# Patient Record
Sex: Male | Born: 1955
Health system: Southern US, Community
[De-identification: ages and names within clinical notes are randomized; demographics above are authoritative.]

## PROBLEM LIST (undated history)

## (undated) DIAGNOSIS — K5790 Diverticulosis of intestine, part unspecified, without perforation or abscess without bleeding: Secondary | ICD-10-CM

## (undated) DIAGNOSIS — M51369 Other intervertebral disc degeneration, lumbar region without mention of lumbar back pain or lower extremity pain: Secondary | ICD-10-CM

## (undated) DIAGNOSIS — I219 Acute myocardial infarction, unspecified: Secondary | ICD-10-CM

## (undated) DIAGNOSIS — R868 Other abnormal findings in specimens from male genital organs: Secondary | ICD-10-CM

## (undated) DIAGNOSIS — M5136 Other intervertebral disc degeneration, lumbar region: Secondary | ICD-10-CM

## (undated) DIAGNOSIS — R7303 Prediabetes: Secondary | ICD-10-CM

## (undated) DIAGNOSIS — K219 Gastro-esophageal reflux disease without esophagitis: Secondary | ICD-10-CM

## (undated) DIAGNOSIS — M1990 Unspecified osteoarthritis, unspecified site: Secondary | ICD-10-CM

## (undated) DIAGNOSIS — E785 Hyperlipidemia, unspecified: Secondary | ICD-10-CM

## (undated) DIAGNOSIS — I499 Cardiac arrhythmia, unspecified: Secondary | ICD-10-CM

## (undated) DIAGNOSIS — G4730 Sleep apnea, unspecified: Secondary | ICD-10-CM

## (undated) DIAGNOSIS — I471 Supraventricular tachycardia, unspecified: Secondary | ICD-10-CM

## (undated) HISTORY — DX: Other intervertebral disc degeneration, lumbar region without mention of lumbar back pain or lower extremity pain: M51.369

## (undated) HISTORY — PX: CARDIAC ELECTROPHYSIOLOGY STUDY AND ABLATION: SHX1294

## (undated) HISTORY — DX: Hyperlipidemia, unspecified: E78.5

## (undated) HISTORY — DX: Other abnormal findings in specimens from male genital organs: R86.8

## (undated) HISTORY — DX: Gastro-esophageal reflux disease without esophagitis: K21.9

## (undated) HISTORY — DX: Diverticulosis of intestine, part unspecified, without perforation or abscess without bleeding: K57.90

## (undated) HISTORY — PX: WISDOM TOOTH EXTRACTION: SHX21

## (undated) HISTORY — PX: BACK SURGERY: SHX140

## (undated) HISTORY — DX: Other intervertebral disc degeneration, lumbar region: M51.36

---

## 1998-02-15 ENCOUNTER — Emergency Department (HOSPITAL_COMMUNITY): Admission: EM | Admit: 1998-02-15 | Discharge: 1998-02-15 | Payer: Self-pay | Admitting: Emergency Medicine

## 1998-02-24 ENCOUNTER — Encounter: Admission: RE | Admit: 1998-02-24 | Discharge: 1998-05-25 | Payer: Self-pay | Admitting: Orthopedic Surgery

## 1998-03-17 ENCOUNTER — Encounter: Admission: RE | Admit: 1998-03-17 | Discharge: 1998-06-15 | Payer: Self-pay | Admitting: Orthopaedic Surgery

## 2011-11-02 ENCOUNTER — Emergency Department (INDEPENDENT_AMBULATORY_CARE_PROVIDER_SITE_OTHER): Payer: BC Managed Care – PPO

## 2011-11-02 ENCOUNTER — Other Ambulatory Visit: Payer: Self-pay

## 2011-11-02 ENCOUNTER — Encounter (HOSPITAL_BASED_OUTPATIENT_CLINIC_OR_DEPARTMENT_OTHER): Payer: Self-pay | Admitting: Emergency Medicine

## 2011-11-02 ENCOUNTER — Emergency Department (HOSPITAL_BASED_OUTPATIENT_CLINIC_OR_DEPARTMENT_OTHER)
Admission: EM | Admit: 2011-11-02 | Discharge: 2011-11-03 | Disposition: A | Discharge status: Home / Self Care | Payer: BC Managed Care – PPO | Attending: Emergency Medicine | Admitting: Emergency Medicine

## 2011-11-02 DIAGNOSIS — Z7982 Long term (current) use of aspirin: Secondary | ICD-10-CM | POA: Insufficient documentation

## 2011-11-02 DIAGNOSIS — R002 Palpitations: Secondary | ICD-10-CM | POA: Insufficient documentation

## 2011-11-02 DIAGNOSIS — R079 Chest pain, unspecified: Secondary | ICD-10-CM | POA: Insufficient documentation

## 2011-11-02 DIAGNOSIS — I498 Other specified cardiac arrhythmias: Secondary | ICD-10-CM | POA: Insufficient documentation

## 2011-11-02 DIAGNOSIS — R0602 Shortness of breath: Secondary | ICD-10-CM | POA: Insufficient documentation

## 2011-11-02 DIAGNOSIS — Z79899 Other long term (current) drug therapy: Secondary | ICD-10-CM | POA: Insufficient documentation

## 2011-11-02 DIAGNOSIS — I471 Supraventricular tachycardia: Secondary | ICD-10-CM

## 2011-11-02 DIAGNOSIS — R42 Dizziness and giddiness: Secondary | ICD-10-CM | POA: Insufficient documentation

## 2011-11-02 LAB — COMPREHENSIVE METABOLIC PANEL
ALT: 22 U/L (ref 0–53)
AST: 22 U/L (ref 0–37)
Alkaline Phosphatase: 115 U/L (ref 39–117)
CO2: 27 mEq/L (ref 19–32)
GFR calc Af Amer: >90 mL/min (ref >90)
Glucose, Bld: 121 mg/dL — ABNORMAL HIGH (ref 70–99)
Potassium: 3.8 mEq/L (ref 3.5–5.1)
Sodium: 139 mEq/L (ref 135–145)
Total Protein: 7.7 g/dL (ref 6.0–8.3)

## 2011-11-02 LAB — CBC
Platelets: 175 10*3/uL (ref 150–400)
RBC: 5.79 MIL/uL (ref 4.22–5.81)
WBC: 8.3 10*3/uL (ref 4.0–10.5)

## 2011-11-02 LAB — DIFFERENTIAL
Basophils Absolute: 0 10*3/uL (ref 0.0–0.1)
Lymphocytes Relative: 44 % (ref 12–46)
Lymphs Abs: 3.7 10*3/uL (ref 0.7–4.0)
Neutrophils Relative %: 42 % — ABNORMAL LOW (ref 43–77)

## 2011-11-02 LAB — TROPONIN I: Troponin I: <0.3 ng/mL (ref <0.30)

## 2011-11-02 MED ORDER — METOPROLOL TARTRATE 12.5 MG HALF TABLET: 12.5000 mg | ORAL_TABLET | ORAL | Status: DC | PRN

## 2011-11-02 NOTE — ED Notes (Signed)
Pt states that he was at home exercising when he began to have pain in his chest and felt like heart was racing, pt took [employer], tums, and 8 baby asa prior to coming to ED. Pt states that he was nauseated and diaphoretic when episode was going on but complaints have now subsided. Pt is now SR on monitor, denies any CP or SOB.

## 2011-11-02 NOTE — ED Notes (Signed)
MD at bedside. 

## 2011-11-02 NOTE — ED Notes (Signed)
After sticking pt for IV, pt stated he felt better, HR on monitor 92 now.

## 2011-11-02 NOTE — ED Provider Notes (Addendum)
History     CSN: [id number]  Arrival date & time 11/02/11  2210   First MD Initiated Contact with Patient 11/02/11 2226      Chief Complaint  Patient presents with  . Chest Pain  . Shortness of Breath    (Consider location/radiation/quality/duration/timing/severity/associated sxs/prior treatment) Patient is a 56 y.o. male presenting with palpitations. The history is provided by the patient.  Palpitations  This is a new problem. The current episode started 1 to 2 hours ago. The problem occurs constantly. The problem has not changed since onset.The problem is associated with exercise, caffeine and stress. On average, each episode lasts 1 hour. Associated symptoms include dizziness and shortness of breath. Pertinent negatives include no diaphoresis, no fever, no chest pain, no nausea and no vomiting. He has tried deep relaxation and breathing exercises for the symptoms. The treatment provided no relief. Risk factors include no known risk factors. Past medical history comments: Patient states he's had these episodes before in the past but they always resolve when he rested.    Past Medical History  Diagnosis Date  . Hyperlipemia     History reviewed. No pertinent past surgical history.  No family history on file.  History  Substance Use Topics  . Smoking status: Never Smoker   . Smokeless tobacco: Not on file  . Alcohol Use: No      Review of Systems  Constitutional: Negative for fever and diaphoresis.  Respiratory: Positive for shortness of breath.   Cardiovascular: Positive for palpitations. Negative for chest pain.  Gastrointestinal: Negative for nausea and vomiting.  Neurological: Positive for dizziness.  All other systems reviewed and are negative.    Allergies  Review of patient's allergies indicates no known allergies.  Home Medications   Current Outpatient Rx  Name Route Sig Dispense Refill  . ASPIRIN 81 MG PO TABS Oral Take 648 mg by mouth once.    .  IBUPROFEN 200 MG PO TABS Oral Take 400-600 mg by mouth every 6 (six) hours as needed. For pain    . ROSUVASTATIN CALCIUM 40 MG PO TABS Oral Take 40 mg by mouth daily.      BP 109/88  Pulse 198  Temp(Src) 97.7 F (36.5 C) (Oral)  Resp 18  Wt 248 lb (112.492 kg)  SpO2 100%  Physical Exam  Nursing note and vitals reviewed. Constitutional: He is oriented to person, place, and time. He appears well-developed and well-nourished. No distress.  HENT:  Head: Normocephalic and atraumatic.  Mouth/Throat: Oropharynx is clear and moist.  Eyes: Conjunctivae and EOM are normal. Pupils are equal, round, and reactive to light.  Neck: Normal range of motion. Neck supple.  Cardiovascular: Normal rate, regular rhythm, normal heart sounds and intact distal pulses.   No murmur heard.      When I went to examine the patient his heart rate had returned to normal at a rate of 85  Pulmonary/Chest: Effort normal and breath sounds normal. No respiratory distress. He has no wheezes. He has no rales.  Abdominal: Soft. He exhibits no distension. There is no tenderness. There is no rebound and no guarding.  Musculoskeletal: Normal range of motion. He exhibits no edema and no tenderness.  Neurological: He is alert and oriented to person, place, and time.  Skin: Skin is warm and dry. No rash noted. No erythema.  Psychiatric: He has a normal mood and affect. His behavior is normal.    ED Course  Procedures (including critical care time)  Labs Reviewed -  No data to display No results found.   Date: 11/02/2011  Rate: 85  Rhythm: normal sinus rhythm  QRS Axis: normal  Intervals: normal  ST/T Wave abnormalities: normal  Conduction Disutrbances:none  Narrative Interpretation:   Old EKG Reviewed: none available    1. SVT (supraventricular tachycardia)       MDM   Patient with palpitations today with a heart rate in the 190s and on the monitor appeared to be in SVT however when the nurse was putting  the IV and he vagaled and his symptoms resolved.  By the time EKG was done ER he converted to normal sinus rhythm. Patient he has had 2-3 episodes of this in his life but they usually would resolve by him taking deep breaths and resting. Tonight the symptoms did not resolve and not to let him to come here. Most likely these prior episodes were also SVT. Patient states he feels normal now he denies any chest pain or shortness of breath. EKG, chest x-ray, cardiac enzymes, CBC and BMP are all within normal limits. Will give patient metoprolol to take if symptoms recur and follow up with cardiology.        Gwyneth Sprout, MD 11/02/11 1610  Gwyneth Sprout, MD 11/02/11 2337

## 2011-11-02 NOTE — ED Notes (Signed)
Pt was exercising at home with sudden onset of tachycardia and shob.

## 2011-11-02 NOTE — ED Notes (Signed)
Patient transported to X-ray 

## 2012-11-10 ENCOUNTER — Telehealth: Payer: Self-pay | Admitting: *Deleted

## 2012-11-10 NOTE — Telephone Encounter (Signed)
Richard Walker's wife called for her husband and said you referred her husband to a specialist in [place] and they were wanting to know if you can refer him to someone either in [address] or [place]  Thanks [place]

## 2013-04-09 ENCOUNTER — Encounter: Payer: Self-pay | Admitting: Family Medicine

## 2013-04-09 ENCOUNTER — Ambulatory Visit (INDEPENDENT_AMBULATORY_CARE_PROVIDER_SITE_OTHER): Payer: BC Managed Care – PPO | Admitting: Family Medicine

## 2013-04-09 ENCOUNTER — Other Ambulatory Visit: Payer: Self-pay | Admitting: Family Medicine

## 2013-04-09 VITALS — BP 109/76 | HR 90 | Resp 16 | Ht 68.0 in | Wt 239.0 lb

## 2013-04-09 DIAGNOSIS — I69998 Other sequelae following unspecified cerebrovascular disease: Secondary | ICD-10-CM | POA: Insufficient documentation

## 2013-04-09 DIAGNOSIS — E559 Vitamin D deficiency, unspecified: Secondary | ICD-10-CM | POA: Insufficient documentation

## 2013-04-09 DIAGNOSIS — G4730 Sleep apnea, unspecified: Secondary | ICD-10-CM | POA: Insufficient documentation

## 2013-04-09 DIAGNOSIS — E785 Hyperlipidemia, unspecified: Secondary | ICD-10-CM

## 2013-04-09 DIAGNOSIS — K219 Gastro-esophageal reflux disease without esophagitis: Secondary | ICD-10-CM | POA: Insufficient documentation

## 2013-04-09 DIAGNOSIS — E7800 Pure hypercholesterolemia, unspecified: Secondary | ICD-10-CM | POA: Insufficient documentation

## 2013-04-09 DIAGNOSIS — R197 Diarrhea, unspecified: Secondary | ICD-10-CM

## 2013-04-09 DIAGNOSIS — R1013 Epigastric pain: Secondary | ICD-10-CM

## 2013-04-09 DIAGNOSIS — B181 Chronic viral hepatitis B without delta-agent: Secondary | ICD-10-CM | POA: Insufficient documentation

## 2013-04-09 DIAGNOSIS — R110 Nausea: Secondary | ICD-10-CM

## 2013-04-09 DIAGNOSIS — R7982 Elevated C-reactive protein (CRP): Secondary | ICD-10-CM | POA: Insufficient documentation

## 2013-04-09 DIAGNOSIS — K644 Residual hemorrhoidal skin tags: Secondary | ICD-10-CM | POA: Insufficient documentation

## 2013-04-09 LAB — LIPID PANEL
Cholesterol: 213 mg/dL — ABNORMAL HIGH (ref 0–200)
HDL: 39 mg/dL — ABNORMAL LOW (ref >39)
LDL Cholesterol: 143 mg/dL — ABNORMAL HIGH (ref 0–99)
Total CHOL/HDL Ratio: 5.5 Ratio
Triglycerides: 154 mg/dL — ABNORMAL HIGH (ref <150)
VLDL: 31 mg/dL (ref 0–40)

## 2013-04-09 LAB — CBC WITH DIFFERENTIAL/PLATELET
Basophils Absolute: 0.1 10*3/uL (ref 0.0–0.1)
Basophils Relative: 1 % (ref 0–1)
Eosinophils Absolute: 0.2 10*3/uL (ref 0.0–0.7)
Eosinophils Relative: 2 % (ref 0–5)
HCT: 47.3 % (ref 39.0–52.0)
Hemoglobin: 15.9 g/dL (ref 13.0–17.0)
Lymphocytes Relative: 23 % (ref 12–46)
Lymphs Abs: 1.8 10*3/uL (ref 0.7–4.0)
MCH: 28.9 pg (ref 26.0–34.0)
MCHC: 33.6 g/dL (ref 30.0–36.0)
MCV: 85.8 fL (ref 78.0–100.0)
Monocytes Absolute: 0.6 10*3/uL (ref 0.1–1.0)
Monocytes Relative: 8 % (ref 3–12)
Neutro Abs: 5.1 10*3/uL (ref 1.7–7.7)
Neutrophils Relative %: 66 % (ref 43–77)
Platelets: 188 10*3/uL (ref 150–400)
RBC: 5.51 MIL/uL (ref 4.22–5.81)
RDW: 13.4 % (ref 11.5–15.5)
WBC: 7.7 10*3/uL (ref 4.0–10.5)

## 2013-04-09 LAB — COMPLETE METABOLIC PANEL WITH GFR
ALT: 21 U/L (ref 0–53)
AST: 16 U/L (ref 0–37)
Albumin: 4.4 g/dL (ref 3.5–5.2)
Alkaline Phosphatase: 81 U/L (ref 39–117)
BUN: 10 mg/dL (ref 6–23)
CO2: 27 mEq/L (ref 19–32)
Calcium: 9.4 mg/dL (ref 8.4–10.5)
Chloride: 105 mEq/L (ref 96–112)
Creat: 1.02 mg/dL (ref 0.50–1.35)
GFR, Est African American: >89 mL/min
GFR, Est Non African American: 82 mL/min
Glucose, Bld: 85 mg/dL (ref 70–99)
Potassium: 4.2 mEq/L (ref 3.5–5.3)
Sodium: 138 mEq/L (ref 135–145)
Total Bilirubin: 0.7 mg/dL (ref 0.3–1.2)
Total Protein: 6.9 g/dL (ref 6.0–8.3)

## 2013-04-09 LAB — LIPASE: Lipase: 20 U/L (ref 0–75)

## 2013-04-09 MED ORDER — PROMETHAZINE HCL 12.5 MG PO TABS: ORAL_TABLET | ORAL | Status: DC

## 2013-04-09 MED ORDER — DIPHENOXYLATE-ATROPINE 2.5-0.025 MG PO TABS: ORAL_TABLET | ORAL | Status: DC

## 2013-04-09 MED ORDER — DICYCLOMINE HCL 20 MG PO TABS: ORAL_TABLET | ORAL | Status: DC

## 2013-04-09 NOTE — Patient Instructions (Addendum)
1)  Diarrhea - You most likely have a virus that will pass and you can take medications to help with this.  I would first try some Immodium (OTC) and follow the BRAT Diet.   You can take some of the Bentyl (RX) up to 4 times a day as needed to slow the gut.  If the diarrhea does not improve you can take the prescription (Lomotil).  If you don't seem better in a few day then you can collect the stool samples and return.   2)  Nausea - Take the Phenergan as directed.    Diarrhea Diarrhea is frequent loose and watery bowel movements. It can cause you to feel weak and dehydrated. Dehydration can cause you to become tired and thirsty, have a dry mouth, and have decreased urination that often is dark yellow. Diarrhea is a sign of another problem, most often an infection that will not last long. In most cases, diarrhea typically lasts 2 3 days. However, it can last longer if it is a sign of something more serious. It is important to treat your diarrhea as directed by your caregive to lessen or prevent future episodes of diarrhea. CAUSES  Some common causes include:  Gastrointestinal infections caused by viruses, bacteria, or parasites.  Food poisoning or food allergies.  Certain medicines, such as antibiotics, chemotherapy, and laxatives.  Artificial sweeteners and fructose.  Digestive disorders. HOME CARE INSTRUCTIONS  Ensure adequate fluid intake (hydration): have 1 cup (8 oz) of fluid for each diarrhea episode. Avoid fluids that contain simple sugars or sports drinks, fruit juices, whole milk products, and sodas. Your urine should be clear or pale yellow if you are drinking enough fluids. Hydrate with an oral rehydration solution that you can purchase at pharmacies, retail stores, and online. You can prepare an oral rehydration solution at home by mixing the following ingredients together:    tsp table salt.   tsp baking soda.   tsp salt substitute containing potassium chloride.  1   tablespoons sugar.  1 L (34 oz) of water.  Certain foods and beverages may increase the speed at which food moves through the gastrointestinal (GI) tract. These foods and beverages should be avoided and include:  Caffeinated and alcoholic beverages.  High-fiber foods, such as raw fruits and vegetables, nuts, seeds, and whole grain breads and cereals.  Foods and beverages sweetened with sugar alcohols, such as xylitol, sorbitol, and mannitol.  Some foods may be well tolerated and may help thicken stool including:  Starchy foods, such as rice, toast, pasta, low-sugar cereal, oatmeal, grits, baked potatoes, crackers, and bagels.  Bananas.  Applesauce.  Add probiotic-rich foods to help increase healthy bacteria in the GI tract, such as yogurt and fermented milk products.  Wash your hands well after each diarrhea episode.  Only take over-the-counter or prescription medicines as directed by your caregiver.  Take a warm bath to relieve any burning or pain from frequent diarrhea episodes. SEEK IMMEDIATE MEDICAL CARE IF:   You are unable to keep fluids down.  You have persistent vomiting.  You have blood in your stool, or your stools are black and tarry.  You do not urinate in 6 8 hours, or there is only a small amount of very dark urine.  You have abdominal pain that increases or localizes.  You have weakness, dizziness, confusion, or lightheadedness.  You have a severe headache.  Your diarrhea gets worse or does not get better.  You have a fever or persistent symptoms  for more than 2 3 days.  You have a fever and your symptoms suddenly get worse. MAKE SURE YOU:   Understand these instructions.  Will watch your condition.  Will get help right away if you are not doing well or get worse. Document Released: 07/30/2002 Document Revised: 07/26/2012 Document Reviewed: 04/16/2012 [facility] Patient Information 2014 [place], [place].   Diet for Diarrhea, Adult Frequent,  runny stools (diarrhea) may be caused or worsened by food or drink. Diarrhea may be relieved by changing your diet. Since diarrhea can last up to 7 days, it is easy for you to lose too much fluid from the body and become dehydrated. Fluids that are lost need to be replaced. Along with a modified diet, make sure you drink enough fluids to keep your urine clear or pale yellow. DIET INSTRUCTIONS  Ensure adequate fluid intake (hydration): have 1 cup (8 oz) of fluid for each diarrhea episode. Avoid fluids that contain simple sugars or sports drinks, fruit juices, whole milk products, and sodas. Your urine should be clear or pale yellow if you are drinking enough fluids. Hydrate with an oral rehydration solution that you can purchase at pharmacies, retail stores, and online. You can prepare an oral rehydration solution at home by mixing the following ingredients together:    tsp table salt.   tsp baking soda.   tsp salt substitute containing potassium chloride.  1  tablespoons sugar.  1 L (34 oz) of water.  Certain foods and beverages may increase the speed at which food moves through the gastrointestinal (GI) tract. These foods and beverages should be avoided and include:  Caffeinated and alcoholic beverages.  High-fiber foods, such as raw fruits and vegetables, nuts, seeds, and whole grain breads and cereals.  Foods and beverages sweetened with sugar alcohols, such as xylitol, sorbitol, and mannitol.  Some foods may be well tolerated and may help thicken stool including:  Starchy foods, such as rice, toast, pasta, low-sugar cereal, oatmeal, grits, baked potatoes, crackers, and bagels.   Bananas.   Applesauce.  Add probiotic-rich foods to help increase healthy bacteria in the GI tract, such as yogurt and fermented milk products. RECOMMENDED FOODS AND BEVERAGES Starches Choose foods with less than 2 g of fiber per serving.  Recommended:  White, [place], and pita breads, plain rolls,  buns, bagels. Plain muffins, matzo. Soda, saltine, or graham crackers. Pretzels, melba toast, zwieback. Cooked cereals made with water: cornmeal, farina, cream cereals. Dry cereals: refined corn, wheat, rice. Potatoes prepared any way without skins, refined macaroni, spaghetti, noodles, refined rice.  Avoid:  Bread, rolls, or crackers made with whole wheat, multi-grains, rye, bran seeds, nuts, or coconut. Corn tortillas or taco shells. Cereals containing whole grains, multi-grains, bran, coconut, nuts, raisins. Cooked or dry oatmeal. Coarse wheat cereals, granola. Cereals advertised as "high-fiber." Potato skins. Whole grain pasta, wild or brown rice. Popcorn. Sweet potatoes, yams. Sweet rolls, doughnuts, waffles, pancakes, sweet breads. Vegetables  Recommended: Strained tomato and vegetable juices. Most well-cooked and canned vegetables without seeds. Fresh: Tender lettuce, cucumber without the skin, cabbage, spinach, bean sprouts.  Avoid: Fresh, cooked, or canned: Artichokes, baked beans, beet greens, broccoli, Brussels sprouts, corn, kale, legumes, peas, sweet potatoes. Cooked: Green or red cabbage, spinach. Avoid large servings of any vegetables because vegetables shrink when cooked, and they contain more fiber per serving than fresh vegetables. Fruit  Recommended: Cooked or canned: Apricots, applesauce, cantaloupe, cherries, fruit cocktail, grapefruit, grapes, kiwi, mandarin oranges, peaches, pears, plums, watermelon. Fresh: Apples without skin, ripe  banana, grapes, cantaloupe, cherries, grapefruit, peaches, oranges, plums. Keep servings limited to  cup or 1 piece.  Avoid: Fresh: Apples with skin, apricots, mangoes, pears, raspberries, strawberries. Prune juice, stewed or dried prunes. Dried fruits, raisins, dates. Large servings of all fresh fruits. Protein  Recommended: Ground or well-cooked tender beef, ham, veal, lamb, pork, or poultry. Eggs. Fish, oysters, shrimp, lobster, other seafoods.  Liver, organ meats.  Avoid: Tough, fibrous meats with gristle. Peanut butter, smooth or chunky. Cheese, nuts, seeds, legumes, dried peas, beans, lentils. Dairy  Recommended: Yogurt, lactose-free milk, kefir, drinkable yogurt, buttermilk, soy milk, or plain hard cheese.  Avoid: Milk, chocolate milk, beverages made with milk, such as milkshakes. Soups  Recommended: Bouillon, broth, or soups made from allowed foods. Any strained soup.  Avoid: Soups made from vegetables that are not allowed, cream or milk-based soups. Desserts and Sweets  Recommended: Sugar-free gelatin, sugar-free frozen ice pops made without sugar alcohol.  Avoid: Plain cakes and cookies, pie made with fruit, pudding, custard, cream pie. Gelatin, fruit, ice, sherbet, frozen ice pops. Ice cream, ice milk without nuts. Plain hard candy, honey, jelly, molasses, syrup, sugar, chocolate syrup, gumdrops, marshmallows. Fats and Oils  Recommended: Limit fats to less than 8 tsp per day.  Avoid: Seeds, nuts, olives, avocados. Margarine, butter, cream, mayonnaise, salad oils, plain salad dressings. Plain gravy, crisp bacon without rind. Beverages  Recommended: Water, decaffeinated teas, oral rehydration solutions, sugar-free beverages not sweetened with sugar alcohols.  Avoid: Fruit juices, caffeinated beverages (coffee, tea, soda), alcohol, sports drinks, or lemon-lime soda. Condiments  Recommended: Ketchup, mustard, horseradish, vinegar, cocoa powder. Spices in moderation: allspice, basil, bay leaves, celery powder or leaves, cinnamon, cumin powder, curry powder, ginger, mace, marjoram, onion or garlic powder, oregano, paprika, parsley flakes, ground pepper, rosemary, sage, savory, tarragon, thyme, turmeric.  Avoid: Coconut, honey. Document Released: 10/30/2003 Document Revised: 05/03/2012 Document Reviewed: 12/24/2011 [facility] Patient Information 2014 [place], [place].   B.R.A.T. Diet Your doctor has recommended the  B.R.A.T. diet for you or your child until the condition improves. This is often used to help control diarrhea and vomiting symptoms. If you or your child can tolerate clear liquids, you may have:  Bananas.   Rice.   Applesauce.   Toast (and other simple starches such as crackers, potatoes, noodles).  Be sure to avoid dairy products, meats, and fatty foods until symptoms are better. Fruit juices such as apple, grape, and prune juice can make diarrhea worse. Avoid these. Continue this diet for 2 days or as instructed by your caregiver. Document Released: 08/09/2005 Document Revised: 07/29/2011 Document Reviewed: 01/26/2007 [facility] Patient Information 2012 [place], [place].

## 2013-04-09 NOTE — Progress Notes (Signed)
  Subjective:    Patient ID: Richard Walker, male    DOB: 09-May-1956, 57 y.o.   MRN: [id number]  Richard Walker is here today complaining of diarrhea.   Diarrhea  This is a new problem. The current episode started in the past 7 days. The problem occurs 5 to 10 times per day. The problem has been unchanged. The stool consistency is described as watery. The patient states that diarrhea awakens him from sleep. Associated symptoms include abdominal pain and increased flatus. Associated symptoms comments: belching. Nothing aggravates the symptoms. He has tried anti-motility drug and analgesics (home remedies (corn starch) ) for the symptoms. The treatment provided no relief.     Review of Systems  Constitutional: Negative.   HENT: Negative.   Eyes: Negative.   Respiratory: Negative.   Cardiovascular: Negative.   Gastrointestinal: Positive for abdominal pain, diarrhea and flatus.  Endocrine: Negative.   Genitourinary: Negative.   Musculoskeletal: Negative.   Skin: Negative.   Allergic/Immunologic: Negative.   Neurological: Negative.   Hematological: Negative.   Psychiatric/Behavioral: Negative.      Past Medical History  Diagnosis Date  . Hyperlipidemia   . GERD (gastroesophageal reflux disease)   . DDD (degenerative disc disease), lumbar     L3/L4     Family History  Problem Relation Age of Onset  . Hypertension Father      History   Social History Narrative   Marital Status: Married Engineer, drilling)   Children:  4 (3 Sons/1 Daughter)   Pets:  None    Living Situation: Lives with spouse and 4 children   Origin:  He was born in [place].   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     Alcohol Use:  None   Drug Use:  None   Diet:  Regular   Exercise:  None   Hobbies:  Cards       Objective:   Physical Exam  Vitals reviewed. Constitutional: He is  oriented to person, place, and time. He appears well-developed and well-nourished.  Cardiovascular: Normal rate and regular rhythm.   Pulmonary/Chest: Effort normal and breath sounds normal.  Abdominal: Soft. Bowel sounds are normal. He exhibits no distension and no mass. There is no tenderness.  Neurological: He is alert and oriented to person, place, and time.  Skin: Skin is warm and dry.  Psychiatric: He has a normal mood and affect.          Assessment & Plan:

## 2013-04-10 ENCOUNTER — Ambulatory Visit (INDEPENDENT_AMBULATORY_CARE_PROVIDER_SITE_OTHER): Payer: BC Managed Care – PPO | Admitting: Family Medicine

## 2013-04-10 ENCOUNTER — Encounter: Payer: Self-pay | Admitting: Family Medicine

## 2013-04-10 VITALS — BP 128/83 | HR 68 | Resp 16 | Wt 239.0 lb

## 2013-04-10 DIAGNOSIS — R197 Diarrhea, unspecified: Secondary | ICD-10-CM

## 2013-04-10 DIAGNOSIS — E785 Hyperlipidemia, unspecified: Secondary | ICD-10-CM

## 2013-04-10 LAB — TSH: TSH: 2.611 u[IU]/mL (ref 0.350–4.500)

## 2013-04-10 MED ORDER — ROSUVASTATIN CALCIUM 40 MG PO TABS: ORAL_TABLET | ORAL | Status: DC

## 2013-04-10 NOTE — Patient Instructions (Addendum)
1)  Cholesterol - Start with Co Q 10 100-200 mg per day for one month before you start back on the Crestor.  Then start with 5 mg of Crestor then increase to 10 mg and 20 mg then 40 mg if tolerated.  If when you are on the Crestor and you have muscle pain then we want to check a lab called CPK.     Fat and Cholesterol Control Diet Cholesterol levels in your body are determined significantly by your diet. Cholesterol levels may also be related to heart disease. The following material helps to explain this relationship and discusses what you can do to help keep your heart healthy. Not all cholesterol is bad. Low-density lipoprotein (LDL) cholesterol is the "bad" cholesterol. It may cause fatty deposits to build up inside your arteries. High-density lipoprotein (HDL) cholesterol is "good." It helps to remove the "bad" LDL cholesterol from your blood. Cholesterol is a very important risk factor for heart disease. Other risk factors are high blood pressure, smoking, stress, heredity, and weight. The heart muscle gets its supply of blood through the coronary arteries. If your LDL cholesterol is high and your HDL cholesterol is low, you are at risk for having fatty deposits build up in your coronary arteries. This leaves less room through which blood can flow. Without sufficient blood and oxygen, the heart muscle cannot function properly and you may feel chest pains (angina pectoris). When a coronary artery closes up entirely, a part of the heart muscle may die causing a heart attack (myocardial infarction). CHECKING CHOLESTEROL When your caregiver sends your blood to a lab to be examined for cholesterol, a complete lipid (fat) profile may be done. With this test, the total amount of cholesterol and levels of LDL and HDL are determined. Triglycerides are a type of fat that circulates in the blood. They can also be used to determine heart disease risk. The list below describes what the numbers should be: Test: Total  Cholesterol.  Less than 200 mg/dl. Test: LDL "bad cholesterol."  Less than 100 mg/dl.  Less than 70 mg/dl if you are at very high risk of a heart attack or sudden cardiac death. Test: HDL "good cholesterol."  Greater than 50 mg/dl for women.  Greater than 40 mg/dl for men. Test: Triglycerides.  Less than 150 mg/dl. CONTROLLING CHOLESTEROL WITH DIET Although exercise and lifestyle factors are important, your diet is key. That is because certain foods are known to raise cholesterol and others to lower it. The goal is to balance foods for their effect on cholesterol and more importantly, to replace saturated and trans fat with other types of fat, such as monounsaturated fat, polyunsaturated fat, and omega-3 fatty acids. On average, a person should consume no more than 15 to 17 g of saturated fat daily. Saturated and trans fats are considered "bad" fats, and they will raise LDL cholesterol. Saturated fats are primarily found in animal products such as meats, butter, and cream. However, that does not mean you need to give up all your favorite foods. Today, there are good tasting, low-fat, low-cholesterol substitutes for most of the things you like to eat. Choose low-fat or nonfat alternatives. Choose round or loin cuts of red meat. These types of cuts are lowest in fat and cholesterol. Chicken (without the skin), fish, veal, and ground [place] breast are great choices. Eliminate fatty meats, such as hot dogs and salami. Even shellfish have little or no saturated fat. Have a 3 oz (85 g) portion when  you eat lean meat, poultry, or fish. Trans fats are also called "partially hydrogenated oils." They are oils that have been scientifically manipulated so that they are solid at room temperature resulting in a longer shelf life and improved taste and texture of foods in which they are added. Trans fats are found in stick margarine, some tub margarines, cookies, crackers, and baked goods.  When baking and  cooking, oils are a great substitute for butter. The monounsaturated oils are especially beneficial since it is believed they lower LDL and raise HDL. The oils you should avoid entirely are saturated tropical oils, such as coconut and palm.  Remember to eat a lot from food groups that are naturally free of saturated and trans fat, including fish, fruit, vegetables, beans, grains (barley, rice, couscous, bulgur wheat), and pasta (without cream sauces).  IDENTIFYING FOODS THAT LOWER CHOLESTEROL  Soluble fiber may lower your cholesterol. This type of fiber is found in fruits such as apples, vegetables such as broccoli, potatoes, and carrots, legumes such as beans, peas, and lentils, and grains such as barley. Foods fortified with plant sterols (phytosterol) may also lower cholesterol. You should eat at least 2 g per day of these foods for a cholesterol lowering effect.  Read package labels to identify low-saturated fats, trans fat free, and low-fat foods at the supermarket. Select cheeses that have only 2 to 3 g saturated fat per ounce. Use a heart-healthy tub margarine that is free of trans fats or partially hydrogenated oil. When buying baked goods (cookies, crackers), avoid partially hydrogenated oils. Breads and muffins should be made from whole grains (whole-wheat or whole oat flour, instead of "flour" or "enriched flour"). Buy non-creamy canned soups with reduced salt and no added fats.  FOOD PREPARATION TECHNIQUES  Never deep-fry. If you must fry, either stir-fry, which uses very little fat, or use non-stick cooking sprays. When possible, broil, bake, or roast meats, and steam vegetables. Instead of putting butter or margarine on vegetables, use lemon and herbs, applesauce, and cinnamon (for squash and sweet potatoes), nonfat yogurt, salsa, and low-fat dressings for salads.  LOW-SATURATED FAT / LOW-FAT FOOD SUBSTITUTES Meats / Saturated Fat (g)  Avoid: Steak, marbled (3 oz/85 g) / 11 g  Choose:  Steak, lean (3 oz/85 g) / 4 g  Avoid: Hamburger (3 oz/85 g) / 7 g  Choose: Hamburger, lean (3 oz/85 g) / 5 g  Avoid: Ham (3 oz/85 g) / 6 g  Choose: Ham, lean cut (3 oz/85 g) / 2.4 g  Avoid: Chicken, with skin, dark meat (3 oz/85 g) / 4 g  Choose: Chicken, skin removed, dark meat (3 oz/85 g) / 2 g  Avoid: Chicken, with skin, light meat (3 oz/85 g) / 2.5 g  Choose: Chicken, skin removed, light meat (3 oz/85 g) / 1 g Dairy / Saturated Fat (g)  Avoid: Whole milk (1 cup) / 5 g  Choose: Low-fat milk, 2% (1 cup) / 3 g  Choose: Low-fat milk, 1% (1 cup) / 1.5 g  Choose: Skim milk (1 cup) / 0.3 g  Avoid: Hard cheese (1 oz/28 g) / 6 g  Choose: Skim milk cheese (1 oz/28 g) / 2 to 3 g  Avoid: Cottage cheese, 4% fat (1 cup) / 6.5 g  Choose: Low-fat cottage cheese, 1% fat (1 cup) / 1.5 g  Avoid: Ice cream (1 cup) / 9 g  Choose: Sherbet (1 cup) / 2.5 g  Choose: Nonfat frozen yogurt (1 cup) / 0.3 g  Choose: Frozen fruit bar / trace  Avoid: Whipped cream (1 tbs) / 3.5 g  Choose: Nondairy whipped topping (1 tbs) / 1 g Condiments / Saturated Fat (g)  Avoid: Mayonnaise (1 tbs) / 2 g  Choose: Low-fat mayonnaise (1 tbs) / 1 g  Avoid: Butter (1 tbs) / 7 g  Choose: Extra light margarine (1 tbs) / 1 g  Avoid: Coconut oil (1 tbs) / 11.8 g  Choose: Olive oil (1 tbs) / 1.8 g  Choose: Corn oil (1 tbs) / 1.7 g  Choose: Safflower oil (1 tbs) / 1.2 g  Choose: Sunflower oil (1 tbs) / 1.4 g  Choose: Soybean oil (1 tbs) / 2.4 g  Choose: Canola oil (1 tbs) / 1 g Document Released: 08/09/2005 Document Revised: 11/01/2011 Document Reviewed: 01/28/2011 ExitCare Patient Information 2014 [place], [place].

## 2013-04-10 NOTE — Progress Notes (Signed)
  Subjective:    Patient ID: Richard Walker, male    DOB: 03-01-1956, 57 y.o.   MRN: [id number]  HPI  Richard Walker is here today to go over his recent lab results and to discuss the conditions below:   1)  Hyperlipidemia: He has not been taking his Crestor.    2)  Diarrhea:  His diarrhea has improved.    Review of Systems  Constitutional: Positive for fatigue.       He has been feeling very tired  HENT: Negative.   Eyes: Negative.   Respiratory: Negative.   Cardiovascular: Negative.   Gastrointestinal: Negative.   Endocrine: Negative.   Genitourinary: Negative.   Musculoskeletal: Negative.   Skin: Negative.   Allergic/Immunologic: Negative.   Neurological: Negative.   Hematological: Negative.   Psychiatric/Behavioral: Negative.      Past Medical History  Diagnosis Date  . Hyperlipidemia   . GERD (gastroesophageal reflux disease)   . DDD (degenerative disc disease), lumbar     L3/L4     Family History  Problem Relation Age of Onset  . Hypertension Father      History   Social History Narrative   Marital Status: Married Engineer, drilling)   Children:  4 (3 Sons/1 Daughter)   Pets:  None    Living Situation: Lives with spouse and 4 children   Origin:  He was born in [place].   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     Alcohol Use:  None   Drug Use:  None   Diet:  Regular   Exercise:  None   Hobbies:  Cards       Objective:   Physical Exam  Vitals reviewed. Constitutional: He is oriented to person, place, and time. He appears well-developed and well-nourished.  Cardiovascular: Normal rate and regular rhythm.   Pulmonary/Chest: Effort normal and breath sounds normal.  Musculoskeletal: Normal range of motion.  Neurological: He is alert and oriented to person, place, and time.  Skin: Skin is warm and dry.  Psychiatric: He has a normal  mood and affect.      Assessment & Plan:

## 2013-05-25 ENCOUNTER — Encounter: Payer: Self-pay | Admitting: Family Medicine

## 2013-06-03 DIAGNOSIS — R197 Diarrhea, unspecified: Secondary | ICD-10-CM | POA: Insufficient documentation

## 2013-06-03 DIAGNOSIS — E785 Hyperlipidemia, unspecified: Secondary | ICD-10-CM | POA: Insufficient documentation

## 2013-06-03 DIAGNOSIS — R1013 Epigastric pain: Secondary | ICD-10-CM | POA: Insufficient documentation

## 2013-06-03 DIAGNOSIS — R110 Nausea: Secondary | ICD-10-CM | POA: Insufficient documentation

## 2013-06-03 NOTE — Assessment & Plan Note (Signed)
Checking a lipid panel.  

## 2013-06-03 NOTE — Assessment & Plan Note (Signed)
Improved

## 2013-06-03 NOTE — Assessment & Plan Note (Signed)
He was given a prescription for Bentyl.

## 2013-06-03 NOTE — Assessment & Plan Note (Signed)
He was given a prescription for Lomotil.

## 2013-06-03 NOTE — Assessment & Plan Note (Signed)
Refilled his Crestor.   

## 2013-06-03 NOTE — Assessment & Plan Note (Signed)
He was given a prescription for Phenergan.

## 2013-06-06 ENCOUNTER — Emergency Department (HOSPITAL_BASED_OUTPATIENT_CLINIC_OR_DEPARTMENT_OTHER)
Admission: EM | Admit: 2013-06-06 | Discharge: 2013-06-06 | Disposition: A | Discharge status: Home / Self Care | Payer: BC Managed Care – PPO | Attending: Emergency Medicine | Admitting: Emergency Medicine

## 2013-06-06 ENCOUNTER — Encounter (HOSPITAL_BASED_OUTPATIENT_CLINIC_OR_DEPARTMENT_OTHER): Payer: Self-pay | Admitting: Emergency Medicine

## 2013-06-06 DIAGNOSIS — R42 Dizziness and giddiness: Secondary | ICD-10-CM | POA: Insufficient documentation

## 2013-06-06 DIAGNOSIS — R112 Nausea with vomiting, unspecified: Secondary | ICD-10-CM | POA: Insufficient documentation

## 2013-06-06 DIAGNOSIS — R109 Unspecified abdominal pain: Secondary | ICD-10-CM

## 2013-06-06 DIAGNOSIS — Z87891 Personal history of nicotine dependence: Secondary | ICD-10-CM | POA: Insufficient documentation

## 2013-06-06 DIAGNOSIS — E785 Hyperlipidemia, unspecified: Secondary | ICD-10-CM | POA: Insufficient documentation

## 2013-06-06 DIAGNOSIS — K219 Gastro-esophageal reflux disease without esophagitis: Secondary | ICD-10-CM | POA: Insufficient documentation

## 2013-06-06 DIAGNOSIS — Z8739 Personal history of other diseases of the musculoskeletal system and connective tissue: Secondary | ICD-10-CM | POA: Insufficient documentation

## 2013-06-06 DIAGNOSIS — Z79899 Other long term (current) drug therapy: Secondary | ICD-10-CM | POA: Insufficient documentation

## 2013-06-06 DIAGNOSIS — R1013 Epigastric pain: Secondary | ICD-10-CM | POA: Insufficient documentation

## 2013-06-06 LAB — CBC WITH DIFFERENTIAL/PLATELET
Basophils Absolute: 0 10*3/uL (ref 0.0–0.1)
Eosinophils Relative: 1 % (ref 0–5)
HCT: 47.8 % (ref 39.0–52.0)
Lymphocytes Relative: 17 % (ref 12–46)
Lymphs Abs: 1.2 10*3/uL (ref 0.7–4.0)
MCV: 85.2 fL (ref 78.0–100.0)
Monocytes Absolute: 0.3 10*3/uL (ref 0.1–1.0)
Neutro Abs: 5.8 10*3/uL (ref 1.7–7.7)
RBC: 5.61 MIL/uL (ref 4.22–5.81)
RDW: 13.4 % (ref 11.5–15.5)
WBC: 7.4 10*3/uL (ref 4.0–10.5)

## 2013-06-06 LAB — URINALYSIS, ROUTINE W REFLEX MICROSCOPIC
Bilirubin Urine: NEGATIVE
Glucose, UA: NEGATIVE mg/dL
Hgb urine dipstick: NEGATIVE
Ketones, ur: 15 mg/dL — AB
Protein, ur: NEGATIVE mg/dL
Urobilinogen, UA: 0.2 mg/dL (ref 0.0–1.0)

## 2013-06-06 LAB — COMPREHENSIVE METABOLIC PANEL
ALT: 26 U/L (ref 0–53)
AST: 20 U/L (ref 0–37)
CO2: 29 mEq/L (ref 19–32)
Calcium: 10.3 mg/dL (ref 8.4–10.5)
Chloride: 99 mEq/L (ref 96–112)
GFR calc Af Amer: >90 mL/min (ref >90)
GFR calc non Af Amer: >90 mL/min (ref >90)
Glucose, Bld: 127 mg/dL — ABNORMAL HIGH (ref 70–99)
Sodium: 136 mEq/L (ref 135–145)
Total Bilirubin: 0.6 mg/dL (ref 0.3–1.2)

## 2013-06-06 LAB — TROPONIN I: Troponin I: <0.3 ng/mL (ref <0.30)

## 2013-06-06 MED ORDER — TRAMADOL HCL 50 MG PO TABS: 50.0000 mg | ORAL_TABLET | Freq: Four times a day (QID) | ORAL | Status: DC | PRN

## 2013-06-06 MED ORDER — MORPHINE SULFATE 4 MG/ML IJ SOLN
4.0000 mg | Freq: Once | INTRAMUSCULAR | Status: AC
  Administered 2013-06-06: 4 mg via INTRAVENOUS
  Filled 2013-06-06: qty 1

## 2013-06-06 MED ORDER — ONDANSETRON HCL 4 MG/2ML IJ SOLN
4.0000 mg | Freq: Once | INTRAMUSCULAR | Status: AC
  Administered 2013-06-06: 4 mg via INTRAVENOUS
  Filled 2013-06-06: qty 2

## 2013-06-06 MED ORDER — MECLIZINE HCL 50 MG PO TABS: 50.0000 mg | ORAL_TABLET | Freq: Three times a day (TID) | ORAL | Status: DC | PRN

## 2013-06-06 MED ORDER — ONDANSETRON 4 MG PO TBDP: 4.0000 mg | ORAL_TABLET | Freq: Three times a day (TID) | ORAL | Status: DC | PRN

## 2013-06-06 MED ORDER — SODIUM CHLORIDE 0.9 % IV BOLUS (SEPSIS)
1000.0000 mL | Freq: Once | INTRAVENOUS | Status: AC
  Administered 2013-06-06: 1000 mL via INTRAVENOUS

## 2013-06-06 NOTE — ED Notes (Signed)
Pt reports abdominal pain and nausea that started this morning.  Denies fever, denies vomiting or diarrhea.   Pt ambulatory.  No acute distress noted.  Denies urinary symptoms.

## 2013-06-06 NOTE — ED Provider Notes (Signed)
CSN: [id number]     Arrival date & time 06/06/13  1300 History   First MD Initiated Contact with Patient 06/06/13 1312     Chief Complaint  Patient presents with  . Abdominal Pain   (Consider location/radiation/quality/duration/timing/severity/associated sxs/prior Treatment) HPI Comments: Patient is a 57 year old male with a past medical history of hyperlipidemia and GERD who presents with abdominal pain that started last night. The pain is located in his epigastrium and does not radiate. The pain is described as aching and moderate. The pain started gradually and progressively worsened since the onset. No alleviating/aggravating factors. The patient has tried nothing for symptoms without relief. Associated symptoms include nausea and vomiting. Patient denies fever, headache, diarrhea, chest pain, SOB, dysuria, constipation.   Patient is a 57 y.o. male presenting with abdominal pain.  Abdominal Pain Associated symptoms: nausea and vomiting     Past Medical History  Diagnosis Date  . Hyperlipidemia   . GERD (gastroesophageal reflux disease)   . DDD (degenerative disc disease), lumbar     L3/L4   History reviewed. No pertinent past surgical history. Family History  Problem Relation Age of Onset  . Hypertension Father    History  Substance Use Topics  . Smoking status: Former Games developer  . Smokeless tobacco: Current User     Comment: Uses Hookah Pipe occasionally  . Alcohol Use: No    Review of Systems  Gastrointestinal: Positive for nausea, vomiting and abdominal pain.  Neurological: Positive for light-headedness.  All other systems reviewed and are negative.    Allergies  Review of patient's allergies indicates no known allergies.  Home Medications   Current Outpatient Rx  Name  Route  Sig  Dispense  Refill  . esomeprazole (NEXIUM) 40 MG capsule   Oral   Take 40 mg by mouth daily before breakfast.         . ibuprofen (ADVIL,MOTRIN) 200 MG tablet   Oral   Take  400-600 mg by mouth every 6 (six) hours as needed. For pain         . rosuvastatin (CRESTOR) 40 MG tablet      Take 1/2 - 1 tab daily   30 tablet   5    BP 134/99  Pulse 68  Temp(Src) 97.5 F (36.4 C) (Oral)  Resp 16  Ht 5\' 7"  (1.702 m)  Wt 240 lb (108.863 kg)  BMI 37.58 kg/m2  SpO2 98% Physical Exam  Nursing note and vitals reviewed. Constitutional: He appears well-developed and well-nourished. No distress.  HENT:  Head: Normocephalic and atraumatic.  Eyes: Conjunctivae and EOM are normal. Pupils are equal, round, and reactive to light.  Neck: Normal range of motion.  Cardiovascular: Normal rate and regular rhythm.  Exam reveals no gallop and no friction rub.   No murmur heard. Pulmonary/Chest: Effort normal and breath sounds normal. He has no wheezes. He has no rales. He exhibits no tenderness.  Abdominal: Soft. He exhibits no distension. There is tenderness. There is no rebound and no guarding.  Mild epigastric tenderness to palpation. No peritoneal signs or other focal tenderness to palpation.   Musculoskeletal: Normal range of motion.  Neurological: He is alert.  Speech is goal-oriented. Moves limbs without ataxia.   Skin: Skin is warm and dry.  Psychiatric: He has a normal mood and affect. His behavior is normal.    ED Course  Procedures (including critical care time)   Date: 06/06/2013  Rate: 63  Rhythm: normal sinus rhythm  QRS Axis:  normal  Intervals: normal  ST/T Wave abnormalities: normal  Conduction Disutrbances:none  Narrative Interpretation: NSR   Old EKG Reviewed: unchanged    Labs Review Labs Reviewed  CBC WITH DIFFERENTIAL - Abnormal; Notable for the following:    Neutrophils Relative % 79 (*)    All other components within normal limits  COMPREHENSIVE METABOLIC PANEL - Abnormal; Notable for the following:    Glucose, Bld 127 (*)    All other components within normal limits  URINALYSIS, ROUTINE W REFLEX MICROSCOPIC - Abnormal; Notable for  the following:    APPearance CLOUDY (*)    Ketones, ur 15 (*)    All other components within normal limits  URINE CULTURE  LIPASE, BLOOD  TROPONIN I   Imaging Review No results found.  EKG Interpretation   None       MDM   1. Lightheadedness   2. Abdominal pain     1:43 PM Labs and urinalysis pending. Vitals stable and patient afebrile. Patient will have fluids, morphine and zofran for symptoms.  5:23 PM Labs and urinalysis unremarkable for acute changes. No neuro deficits noted. Patient has no focal abdominal tenderness at the time. He states he is feeling better but still having some lightheadedness. Patient will be discharged with Meclizine and Tramadol for pain and zofran for nausea. I doubt acute infectious process at this time. Vitals stable and patient afebrile. Patient instructed to return to the ED with worsening or concerning symptoms.     Emilia Beck, PA-C 06/06/13 1735

## 2013-06-06 NOTE — ED Notes (Signed)
Pt reminded about urine specimen. 

## 2013-06-06 NOTE — ED Notes (Signed)
Patient aware of needed urine sample, unable to void at this time.

## 2013-06-07 LAB — URINE CULTURE: Special Requests: NORMAL

## 2013-06-08 NOTE — ED Provider Notes (Signed)
  Medical screening examination/treatment/procedure(s) were performed by non-physician practitioner and as supervising physician I was immediately available for consultation/collaboration.    Gerhard Munch, MD 06/08/13 1620

## 2014-03-04 ENCOUNTER — Other Ambulatory Visit: Payer: Self-pay | Admitting: *Deleted

## 2014-03-04 DIAGNOSIS — E785 Hyperlipidemia, unspecified: Secondary | ICD-10-CM

## 2014-03-04 DIAGNOSIS — Z0000 Encounter for general adult medical examination without abnormal findings: Secondary | ICD-10-CM

## 2014-03-05 ENCOUNTER — Other Ambulatory Visit: Payer: BC Managed Care – PPO

## 2014-03-05 LAB — CBC WITH DIFFERENTIAL/PLATELET
Basophils Absolute: 0 10*3/uL (ref 0.0–0.1)
Basophils Relative: 0 % (ref 0–1)
Eosinophils Absolute: 0.1 10*3/uL (ref 0.0–0.7)
Eosinophils Relative: 2 % (ref 0–5)
HCT: 44.7 % (ref 39.0–52.0)
Hemoglobin: 15.6 g/dL (ref 13.0–17.0)
Lymphocytes Relative: 27 % (ref 12–46)
Lymphs Abs: 1.6 10*3/uL (ref 0.7–4.0)
MCH: 29.2 pg (ref 26.0–34.0)
MCHC: 34.9 g/dL (ref 30.0–36.0)
MCV: 83.7 fL (ref 78.0–100.0)
Monocytes Absolute: 0.5 10*3/uL (ref 0.1–1.0)
Monocytes Relative: 8 % (ref 3–12)
Neutro Abs: 3.8 10*3/uL (ref 1.7–7.7)
Neutrophils Relative %: 63 % (ref 43–77)
Platelets: 187 10*3/uL (ref 150–400)
RBC: 5.34 MIL/uL (ref 4.22–5.81)
RDW: 13.7 % (ref 11.5–15.5)
WBC: 6 10*3/uL (ref 4.0–10.5)

## 2014-03-05 LAB — TSH: TSH: 1.417 u[IU]/mL (ref 0.350–4.500)

## 2014-03-06 LAB — COMPLETE METABOLIC PANEL WITH GFR
ALT: 18 U/L (ref 0–53)
AST: 15 U/L (ref 0–37)
Albumin: 4.3 g/dL (ref 3.5–5.2)
Alkaline Phosphatase: 71 U/L (ref 39–117)
BUN: 16 mg/dL (ref 6–23)
CO2: 26 mEq/L (ref 19–32)
Calcium: 9.1 mg/dL (ref 8.4–10.5)
Chloride: 104 mEq/L (ref 96–112)
Creat: 0.94 mg/dL (ref 0.50–1.35)
GFR, Est African American: >89 mL/min
GFR, Est Non African American: >89 mL/min
Glucose, Bld: 91 mg/dL (ref 70–99)
Potassium: 4.3 mEq/L (ref 3.5–5.3)
Sodium: 139 mEq/L (ref 135–145)
Total Bilirubin: 0.7 mg/dL (ref 0.2–1.2)
Total Protein: 6.6 g/dL (ref 6.0–8.3)

## 2014-03-06 LAB — LIPID PANEL
Cholesterol: 228 mg/dL — ABNORMAL HIGH (ref 0–200)
HDL: 37 mg/dL — ABNORMAL LOW (ref >39)
LDL Cholesterol: 162 mg/dL — ABNORMAL HIGH (ref 0–99)
Total CHOL/HDL Ratio: 6.2 Ratio
Triglycerides: 147 mg/dL (ref <150)
VLDL: 29 mg/dL (ref 0–40)

## 2014-03-06 LAB — VITAMIN D 25 HYDROXY (VIT D DEFICIENCY, FRACTURES): Vit D, 25-Hydroxy: 26 ng/mL — ABNORMAL LOW (ref 30–89)

## 2014-03-29 ENCOUNTER — Ambulatory Visit (INDEPENDENT_AMBULATORY_CARE_PROVIDER_SITE_OTHER): Payer: BC Managed Care – PPO | Admitting: Family Medicine

## 2014-03-29 ENCOUNTER — Encounter: Payer: Self-pay | Admitting: Family Medicine

## 2014-03-29 VITALS — BP 143/85 | HR 71 | Resp 16 | Ht 68.0 in | Wt 238.0 lb

## 2014-03-29 DIAGNOSIS — E785 Hyperlipidemia, unspecified: Secondary | ICD-10-CM

## 2014-03-29 DIAGNOSIS — R140 Abdominal distension (gaseous): Secondary | ICD-10-CM

## 2014-03-29 DIAGNOSIS — K219 Gastro-esophageal reflux disease without esophagitis: Secondary | ICD-10-CM

## 2014-03-29 DIAGNOSIS — R142 Eructation: Secondary | ICD-10-CM

## 2014-03-29 DIAGNOSIS — R143 Flatulence: Secondary | ICD-10-CM

## 2014-03-29 DIAGNOSIS — R141 Gas pain: Secondary | ICD-10-CM

## 2014-03-29 DIAGNOSIS — E559 Vitamin D deficiency, unspecified: Secondary | ICD-10-CM

## 2014-03-29 MED ORDER — ESOMEPRAZOLE MAGNESIUM 40 MG PO CPDR: 40.0000 mg | DELAYED_RELEASE_CAPSULE | Freq: Every day | ORAL | Status: DC

## 2014-03-29 MED ORDER — VITAMIN D (ERGOCALCIFEROL) 1.25 MG (50000 UNIT) PO CAPS: ORAL_CAPSULE | ORAL | Status: AC

## 2014-03-29 NOTE — Progress Notes (Signed)
   Subjective:    Patient ID: Richard Walker, male    DOB: Apr 24, 1956, 58 y.o.   MRN: [id number]  HPI  Richard Walker is here today to go over his recent lab results. He is no longer taking the Crestor because he feels that it caused him to have muscle pain. He is needing to get a refill on the Nexium.    Review of Systems  Constitutional: Negative for activity change, appetite change and fatigue.  Cardiovascular: Negative for chest pain, palpitations and leg swelling.  Psychiatric/Behavioral: Negative for behavioral problems. The patient is not nervous/anxious.   All other systems reviewed and are negative.    Past Medical History  Diagnosis Date  . Hyperlipidemia   . GERD (gastroesophageal reflux disease)   . DDD (degenerative disc disease), lumbar     L3/L4  . Diverticulosis   . 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     Alcohol Use:  None   Drug Use:  None   Diet:  Regular   Exercise:  None   Hobbies:  Cards     Family History  Problem Relation Age of Onset  . Hypertension Father      Current Outpatient Prescriptions on File Prior to Visit  Medication Sig Dispense Refill  . ibuprofen (ADVIL,MOTRIN) 200 MG tablet Take 400-600 mg by mouth every 6 (six) hours as needed. For pain       No current facility-administered medications on file prior to visit.     No Known Allergies   Immunization History  Administered Date(s) Administered  . Td 09/15/2001  . Tdap 06/16/2007  . Zoster 04/19/2012         Objective:   Physical Exam  Vitals reviewed. Constitutional: He is oriented to person, place, and  time. He appears well-developed and well-nourished.  Cardiovascular: Normal rate and regular rhythm.   Pulmonary/Chest: Effort normal and breath sounds normal.  Musculoskeletal: Normal range of motion.  Neurological: He is alert and oriented to person, place, and time.  Skin: Skin is warm and dry.  Psychiatric: He has a normal mood and affect.      Assessment & Plan:    Doreatha MartinSam was seen today for medication management.  Diagnoses and associated orders for this visit:  Other and unspecified hyperlipidemia Comments: His cholesterol is elevated.  He is going to try some Livalo samples once we get to our new office.    Gastroesophageal reflux disease without esophagitis - esomeprazole (NEXIUM) 40 MG capsule; Take 1 capsule (40 mg total) by mouth daily before breakfast.  Unspecified vitamin D deficiency - Vitamin D, Ergocalciferol, (DRISDOL) 50000 UNITS CAPS capsule; Take 1 capsule po on M/W/F  Abdominal bloating Comments: He is going to try taking a probiotic like Align.     TIME SPENT "FACE TO FACE" WITH PATIENT -  30 MINS

## 2014-03-29 NOTE — Patient Instructions (Signed)
1)  GERD - Nexium +/- GI Cocktail   2)  Gas/Bloating - Take a probiotic like Align daily for a couple of months; Gas-X   3)   Cholesterol - We'll try you on Livalo at my new office.   4)   Diverticulosis - Eat high fiber diet.    5)  Vitamin D  - Get back on it twice a week   Diverticulosis Diverticulosis is the condition that develops when small pouches (diverticula) form in the wall of your colon. Your colon, or large intestine, is where water is absorbed and stool is formed. The pouches form when the inside layer of your colon pushes through weak spots in the outer layers of your colon. CAUSES  No one knows exactly what causes diverticulosis. RISK FACTORS  Being older than 50. Your risk for this condition increases with age. Diverticulosis is rare in people younger than 40 years. By age 58, almost everyone has it.  Eating a low-fiber diet.  Being frequently constipated.  Being overweight.  Not getting enough exercise.  Smoking.  Taking over-the-counter pain medicines, like aspirin and ibuprofen. SYMPTOMS  Most people with diverticulosis do not have symptoms. DIAGNOSIS  Because diverticulosis often has no symptoms, health care providers often discover the condition during an exam for other colon problems. In many cases, a health care provider will diagnose diverticulosis while using a flexible scope to examine the colon (colonoscopy). TREATMENT  If you have never developed an infection related to diverticulosis, you may not need treatment. If you have had an infection before, treatment may include:  Eating more fruits, vegetables, and grains.  Taking a fiber supplement.  Taking a live bacteria supplement (probiotic).  Taking medicine to relax your colon. HOME CARE INSTRUCTIONS   Drink at least 6-8 glasses of water each day to prevent constipation.  Try not to strain when you have a bowel movement.  Keep all follow-up appointments. If you have had an infection  before:  Increase the fiber in your diet as directed by your health care provider or dietitian.  Take a dietary fiber supplement if your health care provider approves.  Only take medicines as directed by your health care provider. SEEK MEDICAL CARE IF:   You have abdominal pain.  You have bloating.  You have cramps.  You have not gone to the bathroom in 3 days. SEEK IMMEDIATE MEDICAL CARE IF:   Your pain gets worse.  Yourbloating becomes very bad.  You have a fever or chills, and your symptoms suddenly get worse.  You begin vomiting.  You have bowel movements that are bloody or black. MAKE SURE YOU:  Understand these instructions.  Will watch your condition.  Will get help right away if you are not doing well or get worse. Document Released: 05/06/2004 Document Revised: 08/14/2013 Document Reviewed: 07/04/2013 [facility] Patient Information 2015 [place], [place]. This information is not intended to replace advice given to you by your health care provider. Make sure you discuss any questions you have with your health care provider.

## 2015-03-31 ENCOUNTER — Emergency Department (HOSPITAL_BASED_OUTPATIENT_CLINIC_OR_DEPARTMENT_OTHER)
Admission: EM | Admit: 2015-03-31 | Discharge: 2015-03-31 | Disposition: A | Discharge status: Home / Self Care | Payer: BLUE CROSS/BLUE SHIELD | Attending: Emergency Medicine | Admitting: Emergency Medicine

## 2015-03-31 ENCOUNTER — Telehealth: Payer: Self-pay | Admitting: Interventional Cardiology

## 2015-03-31 ENCOUNTER — Encounter (HOSPITAL_BASED_OUTPATIENT_CLINIC_OR_DEPARTMENT_OTHER): Payer: Self-pay | Admitting: *Deleted

## 2015-03-31 DIAGNOSIS — R7989 Other specified abnormal findings of blood chemistry: Secondary | ICD-10-CM | POA: Diagnosis not present

## 2015-03-31 DIAGNOSIS — Z8639 Personal history of other endocrine, nutritional and metabolic disease: Secondary | ICD-10-CM | POA: Insufficient documentation

## 2015-03-31 DIAGNOSIS — Z79899 Other long term (current) drug therapy: Secondary | ICD-10-CM | POA: Diagnosis not present

## 2015-03-31 DIAGNOSIS — R61 Generalized hyperhidrosis: Secondary | ICD-10-CM | POA: Diagnosis not present

## 2015-03-31 DIAGNOSIS — I471 Supraventricular tachycardia: Secondary | ICD-10-CM | POA: Diagnosis not present

## 2015-03-31 DIAGNOSIS — Z87891 Personal history of nicotine dependence: Secondary | ICD-10-CM | POA: Insufficient documentation

## 2015-03-31 DIAGNOSIS — Z8739 Personal history of other diseases of the musculoskeletal system and connective tissue: Secondary | ICD-10-CM | POA: Insufficient documentation

## 2015-03-31 DIAGNOSIS — R778 Other specified abnormalities of plasma proteins: Secondary | ICD-10-CM

## 2015-03-31 DIAGNOSIS — K219 Gastro-esophageal reflux disease without esophagitis: Secondary | ICD-10-CM | POA: Diagnosis not present

## 2015-03-31 DIAGNOSIS — R079 Chest pain, unspecified: Secondary | ICD-10-CM | POA: Diagnosis present

## 2015-03-31 HISTORY — DX: Supraventricular tachycardia, unspecified: I47.10

## 2015-03-31 HISTORY — DX: Supraventricular tachycardia: I47.1

## 2015-03-31 LAB — CBC
HCT: 51.7 % (ref 39.0–52.0)
Hemoglobin: 17.5 g/dL — ABNORMAL HIGH (ref 13.0–17.0)
MCH: 29 pg (ref 26.0–34.0)
MCHC: 33.8 g/dL (ref 30.0–36.0)
MCV: 85.7 fL (ref 78.0–100.0)
PLATELETS: 217 10*3/uL (ref 150–400)
RBC: 6.03 MIL/uL — ABNORMAL HIGH (ref 4.22–5.81)
RDW: 13.8 % (ref 11.5–15.5)
WBC: 8.1 10*3/uL (ref 4.0–10.5)

## 2015-03-31 LAB — COMPREHENSIVE METABOLIC PANEL
ALT: 25 U/L (ref 17–63)
AST: 21 U/L (ref 15–41)
Albumin: 4.4 g/dL (ref 3.5–5.0)
Alkaline Phosphatase: 90 U/L (ref 38–126)
Anion gap: 12 (ref 5–15)
BILIRUBIN TOTAL: 0.7 mg/dL (ref 0.3–1.2)
BUN: 21 mg/dL — ABNORMAL HIGH (ref 6–20)
CO2: 25 mmol/L (ref 22–32)
Calcium: 9.5 mg/dL (ref 8.9–10.3)
Chloride: 101 mmol/L (ref 101–111)
Creatinine, Ser: 1.34 mg/dL — ABNORMAL HIGH (ref 0.61–1.24)
GFR calc non Af Amer: 57 mL/min — ABNORMAL LOW (ref >60)
Glucose, Bld: 124 mg/dL — ABNORMAL HIGH (ref 65–99)
Potassium: 3.6 mmol/L (ref 3.5–5.1)
Sodium: 138 mmol/L (ref 135–145)
Total Protein: 7.7 g/dL (ref 6.5–8.1)

## 2015-03-31 LAB — TROPONIN I
Troponin I: <0.03 ng/mL (ref <0.031)
Troponin I: 0.19 ng/mL — ABNORMAL HIGH (ref <0.031)

## 2015-03-31 MED ORDER — ADENOSINE 6 MG/2ML IV SOLN
INTRAVENOUS | Status: AC
  Filled 2015-03-31: qty 2

## 2015-03-31 MED ORDER — METOPROLOL TARTRATE 25 MG PO TABS: 25.0000 mg | ORAL_TABLET | Freq: Two times a day (BID) | ORAL | Status: DC

## 2015-03-31 NOTE — ED Notes (Signed)
Pt in SVT at rate of 195. PA in room on insertion of IV pt HR dropped to 90's.

## 2015-03-31 NOTE — Telephone Encounter (Signed)
Echo order placed. Will forward to Scott, EP scheduler and Ebony, echo scheduler, to call pt to schedule appts.

## 2015-03-31 NOTE — Discharge Instructions (Signed)
1. Medications: Metoprolol, usual home medications 2. Treatment: rest, drink plenty of fluids,  3. Follow Up: Please followup with Dr. Eldridge Dace ASAP days for discussion of your diagnoses and further evaluation after today's visit; if you do not have a primary care doctor use the resource guide provided to find one; Please return to the ER for return of symptoms, development of chest pain or shortness of breath   Supraventricular Tachycardia Supraventricular tachycardia (SVT) is an abnormal heart rhythm (arrhythmia) that causes the heart to beat very fast (tachycardia). This kind of fast heartbeat originates in the upper chambers of the heart (atria). SVT can cause the heart to beat greater than 100 beats per minute. SVT can have a rapid burst of heartbeats. This can start and stop suddenly without warning and is called nonsustained. SVT can also be sustained, in which the heart beats at a continuous fast rate.  CAUSES  There can be different causes of SVT. Some of these include:  Heart valve problems such as mitral valve prolapse.  An enlarged heart (hypertrophic cardiomyopathy).  Congenital heart problems.  Heart inflammation (pericarditis).  Hyperthyroidism.  Low potassium or magnesium levels.  Caffeine.  Drug use such as cocaine, methamphetamines, or stimulants.  Some over-the-counter medicines such as:  Decongestants.  Diet medicines.  Herbal medicines. SYMPTOMS  Symptoms of SVT can vary. Symptoms depend on whether the SVT is sustained or nonsustained. You may experience:  No symptoms (asymptomatic).  An awareness of your heart beating rapidly (palpitations).  Shortness of breath.  Chest pain or pressure. If your blood pressure drops because of the SVT, you may experience:  Fainting or near fainting.  Weakness.  Dizziness. DIAGNOSIS  Different tests can be performed to diagnose SVT, such as:  An electrocardiogram (EKG). This is a painless test that records the  electrical activity of your heart.  Holter monitor. This is a 24 hour recording of your heart rhythm. You will be given a diary. Write down all symptoms that you have and what you were doing at the time you experienced symptoms.  Arrhythmia monitor. This is a small device that your wear for several weeks. It records the heart rhythm when you have symptoms.  Echocardiogram. This is an imaging test to help detect abnormal heart structure such as congenital abnormalities, heart valve problems, or heart enlargement.  Stress test. This test can help determine if the SVT is related to exercise.  Electrophysiology study (EPS). This is a procedure that evaluates your heart's electrical system and can help your caregiver find the cause of your SVT. TREATMENT  Treatment of SVT depends on the symptoms, how often it recurs, and whether there are any underlying heart problems.   If symptoms are rare and no other cardiac disease is present, no treatment may be needed.  Blood work may be done to check potassium, magnesium, and thyroid hormone levels to see if they are abnormal. If these levels are abnormal, treatment to correct the problems will occur. Medicines Your caregiver may use oral medicines to treat SVT. These medicines are given for long-term control of SVT. Medicines may be used alone or in combination with other treatments. These medicines work to slow nerve impulses in the heart muscle. These medicines can also be used to treat high blood pressure. Some of these medicines may include:  Calcium channel blockers.  Beta blockers.  Digoxin. Nonsurgical procedures Nonsurgical techniques may be used if oral medicines do not work. Some examples include:  Cardioversion. This technique uses either drugs  or an electrical shock to restore a normal heart rhythm.  Cardioversion drugs may be given through an intravenous (IV) line to help "reset" the heart rhythm.  In electrical cardioversion, the  caregiver shocks your heart to stop its beat for a split second. This helps to reset the heart to a normal rhythm.  Ablation. This procedure is done under mild sedation. High frequency radio wave energy is used to destroy the area of heart tissue responsible for the SVT. HOME CARE INSTRUCTIONS   Do not smoke.  Only take medicines prescribed by your caregiver. Check with your caregiver before using over-the-counter medicines.  Check with your caregiver about how much alcohol and caffeine (coffee, tea, colas, or chocolate) you may have.  It is very important to keep all follow-up referrals and appointments in order to properly manage this problem. SEEK IMMEDIATE MEDICAL CARE IF:  You have dizziness.  You faint or nearly faint.  You have shortness of breath.  You have chest pain or pressure.  You have sudden nausea or vomiting.  You have profuse sweating.  You are concerned about how long your symptoms last.  You are concerned about the frequency of your SVT episodes. If you have the above symptoms, call your local emergency services (911 in [place] immediately. Do not drive yourself to the hospital. MAKE SURE YOU:   Understand these instructions.  Will watch your condition.  Will get help right away if you are not doing well or get worse. Document Released: 08/09/2005 Document Revised: 11/01/2011 Document Reviewed: 11/21/2008 [facility] Patient Information 2015 [place], [place]. This information is not intended to replace advice given to you by your health care provider. Make sure you discuss any questions you have with your health care provider.

## 2015-03-31 NOTE — ED Provider Notes (Signed)
CSN: [id number]     Arrival date & time 03/31/15  1013 History   First MD Initiated Contact with Patient 03/31/15 1015     No chief complaint on file.    (Consider location/radiation/quality/duration/timing/severity/associated sxs/prior Treatment) The history is provided by the patient and medical records. No language interpreter was used.     Richard Walker is a 59 y.o. male  with a hx of GERD, hyperlipidemia, DDD, diverticulosis, SVTpresents to the Emergency Department complaining of gradual, persistent, progressively worsening chest pressure with associated palpitations, diaphoresis, shortness of breath, lightheadedness onset between 8:30 and 9 AM this morning. Patient reports he felt well when he awoke, drink 1 cup of coffee as usual for himself and drove to work.  Patient reports last episode of SVT was 2 years ago. He did not follow-up with cardiology.  Patient denies syncope today, loss of bowel or bladder control, vision changes, focal weakness. No aggravating or alleviating factors prior to arrival here in the emergency department.   Past Medical History  Diagnosis Date  . Hyperlipidemia   . GERD (gastroesophageal reflux disease)   . DDD (degenerative disc disease), lumbar     L3/L4  . Diverticulosis   . Low sperm motility   . SVT (supraventricular tachycardia)    History reviewed. No pertinent past surgical history. Family History  Problem Relation Age of Onset  . Hypertension Father    History  Substance Use Topics  . Smoking status: Former Games developer  . Smokeless tobacco: Current User     Comment: Uses Hookah Pipe occasionally  . Alcohol Use: No    Review of Systems  Constitutional: Positive for diaphoresis. Negative for fever, appetite change, fatigue and unexpected weight change.  HENT: Negative for mouth sores.   Eyes: Negative for visual disturbance.  Respiratory: Positive for shortness of breath. Negative for cough, chest tightness and wheezing.   Cardiovascular:  Positive for chest pain.  Gastrointestinal: Negative for nausea, vomiting, abdominal pain, diarrhea and constipation.  Endocrine: Negative for polydipsia, polyphagia and polyuria.  Genitourinary: Negative for dysuria, urgency, frequency and hematuria.  Musculoskeletal: Negative for back pain and neck stiffness.  Skin: Negative for rash.  Allergic/Immunologic: Negative for immunocompromised state.  Neurological: Positive for dizziness and light-headedness. Negative for syncope and headaches.  Hematological: Does not bruise/bleed easily.  Psychiatric/Behavioral: Negative for sleep disturbance. The patient is not nervous/anxious.       Allergies  Review of patient's allergies indicates no known allergies.  Home Medications   Prior to Admission medications   Medication Sig Start Date End Date Taking? Authorizing Provider  cholecalciferol (VITAMIN D) 1000 UNITS tablet Take 1,000 Units by mouth daily.   Yes Historical Provider, MD  esomeprazole (NEXIUM) 40 MG capsule Take 1 capsule (40 mg total) by mouth daily before breakfast. 03/29/14 03/31/15 Yes Gillian Scarce, MD  metoprolol (LOPRESSOR) 25 MG tablet Take 1 tablet (25 mg total) by mouth 2 (two) times daily. 03/31/15   Deetya Drouillard, PA-C   BP 124/68 mmHg  Pulse 77  Temp(Src) 96.4 F (35.8 C) (Oral)  Resp 18  Ht  (1.753 m)  Wt 230 lb (104.327 kg)  BMI 33.95 kg/m2  SpO2 100% Physical Exam  Constitutional: He appears well-developed and well-nourished. He appears distressed.  Awake, alert, nontoxic appearance  HENT:  Head: Normocephalic and atraumatic.  Mouth/Throat: Oropharynx is clear and moist. No oropharyngeal exudate.  Eyes: Conjunctivae are normal. No scleral icterus.  Neck: Normal range of motion. Neck supple.  Cardiovascular: Regular rhythm,  normal heart sounds and intact distal pulses.  Tachycardia present.   Pulses:      Radial pulses are 1+ on the right side, and 1+ on the left side.  Tachycardia at 195 [place]  radial pulses  Pulmonary/Chest: Effort normal and breath sounds normal. No respiratory distress. He has no wheezes.  Equal chest expansion  Abdominal: Soft. Bowel sounds are normal. He exhibits no mass. There is no tenderness. There is no rebound and no guarding.  Musculoskeletal: Normal range of motion. He exhibits no edema.  Neurological: He is alert.  Speech is clear and goal oriented Moves extremities without ataxia  Skin: Skin is warm. He is diaphoretic. There is pallor.  Psychiatric: He has a normal mood and affect.  Nursing note and vitals reviewed.   ED Course  Procedures (including critical care time) Labs Review Labs Reviewed  CBC - Abnormal; Notable for the following:    RBC 6.03 (*)    Hemoglobin 17.5 (*)    All other components within normal limits  COMPREHENSIVE METABOLIC PANEL - Abnormal; Notable for the following:    Glucose, Bld 124 (*)    BUN 21 (*)    Creatinine, Ser 1.34 (*)    GFR calc non Af Amer 57 (*)    All other components within normal limits  TROPONIN I - Abnormal; Notable for the following:    Troponin I 0.19 (*)    All other components within normal limits  TROPONIN I    Imaging Review No results found.   ED ECG REPORT   Date: 03/31/2015 @10 :19:20   Rate: 198  Rhythm: supraventricular tachycardia (SVT)  QRS Axis: normal  Intervals: n/a  ST/T Wave abnormalities: nonspecific ST/T changes  Conduction Disutrbances: nonspecific intraventricular block  Narrative Interpretation: SVT with nonspecific ST/T changes  Old EKG Reviewed: changes noted from 06/06/13 when he had a normal ECG  I have personally reviewed the EKG tracing and agree with the computerized printout as noted above.    EKG Interpretation  Date/Time:  Monday March 31 2015 10:22:52 EDT Ventricular Rate:  92 PR Interval:  178 QRS Duration: 92 QT Interval:  342 QTC Calculation: 422 R Axis:   20 Text Interpretation:  Normal sinus rhythm Nonspecific ST abnormality   Abnormal ECG svt has resolved Confirmed by Mirian Mo [phone]) on  03/31/2015 10:35:55 AM       EKG Interpretation  Date/Time:  Monday March 31 2015 11:53:38 EDT Ventricular Rate:  85 PR Interval:  168 QRS Duration: 86 QT Interval:  376 QTC Calculation: 447 R Axis:   27 Text Interpretation:  Normal sinus rhythm Normal ECG st changes now resolved Confirmed by Mirian Mo [id number]) on 03/31/2015 11:55:51 AM        CRITICAL CARE Performed by: Dierdre Forth Total critical care time: Critical care time was exclusive of separately billable procedures and treating other patients. Critical care was necessary to treat or prevent imminent or life-threatening deterioration. Critical care was time spent personally by me on the following activities: development of treatment plan with patient and/or surrogate as well as nursing, discussions with consultants, evaluation of patient's response to treatment, examination of patient, obtaining history from patient or surrogate, ordering and performing treatments and interventions, ordering and review of laboratory studies, ordering and review of radiographic studies, pulse oximetry and re-evaluation of patient's condition.   MDM   Final diagnoses:  SVT (supraventricular tachycardia)  Elevated troponin   Richard Walker presents to the emergency department and SVT with  a rate of 195. On initial exam patient diaphoretic, pale mild hypotension at 90/70.  IV inserted and patient with vagal response and heart rate to 86 and regular.  Her next 10 minutes patient's diaphoresis resolved. Blood pressure improved with 2+ radial and dorsalis pedis pulses.  Patient reports resolution of chest pain and shortness of breath.  Will check labs, monitor and repeat ECG.    12:03 PM Repeat EKG shows resolved ST changes; concern for possible ischemic, rate related event. Patient's initial troponin is negative however with dynamic EKG changes after  resolution of SVT concern exist for stress test. Will obtain delta troponin and consult cardiology.  2:44 PM Pt with new positive troponin.  Pt reports he feels well.  No persistent chest pain.  Discussed with Dr. Eldridge Dace who feels that pt's positive troponin is likely from the increased HR.  Pt without return of symptoms or persistent symptoms here in the ED.  Pt ambulates without CP, SOB or tachycardia.  Pt with essentially positive stress test. Will have close follow-up with cardiology and will begin beta blockers.    BP 124/68 mmHg  Pulse 77  Temp(Src) 96.4 F (35.8 C) (Oral)  Resp 18  Ht 5\' 9"  (1.753 m)  Wt 230 lb (104.327 kg)  BMI 33.95 kg/m2  SpO2 100%  The patient was discussed with and seen by Dr. Littie Deeds who agrees with the treatment plan.  Dr. Littie Deeds also discussed with patient with Dr. Eldridge Dace.     Dierdre Forth, PA-C 03/31/15 [address] PA-C 03/31/15 1650  Mirian Mo, MD 04/02/15 2256

## 2015-03-31 NOTE — ED Notes (Signed)
C/o racing heart rate that started about 0830 this am.  C/o upper bil c/p and sob with burning. Hx of SVT.

## 2015-03-31 NOTE — Telephone Encounter (Signed)
    Patient had an episode of SVT, HR 198.  It terminated with vagal maneuver.  Patient wants to go home.  Would start metoprolol 25 mg BID. Instruction given to Dr. Janann August PA at [facility].  He needs echo and EP consultation.   He had prior episode of SVT in the past.  He is very symptomatic when in the rhythm.  Mild troponin elevation noted but feels fine in NSR and never had anginal sx prior to the SVT.   If sx change will admit.  Otherwise, he will be discharged from the ER with cardiology f/u.  Patient can be reached at [phone].  Corky Crafts, MD

## 2015-03-31 NOTE — Telephone Encounter (Signed)
    ER called me back to tell me his phone number is [phone]  Corky Crafts, MD

## 2015-04-02 ENCOUNTER — Other Ambulatory Visit: Payer: Self-pay

## 2015-04-02 ENCOUNTER — Ambulatory Visit (HOSPITAL_COMMUNITY): Payer: BLUE CROSS/BLUE SHIELD | Attending: Cardiology

## 2015-04-02 DIAGNOSIS — I358 Other nonrheumatic aortic valve disorders: Secondary | ICD-10-CM | POA: Insufficient documentation

## 2015-04-02 DIAGNOSIS — E785 Hyperlipidemia, unspecified: Secondary | ICD-10-CM | POA: Diagnosis not present

## 2015-04-02 DIAGNOSIS — E669 Obesity, unspecified: Secondary | ICD-10-CM | POA: Insufficient documentation

## 2015-04-02 DIAGNOSIS — F17200 Nicotine dependence, unspecified, uncomplicated: Secondary | ICD-10-CM | POA: Insufficient documentation

## 2015-04-02 DIAGNOSIS — I471 Supraventricular tachycardia: Secondary | ICD-10-CM

## 2015-04-02 DIAGNOSIS — Z6834 Body mass index (BMI) 34.0-34.9, adult: Secondary | ICD-10-CM | POA: Insufficient documentation

## 2015-04-02 DIAGNOSIS — I517 Cardiomegaly: Secondary | ICD-10-CM | POA: Diagnosis not present

## 2015-04-03 ENCOUNTER — Telehealth: Payer: Self-pay | Admitting: *Deleted

## 2015-04-03 NOTE — Telephone Encounter (Signed)
-----   Message from Corky Crafts, MD sent at 04/03/2015  4:21 PM EDT ----- No regional wall motion abnormalities.  Proceed with EP f/u regarding SVT.

## 2015-04-23 ENCOUNTER — Ambulatory Visit (INDEPENDENT_AMBULATORY_CARE_PROVIDER_SITE_OTHER): Payer: BLUE CROSS/BLUE SHIELD | Admitting: Internal Medicine

## 2015-04-23 ENCOUNTER — Encounter: Payer: Self-pay | Admitting: Internal Medicine

## 2015-04-23 VITALS — BP 108/70 | HR 69 | Ht 70.0 in | Wt 236.6 lb

## 2015-04-23 DIAGNOSIS — I471 Supraventricular tachycardia, unspecified: Secondary | ICD-10-CM | POA: Insufficient documentation

## 2015-04-23 NOTE — Patient Instructions (Signed)
Medication Instructions: - no changes  Labwork: - none  Procedures/Testing: - none  Follow-Up: - [name] will see you back on an as needed basis.  - Please feel free to give [place] a call back if you decide to schedule an ablation- Dennis Bast, RN [phone]  Any Additional Special Instructions Will Be Listed Below (If Applicable). - none

## 2015-04-23 NOTE — Assessment & Plan Note (Signed)
His symptoms have been fairly well controlled. We discussed the treatment options including continuing his beta blocker vs proceeding with catheter ablation. The risk/benefits/goals/expectations of ablation were discussed in detail. He will undergo watchful waiting, take his beta blocker and let us know if he would like to proceed with ablation. We discussed additional vagal maneuvers that might be utilized as well today.

## 2015-04-23 NOTE — Progress Notes (Signed)
HPI [name] is referred today for evaluation of SVT. He is a pleasant 59 yo man who has otherwise been healthy. He has had 2 episodes of SVT in the last 2 years, most recently several weeks ago which was a narrow QRS tachycardia at 200/min which was terminated with IV adenosine. He has been on beta blocker therapy since and had no additional SVT. The patient has associated sob and chest pressure with his symptoms but no syncope. Initial vagal maneuvers were ineffective in terminating his SVT.  No Known Allergies   Current Outpatient Prescriptions  Medication Sig Dispense Refill  . cholecalciferol (VITAMIN D) 1000 UNITS tablet Take 1,000 Units by mouth daily.    [name] ibuprofen (ADVIL,MOTRIN) 200 MG tablet Take 400-600 mg by mouth as directed.    . metoprolol (LOPRESSOR) 25 MG tablet Take 1 tablet (25 mg total) by mouth 2 (two) times daily. 60 tablet 0  . Vitamin D, Ergocalciferol, (DRISDOL) 50000 UNITS CAPS capsule Take 50,000 Units by mouth 2 (two) times a week.    . esomeprazole (NEXIUM) 40 MG capsule Take 1 capsule (40 mg total) by mouth daily before breakfast. 30 capsule 11   No current facility-administered medications for this visit.     Past Medical History  Diagnosis Date  . Hyperlipidemia   . GERD (gastroesophageal reflux disease)   . DDD (degenerative disc disease), lumbar     L3/L4  . Diverticulosis   . Low sperm motility   . SVT (supraventricular tachycardia)     ROS:   All systems reviewed and negative except as noted in the HPI.   No past surgical history on file.   Family History  Problem Relation Age of Onset  . Hypertension Father      Social History   Social History  . Marital Status: Married    Spouse Name: N/A  . Number of Children: N/A  . Years of Education: N/A   Occupational History  . Not on file.   Social History Main Topics  . Smoking status: Former Games developer  . Smokeless tobacco: Current User     Comment: Uses Hookah Pipe  occasionally  . Alcohol Use: No  . Drug Use: No  . Sexual Activity: Yes   Other Topics Concern  . Not on file   Social History Narrative   Marital Status: Married Engineer, drilling)   Children:  4 (3 Sons/1 Daughter)   Pets:  None    Living Situation: Lives with spouse and 4 children   Origin:  He was born in [place].   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     Alcohol Use:  None   Drug Use:  None   Diet:  Regular   Exercise:  None   Hobbies:  Cards     BP 108/70 mmHg  Pulse 69  Ht 5\' 10"  (1.778 m)  Wt 236 lb 9.6 oz (107.321 kg)  BMI 33.95 kg/m2  Physical Exam:  Well appearing 59 yo man, NAD HEENT: Unremarkable Neck:  7 cm JVD, no thyromegally Lymphatics:  No adenopathy Back:  No CVA tenderness Lungs:  Clear with no wheezes HEART:  Regular rate rhythm, no murmurs, no rubs, no clicks Abd:  soft, positive bowel sounds, no organomegally, no rebound, no guarding Ext:  2 plus pulses, no edema, no cyanosis, no clubbing Skin:  No rashes no nodules Neuro:  CN  II through XII intact, motor grossly intact  EKG - nsr with normal axis and intervals.   Assess/Plan:

## 2015-05-05 ENCOUNTER — Telehealth: Payer: Self-pay | Admitting: Internal Medicine

## 2015-05-05 NOTE — Telephone Encounter (Signed)
New message     Pt returning call to office

## 2015-05-05 NOTE — Telephone Encounter (Signed)
Patient states there was no message left but he could see where our office called him. No indication of where anyone called him. He had already rec'd his lab results from last visit. States if anyone needs him, they can call him back.

## 2015-08-24 HISTORY — PX: OTHER SURGICAL HISTORY: SHX169

## 2015-09-18 ENCOUNTER — Emergency Department (HOSPITAL_BASED_OUTPATIENT_CLINIC_OR_DEPARTMENT_OTHER)
Admission: EM | Admit: 2015-09-18 | Discharge: 2015-09-18 | Disposition: A | Discharge status: Home / Self Care | Payer: BLUE CROSS/BLUE SHIELD | Attending: Emergency Medicine | Admitting: Emergency Medicine

## 2015-09-18 ENCOUNTER — Emergency Department (HOSPITAL_BASED_OUTPATIENT_CLINIC_OR_DEPARTMENT_OTHER): Payer: BLUE CROSS/BLUE SHIELD

## 2015-09-18 ENCOUNTER — Encounter (HOSPITAL_BASED_OUTPATIENT_CLINIC_OR_DEPARTMENT_OTHER): Payer: Self-pay | Admitting: *Deleted

## 2015-09-18 DIAGNOSIS — R0981 Nasal congestion: Secondary | ICD-10-CM

## 2015-09-18 DIAGNOSIS — M542 Cervicalgia: Secondary | ICD-10-CM | POA: Insufficient documentation

## 2015-09-18 DIAGNOSIS — Z8639 Personal history of other endocrine, nutritional and metabolic disease: Secondary | ICD-10-CM | POA: Insufficient documentation

## 2015-09-18 DIAGNOSIS — R61 Generalized hyperhidrosis: Secondary | ICD-10-CM | POA: Diagnosis not present

## 2015-09-18 DIAGNOSIS — R002 Palpitations: Secondary | ICD-10-CM

## 2015-09-18 DIAGNOSIS — R079 Chest pain, unspecified: Secondary | ICD-10-CM | POA: Diagnosis not present

## 2015-09-18 DIAGNOSIS — R0602 Shortness of breath: Secondary | ICD-10-CM | POA: Diagnosis not present

## 2015-09-18 DIAGNOSIS — Z9981 Dependence on supplemental oxygen: Secondary | ICD-10-CM | POA: Insufficient documentation

## 2015-09-18 DIAGNOSIS — K219 Gastro-esophageal reflux disease without esophagitis: Secondary | ICD-10-CM | POA: Diagnosis not present

## 2015-09-18 DIAGNOSIS — Z79899 Other long term (current) drug therapy: Secondary | ICD-10-CM | POA: Diagnosis not present

## 2015-09-18 DIAGNOSIS — I471 Supraventricular tachycardia: Secondary | ICD-10-CM | POA: Insufficient documentation

## 2015-09-18 DIAGNOSIS — Z87891 Personal history of nicotine dependence: Secondary | ICD-10-CM | POA: Insufficient documentation

## 2015-09-18 LAB — BASIC METABOLIC PANEL
Anion gap: 12 (ref 5–15)
BUN: 22 mg/dL — AB (ref 6–20)
CHLORIDE: 105 mmol/L (ref 101–111)
CO2: 21 mmol/L — ABNORMAL LOW (ref 22–32)
Calcium: 9 mg/dL (ref 8.9–10.3)
Creatinine, Ser: 1.34 mg/dL — ABNORMAL HIGH (ref 0.61–1.24)
GFR calc Af Amer: >60 mL/min (ref >60)
GFR calc non Af Amer: 56 mL/min — ABNORMAL LOW (ref >60)
GLUCOSE: 117 mg/dL — AB (ref 65–99)
Potassium: 3.7 mmol/L (ref 3.5–5.1)
SODIUM: 138 mmol/L (ref 135–145)

## 2015-09-18 LAB — CBC WITH DIFFERENTIAL/PLATELET
BASOS ABS: 0 10*3/uL (ref 0.0–0.1)
Basophils Relative: 0 %
Eosinophils Absolute: 0.1 10*3/uL (ref 0.0–0.7)
Eosinophils Relative: 1 %
HEMATOCRIT: 48.3 % (ref 39.0–52.0)
Hemoglobin: 16.1 g/dL (ref 13.0–17.0)
LYMPHS PCT: 24 %
Lymphs Abs: 2.2 10*3/uL (ref 0.7–4.0)
MCH: 28.2 pg (ref 26.0–34.0)
MCHC: 33.3 g/dL (ref 30.0–36.0)
MCV: 84.6 fL (ref 78.0–100.0)
MONO ABS: 0.7 10*3/uL (ref 0.1–1.0)
Monocytes Relative: 7 %
Neutro Abs: 6.1 10*3/uL (ref 1.7–7.7)
Neutrophils Relative %: 68 %
Platelets: 177 10*3/uL (ref 150–400)
RBC: 5.71 MIL/uL (ref 4.22–5.81)
RDW: 13.3 % (ref 11.5–15.5)
WBC: 9 10*3/uL (ref 4.0–10.5)

## 2015-09-18 LAB — TROPONIN I: Troponin I: 0.12 ng/mL — ABNORMAL HIGH (ref <0.031)

## 2015-09-18 MED ORDER — FLUTICASONE PROPIONATE 50 MCG/ACT NA SUSP: 2.0000 | Freq: Every day | NASAL | Status: DC

## 2015-09-18 NOTE — ED Notes (Signed)
PA at bedside.

## 2015-09-18 NOTE — ED Provider Notes (Signed)
CSN: [id number]     Arrival date & time 09/18/15  1625 History   First MD Initiated Contact with Patient 09/18/15 1627     Chief Complaint  Patient presents with  . Chest Pain    HPI   Richard Walker is an 60 y.o. male with history of SVT who presents to the ED for evaluation of chest pain. Last episode of SVT was in 03/2015; started on metoprolol which he has not been taking regularly because he states he has felt fine. He states he was in his usual state of health until today when he states he has just felt "off" all day like he was coming down with something. Endorses sinus congestion. Denies cough. Reports he had a stressful day at work but nothing out of the ordinary. States that he was trying to relax at home after work when he suddenly felt like his heart was racing. Reports associated chest pain, neck pain, diaphoresis, and SOB. He states he took aspirin and tylenol with no relief. States that then took two metoprolol which did not relieve his symptoms so he started coming to the ED. He states in the car his symptoms resolved, and he started heading home again. However, within 10 minutes he reports the chest pain, racing heart, palpitations, and diaphoresis started again. This once again resolved upon arrival to the ED. In the ED now he is pain free. He states he still feels a bit fatigued but otherwise back to baseline. His EKG shows NSR and HR on arrival 83. No diaphoresis, tachypnea, or increased WOB. Pt states he does have supplemental oxygen that he wears at home at night on a prn basis.   Past Medical History  Diagnosis Date  . Hyperlipidemia   . GERD (gastroesophageal reflux disease)   . DDD (degenerative disc disease), lumbar     L3/L4  . Diverticulosis   . Low sperm motility   . SVT (supraventricular tachycardia)    No past surgical history on file. Family History  Problem Relation Age of Onset  . Hypertension Father    Social History  Substance Use Topics  . Smoking status:  Former Games developer  . Smokeless tobacco: Current User     Comment: Uses Hookah Pipe occasionally  . Alcohol Use: No    Review of Systems  All other systems reviewed and are negative.     Allergies  Review of patient's allergies indicates no known allergies.  Home Medications   Prior to Admission medications   Medication Sig Start Date End Date Taking? Authorizing Provider  cholecalciferol (VITAMIN D) 1000 UNITS tablet Take 1,000 Units by mouth daily.    Historical Provider, MD  esomeprazole (NEXIUM) 40 MG capsule Take 1 capsule (40 mg total) by mouth daily before breakfast. 03/29/14 03/31/15  Gillian Scarce, MD  ibuprofen (ADVIL,MOTRIN) 200 MG tablet Take 400-600 mg by mouth as directed.    Historical Provider, MD  metoprolol (LOPRESSOR) 25 MG tablet Take 1 tablet (25 mg total) by mouth 2 (two) times daily. 03/31/15   Hannah Muthersbaugh, PA-C  Vitamin D, Ergocalciferol, (DRISDOL) 50000 UNITS CAPS capsule Take 50,000 Units by mouth 2 (two) times a week. 02/27/15 02/28/16  Historical Provider, MD   BP 118/93 mmHg  Pulse 84  Temp(Src) 97.8 F (36.6 C) (Oral)  Resp 24  SpO2 97% Physical Exam  Constitutional: He is oriented to person, place, and time.  HENT:  Right Ear: External ear normal.  Left Ear: External ear normal.  Nose: Mucosal  edema present. Right sinus exhibits no maxillary sinus tenderness and no frontal sinus tenderness. Left sinus exhibits no maxillary sinus tenderness and no frontal sinus tenderness.  Mouth/Throat: Oropharynx is clear and moist. No oropharyngeal exudate.  Eyes: Conjunctivae and EOM are normal. Pupils are equal, round, and reactive to light.  Neck: Normal range of motion. Neck supple.  Cardiovascular: Normal rate, regular rhythm, normal heart sounds and intact distal pulses.   Pulmonary/Chest: Effort normal and breath sounds normal. No respiratory distress. He has no wheezes. He exhibits no tenderness.  Abdominal: Soft. Bowel sounds are normal. He exhibits no  distension. There is no tenderness. There is no rebound and no guarding.  Musculoskeletal: He exhibits no edema.  Lymphadenopathy:    He has no cervical adenopathy.  Neurological: He is alert and oriented to person, place, and time. No cranial nerve deficit.  Skin: Skin is warm and dry.  Psychiatric: He has a normal mood and affect.  Nursing note and vitals reviewed.   ED Course  Procedures (including critical care time) Labs Review Labs Reviewed  BASIC METABOLIC PANEL - Abnormal; Notable for the following:    CO2 21 (*)    Glucose, Bld 117 (*)    BUN 22 (*)    Creatinine, Ser 1.34 (*)    GFR calc non Af Amer 56 (*)    All other components within normal limits  TROPONIN I - Abnormal; Notable for the following:    Troponin I 0.12 (*)    All other components within normal limits  TROPONIN I  CBC WITH DIFFERENTIAL/PLATELET    Imaging Review Dg Chest 2 View  09/18/2015  CLINICAL DATA:  Acute onset sinus congestion today. Tachycardia today. Near syncope. Initial encounter. EXAM: CHEST  2 VIEW COMPARISON:  PA and lateral chest 11/02/2011. FINDINGS: The lungs are clear. Heart size is normal. There is no pneumothorax or pleural effusion. No focal bony abnormality IMPRESSION: No acute disease. Electronically Signed   By: Drusilla Kanner M.D.   On: 09/18/2015 18:06   I have personally reviewed and evaluated these images and lab results as part of my medical decision-making.   EKG Interpretation   Date/Time:  Thursday September 18 2015 16:27:10 EST Ventricular Rate:  87 PR Interval:  148 QRS Duration: 96 QT Interval:  362 QTC Calculation: 435 R Axis:   47 Text Interpretation:  Sinus rhythm No significant change since last  tracing Confirmed by [facility] MD, ERIN ([id number]) on 09/18/2015 5:01:06 PM      MDM   Final diagnoses:  Chest pain, unspecified chest pain type  Palpitations  Nasal congestion    Pt is an 60 y.o. male with history of SVT, not taking his metoprolol  regularly, who presents with two episodes of palpitations, heart racing, chest pain, diaphoresis. Symptoms resolved on arrival. We did not capture any SVT on monitor during pt's ER stay. His initial EKG, trop, CXR, bmp, andcbc were unremarkable. I ordered delta trop due to pt's complaint of chest pain with diaphoresis and palpitations since we did not see any SVT here. His second troponin elevated to 0.12 which I suspect likely due to earlier tachycardia. I spoke to Dr. Tenny Craw of cardiology who agrees. At this point she feels and I agree that pt is stable for outpatient f/u in clinic. Instructed pt to take metoprolol BID as prescribed. Pt to f/u early next week in cards clinic. Rx given for flonase given pt's complaints of nasal congestion. ER return precautions given. Pt verbalized agreement and  understanding.     Richard AREY, PA-C 09/18/15 2119  Alvira Monday, MD 09/19/15 1308

## 2015-09-18 NOTE — Discharge Instructions (Signed)
You were seen in the emergency room today for evaluation of heart racing, chest pain, shortness of breath. We did not see any fast rhythms on your monitor today. I spoke to the on-call cardiologist and they agree that you are safe to go home. Please take your metoprolol twice a day as prescribed. Please call Dr. Lubertha Basque office to schedule a follow up appointment early next week. Return to the ER for new or worsening symptoms.

## 2015-09-18 NOTE — ED Notes (Signed)
Patient states today after working, he felt that he was getting a head cold with sinus congestion and slight fever.  States he went home and took one  aspirin and two 325 mg tylenol and laid down.  States when he went to lay down, he felt his heart racing.  He took two 25 mg Metoprolol and then got into the car to come to the ed.  This occurred approximately 45 minutes pta.  States as he arrived to the ed, he felt the rhythm change and felt better, so he decided to go home.  Once arriving at home, the rhythm returned and he came back.  Patient was pale, clammy, and diaphoric and complaining of left chest pain.  History of SVT.

## 2015-09-23 ENCOUNTER — Ambulatory Visit: Payer: BLUE CROSS/BLUE SHIELD | Admitting: Internal Medicine

## 2015-09-23 ENCOUNTER — Ambulatory Visit (INDEPENDENT_AMBULATORY_CARE_PROVIDER_SITE_OTHER): Payer: BLUE CROSS/BLUE SHIELD | Admitting: Internal Medicine

## 2015-09-23 ENCOUNTER — Encounter: Payer: Self-pay | Admitting: Internal Medicine

## 2015-09-23 VITALS — BP 110/82 | HR 75 | Ht 70.0 in | Wt 254.8 lb

## 2015-09-23 DIAGNOSIS — I471 Supraventricular tachycardia: Secondary | ICD-10-CM | POA: Diagnosis not present

## 2015-09-23 MED ORDER — METOPROLOL TARTRATE 25 MG PO TABS: 25.0000 mg | ORAL_TABLET | Freq: Two times a day (BID) | ORAL | Status: DC

## 2015-09-23 NOTE — Progress Notes (Signed)
HPI Richard Walker is referred today for evaluation of SVT. He is a pleasant 60 yo man who has otherwise been healthy. He has had 4 episodes of SVT in the last 2 years, most recently several weeks ago which was a narrow QRS tachycardia at 200/min which was terminated with IV adenosine. He has been on beta blocker therapy since and had no additional SVT. The patient has associated sob and chest pressure with his symptoms but no syncope. Initial vagal maneuvers were ineffective in terminating his SVT. He was in the ED last week with symptoms. He has not taken daily metoprolol. No Known Allergies   Current Outpatient Prescriptions  Medication Sig Dispense Refill  . cholecalciferol (VITAMIN D) 1000 UNITS tablet Take 1,000 Units by mouth as directed.     [name] ibuprofen (ADVIL,MOTRIN) 200 MG tablet Take 400-600 mg by mouth as directed.    [name] esomeprazole (NEXIUM) 40 MG capsule Take 1 capsule (40 mg total) by mouth daily before breakfast. 30 capsule 11  . metoprolol tartrate (LOPRESSOR) 25 MG tablet Take 1 tablet (25 mg total) by mouth 2 (two) times daily. 180 tablet 3  . Vitamin D, Ergocalciferol, (DRISDOL) 50000 UNITS CAPS capsule Take 50,000 Units by mouth 2 (two) times a week. Reported on 09/23/2015     No current facility-administered medications for this visit.     Past Medical History  Diagnosis Date  . Hyperlipidemia   . GERD (gastroesophageal reflux disease)   . DDD (degenerative disc disease), lumbar     L3/L4  . Diverticulosis   . Low sperm motility   . SVT (supraventricular tachycardia) (HCC)     ROS:   All systems reviewed and negative except as noted in the HPI.   Past Surgical History  Procedure Laterality Date  . No past surgeries       Family History  Problem Relation Age of Onset  . Hypertension Father      Social History   Social History  . Marital Status: Married    Spouse Name: N/A  . Number of Children: N/A  . Years of Education: N/A   Occupational  History  . Not on file.   Social History Main Topics  . Smoking status: Former Games developer  . Smokeless tobacco: Current User     Comment: Uses Hookah Pipe occasionally  . Alcohol Use: No  . Drug Use: No  . Sexual Activity: Yes   Other Topics Concern  . Not on file   Social History Narrative   Marital Status: Married Engineer, drilling)   Children:  4 (3 Sons/1 Daughter)   Pets:  None    Living Situation: Lives with spouse and 4 children   Origin:  He was born in [place].   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     Alcohol Use:  None   Drug Use:  None   Diet:  Regular   Exercise:  None   Hobbies:  Cards     BP 110/82 mmHg  Pulse 75  Ht  (1.778 m)  Wt 254 lb 12.8 oz (115.577 kg)  BMI 36.56 kg/m2  Physical Exam:  Well appearing 60 yo man, NAD HEENT: Unremarkable Neck:  7 cm JVD, no thyromegally Lymphatics:  No adenopathy Back:  No CVA tenderness Lungs:  Clear with no wheezes HEART:  Regular rate rhythm, no murmurs, no rubs, no clicks Abd:  soft, positive bowel sounds, no organomegally, no rebound, no guarding Ext:  2 plus pulses, no edema, no cyanosis, no clubbing Skin:  No rashes no nodules Neuro:  CN II through XII intact, motor grossly intact  EKG - nsr with normal axis and intervals.   Assess/Plan: 1. SVT - he continues to have symptoms. I have offered him ablation but he would like to try twice daily metoprolol.  2. Dyslipidemia - he will continue a low fat diet.

## 2015-09-23 NOTE — Patient Instructions (Signed)
Medication Instructions:  Your physician has recommended you make the following change in your medication:  1) Start Metoprolol 25 mg twice daily    Labwork: None ordered     Testing/Procedures: Your physician has recommended that you have an ablation. Catheter ablation is a medical procedure used to treat some cardiac arrhythmias (irregular heartbeats). During catheter ablation, a long, thin, flexible tube is put into a blood vessel in your groin (upper thigh), or neck. This tube is called an ablation catheter. It is then guided to your heart through the blood vessel. Radio frequency waves destroy small areas of heart tissue where abnormal heartbeats may cause an arrhythmia to start. Please see the instruction sheet given to you today.  Call if you decide to proceed with ablation    Follow-Up:  Your physician wants you to follow-up in: 12 months with Dr Court Joy will receive a reminder letter in the mail two months in advance. If you don't receive a letter, please call our office to schedule the follow-up appointment.    Any Other Special Instructions Will Be Listed Below (If Applicable).     If you need a refill on your cardiac medications before your next appointment, please call your pharmacy.

## 2015-10-16 ENCOUNTER — Ambulatory Visit: Payer: BLUE CROSS/BLUE SHIELD | Admitting: Internal Medicine

## 2015-11-13 ENCOUNTER — Institutional Professional Consult (permissible substitution): Payer: BLUE CROSS/BLUE SHIELD | Admitting: Neurology

## 2015-11-25 ENCOUNTER — Encounter: Payer: Self-pay | Admitting: Neurology

## 2015-11-25 ENCOUNTER — Ambulatory Visit (INDEPENDENT_AMBULATORY_CARE_PROVIDER_SITE_OTHER): Payer: BLUE CROSS/BLUE SHIELD | Admitting: Neurology

## 2015-11-25 VITALS — BP 130/88 | HR 80 | Resp 20 | Ht 70.0 in | Wt 246.0 lb

## 2015-11-25 DIAGNOSIS — G4733 Obstructive sleep apnea (adult) (pediatric): Secondary | ICD-10-CM | POA: Diagnosis not present

## 2015-11-25 DIAGNOSIS — H65491 Other chronic nonsuppurative otitis media, right ear: Secondary | ICD-10-CM

## 2015-11-25 DIAGNOSIS — J0130 Acute sphenoidal sinusitis, unspecified: Secondary | ICD-10-CM | POA: Diagnosis not present

## 2015-11-25 DIAGNOSIS — J0120 Acute ethmoidal sinusitis, unspecified: Secondary | ICD-10-CM | POA: Diagnosis not present

## 2015-11-25 DIAGNOSIS — R5383 Other fatigue: Secondary | ICD-10-CM

## 2015-11-25 DIAGNOSIS — E669 Obesity, unspecified: Secondary | ICD-10-CM

## 2015-11-25 NOTE — Progress Notes (Signed)
SLEEP MEDICINE CLINIC   Provider:  Melvyn Novas, [place] D  Referring Provider: Dr Hermelinda Medicus , MD   Primary Care Physician:  Richard Jubilee, MD  Chief Complaint  Patient presents with  . New Patient (Initial Visit)    on cpap, uses [facility], needs new cpap, rm 11, alone    HPI:  Richard Walker is a 60 y.o. male , seen here as a referral  from Richard Walker   Chief complaint according to patient : I had a sleep study with Richard Walker in [facility] 14 years ago , and later Richard Walker in [place] in 2002.  Not longer working for me. I have a 60 year old mask.  " I cannot get supplies and my wife reports snoring again. 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 Richard Walker requested the repeat of a sleep study and to treat the patient again for obstructive sleep apnea as indicated. He also wanted to make sure that the future use of CPAP and the concept of surgery on his nose will make the CPAP compliance easier with a better nasal patency. This visit took place on 08/06/2015 with a carbon copy to Richard Walker art, primary care physician.   Sleep habits are as follows: The patient works often early shifts in a [employer] he usually works mornings sometimes afternoons but rarely nights. He works as a Production designer, theatre/television/film. His bedtime varies depending on his work hours. He works one night per week. His average sleep time at night is between 9 and 10 PM, but he doesn't necessarily fall asleep promptly and often voice about his day or is not ready to wind down. He goes to the bedroom to sleep not to watch TV, the bedroom is cool, quiet and dark. After going to sleep initially he wakes up frequently, most of the time he wakes up because his mouth is parched and dry and he developed headaches. This  is better while using CPAP, but he has no supplies.  Since his nasal septum is deviated from a fracture, he seems to habitually resume mouth breathing. He goes to the bathroom at night at least twice. He rises in the morning for an early shift as early as 4 AM, for an afternoon show a shift he may sleep until 7. Frequently he wakes up feeling tired not restored or refreshed and with headaches. The headaches are described as throbbing of morbid the forehead and behind the eyes giving him a pressure sensation. He frequently takes Advil or Tylenol in the morning.    Sleep medical history and family sleep history:  Parents - father was a snorer. materanl grandfather was thunderous.  The patient does not have any childhood sleep disorder.   Social history:  The patient lives with his wife, he has not smoked cigarettes in 25 years, he does not drink alcohol, after he developed palpitations and tachycardic attacks he stopped using caffeine 1 or 2 cups of coffee now for the day it used to be much more. He does not use sodas or tea.  Review of Systems: Out of a complete 14 system review, the patient complains of only the following symptoms, and all other reviewed systems are negative. Shift work, sleep deprived, snoring, dry mouth, nasal obstruction.  Epworth score  6 ,  Fatigue severity score 35  , depression score N/a    Social History   Social History  . Marital Status: Married    Spouse Name: N/A  . Number of Children: N/A  . Years of Education: N/A   Occupational History  . Not on file.   Social History Main Topics  . Smoking status: Former Games developer  . Smokeless tobacco: Current User     Comment: Uses Hookah Pipe occasionally  . Alcohol Use: No  . Drug Use: No  . Sexual Activity: Yes   Other Topics Concern  . Not on file   Social History Narrative   Marital Status: Married Engineer, drilling)   Children:  4 (3 Sons/1 Daughter)   Pets:  None    Living Situation: Lives with spouse and 4  children   Origin:  He was born in [place].   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     Alcohol Use:  None   Drug Use:  None   Diet:  Regular   Exercise:  None   Hobbies:  Cards    Family History  Problem Relation Age of Onset  . Hypertension Father     Past Medical History  Diagnosis Date  . Hyperlipidemia   . GERD (gastroesophageal reflux disease)   . DDD (degenerative disc disease), lumbar     L3/L4  . Diverticulosis   . Low sperm motility   . SVT (supraventricular tachycardia) [facility])     Past Surgical History  Procedure Laterality Date  . No past surgeries    . Back surgery      Current Outpatient Prescriptions  Medication Sig Dispense Refill  . aspirin 81 MG tablet Take 81 mg by mouth daily.    . cholecalciferol (VITAMIN D) 1000 UNITS tablet Take 1,000 Units by mouth as directed.     [name] ibuprofen (ADVIL,MOTRIN) 200 MG tablet Take 400-600 mg by mouth as directed.    . loratadine (CLARITIN) 10 MG tablet Take 10 mg by mouth daily.    . metoprolol tartrate (LOPRESSOR) 25 MG tablet Take 1 tablet (25 mg total) by mouth 2 (two) times daily. 180 tablet 3  . Vitamin D, Ergocalciferol, (DRISDOL) 50000 UNITS CAPS capsule Take 50,000 Units by mouth 2 (two) times a week. Reported on 09/23/2015    . esomeprazole (NEXIUM) 40 MG capsule Take 1 capsule (40 mg total) by mouth daily before breakfast. 30 capsule 11   No current facility-administered medications for this visit.    Allergies as of 11/25/2015  . (No Known Allergies)    Vitals: BP 130/88 mmHg  Pulse 80  Resp 20  Ht  (1.778 m)  Wt 246 lb (111.585 kg)  BMI 35.30 kg/m2 Last Weight:  Wt Readings from Last 1 Encounters:  11/25/15 246 lb (111.585 kg)   WUJ:WJXB mass index is 35.3 kg/(m^2).     Last Height:   Ht Readings from Last 1 Encounters:  11/25/15  (1.778 m)     Physical exam:  General: The patient is awake, alert and appears not in acute distress. The patient is well groomed. Head: Normocephalic, atraumatic. Neck is supple. Mallampati 4  neck circumference 17.5 . Nasal airflow restricted , TMJ is not  evident . Retrognathia is seen.  Cardiovascular:  Regular rate and rhythm , without  murmurs or carotid bruit, and without distended neck veins. Respiratory: Lungs are clear to auscultation.  Skin:  Without evidence of edema, or rash Trunk: BMI is elevated - The patient's posture is erect.    Neurologic exam : The patient is awake and alert, oriented to place and time.   Memory subjective described as intact.  Attention span & concentration ability appears normal.  Speech is fluent,  without dysarthria, dysphonia or aphasia.  Mood and affect are appropriate.  Cranial nerves: Pupils are equal and briskly reactive to light. Funduscopic exam without  evidence of pallor or edema. Extraocular movements  in vertical and horizontal planes intact and without nystagmus. Visual fields by finger perimetry are intact. Hearing to finger rub intact.   Facial sensation intact to fine touch.  Facial motor strength is symmetric and tongue and uvula move midline. Shoulder shrug was symmetrical.   Motor exam:  Tone, muscle bulk and symmetric strength in all extremities. Sensory:  Fine touch, pinprick and vibration and  Proprioception tested in the upper extremities was normal. Coordination: Rapid alternating movements in the fingers/hands was normal. Finger-to-nose maneuver  normal without evidence of ataxia, dysmetria or tremor. Gait and station: Patient walks without assistive device and is able unassisted to climb up to the exam table. Strength within normal limits. Stance is stable and normal.   Deep tendon reflexes: in the  upper and lower extremities are symmetric and intact. Babinski maneuver response is downgoing.  The patient was advised of the nature  of the diagnosed sleep disorder , the treatment options and risks for general a health and wellness arising from not treating the condition.  I spent more than 40 minutes of face to face time with the patient. Greater than 50% of time was spent in counseling and coordination of care. We have discussed the diagnosis and differential and I answered the patient's questions.     Assessment:  After physical and neurologic examination, review of laboratory studies,  Personal review of imaging studies, reports of other /same  Imaging studies ,  Results of polysomnography/ neurophysiology testing and pre-existing records as far as provided in visit., my assessment is   1) Mr. Richard Richard is likely still suffering from obstructive sleep apnea as he has described them as his wife has also witnessed. I would strongly recommend that his turbinate reduction and nasal septoplasty should be performed as it will help him to tolerate nasal CPAP better. He has been using CPAP now for about 14 years and is urgently in need of a new machine. For this he has to requalify for the apnea has to be retested and confirmed, but I have no doubt that we will perform a split-night polysomnography with titration in the same night for him.   I like to schedule his study as soon as possible.   Due to his comorbidities of sinusitis, otitis, pharyngitis his headaches may already be explained well, but I still would prefer to look out for hypoxia at night and if not corrected by CPAP alone I would prefer to have oxygen titrated as well. I will follow-up with Mr. Richard Richard as soon as the sleep study is completed. He will obtain a dream where machine by [employer] and we will fit him with the interface that is best tolerated at this time. He may need a heated Hose ( coiled)  to allow him a high humidity level without condensation    Plan:  Treatment plan and additional workup :  SPLIT night, CO2 and nasal passage obstruction.  ENT procedure is  not yet scheduled.    Porfirio Mylararmen  Ahmar Pickrell MD  11/25/2015   CC: Gillian Scarce, Md [address] [place], [place] [address]

## 2015-12-19 ENCOUNTER — Inpatient Hospital Stay (HOSPITAL_BASED_OUTPATIENT_CLINIC_OR_DEPARTMENT_OTHER)
Admission: EM | Admit: 2015-12-19 | Discharge: 2015-12-22 | DRG: 287 | Disposition: A | Discharge status: Home / Self Care | Payer: BLUE CROSS/BLUE SHIELD | Attending: Internal Medicine | Admitting: Internal Medicine

## 2015-12-19 ENCOUNTER — Emergency Department (HOSPITAL_BASED_OUTPATIENT_CLINIC_OR_DEPARTMENT_OTHER): Payer: BLUE CROSS/BLUE SHIELD

## 2015-12-19 ENCOUNTER — Encounter (HOSPITAL_BASED_OUTPATIENT_CLINIC_OR_DEPARTMENT_OTHER): Payer: Self-pay | Admitting: Emergency Medicine

## 2015-12-19 DIAGNOSIS — R7989 Other specified abnormal findings of blood chemistry: Secondary | ICD-10-CM | POA: Diagnosis not present

## 2015-12-19 DIAGNOSIS — I248 Other forms of acute ischemic heart disease: Secondary | ICD-10-CM | POA: Diagnosis present

## 2015-12-19 DIAGNOSIS — I471 Supraventricular tachycardia, unspecified: Secondary | ICD-10-CM | POA: Diagnosis present

## 2015-12-19 DIAGNOSIS — R079 Chest pain, unspecified: Secondary | ICD-10-CM | POA: Diagnosis present

## 2015-12-19 DIAGNOSIS — I2510 Atherosclerotic heart disease of native coronary artery without angina pectoris: Secondary | ICD-10-CM | POA: Diagnosis present

## 2015-12-19 DIAGNOSIS — Z9889 Other specified postprocedural states: Secondary | ICD-10-CM | POA: Diagnosis present

## 2015-12-19 DIAGNOSIS — E785 Hyperlipidemia, unspecified: Secondary | ICD-10-CM | POA: Diagnosis present

## 2015-12-19 DIAGNOSIS — Z7982 Long term (current) use of aspirin: Secondary | ICD-10-CM | POA: Diagnosis not present

## 2015-12-19 DIAGNOSIS — G4730 Sleep apnea, unspecified: Secondary | ICD-10-CM | POA: Diagnosis present

## 2015-12-19 DIAGNOSIS — K219 Gastro-esophageal reflux disease without esophagitis: Secondary | ICD-10-CM | POA: Diagnosis present

## 2015-12-19 DIAGNOSIS — R778 Other specified abnormalities of plasma proteins: Secondary | ICD-10-CM | POA: Diagnosis present

## 2015-12-19 DIAGNOSIS — Z79899 Other long term (current) drug therapy: Secondary | ICD-10-CM | POA: Diagnosis not present

## 2015-12-19 HISTORY — DX: Sleep apnea, unspecified: G47.30

## 2015-12-19 LAB — BASIC METABOLIC PANEL
ANION GAP: 5 (ref 5–15)
BUN: 18 mg/dL (ref 6–20)
CHLORIDE: 106 mmol/L (ref 101–111)
CO2: 25 mmol/L (ref 22–32)
Calcium: 9.2 mg/dL (ref 8.9–10.3)
Creatinine, Ser: 1.39 mg/dL — ABNORMAL HIGH (ref 0.61–1.24)
GFR calc Af Amer: >60 mL/min (ref >60)
GFR calc non Af Amer: 54 mL/min — ABNORMAL LOW (ref >60)
GLUCOSE: 145 mg/dL — AB (ref 65–99)
POTASSIUM: 4.3 mmol/L (ref 3.5–5.1)
Sodium: 136 mmol/L (ref 135–145)

## 2015-12-19 LAB — CBC
HEMATOCRIT: 49.3 % (ref 39.0–52.0)
HEMOGLOBIN: 16.7 g/dL (ref 13.0–17.0)
MCH: 29.2 pg (ref 26.0–34.0)
MCHC: 33.9 g/dL (ref 30.0–36.0)
MCV: 86.2 fL (ref 78.0–100.0)
Platelets: 220 10*3/uL (ref 150–400)
RBC: 5.72 MIL/uL (ref 4.22–5.81)
RDW: 13.7 % (ref 11.5–15.5)
WBC: 6.9 10*3/uL (ref 4.0–10.5)

## 2015-12-19 LAB — TROPONIN I
TROPONIN I: 1.53 ng/mL — AB (ref <0.031)
Troponin I: 0.03 ng/mL (ref <0.031)
Troponin I: 0.92 ng/mL (ref <0.031)

## 2015-12-19 MED ORDER — METOPROLOL TARTRATE 25 MG PO TABS
25.0000 mg | ORAL_TABLET | Freq: Two times a day (BID) | ORAL | Status: DC
  Administered 2015-12-19 – 2015-12-22 (×6): 25 mg via ORAL
  Filled 2015-12-19 (×6): qty 1

## 2015-12-19 MED ORDER — ONDANSETRON HCL 4 MG/2ML IJ SOLN: 4.0000 mg | Freq: Four times a day (QID) | INTRAMUSCULAR | Status: DC | PRN

## 2015-12-19 MED ORDER — ACETAMINOPHEN 325 MG PO TABS
650.0000 mg | ORAL_TABLET | ORAL | Status: DC | PRN
  Administered 2015-12-19 – 2015-12-21 (×4): 650 mg via ORAL
  Filled 2015-12-19 (×5): qty 2

## 2015-12-19 MED ORDER — ADENOSINE 6 MG/2ML IV SOLN
INTRAVENOUS | Status: AC
  Filled 2015-12-19: qty 4

## 2015-12-19 MED ORDER — PANTOPRAZOLE SODIUM 40 MG PO TBEC
40.0000 mg | DELAYED_RELEASE_TABLET | Freq: Every day | ORAL | Status: DC
  Administered 2015-12-20 – 2015-12-22 (×3): 40 mg via ORAL
  Filled 2015-12-19 (×3): qty 1

## 2015-12-19 MED ORDER — VITAMIN D 1000 UNITS PO TABS
1000.0000 [IU] | ORAL_TABLET | ORAL | Status: DC
  Administered 2015-12-19 – 2015-12-20 (×5): 1000 [IU] via ORAL
  Filled 2015-12-19 (×3): qty 1

## 2015-12-19 MED ORDER — ADENOSINE 6 MG/2ML IV SOLN
INTRAVENOUS | Status: AC | PRN
  Administered 2015-12-19: 6 mg via INTRAVENOUS

## 2015-12-19 MED ORDER — HEPARIN (PORCINE) IN NACL 100-0.45 UNIT/ML-% IJ SOLN
1200.0000 [IU]/h | INTRAMUSCULAR | Status: DC
  Administered 2015-12-19: 1200 [IU]/h via INTRAVENOUS
  Filled 2015-12-19: qty 250

## 2015-12-19 MED ORDER — ASPIRIN EC 81 MG PO TBEC
81.0000 mg | DELAYED_RELEASE_TABLET | Freq: Every day | ORAL | Status: DC
  Administered 2015-12-20 – 2015-12-22 (×3): 81 mg via ORAL
  Filled 2015-12-19 (×3): qty 1

## 2015-12-19 MED ORDER — LORATADINE 10 MG PO TABS
10.0000 mg | ORAL_TABLET | Freq: Every day | ORAL | Status: DC
  Administered 2015-12-19 – 2015-12-22 (×4): 10 mg via ORAL
  Filled 2015-12-19 (×4): qty 1

## 2015-12-19 MED ORDER — SODIUM CHLORIDE 0.9 % IV SOLN
INTRAVENOUS | Status: AC | PRN
  Administered 2015-12-19: 1000 mL via INTRAVENOUS

## 2015-12-19 MED ORDER — ASPIRIN 81 MG PO CHEW
324.0000 mg | CHEWABLE_TABLET | Freq: Once | ORAL | Status: AC
  Administered 2015-12-19: 324 mg via ORAL
  Filled 2015-12-19: qty 4

## 2015-12-19 MED ORDER — HEPARIN BOLUS VIA INFUSION
4000.0000 [IU] | Freq: Once | INTRAVENOUS | Status: AC
  Administered 2015-12-19: 4000 [IU] via INTRAVENOUS

## 2015-12-19 NOTE — ED Provider Notes (Signed)
CSN: [id number]     Arrival date & time 12/19/15  1009 History   First MD Initiated Contact with Patient 12/19/15 1014     Chief Complaint  Patient presents with  . Chest Pain     (Consider location/radiation/quality/duration/timing/severity/associated sxs/prior Treatment) HPI Patient developed racing heart and chest pressure at 8:30. He reports he felt short of breath and nauseated. The patient has had elevated heart rate before. He tried 2 doses of his metoprolol. He is not getting any relief and continues to have chest pressure, weakness, nausea and shortness of breath. Patient had been feeling well before symptoms onset. Past Medical History  Diagnosis Date  . Hyperlipidemia   . GERD (gastroesophageal reflux disease)   . DDD (degenerative disc disease), lumbar     L3/L4  . Diverticulosis   . Low sperm motility   . SVT (supraventricular tachycardia) (HCC)   . Sleep apnea    Past Surgical History  Procedure Laterality Date  . No past surgeries    . Back surgery     Family History  Problem Relation Age of Onset  . Hypertension Father    Social History  Substance Use Topics  . Smoking status: Former Games developermoker  . Smokeless tobacco: Current User     Comment: Uses Hookah Pipe occasionally  . Alcohol Use: No    Review of Systems  10 Systems reviewed and are negative for acute change except as noted in the HPI.   Allergies  Review of patient's allergies indicates no known allergies.  Home Medications   Prior to Admission medications   Medication Sig Start Date End Date Taking? Authorizing Provider  aspirin 81 MG tablet Take 81 mg by mouth daily.    Historical Provider, MD  cholecalciferol (VITAMIN D) 1000 UNITS tablet Take 1,000 Units by mouth as directed.     Historical Provider, MD  esomeprazole (NEXIUM) 40 MG capsule Take 1 capsule (40 mg total) by mouth daily before breakfast. 03/29/14 09/18/15  Gillian Scarceobyn K Zanard, MD  ibuprofen (ADVIL,MOTRIN) 200 MG tablet Take 400-600 mg  by mouth as directed.    Historical Provider, MD  loratadine (CLARITIN) 10 MG tablet Take 10 mg by mouth daily.    Historical Provider, MD  metoprolol tartrate (LOPRESSOR) 25 MG tablet Take 1 tablet (25 mg total) by mouth 2 (two) times daily. 09/23/15   Marinus MawGregg W Taylor, MD  Vitamin D, Ergocalciferol, (DRISDOL) 50000 UNITS CAPS capsule Take 50,000 Units by mouth 2 (two) times a week. Reported on 09/23/2015 02/27/15 02/28/16  Historical Provider, MD   BP 128/94 mmHg  Pulse 64  Temp(Src) 97.9 F (36.6 C) (Oral)  Resp 24  Ht 5\' 10"  (1.778 m)  Wt 246 lb 0.5 oz (111.6 kg)  BMI 35.30 kg/m2  SpO2 100% Physical Exam  Constitutional: He is oriented to person, place, and time.  Patient is diaphoretic in appearance. He is moderately dyspneic. Color is pale. Mental status is clear.  HENT:  Mouth/Throat: Oropharynx is clear and moist.  Eyes: EOM are normal.  Cardiovascular: Intact distal pulses.   Tachycardia.  Pulmonary/Chest:  Mild tachypnea. Lung fields grossly clear. No gross wheeze rhonchi rail.  Abdominal: Soft. He exhibits no distension. There is no tenderness.  Musculoskeletal: Normal range of motion. He exhibits no edema or tenderness.  Neurological: He is alert and oriented to person, place, and time. He exhibits normal muscle tone. Coordination normal.  Skin: There is pallor.  Diaphoretic  Psychiatric:  Anxious    ED Course  Procedures (  including critical care time) CRITICAL CARE Performed by: Arby Barrette   Total critical care time: 45 minutes  Critical care time was exclusive of separately billable procedures and treating other patients.  Critical care was necessary to treat or prevent imminent or life-threatening deterioration.  Critical care was time spent personally by me on the following activities: development of treatment plan with patient and/or surrogate as well as nursing, discussions with consultants, evaluation of patient's response to treatment, examination of  patient, obtaining history from patient or surrogate, ordering and performing treatments and interventions, ordering and review of laboratory studies, ordering and review of radiographic studies, pulse oximetry and re-evaluation of patient's condition.  Upon arrival, patient was placed on monitor and IV established. 6 mg of adenosine administered IV push. Patient had conversion to normal sinus rhythm. He tolerated this well. A liter of fluids administered. Blood pressures and heart rate remained stable.  Labs Review Labs Reviewed  BASIC METABOLIC PANEL - Abnormal; Notable for the following:    Glucose, Bld 145 (*)    Creatinine, Ser 1.39 (*)    GFR calc non Af Amer 54 (*)    All other components within normal limits  TROPONIN I - Abnormal; Notable for the following:    Troponin I 0.92 (*)    All other components within normal limits  CBC  TROPONIN I    Imaging Review Dg Chest Port 1 View  12/19/2015  CLINICAL DATA:  Acute onset centralized chest pain radiating to right shoulder. Shortness of breath, diaphoresis EXAM: PORTABLE CHEST 1 VIEW COMPARISON:  09/18/2015 FINDINGS: Heart is borderline in size. Mild vascular congestion and interstitial prominence. No confluent opacities or effusions. No acute bony abnormality. IMPRESSION: Borderline heart size with mild vascular congestion and increased interstitial prominence. Cannot exclude interstitial edema. Electronically Signed   By: Charlett Nose M.D.   On: 12/19/2015 10:58   I have personally reviewed and evaluated these images and lab results as part of my medical decision-making.   EKG Interpretation   Date/Time:  Friday December 19 2015 10:17:18 EDT Ventricular Rate:  90 PR Interval:  176 QRS Duration: 92 QT Interval:  328 QTC Calculation: 401 R Axis:   45 Text Interpretation:  Sinus rhythm Abnormal R-wave progression, early  transition Borderline ST depression, anterolateral leads Baseline wander  in lead(s) I II III aVL aVF V1 V3  agree. no STEMI. no sig change from old  Confirmed by Donnald Garre, MD, Lebron Conners [phone]) on 12/19/2015 12:29:23 PM     Consult: Reviewed Dr. Herbie Baltimore. Accepts the patient for transfer to telemetry. Agrees with plan to initiate heparin for ACS based on elevated troponin. MDM   Final diagnoses:  SVT (supraventricular tachycardia) (HCC)  Troponin I above reference range   Patient presents with SVT. He also had chest pain with shortness of breath and diaphoresis. Patient responded immediately to adenosine 6 mg. He converted to sinus rhythm with stable blood pressure. Patient's dyspnea and chest pain resolved after conversion. He did however have a elevated 3 hour troponin at 0.9. This was reviewed with Dr. Herbie Baltimore who agrees this is a significant elevation for SVT. At this time patient will be started on heparin for ACS\NSTEMI. Patient has not had any further chest pain. Vital signs have remained stable. Patient will be transferred to [facility] for ongoing monitoring and diagnostic evaluation by cardiology.    Arby Barrette, MD 12/19/15 [phone]

## 2015-12-19 NOTE — Progress Notes (Signed)
CRITICAL VALUE ALERT  Critical value received: trop 1.53  Date of notification:  12/19/2015  Time of notification:  1930  Critical value read back:yes  Nurse who received alert:Dodi Leu Manson PasseyBrown LPN  MD notified (1st page):  Yes Dr. Gracy RacerMc Dowell  Time of first page:  1947  MD notified (2nd page):  Time of second page:  Responding MD:  DR. Diona BrownerMcDowell  Time MD responded:  [phone]

## 2015-12-19 NOTE — Progress Notes (Signed)
ANTICOAGULATION CONSULT NOTE - Initial Consult  Pharmacy Consult for heparin Indication: chest pain/ACS  No Known Allergies  Patient Measurements: Height: 5\' 10"  (177.8 cm) Weight: 246 lb 0.5 oz (111.6 kg) IBW/kg (Calculated) : 73 Heparin Dosing Weight: 97kg  Vital Signs: Temp: 97.9 F (36.6 C) (04/28 1021) Temp Source: Oral (04/28 1021) BP: 105/70 mmHg (04/28 1430) Pulse Rate: 52 (04/28 1430)  Labs:  Recent Labs  12/19/15 1015 12/19/15 1330  HGB 16.7  --   HCT 49.3  --   PLT 220  --   CREATININE 1.39*  --   TROPONINI 0.03 0.92*    Estimated Creatinine Clearance: 71.5 mL/min (by C-G formula based on Cr of 1.39).   Medical History: Past Medical History  Diagnosis Date  . Hyperlipidemia   . GERD (gastroesophageal reflux disease)   . DDD (degenerative disc disease), lumbar     L3/L4  . Diverticulosis   . Low sperm motility   . SVT (supraventricular tachycardia) (HCC)   . Sleep apnea     Medications:  Infusions:  . heparin      Assessment: 59 yom presented to the ED with CP. Found to have an elevated troponin, to start IV heparin. Baseline CBC is WNL and he is not on anticoagulation PTA.   Goal of Therapy:  Heparin level 0.3-0.7 units/ml Monitor platelets by anticoagulation protocol: Yes   Plan:  - Heparin bolus 4000 units IV x 1 - Heparin gtt 1200 units/hr - Check a 6 hour heparin level - Daily heparin level and CBC  Jenette Rayson, Drake Leachachel Lynn 12/19/2015,3:02 PM

## 2015-12-19 NOTE — ED Notes (Signed)
Patient up to restroom.

## 2015-12-19 NOTE — H&P (Signed)
Patient ID: Richard Walker MRN: [id number] DOB/AGE: 60/16/1957 60 y.o. Admit date: 12/19/2015  Primary Care Physician: Donnald Garre Primary Cardiologist: Ladona Ridgel  HPI: 60 yo male with history of HLD, GERD, SVT and sleep apnea who is transferred to [facility] from [facility] where he presented today with c/o chest pain, dyspnea. He woke up at 8am with his heart racing and tried multiple vagal maneuvers but could not break the tachycardia. He was found to have SVT and was converted to sinus with IV adenosine. I personally reviewed this EKG. SVT at rate of 171 bpm. Troponin elevated so he is being admitted for monitoring. He is known to have SVT. He has had 4 episodes last 2 years. He has been followed by [name] in our EP clinic and has been on metoprolol. He admits to missing doses of metoprolol several times weekly.   He tells me that he feels well now. He has no chest pain or dyspnea. He does note over the last few months that he becomes fatigued very easily. He has no chest pain with exertion but excessive sweating and fatigue with occasional dizziness.   Review of systems complete and found to be negative unless listed above   Past Medical History  Diagnosis Date  . Hyperlipidemia   . GERD (gastroesophageal reflux disease)   . DDD (degenerative disc disease), lumbar     L3/L4  . Diverticulosis   . Low sperm motility   . SVT (supraventricular tachycardia) (HCC)   . Sleep apnea     uses cpap     Family History  Problem Relation Age of Onset  . Hypertension Father     Social History   Social History  . Marital Status: Married    Spouse Name: N/A  . Number of Children: N/A  . Years of Education: N/A   Occupational History  . Not on file.   Social History Main Topics  . Smoking status: Former Games developer  . Smokeless tobacco: Never Used     Comment: Uses Hookah Pipe occasionally  . Alcohol Use: No  . Drug Use: No  . Sexual Activity: Yes   Other Topics Concern  . Not  on file   Social History Narrative   Marital Status: Married Engineer, drilling)   Children:  4 (3 Sons/1 Daughter)   Pets:  None    Living Situation: Lives with spouse and 4 children   Origin:  He was born in [place].   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     Alcohol Use:  None   Drug Use:  None   Diet:  Regular   Exercise:  None   Hobbies:  Cards    Past Surgical History  Procedure Laterality Date  . Back surgery    . Wisdom tooth extraction      No Known Allergies  Prior to Admission Meds:  Prior to Admission medications   Medication Sig Start Date End Date Taking? Authorizing Provider  aspirin 81 MG tablet Take 81 mg by mouth daily.    Historical Provider, MD  cholecalciferol (VITAMIN D) 1000 UNITS tablet Take 1,000 Units by mouth as directed.     Historical Provider, MD  esomeprazole (NEXIUM) 40 MG capsule Take 1 capsule (40 mg total) by mouth daily before breakfast. 03/29/14 09/18/15  Gillian Scarce, MD  ibuprofen (ADVIL,MOTRIN) 200 MG tablet Take 400-600 mg by mouth  as directed.    Historical Provider, MD  loratadine (CLARITIN) 10 MG tablet Take 10 mg by mouth daily.    Historical Provider, MD  metoprolol tartrate (LOPRESSOR) 25 MG tablet Take 1 tablet (25 mg total) by mouth 2 (two) times daily. 09/23/15   Marinus Maw, MD  Vitamin D, Ergocalciferol, (DRISDOL) 50000 UNITS CAPS capsule Take 50,000 Units by mouth 2 (two) times a week. Reported on 09/23/2015 02/27/15 02/28/16  Historical Provider, MD    Physical Exam: Blood pressure 144/78, pulse 61, temperature 98.1 F (36.7 C), temperature source Oral, resp. rate 16, height 5\' 9"  (1.753 m), weight 242 lb 14.4 oz (110.179 kg), SpO2 98 %.    General: Well developed, well nourished, NAD  HEENT: OP clear, mucus membranes moist  SKIN: warm, dry. No rashes.  Neuro: No focal deficits  Musculoskeletal:  Muscle strength 5/5 all ext  Psychiatric: Mood and affect normal  Neck: No JVD, no carotid bruits, no thyromegaly, no lymphadenopathy.  Lungs:Clear bilaterally, no wheezes, rhonci, crackles  Cardiovascular: Regular rate and rhythm. No murmurs, gallops or rubs.  Abdomen:Soft. Bowel sounds present. Non-tender.  Extremities: No lower extremity edema. Pulses are 2 + in the bilateral DP/PT.   Labs:   Lab Results  Component Value Date   WBC 6.9 12/19/2015   HGB 16.7 12/19/2015   HCT 49.3 12/19/2015   MCV 86.2 12/19/2015   PLT 220 12/19/2015     Recent Labs Lab 12/19/15 1015  NA 136  K 4.3  CL 106  CO2 25  BUN 18  CREATININE 1.39*  CALCIUM 9.2  GLUCOSE 145*   Lab Results  Component Value Date             TROPONINI 0.92* 12/19/2015    No results found for: LDLDIRECT    Radiology:  Chest x-ray:  Borderline heart size with mild vascular congestion and increased interstitial prominence. Cannot exclude interstitial edema.  EKG: First EKG with SVT at 171 bpm Post IV adenosine: sinus  ASSESSMENT AND PLAN:   1. SVT: He is known to have PSVT. He has missed doses of his metoprolol this week. He is in sinus now. I will restart his metoprolol. He had a normal echo August 2016. I do not think we need to repeat his echo at this time. Given multiple episodes of SVT, he should be considered for SVT ablation. He can have this discussion with DR. Taylor at f/u.   2. Elevated troponin: This is likely demand ischemia in setting of SVT. Will cycle troponin. If there is a flat trend, would d/c in am. Given recent exertional fatigue and dyspnea, would consider outpatient nuclear stress test. He is not known to have CAD.   Earney Hamburg, MD 12/19/2015, 6:17 PM

## 2015-12-19 NOTE — ED Notes (Signed)
Central chest pain started at 0830 this am.  Some sob, diaphoresis and nausea.  Pt took two metoprolol which is his usual medications.

## 2015-12-20 LAB — BASIC METABOLIC PANEL
Anion gap: 7 (ref 5–15)
BUN: 8 mg/dL (ref 6–20)
CALCIUM: 9.1 mg/dL (ref 8.9–10.3)
CO2: 28 mmol/L (ref 22–32)
CREATININE: 1.16 mg/dL (ref 0.61–1.24)
Chloride: 104 mmol/L (ref 101–111)
GFR calc Af Amer: >60 mL/min (ref >60)
GLUCOSE: 106 mg/dL — AB (ref 65–99)
Potassium: 4.4 mmol/L (ref 3.5–5.1)
Sodium: 139 mmol/L (ref 135–145)

## 2015-12-20 LAB — CBC
HEMATOCRIT: 47.5 % (ref 39.0–52.0)
Hemoglobin: 15.5 g/dL (ref 13.0–17.0)
MCH: 28.5 pg (ref 26.0–34.0)
MCHC: 32.6 g/dL (ref 30.0–36.0)
MCV: 87.5 fL (ref 78.0–100.0)
PLATELETS: 156 10*3/uL (ref 150–400)
RBC: 5.43 MIL/uL (ref 4.22–5.81)
RDW: 13.4 % (ref 11.5–15.5)
WBC: 5.5 10*3/uL (ref 4.0–10.5)

## 2015-12-20 LAB — TROPONIN I: TROPONIN I: 0.91 ng/mL — AB (ref <0.031)

## 2015-12-20 LAB — HEPARIN LEVEL (UNFRACTIONATED): HEPARIN UNFRACTIONATED: 0.57 [IU]/mL (ref 0.30–0.70)

## 2015-12-20 MED ORDER — VITAMIN D (ERGOCALCIFEROL) 1.25 MG (50000 UNIT) PO CAPS
50000.0000 [IU] | ORAL_CAPSULE | ORAL | Status: DC
  Filled 2015-12-20: qty 1

## 2015-12-20 MED ORDER — HEPARIN (PORCINE) IN NACL 100-0.45 UNIT/ML-% IJ SOLN
1200.0000 [IU]/h | INTRAMUSCULAR | Status: DC
  Administered 2015-12-20: 1200 [IU]/h via INTRAVENOUS
  Filled 2015-12-20 (×4): qty 250

## 2015-12-20 MED ORDER — HEPARIN BOLUS VIA INFUSION
4000.0000 [IU] | Freq: Once | INTRAVENOUS | Status: AC
  Administered 2015-12-20: 4000 [IU] via INTRAVENOUS
  Filled 2015-12-20: qty 4000

## 2015-12-20 NOTE — Progress Notes (Signed)
Subjective:  SVT broke yesterday but felt poorly following that with pressure and headache.  Notes that recently increased exertional fatigue and dyspnea.  Concerned with possiblity of blockage.  Objective:  Vital Signs in the last 24 hours: BP 120/76 mmHg  Pulse 59  Temp(Src) 97.5 F (36.4 C) (Oral)  Resp 18  Ht 5\' 9"  (1.753 m)  Wt 109.8 kg (242 lb 1 oz)  BMI 35.73 kg/m2  SpO2 97%  Physical Exam: Pleasant middle [place] male in NAD Lungs:  Clear Cardiac:  Regular rhythm, normal S1 and S2, no S3 Abdomen:  Soft, nontender, no masses Extremities:  No edema present  Intake/Output from previous day: 04/28 0701 - 04/29 0700 In: 240 [P.O.:240] Out: -   Weight Filed Weights   12/19/15 1430 12/19/15 1632 12/20/15 0559  Weight: 111.6 kg (246 lb 0.5 oz) 110.179 kg (242 lb 14.4 oz) 109.8 kg (242 lb 1 oz)    Lab Results: Basic Metabolic Panel:  Recent Labs  08/65/7804/28/17 1015 12/20/15 0713  NA 136 139  K 4.3 4.4  CL 106 104  CO2 25 28  GLUCOSE 145* 106*  BUN 18 8  CREATININE 1.39* 1.16   CBC:  Recent Labs  12/19/15 1015 12/20/15 0713  WBC 6.9 5.5  HGB 16.7 15.5  HCT 49.3 47.5  MCV 86.2 87.5  PLT 220 156   Cardiac Panel (last 3 results)  Recent Labs  12/19/15 1330 12/19/15 1832 12/20/15 0301  TROPONINI 0.92* 1.53* 0.91*    Telemetry: Sinus bradycardia - no recurrent SVT   Assessment/Plan:  1. Non STEMI with rise in troponin mildly and subsequent fall. Could be demand ischemia Due to SVT but the presentation following termination was different than before and he has had symptoms suggestive of fatigue and possible angina prior to this.  Recommendations:  I did suggest that we keep him in the hospital and plan to do catheterization on Monday.  It's important to exclude CAD.  He might be a candidate for a SVT ablation at some point.     Darden PalmerW. Spencer Tilley, Jr.  MD [facility] HospitalFACC Cardiology  12/20/2015, 10:00 AM

## 2015-12-20 NOTE — Progress Notes (Signed)
Placed patient on CPAP for the night at his home setting of 8cm H20.  Patient is tolerating well at this time.

## 2015-12-20 NOTE — Progress Notes (Signed)
ANTICOAGULATION CONSULT NOTE - Initial Consult  Pharmacy Consult for heparin Indication: chest pain/ACS  Allergies  Allergen Reactions  . Pork-Derived Products Swelling    Patient Measurements: Height: 5\' 9"  (175.3 cm) Weight: 242 lb 1 oz (109.8 kg) IBW/kg (Calculated) : 70.7 Heparin Dosing Weight: 97kg  Vital Signs: Temp: 97.8 F (36.6 C) (04/29 1233) Temp Source: Oral (04/29 1233) BP: 141/89 mmHg (04/29 1233) Pulse Rate: 55 (04/29 1233)  Labs:  Recent Labs  12/19/15 1015 12/19/15 1330 12/19/15 1832 12/20/15 0301 12/20/15 0713 12/20/15 1814  HGB 16.7  --   --   --  15.5  --   HCT 49.3  --   --   --  47.5  --   PLT 220  --   --   --  156  --   HEPARINUNFRC  --   --   --   --   --  0.57  CREATININE 1.39*  --   --   --  1.16  --   TROPONINI 0.03 0.92* 1.53* 0.91*  --   --     Estimated Creatinine Clearance: 83.7 mL/min (by C-G formula based on Cr of 1.16).   Medical History: Past Medical History  Diagnosis Date  . Hyperlipidemia   . GERD (gastroesophageal reflux disease)   . DDD (degenerative disc disease), lumbar     L3/L4  . Diverticulosis   . Low sperm motility   . SVT (supraventricular tachycardia) (HCC)   . Sleep apnea     uses cpap     Medications:  Infusions:  . heparin 1,200 Units/hr (12/20/15 1202)    Assessment: 59 yom presented to the ED with CP. Found to have an elevated troponin, to start IV heparin. Initiated 4/28 and subsequently discontinued by cards later that evening. Restarted by cards this morning. H/H 15.5/47.5, plts 220>156, troponin 0.92>1.53>0.91. No anticoagulation PTA.   Initial HL is therapeutic at 0.57 on heparin 1200 units/hr. No issues with infusion or bleeding noted.  Goal of Therapy:  Heparin level 0.3-0.7 units/ml Monitor platelets by anticoagulation protocol: Yes   Plan:  Continue heparin gtt 1200 units/hr Daily heparin level and CBC Monitor s/sx bleeding  Arlean Hoppingorey M. Newman PiesBall, PharmD, BCPS Clinical  Pharmacist Pager [phone] 7:28 PM, 12/20/2015

## 2015-12-20 NOTE — Progress Notes (Signed)
ANTICOAGULATION CONSULT NOTE - Initial Consult  Pharmacy Consult for heparin Indication: chest pain/ACS  Allergies  Allergen Reactions  . Pork-Derived Products Swelling    Patient Measurements: Height: 5\' 9"  (175.3 cm) Weight: 242 lb 1 oz (109.8 kg) IBW/kg (Calculated) : 70.7 Heparin Dosing Weight: 97kg  Vital Signs: Temp: 97.5 F (36.4 C) (04/29 0559) Temp Source: Oral (04/29 0559) BP: 120/76 mmHg (04/29 0929) Pulse Rate: 59 (04/29 0929)  Labs:  Recent Labs  12/19/15 1015 12/19/15 1330 12/19/15 1832 12/20/15 0301 12/20/15 0713  HGB 16.7  --   --   --  15.5  HCT 49.3  --   --   --  47.5  PLT 220  --   --   --  156  CREATININE 1.39*  --   --   --  1.16  TROPONINI 0.03 0.92* 1.53* 0.91*  --     Estimated Creatinine Clearance: 83.7 mL/min (by C-G formula based on Cr of 1.16).   Medical History: Past Medical History  Diagnosis Date  . Hyperlipidemia   . GERD (gastroesophageal reflux disease)   . DDD (degenerative disc disease), lumbar     L3/L4  . Diverticulosis   . Low sperm motility   . SVT (supraventricular tachycardia) (HCC)   . Sleep apnea     uses cpap     Medications:  Infusions:  . heparin      Assessment: 59 yom presented to the ED with CP. Found to have an elevated troponin, to start IV heparin. Initiated 4/28 and subsequently discontinued by cards later that evening. Restarted by cards this morning. H/H 15.5/47.5, plts 220>156, troponin 0.92>1.53>0.91. No anticoagulation PTA.   Goal of Therapy:  Heparin level 0.3-0.7 units/ml Monitor platelets by anticoagulation protocol: Yes   Plan:  - Heparin bolus 4000 units IV x 1 - Heparin gtt 1200 units/hr - Check a 6 hour heparin level - Daily heparin level and CBC - Monitor s/sx bleeding  Sherle Poeob Mahmoud Blazejewski, PharmD Clinical Pharmacy Resident 10:31 AM, 12/20/2015

## 2015-12-21 LAB — CBC
HEMATOCRIT: 48.1 % (ref 39.0–52.0)
Hemoglobin: 15.8 g/dL (ref 13.0–17.0)
MCH: 28.9 pg (ref 26.0–34.0)
MCHC: 32.8 g/dL (ref 30.0–36.0)
MCV: 87.9 fL (ref 78.0–100.0)
Platelets: 190 10*3/uL (ref 150–400)
RBC: 5.47 MIL/uL (ref 4.22–5.81)
RDW: 13.2 % (ref 11.5–15.5)
WBC: 7.1 10*3/uL (ref 4.0–10.5)

## 2015-12-21 LAB — HEPARIN LEVEL (UNFRACTIONATED): Heparin Unfractionated: 0.46 IU/mL (ref 0.30–0.70)

## 2015-12-21 MED ORDER — SODIUM CHLORIDE 0.9% FLUSH: 3.0000 mL | Freq: Two times a day (BID) | INTRAVENOUS | Status: DC

## 2015-12-21 MED ORDER — SODIUM CHLORIDE 0.9% FLUSH: 3.0000 mL | INTRAVENOUS | Status: DC | PRN

## 2015-12-21 MED ORDER — SODIUM CHLORIDE 0.9 % WEIGHT BASED INFUSION
1.0000 mL/kg/h | INTRAVENOUS | Status: DC
  Administered 2015-12-22: 1 mL/kg/h via INTRAVENOUS

## 2015-12-21 MED ORDER — SODIUM CHLORIDE 0.9 % WEIGHT BASED INFUSION
3.0000 mL/kg/h | INTRAVENOUS | Status: DC
  Administered 2015-12-22: 3 mL/kg/h via INTRAVENOUS

## 2015-12-21 MED ORDER — SODIUM CHLORIDE 0.9 % IV SOLN: 250.0000 mL | INTRAVENOUS | Status: DC | PRN

## 2015-12-21 MED ORDER — ASPIRIN 81 MG PO CHEW
81.0000 mg | CHEWABLE_TABLET | ORAL | Status: AC
  Administered 2015-12-22: 81 mg via ORAL
  Filled 2015-12-21: qty 1

## 2015-12-21 NOTE — Progress Notes (Signed)
ANTICOAGULATION CONSULT NOTE - Initial Consult  Pharmacy Consult for heparin Indication: chest pain/ACS  Allergies  Allergen Reactions  . Pork-Derived Products Swelling    Patient Measurements: Height: 5\' 9"  (175.3 cm) Weight: 242 lb 1 oz (109.8 kg) IBW/kg (Calculated) : 70.7 Heparin Dosing Weight: 97kg  Vital Signs: Temp: 97.9 F (36.6 C) (04/30 0700) Temp Source: Oral (04/30 0700) BP: 113/74 mmHg (04/30 0700) Pulse Rate: 58 (04/30 0700)  Labs:  Recent Labs  12/19/15 1015 12/19/15 1330 12/19/15 1832 12/20/15 0301 12/20/15 0713 12/20/15 1814 12/21/15 0322  HGB 16.7  --   --   --  15.5  --  15.8  HCT 49.3  --   --   --  47.5  --  48.1  PLT 220  --   --   --  156  --  190  HEPARINUNFRC  --   --   --   --   --  0.57 0.46  CREATININE 1.39*  --   --   --  1.16  --   --   TROPONINI 0.03 0.92* 1.53* 0.91*  --   --   --     Estimated Creatinine Clearance: 83.7 mL/min (by C-G formula based on Cr of 1.16).   Medical History: Past Medical History  Diagnosis Date  . Hyperlipidemia   . GERD (gastroesophageal reflux disease)   . DDD (degenerative disc disease), lumbar     L3/L4  . Diverticulosis   . Low sperm motility   . SVT (supraventricular tachycardia) (HCC)   . Sleep apnea     uses cpap     Medications:  Infusions:  . heparin 1,200 Units/hr (12/20/15 1202)    Assessment: 59 yom presented to the ED with CP. Found to have an elevated troponin, to start IV heparin. Initiated 4/28 and subsequently discontinued by cards later that evening. Restarted by cards 4/29. HL therapeutic at 0.46. H/H WNL and stable. Plt 190. No anticoagulation PTA. No bleeding reported. Cath planned for 5/1.  Goal of Therapy:  Heparin level 0.3-0.7 units/ml Monitor platelets by anticoagulation protocol: Yes   Plan:  Continue heparin gtt 1200 units/hr Daily heparin level and CBC Monitor s/sx bleeding  Sherle Poeob Vincent, PharmD Clinical Pharmacy Resident 8:23 AM, 12/21/2015

## 2015-12-21 NOTE — Progress Notes (Signed)
Subjective:  No recurrence of supraventricular tachycardia overnight.  Denies angina at the present time.  Renal function has improved since admission.  Objective:  Vital Signs in the last 24 hours: BP 113/74 mmHg  Pulse 58  Temp(Src) 97.9 F (36.6 C) (Oral)  Resp 18  Ht 5\' 9"  (1.753 m)  Wt 109.8 kg (242 lb 1 oz)  BMI 35.73 kg/m2  SpO2 96%  Physical Exam: Pleasant middle east male in NAD Lungs:  Clear Cardiac:  Regular rhythm, normal S1 and S2, no S3 Abdomen:  Soft, nontender, no masses Extremities:  No edema present  Intake/Output from previous day: 04/29 0701 - 04/30 0700 In: 984.6 [P.O.:720; I.V.:264.6] Out: 1925 [Urine:1925]  Weight Filed Weights   12/19/15 1430 12/19/15 1632 12/20/15 0559  Weight: 111.6 kg (246 lb 0.5 oz) 110.179 kg (242 lb 14.4 oz) 109.8 kg (242 lb 1 oz)    Lab Results: Basic Metabolic Panel:  Recent Labs  04/54/0904/28/17 1015 12/20/15 0713  NA 136 139  K 4.3 4.4  CL 106 104  CO2 25 28  GLUCOSE 145* 106*  BUN 18 8  CREATININE 1.39* 1.16   CBC:  Recent Labs  12/20/15 0713 12/21/15 0322  WBC 5.5 7.1  HGB 15.5 15.8  HCT 47.5 48.1  MCV 87.5 87.9  PLT 156 190   Cardiac Panel (last 3 results)  Recent Labs  12/19/15 1330 12/19/15 1832 12/20/15 0301  TROPONINI 0.92* 1.53* 0.91*    Telemetry: Sinus bradycardia - no recurrent SVT   Assessment/Plan:  1. Non STEMI with rise in troponin mildly and subsequent fall. Could be demand ischemia Due to SVT but the presentation following termination was different than before and he has had symptoms suggestive of fatigue and possible angina prior to this.  Recommendations:  Discussed SVT again with patient.  He was somewhat concerned over complications over ablation and may be resistant to this and I did suggest to him a pill in the pocket approach might be helpful but to discuss this with Dr. Ladona Ridgelaylor.  Cardiac catheterization was discussed with the patient fully including risks of myocardial  infarction, death, stroke, bleeding, arrhythmia, dye allergy, renal insufficiency or bleeding.  The patient understands and is willing to proceed.  Possibility of intervention discussed with patient and he is agreeable.      Darden PalmerW. Spencer Tilley, Jr.  MD [facility]ACC Cardiology  12/21/2015, 8:55 AM

## 2015-12-22 ENCOUNTER — Encounter (HOSPITAL_COMMUNITY)
Admission: EM | Disposition: A | Discharge status: Home / Self Care | Payer: Self-pay | Source: Home / Self Care | Attending: Internal Medicine

## 2015-12-22 ENCOUNTER — Encounter (HOSPITAL_COMMUNITY): Payer: Self-pay | Admitting: Cardiology

## 2015-12-22 HISTORY — PX: CARDIAC CATHETERIZATION: SHX172

## 2015-12-22 LAB — CBC
HCT: 45.7 % (ref 39.0–52.0)
Hemoglobin: 14.8 g/dL (ref 13.0–17.0)
MCH: 28.2 pg (ref 26.0–34.0)
MCHC: 32.4 g/dL (ref 30.0–36.0)
MCV: 87 fL (ref 78.0–100.0)
PLATELETS: 192 10*3/uL (ref 150–400)
RBC: 5.25 MIL/uL (ref 4.22–5.81)
RDW: 13.1 % (ref 11.5–15.5)
WBC: 6.8 10*3/uL (ref 4.0–10.5)

## 2015-12-22 LAB — PROTIME-INR
INR: 1.13 (ref 0.00–1.49)
PROTHROMBIN TIME: 14.7 s (ref 11.6–15.2)

## 2015-12-22 LAB — HEPARIN LEVEL (UNFRACTIONATED): HEPARIN UNFRACTIONATED: 0.42 [IU]/mL (ref 0.30–0.70)

## 2015-12-22 SURGERY — LEFT HEART CATH AND CORONARY ANGIOGRAPHY
Anesthesia: LOCAL

## 2015-12-22 MED ORDER — METOPROLOL SUCCINATE ER 50 MG PO TB24: 50.0000 mg | ORAL_TABLET | Freq: Every day | ORAL | Status: DC

## 2015-12-22 MED ORDER — HEPARIN (PORCINE) IN NACL 2-0.9 UNIT/ML-% IJ SOLN
INTRAMUSCULAR | Status: DC | PRN
  Administered 2015-12-22: 1500 mL

## 2015-12-22 MED ORDER — FENTANYL CITRATE (PF) 100 MCG/2ML IJ SOLN
INTRAMUSCULAR | Status: AC
  Filled 2015-12-22: qty 2

## 2015-12-22 MED ORDER — HEPARIN SODIUM (PORCINE) 1000 UNIT/ML IJ SOLN
INTRAMUSCULAR | Status: AC
  Filled 2015-12-22: qty 1

## 2015-12-22 MED ORDER — MIDAZOLAM HCL 2 MG/2ML IJ SOLN
INTRAMUSCULAR | Status: DC | PRN
  Administered 2015-12-22: 1 mg via INTRAVENOUS

## 2015-12-22 MED ORDER — SODIUM CHLORIDE 0.9 % WEIGHT BASED INFUSION
3.0000 mL/kg/h | INTRAVENOUS | Status: AC
  Administered 2015-12-22: 3 mL/kg/h via INTRAVENOUS

## 2015-12-22 MED ORDER — LIDOCAINE HCL (PF) 1 % IJ SOLN
INTRAMUSCULAR | Status: DC | PRN
  Administered 2015-12-22: 1 mL

## 2015-12-22 MED ORDER — SODIUM CHLORIDE 0.9 % IV SOLN: 250.0000 mL | INTRAVENOUS | Status: DC | PRN

## 2015-12-22 MED ORDER — SODIUM CHLORIDE 0.9% FLUSH: 3.0000 mL | INTRAVENOUS | Status: DC | PRN

## 2015-12-22 MED ORDER — VERAPAMIL HCL 2.5 MG/ML IV SOLN
INTRAVENOUS | Status: AC
  Filled 2015-12-22: qty 2

## 2015-12-22 MED ORDER — IOPAMIDOL (ISOVUE-370) INJECTION 76%
INTRAVENOUS | Status: AC
  Filled 2015-12-22: qty 100

## 2015-12-22 MED ORDER — VERAPAMIL HCL 2.5 MG/ML IV SOLN
INTRAVENOUS | Status: DC | PRN
  Administered 2015-12-22: 12:00:00 via INTRA_ARTERIAL

## 2015-12-22 MED ORDER — FENTANYL CITRATE (PF) 100 MCG/2ML IJ SOLN
INTRAMUSCULAR | Status: DC | PRN
  Administered 2015-12-22: 25 ug via INTRAVENOUS

## 2015-12-22 MED ORDER — HEPARIN (PORCINE) IN NACL 2-0.9 UNIT/ML-% IJ SOLN
INTRAMUSCULAR | Status: AC
  Filled 2015-12-22: qty 1000

## 2015-12-22 MED ORDER — IOPAMIDOL (ISOVUE-370) INJECTION 76%
INTRAVENOUS | Status: DC | PRN
  Administered 2015-12-22: 75 mL via INTRA_ARTERIAL

## 2015-12-22 MED ORDER — MIDAZOLAM HCL 2 MG/2ML IJ SOLN
INTRAMUSCULAR | Status: AC
  Filled 2015-12-22: qty 2

## 2015-12-22 MED ORDER — HEPARIN SODIUM (PORCINE) 1000 UNIT/ML IJ SOLN
INTRAMUSCULAR | Status: DC | PRN
  Administered 2015-12-22: 5000 [IU] via INTRAVENOUS

## 2015-12-22 MED ORDER — LIDOCAINE HCL (PF) 1 % IJ SOLN
INTRAMUSCULAR | Status: AC
  Filled 2015-12-22: qty 30

## 2015-12-22 MED ORDER — SODIUM CHLORIDE 0.9% FLUSH
3.0000 mL | Freq: Two times a day (BID) | INTRAVENOUS | Status: DC
Start: 2015-12-22 — End: 2015-12-22

## 2015-12-22 SURGICAL SUPPLY — 11 items

## 2015-12-22 NOTE — Interval H&P Note (Signed)
History and Physical Interval Note:  12/22/2015 11:33 AM  Richard Walker  has presented today for surgery, with the diagnosis of unstable angina  The various methods of treatment have been discussed with the patient and family. After consideration of risks, benefits and other options for treatment, the patient has consented to  Procedure(s): Left Heart Cath and Coronary Angiography (N/A) as a surgical intervention .  The patient's history has been reviewed, patient examined, no change in status, stable for surgery.  I have reviewed the patient's chart and labs.  Questions were answered to the patient's satisfaction.   Cath Lab Visit (complete for each Cath Lab visit)  Clinical Evaluation Leading to the Procedure:   ACS: Yes.    Non-ACS:    Anginal Classification: CCS III  Anti-ischemic medical therapy: Minimal Therapy (1 class of medications)  Non-Invasive Test Results: No non-invasive testing performed  Prior CABG: No previous CABG        Theron Aristaeter [facility]-WentzvilleJordanMD,FACC 12/22/2015 11:33 AM

## 2015-12-22 NOTE — Discharge Instructions (Signed)

## 2015-12-22 NOTE — Progress Notes (Signed)
ANTICOAGULATION CONSULT NOTE   Pharmacy Consult for Heparin Indication: chest pain/ACS  Allergies  Allergen Reactions  . Pork-Derived Products Swelling    Patient Measurements: Height: 5\' 9"  (175.3 cm) Weight: 240 lb (108.863 kg) (Scale C) IBW/kg (Calculated) : 70.7 Heparin Dosing Weight: 97kg  Vital Signs: Temp: 97.6 F (36.4 C) (05/01 0422) Temp Source: Oral (05/01 0422) BP: 98/72 mmHg (05/01 0422) Pulse Rate: 54 (05/01 0422)  Labs:  Recent Labs  12/19/15 1015 12/19/15 1330 12/19/15 1832 12/20/15 0301 12/20/15 0713 12/20/15 1814 12/21/15 0322 12/22/15 0317  HGB 16.7  --   --   --  15.5  --  15.8 14.8  HCT 49.3  --   --   --  47.5  --  48.1 45.7  PLT 220  --   --   --  156  --  190 192  LABPROT  --   --   --   --   --   --   --  14.7  INR  --   --   --   --   --   --   --  1.13  HEPARINUNFRC  --   --   --   --   --  0.57 0.46 0.42  CREATININE 1.39*  --   --   --  1.16  --   --   --   TROPONINI 0.03 0.92* 1.53* 0.91*  --   --   --   --     Estimated Creatinine Clearance: 83.4 mL/min (by C-G formula based on Cr of 1.16).   Assessment: [age] yom presented to the ED with CP.  Heparin level therapeutic, CBC stable Cath today  Goal of Therapy:  Heparin level 0.3-0.7 units/ml Monitor platelets by anticoagulation protocol: Yes   Plan:  Continue heparin gtt 1200 units/hr Follow up after cath  Thank you Okey RegalLisa Melanye Hiraldo, PharmD [phone] 9:57 AM, 12/22/2015

## 2015-12-22 NOTE — Progress Notes (Signed)
Patient Name: Richard Walker Date of Encounter: 12/22/2015  Principal Problem:   SVT (supraventricular tachycardia) (HCC) Active Problems:   Troponin I above reference range   Primary Cardiologist: Dr Ladona Ridgelaylor Patient Profile: 60 yo male with history of HLD, GERD, SVT and sleep apnea was admitted 04/28 with SVT, hx recently missing BB doses. Troponin elevated>>NSTEMI>>for cath 05/01.  SUBJECTIVE: No chest pain, no palpitations. Has trouble remembering to take BB bid. Generally gets 1 dose in daily.  OBJECTIVE Filed Vitals:   12/21/15 2015 12/22/15 0047 12/22/15 0148 12/22/15 0422  BP: 124/74 122/64  98/72  Pulse: 62 65 68 54  Temp: 98.3 F (36.8 C) 98 F (36.7 C)  97.6 F (36.4 C)  TempSrc: Oral Oral  Oral  Resp: 18 16 18 16   Height:      Weight:    240 lb (108.863 kg)  SpO2: 98% 100% 98% 94%    Intake/Output Summary (Last 24 hours) at 12/22/15 1045 Last data filed at 12/22/15 0926  Gross per 24 hour  Intake  836.2 ml  Output    900 ml  Net  -63.8 ml   Filed Weights   12/20/15 0559 12/21/15 1233 12/22/15 0422  Weight: 242 lb 1 oz (109.8 kg) 240 lb 1.3 oz (108.9 kg) 240 lb (108.863 kg)    PHYSICAL EXAM General: Well developed, well nourished, male in no acute distress. Head: Normocephalic, atraumatic.  Neck: Supple without bruits, JVD not elevated. Lungs:  Resp regular and unlabored, CTA. Heart: RRR, S1, S2, no S3, S4, or murmur; no rub. Abdomen: Soft, non-tender, non-distended, BS + x 4.  Extremities: No clubbing, cyanosis, edema.  Neuro: Alert and oriented X 3. Moves all extremities spontaneously. Psych: Normal affect.  LABS: CBC:  Recent Labs  12/21/15 0322 12/22/15 0317  WBC 7.1 6.8  HGB 15.8 14.8  HCT 48.1 45.7  MCV 87.9 87.0  PLT 190 192   INR:  Recent Labs  12/22/15 0317  INR 1.13   Basic Metabolic Panel:  Recent Labs  96/11/5402/29/17 0713  NA 139  K 4.4  CL 104  CO2 28  GLUCOSE 106*  BUN 8  CREATININE 1.16  CALCIUM 9.1   Cardiac  Enzymes:  Recent Labs  12/19/15 1330 12/19/15 1832 12/20/15 0301  TROPONINI 0.92* 1.53* 0.91*    TELE:   SR, S brady, sustained in the 40s overnight.      Current Medications:  . aspirin EC  81 mg Oral Daily  . loratadine  10 mg Oral Daily  . metoprolol tartrate  25 mg Oral BID  . pantoprazole  40 mg Oral Daily  . sodium chloride flush  3 mL Intravenous Q12H  . Vitamin D (Ergocalciferol)  50,000 Units Oral Once per day on Mon Thu   . sodium chloride 1 mL/kg/hr (12/22/15 0724)  . heparin 1,200 Units/hr (12/20/15 1202)    ASSESSMENT AND PLAN: Principal Problem:   SVT (supraventricular tachycardia) (HCC) - on home dose of BB - HR low overnight, no discernable sx from this - can change to Toprol XL for qd dosing, this may improve compliance - f/u w/ GT  Active Problems:   Troponin I above reference range - with increasing fatigue with exertion and crescendo/decrescendo pattern, cath recommended - scheduled for noon today. - if + CAD, ck lipids and LFTs, add statin  Signed, Theodore DemarkBarrett, Rhonda , PA-C 10:45 AM 12/22/2015 As above, patient seen and examined. No chest pain or dyspnea. Cardiac catheterization revealed no obstructive  coronary disease and normal LV function. No further SVT.Change metoprolol to Toprol 50 mg daily. Follow-up with [name] for further discussion and consideration of ablation. > 30 min PA and physician time D2 Olga Millers

## 2015-12-22 NOTE — H&P (View-Only) (Signed)
Subjective:  No recurrence of supraventricular tachycardia overnight.  Denies angina at the present time.  Renal function has improved since admission.  Objective:  Vital Signs in the last 24 hours: BP 113/74 mmHg  Pulse 58  Temp(Src) 97.9 F (36.6 C) (Oral)  Resp 18  Ht 5\' 9"  (1.753 m)  Wt 109.8 kg (242 lb 1 oz)  BMI 35.73 kg/m2  SpO2 96%  Physical Exam: Pleasant [place] male in NAD Lungs:  Clear Cardiac:  Regular rhythm, normal S1 and S2, no S3 Abdomen:  Soft, nontender, no masses Extremities:  No edema present  Intake/Output from previous day: 04/29 0701 - 04/30 0700 In: 984.6 [P.O.:720; I.V.:264.6] Out: 1925 [Urine:1925]  Weight Filed Weights   12/19/15 1430 12/19/15 1632 12/20/15 0559  Weight: 111.6 kg (246 lb 0.5 oz) 110.179 kg (242 lb 14.4 oz) 109.8 kg (242 lb 1 oz)    Lab Results: Basic Metabolic Panel:  Recent Labs  78/29/5604/28/17 1015 12/20/15 0713  NA 136 139  K 4.3 4.4  CL 106 104  CO2 25 28  GLUCOSE 145* 106*  BUN 18 8  CREATININE 1.39* 1.16   CBC:  Recent Labs  12/20/15 0713 12/21/15 0322  WBC 5.5 7.1  HGB 15.5 15.8  HCT 47.5 48.1  MCV 87.5 87.9  PLT 156 190   Cardiac Panel (last 3 results)  Recent Labs  12/19/15 1330 12/19/15 1832 12/20/15 0301  TROPONINI 0.92* 1.53* 0.91*    Telemetry: Sinus bradycardia - no recurrent SVT   Assessment/Plan:  1. Non STEMI with rise in troponin mildly and subsequent fall. Could be demand ischemia Due to SVT but the presentation following termination was different than before and he has had symptoms suggestive of fatigue and possible angina prior to this.  Recommendations:  Discussed SVT again with patient.  He was somewhat concerned over complications over ablation and may be resistant to this and I did suggest to him a pill in the pocket approach might be helpful but to discuss this with Dr. Ladona Ridgelaylor.  Cardiac catheterization was discussed with the patient fully including risks of myocardial  infarction, death, stroke, bleeding, arrhythmia, dye allergy, renal insufficiency or bleeding.  The patient understands and is willing to proceed.  Possibility of intervention discussed with patient and he is agreeable.      Darden PalmerW. Spencer Tilley, Jr.  MD [facility] Cardiology  12/21/2015, 8:55 AM

## 2015-12-22 NOTE — Discharge Summary (Signed)
Discharge Summary    Patient ID: Richard Walker,  MRN: [id number], DOB/AGE: Aug 02, 1956 60 y.o.  Admit date: 12/19/2015 Discharge date: 12/22/2015  Primary Care Provider: Birdena Walker Primary Cardiologist: Dr Richard Walker  Discharge Diagnoses    Principal Problem:   SVT (supraventricular tachycardia) (HCC) Active Problems:   Troponin I above reference range   Allergies Allergies  Allergen Reactions  . Pork-Derived Products Swelling    Diagnostic Studies/Procedures    CATH: 12/22/2015  Prox LAD to Mid LAD lesion, 20% stenosed.  Ost Cx to Prox Cx lesion, 10% stenosed.  The left ventricular systolic function is normal. 1. Mild nonobstructive CAD 2. Normal LV function Plan: medical management. _____________   History of Present Illness     60 yo male with history of HLD, GERD, SVT and sleep apnea was admitted 04/28 with SVT, hx recently missing BB doses.  Hospital Course     Consultants: None   His troponin was elevated, results below. There was concern for a NSTEMI, because of the crescendo/decrescendo pattern. He had increased DOE, concerning for an anginal equivalent. He was taken to the cath lab on 12/22/2015.  Cardiac cath results are above. There was no significant CAD and his EF was normal. No further ischemic workup is indicated, control of CRFs recommended.  He had Adenonsine in the ER for there SVT. He had no additional episodes of SVT after admission, remaining in SR. His metoprolol was changed to XL formulation for qd dosing and improved compliance.   On 05/01, he was seen by Dr Richard Walker and all data were reviewed. No further inpatient workup was indicated and he is considered stable for discharge, to follow up as an outpatient. Ablation can be considered at that time. _____________  Discharge Vitals Blood pressure 113/72, pulse 60, temperature 97.6 F (36.4 C), temperature source Oral, resp. rate 18, height  (1.753 m), weight 240 lb (108.863 kg),  SpO2 98 %.  Filed Weights   12/20/15 0559 12/21/15 1233 12/22/15 0422  Weight: 242 lb 1 oz (109.8 kg) 240 lb 1.3 oz (108.9 kg) 240 lb (108.863 kg)    Labs & Radiologic Studies    CBC  Recent Labs  12/21/15 0322 12/22/15 0317  WBC 7.1 6.8  HGB 15.8 14.8  HCT 48.1 45.7  MCV 87.9 87.0  PLT 190 192   Basic Metabolic Panel  Recent Labs  12/20/15 0713  NA 139  K 4.4  CL 104  CO2 28  GLUCOSE 106*  BUN 8  CREATININE 1.16  CALCIUM 9.1   Cardiac Enzymes  Recent Labs  12/19/15 1330 12/19/15 1832 12/20/15 0301  TROPONINI 0.92* 1.53* 0.91*   _____________  Dg Chest Port 1 View  12/19/2015  CLINICAL DATA:  Acute onset centralized chest pain radiating to right shoulder. Shortness of breath, diaphoresis EXAM: PORTABLE CHEST 1 VIEW COMPARISON:  09/18/2015 FINDINGS: Heart is borderline in size. Mild vascular congestion and interstitial prominence. No confluent opacities or effusions. No acute bony abnormality. IMPRESSION: Borderline heart size with mild vascular congestion and increased interstitial prominence. Cannot exclude interstitial edema. Electronically Signed   By: Richard Walker M.D.   On: 12/19/2015 10:58   Disposition   Pt is being discharged home today in good condition.  Follow-up Plans & Appointments    Follow-up Information    Follow up with Richard Bunting, MD On 01/13/2016.   Specialty:  Cardiology   Why:  See Dr Richard Walker at 9:00 am, please arrive 15 minutes early for paperwork  Contact information:   [address] [place] [address] [phone]      Discharge Instructions    Diet - low sodium heart healthy    Complete by:  As directed      Increase activity slowly    Complete by:  As directed            Discharge Medications   Current Discharge Medication List    START taking these medications   Details  metoprolol succinate (TOPROL-XL) 50 MG 24 hr tablet Take 1 tablet (50 mg total) by mouth daily. Take with or immediately  following a meal. Qty: 30 tablet, Refills: 11      CONTINUE these medications which have NOT CHANGED   Details  acetaminophen (TYLENOL) 500 MG tablet Take 1,000-1,500 mg by mouth 2 (two) times daily as needed (pain).    aspirin EC 81 MG tablet Take 81 mg by mouth 2 (two) times daily.    esomeprazole (NEXIUM) 40 MG capsule Take 40 mg by mouth 2 (two) times daily as needed (acid reflux).  Refills: 11    ibuprofen (ADVIL,MOTRIN) 200 MG tablet Take 400-600 mg by mouth every 6 (six) hours as needed (pain).     loratadine (CLARITIN) 10 MG tablet Take 10 mg by mouth daily as needed for allergies.     Multiple Vitamin (MULTIVITAMIN WITH MINERALS) TABS tablet Take 1 tablet by mouth daily. Centrum    PRESCRIPTION MEDICATION Inhale into the lungs at bedtime. CPAP    Vitamin D, Ergocalciferol, (DRISDOL) 50000 UNITS CAPS capsule Take 50,000 Units by mouth 2 (two) times a week. Mondays and Thursdays      STOP taking these medications     metoprolol tartrate (LOPRESSOR) 25 MG tablet      aspirin 81 MG tablet          Outstanding Labs/Studies   none  Duration of Discharge Encounter   Greater than 30 minutes including physician time.  Richard QuitterSigned, Barrett, Rhonda NP 12/22/2015, 1:17 PM    See previous progress notes Richard Walker

## 2015-12-22 NOTE — Progress Notes (Signed)
1730 discharge instruction given to pt and spouse .Verbalizedd understanding . Post  Radial  TR band no sign of bleeding Gauze intact Wheeled to l;obby by NT

## 2015-12-24 ENCOUNTER — Other Ambulatory Visit: Payer: Self-pay

## 2015-12-24 ENCOUNTER — Telehealth: Payer: Self-pay | Admitting: Family Medicine

## 2015-12-24 ENCOUNTER — Encounter: Payer: Self-pay | Admitting: Family Medicine

## 2015-12-24 ENCOUNTER — Ambulatory Visit (INDEPENDENT_AMBULATORY_CARE_PROVIDER_SITE_OTHER): Payer: BLUE CROSS/BLUE SHIELD | Admitting: Family Medicine

## 2015-12-24 VITALS — BP 122/90 | HR 64 | Temp 98.1°F | Ht 68.75 in | Wt 244.0 lb

## 2015-12-24 DIAGNOSIS — R7401 Elevation of levels of liver transaminase levels: Secondary | ICD-10-CM

## 2015-12-24 DIAGNOSIS — E785 Hyperlipidemia, unspecified: Secondary | ICD-10-CM | POA: Diagnosis not present

## 2015-12-24 DIAGNOSIS — Z131 Encounter for screening for diabetes mellitus: Secondary | ICD-10-CM | POA: Diagnosis not present

## 2015-12-24 DIAGNOSIS — B181 Chronic viral hepatitis B without delta-agent: Secondary | ICD-10-CM

## 2015-12-24 DIAGNOSIS — J0110 Acute frontal sinusitis, unspecified: Secondary | ICD-10-CM

## 2015-12-24 DIAGNOSIS — R740 Nonspecific elevation of levels of transaminase and lactic acid dehydrogenase [LDH]: Secondary | ICD-10-CM

## 2015-12-24 DIAGNOSIS — E559 Vitamin D deficiency, unspecified: Secondary | ICD-10-CM | POA: Diagnosis not present

## 2015-12-24 DIAGNOSIS — I471 Supraventricular tachycardia: Secondary | ICD-10-CM | POA: Diagnosis not present

## 2015-12-24 LAB — COMPREHENSIVE METABOLIC PANEL
ALT: 96 U/L — ABNORMAL HIGH (ref 0–53)
AST: 78 U/L — AB (ref 0–37)
Albumin: 4.3 g/dL (ref 3.5–5.2)
Alkaline Phosphatase: 81 U/L (ref 39–117)
BUN: 15 mg/dL (ref 6–23)
CALCIUM: 9.7 mg/dL (ref 8.4–10.5)
CHLORIDE: 100 meq/L (ref 96–112)
CO2: 29 meq/L (ref 19–32)
Creatinine, Ser: 1.16 mg/dL (ref 0.40–1.50)
GFR: 68.37 mL/min (ref >60.00)
Glucose, Bld: 105 mg/dL — ABNORMAL HIGH (ref 70–99)
POTASSIUM: 4.3 meq/L (ref 3.5–5.1)
Sodium: 136 mEq/L (ref 135–145)
Total Bilirubin: 0.6 mg/dL (ref 0.2–1.2)
Total Protein: 7.5 g/dL (ref 6.0–8.3)

## 2015-12-24 LAB — LDL CHOLESTEROL, DIRECT: Direct LDL: 145 mg/dL

## 2015-12-24 LAB — LIPID PANEL
CHOL/HDL RATIO: 6
CHOLESTEROL: 222 mg/dL — AB (ref 0–200)
HDL: 36.8 mg/dL — AB (ref >39.00)
NonHDL: 185.15
TRIGLYCERIDES: 207 mg/dL — AB (ref 0.0–149.0)
VLDL: 41.4 mg/dL — AB (ref 0.0–40.0)

## 2015-12-24 LAB — VITAMIN D 25 HYDROXY (VIT D DEFICIENCY, FRACTURES): VITD: 29.12 ng/mL — ABNORMAL LOW (ref 30.00–100.00)

## 2015-12-24 LAB — HEMOGLOBIN A1C: HEMOGLOBIN A1C: 5.7 % (ref 4.6–6.5)

## 2015-12-24 MED ORDER — VITAMIN D (ERGOCALCIFEROL) 1.25 MG (50000 UNIT) PO CAPS: 50000.0000 [IU] | ORAL_CAPSULE | ORAL | Status: DC

## 2015-12-24 MED ORDER — ESOMEPRAZOLE MAGNESIUM 40 MG PO CPDR: 40.0000 mg | DELAYED_RELEASE_CAPSULE | Freq: Two times a day (BID) | ORAL | Status: DC | PRN

## 2015-12-24 MED ORDER — AMOXICILLIN 500 MG PO CAPS: 1000.0000 mg | ORAL_CAPSULE | Freq: Two times a day (BID) | ORAL | Status: DC

## 2015-12-24 NOTE — Telephone Encounter (Signed)
Medication filled per patient request. 

## 2015-12-24 NOTE — Progress Notes (Signed)
Pre visit review using our clinic tool,if applicable. No additional management support is needed unless otherwise documented below in the visit note.  

## 2015-12-24 NOTE — Patient Instructions (Addendum)
We are going to treat you for a possible sinus infection with amoxicillin 1,000 mg twice a day for 10 days I will be in touch with the rest of your labs asap  Let me know if your sinus symptoms/ ear pressure/headache are not better soon!

## 2015-12-24 NOTE — Progress Notes (Signed)
[facility] [address], [address] [place], [place] [address] [phone]  Date:  12/24/2015   Name:  Richard Walker   DOB:  1956-04-26   MRN:  [id number]  PCP:  Abbe Amsterdam, MD    Chief Complaint: Establish Care   History of Present Illness:  Richard Walker is a 60 y.o. very pleasant male patient who presents with the following:  He was in the hospital recently wiht SVT-inpt from 4/28 to 5/1.  He is feeling "much better" now He had an apparent non-STEMI. Cardiac cath:  Prox LAD to Mid LAD lesion, 20% stenosed.  Ost Cx to Prox Cx lesion, 10% stenosed.  The left ventricular systolic function is normal.  1. Mild nonobstructive CAD 2. Normal LV function  Plan: medical management.  He went to the ER because of his recurrent SVT.   He has had this a couple of other times in the past and it resolved with medication in the ER but this time his troponin was also elevated so he was admitted  He notes that he has "pressure all over my head, my right ear is swollen."   He notes sinus pressure and congestion- present for the last week or so He has been told that he carried Hep B in the past.  Not treated but would have periodic labs for monitoring.  Would like to do this today He is not fasting  He has sleep apnea- they plan a new sleep study for him next week.   He has noted a cough, mucus in his chest.  He notes the cough yesterday, and it seems to be worse today.  Also a runny nose and mucus from his nose and throat.  He had a ST this am, felt dry. He thinks that his CPAP mask might make his dry throat worse He has not noted any fever at home.  However he is using tylenol a few times a day. None today He has had headaches for a week or so now which we think may be due to sinus pressure   He also has a history of hyperlipidemia- this was last checked several months ago  History of low vitamin D, he takes 50K twice a  week Patient Active Problem List   Diagnosis Date Noted  . Troponin I above reference range 12/19/2015  . SVT (supraventricular tachycardia) (HCC) 04/23/2015  . Abdominal pain, epigastric 06/03/2013  . Nausea alone 06/03/2013  . Diarrhea 06/03/2013  . Unspecified vitamin D deficiency 04/09/2013  . Esophageal reflux 04/09/2013  . Hepatitis B carrier 04/09/2013  . Other and unspecified hyperlipidemia 04/09/2013  . Unspecified sleep apnea 04/09/2013    Past Medical History  Diagnosis Date  . Hyperlipidemia   . GERD (gastroesophageal reflux disease)   . DDD (degenerative disc disease), lumbar     L3/L4  . Diverticulosis   . Low sperm motility   . SVT (supraventricular tachycardia) (HCC)   . Sleep apnea     uses cpap     Past Surgical History  Procedure Laterality Date  . Back surgery    . Wisdom tooth extraction    . Cardiac catheterization N/A 12/22/2015    Procedure: Left Heart Cath and Coronary Angiography;  Surgeon: Peter M Swaziland, MD;  Location: [facility] INVASIVE CV LAB;  Service: Cardiovascular;  Laterality: N/A;    Social History  Substance Use Topics  . Smoking status: Former Games developer  . Smokeless tobacco:  Never Used     Comment: Uses [employer] occasionally  . Alcohol Use: No    Family History  Problem Relation Age of Onset  . Hypertension Father     Allergies  Allergen Reactions  . Pork-Derived Products Swelling    Medication list has been reviewed and updated.  Current Outpatient Prescriptions on File Prior to Visit  Medication Sig Dispense Refill  . acetaminophen (TYLENOL) 500 MG tablet Take 1,000-1,500 mg by mouth 2 (two) times daily as needed (pain).    [name] aspirin EC 81 MG tablet Take 81 mg by mouth 2 (two) times daily.    [name] esomeprazole (NEXIUM) 40 MG capsule Take 40 mg by mouth 2 (two) times daily as needed (acid reflux).   11  . ibuprofen (ADVIL,MOTRIN) 200 MG tablet Take 400-600 mg by mouth every 6 (six) hours as needed (pain).     [name] loratadine  (CLARITIN) 10 MG tablet Take 10 mg by mouth daily as needed for allergies.     . metoprolol succinate (TOPROL-XL) 50 MG 24 hr tablet Take 1 tablet (50 mg total) by mouth daily. Take with or immediately following a meal. 30 tablet 11  . Multiple Vitamin (MULTIVITAMIN WITH MINERALS) TABS tablet Take 1 tablet by mouth daily. Centrum    . PRESCRIPTION MEDICATION Inhale into the lungs at bedtime. CPAP    . Vitamin D, Ergocalciferol, (DRISDOL) 50000 UNITS CAPS capsule Take 50,000 Units by mouth 2 (two) times a week. Mondays and Thursdays     No current facility-administered medications on file prior to visit.    Review of Systems:  As per HPI- otherwise negative.   Physical Examination: Filed Vitals:   12/24/15 1024  BP: 122/90  Pulse: 64  Temp: 98.1 F (36.7 C)   Filed Vitals:   12/24/15 1024  Height: 5' 8.75" (1.746 m)  Weight: 244 lb (110.678 kg)   Body mass index is 36.31 kg/(m^2). Ideal Body Weight: Weight in (lb) to have BMI = 25: 167.7  GEN: WDWN, NAD, Non-toxic, A & O x 3, obese, looks well HEENT: Atraumatic, Normocephalic. Neck supple. No masses, No LAD.  Bilateral TM wnl, oropharynx normal.  PEERL,EOMI.  Nasal cavity is congested  Ears and Nose: No external deformity. CV: RRR, No M/G/R. No JVD. No thrill. No extra heart sounds. PULM: CTA B, no wheezes, crackles, rhonchi. No retractions. No resp. distress. No accessory muscle use. EXTR: No c/c/e NEURO Normal gait.  PSYCH: Normally interactive. Conversant. Not depressed or anxious appearing.  Calm demeanor.    Assessment and Plan: Acute frontal sinusitis, recurrence not specified - Plan: amoxicillin (AMOXIL) 500 MG capsule  Hyperlipidemia - Plan: Comprehensive metabolic panel, Lipid panel  Hepatitis B carrier - Plan: Comprehensive metabolic panel, Hepatitis B DNA, Ultraquantitative, PCR, Hepatitis B E Antigen  SVT (supraventricular tachycardia) (HCC)  Screening for diabetes mellitus - Plan: Hemoglobin A1c  Vitamin  D deficiency - Plan: Vitamin D (25 hydroxy)  He has noted headaches, right ear congestion and sinus pressure for a week or so.  Will treat with amoxicillin for 10 days Labs to check his HepB status and also lipids, A1c, vitamin D His SVT is currently controlled, he is on a BB now  We are going to treat you for a possible sinus infection with amoxicillin 1,000 mg twice a day for 10 days I will be in touch with the rest of your labs asap  Let me know if your sinus symptoms/ ear pressure/headache are not better soo Signed Shanda Bumps  Larene Ascencio, MD

## 2015-12-24 NOTE — Telephone Encounter (Signed)
Pt says that he need a refill on his vitamin D and also Nexium.    [facility] [address]    CB: [phone]

## 2015-12-26 LAB — HEPATITIS B DNA, ULTRAQUANTITATIVE, PCR: Hepatitis B DNA: <20 IU/mL (ref <20)

## 2015-12-26 LAB — HEPATITIS B E ANTIGEN: HEPATITIS BE ANTIGEN: NONREACTIVE

## 2015-12-28 NOTE — Addendum Note (Signed)
Addended by: Abbe AmsterdamOPLAND, Renee Erb C on: 12/28/2015 08:35 PM   Modules accepted: Orders

## 2015-12-29 ENCOUNTER — Ambulatory Visit (INDEPENDENT_AMBULATORY_CARE_PROVIDER_SITE_OTHER): Payer: BLUE CROSS/BLUE SHIELD | Admitting: Neurology

## 2015-12-29 DIAGNOSIS — R5383 Other fatigue: Secondary | ICD-10-CM

## 2015-12-29 DIAGNOSIS — E669 Obesity, unspecified: Secondary | ICD-10-CM

## 2015-12-29 DIAGNOSIS — G4733 Obstructive sleep apnea (adult) (pediatric): Secondary | ICD-10-CM

## 2015-12-29 DIAGNOSIS — J0130 Acute sphenoidal sinusitis, unspecified: Secondary | ICD-10-CM

## 2015-12-29 DIAGNOSIS — H65491 Other chronic nonsuppurative otitis media, right ear: Secondary | ICD-10-CM

## 2015-12-29 DIAGNOSIS — J0120 Acute ethmoidal sinusitis, unspecified: Secondary | ICD-10-CM

## 2015-12-30 ENCOUNTER — Telehealth: Payer: Self-pay

## 2015-12-30 ENCOUNTER — Telehealth: Payer: Self-pay | Admitting: *Deleted

## 2015-12-30 DIAGNOSIS — G4733 Obstructive sleep apnea (adult) (pediatric): Secondary | ICD-10-CM

## 2015-12-30 NOTE — Telephone Encounter (Signed)
Spoke to Richard Walker and advised him that his sleep study results showed complex, mostly obstructive sleep apnea. Dr. Vickey Hugerohmeier recommends starting a new auto cpap. Richard Walker is agreeable. Richard Walker is asking for a FFM and nasal pillows. He has been using [facility] but is not happy with their service. Will send to [facility]. I advised Richard Walker that there were frequent PLMS of sleep noted before CPAP initiation, with association sleep disruption. I advised Richard Walker to sleep on his side. I advised Richard Walker to lose weight, diet, and exercise if not contraindicated by his other physicians. Richard Walker verbalized understanding. Richard Walker wants a copy of his sleep study results. I advised him that he must fill out a MR release before he can get these records. Richard Walker verbalized understanding.

## 2015-12-30 NOTE — Telephone Encounter (Deleted)
Pt returning your call

## 2015-12-30 NOTE — Telephone Encounter (Signed)
I called pt to discuss sleep study results. No answer, left a message asking her to call me back. 

## 2016-01-05 ENCOUNTER — Telehealth: Payer: Self-pay

## 2016-01-05 NOTE — Telephone Encounter (Signed)
Pt came by the office today and informed me that he hasn't heard from [facility] yet. I advised him that it usually takes a week, and [facility] will need to run his insurance first. I sent the referral on 12/30/15. I will reach out to [facility] and check on the status and also as [facility] to reach out the pt as welll. Pt verbalized understanding.

## 2016-01-07 NOTE — Telephone Encounter (Signed)
Dr. Patsy Lageropland could you review patients labs and send back to me and I will call patient with results and any changes you may want made.

## 2016-01-08 ENCOUNTER — Telehealth: Payer: Self-pay | Admitting: Family Medicine

## 2016-01-08 NOTE — Telephone Encounter (Signed)
Can be reached: [phone]   Reason for call: Pt states he talked to nurse yesterday and was told that lab results have not been reviewed by Dr. Patsy Lageropland but they would check and call him back yesterday. Pt calling again to get results from 12/24/15.

## 2016-01-08 NOTE — Telephone Encounter (Signed)
Dr. Patsy Lageropland called patient this am.

## 2016-01-08 NOTE — Telephone Encounter (Signed)
Received his message- called him back. I am so sorry, somehow his labs left my inbox without my sending him notes although I remember writing him a mychart message about his labs. 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 His LFTS are a bit elevated, cholesterol needs improvement and his A1c shows pre-diabetes. Weight loss is key. He does not wish to take cholesterol medication or metformin at this time, but will work on weight loss and exercise.  He will see me in 3-4 months for a recheck.  Will send him a letter with his labs asap  Results for orders placed or performed in visit on 12/24/15  Comprehensive metabolic panel  Result Value Ref Range   Sodium 136 135 - 145 mEq/L   Potassium 4.3 3.5 - 5.1 mEq/L   Chloride 100 96 - 112 mEq/L   CO2 29 19 - 32 mEq/L   Glucose, Bld 105 (H) 70 - 99 mg/dL   BUN 15 6 - 23 mg/dL   Creatinine, Ser 5.401.16 0.40 - 1.50 mg/dL   Total Bilirubin 0.6 0.2 - 1.2 mg/dL   Alkaline Phosphatase 81 39 - 117 U/L   AST 78 (H) 0 - 37 U/L   ALT 96 (H) 0 - 53 U/L   Total Protein 7.5 6.0 - 8.3 g/dL   Albumin 4.3 3.5 - 5.2 g/dL   Calcium 9.7 8.4 - 98.110.5 mg/dL   GFR 19.1468.37 >78.29>60.00 mL/min  Hemoglobin A1c  Result Value Ref Range   Hgb A1c MFr Bld 5.7 4.6 - 6.5 %  Hepatitis B DNA, Ultraquantitative, PCR  Result Value Ref Range   Hepatitis B DNA <20 <20 IU/mL   Hepatitis B DNA (Calc) <1.30 <1.30 Log IU/mL  Lipid panel  Result Value Ref Range   Cholesterol 222 (H) 0 - 200 mg/dL   Triglycerides 562.1207.0 (H) 0.0 - 149.0 mg/dL   HDL 30.8636.80 (L) >57.84>39.00 mg/dL   VLDL 69.641.4 (H) 0.0 - 29.540.0 mg/dL   Total CHOL/HDL Ratio 6    NonHDL 185.15   Hepatitis B E Antigen  Result Value Ref Range   Hepatitis Be Antigen NON-REACTIVE NON-REACTIVE  Vitamin D (25 hydroxy)  Result Value Ref Range   VITD 29.12 (L) 30.00 - 100.00 ng/mL  LDL cholesterol, direct  Result Value Ref Range   Direct LDL 145.0 mg/dL

## 2016-01-13 ENCOUNTER — Ambulatory Visit (INDEPENDENT_AMBULATORY_CARE_PROVIDER_SITE_OTHER): Payer: BLUE CROSS/BLUE SHIELD | Admitting: Internal Medicine

## 2016-01-13 ENCOUNTER — Encounter: Payer: Self-pay | Admitting: Internal Medicine

## 2016-01-13 VITALS — BP 142/86 | HR 70 | Ht 68.75 in | Wt 247.6 lb

## 2016-01-13 DIAGNOSIS — I471 Supraventricular tachycardia: Secondary | ICD-10-CM

## 2016-01-13 NOTE — Progress Notes (Signed)
HPI [name] returns today for ongoing evaluation of SVT. He is a pleasant 60 yo man who has otherwise been healthy. He was in the hospital earlier this month with SVT and treated with IV adenosine. He is interested in pursuing catheter ablation. He had tried to terminate his episodes with valsalve maneuvers and was ineffective. The had terminated with IV adenosine. Allergies  Allergen Reactions  . Pork-Derived Products Swelling  . Statins     Muscle aches     Current Outpatient Prescriptions  Medication Sig Dispense Refill  . acetaminophen (TYLENOL) 500 MG tablet Take 1,000-1,500 mg by mouth 2 (two) times daily as needed (pain).    [name]. aspirin EC 81 MG tablet Take 81 mg by mouth 2 (two) times daily.    [name]. esomeprazole (NEXIUM) 40 MG capsule Take 1 capsule (40 mg total) by mouth 2 (two) times daily as needed (acid reflux). 60 capsule 11  . ibuprofen (ADVIL,MOTRIN) 200 MG tablet Take 400-600 mg by mouth every 6 (six) hours as needed (pain).     [name]. loratadine (CLARITIN) 10 MG tablet Take 10 mg by mouth daily as needed for allergies.     . metoprolol succinate (TOPROL-XL) 50 MG 24 hr tablet Take 1 tablet (50 mg total) by mouth daily. Take with or immediately following a meal. 30 tablet 11  . Multiple Vitamin (MULTIVITAMIN WITH MINERALS) TABS tablet Take 1 tablet by mouth daily. Centrum    . PRESCRIPTION MEDICATION Inhale into the lungs at bedtime. CPAP    . Vitamin D, Ergocalciferol, (DRISDOL) 50000 units CAPS capsule Take 1 capsule (50,000 Units total) by mouth 2 (two) times a week. Mondays and Thursdays 30 capsule 6   No current facility-administered medications for this visit.     Past Medical History  Diagnosis Date  . Hyperlipidemia   . GERD (gastroesophageal reflux disease)   . DDD (degenerative disc disease), lumbar     L3/L4  . Diverticulosis   . Low sperm motility   . SVT (supraventricular tachycardia) (HCC)   . Sleep apnea     uses cpap     ROS:   All systems  reviewed and negative except as noted in the HPI.   Past Surgical History  Procedure Laterality Date  . Back surgery    . Wisdom tooth extraction    . Cardiac catheterization N/A 12/22/2015    Procedure: Left Heart Cath and Coronary Angiography;  Surgeon: Peter M SwazilandJordan, MD;  [facility];  Service: Cardiovascular;  Laterality: N/A;     Family History  Problem Relation Age of Onset  . Hypertension Father      Social History   Social History  . Marital Status: Married    Spouse Name: N/A  . Number of Children: N/A  . Years of Education: N/A   Occupational History  . Not on file.   Social History Main Topics  . Smoking status: Former Games developermoker  . Smokeless tobacco: Never Used     Comment: Uses Hookah Pipe occasionally  . Alcohol Use: No  . Drug Use: No  . Sexual Activity: Yes   Other Topics Concern  . 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     Alcohol Use:  None   Drug Use:  None   Diet:  Regular   Exercise:  None   Hobbies:  Cards     BP 142/86 mmHg  Pulse 70  Ht 5' 8.75" (1.746 m)  Wt 247 lb 9.6 oz (112.311 kg)  BMI 36.84 kg/m2  SpO2 98%  Physical Exam:  Well appearing 60 yo man, NAD HEENT: Unremarkable Neck:  7 cm JVD, no thyromegally Lymphatics:  No adenopathy Back:  No CVA tenderness Lungs:  Clear with no wheezes HEART:  Regular rate rhythm, no murmurs, no rubs, no clicks Abd:  soft, positive bowel sounds, no organomegally, no rebound, no guarding Ext:  2 plus pulses, no edema, no cyanosis, no clubbing Skin:  No rashes no nodules Neuro:  CN II through XII intact, motor grossly  intact  EKG - nsr with normal axis and intervals.   Assess/Plan: 1. SVT - he continues to have symptoms. I have offered him ablation and he is interested in proceeding. 2. Dyslipidemia - he will continue a low fat diet.   Leonia Reeves.D.

## 2016-01-13 NOTE — Patient Instructions (Addendum)
Medication Instructions:  Your physician recommends that you continue on your current medications as directed. Please refer to the Current Medication list given to you today.   Labwork: Your physician recommends that you return for lab work on 02/12/16 at 4:00pm   Testing/Procedures: Your physician has recommended that you have an ablation. Catheter ablation is a medical procedure used to treat some cardiac arrhythmias (irregular heartbeats). During catheter ablation, a long, thin, flexible tube is put into a blood vessel in your groin (upper thigh), or neck. This tube is called an ablation catheter. It is then guided to your heart through the blood vessel. Radio frequency waves destroy small areas of heart tissue where abnormal heartbeats may cause an arrhythmia to start. Please see the instruction sheet given to you today.  [facility] on 02/19/16 at 5:30am Do not eat or drink after midnight the night prior to your procedure Do not take any medications the morning of your procedure Hold your Metoprolol 48 hours prior to your ablation---last dose 02/16/16 You will spend one night in the hospital   Follow-Up: Your physician recommends that you schedule a follow-up appointment in: 4 weeks for 02/19/16 with Dr Ladona Ridgelaylor   Any Other Special Instructions Will Be Listed Below (If Applicable).     If you need a refill on your cardiac medications before your next appointment, please call your pharmacy.

## 2016-01-15 ENCOUNTER — Telehealth: Payer: Self-pay | Admitting: Internal Medicine

## 2016-01-15 NOTE — Telephone Encounter (Signed)
Pt's wife calling re taking Metoprolol in the am, without eating from sunrise until sunset- 16hrs- due to Ramadan -wife calling to make sure this is not a problem to his health- ok to leave message

## 2016-01-15 NOTE — Telephone Encounter (Signed)
Pt has been fasting for Ramadan and wife wanted to know when is best time for him to take his medication, as he has been taking it in the evening. Yesterday pt started to have palpitations and "didn't feel like himself" but it passed. Pt educated that best to take medication same time each day, and pt needs to do what he feels most comfortable with. Pt wife asked, when should they call if his HR is high.  Educated that if HR consistently greater than 120 then they can call our office and can be address at that time. In meantime, want to keep him comfortable until his scheduled procedure on 02/19/16. Verbalized understanding, no questions at this time.

## 2016-01-20 ENCOUNTER — Telehealth: Payer: Self-pay | Admitting: Internal Medicine

## 2016-01-20 NOTE — Telephone Encounter (Signed)
Patient c/o Palpitations:  High priority if patient c/o lightheadedness and shortness of breath.  1. How long have you been having palpitations? 01-19-2016  2. Are you currently experiencing lightheadedness and shortness of breath? NO  3. Have you checked your BP and heart rate? (document readings) NO  4. Are you experiencing any other symptoms? WHEN PT EAT after 5 min his heart feels uncomfortable, or if he lays down  Wife wants pt to wear a monitor

## 2016-01-20 NOTE — Telephone Encounter (Signed)
Spoke with patient's wife and let her know he needs to move his procedure up if still having issues.  He wanted to wait until the end of June so he could get back to work some before going out again.  She is going to talk to him when he gets home and call me back

## 2016-01-24 DIAGNOSIS — Z9889 Other specified postprocedural states: Secondary | ICD-10-CM | POA: Insufficient documentation

## 2016-01-30 ENCOUNTER — Telehealth: Payer: Self-pay | Admitting: Internal Medicine

## 2016-01-30 ENCOUNTER — Emergency Department (HOSPITAL_BASED_OUTPATIENT_CLINIC_OR_DEPARTMENT_OTHER): Payer: BLUE CROSS/BLUE SHIELD

## 2016-01-30 ENCOUNTER — Encounter (HOSPITAL_BASED_OUTPATIENT_CLINIC_OR_DEPARTMENT_OTHER): Payer: Self-pay | Admitting: Emergency Medicine

## 2016-01-30 ENCOUNTER — Emergency Department (HOSPITAL_BASED_OUTPATIENT_CLINIC_OR_DEPARTMENT_OTHER)
Admission: EM | Admit: 2016-01-30 | Discharge: 2016-01-30 | Disposition: A | Discharge status: Home / Self Care | Payer: BLUE CROSS/BLUE SHIELD | Attending: Emergency Medicine | Admitting: Emergency Medicine

## 2016-01-30 ENCOUNTER — Other Ambulatory Visit: Payer: Self-pay

## 2016-01-30 DIAGNOSIS — Z7982 Long term (current) use of aspirin: Secondary | ICD-10-CM | POA: Insufficient documentation

## 2016-01-30 DIAGNOSIS — I471 Supraventricular tachycardia: Secondary | ICD-10-CM | POA: Diagnosis not present

## 2016-01-30 DIAGNOSIS — Z87891 Personal history of nicotine dependence: Secondary | ICD-10-CM | POA: Diagnosis not present

## 2016-01-30 DIAGNOSIS — E785 Hyperlipidemia, unspecified: Secondary | ICD-10-CM | POA: Insufficient documentation

## 2016-01-30 DIAGNOSIS — R079 Chest pain, unspecified: Secondary | ICD-10-CM | POA: Diagnosis present

## 2016-01-30 LAB — CBC WITH DIFFERENTIAL/PLATELET
BASOS ABS: 0 10*3/uL (ref 0.0–0.1)
BASOS PCT: 1 %
EOS PCT: 1 %
Eosinophils Absolute: 0.1 10*3/uL (ref 0.0–0.7)
HCT: 49.2 % (ref 39.0–52.0)
Hemoglobin: 16.8 g/dL (ref 13.0–17.0)
Lymphocytes Relative: 41 %
Lymphs Abs: 2.9 10*3/uL (ref 0.7–4.0)
MCH: 28.9 pg (ref 26.0–34.0)
MCHC: 34.1 g/dL (ref 30.0–36.0)
MCV: 84.7 fL (ref 78.0–100.0)
MONO ABS: 0.5 10*3/uL (ref 0.1–1.0)
Monocytes Relative: 7 %
Neutro Abs: 3.6 10*3/uL (ref 1.7–7.7)
Neutrophils Relative %: 50 %
PLATELETS: 215 10*3/uL (ref 150–400)
RBC: 5.81 MIL/uL (ref 4.22–5.81)
RDW: 13.9 % (ref 11.5–15.5)
WBC: 7.2 10*3/uL (ref 4.0–10.5)

## 2016-01-30 LAB — COMPREHENSIVE METABOLIC PANEL
ALBUMIN: 4.4 g/dL (ref 3.5–5.0)
ALT: 22 U/L (ref 17–63)
ANION GAP: 12 (ref 5–15)
AST: 18 U/L (ref 15–41)
Alkaline Phosphatase: 86 U/L (ref 38–126)
BUN: 18 mg/dL (ref 6–20)
CHLORIDE: 99 mmol/L — AB (ref 101–111)
CO2: 25 mmol/L (ref 22–32)
Calcium: 9.6 mg/dL (ref 8.9–10.3)
Creatinine, Ser: 1.25 mg/dL — ABNORMAL HIGH (ref 0.61–1.24)
GFR calc Af Amer: >60 mL/min (ref >60)
Glucose, Bld: 144 mg/dL — ABNORMAL HIGH (ref 65–99)
POTASSIUM: 3.6 mmol/L (ref 3.5–5.1)
Sodium: 136 mmol/L (ref 135–145)
Total Bilirubin: 0.7 mg/dL (ref 0.3–1.2)
Total Protein: 7.7 g/dL (ref 6.5–8.1)

## 2016-01-30 LAB — TROPONIN I

## 2016-01-30 MED ORDER — ADENOSINE 6 MG/2ML IV SOLN
6.0000 mg | Freq: Once | INTRAVENOUS | Status: AC
  Administered 2016-01-30: 6 mg via INTRAVENOUS

## 2016-01-30 MED ORDER — ADENOSINE 6 MG/2ML IV SOLN
INTRAVENOUS | Status: AC
  Administered 2016-01-30: 12 mg
  Filled 2016-01-30: qty 6

## 2016-01-30 MED ORDER — METOPROLOL TARTRATE 5 MG/5ML IV SOLN
INTRAVENOUS | Status: AC
  Administered 2016-01-30: 5 mg
  Filled 2016-01-30: qty 20

## 2016-01-30 MED ORDER — METOPROLOL SUCCINATE ER 25 MG PO TB24: 75.0000 mg | ORAL_TABLET | Freq: Every day | ORAL | Status: DC

## 2016-01-30 MED FILL — METOPROLOL SUCC ER 25 MG TA: 25 | 30 days supply | Qty: 90 | Fill #0

## 2016-01-30 NOTE — Telephone Encounter (Signed)
Called pt back he was sleeping; called pt wife cell phone [phone] and left message.   Pt scheduduled for ablation on 6/29 with G. Ladona Ridgelaylor. Seen in ED by Randolpf and pt HR decreased to 73

## 2016-01-30 NOTE — Telephone Encounter (Signed)
F/u Message   Pt wife returning RN.

## 2016-01-30 NOTE — ED Notes (Signed)
No conversion after administration 6mg  of adenosine.  Per MD-12mg  of adenosine ordered.

## 2016-01-30 NOTE — Telephone Encounter (Signed)
Spoke with pt wife as she stated pt at work and having some chest discomfort, sweating, and having SOB that has been ongoing since 7am. Pt unable to check BP but his HR is up and down from 105 to 70 and she is worried, as he having a hard time talking to her. Told wife if he is having all of these difficulties he needs to go to the ER to be evaluated. She asked that I speak with pt and call him at work. Verified pt phone number, spoke with pt he is leaving work at this moment driving home. Pt told he does not need to drive and to call [phone] to be evaluated in ER. Pt stated his HR has been as high at 208 on his HR watch. Pt stated he was going to drive home and then go get evaluated.

## 2016-01-30 NOTE — ED Provider Notes (Signed)
CSN: [id number]     Arrival date & time 01/30/16  [id number] History   First MD Initiated Contact with Patient 01/30/16 [phone]     Chief Complaint  Patient presents with  . Chest Pain     (Consider location/radiation/quality/duration/timing/severity/associated sxs/prior Treatment) HPI  A LEVEL 5 CAVEAT PERTAINS DUE TO URGENT NEED FOR INTERVENTION.  Pt with a hx of SVT began to feel symptoms this morning at 6am.  He feels rapid heart rate and chest pain.  No fainting.  He does feel short of breath.  States this is similar to prior episodes of SVT.  He is scheduled for an ablation 6/29.  He takes metoprolol and states he has not missed doses.    Past Medical History  Diagnosis Date  . Hyperlipidemia   . GERD (gastroesophageal reflux disease)   . DDD (degenerative disc disease), lumbar     L3/L4  . Diverticulosis   . Low sperm motility   . SVT (supraventricular tachycardia) (HCC)   . Sleep apnea     uses cpap    Past Surgical History  Procedure Laterality Date  . Back surgery    . Wisdom tooth extraction    . Cardiac catheterization N/A 12/22/2015    Procedure: Left Heart Cath and Coronary Angiography;  Surgeon: Peter M SwazilandJordan, MD;  [facility];  Service: Cardiovascular;  Laterality: N/A;   Family History  Problem Relation Age of Onset  . Hypertension Father    Social History  Substance Use Topics  . Smoking status: Former Games developermoker  . Smokeless tobacco: Never Used     Comment: Uses Hookah Pipe occasionally  . Alcohol Use: No    Review of Systems  UNABLE TO OBTAIN ROS DUE TO LEVEL 5 CAVEAT    Allergies  Pork-derived products and Statins  Home Medications   Prior to Admission medications   Medication Sig Start Date End Date Taking? Authorizing Provider  acetaminophen (TYLENOL) 500 MG tablet Take 1,000-1,500 mg by mouth 2 (two) times daily as needed (pain).    Historical Provider, MD  aspirin EC 81 MG tablet Take 81 mg by mouth 2 (two) times daily.    Historical  Provider, MD  esomeprazole (NEXIUM) 40 MG capsule Take 1 capsule (40 mg total) by mouth 2 (two) times daily as needed (acid reflux). 12/24/15   Gwenlyn FoundJessica C Copland, MD  ibuprofen (ADVIL,MOTRIN) 200 MG tablet Take 400-600 mg by mouth every 6 (six) hours as needed (pain).     Historical Provider, MD  loratadine (CLARITIN) 10 MG tablet Take 10 mg by mouth daily as needed for allergies.     Historical Provider, MD  metoprolol succinate (TOPROL-XL) 25 MG 24 hr tablet Take 3 tablets (75 mg total) by mouth daily. 01/30/16   Jerelyn ScottMartha Linker, MD  Multiple Vitamin (MULTIVITAMIN WITH MINERALS) TABS tablet Take 1 tablet by mouth daily. Centrum    Historical Provider, MD  PRESCRIPTION MEDICATION Inhale into the lungs at bedtime. CPAP    Historical Provider, MD  Vitamin D, Ergocalciferol, (DRISDOL) 50000 units CAPS capsule Take 1 capsule (50,000 Units total) by mouth 2 (two) times a week. Mondays and Thursdays 12/24/15 12/24/16  Gwenlyn FoundJessica C Copland, MD   BP 121/78 mmHg  Pulse 78  Resp 16  SpO2 100%  Vitals reviewed Physical Exam  Physical Examination: General appearance - alert, uncomfortable appearing, and in some distress Mental status - alert, oriented to person, place, and time Eyes - no conjunctival injection no scleral icterus Mouth -  mucous membranes moist, pharynx normal without lesions Chest - clear to auscultation, no wheezes, rales or rhonchi, symmetric air entry Heart - tachycardic rate, regular rhythm, normal S1, S2, no murmurs, rubs, clicks or gallops Abdomen - soft, nontender, nondistended, no masses or organomegaly Neurological - alert, oriented, normal speech Extremities - peripheral pulses normal, no pedal edema, no clubbing or cyanosis Skin - normal coloration and turgor, no rashes  ED Course  Procedures (including critical care time)  CRITICAL CARE Performed by: Ethelda Chick Total critical care time: 45  minutes Critical care time was exclusive of separately billable procedures and  treating other patients. Critical care was necessary to treat or prevent imminent or life-threatening deterioration. Critical care was time spent personally by me on the following activities: development of treatment plan with patient and/or surrogate as well as nursing, discussions with consultants, evaluation of patient's response to treatment, examination of patient, obtaining history from patient or surrogate, ordering and performing treatments and interventions, ordering and review of laboratory studies, ordering and review of radiographic studies, pulse oximetry and re-evaluation of patient's condition. Labs Review Labs Reviewed  COMPREHENSIVE METABOLIC PANEL - Abnormal; Notable for the following:    Chloride 99 (*)    Glucose, Bld 144 (*)    Creatinine, Ser 1.25 (*)    All other components within normal limits  CBC WITH DIFFERENTIAL/PLATELET  TROPONIN I    Imaging Review Dg Chest Portable 1 View  01/30/2016  CLINICAL DATA:  Chest pain this morning. EXAM: PORTABLE CHEST 1 VIEW COMPARISON:  12/19/2015 FINDINGS: External pacer paddles are noted. The heart is within normal limits in size given the AP projection and portable technique. The mediastinal and hilar contours are within normal limits and stable. Mild chronic interstitial changes but no acute pulmonary findings. The bony thorax is intact. IMPRESSION: No acute cardiopulmonary findings. Electronically Signed   By: Rudie Meyer M.D.   On: 01/30/2016 09:31   I have personally reviewed and evaluated these images and lab results as part of my medical decision-making.   EKG Interpretation   Date/Time:  Friday January 30 2016 08:47:05 EDT Ventricular Rate:  178 PR Interval:  111 QRS Duration: 98 QT Interval:  284 QTC Calculation: 489 R Axis:   18 Text Interpretation:  Age not entered, assumed to be  60 years old for  purpose of ECG interpretation Supraventricular tachycardia ST depression,  probably rate related Since previous  tracing SVT is now present Confirmed  by [facility]  MD, Tzippy Testerman [phone]) on 01/30/2016 9:43:12 AM     REpeat EKG at 9:05AM  Date: 01/30/2016  Rate: 66  Rhythm: normal sinus rhythm with PAC  QRS Axis: normal  Intervals: normal  ST/T Wave abnormalities: normal  Conduction Disutrbances: none  Narrative Interpretation: unremarkable    MDM   Final diagnoses:  SVT (supraventricular tachycardia) (HCC)    Pt presenting with palpitations- seen immediately upon arrival, HR 180s in SVT.  Pt placed on monitor IV access obtained.  Adenosine given x 2 without much response in HR.  Pt given metoprolol IV x 1 as well as vagal maneuvers tried- this resulted in converting to NSR.  Pt was hypotensive during SVT, second IV access obtained and patient given fluid bolus.  After in NSR- has maintained normal BP.    10:21 AM d/w Dr. Duke Salvia, cardiology, she states there is no indication for admission.  Advises increased metoprolol to  daily.  She will pass on his information to [name] in case he can be  scheduled for his ablation prior to June 29th.    Jerelyn Scott, MD 01/30/16 1321

## 2016-01-30 NOTE — ED Notes (Signed)
Patient alert and oriented at present.  Patient noted to be diaphoretic

## 2016-01-30 NOTE — Telephone Encounter (Signed)
Spoke with pt wife she worried that pt needs to be see sooner as during ED visit stated pt needs to be seen next week.  Reviewed with EP, MD, pt already scheduled for 6/29 ablation and procedure scheduled during last visit, so pt does not need and OV next week.  Pt is very interested in moving up his ablation date. Told that message will be sent to MD's nurse to evaluate if pt can have procedure earlier sooner, than currently scheduled.  Pt wife also worried about pt taking the increased dosage of 75 mg by mouth ONCE daily of Lopressor from 50 mg by mouth daily. Pt wife stated that he is already very tired and anxious from being on the 50 mg dosage. Educated that pt to take recommended new dosage and if pt has problems to call our office for additional recommendations.  Verbalized understanding and stated she is awaiting contact from our office on Monday with update on moving up procedure date.

## 2016-01-30 NOTE — ED Notes (Signed)
Patient reports chest pain which began today at 0600.  States this is similar to chest pain he has had in the past.  C/o shortness of breath.  Rates pain 10/10.  Patient on cardiac monitor.  MD at bedside.

## 2016-01-30 NOTE — Telephone Encounter (Signed)
New Message  Pt wife called back. States the pt visited the hospital. States that she would still like to come into the office because the pt symptoms are persistent. No further details, would like to speak with nurse to disclose more.

## 2016-01-30 NOTE — Telephone Encounter (Signed)
New Message  Pt wife called states that the pt's heart beat is up and down. Pt is sweating a lot. His heart beat is from 85 to more than 100 and then back down again.   Pt c/o Shortness Of Breath: STAT if SOB developed within the last 24 hours or pt is noticeably SOB on the phone  1. Are you currently SOB (can you hear that pt is SOB on the phone)? Yes a little  2. How long have you been experiencing SOB? Since 7 am this morning  3. Are you SOB when sitting or when up moving around? It doesn't matter.  4.  Are you currently experiencing any other symptoms? Heart beat is up and down and sweating   No further details.

## 2016-01-30 NOTE — Discharge Instructions (Signed)
Return to the ED with any concerns including chest pain, palpitations, fainting, difficulty breathing, decreased level of alertness/lethargy, or any other alarming symptoms  You should increase the metoprolol dose to 75mg  daily

## 2016-01-30 NOTE — Telephone Encounter (Signed)
Delayed message - Left message to call back

## 2016-01-30 NOTE — ED Notes (Signed)
Pt instructed to blow into empty syringe for vagal maneuver, pt heart rate dropped to 73.

## 2016-02-02 NOTE — Telephone Encounter (Signed)
Spoke with patient and offered him 02/11/16.  He wants to leave the ablation scheduled as is for now.  He will call me back if he decides to move up to that date.

## 2016-02-02 NOTE — Telephone Encounter (Signed)
Follow up   Pt wife is calling back to speak to rn about previous calls

## 2016-02-12 ENCOUNTER — Other Ambulatory Visit (INDEPENDENT_AMBULATORY_CARE_PROVIDER_SITE_OTHER): Payer: BLUE CROSS/BLUE SHIELD | Admitting: *Deleted

## 2016-02-12 DIAGNOSIS — I471 Supraventricular tachycardia: Secondary | ICD-10-CM | POA: Diagnosis not present

## 2016-02-12 LAB — CBC WITH DIFFERENTIAL/PLATELET
Basophils Absolute: 0 cells/uL (ref 0–200)
Basophils Relative: 0 %
EOS PCT: 2 %
Eosinophils Absolute: 120 cells/uL (ref 15–500)
HCT: 45.5 % (ref 38.5–50.0)
HEMOGLOBIN: 15.4 g/dL (ref 13.2–17.1)
LYMPHS ABS: 1740 {cells}/uL (ref 850–3900)
Lymphocytes Relative: 29 %
MCH: 28.7 pg (ref 27.0–33.0)
MCHC: 33.8 g/dL (ref 32.0–36.0)
MCV: 84.9 fL (ref 80.0–100.0)
MPV: 11 fL (ref 7.5–12.5)
Monocytes Absolute: 420 cells/uL (ref 200–950)
Monocytes Relative: 7 %
NEUTROS ABS: 3720 {cells}/uL (ref 1500–7800)
NEUTROS PCT: 62 %
Platelets: 181 10*3/uL (ref 140–400)
RBC: 5.36 MIL/uL (ref 4.20–5.80)
RDW: 14 % (ref 11.0–15.0)
WBC: 6 10*3/uL (ref 3.8–10.8)

## 2016-02-12 LAB — BASIC METABOLIC PANEL
BUN: 22 mg/dL (ref 7–25)
CALCIUM: 9.3 mg/dL (ref 8.6–10.3)
CO2: 24 mmol/L (ref 20–31)
Chloride: 104 mmol/L (ref 98–110)
Creat: 1.18 mg/dL (ref 0.70–1.33)
GLUCOSE: 93 mg/dL (ref 65–99)
Potassium: 4.3 mmol/L (ref 3.5–5.3)
SODIUM: 139 mmol/L (ref 135–146)

## 2016-02-12 NOTE — Addendum Note (Signed)
Addended by: Tonita PhoenixBOWDEN, ROBIN K on: 02/12/2016 03:08 PM   Modules accepted: Orders

## 2016-02-19 ENCOUNTER — Encounter (HOSPITAL_COMMUNITY): Payer: Self-pay | Admitting: Internal Medicine

## 2016-02-19 ENCOUNTER — Ambulatory Visit (HOSPITAL_COMMUNITY)
Admission: RE | Admit: 2016-02-19 | Discharge: 2016-02-20 | Disposition: A | Discharge status: Home / Self Care | Payer: BLUE CROSS/BLUE SHIELD | Source: Ambulatory Visit | Attending: Internal Medicine | Admitting: Internal Medicine

## 2016-02-19 ENCOUNTER — Encounter (HOSPITAL_COMMUNITY)
Admission: RE | Disposition: A | Discharge status: Home / Self Care | Payer: Self-pay | Source: Ambulatory Visit | Attending: Internal Medicine

## 2016-02-19 DIAGNOSIS — K219 Gastro-esophageal reflux disease without esophagitis: Secondary | ICD-10-CM | POA: Insufficient documentation

## 2016-02-19 DIAGNOSIS — Z7982 Long term (current) use of aspirin: Secondary | ICD-10-CM | POA: Insufficient documentation

## 2016-02-19 DIAGNOSIS — G4730 Sleep apnea, unspecified: Secondary | ICD-10-CM | POA: Insufficient documentation

## 2016-02-19 DIAGNOSIS — Z87891 Personal history of nicotine dependence: Secondary | ICD-10-CM | POA: Diagnosis not present

## 2016-02-19 DIAGNOSIS — I471 Supraventricular tachycardia, unspecified: Secondary | ICD-10-CM | POA: Diagnosis present

## 2016-02-19 DIAGNOSIS — E785 Hyperlipidemia, unspecified: Secondary | ICD-10-CM | POA: Insufficient documentation

## 2016-02-19 DIAGNOSIS — Z9889 Other specified postprocedural states: Secondary | ICD-10-CM | POA: Diagnosis present

## 2016-02-19 DIAGNOSIS — Z79899 Other long term (current) drug therapy: Secondary | ICD-10-CM | POA: Diagnosis not present

## 2016-02-19 HISTORY — PX: ELECTROPHYSIOLOGIC STUDY: SHX172A

## 2016-02-19 SURGERY — A-FLUTTER/A-TACH/SVT ABLATION

## 2016-02-19 MED ORDER — SODIUM CHLORIDE 0.9 % IV SOLN
INTRAVENOUS | Status: DC
  Administered 2016-02-19: 07:00:00 via INTRAVENOUS

## 2016-02-19 MED ORDER — IBUPROFEN 200 MG PO TABS: 400.0000 mg | ORAL_TABLET | Freq: Four times a day (QID) | ORAL | Status: DC | PRN

## 2016-02-19 MED ORDER — MIDAZOLAM HCL 5 MG/5ML IJ SOLN
INTRAMUSCULAR | Status: AC
  Filled 2016-02-19: qty 5

## 2016-02-19 MED ORDER — BUPIVACAINE HCL (PF) 0.25 % IJ SOLN
INTRAMUSCULAR | Status: AC
  Filled 2016-02-19: qty 30

## 2016-02-19 MED ORDER — SODIUM CHLORIDE 0.9% FLUSH
3.0000 mL | Freq: Two times a day (BID) | INTRAVENOUS | Status: DC
  Administered 2016-02-19: 3 mL via INTRAVENOUS

## 2016-02-19 MED ORDER — ACETAMINOPHEN 325 MG PO TABS
650.0000 mg | ORAL_TABLET | ORAL | Status: DC | PRN
  Administered 2016-02-19: 650 mg via ORAL
  Filled 2016-02-19: qty 2

## 2016-02-19 MED ORDER — HEPARIN (PORCINE) IN NACL 2-0.9 UNIT/ML-% IJ SOLN
INTRAMUSCULAR | Status: AC
  Filled 2016-02-19: qty 500

## 2016-02-19 MED ORDER — ADULT MULTIVITAMIN W/MINERALS CH
1.0000 | ORAL_TABLET | Freq: Every day | ORAL | Status: DC
  Administered 2016-02-19 – 2016-02-20 (×2): 1 via ORAL
  Filled 2016-02-19 (×2): qty 1

## 2016-02-19 MED ORDER — FENTANYL CITRATE (PF) 100 MCG/2ML IJ SOLN
INTRAMUSCULAR | Status: DC | PRN
  Administered 2016-02-19: 12.5 ug via INTRAVENOUS
  Administered 2016-02-19: 25 ug via INTRAVENOUS
  Administered 2016-02-19 (×2): 12.5 ug via INTRAVENOUS

## 2016-02-19 MED ORDER — ASPIRIN EC 81 MG PO TBEC
81.0000 mg | DELAYED_RELEASE_TABLET | Freq: Two times a day (BID) | ORAL | Status: DC
  Administered 2016-02-19 – 2016-02-20 (×3): 81 mg via ORAL
  Filled 2016-02-19 (×3): qty 1

## 2016-02-19 MED ORDER — ISOPROTERENOL HCL 0.2 MG/ML IJ SOLN
INTRAMUSCULAR | Status: AC
  Filled 2016-02-19: qty 5

## 2016-02-19 MED ORDER — SODIUM CHLORIDE 0.9 % IV SOLN: 250.0000 mL | INTRAVENOUS | Status: DC | PRN

## 2016-02-19 MED ORDER — DIPHENHYDRAMINE HCL 25 MG PO CAPS
25.0000 mg | ORAL_CAPSULE | Freq: Once | ORAL | Status: DC
  Filled 2016-02-19: qty 1

## 2016-02-19 MED ORDER — METOPROLOL SUCCINATE ER 25 MG PO TB24
25.0000 mg | ORAL_TABLET | Freq: Every day | ORAL | Status: DC
  Administered 2016-02-20: 25 mg via ORAL
  Filled 2016-02-19 (×2): qty 1

## 2016-02-19 MED ORDER — MIDAZOLAM HCL 5 MG/5ML IJ SOLN
INTRAMUSCULAR | Status: DC | PRN
  Administered 2016-02-19 (×10): 1 mg via INTRAVENOUS

## 2016-02-19 MED ORDER — SODIUM CHLORIDE 0.9% FLUSH: 3.0000 mL | INTRAVENOUS | Status: DC | PRN

## 2016-02-19 MED ORDER — ONDANSETRON HCL 4 MG/2ML IJ SOLN: 4.0000 mg | Freq: Four times a day (QID) | INTRAMUSCULAR | Status: DC | PRN

## 2016-02-19 MED ORDER — BUPIVACAINE HCL (PF) 0.25 % IJ SOLN
INTRAMUSCULAR | Status: DC | PRN
Start: 2016-02-19 — End: 2016-02-19
  Administered 2016-02-19: 40 mL

## 2016-02-19 MED ORDER — FENTANYL CITRATE (PF) 100 MCG/2ML IJ SOLN
INTRAMUSCULAR | Status: AC
  Filled 2016-02-19: qty 2

## 2016-02-19 MED ORDER — LORATADINE 10 MG PO TABS: 10.0000 mg | ORAL_TABLET | Freq: Every day | ORAL | Status: DC | PRN

## 2016-02-19 MED ORDER — SODIUM CHLORIDE 0.9 % IV SOLN
1.0000 mg | INTRAVENOUS | Status: DC | PRN
  Administered 2016-02-19: 4 ug/min via INTRAVENOUS

## 2016-02-19 SURGICAL SUPPLY — 11 items
BAG SNAP BAND KOVER 36X36 (MISCELLANEOUS) ×3 IMPLANT
CATH CELSIUS THERM D CV 7F (ABLATOR) ×3 IMPLANT
CATH HEX JOS 2-5-2 65CM 6F REP (CATHETERS) ×3 IMPLANT
CATH JOSEPH QUAD ALLRED 6F REP (CATHETERS) ×6 IMPLANT
PACK EP LATEX FREE (CUSTOM PROCEDURE TRAY) ×2
PACK EP LF (CUSTOM PROCEDURE TRAY) ×1 IMPLANT
PAD DEFIB LIFELINK (PAD) ×3 IMPLANT
SHEATH PINNACLE 6F 10CM (SHEATH) ×6 IMPLANT
SHEATH PINNACLE 7F 10CM (SHEATH) ×3 IMPLANT
SHEATH PINNACLE 8F 10CM (SHEATH) ×3 IMPLANT
SHIELD RADPAD SCOOP 12X17 (MISCELLANEOUS) ×3 IMPLANT

## 2016-02-19 NOTE — Progress Notes (Signed)
Pt ambulated to restroom with no difficulty, gait steady, groin site stable level 0.  Raymon MuttonGwen Jude Linck RN

## 2016-02-19 NOTE — H&P (Signed)
HPI [name] returns today for ongoing evaluation of SVT. He is a pleasant 60 yo man who has otherwise been healthy. He was in the hospital earlier this month with SVT and treated with IV adenosine. He is interested in pursuing catheter ablation. He had tried to terminate his episodes with valsalve maneuvers and was ineffective. The had terminated with IV adenosine. Allergies  Allergen Reactions  . Pork-Derived Products Swelling  . Statins     Muscle aches     Current Outpatient Prescriptions  Medication Sig Dispense Refill  . acetaminophen (TYLENOL) 500 MG tablet Take 1,000-1,500 mg by mouth 2 (two) times daily as needed (pain).    [name]. aspirin EC 81 MG tablet Take 81 mg by mouth 2 (two) times daily.    [name]. esomeprazole (NEXIUM) 40 MG capsule Take 1 capsule (40 mg total) by mouth 2 (two) times daily as needed (acid reflux). 60 capsule 11  . ibuprofen (ADVIL,MOTRIN) 200 MG tablet Take 400-600 mg by mouth every 6 (six) hours as needed (pain).     [name]. loratadine (CLARITIN) 10 MG tablet Take 10 mg by mouth daily as needed for allergies.     . metoprolol succinate (TOPROL-XL) 50 MG 24 hr tablet Take 1 tablet (50 mg total) by mouth daily. Take with or immediately following a meal. 30 tablet 11  . Multiple Vitamin (MULTIVITAMIN WITH MINERALS) TABS tablet Take 1 tablet by mouth daily. Centrum    . PRESCRIPTION MEDICATION Inhale into the lungs at bedtime. CPAP    . Vitamin D, Ergocalciferol, (DRISDOL) 50000 units CAPS capsule Take 1 capsule (50,000 Units total) by mouth 2 (two) times a week. Mondays and Thursdays 30 capsule 6   No current facility-administered medications for this visit.     Past Medical History  Diagnosis Date  . Hyperlipidemia   . GERD (gastroesophageal reflux disease)   . DDD (degenerative disc disease), lumbar     L3/L4  . Diverticulosis   . Low sperm motility   . SVT  (supraventricular tachycardia) (HCC)   . Sleep apnea     uses cpap     ROS:  All systems reviewed and negative except as noted in the HPI.   Past Surgical History  Procedure Laterality Date  . Back surgery    . Wisdom tooth extraction    . Cardiac catheterization N/A 12/22/2015    Procedure: Left Heart Cath and Coronary Angiography; Surgeon: Richard M SwazilandJordan, MD; [facility]; Service: Cardiovascular; Laterality: N/A;     Family History  Problem Relation Age of Onset  . Hypertension Father      Social History   Social History  . Marital Status: Married    Spouse Name: N/A  . Number of Children: N/A  . Years of Education: N/A   Occupational History  . Not on file.   Social History Main Topics  . Smoking status: Former Games developermoker  . Smokeless tobacco: Never Used     Comment: Uses Hookah Pipe occasionally  . Alcohol Use: No  . Drug Use: No  . Sexual Activity: Yes   Other Topics Concern  . Not on file   Social History Narrative   Marital Status: Married Engineer, drilling(Richard Walker)   Children: 4 (3 Sons/1 Daughter)   Pets: None    Living Situation: Lives with spouse and 4 children   Origin: He was born in [place].   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    Alcohol Use: None   Drug Use: None   Diet: Regular   Exercise: None   Hobbies: Cards     BP 142/86 mmHg  Pulse 70  Ht 5' 8.75" (1.746 m)  Wt 247 lb 9.6 oz (112.311 kg)  BMI 36.84 kg/m2  SpO2 98%  Physical Exam:  Well appearing 60 yo man, NAD HEENT: Unremarkable Neck: 7 cm JVD, no thyromegally Lymphatics: No adenopathy Back: No CVA tenderness Lungs: Clear with no wheezes HEART: Regular rate rhythm, no murmurs, no rubs, no  clicks Abd: soft, positive bowel sounds, no organomegally, no rebound, no guarding Ext: 2 plus pulses, no edema, no cyanosis, no clubbing Skin: No rashes no nodules Neuro: CN II through XII intact, motor grossly intact  EKG - nsr with normal axis and intervals.   Assess/Plan: 1. SVT - he continues to have symptoms. I have offered him ablation and he is interested in proceeding. 2. Dyslipidemia - he will continue a low fat diet.   Richard MunchGregg Walker,M.D.         EP Attending  Patient seen and examined. Agree with above. No change since prior clinic visit. Will plan to proceed with EP Study and catheter ablation of SVT. The risks/benefits/goals/expectations of the procedure have been discussed with the patient and he wishes to proceed.  Richard ReevesGregg Walker,M.D.

## 2016-02-19 NOTE — Progress Notes (Signed)
Site area: rt IJ venous sheath Site Prior to Removal:  Level 0 Pressure Applied For:  10 minutes Manual:   yes Patient Status During Pull:  stable Post Pull Site:  Level  0 Post Pull Instructions Given:  yes Post Pull Pulses Present: na Dressing Applied:  Small tegaderm Bedrest begins @  Comments:   

## 2016-02-19 NOTE — Discharge Summary (Signed)
ELECTROPHYSIOLOGY PROCEDURE DISCHARGE SUMMARY    Patient ID: Richard AmesSam F Clardy,  MRN: [id number], DOB/AGE: 31-Mar-1956 60 y.o.  Admit date: 02/19/2016 Discharge date: 02/20/16  Primary Care Physician: Abbe AmsterdamOPLAND,JESSICA, MD Electrophysiologist: Dr. 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 HLD  Allergies  Allergen Reactions  . Pork-Derived Products Swelling  . Statins     Muscle aches     Procedures This Admission: 1.  Electrophysiology study and radiofrequency catheter ablation on 02/19/16 by Dr Ladona Ridgelaylor.  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  There were no inducible arrhythmias following ablation and no early apparent complications.   Brief HPI: Richard Walker is a 60 y.o. male with a past medical history as outlined above. He has had increasing tachypalpitations with documented SVT.   Risks, benefits, and alternatives to ablation were reviewed with the patient who wished to proceed.   Hospital Course:  The patient was admitted and underwent EPS/RFCA with details as outlined above.He was monitored on telemetry overnight which demonstrated SB/SR 50-60, very brief, approx 1 minute/less episodes of PAFib with CVR noted x3.  Groin and neck incisions were without complication.  They were examined and  Considered stable for discharge to home.  Follow up will be arranged in 4 weeks.  Wound care and restrictions were reviewed with the patient prior to discharge. Will have him wean off his metoprolol, to continue 25ng daily x1 week then off, the patient is aware.  Physical Exam: Filed Vitals:   02/19/16 1035 02/19/16 1040 02/19/16 1045 02/19/16 1106  BP: 108/76 111/74 114/69   Pulse: 62 60 70   Temp:    97.5 F (36.4 C)  TempSrc:    Oral  Resp: 17 16 19    Height:      Weight:      SpO2: 98% 98% 96%     GEN- The patient is well appearing, alert  and oriented x 3 today.   HEENT: normocephalic, atraumatic; sclera clear, conjunctiva pink; hearing intact; oropharynx clear; neck supple, no JVP Lymph- no cervical lymphadenopathy Lungs- Clear to ausculation bilaterally, normal work of breathing.  No wheezes, rales, rhonchi Heart- Regular rate and rhythm, no murmurs, rubs or gallops, PMI not laterally displaced GI- soft, non-tender, non-distended, bowel sounds present, no hepatosplenomegaly Extremities- no clubbing, cyanosis, or edema; DP/PT/radial pulses 2+ bilaterally, groin without hematoma/bruit/bleeding MS- no significant deformity or atrophy Skin- warm and dry, no rash or lesion Psych- euthymic mood, full affect Neuro- strength and sensation are intact  R neck sit stable without bleeding or hematoma   Discharge Vitals: stable  Labs:   Lab Results  Component Value Date   WBC 6.0 02/12/2016   HGB 15.4 02/12/2016   HCT 45.5 02/12/2016   MCV 84.9 02/12/2016   PLT 181 02/12/2016   No results for input(s): NA, K, CL, CO2, BUN, CREATININE, CALCIUM, PROT, BILITOT, ALKPHOS, ALT, AST, GLUCOSE in the last 168 hours.  Invalid input(s): LABALBU  Discharge Medications:    Medication List    ASK your doctor about these medications        acetaminophen 500 MG tablet  Commonly known as:  TYLENOL  Take 1,000-1,500 mg by mouth 2 (two) times daily as needed (pain).     aspirin EC 81 MG tablet  Take 81 mg by mouth 2 (two) times daily.     esomeprazole 40 MG capsule  Commonly known as:  NEXIUM  Take 1 capsule (40 mg total) by mouth 2 (two) times daily as needed (acid reflux).     ibuprofen 200 MG tablet  Commonly known as:  ADVIL,MOTRIN  Take 400-600 mg by mouth every 6 (six) hours as needed (pain).     loratadine 10 MG tablet  Commonly known as:  CLARITIN  Take 10 mg by mouth daily as needed for allergies.     metoprolol succinate 25 MG 24 hr tablet  Commonly known as:  TOPROL-XL  Take 3 tablets (75 mg total) by mouth  daily.     multivitamin with minerals Tabs tablet  Take 1 tablet by mouth daily. Centrum     PRESCRIPTION MEDICATION  Inhale into the lungs at bedtime. CPAP     Vitamin D (Ergocalciferol) 50000 units Caps capsule  Commonly known as:  DRISDOL  Take 1 capsule (50,000 Units total) by mouth 2 (two) times a week. Mondays and Thursdays        Disposition:  Home      Follow-up Information    Follow up with Lewayne BuntingGregg Taylor, MD On 03/19/2016.   Specialty:  Cardiology   Why:  3:00PM   Contact information:   [address] [place] [address] [phone]       Duration of Discharge Encounter: Greater than 30 minutes including physician time.  Norma FredricksonSigned, Renee Ursuy, PA-C  02/19/2016 3:30 PM  EP Attending  Patient seen and examined. Agree with above. Stable for DC. Usual followup. He will continue his low dose beta blocker so as not to have a rebound.  Leonia ReevesGregg Taylor,M.D.

## 2016-02-19 NOTE — Progress Notes (Signed)
Pt arrived to floor, placed on telemetry, alert oriented, groin site level 0, right neck site level 0, denies pain or discomfort, bedrest to end at 1645, will continue to monitor closely.  Raymon MuttonGwen Yazeed Pryer RN

## 2016-02-19 NOTE — Progress Notes (Signed)
Site area: rt groin Site Prior to Removal:  Level 0 Pressure Applied For:  10 minutes   Manual:   yes Patient Status During Pull:  stable Post Pull Site:  Level  0 Post Pull Instructions Given:  yes Post Pull Pulses Present: yes Dressing Applied:  Small tegaderm Bedrest begins @  1045 Comments:   IV saline locked

## 2016-02-20 DIAGNOSIS — E785 Hyperlipidemia, unspecified: Secondary | ICD-10-CM | POA: Diagnosis not present

## 2016-02-20 DIAGNOSIS — I471 Supraventricular tachycardia: Secondary | ICD-10-CM | POA: Diagnosis not present

## 2016-02-20 DIAGNOSIS — K219 Gastro-esophageal reflux disease without esophagitis: Secondary | ICD-10-CM | POA: Diagnosis not present

## 2016-02-20 DIAGNOSIS — G4730 Sleep apnea, unspecified: Secondary | ICD-10-CM | POA: Diagnosis not present

## 2016-02-20 MED ORDER — METOPROLOL SUCCINATE ER 25 MG PO TB24: 25.0000 mg | ORAL_TABLET | Freq: Every day | ORAL | Status: DC

## 2016-02-20 NOTE — Discharge Instructions (Signed)
No driving for 1 week. No lifting over 5 lbs for 1 week. No vigorous or sexual activity for 1 week. You may return to work on 03/08/16. Keep procedure site clean & dry. If you notice increased pain, swelling, bleeding or pus, call/return!  You may shower, but no soaking baths/hot tubs/pools for 1 week.

## 2016-03-02 ENCOUNTER — Encounter: Payer: Self-pay | Admitting: Internal Medicine

## 2016-03-06 ENCOUNTER — Telehealth: Payer: Self-pay | Admitting: Cardiology

## 2016-03-06 NOTE — Telephone Encounter (Signed)
Patient called stating that he was fatigued and took a shower and suddenly had palpitations with heart rate 140bpm.  He did vagal maneuvers several times without any improvement and now heart rate 60-80bpm after taking metoprolol 75mg .  He says that now his heart rate feels irregular going from 60-85bpm.  Patient concerned that he is not in normal rhythm.  I have instructed him to go to ER for EKG - he is not anticoagulated (CHADS2VASC score is 0).  Patient agrees to go.

## 2016-03-08 ENCOUNTER — Telehealth: Payer: Self-pay | Admitting: Internal Medicine

## 2016-03-08 NOTE — Telephone Encounter (Signed)
New message    Pt wife verbalized that pt needs a written document for him to return to work and resume regular activities  Or if he has any restrictions they need to be listed   Pt wife states that she needs it today and she can come and pick it up

## 2016-03-08 NOTE — Telephone Encounter (Signed)
I spoke with pt's wife, Carron BrazenFeryal.  Feryal states pt had been given a note that he could return to work today 03/08/16. Suzi RootsFeryal states pt states his employer asked for more information about pt's hospitalization and return to work. Suzi RootsFeryal states she is not sure exactly what information is needed and asked me to call pt at [phone] to get more information.  I attempted to contact pt at [phone] and did not get an answer.

## 2016-03-08 NOTE — Telephone Encounter (Signed)
Pt's wife called back. Pt's wife states pt's employer is requesting pt complete FMLA for recent hospitalization. Pt's wife advised pt will have to complete his portion of FMLA and sign authorization, then bring for to our Medical Records. Pt's wife verbalized understanding.

## 2016-03-11 ENCOUNTER — Ambulatory Visit: Payer: Self-pay | Admitting: Neurology

## 2016-03-11 ENCOUNTER — Encounter: Payer: Self-pay | Admitting: *Deleted

## 2016-03-19 ENCOUNTER — Ambulatory Visit (INDEPENDENT_AMBULATORY_CARE_PROVIDER_SITE_OTHER): Payer: BLUE CROSS/BLUE SHIELD | Admitting: Internal Medicine

## 2016-03-19 ENCOUNTER — Encounter: Payer: Self-pay | Admitting: Internal Medicine

## 2016-03-19 VITALS — BP 136/84 | HR 64 | Ht 70.0 in | Wt 241.0 lb

## 2016-03-19 DIAGNOSIS — R0602 Shortness of breath: Secondary | ICD-10-CM

## 2016-03-19 DIAGNOSIS — I471 Supraventricular tachycardia, unspecified: Secondary | ICD-10-CM

## 2016-03-19 MED ORDER — METOPROLOL TARTRATE 25 MG PO TABS: ORAL_TABLET | ORAL | 1 refills | Status: DC

## 2016-03-19 NOTE — Progress Notes (Signed)
HPI [name] returns today for followup, s/p SVT ablation. He is a pleasant 60 yo man who has otherwise been healthy with a long h/o SVT, who underwent catheter ablation several weeks ago. 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 He describes an episode while he was in the shower 2 weeks ago where he felt like his heart was racing. The HR's were variable from 150-110/min. The episode lasted about 15 minutes. Allergies  Allergen Reactions  . Pork-Derived Products Swelling  . Statins     Muscle aches     Current Outpatient Prescriptions  Medication Sig Dispense Refill  . acetaminophen (TYLENOL) 500 MG tablet Take 1,000-1,500 mg by mouth 2 (two) times daily as needed (pain).    [name] aspirin EC 81 MG tablet Take 81 mg by mouth 2 (two) times daily.    [name] esomeprazole (NEXIUM) 40 MG capsule Take 1 capsule (40 mg total) by mouth 2 (two) times daily as needed (acid reflux). 60 capsule 11  . ibuprofen (ADVIL,MOTRIN) 200 MG tablet Take 400-600 mg by mouth every 6 (six) hours as needed (pain).     [name] loratadine (CLARITIN) 10 MG tablet Take 10 mg by mouth daily as needed for allergies.     . Multiple Vitamin (MULTIVITAMIN WITH MINERALS) TABS tablet Take 1 tablet by mouth daily. Centrum    . PRESCRIPTION MEDICATION Inhale into the lungs at bedtime. CPAP    . Vitamin D, Ergocalciferol, (DRISDOL) 50000 units CAPS capsule Take 1 capsule (50,000 Units total) by mouth 2 (two) times a week. Mondays and Thursdays 30 capsule 6  . metoprolol tartrate (LOPRESSOR) 25 MG tablet Take 1-4 tablets as directed for racing heart 50 tablet 1   No current facility-administered medications for this visit.      Past Medical History:  Diagnosis Date  . DDD (degenerative disc disease), lumbar    L3/L4  . Diverticulosis   . GERD (gastroesophageal reflux disease)   . Hyperlipidemia   . Low sperm motility   . Sleep apnea    uses cpap   . SVT (supraventricular tachycardia)  (HCC)     ROS:   All systems reviewed and negative except as noted in the HPI.   Past Surgical History:  Procedure Laterality Date  . BACK SURGERY    . CARDIAC CATHETERIZATION N/A 12/22/2015   Procedure: Left Heart Cath and Coronary Angiography;  Surgeon: Peter M Swaziland, MD;  Location: [facility] INVASIVE CV LAB;  Service: Cardiovascular;  Laterality: N/A;  . ELECTROPHYSIOLOGIC STUDY N/A 02/19/2016   Procedure: SVT Ablation;  Surgeon: Marinus Maw, MD;  Location: [facility] CV LAB;  Service: Cardiovascular;  Laterality: N/A;  . WISDOM TOOTH EXTRACTION       Family History  Problem Relation Age of Onset  . Hypertension Father      Social History   Social History  . Marital status: Married    Spouse name: N/A  . Number of children: N/A  . Years of education: N/A   Occupational History  . Not on file.   Social History Main Topics  . Smoking status: Former Games developer  . Smokeless tobacco: Never Used     Comment: Uses Hookah Pipe occasionally  . Alcohol use No  . Drug use: No  . Sexual activity: Yes   Other Topics Concern  . Not on file   Social History Narrative   Marital Status: Married Engineer, drilling)   Children:  4 (3  Sons/1 Daughter)   Pets:  None    Living Situation: Lives with spouse and 4 children   Origin:  He was born in [place].   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     Alcohol Use:  None   Drug Use:  None   Diet:  Regular   Exercise:  None   Hobbies:  Cards     BP 136/84   Pulse 64   Ht  (1.778 m)   Wt 241 lb (109.3 kg)   BMI 34.58 kg/m   Physical Exam:  Well appearing 60 yo man, NAD HEENT: Unremarkable Neck:  7 cm JVD, no thyromegally Lymphatics:  No adenopathy Back:  No CVA tenderness Lungs:  Clear with no wheezes HEART:  Regular rate rhythm, no murmurs, no rubs, no clicks Abd:  soft, positive bowel  sounds, no organomegally, no rebound, no guarding Ext:  2 plus pulses, no edema, no cyanosis, no clubbing Skin:  No rashes no nodules Neuro:  CN II through XII intact, motor grossly intact  EKG - nsr with normal axis and intervals.   Assess/Plan: 1. SVT - he is s/p ablation. His episode of palpitations is likely not SVT but could be atrial fib. I have instructed him to call if he has another. I would recommend he return to our office if the HR remains elevated. 2. Dyslipidemia - he will continue a low fat diet.  3. Dyspnea - he continues to have sob and fatigue with exertion. I will ask him to repeat his 2D echo.  Leonia Reeves.D.

## 2016-03-19 NOTE — Patient Instructions (Addendum)
Medication Instructions:  Your physician has recommended you make the following change in your medication:  1) start Lopressor 25 mg ---take    Labwork: None ordered   Testing/Procedures:  Your physician has requested that you have an echocardiogram. Echocardiography is a painless test that uses sound waves to create images of your heart. It provides your doctor with information about the size and shape of your heart and how well your heart's chambers and valves are working. This procedure takes approximately one hour. There are no restrictions for this procedure.   Follow-Up: Your physician wants you to follow-up in: 6 months wit Dr Court Joy will receive a reminder letter in the mail two months in advance. If you don't receive a letter, please call our office to schedule the follow-up appointment.   Any Other Special Instructions Will Be Listed Below (If Applicable).     If you need a refill on your cardiac medications before your next appointment, please call your pharmacy.

## 2016-03-25 ENCOUNTER — Other Ambulatory Visit (HOSPITAL_COMMUNITY): Payer: BLUE CROSS/BLUE SHIELD

## 2016-04-01 ENCOUNTER — Ambulatory Visit (INDEPENDENT_AMBULATORY_CARE_PROVIDER_SITE_OTHER): Payer: BLUE CROSS/BLUE SHIELD | Admitting: Nurse Practitioner

## 2016-04-01 ENCOUNTER — Other Ambulatory Visit: Payer: Self-pay

## 2016-04-01 ENCOUNTER — Encounter (INDEPENDENT_AMBULATORY_CARE_PROVIDER_SITE_OTHER): Payer: BLUE CROSS/BLUE SHIELD

## 2016-04-01 ENCOUNTER — Ambulatory Visit (HOSPITAL_COMMUNITY): Payer: BLUE CROSS/BLUE SHIELD | Attending: Cardiovascular Disease

## 2016-04-01 VITALS — BP 112/68 | HR 55 | Ht 70.0 in | Wt 238.2 lb

## 2016-04-01 DIAGNOSIS — I471 Supraventricular tachycardia: Secondary | ICD-10-CM | POA: Diagnosis not present

## 2016-04-01 DIAGNOSIS — I517 Cardiomegaly: Secondary | ICD-10-CM | POA: Insufficient documentation

## 2016-04-01 DIAGNOSIS — R009 Unspecified abnormalities of heart beat: Secondary | ICD-10-CM

## 2016-04-01 DIAGNOSIS — R0602 Shortness of breath: Secondary | ICD-10-CM | POA: Diagnosis not present

## 2016-04-01 DIAGNOSIS — R0600 Dyspnea, unspecified: Secondary | ICD-10-CM | POA: Diagnosis present

## 2016-04-01 NOTE — Patient Instructions (Signed)
Medication Instructions:  Your physician recommends that you continue on your current medications as directed. Please refer to the Current Medication list given to you today.   Labwork: None Ordered   Testing/Procedures: Your physician has recommended that you wear an event monitor. Event monitors are medical devices that record the heart's electrical activity. Doctors most often us these monitors to diagnose arrhythmias. Arrhythmias are problems with the speed or rhythm of the heartbeat. The monitor is a small, portable device. You can wear one while you do your normal daily activities. This is usually used to diagnose what is causing palpitations/syncope (passing out).    Follow-Up: Your physician recommends that you schedule a follow-up appointment in: 6 weeks with Dr. Ladona Ridgelaylor   If you need a refill on your cardiac medications before your next appointment, please call your pharmacy.   Thank you for choosing [facility]! Eligha BridegroomMichelle Barbar Brede, RN [phone]

## 2016-04-06 ENCOUNTER — Telehealth: Payer: Self-pay | Admitting: *Deleted

## 2016-04-06 NOTE — Telephone Encounter (Signed)
SPOKE TO PT ABOUT RESULTS AND VERBALIZED UNDERSTANDING OF NORMAL LV FUNCTION FROM ECHO RESULTS

## 2016-04-06 NOTE — Telephone Encounter (Signed)
-----   Message from Marinus MawGregg W Taylor, MD sent at 04/04/2016  4:39 PM EDT ----- Normal LV function.

## 2016-04-07 ENCOUNTER — Telehealth: Payer: Self-pay | Admitting: *Deleted

## 2016-04-07 MED ORDER — METOPROLOL TARTRATE 25 MG PO TABS: ORAL_TABLET | ORAL | 1 refills | Status: DC

## 2016-04-07 NOTE — Telephone Encounter (Signed)
[employer] faxed a critical report on this pt to the office this morning for Dr Ladona Ridgelaylor to review. Per the pts event monitor report from yesterday at 4:00 PM (CT), the pt was in Sinus Rhythm w/PAC at a rate of 64 bpm.  Pt then at 4:01 PM (CT) yesterday, went into A-FIB RVR at a rate of 120 bpm.  Contacted the pt about this recorded event, and he reports he was laying down on the bed at that time, as he always does after getting off of work.  Pt states he only felt a "quick flutter sensation with mild sob for a minute or so, then this went away and I felt fine." Pt states that these episodes seem to happen when he is laying down.  Pt reports at the recorded time of event, he was in no acute distress and had no chest pain.  Pt states that "if I felt like I was in immediate threat, I would call Dr Lubertha Basqueaylor's office, or refer to the ER." Pt states that so far today, he feels completely asymptomatic with no cardiac complaints.  Informed the pt that Dr Ladona Ridgelaylor is in the office today, so I will go and show him this recorded event, and someone will follow-up with him thereafter, with any further recommendations given by Dr Ladona Ridgelaylor.  Pt verbalized understanding, agrees with this plan, and gracious for all the assistance provided.

## 2016-04-07 NOTE — Telephone Encounter (Signed)
Notified the pt that Dr Ladona Ridgelaylor reviewed his recorded event monitor report, and recommends that we continue to monitor the pt through his event monitor , and advised the pt to continue on his current med regimen.  Pt did want to add that the metoprolol tartrate Dr Ladona Ridgelaylor switched him too on 7/28 OV, he never went to pick up, for he told his pharmacy he was already on this.  Informed the pt that the Metoprolol Dr Ladona Ridgelaylor switched him to at that OV was from succinate to tartrate, and he absolutely needs to pick this up and take this as Dr Ladona Ridgelaylor advised at that visit.  Pt states he will need the prescription to be called back into [facility], for he told them to get rid of that script, for he thought he was already on that medication.  Provided the pt education on the different between metoprolol succinate and tartrate.   Told him to STOP the succinate and pick up the tartrate and take exactly as prescribed.  Told pt he cannot take these both at the same time.  Pt verbalized understanding and agrees with this plan.  Pt gracious for all the assistance provided.  Will route this message to Dr Ladona Ridgelaylor and RN for their information.

## 2016-04-07 NOTE — Addendum Note (Signed)
Addended by: Loa SocksMARTIN, IVY M on: 04/07/2016 11:56 AM   Modules accepted: Orders

## 2016-04-15 ENCOUNTER — Encounter: Payer: Self-pay | Admitting: Neurology

## 2016-04-15 ENCOUNTER — Ambulatory Visit (INDEPENDENT_AMBULATORY_CARE_PROVIDER_SITE_OTHER): Payer: BLUE CROSS/BLUE SHIELD | Admitting: Neurology

## 2016-04-15 VITALS — BP 118/72 | HR 70 | Resp 20 | Ht 69.0 in | Wt 241.0 lb

## 2016-04-15 DIAGNOSIS — J342 Deviated nasal septum: Secondary | ICD-10-CM

## 2016-04-15 DIAGNOSIS — J302 Other seasonal allergic rhinitis: Secondary | ICD-10-CM | POA: Diagnosis not present

## 2016-04-15 DIAGNOSIS — Z9989 Dependence on other enabling machines and devices: Principal | ICD-10-CM

## 2016-04-15 DIAGNOSIS — G4733 Obstructive sleep apnea (adult) (pediatric): Secondary | ICD-10-CM | POA: Diagnosis not present

## 2016-04-15 MED ORDER — VITAMIN D (ERGOCALCIFEROL) 1.25 MG (50000 UNIT) PO CAPS: 50000.0000 [IU] | ORAL_CAPSULE | ORAL | 6 refills | Status: DC

## 2016-04-15 NOTE — Progress Notes (Signed)
SLEEP MEDICINE CLINIC   Provider:  Melvyn Novas, M D  Referring Provider: Dr Richard Walker Walker , MD   Primary Care Physician:  Richard Amsterdam, MD  Chief Complaint  Patient presents with  . Follow-up    cpap going well    HPI:  Richard Walker Richard Walker is a 60 y.o. male , seen here as a referral  from [facility]   Chief complaint according to patient : I had a sleep study with Dr Richard Walker Walker in [facility] 14 years ago , and later Dr. Hazle Walker in [place] in 2002.  Not longer working for me. I have a 60 year old mask.  " I cannot get supplies and my wife reports snoring again. " Dr. Haroldine Walker saw the patient through the year 2016 and diagnosed him with septal nasal deviation, turbinate hypertrophy, history of nasal trauma, chronic maxillary and ethmoid sinusitis, laryngitis, and obstructive sleep apnea with snoring on CPAP. Dr. Haroldine Walker requested the repeat of a sleep study and to treat the patient again for obstructive sleep apnea as indicated. He also wanted to make sure that the future use of CPAP and the concept of surgery on his nose will make the CPAP compliance easier with a better nasal patency. This visit took place on 08/06/2015 with a carbon copy to Dr. Roxan Walker art, primary care physician.  Sleep habits are as follows:The patient works often early shifts in a [employer] he usually works mornings sometimes afternoons but rarely nights. He works as a Production designer, theatre/television/film. His bedtime varies depending on his work hours. He works one night per week. His average sleep time at night is between 9 and 10 PM, but he doesn't necessarily fall asleep promptly and often voice about his day or is not ready to wind down. He goes to the bedroom to sleep not to watch TV, the bedroom is cool, quiet and dark. After going to sleep initially he wakes up frequently, most of the time he wakes up because his mouth is parched and dry and he developed headaches. This is better while using CPAP, but he has no supplies.    Since his nasal septum is deviated from a fracture, he seems to habitually resume mouth breathing. He goes to the bathroom at night at least twice. He rises in the morning for an early shift as early as 4 AM, for an afternoon show a shift he may sleep until 7. Frequently he wakes up feeling tired not restored or refreshed and with headaches. The headaches are described as throbbing of morbid the forehead and behind the eyes giving him a pressure sensation. He frequently takes Advil or Tylenol in the morning.   Sleep medical history and family sleep history:  Parents - father was a snorer. materanl grandfather was thunderous. The patient does not have any childhood sleep disorder. Social history:  The patient lives with his wife, he has not smoked cigarettes in 25 years, he does not drink alcohol, after he developed palpitations and tachycardic attacks he stopped using caffeine 1 or 2 cups of coffee now for the day it used to be much more. He does not use sodas or tea.  Interval history from 04/15/2016 [name] is seen today after his recent split-night polysomnography on 12/29/2015 which confirmed severe sleep apnea with an AHI of 69.8 he slept only in nonsupine position and there was no REM sleep documented during the diagnostic part of the study. He also had several periodic limb movement related arousals. Once he  was titrated to CPAP at a pressure of 9 cm there was a reduction of the AHI to 2.2 there was no significant hypoxemia and no CO2 retention noted. The sleep architecture normalized under CPAP titration. The patient did not tolerate a nasal pillow very well, trialed a dream ware mask and is now using a full face mask. He is using auto Pap between 7 and 12 cm water pressure his sleepiness score has decreased to 5 points and his fatigue severity is still at 55 points,  The patient's compliance has been 97% with 8 hours and 16 minutes maximum use average user time of 5 hours 11 minutes. The mean  average daily pressure is 8.9 cm water and the average AHI is 2.7. The machine does not need to be reset this is a very good result. I like for the patient to receive a  CPAP carrying case the that it has a better protection while  traveling.  Review of Systems: Out of a complete 14 system review, the patient complains of only the following symptoms, and all other reviewed systems are negative. Shift work, sleep deprived, snoring, dry mouth, nasal obstruction.  Epworth score  6 , Fatigue severity score 35  , depression score N/a    Social History   Social History  . Marital status: Married    Spouse name: N/A  . Number of children: N/A  . Years of education: N/A   Occupational History  . Not on file.   Social History Main Topics  . Smoking status: Former Games developer  . Smokeless tobacco: Never Used     Comment: Uses Hookah Pipe occasionally  . Alcohol use No  . Drug use: No  . Sexual activity: Yes   Other Topics Concern  . Not on file   Social History Narrative   Marital Status: Married Engineer, drilling)   Children:  4 (3 Sons/1 Daughter)   Pets:  None    Living Situation: Lives with spouse and 4 children   Origin:  He was born in [place].   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     Alcohol Use:  None   Drug Use:  None   Diet:  Regular   Exercise:  None   Hobbies:  Cards    Family History  Problem Relation Age of Onset  . Hypertension Father     Past Medical History:  Diagnosis Date  . DDD (degenerative disc disease), lumbar    L3/L4  . Diverticulosis   . GERD (gastroesophageal reflux disease)   . Hyperlipidemia   . Low sperm motility   . Sleep apnea    uses cpap   . SVT (supraventricular tachycardia) (HCC)     Past Surgical History:  Procedure Laterality Date  . BACK SURGERY    . CARDIAC CATHETERIZATION N/A 12/22/2015   Procedure:  Left Heart Cath and Coronary Angiography;  Surgeon: Peter M Swaziland, MD;  Location: [facility] INVASIVE CV LAB;  Service: Cardiovascular;  Laterality: N/A;  . ELECTROPHYSIOLOGIC STUDY N/A 02/19/2016   Procedure: SVT Ablation;  Surgeon: Marinus Maw, MD;  Location: [facility] INVASIVE CV LAB;  Service: Cardiovascular;  Laterality: N/A;  . WISDOM TOOTH EXTRACTION      Current Outpatient Prescriptions  Medication Sig Dispense Refill  . acetaminophen (TYLENOL) 500 MG tablet Take 1,000-1,500 mg by mouth 2 (two) times daily as needed (pain).    [name] aspirin EC  81 MG tablet Take 81 mg by mouth 2 (two) times daily.    [name]. esomeprazole (NEXIUM) 40 MG capsule Take 1 capsule (40 mg total) by mouth 2 (two) times daily as needed (acid reflux). 60 capsule 11  . ibuprofen (ADVIL,MOTRIN) 200 MG tablet Take 400-600 mg by mouth every 6 (six) hours as needed (pain).     [name]. loratadine (CLARITIN) 10 MG tablet Take 10 mg by mouth daily as needed for allergies.     . metoprolol tartrate (LOPRESSOR) 25 MG tablet Take 1-4 tablets as directed for racing heart 50 tablet 1  . Multiple Vitamin (MULTIVITAMIN WITH MINERALS) TABS tablet Take 1 tablet by mouth daily. Centrum    . PRESCRIPTION MEDICATION Inhale into the lungs at bedtime. CPAP    . Vitamin D, Ergocalciferol, (DRISDOL) 50000 units CAPS capsule Take 1 capsule (50,000 Units total) by mouth 2 (two) times a week. Mondays and Thursdays (Patient not taking: Reported on 04/15/2016) 30 capsule 6   No current facility-administered medications for this visit.     Allergies as of 04/15/2016 - Review Complete 04/15/2016  Allergen Reaction Noted  . Pork-derived products Swelling 12/19/2015  . Statins  01/08/2016    Vitals: BP 118/72   Pulse 70   Resp 20   Ht 5\' 9"  (1.753 m)   Wt 241 lb (109.3 kg)   BMI 35.59 kg/m  Last Weight:  Wt Readings from Last 1 Encounters:  04/15/16 241 lb (109.3 kg)   WUJ:WJXBBMI:Body mass index is 35.59 kg/m.     Last Height:   Ht Readings from Last 1 Encounters:   04/15/16 5\' 9"  (1.753 m)    Physical exam:  General: The patient is awake, alert and appears not in acute distress. The patient is well groomed. Head: Normocephalic, atraumatic. Neck is supple. Mallampati 4  neck circumference 17.5 . Nasal airflow restricted , TMJ is not  evident . Retrognathia is seen.  Cardiovascular:  Regular rate and rhythm , without  murmurs or carotid bruit, and without distended neck veins. Respiratory: Lungs are clear to auscultation. Skin:  Without evidence of edema, or rash Trunk: BMI is elevated - The patient's posture is erect.    Neurologic exam : The patient is awake and alert, oriented to place and time.   Memory subjective described as intact.  Attention span & concentration ability appears normal.  Speech is fluent,  without dysarthria, dysphonia or aphasia.  Mood and affect are appropriate.  Cranial nerves: Pupils are equal and briskly reactive to light. Funduscopic exam without  evidence of pallor or edema. Extraocular movements  in vertical and horizontal planes intact and without nystagmus. Visual fields by finger perimetry are intact. Hearing to finger rub intact.   Facial sensation intact to fine touch.  Facial motor strength is symmetric and tongue and uvula move midline. Shoulder shrug was symmetrical.   Motor exam:  Tone, muscle bulk and symmetric strength in all extremities. Sensory:  Fine touch, pinprick and vibration and  Proprioception tested in the upper extremities was normal. Coordination: Rapid alternating movements in the fingers/hands was normal. Finger-to-nose maneuver  normal without evidence of ataxia, dysmetria or tremor. Gait and station: Patient walks without assistive device and is able unassisted to climb up to the exam table. Strength within normal limits. Stance is stable and normal.   Deep tendon reflexes: in the  upper and lower extremities are symmetric and intact. Babinski maneuver response is downgoing.  The patient  was advised of the nature of the  diagnosed sleep disorder , the treatment options and risks for general a health and wellness arising from not treating the condition.  I spent more than 40 minutes of face to face time with the patient. Greater than 50% of time was spent in counseling and coordination of care. We have discussed the diagnosis and differential and I answered the patient's questions.     Assessment:  After physical and neurologic examination, review of laboratory studies,  Personal review of imaging studies, reports of other /same  Imaging studies ,  Results of polysomnography/ neurophysiology testing and pre-existing records as far as provided in visit., my assessment is   1) Richard Walker Richard Walker is likely still suffering from obstructive sleep apnea as he has described them as his wife has also witnessed. I would strongly recommend that his turbinate reduction and nasal septoplasty should be performed as it will help him to tolerate nasal CPAP better. He has been using CPAP now for about 14 years.    He will continue to use  a dream wear machine by [employer] and we will fit him with the interface that is best tolerated at this time.  He may need a heated tube  to allow him a high humidity level without condensation  He will still need to undergo nasal septoplasty , this will affect his CPAP use positively. He was hospitalized for atrial fib, underwent ablation. Dr. Sharrell KuGreg Taylor, cardiology.     Plan:  Treatment plan and additional workup :  SPLIT night, CO2 and nasal passage obstruction.  ENT procedure is not yet scheduled.     Porfirio Mylararmen Nykolas Bacallao MD  04/15/2016   CC: Pearline CablesJessica C Copland, Md [address] [place], [place] [id number]

## 2016-04-22 ENCOUNTER — Ambulatory Visit: Payer: BLUE CROSS/BLUE SHIELD | Admitting: Neurology

## 2016-04-28 ENCOUNTER — Telehealth: Payer: Self-pay

## 2016-04-28 NOTE — Telephone Encounter (Signed)
Received critical event monitor report from [employer], which showed some Atrial Flutter last night while patient was trying to sleep. Called patient, patient was asymptomatic at the time of critical event. Informed patient that Dr. Ladona Ridgelaylor would be informed and our office will call him with recommendations, if needed. Patient verbalized understanding.  Patient had cardiac event monitor ordered at nurse visit on 04/01/16,  after coming into the office reporting an irregular heart rate. At that time patient was bradycardia, EKG on chart.   [facility], Dr. Lubertha Basqueaylor's nurse.

## 2016-05-17 NOTE — Telephone Encounter (Signed)
Dr Ladona Ridgelaylor reviewed, no changes at present and continue to wear monitor.  Call with any problems

## 2016-05-28 ENCOUNTER — Ambulatory Visit (INDEPENDENT_AMBULATORY_CARE_PROVIDER_SITE_OTHER): Payer: BLUE CROSS/BLUE SHIELD | Admitting: Internal Medicine

## 2016-05-28 ENCOUNTER — Encounter: Payer: Self-pay | Admitting: Internal Medicine

## 2016-05-28 VITALS — BP 122/82 | HR 73 | Ht 70.0 in | Wt 240.8 lb

## 2016-05-28 DIAGNOSIS — I471 Supraventricular tachycardia: Secondary | ICD-10-CM

## 2016-05-28 NOTE — Patient Instructions (Addendum)
Medication Instructions:  Your physician recommends that you continue on your current medications as directed. Please refer to the Current Medication list given to you today.   Labwork: None Ordered   Testing/Procedures: None Ordered   Follow-Up: Your physician wants you to follow-up in: 6 months with Dr. Taylor. You will receive a reminder letter in the mail two months in advance. If you don't receive a letter, please call our office to schedule the follow-up appointment.    Any Other Special Instructions Will Be Listed Below (If Applicable).     If you need a refill on your cardiac medications before your next appointment, please call your pharmacy.   

## 2016-05-28 NOTE — Progress Notes (Signed)
HPI Richard Walker returns today for followup, s/p SVT ablation. He is a pleasant 60 yo man who has otherwise been healthy with a long h/o SVT, who underwent catheter ablation several months ago. 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 I saw him about 2 months ago and he was experiencing palpitations. We have had him wear a heart monitor and he was found to have atrial fib lasting several minutes at a time. Since then he has started taking fish oil and ASA. His CHADSVASC score is zero. Allergies  Allergen Reactions  . Pork-Derived Products Swelling  . Statins     Muscle aches     Current Outpatient Prescriptions  Medication Sig Dispense Refill  . acetaminophen (TYLENOL) 500 MG tablet Take 1,000-1,500 mg by mouth 2 (two) times daily as needed (pain).    [name] aspirin 325 MG tablet Take 325 mg by mouth daily.    [name] esomeprazole (NEXIUM) 40 MG capsule Take 1 capsule (40 mg total) by mouth 2 (two) times daily as needed (acid reflux). 60 capsule 11  . ibuprofen (ADVIL,MOTRIN) 200 MG tablet Take 400-600 mg by mouth every 6 (six) hours as needed (pain).     [name] loratadine (CLARITIN) 10 MG tablet Take 10 mg by mouth daily as needed for allergies.     . metoprolol tartrate (LOPRESSOR) 25 MG tablet Take 1-4 tablets as directed for racing heart 50 tablet 1  . Multiple Vitamin (MULTIVITAMIN WITH MINERALS) TABS tablet Take 1 tablet by mouth daily. Centrum    . Omega-3 Fatty Acids (FISH OIL PO) Take 1 capsule by mouth daily.    [name] PRESCRIPTION MEDICATION Inhale into the lungs at bedtime. CPAP    . Vitamin D, Ergocalciferol, (DRISDOL) 50000 units CAPS capsule Take 1 capsule (50,000 Units total) by mouth 2 (two) times a week. Mondays and Thursdays 30 capsule 6   No current facility-administered medications for this visit.      Past Medical History:  Diagnosis Date  . DDD (degenerative disc disease), lumbar    L3/L4  . Diverticulosis   . GERD (gastroesophageal  reflux disease)   . Hyperlipidemia   . Low sperm motility   . Sleep apnea    uses cpap   . SVT (supraventricular tachycardia) (HCC)     ROS:   All systems reviewed and negative except as noted in the HPI.   Past Surgical History:  Procedure Laterality Date  . BACK SURGERY    . CARDIAC CATHETERIZATION N/A 12/22/2015   Procedure: Left Heart Cath and Coronary Angiography;  Surgeon: Peter M Swaziland, MD;  Location: [facility] INVASIVE CV LAB;  Service: Cardiovascular;  Laterality: N/A;  . ELECTROPHYSIOLOGIC STUDY N/A 02/19/2016   Procedure: SVT Ablation;  Surgeon: Marinus Maw, MD;  Location: [facility] INVASIVE CV LAB;  Service: Cardiovascular;  Laterality: N/A;  . WISDOM TOOTH EXTRACTION       Family History  Problem Relation Age of Onset  . Hypertension Father      Social History   Social History  . Marital status: Married    Spouse name: N/A  . Number of children: N/A  . Years of education: N/A   Occupational History  . Not on file.   Social History Main Topics  . Smoking status: Former Games developer  . Smokeless tobacco: Never Used     Comment: Uses Hookah Pipe occasionally  . Alcohol use No  . Drug use: No  . Sexual activity:  Yes   Other Topics Concern  . Not on file   Social History Narrative   Marital Status: Married Engineer, drilling(Richard Walker)   Children:  4 (3 Sons/1 Daughter)   Pets:  None    Living Situation: Lives with spouse and 4 children   Origin:  He was born in [place].   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     Alcohol Use:  None   Drug Use:  None   Diet:  Regular   Exercise:  None   Hobbies:  Cards     BP 122/82   Pulse 73   Ht 5\' 10"  (1.778 m)   Wt 240 lb 12.8 oz (109.2 kg)   BMI 34.55 kg/m   Physical Exam:  Well appearing 60 yo man, NAD HEENT: Unremarkable Neck:  7 cm JVD, no thyromegally Lymphatics:  No adenopathy Back:  No  CVA tenderness Lungs:  Clear with no wheezes HEART:  Regular rate rhythm, no murmurs, no rubs, no clicks Abd:  soft, positive bowel sounds, no organomegally, no rebound, no guarding Ext:  2 plus pulses, no edema, no cyanosis, no clubbing Skin:  No rashes no nodules Neuro:  CN II through XII intact, motor grossly intact  EKG - nsr with normal axis and intervals.   Assess/Plan: 1. SVT - he is s/p ablation. His episode of palpitations are well controlled. No recurrent SVT 2. Dyslipidemia - he will continue a low fat diet.  3. Dyspnea - he continues to have sob and fatigue with exertion. I will ask him to repeat his 2D echo 4. Atrial fib - he is having paroxysms of atrial fib. I have discussed the issues with the patient. He has no indication for systemic anti-coagulation. He will continue full strength ASA.   Leonia ReevesGregg Maverik Walker,M.D.

## 2016-07-23 HISTORY — PX: TURBINATE REDUCTION: SHX6157

## 2016-07-28 ENCOUNTER — Ambulatory Visit: Payer: BLUE CROSS/BLUE SHIELD | Admitting: Family Medicine

## 2016-07-29 ENCOUNTER — Ambulatory Visit (INDEPENDENT_AMBULATORY_CARE_PROVIDER_SITE_OTHER): Payer: BLUE CROSS/BLUE SHIELD | Admitting: Medical

## 2016-07-29 ENCOUNTER — Encounter: Payer: Self-pay | Admitting: Medical

## 2016-07-29 VITALS — BP 106/78 | HR 81 | Temp 98.0°F | Ht 70.0 in | Wt 249.0 lb

## 2016-07-29 DIAGNOSIS — R739 Hyperglycemia, unspecified: Secondary | ICD-10-CM

## 2016-07-29 DIAGNOSIS — R42 Dizziness and giddiness: Secondary | ICD-10-CM

## 2016-07-29 DIAGNOSIS — R350 Frequency of micturition: Secondary | ICD-10-CM | POA: Diagnosis not present

## 2016-07-29 DIAGNOSIS — R5383 Other fatigue: Secondary | ICD-10-CM

## 2016-07-29 DIAGNOSIS — R319 Hematuria, unspecified: Secondary | ICD-10-CM

## 2016-07-29 LAB — POC URINALSYSI DIPSTICK (AUTOMATED)
Bilirubin, UA: NEGATIVE
Glucose, UA: NEGATIVE
Ketones, UA: NEGATIVE
LEUKOCYTES UA: NEGATIVE
Nitrite, UA: NEGATIVE
PROTEIN UA: NEGATIVE
UROBILINOGEN UA: 0.2
pH, UA: 5.5

## 2016-07-29 MED ORDER — FLUTICASONE PROPIONATE 50 MCG/ACT NA SUSP: 2.0000 | Freq: Every day | NASAL | 1 refills | Status: DC

## 2016-07-29 NOTE — Progress Notes (Signed)
Pre visit review using our clinic review tool, if applicable. No additional management support is needed unless otherwise documented below in the visit note. 

## 2016-07-29 NOTE — Progress Notes (Signed)
Subjective:    Patient ID: Richard Walker, male    DOB: 04/20/1956, 60 y.o.   MRN: [id number]  HPI  Pt in for evaluation.  Pt states yesterday morning when he woke up he felt fatigued and dizzy. He states at work his energy picked up and he felt better progessively. Pt states yesterday morning on standing felt light headed. Pt not reporting any obvious sinus symptoms. Pt has hx of deviated septum. He will have surgery on Jul 30, 2016.  Regarding when he felt dizzy and fatigued his pulse was 72. Also no chest pain or sob. Was dizzy for at most 30 minutes yesterday.  Pt states today he feel a lot better.  Pt has history of cardiac ablation about a month. Hx of svt. He is having less frequent episodes of tachycardia but not as high as before. He states last night pulse was 130 He felt the rate increase. He held his breath/followed advise of his cardiologist and pulse came down to 60. He states immediatley when he felt dizzy and tired he checked his pulse and confirmed not high.   Pt has some questions on shingles vaccine, flu vaccine and he wants to get tdap when he is due.   Review of Systems  Constitutional: Positive for fatigue. Negative for chills and fever.  HENT: Negative for congestion.   Respiratory: Negative for cough, chest tightness, shortness of breath and wheezing.   Cardiovascular: Negative for chest pain and palpitations.  Gastrointestinal: Negative for abdominal pain.  Endocrine: Negative for polydipsia, polyphagia and polyuria.  Genitourinary: Positive for frequency. Negative for difficulty urinating, discharge, flank pain, genital sores, penile pain, penile swelling, scrotal swelling and urgency.  Musculoskeletal: Negative for back pain.  Skin: Negative for rash.  Neurological: Positive for dizziness and weakness. Negative for seizures, speech difficulty, numbness and headaches.       See hpi. Dizziness just last 30 minutes. No associated signs or symptoms.    Hematological: Negative for adenopathy. Does not bruise/bleed easily.  Psychiatric/Behavioral: Negative for behavioral problems and confusion.    Past Medical History:  Diagnosis Date  . DDD (degenerative disc disease), lumbar    L3/L4  . Diverticulosis   . GERD (gastroesophageal reflux disease)   . Hyperlipidemia   . Low sperm motility   . Sleep apnea    uses cpap   . SVT (supraventricular tachycardia) (HCC)      Social History   Social History  . Marital status: Married    Spouse name: N/A  . Number of children: N/A  . Years of education: N/A   Occupational History  . Not on file.   Social History Main Topics  . Smoking status: Former Games developer  . Smokeless tobacco: Never Used     Comment: Uses Hookah Pipe occasionally  . Alcohol use No  . Drug use: No  . Sexual activity: Yes   Other Topics Concern  . Not on file   Social History Narrative   Marital Status: Married Engineer, drilling)   Children:  4 (3 Sons/1 Daughter)   Pets:  None    Living Situation: Lives with spouse and 4 children   Origin:  He was born in [place].   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     Alcohol Use:  None   Drug Use:  None   Diet:  Regular   Exercise:  None  Hobbies:  Cards    Past Surgical History:  Procedure Laterality Date  . BACK SURGERY    . CARDIAC CATHETERIZATION N/A 12/22/2015   Procedure: Left Heart Cath and Coronary Angiography;  Surgeon: Peter M Swaziland, MD;  Location: [facility] INVASIVE CV LAB;  Service: Cardiovascular;  Laterality: N/A;  . ELECTROPHYSIOLOGIC STUDY N/A 02/19/2016   Procedure: SVT Ablation;  Surgeon: Marinus Maw, MD;  Location: [facility] INVASIVE CV LAB;  Service: Cardiovascular;  Laterality: N/A;  . WISDOM TOOTH EXTRACTION      Family History  Problem Relation Age of Onset  . Hypertension Father     Allergies  Allergen Reactions   . Pork-Derived Products Swelling  . Statins     Muscle aches    Current Outpatient Prescriptions on File Prior to Visit  Medication Sig Dispense Refill  . acetaminophen (TYLENOL) 500 MG tablet Take 1,000-1,500 mg by mouth 2 (two) times daily as needed (pain).    [name] aspirin 325 MG tablet Take 325 mg by mouth daily.    [name] esomeprazole (NEXIUM) 40 MG capsule Take 1 capsule (40 mg total) by mouth 2 (two) times daily as needed (acid reflux). 60 capsule 11  . ibuprofen (ADVIL,MOTRIN) 200 MG tablet Take 400-600 mg by mouth every 6 (six) hours as needed (pain).     [name] loratadine (CLARITIN) 10 MG tablet Take 10 mg by mouth daily as needed for allergies.     . metoprolol tartrate (LOPRESSOR) 25 MG tablet Take 1-4 tablets as directed for racing heart 50 tablet 1  . Multiple Vitamin (MULTIVITAMIN WITH MINERALS) TABS tablet Take 1 tablet by mouth daily. Centrum    . Omega-3 Fatty Acids (FISH OIL PO) Take 1 capsule by mouth daily.    [name] PRESCRIPTION MEDICATION Inhale into the lungs at bedtime. CPAP    . Vitamin D, Ergocalciferol, (DRISDOL) 50000 units CAPS capsule Take 1 capsule (50,000 Units total) by mouth 2 (two) times a week. Mondays and Thursdays 30 capsule 6   No current facility-administered medications on file prior to visit.     BP 106/78 (BP Location: Left Arm, Patient Position: Sitting, Cuff Size: Normal)   Pulse 81   Temp 98 F (36.7 C) (Oral)   Ht 5\' 10"  (1.778 m)   Wt 249 lb (112.9 kg)   SpO2 98%   BMI 35.73 kg/m       Objective:   Physical Exam   General Mental Status- Alert. General Appearance- Not in acute distress.(diziness since yesterday am)  Skin General: Color- Normal Color. Moisture- Normal Moisture.  Neck Carotid Arteries- Normal color. Moisture- Normal Moisture. No carotid bruits. No JVD.  Chest and Lung Exam Auscultation: Breath Sounds:-Normal.  Cardiovascular Auscultation:Rythm- Regular, rate and rhythm(ausculated for sustained duration and no  change) Murmurs & Other Heart Sounds:Auscultation of the heart reveals- No Murmurs.  Abdomen Inspection:-Inspeection Normal. Palpation/Percussion:Note:No mass. Palpation and Percussion of the abdomen reveal- Non Tender, Non Distended + BS, no rebound or guarding.   Neurologic Cranial Nerve exam:- CN III-XII intact(No nystagmus), symmetric smile. Drift Test:- No drift. Romberg Exam:- Negative.  Heal to Toe Gait exam:-Normal. Finger to Nose:- Normal/Intact Strength:- 5/5 equal and symmetric strength both upper and lower extremities.      Assessment & Plan:  For your recent fatigue and dizziness will get labs and Urine dip. Evaluate your blood volume, thyroid, liver, kidney, psa, electrolytes and average blood sugar.  Stay well hydrated. If you get recurrent episodes of dizzy and fatigue check you pulse as you have  history of svt. If heart rate over 100 follow guidelines cardiologist gave.  If you get dizzy with neurologic signs or symptoms then ED evaluation.  For your mild left ear discomfort will rx flonase. You may have ear pressure.  Follow up in 7 days or as needed  Not pt has not been dizzy since yesterday am and his energy is now picked up/close to normal.  Joneric Streight, Ramon Dredge, PA-C

## 2016-07-29 NOTE — Patient Instructions (Addendum)
For your recent fatigue and dizziness will get labs and Urine dip. Evaluate your blood volume, thyroid, liver, kidney, psa, electrolytes and average blood sugar.  Stay well hydrated. If you get recurrent episodes of dizzy and fatigue check you pulse as you have history of svt. If heart rate over 100 follow guidelines cardiologist gave.  If you get dizzy with neurologic signs or symptoms then ED evaluation.  For your mild left ear discomfort will rx flonase. You may have ear pressure.  Follow up in 7 days or as needed

## 2016-07-30 ENCOUNTER — Telehealth: Payer: Self-pay | Admitting: Medical

## 2016-07-30 DIAGNOSIS — R739 Hyperglycemia, unspecified: Secondary | ICD-10-CM

## 2016-07-30 LAB — TSH: TSH: 0.89 u[IU]/mL (ref 0.35–4.50)

## 2016-07-30 LAB — CBC WITH DIFFERENTIAL/PLATELET
BASOS ABS: 0 10*3/uL (ref 0.0–0.1)
Basophils Relative: 0.3 % (ref 0.0–3.0)
EOS ABS: 0.1 10*3/uL (ref 0.0–0.7)
Eosinophils Relative: 2.3 % (ref 0.0–5.0)
HEMATOCRIT: 46 % (ref 39.0–52.0)
HEMOGLOBIN: 15.4 g/dL (ref 13.0–17.0)
LYMPHS PCT: 28.1 % (ref 12.0–46.0)
Lymphs Abs: 1.7 10*3/uL (ref 0.7–4.0)
MCHC: 33.6 g/dL (ref 30.0–36.0)
MCV: 84.3 fl (ref 78.0–100.0)
MONOS PCT: 6.9 % (ref 3.0–12.0)
Monocytes Absolute: 0.4 10*3/uL (ref 0.1–1.0)
Neutro Abs: 3.7 10*3/uL (ref 1.4–7.7)
Neutrophils Relative %: 62.4 % (ref 43.0–77.0)
Platelets: 198 10*3/uL (ref 150.0–400.0)
RBC: 5.45 Mil/uL (ref 4.22–5.81)
RDW: 13.8 % (ref 11.5–15.5)
WBC: 6 10*3/uL (ref 4.0–10.5)

## 2016-07-30 LAB — URINE CULTURE: ORGANISM ID, BACTERIA: NO GROWTH

## 2016-07-30 LAB — COMPREHENSIVE METABOLIC PANEL
ALBUMIN: 4.2 g/dL (ref 3.5–5.2)
ALK PHOS: 91 U/L (ref 39–117)
ALT: 15 U/L (ref 0–53)
AST: 16 U/L (ref 0–37)
BILIRUBIN TOTAL: 0.5 mg/dL (ref 0.2–1.2)
BUN: 14 mg/dL (ref 6–23)
CALCIUM: 9.3 mg/dL (ref 8.4–10.5)
CO2: 30 mEq/L (ref 19–32)
CREATININE: 1.02 mg/dL (ref 0.40–1.50)
Chloride: 104 mEq/L (ref 96–112)
GFR: 79.15 mL/min (ref >60.00)
Glucose, Bld: 110 mg/dL — ABNORMAL HIGH (ref 70–99)
Potassium: 4.7 mEq/L (ref 3.5–5.1)
Sodium: 141 mEq/L (ref 135–145)
TOTAL PROTEIN: 6.8 g/dL (ref 6.0–8.3)

## 2016-07-30 LAB — HEMOGLOBIN A1C: Hgb A1c MFr Bld: 5.7 % (ref 4.6–6.5)

## 2016-07-30 LAB — PSA: PSA: 0.97 ng/mL (ref 0.10–4.00)

## 2016-07-30 NOTE — Telephone Encounter (Signed)
Pt blood sugar elevated yesterday. I put in a1c. Can you run that.

## 2016-07-30 NOTE — Telephone Encounter (Signed)
A1c done.

## 2016-08-09 ENCOUNTER — Other Ambulatory Visit: Payer: Self-pay | Admitting: Otolaryngology

## 2016-08-14 ENCOUNTER — Emergency Department (HOSPITAL_BASED_OUTPATIENT_CLINIC_OR_DEPARTMENT_OTHER)
Admission: EM | Admit: 2016-08-14 | Discharge: 2016-08-15 | Disposition: A | Discharge status: Home / Self Care | Payer: BLUE CROSS/BLUE SHIELD | Attending: Emergency Medicine | Admitting: Emergency Medicine

## 2016-08-14 ENCOUNTER — Encounter (HOSPITAL_BASED_OUTPATIENT_CLINIC_OR_DEPARTMENT_OTHER): Payer: Self-pay | Admitting: Emergency Medicine

## 2016-08-14 DIAGNOSIS — Z79899 Other long term (current) drug therapy: Secondary | ICD-10-CM | POA: Diagnosis not present

## 2016-08-14 DIAGNOSIS — R040 Epistaxis: Secondary | ICD-10-CM | POA: Diagnosis not present

## 2016-08-14 DIAGNOSIS — Z87891 Personal history of nicotine dependence: Secondary | ICD-10-CM | POA: Insufficient documentation

## 2016-08-14 MED ORDER — OXYMETAZOLINE HCL 0.05 % NA SOLN
1.0000 | Freq: Once | NASAL | Status: AC
  Administered 2016-08-15: 1 via NASAL
  Filled 2016-08-14: qty 15

## 2016-08-14 NOTE — ED Triage Notes (Signed)
Patient states that he had surgery last week to his right nare. The patient sneezed today and it started to bleed about 30 minutes ago

## 2016-08-14 NOTE — ED Provider Notes (Signed)
[facility] Provider Note   CSN: [id number] Arrival date & time: 08/14/16  2309  By signing my name below, I, Richard Walker, attest that this documentation has been prepared under the direction and in the presence of Shon Batonourtney F Horton, MD . Electronically Signed: Modena JanskyAlbert Walker, Scribe. 08/14/2016. 11:40 PM.  History   Chief Complaint Chief Complaint  Patient presents with  . Epistaxis   The history is provided by the patient and the spouse. No language interpreter was used.   HPI Comments: Richard Walker is a 60 y.o. male who presents to the Emergency Department complaining of intermittent nasal bleeding that started about an hour ago. He states he had a septal turbinate reduction and septal reconstruction on his right nare earlier this week.  ENT Dr. Haroldine Lawsrossley. He reports that today he sneezed and he started bleeding. He has used napkins to stop the bleeding. He admits to recent aspirin use. He denies any other complaints.     PCP: Abbe AmsterdamOPLAND,JESSICA, MD  Past Medical History:  Diagnosis Date  . DDD (degenerative disc disease), lumbar    L3/L4  . Diverticulosis   . GERD (gastroesophageal reflux disease)   . Hyperlipidemia   . Low sperm motility   . Sleep apnea    uses cpap   . SVT (supraventricular tachycardia) [facility](HCC)     Patient Active Problem List   Diagnosis Date Noted  . Troponin I above reference range 12/19/2015  . SVT (supraventricular tachycardia) (HCC) 04/23/2015  . Abdominal pain, epigastric 06/03/2013  . Nausea alone 06/03/2013  . Diarrhea 06/03/2013  . Unspecified vitamin D deficiency 04/09/2013  . Esophageal reflux 04/09/2013  . Hepatitis B carrier (HCC) 04/09/2013  . Other and unspecified hyperlipidemia 04/09/2013  . Unspecified sleep apnea 04/09/2013    Past Surgical History:  Procedure Laterality Date  . BACK SURGERY    . CARDIAC CATHETERIZATION N/A 12/22/2015   Procedure: Left Heart Cath and Coronary Angiography;  Surgeon: Peter M SwazilandJordan,  MD;  [facility] CV LAB;  Service: Cardiovascular;  Laterality: N/A;  . ELECTROPHYSIOLOGIC STUDY N/A 02/19/2016   Procedure: SVT Ablation;  Surgeon: Marinus MawGregg W Taylor, MD;  [facility];  Service: Cardiovascular;  Laterality: N/A;  . WISDOM TOOTH EXTRACTION         Home Medications    Prior to Admission medications   Medication Sig Start Date End Date Taking? Authorizing Provider  acetaminophen (TYLENOL) 500 MG tablet Take 1,000-1,500 mg by mouth 2 (two) times daily as needed (pain).    Historical Provider, MD  aspirin 325 MG tablet Take 325 mg by mouth daily.    Historical Provider, MD  esomeprazole (NEXIUM) 40 MG capsule Take 1 capsule (40 mg total) by mouth 2 (two) times daily as needed (acid reflux). 12/24/15   Gwenlyn FoundJessica C Copland, MD  fluticasone (FLONASE) 50 MCG/ACT nasal spray Place 2 sprays into both nostrils daily. 07/29/16   Ramon DredgeEdward Saguier, PA-C  ibuprofen (ADVIL,MOTRIN) 200 MG tablet Take 400-600 mg by mouth every 6 (six) hours as needed (pain).     Historical Provider, MD  loratadine (CLARITIN) 10 MG tablet Take 10 mg by mouth daily as needed for allergies.     Historical Provider, MD  metoprolol tartrate (LOPRESSOR) 25 MG tablet Take 1-4 tablets as directed for racing heart 04/07/16   Marinus MawGregg W Taylor, MD  Multiple Vitamin (MULTIVITAMIN WITH MINERALS) TABS tablet Take 1 tablet by mouth daily. Centrum    Historical Provider, MD  Omega-3 Fatty Acids (FISH  OIL PO) Take 1 capsule by mouth daily.    Historical Provider, MD  PRESCRIPTION MEDICATION Inhale into the lungs at bedtime. CPAP    Historical Provider, MD  Vitamin D, Ergocalciferol, (DRISDOL) 50000 units CAPS capsule Take 1 capsule (50,000 Units total) by mouth 2 (two) times a week. Mondays and Thursdays 04/15/16 04/16/17  Melvyn Novasarmen Dohmeier, MD    Family History Family History  Problem Relation Age of Onset  . Hypertension Father     Social History Social History  Substance Use Topics  . Smoking status:  Former Games developermoker  . Smokeless tobacco: Never Used     Comment: Uses Hookah Pipe occasionally  . Alcohol use No     Allergies   Pork-derived products and Statins   Review of Systems Review of Systems  HENT: Positive for nosebleeds (Right nare). Negative for facial swelling.   Neurological: Negative for dizziness.  All other systems reviewed and are negative.    Physical Exam Updated Vital Signs BP 154/89 (BP Location: Right Arm)   Pulse 64   Temp 97.1 F (36.2 C) (Oral)   Resp 18   Ht 5\' 10"  (1.778 m)   Wt 245 lb (111.1 kg)   SpO2 100%   BMI 35.15 kg/m   Physical Exam  Constitutional: He is oriented to person, place, and time. He appears well-developed and well-nourished. No distress.  HENT:  Head: Normocephalic and atraumatic.  Dry blood noted over the mustache, no active bleeding noted from the bilateral nares, no clots noted, dried blood noted in the left nasal cavity, oropharynx clear  Cardiovascular: Normal rate and regular rhythm.   Pulmonary/Chest: Effort normal. No respiratory distress.  Musculoskeletal: He exhibits no edema.  Neurological: He is alert and oriented to person, place, and time.  Skin: Skin is warm and dry.  Psychiatric: He has a normal mood and affect.  Nursing note and vitals reviewed.    ED Treatments / Results  DIAGNOSTIC STUDIES: Oxygen Saturation is 100% on RA, normal by my interpretation.    COORDINATION OF CARE: 11:45 PM- Pt advised of plan for treatment and pt agrees.  Labs (all labs ordered are listed, but only abnormal results are displayed) Labs Reviewed - No data to display  EKG  EKG Interpretation None       Radiology No results found.  Procedures Procedures (including critical care time)  Medications Ordered in ED Medications  oxymetazoline (AFRIN) 0.05 % nasal spray 1 spray (1 spray Each Nare Given 08/15/16 0012)     Initial Impression / Assessment and Plan / ED Course  I have reviewed the triage vital  signs and the nursing notes.  Pertinent labs & imaging results that were available during my care of the patient were reviewed by me and considered in my medical decision making (see chart for details).  Clinical Course    Patient presents with nosebleed. Recent surgery. Reports he is due to have stitches out next week. No active bleeding on exam. No notable clots. Exam is limited. He was encouraged by his ENT to be evaluated. He also states that his ENT told him he can get "some nasal spray." He may be referring to Afrin. I hesitate to do any further evaluation or treatment given recent surgery. He was provided Afrin but instructed to confirm with ENT that this is okay to use. He was also instructed if he has recurrent bleeding he needs to go to [facility] for definitive evaluation and ENT evaluation given recent surgery. Continue to  avoid blowing his nose.  Hold aspirin.  After history, exam, and medical workup I feel the patient has been appropriately medically screened and is safe for discharge home. Pertinent diagnoses were discussed with the patient. Patient was given return precautions.   Final Clinical Impressions(s) / ED Diagnoses   Final diagnoses:  Epistaxis    New Prescriptions Discharge Medication List as of 08/15/2016 12:02 AM     I personally performed the services described in this documentation, which was scribed in my presence. The recorded information has been reviewed and is accurate.     Shon Baton, MD 08/15/16 [phone]

## 2016-08-15 NOTE — Discharge Instructions (Signed)
You were seen today for a nosebleed.  I do not see any blood clots on a limited exam.  You will be given afrin.  Please verify with your ENT doctor that it is ok to use.  If you rebleed, call your ENT and go to [facility] for further evaluation.

## 2016-10-12 ENCOUNTER — Telehealth: Payer: Self-pay | Admitting: Family Medicine

## 2016-10-12 NOTE — Telephone Encounter (Signed)
Patient Name: Lanelle BalSAM Dyal DOB: 04-11-1956 Initial Comment Caller says that her husband has tightness in his chest and throat, and difficulty breathing Nurse Assessment Nurse: Yetta BarreJones, RN, Miranda Date/Time (Eastern Time): 10/12/2016 1:07:30 PM Confirm and document reason for call. If symptomatic, describe symptoms. ---Caller states her husband has been having episodes of tightness in chest and throat off and on for 1 month. Symptoms last for a few min. He is not currently having any symptoms. Does the patient have any new or worsening symptoms? ---Yes Will a triage be completed? ---Yes Related visit to physician within the last 2 weeks? ---No Does the PT have any chronic conditions? (i.e. diabetes, asthma, etc.) ---Yes List chronic conditions. ---hx of SVT Is this a behavioral health or substance abuse call? ---No Guidelines Guideline Title Affirmed Question Affirmed Notes Chest Pain [1] Chest pain lasts > 5 minutes AND [2] occurred > 3 days ago (72 hours) AND [3] NO chest pain or cardiac symptoms now Final Disposition User See Physician within 24 Hours Yetta BarreJones, RN, Miranda Comments NO appt available with PCP. Appt scheduled for tomorrow 2/21, at 1130 with Kristine GarbeEdward Saguire, PA Referrals REFERRED TO PCP OFFICE Disagree/Comply: Comply

## 2016-10-13 ENCOUNTER — Ambulatory Visit (HOSPITAL_BASED_OUTPATIENT_CLINIC_OR_DEPARTMENT_OTHER)
Admission: RE | Admit: 2016-10-13 | Discharge: 2016-10-13 | Disposition: A | Discharge status: Home / Self Care | Payer: BLUE CROSS/BLUE SHIELD | Source: Ambulatory Visit | Attending: Medical | Admitting: Medical

## 2016-10-13 ENCOUNTER — Telehealth: Payer: Self-pay | Admitting: Medical

## 2016-10-13 ENCOUNTER — Ambulatory Visit (INDEPENDENT_AMBULATORY_CARE_PROVIDER_SITE_OTHER): Payer: BLUE CROSS/BLUE SHIELD | Admitting: Medical

## 2016-10-13 ENCOUNTER — Encounter: Payer: Self-pay | Admitting: Medical

## 2016-10-13 VITALS — BP 127/71 | HR 77 | Temp 97.9°F | Resp 16 | Ht 70.0 in | Wt 250.0 lb

## 2016-10-13 DIAGNOSIS — R000 Tachycardia, unspecified: Secondary | ICD-10-CM

## 2016-10-13 DIAGNOSIS — R0981 Nasal congestion: Secondary | ICD-10-CM | POA: Diagnosis not present

## 2016-10-13 DIAGNOSIS — R002 Palpitations: Secondary | ICD-10-CM

## 2016-10-13 DIAGNOSIS — R0789 Other chest pain: Secondary | ICD-10-CM

## 2016-10-13 DIAGNOSIS — R5383 Other fatigue: Secondary | ICD-10-CM

## 2016-10-13 DIAGNOSIS — R0602 Shortness of breath: Secondary | ICD-10-CM | POA: Diagnosis not present

## 2016-10-13 LAB — COMPREHENSIVE METABOLIC PANEL
ALK PHOS: 91 U/L (ref 39–117)
ALT: 17 U/L (ref 0–53)
AST: 17 U/L (ref 0–37)
Albumin: 4.1 g/dL (ref 3.5–5.2)
BILIRUBIN TOTAL: 0.5 mg/dL (ref 0.2–1.2)
BUN: 18 mg/dL (ref 6–23)
CO2: 30 mEq/L (ref 19–32)
Calcium: 9.3 mg/dL (ref 8.4–10.5)
Chloride: 104 mEq/L (ref 96–112)
Creatinine, Ser: 0.95 mg/dL (ref 0.40–1.50)
GFR: 85.86 mL/min (ref >60.00)
GLUCOSE: 87 mg/dL (ref 70–99)
Potassium: 4.2 mEq/L (ref 3.5–5.1)
SODIUM: 139 meq/L (ref 135–145)
TOTAL PROTEIN: 6.9 g/dL (ref 6.0–8.3)

## 2016-10-13 LAB — CBC WITH DIFFERENTIAL/PLATELET
BASOS ABS: 0 10*3/uL (ref 0.0–0.1)
Basophils Relative: 0.8 % (ref 0.0–3.0)
Eosinophils Absolute: 0.1 10*3/uL (ref 0.0–0.7)
Eosinophils Relative: 2 % (ref 0.0–5.0)
HCT: 46 % (ref 39.0–52.0)
Hemoglobin: 15.3 g/dL (ref 13.0–17.0)
LYMPHS ABS: 1.9 10*3/uL (ref 0.7–4.0)
Lymphocytes Relative: 33.1 % (ref 12.0–46.0)
MCHC: 33.3 g/dL (ref 30.0–36.0)
MCV: 85.1 fl (ref 78.0–100.0)
Monocytes Absolute: 0.6 10*3/uL (ref 0.1–1.0)
Monocytes Relative: 9.8 % (ref 3.0–12.0)
NEUTROS ABS: 3.1 10*3/uL (ref 1.4–7.7)
NEUTROS PCT: 54.3 % (ref 43.0–77.0)
PLATELETS: 186 10*3/uL (ref 150.0–400.0)
RBC: 5.41 Mil/uL (ref 4.22–5.81)
RDW: 13.7 % (ref 11.5–15.5)
WBC: 5.6 10*3/uL (ref 4.0–10.5)

## 2016-10-13 LAB — TROPONIN I: TNIDX: 0 ug/l (ref 0.00–0.06)

## 2016-10-13 NOTE — Telephone Encounter (Signed)
You did tell pt tomorrow and he is scheduled for tomorrow. He told [name] DecemberSharon appointment was next Wednesday. Will you investigate and verify that he changed appointment and that he is not confused on the date.

## 2016-10-13 NOTE — Patient Instructions (Addendum)
For your recent symptoms of chest pain, tightness with elevated hr we need to get stat labs today. However, if you get recurrent symptoms as before would need to be evaluated in the ED down stairs.  I am going to try to get you in with your cardiologist asap. Hopefully latest Friday this week.  Continue to use metoprolol as you cardiologist advised.  Continue flonase for nasal congestion.  We will call you later today with stat labs. Please get cxr today.  Follow up with cardiologist hopefully later this week. Follow up as regularly scheduled with pcp

## 2016-10-13 NOTE — Progress Notes (Signed)
Pre visit review using our clinic review tool, if applicable. No additional management support is needed unless otherwise documented below in the visit note/SLS  

## 2016-10-13 NOTE — Telephone Encounter (Signed)
Patient is scheduled with Cardiology for tomorrow, he is requesting xray and lab results

## 2016-10-13 NOTE — Progress Notes (Signed)
Subjective:    Patient ID: Richard Walker, male    DOB: 05-13-1956, 61 y.o.   MRN: [id number]  HPI   Pt states he feels ok right now(no current symptoms)   Pt states last night he has some chest pain, tightness, and he felt like heart was racing.   4 pm and then 6 pm. He felt like brief episodes of faint chest pain, tightness and racing. Pt states he did not take lopressor yesterday for 5 minute event at 4 pm. But at 6 pm he took 1 of his metoprolol. (He took metroplol when his pulse was 97).  Then later after 10 minute his symptoms all resolved.   No leg pain or poplitea pain.  Since b blocker he feels little tired.(pt states he prefers to use vagal maneuvers explained by specialist)  Pt not feeling like any heart burn but states some symptoms after he eats. Pt only drinking one cup of coffee a day.  Pt had ablation end of May. Pt is supposed to see specialist They called him recently and he is due for follow up.  Pt has episodes of above of above about every 2-3 days.   Review of Systems  Constitutional: Positive for fatigue. Negative for chills and unexpected weight change.       After b blocker use.  HENT: Positive for congestion.   Eyes: Negative for photophobia and pain.  Respiratory: Negative for cough, chest tightness, shortness of breath and wheezing.        No symptoms now but see hpi.  Cardiovascular: Negative for chest pain and palpitations.       No symptoms now but see hpi.  Gastrointestinal: Negative for abdominal pain.  Musculoskeletal: Negative for back pain and neck stiffness.  Skin: Negative for rash.  Neurological: Negative for dizziness, weakness, numbness and headaches.  Hematological: Negative for adenopathy. Does not bruise/bleed easily.  Psychiatric/Behavioral: Negative for behavioral problems and hallucinations. The patient is not nervous/anxious.     Past Medical History:  Diagnosis Date  . DDD (degenerative disc disease), lumbar    L3/L4  .  Diverticulosis   . GERD (gastroesophageal reflux disease)   . Hyperlipidemia   . Low sperm motility   . Sleep apnea    uses cpap   . SVT (supraventricular tachycardia) (HCC)      Social History   Social History  . Marital status: Married    Spouse name: N/A  . Number of children: N/A  . Years of education: N/A   Occupational History  . Not on file.   Social History Main Topics  . Smoking status: Former Games developer  . Smokeless tobacco: Never Used     Comment: Uses Hookah Pipe occasionally  . Alcohol use No  . Drug use: No  . Sexual activity: Yes   Other Topics Concern  . Not on file   Social History Narrative   Marital Status: Married Engineer, drilling)   Children:  4 (3 Sons/1 Daughter)   Pets:  None    Living Situation: Lives with spouse and 4 children   Origin:  He was born in [place].   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     Alcohol Use:  None   Drug Use:  None   Diet:  Regular   Exercise:  None   Hobbies:  Cards    Past Surgical History:  Procedure Laterality  Date  . BACK SURGERY    . CARDIAC CATHETERIZATION N/A 12/22/2015   Procedure: Left Heart Cath and Coronary Angiography;  Surgeon: Peter M [place], MD;  Location: [facility] INVASIVE CV LAB;  Service: Cardiovascular;  Laterality: N/A;  . ELECTROPHYSIOLOGIC STUDY N/A 02/19/2016   Procedure: SVT Ablation;  Surgeon: Marinus Maw, MD;  Location: [facility] INVASIVE CV LAB;  Service: Cardiovascular;  Laterality: N/A;  . WISDOM TOOTH EXTRACTION      Family History  Problem Relation Age of Onset  . Hypertension Father     Allergies  Allergen Reactions  . Pork-Derived Products Swelling  . Statins     Muscle aches    Current Outpatient Prescriptions on File Prior to Visit  Medication Sig Dispense Refill  . acetaminophen (TYLENOL) 500 MG tablet Take 1,000-1,500 mg by mouth 2 (two) times daily  as needed (pain).    [name] aspirin 325 MG tablet Take 325 mg by mouth daily.    [name] esomeprazole (NEXIUM) 40 MG capsule Take 1 capsule (40 mg total) by mouth 2 (two) times daily as needed (acid reflux). 60 capsule 11  . fluticasone (FLONASE) 50 MCG/ACT nasal spray Place 2 sprays into both nostrils daily. (Patient taking differently: Place 2 sprays into both nostrils daily as needed. ) 16 g 1  . ibuprofen (ADVIL,MOTRIN) 200 MG tablet Take 400-600 mg by mouth every 6 (six) hours as needed (pain).     . metoprolol tartrate (LOPRESSOR) 25 MG tablet Take 1-4 tablets as directed for racing heart 50 tablet 1  . Multiple Vitamin (MULTIVITAMIN WITH MINERALS) TABS tablet Take 1 tablet by mouth daily. Centrum    . Omega-3 Fatty Acids (FISH OIL PO) Take 1 capsule by mouth daily.    [name] PRESCRIPTION MEDICATION Inhale into the lungs at bedtime. CPAP    . Vitamin D, Ergocalciferol, (DRISDOL) 50000 units CAPS capsule Take 1 capsule (50,000 Units total) by mouth 2 (two) times a week. Mondays and Thursdays 30 capsule 6   No current facility-administered medications on file prior to visit.     BP 127/71 (BP Location: Left Arm, Patient Position: Sitting, Cuff Size: Large)   Pulse 77 Comment: on short walk pulse 77.  Temp 97.9 F (36.6 C) (Oral)   Resp 16   Ht 5\' 10"  (1.778 m)   Wt 250 lb (113.4 kg)   SpO2 99%   BMI 35.87 kg/m       Objective:   Physical Exam   General Mental Status- Alert. General Appearance- Not in acute distress.   Skin General: Color- Normal Color. Moisture- Normal Moisture.  Neck Carotid Arteries- Normal color. Moisture- Normal Moisture. No carotid bruits. No JVD.  Chest and Lung Exam Auscultation: Breath Sounds:-Normal.  Cardiovascular Auscultation:Rythm- Regular( low rate. Then on short walk in office pulse 77) ekg done after resting long time. Murmurs & Other Heart Sounds:Auscultation of the heart reveals- No Murmurs.  Abdomen Inspection:-Inspeection  Normal. Palpation/Percussion:Note:No mass. Palpation and Percussion of the abdomen reveal- Non Tender, Non Distended + BS, no rebound or guarding.    Neurologic Cranial Nerve exam:- CN III-XII intact(No nystagmus), symmetric smile. Strength:- 5/5 equal and symmetric strength both upper and lower extremities.  Lower ext- no pedal edema. Neg homans signs    Assessment & Plan:  336- [id number]  ekg sinus bradycardia. v6 lead did not show. We have machines that unfortunately don't work well(nurse was having to tape down lead and came loose) on Walk in office pulse 77.   For your recent symptoms  of chest pain, tightness with elevated hr we need to get stat labs today. However, if you get recurrent symptoms as before would need to be evaluated in the ED down stairs.  I am going to try to get you in with your cardiologist asap. Hopefully latest Friday this week.  Continue to use metoprolol as you cardiologist advised.  Continue flonase for nasal congestion.  We will call you later today with stat labs. Please get cxr today.  Follow up with cardiologist hopefully later this week. Follow up as regularly scheduled with pcp

## 2016-10-14 ENCOUNTER — Ambulatory Visit: Payer: BLUE CROSS/BLUE SHIELD | Admitting: Physician Assistant

## 2016-10-14 NOTE — Telephone Encounter (Signed)
Yes patient rescheduled appointment until 10/20/16

## 2016-10-20 ENCOUNTER — Ambulatory Visit (INDEPENDENT_AMBULATORY_CARE_PROVIDER_SITE_OTHER): Payer: BLUE CROSS/BLUE SHIELD | Admitting: Internal Medicine

## 2016-10-20 ENCOUNTER — Encounter: Payer: Self-pay | Admitting: Internal Medicine

## 2016-10-20 VITALS — BP 130/86 | HR 78 | Ht 68.0 in | Wt 249.4 lb

## 2016-10-20 DIAGNOSIS — R000 Tachycardia, unspecified: Secondary | ICD-10-CM | POA: Diagnosis not present

## 2016-10-20 DIAGNOSIS — R079 Chest pain, unspecified: Secondary | ICD-10-CM

## 2016-10-20 MED ORDER — METOPROLOL TARTRATE 25 MG PO TABS: 25.0000 mg | ORAL_TABLET | Freq: Two times a day (BID) | ORAL | 3 refills | Status: DC

## 2016-10-20 MED ORDER — FLECAINIDE ACETATE 50 MG PO TABS: ORAL_TABLET | ORAL | 3 refills | Status: DC

## 2016-10-20 NOTE — Patient Instructions (Addendum)
Medication Instructions:  Your physician has recommended you make the following change in your medication:  1) START Flecainide 50 mg as needed for palpitations despite taking Metoprolol (Do not exceed 300 mg daily)  2) Metoprolol 25 mg twice daily  Labwork: None Ordered   Testing/Procedures: None Ordered   Follow-Up: Your physician recommends that you schedule a follow-up appointment in: 3 months with Dr. Ladona Ridgelaylor.    Any Other Special Instructions Will Be Listed Below (If Applicable).     If you need a refill on your cardiac medications before your next appointment, please call your pharmacy.

## 2016-10-20 NOTE — Progress Notes (Signed)
HPI [name] returns today for followup of atrial fib, discovered after successful SVT RFA. He is a pleasant 61 yo man who has otherwise been healthy with a long h/o SVT, who underwent catheter ablation several months ago. 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 I saw him about 2 months ago and he was experiencing palpitations. We have had him wear a heart monitor and he was found to have atrial fib lasting several minutes at a time. Since then he has started taking fish oil and ASA and beta blocker therapy. His CHADSVASC score is zero. He is anxious.  Allergies  Allergen Reactions  . Pork-Derived Products Swelling  . Statins     Muscle aches     Current Outpatient Prescriptions  Medication Sig Dispense Refill  . acetaminophen (TYLENOL) 500 MG tablet Take 1,000-1,500 mg by mouth 2 (two) times daily as needed (pain).    [name]. aspirin 325 MG tablet Take 325 mg by mouth daily.    [name]. esomeprazole (NEXIUM) 40 MG capsule Take 1 capsule (40 mg total) by mouth 2 (two) times daily as needed (acid reflux). 60 capsule 11  . fluticasone (FLONASE) 50 MCG/ACT nasal spray Place 2 sprays into both nostrils daily. (Patient taking differently: Place 2 sprays into both nostrils daily as needed. ) 16 g 1  . ibuprofen (ADVIL,MOTRIN) 200 MG tablet Take 400-600 mg by mouth every 6 (six) hours as needed (pain).     . metoprolol tartrate (LOPRESSOR) 25 MG tablet Take 1-4 tablets as directed for racing heart 50 tablet 1  . Multiple Vitamin (MULTIVITAMIN WITH MINERALS) TABS tablet Take 1 tablet by mouth daily. Centrum    . Omega-3 Fatty Acids (FISH OIL PO) Take 1 capsule by mouth daily.    [name]. PRESCRIPTION MEDICATION Inhale into the lungs at bedtime. CPAP    . Vitamin D, Ergocalciferol, (DRISDOL) 50000 units CAPS capsule Take 1 capsule (50,000 Units total) by mouth 2 (two) times a week. Mondays and Thursdays 30 capsule 6   No current facility-administered medications for  this visit.      Past Medical History:  Diagnosis Date  . DDD (degenerative disc disease), lumbar    L3/L4  . Diverticulosis   . GERD (gastroesophageal reflux disease)   . Hyperlipidemia   . Low sperm motility   . Sleep apnea    uses cpap   . SVT (supraventricular tachycardia) (HCC)     ROS:   All systems reviewed and negative except as noted in the HPI.   Past Surgical History:  Procedure Laterality Date  . BACK SURGERY    . CARDIAC CATHETERIZATION N/A 12/22/2015   Procedure: Left Heart Cath and Coronary Angiography;  Surgeon: Richard M [place]Jordan, MD;  [facility];  Service: Cardiovascular;  Laterality: N/A;  . ELECTROPHYSIOLOGIC STUDY N/A 02/19/2016   Procedure: SVT Ablation;  Surgeon: Richard MawGregg W Kaisyn Millea, MD;  [facility];  Service: Cardiovascular;  Laterality: N/A;  . WISDOM TOOTH EXTRACTION       Family History  Problem Relation Age of Onset  . Hypertension Father      Social History   Social History  . Marital status: Married    Spouse name: N/A  . Number of children: N/A  . Years of education: N/A   Occupational History  . Not on file.   Social History Main Topics  . Smoking status: Former Games developermoker  . Smokeless tobacco: Never  Used     Comment: Uses Hookah Pipe occasionally  . Alcohol use No  . Drug use: No  . Sexual activity: Yes   Other Topics Concern  . Not on file   Social History Narrative   Marital Status: Married Engineer, drilling)   Children:  4 (3 Sons/1 Daughter)   Pets:  None    Living Situation: Lives with spouse and 4 children   Origin:  He was born in [place].   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     Alcohol Use:  None   Drug Use:  None   Diet:  Regular   Exercise:  None   Hobbies:  Cards     BP 130/86   Pulse 78   Ht 5\' 8"  (1.727 m)   Wt 249 lb 6.4 oz (113.1 kg)   SpO2  97%   BMI 37.92 kg/m   Physical Exam:  Well appearing 61 yo man, NAD HEENT: Unremarkable Neck:  7 cm JVD, no thyromegally Lymphatics:  No adenopathy Back:  No CVA tenderness Lungs:  Clear with no wheezes HEART:  Regular rate rhythm, no murmurs, no rubs, no clicks Abd:  soft, positive bowel sounds, no organomegally, no rebound, no guarding Ext:  2 plus pulses, no edema, no cyanosis, no clubbing Skin:  No rashes no nodules Neuro:  CN II through XII intact, motor grossly intact  EKG - nsr with normal axis and intervals.   Assess/Plan: 1. SVT - he is s/p ablation. His episode of palpitations are well controlled. No recurrent SVT 2. Dyslipidemia - he will continue a low fat diet.  3. Dyspnea - he is better. He is encouraged to maintain a low sodium diet. 4. Atrial fib - he is having paroxysms of atrial fib. I have discussed the issues with the patient. He has no indication for systemic anti-coagulation. He will continue full strength ASA. He will take twice daily metoprolol.  Leonia Reeves.D.

## 2016-12-07 ENCOUNTER — Encounter: Payer: Self-pay | Admitting: Internal Medicine

## 2016-12-08 ENCOUNTER — Ambulatory Visit: Payer: BLUE CROSS/BLUE SHIELD | Admitting: Family Medicine

## 2016-12-16 ENCOUNTER — Encounter: Payer: Self-pay | Admitting: Family Medicine

## 2016-12-16 ENCOUNTER — Ambulatory Visit (INDEPENDENT_AMBULATORY_CARE_PROVIDER_SITE_OTHER): Payer: BLUE CROSS/BLUE SHIELD | Admitting: Family Medicine

## 2016-12-16 VITALS — BP 130/82 | HR 63 | Temp 98.1°F | Wt 256.6 lb

## 2016-12-16 DIAGNOSIS — R1011 Right upper quadrant pain: Secondary | ICD-10-CM | POA: Diagnosis not present

## 2016-12-16 DIAGNOSIS — B181 Chronic viral hepatitis B without delta-agent: Secondary | ICD-10-CM

## 2016-12-16 LAB — COMPREHENSIVE METABOLIC PANEL
ALT: 20 U/L (ref 0–53)
AST: 19 U/L (ref 0–37)
Albumin: 4.1 g/dL (ref 3.5–5.2)
Alkaline Phosphatase: 76 U/L (ref 39–117)
BUN: 15 mg/dL (ref 6–23)
CALCIUM: 9.5 mg/dL (ref 8.4–10.5)
CHLORIDE: 103 meq/L (ref 96–112)
CO2: 28 meq/L (ref 19–32)
Creatinine, Ser: 1.03 mg/dL (ref 0.40–1.50)
GFR: 78.16 mL/min (ref >60.00)
Glucose, Bld: 100 mg/dL — ABNORMAL HIGH (ref 70–99)
Potassium: 4 mEq/L (ref 3.5–5.1)
Sodium: 137 mEq/L (ref 135–145)
TOTAL PROTEIN: 6.9 g/dL (ref 6.0–8.3)
Total Bilirubin: 0.5 mg/dL (ref 0.2–1.2)

## 2016-12-16 LAB — LIPASE: Lipase: 33 U/L (ref 11.0–59.0)

## 2016-12-16 NOTE — Patient Instructions (Signed)
I think you may have gallstones.  We will check your liver and pancreas function today, and I will set you up for an ultrasound of your gallbladder tomorrow if at all possible. If you do have gallstones, you will likely need to have your gallbladder removed In the meantime, please seek care if you have worsening pain, fever, or vomiting.  The gallbladder works hardest to digest higher fat foods so try to eat foods that are low in fat for the time being

## 2016-12-16 NOTE — Progress Notes (Signed)
[facility] [address] [place], [place] [address] [phone]  Date:  12/16/2016   Name:  Richard Walker   DOB:  23-Nov-1955   MRN:  [id number]  PCP:  Abbe Amsterdam, MD    Chief Complaint: Abdominal Pain (c/o abdominal pain that radiates to back x 3 weeks. Sometimes worse after eating, depends on the type of food. )   History of Present Illness:  Richard Walker is a 61 y.o. very pleasant male patient who presents with the following:  Seen by myself in May of 2017- in the interim he has seen Ramon Dredge a couple of times for various concerns Saw [name] in February- A/P as follows  1. SVT - he is s/p ablation. His episode of palpitations are well controlled. No recurrent SVT 2. Dyslipidemia - he will continue a low fat diet.  3. Dyspnea - he is better. He is encouraged to maintain a low sodium diet. 4. Atrial fib - he is having paroxysms of atrial fib. I have discussed the issues with the patient. He has no indication for systemic anti-coagulation. He will continue full strength ASA. He will take twice daily metoprolol.  Here today with concern of abd pain-  He has noted a RUQ pain often times after eating.  He has noted this for about 3 weeks.   He feels like he will want to put pressure on the area.  Also sitting down to rest helps No vomiting, no fever or chills The pain sill occur after most meals, he is not sure how long it will last   He does have hep B- he is a carrier.    He has seen GI and been told to just observe for now No bowel changes He has gained a little weight which is disappointing to him His A1c has been at 5.7 for one year  He is originally from [place] He is not aware of any family history of gallbladder issues  Lab Results  Component Value Date   HGBA1C 5.7 07/29/2016     BP Readings from Last 3 Encounters:  12/16/16 130/82  10/20/16 130/86  10/13/16 127/71     Patient Active Problem  List   Diagnosis Date Noted  . SVT (supraventricular tachycardia) (HCC) 04/23/2015  . Abdominal pain, epigastric 06/03/2013  . Nausea alone 06/03/2013  . Diarrhea 06/03/2013  . Unspecified vitamin D deficiency 04/09/2013  . Esophageal reflux 04/09/2013  . Hepatitis B carrier (HCC) 04/09/2013  . Other and unspecified hyperlipidemia 04/09/2013  . Unspecified sleep apnea 04/09/2013    Past Medical History:  Diagnosis Date  . DDD (degenerative disc disease), lumbar    L3/L4  . Diverticulosis   . GERD (gastroesophageal reflux disease)   . Hyperlipidemia   . Low sperm motility   . Sleep apnea    uses cpap   . SVT (supraventricular tachycardia) (HCC)     Past Surgical History:  Procedure Laterality Date  . BACK SURGERY    . CARDIAC CATHETERIZATION N/A 12/22/2015   Procedure: Left Heart Cath and Coronary Angiography;  Surgeon: Peter M [place], MD;  Location: [facility] INVASIVE CV LAB;  Service: Cardiovascular;  Laterality: N/A;  . ELECTROPHYSIOLOGIC STUDY N/A 02/19/2016   Procedure: SVT Ablation;  Surgeon: Marinus Maw, MD;  Location: [facility] INVASIVE CV LAB;  Service: Cardiovascular;  Laterality: N/A;  . WISDOM TOOTH EXTRACTION      Social History  Substance Use Topics  .  Smoking status: Former Games developer  . Smokeless tobacco: Never Used     Comment: Uses Hookah Pipe occasionally  . Alcohol use No    Family History  Problem Relation Age of Onset  . Hypertension Father     Allergies  Allergen Reactions  . Pork-Derived Products Swelling  . Statins     Muscle aches    Medication list has been reviewed and updated.  Current Outpatient Prescriptions on File Prior to Visit  Medication Sig Dispense Refill  . acetaminophen (TYLENOL) 500 MG tablet Take 1,000-1,500 mg by mouth 2 (two) times daily as needed (pain).    [name] esomeprazole (NEXIUM) 40 MG capsule Take 1 capsule (40 mg total) by mouth 2 (two) times daily as needed (acid reflux). 60 capsule 11  . flecainide (TAMBOCOR) 50 MG tablet  Take as needed for palpitations despite taking Metoprolol (do not exceed 300 mg in 24 hours) 45 tablet 3  . metoprolol tartrate (LOPRESSOR) 25 MG tablet Take 1 tablet (25 mg total) by mouth 2 (two) times daily. 60 tablet 3  . Multiple Vitamin (MULTIVITAMIN WITH MINERALS) TABS tablet Take 1 tablet by mouth daily. Centrum    . Omega-3 Fatty Acids (FISH OIL PO) Take 1 capsule by mouth daily.    [name] PRESCRIPTION MEDICATION Inhale into the lungs at bedtime. CPAP     No current facility-administered medications on file prior to visit.     Review of Systems:  As per HPI- otherwise negative.   Physical Examination: Vitals:   12/16/16 1108  BP: 130/82  Pulse: 63  Temp: 98.1 F (36.7 C)   Vitals:   12/16/16 1108  Weight: 256 lb 9.6 oz (116.4 kg)   Body mass index is 39.02 kg/m. Ideal Body Weight:    GEN: WDWN, NAD, Non-toxic, A & O x 3, obese, otherwise looks well HEENT: Atraumatic, Normocephalic. Neck supple. No masses, No LAD.  Bilateral TM wnl, oropharynx normal.  PEERL,EOMI.   Ears and Nose: No external deformity. CV: RRR, No M/G/R. No JVD. No thrill. No extra heart sounds. PULM: CTA B, no wheezes, crackles, rhonchi. No retractions. No resp. distress. No accessory muscle use. ABD: S, NT, ND, +BS. No rebound. No HSM. EXTR: No c/c/e NEURO Normal gait.  PSYCH: Normally interactive. Conversant. Not depressed or anxious appearing.  Calm demeanor.  Belly is benign at this time , negative murphy's   Assessment and Plan: RUQ pain - Plan: Comprehensive metabolic panel, Lipase, US Abdomen Limited RUQ  Hepatitis B carrier (HCC)  Here today with post-prandial RUQ pain suspicious for gallstones Will refer for an [place], get labs today Will plan further follow- up pending labs and [place]  Signed Abbe Amsterdam, MD

## 2016-12-17 ENCOUNTER — Ambulatory Visit (HOSPITAL_BASED_OUTPATIENT_CLINIC_OR_DEPARTMENT_OTHER)
Admission: RE | Admit: 2016-12-17 | Discharge: 2016-12-17 | Disposition: A | Discharge status: Home / Self Care | Payer: BLUE CROSS/BLUE SHIELD | Source: Ambulatory Visit | Attending: Family Medicine | Admitting: Family Medicine

## 2016-12-17 DIAGNOSIS — K760 Fatty (change of) liver, not elsewhere classified: Secondary | ICD-10-CM | POA: Insufficient documentation

## 2016-12-17 DIAGNOSIS — R1011 Right upper quadrant pain: Secondary | ICD-10-CM

## 2016-12-18 ENCOUNTER — Encounter: Payer: Self-pay | Admitting: Family Medicine

## 2016-12-18 ENCOUNTER — Other Ambulatory Visit: Payer: Self-pay | Admitting: Family Medicine

## 2016-12-18 DIAGNOSIS — R1011 Right upper quadrant pain: Secondary | ICD-10-CM

## 2016-12-20 ENCOUNTER — Encounter: Payer: Self-pay | Admitting: Gastroenterology

## 2016-12-21 ENCOUNTER — Ambulatory Visit: Payer: BLUE CROSS/BLUE SHIELD | Admitting: Internal Medicine

## 2016-12-23 ENCOUNTER — Ambulatory Visit: Payer: BLUE CROSS/BLUE SHIELD | Admitting: Internal Medicine

## 2017-01-05 ENCOUNTER — Telehealth: Payer: Self-pay | Admitting: *Deleted

## 2017-01-05 NOTE — Telephone Encounter (Signed)
Patient had labs drawn 12/16/16 cmp and lipase on his last visit. There were no other orders put in or no hepatic function test put in during that visit. Pt scheduled this Friday to have test done.

## 2017-01-05 NOTE — Telephone Encounter (Signed)
-----   Message from Pearline CablesJessica C Copland, MD sent at 01/05/2017  8:24 AM EDT ----- His labs did not come back ----- Message ----- From: SYSTEM Sent: 01/01/2017  12:05 AM To: Pearline CablesJessica C Copland, MD

## 2017-01-06 ENCOUNTER — Other Ambulatory Visit (INDEPENDENT_AMBULATORY_CARE_PROVIDER_SITE_OTHER): Payer: BLUE CROSS/BLUE SHIELD

## 2017-01-06 DIAGNOSIS — R740 Nonspecific elevation of levels of transaminase and lactic acid dehydrogenase [LDH]: Secondary | ICD-10-CM

## 2017-01-06 DIAGNOSIS — R7401 Elevation of levels of liver transaminase levels: Secondary | ICD-10-CM

## 2017-01-07 ENCOUNTER — Other Ambulatory Visit: Payer: BLUE CROSS/BLUE SHIELD

## 2017-01-07 LAB — HEPATIC FUNCTION PANEL
ALK PHOS: 88 U/L (ref 39–117)
ALT: 20 U/L (ref 0–53)
AST: 20 U/L (ref 0–37)
Albumin: 4.4 g/dL (ref 3.5–5.2)
BILIRUBIN DIRECT: 0.1 mg/dL (ref 0.0–0.3)
BILIRUBIN TOTAL: 0.6 mg/dL (ref 0.2–1.2)
Total Protein: 7.5 g/dL (ref 6.0–8.3)

## 2017-01-11 ENCOUNTER — Telehealth: Payer: Self-pay | Admitting: Family Medicine

## 2017-01-11 NOTE — Telephone Encounter (Signed)
Wife called she has questions about bout patients results can call her back at [phone] informed provider will be in tomorrow morning

## 2017-01-13 NOTE — Telephone Encounter (Signed)
Called her back- it looks like his liver function was drawn again un-necessarily.  There was a period of a few days where several of my lab orders were not being "crossed over" to the resulting agency and several patients had to come back for a repeat draw.  Will ask [place] to help have this charge forgiven as it was our mistake. Reassured her that Srinivas's liver function was just fine

## 2017-01-24 ENCOUNTER — Encounter: Payer: Self-pay | Admitting: Gastroenterology

## 2017-01-25 ENCOUNTER — Ambulatory Visit: Payer: BLUE CROSS/BLUE SHIELD | Admitting: Gastroenterology

## 2017-02-28 ENCOUNTER — Other Ambulatory Visit: Payer: Self-pay | Admitting: Emergency Medicine

## 2017-03-02 ENCOUNTER — Other Ambulatory Visit: Payer: Self-pay | Admitting: Emergency Medicine

## 2017-03-02 MED ORDER — ESOMEPRAZOLE MAGNESIUM 40 MG PO CPDR: 40.0000 mg | DELAYED_RELEASE_CAPSULE | Freq: Two times a day (BID) | ORAL | 11 refills | Status: DC | PRN

## 2017-03-11 ENCOUNTER — Ambulatory Visit: Payer: BLUE CROSS/BLUE SHIELD | Admitting: Gastroenterology

## 2017-05-02 ENCOUNTER — Other Ambulatory Visit: Payer: Self-pay | Admitting: Internal Medicine

## 2017-05-17 ENCOUNTER — Ambulatory Visit: Payer: BLUE CROSS/BLUE SHIELD | Admitting: Physician Assistant

## 2017-05-20 ENCOUNTER — Ambulatory Visit (INDEPENDENT_AMBULATORY_CARE_PROVIDER_SITE_OTHER): Payer: BLUE CROSS/BLUE SHIELD | Admitting: Physician Assistant

## 2017-05-20 ENCOUNTER — Encounter: Payer: Self-pay | Admitting: Physician Assistant

## 2017-05-20 VITALS — BP 134/68 | HR 86 | Ht 70.0 in | Wt 242.0 lb

## 2017-05-20 DIAGNOSIS — K219 Gastro-esophageal reflux disease without esophagitis: Secondary | ICD-10-CM

## 2017-05-20 DIAGNOSIS — R1013 Epigastric pain: Secondary | ICD-10-CM

## 2017-05-20 MED ORDER — PANTOPRAZOLE SODIUM 40 MG PO TBEC: 40.0000 mg | DELAYED_RELEASE_TABLET | Freq: Two times a day (BID) | ORAL | 2 refills | Status: DC

## 2017-05-20 NOTE — Patient Instructions (Addendum)
You have been scheduled for an endoscopy. Please follow written instructions given to you at your visit today. If you use inhalers (even only as needed), please bring them with you on the day of your procedure. Your physician has requested that you go to www.startemmi.com and enter the access code given to you at your visit today. This web site gives a general overview about your procedure. However, you should still follow specific instructions given to you by our office regarding your preparation for the procedure.  We have sent the following medications to your pharmacy for you to pick up at your convenience: Pantoprazole 40 mg twice daily (30-60 minutes before breakfast and dinner)  Continue pepcid twice daily  Follow up with Dr Myrtie Neither as per recommendations after endoscopy.  If you are age 39 or older, your body mass index should be between 23-30. Your Body mass index is 34.72 kg/m. If this is out of the aforementioned range listed, please consider follow up with your Primary Care Provider.  If you are age 35 or younger, your body mass index should be between 19-25. Your Body mass index is 34.72 kg/m. If this is out of the aformentioned range listed, please consider follow up with your Primary Care Provider.

## 2017-05-20 NOTE — Progress Notes (Signed)
Chief Complaint: GERD, abdominal pain  HPI:  [name] is a 61 year old male with a past medical history as listed below, who was referred to me by Copland, Gwenlyn Found, MD for a complaint of reflux.      Today, the patient explains that he has had symptoms of shortness of breath and pressure over his heart but has had a full evaluation by the cardiologist and they believe it may be acid reflux. Patient tells me that many times he eats he feels as though something comes up into the back of his mouth and oftentimes he will taste acid. This makes him feel somewhat short of breath and also sometimes give him a pressure over his heart, this is typically worse if he eats late and lays down. Patient notes that he has had reflux for over a year and was on Nexium twice a day but ran out of this prescription about a month ago. Since then he has been using Pepcid which still helps but is "not quite as good". Patient tells me he also decreased his aspirin 81 mg to just once daily dosing as he thought this may be hurting his stomach. Associated symptoms included right upper quadrant abdominal pain which occurs off and on, worse with food.   Patient had a previous colonoscopy about 5 years ago. He is unsure where but was told to repeat in 10 years.   Patient denies fever, chills, blood in the stool, melena, weight loss, anorexia, nausea, vomiting or symptoms that awaken him at night.  Past Medical History:  Diagnosis Date  . DDD (degenerative disc disease), lumbar    L3/L4  . Diverticulosis   . GERD (gastroesophageal reflux disease)   . Hyperlipidemia   . Low sperm motility   . Sleep apnea    uses cpap   . SVT (supraventricular tachycardia) (HCC)     Past Surgical History:  Procedure Laterality Date  . BACK SURGERY    . CARDIAC CATHETERIZATION N/A 12/22/2015   Procedure: Left Heart Cath and Coronary Angiography;  Surgeon: Peter M Swaziland, MD;  Location: [facility] INVASIVE CV LAB;  Service: Cardiovascular;   Laterality: N/A;  . ELECTROPHYSIOLOGIC STUDY N/A 02/19/2016   Procedure: SVT Ablation;  Surgeon: Marinus Maw, MD;  Location: [facility] INVASIVE CV LAB;  Service: Cardiovascular;  Laterality: N/A;  . WISDOM TOOTH EXTRACTION      Current Outpatient Prescriptions  Medication Sig Dispense Refill  . aspirin 81 MG tablet Take 81 mg by mouth daily.    . famotidine-calcium carbonate-magnesium hydroxide (PEPCID COMPLETE) 10-800-165 MG chewable tablet Chew 1 tablet by mouth 2 (two) times daily.    . flecainide (TAMBOCOR) 50 MG tablet Take as needed for palpitations despite taking Metoprolol (do not exceed 300 mg in 24 hours) 45 tablet 3  . metoprolol tartrate (LOPRESSOR) 25 MG tablet TAKE 1 TABLET(25 MG) BY MOUTH TWICE DAILY 60 tablet 11  . PRESCRIPTION MEDICATION Inhale into the lungs at bedtime. CPAP    . pantoprazole (PROTONIX) 40 MG tablet Take 1 tablet (40 mg total) by mouth 2 (two) times daily before a meal. 60 tablet 2   No current facility-administered medications for this visit.     Allergies as of 05/20/2017 - Review Complete 05/20/2017  Allergen Reaction Noted  . Pork-derived products Swelling 12/19/2015  . Statins  01/08/2016    Family History  Problem Relation Age of Onset  . Hypertension Father     Social History   Social History  . Marital  status: Married    Spouse name: N/A  . Number of children: N/A  . Years of education: N/A   Occupational History  . Not on file.   Social History Main Topics  . Smoking status: Former Games developer  . Smokeless tobacco: Never Used     Comment: Uses Hookah Pipe occasionally  . Alcohol use No  . Drug use: No  . Sexual activity: Yes   Other Topics Concern  . Not on file   Social History Narrative   Marital Status: Married Engineer, drilling)   Children:  4 (3 Sons/1 Daughter)   Pets:  None    Living Situation: Lives with spouse and 4 children   Origin:  He was born in [place].   Occupation: Statistician)   Education: Glass blower/designer)   Tobacco Use/Exposure:  Smokes "special" tobacco from his home country ([place]).     Alcohol Use:  None   Drug Use:  None   Diet:  Regular   Exercise:  None   Hobbies:  Cards    Review of Systems:    Constitutional: No weight loss, fever or chills Skin: No rash  Cardiovascular: No chest pain Respiratory: No cough Gastrointestinal: See HPI and otherwise negative Genitourinary: No dysuria  Neurological: No headache Musculoskeletal: No new muscle or joint pain Hematologic: No bleeding Psychiatric: No history of depression or anxiety   Physical Exam:  Vital signs: BP 134/68   Pulse 86   Ht  (1.778 m)   Wt 242 lb (109.8 kg)   BMI 34.72 kg/m   Constitutional:   Pleasant male appears to be in NAD, Well developed, Well nourished, alert and cooperative Head:  Normocephalic and atraumatic. Eyes:   PEERL, EOMI. No icterus. Conjunctiva pink. Ears:  Normal auditory acuity. Neck:  Supple Throat: Oral cavity and pharynx without inflammation, swelling or lesion.  Respiratory: Respirations even and unlabored. Lungs clear to auscultation bilaterally.   No wheezes, crackles, or rhonchi.  Cardiovascular: Normal S1, S2. No MRG. Regular rate and rhythm. No peripheral edema, cyanosis or pallor.  Gastrointestinal:  Soft, nondistended,mild epigastric ttp, No rebound or guarding. Normal bowel sounds. No appreciable masses or hepatomegaly. Rectal:  Not performed.  Msk:  Symmetrical without gross deformities. Without edema, no deformity or joint abnormality.  Neurologic:  Alert and  oriented x4;  grossly normal neurologically.  Skin:   Dry and intact without significant lesions or rashes. Psychiatric: Demonstrates good judgement and reason without abnormal affect or behaviors.  MOST RECENT LABS AND IMAGING: CBC    Component Value Date/Time   WBC 5.6 10/13/2016 1254   RBC 5.41 10/13/2016 1254   HGB 15.3 10/13/2016 1254   HCT 46.0 10/13/2016 1254   PLT 186.0  10/13/2016 1254   MCV 85.1 10/13/2016 1254   MCH 28.7 02/12/2016 1508   MCHC 33.3 10/13/2016 1254   RDW 13.7 10/13/2016 1254   LYMPHSABS 1.9 10/13/2016 1254   MONOABS 0.6 10/13/2016 1254   EOSABS 0.1 10/13/2016 1254   BASOSABS 0.0 10/13/2016 1254    CMP     Component Value Date/Time   NA 137 12/16/2016 1136   K 4.0 12/16/2016 1136   CL 103 12/16/2016 1136   CO2 28 12/16/2016 1136   GLUCOSE 100 (H) 12/16/2016 1136   BUN 15 12/16/2016 1136   CREATININE 1.03 12/16/2016 1136   CREATININE 1.18 02/12/2016 1508   CALCIUM 9.5 12/16/2016 1136   PROT 7.5 01/06/2017 1600   ALBUMIN 4.4 01/06/2017 1600   AST  20 01/06/2017 1600   ALT 20 01/06/2017 1600   ALKPHOS 88 01/06/2017 1600   BILITOT 0.6 01/06/2017 1600   GFRNONAA >60 01/30/2016 0910   GFRNONAA >89 03/04/2014 1456   GFRAA >60 01/30/2016 0910   GFRAA >89 03/04/2014 1456   EXAM: US ABDOMEN LIMITED - RIGHT UPPER QUADRANT 12/17/16  COMPARISON:  07/18/2007  FINDINGS: Gallbladder:  No gallstones or wall thickening visualized. No sonographic Murphy sign noted by sonographer.  Common bile duct:  Diameter: 4 mm  Liver:  Diffuse hepatic increased echogenicity compatible with hepatic steatosis or fatty infiltration. No focal hepatic abnormality. No upper abdominal free fluid. Included views of the right kidney demonstrate no hydronephrosis.  IMPRESSION: Negative for gallstones or biliary dilatation.  Hepatic steatosis  No acute finding by ultrasound.   Electronically Signed   By: Judie Petit.  Shick M.D.   On: 12/17/2016 10:59  Assessment: 1. GERD: For a year, somewhat controlled on Nexium, over the past 2 months patient has had increasing symptoms including chest pressure, evaluation by cardiology suggested reflux, Nexium prescription ran out a month ago, currently on Pepcid with symptoms after eating anything of reflux and a chest pressure/shortness of breath feeling, also describes right upper quadrant  discomfort with eating, previous ultrasound negative; consider most likely reflux plus/minus duodenitis versus other  Plan: 1. Scheduled patient for an EGD in the [facility] with Dr. Myrtie Neither. Did discuss risks, benefits, limitations and alternatives and patient agrees to proceed. 2. Prescribed Pantoprazole 40 mg twice a day, 30-60 minutes before breakfast and dinner. 3. Patient may continue his Pepcid twice a day. 4. Reviewed antireflux diet and lifestyle modifications. 5. Patient to return to clinic per recommendations from Danis after time of procedure.  Hyacinth Meeker, PA-C [facility] 05/20/2017, 11:50 AM  Cc: Pearline Cables, MD

## 2017-05-20 NOTE — Progress Notes (Signed)
Thank you for sending this case to me. I have reviewed the entire note, and the outlined plan seems appropriate.   Ashliegh Parekh Danis, MD  

## 2017-05-24 ENCOUNTER — Encounter: Payer: Self-pay | Admitting: Gastroenterology

## 2017-05-27 ENCOUNTER — Ambulatory Visit: Payer: BLUE CROSS/BLUE SHIELD | Admitting: Physician Assistant

## 2017-06-03 ENCOUNTER — Encounter: Payer: Self-pay | Admitting: Gastroenterology

## 2017-06-03 ENCOUNTER — Ambulatory Visit (AMBULATORY_SURGERY_CENTER): Payer: BLUE CROSS/BLUE SHIELD | Admitting: Gastroenterology

## 2017-06-03 VITALS — BP 114/70 | HR 46 | Temp 98.6°F | Resp 12 | Ht 70.0 in | Wt 242.0 lb

## 2017-06-03 DIAGNOSIS — K219 Gastro-esophageal reflux disease without esophagitis: Secondary | ICD-10-CM | POA: Diagnosis not present

## 2017-06-03 DIAGNOSIS — R0789 Other chest pain: Secondary | ICD-10-CM | POA: Diagnosis not present

## 2017-06-03 MED ORDER — SODIUM CHLORIDE 0.9 % IV SOLN: 500.0000 mL | INTRAVENOUS | Status: DC

## 2017-06-03 NOTE — Progress Notes (Signed)
To recovery, report to RN, VSS. 

## 2017-06-03 NOTE — Op Note (Signed)
[facility] Patient Name: Richard Walker Procedure Date: 06/03/2017 10:42 AM MRN: [id number] Endoscopist: Sherilyn Cooter L. Myrtie Neither , MD Age: 61 Referring MD:  Date of Birth: 06/11/1956 Gender: Male Account #: [id number] Procedure:                Upper GI endoscopy Indications:              Suspected esophageal reflux, Chest pain (non                            cardiac) Medicines:                Monitored Anesthesia Care Procedure:                Pre-Anesthesia Assessment:                           - Prior to the procedure, a History and Physical                            was performed, and patient medications and                            allergies were reviewed. The patient's tolerance of                            previous anesthesia was also reviewed. The risks                            and benefits of the procedure and the sedation                            options and risks were discussed with the patient.                            All questions were answered, and informed consent                            was obtained. Anticoagulants: The patient has taken                            aspirin. It was decided not to withhold this                            medication prior to the procedure. ASA Grade                            Assessment: II - A patient with mild systemic                            disease. After reviewing the risks and benefits,                            the patient was deemed in satisfactory condition to  undergo the procedure.                           After obtaining informed consent, the endoscope was                            passed under direct vision. Throughout the                            procedure, the patient's blood pressure, pulse, and                            oxygen saturations were monitored continuously. The                            Model GIF-[id number]) scope was introduced   through the mouth, and advanced to the second part                            of duodenum. The upper GI endoscopy was                            accomplished without difficulty. The patient                            tolerated the procedure well. Scope In: Scope Out: Findings:                 The larynx was normal.                           The esophagus was normal.                           The stomach was normal.                           The cardia and gastric fundus were normal on                            retroflexion.                           The examined duodenum was normal. Complications:            No immediate complications. Estimated Blood Loss:     Estimated blood loss: none. Impression:               - Normal larynx.                           - Normal esophagus.                           - Normal stomach.                           - Normal examined duodenum.                           -  No specimens collected. Recommendation:           - Patient has a contact number available for                            emergencies. The signs and symptoms of potential                            delayed complications were discussed with the                            patient. Return to normal activities tomorrow.                            Written discharge instructions were provided to the                            patient.                           - Resume previous diet.                           - Discontinue H2 blocker (pepcid) today, but                            continue pantoprazole at current dose.                           - Return to my office at appointment to be                            scheduled.                           - Follow an antireflux regimen indefinitely. This                            is as important as medicine to help control GERD                            symptoms. Saintclair Schroader L. Myrtie Neither, MD 06/03/2017 11:04:11 AM This report has been signed  electronically.

## 2017-06-03 NOTE — Patient Instructions (Signed)
Follow handout given on Antireflux regimen. Stop taking the Pepcid daily but continue the pantoprazole at prior dose.   YOU HAD AN ENDOSCOPIC PROCEDURE TODAY AT THE [facility]:   Refer to the procedure report that was given to you for any specific questions about what was found during the examination.  If the procedure report does not answer your questions, please call your gastroenterologist to clarify.  If you requested that your care partner not be given the details of your procedure findings, then the procedure report has been included in a sealed envelope for you to review at your convenience later.  YOU SHOULD EXPECT: Some feelings of bloating in the abdomen. Passage of more gas than usual.  Walking can help get rid of the air that was put into your GI tract during the procedure and reduce the bloating. If you had a lower endoscopy (such as a colonoscopy or flexible sigmoidoscopy) you may notice spotting of blood in your stool or on the toilet paper. If you underwent a bowel prep for your procedure, you may not have a normal bowel movement for a few days.  Please Note:  You might notice some irritation and congestion in your nose or some drainage.  This is from the oxygen used during your procedure.  There is no need for concern and it should clear up in a day or so.  SYMPTOMS TO REPORT IMMEDIATELY:   Following upper endoscopy (EGD)  Vomiting of blood or coffee ground material  New chest pain or pain under the shoulder blades  Painful or persistently difficult swallowing  New shortness of breath  Fever of 100F or higher  Black, tarry-looking stools  For urgent or emergent issues, a gastroenterologist can be reached at any hour by calling [phone].   DIET:  We do recommend a small meal at first, but then you may proceed to your regular diet.  Drink plenty of fluids but you should avoid alcoholic beverages for 24 hours.  ACTIVITY:  You should plan to take it easy for  the rest of today and you should NOT DRIVE or use heavy machinery until tomorrow (because of the sedation medicines used during the test).    FOLLOW UP: Our staff will call the number listed on your records the next business day following your procedure to check on you and address any questions or concerns that you may have regarding the information given to you following your procedure. If we do not reach you, we will leave a message.  However, if you are feeling well and you are not experiencing any problems, there is no need to return our call.  We will assume that you have returned to your regular daily activities without incident.  If any biopsies were taken you will be contacted by phone or by letter within the next 1-3 weeks.  Please call us at [phone] if you have not heard about the biopsies in 3 weeks.    SIGNATURES/CONFIDENTIALITY: You and/or your care partner have signed paperwork which will be entered into your electronic medical record.  These signatures attest to the fact that that the information above on your After Visit Summary has been reviewed and is understood.  Full responsibility of the confidentiality of this discharge information lies with you and/or your care-partner.  Thank you for letting us take care of your healthcare needs today.

## 2017-06-06 ENCOUNTER — Telehealth: Payer: Self-pay

## 2017-06-06 NOTE — Telephone Encounter (Signed)
Attempted to reach pt. With follow up call following endoscopic procedure 06/03/2017.  LM on pt. Voice mail.  Will try to reach pt. Again later today. 

## 2017-06-06 NOTE — Telephone Encounter (Signed)
  Follow up Call-  Call back number 06/03/2017  Post procedure Call Back phone  # [phone]  Permission to leave phone message Yes  Some recent data might be hidden     Patient questions:  Do you have a fever, pain , or abdominal swelling? No. Pain Score  0 *  Have you tolerated food without any problems? Yes.    Have you been able to return to your normal activities? Yes.    Do you have any questions about your discharge instructions: Diet   No. Medications  No. Follow up visit  No.  Do you have questions or concerns about your Care? No.  Actions: * If pain score is 4 or above: No action needed, pain <4.

## 2017-07-13 ENCOUNTER — Encounter: Payer: Self-pay | Admitting: Medical

## 2017-07-13 ENCOUNTER — Ambulatory Visit (INDEPENDENT_AMBULATORY_CARE_PROVIDER_SITE_OTHER): Payer: Medicaid Other | Admitting: Medical

## 2017-07-13 VITALS — BP 126/81 | HR 66 | Temp 98.0°F | Resp 16 | Wt 253.0 lb

## 2017-07-13 DIAGNOSIS — N529 Male erectile dysfunction, unspecified: Secondary | ICD-10-CM

## 2017-07-13 DIAGNOSIS — J029 Acute pharyngitis, unspecified: Secondary | ICD-10-CM

## 2017-07-13 DIAGNOSIS — R002 Palpitations: Secondary | ICD-10-CM | POA: Diagnosis not present

## 2017-07-13 DIAGNOSIS — R6882 Decreased libido: Secondary | ICD-10-CM | POA: Diagnosis not present

## 2017-07-13 DIAGNOSIS — J3489 Other specified disorders of nose and nasal sinuses: Secondary | ICD-10-CM | POA: Diagnosis not present

## 2017-07-13 DIAGNOSIS — Z125 Encounter for screening for malignant neoplasm of prostate: Secondary | ICD-10-CM

## 2017-07-13 DIAGNOSIS — M545 Low back pain, unspecified: Secondary | ICD-10-CM

## 2017-07-13 LAB — TROPONIN I: TNIDX: 0.01 ug/l (ref 0.00–0.06)

## 2017-07-13 MED ORDER — FLUTICASONE PROPIONATE 50 MCG/ACT NA SUSP: 2.0000 | Freq: Every day | NASAL | 1 refills | Status: DC

## 2017-07-13 MED ORDER — CLINDAMYCIN HCL 150 MG PO CAPS: 150.0000 mg | ORAL_CAPSULE | Freq: Three times a day (TID) | ORAL | 0 refills | Status: DC

## 2017-07-13 MED FILL — CLINDAMYCIN HCL 150 MG CAPS: 150 | 7 days supply | Qty: 21 | Fill #0

## 2017-07-13 MED FILL — FLUTICASONE PROP 50 MCG SPR: 50 | 30 days supply | Qty: 16 | Fill #0

## 2017-07-13 NOTE — Patient Instructions (Addendum)
For recent sore throat/strep rx clindamycin.   For back pain would recommend weight loss. Back stretches. If pain mid line then get lumbar xray. If pain at rest or toward flank then need urine study to check for blood. Curenlty pain seems muscular since on movement.  For ED and decreased libido  get future testosterone.  We will see if cardiologist okay use of Viagra.  For hx of svt. Intermittent tachycardia. Please get back on your metoprolol.   Will get cardiac protein study today stat.  If over long holiday weekend cardiac symptoms then ED evaluation.  Follow up in 7-10 days or as needed   Back Exercises If you have pain in your back, do these exercises 2-3 times each day or as told by your doctor. When the pain goes away, do the exercises once each day, but repeat the steps more times for each exercise (do more repetitions). If you do not have pain in your back, do these exercises once each day or as told by your doctor. Exercises Single Knee to Chest  Do these steps 3-5 times in a row for each leg: 1. Lie on your back on a firm bed or the floor with your legs stretched out. 2. Bring one knee to your chest. 3. Hold your knee to your chest by grabbing your knee or thigh. 4. Pull on your knee until you feel a gentle stretch in your lower back. 5. Keep doing the stretch for 10-30 seconds. 6. Slowly let go of your leg and straighten it.  Pelvic Tilt  Do these steps 5-10 times in a row: 1. Lie on your back on a firm bed or the floor with your legs stretched out. 2. Bend your knees so they point up to the ceiling. Your feet should be flat on the floor. 3. Tighten your lower belly (abdomen) muscles to press your lower back against the floor. This will make your tailbone point up to the ceiling instead of pointing down to your feet or the floor. 4. Stay in this position for 5-10 seconds while you gently tighten your muscles and breathe evenly.  Cat-Cow  Do these steps until your  lower back bends more easily: 1. Get on your hands and knees on a firm surface. Keep your hands under your shoulders, and keep your knees under your hips. You may put padding under your knees. 2. Let your head hang down, and make your tailbone point down to the floor so your lower back is round like the back of a cat. 3. Stay in this position for 5 seconds. 4. Slowly lift your head and make your tailbone point up to the ceiling so your back hangs low (sags) like the back of a cow. 5. Stay in this position for 5 seconds.  Press-Ups  Do these steps 5-10 times in a row: 1. Lie on your belly (face-down) on the floor. 2. Place your hands near your head, about shoulder-width apart. 3. While you keep your back relaxed and keep your hips on the floor, slowly straighten your arms to raise the top half of your body and lift your shoulders. Do not use your back muscles. To make yourself more comfortable, you may change where you place your hands. 4. Stay in this position for 5 seconds. 5. Slowly return to lying flat on the floor.  Bridges  Do these steps 10 times in a row: 1. Lie on your back on a firm surface. 2. Bend your knees so they  point up to the ceiling. Your feet should be flat on the floor. 3. Tighten your butt muscles and lift your butt off of the floor until your waist is almost as high as your knees. If you do not feel the muscles working in your butt and the back of your thighs, slide your feet 1-2 inches farther away from your butt. 4. Stay in this position for 3-5 seconds. 5. Slowly lower your butt to the floor, and let your butt muscles relax.  If this exercise is too easy, try doing it with your arms crossed over your chest. Belly Crunches  Do these steps 5-10 times in a row: 1. Lie on your back on a firm bed or the floor with your legs stretched out. 2. Bend your knees so they point up to the ceiling. Your feet should be flat on the floor. 3. Cross your arms over your  chest. 4. Tip your chin a little bit toward your chest but do not bend your neck. 5. Tighten your belly muscles and slowly raise your chest just enough to lift your shoulder blades a tiny bit off of the floor. 6. Slowly lower your chest and your head to the floor.  Back Lifts Do these steps 5-10 times in a row: 1. Lie on your belly (face-down) with your arms at your sides, and rest your forehead on the floor. 2. Tighten the muscles in your legs and your butt. 3. Slowly lift your chest off of the floor while you keep your hips on the floor. Keep the back of your head in line with the curve in your back. Look at the floor while you do this. 4. Stay in this position for 3-5 seconds. 5. Slowly lower your chest and your face to the floor.  Contact a doctor if:  Your back pain gets a lot worse when you do an exercise.  Your back pain does not lessen 2 hours after you exercise. If you have any of these problems, stop doing the exercises. Do not do them again unless your doctor says it is okay. Get help right away if:  You have sudden, very bad back pain. If this happens, stop doing the exercises. Do not do them again unless your doctor says it is okay. This information is not intended to replace advice given to you by your health care provider. Make sure you discuss any questions you have with your health care provider. Document Released: 09/11/2010 Document Revised: 01/15/2016 Document Reviewed: 10/03/2014 Elsevier Interactive Patient Education  [employer]2018 [employer]

## 2017-07-13 NOTE — Progress Notes (Signed)
Subjective:    Patient ID: Richard Walker, male    DOB: 1955-12-20, 61 y.o.   MRN: [id number]  HPI  Pt in for evaluation.  Pt states he has had sore throat. 2 children had st recently.  He went to [facility] and was given amoxicillin despite full coarse. He still has sorethroat. He feels no fever or chills. No myalgias.   Pt states recent  unemployment since October. Bilateral lower back pain.  He thinks maybe from weight gain. Pain only on changing positions. At rest no pain. No abdomen pain.   Pt also states some ED, just recently this has been the case. Pt has been on metoprolol for a while. Pt has history of svt. He had ablation procedure in the past. Pt will see cardiologist on July 27, 2017. Pt in the past was using metoprolol twice a day. Pt is on flecainide. He stopped metoprolol recently since he thought it might be causing his erectile dysfunction.  He notes that occasionally he will feel his heartbeat fast intermittently and that it will seem to be normal quickly.  Pt states cardiologist thinks ablation procedure was successful.  But on further discussion it sounds like this is yet to be determined.  Patient does not report any chest pain, jaw pain, arm pain or shortness of breath.  During the interview patient thought he felt a fast heart rate.  I did auscultate at that time and he did sound fast.  Then gradually listening his heart return to normal rate.  During the interview due to EKG and the EKG appeared to show normal sinus rhythm.       Review of Systems  Constitutional: Negative for chills, fatigue and fever.  HENT: Positive for sore throat. Negative for congestion, drooling and ear pain.   Respiratory: Negative for cough, chest tightness, shortness of breath and wheezing.   Cardiovascular: Negative for chest pain and palpitations.       After eats he wonders if heart beast fast or pressure. No symptoms presently.  Pt had egd done.  See HPI sensation of  palpitation/tachycardia.  Brief about 10-15 seconds  Gastrointestinal: Negative for abdominal distention, abdominal pain, constipation, diarrhea, nausea and vomiting.  Musculoskeletal: Positive for back pain. Negative for arthralgias, joint swelling, myalgias and neck stiffness.       Bilateral para lumbar area pain.  Neurological: Negative for dizziness, weakness, numbness and headaches.  Hematological: Positive for adenopathy. Does not bruise/bleed easily.  Psychiatric/Behavioral: Negative for behavioral problems, confusion and hallucinations.       Objective:   Physical Exam  General  Mental Status - Alert. General Appearance - Well groomed. Not in acute distress.  Skin Rashes- No Rashes.  HEENT Head- Normal. Ear Auditory Canal - Left- Normal. Right - Normal.Tympanic Membrane- Left- Normal. Right- Normal. Eye Sclera/Conjunctiva- Left- Normal. Right- Normal. Nose & Sinuses Nasal Mucosa- Left-  Boggy and Congested. Right-  Boggy and  Congested.Bilateral  No maxillary but  frontal sinus pressure. Mouth & Throat Lips: Upper Lip- Normal: no dryness, cracking, pallor, cyanosis, or vesicular eruption. Lower Lip-Normal: no dryness, cracking, pallor, cyanosis or vesicular eruption. Buccal Mucosa- Bilateral- No Aphthous ulcers. Oropharynx- No Discharge or Erythema. Tonsils: Characteristics- Bilateral- bright Erythema or Congestion. Size/Enlargement- Bilateral- No enlargement. Discharge- bilateral-None.  Neck Neck- Supple. No Masses. Submandibular nodes mildly enlarged.   Chest and Lung Exam Auscultation: Breath Sounds:-Clear even and unlabored.  Cardiovascular Auscultation:Rythm- Regular, rate and rhythm. Murmurs & Other Heart Sounds:Ausculatation of the heart reveal- No  Murmurs.  Lymphatic Head & Neck General Head & Neck Lymphatics: Bilateral: Description- No Localized lymphadenopathy.       Assessment & Plan:  For recent sore throat/strep rx clindamycin.  Strep test  was positive.  Appears he had amoxicillin failure so I prescribed clindamycin.  For back pain would recommend weight loss. Back stretches. If pain mid line then get lumbar xray. If pain at rest or toward flank then need urine study to check for blood. Curenlty pain seems muscular since on movement.  For ED and decreased libido  get future testosterone.  We will see if cardiologist okay with the use of Viagra  For hx of svt. Intermittent tachycardia. Please get back on your metoprolol.   Will get cardiac protein study today stat.  If over long holiday weekend cardiac symptoms then ED evaluation.  Follow up in 7-10 days or as needed  Richard Walker, Richard Walker, [name]

## 2017-07-18 ENCOUNTER — Other Ambulatory Visit (INDEPENDENT_AMBULATORY_CARE_PROVIDER_SITE_OTHER): Payer: Medicaid Other

## 2017-07-18 ENCOUNTER — Telehealth: Payer: Self-pay | Admitting: Family Medicine

## 2017-07-18 DIAGNOSIS — R6882 Decreased libido: Secondary | ICD-10-CM

## 2017-07-18 DIAGNOSIS — N529 Male erectile dysfunction, unspecified: Secondary | ICD-10-CM

## 2017-07-18 DIAGNOSIS — Z125 Encounter for screening for malignant neoplasm of prostate: Secondary | ICD-10-CM | POA: Diagnosis not present

## 2017-07-18 DIAGNOSIS — R739 Hyperglycemia, unspecified: Secondary | ICD-10-CM | POA: Diagnosis not present

## 2017-07-18 LAB — HEMOGLOBIN A1C: Hgb A1c MFr Bld: 5.8 % (ref 4.6–6.5)

## 2017-07-18 LAB — PSA: PSA: 1.11 ng/mL (ref 0.10–4.00)

## 2017-07-18 NOTE — Telephone Encounter (Signed)
-----   [facility] sent at 07/18/2017  7:43 AM EST ----- Contact: [phone] Pt wants to know if ok with providers to be [facility], if ok with providers. Please advise

## 2017-07-18 NOTE — Telephone Encounter (Signed)
Ok with switch  

## 2017-07-18 NOTE — Telephone Encounter (Signed)
Ok with me 

## 2017-07-19 LAB — TESTOSTERONE TOTAL,FREE,BIO, MALES
ALBUMIN MSPROF: 4 g/dL (ref 3.6–5.1)
SEX HORMONE BINDING: 17 nmol/L — AB (ref 22–77)
TESTOSTERONE FREE: 66.2 pg/mL (ref 46.0–224.0)
TESTOSTERONE: 305 ng/dL (ref 250–827)
Testosterone, Bioavailable: 121.8 ng/dL (ref >=110.0)

## 2017-07-27 ENCOUNTER — Encounter: Payer: Self-pay | Admitting: Internal Medicine

## 2017-07-27 ENCOUNTER — Ambulatory Visit: Payer: Medicaid Other | Admitting: Internal Medicine

## 2017-07-27 VITALS — BP 116/80 | HR 63 | Ht 70.0 in | Wt 249.4 lb

## 2017-07-27 DIAGNOSIS — I471 Supraventricular tachycardia, unspecified: Secondary | ICD-10-CM

## 2017-07-27 MED ORDER — FLECAINIDE ACETATE 50 MG PO TABS: ORAL_TABLET | ORAL | 3 refills | Status: DC

## 2017-07-27 MED ORDER — SILDENAFIL CITRATE 20 MG PO TABS: 20.0000 mg | ORAL_TABLET | ORAL | 1 refills | Status: DC | PRN

## 2017-07-27 MED ORDER — METOPROLOL TARTRATE 25 MG PO TABS: 25.0000 mg | ORAL_TABLET | Freq: Two times a day (BID) | ORAL | 3 refills | Status: DC

## 2017-07-27 NOTE — Progress Notes (Signed)
HPI [name] returns today for ongoing evaluation and management of atrial fibrillation and hypertension.  He is a very pleasant 61 year old man with a history of documented SVT, who underwent EP study and catheter ablation over a year ago.  At that time he had inducible AV node reentrant tachycardia and underwent successful slow pathway ablation.  Post procedure, he developed recurrent palpitations and was found to have paroxysmal atrial fibrillation.  In the interim, he has brief palpitations, occurring several times a week.  These typically last less than an hour.  In addition, he has had problems with erectile dysfunction.  He wants to know whether or not he can take Viagra. Allergies  Allergen Reactions  . Pork-Derived Products Swelling  . Statins     Muscle aches     Current Outpatient Medications  Medication Sig Dispense Refill  . aspirin 81 MG tablet Take 81 mg by mouth daily.    . flecainide (TAMBOCOR) 50 MG tablet Take as needed for palpitations despite taking Metoprolol (do not exceed 300 mg in 24 hours) 45 tablet 3  . metoprolol tartrate (LOPRESSOR) 25 MG tablet TAKE 1 TABLET(25 MG) BY MOUTH TWICE DAILY 60 tablet 11  . pantoprazole (PROTONIX) 40 MG tablet Take 1 tablet (40 mg total) by mouth 2 (two) times daily before a meal. 60 tablet 2  . PRESCRIPTION MEDICATION Inhale into the lungs at bedtime. CPAP     No current facility-administered medications for this visit.      Past Medical History:  Diagnosis Date  . DDD (degenerative disc disease), lumbar    L3/L4  . Diverticulosis   . GERD (gastroesophageal reflux disease)   . Hyperlipidemia   . Low sperm motility   . Sleep apnea    uses cpap   . SVT (supraventricular tachycardia) (HCC)     ROS:   All systems reviewed and negative except as noted in the HPI.   Past Surgical History:  Procedure Laterality Date  . BACK SURGERY    . CARDIAC CATHETERIZATION N/A 12/22/2015   Procedure: Left Heart Cath and  Coronary Angiography;  Surgeon: Richard M SwazilandJordan, MD;  [facility];  Service: Cardiovascular;  Laterality: N/A;  . ELECTROPHYSIOLOGIC STUDY N/A 02/19/2016   Procedure: SVT Ablation;  Surgeon: Richard MawGregg W Monasia Lair, MD;  [facility];  Service: Cardiovascular;  Laterality: N/A;  . WISDOM TOOTH EXTRACTION       Family History  Problem Relation Age of Onset  . Hypertension Father      Social History   Socioeconomic History  . Marital status: Married    Spouse name: Not on file  . Number of children: Not on file  . Years of education: Not on file  . Highest education level: Not on file  Social Needs  . Financial resource strain: Not on file  . Food insecurity - worry: Not on file  . Food insecurity - inability: Not on file  . Transportation needs - medical: Not on file  . Transportation needs - non-medical: Not on file  Occupational History  . Not on file  Tobacco Use  . Smoking status: Former Games developermoker  . Smokeless tobacco: Never Used  . Tobacco comment: Uses Hookah Pipe occasionally  Substance and Sexual Activity  . Alcohol use: No  . Drug use: No  . Sexual activity: Yes  Other Topics Concern  . 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     Alcohol Use:  None   Drug Use:  None   Diet:  Regular   Exercise:  None   Hobbies:  Cards     BP 116/80   Pulse 63   Ht 5\' 10"  (1.778 m)   Wt 249 lb 6.4 oz (113.1 kg)   SpO2 96%   BMI 35.79 kg/m   Physical Exam:  Well appearing 61 year old man, NAD HEENT: Unremarkable Neck: 6 cm JVD, no thyromegally Lymphatics:  No adenopathy Back:  No CVA  tenderness Lungs:  Clear, with no wheezes, rales, or rhonchi HEART:  Regular rate rhythm, no murmurs, no rubs, no clicks Abd:  soft, positive bowel sounds, no organomegally, no rebound, no guarding Ext:  2 plus pulses, no edema, no cyanosis, no clubbing Skin:  No rashes no nodules Neuro:  CN II through XII intact, motor grossly intact  EKG -normal sinus rhythm normal axis and intervals  Assess/Plan: 1.  SVT -he is status post catheter ablation and is doing well.  He will continue his current medical therapy. 2.  Paroxysmal atrial fibrillation -he is having brief episodes several times a week.  I have encouraged the patient to take his flecainide twice daily.  Previously he had been taking it on an as-needed basis. 3.  Erectile dysfunction -a prescription for sildenafil has been prescribed. 4.  Hypertension -his blood pressure is well controlled.  He may well not even have hypertension. 5.  Obesity -he is encouraged to exercise and lose weight.  He is encouraged to eat less.  Richard KuGreg Lalla Laham, MD

## 2017-07-27 NOTE — Patient Instructions (Addendum)
Medication Instructions:  Your physician has recommended you make the following change in your medication:  1.  You may take sildanefil 20 mg (1 to 2 tablets) by mouth as needed.  Do not take more than 2 tablets in one day.  Labwork: None ordered.  Testing/Procedures: None ordered.  Follow-Up: Your physician wants you to follow-up in: 6 months with Dr. Ladona Ridgelaylor.  You will receive a reminder letter in the mail two months in advance. If you don't receive a letter, please call our office to schedule the follow-up appointment.   Any Other Special Instructions Will Be Listed Below (If Applicable).   If you need a refill on your cardiac medications before your next appointment, please call your pharmacy.

## 2017-07-28 ENCOUNTER — Encounter: Payer: Self-pay | Admitting: Gastroenterology

## 2017-07-28 ENCOUNTER — Ambulatory Visit: Payer: Medicaid Other | Admitting: Gastroenterology

## 2017-07-28 VITALS — BP 114/78 | HR 54 | Ht 69.0 in | Wt 253.0 lb

## 2017-07-28 DIAGNOSIS — R140 Abdominal distension (gaseous): Secondary | ICD-10-CM

## 2017-07-28 DIAGNOSIS — K219 Gastro-esophageal reflux disease without esophagitis: Secondary | ICD-10-CM | POA: Diagnosis not present

## 2017-07-28 DIAGNOSIS — R0789 Other chest pain: Secondary | ICD-10-CM

## 2017-07-28 NOTE — Patient Instructions (Signed)
If you are age 61 or older, your body mass index should be between 23-30. Your Body mass index is 37.36 kg/m. If this is out of the aforementioned range listed, please consider follow up with your Primary Care Provider.  If you are age 61 or younger, your body mass index should be between 19-25. Your Body mass index is 37.36 kg/m. If this is out of the aformentioned range listed, please consider follow up with your Primary Care Provider.   Thank you for choosing [facility]  Dr Amada JupiterHenry Danis III

## 2017-07-28 NOTE — Progress Notes (Signed)
[facility] Progress Note  Chief Complaint: Upper abdominal bloating and GERD  Subjective  History:  Richard Walker sees me for his chest pressure epigastric pain and bloating.  He has occasional reflux symptoms, but has not noticed much improvement on PPI.  He really does not have much in the way of heartburn, he has occasional regurgitation.  His main symptom is a feeling of pressure and distention in the upper abdomen within 15-30 minutes after eating.  There is no vomiting or weight loss.  Recent upper endoscopy was normal.  He has a close follow-up with his cardiologist because there are associated palpitations with these symptoms, especially if he turns to one side of the other while sleeping. He admits there is been increased stress recently, and he lost his job about a month ago.  ROS: Cardiovascular:  no chest pain Respiratory: no dyspnea He has had about 25 pound weight gain in the last year.  The patient's Past Medical, Family and Social History were reviewed and are on file in the EMR.  Objective:  Med list reviewed  Current Outpatient Medications:  .  aspirin 81 MG tablet, Take 81 mg by mouth daily., Disp: , Rfl:  .  flecainide (TAMBOCOR) 50 MG tablet, Take as needed for palpitations despite taking Metoprolol (do not exceed 300 mg in 24 hours), Disp: 45 tablet, Rfl: 3 .  metoprolol tartrate (LOPRESSOR) 25 MG tablet, Take 1 tablet (25 mg total) by mouth 2 (two) times daily., Disp: 180 tablet, Rfl: 3 .  pantoprazole (PROTONIX) 40 MG tablet, Take 1 tablet (40 mg total) by mouth 2 (two) times daily before a meal., Disp: 60 tablet, Rfl: 2 .  PRESCRIPTION MEDICATION, Inhale into the lungs at bedtime. CPAP, Disp: , Rfl:  .  sildenafil (REVATIO) 20 MG tablet, Take 1-2 tablets (20-40 mg total) by mouth as needed., Disp: 60 tablet, Rfl: 1   Vital signs in last 24 hrs: Vitals:   07/28/17 1114  BP: 114/78  Pulse: (!) 54    Physical Exam    HEENT: sclera anicteric, oral  mucosa moist without lesions  Neck: supple, no thyromegaly, JVD or lymphadenopathy  Cardiac: RRR without murmurs, S1S2 heard, no peripheral edema  Pulm: clear to auscultation bilaterally, normal RR and effort noted  Abdomen: soft, no tenderness, with active bowel sounds. No guarding or palpable hepatosplenomegaly.  Skin; warm and dry, no jaundice or rash    @ASSESSMENTPLANBEGIN @ Assessment: Encounter Diagnoses  Name Primary?  . Gastroesophageal reflux disease, esophagitis presence not specified Yes  . Non-cardiac chest pain   . 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  I reassured him that workup to date has been unrevealing.  He has had no weight loss, loss of appetite vomiting or other red flag symptoms.  I do not think he has gastroparesis.  It sounds like there may be some functional overlay to this with some anxiety exacerbating it.  Through our discussion today I explained to him what I think is going on and was able to providing reassurance.  As the PPI does not seem to be helping much, I have asked him to wean off it over the next couple of weeks and see if there is any change.  I do not think further GI testing is likely to be revealing at this point, he will see me as needed.  He was reassured, and will follow closely with primary care and cardiology.  Total time  20 minutes face-to-face, all spent in counseling and coordination of care.   Charlie PitterHenry L Danis III

## 2017-08-29 ENCOUNTER — Ambulatory Visit (INDEPENDENT_AMBULATORY_CARE_PROVIDER_SITE_OTHER): Payer: Medicaid Other | Admitting: Medical

## 2017-08-29 ENCOUNTER — Encounter: Payer: Self-pay | Admitting: Medical

## 2017-08-29 ENCOUNTER — Ambulatory Visit (HOSPITAL_BASED_OUTPATIENT_CLINIC_OR_DEPARTMENT_OTHER)
Admission: RE | Admit: 2017-08-29 | Discharge: 2017-08-29 | Disposition: A | Discharge status: Home / Self Care | Payer: Medicaid Other | Source: Ambulatory Visit | Attending: Medical | Admitting: Medical

## 2017-08-29 VITALS — BP 140/86 | HR 58 | Temp 98.2°F | Resp 16 | Ht 69.0 in | Wt 257.8 lb

## 2017-08-29 DIAGNOSIS — R0981 Nasal congestion: Secondary | ICD-10-CM | POA: Diagnosis not present

## 2017-08-29 DIAGNOSIS — J029 Acute pharyngitis, unspecified: Secondary | ICD-10-CM

## 2017-08-29 DIAGNOSIS — R062 Wheezing: Secondary | ICD-10-CM | POA: Diagnosis not present

## 2017-08-29 DIAGNOSIS — R05 Cough: Secondary | ICD-10-CM | POA: Diagnosis present

## 2017-08-29 DIAGNOSIS — J984 Other disorders of lung: Secondary | ICD-10-CM | POA: Insufficient documentation

## 2017-08-29 DIAGNOSIS — J40 Bronchitis, not specified as acute or chronic: Secondary | ICD-10-CM | POA: Diagnosis not present

## 2017-08-29 DIAGNOSIS — R918 Other nonspecific abnormal finding of lung field: Secondary | ICD-10-CM | POA: Diagnosis not present

## 2017-08-29 DIAGNOSIS — R059 Cough, unspecified: Secondary | ICD-10-CM

## 2017-08-29 LAB — POCT RAPID STREP A (OFFICE): Rapid Strep A Screen: NEGATIVE

## 2017-08-29 MED ORDER — FLUTICASONE PROPIONATE 50 MCG/ACT NA SUSP
2.0000 | Freq: Every day | NASAL | 1 refills | Status: DC
Start: 2017-08-29 — End: 2017-09-07

## 2017-08-29 MED ORDER — DOXYCYCLINE HYCLATE 100 MG PO TABS: 100.0000 mg | ORAL_TABLET | Freq: Two times a day (BID) | ORAL | 0 refills | Status: DC

## 2017-08-29 MED ORDER — BENZONATATE 100 MG PO CAPS: 100.0000 mg | ORAL_CAPSULE | Freq: Three times a day (TID) | ORAL | 0 refills | Status: DC | PRN

## 2017-08-29 MED ORDER — PREDNISONE 10 MG PO TABS: ORAL_TABLET | ORAL | 0 refills | Status: DC

## 2017-08-29 MED FILL — FLUTICASONE PROP 50 MCG SPR: 50 | 30 days supply | Qty: 16 | Fill #0

## 2017-08-29 MED FILL — BENZONATATE 100 MG CAPSULE: 100 | 7 days supply | Qty: 21 | Fill #0

## 2017-08-29 MED FILL — DOXYCYCLINE HYCLATE 100 MG: 100 | 10 days supply | Qty: 20 | Fill #0

## 2017-08-29 MED FILL — predniSONE 10 MG TABS: 10 | 4 days supply | Qty: 10 | Fill #0

## 2017-08-29 NOTE — Progress Notes (Signed)
Subjective:    Patient ID: Richard Walker, male    DOB: 25-Jul-1956, 62 y.o.   MRN: [id number]  HPI  Pt in follow up.  He gives me update on his cardiologist and his GI visit. I reviewed those notes as well.  Also states he never had to use viagra. Also around that time of erectile dysfunction he was under a lot of stress related to unemployment and his dad passing away.   Pt does have 2-3 days of nasal congestion, productive cough and little wheezing. Also when he coughs he brings up colored mucus.   He feels little tired at time. No body aches, no fever, no chills or sweats.   Pt states on climbing steps both knees and some rt si area pain. This is going on for months and he mentions on end of above discussion.   Former pack a day smoker for 15 years.    Review of Systems  Constitutional: Negative for chills, fatigue and fever.  HENT: Positive for congestion and sore throat. Negative for drooling, postnasal drip, sinus pressure, sinus pain, trouble swallowing and voice change.        Slight st.  Respiratory: Positive for cough and wheezing. Negative for shortness of breath.        Minimal wheeze on and off.  Pt states can feel and hear. More at night.  Cardiovascular: Negative for chest pain and palpitations.  Gastrointestinal: Negative for abdominal distention, abdominal pain, blood in stool, nausea and vomiting.  Musculoskeletal: Negative for back pain.  Skin: Negative for rash.  Neurological: Negative for dizziness, syncope, weakness, numbness and headaches.  Hematological: Negative for adenopathy. Does not bruise/bleed easily.  Psychiatric/Behavioral: Negative for behavioral problems and confusion.    Past Medical History:  Diagnosis Date  . DDD (degenerative disc disease), lumbar    L3/L4  . Diverticulosis   . GERD (gastroesophageal reflux disease)   . Hyperlipidemia   . Low sperm motility   . Sleep apnea    uses cpap   . SVT (supraventricular tachycardia)  (HCC)      Social History   Socioeconomic History  . Marital status: Married    Spouse name: Not on file  . Number of children: Not on file  . Years of education: Not on file  . Highest education level: Not on file  Social Needs  . Financial resource strain: Not on file  . Food insecurity - worry: Not on file  . Food insecurity - inability: Not on file  . Transportation needs - medical: Not on file  . Transportation needs - non-medical: Not on file  Occupational History  . Not on file  Tobacco Use  . Smoking status: Former Games developer  . Smokeless tobacco: Never Used  . Tobacco comment: Uses Hookah Pipe occasionally  Substance and Sexual Activity  . Alcohol use: No  . Drug use: No  . Sexual activity: Yes  Other Topics Concern  . Not on file  Social History Narrative   Marital Status: Married Engineer, drilling)   Children:  4 (3 Sons/1 Daughter)   Pets:  None    Living Situation: Lives with spouse and 4 children   Origin:  He was born in [place].   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     Alcohol Use:  None   Drug Use:  None   Diet:  Regular   Exercise:  None   Hobbies:  Cards    Past Surgical History:  Procedure Laterality Date  . BACK SURGERY    . CARDIAC CATHETERIZATION N/A 12/22/2015   Procedure: Left Heart Cath and Coronary Angiography;  Surgeon: Peter M Swaziland, MD;  [facility] CV LAB;  Service: Cardiovascular;  Laterality: N/A;  . ELECTROPHYSIOLOGIC STUDY N/A 02/19/2016   Procedure: SVT Ablation;  Surgeon: Marinus Maw, MD;  Location: [facility] INVASIVE CV LAB;  Service: Cardiovascular;  Laterality: N/A;  . WISDOM TOOTH EXTRACTION      Family History  Problem Relation Age of Onset  . Hypertension Father   . Colon cancer Neg Hx   . Stomach cancer Neg Hx     Allergies  Allergen Reactions  . Pork-Derived Products Swelling  .  Statins     Muscle aches    Current Outpatient Medications on File Prior to Visit  Medication Sig Dispense Refill  . aspirin 81 MG tablet Take 81 mg by mouth daily.    . flecainide (TAMBOCOR) 50 MG tablet Take as needed for palpitations despite taking Metoprolol (do not exceed 300 mg in 24 hours) 45 tablet 3  . metoprolol tartrate (LOPRESSOR) 25 MG tablet Take 1 tablet (25 mg total) by mouth 2 (two) times daily. 180 tablet 3  . pantoprazole (PROTONIX) 40 MG tablet Take 1 tablet (40 mg total) by mouth 2 (two) times daily before a meal. 60 tablet 2  . PRESCRIPTION MEDICATION Inhale into the lungs at bedtime. CPAP    . sildenafil (REVATIO) 20 MG tablet Take 1-2 tablets (20-40 mg total) by mouth as needed. 60 tablet 1   No current facility-administered medications on file prior to visit.     BP 140/86   Pulse (!) 58   Temp 98.2 F (36.8 C) (Oral)   Resp 16   Ht 5\' 9"  (1.753 m)   Wt 257 lb 12.8 oz (116.9 kg)   SpO2 99%   BMI 38.07 kg/m       Objective:   Physical Exam   General  Mental Status - Alert. General Appearance - Well groomed. Not in acute distress.  Skin Rashes- No Rashes.  HEENT Head- Normal. Ear Auditory Canal - Left- Normal. Right - Normal.Tympanic Membrane- Left- Normal. Right- Normal. Eye Sclera/Conjunctiva- Left- Normal. Right- Normal. Nose & Sinuses Nasal Mucosa- Left-  Boggy and Congested. Right-  Boggy and  Congested.Bilateral no maxillary and  No frontal sinus pressure. Mouth & Throat Lips: Upper Lip- Normal: no dryness, cracking, pallor, cyanosis, or vesicular eruption. Lower Lip-Normal: no dryness, cracking, pallor, cyanosis or vesicular eruption. Buccal Mucosa- Bilateral- No Aphthous ulcers. Oropharynx- No Discharge or Erythema. Tonsils: Characteristics- Bilateral- No Erythema or Congestion. Size/Enlargement- Bilateral- No enlargement. Discharge- bilateral-None.  Neck Neck- Supple. No Masses.   Chest and Lung Exam Auscultation: Breath  Sounds:- even and unlabored. Clear.  Cardiovascular Auscultation:Rythm- Regular, rate and rhythm. Murmurs & Other Heart Sounds:Ausculatation of the heart reveal- No Murmurs.  Lymphatic Head & Neck General Head & Neck Lymphatics: Bilateral: Description- No Localized lymphadenopathy.      Assessment & Plan:  You appear to have bronchitis. Rest hydrate and tylenol for fever. I am prescribing cough medicine benzonatate, and doxycycline antibiotic. For your nasal congestion flonase.  If any repetitive wheezing rx prednisone given. Rx advisement given on low sugar diet.(explained why not choosing albuterol due to effect on HR and hx of SVT)  CXR today.  Follow up in 7-10 days or as needed  Girlie Veltri, Ramon Dredge, [name]

## 2017-08-29 NOTE — Patient Instructions (Addendum)
You appear to have bronchitis. Rest hydrate and tylenol for fever. I am prescribing cough medicine benzonatate, and doxycycline antibiotic. For your nasal congestion flonase.  If any repetitive wheezing rx prednisone given. Rx advisement given on low sugar diet.(explained why not choosing albuterol due to effect on HR and hx of SVT)  CXR today  Follow up in 7-10 days or as needed

## 2017-08-30 ENCOUNTER — Other Ambulatory Visit: Payer: Self-pay | Admitting: Physician Assistant

## 2017-08-31 ENCOUNTER — Telehealth: Payer: Self-pay | Admitting: Medical

## 2017-08-31 NOTE — Telephone Encounter (Signed)
Pt calling asking for clarification of chest x-ray results. Pt states he viewed the results in Mchart but no one called him to explain what the results meant. Notified pt of  the notes written by Ramon DredgeEdward, PA on 08/29/17. Pt voicing concern about scarring on chest xray results and if he needed to do anything. Explained to the pt that Edward,PA did review the x-ray results and that the scarring seen on the xray is not a new development and does not require intervention at this time. Pt verbalized understanding.

## 2017-09-06 ENCOUNTER — Telehealth: Payer: Self-pay | Admitting: Neurology

## 2017-09-06 NOTE — Telephone Encounter (Signed)
Patient calling to make a follow up appointment for CPAP with Dr. Vickey Hugerohmeier. I offered him 11-29-17 but he says he needs to be seen soon. I told him I can make appointment and put him on a waiting list but he wanted soon appointment.

## 2017-09-07 ENCOUNTER — Encounter: Payer: Self-pay | Admitting: Neurology

## 2017-09-07 ENCOUNTER — Ambulatory Visit: Payer: Medicaid Other | Admitting: Neurology

## 2017-09-07 VITALS — BP 134/79 | HR 59 | Ht 70.0 in | Wt 262.0 lb

## 2017-09-07 DIAGNOSIS — Z9989 Dependence on other enabling machines and devices: Secondary | ICD-10-CM

## 2017-09-07 DIAGNOSIS — I471 Supraventricular tachycardia: Secondary | ICD-10-CM

## 2017-09-07 DIAGNOSIS — J411 Mucopurulent chronic bronchitis: Secondary | ICD-10-CM | POA: Diagnosis not present

## 2017-09-07 DIAGNOSIS — G4733 Obstructive sleep apnea (adult) (pediatric): Secondary | ICD-10-CM | POA: Diagnosis not present

## 2017-09-07 NOTE — Telephone Encounter (Signed)
Called the patient and made him aware there was a cancellation for 3 pm if he would like that apt. Pt verbalized understanding of arriving by 2:30 for check in.

## 2017-09-07 NOTE — Progress Notes (Signed)
SLEEP MEDICINE CLINIC   Provider:  Melvyn Novas, M D  Referring Provider: Dr Hermelinda Medicus , MD   Primary Care Physician:  Marisue Brooklyn  Chief Complaint  Patient presents with  . Follow-up    pt alone, rm 10. cpap is working well, he had the surgery on the nasal airway. after 3-4 months post op is better. pt has questions about the length of time he has been on cpap and what effect it has on the lungs.     HPI:  Richard Walker is a 62 y.o. male , seen originally  here as a referral  from [facility]    2016 sleep consult CD : Chief complaint according to patient : I had a sleep study with Dr Johnell Comings in [facility] 14 years ago , and later Dr. Hazle Coca in [place] in 2002. Not longer working for me. I have a 62 year old mask.  " I cannot get supplies and my wife reports snoring again. "  [name] saw the patient through the year 2016 and diagnosed him with septal nasal deviation, turbinate hypertrophy, history of nasal trauma, chronic maxillary and ethmoid sinusitis, laryngitis, and obstructive sleep apnea with snoring on CPAP. [name] requested the repeat of a sleep study and to treat the patient again for obstructive sleep apnea as indicated. He also wanted to make sure that the future use of CPAP and the concept of surgery on his nose will make the CPAP compliance easier with a better nasal patency. This visit took place on 08/06/2015 with a carbon copy to Dr. Roxan Hockey art, primary care physician.  Sleep habits are as follows:The patient works often early shifts in a [employer] he usually works mornings sometimes afternoons but rarely nights. He works as a Production designer, theatre/television/film. His bedtime varies depending on his work hours. He works one night per week. His average sleep time at night is between 9 and 10 PM, but he doesn't necessarily fall asleep promptly and often voice about his day or is not ready to wind down. He goes to the bedroom to sleep not to watch TV, the  bedroom is cool, quiet and dark. After going to sleep initially he wakes up frequently, most of the time he wakes up because his mouth is parched and dry and he developed headaches. This is better while using CPAP, but he has no supplies.  Since his nasal septum is deviated from a fracture, he seems to habitually resume mouth breathing. He goes to the bathroom at night at least twice. He rises in the morning for an early shift as early as 4 AM, for an afternoon show a shift he may sleep until 7. Frequently he wakes up feeling tired not restored or refreshed and with headaches. The headaches are described as throbbing of morbid the forehead and behind the eyes giving him a pressure sensation. He frequently takes Advil or Tylenol in the morning.   Sleep medical history and family sleep history:  Parents - father was a snorer. materanl grandfather was thunderous. The patient does not have any childhood sleep disorder. Social history:  The patient lives with his wife, he has not smoked cigarettes in 25 years, he does not drink alcohol, after he developed palpitations and tachycardic attacks he stopped using caffeine 1 or 2 cups of coffee now for the day it used to be much more. He does not use sodas or tea.  Interval history from 04/15/2016 Mr. Schara is  seen today after his recent split-night polysomnography on 12/29/2015 which confirmed severe sleep apnea with an AHI of 69.8 he slept only in nonsupine position and there was no REM sleep documented during the diagnostic part of the study. He also had several periodic limb movement related arousals. Once he was titrated to CPAP at a pressure of 9 cm there was a reduction of the AHI to 2.2 there was no significant hypoxemia and no CO2 retention noted. The sleep architecture normalized under CPAP titration. The patient did not tolerate a nasal pillow very well, trialed a dream ware mask and is now using a full face mask. He is using auto Pap between 7 and 12 cm  water pressure his sleepiness score has decreased to 5 points and his fatigue severity is still at 55 points, The patient's compliance has been 97% with 8 hours and 16 minutes maximum use average user time of 5 hours 11 minutes. The mean average daily pressure is 8.9 cm water and the average AHI is 2.7. The machine does not need to be reset this is a very good result. I like for the patient to receive a  CPAP carrying case the that it has a better protection while traveling.  Double history from 07 September 2017.  Mr. Fragoso reports that he lost his father in December 22, multiple health complications and finally lost his job, his wife also had to undergo several surgeries.  2018 was not an easy year for him.  He had seen [name] for a nasal septoplasty surgery it does seem to have helped with nasal patency now but he had some hiccups on the way including epistaxis, and it took a long time to see the mucous lining finally swelled down.  He is now able to use CPAP with good compliance again, his compliance is 90% plus average use of time is 7 hours and 12 minutes, he has used  Auto CPAP with a mean pressure of 8.4 cmH2O , the average AHI residual is 1.9/hr. , no changes in settings have to be done.  Minimum pressure is 7, maximum pressure is 12 cmH2O with 3 cm EPR ramp starts at 4 cmH2O.  He has seen his cardiologist, [name], who has treated him for SVTs by Ablation.  His tachycardia subsided for about 4 months but then after the ablation did come back.  [name] suspect that there is another causative problem.  May be acid reflux, may be apnea -however his apnea should not be a risk factor at this successful resolution. Underwent  upper Gi/ gastroscopy with no abnormalities. He now presents for SOB, coughing and palpitation- he likes to use his CPAP as it helps to prevent tachycardia and palpitations. Chest X ray was taken on 1-7 and he stated he has not been given results. There is an Epic note,  small lung volume old scarring. described productive cough -?  Mucopurulent bronchitis ?[name] On doxicyline and mucinex.     Review of Systems: Out of a complete 14 system review, the patient complains of only the following symptoms, and all other reviewed systems are negative. Shift work, sleep deprived, snoring, dry mouth, nasal airway improved. Coughing , productive, myalgia.    Epworth score 9  , Fatigue severity score 47  , depression score N/a    Social History   Socioeconomic History  . Marital status: Married    Spouse name: Not on file  . Number of children: Not on file  .  Years of education: Not on file  . Highest education level: Not on file  Social Needs  . Financial resource strain: Not on file  . Food insecurity - worry: Not on file  . Food insecurity - inability: Not on file  . Transportation needs - medical: Not on file  . Transportation needs - non-medical: Not on file  Occupational History  . Not on file  Tobacco Use  . Smoking status: Former Games developer  . Smokeless tobacco: Never Used  . Tobacco comment: Uses Hookah Pipe occasionally  Substance and Sexual Activity  . Alcohol use: No  . Drug use: No  . Sexual activity: Yes  Other Topics Concern  . Not on file  Social History Narrative   Marital Status: Married Engineer, drilling)   Children:  4 (3 Sons/1 Daughter)   Pets:  None    Living Situation: Lives with spouse and 4 children   Origin:  He was born in [place].   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     Alcohol Use:  None   Drug Use:  None   Diet:  Regular   Exercise:  None   Hobbies:  Cards    Family History  Problem Relation Age of Onset  . Hypertension Father   . Colon cancer Neg Hx   . Stomach cancer Neg Hx     Past Medical History:  Diagnosis Date  . DDD (degenerative disc disease), lumbar    L3/L4  .  Diverticulosis   . GERD (gastroesophageal reflux disease)   . Hyperlipidemia   . Low sperm motility   . Sleep apnea    uses cpap   . SVT (supraventricular tachycardia) (HCC)     Past Surgical History:  Procedure Laterality Date  . BACK SURGERY    . CARDIAC CATHETERIZATION N/A 12/22/2015   Procedure: Left Heart Cath and Coronary Angiography;  Surgeon: Peter M Swaziland, MD;  Location: [facility] INVASIVE CV LAB;  Service: Cardiovascular;  Laterality: N/A;  . ELECTROPHYSIOLOGIC STUDY N/A 02/19/2016   Procedure: SVT Ablation;  Surgeon: Marinus Maw, MD;  Location: [facility] INVASIVE CV LAB;  Service: Cardiovascular;  Laterality: N/A;  . WISDOM TOOTH EXTRACTION      Current Outpatient Medications  Medication Sig Dispense Refill  . aspirin 81 MG tablet Take 81 mg by mouth daily.    . benzonatate (TESSALON) 100 MG capsule Take 1 capsule (100 mg total) by mouth 3 (three) times daily as needed for cough. 21 capsule 0  . doxycycline (VIBRA-TABS) 100 MG tablet Take 1 tablet (100 mg total) by mouth 2 (two) times daily. 20 tablet 0  . flecainide (TAMBOCOR) 50 MG tablet Take as needed for palpitations despite taking Metoprolol (do not exceed 300 mg in 24 hours) 45 tablet 3  . metoprolol tartrate (LOPRESSOR) 25 MG tablet Take 1 tablet (25 mg total) by mouth 2 (two) times daily. 180 tablet 3  . pantoprazole (PROTONIX) 40 MG tablet Take 1 tablet (40 mg total) by mouth 2 (two) times daily before a meal. 60 tablet 2  . PRESCRIPTION MEDICATION Inhale into the lungs at bedtime. CPAP    . sildenafil (REVATIO) 20 MG tablet Take 1-2 tablets (20-40 mg total) by mouth as needed. 60 tablet 1   No current facility-administered medications for this visit.     Allergies as of 09/07/2017 - Review Complete 09/07/2017  Allergen Reaction Noted  . Pork-derived  products Swelling 12/19/2015  . Statins  01/08/2016    Vitals: BP 134/79   Pulse (!) 59   Ht 5\' 10"  (1.778 m)   Wt 262 lb (118.8 kg)   BMI 37.59 kg/m  Last Weight:    Wt Readings from Last 1 Encounters:  09/07/17 262 lb (118.8 kg)   WUJ:WJXB mass index is 37.59 kg/m.     Last Height:   Ht Readings from Last 1 Encounters:  09/07/17 5\' 10"  (1.778 m)    Physical exam:  General: The patient is awake, alert and appears not in acute distress. The patient is well groomed. Head: Normocephalic, atraumatic. Neck is supple. Mallampati 4  neck circumference 17. 25 . Nasal airflow restrored TMJ is not  evident . Retrognathia is seen.  Cardiovascular:  Regular rate and rhythm , without  murmurs or carotid bruit, and without distended neck veins. Respiratory: bronchial breathing.  Skin:  Without evidence of edema, or rash Trunk: BMI is elevated - The patient's posture is erect.    Neurologic exam : The patient is awake and alert, oriented to place and time.   Memory subjective described as intact.  Attention span & concentration ability appears normal.  Speech is fluent,  without dysarthria, dysphonia or aphasia.  Mood and affect are appropriate.  Cranial nerves: Pupils are equal and briskly reactive to light. Funduscopic exam without  evidence of pallor or edema. Extraocular movements  in vertical and horizontal planes intact and without nystagmus. Visual fields by finger perimetry are intact. Hearing to finger rub intact.   Facial sensation intact to fine touch.  Facial motor strength is symmetric and tongue and uvula move midline. Shoulder shrug was symmetrical.   Motor exam:  Tone, muscle bulk and symmetric strength in all extremities. Sensory:  Fine touch, pinprick and vibration and  Proprioception tested in the upper extremities was normal. Coordination: Rapid alternating movements in the fingers/hands was normal. Finger-to-nose maneuver  normal without evidence of ataxia, dysmetria or tremor. Gait and station: Patient walks without assistive device and is able unassisted to climb up to the exam table. Strength within normal limits. Stance is stable and  normal.   Deep tendon reflexes: in the  upper and lower extremities are symmetric and intact. Babinski maneuver response is downgoing.  The patient was advised of the nature of the diagnosed sleep disorder , the treatment options and risks for general a health and wellness arising from not treating the condition.  I spent more than 40 minutes of face to face time with the patient. Greater than 50% of time was spent in counseling and coordination of care. We have discussed the diagnosis and differential and I answered the patient's questions.     Assessment:  After physical and neurologic examination, review of laboratory studies,  Personal review of imaging studies, reports of other /same  Imaging studies ,  Results of polysomnography/ neurophysiology testing and pre-existing records as far as provided in visit., my assessment is    Mr. Walker Kehr has been using CPAP now for about 14 years.  He is highly compliant and has excellent resolution of OSA.   He will continue to use  a dream wear machine by [employer] and we will fit him with the interface that is best tolerated at this time.  He was hospitalized for atrial fib, underwent ablation. [name], cardiology.  Returned to irregular heart rate .  I advised Mr. hamberger take the Mucinex best in the morning with a lot of  fluids throughout the day, this should help to liquefy the mucus and to expel it coughing.  Suppressing the cough would actually make it worse.  I also urged him to finish the doxycycline treatment.  I like for him to have a pulmonary function test as he complains of fatigue, shortness of breath, palpitations and all this in the setting of a upper respiratory infection.  He should be able to have that evaluation either at primary care or with respiratory therapist at [facility].      Porfirio Mylar Keyvin Rison MD  09/07/2017   CC: Esperanza Richters, Pa-c [address] [place], [place] [address]           SLEEP MEDICINE  CLINIC   Provider:  Melvyn Novas, M D  Referring Provider: Dr Hermelinda Medicus , MD   Primary Care Physician:  Esperanza Richters, [place]  Chief Complaint  Patient presents with  . Follow-up    pt alone, rm 10. cpap is working well, he had the surgery on the nasal airway. after 3-4 months post op is better. pt has questions about the length of time he has been on cpap and what effect it has on the lungs.     HPI:  Richard Walker is a 62 y.o. male , seen here as a referral  from [facility]   Chief complaint according to patient : I had a sleep study with Dr Johnell Comings in [facility] 14 years ago , and later Dr. Hazle Coca in [place] in 2002.  Not longer working for me. I have a 62 year old mask.  " I cannot get supplies and my wife reports snoring again. " [name] saw the patient through the year 2016 and diagnosed him with septal nasal deviation, turbinate hypertrophy, history of nasal trauma, chronic maxillary and ethmoid sinusitis, laryngitis, and obstructive sleep apnea with snoring on CPAP. [name] requested the repeat of a sleep study and to treat the patient again for obstructive sleep apnea as indicated. He also wanted to make sure that the future use of CPAP and the concept of surgery on his nose will make the CPAP compliance easier with a better nasal patency. This visit took place on 08/06/2015 with a carbon copy to Dr. Roxan Hockey art, primary care physician.  Sleep habits are as follows:The patient works often early shifts in a [employer] he usually works mornings sometimes afternoons but rarely nights. He works as a Production designer, theatre/television/film. His bedtime varies depending on his work hours. He works one night per week. His average sleep time at night is between 9 and 10 PM, but he doesn't necessarily fall asleep promptly and often voice about his day or is not ready to wind down. He goes to the bedroom to sleep not to watch TV, the bedroom is cool, quiet and dark. After going to sleep  initially he wakes up frequently, most of the time he wakes up because his mouth is parched and dry and he developed headaches. This is better while using CPAP, but he has no supplies.  Since his nasal septum is deviated from a fracture, he seems to habitually resume mouth breathing. He goes to the bathroom at night at least twice. He rises in the morning for an early shift as early as 4 AM, for an afternoon show a shift he may sleep until 7. Frequently he wakes up feeling tired not restored or refreshed and with headaches. The headaches are described as throbbing of  morbid the forehead and behind the eyes giving him a pressure sensation. He frequently takes Advil or Tylenol in the morning.   Sleep medical history and family sleep history:  Parents - father was a snorer. materanl grandfather was thunderous. The patient does not have any childhood sleep disorder. Social history:  The patient lives with his wife, he has not smoked cigarettes in 25 years, he does not drink alcohol, after he developed palpitations and tachycardic attacks he stopped using caffeine 1 or 2 cups of coffee now for the day it used to be much more. He does not use sodas or tea.  Interval history from 04/15/2016 Mr. Mceuen is seen today after his recent split-night polysomnography on 12/29/2015 which confirmed severe sleep apnea with an AHI of 69.8 he slept only in nonsupine position and there was no REM sleep documented during the diagnostic part of the study. He also had several periodic limb movement related arousals. Once he was titrated to CPAP at a pressure of 9 cm there was a reduction of the AHI to 2.2 there was no significant hypoxemia and no CO2 retention noted. The sleep architecture normalized under CPAP titration. The patient did not tolerate a nasal pillow very well, trialed a dream ware mask and is now using a full face mask. He is using auto Pap between 7 and 12 cm water pressure his sleepiness score has decreased to 5  points and his fatigue severity is still at 55 points,  The patient's compliance has been 97% with 8 hours and 16 minutes maximum use average user time of 5 hours 11 minutes. The mean average daily pressure is 8.9 cm water and the average AHI is 2.7. The machine does not need to be reset this is a very good result. I like for the patient to receive a  CPAP carrying case the that it has a better protection while  traveling.  Review of Systems: Out of a complete 14 system review, the patient complains of only the following symptoms, and all other reviewed systems are negative. Shift work, sleep deprived, snoring, dry mouth, nasal obstruction.  Epworth score  6 , Fatigue severity score 35  , depression score N/a    Social History   Socioeconomic History  . Marital status: Married    Spouse name: Not on file  . Number of children: Not on file  . Years of education: Not on file  . Highest education level: Not on file  Social Needs  . Financial resource strain: Not on file  . Food insecurity - worry: Not on file  . Food insecurity - inability: Not on file  . Transportation needs - medical: Not on file  . Transportation needs - non-medical: Not on file  Occupational History  . Not on file  Tobacco Use  . Smoking status: Former Games developer  . Smokeless tobacco: Never Used  . Tobacco comment: Uses Hookah Pipe occasionally  Substance and Sexual Activity  . Alcohol use: No  . Drug use: No  . Sexual activity: Yes  Other Topics Concern  . Not on file  Social History Narrative   Marital Status: Married Engineer, drilling)   Children:  4 (3 Sons/1 Daughter)   Pets:  None    Living Situation: Lives with spouse and 4 children   Origin:  He was born in [place].   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  Alcohol Use:  None   Drug Use:  None   Diet:  Regular   Exercise:   None   Hobbies:  Cards    Family History  Problem Relation Age of Onset  . Hypertension Father   . Colon cancer Neg Hx   . Stomach cancer Neg Hx     Past Medical History:  Diagnosis Date  . DDD (degenerative disc disease), lumbar    L3/L4  . Diverticulosis   . GERD (gastroesophageal reflux disease)   . Hyperlipidemia   . Low sperm motility   . Sleep apnea    uses cpap   . SVT (supraventricular tachycardia) (HCC)     Past Surgical History:  Procedure Laterality Date  . BACK SURGERY    . CARDIAC CATHETERIZATION N/A 12/22/2015   Procedure: Left Heart Cath and Coronary Angiography;  Surgeon: Peter M SwazilandJordan, MD;  [facility] LAB;  Service: Cardiovascular;  Laterality: N/A;  . ELECTROPHYSIOLOGIC STUDY N/A 02/19/2016   Procedure: SVT Ablation;  Surgeon: Marinus MawGregg W Taylor, MD;  [facility];  Service: Cardiovascular;  Laterality: N/A;  . WISDOM TOOTH EXTRACTION      Current Outpatient Medications  Medication Sig Dispense Refill  . aspirin 81 MG tablet Take 81 mg by mouth daily.    . benzonatate (TESSALON) 100 MG capsule Take 1 capsule (100 mg total) by mouth 3 (three) times daily as needed for cough. 21 capsule 0  . doxycycline (VIBRA-TABS) 100 MG tablet Take 1 tablet (100 mg total) by mouth 2 (two) times daily. 20 tablet 0  . flecainide (TAMBOCOR) 50 MG tablet Take as needed for palpitations despite taking Metoprolol (do not exceed 300 mg in 24 hours) 45 tablet 3  . metoprolol tartrate (LOPRESSOR) 25 MG tablet Take 1 tablet (25 mg total) by mouth 2 (two) times daily. 180 tablet 3  . pantoprazole (PROTONIX) 40 MG tablet Take 1 tablet (40 mg total) by mouth 2 (two) times daily before a meal. 60 tablet 2  . PRESCRIPTION MEDICATION Inhale into the lungs at bedtime. CPAP    . sildenafil (REVATIO) 20 MG tablet Take 1-2 tablets (20-40 mg total) by mouth as needed. 60 tablet 1   No current facility-administered medications for this visit.     Allergies as of  09/07/2017 - Review Complete 09/07/2017  Allergen Reaction Noted  . Pork-derived products Swelling 12/19/2015  . Statins  01/08/2016    Vitals: BP 134/79   Pulse (!) 59   Ht 5\' 10"  (1.778 m)   Wt 262 lb (118.8 kg)   BMI 37.59 kg/m  Last Weight:  Wt Readings from Last 1 Encounters:  09/07/17 262 lb (118.8 kg)   ZOX:WRUEBMI:Body mass index is 37.59 kg/m.     Last Height:   Ht Readings from Last 1 Encounters:  09/07/17 5\' 10"  (1.778 m)    Physical exam:  General: The patient is awake, alert and appears not in acute distress. The patient is well groomed. Head: Normocephalic, atraumatic. Neck is supple. Mallampati 4  neck circumference 17.5 . Nasal airflow restricted , TMJ is not  evident . Retrognathia is seen.  Cardiovascular:  Regular rate and rhythm , without  murmurs or carotid bruit, and without distended neck veins. Respiratory: Lungs are clear to auscultation. Skin:  Without evidence of edema, or rash Trunk: BMI is elevated - The patient's posture is erect.    Neurologic exam : The patient is awake and alert, oriented to place and time.  Memory subjective described as intact.  Attention span & concentration ability appears normal.  Speech is fluent,  without dysarthria, dysphonia or aphasia.  Mood and affect are appropriate.  Cranial nerves: Pupils are equal and briskly reactive to light. Funduscopic exam without  evidence of pallor or edema. Extraocular movements  in vertical and horizontal planes intact and without nystagmus. Visual fields by finger perimetry are intact. Hearing to finger rub intact.   Facial sensation intact to fine touch.  Facial motor strength is symmetric and tongue and uvula move midline. Shoulder shrug was symmetrical.   Motor exam:  Tone, muscle bulk and symmetric strength in all extremities. Sensory:  Fine touch, pinprick and vibration and  Proprioception tested in the upper extremities was normal. Coordination: Rapid alternating movements in the  fingers/hands was normal. Finger-to-nose maneuver  normal without evidence of ataxia, dysmetria or tremor. Gait and station: Patient walks without assistive device and is able unassisted to climb up to the exam table. Strength within normal limits. Stance is stable and normal.   Deep tendon reflexes: in the  upper and lower extremities are symmetric and intact. Babinski maneuver response is downgoing.  The patient was advised of the nature of the diagnosed sleep disorder , the treatment options and risks for general a health and wellness arising from not treating the condition.  I spent more than 40 minutes of face to face time with the patient. Greater than 50% of time was spent in counseling and coordination of care. We have discussed the diagnosis and differential and I answered the patient's questions.     Assessment:  After physical and neurologic examination, review of laboratory studies,  Personal review of imaging studies, reports of other /same  Imaging studies ,  Results of polysomnography/ neurophysiology testing and pre-existing records as far as provided in visit., my assessment is   1) Mr. Walker Kehr is likely still suffering from obstructive sleep apnea as he has described them as his wife has also witnessed. I would strongly recommend that his turbinate reduction and nasal septoplasty should be performed as it will help him to tolerate nasal CPAP better. He has been using CPAP now for about 14 years.    He will continue to use  a dream wear machine by [employer] and we will fit him with the interface that is best tolerated at this time.  He may need a heated tube  to allow him a high humidity level without condensation  He will still need to undergo nasal septoplasty , this will affect his CPAP use positively. He was hospitalized for atrial fib, underwent ablation. [name], cardiology.     Plan:  Treatment plan and additional workup :  SPLIT night, CO2 and nasal passage  obstruction.  ENT procedure is not yet scheduled.     Porfirio Mylar Tamika Shropshire MD  09/07/2017   CC: Esperanza Richters, Pa-c [address] [place], [place] [address]

## 2017-09-26 ENCOUNTER — Ambulatory Visit (INDEPENDENT_AMBULATORY_CARE_PROVIDER_SITE_OTHER): Payer: Medicaid Other | Admitting: Medical

## 2017-09-26 ENCOUNTER — Telehealth: Payer: Self-pay | Admitting: Medical

## 2017-09-26 ENCOUNTER — Ambulatory Visit (HOSPITAL_BASED_OUTPATIENT_CLINIC_OR_DEPARTMENT_OTHER)
Admission: RE | Admit: 2017-09-26 | Discharge: 2017-09-26 | Disposition: A | Discharge status: Home / Self Care | Payer: Medicaid Other | Source: Ambulatory Visit | Attending: Medical | Admitting: Medical

## 2017-09-26 ENCOUNTER — Encounter: Payer: Self-pay | Admitting: Medical

## 2017-09-26 VITALS — BP 126/76 | HR 74 | Temp 98.2°F | Resp 16 | Ht 70.0 in | Wt 263.6 lb

## 2017-09-26 DIAGNOSIS — R05 Cough: Secondary | ICD-10-CM

## 2017-09-26 DIAGNOSIS — J984 Other disorders of lung: Secondary | ICD-10-CM | POA: Insufficient documentation

## 2017-09-26 DIAGNOSIS — R059 Cough, unspecified: Secondary | ICD-10-CM

## 2017-09-26 DIAGNOSIS — R062 Wheezing: Secondary | ICD-10-CM

## 2017-09-26 DIAGNOSIS — J111 Influenza due to unidentified influenza virus with other respiratory manifestations: Secondary | ICD-10-CM

## 2017-09-26 DIAGNOSIS — J40 Bronchitis, not specified as acute or chronic: Secondary | ICD-10-CM

## 2017-09-26 DIAGNOSIS — Z87891 Personal history of nicotine dependence: Secondary | ICD-10-CM

## 2017-09-26 LAB — POCT INFLUENZA A/B
Influenza A, POC: POSITIVE — AB
Influenza B, POC: NEGATIVE

## 2017-09-26 MED ORDER — HYDROCODONE-HOMATROPINE 5-1.5 MG/5ML PO SYRP: 5.0000 mL | ORAL_SOLUTION | Freq: Three times a day (TID) | ORAL | 0 refills | Status: DC | PRN

## 2017-09-26 MED ORDER — AZITHROMYCIN 250 MG PO TABS: ORAL_TABLET | ORAL | 0 refills | Status: DC

## 2017-09-26 MED ORDER — ALBUTEROL SULFATE HFA 108 (90 BASE) MCG/ACT IN AERS: 2.0000 | INHALATION_SPRAY | Freq: Four times a day (QID) | RESPIRATORY_TRACT | 2 refills | Status: DC | PRN

## 2017-09-26 MED ORDER — OSELTAMIVIR PHOSPHATE 75 MG PO CAPS: 75.0000 mg | ORAL_CAPSULE | Freq: Two times a day (BID) | ORAL | 0 refills | Status: DC

## 2017-09-26 MED ORDER — PREDNISONE 10 MG PO TABS: ORAL_TABLET | ORAL | 0 refills | Status: DC

## 2017-09-26 NOTE — Progress Notes (Signed)
Subjective:    Patient ID: Richard Walker, male    DOB: 10-03-1955, 62 y.o.   MRN: [id number]  HPI  Pt in + flu.Symptoms came on yesterday.   Pt states he has severe fatigue, cough, does have some diffuse achiness, with some wheezing.  Pt last xray had mentioned some possible scarring(on last cxr). Pt is a formers smoker.  Pt not coughing up much mucus. When he does cough up some he states he feels better.  Pt did get opinion from sleep doctor on lingering bronchitis symptoms and she recommended finish the doxycycline.      Review of Systems  Constitutional: Positive for fatigue.  HENT: Positive for congestion. Negative for sinus pressure, sinus pain and sneezing.   Respiratory: Positive for cough and wheezing. Negative for choking and chest tightness.   Cardiovascular: Negative for chest pain and palpitations.  Gastrointestinal: Negative for abdominal pain, blood in stool, diarrhea and vomiting.  Genitourinary: Negative for dysuria and flank pain.  Musculoskeletal: Positive for myalgias. Negative for back pain and neck pain.  Skin: Negative for rash.  Neurological: Negative for dizziness, seizures, weakness and headaches.  Hematological: Negative for adenopathy. Does not bruise/bleed easily.  Psychiatric/Behavioral: Negative for behavioral problems, decreased concentration and self-injury. The patient is not nervous/anxious.     Past Medical History:  Diagnosis Date  . DDD (degenerative disc disease), lumbar    L3/L4  . Diverticulosis   . GERD (gastroesophageal reflux disease)   . Hyperlipidemia   . Low sperm motility   . Sleep apnea    uses cpap   . SVT (supraventricular tachycardia) (HCC)      Social History   Socioeconomic History  . Marital status: Married    Spouse name: Not on file  . Number of children: Not on file  . Years of education: Not on file  . Highest education level: Not on file  Social Needs  . Financial resource strain: Not on file  . Food  insecurity - worry: Not on file  . Food insecurity - inability: Not on file  . Transportation needs - medical: Not on file  . Transportation needs - non-medical: Not on file  Occupational History  . Not on file  Tobacco Use  . Smoking status: Former Games developer  . Smokeless tobacco: Never Used  . Tobacco comment: Uses Hookah Pipe occasionally  Substance and Sexual Activity  . Alcohol use: No  . Drug use: No  . Sexual activity: Yes  Other Topics Concern  . Not on file  Social History Narrative   Marital Status: Married Engineer, drilling)   Children:  4 (3 Sons/1 Daughter)   Pets:  None    Living Situation: Lives with spouse and 4 children   Origin:  He was born in [place].   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     Alcohol Use:  None   Drug Use:  None   Diet:  Regular   Exercise:  None   Hobbies:  Cards    Past Surgical History:  Procedure Laterality Date  . BACK SURGERY    . CARDIAC CATHETERIZATION N/A 12/22/2015   Procedure: Left Heart Cath and Coronary Angiography;  Surgeon: Peter M Swaziland, MD;  Location: [facility] INVASIVE CV LAB;  Service: Cardiovascular;  Laterality: N/A;  . ELECTROPHYSIOLOGIC STUDY N/A 02/19/2016   Procedure: SVT Ablation;  Surgeon: Marinus Maw, MD;  Location: [facility]  INVASIVE CV LAB;  Service: Cardiovascular;  Laterality: N/A;  . WISDOM TOOTH EXTRACTION      Family History  Problem Relation Age of Onset  . Hypertension Father   . Colon cancer Neg Hx   . Stomach cancer Neg Hx     Allergies  Allergen Reactions  . Pork-Derived Products Swelling  . Statins     Muscle aches    Current Outpatient Medications on File Prior to Visit  Medication Sig Dispense Refill  . aspirin 81 MG tablet Take 81 mg by mouth daily.    . benzonatate (TESSALON) 100 MG capsule Take 1 capsule (100 mg total) by mouth 3 (three) times daily as needed for  cough. 21 capsule 0  . doxycycline (VIBRA-TABS) 100 MG tablet Take 1 tablet (100 mg total) by mouth 2 (two) times daily. 20 tablet 0  . flecainide (TAMBOCOR) 50 MG tablet Take as needed for palpitations despite taking Metoprolol (do not exceed 300 mg in 24 hours) 45 tablet 3  . metoprolol tartrate (LOPRESSOR) 25 MG tablet Take 1 tablet (25 mg total) by mouth 2 (two) times daily. 180 tablet 3  . pantoprazole (PROTONIX) 40 MG tablet Take 1 tablet (40 mg total) by mouth 2 (two) times daily before a meal. 60 tablet 2  . PRESCRIPTION MEDICATION Inhale into the lungs at bedtime. CPAP    . sildenafil (REVATIO) 20 MG tablet Take 1-2 tablets (20-40 mg total) by mouth as needed. 60 tablet 1   No current facility-administered medications on file prior to visit.     BP 126/76   Pulse 74   Temp 98.2 F (36.8 C) (Oral)   Resp 16   Ht 5\' 10"  (1.778 m)   Wt 263 lb 9.6 oz (119.6 kg)   SpO2 99%   BMI 37.82 kg/m       Objective:   Physical Exam  General  Mental Status - Alert. General Appearance - Well groomed. Not in acute distress.  Skin Rashes- No Rashes.  HEENT Head- Normal. Ear Auditory Canal - Left- Normal. Right - Normal.Tympanic Membrane- Left- Normal. Right- Normal. Eye Sclera/Conjunctiva- Left- Normal. Right- Normal. Nose & Sinuses Nasal Mucosa- Left-  Boggy and Congested. Right-  Boggy and  Congested.Bilateral no  maxillary and  No frontal sinus pressure. Mouth & Throat Lips: Upper Lip- Normal: no dryness, cracking, pallor, cyanosis, or vesicular eruption. Lower Lip-Normal: no dryness, cracking, pallor, cyanosis or vesicular eruption. Buccal Mucosa- Bilateral- No Aphthous ulcers. Oropharynx- No Discharge or Erythema. Tonsils: Characteristics- Bilateral- No Erythema or Congestion. Size/Enlargement- Bilateral- No enlargement. Discharge- bilateral-None.  Neck Neck- Supple. No Masses.   Chest and Lung Exam Auscultation: Breath Sounds:-Clear even and  unlabored.  Cardiovascular Auscultation:Rythm- Regular, rate and rhythm. Murmurs & Other Heart Sounds:Ausculatation of the heart reveal- No Murmurs.  Lymphatic Head & Neck General Head & Neck Lymphatics: Bilateral: Description- No Localized lymphadenopathy.       Assessment & Plan:  You have the flu. I am treating you with tamiflu. Rest, hydrate and take tylenol for fever  if necessary for body ache or fever. You should gradually improve but if not please let us know.  For cough rx hycodan. For nasal congestion  flonase.  For wheezing rx albuterol inhaler. If you have constant wheezing type symptoms then start 5 day taper prednisone course.  Since you report never got over prior bronchitis and now have the above symptoms  will repeat cxr.  If chest xray shows pneumonia or if you worsen azithromycin.  Mackie Pai, PA-C

## 2017-09-26 NOTE — Telephone Encounter (Signed)
Referral to pulmonologist placed. 

## 2017-09-26 NOTE — Patient Instructions (Addendum)
You have the flu. I am treating you with tamiflu. Rest, hydrate and take tylenol for fever  if necessary for body ache or fever. You should gradually improve but if not please let us know.  For cough rx hycodan. For nasal congestion  flonase.  For wheezing rx albuterol inhaler. If you have constant wheezing type symptoms then start 5 day taper prednisone course.  Since you report never got over prior bronchitis and now have the above symptoms will repeat cxr.  If chest xray shows pneumonia or if you worsen as discussed then start azithromycin.  Follow up in 7-10 days or as needed.

## 2017-10-02 ENCOUNTER — Encounter: Payer: Self-pay | Admitting: Medical

## 2017-10-03 ENCOUNTER — Telehealth: Payer: Self-pay | Admitting: Medical

## 2017-10-03 NOTE — Telephone Encounter (Signed)
Please see the referral to the pulmonologist.  Patient is calling and asking for update on referral.  Thanks

## 2017-10-13 ENCOUNTER — Ambulatory Visit: Payer: Medicaid Other | Admitting: Podiatry

## 2017-10-13 ENCOUNTER — Encounter: Payer: Self-pay | Admitting: Podiatry

## 2017-10-13 ENCOUNTER — Encounter: Payer: Self-pay | Admitting: Pulmonary Disease

## 2017-10-13 ENCOUNTER — Ambulatory Visit (INDEPENDENT_AMBULATORY_CARE_PROVIDER_SITE_OTHER): Payer: Medicaid Other | Admitting: Pulmonary Disease

## 2017-10-13 VITALS — BP 146/95 | HR 72 | Ht 70.0 in | Wt 266.0 lb

## 2017-10-13 DIAGNOSIS — M722 Plantar fascial fibromatosis: Secondary | ICD-10-CM | POA: Diagnosis not present

## 2017-10-13 DIAGNOSIS — M205X9 Other deformities of toe(s) (acquired), unspecified foot: Secondary | ICD-10-CM

## 2017-10-13 DIAGNOSIS — M21969 Unspecified acquired deformity of unspecified lower leg: Secondary | ICD-10-CM

## 2017-10-13 DIAGNOSIS — M216X2 Other acquired deformities of left foot: Secondary | ICD-10-CM

## 2017-10-13 DIAGNOSIS — R0602 Shortness of breath: Secondary | ICD-10-CM

## 2017-10-13 DIAGNOSIS — Z87891 Personal history of nicotine dependence: Secondary | ICD-10-CM

## 2017-10-13 DIAGNOSIS — M216X1 Other acquired deformities of right foot: Secondary | ICD-10-CM | POA: Diagnosis not present

## 2017-10-13 DIAGNOSIS — R053 Chronic cough: Secondary | ICD-10-CM

## 2017-10-13 DIAGNOSIS — R05 Cough: Secondary | ICD-10-CM | POA: Diagnosis not present

## 2017-10-13 NOTE — Progress Notes (Signed)
Richard Walker, Richard Walker, Richard Walker  Chief Complaint  Patient presents with  . pulm consult    Pt referred by Dr. Esperanza RichtersEdward Saguier MD. Pt has productive cough-not sure of color; pt had xray downstairs Richard found a scar on lungs. Pt SOB with exertion, some wheezing, Richard having hard time breathing when walking in last few weeks.Pt uses cpap machine, has had machine over 6157yrs.    Vital signs: BP (!) 146/95 (BP Location: Left Arm, Cuff Size: Normal)   Pulse 72   Ht 5\' 10"  (1.778 m)   Wt 266 lb (120.7 kg)   SpO2 97%   BMI 38.17 kg/m   History of Present Illness: Richard Walker is a 62 y.o. male former smoker with dyspnea Richard cough.  He used to smoked 1/2 to 1 pack per day Richard quit in 1992 after he got married.  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  He was under lot of stress.  He noticed his breathing has been getting worse.  He now has trouble walking up stairs.  He has a history of sleep apnea Richard has been using CPAP.  He will sometimes use his CPAP to help with his breathing during the day.    He also started getting cough, wheeze, Richard chest congestion.  He was getting sinus congestion also.  He was tried on an antibiotic Richard prednisone.  He feels like a liquid cough Walker helped the most.  He was prescribed albuterol, but didn't use this.  He has history of atrial fibrillation Richard was concerned about what albuterol could do to his heart rhythm.  He had chest xray that showed scarring.   Physical Exam:  General - pleasant Eyes - pupils reactive ENT - no sinus tenderness, no oral exudate, no LAN, clear nasal drainage, MP 3 Cardiac - regular, no murmur Chest - no wheeze, rales Abd - soft, non tender Ext - no edema Skin - no rashes Neuro - normal strength Psych - normal mood  CXR 09/26/17 >> RML scarring  Discussion: He has progressive dyspnea Richard cough associated with wheezing Richard intermittent sputum production.  He was recently found  to have Influenza A, but his symptoms predate this.  He has history of smoking Richard was found to have scarring in his lungs.  Assessment/Plan:  Progressive dyspnea with cough Richard wheeze. - will arrange for high resolution CT chest Richard Walker function test to further assess - he would like to defer any medications until he has additional testing; he is concerned about how any medications might impact his heart rhythm with history of atrial fibrillation - he has annual visit with PCP first week of March Richard says he usually gets lab work done then, Richard would like to wait until then; for my purposes he needs CMET Richard CBC with differential to start with   Patient Instructions  Will schedule high resolution CT chest Richard Walker function test  Follow up in 2 to 3 weeks with Dr. Craige CottaSood or Nurse Practitioner    Coralyn HellingVineet Mariza Bourget, MD [facility]/Richard Walker 10/13/2017, 11:46 AM Pager:  [phone]  Flow Sheet  Walker tests:  Sleep tests: PSG 12/29/15 >> AHI 69.8, SpO2 low 89%  Cardiac tests: Echo 04/01/16 >> mild LVH, EF 55 to 60%  Review of Systems: Constitutional: Negative for fever Richard unexpected weight change.  HENT: Positive for congestion, postnasal drip Richard sneezing. Negative for dental problem, ear pain, nosebleeds, rhinorrhea, sinus pressure, sore  throat Richard trouble swallowing.   Eyes: Negative for redness Richard itching.  Respiratory: Positive for cough, chest tightness, shortness of breath Richard wheezing.   Cardiovascular: Negative for palpitations Richard leg swelling.  Gastrointestinal: Negative for nausea Richard vomiting.  Genitourinary: Negative for dysuria.  Musculoskeletal: Positive for joint swelling.  Skin: Negative for rash.  Allergic/Immunologic: Negative.  Negative for environmental allergies, food allergies Richard immunocompromised state.  Neurological: Positive for headaches.  Hematological: Does not bruise/bleed easily.  Psychiatric/Behavioral: Negative for dysphoric  mood. The patient is not nervous/anxious.    Past Medical History: He  has a past medical history of DDD (degenerative disc disease), lumbar, Diverticulosis, GERD (gastroesophageal reflux disease), Hyperlipidemia, Low sperm motility, Sleep apnea, Richard SVT (supraventricular tachycardia) (HCC).  Past Surgical History: He  has a past surgical history that includes Back surgery; Wisdom tooth extraction; Cardiac catheterization (N/A, 12/22/2015); Richard Cardiac catheterization (N/A, 02/19/2016).  Family History: His family history includes Hypertension in his father.  Social History: He  reports that he quit smoking about 27 years ago. His smoking use included cigarettes. He has a 10.00 pack-year smoking history. he has never used smokeless tobacco. He reports that he does not drink alcohol or use drugs.  Medications: Allergies as of 10/13/2017      Reactions   Pork-derived Products Swelling   Statins    Muscle aches      Medication List        Accurate as of 10/13/17 11:46 AM. Always use your most recent med list.          albuterol 108 (90 Base) MCG/ACT inhaler Commonly known as:  PROVENTIL HFA;VENTOLIN HFA Inhale 2 puffs into the lungs every 6 (six) hours as needed for wheezing or shortness of breath.   aspirin 81 MG tablet Take 81 mg by mouth daily.   flecainide 50 MG tablet Commonly known as:  TAMBOCOR Take as needed for palpitations despite taking Metoprolol (do not exceed 300 mg in 24 hours)   metoprolol tartrate 25 MG tablet Commonly known as:  LOPRESSOR Take 1 tablet (25 mg total) by mouth 2 (two) times daily.   pantoprazole 40 MG tablet Commonly known as:  PROTONIX Take 1 tablet (40 mg total) by mouth 2 (two) times daily before a meal.   predniSONE 10 MG tablet Commonly known as:  DELTASONE 5 TAB PO DAY 1 4 TAB PO DAY 2 3 TAB PO DAY 3 2 TAB PO DAY 4 1 TAB PO DAY 5   PRESCRIPTION MEDICATION Inhale into the lungs at bedtime. CPAP   sildenafil 20 MG  tablet Commonly known as:  REVATIO Take 1-2 tablets (20-40 mg total) by mouth as needed.

## 2017-10-13 NOTE — Patient Instructions (Signed)
Will schedule high resolution CT chest and pulmonary function test  Follow up in 2 to 3 weeks with Dr. Craige CottaSood or Nurse Practitioner

## 2017-10-13 NOTE — Patient Instructions (Signed)
Seen for painful feet. Noted of abnormal bone position, elevated first metatarsal and fail to support medial column and arthritic change at the first Metatarsalphalangeal joint( Big toe joint). May benefit from Advil as needed, custom orthotics, and reduce weight bearing activity.

## 2017-10-13 NOTE — Progress Notes (Signed)
SUBJECTIVE: 62 y.o. year old male presents complaining of bilateral foot pain.Pain is in arches, ball, and at the big toe joint x about 15 years. Stated that his feet are flat. Not on feet much since lost job last year. At that time he was on feet 10-12 hours on feet.  Review of Systems  Constitutional: Negative.   HENT: Negative.   Eyes: Negative.   Respiratory:       Being monitored for scar in lung.  Cardiovascular:       Ablation December 2017.  Gastrointestinal: Negative.   Genitourinary: Negative.   Musculoskeletal: Positive for joint pain.       Knees and ankle joint pain. Surgery on L3,4 in past 15 years ago. Flares up at times.   Skin: Negative.     OBJECTIVE: DERMATOLOGIC EXAMINATION: No abnormal findings.  VASCULAR EXAMINATION OF LOWER LIMBS: All pedal pulses are palpable with normal pulsation.  Capillary Filling times within 3 seconds in all digits.  No edema or erythema noted. Temperature gradient from tibial crest to dorsum of foot is within normal bilateral.  NEUROLOGIC EXAMINATION OF THE LOWER LIMBS: All epicritic and tactile sensations grossly intact.  MUSCULOSKELETAL EXAMINATION: Positive for excess sagittal plane elevation of the first ray R>L. Hypermobile first ray bilateral. Plantar arch pain with weight bearing bilateral. STJ hyperpronation with weight bearing bilateral. Limited first MPJ motion bilateral with loading of forefoot.  RADIOGRAPHIC STUDIES:  AP View:  First MPJ joint space narrowing, Mild hallux valgus, enlarged medial eminence first metatarsal, Fibular sesamoid position 4 bilateral. Severe increased lateral deviation angle CCJ bilateral. Lateral view:  Severe sagittal plane elevation first metatarsl bilateral, dorsal spur first metatarsl head and proximal phalanx, bilateral, plantar and posterior heel spur bilateral. Anterior CYMA line advancement on right.   ASSESSMENT: Plantar fasciitis bilateral. Hypermobile first ray  bilateral. Hallux limitus bilateral. STJ hyperpronation bilateral.  PLAN: Reviewed clinical findings and available treatment options, conservative such as NSAIA, Advil, injection, Orthotcs, reduce weight bearing activity and surgical. Patient will return when he is ready to order orthotics.

## 2017-10-13 NOTE — Progress Notes (Signed)
   Subjective:    Patient ID: Richard Walker, male    DOB: 1956/05/26, 62 y.o.   MRN: [id number]  HPI    Review of Systems  Constitutional: Negative for fever and unexpected weight change.  HENT: Positive for congestion, postnasal drip and sneezing. Negative for dental problem, ear pain, nosebleeds, rhinorrhea, sinus pressure, sore throat and trouble swallowing.   Eyes: Negative for redness and itching.  Respiratory: Positive for cough, chest tightness, shortness of breath and wheezing.   Cardiovascular: Negative for palpitations and leg swelling.  Gastrointestinal: Negative for nausea and vomiting.  Genitourinary: Negative for dysuria.  Musculoskeletal: Positive for joint swelling.  Skin: Negative for rash.  Allergic/Immunologic: Negative.  Negative for environmental allergies, food allergies and immunocompromised state.  Neurological: Positive for headaches.  Hematological: Does not bruise/bleed easily.  Psychiatric/Behavioral: Negative for dysphoric mood. The patient is not nervous/anxious.        Objective:   Physical Exam        Assessment & Plan:

## 2017-10-21 ENCOUNTER — Ambulatory Visit (HOSPITAL_BASED_OUTPATIENT_CLINIC_OR_DEPARTMENT_OTHER)
Admission: RE | Admit: 2017-10-21 | Discharge: 2017-10-21 | Disposition: A | Discharge status: Home / Self Care | Payer: Medicaid Other | Source: Ambulatory Visit | Attending: Pulmonary Disease | Admitting: Pulmonary Disease

## 2017-10-21 DIAGNOSIS — R0602 Shortness of breath: Secondary | ICD-10-CM | POA: Insufficient documentation

## 2017-10-21 DIAGNOSIS — I2510 Atherosclerotic heart disease of native coronary artery without angina pectoris: Secondary | ICD-10-CM | POA: Diagnosis not present

## 2017-10-21 DIAGNOSIS — J984 Other disorders of lung: Secondary | ICD-10-CM | POA: Diagnosis not present

## 2017-10-21 DIAGNOSIS — R053 Chronic cough: Secondary | ICD-10-CM

## 2017-10-21 DIAGNOSIS — R05 Cough: Secondary | ICD-10-CM | POA: Diagnosis not present

## 2017-10-21 DIAGNOSIS — I700 Atherosclerosis of aorta: Secondary | ICD-10-CM | POA: Diagnosis not present

## 2017-10-21 DIAGNOSIS — Z87891 Personal history of nicotine dependence: Secondary | ICD-10-CM | POA: Diagnosis not present

## 2017-10-21 DIAGNOSIS — K760 Fatty (change of) liver, not elsewhere classified: Secondary | ICD-10-CM | POA: Insufficient documentation

## 2017-10-24 ENCOUNTER — Ambulatory Visit (INDEPENDENT_AMBULATORY_CARE_PROVIDER_SITE_OTHER): Payer: Medicaid Other | Admitting: Medical

## 2017-10-24 ENCOUNTER — Other Ambulatory Visit: Payer: Medicaid Other

## 2017-10-24 ENCOUNTER — Telehealth: Payer: Self-pay | Admitting: Medical

## 2017-10-24 ENCOUNTER — Encounter: Payer: Self-pay | Admitting: Medical

## 2017-10-24 VITALS — BP 112/77 | HR 67 | Temp 97.7°F | Resp 16 | Ht 70.0 in | Wt 267.2 lb

## 2017-10-24 DIAGNOSIS — Z23 Encounter for immunization: Secondary | ICD-10-CM | POA: Diagnosis not present

## 2017-10-24 DIAGNOSIS — R739 Hyperglycemia, unspecified: Secondary | ICD-10-CM

## 2017-10-24 DIAGNOSIS — Z113 Encounter for screening for infections with a predominantly sexual mode of transmission: Secondary | ICD-10-CM | POA: Diagnosis not present

## 2017-10-24 DIAGNOSIS — B181 Chronic viral hepatitis B without delta-agent: Secondary | ICD-10-CM | POA: Diagnosis not present

## 2017-10-24 DIAGNOSIS — R319 Hematuria, unspecified: Secondary | ICD-10-CM

## 2017-10-24 DIAGNOSIS — Z125 Encounter for screening for malignant neoplasm of prostate: Secondary | ICD-10-CM

## 2017-10-24 DIAGNOSIS — R5383 Other fatigue: Secondary | ICD-10-CM

## 2017-10-24 DIAGNOSIS — Z0000 Encounter for general adult medical examination without abnormal findings: Secondary | ICD-10-CM

## 2017-10-24 LAB — TSH: TSH: 2.31 u[IU]/mL (ref 0.35–4.50)

## 2017-10-24 LAB — CBC WITH DIFFERENTIAL/PLATELET
BASOS ABS: 0 10*3/uL (ref 0.0–0.1)
Basophils Relative: 0.5 % (ref 0.0–3.0)
EOS PCT: 2.4 % (ref 0.0–5.0)
Eosinophils Absolute: 0.1 10*3/uL (ref 0.0–0.7)
HCT: 46.8 % (ref 39.0–52.0)
HEMOGLOBIN: 15.6 g/dL (ref 13.0–17.0)
LYMPHS ABS: 1.5 10*3/uL (ref 0.7–4.0)
LYMPHS PCT: 31.3 % (ref 12.0–46.0)
MCHC: 33.4 g/dL (ref 30.0–36.0)
MCV: 87 fl (ref 78.0–100.0)
MONOS PCT: 8.9 % (ref 3.0–12.0)
Monocytes Absolute: 0.4 10*3/uL (ref 0.1–1.0)
Neutro Abs: 2.7 10*3/uL (ref 1.4–7.7)
Neutrophils Relative %: 56.9 % (ref 43.0–77.0)
Platelets: 172 10*3/uL (ref 150.0–400.0)
RBC: 5.38 Mil/uL (ref 4.22–5.81)
RDW: 13.8 % (ref 11.5–15.5)
WBC: 4.8 10*3/uL (ref 4.0–10.5)

## 2017-10-24 LAB — URINALYSIS, ROUTINE W REFLEX MICROSCOPIC
BILIRUBIN URINE: NEGATIVE
KETONES UR: NEGATIVE
LEUKOCYTES UA: NEGATIVE
NITRITE: NEGATIVE
PH: 5 (ref 5.0–8.0)
Total Protein, Urine: NEGATIVE
URINE GLUCOSE: NEGATIVE
UROBILINOGEN UA: 0.2 (ref 0.0–1.0)

## 2017-10-24 LAB — COMPREHENSIVE METABOLIC PANEL
ALBUMIN: 3.8 g/dL (ref 3.5–5.2)
ALK PHOS: 73 U/L (ref 39–117)
ALT: 22 U/L (ref 0–53)
AST: 17 U/L (ref 0–37)
BILIRUBIN TOTAL: 0.5 mg/dL (ref 0.2–1.2)
BUN: 17 mg/dL (ref 6–23)
CALCIUM: 9.5 mg/dL (ref 8.4–10.5)
CO2: 31 mEq/L (ref 19–32)
Chloride: 103 mEq/L (ref 96–112)
Creatinine, Ser: 1.02 mg/dL (ref 0.40–1.50)
GFR: 78.82 mL/min (ref >60.00)
Glucose, Bld: 99 mg/dL (ref 70–99)
POTASSIUM: 4.5 meq/L (ref 3.5–5.1)
Sodium: 140 mEq/L (ref 135–145)
TOTAL PROTEIN: 6.7 g/dL (ref 6.0–8.3)

## 2017-10-24 LAB — HEMOGLOBIN A1C: Hgb A1c MFr Bld: 5.6 % (ref 4.6–6.5)

## 2017-10-24 LAB — VITAMIN B12: VITAMIN B 12: 299 pg/mL (ref 211–911)

## 2017-10-24 LAB — LIPID PANEL
Cholesterol: 239 mg/dL — ABNORMAL HIGH (ref 0–200)
HDL: 41.9 mg/dL (ref >39.00)
NONHDL: 196.8
TRIGLYCERIDES: 227 mg/dL — AB (ref 0.0–149.0)
Total CHOL/HDL Ratio: 6
VLDL: 45.4 mg/dL — ABNORMAL HIGH (ref 0.0–40.0)

## 2017-10-24 LAB — PSA: PSA: 0.89 ng/mL (ref 0.10–4.00)

## 2017-10-24 LAB — LDL CHOLESTEROL, DIRECT: Direct LDL: 145 mg/dL

## 2017-10-24 NOTE — Patient Instructions (Addendum)
For you wellness exam today I have ordered cbc, cmp, tsh, lipid panel, ua and hiv. Include psa as well for screening.  Will get hep b study to follow carrier status.  Vaccine given today. tdap and psv-23.  Will get a1-c to follow sugar level.  Recommend exercise and healthy diet.  We will let you know lab results as they come in.  Follow up date appointment will be determined after lab review.    Preventive Care 40-64 Years, Male Preventive care refers to lifestyle choices and visits with your health care provider that can promote health and wellness. What does preventive care include?  A yearly physical exam. This is also called an annual well check.  Dental exams once or twice a year.  Routine eye exams. Ask your health care provider how often you should have your eyes checked.  Personal lifestyle choices, including: ? Daily care of your teeth and gums. ? Regular physical activity. ? Eating a healthy diet. ? Avoiding tobacco and drug use. ? Limiting alcohol use. ? Practicing safe sex. ? Taking low-dose aspirin every day starting at age 54. What happens during an annual well check? The services and screenings done by your health care provider during your annual well check will depend on your age, overall health, lifestyle risk factors, and family history of disease. Counseling Your health care provider may ask you questions about your:  Alcohol use.  Tobacco use.  Drug use.  Emotional well-being.  Home and relationship well-being.  Sexual activity.  Eating habits.  Work and work Statistician.  Screening You may have the following tests or measurements:  Height, weight, and BMI.  Blood pressure.  Lipid and cholesterol levels. These may be checked every 5 years, or more frequently if you are over 14 years old.  Skin check.  Lung cancer screening. You may have this screening every year starting at age 54 if you have a 30-pack-year history of smoking and  currently smoke or have quit within the past 15 years.  Fecal occult blood test (FOBT) of the stool. You may have this test every year starting at age 21.  Flexible sigmoidoscopy or colonoscopy. You may have a sigmoidoscopy every 5 years or a colonoscopy every 10 years starting at age 65.  Prostate cancer screening. Recommendations will vary depending on your family history and other risks.  Hepatitis C blood test.  Hepatitis B blood test.  Sexually transmitted disease (STD) testing.  Diabetes screening. This is done by checking your blood sugar (glucose) after you have not eaten for a while (fasting). You may have this done every 1-3 years.  Discuss your test results, treatment options, and if necessary, the need for more tests with your health care provider. Vaccines Your health care provider may recommend certain vaccines, such as:  Influenza vaccine. This is recommended every year.  Tetanus, diphtheria, and acellular pertussis (Tdap, Td) vaccine. You may need a Td booster every 10 years.  Varicella vaccine. You may need this if you have not been vaccinated.  Zoster vaccine. You may need this after age 39.  Measles, mumps, and rubella (MMR) vaccine. You may need at least one dose of MMR if you were born in 1957 or later. You may also need a second dose.  Pneumococcal 13-valent conjugate (PCV13) vaccine. You may need this if you have certain conditions and have not been vaccinated.  Pneumococcal polysaccharide (PPSV23) vaccine. You may need one or two doses if you smoke cigarettes or if you have  certain conditions.  Meningococcal vaccine. You may need this if you have certain conditions.  Hepatitis A vaccine. You may need this if you have certain conditions or if you travel or work in places where you may be exposed to hepatitis A.  Hepatitis B vaccine. You may need this if you have certain conditions or if you travel or work in places where you may be exposed to hepatitis  B.  Haemophilus influenzae type b (Hib) vaccine. You may need this if you have certain risk factors.  Talk to your health care provider about which screenings and vaccines you need and how often you need them. This information is not intended to replace advice given to you by your health care provider. Make sure you discuss any questions you have with your health care provider. Document Released: 09/05/2015 Document Revised: 04/28/2016 Document Reviewed: 06/10/2015 Elsevier Interactive Patient Education  [employer].

## 2017-10-24 NOTE — Progress Notes (Signed)
Subjective:    Patient ID: Richard Walker, male    DOB: March 11, 1956, 62 y.o.   MRN: [id number]  HPI  Pt in for CPE.  Pt is fasting.   Pt he skipped flu vaccine this year. He got the flu per his report.  We did still offer him the flu.  Pt will get tdap this year.  We don't have shingles vaccine.  Pt has hx of Atrial-flutter. He wants to get psv-23.  Also has lung illness which is currently being worked up fully by pulmonologist.  He is being followed by pulmonologist for dyspnea on exertion.  Some further testing this week.   Up to date on colonscopy until 2024.    Review of Systems  Constitutional: Positive for fatigue. Negative for chills and fever.  HENT: Negative for congestion, drooling, ear discharge and ear pain.   Eyes: Negative for photophobia, pain and redness.  Respiratory: Negative for cough, chest tightness, shortness of breath and wheezing.   Cardiovascular: Negative for chest pain and palpitations.  Gastrointestinal: Negative for abdominal distention, abdominal pain, blood in stool, constipation, diarrhea, nausea and vomiting.  Genitourinary: Negative for decreased urine volume, difficulty urinating, flank pain, frequency, hematuria and urgency.  Musculoskeletal: Negative for back pain, gait problem, neck pain and neck stiffness.  Skin: Negative for rash.  Neurological: Negative for dizziness, seizures, speech difficulty, weakness, light-headedness and headaches.  Hematological: Negative for adenopathy.  Psychiatric/Behavioral: Negative for behavioral problems and confusion. The patient is not nervous/anxious.     Past Medical History:  Diagnosis Date  . DDD (degenerative disc disease), lumbar    L3/L4  . Diverticulosis   . GERD (gastroesophageal reflux disease)   . Hyperlipidemia   . Low sperm motility   . Sleep apnea    uses cpap   . SVT (supraventricular tachycardia) (HCC)      Social History   Socioeconomic History  . Marital status: Married      Spouse name: Not on file  . Number of children: Not on file  . Years of education: Not on file  . Highest education level: Not on file  Social Needs  . Financial resource strain: Not on file  . Food insecurity - worry: Not on file  . Food insecurity - inability: Not on file  . Transportation needs - medical: Not on file  . Transportation needs - non-medical: Not on file  Occupational History  . Not on file  Tobacco Use  . Smoking status: Former Smoker    Packs/day: 1.00    Years: 10.00    Pack years: 10.00    Types: Cigarettes    Last attempt to quit: 1992    Years since quitting: 27.1  . Smokeless tobacco: Never Used  . Tobacco comment: Uses Hookah Pipe occasionally  Substance and Sexual Activity  . Alcohol use: No  . Drug use: No  . Sexual activity: Yes  Other Topics Concern  . 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     Alcohol Use:  None   Drug Use:  None   Diet:  Regular   Exercise:  None  Hobbies:  Cards    Past Surgical History:  Procedure Laterality Date  . BACK SURGERY    . CARDIAC CATHETERIZATION N/A 12/22/2015   Procedure: Left Heart Cath and Coronary Angiography;  Surgeon: Peter M Swaziland, MD;  Location: [facility] INVASIVE CV LAB;  Service: Cardiovascular;  Laterality: N/A;  . ELECTROPHYSIOLOGIC STUDY N/A 02/19/2016   Procedure: SVT Ablation;  Surgeon: Marinus Maw, MD;  Location: [facility] INVASIVE CV LAB;  Service: Cardiovascular;  Laterality: N/A;  . WISDOM TOOTH EXTRACTION      Family History  Problem Relation Age of Onset  . Hypertension Father   . Colon cancer Neg Hx   . Stomach cancer Neg Hx     Allergies   Allergen Reactions  . Pork-Derived Products Swelling  . Statins     Muscle aches    Current Outpatient Medications on File Prior to Visit  Medication Sig Dispense Refill  . albuterol (PROVENTIL HFA;VENTOLIN HFA) 108 (90 Base) MCG/ACT inhaler Inhale 2 puffs into the lungs every 6 (six) hours as needed for wheezing or shortness of breath. 1 Inhaler 2  . aspirin 81 MG tablet Take 81 mg by mouth daily.    . flecainide (TAMBOCOR) 50 MG tablet Take as needed for palpitations despite taking Metoprolol (do not exceed 300 mg in 24 hours) 45 tablet 3  . metoprolol tartrate (LOPRESSOR) 25 MG tablet Take 1 tablet (25 mg total) by mouth 2 (two) times daily. 180 tablet 3  . pantoprazole (PROTONIX) 40 MG tablet Take 1 tablet (40 mg total) by mouth 2 (two) times daily before a meal. 60 tablet 2  . PRESCRIPTION MEDICATION Inhale into the lungs at bedtime. CPAP    . sildenafil (REVATIO) 20 MG tablet Take 1-2 tablets (20-40 mg total) by mouth as needed. 60 tablet 1   No current facility-administered medications on file prior to visit.     BP 112/77   Pulse 67   Temp 97.7 F (36.5 C) (Oral)   Resp 16   Ht 5\' 10"  (1.778 m)   Wt 267 lb 3.2 oz (121.2 kg)   SpO2 100%   BMI 38.34 kg/m        Objective:   Physical Exam   General Mental Status- Alert. General Appearance- Not in acute distress.   Skin General: Color- Normal Color. Moisture- Normal Moisture.  No worrisome lesions on inspection.  Although he does have some skin tags underneath axillary areas.  Neck Carotid Arteries- Normal color. Moisture- Normal Moisture. No carotid bruits. No JVD.  Chest and Lung Exam Auscultation: Breath Sounds:-Normal.  Cardiovascular Auscultation:Rythm- Regular. Murmurs & Other Heart Sounds:Auscultation of the heart reveals- No Murmurs.  Abdomen Inspection:-Inspeection Normal. Palpation/Percussion:Note:No mass. Palpation and Percussion of the abdomen reveal- Non Tender, Non Distended + BS, no rebound  or guarding.  HEENT-negative.  No sinus pressure.  No boggy turbinates.  Throat exam looks normal.  No submandibular node lymphadenopathy.  Neurologic Cranial Nerve exam:- CN III-XII intact(No nystagmus), symmetric smile. Strength:- 5/5 equal and symmetric strength both upper and lower extremities.  Rectal exam- declined by pt. Genital exam- declined by pt.     Assessment & Plan:  For you wellness exam today I have ordered cbc, cmp, tsh, lipid panel, ua and hiv. Include psa as well for screening.  Will get hep b study to follow carrier status.  Vaccine given today. tdap and psv-23.  Will get a1-c to follow sugar level.  Recommend exercise and healthy diet.  We will let you know lab  results as they come in.  Follow up date appointment will be determined after lab review.

## 2017-10-24 NOTE — Telephone Encounter (Signed)
Referral to urologist placed. 

## 2017-10-25 ENCOUNTER — Telehealth: Payer: Self-pay | Admitting: Pulmonary Disease

## 2017-10-25 NOTE — Telephone Encounter (Signed)
HRCT chest 10/21/17 >> atherosclerosis, scarring RML and lingula, hepatic steatosis   Please let him know his CT chest shows scarring in his lungs from prior infection.  No change to current management.  Will discuss in more detail at his next visit.

## 2017-10-25 NOTE — Telephone Encounter (Signed)
Gwen has completed.

## 2017-10-26 ENCOUNTER — Telehealth: Payer: Self-pay | Admitting: Medical

## 2017-10-26 MED ORDER — EZETIMIBE 10 MG PO TABS: 10.0000 mg | ORAL_TABLET | Freq: Every day | ORAL | 3 refills | Status: DC

## 2017-10-26 NOTE — Telephone Encounter (Signed)
Prescription of Zetia sent to patient's pharmacy.

## 2017-10-26 NOTE — Telephone Encounter (Signed)
Unable to reach patient, left message on phone to give us a call back. Called patients spouse number and she states that she will have him give us a call back.

## 2017-10-27 NOTE — Telephone Encounter (Signed)
Pt returned call for results CB [phone]

## 2017-10-27 NOTE — Telephone Encounter (Signed)
Advised pt of results. Pt understood and nothing further is needed.   

## 2017-10-28 ENCOUNTER — Ambulatory Visit: Payer: Medicaid Other | Admitting: Medical

## 2017-10-28 LAB — VITAMIN B1: VITAMIN B1 (THIAMINE): 15 nmol/L (ref 8–30)

## 2017-10-28 LAB — HEPATITIS B DNA, ULTRAQUANTITATIVE, PCR: Hepatitis B DNA: <10 IU/mL

## 2017-10-28 LAB — HIV ANTIBODY (ROUTINE TESTING W REFLEX): HIV 1&2 Ab, 4th Generation: NONREACTIVE

## 2017-10-31 ENCOUNTER — Ambulatory Visit (INDEPENDENT_AMBULATORY_CARE_PROVIDER_SITE_OTHER): Payer: Medicaid Other | Admitting: Pulmonary Disease

## 2017-10-31 ENCOUNTER — Ambulatory Visit: Payer: Medicaid Other | Admitting: Adult Health

## 2017-10-31 ENCOUNTER — Encounter: Payer: Self-pay | Admitting: Adult Health

## 2017-10-31 DIAGNOSIS — R0600 Dyspnea, unspecified: Secondary | ICD-10-CM | POA: Insufficient documentation

## 2017-10-31 DIAGNOSIS — R0602 Shortness of breath: Secondary | ICD-10-CM | POA: Diagnosis not present

## 2017-10-31 DIAGNOSIS — R05 Cough: Secondary | ICD-10-CM

## 2017-10-31 DIAGNOSIS — Z87891 Personal history of nicotine dependence: Secondary | ICD-10-CM

## 2017-10-31 DIAGNOSIS — R059 Cough, unspecified: Secondary | ICD-10-CM | POA: Insufficient documentation

## 2017-10-31 DIAGNOSIS — R053 Chronic cough: Secondary | ICD-10-CM

## 2017-10-31 DIAGNOSIS — R0609 Other forms of dyspnea: Secondary | ICD-10-CM

## 2017-10-31 LAB — PULMONARY FUNCTION TEST
DL/VA % PRED: 122 %
DL/VA: 5.68 ml/min/mmHg/L
DLCO UNC % PRED: 77 %
DLCO UNC: 25.17 ml/min/mmHg
FEF 25-75 Pre: 2.4 L/sec
FEF2575-%Pred-Pre: 82 %
FEV1-%Pred-Pre: 64 %
FEV1-Pre: 2.29 L
FEV1FVC-%PRED-PRE: 110 %
FEV6-%PRED-PRE: 61 %
FEV6-PRE: 2.77 L
FEV6FVC-%PRED-PRE: 105 %
FVC-%Pred-Pre: 58 %
FVC-PRE: 2.77 L
PRE FEV6/FVC RATIO: 100 %
Pre FEV1/FVC ratio: 83 %
RV % pred: 96 %
RV: 2.19 L
TLC % PRED: 79 %
TLC: 5.59 L

## 2017-10-31 NOTE — Progress Notes (Signed)
 @Patient  ID: Parthenia AmesSam F Derego, male    DOB: Jul 11, 1956, 62 y.o.   MRN: [id number]  Chief Complaint  Patient presents with  . Follow-up    Dsypnea     Referring provider: Marisue BrooklynSaguier, Edward, PA-C  HPI: 62 year old male former smoker seen for pulmonary consult October 14, 2017 for dyspnea Past medical history significant for severe sleep apnea on CPAP  TEST  Sleep tests: PSG 12/29/15 >> AHI 69.8, SpO2 low 89%  Cardiac tests: Echo 04/01/16 >> mild LVH, EF 55 to 60%  10/31/2017 Follow up: Dyspnea  Patient returns for a 2-week follow-up.  Patient was seen last visit for pulmonary consult for ongoing dyspnea.  Has associated intermittent cough.  Of note patient had recently had influenza A Chest x-ray showed right middle lobe scarring He was set up for a high resolution CT chest that showed no evid mild postinfectious scarring in the right middle lobe and lingula ence of interstitial lung disease. Pulmonary function testing today shows moderate restriction with FEV1 at 64%, ratio 83, FVC 58%, mid flows 82%, DLCO 77%.. He is feeling much better. Cough has resolved. Dyspnea is getting some better. Still gets winded , especially stairs, prolonged walking .  He denies any chest pain. Had cardiac cath 2017 that showed mild nonobstructive coronary disease, normal EF. Wt is up 20lbs.     Allergies  Allergen Reactions  . Pork-Derived Products Swelling  . Statins     Muscle aches    Immunization History  Administered Date(s) Administered  . Pneumococcal Polysaccharide-23 10/24/2017  . Td 09/15/2001  . Tdap 06/16/2007, 10/24/2017  . Zoster 04/19/2012    Past Medical History:  Diagnosis Date  . DDD (degenerative disc disease), lumbar    L3/L4  . Diverticulosis   . GERD (gastroesophageal reflux disease)   . Hyperlipidemia   . Low sperm motility   . Sleep apnea    uses cpap   . SVT (supraventricular tachycardia) (HCC)     Tobacco History: Social History   Tobacco Use  Smoking  Status Former Smoker  . Packs/day: 1.00  . Years: 10.00  . Pack years: 10.00  . Types: Cigarettes  . Last attempt to quit: 1992  . Years since quitting: 27.2  Smokeless Tobacco Never Used  Tobacco Comment   Uses Hookah Pipe occasionally   Counseling given: Not Answered Comment: Uses Hookah Pipe occasionally   Outpatient Encounter Medications as of 10/31/2017  Medication Sig  . albuterol (PROVENTIL HFA;VENTOLIN HFA) 108 (90 Base) MCG/ACT inhaler Inhale 2 puffs into the lungs every 6 (six) hours as needed for wheezing or shortness of breath.  [name]. aspirin 81 MG tablet Take 81 mg by mouth daily.  [name]. ezetimibe (ZETIA) 10 MG tablet Take 1 tablet (10 mg total) by mouth daily.  . flecainide (TAMBOCOR) 50 MG tablet Take as needed for palpitations despite taking Metoprolol (do not exceed 300 mg in 24 hours)  . metoprolol tartrate (LOPRESSOR) 25 MG tablet Take 1 tablet (25 mg total) by mouth 2 (two) times daily.  . pantoprazole (PROTONIX) 40 MG tablet Take 1 tablet (40 mg total) by mouth 2 (two) times daily before a meal.  . PRESCRIPTION MEDICATION Inhale into the lungs at bedtime. CPAP  . sildenafil (REVATIO) 20 MG tablet Take 1-2 tablets (20-40 mg total) by mouth as needed. (Patient not taking: Reported on 10/31/2017)   No facility-administered encounter medications on file as of 10/31/2017.      Review of Systems  Constitutional:   No  weight loss, night sweats,  Fevers, chills, + fatigue, or  lassitude.  HEENT:   No headaches,  Difficulty swallowing,  Tooth/dental problems, or  Sore throat,                No sneezing, itching, ear ache, nasal congestion, post nasal drip,   CV:  No chest pain,  Orthopnea, PND, swelling in lower extremities, anasarca, dizziness, palpitations, syncope.   GI  No heartburn, indigestion, abdominal pain, nausea, vomiting, diarrhea, change in bowel habits, loss of appetite, bloody stools.   Resp:  .  No excess mucus, no productive cough,  No non-productive  cough,  No coughing up of blood.  No change in color of mucus.  No wheezing.  No chest wall deformity  Skin: no rash or lesions.  GU: no dysuria, change in color of urine, no urgency or frequency.  No flank pain, no hematuria   MS:  No joint pain or swelling.  No decreased range of motion.  No back pain.    Physical Exam  BP 126/84 (BP Location: Left Arm, Cuff Size: Normal)   Pulse 62   Ht 5\' 10"  (1.778 m)   Wt 263 lb (119.3 kg)   SpO2 96%   BMI 37.74 kg/m   GEN: A/Ox3; pleasant , NAD, obese    HEENT:  [place]/AT,  EACs-clear, TMs-wnl, NOSE-clear, THROAT-clear, no lesions, no postnasal drip or exudate noted.   NECK:  Supple w/ fair ROM; no JVD; normal carotid impulses w/o bruits; no thyromegaly or nodules palpated; no lymphadenopathy.    RESP  Clear  P & A; w/o, wheezes/ rales/ or rhonchi. no accessory muscle use, no dullness to percussion  CARD:  RRR, no m/r/g, no peripheral edema, pulses intact, no cyanosis or clubbing.  GI:   Soft & nt; nml bowel sounds; no organomegaly or masses detected.   Musco: Warm bil, no deformities or joint swelling noted.   Neuro: alert, no focal deficits noted.    Skin: Warm, no lesions or rashes    Lab Results:  CBC    Component Value Date/Time   WBC 4.8 10/24/2017 1023   RBC 5.38 10/24/2017 1023   HGB 15.6 10/24/2017 1023   HCT 46.8 10/24/2017 1023   PLT 172.0 10/24/2017 1023   MCV 87.0 10/24/2017 1023   MCH 28.7 02/12/2016 1508   MCHC 33.4 10/24/2017 1023   RDW 13.8 10/24/2017 1023   LYMPHSABS 1.5 10/24/2017 1023   MONOABS 0.4 10/24/2017 1023   EOSABS 0.1 10/24/2017 1023   BASOSABS 0.0 10/24/2017 1023    BMET    Component Value Date/Time   NA 140 10/24/2017 1023   K 4.5 10/24/2017 1023   CL 103 10/24/2017 1023   CO2 31 10/24/2017 1023   GLUCOSE 99 10/24/2017 1023   BUN 17 10/24/2017 1023   CREATININE 1.02 10/24/2017 1023   CREATININE 1.18 02/12/2016 1508   CALCIUM 9.5 10/24/2017 1023   GFRNONAA >60 01/30/2016 0910    GFRNONAA >89 03/04/2014 1456   GFRAA >60 01/30/2016 0910   GFRAA >89 03/04/2014 1456    BNP No results found for: BNP  ProBNP No results found for: PROBNP  Imaging: Ct Chest High Resolution  Result Date: 10/21/2017 CLINICAL DATA:  Chronic dyspnea and cough.  Reported former smoker. EXAM: CT CHEST WITHOUT CONTRAST TECHNIQUE: Multidetector CT imaging of the chest was performed following the standard protocol without intravenous contrast. High resolution imaging of the lungs, as well as inspiratory and expiratory imaging, was performed. COMPARISON:  09/26/2017 chest radiograph. FINDINGS: Cardiovascular: Normal heart size. No significant pericardial fluid/thickening. Three-vessel coronary atherosclerosis. Atherosclerotic nonaneurysmal thoracic aorta. Normal caliber pulmonary arteries. Mediastinum/Nodes: No discrete thyroid nodules. Unremarkable esophagus. No pathologically enlarged axillary, mediastinal or gross hilar lymph nodes, noting limited sensitivity for the detection of hilar adenopathy on this noncontrast study. Lungs/Pleura: No pneumothorax. No pleural effusion. No acute consolidative airspace disease, lung masses or significant pulmonary nodules. No significant air trapping on the expiration sequence. Small parenchymal bands in right middle lobe and lingula are compatible with mild postinfectious/postinflammatory scarring. No significant regions of subpleural reticulation, ground-glass attenuation, traction bronchiectasis, architectural distortion or frank honeycombing. Upper abdomen: Diffuse hepatic steatosis. Partially visualized simple appearing 3.2 cm posterior upper left renal cyst. Musculoskeletal: No aggressive appearing focal osseous lesions. Mild thoracic spondylosis. IMPRESSION: 1. No evidence of interstitial lung disease. 2. Mild postinfectious/postinflammatory scarring in right middle lobe and lingula. 3. Three-vessel coronary atherosclerosis. 4. Diffuse hepatic steatosis. Aortic  Atherosclerosis (ICD10-I70.0). Electronically Signed   By: Delbert Phenix M.D.   On: 10/21/2017 14:48     Assessment & Plan:   Dyspnea DOE  ? Etiology - workup has been unrevealing with minimal scarring on HRCT chest (NO ILD )  Labs neg.  PFT show moderate restriction possibly secondary to obesity.  Patient's O2 saturation is normal on room air.  And DLCO is minimally decreased at 77%. Patient's weight is went up 20 pounds over the last 6 months. Encouraged on weight loss.  If symptoms worsen or do not improve can look at Methacoline challenge test although cough has resolved. No wheezing . And/Or CPST .   Plan  Patient Instructions  Use Albuterol inhaler 2 puffs every 4hr as needed.  Activity as tolerated.  Work on weight loss.  Discussed with your primary care physician that your CT showed a fatty liver and plaque buildup in your coronary arteries. Follow up with Dr. Craige Cotta  In 4-6 months and As needed         Cough Post viral cough - now resolved after recent influenza .       Rubye Oaks, NP 10/31/2017

## 2017-10-31 NOTE — Patient Instructions (Addendum)
Use Albuterol inhaler 2 puffs every 4hr as needed.  Activity as tolerated.  Work on weight loss.  Discussed with your primary care physician that your CT showed a fatty liver and plaque buildup in your coronary arteries. Follow up with Dr. Craige CottaSood  In 4-6 months and As needed

## 2017-10-31 NOTE — Assessment & Plan Note (Signed)
DOE  ? Etiology - workup has been unrevealing with minimal scarring on HRCT chest (NO ILD )  Labs neg.  PFT show moderate restriction possibly secondary to obesity.  Patient's O2 saturation is normal on room air.  And DLCO is minimally decreased at 77%. Patient's weight is went up 20 pounds over the last 6 months. Encouraged on weight loss.  If symptoms worsen or do not improve can look at Methacoline challenge test although cough has resolved. No wheezing . And/Or CPST .   Plan  Patient Instructions  Use Albuterol inhaler 2 puffs every 4hr as needed.  Activity as tolerated.  Work on weight loss.  Discussed with your primary care physician that your CT showed a fatty liver and plaque buildup in your coronary arteries. Follow up with Dr. Craige CottaSood  In 4-6 months and As needed

## 2017-10-31 NOTE — Progress Notes (Signed)
PFT completed today.  

## 2017-10-31 NOTE — Assessment & Plan Note (Signed)
Post viral cough - now resolved after recent influenza .

## 2017-11-03 ENCOUNTER — Ambulatory Visit: Payer: Medicaid Other | Admitting: Allergy and Immunology

## 2017-11-09 ENCOUNTER — Encounter: Payer: Self-pay | Admitting: Allergy and Immunology

## 2017-11-09 ENCOUNTER — Ambulatory Visit (INDEPENDENT_AMBULATORY_CARE_PROVIDER_SITE_OTHER): Payer: Medicaid Other | Admitting: Allergy and Immunology

## 2017-11-09 VITALS — BP 120/72 | HR 70 | Temp 97.8°F | Resp 20 | Ht 68.0 in | Wt 259.3 lb

## 2017-11-09 DIAGNOSIS — Z91018 Allergy to other foods: Secondary | ICD-10-CM | POA: Diagnosis not present

## 2017-11-09 DIAGNOSIS — R062 Wheezing: Secondary | ICD-10-CM | POA: Diagnosis not present

## 2017-11-09 DIAGNOSIS — R0609 Other forms of dyspnea: Secondary | ICD-10-CM

## 2017-11-09 DIAGNOSIS — J3089 Other allergic rhinitis: Secondary | ICD-10-CM | POA: Diagnosis not present

## 2017-11-09 MED ORDER — MONTELUKAST SODIUM 10 MG PO TABS: ORAL_TABLET | ORAL | 5 refills | Status: DC

## 2017-11-09 MED ORDER — AZELASTINE HCL 0.1 % NA SOLN: NASAL | 5 refills | Status: DC

## 2017-11-09 NOTE — Progress Notes (Signed)
New Patient Note  RE: STANISLAUS Walker MRN: [id number] DOB: February 11, 1956 Date of Office Visit: 11/09/2017  Referring provider: Marisue Brooklyn Primary care provider: Esperanza Richters, PA-C  Chief Complaint: Breathing Problem   History of present illness: Richard Walker is a 62 y.o. male seen today in consultation requested by Esperanza Richters, PA-C.  He complains of dyspnea with exertion.  He states that he lost his job in October 2018 and has gained approximately 35 pounds due to decreased activity level.  However, he is concerned that climbing 1 flight of stairs leaves him dyspneic.  He claims to experience relief approximately for 5 minutes after taking albuterol. He experienced wheezing approximately 2 weeks ago while he had the flu.  Chest x-ray was performed and apparently there was some scarring found on the left side.  He has a history of SVTs which were corrected with ablation.  Nadav experiences nasal congestion and thick postnasal drainage, particularly in the morning.  He underwent septoplasty and bilateral turbinate reduction in December 2017.  He currently takes fluticasone nasal spray as needed in an attempt to control his nasal symptoms.  No significant seasonal symptom variation has been noted nor have specific environmental triggers been identified. Francis complains of a burning sensation and pressure in his chest when he consumes certain foods, including raw onions and garlic.  He reports that consuming milk and/or beans causes bloating and gas.   Assessment and plan: Perennial and seasonal allergic rhinitis  Aeroallergen avoidance measures have been discussed and provided in written form.  A prescription has been provided for montelukast 10 mg daily at bedtime.  A prescription has been provided for azelastine nasal spray, 1-2 sprays per nostril 2 times daily as needed. Proper nasal spray technique has been discussed and demonstrated.   Nasal saline spray (i.e., Simply Saline)  or nasal saline lavage (i.e., NeilMed) is recommended as needed and prior to medicated nasal sprays.  For thick post nasal drainage, add guaifenesin [phone] mg (Mucinex)  twice daily as needed with adequate hydration as discussed.  Dyspnea/wheezing Spirometry today revealed 250 mL (9%) postbronchodilator improvement while asymptomatic.  While this is not diagnostic for asthma, it does suggest the possibility for bronchial hyperresponsiveness, again as the patient was asymptomatic while performing the study.  For now, continue albuterol HFA, 1 to 2 inhalations every 6 hours if needed and 15 minutes prior to exercise.  Montelukast has been prescribed for allergic rhinitis, this medication should also decrease lower respiratory inflammation, if present.  Continue weight loss efforts.  If symptoms persist or progress, further evaluation by cardiology may be warranted, particularly if symptoms do not readily improve with albuterol.  History of food allergy Skin tests to select food allergens were negative today. The negative predictive value of food allergen skin testing is excellent (approximately 95%). While this does not appear to be an IgE mediated issue, skin testing does not rule out food intolerances or cell-mediated enteropathies which may lend to GI symptoms. These etiologies are suggested when elimination of the responsible food leads to symptom resolution and re-introduction of the food is followed by the return of symptoms.   The patient has been encouraged to keep a careful symptom/food journal and eliminate any food suspected of correlating with symptoms. Should symptoms concerning for anaphylaxis arise, 911 is to be called immediately.  If GI symptoms persist or progress, gastroenterologist evaluation may be warranted.   Meds ordered this encounter  Medications  . montelukast (SINGULAIR) 10 MG tablet  Sig: Take 1 tablet once at night for coughing or wheezing.    Dispense:  30  tablet    Refill:  5  . azelastine (ASTELIN) 0.1 % nasal spray    Sig: 1-2 sprays per nostril 2 times daily as needed.    Dispense:  30 mL    Refill:  5    Diagnostics: Spirometry: FVC was 3.49 L and FEV1 was 2.79 L (90% predicted) with 250 mL (9%) postbronchodilator improvement.  This study was performed while the patient was asymptomatic.  Please see scanned spirometry results for details. Epicutaneous testing: Positive to grass pollen, weed pollen, and ragweed pollen. Intradermal testing: Positive to grass pollen, tree pollen, cockroach antigen, and dust mite antigen. Food allergen skin testing: Negative despite a positive histamine control.    Physical examination: Blood pressure 120/72, pulse 70, temperature 97.8 F (36.6 C), temperature source Oral, resp. rate 20, height 5\' 8"  (1.727 m), weight 259 lb 4.2 oz (117.6 kg), SpO2 97 %.  General: Alert, interactive, in no acute distress. HEENT: TMs pearly gray, turbinates minimally edematous with thick discharge, post-pharynx moderately erythematous. Neck: Supple without lymphadenopathy. Lungs: Clear to auscultation without wheezing, rhonchi or rales. CV: Normal S1, S2 without murmurs. Abdomen: Nondistended, nontender. Skin: Warm and dry, without lesions or rashes. Extremities:  No clubbing, cyanosis or edema. Neuro:   Grossly intact.  Review of systems:  Review of systems negative except as noted in HPI / PMHx or noted below: Review of Systems  Constitutional: Negative.   HENT: Negative.   Eyes: Negative.   Respiratory: Negative.   Cardiovascular: Negative.   Gastrointestinal: Negative.   Genitourinary: Negative.   Musculoskeletal: Negative.   Skin: Negative.   Neurological: Negative.   Endo/Heme/Allergies: Negative.   Psychiatric/Behavioral: Negative.     Past medical history:  Past Medical History:  Diagnosis Date  . DDD (degenerative disc disease), lumbar    L3/L4  . Diverticulosis   . GERD (gastroesophageal  reflux disease)   . Hyperlipidemia   . Low sperm motility   . Sleep apnea    uses cpap   . SVT (supraventricular tachycardia) (HCC)     Past surgical history:  Past Surgical History:  Procedure Laterality Date  . BACK SURGERY    . CARDIAC CATHETERIZATION N/A 12/22/2015   Procedure: Left Heart Cath and Coronary Angiography;  Surgeon: Peter M SwazilandJordan, MD;  [facility];  Service: Cardiovascular;  Laterality: N/A;  . CARDIAC ELECTROPHYSIOLOGY STUDY AND ABLATION    . ELECTROPHYSIOLOGIC STUDY N/A 02/19/2016   Procedure: SVT Ablation;  Surgeon: Marinus MawGregg W Taylor, MD;  [facility];  Service: Cardiovascular;  Laterality: N/A;  . septal reconstruction  2017  . TURBINATE REDUCTION Bilateral 07/2016  . WISDOM TOOTH EXTRACTION      Family history: Family History  Problem Relation Age of Onset  . Hypertension Father   . Food Allergy Son   . Colon cancer Neg Hx   . Stomach cancer Neg Hx   . Allergic rhinitis Neg Hx   . Angioedema Neg Hx   . Asthma Neg Hx   . Eczema Neg Hx   . Immunodeficiency Neg Hx   . Urticaria Neg Hx     Social history: Social History   Socioeconomic History  . Marital status: Married    Spouse name: Not on file  . Number of children: Not on file  . Years of education: Not on file  . Highest education level: Not on file  Social Needs  . Financial resource strain: Not on file  . Food insecurity - worry: Not on file  . Food insecurity - inability: Not on file  . Transportation needs - medical: Not on file  . Transportation needs - non-medical: Not on file  Occupational History  . Not on file  Tobacco Use  . Smoking status: Former Smoker    Packs/day: 1.00    Years: 10.00    Pack years: 10.00    Types: Cigarettes    Last attempt to quit: 1992    Years since quitting: 27.2  . Smokeless tobacco: Never Used  . Tobacco comment: Uses Hookah Pipe occasionally  Substance and Sexual Activity  . Alcohol use: No  . Drug use: No  .  Sexual activity: Yes  Other Topics Concern  . Not on file  Social History Narrative   Marital Status: Married Engineer, drilling)   Children:  4 (3 Sons/1 Daughter)   Pets:  None    Living Situation: Lives with spouse and 4 children   Origin:  He was born in [place].   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     Alcohol Use:  None   Drug Use:  None   Diet:  Regular   Exercise:  None   Hobbies:  Cards   Environmental History: The patient lives in a 62 year old house with carpeting in the bedroom, gas heat, and central air.  He is a non-smoker without pets.  There is no known mold/water damage in the home.  Allergies as of 11/09/2017      Reactions   Pork-derived Products Swelling   Statins    Muscle aches      Medication List        Accurate as of 11/09/17  4:35 PM. Always use your most recent med list.          aspirin 81 MG tablet Take 81 mg by mouth daily.   azelastine 0.1 % nasal spray Commonly known as:  ASTELIN 1-2 sprays per nostril 2 times daily as needed.   ezetimibe 10 MG tablet Commonly known as:  ZETIA Take 1 tablet (10 mg total) by mouth daily.   flecainide 50 MG tablet Commonly known as:  TAMBOCOR Take as needed for palpitations despite taking Metoprolol (do not exceed 300 mg in 24 hours)   metoprolol tartrate 25 MG tablet Commonly known as:  LOPRESSOR Take 1 tablet (25 mg total) by mouth 2 (two) times daily.   montelukast 10 MG tablet Commonly known as:  SINGULAIR Take 1 tablet once at night for coughing or wheezing.   pantoprazole 40 MG tablet Commonly known as:  PROTONIX Take 1 tablet (40 mg total) by mouth 2 (two) times daily before a meal.   PRESCRIPTION MEDICATION Inhale into the lungs at bedtime. CPAP   PROAIR HFA IN Inhale into the lungs.   sildenafil 20 MG tablet Commonly known as:  REVATIO Take 1-2 tablets  (20-40 mg total) by mouth as needed.       Known medication allergies: Allergies  Allergen Reactions  . Pork-Derived Products Swelling  . Statins     Muscle aches    I appreciate the opportunity to take part in Woody's care. Please do not hesitate to contact me with questions.  Sincerely,   R. Jorene Guest, MD

## 2017-11-09 NOTE — Patient Instructions (Addendum)
Perennial and seasonal allergic rhinitis  Aeroallergen avoidance measures have been discussed and provided in written form.  A prescription has been provided for montelukast 10 mg daily at bedtime.  A prescription has been provided for azelastine nasal spray, 1-2 sprays per nostril 2 times daily as needed. Proper nasal spray technique has been discussed and demonstrated.   Nasal saline spray (i.e., Simply Saline) or nasal saline lavage (i.e., NeilMed) is recommended as needed and prior to medicated nasal sprays.  For thick post nasal drainage, add guaifenesin [phone] mg (Mucinex)  twice daily as needed with adequate hydration as discussed.  Dyspnea/wheezing Spirometry today revealed 250 mL (9%) postbronchodilator improvement while asymptomatic.  While this is not diagnostic for asthma, it does suggest the possibility for bronchial hyperresponsiveness, again as the patient was asymptomatic while performing the study.  For now, continue albuterol HFA, 1 to 2 inhalations every 6 hours if needed and 15 minutes prior to exercise.  Montelukast has been prescribed for allergic rhinitis, this medication should also decrease lower respiratory inflammation, if present.  Continue weight loss efforts.  If symptoms persist or progress, further evaluation by cardiology may be warranted, particularly if symptoms do not readily improve with albuterol.  History of food allergy Skin tests to select food allergens were negative today. The negative predictive value of food allergen skin testing is excellent (approximately 95%). While this does not appear to be an IgE mediated issue, skin testing does not rule out food intolerances or cell-mediated enteropathies which may lend to GI symptoms. These etiologies are suggested when elimination of the responsible food leads to symptom resolution and re-introduction of the food is followed by the return of symptoms.   The patient has been encouraged to keep a  careful symptom/food journal and eliminate any food suspected of correlating with symptoms. Should symptoms concerning for anaphylaxis arise, 911 is to be called immediately.  If GI symptoms persist or progress, gastroenterologist evaluation may be warranted.   Return in about 3 months (around 02/09/2018), or if symptoms worsen or fail to improve.  Control of House Dust Mite Allergen  House dust mites play a major role in allergic asthma and rhinitis.  They occur in environments with high humidity wherever human skin, the food for dust mites is found. High levels have been detected in dust obtained from mattresses, pillows, carpets, upholstered furniture, bed covers, clothes and soft toys.  The principal allergen of the house dust mite is found in its feces.  A gram of dust may contain 1,000 mites and 250,000 fecal particles.  Mite antigen is easily measured in the air during house cleaning activities.    1. Encase mattresses, including the box spring, and pillow, in an air tight cover.  Seal the zipper end of the encased mattresses with wide adhesive tape. 2. Wash the bedding in water of 130 degrees Farenheit weekly.  Avoid cotton comforters/quilts and flannel bedding: the most ideal bed covering is the dacron comforter. 3. Remove all upholstered furniture from the bedroom. 4. Remove carpets, carpet padding, rugs, and non-washable window drapes from the bedroom.  Wash drapes weekly or use plastic window coverings. 5. Remove all non-washable stuffed toys from the bedroom.  Wash stuffed toys weekly. 6. Have the room cleaned frequently with a vacuum cleaner and a damp dust-mop.  The patient should not be in a room which is being cleaned and should wait 1 hour after cleaning before going into the room. 7. Close and seal all heating outlets in the bedroom.  Otherwise, the room will become filled with dust-laden air.  An electric heater can be used to heat the room. 8. Reduce indoor humidity to less  than 50%.  Do not use a humidifier.  Reducing Pollen Exposure  The American Academy of Allergy, Asthma and Immunology suggests the following steps to reduce your exposure to pollen during allergy seasons.    1. Do not hang sheets or clothing out to dry; pollen may collect on these items. 2. Do not mow lawns or spend time around freshly cut grass; mowing stirs up pollen. 3. Keep windows closed at night.  Keep car windows closed while driving. 4. Minimize morning activities outdoors, a time when pollen counts are usually at their highest. 5. Stay indoors as much as possible when pollen counts or humidity is high and on windy days when pollen tends to remain in the air longer. 6. Use air conditioning when possible.  Many air conditioners have filters that trap the pollen spores. 7. Use a HEPA room air filter to remove pollen form the indoor air you breathe.   Control of Cockroach Allergen  Cockroach allergen has been identified as an important cause of acute attacks of asthma, especially in urban settings.  There are fifty-five species of cockroach that exist in the [place], however only three, the [place], [place] species produce allergen that can affect patients with Asthma.  Allergens can be obtained from fecal particles, egg casings and secretions from cockroaches.    1. Remove food sources. 2. Reduce access to water. 3. Seal access and entry points. 4. Spray runways with 0.5-1% Diazinon or Chlorpyrifos 5. Blow boric acid power under stoves and refrigerator. 6. Place bait stations (hydramethylnon) at feeding sites.

## 2017-11-09 NOTE — Assessment & Plan Note (Addendum)
Skin tests to select food allergens were negative today. The negative predictive value of food allergen skin testing is excellent (approximately 95%). While this does not appear to be an IgE mediated issue, skin testing does not rule out food intolerances or cell-mediated enteropathies which may lend to GI symptoms. These etiologies are suggested when elimination of the responsible food leads to symptom resolution and re-introduction of the food is followed by the return of symptoms.   The patient has been encouraged to keep a careful symptom/food journal and eliminate any food suspected of correlating with symptoms. Should symptoms concerning for anaphylaxis arise, 911 is to be called immediately.  If GI symptoms persist or progress, gastroenterologist evaluation may be warranted. 

## 2017-11-09 NOTE — Assessment & Plan Note (Addendum)
   Aeroallergen avoidance measures have been discussed and provided in written form.  A prescription has been provided for montelukast 10 mg daily at bedtime.  A prescription has been provided for azelastine nasal spray, 1-2 sprays per nostril 2 times daily as needed. Proper nasal spray technique has been discussed and demonstrated.   Nasal saline spray (i.e., Simply Saline) or nasal saline lavage (i.e., NeilMed) is recommended as needed and prior to medicated nasal sprays.  For thick post nasal drainage, add guaifenesin 600-1200 mg (Mucinex)  twice daily as needed with adequate hydration as discussed. 

## 2017-11-09 NOTE — Assessment & Plan Note (Addendum)
Spirometry today revealed 250 mL (9%) postbronchodilator improvement while asymptomatic.  While this is not diagnostic for asthma, it does suggest the possibility for bronchial hyperresponsiveness, again as the patient was asymptomatic while performing the study.  For now, continue albuterol HFA, 1 to 2 inhalations every 6 hours if needed and 15 minutes prior to exercise.  Montelukast has been prescribed for allergic rhinitis, this medication should also decrease lower respiratory inflammation, if present.  Continue weight loss efforts.  If symptoms persist or progress, further evaluation by cardiology may be warranted, particularly if symptoms do not readily improve with albuterol.

## 2017-11-21 ENCOUNTER — Other Ambulatory Visit: Payer: Self-pay | Admitting: Physician Assistant

## 2017-11-21 ENCOUNTER — Other Ambulatory Visit: Payer: Self-pay | Admitting: Gastroenterology

## 2017-12-03 IMAGING — DX DG CHEST 2V
2 series · 2 of 2 positions shown · non-contrast
Comparison: 01/30/2016.

CLINICAL DATA: Shortness of breath.  Chest pain.  Former smoker.

EXAM:
CHEST  2 VIEW

[chest pa]
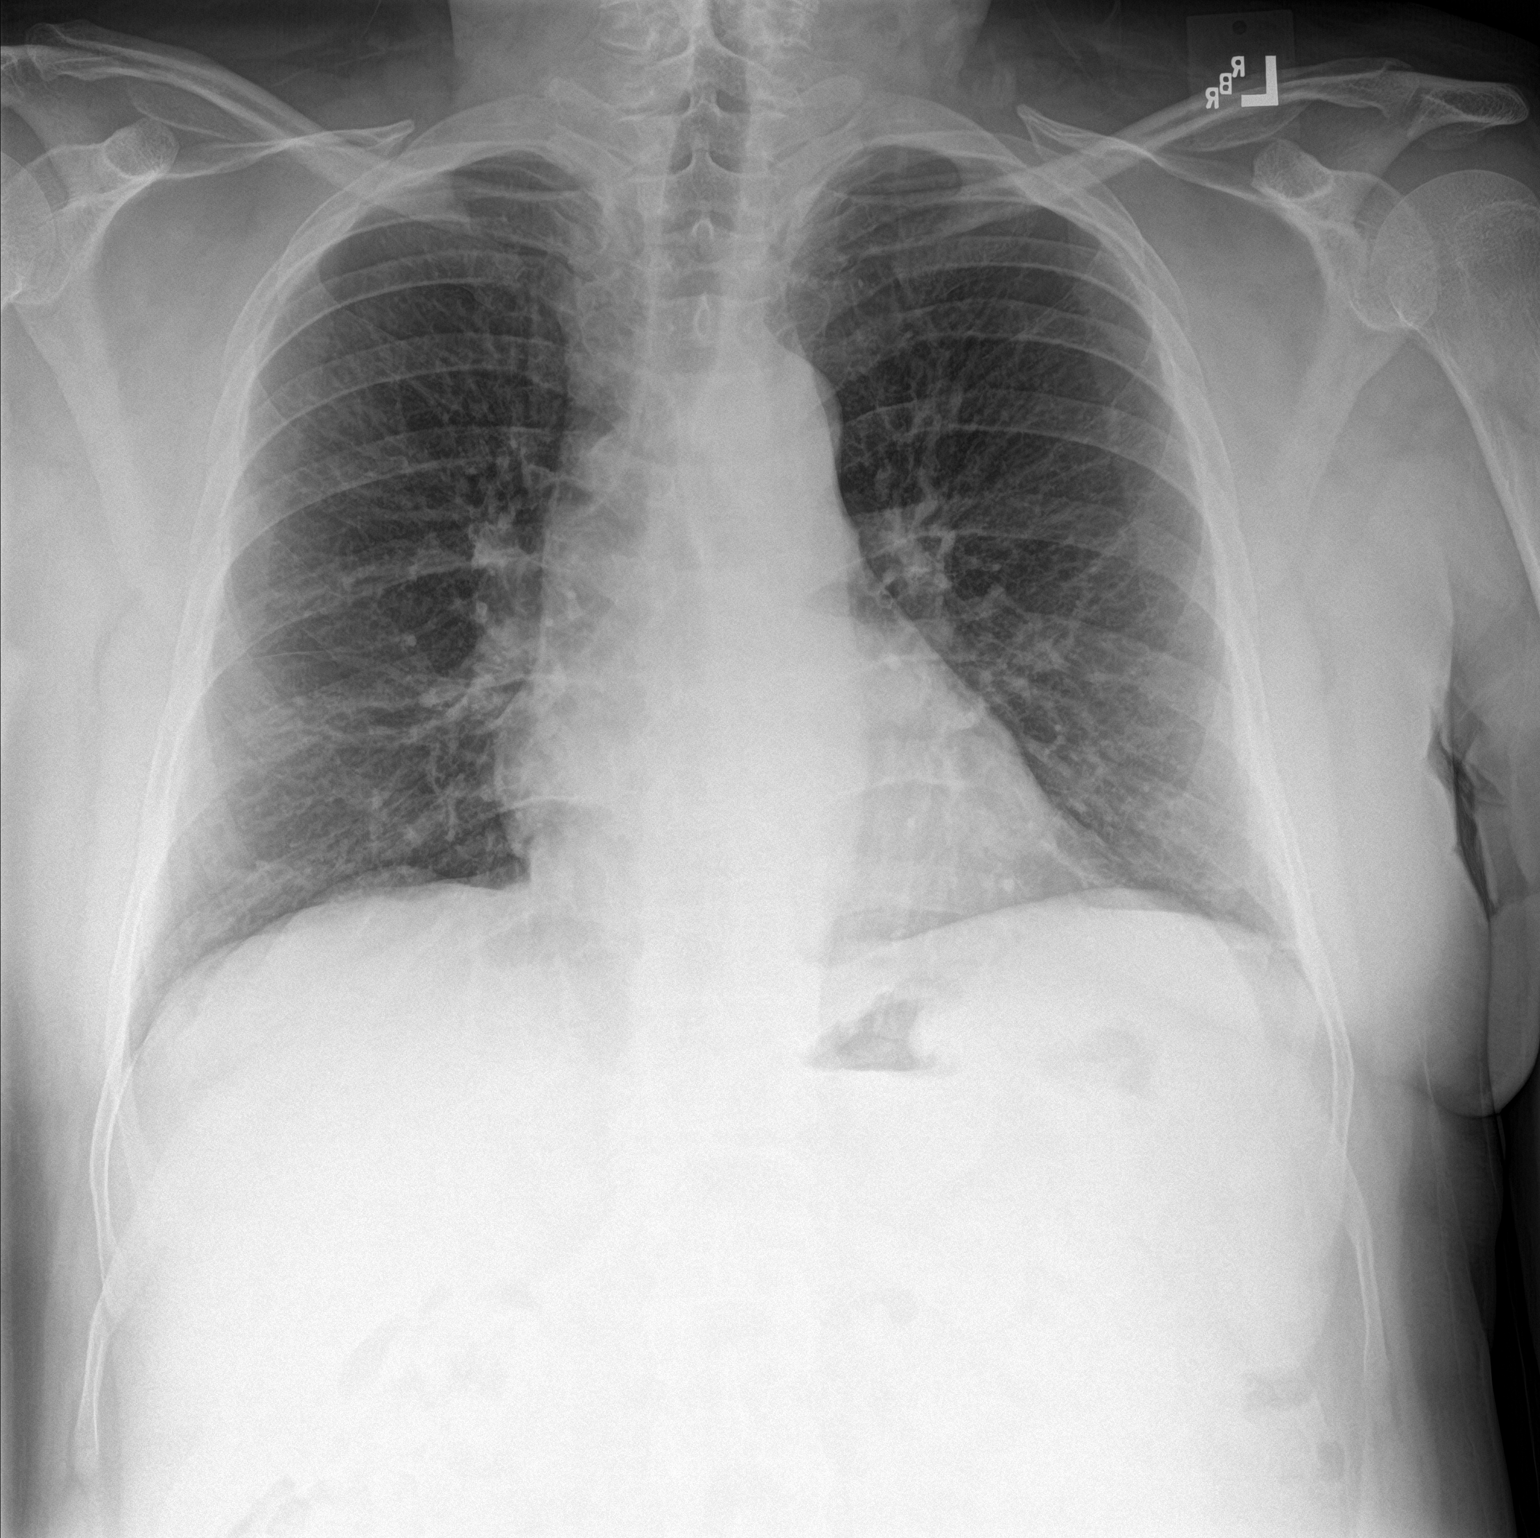

[chest lat]
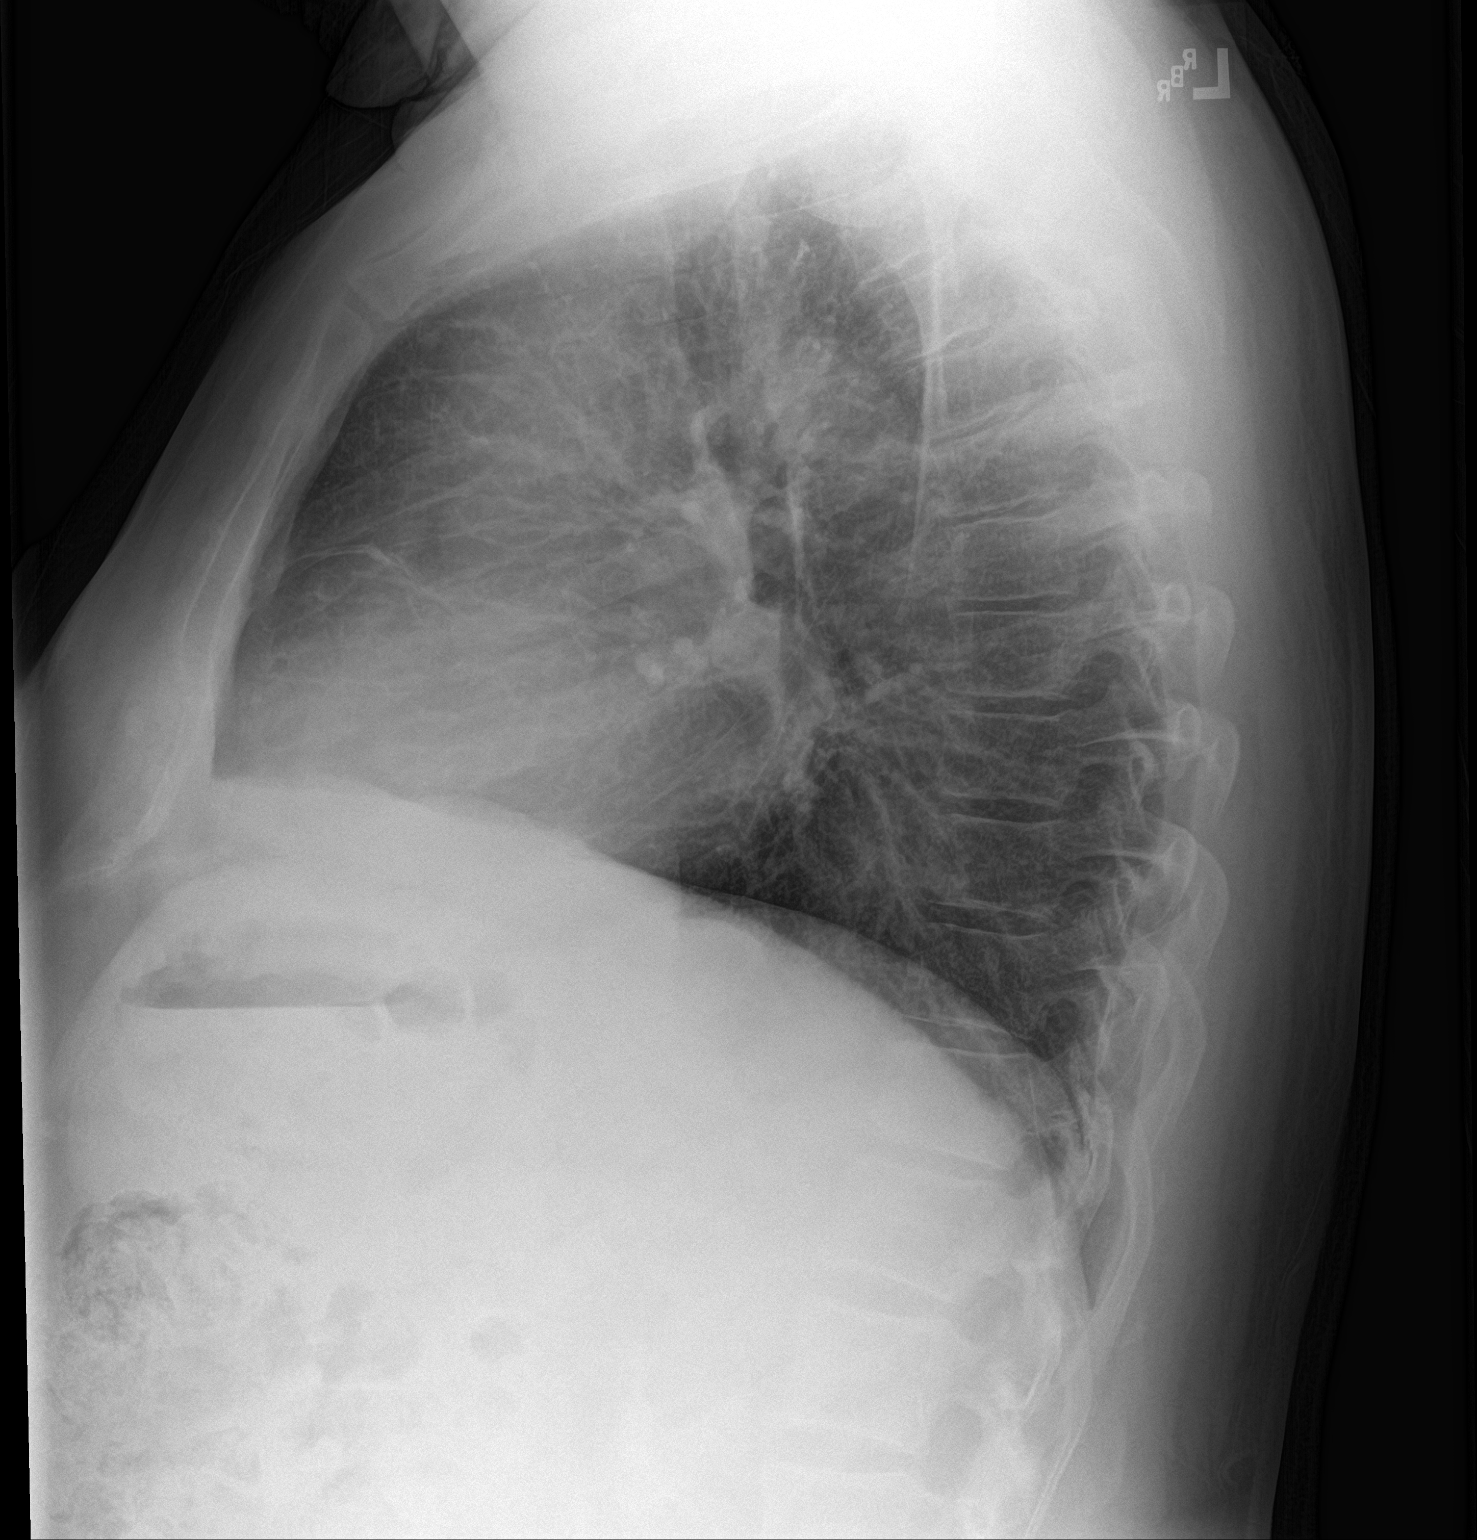

[2 of 2 positions shown; findings below may reference images not displayed]

FINDINGS: Normal cardiomediastinal silhouette. Clear lung fields. No bony
abnormality. Similar appearance to priors.
IMPRESSION: No active cardiopulmonary disease.

## 2017-12-28 ENCOUNTER — Ambulatory Visit (INDEPENDENT_AMBULATORY_CARE_PROVIDER_SITE_OTHER): Payer: Medicaid Other | Admitting: Medical

## 2017-12-28 ENCOUNTER — Ambulatory Visit (HOSPITAL_BASED_OUTPATIENT_CLINIC_OR_DEPARTMENT_OTHER)
Admission: RE | Admit: 2017-12-28 | Discharge: 2017-12-28 | Disposition: A | Discharge status: Home / Self Care | Payer: Medicaid Other | Source: Ambulatory Visit | Attending: Medical | Admitting: Medical

## 2017-12-28 ENCOUNTER — Encounter: Payer: Self-pay | Admitting: Medical

## 2017-12-28 ENCOUNTER — Ambulatory Visit: Payer: Self-pay | Admitting: *Deleted

## 2017-12-28 ENCOUNTER — Other Ambulatory Visit: Payer: Self-pay | Admitting: Medical

## 2017-12-28 VITALS — BP 147/93 | HR 64 | Resp 16 | Ht 68.0 in | Wt 262.0 lb

## 2017-12-28 DIAGNOSIS — R0600 Dyspnea, unspecified: Secondary | ICD-10-CM | POA: Diagnosis not present

## 2017-12-28 DIAGNOSIS — T7840XA Allergy, unspecified, initial encounter: Secondary | ICD-10-CM

## 2017-12-28 DIAGNOSIS — R600 Localized edema: Secondary | ICD-10-CM | POA: Insufficient documentation

## 2017-12-28 DIAGNOSIS — J029 Acute pharyngitis, unspecified: Secondary | ICD-10-CM

## 2017-12-28 DIAGNOSIS — R1310 Dysphagia, unspecified: Secondary | ICD-10-CM | POA: Diagnosis not present

## 2017-12-28 MED ORDER — AZITHROMYCIN 250 MG PO TABS: ORAL_TABLET | ORAL | 0 refills | Status: DC

## 2017-12-28 MED ORDER — LEVOCETIRIZINE DIHYDROCHLORIDE 5 MG PO TABS: 5.0000 mg | ORAL_TABLET | Freq: Every evening | ORAL | 0 refills | Status: DC

## 2017-12-28 MED ORDER — METHYLPREDNISOLONE ACETATE 40 MG/ML IJ SUSP
40.0000 mg | Freq: Once | INTRAMUSCULAR | Status: AC
  Administered 2017-12-28: 40 mg via INTRAMUSCULAR

## 2017-12-28 NOTE — Patient Instructions (Addendum)
Your rapid strep test was negative but your throat does appear moderately red.  We did send out throat culture pending.  I do think it would be best to go ahead and give you a azithromycin antibiotic to start pending throat culture result.  With your reported shortness of breath earlier today and you describe a worse swelling of the throat region when you woke, I do think you would benefit from Depo-Medrol 40 mg injection.  This can decrease swelling and treat allergic reaction.  On review of your previous allergist visit you do have a fair number of allergies.  Worst is to dust mites.  Possible contact to other allergen recently.  We will also prescribe Xyzal antihistamine.  Also I want you to get soft tissue neck x-ray done today.  I want to evaluate your epiglottis region.  If signs or symptoms were to change dramatically after hours and recommend ED evaluation.  Follow-up in 7 days or as needed.  But also recommend follow-up middle of next month early in the morning for follow-up on your lipid panel.  Recommend that she come in fasting.

## 2017-12-28 NOTE — Telephone Encounter (Signed)
Pt states he noted that around  4:30am this morning he felt as if his throat was swollen and noted that he had redness to his throat. Pt states he took Advil this morning for a headache which he usually takes for a headache. Pt denies any sore throat but states he may not be able to tell due to taking Advil earlier this morning.Pt states he feels fatigued and originally had trouble with swallowing and states that the swelling was affecting his breathing. Pt states that this is holy month and has been fasting from sun up to sun set and has not had any new medications or foods. Pt able to speak to triage nurse without difficulty and denies any SOB, dizziness or other symptoms at this time. Pt denies having a fever. Appt scheduled today with Edward,PA and pt also advised that if symptoms worsened and he began to experience difficulty breathing to go to the ED. Pt verbalized understanding.   Answer Assessment - Initial Assessment Questions 1. ONSET: "When did the throat start hurting?" (Hours or days ago)      No complaints of sore throat at this time, but noticed the redness to throat around 4:30am 2. SEVERITY: "How bad is the sore throat?" (Scale 1-10; mild, moderate or severe)   - MILD (1-3):  doesn't interfere with eating or normal activities   - MODERATE (4-7): interferes with eating some solids and normal activities   - SEVERE (8-10):  excruciating pain, interferes with most normal activities   - SEVERE DYSPHAGIA: can't swallow liquids, drooling     Pt states he really can't tell if his throat is sore at this time due to taking advil this morning 3. STREP EXPOSURE: "Has there been any exposure to strep within the past week?" If so, ask: "What type of contact occurred?"      No denies being around any one that is sick 4.  VIRAL SYMPTOMS: "Are there any symptoms of a cold, such as a runny nose, cough, hoarse voice or red eyes?"      Pt denies have other symptoms at this time 5. FEVER: "Do you have a  fever?" If so, ask: "What is your temperature, how was it measured, and when did it start?"     Pt denies having a fever 6. PUS ON THE TONSILS: "Is there pus on the tonsils in the back of your throat?"      No complaints of pus on the tonsils at this time, just redness 7. OTHER SYMPTOMS: "Do you have any other symptoms?" (e.g., difficulty breathing, headache, rash)     headache  Protocols used: SORE THROAT-A-AH

## 2017-12-28 NOTE — Progress Notes (Signed)
Subjective:    Patient ID: Richard Walker, male    DOB: 1955/10/09, 62 y.o.   MRN: [id number]  HPI  Pt in with some recent sore throat x 1 day. Started today. Hurts to swallow. Even his own saliva. Pt had no fever, no chills or sweats. He took advil this morning.  He states throat had red appearance this morning.  Earlier in the day he felt some difficulty swallowing. Since this morning he feels a lot better. He ates some fruit from cactus. He speculates allergy but then states he ate a lot in his life. Describes almost throat closed up but not feeling like this now. His 02 sat is very good today. No wheezing reported.  He had to cough up some mucus this morning.  Also he has allergies. Worse is to dust mites.But no obvious allergic signs or symptoms presently.      Review of Systems  Constitutional: Negative for chills, fatigue and fever.  HENT: Positive for sore throat and trouble swallowing. Negative for congestion, drooling, ear pain, hearing loss, sinus pressure and sinus pain.        See hpi. Brief sensation difficult swallowing this morning only.  Respiratory: Negative for cough, chest tightness, shortness of breath and wheezing.        Mild sob sensation early today but none now.  Cardiovascular: Negative for chest pain and palpitations.  Gastrointestinal: Negative for abdominal distention and abdominal pain.  Musculoskeletal: Negative for back pain, myalgias, neck pain and neck stiffness.  Skin: Negative for rash.  Hematological: Negative for adenopathy.  Psychiatric/Behavioral: Negative for behavioral problems and confusion. The patient is not nervous/anxious.    Past Medical History:  Diagnosis Date  . DDD (degenerative disc disease), lumbar    L3/L4  . Diverticulosis   . GERD (gastroesophageal reflux disease)   . Hyperlipidemia   . Low sperm motility   . Sleep apnea    uses cpap   . SVT (supraventricular tachycardia) (HCC)      Social History   Socioeconomic  History  . Marital status: Married    Spouse name: Not on file  . Number of children: Not on file  . Years of education: Not on file  . Highest education level: Not on file  Occupational History  . Not on file  Social Needs  . Financial resource strain: Not on file  . Food insecurity:    Worry: Not on file    Inability: Not on file  . Transportation needs:    Medical: Not on file    Non-medical: Not on file  Tobacco Use  . Smoking status: Former Smoker    Packs/day: 1.00    Years: 10.00    Pack years: 10.00    Types: Cigarettes    Last attempt to quit: 1992    Years since quitting: 27.3  . Smokeless tobacco: Never Used  . Tobacco comment: Uses Hookah Pipe occasionally  Substance and Sexual Activity  . Alcohol use: No  . Drug use: No  . Sexual activity: Yes  Lifestyle  . Physical activity:    Days per week: Not on file    Minutes per session: Not on file  . Stress: Not on file  Relationships  . Social connections:    Talks on phone: Not on file    Gets together: Not on file    Attends religious service: Not on file    Active member of club or organization: Not on file  Attends meetings of clubs or organizations: Not on file    Relationship status: Not on file  . Intimate partner violence:    Fear of current or ex partner: Not on file    Emotionally abused: Not on file    Physically abused: Not on file    Forced sexual activity: Not on file  Other Topics Concern  . Not on file  Social History Narrative   Marital Status: Married Engineer, drilling)   Children:  4 (3 Sons/1 Daughter)   Pets:  None    Living Situation: Lives with spouse and 4 children   Origin:  He was born in [place].   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     Alcohol Use:  None   Drug Use:  None   Diet:  Regular   Exercise:  None   Hobbies:  Cards    Past  Surgical History:  Procedure Laterality Date  . BACK SURGERY    . CARDIAC CATHETERIZATION N/A 12/22/2015   Procedure: Left Heart Cath and Coronary Angiography;  Surgeon: Peter M Swaziland, MD;  Location: [facility] INVASIVE CV LAB;  Service: Cardiovascular;  Laterality: N/A;  . CARDIAC ELECTROPHYSIOLOGY STUDY AND ABLATION    . ELECTROPHYSIOLOGIC STUDY N/A 02/19/2016   Procedure: SVT Ablation;  Surgeon: Marinus Maw, MD;  Location: [facility] INVASIVE CV LAB;  Service: Cardiovascular;  Laterality: N/A;  . septal reconstruction  2017  . TURBINATE REDUCTION Bilateral 07/2016  . WISDOM TOOTH EXTRACTION      Family History  Problem Relation Age of Onset  . Hypertension Father   . Food Allergy Son   . Colon cancer Neg Hx   . Stomach cancer Neg Hx   . Allergic rhinitis Neg Hx   . Angioedema Neg Hx   . Asthma Neg Hx   . Eczema Neg Hx   . Immunodeficiency Neg Hx   . Urticaria Neg Hx     Allergies  Allergen Reactions  . Tree Extract   . Pork-Derived Products Swelling  . Statins     Muscle aches    Current Outpatient Medications on File Prior to Visit  Medication Sig Dispense Refill  . Albuterol Sulfate (PROAIR HFA IN) Inhale into the lungs.    [name] aspirin 81 MG tablet Take 81 mg by mouth daily.    [name] azelastine (ASTELIN) 0.1 % nasal spray 1-2 sprays per nostril 2 times daily as needed. 30 mL 5  . ezetimibe (ZETIA) 10 MG tablet Take 1 tablet (10 mg total) by mouth daily. 30 tablet 3  . flecainide (TAMBOCOR) 50 MG tablet Take as needed for palpitations despite taking Metoprolol (do not exceed 300 mg in 24 hours) 45 tablet 3  . metoprolol tartrate (LOPRESSOR) 25 MG tablet Take 1 tablet (25 mg total) by mouth 2 (two) times daily. 180 tablet 3  . montelukast (SINGULAIR) 10 MG tablet Take 1 tablet once at night for coughing or wheezing. 30 tablet 5  . pantoprazole (PROTONIX) 40 MG tablet TAKE 1 TABLET(40 MG) BY MOUTH TWICE DAILY BEFORE A MEAL 60 tablet 0  . PRESCRIPTION MEDICATION Inhale into the lungs at  bedtime. CPAP    . sildenafil (REVATIO) 20 MG tablet Take 1-2 tablets (20-40 mg total) by mouth as needed. 60 tablet 1   No current facility-administered medications on file prior to visit.     BP (!) 147/93 (BP Location: Right Arm, Patient  Position: Sitting, Cuff Size: Large)   Pulse 64   Resp 16   Ht  (1.727 m)   Wt 262 lb (118.8 kg)   SpO2 99%   BMI 39.84 kg/m       Objective:   Physical Exam   General  Mental Status - Alert. General Appearance - Well groomed. Not in acute distress.  Skin Rashes- No Rashes.  HEENT Head- Normal. Ear Auditory Canal - Left- Normal. Right - Normal.Tympanic Membrane- Left- Normal. Right- Normal. Eye Sclera/Conjunctiva- Left- Normal. Right- Normal. Nose & Sinuses Nasal Mucosa- Left-  Boggy and Congested. Right-  Boggy and  Congested.Bilateral maxillary and frontal sinus pressure. Mouth & Throat Lips: Upper Lip- Normal: no dryness, cracking, pallor, cyanosis, or vesicular eruption. Lower Lip-Normal: no dryness, cracking, pallor, cyanosis or vesicular eruption. Buccal Mucosa- Bilateral- No Aphthous ulcers. Oropharynx- No Discharge or Erythema. Tonsils: Characteristics- Bilateral- Mild  Erythema Size/Enlargement- Bilateral- 1+ enlargement. Discharge- bilateral-None.  Neck Neck- Supple. No Masses. Mild enlarged submandibular nodes.   Chest and Lung Exam Auscultation: Breath Sounds:-Clear even and unlabored.  Cardiovascular Auscultation:Rythm- Regular, rate and rhythm. Murmurs & Other Heart Sounds:Ausculatation of the heart reveal- No Murmurs.  Lymphatic Head & Neck General Head & Neck Lymphatics: Bilateral: Description- see neck exam.     Assessment & Plan:  Your rapid strep test was negative but your throat does appear moderately red.  We didsend out throat culture pending.  I do think it would be best to go ahead and give you a azithromycin antibiotic to start pending throat culture result.  With your reported shortness of  breath earlier today and you describe a worse swelling of the throat region when you woke, I do think you would benefit from Depo-Medrol 40 mg injection.  This can decrease swelling and treat allergic reaction.  On review of your previous allergist visit you do have a fair number of allergies.  Worst is to dust mites.  Possible contact to other allergen recently.  We will also prescribe Xyzal antihistamine.  Also I want you to get soft tissue neck x-ray done today.  I want to evaluate your epiglottis region.  If signs or symptoms were to change dramatically after hours and recommend ED evaluation.  Follow-up in 7 days or as needed.  But also recommend follow-up middle of next month early in the morning for follow-up on your lipid panel.  Recommend that she come in fasting.

## 2017-12-30 LAB — CULTURE, GROUP A STREP
MICRO NUMBER:: 90561753
SPECIMEN QUALITY: ADEQUATE

## 2018-01-05 ENCOUNTER — Ambulatory Visit: Payer: Medicaid Other | Admitting: Medical

## 2018-01-12 ENCOUNTER — Other Ambulatory Visit: Payer: Self-pay | Admitting: Internal Medicine

## 2018-02-14 ENCOUNTER — Other Ambulatory Visit: Payer: Self-pay | Admitting: Gastroenterology

## 2018-02-20 ENCOUNTER — Telehealth: Payer: Self-pay | Admitting: Medical

## 2018-02-20 ENCOUNTER — Ambulatory Visit (INDEPENDENT_AMBULATORY_CARE_PROVIDER_SITE_OTHER): Payer: Medicaid Other | Admitting: Family

## 2018-02-20 ENCOUNTER — Encounter: Payer: Self-pay | Admitting: Family

## 2018-02-20 VITALS — BP 146/82 | HR 66 | Temp 98.3°F | Ht 68.0 in | Wt 255.0 lb

## 2018-02-20 DIAGNOSIS — K0889 Other specified disorders of teeth and supporting structures: Secondary | ICD-10-CM

## 2018-02-20 MED ORDER — HYDROCODONE-ACETAMINOPHEN 5-325 MG PO TABS: 1.0000 | ORAL_TABLET | Freq: Four times a day (QID) | ORAL | 0 refills | Status: DC | PRN

## 2018-02-20 MED ORDER — AMOXICILLIN 500 MG PO CAPS: 500.0000 mg | ORAL_CAPSULE | Freq: Three times a day (TID) | ORAL | 0 refills | Status: DC

## 2018-02-20 NOTE — Progress Notes (Signed)
Richard Walker is a 62 y.o. male with the following history as recorded in EpicCare:  Patient Active Problem List   Diagnosis Date Noted  . Perennial and seasonal allergic rhinitis 11/09/2017  . History of food allergy 11/09/2017  . Wheezing 11/09/2017  . Dyspnea/wheezing 10/31/2017  . Cough 10/31/2017  . Bronchitis, mucopurulent recurrent (HCC) 09/07/2017  . SVT (supraventricular tachycardia) (HCC) 04/23/2015  . Abdominal pain, epigastric 06/03/2013  . Unspecified vitamin D deficiency 04/09/2013  . Esophageal reflux 04/09/2013  . Hepatitis B carrier (HCC) 04/09/2013  . Other and unspecified hyperlipidemia 04/09/2013  . Unspecified sleep apnea 04/09/2013    Current Outpatient Medications  Medication Sig Dispense Refill  . Albuterol Sulfate (PROAIR HFA IN) Inhale into the lungs.    [name]. aspirin 81 MG tablet Take 81 mg by mouth daily.    [name]. azelastine (ASTELIN) 0.1 % nasal spray 1-2 sprays per nostril 2 times daily as needed. 30 mL 5  . ezetimibe (ZETIA) 10 MG tablet Take 1 tablet (10 mg total) by mouth daily. 30 tablet 3  . flecainide (TAMBOCOR) 50 MG tablet TAKE 1 TABLET BY MOUTH AS NEEDED FOR PALPITATIONS DESPITE TAKING METOPROLOL. NOT TO EXCEED 6 TABLETS IN 24 HOURS 45 tablet 3  . levocetirizine (XYZAL) 5 MG tablet TAKE 1 TABLET(5 MG) BY MOUTH EVERY EVENING 90 tablet 0  . metoprolol tartrate (LOPRESSOR) 25 MG tablet Take 1 tablet (25 mg total) by mouth 2 (two) times daily. 180 tablet 3  . montelukast (SINGULAIR) 10 MG tablet Take 1 tablet once at night for coughing or wheezing. 30 tablet 5  . pantoprazole (PROTONIX) 40 MG tablet TAKE 1 TABLET(40 MG) BY MOUTH TWICE DAILY BEFORE A MEAL 60 tablet 0  . PRESCRIPTION MEDICATION Inhale into the lungs at bedtime. CPAP    . PROAIR HFA 108 (90 Base) MCG/ACT inhaler INL 2 PFS ITL Q 6 H PRF WHZ OR SOB  0  . sildenafil (REVATIO) 20 MG tablet Take 1-2 tablets (20-40 mg total) by mouth as needed. 60 tablet 1  . amoxicillin (AMOXIL) 500 MG capsule Take  1 capsule (500 mg total) by mouth 3 (three) times daily. 30 capsule 0   No current facility-administered medications for this visit.     Allergies: Tree extract; Pork-derived products; and Statins  Past Medical History:  Diagnosis Date  . DDD (degenerative disc disease), lumbar    L3/L4  . Diverticulosis   . GERD (gastroesophageal reflux disease)   . Hyperlipidemia   . Low sperm motility   . Sleep apnea    uses cpap   . SVT (supraventricular tachycardia) (HCC)     Past Surgical History:  Procedure Laterality Date  . BACK SURGERY    . CARDIAC CATHETERIZATION N/A 12/22/2015   Procedure: Left Heart Cath and Coronary Angiography;  Surgeon: Peter M SwazilandJordan, MD;  [facility]AS[facility] CV LAB;  Service: Cardiovascular;  Laterality: N/A;  . CARDIAC ELECTROPHYSIOLOGY STUDY AND ABLATION    . ELECTROPHYSIOLOGIC STUDY N/A 02/19/2016   Procedure: SVT Ablation;  Surgeon: Marinus MawGregg W Taylor, MD;  [facility];  Service: Cardiovascular;  Laterality: N/A;  . septal reconstruction  2017  . TURBINATE REDUCTION Bilateral 07/2016  . WISDOM TOOTH EXTRACTION      Family History  Problem Relation Age of Onset  . Hypertension Father   . Food Allergy Son   . Colon cancer Neg Hx   . Stomach cancer Neg Hx   . Allergic rhinitis Neg Hx   . Angioedema  Neg Hx   . Asthma Neg Hx   . Eczema Neg Hx   . Immunodeficiency Neg Hx   . Urticaria Neg Hx     Social History   Tobacco Use  . Smoking status: Former Smoker    Packs/day: 1.00    Years: 10.00    Pack years: 10.00    Types: Cigarettes    Last attempt to quit: 1992    Years since quitting: 27.5  . Smokeless tobacco: Never Used  . Tobacco comment: Uses Hookah Pipe occasionally  Substance Use Topics  . Alcohol use: No    Subjective:  Patient presents with concerns for 3 day history of left sided mouth/ dental pain; has known problems with the particular tooth; feels that the tooth is extremely sensitive/ nerve is possibly exposed; does  not currently have a dentist/ in the process of looking for dentist that accepts Medicaid; asks if it would be possible to get pain medication today;    Objective:  Vitals:   02/20/18 1159  BP: (!) 146/82  Pulse: 66  Temp: 98.3 F (36.8 C)  TempSrc: Oral  SpO2: 96%  Weight: 255 lb (115.7 kg)  Height: 5\' 8"  (1.727 m)    General: Well developed, well nourished, in no acute distress  Skin : Warm and dry.  Head: Normocephalic and atraumatic  Oropharynx: Pink, supple. No suspicious lesions; tooth c/w dental cavity/ small opening noted on upper left side of mouth Neck: Supple without thyromegaly, adenopathy  Lungs: Respirations unlabored; Neurologic: Alert and oriented; speech intact; face symmetrical; moves all extremities well; CNII-XII intact without focal deficit   Assessment:  1. Pain, dental     Plan:  Rx for Amoxicillin 500 mg tid x 10 days; contact information given for local dental resources; follow-up with his PCP as needed otherwise.   No follow-ups on file.  No orders of the defined types were placed in this encounter.   Requested Prescriptions   Signed Prescriptions Disp Refills  . amoxicillin (AMOXIL) 500 MG capsule 30 capsule 0    Sig: Take 1 capsule (500 mg total) by mouth 3 (three) times daily.

## 2018-02-20 NOTE — Telephone Encounter (Signed)
I reviewed copy of note  from the provider seeing patient today.  Patient appears to have tooth pain/possible infection.  He was given amoxicillin but he has severe pain despite using Tylenol and Advil.  I did send him in 6 tablets of Norco to help him with the pain as the antibiotics take effect.  I explained to him I could only give him limited prescription and if he does not improve then would need to see him later this week.  Encouraged him to go ahead and call dentist and schedule appointment with them.  He is awaiting callback from one office that he already called earlier today.  Offered him appointment later this week if he needs to be seen with me.

## 2018-02-22 ENCOUNTER — Ambulatory Visit: Payer: Medicaid Other | Admitting: Allergy and Immunology

## 2018-03-28 ENCOUNTER — Ambulatory Visit: Payer: Self-pay

## 2018-03-28 ENCOUNTER — Ambulatory Visit (INDEPENDENT_AMBULATORY_CARE_PROVIDER_SITE_OTHER): Payer: Medicaid Other | Admitting: Family

## 2018-03-28 ENCOUNTER — Other Ambulatory Visit (INDEPENDENT_AMBULATORY_CARE_PROVIDER_SITE_OTHER): Payer: Medicaid Other

## 2018-03-28 ENCOUNTER — Ambulatory Visit (INDEPENDENT_AMBULATORY_CARE_PROVIDER_SITE_OTHER)
Admission: RE | Admit: 2018-03-28 | Discharge: 2018-03-28 | Disposition: A | Discharge status: Home / Self Care | Payer: Medicaid Other | Source: Ambulatory Visit | Attending: Family | Admitting: Family

## 2018-03-28 ENCOUNTER — Encounter: Payer: Self-pay | Admitting: Family

## 2018-03-28 VITALS — BP 144/82 | HR 71 | Temp 98.2°F | Ht 68.0 in | Wt 262.0 lb

## 2018-03-28 DIAGNOSIS — R0602 Shortness of breath: Secondary | ICD-10-CM

## 2018-03-28 DIAGNOSIS — R0609 Other forms of dyspnea: Secondary | ICD-10-CM | POA: Diagnosis not present

## 2018-03-28 DIAGNOSIS — R062 Wheezing: Secondary | ICD-10-CM | POA: Diagnosis not present

## 2018-03-28 DIAGNOSIS — J3089 Other allergic rhinitis: Secondary | ICD-10-CM | POA: Diagnosis not present

## 2018-03-28 LAB — CBC WITH DIFFERENTIAL/PLATELET
BASOS ABS: 0.1 10*3/uL (ref 0.0–0.1)
BASOS PCT: 0.8 % (ref 0.0–3.0)
EOS ABS: 0.1 10*3/uL (ref 0.0–0.7)
Eosinophils Relative: 1.6 % (ref 0.0–5.0)
HCT: 46.9 % (ref 39.0–52.0)
HEMOGLOBIN: 16 g/dL (ref 13.0–17.0)
Lymphocytes Relative: 31.6 % (ref 12.0–46.0)
Lymphs Abs: 2.3 10*3/uL (ref 0.7–4.0)
MCHC: 34.2 g/dL (ref 30.0–36.0)
MCV: 84.9 fl (ref 78.0–100.0)
MONO ABS: 0.5 10*3/uL (ref 0.1–1.0)
MONOS PCT: 7.4 % (ref 3.0–12.0)
NEUTROS ABS: 4.2 10*3/uL (ref 1.4–7.7)
Neutrophils Relative %: 58.6 % (ref 43.0–77.0)
Platelets: 182 10*3/uL (ref 150.0–400.0)
RBC: 5.53 Mil/uL (ref 4.22–5.81)
RDW: 13.7 % (ref 11.5–15.5)
WBC: 7.2 10*3/uL (ref 4.0–10.5)

## 2018-03-28 LAB — COMPREHENSIVE METABOLIC PANEL
ALT: 22 U/L (ref 0–53)
AST: 15 U/L (ref 0–37)
Albumin: 4.3 g/dL (ref 3.5–5.2)
Alkaline Phosphatase: 77 U/L (ref 39–117)
BILIRUBIN TOTAL: 0.7 mg/dL (ref 0.2–1.2)
BUN: 11 mg/dL (ref 6–23)
CALCIUM: 9.7 mg/dL (ref 8.4–10.5)
CHLORIDE: 102 meq/L (ref 96–112)
CO2: 27 meq/L (ref 19–32)
Creatinine, Ser: 1 mg/dL (ref 0.40–1.50)
GFR: 80.53 mL/min (ref >60.00)
Glucose, Bld: 106 mg/dL — ABNORMAL HIGH (ref 70–99)
POTASSIUM: 4 meq/L (ref 3.5–5.1)
Sodium: 136 mEq/L (ref 135–145)
Total Protein: 7.5 g/dL (ref 6.0–8.3)

## 2018-03-28 LAB — BRAIN NATRIURETIC PEPTIDE: Pro B Natriuretic peptide (BNP): 24 pg/mL (ref 0.0–100.0)

## 2018-03-28 MED ORDER — MONTELUKAST SODIUM 10 MG PO TABS: ORAL_TABLET | ORAL | 1 refills | Status: DC

## 2018-03-28 MED ORDER — ALBUTEROL SULFATE HFA 108 (90 BASE) MCG/ACT IN AERS: 1.0000 | INHALATION_SPRAY | Freq: Four times a day (QID) | RESPIRATORY_TRACT | 0 refills | Status: DC | PRN

## 2018-03-28 NOTE — Telephone Encounter (Signed)
Pt. called to report increased shortness of breath over past 2 days.  Stated his breathing becomes harder with minimal activity, or I in walking a short distance.  Denied chest tightness or wheezing.  Denied fever/ chills, or swelling of LE's.  Reported has an infrequent dry cough.    Reported he ran out of his inhaler.  Advised he needs an appt. Today; no available openings at PCP office.  [facility]  Agrees with plan.  Care advice given per protocol.    Reason for Disposition . [1] MILD difficulty breathing (e.g., minimal/no SOB at rest, SOB with walking, pulse <100) AND [2] NEW-onset or WORSE than normal    Stating has shortness of breath with minimal activity  Answer Assessment - Initial Assessment Questions 1. RESPIRATORY STATUS: "Describe your breathing?" (e.g., wheezing, shortness of breath, unable to speak, severe coughing)      Short of breath with minimal activity, short distance walking; has to breath harder, as if he was running  2. ONSET: "When did this breathing problem begin?"      2 days ago 3. PATTERN "Does the difficult breathing come and go, or has it been constant since it started?"      Intermittent and worsens with minimal activity 4. SEVERITY: "How bad is your breathing?" (e.g., mild, moderate, severe)    - MILD: No SOB at rest, mild SOB with walking, speaks normally in sentences, can lay down, no retractions, pulse < 100.    - MODERATE: SOB at rest, SOB with minimal exertion and prefers to sit, cannot lie down flat, speaks in phrases, mild retractions, audible wheezing, pulse 100-120.    - SEVERE: Very SOB at rest, speaks in single words, struggling to breathe, sitting hunched forward, retractions, pulse > 120      Mild to moderate  5. RECURRENT SYMPTOM: "Have you had difficulty breathing before?" If so, ask: "When was the last time?" and "What happened that time?"      Similar to previous shortness of breath  6. CARDIAC HISTORY: "Do  you have any history of heart disease?" (e.g., heart attack, angina, bypass surgery, angioplasty)      Hx of SVT and Ablation   7. LUNG HISTORY: "Do you have any history of lung disease?"  (e.g., pulmonary embolus, asthma, emphysema)     Denied asthma or emphysema or PE;  Hx of Sleep apnea; uses CPAP 8. CAUSE: "What do you think is causing the breathing problem?"      Unknown  9. OTHER SYMPTOMS: "Do you have any other symptoms? (e.g., dizziness, runny nose, cough, chest pain, fever)     Denied fever/ chills, swelling of feet/ legs; has an infreq. dry cough  10. TRAVEL: "Have you traveled out of the country in the last month?" (e.g., travel history, exposures)       denied  Protocols used: BREATHING DIFFICULTY-A-AH

## 2018-03-28 NOTE — Progress Notes (Signed)
Richard Walker is a 62 y.o. male with the following history as recorded in EpicCare:  Patient Active Problem List   Diagnosis Date Noted  . Perennial and seasonal allergic rhinitis 11/09/2017  . History of food allergy 11/09/2017  . Wheezing 11/09/2017  . Dyspnea/wheezing 10/31/2017  . Cough 10/31/2017  . Bronchitis, mucopurulent recurrent ([place]) 09/07/2017  . SVT (supraventricular tachycardia) ([place]) 04/23/2015  . Abdominal pain, epigastric 06/03/2013  . Unspecified vitamin D deficiency 04/09/2013  . Esophageal reflux 04/09/2013  . Hepatitis B carrier ([place]) 04/09/2013  . Other and unspecified hyperlipidemia 04/09/2013  . Unspecified sleep apnea 04/09/2013    Current Outpatient Medications  Medication Sig Dispense Refill  . albuterol (PROAIR HFA) 108 (90 Base) MCG/ACT inhaler Inhale 1-2 puffs into the lungs every 6 (six) hours as needed for wheezing or shortness of breath. 1 Inhaler 0  . aspirin 81 MG tablet Take 81 mg by mouth daily.    [name] azelastine (ASTELIN) 0.1 % nasal spray 1-2 sprays per nostril 2 times daily as needed. 30 mL 5  . ezetimibe (ZETIA) 10 MG tablet Take 1 tablet (10 mg total) by mouth daily. 30 tablet 3  . flecainide (TAMBOCOR) 50 MG tablet TAKE 1 TABLET BY MOUTH AS NEEDED FOR PALPITATIONS DESPITE TAKING METOPROLOL. NOT TO EXCEED 6 TABLETS IN 24 HOURS 45 tablet 3  . HYDROcodone-acetaminophen (NORCO) 5-325 MG tablet Take 1 tablet by mouth every 6 (six) hours as needed for moderate pain. 6 tablet 0  . levocetirizine (XYZAL) 5 MG tablet TAKE 1 TABLET(5 MG) BY MOUTH EVERY EVENING 90 tablet 0  . metoprolol tartrate (LOPRESSOR) 25 MG tablet Take 1 tablet (25 mg total) by mouth 2 (two) times daily. 180 tablet 3  . montelukast (SINGULAIR) 10 MG tablet Take 1 tablet once at night for coughing or wheezing. 30 tablet 1  . pantoprazole (PROTONIX) 40 MG tablet TAKE 1 TABLET(40 MG) BY MOUTH TWICE DAILY BEFORE A MEAL 60 tablet 0  . PRESCRIPTION MEDICATION Inhale into the lungs at  bedtime. CPAP    . sildenafil (REVATIO) 20 MG tablet Take 1-2 tablets (20-40 mg total) by mouth as needed. 60 tablet 1   No current facility-administered medications for this visit.     Allergies: Tree extract; Pork-derived products; and Statins  Past Medical History:  Diagnosis Date  . DDD (degenerative disc disease), lumbar    L3/L4  . Diverticulosis   . GERD (gastroesophageal reflux disease)   . Hyperlipidemia   . Low sperm motility   . Sleep apnea    uses cpap   . SVT (supraventricular tachycardia) (HCC)     Past Surgical History:  Procedure Laterality Date  . BACK SURGERY    . CARDIAC CATHETERIZATION N/A 12/22/2015   Procedure: Left Heart Cath and Coronary Angiography;  Surgeon: Peter M Martinique, MD;  Location: [facility];  Service: Cardiovascular;  Laterality: N/A;  . CARDIAC ELECTROPHYSIOLOGY STUDY AND ABLATION    . ELECTROPHYSIOLOGIC STUDY N/A 02/19/2016   Procedure: SVT Ablation;  Surgeon: Evans Lance, MD;  Location: [facility];  Service: Cardiovascular;  Laterality: N/A;  . septal reconstruction  2017  . TURBINATE REDUCTION Bilateral 07/2016  . WISDOM TOOTH EXTRACTION      Family History  Problem Relation Age of Onset  . Hypertension Father   . Food Allergy Son   . Colon cancer Neg Hx   . Stomach cancer Neg Hx   . Allergic rhinitis Neg Hx   . Angioedema Neg Hx   .  Asthma Neg Hx   . Eczema Neg Hx   . Immunodeficiency Neg Hx   . Urticaria Neg Hx     Social History   Tobacco Use  . Smoking status: Former Smoker    Packs/day: 1.00    Years: 10.00    Pack years: 10.00    Types: Cigarettes    Last attempt to quit: 1992    Years since quitting: 27.6  . Smokeless tobacco: Never Used  . Tobacco comment: Uses Hookah Pipe occasionally  Substance Use Topics  . Alcohol use: No    Subjective: Patient presents with concerns for worsening sense of shortness of breath x 2-3 days; not a new problem for this patient; under care of pulmonology for  symptoms- last saw them in March 2019 and due for follow-up in the next month; No chest pain on exertion; has been out of albuterol for at least 1-2 weeks; +increased wheezing in the past few days; does have history of reflux- takes Protonix as needed; Does have CPAP- currently in the process of having humidity level adjusted. History of allergies- has seen allergist in the past but did not understand he was supposed to stay on Singulair regularly.      Objective:  Vitals:   03/28/18 1451  BP: (!) 144/82  Pulse: 71  Temp: 98.2 F (36.8 C)  TempSrc: Oral  SpO2: 96%  Weight: 262 lb (118.8 kg)  Height: _0  (1.727 m)    General: Well developed, well nourished, in no acute distress  Skin : Warm and dry.  Head: Normocephalic and atraumatic  Eyes: Sclera and conjunctiva clear; pupils round and reactive to light; extraocular movements intact  Ears: External normal; canals clear; tympanic membranes normal  Oropharynx: Pink, supple. No suspicious lesions  Neck: Supple without thyromegaly, adenopathy  Lungs: Respirations unlabored; occasional wheeze noted   CVS exam: normal rate and regular rhythm.  Neurologic: Alert and oriented; speech intact; face symmetrical; moves all extremities well; CNII-XII intact without focal deficit  Assessment:  1. Shortness of breath   2. Other allergic rhinitis   3. Other form of dyspnea   4. Wheezing     Plan:  Update EKG- NSR; update labs and CXR; patient is not in acute distress in office- suspect related to uncontrolled allergies/ asthma; re-start Singulair and refill on albuterol; he is given sample of Symbicort 160- use 2 puffs bid x 2 weeks; follow-up with his pulmonologist and PCP for further needs.    No follow-ups on file.  Orders Placed This Encounter  Procedures  . DG Chest 2 View    Standing Status:   Future    Number of Occurrences:   1    Standing Expiration Date:   05/29/2019    Order Specific Question:   Reason for Exam (SYMPTOM  OR  DIAGNOSIS REQUIRED)    Answer:   shortness of breath    Order Specific Question:   Preferred imaging location?    Answer:   [facility]    Order Specific Question:   Radiology Contrast Protocol - do NOT remove file path    Answer:   [email or web address]  . CBC w/Diff    Standing Status:   Future    Number of Occurrences:   1    Standing Expiration Date:   03/28/2019  . Comp Met (CMET)    Standing Status:   Future    Number of Occurrences:   1    Standing Expiration Date:   03/28/2019  .  D-Dimer, Quantitative    Standing Status:   Future    Number of Occurrences:   1    Standing Expiration Date:   03/28/2019  . B Nat Peptide    Standing Status:   Future    Number of Occurrences:   1    Standing Expiration Date:   03/28/2019  . EKG 12-Lead    Requested Prescriptions   Signed Prescriptions Disp Refills  . albuterol (PROAIR HFA) 108 (90 Base) MCG/ACT inhaler 1 Inhaler 0    Sig: Inhale 1-2 puffs into the lungs every 6 (six) hours as needed for wheezing or shortness of breath.  . montelukast (SINGULAIR) 10 MG tablet 30 tablet 1    Sig: Take 1 tablet once at night for coughing or wheezing.

## 2018-03-29 ENCOUNTER — Telehealth: Payer: Self-pay | Admitting: Medical

## 2018-03-29 DIAGNOSIS — M79673 Pain in unspecified foot: Secondary | ICD-10-CM

## 2018-03-29 LAB — D-DIMER, QUANTITATIVE: D-Dimer, Quant: 0.51 mcg/mL FEU — ABNORMAL HIGH (ref <0.50)

## 2018-03-29 NOTE — Telephone Encounter (Signed)
Patient requesting a referral to [facility] as he has an appt tomorrow 8/8 at 10:15am. Per insurance needs referral from pcp. Please see podiatry notes as the dx is regarding those issues.

## 2018-03-30 ENCOUNTER — Telehealth: Payer: Self-pay | Admitting: Family

## 2018-03-30 DIAGNOSIS — M79671 Pain in right foot: Secondary | ICD-10-CM | POA: Insufficient documentation

## 2018-03-30 NOTE — Telephone Encounter (Signed)
[name], referral placed. Can you please make sure Richard Walker sees and sends over before his apt this am?

## 2018-03-30 NOTE — Addendum Note (Signed)
Addended by: Sandford Craze'SULLIVAN, Jocelyn Nold on: 03/30/2018 07:57 AM   Modules accepted: Orders

## 2018-03-31 NOTE — Telephone Encounter (Signed)
Referral placed at pt request.

## 2018-04-27 ENCOUNTER — Ambulatory Visit (INDEPENDENT_AMBULATORY_CARE_PROVIDER_SITE_OTHER): Payer: Medicaid Other | Admitting: Pulmonary Disease

## 2018-04-27 ENCOUNTER — Encounter: Payer: Self-pay | Admitting: Pulmonary Disease

## 2018-04-27 VITALS — BP 116/74 | HR 61 | Ht 68.0 in | Wt 260.0 lb

## 2018-04-27 DIAGNOSIS — G4733 Obstructive sleep apnea (adult) (pediatric): Secondary | ICD-10-CM

## 2018-04-27 DIAGNOSIS — R0609 Other forms of dyspnea: Secondary | ICD-10-CM

## 2018-04-27 DIAGNOSIS — R059 Cough, unspecified: Secondary | ICD-10-CM

## 2018-04-27 DIAGNOSIS — R05 Cough: Secondary | ICD-10-CM

## 2018-04-27 MED ORDER — BUDESONIDE-FORMOTEROL FUMARATE 160-4.5 MCG/ACT IN AERO: 2.0000 | INHALATION_SPRAY | Freq: Two times a day (BID) | RESPIRATORY_TRACT | 6 refills | Status: DC

## 2018-04-27 MED ORDER — BUDESONIDE-FORMOTEROL FUMARATE 160-4.5 MCG/ACT IN AERO: 2.0000 | INHALATION_SPRAY | Freq: Two times a day (BID) | RESPIRATORY_TRACT | 0 refills | Status: DC

## 2018-04-27 NOTE — Patient Instructions (Signed)
Symbicort two puffs in the morning and two puffs at night Singulair 10 mg pill nightly Albuterol 2 puffs every 6 hours as needed for cough, wheeze, chest congestion, or shortness of breath Will arrange for overnight oxygen test with you using CPAP Will arrange for referral back to cardiology to further assess cause of your shortness of breath  Follow up in 8 weeks

## 2018-04-27 NOTE — Progress Notes (Signed)
[facility]  Chief Complaint  Patient presents with  . Follow-up    7 month follow up for SOB. Per patient, his SOB has not improved since last visit. SOB increases during exertion.     Constitutional: BP 116/74 (BP Location: Left Arm, Patient Position: Sitting, Cuff Size: Normal)   Pulse 61   Ht 5\' 8"  (1.727 m)   Wt 260 lb (117.9 kg)   SpO2 96%   BMI 39.53 kg/m   History of Present Illness: Richard Walker is a 62 y.o. male former smoker with dyspnea and cough.  Since his last visit with me he had PFT and CT chest.  PFT showed mild restriction and diffusion defect.  CT chest from 10/21/17 (reviewed by me) showed atherosclerosis and scarring.  No evidence for ILD.  He was seen by Dr. Nunzio Cobbs with allergy/immunology.  Had allergy testing done.  Found to have reaction to grass pollen, tree pollen, cockroach antigen, and dust mite antigen.  He was tried on singulair and symbicort.  These helped some.  He still gets short of breath with activity and feels like an old man.  He has noticed that his heart rate goes up and down.  He bought a pulse oximeter, and SpO2 stays above 89%.  He has CPAP from Dr. Vickey Huger.  He as been getting some mask leak.  He wasn't able to tolerate increase in temperature for humidifier.  Gets dry mouth at night.  He was seen by podiatry for plantar fasciitis and bunions.  He doesn't want to have surgery.  These limit his exercise ability.  Comprehensive Respiratory Exam:  Appearance - well kempt  ENMT - nasal mucosa moist, turbinates clear, midline nasal septum, no dental lesions, no gingival bleeding, no oral exudates, no tonsillar hypertrophy, MP 3 Neck - no masses, trachea midline, no thyromegaly, no elevation in JVP Respiratory - normal appearance of chest wall, normal respiratory effort w/o accessory muscle use, no dullness on percussion, no wheezing or rales CV - s1s2 regular rate and rhythm, no murmurs, no  peripheral edema, radial pulses symmetric GI - soft, non tender, no masses Lymph - no adenopathy noted in neck and axillary areas MSK - normal muscle strength and tone, normal gait Ext - no cyanosis, clubbing, or joint inflammation noted Skin - no rashes, lesions, or ulcers Neuro - oriented to person, place, and time Psych - normal mood and affect  Labs: CBC Latest Ref Rng & Units 03/28/2018 10/24/2017 10/13/2016  WBC 4.0 - 10.5 K/uL 7.2 4.8 5.6  Hemoglobin 13.0 - 17.0 g/dL 40.9 81.1 91.4  Hematocrit 39.0 - 52.0 % 46.9 46.8 46.0  Platelets 150.0 - 400.0 K/uL 182.0 172.0 186.0    CMP Latest Ref Rng & Units 03/28/2018 10/24/2017 01/06/2017  Glucose 70 - 99 mg/dL 782(N) 99 -  BUN 6 - 23 mg/dL 11 17 -  Creatinine 5.62 - 1.50 mg/dL 1.30 8.65 -  Sodium 784 - 145 mEq/L 136 140 -  Potassium 3.5 - 5.1 mEq/L 4.0 4.5 -  Chloride 96 - 112 mEq/L 102 103 -  CO2 19 - 32 mEq/L 27 31 -  Calcium 8.4 - 10.5 mg/dL 9.7 9.5 -  Total Protein 6.0 - 8.3 g/dL 7.5 6.7 7.5  Total Bilirubin 0.2 - 1.2 mg/dL 0.7 0.5 0.6  Alkaline Phos 39 - 117 U/L 77 73 88  AST 0 - 37 U/L 15 17 20   ALT 0 - 53 U/L 22 22 20    ProBNP  Component Value Date/Time   PROBNP 24.0 03/28/2018 1539    Discussion: He continues to have trouble with his breathing.  Some of this is likely related to allergies and asthma after he had influenza in February of 2019.  However, he has incomplete response to therapies.  He could also have progression of cardiac disease as a contributor to his symptoms.  He likely also a component of deconditioning.  Finally, he has history of sleep apnea on CPAP and could still be having oxygen desaturation while asleep.  Assessment/Plan:  Allergic asthma. - symbicort, singulair, prn albuterol - discussed different roles for his inhalers  Mild restriction and diffusion defect on recent PFT. - likely related to obesity - no evidence for emphysema or ILD  Dyspnea on exertion with history of SVT and paroxysmal a  fib and atherosclerosis on CT chest imaging. - will arrange for him to have f/u assessment with cardiology - ?if tambocor is contributing to his dyspnea  Obstructive sleep apnea. - CPAP managed by Dr. Vickey Huger with [facility] - will arrange for ONO with CPAP   Patient Instructions  Symbicort two puffs in the morning and two puffs at night Singulair 10 mg pill nightly Albuterol 2 puffs every 6 hours as needed for cough, wheeze, chest congestion, or shortness of breath Will arrange for overnight oxygen test with you using CPAP Will arrange for referral back to cardiology to further assess cause of your shortness of breath  Follow up in 8 weeks    Coralyn Helling, MD [facility] 04/27/2018, 12:49 PM Pager:  [phone]  Flow Sheet  Pulmonary tests: HRCT chest 10/21/17 >> atherosclerosis, scarring RML and lingula, hepatic steatosis PFT 10/31/17 >> FEV1 2.29 64%), FEV1% 83, TLC 5.59 (79%), DLCO 77%  Sleep tests: PSG 12/29/15 >> AHI 69.8, SpO2 low 89%  Cardiac tests: Echo 04/01/16 >> mild LVH, EF 55 to 60%   Past Medical History: He  has a past medical history of DDD (degenerative disc disease), lumbar, Diverticulosis, GERD (gastroesophageal reflux disease), Hyperlipidemia, Low sperm motility, Sleep apnea, and SVT (supraventricular tachycardia) (HCC).  Past Surgical History: He  has a past surgical history that includes Back surgery; Wisdom tooth extraction; Cardiac catheterization (N/A, 12/22/2015); Cardiac catheterization (N/A, 02/19/2016); Cardiac electrophysiology study and ablation; septal reconstruction (2017); and Turbinate reduction (Bilateral, 07/2016).  Family History: His family history includes Food Allergy in his son; Hypertension in his father.  Social History: He  reports that he quit smoking about 27 years ago. His smoking use included cigarettes. He has a 10.00 pack-year smoking history. He has never used smokeless tobacco. He reports that he  does not drink alcohol or use drugs.  Medications: Allergies as of 04/27/2018      Reactions   Tree Extract    Pork-derived Products Swelling   Statins    Muscle aches      Medication List        Accurate as of 04/27/18 12:49 PM. Always use your most recent med list.          albuterol 108 (90 Base) MCG/ACT inhaler Commonly known as:  PROVENTIL HFA;VENTOLIN HFA Inhale 1-2 puffs into the lungs every 6 (six) hours as needed for wheezing or shortness of breath.   aspirin 81 MG tablet Take 81 mg by mouth daily.   azelastine 0.1 % nasal spray Commonly known as:  ASTELIN 1-2 sprays per nostril 2 times daily as needed.   budesonide-formoterol 160-4.5 MCG/ACT inhaler Commonly known as:  SYMBICORT Inhale 2  puffs into the lungs 2 (two) times daily.   budesonide-formoterol 160-4.5 MCG/ACT inhaler Commonly known as:  SYMBICORT Inhale 2 puffs into the lungs 2 (two) times daily.   ezetimibe 10 MG tablet Commonly known as:  ZETIA Take 1 tablet (10 mg total) by mouth daily.   flecainide 50 MG tablet Commonly known as:  TAMBOCOR TAKE 1 TABLET BY MOUTH AS NEEDED FOR PALPITATIONS DESPITE TAKING METOPROLOL. NOT TO EXCEED 6 TABLETS IN 24 HOURS   HYDROcodone-acetaminophen 5-325 MG tablet Commonly known as:  NORCO/VICODIN Take 1 tablet by mouth every 6 (six) hours as needed for moderate pain.   levocetirizine 5 MG tablet Commonly known as:  XYZAL TAKE 1 TABLET(5 MG) BY MOUTH EVERY EVENING   metoprolol tartrate 25 MG tablet Commonly known as:  LOPRESSOR Take 1 tablet (25 mg total) by mouth 2 (two) times daily.   montelukast 10 MG tablet Commonly known as:  SINGULAIR Take 1 tablet once at night for coughing or wheezing.   pantoprazole 40 MG tablet Commonly known as:  PROTONIX TAKE 1 TABLET(40 MG) BY MOUTH TWICE DAILY BEFORE A MEAL   PRESCRIPTION MEDICATION Inhale into the lungs at bedtime. CPAP   sildenafil 20 MG tablet Commonly known as:  REVATIO Take 1-2 tablets (20-40 mg  total) by mouth as needed.

## 2018-05-12 ENCOUNTER — Ambulatory Visit: Payer: Medicaid Other | Admitting: Internal Medicine

## 2018-05-12 ENCOUNTER — Encounter: Payer: Self-pay | Admitting: Internal Medicine

## 2018-05-12 VITALS — BP 114/76 | HR 59 | Ht 68.0 in | Wt 260.0 lb

## 2018-05-12 DIAGNOSIS — R0600 Dyspnea, unspecified: Secondary | ICD-10-CM | POA: Diagnosis not present

## 2018-05-12 DIAGNOSIS — R0602 Shortness of breath: Secondary | ICD-10-CM | POA: Diagnosis not present

## 2018-05-12 DIAGNOSIS — I471 Supraventricular tachycardia, unspecified: Secondary | ICD-10-CM

## 2018-05-12 NOTE — Patient Instructions (Addendum)
Medication Instructions:  Your physician recommends that you continue on your current medications as directed. Please refer to the Current Medication list given to you today.  Labwork: None ordered.  Testing/Procedures: Your physician has recommended that you have a cardiopulmonary stress test (CPX). CPX testing is a non-invasive measurement of heart and lung function. It replaces a traditional treadmill stress test. This type of test provides a tremendous amount of information that relates not only to your present condition but also for future outcomes. This test combines measurements of you ventilation, respiratory gas exchange in the lungs, electrocardiogram (EKG), blood pressure and physical response before, during, and following an exercise protocol.  Please schedule for a CPX  Follow-Up: Your physician wants you to follow-up in:  Based on results of CPX.  Any Other Special Instructions Will Be Listed Below (If Applicable).  If you need a refill on your cardiac medications before your next appointment, please call your pharmacy.

## 2018-05-12 NOTE — Progress Notes (Signed)
HPI Mr. Lehnen returns today for followup of sob and dyslipidemia. He is a pleasant 62 yo man with a h/o SVT, s/p ablation, h/o lung disease, FEV1 of 64%, who returns today for followup of dyspnea. He has had SVT in the past and underwent catheter ablation. He developed PAF and has been on flecainide for his atrial fib. His main complaint today is that he develops dyspnea with exertion. He thinks that it is getting worse.  Allergies  Allergen Reactions  . Tree Extract   . Pork-Derived Products Swelling  . Statins     Muscle aches     Current Outpatient Medications  Medication Sig Dispense Refill  . albuterol (PROAIR HFA) 108 (90 Base) MCG/ACT inhaler Inhale 1-2 puffs into the lungs every 6 (six) hours as needed for wheezing or shortness of breath. 1 Inhaler 0  . aspirin 81 MG tablet Take 81 mg by mouth daily.    [name] azelastine (ASTELIN) 0.1 % nasal spray 1-2 sprays per nostril 2 times daily as needed. 30 mL 5  . budesonide-formoterol (SYMBICORT) 160-4.5 MCG/ACT inhaler Inhale 2 puffs into the lungs 2 (two) times daily. 1 Inhaler 6  . budesonide-formoterol (SYMBICORT) 160-4.5 MCG/ACT inhaler Inhale 2 puffs into the lungs 2 (two) times daily. 1 Inhaler 0  . ezetimibe (ZETIA) 10 MG tablet Take 1 tablet (10 mg total) by mouth daily. 30 tablet 3  . flecainide (TAMBOCOR) 50 MG tablet TAKE 1 TABLET BY MOUTH AS NEEDED FOR PALPITATIONS DESPITE TAKING METOPROLOL. NOT TO EXCEED 6 TABLETS IN 24 HOURS 45 tablet 3  . fluticasone (FLONASE) 50 MCG/ACT nasal spray Place 1 spray into both nostrils daily.    [name] HYDROcodone-acetaminophen (NORCO) 5-325 MG tablet Take 1 tablet by mouth every 6 (six) hours as needed for moderate pain. 6 tablet 0  . levocetirizine (XYZAL) 5 MG tablet TAKE 1 TABLET(5 MG) BY MOUTH EVERY EVENING 90 tablet 0  . metoprolol tartrate (LOPRESSOR) 25 MG tablet Take 1 tablet (25 mg total) by mouth 2 (two) times daily. 180 tablet 3  . montelukast (SINGULAIR) 10 MG tablet Take 1 tablet  once at night for coughing or wheezing. 30 tablet 1  . pantoprazole (PROTONIX) 40 MG tablet TAKE 1 TABLET(40 MG) BY MOUTH TWICE DAILY BEFORE A MEAL 60 tablet 0  . predniSONE (DELTASONE) 10 MG tablet Take 1 tablet by mouth daily.    [name] PRESCRIPTION MEDICATION Inhale into the lungs at bedtime. CPAP    . sildenafil (REVATIO) 20 MG tablet Take 1-2 tablets (20-40 mg total) by mouth as needed. 60 tablet 1   No current facility-administered medications for this visit.      Past Medical History:  Diagnosis Date  . DDD (degenerative disc disease), lumbar    L3/L4  . Diverticulosis   . GERD (gastroesophageal reflux disease)   . Hyperlipidemia   . Low sperm motility   . Sleep apnea    uses cpap   . SVT (supraventricular tachycardia) (HCC)     ROS:   All systems reviewed and negative except as noted in the HPI.   Past Surgical History:  Procedure Laterality Date  . BACK SURGERY    . CARDIAC CATHETERIZATION N/A 12/22/2015   Procedure: Left Heart Cath and Coronary Angiography;  Surgeon: Peter M Swaziland, MD;  Location: [facility];  Service: Cardiovascular;  Laterality: N/A;  . CARDIAC ELECTROPHYSIOLOGY STUDY AND ABLATION    . ELECTROPHYSIOLOGIC STUDY N/A 02/19/2016   Procedure: SVT Ablation;  Surgeon:  Marinus Maw, MD;  Location: [facility];  Service: Cardiovascular;  Laterality: N/A;  . septal reconstruction  2017  . TURBINATE REDUCTION Bilateral 07/2016  . WISDOM TOOTH EXTRACTION       Family History  Problem Relation Age of Onset  . Hypertension Father   . Food Allergy Son   . Colon cancer Neg Hx   . Stomach cancer Neg Hx   . Allergic rhinitis Neg Hx   . Angioedema Neg Hx   . Asthma Neg Hx   . Eczema Neg Hx   . Immunodeficiency Neg Hx   . Urticaria Neg Hx      Social History   Socioeconomic History  . Marital status: Married    Spouse name: Not on file  . Number of children: Not on file  . Years of education: Not on file  . Highest education level: Not  on file  Occupational History  . Not on file  Social Needs  . Financial resource strain: Not on file  . Food insecurity:    Worry: Not on file    Inability: Not on file  . Transportation needs:    Medical: Not on file    Non-medical: Not on file  Tobacco Use  . Smoking status: Former Smoker    Packs/day: 1.00    Years: 10.00    Pack years: 10.00    Types: Cigarettes    Last attempt to quit: 1992    Years since quitting: 27.7  . Smokeless tobacco: Never Used  . Tobacco comment: Uses Hookah Pipe occasionally  Substance and Sexual Activity  . Alcohol use: No  . Drug use: No  . Sexual activity: Yes  Lifestyle  . Physical activity:    Days per week: Not on file    Minutes per session: Not on file  . Stress: Not on file  Relationships  . Social connections:    Talks on phone: Not on file    Gets together: Not on file    Attends religious service: Not on file    Active member of club or organization: Not on file    Attends meetings of clubs or organizations: Not on file    Relationship status: Not on file  . Intimate partner violence:    Fear of current or ex partner: Not on file    Emotionally abused: Not on file    Physically abused: Not on file    Forced sexual activity: Not on file  Other Topics Concern  . Not on file  Social History Narrative   Marital Status: Married Engineer, drilling)   Children:  4 (3 Sons/1 Daughter)   Pets:  None    Living Situation: Lives with spouse and 4 children   Origin:  He was born in [place].   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     Alcohol Use:  None   Drug Use:  None   Diet:  Regular   Exercise:  None   Hobbies:  Cards     BP 114/76   Pulse (!) 59   Ht 5\' 8"  (1.727 m)   Wt 260 lb (117.9 kg)   SpO2 96%   BMI 39.53 kg/m   Physical Exam:  Overweight but well appearing 62 yo man, NAD HEENT:  Unremarkable Neck:  6 cm JVD, no thyromegally Lymphatics:  No adenopathy Back:  No CVA tenderness Lungs:  Clear with no wheezes HEART:  Regular rate rhythm, no murmurs, no rubs, no clicks Abd:  soft, positive bowel sounds, no organomegally, no rebound, no guarding Ext:  2 plus pulses, no edema, no cyanosis, no clubbing Skin:  No rashes no nodules Neuro:  CN II through XII intact, motor grossly intact  EKG - sinus bradycardia   Assess/Plan: 1. Dyspnea with exertion - I have recommended he undergo cardiopulmonary stress test. We will schedule as soon as he is able. 2. PAF - he is on low dose flecainide. He has CHADSVASC score of 1.  3. HTN - his blood pressure is well controlled today.  4. Dyslipidemia - he will continue his statin therapy.  Leonia ReevesGregg Taylor,M.D.

## 2018-05-16 ENCOUNTER — Emergency Department (HOSPITAL_BASED_OUTPATIENT_CLINIC_OR_DEPARTMENT_OTHER): Payer: Medicaid Other

## 2018-05-16 ENCOUNTER — Other Ambulatory Visit: Payer: Self-pay

## 2018-05-16 ENCOUNTER — Encounter (HOSPITAL_BASED_OUTPATIENT_CLINIC_OR_DEPARTMENT_OTHER): Payer: Self-pay

## 2018-05-16 ENCOUNTER — Ambulatory Visit: Payer: Self-pay

## 2018-05-16 ENCOUNTER — Emergency Department (HOSPITAL_BASED_OUTPATIENT_CLINIC_OR_DEPARTMENT_OTHER)
Admission: EM | Admit: 2018-05-16 | Discharge: 2018-05-16 | Disposition: A | Discharge status: Home / Self Care | Payer: Medicaid Other | Attending: Emergency Medicine | Admitting: Emergency Medicine

## 2018-05-16 DIAGNOSIS — Z87891 Personal history of nicotine dependence: Secondary | ICD-10-CM | POA: Diagnosis not present

## 2018-05-16 DIAGNOSIS — S6702XA Crushing injury of left thumb, initial encounter: Secondary | ICD-10-CM | POA: Insufficient documentation

## 2018-05-16 DIAGNOSIS — S6710XA Crushing injury of unspecified finger(s), initial encounter: Secondary | ICD-10-CM

## 2018-05-16 DIAGNOSIS — Z7982 Long term (current) use of aspirin: Secondary | ICD-10-CM | POA: Insufficient documentation

## 2018-05-16 DIAGNOSIS — Y929 Unspecified place or not applicable: Secondary | ICD-10-CM | POA: Diagnosis not present

## 2018-05-16 DIAGNOSIS — Z79899 Other long term (current) drug therapy: Secondary | ICD-10-CM | POA: Insufficient documentation

## 2018-05-16 DIAGNOSIS — Y939 Activity, unspecified: Secondary | ICD-10-CM | POA: Diagnosis not present

## 2018-05-16 DIAGNOSIS — Y999 Unspecified external cause status: Secondary | ICD-10-CM | POA: Diagnosis not present

## 2018-05-16 DIAGNOSIS — W270XXA Contact with workbench tool, initial encounter: Secondary | ICD-10-CM | POA: Insufficient documentation

## 2018-05-16 MED ORDER — HYDROCODONE-ACETAMINOPHEN 5-325 MG PO TABS: 1.0000 | ORAL_TABLET | Freq: Four times a day (QID) | ORAL | 0 refills | Status: DC | PRN

## 2018-05-16 MED ORDER — IBUPROFEN 800 MG PO TABS
800.0000 mg | ORAL_TABLET | Freq: Once | ORAL | Status: AC
  Administered 2018-05-16: 800 mg via ORAL
  Filled 2018-05-16: qty 1

## 2018-05-16 NOTE — ED Triage Notes (Signed)
Pt smashed left thumb with hammer about twenty minutes ago

## 2018-05-16 NOTE — Discharge Instructions (Signed)
1.  Keep your hand elevated and a dressing on.  Apply well wrapped ice for 20 minutes at a time every hour or 2.  Do not allow the ice to sit on your skin or too long and injuring her finger. 2.  Take Vicodin if needed for pain control. 3.  Do not cut or puncture the swollen area on your thumb.  Allow this to start to heal on its own.  If the skin becomes very thin over the blood collection and breaks that is okay.  Continue to clean the wound carefully twice a day and keep it covered.

## 2018-05-16 NOTE — Telephone Encounter (Signed)
ret'd call to pt.  Reported he injured left thumb by hitting it with a hammer.  Stated it was bleeding initially, and able to apply pressure to stop the bleeding.  Reported the thumb is very swollen, bruised, and has increased bleeding under the skin. Reported has increased in size, since incident happened 30 " prior to call.    Unable to understand, by pt's description, if there is any laceration, due to language barrier/ difficult to understand.  Stated the skin is "very stretched."  C/o mild to moderate pain.  Denied any other injury.  Advised to go to the ER for eval./ treatment at this time.  Advised to have another adult to drive him.  Stated he has no one to drive him, but is next door to the ER at [facility].   Reason for Disposition . [1] MODERATE-SEVERE pain AND [2] blood present under a nail    Reported hit left thumb with hammer about 30 min. ago.  Stated applied pressure and able to stop the bleeding.  Noted increased swelling, bruising and bleeding beneath the skin of the tip, and inner aspect of thumb.  Reported pain is mild to moderate.  Answer Assessment - Initial Assessment Questions 1. MECHANISM: "How did the injury happen?"      Left thumb hit with hammer  2. ONSET: "When did the injury happen?" (Minutes or hours ago)      About 30 minutes ago  3. LOCATION: "What part of the finger is injured?" "Is the nail damaged?"      Left thumb; inner aspect  4. APPEARANCE of the INJURY: "What does the injury look like?"      Very swollen and bruising; bleeding under skin and has increased in size    5. SEVERITY: "Can you use the hand normally?"  "Can you bend your fingers into a ball and then fully open them?"     Able to move thumb, but tip is painful 6. SIZE: For cuts, bruises, or swelling, ask: "How large is it?" (e.g., inches or centimeters;  entire finger)      Very swollen/ bruised/ blood collected under the skin 7. PAIN: "Is there pain?" If so, ask: "How bad is the  pain?"    (e.g., Scale 1-10; or mild, moderate, severe)     Mild-moderate 8. TETANUS: For any breaks in the skin, ask: "When was the last tetanus booster?"     Yes about 2 mos. Ago  9. OTHER SYMPTOMS: "Do you have any other symptoms?"     denied  Protocols used: FINGER INJURY-A-AH

## 2018-05-16 NOTE — ED Provider Notes (Signed)
[facility] EMERGENCY DEPARTMENT Provider Note   CSN: [id number] Arrival date & time: 05/16/18  1626     History   Chief Complaint Chief Complaint  Patient presents with  . Finger Injury    HPI Richard Walker is a 62 y.o. male.  HPI Patient was working with a Psychologist, clinicalhammer and struck his left thumb.  This occurred about 20 minutes prior to arrival.  Has got a large swollen area on the lateral aspect of the thumb.  He reports that he thought it might need to be cut and drained.  His tetanus is up-to-date.  Last given 2 months ago.  He denies any allergy or intolerance to ibuprofen or Vicodin. Past Medical History:  Diagnosis Date  . DDD (degenerative disc disease), lumbar    L3/L4  . Diverticulosis   . GERD (gastroesophageal reflux disease)   . Hyperlipidemia   . Low sperm motility   . Sleep apnea    uses cpap   . SVT (supraventricular tachycardia) [facility](HCC)     Patient Active Problem List   Diagnosis Date Noted  . Perennial and seasonal allergic rhinitis 11/09/2017  . History of food allergy 11/09/2017  . Wheezing 11/09/2017  . Dyspnea/wheezing 10/31/2017  . Cough 10/31/2017  . Bronchitis, mucopurulent recurrent (HCC) 09/07/2017  . SVT (supraventricular tachycardia) (HCC) 04/23/2015  . Abdominal pain, epigastric 06/03/2013  . Unspecified vitamin D deficiency 04/09/2013  . Esophageal reflux 04/09/2013  . Hepatitis B carrier (HCC) 04/09/2013  . Other and unspecified hyperlipidemia 04/09/2013  . Unspecified sleep apnea 04/09/2013    Past Surgical History:  Procedure Laterality Date  . BACK SURGERY    . CARDIAC CATHETERIZATION N/A 12/22/2015   Procedure: Left Heart Cath and Coronary Angiography;  Surgeon: Peter M SwazilandJordan, MD;  [facility];  Service: Cardiovascular;  Laterality: N/A;  . CARDIAC ELECTROPHYSIOLOGY STUDY AND ABLATION    . ELECTROPHYSIOLOGIC STUDY N/A 02/19/2016   Procedure: SVT Ablation;  Surgeon: Marinus MawGregg W Taylor, MD;  [facility]  [facility];  Service: Cardiovascular;  Laterality: N/A;  . septal reconstruction  2017  . TURBINATE REDUCTION Bilateral 07/2016  . WISDOM TOOTH EXTRACTION          Home Medications    Prior to Admission medications   Medication Sig Start Date End Date Taking? Authorizing Provider  albuterol (PROAIR HFA) 108 (90 Base) MCG/ACT inhaler Inhale 1-2 puffs into the lungs every 6 (six) hours as needed for wheezing or shortness of breath. 03/28/18   Olive BassMurray, Laura Woodruff, FNP  aspirin 81 MG tablet Take 81 mg by mouth daily.    [provider]  azelastine (ASTELIN) 0.1 % nasal spray 1-2 sprays per nostril 2 times daily as needed. 11/09/17   Bobbitt, Heywood Ilesalph Carter, MD  budesonide-formoterol (SYMBICORT) 160-4.5 MCG/ACT inhaler Inhale 2 puffs into the lungs 2 (two) times daily. 04/27/18   Coralyn HellingSood, Vineet, MD  budesonide-formoterol (SYMBICORT) 160-4.5 MCG/ACT inhaler Inhale 2 puffs into the lungs 2 (two) times daily. 04/27/18   Coralyn HellingSood, Vineet, MD  ezetimibe (ZETIA) 10 MG tablet Take 1 tablet (10 mg total) by mouth daily. 10/26/17   Saguier, Ramon DredgeEdward, PA-C  flecainide (TAMBOCOR) 50 MG tablet TAKE 1 TABLET BY MOUTH AS NEEDED FOR PALPITATIONS DESPITE TAKING METOPROLOL. NOT TO EXCEED 6 TABLETS IN 24 HOURS 01/12/18   Marinus Mawaylor, Gregg W, MD  fluticasone [facility](FLONASE) 50 MCG/ACT nasal spray Place 1 spray into both nostrils daily.    [provider]  HYDROcodone-acetaminophen (NORCO) 5-325 MG tablet Take  1 tablet by mouth every 6 (six) hours as needed for moderate pain. 02/20/18   Saguier, Ramon Dredge, PA-C  HYDROcodone-acetaminophen (NORCO/VICODIN) 5-325 MG tablet Take 1-2 tablets by mouth every 6 (six) hours as needed for moderate pain or severe pain. 05/16/18   Arby Barrette, MD  levocetirizine (XYZAL) 5 MG tablet TAKE 1 TABLET(5 MG) BY MOUTH EVERY EVENING 12/30/17   Saguier, Ramon Dredge, PA-C  metoprolol tartrate (LOPRESSOR) 25 MG tablet Take 1 tablet (25 mg total) by mouth 2 (two) times daily. 07/27/17   Marinus Maw, MD    montelukast (SINGULAIR) 10 MG tablet Take 1 tablet once at night for coughing or wheezing. 03/28/18   Olive Bass, FNP  pantoprazole (PROTONIX) 40 MG tablet TAKE 1 TABLET(40 MG) BY MOUTH TWICE DAILY BEFORE A MEAL 11/21/17   Danis, Andreas Blower, MD  predniSONE (DELTASONE) 10 MG tablet Take 1 tablet by mouth daily.    [provider]  PRESCRIPTION MEDICATION Inhale into the lungs at bedtime. CPAP    [provider]  sildenafil (REVATIO) 20 MG tablet Take 1-2 tablets (20-40 mg total) by mouth as needed. 07/27/17   Marinus Maw, MD    Family History Family History  Problem Relation Age of Onset  . Hypertension Father   . Food Allergy Son   . Colon cancer Neg Hx   . Stomach cancer Neg Hx   . Allergic rhinitis Neg Hx   . Angioedema Neg Hx   . Asthma Neg Hx   . Eczema Neg Hx   . Immunodeficiency Neg Hx   . Urticaria Neg Hx     Social History Social History   Tobacco Use  . Smoking status: Former Smoker    Packs/day: 1.00    Years: 10.00    Pack years: 10.00    Types: Cigarettes    Last attempt to quit: 1992    Years since quitting: 27.7  . Smokeless tobacco: Never Used  . Tobacco comment: Uses Hookah Pipe occasionally  Substance Use Topics  . Alcohol use: No  . Drug use: No     Allergies   Tree extract; Pork-derived products; and Statins   Review of Systems Review of Systems Constitutional: No fever no chills no malaise  Physical Exam Updated Vital Signs BP 124/81 (BP Location: Right Arm)   Pulse 70   Temp 98.4 F (36.9 C) (Oral)   Resp 16   Ht 5\' 8"  (1.727 m)   Wt 117.9 kg   SpO2 99%   BMI 39.53 kg/m   Physical Exam  Constitutional:  Patient alert and nontoxic no distress.  HENT:  Head: Normocephalic and atraumatic.  Eyes: EOM are normal.  Pulmonary/Chest: Effort normal.  Neurological: He is alert.  Skin: Skin is warm and dry.  Psychiatric: He has a normal mood and affect.         ED Treatments / Results  Labs (all  labs ordered are listed, but only abnormal results are displayed) Labs Reviewed - No data to display  EKG None  Radiology Dg Finger Thumb Left  Result Date: 05/16/2018 CLINICAL DATA:  Crush injury with hammer EXAM: LEFT THUMB 2+V COMPARISON:  None. FINDINGS: There is no evidence of fracture or dislocation. There is no evidence of arthropathy or other focal bone abnormality. Soft tissues are unremarkable IMPRESSION: Negative. Electronically Signed   By: Charlett Nose M.D.   On: 05/16/2018 17:15    Procedures Procedures (including critical care time)  Medications Ordered in ED Medications  ibuprofen (ADVIL,MOTRIN) tablet 800 mg (has no administration in time range)     Initial Impression / Assessment and Plan / ED Course  I have reviewed the triage vital signs and the nursing notes.  Pertinent labs & imaging results that were available during my care of the patient were reviewed by me and considered in my medical decision making (see chart for details).    X-ray negative.  Findings consistent with soft tissue crush injury.  Patient counseled on not incising or puncturing the area of swelling at this time.  He is counseled on cleaning the wound elevating, judicious icing and pain control.  Final Clinical Impressions(s) / ED Diagnoses   Final diagnoses:  Crushing injury of finger, initial encounter    ED Discharge Orders         Ordered    HYDROcodone-acetaminophen (NORCO/VICODIN) 5-325 MG tablet  Every 6 hours PRN     05/16/18 1816           Arby Barrette, MD 05/16/18 1824

## 2018-05-24 ENCOUNTER — Ambulatory Visit (HOSPITAL_COMMUNITY): Payer: Medicaid Other | Attending: Internal Medicine

## 2018-05-24 ENCOUNTER — Other Ambulatory Visit (HOSPITAL_COMMUNITY): Payer: Self-pay | Admitting: *Deleted

## 2018-05-24 DIAGNOSIS — R0600 Dyspnea, unspecified: Secondary | ICD-10-CM | POA: Diagnosis present

## 2018-05-30 ENCOUNTER — Encounter: Payer: Self-pay | Admitting: Adult Health

## 2018-05-30 ENCOUNTER — Encounter: Payer: Self-pay | Admitting: Pulmonary Disease

## 2018-05-31 ENCOUNTER — Telehealth: Payer: Self-pay

## 2018-05-31 NOTE — Telephone Encounter (Signed)
Notes recorded by Sigurd Sos, RN on 05/31/2018 at 10:10 AM EDT The patient has been notified of the result and verbalized understanding. All questions (if any) were answered. Sigurd Sos, RN 05/31/2018 10:10 AM

## 2018-05-31 NOTE — Telephone Encounter (Signed)
-----   Message from Marinus Maw, MD sent at 05/27/2018  4:52 PM EDT ----- The patient's stress test demonstrates his weight and restrictive lung disease are making him short of breath. He needs to start walking, eat a low salt diet, and lose weight. GT

## 2018-06-01 ENCOUNTER — Telehealth: Payer: Self-pay | Admitting: Internal Medicine

## 2018-06-01 ENCOUNTER — Telehealth: Payer: Self-pay | Admitting: Neurology

## 2018-06-01 NOTE — Telephone Encounter (Signed)
Spoke with the patient, he woke up and took his blood pressure and it was normal and he had a heart rate of 48. He was very weak and could barely stand up. He slowly started to walk around in hopes to increase his heart rate. It is up to high 50s now and he is feeling good, just a slight headache. The patient stated he woke up in the middle of the night with a dry mouth. Advised the patient to properly hydrate. Spoke with Dr. Eldridge Dace (DOD), he advised the patient to monitor his blood pressure and heart rate for the next few days and let our office know if this occurs again and the patient should properly hydrate. In addition, the patient should hold his metoprolol this evening and restart like normal tomorrow.   Spoke with the patient and he accepted and had no further questions.

## 2018-06-01 NOTE — Telephone Encounter (Signed)
New Message   STAT if HR is under 50 or over 120 (normal HR is 60-100 beats per minute)  1) What is your heart rate? 48  2) Do you have a log of your heart rate readings (document readings)? 40-50 and 51 being the highest  3) Do you have any other symptoms? Low energy, can barely stand

## 2018-06-01 NOTE — Telephone Encounter (Signed)
Noted.    Await further needs. 

## 2018-06-01 NOTE — Telephone Encounter (Signed)
Pt requesting a call stating he is having trouble with [employer] would like Dr. Vickey Huger to send the CPAP supplies elsewhere if possible, please advise

## 2018-06-02 NOTE — Telephone Encounter (Signed)
Pt called stating the main issue he has with [employer] is they are wanting to charge the pt for his CPAP since they ran his insurance under [employer]. Pt states he no longer has BCBS but now has Medicare, if able to run the machine under new insurance pt is ok with staying under [employer].

## 2018-06-02 NOTE — Telephone Encounter (Signed)
Called the patient, no answer. LVM informing the patient to call back.  If pt calls back I will talk with him, if I am unavailable at the time of his call.  Please discuss what problems he is having with [employer]. We can certainly transfer him to another company if he is insisting however if there is anyway we can trouble shoot on his behalf I would rather attempt that first. We have a connection with [employer] and I have the capability of calling the manager and speaking on behalf of the patient where I don't have that capability with other companies. If he would like to leave what his complaints are I can send them to the manager and have him follow up.   If pt insistent on transferring to another company we can offer transfer to [facility] or [employer], just make aware that we don't have that connection with their leadership team if in event there are issues.

## 2018-06-07 ENCOUNTER — Other Ambulatory Visit: Payer: Self-pay | Admitting: Family

## 2018-06-07 ENCOUNTER — Other Ambulatory Visit: Payer: Self-pay | Admitting: Internal Medicine

## 2018-06-07 ENCOUNTER — Telehealth: Payer: Self-pay | Admitting: Pulmonary Disease

## 2018-06-07 NOTE — Telephone Encounter (Signed)
Should this be sent in for qd prn? Please advise. Thanks, MI

## 2018-06-07 NOTE — Telephone Encounter (Signed)
Attempted to call patient today regarding results. I did not receive an answer at time of call. I have left a voicemail message for pt to return call. X1  

## 2018-06-07 NOTE — Telephone Encounter (Signed)
ONO with CPAP 05/31/18 >> test time 8 hrs 54 min.  Baseline SpO2 95.9%, low SpO2 93%.   Please let him know that his overnight oxygen test showed good control of oxygen at night while using CPAP.

## 2018-06-08 MED ORDER — FLECAINIDE ACETATE 50 MG PO TABS: ORAL_TABLET | ORAL | 3 refills | Status: DC

## 2018-06-08 NOTE — Telephone Encounter (Signed)
Called and spoke with patient regarding results.  Informed the patient of results and recommendations today. Pt verbalized understanding and denied any questions or concerns at this time.  Nothing further needed.  

## 2018-06-08 NOTE — Telephone Encounter (Signed)
Called the pt and advised him that he should contact [employer] and give them the new insurance information. Advised the patient that per medicare guidelines he is required to follow up with [place] and assess compliance sooner then January. The earliest apt I have was nov 7th at 11 am. Pt states his main issue is he is in need of supplies and having mask leaks. I advised I will make [employer] aware and told him to be on the look out from them. Pt verbalized understanding.

## 2018-06-19 ENCOUNTER — Ambulatory Visit: Payer: Medicaid Other | Admitting: Podiatry

## 2018-06-19 ENCOUNTER — Other Ambulatory Visit: Payer: Self-pay

## 2018-06-19 DIAGNOSIS — M7752 Other enthesopathy of left foot: Secondary | ICD-10-CM | POA: Diagnosis not present

## 2018-06-19 DIAGNOSIS — M19079 Primary osteoarthritis, unspecified ankle and foot: Secondary | ICD-10-CM

## 2018-06-19 DIAGNOSIS — M7751 Other enthesopathy of right foot: Secondary | ICD-10-CM | POA: Diagnosis not present

## 2018-06-19 MED ORDER — METHYLPREDNISOLONE 4 MG PO TBPK: ORAL_TABLET | ORAL | 0 refills | Status: DC

## 2018-06-19 NOTE — Patient Instructions (Addendum)
Insoles/Arch Supports  [employer] on Radiation protection practitioner  OR  [employer]ist, product/process development on [employer]

## 2018-06-20 NOTE — Progress Notes (Signed)
   HPI: 62 year old male presenting today as a new patient with a chief complaint of right foot pain that began that began a few weeks ago. He states he wants a second opinion and is trying to avoid surgery if possible. He has received X-Rays when he was evaluated by ortho. H states the pain radiates up into his right ankle. Walking increases the pain and he has been taking OTC Motrin for treatment. He is interested in custom orthotics. Patient is here for further evaluation and treatment.   Past Medical History:  Diagnosis Date  . DDD (degenerative disc disease), lumbar    L3/L4  . Diverticulosis   . GERD (gastroesophageal reflux disease)   . Hyperlipidemia   . Low sperm motility   . Sleep apnea    uses cpap   . SVT (supraventricular tachycardia) (HCC)      Physical Exam: General: The patient is alert and oriented x3 in no acute distress.  Dermatology: Skin is warm, dry and supple bilateral lower extremities. Negative for open lesions or macerations.  Vascular: Palpable pedal pulses bilaterally. No edema or erythema noted. Capillary refill within normal limits.  Neurological: Epicritic and protective threshold grossly intact bilaterally.   Musculoskeletal Exam: Pain with palpation to the 1st MPJ bilaterally. Range of motion within normal limits to all pedal and ankle joints bilateral. Muscle strength 5/5 in all groups bilateral.   Assessment: 1. 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 Patient evaluated. X-Rays reviewed.  2. Injection of 0.5 mLs Celestone Soluspan injected into the 1st MPJs bilaterally.  3. Prescription for Medrol Dose Pak provided to patient. Resume taking OTC Motrin afterwards.  4. Recommended OTC insoles.  5. Return to clinic in 4 weeks.      Felecia Shelling, DPM [facility]  Dr. Felecia Shelling, DPM    [address] [place], [place] [address]                Office [phone]  Fax  [phone]

## 2018-06-27 ENCOUNTER — Ambulatory Visit: Payer: Self-pay | Admitting: *Deleted

## 2018-06-27 NOTE — Telephone Encounter (Signed)
Pt called with experiencing fatigue and some dizziness, which started yesterday.  He denies chest pain, numbness or tingling in his extremities.  He denies fever, vomiting but has had  some nausea.  Today he went to work but had to come back home and has been lying around since 10 am. He is requesting to come in and have labs drawn.  He did start taking a magnesium supplement yesterday but that was after his current symptoms. Recommended he goes to the nearest ED if his symptoms increase. Pt voiced understanding. Appointment scheduled per protocol.  Reason for Disposition . [1] MODERATE weakness (i.e., interferes with work, school, normal activities) AND [2] persists > 3 days  Answer Assessment - Initial Assessment Questions 1. DESCRIPTION: "Describe how you are feeling."     fatigue 2. SEVERITY: "How bad is it?"  "Can you stand and walk?"   - MILD - Feels weak or tired, but does not interfere with work, school or normal activities   - MODERATE - Able to stand and walk; weakness interferes with work, school, or normal activities   - SEVERE - Unable to stand or walk     moderate 3. ONSET:  "When did the weakness begin?"     yesterday 4. CAUSE: "What do you think is causing the weakness?"     Not sure, wants to have labs drawn 5. MEDICINES: "Have you recently started a new medicine or had a change in the amount of a medicine?"    Started taking magnesium yesterday 6. OTHER SYMPTOMS: "Do you have any other symptoms?" (e.g., chest pain, fever, cough, SOB, vomiting, diarrhea, bleeding, other areas of pain)     Not SOB but not normal, nausea,  Protocols used: WEAKNESS (GENERALIZED) AND FATIGUE-A-AH

## 2018-06-28 ENCOUNTER — Encounter: Payer: Self-pay | Admitting: Medical

## 2018-06-28 ENCOUNTER — Ambulatory Visit (INDEPENDENT_AMBULATORY_CARE_PROVIDER_SITE_OTHER): Payer: Medicaid Other | Admitting: Medical

## 2018-06-28 VITALS — BP 122/80 | HR 70 | Temp 97.9°F | Resp 16 | Ht 68.0 in | Wt 265.0 lb

## 2018-06-28 DIAGNOSIS — R350 Frequency of micturition: Secondary | ICD-10-CM | POA: Diagnosis not present

## 2018-06-28 DIAGNOSIS — R319 Hematuria, unspecified: Secondary | ICD-10-CM

## 2018-06-28 DIAGNOSIS — R252 Cramp and spasm: Secondary | ICD-10-CM | POA: Diagnosis not present

## 2018-06-28 DIAGNOSIS — E669 Obesity, unspecified: Secondary | ICD-10-CM

## 2018-06-28 DIAGNOSIS — R5383 Other fatigue: Secondary | ICD-10-CM | POA: Diagnosis not present

## 2018-06-28 DIAGNOSIS — R8290 Unspecified abnormal findings in urine: Secondary | ICD-10-CM

## 2018-06-28 LAB — POC URINALSYSI DIPSTICK (AUTOMATED)
BILIRUBIN UA: NEGATIVE
Glucose, UA: NEGATIVE
KETONES UA: NEGATIVE
Leukocytes, UA: NEGATIVE
Nitrite, UA: NEGATIVE
Protein, UA: NEGATIVE
SPEC GRAV UA: 1.015 (ref 1.010–1.025)
Urobilinogen, UA: 0.2 E.U./dL
pH, UA: 7 (ref 5.0–8.0)

## 2018-06-28 NOTE — Progress Notes (Signed)
Subjective:    Patient ID: Richard Walker, male    DOB: [birth year], 62 y.o.   MRN: [id number]  HPI Pt in for some recent severe fatigue for 4 days.  Then states he had some fatigue even before last 4 days.(lasting up to weeks).   He states last 2 days improved compared to 4 days ago. Occasional rare transient dizziness.(no ha or gross motor/sensory deficits reported).  Pt updates me that he has been using his cpap. He states has been compliant on Cpap.  Also had cardo pulmonsty test which he summarizes was negative.   Conclusion: The interpretation of this test is limited due to submaximal effort during the exercise. Based on available data, exercise testing with gas exchange demonstrates low-normal functional capacity when compared to matched sedentary norms. There is no clear cardiac limitation. At peak exercise patient is ventilatory limited due to his body habitus and related restrictive lung physiology. There was mild chronotropic incompetence most likely in relation to his submaximal effort for exercise.  Pt has high cholesterol. He states he has side effect with statin. Not taking zetia. He wants to consider Repatha.    Review of Systems  Constitutional: Positive for fatigue. Negative for chills and fever.  Respiratory: Negative for cough, chest tightness, shortness of breath and wheezing.   Cardiovascular: Negative for chest pain and palpitations.  Gastrointestinal: Negative for abdominal pain, diarrhea, nausea and vomiting.  Genitourinary: Positive for frequency. Negative for difficulty urinating, dysuria, penile pain, testicular pain and urgency.  Musculoskeletal: Negative for back pain, myalgias, neck pain and neck stiffness.  Skin: Negative for pallor and rash.  Neurological: Negative for syncope, speech difficulty, weakness, light-headedness and numbness.       On and off dizziness. But did not report any to me today. Also no ha reported today.  Hematological: Negative  for adenopathy. Does not bruise/bleed easily.  Psychiatric/Behavioral: Negative for behavioral problems, confusion and hallucinations. The patient is not nervous/anxious.     Past Medical History:  Diagnosis Date  . DDD (degenerative disc disease), lumbar    L3/L4  . Diverticulosis   . GERD (gastroesophageal reflux disease)   . Hyperlipidemia   . Low sperm motility   . Sleep apnea    uses cpap   . SVT (supraventricular tachycardia) (HCC)      Social History   Socioeconomic History  . Marital status: Married    Spouse name: Not on file  . Number of children: Not on file  . Years of education: Not on file  . Highest education level: Not on file  Occupational History  . Not on file  Social Needs  . Financial resource strain: Not on file  . Food insecurity:    Worry: Not on file    Inability: Not on file  . Transportation needs:    Medical: Not on file    Non-medical: Not on file  Tobacco Use  . Smoking status: Former Smoker    Packs/day: 1.00    Years: 10.00    Pack years: 10.00    Types: Cigarettes    Last attempt to quit: 1992    Years since quitting: 27.8  . Smokeless tobacco: Never Used  . Tobacco comment: Uses Hookah Pipe occasionally  Substance and Sexual Activity  . Alcohol use: No  . Drug use: No  . Sexual activity: Yes  Lifestyle  . Physical activity:    Days per week: Not on file    Minutes per session: Not on file  .  Stress: Not on file  Relationships  . Social connections:    Talks on phone: Not on file    Gets together: Not on file    Attends religious service: Not on file    Active member of club or organization: Not on file    Attends meetings of clubs or organizations: Not on file    Relationship status: Not on file  . Intimate partner violence:    Fear of current or ex partner: Not on file    Emotionally abused: Not on file    Physically abused: Not on file    Forced sexual activity: Not on file  Other Topics Concern  . Not on file    Social History Narrative   Marital Status: Married Engineer, drilling)   Children:  4 (3 Sons/1 Daughter)   Pets:  None    Living Situation: Lives with spouse and 4 children   Origin:  He was born in [place].   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     Alcohol Use:  None   Drug Use:  None   Diet:  Regular   Exercise:  None   Hobbies:  Cards    Past Surgical History:  Procedure Laterality Date  . BACK SURGERY    . CARDIAC CATHETERIZATION N/A 12/22/2015   Procedure: Left Heart Cath and Coronary Angiography;  Surgeon: Peter M Swaziland, MD;  Location: [facility] INVASIVE CV LAB;  Service: Cardiovascular;  Laterality: N/A;  . CARDIAC ELECTROPHYSIOLOGY STUDY AND ABLATION    . ELECTROPHYSIOLOGIC STUDY N/A 02/19/2016   Procedure: SVT Ablation;  Surgeon: Marinus Maw, MD;  Location: [facility] INVASIVE CV LAB;  Service: Cardiovascular;  Laterality: N/A;  . septal reconstruction  2017  . TURBINATE REDUCTION Bilateral 07/2016  . WISDOM TOOTH EXTRACTION      Family History  Problem Relation Age of Onset  . Hypertension Father   . Food Allergy Son   . Colon cancer Neg Hx   . Stomach cancer Neg Hx   . Allergic rhinitis Neg Hx   . Angioedema Neg Hx   . Asthma Neg Hx   . Eczema Neg Hx   . Immunodeficiency Neg Hx   . Urticaria Neg Hx     Allergies  Allergen Reactions  . Tree Extract   . Pork-Derived Products Swelling  . Statins     Muscle aches    Current Outpatient Medications on File Prior to Visit  Medication Sig Dispense Refill  . albuterol (PROAIR HFA) 108 (90 Base) MCG/ACT inhaler Inhale 1-2 puffs into the lungs every 6 (six) hours as needed for wheezing or shortness of breath. 1 Inhaler 0  . aspirin 81 MG tablet Take 81 mg by mouth daily.    . budesonide-formoterol (SYMBICORT) 160-4.5 MCG/ACT inhaler Inhale 2 puffs into the lungs 2 (two) times daily. 1 Inhaler 0  .  ezetimibe (ZETIA) 10 MG tablet Take 1 tablet (10 mg total) by mouth daily. 30 tablet 3  . famotidine-calcium carbonate-magnesium hydroxide (PEPCID COMPLETE) 10-800-165 MG chewable tablet Chew by mouth.    . flecainide (TAMBOCOR) 50 MG tablet TAKE 1 TABLET BY MOUTH AS NEEDED FOR PALPITATIONS DESPITE TAKING METOPROLOL. NOT TO EXCEED 6 TABLETS IN 24 HOURS 45 tablet 3  . fluticasone (FLONASE) 50 MCG/ACT nasal spray Place 1 spray into both nostrils daily.    [name] levocetirizine (XYZAL) 5 MG tablet TAKE 1 TABLET(5 MG) BY MOUTH  EVERY EVENING 90 tablet 0  . methylPREDNISolone (MEDROL DOSEPAK) 4 MG TBPK tablet 6 day dose pack - take as directed 21 tablet 0  . metoprolol tartrate (LOPRESSOR) 25 MG tablet TAKE 1 TABLET(25 MG) BY MOUTH TWICE DAILY 180 tablet 2  . montelukast (SINGULAIR) 10 MG tablet Take 1 tablet once at night for coughing or wheezing. 30 tablet 1  . pantoprazole (PROTONIX) 40 MG tablet TAKE 1 TABLET(40 MG) BY MOUTH TWICE DAILY BEFORE A MEAL 60 tablet 0  . PRESCRIPTION MEDICATION Inhale into the lungs at bedtime. CPAP    . azelastine (ASTELIN) 0.1 % nasal spray 1-2 sprays per nostril 2 times daily as needed. (Patient not taking: Reported on 06/28/2018) 30 mL 5  . budesonide-formoterol (SYMBICORT) 160-4.5 MCG/ACT inhaler Inhale 2 puffs into the lungs 2 (two) times daily. (Patient not taking: Reported on 06/28/2018) 1 Inhaler 6  . HYDROcodone-acetaminophen (NORCO) 5-325 MG tablet Take 1 tablet by mouth every 6 (six) hours as needed for moderate pain. (Patient not taking: Reported on 06/28/2018) 6 tablet 0  . HYDROcodone-acetaminophen (NORCO/VICODIN) 5-325 MG tablet Take 1-2 tablets by mouth every 6 (six) hours as needed for moderate pain or severe pain. (Patient not taking: Reported on 06/28/2018) 20 tablet 0  . predniSONE (DELTASONE) 10 MG tablet Take 1 tablet by mouth daily.    . sildenafil (REVATIO) 20 MG tablet Take 1-2 tablets (20-40 mg total) by mouth as needed. (Patient not taking: Reported on  06/28/2018) 60 tablet 1   No current facility-administered medications on file prior to visit.     BP 122/80 (BP Location: Left Arm, Patient Position: Sitting, Cuff Size: Small)   Pulse 70   Temp 97.9 F (36.6 C) (Oral)   Resp 16   Ht 5\' 8"  (1.727 m)   Wt 265 lb (120.2 kg)   SpO2 97%   BMI 40.29 kg/m       Objective:   Physical Exam  General Mental Status- Alert. General Appearance- Not in acute distress.   .  Chest and Lung Exam Auscultation: Breath Sounds:-Normal.  Cardiovascular Auscultation:Rythm- Regular. Murmurs & Other Heart Sounds:Auscultation of the heart reveals- No Murmurs.  Abdomen Inspection:-Inspeection Normal. Palpation/Percussion:Note:No mass. Palpation and Percussion of the abdomen reveal- Non Tender, Non Distended + BS, no rebound or guarding.   Neurologic Cranial Nerve exam:- CN III-XII intact(No nystagmus), symmetric smile. Strength:- 5/5 equal and symmetric strength both upper and lower extremities.     Assessment & Plan:  For your recent fatigue, I did place the below labs.  These include CBC, CMP, TSH, T4, B1, B12, iron, vitamin D and magnesium.  For recent mild frequent urination, will get urine culture and PSA.  You had work-up for cardiopulmonary stress test which appeared negative.  It appears that he could not complete the test fully and this might be related to deconditioning and obesity.  If he were to lose weight this might increase your energy.  There is a medication called Saxenda.  You could look into this medication and let me know if you want me to try to write this medication.  However it might not be covered under your insurance.  There are other options to help with weight loss as well.  Before prescribing this medication would like to review labs and assess thyroid function.  Follow-up in 2 weeks or as needed.  Regarding pt concern about using Repatha, I will plan to ask him to get opinion of cardiologist when he follows up  with specialist.  Mackie Pai, PA-C

## 2018-06-28 NOTE — Patient Instructions (Addendum)
For your recent fatigue, I did place the below labs.  These include CBC, CMP, TSH, T4, B1, B12, iron, vitamin D and magnesium.  For recent mild frequent urination, will get urine culture and PSA.  You had work-up for cardiopulmonary stress test which appeared negative.  It appears that Richard Walker could not complete the test fully and this might be related to deconditioning and obesity.  If Richard Walker were to lose weight this might increase your energy.  There is a medication called Saxenda.  You could look into this medication and let me know if you want me to try to write this medication.  However it might not be covered under your insurance.  There are other options to help with weight loss as well.  Before prescribing this medication would like to review labs and assess thyroid function.  Follow-up in 2 weeks or as needed.

## 2018-06-29 ENCOUNTER — Telehealth: Payer: Self-pay | Admitting: Medical

## 2018-06-29 ENCOUNTER — Telehealth: Payer: Self-pay | Admitting: Adult Health

## 2018-06-29 ENCOUNTER — Ambulatory Visit: Payer: Medicaid Other | Admitting: Adult Health

## 2018-06-29 ENCOUNTER — Encounter: Payer: Self-pay | Admitting: Adult Health

## 2018-06-29 VITALS — BP 109/74 | HR 67 | Ht 68.0 in | Wt 258.4 lb

## 2018-06-29 DIAGNOSIS — Z9989 Dependence on other enabling machines and devices: Secondary | ICD-10-CM | POA: Diagnosis not present

## 2018-06-29 DIAGNOSIS — G4733 Obstructive sleep apnea (adult) (pediatric): Secondary | ICD-10-CM

## 2018-06-29 LAB — CBC WITH DIFFERENTIAL/PLATELET
BASOS PCT: 1.2 % (ref 0.0–3.0)
Basophils Absolute: 0.1 10*3/uL (ref 0.0–0.1)
Eosinophils Absolute: 0.1 10*3/uL (ref 0.0–0.7)
Eosinophils Relative: 1.3 % (ref 0.0–5.0)
HCT: 47.2 % (ref 39.0–52.0)
Hemoglobin: 16 g/dL (ref 13.0–17.0)
Lymphocytes Relative: 22.9 % (ref 12.0–46.0)
Lymphs Abs: 1.7 10*3/uL (ref 0.7–4.0)
MCHC: 33.8 g/dL (ref 30.0–36.0)
MCV: 86.3 fl (ref 78.0–100.0)
MONO ABS: 0.7 10*3/uL (ref 0.1–1.0)
Monocytes Relative: 8.9 % (ref 3.0–12.0)
NEUTROS ABS: 4.8 10*3/uL (ref 1.4–7.7)
Neutrophils Relative %: 65.7 % (ref 43.0–77.0)
PLATELETS: 187 10*3/uL (ref 150.0–400.0)
RBC: 5.47 Mil/uL (ref 4.22–5.81)
RDW: 14.1 % (ref 11.5–15.5)
WBC: 7.3 10*3/uL (ref 4.0–10.5)

## 2018-06-29 LAB — COMPREHENSIVE METABOLIC PANEL
ALT: 19 U/L (ref 0–53)
AST: 14 U/L (ref 0–37)
Albumin: 4.2 g/dL (ref 3.5–5.2)
Alkaline Phosphatase: 84 U/L (ref 39–117)
BUN: 20 mg/dL (ref 6–23)
CALCIUM: 9.3 mg/dL (ref 8.4–10.5)
CHLORIDE: 101 meq/L (ref 96–112)
CO2: 29 meq/L (ref 19–32)
CREATININE: 1.03 mg/dL (ref 0.40–1.50)
GFR: 77.77 mL/min (ref >60.00)
Glucose, Bld: 110 mg/dL — ABNORMAL HIGH (ref 70–99)
Potassium: 4.3 mEq/L (ref 3.5–5.1)
SODIUM: 138 meq/L (ref 135–145)
Total Bilirubin: 0.5 mg/dL (ref 0.2–1.2)
Total Protein: 6.7 g/dL (ref 6.0–8.3)

## 2018-06-29 LAB — URINE CULTURE
MICRO NUMBER:: 91336757
Result:: NO GROWTH
SPECIMEN QUALITY: ADEQUATE

## 2018-06-29 LAB — IRON: IRON: 69 ug/dL (ref 42–165)

## 2018-06-29 LAB — PSA: PSA: 1.1 ng/mL (ref 0.10–4.00)

## 2018-06-29 LAB — MAGNESIUM: Magnesium: 2.6 mg/dL — ABNORMAL HIGH (ref 1.5–2.5)

## 2018-06-29 LAB — TSH: TSH: 1.23 u[IU]/mL (ref 0.35–4.50)

## 2018-06-29 LAB — T4, FREE: Free T4: 0.94 ng/dL (ref 0.60–1.60)

## 2018-06-29 LAB — VITAMIN B12: Vitamin B-12: 304 pg/mL (ref 211–911)

## 2018-06-29 LAB — VITAMIN D 25 HYDROXY (VIT D DEFICIENCY, FRACTURES): VITD: 21.4 ng/mL — AB (ref 30.00–100.00)

## 2018-06-29 MED ORDER — VITAMIN D (ERGOCALCIFEROL) 1.25 MG (50000 UNIT) PO CAPS: 50000.0000 [IU] | ORAL_CAPSULE | ORAL | 0 refills | Status: DC

## 2018-06-29 NOTE — Progress Notes (Signed)
PATIENT: Richard Walker DOB: 05-23-1956  REASON FOR VISIT: follow up HISTORY FROM: patient  HISTORY OF PRESENT ILLNESS: Today 06/29/18 [name] is a 62 year old male with a history of obstructive sleep apnea on CPAP.  He returns today for follow-up.  His download indicates that he uses machine 29 out of 30 days for compliance of 96%.  He uses machine other than 4 hours each night.  On average he uses his machine 8 hours and 9 minutes.  His residual AHI is 2.4.  He is currently on AutoSet with pressure 7 to 12 cm of water.  He does feel his mask leaking.  He has not changed out his supplies in over 6 months.  He returns today for evaluation.  His Epworth sleepiness score is 18, fatigue severity score is 63.    HISTORY Mr. [name] reports that he lost his father in December 22, multiple health complications and finally lost his job, his wife also had to undergo several surgeries.  2018 was not an easy year for him.  He had seen [name] for a nasal septoplasty surgery it does seem to have helped with nasal patency now but he had some hiccups on the way including epistaxis, and it took a long time to see the mucous lining finally swelled down.  He is now able to use CPAP with good compliance again, his compliance is 90% plus average use of time is 7 hours and 12 minutes, he has used  Auto CPAP with a mean pressure of 8.4 cmH2O , the average AHI residual is 1.9/hr. , no changes in settings have to be done.  Minimum pressure is 7, maximum pressure is 12 cmH2O with 3 cm EPR ramp starts at 4 cmH2O.  He has seen his cardiologist, [name], who has treated him for SVTs by Ablation.  His tachycardia subsided for about 4 months but then after the ablation did come back.  [name] suspect that there is another causative problem.  May be acid reflux, may be apnea -however his apnea should not be a risk factor at this successful resolution. Underwent  upper Gi/ gastroscopy with no abnormalities. He  now presents for SOB, coughing and palpitation- he likes to use his CPAP as it helps to prevent tachycardia and palpitations. Chest X ray was taken on 1-7 and he stated he has not been given results. There is an Epic note, small lung volume old scarring. described productive cough -?  Mucopurulent bronchitis ?[name] On doxicyline and mucinex.     REVIEW OF SYSTEMS: Out of a complete 14 system review of symptoms, the patient complains only of the following symptoms, and all other reviewed systems are negative.  See HPI  ALLERGIES: Allergies  Allergen Reactions  . Tree Extract   . Pork-Derived Products Swelling  . Statins     Muscle aches    HOME MEDICATIONS: Outpatient Medications Prior to Visit  Medication Sig Dispense Refill  . albuterol (PROAIR HFA) 108 (90 Base) MCG/ACT inhaler Inhale 1-2 puffs into the lungs every 6 (six) hours as needed for wheezing or shortness of breath. 1 Inhaler 0  . aspirin 81 MG tablet Take 81 mg by mouth daily.    [name] azelastine (ASTELIN) 0.1 % nasal spray 1-2 sprays per nostril 2 times daily as needed. 30 mL 5  . budesonide-formoterol (SYMBICORT) 160-4.5 MCG/ACT inhaler Inhale 2 puffs into the lungs 2 (two) times daily. 1 Inhaler 0  . ezetimibe (ZETIA) 10 MG tablet  Take 1 tablet (10 mg total) by mouth daily. 30 tablet 3  . flecainide (TAMBOCOR) 50 MG tablet TAKE 1 TABLET BY MOUTH AS NEEDED FOR PALPITATIONS DESPITE TAKING METOPROLOL. NOT TO EXCEED 6 TABLETS IN 24 HOURS 45 tablet 3  . fluticasone (FLONASE) 50 MCG/ACT nasal spray Place 1 spray into both nostrils daily.    [name] levocetirizine (XYZAL) 5 MG tablet TAKE 1 TABLET(5 MG) BY MOUTH EVERY EVENING 90 tablet 0  . metoprolol tartrate (LOPRESSOR) 25 MG tablet TAKE 1 TABLET(25 MG) BY MOUTH TWICE DAILY 180 tablet 2  . pantoprazole (PROTONIX) 40 MG tablet TAKE 1 TABLET(40 MG) BY MOUTH TWICE DAILY BEFORE A MEAL 60 tablet 0  . predniSONE (DELTASONE) 10 MG tablet Take 1 tablet by mouth daily.    [name] PRESCRIPTION MEDICATION  Inhale into the lungs at bedtime. CPAP    . famotidine-calcium carbonate-magnesium hydroxide (PEPCID COMPLETE) 10-800-165 MG chewable tablet Chew by mouth.    [name] HYDROcodone-acetaminophen (NORCO) 5-325 MG tablet Take 1 tablet by mouth every 6 (six) hours as needed for moderate pain. (Patient not taking: Reported on 06/28/2018) 6 tablet 0  . HYDROcodone-acetaminophen (NORCO/VICODIN) 5-325 MG tablet Take 1-2 tablets by mouth every 6 (six) hours as needed for moderate pain or severe pain. (Patient not taking: Reported on 06/28/2018) 20 tablet 0  . montelukast (SINGULAIR) 10 MG tablet Take 1 tablet once at night for coughing or wheezing. (Patient not taking: Reported on 06/29/2018) 30 tablet 1  . sildenafil (REVATIO) 20 MG tablet Take 1-2 tablets (20-40 mg total) by mouth as needed. (Patient not taking: Reported on 06/28/2018) 60 tablet 1  . budesonide-formoterol (SYMBICORT) 160-4.5 MCG/ACT inhaler Inhale 2 puffs into the lungs 2 (two) times daily. (Patient not taking: Reported on 06/28/2018) 1 Inhaler 6  . methylPREDNISolone (MEDROL DOSEPAK) 4 MG TBPK tablet 6 day dose pack - take as directed 21 tablet 0   No facility-administered medications prior to visit.     PAST MEDICAL HISTORY: Past Medical History:  Diagnosis Date  . DDD (degenerative disc disease), lumbar    L3/L4  . Diverticulosis   . GERD (gastroesophageal reflux disease)   . Hyperlipidemia   . Low sperm motility   . Sleep apnea    uses cpap   . SVT (supraventricular tachycardia) (HCC)     PAST SURGICAL HISTORY: Past Surgical History:  Procedure Laterality Date  . BACK SURGERY    . CARDIAC CATHETERIZATION N/A 12/22/2015   Procedure: Left Heart Cath and Coronary Angiography;  Surgeon: Peter M Swaziland, MD;  Location: [facility] INVASIVE CV LAB;  Service: Cardiovascular;  Laterality: N/A;  . CARDIAC ELECTROPHYSIOLOGY STUDY AND ABLATION    . ELECTROPHYSIOLOGIC STUDY N/A 02/19/2016   Procedure: SVT Ablation;  Surgeon: Marinus Maw, MD;  Location:  [facility] INVASIVE CV LAB;  Service: Cardiovascular;  Laterality: N/A;  . septal reconstruction  2017  . TURBINATE REDUCTION Bilateral 07/2016  . WISDOM TOOTH EXTRACTION      FAMILY HISTORY: Family History  Problem Relation Age of Onset  . Hypertension Father   . Food Allergy Son   . Colon cancer Neg Hx   . Stomach cancer Neg Hx   . Allergic rhinitis Neg Hx   . Angioedema Neg Hx   . Asthma Neg Hx   . Eczema Neg Hx   . Immunodeficiency Neg Hx   . Urticaria Neg Hx     SOCIAL HISTORY: Social History   Socioeconomic History  . Marital status: Married    Spouse name: Not  on file  . Number of children: Not on file  . Years of education: Not on file  . Highest education level: Not on file  Occupational History  . Not on file  Social Needs  . Financial resource strain: Not on file  . Food insecurity:    Worry: Not on file    Inability: Not on file  . Transportation needs:    Medical: Not on file    Non-medical: Not on file  Tobacco Use  . Smoking status: Former Smoker    Packs/day: 1.00    Years: 10.00    Pack years: 10.00    Types: Cigarettes    Last attempt to quit: 1992    Years since quitting: 27.8  . Smokeless tobacco: Never Used  . Tobacco comment: Uses Hookah Pipe occasionally  Substance and Sexual Activity  . Alcohol use: No  . Drug use: No  . Sexual activity: Yes  Lifestyle  . Physical activity:    Days per week: Not on file    Minutes per session: Not on file  . Stress: Not on file  Relationships  . Social connections:    Talks on phone: Not on file    Gets together: Not on file    Attends religious service: Not on file    Active member of club or organization: Not on file    Attends meetings of clubs or organizations: Not on file    Relationship status: Not on file  . Intimate partner violence:    Fear of current or ex partner: Not on file    Emotionally abused: Not on file    Physically abused: Not on file    Forced sexual activity: Not on file    Other Topics Concern  . Not on file  Social History Narrative   Marital Status: Married Engineer, drilling)   Children:  4 (3 Sons/1 Daughter)   Pets:  None    Living Situation: Lives with spouse and 4 children   Origin:  He was born in [place].   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     Alcohol Use:  None   Drug Use:  None   Diet:  Regular   Exercise:  None   Hobbies:  Cards      PHYSICAL EXAM  Vitals:   06/29/18 1055  BP: 109/74  Pulse: 67  Weight: 258 lb 6.4 oz (117.2 kg)  Height: 5\' 8"  (1.727 m)   Body mass index is 39.29 kg/m.  Generalized: Well developed, in no acute distress   Neurological examination  Mentation: Alert oriented to time, place, history taking. Follows all commands speech and language fluent Cranial nerve II-XII:  Extraocular movements were full, visual field were full on confrontational test. Facial sensation and strength were normal. Uvula tongue midline. Head turning and shoulder shrug  were normal and symmetric. Motor: The motor testing reveals 5 over 5 strength of all 4 extremities. Good symmetric motor tone is noted throughout.  Neck circumference 18.5 inches, Mallampati 4+ Sensory: Sensory testing is intact to soft touch on all 4 extremities. No evidence of extinction is noted.  Coordination: Cerebellar testing reveals good finger-nose-finger and heel-to-shin bilaterally.  Gait and station: Gait is normal.     DIAGNOSTIC DATA (LABS, IMAGING, TESTING) - I reviewed patient records, labs, notes, testing and imaging myself where available.  Lab Results  Component Value Date  WBC 7.2 03/28/2018   HGB 16.0 03/28/2018   HCT 46.9 03/28/2018   MCV 84.9 03/28/2018   PLT 182.0 03/28/2018      Component Value Date/Time   NA 136 03/28/2018 1539   K 4.0 03/28/2018 1539   CL 102 03/28/2018 1539   CO2 27 03/28/2018  1539   GLUCOSE 106 (H) 03/28/2018 1539   BUN 11 03/28/2018 1539   CREATININE 1.00 03/28/2018 1539   CREATININE 1.18 02/12/2016 1508   CALCIUM 9.7 03/28/2018 1539   PROT 7.5 03/28/2018 1539   ALBUMIN 4.3 03/28/2018 1539   AST 15 03/28/2018 1539   ALT 22 03/28/2018 1539   ALKPHOS 77 03/28/2018 1539   BILITOT 0.7 03/28/2018 1539   GFRNONAA >60 01/30/2016 0910   GFRNONAA >89 03/04/2014 1456   GFRAA >60 01/30/2016 0910   GFRAA >89 03/04/2014 1456   Lab Results  Component Value Date   CHOL 239 (H) 10/24/2017   HDL 41.90 10/24/2017   LDLCALC 162 (H) 03/04/2014   LDLDIRECT 145.0 10/24/2017   TRIG 227.0 (H) 10/24/2017   CHOLHDL 6 10/24/2017   Lab Results  Component Value Date   HGBA1C 5.6 10/24/2017   Lab Results  Component Value Date   VITAMINB12 299 10/24/2017   Lab Results  Component Value Date   TSH 2.31 10/24/2017      ASSESSMENT AND PLAN 62 y.o. year old male  has a past medical history of DDD (degenerative disc disease), lumbar, Diverticulosis, GERD (gastroesophageal reflux disease), Hyperlipidemia, Low sperm motility, Sleep apnea, and SVT (supraventricular tachycardia) (HCC). here with :  1.  Obstructive sleep apnea on CPAP  The patient CPAP download shows excellent compliance and good treatment of his apnea.  He is encouraged to continue using the CPAP nightly and greater than 4 hours each night.  An order for new supplies has been sent to his DME company.  He is advised that if his symptoms worsen or he develops new symptoms he should let us know.  He will follow-up in 1 year or sooner if needed.   I spent 15 minutes with the patient. 50% of this time was spent reviewing his CPAP download   Butch Penny, MSN, NP-C 06/29/2018, 11:12 AM [facility] [address] [place], [place] [address] [phone]

## 2018-06-29 NOTE — Patient Instructions (Addendum)
Your Plan:  Continue using CPAP nightly and >4 hours each night If your symptoms worsen or you develop new symptoms please let us know.   Please call [employer] at [phone], and press option 1 when prompted. Their customer service representatives will be glad to assist you. If they are unable to answer, please leave a message and they will call you back. Make sure to leave your name and return phone number   Thank you for coming to see us at [facility]. I hope we have been able to provide you high quality care today.  You may receive a patient satisfaction survey over the next few weeks. We would appreciate your feedback and comments so that we may continue to improve ourselves and the health of our patients.  

## 2018-06-29 NOTE — Progress Notes (Signed)
Community message sent via Epic to [employer], re: new supplies.

## 2018-06-29 NOTE — Telephone Encounter (Signed)
Rx ergocalciferol sent to pt pharmcay.

## 2018-06-30 ENCOUNTER — Other Ambulatory Visit (INDEPENDENT_AMBULATORY_CARE_PROVIDER_SITE_OTHER): Payer: Medicaid Other

## 2018-06-30 ENCOUNTER — Telehealth: Payer: Self-pay | Admitting: Medical

## 2018-06-30 DIAGNOSIS — R739 Hyperglycemia, unspecified: Secondary | ICD-10-CM

## 2018-06-30 LAB — HEMOGLOBIN A1C: HEMOGLOBIN A1C: 5.8 % (ref 4.6–6.5)

## 2018-06-30 MED ORDER — METFORMIN HCL 500 MG PO TABS: 500.0000 mg | ORAL_TABLET | Freq: Two times a day (BID) | ORAL | 3 refills | Status: DC

## 2018-06-30 NOTE — Telephone Encounter (Signed)
error 

## 2018-06-30 NOTE — Telephone Encounter (Signed)
RX meformin sent to pt pharmacy.

## 2018-07-01 LAB — VITAMIN B1: Vitamin B1 (Thiamine): 27 nmol/L (ref 8–30)

## 2018-07-15 ENCOUNTER — Ambulatory Visit (HOSPITAL_COMMUNITY): Payer: Medicaid Other | Admitting: Anesthesiology

## 2018-07-15 ENCOUNTER — Encounter (HOSPITAL_BASED_OUTPATIENT_CLINIC_OR_DEPARTMENT_OTHER): Payer: Self-pay | Admitting: *Deleted

## 2018-07-15 ENCOUNTER — Ambulatory Visit (HOSPITAL_COMMUNITY)
Admission: EM | Admit: 2018-07-15 | Discharge: 2018-07-15 | Disposition: A | Discharge status: Home / Self Care | Payer: Medicaid Other | Source: Ambulatory Visit | Attending: Orthopedic Surgery | Admitting: Orthopedic Surgery

## 2018-07-15 ENCOUNTER — Ambulatory Visit: Admit: 2018-07-15 | Payer: Medicaid Other | Admitting: Orthopedic Surgery

## 2018-07-15 ENCOUNTER — Ambulatory Visit (HOSPITAL_COMMUNITY): Payer: Medicaid Other

## 2018-07-15 ENCOUNTER — Emergency Department (HOSPITAL_BASED_OUTPATIENT_CLINIC_OR_DEPARTMENT_OTHER): Payer: Medicaid Other

## 2018-07-15 ENCOUNTER — Emergency Department (HOSPITAL_BASED_OUTPATIENT_CLINIC_OR_DEPARTMENT_OTHER)
Admission: EM | Admit: 2018-07-15 | Discharge: 2018-07-15 | Disposition: A | Discharge status: Other Acute Inpatient Hospital | Payer: Medicaid Other | Source: Home / Self Care | Attending: Emergency Medicine | Admitting: Emergency Medicine

## 2018-07-15 ENCOUNTER — Emergency Department (HOSPITAL_COMMUNITY)
Admission: EM | Admit: 2018-07-15 | Discharge: 2018-07-15 | Discharge status: Absent without leave | Payer: Medicaid Other

## 2018-07-15 ENCOUNTER — Encounter (HOSPITAL_COMMUNITY): Payer: Self-pay | Admitting: *Deleted

## 2018-07-15 ENCOUNTER — Other Ambulatory Visit: Payer: Self-pay

## 2018-07-15 ENCOUNTER — Encounter (HOSPITAL_BASED_OUTPATIENT_CLINIC_OR_DEPARTMENT_OTHER)
Admission: EM | Disposition: A | Discharge status: Other Acute Inpatient Hospital | Payer: Self-pay | Source: Home / Self Care | Attending: Emergency Medicine

## 2018-07-15 ENCOUNTER — Encounter (HOSPITAL_COMMUNITY)
Admission: EM | Disposition: A | Discharge status: Home / Self Care | Payer: Self-pay | Source: Ambulatory Visit | Attending: Orthopedic Surgery

## 2018-07-15 DIAGNOSIS — S62623B Displaced fracture of medial phalanx of left middle finger, initial encounter for open fracture: Secondary | ICD-10-CM | POA: Diagnosis not present

## 2018-07-15 DIAGNOSIS — Z87891 Personal history of nicotine dependence: Secondary | ICD-10-CM | POA: Insufficient documentation

## 2018-07-15 DIAGNOSIS — Y998 Other external cause status: Secondary | ICD-10-CM | POA: Insufficient documentation

## 2018-07-15 DIAGNOSIS — Z7951 Long term (current) use of inhaled steroids: Secondary | ICD-10-CM | POA: Diagnosis not present

## 2018-07-15 DIAGNOSIS — E785 Hyperlipidemia, unspecified: Secondary | ICD-10-CM | POA: Diagnosis not present

## 2018-07-15 DIAGNOSIS — W270XXA Contact with workbench tool, initial encounter: Secondary | ICD-10-CM

## 2018-07-15 DIAGNOSIS — E669 Obesity, unspecified: Secondary | ICD-10-CM | POA: Diagnosis not present

## 2018-07-15 DIAGNOSIS — S66323A Laceration of extensor muscle, fascia and tendon of left middle finger at wrist and hand level, initial encounter: Secondary | ICD-10-CM | POA: Diagnosis not present

## 2018-07-15 DIAGNOSIS — K219 Gastro-esophageal reflux disease without esophagitis: Secondary | ICD-10-CM | POA: Diagnosis not present

## 2018-07-15 DIAGNOSIS — Z7982 Long term (current) use of aspirin: Secondary | ICD-10-CM | POA: Insufficient documentation

## 2018-07-15 DIAGNOSIS — S61215A Laceration without foreign body of left ring finger without damage to nail, initial encounter: Secondary | ICD-10-CM | POA: Insufficient documentation

## 2018-07-15 DIAGNOSIS — S62633B Displaced fracture of distal phalanx of left middle finger, initial encounter for open fracture: Secondary | ICD-10-CM | POA: Insufficient documentation

## 2018-07-15 DIAGNOSIS — Z6837 Body mass index (BMI) 37.0-37.9, adult: Secondary | ICD-10-CM | POA: Insufficient documentation

## 2018-07-15 DIAGNOSIS — Z79899 Other long term (current) drug therapy: Secondary | ICD-10-CM | POA: Insufficient documentation

## 2018-07-15 DIAGNOSIS — Y9389 Activity, other specified: Secondary | ICD-10-CM | POA: Insufficient documentation

## 2018-07-15 DIAGNOSIS — E119 Type 2 diabetes mellitus without complications: Secondary | ICD-10-CM | POA: Insufficient documentation

## 2018-07-15 DIAGNOSIS — S62621B Displaced fracture of medial phalanx of left index finger, initial encounter for open fracture: Secondary | ICD-10-CM | POA: Diagnosis not present

## 2018-07-15 DIAGNOSIS — S62631B Displaced fracture of distal phalanx of left index finger, initial encounter for open fracture: Secondary | ICD-10-CM

## 2018-07-15 DIAGNOSIS — S6990XA Unspecified injury of unspecified wrist, hand and finger(s), initial encounter: Secondary | ICD-10-CM

## 2018-07-15 DIAGNOSIS — Z9989 Dependence on other enabling machines and devices: Secondary | ICD-10-CM | POA: Diagnosis not present

## 2018-07-15 DIAGNOSIS — Z7984 Long term (current) use of oral hypoglycemic drugs: Secondary | ICD-10-CM

## 2018-07-15 DIAGNOSIS — G4730 Sleep apnea, unspecified: Secondary | ICD-10-CM | POA: Insufficient documentation

## 2018-07-15 DIAGNOSIS — Y929 Unspecified place or not applicable: Secondary | ICD-10-CM | POA: Insufficient documentation

## 2018-07-15 DIAGNOSIS — I10 Essential (primary) hypertension: Secondary | ICD-10-CM | POA: Insufficient documentation

## 2018-07-15 HISTORY — PX: AMPUTATION: SHX166

## 2018-07-15 HISTORY — DX: Prediabetes: R73.03

## 2018-07-15 LAB — BASIC METABOLIC PANEL
Anion gap: 11 (ref 5–15)
BUN: 15 mg/dL (ref 8–23)
CALCIUM: 9.2 mg/dL (ref 8.9–10.3)
CHLORIDE: 103 mmol/L (ref 98–111)
CO2: 20 mmol/L — ABNORMAL LOW (ref 22–32)
CREATININE: 1.04 mg/dL (ref 0.61–1.24)
GFR calc non Af Amer: >60 mL/min (ref >60)
Glucose, Bld: 122 mg/dL — ABNORMAL HIGH (ref 70–99)
Potassium: 3.5 mmol/L (ref 3.5–5.1)
Sodium: 134 mmol/L — ABNORMAL LOW (ref 135–145)

## 2018-07-15 LAB — CBC
HCT: 50.8 % (ref 39.0–52.0)
HEMOGLOBIN: 16.3 g/dL (ref 13.0–17.0)
MCH: 28 pg (ref 26.0–34.0)
MCHC: 32.1 g/dL (ref 30.0–36.0)
MCV: 87.1 fL (ref 80.0–100.0)
Platelets: 216 10*3/uL (ref 150–400)
RBC: 5.83 MIL/uL — ABNORMAL HIGH (ref 4.22–5.81)
RDW: 13.2 % (ref 11.5–15.5)
WBC: 7.7 10*3/uL (ref 4.0–10.5)
nRBC: 0 % (ref 0.0–0.2)

## 2018-07-15 LAB — GLUCOSE, CAPILLARY: GLUCOSE-CAPILLARY: 102 mg/dL — AB (ref 70–99)

## 2018-07-15 SURGERY — AMPUTATION DIGIT
Anesthesia: General | Laterality: Left

## 2018-07-15 SURGERY — AMPUTATION DIGIT
Anesthesia: General | Site: Finger | Laterality: Left

## 2018-07-15 MED ORDER — CEFAZOLIN SODIUM-DEXTROSE 1-4 GM/50ML-% IV SOLN
INTRAVENOUS | Status: AC
  Administered 2018-07-15: 1000 mg
  Filled 2018-07-15: qty 50

## 2018-07-15 MED ORDER — HYDROMORPHONE HCL 1 MG/ML IJ SOLN
1.0000 mg | Freq: Once | INTRAMUSCULAR | Status: AC
  Administered 2018-07-15: 1 mg via INTRAVENOUS
  Filled 2018-07-15: qty 1

## 2018-07-15 MED ORDER — LACTATED RINGERS IV SOLN
INTRAVENOUS | Status: DC
  Administered 2018-07-15: 16:00:00 via INTRAVENOUS

## 2018-07-15 MED ORDER — CEFAZOLIN SODIUM-DEXTROSE 2-4 GM/100ML-% IV SOLN
2.0000 g | Freq: Once | INTRAVENOUS | Status: DC
  Filled 2018-07-15: qty 100

## 2018-07-15 MED ORDER — IBUPROFEN 200 MG PO TABS: 600.0000 mg | ORAL_TABLET | Freq: Four times a day (QID) | ORAL | Status: DC

## 2018-07-15 MED ORDER — ONDANSETRON HCL 4 MG/2ML IJ SOLN
INTRAMUSCULAR | Status: AC
  Filled 2018-07-15: qty 2

## 2018-07-15 MED ORDER — MIDAZOLAM HCL 2 MG/2ML IJ SOLN
INTRAMUSCULAR | Status: AC
  Filled 2018-07-15: qty 2

## 2018-07-15 MED ORDER — FENTANYL CITRATE (PF) 250 MCG/5ML IJ SOLN
INTRAMUSCULAR | Status: AC
  Filled 2018-07-15: qty 5

## 2018-07-15 MED ORDER — BUPIVACAINE-EPINEPHRINE (PF) 0.5% -1:200000 IJ SOLN
INTRAMUSCULAR | Status: AC
  Filled 2018-07-15: qty 60

## 2018-07-15 MED ORDER — OXYCODONE HCL 5 MG PO TABS: 0.0000 mg | ORAL_TABLET | Freq: Four times a day (QID) | ORAL | 0 refills | Status: DC | PRN

## 2018-07-15 MED ORDER — LIDOCAINE HCL 1 % IJ SOLN
INTRAMUSCULAR | Status: DC | PRN
  Administered 2018-07-15: 15 mL

## 2018-07-15 MED ORDER — CEPHALEXIN 500 MG PO CAPS: 500.0000 mg | ORAL_CAPSULE | Freq: Four times a day (QID) | ORAL | 0 refills | Status: AC

## 2018-07-15 MED ORDER — BUPIVACAINE HCL (PF) 0.25 % IJ SOLN
INTRAMUSCULAR | Status: AC
  Filled 2018-07-15: qty 30

## 2018-07-15 MED ORDER — FENTANYL CITRATE (PF) 100 MCG/2ML IJ SOLN
INTRAMUSCULAR | Status: DC | PRN
  Administered 2018-07-15 (×2): 50 ug via INTRAVENOUS

## 2018-07-15 MED ORDER — PROPOFOL 10 MG/ML IV BOLUS
INTRAVENOUS | Status: DC | PRN
  Administered 2018-07-15: 40 mg via INTRAVENOUS
  Administered 2018-07-15: 30 mg via INTRAVENOUS

## 2018-07-15 MED ORDER — OXYCODONE HCL 5 MG PO TABS
5.0000 mg | ORAL_TABLET | ORAL | Status: AC | PRN
  Administered 2018-07-15: 5 mg via ORAL

## 2018-07-15 MED ORDER — SODIUM CHLORIDE 0.9 % IR SOLN
Status: DC | PRN
  Administered 2018-07-15: 3000 mL

## 2018-07-15 MED ORDER — MIDAZOLAM HCL 5 MG/5ML IJ SOLN
INTRAMUSCULAR | Status: DC | PRN
  Administered 2018-07-15: 2 mg via INTRAVENOUS

## 2018-07-15 MED ORDER — 0.9 % SODIUM CHLORIDE (POUR BTL) OPTIME
TOPICAL | Status: DC | PRN
  Administered 2018-07-15: 1000 mL

## 2018-07-15 MED ORDER — CEFAZOLIN SODIUM-DEXTROSE 2-4 GM/100ML-% IV SOLN
2.0000 g | Freq: Once | INTRAVENOUS | Status: AC
  Administered 2018-07-15: 2 g via INTRAVENOUS
  Filled 2018-07-15: qty 100

## 2018-07-15 MED ORDER — TETANUS-DIPHTH-ACELL PERTUSSIS 5-2.5-18.5 LF-MCG/0.5 IM SUSP
0.5000 mL | Freq: Once | INTRAMUSCULAR | Status: DC
  Filled 2018-07-15: qty 0.5

## 2018-07-15 MED ORDER — OXYCODONE HCL 5 MG PO TABS
ORAL_TABLET | ORAL | Status: AC
  Filled 2018-07-15: qty 1

## 2018-07-15 MED ORDER — PROPOFOL 10 MG/ML IV BOLUS
INTRAVENOUS | Status: AC
  Filled 2018-07-15: qty 20

## 2018-07-15 MED ORDER — LIDOCAINE HCL (PF) 1 % IJ SOLN
INTRAMUSCULAR | Status: AC
  Filled 2018-07-15: qty 30

## 2018-07-15 MED ORDER — BUPIVACAINE HCL (PF) 0.5 % IJ SOLN
INTRAMUSCULAR | Status: DC | PRN
  Administered 2018-07-15: 15 mL

## 2018-07-15 MED ORDER — SODIUM CHLORIDE 0.9 % IV SOLN
INTRAVENOUS | Status: DC | PRN
  Administered 2018-07-15: 1000 mL via INTRAVENOUS

## 2018-07-15 MED ORDER — ACETAMINOPHEN 325 MG PO TABS: 650.0000 mg | ORAL_TABLET | Freq: Four times a day (QID) | ORAL | Status: DC

## 2018-07-15 MED ORDER — BUPIVACAINE HCL (PF) 0.5 % IJ SOLN
INTRAMUSCULAR | Status: AC
  Filled 2018-07-15: qty 30

## 2018-07-15 MED ORDER — PHENYLEPHRINE 40 MCG/ML (10ML) SYRINGE FOR IV PUSH (FOR BLOOD PRESSURE SUPPORT)
PREFILLED_SYRINGE | INTRAVENOUS | Status: AC
  Filled 2018-07-15: qty 10

## 2018-07-15 MED ORDER — PROPOFOL 500 MG/50ML IV EMUL
INTRAVENOUS | Status: DC | PRN
  Administered 2018-07-15: 50 ug/kg/min via INTRAVENOUS

## 2018-07-15 MED ORDER — SODIUM CHLORIDE 0.9 % IV BOLUS
1000.0000 mL | Freq: Once | INTRAVENOUS | Status: AC
  Administered 2018-07-15: 1000 mL via INTRAVENOUS

## 2018-07-15 SURGICAL SUPPLY — 45 items
ANCHOR JUGGERKNOT 1.0 3-0 NLD (Anchor) ×3 IMPLANT
BANDAGE COBAN STERILE 2 (GAUZE/BANDAGES/DRESSINGS) IMPLANT
BLADE AVERAGE 25MMX9MM (BLADE)
BLADE AVERAGE 25X9 (BLADE) IMPLANT
BNDG COHESIVE 4X5 TAN NS LF (GAUZE/BANDAGES/DRESSINGS) IMPLANT
BNDG COHESIVE 4X5 TAN STRL (GAUZE/BANDAGES/DRESSINGS) ×3 IMPLANT
BNDG CONFORM 2 STRL LF (GAUZE/BANDAGES/DRESSINGS) IMPLANT
BNDG CONFORM 3 STRL LF (GAUZE/BANDAGES/DRESSINGS) IMPLANT
BNDG ELASTIC 2X5.8 VLCR STR LF (GAUZE/BANDAGES/DRESSINGS) IMPLANT
BNDG ESMARK 4X9 LF (GAUZE/BANDAGES/DRESSINGS) ×3 IMPLANT
BNDG GAUZE ELAST 4 BULKY (GAUZE/BANDAGES/DRESSINGS) ×3 IMPLANT
CHLORAPREP W/TINT 26ML (MISCELLANEOUS) IMPLANT
CORDS BIPOLAR (ELECTRODE) ×3 IMPLANT
COVER WAND RF STERILE (DRAPES) ×3 IMPLANT
DRAPE SURG 17X23 STRL (DRAPES) ×3 IMPLANT
DRSG ADAPTIC 3X8 NADH LF (GAUZE/BANDAGES/DRESSINGS) ×3 IMPLANT
DRSG EMULSION OIL 3X3 NADH (GAUZE/BANDAGES/DRESSINGS) IMPLANT
ELECT REM PT RETURN 9FT ADLT (ELECTROSURGICAL) ×3
ELECTRODE REM PT RTRN 9FT ADLT (ELECTROSURGICAL) ×1 IMPLANT
GAUZE SPONGE 4X4 12PLY STRL (GAUZE/BANDAGES/DRESSINGS) ×3 IMPLANT
GAUZE SPONGE 4X4 12PLY STRL LF (GAUZE/BANDAGES/DRESSINGS) ×3 IMPLANT
GAUZE XEROFORM 1X8 LF (GAUZE/BANDAGES/DRESSINGS) IMPLANT
GLOVE BIO SURGEON STRL SZ7.5 (GLOVE) ×3 IMPLANT
GLOVE BIOGEL PI IND STRL 8 (GLOVE) ×1 IMPLANT
GLOVE BIOGEL PI INDICATOR 8 (GLOVE) ×2
GOWN STRL REUS W/ TWL XL LVL3 (GOWN DISPOSABLE) ×1 IMPLANT
GOWN STRL REUS W/TWL XL LVL3 (GOWN DISPOSABLE) ×2
K-WIRE DBL TROCAR .045X4 ×12 IMPLANT
KIT BASIN OR (CUSTOM PROCEDURE TRAY) ×3 IMPLANT
KWIRE DBL TROCAR .045X4 ×4 IMPLANT
NEEDLE HYPO 25X1 1.5 SAFETY (NEEDLE) IMPLANT
PACK ORTHO EXTREMITY (CUSTOM PROCEDURE TRAY) ×3 IMPLANT
PAD CAST 4YDX4 CTTN HI CHSV (CAST SUPPLIES) ×1 IMPLANT
PADDING CAST ABS 4INX4YD NS (CAST SUPPLIES)
PADDING CAST ABS COTTON 4X4 ST (CAST SUPPLIES) IMPLANT
PADDING CAST COTTON 4X4 STRL (CAST SUPPLIES) ×3
PENCIL BUTTON HOLSTER BLD 10FT (ELECTRODE) IMPLANT
SPLINT PLASTER EXTRA FAST 3X15 (CAST SUPPLIES) ×2
SPLINT PLASTER GYPS XFAST 3X15 (CAST SUPPLIES) ×1 IMPLANT
SUT ETHILON 4 0 PS 2 18 (SUTURE) IMPLANT
SUT MNCRL AB 4-0 PS2 18 (SUTURE) IMPLANT
SUT VICRYL RAPIDE 4/0 PS 2 (SUTURE) ×6 IMPLANT
SYR 10ML LL (SYRINGE) IMPLANT
TOWEL OR 17X24 6PK STRL BLUE (TOWEL DISPOSABLE) ×3 IMPLANT
UNDERPAD 30X30 (UNDERPADS AND DIAPERS) ×3 IMPLANT

## 2018-07-15 NOTE — ED Notes (Signed)
Pt. Instructed to report directly to [facility], not to stop and to remain NPO. Pt instructed not to tamper with IV. IV secured for transport.

## 2018-07-15 NOTE — Consult Note (Deleted)
  The note originally documented on this encounter has been moved the the encounter in which it belongs.  

## 2018-07-15 NOTE — Anesthesia Procedure Notes (Signed)
Procedure Name: MAC Date/Time: 07/15/2018 4:55 PM Performed by: Lance Coon, CRNA Pre-anesthesia Checklist: Patient identified, Emergency Drugs available, Suction available, Patient being monitored and Timeout performed Patient Re-evaluated:Patient Re-evaluated prior to induction Oxygen Delivery Method: Simple face mask
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
## 2018-07-15 NOTE — Discharge Instructions (Signed)
[facility]  Dr Janee Mornhompson is your Hand Surgeon  [facility]  Dr Janee Mornhompson will meet you in either SHORT STAY or the PRE OPERATIVE HOLDING ROOM  GO DIRECTLY TO [place] NOW.    DO NOT EAT OR DRINK

## 2018-07-15 NOTE — Discharge Instructions (Addendum)
Discharge Instructions   You have a dressing with a plaster splint incorporated in it. Move your fingers as much as possible, making a full fist and fully opening the fist. Leave the dressing in place until you return to our office.  You may shower, but keep the bandage clean & dry.  You may drive a car when you are off of prescription pain medications and can safely control your vehicle with both hands. Our office will call you to arrange follow-up   Please call [phone] during normal business hours or [phone] after hours for any problems. Including the following:  - excessive redness of the incisions - drainage for more than 4 days - fever of more than 101.5 F  *Please note that pain medications will not be refilled after hours or on weekends.

## 2018-07-15 NOTE — Transfer of Care (Signed)
Immediate Anesthesia Transfer of Care Note  Patient: Richard Walker  Procedure(s) Performed: . REPAIR OF LEFT INDEX AND LONG FINGER BY FUSION /  REPAIR OF RING FINGER (Left Finger)  Patient Location: PACU  Anesthesia Type:MAC  Level of Consciousness: drowsy and patient cooperative  Airway & Oxygen Therapy: Patient Spontanous Breathing and Patient connected to face mask oxygen  Post-op Assessment: Report given to RN and Post -op Vital signs reviewed and stable  Post vital signs: Reviewed and stable  Last Vitals:  Vitals Value Taken Time  BP    Temp    Pulse    Resp    SpO2      Last Pain:  Vitals:   07/15/18 1612  TempSrc: Oral  PainSc: 8       Patients Stated Pain Goal: 4 (03/55/97 4163)  Complications: No apparent anesthesia complications

## 2018-07-15 NOTE — Anesthesia Preprocedure Evaluation (Addendum)
Anesthesia Evaluation  Patient identified by MRN, date of birth, ID band Patient awake    Reviewed: Allergy & Precautions, NPO status , Patient's Chart, lab work & pertinent test results, reviewed documented beta blocker date and time   Airway Mallampati: II  TM Distance: >3 FB Neck ROM: Full    Dental  (+) Teeth Intact, Dental Advisory Given   Pulmonary sleep apnea and Continuous Positive Airway Pressure Ventilation , former smoker,    Pulmonary exam normal breath sounds clear to auscultation       Cardiovascular hypertension, Pt. on home beta blockers Normal cardiovascular exam+ dysrhythmias Supra Ventricular Tachycardia  Rhythm:Regular Rate:Normal     Neuro/Psych negative neurological ROS     GI/Hepatic Neg liver ROS, GERD  Medicated,  Endo/Other  diabetes, Type 2, Oral Hypoglycemic AgentsObesity   Renal/GU negative Renal ROS     Musculoskeletal  (+) Arthritis ,   Abdominal   Peds  Hematology negative hematology ROS (+)   Anesthesia Other Findings Day of surgery medications reviewed with the patient.  Reproductive/Obstetrics                            Anesthesia Physical Anesthesia Plan  ASA: III  Anesthesia Plan: MAC   Post-op Pain Management:    Induction: Intravenous  PONV Risk Score and Plan: 1 and Propofol infusion and Treatment may vary due to age or medical condition  Airway Management Planned: Natural Airway and Nasal Cannula  Additional Equipment:   Intra-op Plan:   Post-operative Plan:   Informed Consent: I have reviewed the patients History and Physical, chart, labs and discussed the procedure including the risks, benefits and alternatives for the proposed anesthesia with the patient or authorized representative who has indicated his/her understanding and acceptance.   Dental advisory given  Plan Discussed with: CRNA  Anesthesia Plan Comments:        Anesthesia Quick Evaluation
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
## 2018-07-15 NOTE — Anesthesia Postprocedure Evaluation (Signed)
Anesthesia Post Note  Patient: Richard Walker  Procedure(s) Performed: . REPAIR OF LEFT INDEX AND LONG FINGER BY FUSION /  REPAIR OF RING FINGER (Left Finger)     Patient location during evaluation: PACU Anesthesia Type: General Level of consciousness: awake and alert, awake and oriented Pain management: pain level controlled Vital Signs Assessment: post-procedure vital signs reviewed and stable Respiratory status: spontaneous breathing, nonlabored ventilation and respiratory function stable Cardiovascular status: stable and blood pressure returned to baseline Postop Assessment: no apparent nausea or vomiting Anesthetic complications: no    Last Vitals:  Vitals:   07/15/18 1909 07/15/18 1913  BP: 117/72 114/73  Pulse: (!) 56 (!) 56  Resp: 13 14  Temp:    SpO2: 98% 97%    Last Pain:  Vitals:   07/15/18 1830  TempSrc:   PainSc: Asleep                 Cecile HearingStephen Edward Turk

## 2018-07-15 NOTE — ED Provider Notes (Signed)
[facility] EMERGENCY DEPARTMENT Provider Note   CSN: [id number] Arrival date & time: 07/15/18  1313     History   Chief Complaint Chief Complaint  Patient presents with  . Hand Injury    HPI Richard Walker is a 62 y.o. male.  HPI Patient is a 62 year old male who presents to the emergency department with complaints of acute hand injury to his left index middle and ring finger on his left hand.  He is a right-handed individual.  He cut his fingers on a table saw.  He presents immediately after the accident to the emergency department.  His tetanus was updated 1 year ago.  He reports 10 out of 10 pain in his left fingers.  He presents with an obvious deformity and significant lacerations of the left middle finger.  Superficial lacerations along the volar aspect of the left index and left ring finger overlying the distal phalanx   Past Medical History:  Diagnosis Date  . DDD (degenerative disc disease), lumbar    L3/L4  . Diverticulosis   . GERD (gastroesophageal reflux disease)   . Hyperlipidemia   . Low sperm motility   . Sleep apnea    uses cpap   . SVT (supraventricular tachycardia) [facility])     Patient Active Problem List   Diagnosis Date Noted  . Pain in right foot 03/30/2018  . Perennial and seasonal allergic rhinitis 11/09/2017  . History of food allergy 11/09/2017  . Wheezing 11/09/2017  . Dyspnea/wheezing 10/31/2017  . Cough 10/31/2017  . Bronchitis, mucopurulent recurrent (HCC) 09/07/2017  . SVT (supraventricular tachycardia) (HCC) 04/23/2015  . Abdominal pain, epigastric 06/03/2013  . Unspecified vitamin D deficiency 04/09/2013  . Esophageal reflux 04/09/2013  . Hepatitis B carrier (HCC) 04/09/2013  . Other and unspecified hyperlipidemia 04/09/2013  . Unspecified sleep apnea 04/09/2013    Past Surgical History:  Procedure Laterality Date  . BACK SURGERY    . CARDIAC CATHETERIZATION N/A 12/22/2015   Procedure: Left Heart Cath and Coronary  Angiography;  Surgeon: Peter M Swaziland, MD;  Location: [facility] INVASIVE CV LAB;  Service: Cardiovascular;  Laterality: N/A;  . CARDIAC ELECTROPHYSIOLOGY STUDY AND ABLATION    . ELECTROPHYSIOLOGIC STUDY N/A 02/19/2016   Procedure: SVT Ablation;  Surgeon: Marinus Maw, MD;  Location: [facility] INVASIVE CV LAB;  Service: Cardiovascular;  Laterality: N/A;  . septal reconstruction  2017  . TURBINATE REDUCTION Bilateral 07/2016  . WISDOM TOOTH EXTRACTION          Home Medications    Prior to Admission medications   Medication Sig Start Date End Date Taking? Authorizing Provider  albuterol (PROAIR HFA) 108 (90 Base) MCG/ACT inhaler Inhale 1-2 puffs into the lungs every 6 (six) hours as needed for wheezing or shortness of breath. 03/28/18   Olive Bass, FNP  aspirin 81 MG tablet Take 81 mg by mouth daily.    [provider]  azelastine (ASTELIN) 0.1 % nasal spray 1-2 sprays per nostril 2 times daily as needed. 11/09/17   Bobbitt, Heywood Iles, MD  budesonide-formoterol (SYMBICORT) 160-4.5 MCG/ACT inhaler Inhale 2 puffs into the lungs 2 (two) times daily. 04/27/18   Coralyn Helling, MD  ezetimibe (ZETIA) 10 MG tablet Take 1 tablet (10 mg total) by mouth daily. 10/26/17   Saguier, Ramon Dredge, PA-C  famotidine-calcium carbonate-magnesium hydroxide (PEPCID COMPLETE) 10-800-165 MG chewable tablet Chew by mouth.    [provider]  flecainide (TAMBOCOR) 50 MG tablet TAKE 1 TABLET BY MOUTH AS NEEDED FOR  PALPITATIONS DESPITE TAKING METOPROLOL. NOT TO EXCEED 6 TABLETS IN 24 HOURS 06/08/18   Marinus Maw, MD  fluticasone [facility]) 50 MCG/ACT nasal spray Place 1 spray into both nostrils daily.    [provider]  HYDROcodone-acetaminophen (NORCO) 5-325 MG tablet Take 1 tablet by mouth every 6 (six) hours as needed for moderate pain. Patient not taking: Reported on 06/28/2018 02/20/18   Saguier, Ramon Dredge, PA-C  HYDROcodone-acetaminophen (NORCO/VICODIN) 5-325 MG tablet Take 1-2 tablets by mouth every  6 (six) hours as needed for moderate pain or severe pain. Patient not taking: Reported on 06/28/2018 05/16/18   Arby Barrette, MD  levocetirizine (XYZAL) 5 MG tablet TAKE 1 TABLET(5 MG) BY MOUTH EVERY EVENING 12/30/17   Saguier, Ramon Dredge, PA-C  metFORMIN (GLUCOPHAGE) 500 MG tablet Take 1 tablet (500 mg total) by mouth 2 (two) times daily with a meal. 06/30/18   Saguier, Ramon Dredge, PA-C  metoprolol tartrate (LOPRESSOR) 25 MG tablet TAKE 1 TABLET(25 MG) BY MOUTH TWICE DAILY 06/07/18   Marinus Maw, MD  montelukast (SINGULAIR) 10 MG tablet Take 1 tablet once at night for coughing or wheezing. Patient not taking: Reported on 06/29/2018 03/28/18   Olive Bass, FNP  pantoprazole (PROTONIX) 40 MG tablet TAKE 1 TABLET(40 MG) BY MOUTH TWICE DAILY BEFORE A MEAL 11/21/17   Sherrilyn Rist, MD  predniSONE (DELTASONE) 10 MG tablet Take 1 tablet by mouth daily.    [provider]  PRESCRIPTION MEDICATION Inhale into the lungs at bedtime. CPAP    [provider]  sildenafil (REVATIO) 20 MG tablet Take 1-2 tablets (20-40 mg total) by mouth as needed. Patient not taking: Reported on 06/28/2018 07/27/17   Marinus Maw, MD  Vitamin D, Ergocalciferol, (DRISDOL) 1.25 MG (50000 UT) CAPS capsule Take 1 capsule (50,000 Units total) by mouth every 7 (seven) days. 06/29/18   Saguier, Ramon Dredge, PA-C    Family History Family History  Problem Relation Age of Onset  . Hypertension Father   . Food Allergy Son   . Colon cancer Neg Hx   . Stomach cancer Neg Hx   . Allergic rhinitis Neg Hx   . Angioedema Neg Hx   . Asthma Neg Hx   . Eczema Neg Hx   . Immunodeficiency Neg Hx   . Urticaria Neg Hx     Social History Social History   Tobacco Use  . Smoking status: Former Smoker    Packs/day: 1.00    Years: 10.00    Pack years: 10.00    Types: Cigarettes    Last attempt to quit: 1992    Years since quitting: 27.9  . Smokeless tobacco: Never Used  . Tobacco comment: Uses Hookah Pipe  occasionally  Substance Use Topics  . Alcohol use: No  . Drug use: No     Allergies   Tree extract; Pork-derived products; and Statins   Review of Systems Review of Systems  All other systems reviewed and are negative.    Physical Exam Updated Vital Signs BP (!) 171/102 (BP Location: Right Arm)   Pulse 80   Temp 98 F (36.7 C) (Oral)   Resp (!) 24   Ht 5\' 9"  (1.753 m)   Wt 115.7 kg   SpO2 100%   BMI 37.66 kg/m   Physical Exam  Constitutional: He is oriented to person, place, and time. He appears well-developed and well-nourished.  HENT:  Head: Normocephalic.  Eyes: EOM are normal.  Neck: Normal range of motion.  Pulmonary/Chest: Effort normal.  Abdominal: He exhibits no distension.  Musculoskeletal:  Normal left radial pulse.  Patient with laceration overlying the distal phalanx of the left index finger without active bleeding at this time.  He is able to flex and extend at the DIP joint of the left index finger.  Instability of the distal phalanx of the left index finger significant soft tissue defect overlying the middle phalanx of the left middle finger with inability to extend at the left middle finger PIP or DIP joint.  Normal flexion of the left middle finger.  Obvious dislocation of the left distal phalanx with soft tissue injury overlying the volar aspect.  Soft tissue laceration overlying the volar aspect of the left ring finger without active bleeding.  Normal flexion-extension of the left ring finger.  Neurological: He is alert and oriented to person, place, and time.  Psychiatric: He has a normal mood and affect.  Nursing note and vitals reviewed.      ED Treatments / Results  Labs (all labs ordered are listed, but only abnormal results are displayed) Labs Reviewed  CBC - Abnormal; Notable for the following components:      Result Value   RBC 5.83 (*)    All other components within normal limits  BASIC METABOLIC PANEL - Abnormal; Notable for the  following components:   Sodium 134 (*)    CO2 20 (*)    Glucose, Bld 122 (*)    All other components within normal limits    EKG None  Radiology Dg Chest 1 View  Result Date: 07/15/2018 CLINICAL DATA:  Table saw injury to LEFT hand EXAM: CHEST  1 VIEW COMPARISON:  Portable exam 1323 hours A. compared to 03/28/2018 FINDINGS: Borderline enlargement of cardiac silhouette. Mediastinal contours and pulmonary vascularity normal. Lungs clear. No acute infiltrate, pleural effusion, or pneumothorax. Osseous structures unremarkable. IMPRESSION: No acute abnormalities. Electronically Signed   By: Ulyses SouthwardMark  Boles M.D.   On: 07/15/2018 14:08   Dg Hand 2 View Left  Result Date: 07/15/2018 CLINICAL DATA:  Index and long finger distal interphalangeal fusion after table saw injury. EXAM: LEFT HAND - 2 VIEW COMPARISON:  07/15/2018 at 1:22 p.m. FINDINGS: Both the index and middle fingers half 2 axial retrograde K-wires extending through the distal phalanx down into the middle phalanx. Phalangeal alignment near anatomic. IMPRESSION: 1. K-wire fusion of the distal to the middle phalanges of the index and middle fingers, with near anatomic alignment. Electronically Signed   By: Gaylyn RongWalter  Liebkemann M.D.   On: 07/15/2018 20:20   Dg Hand 2 View Left  Result Date: 07/15/2018 CLINICAL DATA:  Laceration LEFT hand with a table saw EXAM: LEFT HAND - 2 VIEW COMPARISON:  None FINDINGS: Osseous mineralization normal. Comminuted displaced fracture through the base of the distal phalanx LEFT index finger with absent bone material. Associated fracture and bone debris at base of distal phalanx middle finger. Soft tissue deformities at the distal phalanges of the LEFT index and middle fingers. Soft tissue irregularity at distal phalanx ring finger without fracture or bone destruction. No definite involvement of the middle phalanges of the LEFT index and middle fingers. No additional focal osseous abnormalities identified. IMPRESSION:  Displaced fractures and deformities at the distal phalanges of the LEFT index and middle fingers as above. Soft tissue deformity distal phalanx LEFT ring finger without osseous abnormality. Electronically Signed   By: Ulyses SouthwardMark  Boles M.D.   On: 07/15/2018 14:07   Dg C-arm 1-60 Min  Result Date: 07/15/2018 CLINICAL DATA:  Repair  of left index and long fingers by fusion/repair of ring finger done in OR 8 EXAM: DG C-ARM 61-120 MIN COMPARISON:  None. FINDINGS: Fluoroscopy provided for 1 minutes 6 seconds. IMPRESSION: Fluoroscopy for 1 minutes 6. Electronically Signed   By: Bary Richard M.D.   On: 07/15/2018 20:21    Procedures .Critical Care Performed by: Azalia Bilis, MD Authorized by: Azalia Bilis, MD    CRITICAL CARE Performed by: Azalia Bilis Total critical care time: 32 minutes Critical care time was exclusive of separately billable procedures and treating other patients. Critical care was necessary to treat or prevent imminent or life-threatening deterioration. Critical care was time spent personally by me on the following activities: development of treatment plan with patient and/or surrogate as well as nursing, discussions with consultants, evaluation of patient's response to treatment, examination of patient, obtaining history from patient or surrogate, ordering and performing treatments and interventions, ordering and review of laboratory studies, ordering and review of radiographic studies, pulse oximetry and re-evaluation of patient's condition.   Medications Ordered in ED Medications  Tdap (BOOSTRIX) injection 0.5 mL (has no administration in time range)  sodium chloride 0.9 % bolus 1,000 mL (1,000 mLs Intravenous New Bag/Given 07/15/18 1337)  ceFAZolin (ANCEF) IVPB 2g/100 mL premix (2 g Intravenous Not Given 07/15/18 1354)  ceFAZolin (ANCEF) 1-4 GM/50ML-% IVPB (has no administration in time range)  0.9 %  sodium chloride infusion (1,000 mLs Intravenous New Bag/Given 07/15/18  1342)  HYDROmorphone (DILAUDID) injection 1 mg (1 mg Intravenous Given 07/15/18 1326)  ceFAZolin (ANCEF) 1-4 GM/50ML-% IVPB (1,000 mg  New Bag/Given 07/15/18 1345)     Initial Impression / Assessment and Plan / ED Course  I have reviewed the triage vital signs and the nursing notes.  Pertinent labs & imaging results that were available during my care of the patient were reviewed by me and considered in my medical decision making (see chart for details).     Open fractures of the left index and middle finger.  Ancef given.  Tetanus up-to-date.  Patient with extensor tendon injury of the left middle finger.  Both DIP joints are open joints.  Orthopedic hand surgery consultation requested  On Call Hand Surgeon: Dr Janee Morn.   To be taken to OR for definitive management. Transfer to [facility] by Dr Janee Morn after phone consultation   Final Clinical Impressions(s) / ED Diagnoses   Final diagnoses:  Open displaced fracture of distal phalanx of left index finger, initial encounter  Open displaced fracture of distal phalanx of left middle finger, initial encounter    ED Discharge Orders    None       Azalia Bilis, MD 07/16/18 [phone]

## 2018-07-15 NOTE — Op Note (Signed)
07/15/2018  4:26 PM  PATIENT:  Richard Walker  62 y.o. male  PRE-OPERATIVE DIAGNOSIS:  Saw injury to L IF/LF/RF  POST-OPERATIVE DIAGNOSIS:  Same, with complex multi-tissue injury to each  PROCEDURE:   L IF: Excisional debridement of skin/SQ associated with open fx     DIPJ primary arthrodesis     Intermediate repair of traumatic wound, 4.5cm      L LF: Excisional debridement of skin/SQ/tendon associated with open fx     DIPJ primary arthrodesis     Extensor tendon repair of central slip at PIPJ     Complex closure of complex traumatic wound, 10cm     L RF:  Excisional debridement of skin/SQ     Simple closure of traumatic wound, 4.5cm  SURGEON: Rayvon Char. Grandville Silos, MD  PHYSICIAN ASSISTANT: none  ANESTHESIA:  local and MAC  SPECIMENS:  None  DRAINS:   None  EBL:  less than 50 mL  PREOPERATIVE INDICATIONS:  Richard Walker is a  62 y.o. male with a saw injury to his L IF/LF/RF  The risks benefits and alternatives were discussed with the patient preoperatively including but not limited to the risks of infection, bleeding, nerve injury, cardiopulmonary complications, the need for revision surgery, among others, and the patient verbalized understanding and consented to proceed.  OPERATIVE IMPLANTS: 0.045 inch kwires x 4 & mini-juggerknot suture anchor  OPERATIVE PROCEDURE:  After receiving prophylactic antibiotics, the patient was escorted to the operative theatre and placed in a supine position.  A surgical "time-out" was performed during which the planned procedure, proposed operative site, and the correct patient identity were compared to the operative consent and agreement confirmed by the circulating nurse according to current facility policy.  Digital block was performed by me with each digit, using a total of 30 mL's of a mixture that was half percent plain l Marcaine mixed 50: 50 with 1% plain lidocaine.  Following application of a tourniquet to the operative extremity, the  exposed skin was prepped with a Hibiclens scrub brush before being formally prepped with Betadine and draped in the usual sterile fashion.  The limb was exsanguinated with gravity and the tourniquet inflated to approximately 125mHg higher than systolic BP.  All of the wounds were copiously irrigated first, and the damage was surveyed.  I excisionally debrided many jagged skin edges on all 3 digits, to include skin and subcutaneous tissues for the ring finger and index finger, including also jagged ill-appearing tendon on the long finger.  After debriding all of the wounds, they were again irrigated and attention directed to effusions.  The index finger had significant bone loss at the base of the distal phalanx as well as some of the head of the middle phalanx.  Any remaining cartilage left on the head of the needle phalanx was then removed with a curette and a rondure and the 2 approximated to one another and secured with 2 different 0.045 inch K wires it did not enter the PIP joint.  The alignment and stability was reasonable.  The pins were clipped under the skin at the tip of the digit.  Attention was shifted to the long finger, where it was determined that portions of the joint were also injured and devoid of cartilage.  There was a tendon injury of the extensor tendon both in zones 1 and 3.  At the PIP joint, the dorsal base of the middle phalanx was missing, and the extensor tendon disrupted from its attachment on  the base of the middle phalanx.  There was also a small chondral injury to the head of the proximal phalanx at the level of the PIP joint.  Due to the combined tendon injury and the intrinsic damage to the DIP joint, an effort was made to achieve primary fusion there as well.  The 2 articular surfaces were prepared with a curette and rondure, then approximated to one another and secured with 2 different 0.045 inch K wires, again not entering the PIP joint.  These were also clipped below the  skin at the tip of the digit and final fluoroscopic images were obtained.  A mini juggernaut was placed using fluoroscopic guidance also at the base of the middle phalanx of the long finger.  The extensor tendon was repaired at that level, reestablishing its approximation to the dorsal base of the middle phalanx, essentially repairing the open boutonniere injury.  This was done with the suture anchor and some extra 3 oh max braid suture from the anchor.  Once extensor tendon was repaired and the DIP joint fused, all of the soft tissue closure commenced.  The simplest of the lacerations was on the ring finger, and this required simply some interrupted sutures of 4-0 Vicryl Rapide..  On the index finger, the wound was more complicated, jagged, irregular, and entered into the edge of the nail matrix.  This required a little more sophisticated repair of the traumatic wound, again with 4-0 Vicryl Rapide interrupted sutures.  Attention was then shifted to the most complex wound, that of the long finger.  All in all it was at least 10 cm in length, and there was a very long narrow proximally based dorsal flap created over the surface of the middle phalanx.  Gaining coverage over the deep structures was a little tricky, but ultimately done, again with 4-0 Vicryl Rapide sutures.  A short arm splint dressing was applied, the tourniquet released, and he was taken to the recovery room in stable condition.  DISPOSITION: He will remain in his postop splint dressing until returning to the office.  The office will call him Monday to arrange for follow-up.  At that time there should be new x-rays of the left hand, being sure to get good laterals of the index and long fingers, and then likely traversing to therapy to have custom protective splints fabricated and begin rehab.  We will need to be mindful of the extensor tendon repair in zone 3 of the long finger as we also wish not for the PIP joint to get excessively  stiff.

## 2018-07-17 ENCOUNTER — Encounter (HOSPITAL_COMMUNITY): Payer: Self-pay | Admitting: Orthopedic Surgery

## 2018-07-25 ENCOUNTER — Ambulatory Visit (INDEPENDENT_AMBULATORY_CARE_PROVIDER_SITE_OTHER): Payer: Medicaid Other | Admitting: Medical

## 2018-07-25 ENCOUNTER — Encounter: Payer: Self-pay | Admitting: Medical

## 2018-07-25 VITALS — BP 127/83 | HR 67 | Temp 98.2°F | Resp 16 | Ht 68.0 in | Wt 253.0 lb

## 2018-07-25 DIAGNOSIS — E785 Hyperlipidemia, unspecified: Secondary | ICD-10-CM | POA: Diagnosis not present

## 2018-07-25 DIAGNOSIS — E559 Vitamin D deficiency, unspecified: Secondary | ICD-10-CM | POA: Diagnosis not present

## 2018-07-25 DIAGNOSIS — R5383 Other fatigue: Secondary | ICD-10-CM

## 2018-07-25 DIAGNOSIS — R739 Hyperglycemia, unspecified: Secondary | ICD-10-CM

## 2018-07-25 MED ORDER — EZETIMIBE 10 MG PO TABS: 10.0000 mg | ORAL_TABLET | Freq: Every day | ORAL | 11 refills | Status: DC

## 2018-07-25 MED ORDER — VITAMIN D (ERGOCALCIFEROL) 1.25 MG (50000 UNIT) PO CAPS: 50000.0000 [IU] | ORAL_CAPSULE | ORAL | 0 refills | Status: DC

## 2018-07-25 NOTE — Patient Instructions (Addendum)
Your fatigue work-up the other day did not show any striking abnormality but it did show a low vitamin D and lower and B12 level.  This combined might be responsible for some of your fatigue.  I want you to take vitamin D weekly for 8 weeks then repeat vitamin D level.  Also please start to take oral B12 vitamin daily.  In about 4 weeks we will repeat those test as that will be your 8-week mark.  For high cholesterol, I did refill your Zetia.  Please take that daily.  This will reduce your cardiovascular risk score.  Unfortunately you are not able to tolerate statins.  We will get fasting lipid panel in 4 weeks as well.  You have had some recent lower and potassium levels and slight high magnesium levels.  Try to increase potassium in your diet and stop any over-the-counter magnesium supplements.  We will recheck these levels in 4 weeks as well.   Esperanza RichtersEdward Sari Cogan, PA-C Will determine your follow-up date after reviewing future labs.

## 2018-07-25 NOTE — Progress Notes (Signed)
Subjective:    Patient ID: Richard Walker, male    DOB: 19-Jul-1956, 62 y.o.   MRN: [id number]  HPI  Pt in for follow up.  Pt had hand injury. Pt had surgery on hand after saw injury(recently over Thanksgiving). He is following up with hand surgeon this Thursday.  Pt had hx of fatigue in past. Saw him on last visit for this. Work up showed  Mild low sodium, good kidney function, no anemia, mild high magnesium, and lower end potassium range.   Vitamin D was low.  B-12 was in lower range of normal.   Pt is on metformin and he is prediabetic. Pt also wants to loose weight.      Review of Systems  Constitutional: Positive for fatigue. Negative for chills and fever.  Respiratory: Negative for cough, chest tightness, shortness of breath and wheezing.   Cardiovascular: Negative for chest pain and palpitations.  Gastrointestinal: Negative for abdominal pain.  Musculoskeletal: Negative for gait problem.       See hpi.  Neurological: Negative for dizziness, syncope, weakness, light-headedness, numbness and headaches.  Hematological: Negative for adenopathy. Does not bruise/bleed easily.  Psychiatric/Behavioral: Negative for agitation and confusion.   Past Medical History:  Diagnosis Date  . DDD (degenerative disc disease), lumbar    L3/L4  . Diverticulosis   . GERD (gastroesophageal reflux disease)   . Hyperlipidemia   . Low sperm motility   . Pre-diabetes   . Sleep apnea    uses cpap   . SVT (supraventricular tachycardia) (HCC)      Social History   Socioeconomic History  . Marital status: Married    Spouse name: Not on file  . Number of children: Not on file  . Years of education: Not on file  . Highest education level: Not on file  Occupational History  . Not on file  Social Needs  . Financial resource strain: Not on file  . Food insecurity:    Worry: Not on file    Inability: Not on file  . Transportation needs:    Medical: Not on file    Non-medical: Not on  file  Tobacco Use  . Smoking status: Former Smoker    Packs/day: 1.00    Years: 10.00    Pack years: 10.00    Types: Cigarettes    Last attempt to quit: 1992    Years since quitting: 27.9  . Smokeless tobacco: Never Used  . Tobacco comment: Uses Hookah Pipe occasionally  Substance and Sexual Activity  . Alcohol use: No  . Drug use: No  . Sexual activity: Yes  Lifestyle  . Physical activity:    Days per week: Not on file    Minutes per session: Not on file  . Stress: Not on file  Relationships  . Social connections:    Talks on phone: Not on file    Gets together: Not on file    Attends religious service: Not on file    Active member of club or organization: Not on file    Attends meetings of clubs or organizations: Not on file    Relationship status: Not on file  . Intimate partner violence:    Fear of current or ex partner: Not on file    Emotionally abused: Not on file    Physically abused: Not on file    Forced sexual activity: Not on file  Other Topics Concern  . 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     Alcohol Use:  None   Drug Use:  None   Diet:  Regular   Exercise:  None   Hobbies:  Cards    Past Surgical History:  Procedure Laterality Date  . 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 REPAIR OF LEFT INDEX AND LONG FINGER BY FUSION /  REPAIR OF RING FINGER;  Surgeon: Mack Hookhompson, David, MD;  [facility];  Service: Orthopedics;  Laterality: Left;  . BACK SURGERY    . CARDIAC CATHETERIZATION N/A 12/22/2015   Procedure: Left Heart Cath and Coronary Angiography;  Surgeon: Peter M SwazilandJordan, MD;  [facility];  Service: Cardiovascular;  Laterality: N/A;  . CARDIAC ELECTROPHYSIOLOGY STUDY AND ABLATION    . ELECTROPHYSIOLOGIC STUDY N/A 02/19/2016   Procedure: SVT Ablation;  Surgeon: Marinus MawGregg W Taylor, MD;  [facility];  Service: Cardiovascular;  Laterality: N/A;  . septal reconstruction  2017  . TURBINATE REDUCTION Bilateral 07/2016  . WISDOM TOOTH EXTRACTION      Family History  Problem Relation Age of Onset  . Hypertension Father   . Food Allergy Son   . Colon cancer Neg Hx   . Stomach cancer Neg Hx   . Allergic rhinitis Neg Hx   . Angioedema Neg Hx   . Asthma Neg Hx   . Eczema Neg Hx   . Immunodeficiency Neg Hx   . Urticaria Neg Hx     Allergies  Allergen Reactions  . Tree Extract   . Pork-Derived Products Swelling  . Statins     Muscle aches    Current Outpatient Medications on File Prior to Visit  Medication Sig Dispense Refill  . acetaminophen (TYLENOL) 325 MG tablet Take 2 tablets (650 mg total) by mouth every 6 (six) hours.    [name]. albuterol (PROAIR HFA) 108 (90 Base) MCG/ACT inhaler Inhale 1-2 puffs into the lungs every 6 (six) hours as needed for wheezing or shortness of breath. 1 Inhaler 0  . aspirin 81 MG tablet Take 81 mg by mouth daily.    [name]. azelastine (ASTELIN) 0.1 % nasal spray 1-2 sprays per nostril 2 times daily as needed. 30 mL 5  . budesonide-formoterol (SYMBICORT) 160-4.5 MCG/ACT inhaler Inhale 2 puffs into the lungs 2 (two) times daily. 1 Inhaler 0  . famotidine-calcium carbonate-magnesium hydroxide (PEPCID COMPLETE) 10-800-165 MG chewable tablet Chew by mouth.    . flecainide (TAMBOCOR) 50 MG tablet TAKE 1 TABLET BY MOUTH AS NEEDED FOR PALPITATIONS DESPITE TAKING METOPROLOL. NOT TO EXCEED 6 TABLETS IN 24 HOURS 45 tablet 3  . fluticasone (FLONASE) 50 MCG/ACT nasal spray Place 1 spray into both nostrils daily.    [name]. HYDROcodone-acetaminophen (NORCO) 5-325 MG tablet Take 1 tablet by mouth every 6 (six) hours as needed for moderate pain. 6 tablet 0   . HYDROcodone-acetaminophen (NORCO/VICODIN) 5-325 MG tablet Take 1-2 tablets by mouth every 6 (six) hours as needed for moderate pain or severe pain. 20 tablet 0  . ibuprofen (ADVIL) 200 MG tablet Take 3 tablets (600 mg total) by mouth every 6 (six) hours.    [name]. levocetirizine (XYZAL) 5 MG tablet  TAKE 1 TABLET(5 MG) BY MOUTH EVERY EVENING 90 tablet 0  . metFORMIN (GLUCOPHAGE) 500 MG tablet Take 1 tablet (500 mg total) by mouth 2 (two) times daily with a meal. 60 tablet 3  . metoprolol tartrate (LOPRESSOR) 25 MG tablet TAKE 1 TABLET(25 MG) BY MOUTH TWICE DAILY (Patient taking differently: No sig reported) 180 tablet 2  . montelukast (SINGULAIR) 10 MG tablet Take 1 tablet once at night for coughing or wheezing. 30 tablet 1  . oxyCODONE (ROXICODONE) 5 MG immediate release tablet Take 0-1 tablets (0-5 mg total) by mouth every 6 (six) hours as needed (severe postop pain). 28 tablet 0  . pantoprazole (PROTONIX) 40 MG tablet TAKE 1 TABLET(40 MG) BY MOUTH TWICE DAILY BEFORE A MEAL 60 tablet 0  . predniSONE (DELTASONE) 10 MG tablet Take 1 tablet by mouth daily.    [name] PRESCRIPTION MEDICATION Inhale into the lungs at bedtime. CPAP    . sildenafil (REVATIO) 20 MG tablet Take 1-2 tablets (20-40 mg total) by mouth as needed. 60 tablet 1   No current facility-administered medications on file prior to visit.     BP 127/83   Pulse 67   Temp 98.2 F (36.8 C) (Oral)   Resp 16   Ht 5\' 8"  (1.727 m)   Wt 253 lb (114.8 kg)   SpO2 99%   BMI 38.47 kg/m       Objective:   Physical Exam  General Mental Status- Alert. General Appearance- Not in acute distress.   Skin General: Color- Normal Color. Moisture- Normal Moisture.  Neck Carotid Arteries- Normal color. Moisture- Normal Moisture. No carotid bruits. No JVD.  Chest and Lung Exam Auscultation: Breath Sounds:-Normal.  Cardiovascular Auscultation:Rythm- Regular. Murmurs & Other Heart Sounds:Auscultation of the heart reveals- No  Murmurs.  Abdomen Inspection:-Inspeection Normal. Palpation/Percussion:Note:No mass. Palpation and Percussion of the abdomen reveal- Non Tender, Non Distended + BS, no rebound or guarding.    Neurologic Cranial Nerve exam:- CN III-XII intact(No nystagmus), symmetric smile. Strength:- 5/5 equal and symmetric strength both upper and lower extremities.  Left hand- dressed and wrapped. Pt has left upper ext sling.      Assessment & Plan:  Your fatigue work-up the other day did not show any striking abnormality but it did show a low vitamin D and lower and B12 level.  This combined might be responsible for some of your fatigue.  I want you to take vitamin D weekly for 8 weeks then repeat vitamin D level.  Also please start to take oral B12 vitamin daily.  In about 4 weeks we will repeat those test as that will be your 8-week mark.  For high cholesterol, I did refill your Zetia.  Please take that daily.  This will reduce your cardiovascular risk score.  Unfortunately you are not able to tolerate statins.  We will get fasting lipid panel in 4 weeks as well.  You have had some recent lower and potassium levels and slight high magnesium levels.  Try to increase potassium in your diet and stop any over-the-counter magnesium supplements.  We will recheck these levels in 4 weeks as well.  Will determine your follow-up date after reviewing future labs.

## 2018-07-26 ENCOUNTER — Telehealth: Payer: Self-pay

## 2018-07-26 ENCOUNTER — Encounter: Payer: Self-pay | Admitting: *Deleted

## 2018-07-26 ENCOUNTER — Ambulatory Visit: Payer: Medicaid Other | Attending: Medical | Admitting: *Deleted

## 2018-07-26 ENCOUNTER — Other Ambulatory Visit: Payer: Self-pay

## 2018-07-26 DIAGNOSIS — M25542 Pain in joints of left hand: Secondary | ICD-10-CM | POA: Diagnosis present

## 2018-07-26 DIAGNOSIS — M25642 Stiffness of left hand, not elsewhere classified: Secondary | ICD-10-CM | POA: Insufficient documentation

## 2018-07-26 DIAGNOSIS — M79642 Pain in left hand: Secondary | ICD-10-CM

## 2018-07-26 DIAGNOSIS — R278 Other lack of coordination: Secondary | ICD-10-CM | POA: Insufficient documentation

## 2018-07-26 NOTE — Patient Instructions (Addendum)
WEARING SCHEDULE:  Wear splint at ALL times except for hygiene care (May remove splint for exercises and then immediately place back on ONLY if directed by the therapist)  PURPOSE:  To prevent movement and for protection until injury can heal  CARE OF SPLINT:  Keep splint away from heat sources including: stove, radiator or furnace, or a car in sunlight. The splint can melt and will no longer fit you properly  Keep away from pets and children  Clean the splint with rubbing alcohol as needed.  * During this time, make sure you also clean your hand/arm as instructed by your therapist and/or perform dressing changes as needed. Then dry hand/arm completely before replacing splint. (When cleaning hand/arm, keep it immobilized in same position until splint is replaced)  PRECAUTIONS/POTENTIAL PROBLEMS: *If you notice or experience increased pain, swelling, numbness, or a lingering reddened area from the splint: Contact your therapist by calling [phone]. You must wear the splint for protection, but we will get you scheduled for adjustments as quickly as possible.  (If only straps or hooks need to be replaced and NO adjustments to the splint need to be made, just call the office ahead and let them know you are coming in)  If you have any medical concerns or signs of infection, please call your doctor immediately  PIP Flexion (Active)    Keeping large knuckles straight, bend the middle joint of _your index and middle_ fingers, or of all fingers, as far as possible. Hold __5__ seconds. Repeat __10__ times. Do _4-6__ sessions per day.  MP Flexion (Active)    With back of hand on table, bend large knuckles as far as they will go, keeping small joints straight. Repeat __10__ times. Do __4-6__ sessions per day.  .   [facility] on 07/[facility]/18 [address] [place], [place] [address] [phone]

## 2018-07-26 NOTE — Therapy (Signed)
[facility] [address] [place], [place], [address] Phone: [phone]   Fax:  [phone]  Occupational Therapy Evaluation  Patient Details  Name: Richard Walker MRN: [id number] Date of Birth: 1956-01-21 Referring Provider (OT): Dr Janee Morn   Encounter Date: 07/26/2018  OT End of Session - 07/26/18 1253    Visit Number  1    Number of Visits  --   Pt has Medicaid, requesting Eval +3 additional visits over next 2 weeks   Date for OT Re-Evaluation  10/18/18    Authorization Type  Pt has Medicaid, requesting Eval + 3 additional visits over next 2 weeks. Will need to request additional visits based on healing, need for wound care and HEP etc...    OT Start Time  0930    OT Stop Time  1114    OT Time Calculation (min)  104 min    Activity Tolerance  Patient tolerated treatment well;Patient limited by pain    Behavior During Therapy  [facility] for tasks assessed/performed;Anxious       Past Medical History:  Diagnosis Date  . DDD (degenerative disc disease), lumbar    L3/L4  . Diverticulosis   . GERD (gastroesophageal reflux disease)   . Hyperlipidemia   . Low sperm motility   . Pre-diabetes   . Sleep apnea    uses cpap   . SVT (supraventricular tachycardia) (HCC)     Past Surgical History:  Procedure Laterality Date  . 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 REPAIR OF LEFT INDEX AND LONG FINGER BY FUSION /  REPAIR OF RING FINGER;  Surgeon: Mack Hook, MD;  Location: [facility] OR;  Service: Orthopedics;  Laterality: Left;  . BACK SURGERY    . CARDIAC CATHETERIZATION N/A 12/22/2015   Procedure: Left Heart Cath and Coronary Angiography;  Surgeon: Peter M Swaziland, MD;  Location: [facility] INVASIVE CV LAB;  Service: Cardiovascular;  Laterality: N/A;  . CARDIAC ELECTROPHYSIOLOGY STUDY AND ABLATION    . ELECTROPHYSIOLOGIC STUDY N/A 02/19/2016   Procedure: SVT Ablation;  Surgeon: Marinus Maw, MD;  Location: [facility] INVASIVE CV LAB;   Service: Cardiovascular;  Laterality: N/A;  . septal reconstruction  2017  . TURBINATE REDUCTION Bilateral 07/2016  . WISDOM TOOTH EXTRACTION      There were no vitals filed for this visit.  Subjective Assessment - 07/26/18 0937    Subjective   Pt is a 62 y/o male, whom presents with orders for protective splinting and HEP following a saw injury to his left hand on 07/15/18 and surgery was later the same day.     Pertinent History  Pt repotrs pain that is keeping him awake at noght since he ran out of pain medicine 07/25/18    Patient Stated Goals  Decreased pain, get use of hand back    Currently in Pain?  Yes    Pain Score  7     Pain Location  Hand    Pain Orientation  Left    Pain Descriptors / Indicators  Aching;Sore    Pain Type  Acute pain;Surgical pain    Pain Onset  1 to 4 weeks ago    Pain Frequency  Constant    Multiple Pain Sites  No        [facility] OT Assessment - 07/26/18 0001      Assessment   Medical Diagnosis  Saw injury L IF, LF, RF    Referring Provider (OT)  Dr Janee Morn  Onset Date/Surgical Date  07/15/18    Hand Dominance  Right    Next MD Visit  07/27/18    Prior Therapy  N/A      Precautions   Precautions  Other (comment)   Protective splinting L hand     Balance Screen   Has the patient fallen in the past 6 months  No    Has the patient had a decrease in activity level because of a fear of falling?   No    Is the patient reluctant to leave their home because of a fear of falling?   No      Home  Environment   Family/patient expects to be discharged to:  Private residence    Living Arrangements  --   Family   Available Help at Discharge  Family    Lives With  Family      Prior Function   Level of Independence  Independent    Vocation  Other (comment)   Manager in resturant currently unemployed     ADL   ADL comments  Pt is requiring assistance to open items as well as activtiies that require bilateral hand use.      Written Expression    Dominant Hand  Right      Cognition   Overall Cognitive Status  Within Functional Limits for tasks assessed      Observation/Other Assessments   Observations  Pt presents to clinic in post op splint/bulky dressing L non-dominant hand of finger extension and neutral wrist.    Skin Integrity  Pt is with noted open wounds on left IF, LF and RF. The greatest area was dorsal left LF. Pt had a large amount of dried blood as well as current bleeding on post-op dressing mostly from dorsal LF upon arrival to clinic today.      Sensation   Light Touch  Appears Intact      Coordination   Coordination  Impaired secondary to current injury/repairs, edema - No A/ROM was attempted at DIP joints however.      Edema   Edema  Pt was with moderate edema noted Left index, the length of his LF and RF at DIP joint   Left hand both volar and dorsal edema noted & RF ulnar side               OT Treatments/Exercises (OP) - 07/26/18 0001      Splinting   Splinting  Pt's bulky dressing was removed in clinic taking care to maintain finger extension throughout session. His post-op dressing was noted to have a significant amount of post-op/dried blood as well as a moderate amount of active serosanguinous drainage & some active bleeding especially along his dorsal long finger. Pt's wounds were noted to be open in multiple areas of his index, long and ring fingers, but again mostly along his dorsal LF. Care was taken to re-dress wounds using xeroform, sterile 2x2's, 1"gauze and stockinette. Pt was educated to keep hand dry at all times and replace dressings PRN. He was issued extra xeroform, 2x2's and gauze. He was ithen fitted with custom fabricated volar based  DIP extension splints to his index and long fingers as per Dr Carollee Massed orders. He was then educated in gentle active ROM to left hand for MCP and PIPJ's within the confines of his dressings and with the volar DIP splints in place. He was educated to not  remove these and to not perform A/ROM. He plans to  f/u with Dr Janee Morn tomorrow, 07/27/18. He was given handouts for splinting use, care and precautions as well as HEP. Pt reports that he has been having increased pain and throbbing in his left hand "since the surgery" and he was educated to call Dr Carollee Massed office for recommendations of what he may take to assist in relieving his pain. He verbalized understanding of all of this in the clinic today.        WEARING SCHEDULE:  Wear splint at ALL times except for hygiene care (May remove splint for exercises and then immediately place back on ONLY if directed by the therapist)  PURPOSE:  To prevent movement and for protection until injury can heal  CARE OF SPLINT:  Keep splint away from heat sources including: stove, radiator or furnace, or a car in sunlight. The splint can melt and will no longer fit you properly  Keep away from pets and children  Clean the splint with rubbing alcohol as needed.  * During this time, make sure you also clean your hand/arm as instructed by your therapist and/or perform dressing changes as needed. Then dry hand/arm completely before replacing splint. (When cleaning hand/arm, keep it immobilized in same position until splint is replaced)  PRECAUTIONS/POTENTIAL PROBLEMS: *If you notice or experience increased pain, swelling, numbness, or a lingering reddened area from the splint: Contact your therapist by calling [phone]. You must wear the splint for protection, but we will get you scheduled for adjustments as quickly as possible.  (If only straps or hooks need to be replaced and NO adjustments to the splint need to be made, just call the office ahead and let them know you are coming in)  If you have any medical concerns or signs of infection, please call your doctor immediately  PIP Flexion (Active)    Keeping large knuckles straight, bend the middle joint of _your index and middle_ fingers, or of all  fingers, as far as possible. Hold __5__ seconds. Repeat __10__ times. Do _4-6__ sessions per day.  MP Flexion (Active)    With back of hand on table, bend large knuckles as far as they will go, keeping small joints straight. Repeat __10__ times. Do __4-6__ sessions per day.  .   Return to [facility] on 07/27/18 [address] [place], [place] [address] [phone]     OT Education - 07/26/18 1251    Education Details  Spinting use, care and precautions, home program, possible s/s of infection, keep L hand dry at all times and change dressings as discussed in clinic today.    Person(s) Educated  Patient    Methods  Explanation;Demonstration;Verbal cues;Handout    Comprehension  Verbalized understanding       OT Short Term Goals - 07/26/18 1320      OT SHORT TERM GOAL #1   Title  Pt will be Mod I splinting use, care and precautions    Baseline  vc's required and min assist to apply on fingers    Time  4    Period  Weeks    Status  New    Target Date  08/23/18      OT SHORT TERM GOAL #2   Title  Pt will be Mod I initial HEP Left hand/fingers    Baseline  Min VC's and tc's required    Time  4    Period  Weeks    Status  New    Target Date  08/23/18      OT SHORT  TERM GOAL #3   Title  Pt will indepdendently verbalize dressing changes &  possible signs and symptoms of infection    Baseline  Min VC's required    Time  4    Period  Weeks    Status  New    Target Date  08/23/18      OT SHORT TERM GOAL #4   Title  Pt will be Mod I edema control techniques LUE    Baseline  required Min vc's to implement    Time  4    Period  Weeks    Status  New    Target Date  08/23/18        OT Long Term Goals - 07/26/18 1323      OT LONG TERM GOAL #1   Title  Pt will be Mod I updated HEP L hand/fingers    Baseline  dependent    Time  8    Period  Weeks    Status  New    Target Date  09/20/18      OT LONG TERM GOAL #2   Title  Pt will demonstrate functional A/ROM L hand  as seen by ability to make flat fist    Baseline  dependent    Time  8    Period  Weeks    Status  New    Target Date  09/20/18      OT LONG TERM GOAL #3   Title  Pt will be Mod I scar management L hand/fingers    Baseline  dependent    Time  8    Period  Weeks    Status  New    Target Date  09/20/18      OT LONG TERM GOAL #4   Title  Pt will report use of left hand for light household duties as non-dominant hand    Baseline  unable     Time  8    Period  Weeks    Status  New    Target Date  09/20/18      OT LONG TERM GOAL #5   Title  Pt will report pain left hand as 3/10 or less during ADL's and light functional activities in household setting.    Baseline  07/26/18 pain = 6/10    Time  8    Period  Weeks    Status  New    Target Date  09/20/18            Plan - 07/26/18 1257    Clinical Impression Statement  Pt is a pleasant 62 y/o right hand dominanat male whom sustained a traumatic saw injury to his left non-dominant hand on 07/15/18. This injury included his left index, long and ring fingers (L Index finger: excisional debridement of skin/SQ associated w/ open fracture, DIPJ primary arthrodesis & intermediate repair of traumatic wound, 4.5 cm; L Long finger excisional debridement of skin/SQ/tendon associated with open fracture, DIPJ primary arthrodesis, extensor tendon repair of central slip at PIPJ and complex closure of complex traumatic wound 10 cm. As well as L Ring finger: excisional debriendement of skin/SQ, simple closure of traumatic wound 4.5 cm.). (ICD10 codes: S62.601B; S62.603B, S61; M20.022).  Pt's bulky dressing was removed in clinic taking care to maintain finger extension throughout session. His post-op dressing was noted to have a significant amount of post-op/dried blood as well as a moderate amount of active serosanguinous drainage & some active bleeding especially along his dorsal  long finger. Pt's wounds were noted to be open in multiple areas of his  index, long and ring fingers, but again mostly along his dorsal LF. Care was taken to re-dress wounds using xeroform, sterile 2x2's, 1"gauze and stockinette. Pt was educated to keep hand dry at all times and replace dressings PRN. He was issued extra xeroform, 2x2's and gauze. He was ithen fitted with custom fabricated volar based  DIP extension splints to his index and long fingers as per Dr Carollee Massed orders. He was then educated in gentle active ROM to left hand for MCP and PIPJ's within the confines of his dressings and with the volar DIP splints in place. He was educated to not remove these and to not perform A/ROM. He plans to f/u with Dr Janee Morn tomorrow, 07/27/18. He was given handouts for splinting use, care and precautions as well as HEP. This patient should benefit from continued out-pt therapy to assist with wound care, upgrading of HEP as per protocol, wound/tendon healing and Dr Janee Morn recommendations, splint check and adjustments in preparation for increased functional use of L non-dominant hand following this saw injury.    Occupational performance deficits (Please refer to evaluation for details):  ADL's;Rest and Sleep;Work;Leisure;IADL's    Rehab Potential  Good    Current Impairments/barriers affecting progress:  Pain and open wounds L index, long and ring fingers    OT Frequency  Other (comment)   Eval + request for 3 additional visits over next 2 weeks.   OT Duration  Other (comment)   See above. Will need to request additional visits from [facility]   OT Treatment/Interventions  Therapeutic exercise;Splinting;Patient/family education;Fluidtherapy;Scar mobilization;Therapeutic activities;Manual Therapy;Passive range of motion;Ultrasound;Paraffin;Self-care/ADL training    Plan  Dressing change PRN, Spint(s) check and adjustments, review and upgrade HEP as appropriate L hand (Note: No A/ROM IF/LF DIP's).    Clinical Decision Making  Several treatment options, min-mod task modification  necessary    Recommended Other Services  F/u with Dr Janee Morn 07/27/18 as planned    Consulted and Agree with Plan of Care  Patient       Patient will benefit from skilled therapeutic intervention in order to improve the following deficits and impairments:  Decreased skin integrity, Increased edema, Impaired flexibility, Pain, Decreased coordination, Decreased mobility, Decreased scar mobility, Decreased activity tolerance, Decreased range of motion, Decreased strength, Decreased knowledge of precautions, Impaired UE functional use  Visit Diagnosis: Pain in joint of left hand - Plan: Ot plan of care cert/re-cert  Pain in left hand - Plan: Ot plan of care cert/re-cert  Other lack of coordination - Plan: Ot plan of care cert/re-cert  Stiffness of left hand, not elsewhere classified - Plan: Ot plan of care cert/re-cert    Problem List Patient Active Problem List   Diagnosis Date Noted  . Pain in right foot 03/30/2018  . Perennial and seasonal allergic rhinitis 11/09/2017  . History of food allergy 11/09/2017  . Wheezing 11/09/2017  . Dyspnea/wheezing 10/31/2017  . Cough 10/31/2017  . Bronchitis, mucopurulent recurrent (HCC) 09/07/2017  . SVT (supraventricular tachycardia) (HCC) 04/23/2015  . Abdominal pain, epigastric 06/03/2013  . Unspecified vitamin D deficiency 04/09/2013  . Esophageal reflux 04/09/2013  . Hepatitis B carrier (HCC) 04/09/2013  . Other and unspecified hyperlipidemia 04/09/2013  . Unspecified sleep apnea 04/09/2013    Richard Walker, OTR/L 07/26/2018, 1:34 PM  [facility] [address] [place], [place], [address] Phone: [phone]   Fax:  [phone]  Name:  Richard Walker MRN: [id number] Date of Birth: 01-Jan-1956

## 2018-07-26 NOTE — Telephone Encounter (Signed)
Spoke with patient regarding his cpap equipment. He is with [employer] and has a balance from [employer] on his cpap machine. [employer] will not bill medicare until his balance is paid or payment arrangements made. He needs supplies since his are over a year old. His machine is working well. Can we send studies and [facility]. I informed hi that [facility] will not help with machine if something happens. He will need to reach out to [employer] and get his bill straight.

## 2018-07-26 NOTE — Telephone Encounter (Signed)
[facility]. Will wait to hear their response.

## 2018-07-27 ENCOUNTER — Other Ambulatory Visit: Payer: Self-pay | Admitting: Neurology

## 2018-07-27 DIAGNOSIS — Z9989 Dependence on other enabling machines and devices: Principal | ICD-10-CM

## 2018-07-27 DIAGNOSIS — G4733 Obstructive sleep apnea (adult) (pediatric): Secondary | ICD-10-CM

## 2018-07-27 NOTE — Telephone Encounter (Signed)
[facility]

## 2018-07-28 ENCOUNTER — Telehealth: Payer: Self-pay | Admitting: Adult Health

## 2018-07-28 NOTE — Telephone Encounter (Signed)
Called patient and [facility]. Gave him [facility] number and advised he call if he hasn't heard form them in several days. He stated he would wait until Tues. He verbalized understanding, appreciation.

## 2018-07-28 NOTE — Telephone Encounter (Signed)
Pt requesting a call stating he has ran out of supplies. Stating he cannot get in touch with [employer]

## 2018-07-31 ENCOUNTER — Ambulatory Visit (INDEPENDENT_AMBULATORY_CARE_PROVIDER_SITE_OTHER): Payer: Medicaid Other | Admitting: Podiatry

## 2018-07-31 DIAGNOSIS — M7752 Other enthesopathy of left foot: Secondary | ICD-10-CM | POA: Diagnosis not present

## 2018-07-31 DIAGNOSIS — M7751 Other enthesopathy of right foot: Secondary | ICD-10-CM | POA: Diagnosis not present

## 2018-08-01 NOTE — Progress Notes (Signed)
   HPI: 62 year old male presenting today for follow up evaluation of pain to the 1st MPJ bilaterally. He reports continued pain that has not improved since his last visit. Resting the feet help to alleviate the symptoms. Walking increases the pain. He states the injections only improved the pain for about three days. Patient is here for further evaluation and treatment.   Past Medical History:  Diagnosis Date  . DDD (degenerative disc disease), lumbar    L3/L4  . Diverticulosis   . GERD (gastroesophageal reflux disease)   . Hyperlipidemia   . Low sperm motility   . Pre-diabetes   . Sleep apnea    uses cpap   . SVT (supraventricular tachycardia) (HCC)      Physical Exam: General: The patient is alert and oriented x3 in no acute distress.  Dermatology: Skin is warm, dry and supple bilateral lower extremities. Negative for open lesions or macerations.  Vascular: Palpable pedal pulses bilaterally. No edema or erythema noted. Capillary refill within normal limits.  Neurological: Epicritic and protective threshold grossly intact bilaterally.   Musculoskeletal Exam: Pain with palpation to the 1st MPJ bilaterally. Range of motion within normal limits to all pedal and ankle joints bilateral. Muscle strength 5/5 in all groups bilateral.   Assessment: 1. 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 Patient evaluated.  2. Injections only helped for three days.  3. Patient recently had a traumatic injury to the left fingers with a table saw that was surgically repaired.  4. Patient did not take Medrol Dose Pak.  5. Recommended OTC insoles and good shoe gear.  6. Return to clinic in 8 weeks.      Felecia ShellingBrent M. Saraih Lorton, DPM [facility]  Dr. Felecia ShellingBrent M. Vicky Mccanless, DPM    [address]                                        [place], [place] [address]                Office [phone]  Fax [phone]

## 2018-08-04 ENCOUNTER — Telehealth: Payer: Self-pay

## 2018-08-04 MED ORDER — OSELTAMIVIR PHOSPHATE 75 MG PO CAPS: 75.0000 mg | ORAL_CAPSULE | Freq: Two times a day (BID) | ORAL | 0 refills | Status: DC

## 2018-08-04 NOTE — Telephone Encounter (Signed)
[facility] can give info

## 2018-08-04 NOTE — Telephone Encounter (Signed)
Copied from CRM [id number]. Topic: General - Other >> Aug 04, 2018 12:16 PM Elliot GaultBell, Tiffany M wrote: Relation to pt: self  Call back number: [phone] [id number] [place] [place] [address]  Reason for call: \Patient son Reeves DamYousef, Jahsem was dx with the flu today by Dr. Drue NovelPaz. Patient requesting Tamiflu, please advise

## 2018-08-04 NOTE — Telephone Encounter (Signed)
Rx tamiflu sent to pt pharmacy. Start earliest as possible for any flu like symptoms.

## 2018-08-04 NOTE — Telephone Encounter (Signed)
Tried to reach pt no answer. Okay for [facility] to give information.  

## 2018-08-08 ENCOUNTER — Ambulatory Visit: Payer: Medicaid Other | Admitting: Occupational Therapy

## 2018-08-08 DIAGNOSIS — M79642 Pain in left hand: Secondary | ICD-10-CM

## 2018-08-08 DIAGNOSIS — M25542 Pain in joints of left hand: Secondary | ICD-10-CM | POA: Diagnosis not present

## 2018-08-08 DIAGNOSIS — R278 Other lack of coordination: Secondary | ICD-10-CM

## 2018-08-08 DIAGNOSIS — M25642 Stiffness of left hand, not elsewhere classified: Secondary | ICD-10-CM

## 2018-08-08 NOTE — Therapy (Signed)
Title  Pt will report pain left hand as 3/10 or less during ADL's and light functional activities in household setting.    Baseline  07/26/18 pain = 6/10    Time  8    Period  Weeks    Status  New    Target Date  09/20/18            Plan - 08/08/18 1312    Clinical Impression Statement  Pt arrived for therapy unprotected. Pt reports that MD removed dressings and previous splints. Therapist called MD office and spoke with Richard Walker assistant. Verbal orders received  that pt needs to have continued fingertip splints for index and middle finger tip joints and that ROM needs to be encouraged to PIP's and MP's.    Occupational performance deficits (Please refer to evaluation for details):  ADL's;Rest and Sleep;Work;Leisure;IADL's    Rehab Potential  Good    Current Impairments/barriers affecting progress:  Pain and open wounds L index, long and ring fingers    OT Frequency  --   3 visits   OT Duration  2 weeks    OT Treatment/Interventions  Therapeutic exercise;Splinting;Patient/family education;Fluidtherapy;Scar mobilization;Therapeutic activities;Manual Therapy;Passive range of motion;Ultrasound;Paraffin;Self-care/ADL training    Plan  Dressing change PRN, Spint(s) check and adjustments, review and upgrade HEP as appropriate L hand (Note: No A/ROM IF/LF DIP's).    Consulted and Agree with Plan of Care  Patient       Patient will benefit from skilled therapeutic intervention in order to improve the following deficits and impairments:  Decreased skin integrity, Increased edema, Impaired flexibility, Pain, Decreased coordination, Decreased mobility, Decreased scar mobility, Decreased activity tolerance, Decreased range of motion, Decreased strength, Decreased knowledge of precautions, Impaired UE functional use  Visit Diagnosis: Pain in joint of left hand  Pain in left hand  Other lack of coordination  Stiffness of left hand, not elsewhere classified    Problem List Patient Active Problem List   Diagnosis Date Noted  . Pain in right foot 03/30/2018  . Perennial and seasonal allergic rhinitis 11/09/2017  . History of food allergy 11/09/2017  . Wheezing 11/09/2017  . Dyspnea/wheezing 10/31/2017  . Cough 10/31/2017  . Bronchitis, mucopurulent recurrent (HCC) 09/07/2017  . SVT (supraventricular tachycardia) (HCC) 04/23/2015  . Abdominal pain, epigastric 06/03/2013  . Unspecified vitamin D deficiency 04/09/2013  . Esophageal reflux 04/09/2013  .  Hepatitis B carrier (HCC) 04/09/2013  . Other and unspecified hyperlipidemia 04/09/2013  . Unspecified sleep apnea 04/09/2013    Richard Walker 08/08/2018, 5:03 PM  [facility] [address] [place], [place], [address] Phone: [phone]   Fax:  [phone]  Name: Richard Walker MRN: [id number] Date of Birth: 07/15/1956  [facility] [address] [place], [place], [address] Phone: [phone]   Fax:  [phone]  Occupational Therapy Treatment  Patient Details  Name: Richard Walker MRN: [id number] Date of Birth: Jan 19, 1956 Referring Provider (OT): Richard Walker   Encounter Date: 08/08/2018  OT End of Session - 08/08/18 1310    Visit Number  2    Number of Visits  4    Date for OT Re-Evaluation  10/18/18    Authorization Type  3 visits approved through 12/30 /19    Authorization - Visit Number  1    Authorization - Number of Visits  3    OT Start Time  1147   3 units only   OT Stop Time  1240    OT Time Calculation (min)  53 min    Activity Tolerance  Patient tolerated treatment well;Patient limited by pain    Behavior During Therapy  [facility] for tasks assessed/performed;Anxious       Past Medical History:  Diagnosis Date  . DDD (degenerative disc disease), lumbar    L3/L4  . Diverticulosis   . GERD (gastroesophageal reflux disease)   . Hyperlipidemia   . Low sperm motility   . Pre-diabetes   . Sleep apnea    uses cpap   . SVT (supraventricular tachycardia) (HCC)     Past Surgical History:  Procedure Laterality Date  . 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 REPAIR OF LEFT INDEX AND LONG FINGER BY FUSION /  REPAIR OF RING FINGER;  Surgeon: Richard Hook, MD;  Location: [facility] OR;  Service: Orthopedics;  Laterality: Left;  . BACK SURGERY    . CARDIAC CATHETERIZATION N/A 12/22/2015   Procedure: Left Heart Cath and Coronary Angiography;  Surgeon: Richard M Swaziland, MD;  Location: [facility] INVASIVE CV LAB;  Service: Cardiovascular;  Laterality: N/A;  . CARDIAC ELECTROPHYSIOLOGY STUDY AND ABLATION    . ELECTROPHYSIOLOGIC STUDY N/A 02/19/2016   Procedure: SVT Ablation;  Surgeon: Richard Maw, MD;  Location: [facility] INVASIVE CV LAB;  Service: Cardiovascular;  Laterality: N/A;  . septal reconstruction  2017  . TURBINATE REDUCTION Bilateral 07/2016   . WISDOM TOOTH EXTRACTION      There were no vitals filed for this visit.  Subjective Assessment - 08/08/18 1309    Subjective   pt arrived unprotected reporting that MD had removed dressing and splints    Patient Stated Goals  Decreased pain, get use of hand back    Currently in Pain?  Yes    Pain Score  5     Pain Location  Hand    Pain Orientation  Left    Pain Descriptors / Indicators  Aching;Sore    Pain Type  Acute pain;Surgical pain    Pain Onset  1 to 4 weeks ago    Pain Frequency  Constant    Aggravating Factors   movement/ touch    Pain Relieving Factors  not moving hand             Treatment: Pt arrived unprotected stating that when he saw Richard Walker for follow up, that Richard. Janee Walker removed the dressings and splints and told him he did not need anything on his hand. Therapist called MD office for clarification, per Richard Walker assistant Richard Walker, pt should be wearing fingertip splints to immobilize DIP joints of index and middle fingers. (Pt has been unprotected since his visit with Richard. Janee Walker) Pt's hand was cleaned with soap and water and  [facility] [address] [place], [place], [address] Phone: [phone]   Fax:  [phone]  Occupational Therapy Treatment  Patient Details  Name: Richard Walker MRN: [id number] Date of Birth: Jan 19, 1956 Referring Provider (OT): Richard Walker   Encounter Date: 08/08/2018  OT End of Session - 08/08/18 1310    Visit Number  2    Number of Visits  4    Date for OT Re-Evaluation  10/18/18    Authorization Type  3 visits approved through 12/30 /19    Authorization - Visit Number  1    Authorization - Number of Visits  3    OT Start Time  1147   3 units only   OT Stop Time  1240    OT Time Calculation (min)  53 min    Activity Tolerance  Patient tolerated treatment well;Patient limited by pain    Behavior During Therapy  [facility] for tasks assessed/performed;Anxious       Past Medical History:  Diagnosis Date  . DDD (degenerative disc disease), lumbar    L3/L4  . Diverticulosis   . GERD (gastroesophageal reflux disease)   . Hyperlipidemia   . Low sperm motility   . Pre-diabetes   . Sleep apnea    uses cpap   . SVT (supraventricular tachycardia) (HCC)     Past Surgical History:  Procedure Laterality Date  . 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 REPAIR OF LEFT INDEX AND LONG FINGER BY FUSION /  REPAIR OF RING FINGER;  Surgeon: Richard Hook, MD;  Location: [facility] OR;  Service: Orthopedics;  Laterality: Left;  . BACK SURGERY    . CARDIAC CATHETERIZATION N/A 12/22/2015   Procedure: Left Heart Cath and Coronary Angiography;  Surgeon: Richard M Swaziland, MD;  Location: [facility] INVASIVE CV LAB;  Service: Cardiovascular;  Laterality: N/A;  . CARDIAC ELECTROPHYSIOLOGY STUDY AND ABLATION    . ELECTROPHYSIOLOGIC STUDY N/A 02/19/2016   Procedure: SVT Ablation;  Surgeon: Richard Maw, MD;  Location: [facility] INVASIVE CV LAB;  Service: Cardiovascular;  Laterality: N/A;  . septal reconstruction  2017  . TURBINATE REDUCTION Bilateral 07/2016   . WISDOM TOOTH EXTRACTION      There were no vitals filed for this visit.  Subjective Assessment - 08/08/18 1309    Subjective   pt arrived unprotected reporting that MD had removed dressing and splints    Patient Stated Goals  Decreased pain, get use of hand back    Currently in Pain?  Yes    Pain Score  5     Pain Location  Hand    Pain Orientation  Left    Pain Descriptors / Indicators  Aching;Sore    Pain Type  Acute pain;Surgical pain    Pain Onset  1 to 4 weeks ago    Pain Frequency  Constant    Aggravating Factors   movement/ touch    Pain Relieving Factors  not moving hand             Treatment: Pt arrived unprotected stating that when he saw Richard Walker for follow up, that Richard. Janee Walker removed the dressings and splints and told him he did not need anything on his hand. Therapist called MD office for clarification, per Richard Walker assistant Richard Walker, pt should be wearing fingertip splints to immobilize DIP joints of index and middle fingers. (Pt has been unprotected since his visit with Richard. Janee Walker) Pt's hand was cleaned with soap and water and

## 2018-08-08 NOTE — Patient Instructions (Addendum)
Your Splint This splint should initially be fitted by a healthcare practitioner.  The healthcare practitioner is responsible for providing wearing instructions and precautions to the patient, other healthcare practitioners and care provider involved in the patient's care.  This splint was custom made for you. Please read the following instructions to learn about wearing and caring for your splint.  Precautions Should your splint cause any of the following problems, remove the splint immediately and contact your therapist/physician.  Swelling  Severe Pain  Pressure Areas  Stiffness  Numbness  Do not wear your splint while operating machinery unless it has been fabricated for that purpose.  When To Wear Your Splint Where your splint according to your therapist/physician instructions. Remove splints for cleaning hand. All the time except for washing your hand, do not bend the tip joints of index and middle fingers while washing. Wash hand  with soap and water, dry thoroughly afterwards. You can clean your middle finger with saline, or use saline to help to remove a dressing that has adhered to finger. Make sure dry hand thoroughly before applying dressing and splints. Apply a small amount of xerofrom to the open area of middle finger, then top with gauze stockinette. Continue to bend digits at middle finger joint and big knuckle. Do not bend tip joints of index and middle finger. Care and Cleaning of Your Splint 1. Keep your splint away from open flames. 2. Your splint will lose its shape in temperatures over 135 degrees Farenheit, ( in car windows, near radiators, ovens or in hot water).  Never make any adjustments to your splint, if the splint needs adjusting remove it and make an appointment to see your therapist.

## 2018-08-10 ENCOUNTER — Ambulatory Visit: Payer: Medicaid Other | Admitting: Occupational Therapy

## 2018-08-10 DIAGNOSIS — R278 Other lack of coordination: Secondary | ICD-10-CM

## 2018-08-10 DIAGNOSIS — M25542 Pain in joints of left hand: Secondary | ICD-10-CM

## 2018-08-10 DIAGNOSIS — M79642 Pain in left hand: Secondary | ICD-10-CM

## 2018-08-10 DIAGNOSIS — M25642 Stiffness of left hand, not elsewhere classified: Secondary | ICD-10-CM

## 2018-08-10 NOTE — Therapy (Signed)
[facility] [address] [place], [place], [address] Phone: [phone]   Fax:  [phone]  Occupational Therapy Treatment  Patient Details  Name: Richard Walker MRN: [id number] Date of Birth: 1956/02/13 Referring Provider (OT): Dr Grandville Silos   Encounter Date: 08/10/2018  OT End of Session - 08/10/18 1153    Visit Number  3    Number of Visits  4    Date for OT Re-Evaluation  10/18/18    Authorization Type  3 visits approved through 12/30 /19    Authorization - Visit Number  2    Authorization - Number of Visits  3    OT Start Time  9407    OT Stop Time  1145    OT Time Calculation (min)  40 min    Activity Tolerance  Patient tolerated treatment well    Behavior During Therapy  [facility] for tasks assessed/performed;Anxious       Past Medical History:  Diagnosis Date  . DDD (degenerative disc disease), lumbar    L3/L4  . Diverticulosis   . GERD (gastroesophageal reflux disease)   . Hyperlipidemia   . Low sperm motility   . Pre-diabetes   . Sleep apnea    uses cpap   . SVT (supraventricular tachycardia) (HCC)     Past Surgical History:  Procedure Laterality Date  . 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 REPAIR OF LEFT INDEX AND LONG FINGER BY FUSION /  REPAIR OF RING FINGER;  Surgeon: Milly Jakob, MD;  Location: [facility];  Service: Orthopedics;  Laterality: Left;  . BACK SURGERY    . CARDIAC CATHETERIZATION N/A 12/22/2015   Procedure: Left Heart Cath and Coronary Angiography;  Surgeon: Peter M Martinique, MD;  Location: [place] CV LAB;  Service: Cardiovascular;  Laterality: N/A;  . CARDIAC ELECTROPHYSIOLOGY STUDY AND ABLATION    . ELECTROPHYSIOLOGIC STUDY N/A 02/19/2016   Procedure: SVT Ablation;  Surgeon: Evans Lance, MD;  Location: [place] CV LAB;  Service: Cardiovascular;  Laterality: N/A;  . septal reconstruction  2017  . TURBINATE REDUCTION Bilateral 07/2016  . WISDOM TOOTH EXTRACTION       There were no vitals filed for this visit.  Subjective Assessment - 08/10/18 1130    Pertinent History My middle finger is still very sensitive   Patient Stated Goals  Decreased pain, get use of hand back    Currently in Pain?  Yes    Pain Score  5    up to 8/10 w/ movement   Pain Location  Hand   mostly long finger   Pain Orientation  Left    Pain Descriptors / Indicators  Burning;Sore;Shooting    Pain Type  Acute pain;Surgical pain    Pain Onset  1 to 4 weeks ago    Aggravating Factors   movement    Pain Relieving Factors  rest      Performed dressing changes and reviewed importance of letting skin get air at least 15 minutes 2x/day. Also discussed d/c xeroform as soon as small open wound closes on dorsal side of Lt long finger.   Continued A/ROM and P/ROM to MP joints and PIP joints as tolerated w/ pre-fab mallet finger splints on index and long fingers Lt hand. Pt unable to tolerate passive flexion at long finger PIP joint.                      OT Education - 08/10/18 1135  Education Details  Isolated PIP flexion ex (with mallet finger splints on)    Person(s) Educated  Patient    Methods  Explanation;Demonstration;Handout    Comprehension  Verbalized understanding;Returned demonstration       OT Short Term Goals - 08/10/18 1153      OT SHORT TERM GOAL #1   Title  Pt will be Mod I splinting use, care and precautions    Baseline  vc's required and min assist to apply on fingers    Time  4    Period  Weeks    Status  Achieved      OT SHORT TERM GOAL #2   Title  Pt will be Mod I initial HEP Left hand/fingers    Baseline  Min VC's and tc's required    Time  4    Period  Weeks    Status  Achieved      OT SHORT TERM GOAL #3   Title  Pt will indepdendently verbalize dressing changes &  possible signs and symptoms of infection    Baseline  Min VC's required    Time  4    Period  Weeks    Status  Achieved      OT SHORT TERM GOAL #4   Title   Pt will be Mod I edema control techniques LUE    Baseline  required Min vc's to implement    Time  4    Period  Weeks    Status  Achieved        OT Long Term Goals - 07/26/18 1323      OT LONG TERM GOAL #1   Title  Pt will be Mod I updated HEP L hand/fingers    Baseline  dependent    Time  8    Period  Weeks    Status  New    Target Date  09/20/18      OT LONG TERM GOAL #2   Title  Pt will demonstrate functional A/ROM L hand as seen by ability to make flat fist    Baseline  dependent    Time  8    Period  Weeks    Status  New    Target Date  09/20/18      OT LONG TERM GOAL #3   Title  Pt will be Mod I scar management L hand/fingers    Baseline  dependent    Time  8    Period  Weeks    Status  New    Target Date  09/20/18      OT LONG TERM GOAL #4   Title  Pt will report use of left hand for light household duties as non-dominant hand    Baseline  unable     Time  8    Period  Weeks    Status  New    Target Date  09/20/18      OT LONG TERM GOAL #5   Title  Pt will report pain left hand as 3/10 or less during ADL's and light functional activities in household setting.    Baseline  07/26/18 pain = 6/10    Time  8    Period  Weeks    Status  New    Target Date  09/20/18            Plan - 08/10/18 1154    Clinical Impression Statement  Pt has met all STG's. Pt still very stiff with  PIP motion left long finger and moderately stiff Lt index finger.     Occupational performance deficits (Please refer to evaluation for details):  ADL's;Rest and Sleep;Work;Leisure;IADL's    Rehab Potential  Good    Current Impairments/barriers affecting progress:  Pain and open wounds L index, long and ring fingers    OT Frequency  --   3 visits    OT Duration  2 weeks   for 2019, followed by 1x/wk for 3 weeks beginning Jan 2020   OT Treatment/Interventions  Therapeutic exercise;Splinting;Patient/family education;Fluidtherapy;Scar mobilization;Therapeutic activities;Manual  Therapy;Passive range of motion;Ultrasound;Paraffin;Self-care/ADL training    Plan  dressing changes prn, assess PIP motion Lt index and long finger, continue ROM as able    Consulted and Agree with Plan of Care  Patient       Patient will benefit from skilled therapeutic intervention in order to improve the following deficits and impairments:  Decreased skin integrity, Increased edema, Impaired flexibility, Pain, Decreased coordination, Decreased mobility, Decreased scar mobility, Decreased activity tolerance, Decreased range of motion, Decreased strength, Decreased knowledge of precautions, Impaired UE functional use  Visit Diagnosis: Pain in left hand  Stiffness of left hand, not elsewhere classified  Pain in joint of left hand  Other lack of coordination    Problem List Patient Active Problem List   Diagnosis Date Noted  . Pain in right foot 03/30/2018  . Perennial and seasonal allergic rhinitis 11/09/2017  . History of food allergy 11/09/2017  . Wheezing 11/09/2017  . Dyspnea/wheezing 10/31/2017  . Cough 10/31/2017  . Bronchitis, mucopurulent recurrent ([place]) 09/07/2017  . SVT (supraventricular tachycardia) ([place]) 04/23/2015  . Abdominal pain, epigastric 06/03/2013  . Unspecified vitamin D deficiency 04/09/2013  . Esophageal reflux 04/09/2013  . Hepatitis B carrier ([place]) 04/09/2013  . Other and unspecified hyperlipidemia 04/09/2013  . Unspecified sleep apnea 04/09/2013    Carey Bullocks, OTR/L 08/10/2018, 12:00 PM  [facility] [address] [place], [place], [address] Phone: [phone]   Fax:  [phone]  Name: Richard Walker MRN: [id number] Date of Birth: 01-02-1956

## 2018-08-10 NOTE — Patient Instructions (Signed)
AROM: PIP Flexion / Extension     WEAR FINGER SPLINTS WHEN DOING ABOVE EXERCISE Pinch bottom knuckle of ___index, long_____ finger of right hand to prevent bending. Actively bend middle knuckle until stretch is felt. Hold _5___ seconds. Relax. Straighten finger as far as possible. Repeat _15___ times per set. Do _6___ sessions per day.

## 2018-08-17 ENCOUNTER — Encounter: Payer: Self-pay | Admitting: Family

## 2018-08-17 ENCOUNTER — Ambulatory Visit (INDEPENDENT_AMBULATORY_CARE_PROVIDER_SITE_OTHER): Payer: Medicaid Other | Admitting: Family

## 2018-08-17 VITALS — BP 124/80 | HR 84 | Temp 98.2°F | Ht 68.0 in | Wt 253.0 lb

## 2018-08-17 DIAGNOSIS — J019 Acute sinusitis, unspecified: Secondary | ICD-10-CM

## 2018-08-17 DIAGNOSIS — R062 Wheezing: Secondary | ICD-10-CM

## 2018-08-17 DIAGNOSIS — K219 Gastro-esophageal reflux disease without esophagitis: Secondary | ICD-10-CM | POA: Diagnosis not present

## 2018-08-17 DIAGNOSIS — H1032 Unspecified acute conjunctivitis, left eye: Secondary | ICD-10-CM | POA: Diagnosis not present

## 2018-08-17 MED ORDER — FLUTICASONE PROPIONATE 50 MCG/ACT NA SUSP: 1.0000 | Freq: Every day | NASAL | 0 refills | Status: DC

## 2018-08-17 MED ORDER — PANTOPRAZOLE SODIUM 40 MG PO TBEC: 40.0000 mg | DELAYED_RELEASE_TABLET | Freq: Two times a day (BID) | ORAL | 0 refills | Status: DC

## 2018-08-17 MED ORDER — AMOXICILLIN-POT CLAVULANATE 875-125 MG PO TABS: 1.0000 | ORAL_TABLET | Freq: Two times a day (BID) | ORAL | 0 refills | Status: DC

## 2018-08-17 MED ORDER — BUDESONIDE-FORMOTEROL FUMARATE 160-4.5 MCG/ACT IN AERO: 2.0000 | INHALATION_SPRAY | Freq: Two times a day (BID) | RESPIRATORY_TRACT | 0 refills | Status: DC

## 2018-08-17 NOTE — Progress Notes (Signed)
Richard Walker is a 62 y.o. male with the following history as recorded in Epic[employer]:  Patient Active Problem List   Diagnosis Date Noted  . Pain in right foot 03/30/2018  . Perennial and seasonal allergic rhinitis 11/09/2017  . History of food allergy 11/09/2017  . Wheezing 11/09/2017  . Dyspnea/wheezing 10/31/2017  . Cough 10/31/2017  . Bronchitis, mucopurulent recurrent (HCC) 09/07/2017  . SVT (supraventricular tachycardia) (HCC) 04/23/2015  . Abdominal pain, epigastric 06/03/2013  . Unspecified vitamin D deficiency 04/09/2013  . Esophageal reflux 04/09/2013  . Hepatitis B carrier (HCC) 04/09/2013  . Other and unspecified hyperlipidemia 04/09/2013  . Unspecified sleep apnea 04/09/2013    Current Outpatient Medications  Medication Sig Dispense Refill  . acetaminophen (TYLENOL) 325 MG tablet Take 2 tablets (650 mg total) by mouth every 6 (six) hours.    [name] albuterol (PROAIR HFA) 108 (90 Base) MCG/ACT inhaler Inhale 1-2 puffs into the lungs every 6 (six) hours as needed for wheezing or shortness of breath. 1 Inhaler 0  . amoxicillin-clavulanate (AUGMENTIN) 875-125 MG tablet Take 1 tablet by mouth 2 (two) times daily. 20 tablet 0  . aspirin 81 MG tablet Take 81 mg by mouth daily.    [name] azelastine (ASTELIN) 0.1 % nasal spray 1-2 sprays per nostril 2 times daily as needed. 30 mL 5  . budesonide-formoterol (SYMBICORT) 160-4.5 MCG/ACT inhaler Inhale 2 puffs into the lungs 2 (two) times daily. 1 Inhaler 0  . ezetimibe (ZETIA) 10 MG tablet Take 1 tablet (10 mg total) by mouth daily. 30 tablet 11  . famotidine-calcium carbonate-magnesium hydroxide (PEPCID COMPLETE) 10-800-165 MG chewable tablet Chew by mouth.    . flecainide (TAMBOCOR) 50 MG tablet TAKE 1 TABLET BY MOUTH AS NEEDED FOR PALPITATIONS DESPITE TAKING METOPROLOL. NOT TO EXCEED 6 TABLETS IN 24 HOURS 45 tablet 3  . fluticasone (FLONASE) 50 MCG/ACT nasal spray Place 1 spray into both nostrils daily. 16 g 0  . HYDROcodone-acetaminophen  (NORCO) 5-325 MG tablet Take 1 tablet by mouth every 6 (six) hours as needed for moderate pain. 6 tablet 0  . HYDROcodone-acetaminophen (NORCO/VICODIN) 5-325 MG tablet Take 1-2 tablets by mouth every 6 (six) hours as needed for moderate pain or severe pain. 20 tablet 0  . ibuprofen (ADVIL) 200 MG tablet Take 3 tablets (600 mg total) by mouth every 6 (six) hours.    [name] levocetirizine (XYZAL) 5 MG tablet TAKE 1 TABLET(5 MG) BY MOUTH EVERY EVENING 90 tablet 0  . meloxicam (MOBIC) 15 MG tablet TK 1 T PO QD  1  . metFORMIN (GLUCOPHAGE) 500 MG tablet Take 1 tablet (500 mg total) by mouth 2 (two) times daily with a meal. 60 tablet 3  . metoprolol tartrate (LOPRESSOR) 25 MG tablet TAKE 1 TABLET(25 MG) BY MOUTH TWICE DAILY (Patient taking differently: No sig reported) 180 tablet 2  . montelukast (SINGULAIR) 10 MG tablet Take 1 tablet once at night for coughing or wheezing. 30 tablet 1  . pantoprazole (PROTONIX) 40 MG tablet Take 1 tablet (40 mg total) by mouth 2 (two) times daily. 60 tablet 0  . PRESCRIPTION MEDICATION Inhale into the lungs at bedtime. CPAP    . sildenafil (REVATIO) 20 MG tablet Take 1-2 tablets (20-40 mg total) by mouth as needed. 60 tablet 1  . Vitamin D, Ergocalciferol, (DRISDOL) 1.25 MG (50000 UT) CAPS capsule Take 1 capsule (50,000 Units total) by mouth every 7 (seven) days. 4 capsule 0   No current facility-administered medications for this visit.  Allergies: Tree extract; Pork-derived products; and Statins  Past Medical History:  Diagnosis Date  . DDD (degenerative disc disease), lumbar    L3/L4  . Diverticulosis   . GERD (gastroesophageal reflux disease)   . Hyperlipidemia   . Low sperm motility   . Pre-diabetes   . Sleep apnea    uses cpap   . SVT (supraventricular tachycardia) (HCC)     Past Surgical History:  Procedure Laterality Date  . 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 REPAIR OF LEFT INDEX AND LONG FINGER BY FUSION /  REPAIR OF RING FINGER;  Surgeon:  Mack Hookhompson, David, MD;  [facility];  Service: Orthopedics;  Laterality: Left;  . BACK SURGERY    . CARDIAC CATHETERIZATION N/A 12/22/2015   Procedure: Left Heart Cath and Coronary Angiography;  Surgeon: Peter M SwazilandJordan, MD;  [facility];  Service: Cardiovascular;  Laterality: N/A;  . CARDIAC ELECTROPHYSIOLOGY STUDY AND ABLATION    . ELECTROPHYSIOLOGIC STUDY N/A 02/19/2016   Procedure: SVT Ablation;  Surgeon: Marinus MawGregg W Taylor, MD;  [facility];  Service: Cardiovascular;  Laterality: N/A;  . septal reconstruction  2017  . TURBINATE REDUCTION Bilateral 07/2016  . WISDOM TOOTH EXTRACTION      Family History  Problem Relation Age of Onset  . Hypertension Father   . Food Allergy Son   . Colon cancer Neg Hx   . Stomach cancer Neg Hx   . Allergic rhinitis Neg Hx   . Angioedema Neg Hx   . Asthma Neg Hx   . Eczema Neg Hx   . Immunodeficiency Neg Hx   . Urticaria Neg Hx     Social History   Tobacco Use  . Smoking status: Former Smoker    Packs/day: 1.00    Years: 10.00    Pack years: 10.00    Types: Cigarettes    Last attempt to quit: 1992    Years since quitting: 28.0  . Smokeless tobacco: Never Used  . Tobacco comment: Uses [employer] Pipe occasionally  Substance Use Topics  . Alcohol use: No    Subjective:  Patient presents with concerns for cough/ head congestion; notes that left eye has been draining; notes left eye has actually been matted shut the past 2 days; + productive cough; patient is unsure of his medications- not sure if he is taking the Symbicort listed on his medication list regularly; does hear himself wheezing especially this morning- feels that wheezing has improved as the day has progressed; no fever, no shortness of breath, no chest pain; Also requesting refill on the Protonix he uses for his reflux; has been trying OTC medications but not controlling his symptoms; has seen GI previously.      Objective:  Vitals:   08/17/18 1426   BP: 124/80  Pulse: 84  Temp: 98.2 F (36.8 C)  TempSrc: Oral  SpO2: 96%  Weight: 253 lb 0.6 oz (114.8 kg)  Height: 5\' 8"  (1.727 m)    General: Well developed, well nourished, in no acute distress  Skin : Warm and dry.  Head: Normocephalic and atraumatic  Eyes: Left Sclera and conjunctiva erythematous; pupils round and reactive to light; extraocular movements intact  Ears: External normal; canals clear; tympanic membranes normal  Oropharynx: Pink, supple. No suspicious lesions  Neck: Supple without thyromegaly, adenopathy  Lungs: Respirations unlabored; clear to auscultation bilaterally without wheeze, rales, rhonchi  CVS exam: normal rate and regular rhythm.  Neurologic: Alert and oriented; speech intact; face  symmetrical; moves all extremities well; CNII-XII intact without focal deficit   Assessment:  1. Acute sinusitis, recurrence not specified, unspecified location   2. Acute bacterial conjunctivitis of left eye   3. Wheezing   4. Gastroesophageal reflux disease without esophagitis     Plan:  1. Rx for Augmentin 875 mg bid x 10 days; stressed to patient to use his Flonase regularly; continue his Astelin as well; 3. Refill given on Symbicort- stressed to use 2 puffs bid as prescribed; physical exam was reassuring today however; further refills need to come from his PCP. 4. Short-term refill on Protonix; further refills need to come from his PCP or GI.   No follow-ups on file.  No orders of the defined types were placed in this encounter.   Requested Prescriptions   Signed Prescriptions Disp Refills  . pantoprazole (PROTONIX) 40 MG tablet 60 tablet 0    Sig: Take 1 tablet (40 mg total) by mouth 2 (two) times daily.  . budesonide-formoterol (SYMBICORT) 160-4.5 MCG/ACT inhaler 1 Inhaler 0    Sig: Inhale 2 puffs into the lungs 2 (two) times daily.  [name]. amoxicillin-clavulanate (AUGMENTIN) 875-125 MG tablet 20 tablet 0    Sig: Take 1 tablet by mouth 2 (two) times daily.  .  fluticasone (FLONASE) 50 MCG/ACT nasal spray 16 g 0    Sig: Place 1 spray into both nostrils daily.

## 2018-08-18 ENCOUNTER — Ambulatory Visit: Payer: Medicaid Other | Admitting: Occupational Therapy

## 2018-08-18 DIAGNOSIS — M25542 Pain in joints of left hand: Secondary | ICD-10-CM | POA: Diagnosis not present

## 2018-08-18 DIAGNOSIS — M79642 Pain in left hand: Secondary | ICD-10-CM

## 2018-08-18 DIAGNOSIS — M25642 Stiffness of left hand, not elsewhere classified: Secondary | ICD-10-CM

## 2018-08-18 NOTE — Therapy (Signed)
[facility] [address] [place], [place], [address] Phone: [phone]   Fax:  [phone]  Occupational Therapy Treatment  Patient Details  Name: Richard AmesSam F Alarie MRN: [id number] Date of Birth: [birth year] Referring Provider (OT): Dr Janee Mornhompson   Encounter Date: 08/18/2018  OT End of Session - 08/18/18 1157    Visit Number  4    Number of Visits  4    Date for OT Re-Evaluation  10/18/18    Authorization Type  3 visits approved through 12/30 /19,  3 visits approved 08/24/18 - 09/13/18    Authorization - Visit Number  3    Authorization - Number of Visits  3    OT Start Time  1100    OT Stop Time  1140    OT Time Calculation (min)  40 min    Activity Tolerance  Patient limited by pain    Behavior During Therapy  Anxious       Past Medical History:  Diagnosis Date  . DDD (degenerative disc disease), lumbar    L3/L4  . Diverticulosis   . GERD (gastroesophageal reflux disease)   . Hyperlipidemia   . Low sperm motility   . Pre-diabetes   . Sleep apnea    uses cpap   . SVT (supraventricular tachycardia) (HCC)     Past Surgical History:  Procedure Laterality Date  . 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 REPAIR OF LEFT INDEX AND LONG FINGER BY FUSION /  REPAIR OF RING FINGER;  Surgeon: Mack Hookhompson, David, MD;  [facility];  Service: Orthopedics;  Laterality: Left;  . BACK SURGERY    . CARDIAC CATHETERIZATION N/A 12/22/2015   Procedure: Left Heart Cath and Coronary Angiography;  Surgeon: Peter M SwazilandJordan, MD;  [facility];  Service: Cardiovascular;  Laterality: N/A;  . CARDIAC ELECTROPHYSIOLOGY STUDY AND ABLATION    . ELECTROPHYSIOLOGIC STUDY N/A 02/19/2016   Procedure: SVT Ablation;  Surgeon: Marinus MawGregg W Taylor, MD;  [facility];  Service: Cardiovascular;  Laterality: N/A;  . septal reconstruction  2017  . TURBINATE REDUCTION Bilateral 07/2016  . WISDOM TOOTH EXTRACTION      There  were no vitals filed for this visit.  Subjective Assessment - 08/18/18 1108    Subjective   It is so sensitive (Lt long finger)     Pertinent History  surgery 07/15/18    Patient Stated Goals  Decreased pain, get use of hand back    Currently in Pain?  Yes    Pain Score  8     Pain Location  Finger (Comment which one)   middle   Pain Orientation  Left    Pain Descriptors / Indicators  Burning;Shooting    Pain Type  Acute pain;Surgical pain    Pain Onset  More than a month ago    Pain Frequency  Constant    Aggravating Factors   movement    Pain Relieving Factors  rest         [facility] OT Assessment - 08/18/18 0001      Left Hand AROM   L Index PIP 0-100  50 Degrees   75* after stretching   L Long PIP 0-100  18 Degrees   30* after stretching   L Ring PIP 0-100  85 Degrees               OT Treatments/Exercises (OP) - 08/18/18 0001      ADLs  8    Period  Weeks    Status  New    Target Date  09/20/18      OT LONG TERM GOAL #4   Title  Pt will report use of left hand for light household duties as non-dominant hand    Baseline  unable     Time  8    Period  Weeks    Status  New    Target Date  09/20/18      OT LONG TERM GOAL #5   Title  Pt will report pain left hand as 3/10 or less during ADL's and light functional activities in household setting.    Baseline  07/26/18 pain  = 6/10    Time  8    Period  Weeks    Status  New    Target Date  09/20/18            Plan - 08/18/18 1158    Clinical Impression Statement  Pt still very stiff with PIP motion left long finger and moderately stiff Lt index finger. See ROM measurements    Occupational performance deficits (Please refer to evaluation for details):  ADL's;Rest and Sleep;Work;Leisure;IADL's    Rehab Potential  Good    Current Impairments/barriers affecting progress:  Pain and open wounds L index, long and ring fingers    OT Frequency  1x / week    OT Duration  --   3 weeks (beginning Jan 2020)   OT Treatment/Interventions  Therapeutic exercise;Splinting;Patient/family education;Fluidtherapy;Scar mobilization;Therapeutic activities;Manual Therapy;Passive range of motion;Ultrasound;Paraffin;Self-care/ADL training    Plan  continue ROM Lt index, long and ring fingers as able at PIP joints/MP joints. Pt sees Dr. Janee Mornhompson 08/30/18    Consulted and Agree with Plan of Care  Patient       Patient will benefit from skilled therapeutic intervention in order to improve the following deficits and impairments:  Decreased skin integrity, Increased edema, Impaired flexibility, Pain, Decreased coordination, Decreased mobility, Decreased scar mobility, Decreased activity tolerance, Decreased range of motion, Decreased strength, Decreased knowledge of precautions, Impaired UE functional use  Visit Diagnosis: Pain in left hand  Stiffness of left hand, not elsewhere classified  Pain in joint of left hand    Problem List Patient Active Problem List   Diagnosis Date Noted  . Pain in right foot 03/30/2018  . Perennial and seasonal allergic rhinitis 11/09/2017  . History of food allergy 11/09/2017  . Wheezing 11/09/2017  . Dyspnea/wheezing 10/31/2017  . Cough 10/31/2017  . Bronchitis, mucopurulent recurrent (HCC) 09/07/2017  . SVT (supraventricular tachycardia) (HCC) 04/23/2015  . Abdominal pain, epigastric  06/03/2013  . Unspecified vitamin D deficiency 04/09/2013  . Esophageal reflux 04/09/2013  . Hepatitis B carrier (HCC) 04/09/2013  . Other and unspecified hyperlipidemia 04/09/2013  . Unspecified sleep apnea 04/09/2013    Kelli ChurnBallie, Rahn Lacuesta Johnson, OTR/L 08/18/2018, 12:06 PM  [facility] [address] [place], [place], [address] Phone: [phone]   Fax:  [phone]  Name: Richard AmesSam F Walker MRN: [id number] Date of Birth: 02-14-1956  8    Period  Weeks    Status  New    Target Date  09/20/18      OT LONG TERM GOAL #4   Title  Pt will report use of left hand for light household duties as non-dominant hand    Baseline  unable     Time  8    Period  Weeks    Status  New    Target Date  09/20/18      OT LONG TERM GOAL #5   Title  Pt will report pain left hand as 3/10 or less during ADL's and light functional activities in household setting.    Baseline  07/26/18 pain  = 6/10    Time  8    Period  Weeks    Status  New    Target Date  09/20/18            Plan - 08/18/18 1158    Clinical Impression Statement  Pt still very stiff with PIP motion left long finger and moderately stiff Lt index finger. See ROM measurements    Occupational performance deficits (Please refer to evaluation for details):  ADL's;Rest and Sleep;Work;Leisure;IADL's    Rehab Potential  Good    Current Impairments/barriers affecting progress:  Pain and open wounds L index, long and ring fingers    OT Frequency  1x / week    OT Duration  --   3 weeks (beginning Jan 2020)   OT Treatment/Interventions  Therapeutic exercise;Splinting;Patient/family education;Fluidtherapy;Scar mobilization;Therapeutic activities;Manual Therapy;Passive range of motion;Ultrasound;Paraffin;Self-care/ADL training    Plan  continue ROM Lt index, long and ring fingers as able at PIP joints/MP joints. Pt sees Dr. Janee Mornhompson 08/30/18    Consulted and Agree with Plan of Care  Patient       Patient will benefit from skilled therapeutic intervention in order to improve the following deficits and impairments:  Decreased skin integrity, Increased edema, Impaired flexibility, Pain, Decreased coordination, Decreased mobility, Decreased scar mobility, Decreased activity tolerance, Decreased range of motion, Decreased strength, Decreased knowledge of precautions, Impaired UE functional use  Visit Diagnosis: Pain in left hand  Stiffness of left hand, not elsewhere classified  Pain in joint of left hand    Problem List Patient Active Problem List   Diagnosis Date Noted  . Pain in right foot 03/30/2018  . Perennial and seasonal allergic rhinitis 11/09/2017  . History of food allergy 11/09/2017  . Wheezing 11/09/2017  . Dyspnea/wheezing 10/31/2017  . Cough 10/31/2017  . Bronchitis, mucopurulent recurrent (HCC) 09/07/2017  . SVT (supraventricular tachycardia) (HCC) 04/23/2015  . Abdominal pain, epigastric  06/03/2013  . Unspecified vitamin D deficiency 04/09/2013  . Esophageal reflux 04/09/2013  . Hepatitis B carrier (HCC) 04/09/2013  . Other and unspecified hyperlipidemia 04/09/2013  . Unspecified sleep apnea 04/09/2013    Kelli ChurnBallie, Rahn Lacuesta Johnson, OTR/L 08/18/2018, 12:06 PM  [facility] [address] [place], [place], [address] Phone: [phone]   Fax:  [phone]  Name: Richard AmesSam F Walker MRN: [id number] Date of Birth: 02-14-1956

## 2018-08-22 ENCOUNTER — Other Ambulatory Visit (INDEPENDENT_AMBULATORY_CARE_PROVIDER_SITE_OTHER): Payer: Medicaid Other

## 2018-08-22 ENCOUNTER — Encounter: Payer: Medicaid Other | Admitting: Occupational Therapy

## 2018-08-22 DIAGNOSIS — E559 Vitamin D deficiency, unspecified: Secondary | ICD-10-CM

## 2018-08-22 DIAGNOSIS — E785 Hyperlipidemia, unspecified: Secondary | ICD-10-CM

## 2018-08-22 LAB — COMPREHENSIVE METABOLIC PANEL
ALT: 19 U/L (ref 0–53)
AST: 13 U/L (ref 0–37)
Albumin: 4.2 g/dL (ref 3.5–5.2)
Alkaline Phosphatase: 80 U/L (ref 39–117)
BUN: 18 mg/dL (ref 6–23)
CHLORIDE: 102 meq/L (ref 96–112)
CO2: 28 mEq/L (ref 19–32)
Calcium: 9.5 mg/dL (ref 8.4–10.5)
Creatinine, Ser: 1.09 mg/dL (ref 0.40–1.50)
GFR: 72.81 mL/min (ref >60.00)
Glucose, Bld: 94 mg/dL (ref 70–99)
POTASSIUM: 4.1 meq/L (ref 3.5–5.1)
Sodium: 139 mEq/L (ref 135–145)
Total Bilirubin: 0.5 mg/dL (ref 0.2–1.2)
Total Protein: 6.7 g/dL (ref 6.0–8.3)

## 2018-08-22 LAB — LIPID PANEL
CHOL/HDL RATIO: 6
Cholesterol: 211 mg/dL — ABNORMAL HIGH (ref 0–200)
HDL: 36.5 mg/dL — ABNORMAL LOW (ref >39.00)
NonHDL: 174.26
Triglycerides: 266 mg/dL — ABNORMAL HIGH (ref 0.0–149.0)
VLDL: 53.2 mg/dL — ABNORMAL HIGH (ref 0.0–40.0)

## 2018-08-22 LAB — LDL CHOLESTEROL, DIRECT: Direct LDL: 142 mg/dL

## 2018-08-22 LAB — MAGNESIUM: Magnesium: 2 mg/dL (ref 1.5–2.5)

## 2018-08-27 LAB — VITAMIN D 1,25 DIHYDROXY
Vitamin D 1, 25 (OH)2 Total: 56 pg/mL (ref 18–72)
Vitamin D2 1, 25 (OH)2: 20 pg/mL
Vitamin D3 1, 25 (OH)2: 36 pg/mL

## 2018-08-29 ENCOUNTER — Ambulatory Visit: Payer: Medicaid Other | Attending: Medical | Admitting: Occupational Therapy

## 2018-08-29 DIAGNOSIS — R278 Other lack of coordination: Secondary | ICD-10-CM | POA: Diagnosis present

## 2018-08-29 DIAGNOSIS — M25542 Pain in joints of left hand: Secondary | ICD-10-CM | POA: Diagnosis present

## 2018-08-29 DIAGNOSIS — M79642 Pain in left hand: Secondary | ICD-10-CM

## 2018-08-29 DIAGNOSIS — M25642 Stiffness of left hand, not elsewhere classified: Secondary | ICD-10-CM | POA: Insufficient documentation

## 2018-08-29 NOTE — Patient Instructions (Signed)
Dr Janee Morn,   Lanelle Bal is receiving OT at our site. His progress remains limited by significant pain particularly in PIP joint for middle finger.  A/ROM LUE: index PIP: 80, middle PIP: 55, Ring PIP 80  Please advise whether he will need to continue to wear the finger tip splints and how long?  Please advise when he will be cleared to progress to hand strengthening?  Please feel free to contact me with any questions. Sincerely,   Keene Breath, OTR/L

## 2018-08-29 NOTE — Therapy (Signed)
[facility] [address] [place], [place], [address] Phone: [phone]   Fax:  [phone]  Occupational Therapy Treatment  Patient Details  Name: Richard Walker MRN: [id number] Date of Birth: 05/01/1956 Referring Provider (OT): Dr Janee Morn   Encounter Date: 08/29/2018  OT End of Session - 08/29/18 1744    Visit Number  5    Number of Visits  7    Date for OT Re-Evaluation  10/18/18    Authorization Type  3 visits approved through 12/30 /19,  3 visits approved 08/24/18 - 09/13/18    Authorization - Visit Number  4    Authorization - Number of Visits  6    OT Start Time  1535    OT Stop Time  1615    OT Time Calculation (min)  40 min    Activity Tolerance  Patient limited by pain       Past Medical History:  Diagnosis Date  . DDD (degenerative disc disease), lumbar    L3/L4  . Diverticulosis   . GERD (gastroesophageal reflux disease)   . Hyperlipidemia   . Low sperm motility   . Pre-diabetes   . Sleep apnea    uses cpap   . SVT (supraventricular tachycardia) (HCC)     Past Surgical History:  Procedure Laterality Date  . 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 REPAIR OF LEFT INDEX AND LONG FINGER BY FUSION /  REPAIR OF RING FINGER;  Surgeon: Mack Hook, MD;  Location: [facility] OR;  Service: Orthopedics;  Laterality: Left;  . BACK SURGERY    . CARDIAC CATHETERIZATION N/A 12/22/2015   Procedure: Left Heart Cath and Coronary Angiography;  Surgeon: Peter M Swaziland, MD;  Location: [facility] INVASIVE CV LAB;  Service: Cardiovascular;  Laterality: N/A;  . CARDIAC ELECTROPHYSIOLOGY STUDY AND ABLATION    . ELECTROPHYSIOLOGIC STUDY N/A 02/19/2016   Procedure: SVT Ablation;  Surgeon: Marinus Maw, MD;  Location: [facility] INVASIVE CV LAB;  Service: Cardiovascular;  Laterality: N/A;  . septal reconstruction  2017  . TURBINATE REDUCTION Bilateral 07/2016  . WISDOM TOOTH EXTRACTION      There were no vitals filed for this  visit.  Subjective Assessment - 08/29/18 1741    Subjective   Pt reports continued pain    Pertinent History  surgery 07/15/18    Patient Stated Goals  Decreased pain, get use of hand back    Currently in Pain?  Yes    Pain Score  5     Pain Location  Finger (Comment which one)    Pain Orientation  Left    Pain Descriptors / Indicators  Burning;Shooting    Pain Type  Acute pain;Surgical pain    Pain Onset  More than a month ago    Pain Frequency  Constant    Aggravating Factors   movement    Pain Relieving Factors  rest    Multiple Pain Sites  No                Treatment: [place] , 0.8w/cm 2, 20% x 8 mins to re-application of finger tip splints along with coban.volar index, ring and middle finger PIP joints to palm for stiffness and scar mobs. New finger stockinette applied to digits followed by re-application of finger tip splints along with coban. A/ROM, AA/ROM, place and hold and P/ROM as tolerated to index, long, and ring fingers for PIP flexion while blocking MP joints in extension (and mallet finger splints on  index and long fingers).MP flex/ext x 10 - within normal ranges. Note sent to MD with pt. Regarding progress, see pt instructions. ROM taken to PIP joints. Pt wounds appear to be haeling no s/ s of infection.                    OT Short Term Goals - 08/10/18 1153      OT SHORT TERM GOAL #1   Title  Pt will be Mod I splinting use, care and precautions    Baseline  vc's required and min assist to apply on fingers    Time  4    Period  Weeks    Status  Achieved      OT SHORT TERM GOAL #2   Title  Pt will be Mod I initial HEP Left hand/fingers    Baseline  Min VC's and tc's required    Time  4    Period  Weeks    Status  Achieved      OT SHORT TERM GOAL #3   Title  Pt will indepdendently verbalize dressing changes &  possible signs and symptoms of infection    Baseline  Min VC's required    Time  4    Period  Weeks    Status  Achieved       OT SHORT TERM GOAL #4   Title  Pt will be Mod I edema control techniques LUE    Baseline  required Min vc's to implement    Time  4    Period  Weeks    Status  Achieved        OT Long Term Goals - 07/26/18 1323      OT LONG TERM GOAL #1   Title  Pt will be Mod I updated HEP L hand/fingers    Baseline  dependent    Time  8    Period  Weeks    Status  New    Target Date  09/20/18      OT LONG TERM GOAL #2   Title  Pt will demonstrate functional A/ROM L hand as seen by ability to make flat fist    Baseline  dependent    Time  8    Period  Weeks    Status  New    Target Date  09/20/18      OT LONG TERM GOAL #3   Title  Pt will be Mod I scar management L hand/fingers    Baseline  dependent    Time  8    Period  Weeks    Status  New    Target Date  09/20/18      OT LONG TERM GOAL #4   Title  Pt will report use of left hand for light household duties as non-dominant hand    Baseline  unable     Time  8    Period  Weeks    Status  New    Target Date  09/20/18      OT LONG TERM GOAL #5   Title  Pt will report pain left hand as 3/10 or less during ADL's and light functional activities in household setting.    Baseline  07/26/18 pain = 6/10    Time  8    Period  Weeks    Status  New    Target Date  09/20/18            Plan - 08/29/18  1950    Clinical Impression Statement  Pt still very stiff with PIP motion left long finger. Note sent with pt to take to MD.    Occupational performance deficits (Please refer to evaluation for details):  ADL's;Rest and Sleep;Work;Leisure;IADL's    Rehab Potential  Good    Current Impairments/barriers affecting progress:  Pain and open wounds L index, long and ring fingers    OT Frequency  1x / week    OT Duration  --   3 weeks   OT Treatment/Interventions  Therapeutic exercise;Splinting;Patient/family education;Fluidtherapy;Scar mobilization;Therapeutic activities;Manual Therapy;Passive range of  motion;Ultrasound;Paraffin;Self-care/ADL training    Plan  continue ROM Lt index, long and ring fingers as able at PIP joints/MP joints. Progress per MD orders    Consulted and Agree with Plan of Care  Patient       Patient will benefit from skilled therapeutic intervention in order to improve the following deficits and impairments:  Decreased skin integrity, Increased edema, Impaired flexibility, Pain, Decreased coordination, Decreased mobility, Decreased scar mobility, Decreased activity tolerance, Decreased range of motion, Decreased strength, Decreased knowledge of precautions, Impaired UE functional use  Visit Diagnosis: Pain in left hand  Stiffness of left hand, not elsewhere classified  Pain in joint of left hand  Other lack of coordination    Problem List Patient Active Problem List   Diagnosis Date Noted  . Pain in right foot 03/30/2018  . Perennial and seasonal allergic rhinitis 11/09/2017  . History of food allergy 11/09/2017  . Wheezing 11/09/2017  . Dyspnea/wheezing 10/31/2017  . Cough 10/31/2017  . Bronchitis, mucopurulent recurrent (HCC) 09/07/2017  . SVT (supraventricular tachycardia) (HCC) 04/23/2015  . Abdominal pain, epigastric 06/03/2013  . Unspecified vitamin D deficiency 04/09/2013  . Esophageal reflux 04/09/2013  . Hepatitis B carrier (HCC) 04/09/2013  . Other and unspecified hyperlipidemia 04/09/2013  . Unspecified sleep apnea 04/09/2013    RINE,KATHRYN 08/29/2018, 7:59 PM  [facility] [address] [place], [place], [address] Phone: [phone]   Fax:  [phone]  Name: Richard Walker MRN: [id number] Date of Birth: 11/17/1955

## 2018-09-05 ENCOUNTER — Ambulatory Visit: Payer: Medicaid Other | Admitting: Occupational Therapy

## 2018-09-05 DIAGNOSIS — M25642 Stiffness of left hand, not elsewhere classified: Secondary | ICD-10-CM

## 2018-09-05 DIAGNOSIS — R278 Other lack of coordination: Secondary | ICD-10-CM

## 2018-09-05 DIAGNOSIS — M79642 Pain in left hand: Secondary | ICD-10-CM | POA: Diagnosis not present

## 2018-09-05 DIAGNOSIS — M25542 Pain in joints of left hand: Secondary | ICD-10-CM

## 2018-09-05 NOTE — Therapy (Signed)
[facility] [address] [place], [place], [address] Phone: [phone]   Fax:  [phone]  Occupational Therapy Treatment  Patient Details  Name: Richard AmesSam F Walker MRN: [id number] Date of Birth: Jan 05, 1956 Referring Provider (OT): Dr Janee Mornhompson   Encounter Date: 09/05/2018  OT End of Session - 09/05/18 1254    Visit Number  6    Number of Visits  7    Date for OT Re-Evaluation  10/18/18    Authorization Type  3 visits approved through 12/30 /19,  3 visits approved 08/24/18 - 09/13/18    Authorization - Visit Number  5    Authorization - Number of Visits  6    OT Start Time  1147    OT Stop Time  1230    OT Time Calculation (min)  43 min    Activity Tolerance  Patient limited by pain    Behavior During Therapy  Anxious       Past Medical History:  Diagnosis Date  . DDD (degenerative disc disease), lumbar    L3/L4  . Diverticulosis   . GERD (gastroesophageal reflux disease)   . Hyperlipidemia   . Low sperm motility   . Pre-diabetes   . Sleep apnea    uses cpap   . SVT (supraventricular tachycardia) (HCC)     Past Surgical History:  Procedure Laterality Date  . 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 REPAIR OF LEFT INDEX AND LONG FINGER BY FUSION /  REPAIR OF RING FINGER;  Surgeon: Mack Hookhompson, David, MD;  [facility];  Service: Orthopedics;  Laterality: Left;  . BACK SURGERY    . CARDIAC CATHETERIZATION N/A 12/22/2015   Procedure: Left Heart Cath and Coronary Angiography;  Surgeon: Peter M SwazilandJordan, MD;  [facility];  Service: Cardiovascular;  Laterality: N/A;  . CARDIAC ELECTROPHYSIOLOGY STUDY AND ABLATION    . ELECTROPHYSIOLOGIC STUDY N/A 02/19/2016   Procedure: SVT Ablation;  Surgeon: Marinus MawGregg W Taylor, MD;  [facility];  Service: Cardiovascular;  Laterality: N/A;  . septal reconstruction  2017  . TURBINATE REDUCTION Bilateral 07/2016  . WISDOM TOOTH EXTRACTION      There were  no vitals filed for this visit.  Subjective Assessment - 09/05/18 1308    Patient Stated Goals  Decreased pain, get use of hand back    Currently in Pain?  Yes    Pain Score  5     Pain Location  Finger (Comment which one)    Pain Orientation  Left    Pain Descriptors / Indicators  Burning;Shooting    Pain Type  Acute pain    Pain Onset  More than a month ago    Aggravating Factors   movement    Pain Relieving Factors  rest     Multiple Pain Sites  No            Treatment: Pt arrived wearing finger splints. Splints were removed. Pt's wounds are closed with only 1 small scab remaining  Pt was instructed to begin desensitization, and gentle massage to fingers avoiding scab. Pt verbalized understanding. US 3 mhz, 0.8 w/cm 2, 20% x 8 mins to volar index, middle and ring fingers and palm, no adverse reactions. A/ROM and P/ROM PIP flexion blocking, AA/ROM, and P/ROM composite flexion, pt was encouraged to exercise more aggressively at home Pt's fingertips were re-dressed and splints were re applied.  OT Short Term Goals - 08/10/18 1153      OT SHORT TERM GOAL #1   Title  Pt will be Mod I splinting use, care and precautions    Baseline  vc's required and min assist to apply on fingers    Time  4    Period  Weeks    Status  Achieved      OT SHORT TERM GOAL #2   Title  Pt will be Mod I initial HEP Left hand/fingers    Baseline  Min VC's and tc's required    Time  4    Period  Weeks    Status  Achieved      OT SHORT TERM GOAL #3   Title  Pt will indepdendently verbalize dressing changes &  possible signs and symptoms of infection    Baseline  Min VC's required    Time  4    Period  Weeks    Status  Achieved      OT SHORT TERM GOAL #4   Title  Pt will be Mod I edema control techniques LUE    Baseline  required Min vc's to implement    Time  4    Period  Weeks    Status  Achieved        OT Long Term Goals - 07/26/18 1323      OT LONG  TERM GOAL #1   Title  Pt will be Mod I updated HEP L hand/fingers    Baseline  dependent    Time  8    Period  Weeks    Status  New    Target Date  09/20/18      OT LONG TERM GOAL #2   Title  Pt will demonstrate functional A/ROM L hand as seen by ability to make flat fist    Baseline  dependent    Time  8    Period  Weeks    Status  New    Target Date  09/20/18      OT LONG TERM GOAL #3   Title  Pt will be Mod I scar management L hand/fingers    Baseline  dependent    Time  8    Period  Weeks    Status  New    Target Date  09/20/18      OT LONG TERM GOAL #4   Title  Pt will report use of left hand for light household duties as non-dominant hand    Baseline  unable     Time  8    Period  Weeks    Status  New    Target Date  09/20/18      OT LONG TERM GOAL #5   Title  Pt will report pain left hand as 3/10 or less during ADL's and light functional activities in household setting.    Baseline  07/26/18 pain = 6/10    Time  8    Period  Weeks    Status  New    Target Date  09/20/18            Plan - 09/05/18 1256    Clinical Impression Statement  Pt is progressing slowly towards goals. MD responded to therapist note. MD wants pt to have full ROM before strengthening and he should continue to wear fingertip splints for protection with activity.    Occupational performance deficits (Please refer to evaluation for details):  ADL's;Rest and Sleep;Work;Leisure;IADL's  Rehab Potential  Good    Current Impairments/barriers affecting progress:  Pain and open wounds L index, long and ring fingers    OT Frequency  1x / week    OT Duration  --   3 weeks   OT Treatment/Interventions  Therapeutic exercise;Splinting;Patient/family education;Fluidtherapy;Scar mobilization;Therapeutic activities;Manual Therapy;Passive range of motion;Ultrasound;Paraffin;Self-care/ADL training    Plan   check goals and request more Medicaid visits, pt will need to shcedule more appts,continue ROM  Lt index, long and ring fingers as able at PIP joints/MP joints. Progress per MD orders    Consulted and Agree with Plan of Care  Patient       Patient will benefit from skilled therapeutic intervention in order to improve the following deficits and impairments:  Decreased skin integrity, Increased edema, Impaired flexibility, Pain, Decreased coordination, Decreased mobility, Decreased scar mobility, Decreased activity tolerance, Decreased range of motion, Decreased strength, Decreased knowledge of precautions, Impaired UE functional use  Visit Diagnosis: Pain in left hand  Stiffness of left hand, not elsewhere classified  Pain in joint of left hand  Other lack of coordination    Problem List Patient Active Problem List   Diagnosis Date Noted  . Pain in right foot 03/30/2018  . Perennial and seasonal allergic rhinitis 11/09/2017  . History of food allergy 11/09/2017  . Wheezing 11/09/2017  . Dyspnea/wheezing 10/31/2017  . Cough 10/31/2017  . Bronchitis, mucopurulent recurrent (HCC) 09/07/2017  . SVT (supraventricular tachycardia) (HCC) 04/23/2015  . Abdominal pain, epigastric 06/03/2013  . Unspecified vitamin D deficiency 04/09/2013  . Esophageal reflux 04/09/2013  . Hepatitis B carrier (HCC) 04/09/2013  . Other and unspecified hyperlipidemia 04/09/2013  . Unspecified sleep apnea 04/09/2013    , 09/05/2018, 1:10 PM Keene Breath, OTR/L [phone] Phone: [phone] 1:24 PM 09/05/18 [facility] [address] [place], [place], [address] Phone: [phone]   Fax:  [phone]  Name: TRUETT MCFARLAN MRN: [id number] Date of Birth: 02-Oct-1955

## 2018-09-12 ENCOUNTER — Ambulatory Visit: Payer: Medicaid Other | Admitting: Occupational Therapy

## 2018-09-12 DIAGNOSIS — M25542 Pain in joints of left hand: Secondary | ICD-10-CM

## 2018-09-12 DIAGNOSIS — R278 Other lack of coordination: Secondary | ICD-10-CM

## 2018-09-12 DIAGNOSIS — M25642 Stiffness of left hand, not elsewhere classified: Secondary | ICD-10-CM

## 2018-09-12 DIAGNOSIS — M79642 Pain in left hand: Secondary | ICD-10-CM

## 2018-09-12 NOTE — Therapy (Signed)
[facility] [address] [place], [place], [address] Phone: [phone]   Fax:  [phone]  Occupational Therapy Treatment  Patient Details  Name: Richard Walker MRN: [id number] Date of Birth: 1956/01/09 Referring Provider (OT): Dr Grandville Silos   Encounter Date: 09/12/2018  OT End of Session - 09/12/18 1305    Visit Number  7    Number of Visits  7    Date for OT Re-Evaluation  10/18/18    Authorization Type  3 visits approved through 12/30 /19,  3 visits approved 08/24/18 - 09/13/18    Authorization - Visit Number  6    Authorization - Number of Visits  6    OT Start Time  0947    OT Stop Time  1230    OT Time Calculation (min)  45 min    Activity Tolerance  Patient limited by pain;Patient tolerated treatment well    Behavior During Therapy  [facility] for tasks assessed/performed       Past Medical History:  Diagnosis Date  . DDD (degenerative disc disease), lumbar    L3/L4  . Diverticulosis   . GERD (gastroesophageal reflux disease)   . Hyperlipidemia   . Low sperm motility   . Pre-diabetes   . Sleep apnea    uses cpap   . SVT (supraventricular tachycardia) (HCC)     Past Surgical History:  Procedure Laterality Date  . 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 REPAIR OF LEFT INDEX AND LONG FINGER BY FUSION /  REPAIR OF RING FINGER;  Surgeon: Milly Jakob, MD;  Location: [place];  Service: Orthopedics;  Laterality: Left;  . BACK SURGERY    . CARDIAC CATHETERIZATION N/A 12/22/2015   Procedure: Left Heart Cath and Coronary Angiography;  Surgeon: Peter M Martinique, MD;  Location: [place] CV LAB;  Service: Cardiovascular;  Laterality: N/A;  . CARDIAC ELECTROPHYSIOLOGY STUDY AND ABLATION    . ELECTROPHYSIOLOGIC STUDY N/A 02/19/2016   Procedure: SVT Ablation;  Surgeon: Evans Lance, MD;  Location: [place] CV LAB;  Service: Cardiovascular;  Laterality: N/A;  . septal reconstruction  2017  . TURBINATE REDUCTION  Bilateral 07/2016  . WISDOM TOOTH EXTRACTION      There were no vitals filed for this visit.  Subjective Assessment - 09/12/18 1151    Pertinent History  surgery 07/15/18    Patient Stated Goals  Decreased pain, get use of hand back    Currently in Pain?  Yes    Pain Score  --   4/10 at rest, up to 10/10 w/ PROM   Pain Location  --   index and long finger   Pain Orientation  Left    Pain Descriptors / Indicators  Burning;Shooting    Pain Type  Acute pain    Pain Onset  More than a month ago    Pain Frequency  Constant    Aggravating Factors   P/ROM    Pain Relieving Factors  Rest         [facility] OT Assessment - 09/12/18 0001      Left Hand AROM   L Index PIP 0-100  85 Degrees    L Long PIP 0-100  70 Degrees       Assessed progress towards goals. Pt has improved in PIP flexion Lt index and long finger. Pt educated in desensitization and scar massage (see pt instructions for details). Reassessed fit of mallet finger splints - pt able to reduce down in  size from 6 to 5.5. both fingers. Issued new pre-fab mallet finger splints and discussed wrapping technique w/ coban that will allow more PIP motion.  A/ROM, place and hold, and P/ROM at PIP joints isolated, and compositely w/ MP flexion. Pt has approx 75% towards flat fist at this time                OT Education - 09/12/18 1153    Education Details  desensitization techniques and scar massage    Person(s) Educated  Patient    Methods  Explanation;Demonstration;Handout    Comprehension  Verbalized understanding;Returned demonstration       OT Short Term Goals - 08/10/18 1153      OT SHORT TERM GOAL #1   Title  Pt will be Mod I splinting use, care and precautions    Baseline  vc's required and min assist to apply on fingers    Time  4    Period  Weeks    Status  Achieved      OT SHORT TERM GOAL #2   Title  Pt will be Mod I initial HEP Left hand/fingers    Baseline  Min VC's and tc's required    Time  4     Period  Weeks    Status  Achieved      OT SHORT TERM GOAL #3   Title  Pt will indepdendently verbalize dressing changes &  possible signs and symptoms of infection    Baseline  Min VC's required    Time  4    Period  Weeks    Status  Achieved      OT SHORT TERM GOAL #4   Title  Pt will be Mod I edema control techniques LUE    Baseline  required Min vc's to implement    Time  4    Period  Weeks    Status  Achieved        OT Long Term Goals - 09/12/18 1306      OT LONG TERM GOAL #1   Title  Pt will be Mod I updated HEP L hand/fingers    Baseline  dependent d/t current precautions    Time  8    Period  Weeks    Status  New      OT LONG TERM GOAL #2   Title  Pt will demonstrate functional A/ROM L hand as seen by ability to make flat fist    Baseline  dependent    Time  8    Period  Weeks    Status  On-going   approx 75% towards flat fist     OT LONG TERM GOAL #3   Title  Pt will be Mod I scar management L hand/fingers    Baseline  dependent    Time  8    Period  Weeks    Status  Achieved      OT LONG TERM GOAL #4   Title  Pt will report use of left hand for light household duties as non-dominant hand    Baseline  limited d/t current precautions    Time  8    Period  Weeks    Status  On-going      OT LONG TERM GOAL #5   Title  Pt will report pain left hand as 3/10 or less during ADL's and light functional activities in household setting.    Baseline  07/26/18 pain = 6/10    Time  8    Period  Weeks    Status  Not Met   Pt usually b/t 4-10/10     Long Term Additional Goals   Additional Long Term Goals  Yes      OT LONG TERM GOAL #6   Title  Pt to demo 5* or > improvement in PIP flexion Lt index and long finger     Baseline  09/12/18: index = 85*, long = 70*    Time  6    Period  Weeks    Status  New      OT LONG TERM GOAL #7   Title  Pt to demo Lt grip strength to 40 lbs or greater to assist w/ opening tight jars/containers    Baseline  dependent d/t  current precautions    Time  6    Period  Weeks    Status  New            Plan - 09/12/18 1312    Clinical Impression Statement  Pt has met all STG's and LTG #3. Pt limited in progressing towards remaining goals until MD lifts strengthening precautions. Added 2 new LTG's to POC today    Occupational performance deficits (Please refer to evaluation for details):  ADL's;Rest and Sleep;Work;Leisure;IADL's    Rehab Potential  Good    OT Frequency  2x / week    OT Duration  6 weeks   beginning week of 09/18/18   OT Treatment/Interventions  Therapeutic exercise;Splinting;Patient/family education;Fluidtherapy;Scar mobilization;Therapeutic activities;Manual Therapy;Passive range of motion;Ultrasound;Paraffin;Self-care/ADL training    Plan  continue with ROM Lt hand    Consulted and Agree with Plan of Care  Patient       Patient will benefit from skilled therapeutic intervention in order to improve the following deficits and impairments:  Decreased skin integrity, Increased edema, Impaired flexibility, Pain, Decreased coordination, Decreased mobility, Decreased scar mobility, Decreased activity tolerance, Decreased range of motion, Decreased strength, Decreased knowledge of precautions, Impaired UE functional use  Visit Diagnosis: Pain in left hand  Stiffness of left hand, not elsewhere classified  Pain in joint of left hand    Problem List Patient Active Problem List   Diagnosis Date Noted  . Pain in right foot 03/30/2018  . Perennial and seasonal allergic rhinitis 11/09/2017  . History of food allergy 11/09/2017  . Wheezing 11/09/2017  . Dyspnea/wheezing 10/31/2017  . Cough 10/31/2017  . Bronchitis, mucopurulent recurrent ([place]) 09/07/2017  . SVT (supraventricular tachycardia) ([place]) 04/23/2015  . Abdominal pain, epigastric 06/03/2013  . Unspecified vitamin D deficiency 04/09/2013  . Esophageal reflux 04/09/2013  . Hepatitis B carrier ([place]) 04/09/2013  . Other and unspecified  hyperlipidemia 04/09/2013  . Unspecified sleep apnea 04/09/2013    Carey Bullocks, OTR/L 09/12/2018, 2:14 PM  [facility] [address] [place], [place], [address] Phone: [phone]   Fax:  [phone]  Name: KEYMARI SATO MRN: [id number] Date of Birth: [birth year]

## 2018-09-12 NOTE — Patient Instructions (Signed)
Desensitization Techniques  Perform these exercises ever 2 hours for 5 minute sessions.  Progress to the next exercises when the exercises you are doing become easy.  1)  Using light pressure, rub the various textures along with the hypersensitive area:  A.  Flannel  E.  Polyester  B.  Velvet  F.  Corduroy  C.  Wool  G.  Cotton material  D.  Terry cloth  2)  With the same textures use a firmer pressure.  Scar Massage Purpose: To soften/smooth scar tissue.   To desensitize sensitive areas after surgery.   To mechanically break up inner scar tissue, adhesions, therefore allowing freer        movement of injured tendons and muscle.  Technique: Use a cream to massage with, as it insures a smooth gliding motion and avoids irritation caused by rubbing skin to skin.  Cream is preferred over a lotion.   May begin as soon as any suture areas are healed.   Apply a firm, steady pressure with your finger-tip, pulling the skin in a circular motion over the scarred area.  Do not rub.  Message 5 minutes, at least 2 times a day, unless you are getting tender afterwards

## 2018-09-21 ENCOUNTER — Ambulatory Visit: Payer: Medicaid Other | Admitting: Occupational Therapy

## 2018-09-22 ENCOUNTER — Ambulatory Visit: Payer: Medicaid Other | Admitting: Occupational Therapy

## 2018-09-22 ENCOUNTER — Encounter: Payer: Medicaid Other | Admitting: Occupational Therapy

## 2018-09-22 DIAGNOSIS — M25542 Pain in joints of left hand: Secondary | ICD-10-CM

## 2018-09-22 DIAGNOSIS — M79642 Pain in left hand: Secondary | ICD-10-CM

## 2018-09-22 DIAGNOSIS — M25642 Stiffness of left hand, not elsewhere classified: Secondary | ICD-10-CM

## 2018-09-22 DIAGNOSIS — R278 Other lack of coordination: Secondary | ICD-10-CM

## 2018-09-22 NOTE — Therapy (Signed)
[facility] [address] [place], [place], [address] Phone: [phone]   Fax:  [phone]  Occupational Therapy Treatment  Patient Details  Name: Richard Walker MRN: [id number] Date of Birth: 08-04-1956 Referring Provider (OT): Dr Janee Morn   Encounter Date: 09/22/2018  OT End of Session - 09/22/18 1530    Visit Number  8    Number of Visits  19    Date for OT Re-Evaluation  11/06/18    Authorization Time Period  additional 12 visits approved 09/22/18-11/02/2018    Authorization - Visit Number  7    Authorization - Number of Visits  18    OT Start Time  0804    OT Stop Time  0835    OT Time Calculation (min)  31 min    Activity Tolerance  Patient limited by pain;Patient tolerated treatment well    Behavior During Therapy  [facility] for tasks assessed/performed       Past Medical History:  Diagnosis Date  . DDD (degenerative disc disease), lumbar    L3/L4  . Diverticulosis   . GERD (gastroesophageal reflux disease)   . Hyperlipidemia   . Low sperm motility   . Pre-diabetes   . Sleep apnea    uses cpap   . SVT (supraventricular tachycardia) (HCC)     Past Surgical History:  Procedure Laterality Date  . 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 REPAIR OF LEFT INDEX AND LONG FINGER BY FUSION /  REPAIR OF RING FINGER;  Surgeon: Mack Hook, MD;  Location: [facility] OR;  Service: Orthopedics;  Laterality: Left;  . BACK SURGERY    . CARDIAC CATHETERIZATION N/A 12/22/2015   Procedure: Left Heart Cath and Coronary Angiography;  Surgeon: Peter M Swaziland, MD;  Location: [facility] INVASIVE CV LAB;  Service: Cardiovascular;  Laterality: N/A;  . CARDIAC ELECTROPHYSIOLOGY STUDY AND ABLATION    . ELECTROPHYSIOLOGIC STUDY N/A 02/19/2016   Procedure: SVT Ablation;  Surgeon: Marinus Maw, MD;  Location: [facility] INVASIVE CV LAB;  Service: Cardiovascular;  Laterality: N/A;  . septal reconstruction  2017  . TURBINATE REDUCTION Bilateral 07/2016  .  WISDOM TOOTH EXTRACTION      There were no vitals filed for this visit.  Subjective Assessment - 09/22/18 0845    Subjective   Pt reports mild pain    Pertinent History  surgery 07/15/18    Patient Stated Goals  Decreased pain, get use of hand back    Currently in Pain?  Yes    Pain Score  2     Pain Location  Hand    Pain Orientation  Left    Pain Descriptors / Indicators  Burning    Pain Type  Acute pain    Pain Onset  More than a month ago    Pain Frequency  Constant    Aggravating Factors   P/ROM    Pain Relieving Factors  rest                Treatment:US 3 MHZ, 0.8 w/cm 2, 20% x 8 mins to palm and volar index, middle fingers, no adverse reactions followed by A/ROM, place and hold, and P/ROM at PIP joints isolated, and compositely w/ MP flexion. Pt's splints were rewrapped with coban and fresh stockinette provided.             OT Short Term Goals - 08/10/18 1153      OT SHORT TERM GOAL #1   Title  Pt will  be Mod I splinting use, care and precautions    Baseline  vc's required and min assist to apply on fingers    Time  4    Period  Weeks    Status  Achieved      OT SHORT TERM GOAL #2   Title  Pt will be Mod I initial HEP Left hand/fingers    Baseline  Min VC's and tc's required    Time  4    Period  Weeks    Status  Achieved      OT SHORT TERM GOAL #3   Title  Pt will indepdendently verbalize dressing changes &  possible signs and symptoms of infection    Baseline  Min VC's required    Time  4    Period  Weeks    Status  Achieved      OT SHORT TERM GOAL #4   Title  Pt will be Mod I edema control techniques LUE    Baseline  required Min vc's to implement    Time  4    Period  Weeks    Status  Achieved        OT Long Term Goals - 09/12/18 1306      OT LONG TERM GOAL #1   Title  Pt will be Mod I updated HEP L hand/fingers    Baseline  dependent d/t current precautions    Time  8    Period  Weeks    Status  New      OT LONG TERM  GOAL #2   Title  Pt will demonstrate functional A/ROM L hand as seen by ability to make flat fist    Baseline  dependent    Time  8    Period  Weeks    Status  On-going   approx 75% towards flat fist     OT LONG TERM GOAL #3   Title  Pt will be Mod I scar management L hand/fingers    Baseline  dependent    Time  8    Period  Weeks    Status  Achieved      OT LONG TERM GOAL #4   Title  Pt will report use of left hand for light household duties as non-dominant hand    Baseline  limited d/t current precautions    Time  8    Period  Weeks    Status  On-going      OT LONG TERM GOAL #5   Title  Pt will report pain left hand as 3/10 or less during ADL's and light functional activities in household setting.    Baseline  07/26/18 pain = 6/10    Time  8    Period  Weeks    Status  Not Met   Pt usually b/t 4-10/10     Long Term Additional Goals   Additional Long Term Goals  Yes      OT LONG TERM GOAL #6   Title  Pt to demo 5* or > improvement in PIP flexion Lt index and long finger     Baseline  09/12/18: index = 85*, long = 70*    Time  6    Period  Weeks    Status  New      OT LONG TERM GOAL #7   Title  Pt to demo Lt grip strength to 40 lbs or greater to assist w/ opening tight jars/containers    Baseline  dependent d/t current precautions    Time  6    Period  Weeks    Status  New            Plan - 09/22/18 1324    Clinical Impression Statement  Pt is progressing towards goals.       Patient will benefit from skilled therapeutic intervention in order to improve the following deficits and impairments:     Visit Diagnosis: Pain in left hand  Stiffness of left hand, not elsewhere classified  Pain in joint of left hand  Other lack of coordination    Problem List Patient Active Problem List   Diagnosis Date Noted  . Pain in right foot 03/30/2018  . Perennial and seasonal allergic rhinitis 11/09/2017  . History of food allergy 11/09/2017  . Wheezing  11/09/2017  . Dyspnea/wheezing 10/31/2017  . Cough 10/31/2017  . Bronchitis, mucopurulent recurrent (HCC) 09/07/2017  . SVT (supraventricular tachycardia) (HCC) 04/23/2015  . Abdominal pain, epigastric 06/03/2013  . Unspecified vitamin D deficiency 04/09/2013  . Esophageal reflux 04/09/2013  . Hepatitis B carrier (HCC) 04/09/2013  . Other and unspecified hyperlipidemia 04/09/2013  . Unspecified sleep apnea 04/09/2013    Katherina Wimer 09/22/2018, 3:32 PM  [facility] [address] [place], [place], [address] Phone: [phone]   Fax:  [phone]  Name: Richard Walker MRN: [id number] Date of Birth: April 08, 1956

## 2018-09-25 ENCOUNTER — Encounter: Payer: Medicaid Other | Admitting: Podiatry

## 2018-09-26 ENCOUNTER — Ambulatory Visit: Payer: Medicaid Other | Attending: Medical | Admitting: Occupational Therapy

## 2018-09-26 DIAGNOSIS — M79642 Pain in left hand: Secondary | ICD-10-CM | POA: Diagnosis not present

## 2018-09-26 DIAGNOSIS — M25642 Stiffness of left hand, not elsewhere classified: Secondary | ICD-10-CM

## 2018-09-26 DIAGNOSIS — M25542 Pain in joints of left hand: Secondary | ICD-10-CM | POA: Insufficient documentation

## 2018-09-26 DIAGNOSIS — R278 Other lack of coordination: Secondary | ICD-10-CM | POA: Insufficient documentation

## 2018-09-26 NOTE — Therapy (Signed)
[facility] [address] [place], [place], [address] Phone: [phone]   Fax:  [phone]  Occupational Therapy Treatment  Patient Details  Name: Richard Walker MRN: [id number] Date of Birth: 10-Nov-1955 Referring Provider (OT): Dr Grandville Silos   Encounter Date: 09/26/2018  OT End of Session - 09/26/18 1302    Visit Number  9    Number of Visits  19    Date for OT Re-Evaluation  11/06/18    Authorization Type  3 visits approved through 12/30 /19,  3 visits approved 08/24/18 - 09/13/18    Authorization Time Period  additional 12 visits approved 09/22/18-11/02/2018    Authorization - Visit Number  8    Authorization - Number of Visits  18    OT Start Time  6283    OT Stop Time  1230    OT Time Calculation (min)  45 min    Activity Tolerance  Patient limited by pain;Patient tolerated treatment well    Behavior During Therapy  [facility] for tasks assessed/performed       Past Medical History:  Diagnosis Date  . DDD (degenerative disc disease), lumbar    L3/L4  . Diverticulosis   . GERD (gastroesophageal reflux disease)   . Hyperlipidemia   . Low sperm motility   . Pre-diabetes   . Sleep apnea    uses cpap   . SVT (supraventricular tachycardia) (HCC)     Past Surgical History:  Procedure Laterality Date  . 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 REPAIR OF LEFT INDEX AND LONG FINGER BY FUSION /  REPAIR OF RING FINGER;  Surgeon: Milly Jakob, MD;  Location: [facility];  Service: Orthopedics;  Laterality: Left;  . BACK SURGERY    . CARDIAC CATHETERIZATION N/A 12/22/2015   Procedure: Left Heart Cath and Coronary Angiography;  Surgeon: Peter M Martinique, MD;  Location: [place] CV LAB;  Service: Cardiovascular;  Laterality: N/A;  . CARDIAC ELECTROPHYSIOLOGY STUDY AND ABLATION    . ELECTROPHYSIOLOGIC STUDY N/A 02/19/2016   Procedure: SVT Ablation;  Surgeon: Evans Lance, MD;  Location: [place] CV LAB;  Service: Cardiovascular;   Laterality: N/A;  . septal reconstruction  2017  . TURBINATE REDUCTION Bilateral 07/2016  . WISDOM TOOTH EXTRACTION      There were no vitals filed for this visit.  Subjective Assessment - 09/26/18 1154    Pertinent History  surgery 07/15/18    Patient Stated Goals  Decreased pain, get use of hand back    Currently in Pain?  Yes    Pain Score  2    up to 8/10 w/ exercise   Pain Orientation  Left    Pain Descriptors / Indicators  Burning    Pain Type  Acute pain    Pain Onset  More than a month ago    Pain Frequency  Intermittent    Aggravating Factors   P/ROM    Pain Relieving Factors  rest       Therapist adjusted mallet finger splints slightly.  Worked remainder of session on A/ROM, P/ROM, place and hold ex's for isolated PIP flexion and extension, and composite flat fist. Pt instructed from flat fist to come back up to hook position, then straighten fingers. Pt noted to have extensor lag Lt long finger PIP joint and pt shown proper way to stretch in extension w/ MP joint in slight flexion  OT Short Term Goals - 08/10/18 1153      OT SHORT TERM GOAL #1   Title  Pt will be Mod I splinting use, care and precautions    Baseline  vc's required and min assist to apply on fingers    Time  4    Period  Weeks    Status  Achieved      OT SHORT TERM GOAL #2   Title  Pt will be Mod I initial HEP Left hand/fingers    Baseline  Min VC's and tc's required    Time  4    Period  Weeks    Status  Achieved      OT SHORT TERM GOAL #3   Title  Pt will indepdendently verbalize dressing changes &  possible signs and symptoms of infection    Baseline  Min VC's required    Time  4    Period  Weeks    Status  Achieved      OT SHORT TERM GOAL #4   Title  Pt will be Mod I edema control techniques LUE    Baseline  required Min vc's to implement    Time  4    Period  Weeks    Status  Achieved        OT Long Term Goals - 09/12/18 1306      OT  LONG TERM GOAL #1   Title  Pt will be Mod I updated HEP L hand/fingers    Baseline  dependent d/t current precautions    Time  8    Period  Weeks    Status  New      OT LONG TERM GOAL #2   Title  Pt will demonstrate functional A/ROM L hand as seen by ability to make flat fist    Baseline  dependent    Time  8    Period  Weeks    Status  On-going   approx 75% towards flat fist     OT LONG TERM GOAL #3   Title  Pt will be Mod I scar management L hand/fingers    Baseline  dependent    Time  8    Period  Weeks    Status  Achieved      OT LONG TERM GOAL #4   Title  Pt will report use of left hand for light household duties as non-dominant hand    Baseline  limited d/t current precautions    Time  8    Period  Weeks    Status  On-going      OT LONG TERM GOAL #5   Title  Pt will report pain left hand as 3/10 or less during ADL's and light functional activities in household setting.    Baseline  07/26/18 pain = 6/10    Time  8    Period  Weeks    Status  Not Met   Pt usually b/t 4-10/10     Long Term Additional Goals   Additional Long Term Goals  Yes      OT LONG TERM GOAL #6   Title  Pt to demo 5* or > improvement in PIP flexion Lt index and long finger     Baseline  09/12/18: index = 85*, long = 70*    Time  6    Period  Weeks    Status  New      OT LONG TERM GOAL #7  Title  Pt to demo Lt grip strength to 40 lbs or greater to assist w/ opening tight jars/containers    Baseline  dependent d/t current precautions    Time  6    Period  Weeks    Status  New            Plan - 09/26/18 1302    Clinical Impression Statement  Pt progressing with ROM Lt index and long fingers. Pt still has high pain w/ P/ROM but he is more tolerant to pain now. MD note sent via pt today re: progression of therapy (d/c splints? and begin strengthening?)    Occupational performance deficits (Please refer to evaluation for details):  ADL's;Rest and Sleep;Work;Leisure;IADL's    Rehab  Potential  Good    Current Impairments/barriers affecting progress:  Pain and open wounds L index, long and ring fingers    OT Frequency  2x / week    OT Duration  6 weeks    OT Treatment/Interventions  Therapeutic exercise;Splinting;Patient/family education;Fluidtherapy;Scar mobilization;Therapeutic activities;Manual Therapy;Passive range of motion;Ultrasound;Paraffin;Self-care/ADL training    Plan  continue with ROM Lt hand, progress as able per MD recommendations    Consulted and Agree with Plan of Care  Patient       Patient will benefit from skilled therapeutic intervention in order to improve the following deficits and impairments:  Decreased skin integrity, Increased edema, Impaired flexibility, Pain, Decreased coordination, Decreased mobility, Decreased scar mobility, Decreased activity tolerance, Decreased range of motion, Decreased strength, Decreased knowledge of precautions, Impaired UE functional use  Visit Diagnosis: Pain in left hand  Stiffness of left hand, not elsewhere classified  Pain in joint of left hand    Problem List Patient Active Problem List   Diagnosis Date Noted  . Pain in right foot 03/30/2018  . Perennial and seasonal allergic rhinitis 11/09/2017  . History of food allergy 11/09/2017  . Wheezing 11/09/2017  . Dyspnea/wheezing 10/31/2017  . Cough 10/31/2017  . Bronchitis, mucopurulent recurrent ([place]) 09/07/2017  . SVT (supraventricular tachycardia) ([place]) 04/23/2015  . Abdominal pain, epigastric 06/03/2013  . Unspecified vitamin D deficiency 04/09/2013  . Esophageal reflux 04/09/2013  . Hepatitis B carrier ([place]) 04/09/2013  . Other and unspecified hyperlipidemia 04/09/2013  . Unspecified sleep apnea 04/09/2013    Carey Bullocks, OTR/L 09/26/2018, 1:05 PM  [facility] [address] [place], [place], [address] Phone: [phone]   Fax:  [phone]  Name: Richard Walker MRN:  [id number] Date of Birth: Dec 19, 1955

## 2018-09-29 ENCOUNTER — Ambulatory Visit: Payer: Medicaid Other | Admitting: Occupational Therapy

## 2018-09-29 DIAGNOSIS — M25642 Stiffness of left hand, not elsewhere classified: Secondary | ICD-10-CM

## 2018-09-29 DIAGNOSIS — R278 Other lack of coordination: Secondary | ICD-10-CM

## 2018-09-29 DIAGNOSIS — M79642 Pain in left hand: Secondary | ICD-10-CM

## 2018-09-29 DIAGNOSIS — M25542 Pain in joints of left hand: Secondary | ICD-10-CM

## 2018-09-29 NOTE — Patient Instructions (Signed)
1. Grip Strengthening (Resistive Putty)   Squeeze putty using thumb and all fingers. You must wear finger splints for this exercise. Repeat _20___ times. Do __2__ sessions per day.   \   Copyright  VHI. All rights reserved.

## 2018-09-29 NOTE — Therapy (Signed)
Baseline  09/12/18: index = 85*, long = 70*    Time  6    Period  Weeks    Status  New      OT LONG TERM GOAL #7   Title  Pt to demo Lt grip strength to 40 lbs or greater to assist w/ opening tight jars/containers    Baseline  dependent d/t current precautions    Time  6    Period  Weeks    Status  New            Plan - 09/29/18 1129    Clinical Impression Statement  Pt arrived with new orders fro MD, pt is cleared for use of putty. He must continue to weat finger splints and A/ROM is not  anticipated to DIP joints. Pt is progressing towards goals.    Occupational performance deficits (Please refer to evaluation for details):  ADL's;Rest and Sleep;Work;Leisure;IADL's    Rehab Potential  Good    Current Impairments/barriers affecting progress:  Pain and open wounds L index, long and ring fingers    OT Frequency  2x / week    OT Duration  6 weeks    OT Treatment/Interventions  Therapeutic exercise;Splinting;Patient/family education;Fluidtherapy;Scar mobilization;Therapeutic activities;Manual Therapy;Passive range of motion;Ultrasound;Paraffin;Self-care/ADL training    Plan  continue with ROM Lt hand, progress per MD recommendations    Consulted and Agree with Plan of Care  Patient       Patient will benefit from skilled therapeutic intervention in order to improve the following deficits and impairments:  Decreased skin integrity, Increased edema, Impaired flexibility, Pain, Decreased coordination, Decreased mobility, Decreased scar mobility, Decreased activity tolerance, Decreased range of motion, Decreased strength, Decreased knowledge of precautions, Impaired UE functional use  Visit Diagnosis: Pain in left hand  Stiffness of left hand, not elsewhere classified  Pain in joint of left hand  Other lack of coordination    Problem List Patient Active Problem List   Diagnosis Date Noted  . Pain in right foot 03/30/2018  . Perennial and seasonal allergic rhinitis 11/09/2017  . History of food allergy 11/09/2017  . Wheezing 11/09/2017  . Dyspnea/wheezing 10/31/2017  . Cough 10/31/2017  . Bronchitis, mucopurulent recurrent ([place]) 09/07/2017  . SVT (supraventricular tachycardia) ([place]) 04/23/2015  . Abdominal pain, epigastric 06/03/2013  . Unspecified vitamin D deficiency 04/09/2013  . Esophageal reflux 04/09/2013  . Hepatitis B carrier ([place]) 04/09/2013  . Other and unspecified hyperlipidemia 04/09/2013  . Unspecified sleep apnea 04/09/2013    Ernesta Trabert 09/29/2018,  11:31 AM Theone Murdoch, OTR/L [phone] Phone: [phone] 11:36 AM 09/29/18 [facility] [address] [place], [place], [address] Phone: [phone]   Fax:  [phone]  Name: Richard Walker MRN: [id number] Date of Birth: 1956/07/02  Baseline  09/12/18: index = 85*, long = 70*    Time  6    Period  Weeks    Status  New      OT LONG TERM GOAL #7   Title  Pt to demo Lt grip strength to 40 lbs or greater to assist w/ opening tight jars/containers    Baseline  dependent d/t current precautions    Time  6    Period  Weeks    Status  New            Plan - 09/29/18 1129    Clinical Impression Statement  Pt arrived with new orders fro MD, pt is cleared for use of putty. He must continue to weat finger splints and A/ROM is not  anticipated to DIP joints. Pt is progressing towards goals.    Occupational performance deficits (Please refer to evaluation for details):  ADL's;Rest and Sleep;Work;Leisure;IADL's    Rehab Potential  Good    Current Impairments/barriers affecting progress:  Pain and open wounds L index, long and ring fingers    OT Frequency  2x / week    OT Duration  6 weeks    OT Treatment/Interventions  Therapeutic exercise;Splinting;Patient/family education;Fluidtherapy;Scar mobilization;Therapeutic activities;Manual Therapy;Passive range of motion;Ultrasound;Paraffin;Self-care/ADL training    Plan  continue with ROM Lt hand, progress per MD recommendations    Consulted and Agree with Plan of Care  Patient       Patient will benefit from skilled therapeutic intervention in order to improve the following deficits and impairments:  Decreased skin integrity, Increased edema, Impaired flexibility, Pain, Decreased coordination, Decreased mobility, Decreased scar mobility, Decreased activity tolerance, Decreased range of motion, Decreased strength, Decreased knowledge of precautions, Impaired UE functional use  Visit Diagnosis: Pain in left hand  Stiffness of left hand, not elsewhere classified  Pain in joint of left hand  Other lack of coordination    Problem List Patient Active Problem List   Diagnosis Date Noted  . Pain in right foot 03/30/2018  . Perennial and seasonal allergic rhinitis 11/09/2017  . History of food allergy 11/09/2017  . Wheezing 11/09/2017  . Dyspnea/wheezing 10/31/2017  . Cough 10/31/2017  . Bronchitis, mucopurulent recurrent ([place]) 09/07/2017  . SVT (supraventricular tachycardia) ([place]) 04/23/2015  . Abdominal pain, epigastric 06/03/2013  . Unspecified vitamin D deficiency 04/09/2013  . Esophageal reflux 04/09/2013  . Hepatitis B carrier ([place]) 04/09/2013  . Other and unspecified hyperlipidemia 04/09/2013  . Unspecified sleep apnea 04/09/2013    Ernesta Trabert 09/29/2018,  11:31 AM Theone Murdoch, OTR/L [phone] Phone: [phone] 11:36 AM 09/29/18 [facility] [address] [place], [place], [address] Phone: [phone]   Fax:  [phone]  Name: Richard Walker MRN: [id number] Date of Birth: 1956/07/02  [facility] [address] [place], [place], [address] Phone: [phone]   Fax:  [phone]  Occupational Therapy Treatment  Patient Details  Name: Richard Walker MRN: [id number] Date of Birth: [birth year] Referring Provider (OT): Dr Grandville Silos   Encounter Date: 09/29/2018  OT End of Session - 09/29/18 1126    Visit Number  10    Number of Visits  19    Date for OT Re-Evaluation  11/06/18    Authorization Type  3 visits approved through 12/30 /19,  3 visits approved 08/24/18 - 09/13/18    Authorization Time Period  additional 12 visits approved 09/22/18-11/02/2018    Authorization - Visit Number  9    Authorization - Number of Visits  18    OT Start Time  1018    OT Stop Time  1104    OT Time Calculation (min)  46 min    Activity Tolerance  Patient limited by pain;Patient tolerated treatment well       Past Medical History:  Diagnosis Date  . DDD (degenerative disc disease), lumbar    L3/L4  . Diverticulosis   . GERD (gastroesophageal reflux disease)   . Hyperlipidemia   . Low sperm motility   . Pre-diabetes   . Sleep apnea    uses cpap   . SVT (supraventricular tachycardia) (HCC)     Past Surgical History:  Procedure Laterality Date  . 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 REPAIR OF LEFT INDEX AND LONG FINGER BY FUSION /  REPAIR OF RING FINGER;  Surgeon: Milly Jakob, MD;  Location: [facility];  Service: Orthopedics;  Laterality: Left;  . BACK SURGERY    . CARDIAC CATHETERIZATION N/A 12/22/2015   Procedure: Left Heart Cath and Coronary Angiography;  Surgeon: Peter M Martinique, MD;  Location: [facility] CV LAB;  Service: Cardiovascular;  Laterality: N/A;  . CARDIAC ELECTROPHYSIOLOGY STUDY AND ABLATION    . ELECTROPHYSIOLOGIC STUDY N/A 02/19/2016   Procedure: SVT Ablation;  Surgeon: Evans Lance, MD;  Location: [facility] CV LAB;  Service: Cardiovascular;  Laterality: N/A;  . septal reconstruction  2017  .  TURBINATE REDUCTION Bilateral 07/2016  . WISDOM TOOTH EXTRACTION      There were no vitals filed for this visit.  Subjective Assessment - 09/29/18 1124    Pertinent History  surgery 07/15/18    Limitations  Pt arrived with orders from Dr. Grandville Silos that he may begin  putty ex, he must continue to wear his splints and active movement is not expected at DIP joints digits 2,3    Patient Stated Goals  Decreased pain, get use of hand back    Currently in Pain?  Yes    Pain Score  2     Pain Location  Hand    Pain Orientation  Left    Pain Descriptors / Indicators  Aching    Pain Type  Acute pain    Pain Onset  More than a month ago    Pain Frequency  Intermittent    Aggravating Factors   P/ROM    Pain Relieving Factors  rest               Treatment: [place] to digits 2,3 and and volar palm 3Mhz, 0.8 w/cm2, 20% for stiffness, no adverse reactions.  A/ROM, P/ROM, and isolated PIP flexion and extension to LUE. Splints were donned and rewrapped. Pt was cleared by MD to perform putty exercises. Putty was placed in a

## 2018-10-01 NOTE — Progress Notes (Signed)
This encounter was created in error - please disregard.

## 2018-10-02 ENCOUNTER — Encounter: Payer: Self-pay | Admitting: Podiatry

## 2018-10-02 ENCOUNTER — Ambulatory Visit: Payer: Medicaid Other | Admitting: Podiatry

## 2018-10-02 DIAGNOSIS — G629 Polyneuropathy, unspecified: Secondary | ICD-10-CM

## 2018-10-02 DIAGNOSIS — M7752 Other enthesopathy of left foot: Secondary | ICD-10-CM

## 2018-10-02 DIAGNOSIS — M7751 Other enthesopathy of right foot: Secondary | ICD-10-CM | POA: Diagnosis not present

## 2018-10-02 MED ORDER — GABAPENTIN 100 MG PO CAPS: 100.0000 mg | ORAL_CAPSULE | Freq: Three times a day (TID) | ORAL | 1 refills | Status: DC

## 2018-10-03 ENCOUNTER — Ambulatory Visit: Payer: Medicaid Other | Admitting: Occupational Therapy

## 2018-10-03 DIAGNOSIS — M79642 Pain in left hand: Secondary | ICD-10-CM

## 2018-10-03 DIAGNOSIS — M25642 Stiffness of left hand, not elsewhere classified: Secondary | ICD-10-CM

## 2018-10-03 NOTE — Therapy (Signed)
[facility] [address] [place], [place], [address] Phone: [phone]   Fax:  [phone]  Occupational Therapy Treatment  Patient Details  Name: Richard Walker MRN: [id number] Date of Birth: January 22, 1956 Referring Provider (OT): Dr Grandville Silos   Encounter Date: 10/03/2018  OT End of Session - 10/03/18 1224    Visit Number  11    Number of Visits  19    Date for OT Re-Evaluation  11/06/18    Authorization Type  3 visits approved through 12/30 /19,  3 visits approved 08/24/18 - 09/13/18    Authorization Time Period  additional 12 visits approved 09/22/18-11/02/2018    Authorization - Visit Number  10    Authorization - Number of Visits  18    OT Start Time  1100    OT Stop Time  1145    OT Time Calculation (min)  45 min    Activity Tolerance  Patient limited by pain;Patient tolerated treatment well    Behavior During Therapy  [facility] for tasks assessed/performed       Past Medical History:  Diagnosis Date  . DDD (degenerative disc disease), lumbar    L3/L4  . Diverticulosis   . GERD (gastroesophageal reflux disease)   . Hyperlipidemia   . Low sperm motility   . Pre-diabetes   . Sleep apnea    uses cpap   . SVT (supraventricular tachycardia) (HCC)     Past Surgical History:  Procedure Laterality Date  . 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 REPAIR OF LEFT INDEX AND LONG FINGER BY FUSION /  REPAIR OF RING FINGER;  Surgeon: Milly Jakob, MD;  Location: [facility];  Service: Orthopedics;  Laterality: Left;  . BACK SURGERY    . CARDIAC CATHETERIZATION N/A 12/22/2015   Procedure: Left Heart Cath and Coronary Angiography;  Surgeon: Peter M Martinique, MD;  Location: [facility];  Service: Cardiovascular;  Laterality: N/A;  . CARDIAC ELECTROPHYSIOLOGY STUDY AND ABLATION    . ELECTROPHYSIOLOGIC STUDY N/A 02/19/2016   Procedure: SVT Ablation;  Surgeon: Evans Lance, MD;  Location: [facility];  Service:  Cardiovascular;  Laterality: N/A;  . septal reconstruction  2017  . TURBINATE REDUCTION Bilateral 07/2016  . WISDOM TOOTH EXTRACTION      There were no vitals filed for this visit.  Subjective Assessment - 10/03/18 1223    Pertinent History  surgery 07/15/18    Limitations  Pt arrived with orders from Dr. Grandville Silos that he may begin  putty ex, he must continue to wear his splints and active movement is not expected at DIP joints digits 2,3    Patient Stated Goals  Decreased pain, get use of hand back    Currently in Pain?  Yes    Pain Score  --   fluctuates   Pain Location  --   index and long fingers   Pain Orientation  Left    Pain Descriptors / Indicators  Aching;Sore    Pain Type  Acute pain    Pain Onset  More than a month ago    Pain Frequency  Intermittent    Aggravating Factors   P/ROM    Pain Relieving Factors  rest       Performed scar massage over dorsal long finger d/t hypersensitivity and tightness of skin.  Issued size 5 mallet finger splints to index and long fingers secondary to decreased swelling/better fit. Pt still to keep size 5.5 for both fingers  d/t fluctuating swelling.  Continued A/ROM, P/ROM, and place and hold ex's for isolated PIP flexion, and in conjunction w/ MP flexion. Pt did not bring putty in today                      OT Short Term Goals - 08/10/18 1153      OT SHORT TERM GOAL #1   Title  Pt will be Mod I splinting use, care and precautions    Baseline  vc's required and min assist to apply on fingers    Time  4    Period  Weeks    Status  Achieved      OT SHORT TERM GOAL #2   Title  Pt will be Mod I initial HEP Left hand/fingers    Baseline  Min VC's and tc's required    Time  4    Period  Weeks    Status  Achieved      OT SHORT TERM GOAL #3   Title  Pt will indepdendently verbalize dressing changes &  possible signs and symptoms of infection    Baseline  Min VC's required    Time  4    Period  Weeks    Status   Achieved      OT SHORT TERM GOAL #4   Title  Pt will be Mod I edema control techniques LUE    Baseline  required Min vc's to implement    Time  4    Period  Weeks    Status  Achieved        OT Long Term Goals - 09/12/18 1306      OT LONG TERM GOAL #1   Title  Pt will be Mod I updated HEP L hand/fingers    Baseline  dependent d/t current precautions    Time  8    Period  Weeks    Status  New      OT LONG TERM GOAL #2   Title  Pt will demonstrate functional A/ROM L hand as seen by ability to make flat fist    Baseline  dependent    Time  8    Period  Weeks    Status  On-going   approx 75% towards flat fist     OT LONG TERM GOAL #3   Title  Pt will be Mod I scar management L hand/fingers    Baseline  dependent    Time  8    Period  Weeks    Status  Achieved      OT LONG TERM GOAL #4   Title  Pt will report use of left hand for light household duties as non-dominant hand    Baseline  limited d/t current precautions    Time  8    Period  Weeks    Status  On-going      OT LONG TERM GOAL #5   Title  Pt will report pain left hand as 3/10 or less during ADL's and light functional activities in household setting.    Baseline  07/26/18 pain = 6/10    Time  8    Period  Weeks    Status  Not Met   Pt usually b/t 4-10/10     Long Term Additional Goals   Additional Long Term Goals  Yes      OT LONG TERM GOAL #6   Title  Pt to demo 5* or > improvement in  PIP flexion Lt index and long finger     Baseline  09/12/18: index = 85*, long = 70*    Time  6    Period  Weeks    Status  New      OT LONG TERM GOAL #7   Title  Pt to demo Lt grip strength to 40 lbs or greater to assist w/ opening tight jars/containers    Baseline  dependent d/t current precautions    Time  6    Period  Weeks    Status  New            Plan - 10/03/18 1224    Clinical Impression Statement  Pt slowly progressing w/ PIP motion. Pt still very sensitive at long finger    Occupational  performance deficits (Please refer to evaluation for details):  ADL's;Rest and Sleep;Work;Leisure;IADL's    Rehab Potential  Good    OT Frequency  2x / week    OT Duration  6 weeks    OT Treatment/Interventions  Therapeutic exercise;Splinting;Patient/family education;Fluidtherapy;Scar mobilization;Therapeutic activities;Manual Therapy;Passive range of motion;Ultrasound;Paraffin;Self-care/ADL training    Plan  try fluidotherapy w/ long finger nail covered prn, continue A/ROM, P/ROM, and place and hold w/ splints on    Consulted and Agree with Plan of Care  Patient       Patient will benefit from skilled therapeutic intervention in order to improve the following deficits and impairments:  Decreased skin integrity, Increased edema, Impaired flexibility, Pain, Decreased coordination, Decreased mobility, Decreased scar mobility, Decreased activity tolerance, Decreased range of motion, Decreased strength, Decreased knowledge of precautions, Impaired UE functional use  Visit Diagnosis: Stiffness of left hand, not elsewhere classified  Pain in left hand    Problem List Patient Active Problem List   Diagnosis Date Noted  . Pain in right foot 03/30/2018  . Perennial and seasonal allergic rhinitis 11/09/2017  . History of food allergy 11/09/2017  . Wheezing 11/09/2017  . Dyspnea/wheezing 10/31/2017  . Cough 10/31/2017  . Bronchitis, mucopurulent recurrent ([facility]) 09/07/2017  . SVT (supraventricular tachycardia) ([facility]) 04/23/2015  . Abdominal pain, epigastric 06/03/2013  . Unspecified vitamin D deficiency 04/09/2013  . Esophageal reflux 04/09/2013  . Hepatitis B carrier ([facility]) 04/09/2013  . Other and unspecified hyperlipidemia 04/09/2013  . Unspecified sleep apnea 04/09/2013    Carey Bullocks, OTR/L 10/03/2018, 12:26 PM  [facility] [address] [place], [place], [address] Phone: [phone]   Fax:  [phone]  Name:  ZAVIER CANELA MRN: [id number] Date of Birth: 1956/02/10

## 2018-10-05 ENCOUNTER — Ambulatory Visit: Payer: Medicaid Other | Admitting: Occupational Therapy

## 2018-10-05 DIAGNOSIS — M25642 Stiffness of left hand, not elsewhere classified: Secondary | ICD-10-CM

## 2018-10-05 DIAGNOSIS — M79642 Pain in left hand: Secondary | ICD-10-CM

## 2018-10-05 NOTE — Therapy (Signed)
[facility] [address] [place], [place], [address] Phone: [phone]   Fax:  [phone]  Occupational Therapy Treatment  Patient Details  Name: Richard Walker MRN: [id number] Date of Birth: [birth year] Referring Provider (OT): Dr Grandville Silos   Encounter Date: 10/05/2018  OT End of Session - 10/05/18 1519    Visit Number  12    Number of Visits  19    Date for OT Re-Evaluation  11/06/18    Authorization Type  3 visits approved through 12/30 /19,  3 visits approved 08/24/18 - 09/13/18    Authorization Time Period  additional 12 visits approved 09/22/18-11/02/2018    Authorization - Visit Number  11    Authorization - Number of Visits  18    OT Start Time  4268    OT Stop Time  3419    OT Time Calculation (min)  45 min    Activity Tolerance  Patient limited by pain;Patient tolerated treatment well    Behavior During Therapy  [facility] for tasks assessed/performed       Past Medical History:  Diagnosis Date  . DDD (degenerative disc disease), lumbar    L3/L4  . Diverticulosis   . GERD (gastroesophageal reflux disease)   . Hyperlipidemia   . Low sperm motility   . Pre-diabetes   . Sleep apnea    uses cpap   . SVT (supraventricular tachycardia) (HCC)     Past Surgical History:  Procedure Laterality Date  . 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 REPAIR OF LEFT INDEX AND LONG FINGER BY FUSION /  REPAIR OF RING FINGER;  Surgeon: Milly Jakob, MD;  Location: [facility];  Service: Orthopedics;  Laterality: Left;  . BACK SURGERY    . CARDIAC CATHETERIZATION N/A 12/22/2015   Procedure: Left Heart Cath and Coronary Angiography;  Surgeon: Peter M Martinique, MD;  Location: [facility];  Service: Cardiovascular;  Laterality: N/A;  . CARDIAC ELECTROPHYSIOLOGY STUDY AND ABLATION    . ELECTROPHYSIOLOGIC STUDY N/A 02/19/2016   Procedure: SVT Ablation;  Surgeon: Evans Lance, MD;  Location: [facility];  Service:  Cardiovascular;  Laterality: N/A;  . septal reconstruction  2017  . TURBINATE REDUCTION Bilateral 07/2016  . WISDOM TOOTH EXTRACTION      There were no vitals filed for this visit.  Subjective Assessment - 10/05/18 1445    Pertinent History  surgery 07/15/18    Limitations  Pt arrived with orders from Dr. Grandville Silos that he may begin  putty ex, he must continue to wear his splints and active movement is not expected at DIP joints digits 2,3    Patient Stated Goals  Decreased pain, get use of hand back    Currently in Pain?  Yes    Pain Score  --   fluctuates   Pain Location  --   index and long fingers   Pain Orientation  Left    Pain Descriptors / Indicators  Aching;Sore    Pain Type  Acute pain    Pain Onset  More than a month ago    Pain Frequency  Intermittent    Aggravating Factors   P/ROM    Pain Relieving Factors  Rest                   OT Treatments/Exercises (OP) - 10/05/18 0001      Exercises   Exercises  Hand      Hand Exercises   Other  Hand Exercises  A/ROM, AA/ROM, place and hold and P/ROM as tolerated to index, long, and ring fingers for PIP flexion while blocking MP joints in extension (and mallet finger splints on index and long fingers); then composite flexion/ext. with empasis on extending MP's first, then PIP's       Modalities   Modalities  Fluidotherapy;Teacher, English as a foreign language Location  volar forearm    Electrical Stimulation Action  finger flexion    Electrical Stimulation Parameters  50 pps, 250 pw, 10 sec. on/off cycle, int = 15,  x 10 min. total    Electrical Stimulation Goals  --   ROM     LUE Fluidotherapy   Number Minutes Fluidotherapy  10 Minutes    LUE Fluidotherapy Location  Hand    Comments  at beginning of session to decr. stiffness (covered Lt long finger nail)               OT Short Term Goals - 08/10/18 1153      OT SHORT TERM GOAL #1   Title  Pt will be Mod  I splinting use, care and precautions    Baseline  vc's required and min assist to apply on fingers    Time  4    Period  Weeks    Status  Achieved      OT SHORT TERM GOAL #2   Title  Pt will be Mod I initial HEP Left hand/fingers    Baseline  Min VC's and tc's required    Time  4    Period  Weeks    Status  Achieved      OT SHORT TERM GOAL #3   Title  Pt will indepdendently verbalize dressing changes &  possible signs and symptoms of infection    Baseline  Min VC's required    Time  4    Period  Weeks    Status  Achieved      OT SHORT TERM GOAL #4   Title  Pt will be Mod I edema control techniques LUE    Baseline  required Min vc's to implement    Time  4    Period  Weeks    Status  Achieved        OT Long Term Goals - 09/12/18 1306      OT LONG TERM GOAL #1   Title  Pt will be Mod I updated HEP L hand/fingers    Baseline  dependent d/t current precautions    Time  8    Period  Weeks    Status  New      OT LONG TERM GOAL #2   Title  Pt will demonstrate functional A/ROM L hand as seen by ability to make flat fist    Baseline  dependent    Time  8    Period  Weeks    Status  On-going   approx 75% towards flat fist     OT LONG TERM GOAL #3   Title  Pt will be Mod I scar management L hand/fingers    Baseline  dependent    Time  8    Period  Weeks    Status  Achieved      OT LONG TERM GOAL #4   Title  Pt will report use of left hand for light household duties as non-dominant hand    Baseline  limited d/t current precautions  Time  8    Period  Weeks    Status  On-going      OT LONG TERM GOAL #5   Title  Pt will report pain left hand as 3/10 or less during ADL's and light functional activities in household setting.    Baseline  07/26/18 pain = 6/10    Time  8    Period  Weeks    Status  Not Met   Pt usually b/t 4-10/10     Long Term Additional Goals   Additional Long Term Goals  Yes      OT LONG TERM GOAL #6   Title  Pt to demo 5* or > improvement  in PIP flexion Lt index and long finger     Baseline  09/12/18: index = 85*, long = 70*    Time  6    Period  Weeks    Status  New      OT LONG TERM GOAL #7   Title  Pt to demo Lt grip strength to 40 lbs or greater to assist w/ opening tight jars/containers    Baseline  dependent d/t current precautions    Time  6    Period  Weeks    Status  New            Plan - 10/05/18 1520    Clinical Impression Statement  Pt progressing w/ w/ PIP motion. Pt responding well to estim    Occupational performance deficits (Please refer to evaluation for details):  ADL's;Rest and Sleep;Work;Leisure;IADL's    Rehab Potential  Good    OT Frequency  2x / week    OT Duration  6 weeks    OT Treatment/Interventions  Therapeutic exercise;Splinting;Patient/family education;Fluidtherapy;Scar mobilization;Therapeutic activities;Manual Therapy;Passive range of motion;Ultrasound;Paraffin;Self-care/ADL training    Plan  fluidotherapy, continue A/ROM, P/ROM and place and hold Lt hand, continue estim    Consulted and Agree with Plan of Care  Patient       Patient will benefit from skilled therapeutic intervention in order to improve the following deficits and impairments:  Decreased skin integrity, Increased edema, Impaired flexibility, Pain, Decreased coordination, Decreased mobility, Decreased scar mobility, Decreased activity tolerance, Decreased range of motion, Decreased strength, Decreased knowledge of precautions, Impaired UE functional use  Visit Diagnosis: Stiffness of left hand, not elsewhere classified  Pain in left hand    Problem List Patient Active Problem List   Diagnosis Date Noted  . Pain in right foot 03/30/2018  . Perennial and seasonal allergic rhinitis 11/09/2017  . History of food allergy 11/09/2017  . Wheezing 11/09/2017  . Dyspnea/wheezing 10/31/2017  . Cough 10/31/2017  . Bronchitis, mucopurulent recurrent ([facility]) 09/07/2017  . SVT (supraventricular tachycardia) ([facility])  04/23/2015  . Abdominal pain, epigastric 06/03/2013  . Unspecified vitamin D deficiency 04/09/2013  . Esophageal reflux 04/09/2013  . Hepatitis B carrier ([facility]) 04/09/2013  . Other and unspecified hyperlipidemia 04/09/2013  . Unspecified sleep apnea 04/09/2013    Carey Bullocks, OTR/L 10/05/2018, 3:28 PM  [facility] [address] [place], [place], [address] Phone: [phone]   Fax:  [phone]  Name: WYLAND RASTETTER MRN: [id number] Date of Birth: 1955-09-28

## 2018-10-06 ENCOUNTER — Telehealth: Payer: Self-pay

## 2018-10-06 NOTE — Telephone Encounter (Signed)
-----   Message from Mathews Robinsons sent at 10/06/2018 10:10 AM EST ----- Regarding: medication questions Hi Boneta Lucks, Pt walked in this morn requesting an appt with [name] re: medications. I have scheduled his late to follow up (due Feb.) on April 8th. He is asking for a call to discuss his medications. Thanks  renee

## 2018-10-06 NOTE — Progress Notes (Signed)
   HPI: 63 year old male presenting today for follow up evaluation of bilateral foot pain. He states the pain has not changed. He notes a burning sensation throughout the dorsal and plantar aspects of the feet. There are no modifying factors noted. Patient is here for further evaluation and treatment.   Past Medical History:  Diagnosis Date  . DDD (degenerative disc disease), lumbar    L3/L4  . Diverticulosis   . GERD (gastroesophageal reflux disease)   . Hyperlipidemia   . Low sperm motility   . Pre-diabetes   . Sleep apnea    uses cpap   . SVT (supraventricular tachycardia) (HCC)      Physical Exam: General: The patient is alert and oriented x3 in no acute distress.  Dermatology: Skin is warm, dry and supple bilateral lower extremities. Negative for open lesions or macerations.  Vascular: Palpable pedal pulses bilaterally. No edema or erythema noted. Capillary refill within normal limits.  Neurological: Epicritic and protective threshold grossly intact bilaterally. Burning sensation with ambulation noted to the feet bilaterally.   Musculoskeletal Exam: Pain with palpation to the 1st MPJ bilaterally. Range of motion within normal limits to all pedal and ankle joints bilateral. Muscle strength 5/5 in all groups bilateral.   Assessment: 1. 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 Peripheral neuropathy BLE   Plan of Care:  1. Patient evaluated.  2. Prescription for Gabapentin 100 mg TID provided to patient.  3. Recommended good shoe gear.  4. Return to clinic in 4 weeks. We will take new X-Rays next visit.       Felecia Shelling, DPM [facility]  Dr. Felecia Shelling, DPM    [address] [place], [place] [address]                Office [phone]  Fax [phone]

## 2018-10-10 ENCOUNTER — Ambulatory Visit: Payer: Medicaid Other | Admitting: Occupational Therapy

## 2018-10-10 DIAGNOSIS — M25642 Stiffness of left hand, not elsewhere classified: Secondary | ICD-10-CM

## 2018-10-10 DIAGNOSIS — M25542 Pain in joints of left hand: Secondary | ICD-10-CM

## 2018-10-10 DIAGNOSIS — M79642 Pain in left hand: Secondary | ICD-10-CM | POA: Diagnosis not present

## 2018-10-10 NOTE — Therapy (Signed)
[facility] [address] [place], [place], [address] Phone: [phone]   Fax:  [phone]  Occupational Therapy Treatment  Patient Details  Name: Richard Walker MRN: [id number] Date of Birth: 1955/09/30 Referring Provider (OT): Dr Grandville Silos   Encounter Date: 10/10/2018  OT End of Session - 10/10/18 0902    Visit Number  13    Number of Visits  19    Date for OT Re-Evaluation  11/06/18    Authorization Type  MCD - 3 visits approved through 12/30 /19,  3 visits approved 08/24/18 - 09/13/18    Authorization Time Period  additional 12 visits approved 09/22/18-11/02/2018    Authorization - Visit Number  12    Authorization - Number of Visits  18    OT Start Time  0850    OT Stop Time  0935    OT Time Calculation (min)  45 min    Activity Tolerance  Patient limited by pain;Patient tolerated treatment well    Behavior During Therapy  [facility] for tasks assessed/performed       Past Medical History:  Diagnosis Date  . DDD (degenerative disc disease), lumbar    L3/L4  . Diverticulosis   . GERD (gastroesophageal reflux disease)   . Hyperlipidemia   . Low sperm motility   . Pre-diabetes   . Sleep apnea    uses cpap   . SVT (supraventricular tachycardia) (HCC)     Past Surgical History:  Procedure Laterality Date  . 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 REPAIR OF LEFT INDEX AND LONG FINGER BY FUSION /  REPAIR OF RING FINGER;  Surgeon: Milly Jakob, MD;  Location: [facility];  Service: Orthopedics;  Laterality: Left;  . BACK SURGERY    . CARDIAC CATHETERIZATION N/A 12/22/2015   Procedure: Left Heart Cath and Coronary Angiography;  Surgeon: Peter M Martinique, MD;  Location: [facility];  Service: Cardiovascular;  Laterality: N/A;  . CARDIAC ELECTROPHYSIOLOGY STUDY AND ABLATION    . ELECTROPHYSIOLOGIC STUDY N/A 02/19/2016   Procedure: SVT Ablation;  Surgeon: Evans Lance, MD;  Location: [facility];  Service:  Cardiovascular;  Laterality: N/A;  . septal reconstruction  2017  . TURBINATE REDUCTION Bilateral 07/2016  . WISDOM TOOTH EXTRACTION      There were no vitals filed for this visit.  Subjective Assessment - 10/10/18 0857    Pertinent History  surgery 07/15/18    Limitations  Pt arrived with orders from Dr. Grandville Silos that he may begin  putty ex, he must continue to wear his splints and active movement is not expected at DIP joints digits 2,3    Patient Stated Goals  Decreased pain, get use of hand back    Currently in Pain?  Yes    Pain Score  --   fluctuates   Pain Location  --   index and long fingers   Pain Orientation  Left    Pain Descriptors / Indicators  Aching;Sore    Pain Type  Acute pain    Pain Onset  More than a month ago    Pain Frequency  Intermittent    Aggravating Factors   P/ROM    Pain Relieving Factors  Rest, heat                   OT Treatments/Exercises (OP) - 10/10/18 0001      Hand Exercises   Other Hand Exercises  A/ROM, place and hold and  P/ROM as tolerated to index, long, and ring fingers for PIP flexion while blocking MP joints in extension (and mallet finger splints on index and long fingers); then composite flexion/ext. with empasis on extending MP's first, then PIP's       Electrical Stimulation   Electrical Stimulation Location  volar forearm    Electrical Stimulation Action  Finger flexion    Electrical Stimulation Parameters  50 pps, 250 pw, 10 sec. on/off cycle, int = 15, x 10 min. total    Electrical Stimulation Goals  --   ROM     LUE Fluidotherapy   Number Minutes Fluidotherapy  12 Minutes    LUE Fluidotherapy Location  Hand    Comments  at beginning of session to decr stiffness (covered Lt long fingernail)               OT Short Term Goals - 08/10/18 1153      OT SHORT TERM GOAL #1   Title  Pt will be Mod I splinting use, care and precautions    Baseline  vc's required and min assist to apply on fingers    Time   4    Period  Weeks    Status  Achieved      OT SHORT TERM GOAL #2   Title  Pt will be Mod I initial HEP Left hand/fingers    Baseline  Min VC's and tc's required    Time  4    Period  Weeks    Status  Achieved      OT SHORT TERM GOAL #3   Title  Pt will indepdendently verbalize dressing changes &  possible signs and symptoms of infection    Baseline  Min VC's required    Time  4    Period  Weeks    Status  Achieved      OT SHORT TERM GOAL #4   Title  Pt will be Mod I edema control techniques LUE    Baseline  required Min vc's to implement    Time  4    Period  Weeks    Status  Achieved        OT Long Term Goals - 09/12/18 1306      OT LONG TERM GOAL #1   Title  Pt will be Mod I updated HEP L hand/fingers    Baseline  dependent d/t current precautions    Time  8    Period  Weeks    Status  New      OT LONG TERM GOAL #2   Title  Pt will demonstrate functional A/ROM L hand as seen by ability to make flat fist    Baseline  dependent    Time  8    Period  Weeks    Status  On-going   approx 75% towards flat fist     OT LONG TERM GOAL #3   Title  Pt will be Mod I scar management L hand/fingers    Baseline  dependent    Time  8    Period  Weeks    Status  Achieved      OT LONG TERM GOAL #4   Title  Pt will report use of left hand for light household duties as non-dominant hand    Baseline  limited d/t current precautions    Time  8    Period  Weeks    Status  On-going      OT LONG  TERM GOAL #5   Title  Pt will report pain left hand as 3/10 or less during ADL's and light functional activities in household setting.    Baseline  07/26/18 pain = 6/10    Time  8    Period  Weeks    Status  Not Met   Pt usually b/t 4-10/10     Long Term Additional Goals   Additional Long Term Goals  Yes      OT LONG TERM GOAL #6   Title  Pt to demo 5* or > improvement in PIP flexion Lt index and long finger     Baseline  09/12/18: index = 85*, long = 70*    Time  6    Period   Weeks    Status  New      OT LONG TERM GOAL #7   Title  Pt to demo Lt grip strength to 40 lbs or greater to assist w/ opening tight jars/containers    Baseline  dependent d/t current precautions    Time  6    Period  Weeks    Status  New            Plan - 10/10/18 5638    Clinical Impression Statement  Pt progressing w/ w/ PIP motion. Pt responding well to estim    Occupational performance deficits (Please refer to evaluation for details):  ADL's;Rest and Sleep;Work;Leisure;IADL's    Rehab Potential  Good    Current Impairments/barriers affecting progress:  Pain and open wounds L index, long and ring fingers    OT Frequency  2x / week    OT Duration  6 weeks    OT Treatment/Interventions  Therapeutic exercise;Splinting;Patient/family education;Fluidtherapy;Scar mobilization;Therapeutic activities;Manual Therapy;Passive range of motion;Ultrasound;Paraffin;Self-care/ADL training    Plan  fluidotherapy, continue A/ROM, P/ROM and place and hold Lt hand, continue estim    Consulted and Agree with Plan of Care  Patient       Patient will benefit from skilled therapeutic intervention in order to improve the following deficits and impairments:  Decreased skin integrity, Increased edema, Impaired flexibility, Pain, Decreased coordination, Decreased mobility, Decreased scar mobility, Decreased activity tolerance, Decreased range of motion, Decreased strength, Decreased knowledge of precautions, Impaired UE functional use  Visit Diagnosis: Stiffness of left hand, not elsewhere classified  Pain in joint of left hand    Problem List Patient Active Problem List   Diagnosis Date Noted  . Pain in right foot 03/30/2018  . Perennial and seasonal allergic rhinitis 11/09/2017  . History of food allergy 11/09/2017  . Wheezing 11/09/2017  . Dyspnea/wheezing 10/31/2017  . Cough 10/31/2017  . Bronchitis, mucopurulent recurrent ([place]) 09/07/2017  . SVT (supraventricular tachycardia) ([place])  04/23/2015  . Abdominal pain, epigastric 06/03/2013  . Unspecified vitamin D deficiency 04/09/2013  . Esophageal reflux 04/09/2013  . Hepatitis B carrier ([place]) 04/09/2013  . Other and unspecified hyperlipidemia 04/09/2013  . Unspecified sleep apnea 04/09/2013    Carey Bullocks, OTR/L 10/10/2018, 9:30 AM  [facility] [address] [place], [place], [address] Phone: [phone]   Fax:  [phone]  Name: Richard Walker MRN: [id number] Date of Birth: [birth year]

## 2018-10-12 ENCOUNTER — Ambulatory Visit: Payer: Medicaid Other | Admitting: Occupational Therapy

## 2018-10-12 DIAGNOSIS — M25642 Stiffness of left hand, not elsewhere classified: Secondary | ICD-10-CM

## 2018-10-12 DIAGNOSIS — M79642 Pain in left hand: Secondary | ICD-10-CM | POA: Diagnosis not present

## 2018-10-12 DIAGNOSIS — M25542 Pain in joints of left hand: Secondary | ICD-10-CM

## 2018-10-12 NOTE — Therapy (Signed)
[facility] [address] [place], [place], [address] Phone: [phone]   Fax:  [phone]  Occupational Therapy Treatment  Patient Details  Name: Richard Walker MRN: [id number] Date of Birth: 28-Aug-1955 Referring Provider (OT): Dr Grandville Silos   Encounter Date: 10/12/2018  OT End of Session - 10/12/18 1148    Visit Number  14    Number of Visits  19    Date for OT Re-Evaluation  11/06/18    Authorization Type  MCD - 3 visits approved through 12/30 /19,  3 visits approved 08/24/18 - 09/13/18    Authorization Time Period  additional 12 visits approved 09/22/18-11/02/2018    Authorization - Visit Number  13    Authorization - Number of Visits  18    OT Start Time  0867    OT Stop Time  1150    OT Time Calculation (min)  45 min    Activity Tolerance  Patient limited by pain;Patient tolerated treatment well    Behavior During Therapy  [facility] for tasks assessed/performed       Past Medical History:  Diagnosis Date  . DDD (degenerative disc disease), lumbar    L3/L4  . Diverticulosis   . GERD (gastroesophageal reflux disease)   . Hyperlipidemia   . Low sperm motility   . Pre-diabetes   . Sleep apnea    uses cpap   . SVT (supraventricular tachycardia) (HCC)     Past Surgical History:  Procedure Laterality Date  . 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 REPAIR OF LEFT INDEX AND LONG FINGER BY FUSION /  REPAIR OF RING FINGER;  Surgeon: Milly Jakob, MD;  Location: [facility];  Service: Orthopedics;  Laterality: Left;  . BACK SURGERY    . CARDIAC CATHETERIZATION N/A 12/22/2015   Procedure: Left Heart Cath and Coronary Angiography;  Surgeon: Peter M Martinique, MD;  Location: [facility];  Service: Cardiovascular;  Laterality: N/A;  . CARDIAC ELECTROPHYSIOLOGY STUDY AND ABLATION    . ELECTROPHYSIOLOGIC STUDY N/A 02/19/2016   Procedure: SVT Ablation;  Surgeon: Evans Lance, MD;  Location: [facility];  Service:  Cardiovascular;  Laterality: N/A;  . septal reconstruction  2017  . TURBINATE REDUCTION Bilateral 07/2016  . WISDOM TOOTH EXTRACTION      There were no vitals filed for this visit.  Subjective Assessment - 10/12/18 1117    Pertinent History  surgery 07/15/18    Limitations  Pt arrived with orders from Dr. Grandville Silos that he may begin  putty ex, he must continue to wear his splints and active movement is not expected at DIP joints digits 2,3    Patient Stated Goals  Decreased pain, get use of hand back    Currently in Pain?  Yes    Pain Score  2     Pain Location  --   index and long fingers   Pain Orientation  Left    Pain Descriptors / Indicators  Aching    Pain Type  Acute pain    Pain Onset  More than a month ago    Pain Frequency  Intermittent    Aggravating Factors   P/ROM    Pain Relieving Factors  Rest, heat                   OT Treatments/Exercises (OP) - 10/12/18 0001      Hand Exercises   Other Hand Exercises  A/ROM, place and hold and P/ROM  as tolerated to index, long, and ring fingers for PIP flexion while blocking MP joints in extension (and mallet finger splints on index and long fingers); then composite flexion/ext. with empasis on extending MP's first, then PIP's       Electrical Stimulation   Electrical Stimulation Location  volar forearm    Electrical Stimulation Action  finger flexion    Electrical Stimulation Parameters  50 pps, 250 pw, 10 sec. on/off cycle, x 10 min. total      LUE Fluidotherapy   Number Minutes Fluidotherapy  10 Minutes    LUE Fluidotherapy Location  Hand    Comments  at beginning of session to decr. stiffness                OT Short Term Goals - 08/10/18 1153      OT SHORT TERM GOAL #1   Title  Pt will be Mod I splinting use, care and precautions    Baseline  vc's required and min assist to apply on fingers    Time  4    Period  Weeks    Status  Achieved      OT SHORT TERM GOAL #2   Title  Pt will be Mod I  initial HEP Left hand/fingers    Baseline  Min VC's and tc's required    Time  4    Period  Weeks    Status  Achieved      OT SHORT TERM GOAL #3   Title  Pt will indepdendently verbalize dressing changes &  possible signs and symptoms of infection    Baseline  Min VC's required    Time  4    Period  Weeks    Status  Achieved      OT SHORT TERM GOAL #4   Title  Pt will be Mod I edema control techniques LUE    Baseline  required Min vc's to implement    Time  4    Period  Weeks    Status  Achieved        OT Long Term Goals - 09/12/18 1306      OT LONG TERM GOAL #1   Title  Pt will be Mod I updated HEP L hand/fingers    Baseline  dependent d/t current precautions    Time  8    Period  Weeks    Status  New      OT LONG TERM GOAL #2   Title  Pt will demonstrate functional A/ROM L hand as seen by ability to make flat fist    Baseline  dependent    Time  8    Period  Weeks    Status  On-going   approx 75% towards flat fist     OT LONG TERM GOAL #3   Title  Pt will be Mod I scar management L hand/fingers    Baseline  dependent    Time  8    Period  Weeks    Status  Achieved      OT LONG TERM GOAL #4   Title  Pt will report use of left hand for light household duties as non-dominant hand    Baseline  limited d/t current precautions    Time  8    Period  Weeks    Status  On-going      OT LONG TERM GOAL #5   Title  Pt will report pain left hand as 3/10 or less  during ADL's and light functional activities in household setting.    Baseline  07/26/18 pain = 6/10    Time  8    Period  Weeks    Status  Not Met   Pt usually b/t 4-10/10     Long Term Additional Goals   Additional Long Term Goals  Yes      OT LONG TERM GOAL #6   Title  Pt to demo 5* or > improvement in PIP flexion Lt index and long finger     Baseline  09/12/18: index = 85*, long = 70*    Time  6    Period  Weeks    Status  New      OT LONG TERM GOAL #7   Title  Pt to demo Lt grip strength to 40  lbs or greater to assist w/ opening tight jars/containers    Baseline  dependent d/t current precautions    Time  6    Period  Weeks    Status  New            Plan - 10/12/18 1329    Clinical Impression Statement  Pt progressing w/ w/ PIP motion and can achieve greater A/ROM    Occupational performance deficits (Please refer to evaluation for details):  ADL's;Rest and Sleep;Work;Leisure;IADL's    Rehab Potential  Good    Current Impairments/barriers affecting progress:  Pain and open wounds L index, long and ring fingers    OT Frequency  2x / week    OT Duration  6 weeks    OT Treatment/Interventions  Therapeutic exercise;Splinting;Patient/family education;Fluidtherapy;Scar mobilization;Therapeutic activities;Manual Therapy;Passive range of motion;Ultrasound;Paraffin;Self-care/ADL training    Plan  fluidotherapy, continue A/ROM, P/ROM and place and hold Lt hand, continue estim    Consulted and Agree with Plan of Care  Patient       Patient will benefit from skilled therapeutic intervention in order to improve the following deficits and impairments:  Decreased skin integrity, Increased edema, Impaired flexibility, Pain, Decreased coordination, Decreased mobility, Decreased scar mobility, Decreased activity tolerance, Decreased range of motion, Decreased strength, Decreased knowledge of precautions, Impaired UE functional use  Visit Diagnosis: Stiffness of left hand, not elsewhere classified  Pain in joint of left hand    Problem List Patient Active Problem List   Diagnosis Date Noted  . Pain in right foot 03/30/2018  . Perennial and seasonal allergic rhinitis 11/09/2017  . History of food allergy 11/09/2017  . Wheezing 11/09/2017  . Dyspnea/wheezing 10/31/2017  . Cough 10/31/2017  . Bronchitis, mucopurulent recurrent ([place]) 09/07/2017  . SVT (supraventricular tachycardia) ([place]) 04/23/2015  . Abdominal pain, epigastric 06/03/2013  . Unspecified vitamin D deficiency  04/09/2013  . Esophageal reflux 04/09/2013  . Hepatitis B carrier ([place]) 04/09/2013  . Other and unspecified hyperlipidemia 04/09/2013  . Unspecified sleep apnea 04/09/2013    Carey Bullocks, OTR/L 10/12/2018, 1:31 PM  [facility] [address] [place], [place], [address] Phone: [phone]   Fax:  [phone]  Name: Richard Walker MRN: [id number] Date of Birth: 27-May-1956

## 2018-10-16 ENCOUNTER — Encounter: Payer: Self-pay | Admitting: *Deleted

## 2018-10-16 ENCOUNTER — Ambulatory Visit: Payer: Medicaid Other | Admitting: *Deleted

## 2018-10-16 ENCOUNTER — Other Ambulatory Visit: Payer: Self-pay

## 2018-10-16 DIAGNOSIS — M25642 Stiffness of left hand, not elsewhere classified: Secondary | ICD-10-CM

## 2018-10-16 DIAGNOSIS — M79642 Pain in left hand: Secondary | ICD-10-CM | POA: Diagnosis not present

## 2018-10-16 DIAGNOSIS — M25542 Pain in joints of left hand: Secondary | ICD-10-CM

## 2018-10-16 DIAGNOSIS — R278 Other lack of coordination: Secondary | ICD-10-CM

## 2018-10-16 NOTE — Therapy (Signed)
[facility] [address] [place], [place], [address] Phone: [phone]   Fax:  [phone]  Occupational Therapy Treatment  Patient Details  Name: Richard Walker MRN: [id number] Date of Birth: 07-01-1956 Referring Provider (OT): Dr Grandville Silos   Encounter Date: 10/16/2018  OT End of Session - 10/16/18 0826    Visit Number  15    Number of Visits  19    Date for OT Re-Evaluation  11/06/18    Authorization Type  MCD - 3 visits approved through 12/30 /19,  3 visits approved 08/24/18 - 09/13/18    Authorization Time Period  additional 12 visits approved 09/22/18-11/02/2018    Authorization - Visit Number  14    Authorization - Number of Visits  18    OT Start Time  5638    OT Stop Time  0841    OT Time Calculation (min)  44 min    Activity Tolerance  Patient tolerated treatment well    Behavior During Therapy  [facility] for tasks assessed/performed       Past Medical History:  Diagnosis Date  . DDD (degenerative disc disease), lumbar    L3/L4  . Diverticulosis   . GERD (gastroesophageal reflux disease)   . Hyperlipidemia   . Low sperm motility   . Pre-diabetes   . Sleep apnea    uses cpap   . SVT (supraventricular tachycardia) (HCC)     Past Surgical History:  Procedure Laterality Date  . 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 REPAIR OF LEFT INDEX AND LONG FINGER BY FUSION /  REPAIR OF RING FINGER;  Surgeon: Milly Jakob, MD;  Location: [facility];  Service: Orthopedics;  Laterality: Left;  . BACK SURGERY    . CARDIAC CATHETERIZATION N/A 12/22/2015   Procedure: Left Heart Cath and Coronary Angiography;  Surgeon: Peter M Martinique, MD;  Location: [facility];  Service: Cardiovascular;  Laterality: N/A;  . CARDIAC ELECTROPHYSIOLOGY STUDY AND ABLATION    . ELECTROPHYSIOLOGIC STUDY N/A 02/19/2016   Procedure: SVT Ablation;  Surgeon: Evans Lance, MD;  Location: [facility];  Service: Cardiovascular;  Laterality:  N/A;  . septal reconstruction  2017  . TURBINATE REDUCTION Bilateral 07/2016  . WISDOM TOOTH EXTRACTION      There were no vitals filed for this visit.  Subjective Assessment - 10/16/18 0807    Subjective   Pt reports pain and paresthesias in index and long finger at times at home (not currently while in clinic) that can only be relieved "by rubbing it".     Pertinent History  surgery 07/15/18    Limitations  10/12/2018 Pt arrived with orders from Dr. Grandville Silos that he may begin  putty ex, he must continue to wear his splints and active movement is not expected at DIP joints digits 2,3    Patient Stated Goals  Decreased pain, get use of hand back    Currently in Pain?  Yes    Pain Score  2     Pain Location  --   index and long fingers   Pain Orientation  Left    Pain Descriptors / Indicators  Aching    Pain Type  Acute pain    Pain Onset  More than a month ago    Pain Frequency  Intermittent    Aggravating Factors   P/ROM    Pain Relieving Factors  Rest, heat, Fluidotherapy        OT Treatments/Exercises (OP) -  10/16/18 0001      Hand Exercises   Other Hand Exercises  A/ROM, place and hold and P/ROM as tolerated to index, long, and ring fingers for PIP flexion while blocking MP joints in extension (and mallet finger splints on index and long fingers); then composite flexion/ext. with empasis on extending MP's first, then PIP's     Other Hand Exercises  Gentle exercise with stress ball/putty left hand with splints on DIP joints during ex's.       Acupuncturist Location  --   Defer E-stim today pt states "I don't want to do that today"     LUE Fluidotherapy   Number Minutes Fluidotherapy  10 Minutes    LUE Fluidotherapy Location  Hand    Comments  At beginning of session to decrease stiffness.      Manual Therapy   Manual Therapy  Edema management;Other (comment)   Scar management/desensitization & pt ed   Manual therapy comments  --   8  min        OT Education - 10/16/18 0825    Education Details  desensitization techniques and scar massage PRN     Person(s) Educated  Patient    Methods  Explanation;Demonstration;Handout    Comprehension  Verbalized understanding;Returned demonstration       OT Short Term Goals - 08/10/18 1153      OT SHORT TERM GOAL #1   Title  Pt will be Mod I splinting use, care and precautions    Baseline  vc's required and min assist to apply on fingers    Time  4    Period  Weeks    Status  Achieved      OT SHORT TERM GOAL #2   Title  Pt will be Mod I initial HEP Left hand/fingers    Baseline  Min VC's and tc's required    Time  4    Period  Weeks    Status  Achieved      OT SHORT TERM GOAL #3   Title  Pt will indepdendently verbalize dressing changes &  possible signs and symptoms of infection    Baseline  Min VC's required    Time  4    Period  Weeks    Status  Achieved      OT SHORT TERM GOAL #4   Title  Pt will be Mod I edema control techniques LUE    Baseline  required Min vc's to implement    Time  4    Period  Weeks    Status  Achieved        OT Long Term Goals - 09/12/18 1306      OT LONG TERM GOAL #1   Title  Pt will be Mod I updated HEP L hand/fingers    Baseline  dependent d/t current precautions    Time  8    Period  Weeks    Status  New      OT LONG TERM GOAL #2   Title  Pt will demonstrate functional A/ROM L hand as seen by ability to make flat fist    Baseline  dependent    Time  8    Period  Weeks    Status  On-going   approx 75% towards flat fist     OT LONG TERM GOAL #3   Title  Pt will be Mod I scar management L hand/fingers    Baseline  dependent  Time  8    Period  Weeks    Status  Achieved      OT LONG TERM GOAL #4   Title  Pt will report use of left hand for light household duties as non-dominant hand    Baseline  limited d/t current precautions    Time  8    Period  Weeks    Status  On-going      OT LONG TERM GOAL #5    Title  Pt will report pain left hand as 3/10 or less during ADL's and light functional activities in household setting.    Baseline  07/26/18 pain = 6/10    Time  8    Period  Weeks    Status  Not Met   Pt usually b/t 4-10/10     Long Term Additional Goals   Additional Long Term Goals  Yes      OT LONG TERM GOAL #6   Title  Pt to demo 5* or > improvement in PIP flexion Lt index and long finger     Baseline  09/12/18: index = 85*, long = 70*    Time  6    Period  Weeks    Status  New      OT LONG TERM GOAL #7   Title  Pt to demo Lt grip strength to 40 lbs or greater to assist w/ opening tight jars/containers    Baseline  dependent d/t current precautions    Time  6    Period  Weeks    Status  New         Plan - 10/16/18 5329    Clinical Impression Statement  Pt hould benefit from desensitization and edem amanagement. Consider noc exten splint for L LF secondary to slight extensor lag at PIP observed.    Occupational performance deficits (Please refer to evaluation for details):  ADL's;Rest and Sleep;Work;Leisure;IADL's    Rehab Potential  Good    Current Impairments/barriers affecting progress:  Pain and open wounds L index, long and ring fingers    OT Frequency  2x / week    OT Duration  6 weeks    OT Treatment/Interventions  Therapeutic exercise;Splinting;Patient/family education;Fluidtherapy;Scar mobilization;Therapeutic activities;Manual Therapy;Passive range of motion;Ultrasound;Paraffin;Self-care/ADL training    Plan  fluidotherapy, continue A/ROM, P/ROM and place and hold Lt hand, consider noc exten splint LLF, continue estim    Clinical Decision Making  Several treatment options, min-mod task modification necessary    Consulted and Agree with Plan of Care  Patient       Patient will benefit from skilled therapeutic intervention in order to improve the following deficits and impairments:  Decreased skin integrity, Increased edema, Impaired flexibility, Pain, Decreased  coordination, Decreased mobility, Decreased scar mobility, Decreased activity tolerance, Decreased range of motion, Decreased strength, Decreased knowledge of precautions, Impaired UE functional use  Visit Diagnosis: Stiffness of left hand, not elsewhere classified  Pain in joint of left hand  Pain in left hand  Other lack of coordination    Problem List Patient Active Problem List   Diagnosis Date Noted  . Pain in right foot 03/30/2018  . Perennial and seasonal allergic rhinitis 11/09/2017  . History of food allergy 11/09/2017  . Wheezing 11/09/2017  . Dyspnea/wheezing 10/31/2017  . Cough 10/31/2017  . Bronchitis, mucopurulent recurrent ([facility]) 09/07/2017  . SVT (supraventricular tachycardia) ([place]) 04/23/2015  . Abdominal pain, epigastric 06/03/2013  . Unspecified vitamin D deficiency 04/09/2013  . Esophageal reflux 04/09/2013  .  Hepatitis B carrier ([place]) 04/09/2013  . Other and unspecified hyperlipidemia 04/09/2013  . Unspecified sleep apnea 04/09/2013    Almyra Deforest, OTR/L 10/16/2018, 8:45 AM  [facility] [address] [place], [place], [address] Phone: [phone]   Fax:  [phone]  Name: Richard Walker MRN: [id number] Date of Birth: 07-Jul-1956

## 2018-10-17 ENCOUNTER — Ambulatory Visit: Payer: Medicaid Other | Admitting: Occupational Therapy

## 2018-10-17 ENCOUNTER — Ambulatory Visit: Payer: Medicaid Other | Admitting: Internal Medicine

## 2018-10-17 ENCOUNTER — Encounter: Payer: Self-pay | Admitting: Internal Medicine

## 2018-10-17 VITALS — BP 128/80 | HR 66 | Ht 68.0 in | Wt 261.2 lb

## 2018-10-17 DIAGNOSIS — I480 Paroxysmal atrial fibrillation: Secondary | ICD-10-CM | POA: Diagnosis not present

## 2018-10-17 DIAGNOSIS — I471 Supraventricular tachycardia: Secondary | ICD-10-CM

## 2018-10-17 MED ORDER — FLECAINIDE ACETATE 150 MG PO TABS: 150.0000 mg | ORAL_TABLET | Freq: Two times a day (BID) | ORAL | 11 refills | Status: DC

## 2018-10-17 NOTE — Patient Instructions (Addendum)
Medication Instructions:  Your physician recommends that you continue on your current medications as directed. Please refer to the Current Medication list given to you today.  Labwork: None ordered.  Testing/Procedures: Your physician has requested that you have an echocardiogram. Echocardiography is a painless test that uses sound waves to create images of your heart. It provides your doctor with information about the size and shape of your heart and how well your heart's chambers and valves are working. This procedure takes approximately one hour. There are no restrictions for this procedure.  Please schedule for an ECHO  Follow-Up: Your physician wants you to follow-up in: 6 months with [name].   You will receive a reminder letter in the mail two months in advance. If you don't receive a letter, please call our office to schedule the follow-up appointment.   Any Other Special Instructions Will Be Listed Below (If Applicable).  If you need a refill on your cardiac medications before your next appointment, please call your pharmacy.

## 2018-10-17 NOTE — Progress Notes (Signed)
HPI [name] returns today for followup of his PAF. He is a pleasant, obese middle aged man with a h/o SVT who underwent ablation but then developed PAF. He has been on increasing doses of flecainide along with his beta blocker. He has an app on his I phone that monitors his HR. The patient has had documented recurrent episodes of atrial fib. He feels palpitations but denies chest pain. Since I saw him last, he nearly cut 3 fingers off of his hand with a power saw. He continues to gain weight up almost 10 lbs in 2 months. Allergies  Allergen Reactions  . Tree Extract   . Pork-Derived Products Swelling  . Statins     Muscle aches     Current Outpatient Medications  Medication Sig Dispense Refill  . acetaminophen (TYLENOL) 325 MG tablet Take 2 tablets (650 mg total) by mouth every 6 (six) hours.    [name] albuterol (PROAIR HFA) 108 (90 Base) MCG/ACT inhaler Inhale 1-2 puffs into the lungs every 6 (six) hours as needed for wheezing or shortness of breath. 1 Inhaler 0  . amoxicillin-clavulanate (AUGMENTIN) 875-125 MG tablet Take 1 tablet by mouth 2 (two) times daily. 20 tablet 0  . aspirin 81 MG tablet Take 81 mg by mouth daily.    [name] azelastine (ASTELIN) 0.1 % nasal spray 1-2 sprays per nostril 2 times daily as needed. 30 mL 5  . budesonide-formoterol (SYMBICORT) 160-4.5 MCG/ACT inhaler Inhale 2 puffs into the lungs 2 (two) times daily. 1 Inhaler 0  . ezetimibe (ZETIA) 10 MG tablet Take 1 tablet (10 mg total) by mouth daily. 30 tablet 11  . famotidine-calcium carbonate-magnesium hydroxide (PEPCID COMPLETE) 10-800-165 MG chewable tablet Chew by mouth.    . flecainide (TAMBOCOR) 50 MG tablet TAKE 1 TABLET BY MOUTH AS NEEDED FOR PALPITATIONS DESPITE TAKING METOPROLOL. NOT TO EXCEED 6 TABLETS IN 24 HOURS 45 tablet 3  . fluticasone (FLONASE) 50 MCG/ACT nasal spray Place 1 spray into both nostrils daily. 16 g 0  . gabapentin (NEURONTIN) 100 MG capsule Take 1 capsule (100 mg total) by mouth 3  (three) times daily. 90 capsule 1  . HYDROcodone-acetaminophen (NORCO) 5-325 MG tablet Take 1 tablet by mouth every 6 (six) hours as needed for moderate pain. 6 tablet 0  . HYDROcodone-acetaminophen (NORCO/VICODIN) 5-325 MG tablet Take 1-2 tablets by mouth every 6 (six) hours as needed for moderate pain or severe pain. 20 tablet 0  . ibuprofen (ADVIL) 200 MG tablet Take 3 tablets (600 mg total) by mouth every 6 (six) hours.    [name] levocetirizine (XYZAL) 5 MG tablet TAKE 1 TABLET(5 MG) BY MOUTH EVERY EVENING 90 tablet 0  . meloxicam (MOBIC) 15 MG tablet TK 1 T PO QD  1  . metFORMIN (GLUCOPHAGE) 500 MG tablet Take 1 tablet (500 mg total) by mouth 2 (two) times daily with a meal. 60 tablet 3  . metoprolol tartrate (LOPRESSOR) 25 MG tablet TAKE 1 TABLET(25 MG) BY MOUTH TWICE DAILY (Patient taking differently: No sig reported) 180 tablet 2  . montelukast (SINGULAIR) 10 MG tablet Take 1 tablet once at night for coughing or wheezing. 30 tablet 1  . pantoprazole (PROTONIX) 40 MG tablet Take 1 tablet (40 mg total) by mouth 2 (two) times daily. 60 tablet 0  . PRESCRIPTION MEDICATION Inhale into the lungs at bedtime. CPAP    . sildenafil (REVATIO) 20 MG tablet Take 1-2 tablets (20-40 mg total) by mouth as needed. 60 tablet 1  .  Vitamin D, Ergocalciferol, (DRISDOL) 1.25 MG (50000 UT) CAPS capsule Take 1 capsule (50,000 Units total) by mouth every 7 (seven) days. 4 capsule 0   No current facility-administered medications for this visit.      Past Medical History:  Diagnosis Date  . DDD (degenerative disc disease), lumbar    L3/L4  . Diverticulosis   . GERD (gastroesophageal reflux disease)   . Hyperlipidemia   . Low sperm motility   . Pre-diabetes   . Sleep apnea    uses cpap   . SVT (supraventricular tachycardia) (HCC)     ROS:   All systems reviewed and negative except as noted in the HPI.   Past Surgical History:  Procedure Laterality Date  . 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  REPAIR OF LEFT INDEX AND LONG FINGER BY FUSION /  REPAIR OF RING FINGER;  Surgeon: Mack Hook, MD;  Location: [facility];  Service: Orthopedics;  Laterality: Left;  . BACK SURGERY    . CARDIAC CATHETERIZATION N/A 12/22/2015   Procedure: Left Heart Cath and Coronary Angiography;  Surgeon: Peter M Swaziland, MD;  Location: [facility] INV[facility]IVE CV LAB;  Service: Cardiovascular;  Laterality: N/A;  . CARDIAC ELECTROPHYSIOLOGY STUDY AND ABLATION    . ELECTROPHYSIOLOGIC STUDY N/A 02/19/2016   Procedure: SVT Ablation;  Surgeon: Marinus Maw, MD;  Location: [facility] INVASIVE CV LAB;  Service: Cardiovascular;  Laterality: N/A;  . septal reconstruction  2017  . TURBINATE REDUCTION Bilateral 07/2016  . WISDOM TOOTH EXTRACTION       Family History  Problem Relation Age of Onset  . Hypertension Father   . Food Allergy Son   . Colon cancer Neg Hx   . Stomach cancer Neg Hx   . Allergic rhinitis Neg Hx   . Angioedema Neg Hx   . Asthma Neg Hx   . Eczema Neg Hx   . Immunodeficiency Neg Hx   . Urticaria Neg Hx      Social History   Socioeconomic History  . Marital status: Married    Spouse name: Not on file  . Number of children: Not on file  . Years of education: Not on file  . Highest education level: Not on file  Occupational History  . Not on file  Social Needs  . Financial resource strain: Not on file  . Food insecurity:    Worry: Not on file    Inability: Not on file  . Transportation needs:    Medical: Not on file    Non-medical: Not on file  Tobacco Use  . Smoking status: Former Smoker    Packs/day: 1.00    Years: 10.00    Pack years: 10.00    Types: Cigarettes    Last attempt to quit: 1992    Years since quitting: 28.1  . Smokeless tobacco: Never Used  . Tobacco comment: Uses Hookah Pipe occasionally  Substance and Sexual Activity  . Alcohol use: No  . Drug use: No  . Sexual activity: Yes  Lifestyle  . Physical activity:    Days per week: Not on file    Minutes per session: Not on  file  . Stress: Not on file  Relationships  . Social connections:    Talks on phone: Not on file    Gets together: Not on file    Attends religious service: Not on file    Active member of club or organization: Not on file    Attends meetings of clubs or organizations: Not  on file    Relationship status: Not on file  . Intimate partner violence:    Fear of current or ex partner: Not on file    Emotionally abused: Not on file    Physically abused: Not on file    Forced sexual activity: Not on file  Other Topics Concern  . Not on file  Social History Narrative   Marital Status: Married Engineer, drilling)   Children:  4 (3 Sons/1 Daughter)   Pets:  None    Living Situation: Lives with spouse and 4 children   Origin:  He was born in [place].   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     Alcohol Use:  None   Drug Use:  None   Diet:  Regular   Exercise:  None   Hobbies:  Cards     BP 128/80   Pulse 66   Ht 5\' 8"  (1.727 m)   Wt 261 lb 3.2 oz (118.5 kg)   SpO2 96%   BMI 39.72 kg/m   Physical Exam:  Well appearing NAD HEENT: Unremarkable Neck:  No JVD, no thyromegally Lymphatics:  No adenopathy Back:  No CVA tenderness Lungs:  Clear HEART:  Regular rate rhythm, no murmurs, no rubs, no clicks Abd:  soft, positive bowel sounds, no organomegally, no rebound, no guarding Ext:  2 plus pulses, no edema, no cyanosis, no clubbing Skin:  No rashes no nodules Neuro:  CN II through XII intact, motor grossly intact  EKG - nsr   Assess/Plan: 1. PAF - his symptoms are still not well controlled. We discussed uptitration of his flecainide as well as initiation of dofetilide. For now he would like to try increasing the flecainide. He has been encouraged to lose weight. 2. Obesity - I have discussed the importance of weight loss on the treatment of atrial  fib. 3. HTN - his bp is high. I have asked that he take his beta blocker twice daily 4. Dyslipidemia - Continue zetia and lose weight  Leonia Reeves.D.

## 2018-10-18 ENCOUNTER — Ambulatory Visit: Payer: Medicaid Other | Admitting: Occupational Therapy

## 2018-10-18 DIAGNOSIS — M25542 Pain in joints of left hand: Secondary | ICD-10-CM

## 2018-10-18 DIAGNOSIS — M79642 Pain in left hand: Secondary | ICD-10-CM

## 2018-10-18 DIAGNOSIS — M25642 Stiffness of left hand, not elsewhere classified: Secondary | ICD-10-CM

## 2018-10-18 NOTE — Therapy (Signed)
[facility] [address] [place], [place], [address] Phone: [phone]   Fax:  [phone]  Occupational Therapy Treatment  Patient Details  Name: Richard Walker MRN: [id number] Date of Birth: 05/18/1956 Referring Provider (OT): Dr Grandville Silos   Encounter Date: 10/18/2018  OT End of Session - 10/18/18 0932    Visit Number  16    Number of Visits  19    Date for OT Re-Evaluation  11/06/18    Authorization Type  MCD - 3 visits approved through 12/30 /19,  3 visits approved 08/24/18 - 09/13/18    Authorization Time Period  additional 12 visits approved 09/22/18-11/02/2018    Authorization - Visit Number  15    Authorization - Number of Visits  18    OT Start Time  0850    OT Stop Time  0930    OT Time Calculation (min)  40 min    Activity Tolerance  Patient tolerated treatment well    Behavior During Therapy  [facility] for tasks assessed/performed       Past Medical History:  Diagnosis Date  . DDD (degenerative disc disease), lumbar    L3/L4  . Diverticulosis   . GERD (gastroesophageal reflux disease)   . Hyperlipidemia   . Low sperm motility   . Pre-diabetes   . Sleep apnea    uses cpap   . SVT (supraventricular tachycardia) (HCC)     Past Surgical History:  Procedure Laterality Date  . 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 REPAIR OF LEFT INDEX AND LONG FINGER BY FUSION /  REPAIR OF RING FINGER;  Surgeon: Milly Jakob, MD;  Location: [facility];  Service: Orthopedics;  Laterality: Left;  . BACK SURGERY    . CARDIAC CATHETERIZATION N/A 12/22/2015   Procedure: Left Heart Cath and Coronary Angiography;  Surgeon: Peter M Martinique, MD;  Location: [place] [facility];  Service: Cardiovascular;  Laterality: N/A;  . CARDIAC ELECTROPHYSIOLOGY STUDY AND ABLATION    . ELECTROPHYSIOLOGIC STUDY N/A 02/19/2016   Procedure: SVT Ablation;  Surgeon: Evans Lance, MD;  Location: [place] [facility];  Service: Cardiovascular;  Laterality:  N/A;  . septal reconstruction  2017  . TURBINATE REDUCTION Bilateral 07/2016  . WISDOM TOOTH EXTRACTION      There were no vitals filed for this visit.  Subjective Assessment - 10/18/18 0903    Subjective   I don't want to do the estim until I talk to my cardiologist. I've been having some heart flutters. Also, more pain at my fingertips (Lt index and long finger) if my splints are off; rubbing it helps    Pertinent History  surgery 07/15/18    Limitations  10/12/2018 Pt arrived with orders from Dr. Grandville Silos that he may begin  putty ex, he must continue to wear his splints and active movement is not expected at DIP joints digits 2,3    Currently in Pain?  Yes    Pain Score  5    up to 9/10 upon arrival   Pain Location  --   index and long fingers   Pain Orientation  Left    Pain Descriptors / Indicators  Burning    Pain Type  Acute pain    Pain Onset  More than a month ago    Pain Frequency  Intermittent    Aggravating Factors   splints off    Pain Relieving Factors  heat (fluido), massage  [facility] OT Assessment - 10/18/18 0001      Hand Function   Left Hand Grip (lbs)  10 lbs               OT Treatments/Exercises (OP) - 10/18/18 0001      ADLs   ADL Comments  NO estim until patient speaks w/ cardiologist. Pt is concerned w/ recent symptoms (pt does NOT have pacemaker or defibrillator). Pt also w/ increased pain in index and long finger DIP joints this week when splints are off - massage and heat help decr pain. Provided new stockinette and re-wrapped mallet finger splints today      Hand Exercises   Other Hand Exercises  A/ROM, place and hold and P/ROM as tolerated to index, long, and ring fingers for PIP flexion while blocking MP joints in extension (and mallet finger splints on index and long fingers); then composite flexion/ext. with empasis on extending MP's first, then PIP's     Other Hand Exercises  Gripper set at level 1 resistance to pick up blocks for  sustained grip strength Lt hand w/ min difficulty and drops (mallet finger splints on)      LUE Fluidotherapy   Number Minutes Fluidotherapy  12 Minutes    LUE Fluidotherapy Location  Hand    Comments  to decr pain/stiffness      Called placed to Dr. Grandville Silos re: nodule felt over volar distal phalanx long finger, future POC and MCD visit limit, and if pm volar splint to assist w/ long finger PIP extensor lag would help and still protect DIP joint sufficiently. Awaiting call back         OT Short Term Goals - 08/10/18 1153      OT SHORT TERM GOAL #1   Title  Pt will be Mod I splinting use, care and precautions    Baseline  vc's required and min assist to apply on fingers    Time  4    Period  Weeks    Status  Achieved      OT SHORT TERM GOAL #2   Title  Pt will be Mod I initial HEP Left hand/fingers    Baseline  Min VC's and tc's required    Time  4    Period  Weeks    Status  Achieved      OT SHORT TERM GOAL #3   Title  Pt will indepdendently verbalize dressing changes &  possible signs and symptoms of infection    Baseline  Min VC's required    Time  4    Period  Weeks    Status  Achieved      OT SHORT TERM GOAL #4   Title  Pt will be Mod I edema control techniques LUE    Baseline  required Min vc's to implement    Time  4    Period  Weeks    Status  Achieved        OT Long Term Goals - 09/12/18 1306      OT LONG TERM GOAL #1   Title  Pt will be Mod I updated HEP L hand/fingers    Baseline  dependent d/t current precautions    Time  8    Period  Weeks    Status  New      OT LONG TERM GOAL #2   Title  Pt will demonstrate functional A/ROM L hand as seen by ability to make flat fist  Baseline  dependent    Time  8    Period  Weeks    Status  On-going   approx 75% towards flat fist     OT LONG TERM GOAL #3   Title  Pt will be Mod I scar management L hand/fingers    Baseline  dependent    Time  8    Period  Weeks    Status  Achieved      OT LONG  TERM GOAL #4   Title  Pt will report use of left hand for light household duties as non-dominant hand    Baseline  limited d/t current precautions    Time  8    Period  Weeks    Status  On-going      OT LONG TERM GOAL #5   Title  Pt will report pain left hand as 3/10 or less during ADL's and light functional activities in household setting.    Baseline  07/26/18 pain = 6/10    Time  8    Period  Weeks    Status  Not Met   Pt usually b/t 4-10/10     Long Term Additional Goals   Additional Long Term Goals  Yes      OT LONG TERM GOAL #6   Title  Pt to demo 5* or > improvement in PIP flexion Lt index and long finger     Baseline  09/12/18: index = 85*, long = 70*    Time  6    Period  Weeks    Status  New      OT LONG TERM GOAL #7   Title  Pt to demo Lt grip strength to 40 lbs or greater to assist w/ opening tight jars/containers    Baseline  dependent d/t current precautions    Time  6    Period  Weeks    Status  New            Plan - 10/18/18 1043    Clinical Impression Statement  Pt w/ increased pain this week and sensitivity at distal phalanges index and long fingers. Encouraged pt to continue scar massage as this helps.     Occupational performance deficits (Please refer to evaluation for details):  ADL's;Rest and Sleep;Work;Leisure;IADL's    Rehab Potential  Good    Current Impairments/barriers affecting progress:  Pain and open wounds L index, long and ring fingers    OT Frequency  2x / week    OT Duration  6 weeks    OT Treatment/Interventions  Therapeutic exercise;Splinting;Patient/family education;Fluidtherapy;Scar mobilization;Therapeutic activities;Manual Therapy;Passive range of motion;Ultrasound;Paraffin;Self-care/ADL training    Plan  fluidotherapy, continue A/ROM, P/ROM and place and hold Lt hand, consider noc exten splint LLF, Grip strength Lt hand    Consulted and Agree with Plan of Care  Patient       Patient will benefit from skilled therapeutic  intervention in order to improve the following deficits and impairments:  Decreased skin integrity, Increased edema, Impaired flexibility, Pain, Decreased coordination, Decreased mobility, Decreased scar mobility, Decreased activity tolerance, Decreased range of motion, Decreased strength, Decreased knowledge of precautions, Impaired UE functional use  Visit Diagnosis: Pain in joint of left hand  Pain in left hand  Stiffness of left hand, not elsewhere classified    Problem List Patient Active Problem List   Diagnosis Date Noted  . Pain in right foot 03/30/2018  . Perennial and seasonal allergic rhinitis 11/09/2017  . History of food  allergy 11/09/2017  . Wheezing 11/09/2017  . Dyspnea/wheezing 10/31/2017  . Cough 10/31/2017  . Bronchitis, mucopurulent recurrent ([place]) 09/07/2017  . SVT (supraventricular tachycardia) ([place]) 04/23/2015  . Abdominal pain, epigastric 06/03/2013  . Unspecified vitamin D deficiency 04/09/2013  . Esophageal reflux 04/09/2013  . Hepatitis B carrier ([place]) 04/09/2013  . Other and unspecified hyperlipidemia 04/09/2013  . Unspecified sleep apnea 04/09/2013    Carey Bullocks, OTR/L 10/18/2018, 10:45 AM  [facility] [address] [place], [place], [address] Phone: [phone]   Fax:  [phone]  Name: SHANDY VI MRN: [id number] Date of Birth: 1955-12-12

## 2018-10-23 ENCOUNTER — Telehealth: Payer: Self-pay | Admitting: Medical

## 2018-10-23 ENCOUNTER — Encounter: Payer: Self-pay | Admitting: Medical

## 2018-10-23 DIAGNOSIS — R739 Hyperglycemia, unspecified: Secondary | ICD-10-CM

## 2018-10-23 DIAGNOSIS — E785 Hyperlipidemia, unspecified: Secondary | ICD-10-CM

## 2018-10-23 NOTE — Telephone Encounter (Signed)
Future labs placed. Will send pt message by my chart and have him schedule appointment. He sent me message asking for labs to be ordered.

## 2018-10-24 ENCOUNTER — Ambulatory Visit: Payer: Medicaid Other | Attending: Medical | Admitting: Occupational Therapy

## 2018-10-24 DIAGNOSIS — M79642 Pain in left hand: Secondary | ICD-10-CM | POA: Insufficient documentation

## 2018-10-24 DIAGNOSIS — M25642 Stiffness of left hand, not elsewhere classified: Secondary | ICD-10-CM | POA: Diagnosis not present

## 2018-10-24 DIAGNOSIS — R278 Other lack of coordination: Secondary | ICD-10-CM | POA: Diagnosis present

## 2018-10-24 DIAGNOSIS — M25542 Pain in joints of left hand: Secondary | ICD-10-CM | POA: Diagnosis present

## 2018-10-24 NOTE — Therapy (Signed)
[facility] [address] [place], [place], [address] Phone: [phone]   Fax:  [phone]  Occupational Therapy Treatment  Patient Details  Name: Richard Walker MRN: [id number] Date of Birth: [birth year] Referring Provider (OT): Dr Grandville Silos   Encounter Date: 10/24/2018  OT End of Session - 10/24/18 1255    Visit Number  17    Number of Visits  19    Date for OT Re-Evaluation  11/06/18    Authorization Type  MCD - 3 visits approved through 12/30 /19,  3 visits approved 08/24/18 - 09/13/18    Authorization Time Period  additional 12 visits approved 09/22/18-11/02/2018    Authorization - Visit Number  16    Authorization - Number of Visits  18    OT Start Time  1150    OT Stop Time  1230    OT Time Calculation (min)  40 min    Activity Tolerance  Patient tolerated treatment well    Behavior During Therapy  [facility] for tasks assessed/performed       Past Medical History:  Diagnosis Date  . DDD (degenerative disc disease), lumbar    L3/L4  . Diverticulosis   . GERD (gastroesophageal reflux disease)   . Hyperlipidemia   . Low sperm motility   . Pre-diabetes   . Sleep apnea    uses cpap   . SVT (supraventricular tachycardia) (HCC)     Past Surgical History:  Procedure Laterality Date  . 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 REPAIR OF LEFT INDEX AND LONG FINGER BY FUSION /  REPAIR OF RING FINGER;  Surgeon: Milly Jakob, MD;  Location: [facility];  Service: Orthopedics;  Laterality: Left;  . BACK SURGERY    . CARDIAC CATHETERIZATION N/A 12/22/2015   Procedure: Left Heart Cath and Coronary Angiography;  Surgeon: Peter M Martinique, MD;  Location: [place] [facility];  Service: Cardiovascular;  Laterality: N/A;  . CARDIAC ELECTROPHYSIOLOGY STUDY AND ABLATION    . ELECTROPHYSIOLOGIC STUDY N/A 02/19/2016   Procedure: SVT Ablation;  Surgeon: Evans Lance, MD;  Location: [facility];  Service: Cardiovascular;  Laterality:  N/A;  . septal reconstruction  2017  . TURBINATE REDUCTION Bilateral 07/2016  . WISDOM TOOTH EXTRACTION      There were no vitals filed for this visit.  Subjective Assessment - 10/24/18 1159    Subjective   Did you speak w/ the doctor?     Pertinent History  surgery 07/15/18    Limitations  10/12/2018 Pt arrived with orders from Dr. Grandville Silos that he may begin  putty ex, he must continue to wear his splints and active movement is not expected at DIP joints digits 2,3    Patient Stated Goals  Decreased pain, get use of hand back    Currently in Pain?  No/denies       Therapist had spoken with orthopedic MD about pm splint for PIP extension long finger and if this would be sufficient to support DIP joint if splint only on volar side. MD said this was ok. MD also informed of possible nodule distal phalanx of long finger and of MCD visit limit and POC from here. Discussed w/ patient today scheduling remaining approved appointments, then placing on hold until after next MD appointment and returning 1x/wk for 4 weeks (which will save some MCD visits for later in year if needed) - pt agreeable to this.   Fabricated and fitted hand based pm splint for  Lt long finger PIP extension. Pt instructed in wear and care and verbalized understanding. Pt instructed that he should wear at night, but still needs mallet finger splint for Lt index finger at night. Then to wear both mallet finger splints (for index and long finger) during the day. Pt understood. Issued splint                      OT Short Term Goals - 08/10/18 1153      OT SHORT TERM GOAL #1   Title  Pt will be Mod I splinting use, care and precautions    Baseline  vc's required and min assist to apply on fingers    Time  4    Period  Weeks    Status  Achieved      OT SHORT TERM GOAL #2   Title  Pt will be Mod I initial HEP Left hand/fingers    Baseline  Min VC's and tc's required    Time  4    Period  Weeks    Status   Achieved      OT SHORT TERM GOAL #3   Title  Pt will indepdendently verbalize dressing changes &  possible signs and symptoms of infection    Baseline  Min VC's required    Time  4    Period  Weeks    Status  Achieved      OT SHORT TERM GOAL #4   Title  Pt will be Mod I edema control techniques LUE    Baseline  required Min vc's to implement    Time  4    Period  Weeks    Status  Achieved        OT Long Term Goals - 09/12/18 1306      OT LONG TERM GOAL #1   Title  Pt will be Mod I updated HEP L hand/fingers    Baseline  dependent d/t current precautions    Time  8    Period  Weeks    Status  New      OT LONG TERM GOAL #2   Title  Pt will demonstrate functional A/ROM L hand as seen by ability to make flat fist    Baseline  dependent    Time  8    Period  Weeks    Status  On-going   approx 75% towards flat fist     OT LONG TERM GOAL #3   Title  Pt will be Mod I scar management L hand/fingers    Baseline  dependent    Time  8    Period  Weeks    Status  Achieved      OT LONG TERM GOAL #4   Title  Pt will report use of left hand for light household duties as non-dominant hand    Baseline  limited d/t current precautions    Time  8    Period  Weeks    Status  On-going      OT LONG TERM GOAL #5   Title  Pt will report pain left hand as 3/10 or less during ADL's and light functional activities in household setting.    Baseline  07/26/18 pain = 6/10    Time  8    Period  Weeks    Status  Not Met   Pt usually b/t 4-10/10     Long Term Additional Goals   Additional Long Term Goals  Yes      OT LONG TERM GOAL #6   Title  Pt to demo 5* or > improvement in PIP flexion Lt index and long finger     Baseline  09/12/18: index = 85*, long = 70*    Time  6    Period  Weeks    Status  New      OT LONG TERM GOAL #7   Title  Pt to demo Lt grip strength to 40 lbs or greater to assist w/ opening tight jars/containers    Baseline  dependent d/t current precautions    Time   6    Period  Weeks    Status  New            Plan - 10/24/18 1256    Clinical Impression Statement  Pt tolerating splint and verbalizes understanding w/ wear and care    Occupational performance deficits (Please refer to evaluation for details):  ADL's;Rest and Sleep;Work;Leisure;IADL's    Rehab Potential  Good    OT Frequency  2x / week    OT Duration  6 weeks    OT Treatment/Interventions  Therapeutic exercise;Splinting;Patient/family education;Fluidtherapy;Scar mobilization;Therapeutic activities;Manual Therapy;Passive range of motion;Ultrasound;Paraffin;Self-care/ADL training    Plan  continue fluido, A/ROM, P/ROM, place and hold PIP joints w/ mallet finger splints on, assess pm splint, grip strength Lt hand (anticipate placing on hold after next week then returning week of 11/13/18 for 1x/wk for 4 weeks - resubmission to MCD required)        Patient will benefit from skilled therapeutic intervention in order to improve the following deficits and impairments:     Visit Diagnosis: Stiffness of left hand, not elsewhere classified    Problem List Patient Active Problem List   Diagnosis Date Noted  . Pain in right foot 03/30/2018  . Perennial and seasonal allergic rhinitis 11/09/2017  . History of food allergy 11/09/2017  . Wheezing 11/09/2017  . Dyspnea/wheezing 10/31/2017  . Cough 10/31/2017  . Bronchitis, mucopurulent recurrent ([place]) 09/07/2017  . SVT (supraventricular tachycardia) ([place]) 04/23/2015  . Abdominal pain, epigastric 06/03/2013  . Unspecified vitamin D deficiency 04/09/2013  . Esophageal reflux 04/09/2013  . Hepatitis B carrier ([place]) 04/09/2013  . Other and unspecified hyperlipidemia 04/09/2013  . Unspecified sleep apnea 04/09/2013    Carey Bullocks, OTR/L 10/24/2018, 12:58 PM  [facility] [address] [place], [place], [address] Phone: [phone]   Fax:  [phone]  Name: Richard Walker MRN: [id number] Date of Birth: [birth year]

## 2018-10-26 ENCOUNTER — Ambulatory Visit: Payer: Medicaid Other | Admitting: Occupational Therapy

## 2018-10-30 ENCOUNTER — Encounter: Payer: Self-pay | Admitting: Podiatry

## 2018-10-30 ENCOUNTER — Ambulatory Visit (INDEPENDENT_AMBULATORY_CARE_PROVIDER_SITE_OTHER): Payer: Medicaid Other

## 2018-10-30 ENCOUNTER — Ambulatory Visit: Payer: Medicaid Other | Admitting: *Deleted

## 2018-10-30 ENCOUNTER — Other Ambulatory Visit: Payer: Self-pay

## 2018-10-30 ENCOUNTER — Encounter: Payer: Self-pay | Admitting: *Deleted

## 2018-10-30 ENCOUNTER — Ambulatory Visit: Payer: Medicaid Other | Admitting: Podiatry

## 2018-10-30 DIAGNOSIS — M7752 Other enthesopathy of left foot: Secondary | ICD-10-CM | POA: Diagnosis not present

## 2018-10-30 DIAGNOSIS — M7751 Other enthesopathy of right foot: Secondary | ICD-10-CM | POA: Diagnosis not present

## 2018-10-30 DIAGNOSIS — M79642 Pain in left hand: Secondary | ICD-10-CM

## 2018-10-30 DIAGNOSIS — M25642 Stiffness of left hand, not elsewhere classified: Secondary | ICD-10-CM | POA: Diagnosis not present

## 2018-10-30 DIAGNOSIS — M25542 Pain in joints of left hand: Secondary | ICD-10-CM

## 2018-10-30 DIAGNOSIS — R278 Other lack of coordination: Secondary | ICD-10-CM

## 2018-10-30 MED ORDER — MELOXICAM 15 MG PO TABS: 15.0000 mg | ORAL_TABLET | Freq: Every day | ORAL | 1 refills | Status: DC

## 2018-10-30 NOTE — Therapy (Signed)
[facility] [address] [place], [place], [address] Phone: [phone]   Fax:  [phone]  Occupational Therapy Treatment  Patient Details  Name: Richard Walker MRN: [id number] Date of Birth: 1956/02/19 Referring Provider (OT): Dr Grandville Silos   Encounter Date: 10/30/2018  OT End of Session - 10/30/18 0923    Visit Number  18    Number of Visits  19    Date for OT Re-Evaluation  11/06/18    Authorization Type  MCD - 3 visits approved through 12/30 /19,  3 visits approved 08/24/18 - 09/13/18    Authorization Time Period  additional 12 visits approved 09/22/18-11/02/2018    Authorization - Visit Number  17    Authorization - Number of Visits  18    OT Start Time  0845    OT Stop Time  0926    OT Time Calculation (min)  41 min    Activity Tolerance  Patient tolerated treatment well    Behavior During Therapy  [facility] for tasks assessed/performed       Past Medical History:  Diagnosis Date  . DDD (degenerative disc disease), lumbar    L3/L4  . Diverticulosis   . GERD (gastroesophageal reflux disease)   . Hyperlipidemia   . Low sperm motility   . Pre-diabetes   . Sleep apnea    uses cpap   . SVT (supraventricular tachycardia) (HCC)     Past Surgical History:  Procedure Laterality Date  . 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 REPAIR OF LEFT INDEX AND LONG FINGER BY FUSION /  REPAIR OF RING FINGER;  Surgeon: Milly Jakob, MD;  Location: [place];  Service: Orthopedics;  Laterality: Left;  . BACK SURGERY    . CARDIAC CATHETERIZATION N/A 12/22/2015   Procedure: Left Heart Cath and Coronary Angiography;  Surgeon: Peter M Martinique, MD;  Location: [place] [facility];  Service: Cardiovascular;  Laterality: N/A;  . CARDIAC ELECTROPHYSIOLOGY STUDY AND ABLATION    . ELECTROPHYSIOLOGIC STUDY N/A 02/19/2016   Procedure: SVT Ablation;  Surgeon: Evans Lance, MD;  Location: [place] [facility];  Service: Cardiovascular;  Laterality:  N/A;  . septal reconstruction  2017  . TURBINATE REDUCTION Bilateral 07/2016  . WISDOM TOOTH EXTRACTION      There were no vitals filed for this visit.  Subjective Assessment - 10/30/18 0851    Subjective   Pt reports that he has f/u wit hDr Grandville Silos on 11/08/2018. Denies any changes, but with some reports of IF sensitivity at distal DIP/Pin area is sensitive "If I hit it, I did the other day".    Pertinent History  surgery 07/15/18    Limitations  10/12/2018 Pt arrived with orders from Dr. Grandville Silos that he may begin  putty ex, he must continue to wear his splints and active movement is not expected at DIP joints digits 2,3    Patient Stated Goals  Decreased pain, get use of hand back    Currently in Pain?  No/denies    Pain Score  0-No pain         OT Treatments/Exercises (OP) - 10/30/18 0001      ADLs   ADL Comments  Pt reports getting some assistance for certain AL's especially activities that require grip/lifting - spouse and children assist PRN. Pt wears mallett splints on left IF/MF at all times except in shower and reports that he is independent with bathing/showering at this time.      Exercises  Exercises  Hand      Hand Exercises   Other Hand Exercises  A/ROM, place and hold and P/ROM as tolerated to index, long, and ring fingers for PIP flexion while blocking MP joints in extension (and mallet finger splints on index and long fingers); then composite flexion/ext. with empasis on extending MP's first, then PIP's     Other Hand Exercises  Gripper set at level 1 resistance to pick up blocks for sustained grip strength Lt hand w/ min difficulty and drops (mallet finger splints on). Mallett finger splints on for stress ball gentle grip and place and hold exercises left hand..       Modalities   Modalities  Fluidotherapy      LUE Fluidotherapy   Number Minutes Fluidotherapy  12 Minutes    LUE Fluidotherapy Location  Hand    Comments  To decrease pain/stiffness, increase  flexibility and ROM of MCP/PIP joints L hand.       Splinting   Splinting  Splint use verbally reviewed (volar DID extension splints for Noc use, checked. No changes.      Manual Therapy   Manual Therapy  Edema management;Joint mobilization   Verbal review of edema control and edema management  L       OT Education - 10/30/18 0920    Education Details  Verbal review desensitization techniques and scar massage PRN, use of splints    Person(s) Educated  Patient    Methods  Explanation;Demonstration;Handout    Comprehension  Verbalized understanding;Returned demonstration       OT Short Term Goals - 08/10/18 1153      OT SHORT TERM GOAL #1   Title  Pt will be Mod I splinting use, care and precautions    Baseline  vc's required and min assist to apply on fingers    Time  4    Period  Weeks    Status  Achieved      OT SHORT TERM GOAL #2   Title  Pt will be Mod I initial HEP Left hand/fingers    Baseline  Min VC's and tc's required    Time  4    Period  Weeks    Status  Achieved      OT SHORT TERM GOAL #3   Title  Pt will indepdendently verbalize dressing changes &  possible signs and symptoms of infection    Baseline  Min VC's required    Time  4    Period  Weeks    Status  Achieved      OT SHORT TERM GOAL #4   Title  Pt will be Mod I edema control techniques LUE    Baseline  required Min vc's to implement    Time  4    Period  Weeks    Status  Achieved        OT Long Term Goals - 09/12/18 1306      OT LONG TERM GOAL #1   Title  Pt will be Mod I updated HEP L hand/fingers    Baseline  dependent d/t current precautions    Time  8    Period  Weeks    Status  New      OT LONG TERM GOAL #2   Title  Pt will demonstrate functional A/ROM L hand as seen by ability to make flat fist    Baseline  dependent    Time  8    Period  Weeks  Status  On-going   approx 75% towards flat fist     OT LONG TERM GOAL #3   Title  Pt will be Mod I scar management L  hand/fingers    Baseline  dependent    Time  8    Period  Weeks    Status  Achieved      OT LONG TERM GOAL #4   Title  Pt will report use of left hand for light household duties as non-dominant hand    Baseline  limited d/t current precautions    Time  8    Period  Weeks    Status  On-going      OT LONG TERM GOAL #5   Title  Pt will report pain left hand as 3/10 or less during ADL's and light functional activities in household setting.    Baseline  07/26/18 pain = 6/10    Time  8    Period  Weeks    Status  Not Met   Pt usually b/t 4-10/10     Long Term Additional Goals   Additional Long Term Goals  Yes      OT LONG TERM GOAL #6   Title  Pt to demo 5* or > improvement in PIP flexion Lt index and long finger     Baseline  09/12/18: index = 85*, long = 70*    Time  6    Period  Weeks    Status  New      OT LONG TERM GOAL #7   Title  Pt to demo Lt grip strength to 40 lbs or greater to assist w/ opening tight jars/containers    Baseline  dependent d/t current precautions    Time  6    Period  Weeks    Status  New            Plan - 10/30/18 9622    Clinical Impression Statement  Pt tolerating splint and demonstrates excellent understanding of home program and plan of care. He should benefit from splint check and adjustment next visit.    Occupational performance deficits (Please refer to evaluation for details):  ADL's;Rest and Sleep;Work;Leisure;IADL's    Rehab Potential  Good    Clinical Decision Making  Several treatment options, min-mod task modification necessary    OT Frequency  2x / week    OT Duration  6 weeks    OT Treatment/Interventions  Therapeutic exercise;Splinting;Patient/family education;Fluidtherapy;Scar mobilization;Therapeutic activities;Manual Therapy;Passive range of motion;Ultrasound;Paraffin;Self-care/ADL training    Plan  continue fluido, A/ROM, P/ROM, place and hold PIP joints w/ mallet finger splints on, Check pm splint, grip strength Lt hand  (anticipate placing on hold after next week then returning week of 11/13/18 for 1x/wk for 4 weeks - resubmission to MCD required)     Recommended Other Services  F/U with Dr Grandville Silos 11/08/2018 as planned    Consulted and Agree with Plan of Care  Patient       Patient will benefit from skilled therapeutic intervention in order to improve the following deficits and impairments:     Visit Diagnosis: Stiffness of left hand, not elsewhere classified  Pain in joint of left hand  Pain in left hand  Other lack of coordination    Problem List Patient Active Problem List   Diagnosis Date Noted  . Pain in right foot 03/30/2018  . Perennial and seasonal allergic rhinitis 11/09/2017  . History of food allergy 11/09/2017  . Wheezing 11/09/2017  . Dyspnea/wheezing  10/31/2017  . Cough 10/31/2017  . Bronchitis, mucopurulent recurrent ([place]) 09/07/2017  . SVT (supraventricular tachycardia) ([place]) 04/23/2015  . Abdominal pain, epigastric 06/03/2013  . Unspecified vitamin D deficiency 04/09/2013  . Esophageal reflux 04/09/2013  . Hepatitis B carrier ([place]) 04/09/2013  . Other and unspecified hyperlipidemia 04/09/2013  . Unspecified sleep apnea 04/09/2013    Almyra Deforest, OTR/L 10/30/2018, 9:32 AM  [facility] [address] [place], [place], [address] Phone: [phone]   Fax:  [phone]  Name: Richard Walker MRN: [id number] Date of Birth: Oct 26, 1955

## 2018-10-31 ENCOUNTER — Ambulatory Visit (HOSPITAL_COMMUNITY): Payer: Medicaid Other | Attending: Cardiovascular Disease

## 2018-10-31 DIAGNOSIS — I480 Paroxysmal atrial fibrillation: Secondary | ICD-10-CM | POA: Insufficient documentation

## 2018-11-01 ENCOUNTER — Encounter: Payer: Self-pay | Admitting: *Deleted

## 2018-11-01 ENCOUNTER — Ambulatory Visit: Payer: Medicaid Other | Admitting: *Deleted

## 2018-11-01 DIAGNOSIS — M25642 Stiffness of left hand, not elsewhere classified: Secondary | ICD-10-CM

## 2018-11-01 DIAGNOSIS — R278 Other lack of coordination: Secondary | ICD-10-CM

## 2018-11-01 DIAGNOSIS — M25542 Pain in joints of left hand: Secondary | ICD-10-CM

## 2018-11-01 DIAGNOSIS — M79642 Pain in left hand: Secondary | ICD-10-CM

## 2018-11-01 NOTE — Therapy (Signed)
[facility] [address] [place], [place], [address] Phone: [phone]   Fax:  [phone]  Occupational Therapy Treatment  Patient Details  Name: Richard Walker MRN: [id number] Date of Birth: [birth year] Referring Provider (OT): Dr Grandville Silos   Encounter Date: 11/01/2018  OT End of Session - 11/01/18 1405    Visit Number  19    Number of Visits  19    Date for OT Re-Evaluation  11/06/18    Authorization Type  MCD - 3 visits approved through 12/30 /19,  3 visits approved 08/24/18 - 09/13/18    Authorization Time Period  additional 12 visits approved 09/22/18-11/02/2018. Pt on hold (as of 11/01/2018) until after 11/08/2018 Dr Appt, then request 4 more medicaid visits over 4 weeks (Pt not to be seen until week of 11/13/2018 after MD appointment))    Authorization - Visit Number  18   Requesting 4 additional visits after f/u w/ MD (4 visits over 4 weeks)   Authorization - Number of Visits  18    OT Start Time  0845    OT Stop Time  0927    OT Time Calculation (min)  42 min    Activity Tolerance  Patient tolerated treatment well    Behavior During Therapy  [facility] for tasks assessed/performed       Past Medical History:  Diagnosis Date  . DDD (degenerative disc disease), lumbar    L3/L4  . Diverticulosis   . GERD (gastroesophageal reflux disease)   . Hyperlipidemia   . Low sperm motility   . Pre-diabetes   . Sleep apnea    uses cpap   . SVT (supraventricular tachycardia) (HCC)     Past Surgical History:  Procedure Laterality Date  . 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 REPAIR OF LEFT INDEX AND LONG FINGER BY FUSION /  REPAIR OF RING FINGER;  Surgeon: Milly Jakob, MD;  Location: [place];  Service: Orthopedics;  Laterality: Left;  . BACK SURGERY    . CARDIAC CATHETERIZATION N/A 12/22/2015   Procedure: Left Heart Cath and Coronary Angiography;  Surgeon: Peter M Martinique, MD;  Location: [place] [facility];  Service:  Cardiovascular;  Laterality: N/A;  . CARDIAC ELECTROPHYSIOLOGY STUDY AND ABLATION    . ELECTROPHYSIOLOGIC STUDY N/A 02/19/2016   Procedure: SVT Ablation;  Surgeon: Evans Lance, MD;  Location: [place] [facility];  Service: Cardiovascular;  Laterality: N/A;  . septal reconstruction  2017  . TURBINATE REDUCTION Bilateral 07/2016  . WISDOM TOOTH EXTRACTION      There were no vitals filed for this visit.  Subjective Assessment - 11/01/18 0855    Subjective   Pt presents with Noc Finger extension splint on and requires some minor adjustments to MF piece. Pt reports pain as 3/10 in left hand. Pt with F/U w/ MD 11/08/2018.    Pertinent History  surgery 07/15/18    Limitations  10/12/2018 Pt arrived with orders from Dr. Grandville Silos that he may begin  putty ex, he must continue to wear his splints and active movement is not expected at DIP joints digits 2,3    Patient Stated Goals  Decreased pain, get use of hand back    Currently in Pain?  Yes    Pain Score  3     Pain Location  Finger (Comment which one)   Index & Long finger   Pain Orientation  Left    Pain Descriptors / Indicators  Discomfort;Burning  Pain Type  Acute pain    Pain Onset  More than a month ago    Pain Frequency  Intermittent    Aggravating Factors   Splints off    Pain Relieving Factors  Heat (Fluido), Massage    Multiple Pain Sites  No         [facility] OT Assessment - 11/01/18 0001      Left Hand PROM   L Index PIP 0-100  87 Degrees   After place and hold ex's   L Long PIP 0-100  74 Degrees   After place and hold ex's     Hand Function   Left Hand Grip (lbs)  12#   W/ Mallett splints on,no DIP flex. Grip w/ RF/Sm only              OT Treatments/Exercises (OP) - 11/01/18 0001      Exercises   Exercises  Hand      Hand Exercises   Other Hand Exercises  A/ROM, place and hold and P/ROM as tolerated to index, long, and ring fingers for PIP flexion while blocking MP joints in extension (and mallet finger  splints on index and long fingers); then composite flexion/ext. with empasis on extending MP's first, then PIP's     Other Hand Exercises  Mallett splints on for all exercises IF/LF. Stressed holding exercies for greater stretch, as pt appears to rush at times.      Modalities   Modalities  Fluidotherapy      LUE Fluidotherapy   Number Minutes Fluidotherapy  10 Minutes    LUE Fluidotherapy Location  Hand    Comments  To decrease pain/stiffness, increase flexibility and ROM of MCP/PIP joints L hand.       Splinting   Splinting  Splint check and adjustments to Noc MF exten splint (rolled back thenar emminence and portion of volar MF just distal to MCP). Added padding to PIP/DIP area with focus on PIP extension and no significant pressue on DIP wihle in splint. Pt verbalized and demonstrated undertainding of this as observed in clinic today.             OT Education - 11/01/18 1404    Education Details  Cont HEP, hold ex's longer for greater stretch and increased flexibility. Scar massage nad use of splints. Pt to go on hold until after he f/u w/ MD week of 11/08/2018. Will return for visits week of 11/13/2018 if madicaid approves.    Person(s) Educated  Patient    Methods  Explanation;Demonstration;Handout    Comprehension  Verbalized understanding;Returned demonstration       OT Short Term Goals - 11/01/18 1416      OT SHORT TERM GOAL #1   Title  Pt will be Mod I splinting use, care and precautions        OT Long Term Goals - 11/01/18 1416      OT LONG TERM GOAL #1   Title  Pt will be Mod I updated HEP L hand/fingers    Baseline  09/12/2018 dependent d/t current precautions; 11/01/18 vc's and ocassional tc's    Time  8    Period  Weeks    Status  On-going      OT LONG TERM GOAL #2   Title  Pt will demonstrate functional A/ROM L hand as seen by ability to make flat fist    Baseline  09/12/18 dependent; 11/01/2018 Improving, able to make flat fist after place and hold during  therapy session.    Time  8    Period  Weeks    Status  On-going   Approximately 90% flat fist after ex's     OT LONG TERM GOAL #3   Title  Pt will be Mod I scar management L hand/fingers    Baseline  09/12/18 dependent; 11/01/18 achieved/met    Time  8    Period  Weeks    Status  Achieved      OT LONG TERM GOAL #4   Title  Pt will report use of left hand for light household duties as non-dominant hand    Baseline  09/12/18 limited d/t current precautions. 11/01/18 Pt is limited d/t current precautions    Time  8    Period  Weeks    Status  On-going      OT LONG TERM GOAL #5   Title  Pt will report pain left hand as 3/10 or less during ADL's and light functional activities in household setting.    Baseline  07/26/18 pain = 6/10; 11/01/2018 3-4/10    Time  8    Period  Weeks    Status  On-going      OT LONG TERM GOAL #6   Title  Pt to demo 5* or > improvement in PIP flexion Lt index and long finger     Baseline  09/12/18: index = 85*, long = 70*     11/01/18 Index = 87* long = 74*    Time  6    Period  Weeks    Status  On-going      OT LONG TERM GOAL #7   Title  Pt to demo Lt grip strength to 40 lbs or greater to assist w/ opening tight jars/containers    Baseline  dependent d/t current precautions    Time  6    Period  Weeks    Status  On-going            Plan - 11/01/18 1409    Clinical Impression Statement  Pt demonstrating small steady gains in A/P ROM L digits with mallett splints on and understanding of his HEP. He should benefit from splint adjustments as performed in clinic today and will be placed on hold at this time until he has f/u Dr Grandville Silos on or around 11/08/2018. At that time, it is anticipated that his HEP can be upgraded therefore 4 additional visits from [facility] will be requested over 4 weeks (not to begin before week of 11/13/2018).    OT Occupational Profile and History  Problem Focused Assessment - Including review of records relating to presenting  problem    Occupational performance deficits (Please refer to evaluation for details):  ADL's;Rest and Sleep;Work;Leisure;IADL's    Rehab Potential  Good    Clinical Decision Making  Several treatment options, min-mod task modification necessary    OT Frequency  1x / week    OT Duration  4 weeks    OT Treatment/Interventions  Therapeutic exercise;Splinting;Patient/family education;Fluidtherapy;Scar mobilization;Therapeutic activities;Manual Therapy;Passive range of motion;Ultrasound;Paraffin;Self-care/ADL training    Plan  Pt on hold until f/u with Dr Grandville Silos week of 11/08/2018. Will upgrade HEP as able (Not before 11/13/2018). Currently requesting 4 additional visits beginning 11/13/2018 1x/week for 4 weeks.    Recommended Other Services  F/U with Dr Grandville Silos 11/08/2018 as planned    Consulted and Agree with Plan of Care  Patient       Patient will benefit from skilled therapeutic intervention in  order to improve the following deficits and impairments:     Visit Diagnosis: Stiffness of left hand, not elsewhere classified  Pain in joint of left hand  Pain in left hand  Other lack of coordination    Problem List Patient Active Problem List   Diagnosis Date Noted  . Pain in right foot 03/30/2018  . Perennial and seasonal allergic rhinitis 11/09/2017  . History of food allergy 11/09/2017  . Wheezing 11/09/2017  . Dyspnea/wheezing 10/31/2017  . Cough 10/31/2017  . Bronchitis, mucopurulent recurrent ([place]) 09/07/2017  . SVT (supraventricular tachycardia) ([place]) 04/23/2015  . Abdominal pain, epigastric 06/03/2013  . Unspecified vitamin D deficiency 04/09/2013  . Esophageal reflux 04/09/2013  . Hepatitis B carrier ([place]) 04/09/2013  . Other and unspecified hyperlipidemia 04/09/2013  . Unspecified sleep apnea 04/09/2013    Percell Miller Ardath Sax, OTR/L 11/01/2018, 2:30 PM  [facility] [address] [place], [place],  [address] Phone: [phone]   Fax:  [phone]  Name: Richard Walker MRN: [id number] Date of Birth: February 11, 1956

## 2018-11-02 NOTE — Progress Notes (Signed)
   HPI: 63 year old male presenting today for follow up evaluation of bilateral foot pain. He states the Gabapentin seems to be helping and the pain is improving. He denies any worsening factors. Patient is here for further evaluation and treatment.   Past Medical History:  Diagnosis Date  . DDD (degenerative disc disease), lumbar    L3/L4  . Diverticulosis   . GERD (gastroesophageal reflux disease)   . Hyperlipidemia   . Low sperm motility   . Pre-diabetes   . Sleep apnea    uses cpap   . SVT (supraventricular tachycardia) (HCC)      Physical Exam: General: The patient is alert and oriented x3 in no acute distress.  Dermatology: Skin is warm, dry and supple bilateral lower extremities. Negative for open lesions or macerations.  Vascular: Palpable pedal pulses bilaterally. No edema or erythema noted. Capillary refill within normal limits.  Neurological: Epicritic and protective threshold grossly intact bilaterally. Burning sensation with ambulation noted to the feet bilaterally.   Musculoskeletal Exam: Pain with palpation to the 1st MPJ bilaterally. Range of motion within normal limits to all pedal and ankle joints bilateral. Muscle strength 5/5 in all groups bilateral.   Radiographic Exam: Degenerative changes with joint space narrowing noted to the 1st MPJ of bilateral feet.  Assessment: 1. 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 Peripheral neuropathy BLE   Plan of Care:  1. Patient evaluated. X-Rays reviewed.  2. Prescription for Meloxicam provided to patient.  3. Continue taking Gabapentin as prescribed.  4. Recommended OTC insoles from [employer]. Custom orthotics are too expensive.  5. Return to clinic as needed.       Felecia Shelling, DPM [facility]  Dr. Felecia Shelling, DPM    [address] [place], [place] [address]                Office [phone]  Fax [phone]

## 2018-11-13 ENCOUNTER — Telehealth: Payer: Self-pay

## 2018-11-13 NOTE — Telephone Encounter (Signed)
Richard Walker was contacted today regarding the temporary closing of [facility] due to Covid-19.  Therapist discussed:  Clinic closing until at least 11/27/18, and next scheduled appointment on 11/29/18. Also, encouraged pt to continue exercises, and we will make every attempt to extend MCD end date as needed.  Patient is interested in further information for an e-visit, virtual check in, or telehealth visit, if those services become available.    [facility] will follow up with patients when we are able to resume care.  Jene Every, OTR/L [facility] [address] [place], [place]  [address] Phone:  [phone] Fax:  [phone] \

## 2018-11-15 ENCOUNTER — Ambulatory Visit: Payer: Medicaid Other | Admitting: Occupational Therapy

## 2018-11-22 ENCOUNTER — Encounter: Payer: Medicaid Other | Admitting: Occupational Therapy

## 2018-11-27 ENCOUNTER — Ambulatory Visit: Payer: Medicaid Other | Admitting: Podiatry

## 2018-11-29 ENCOUNTER — Ambulatory Visit: Payer: Medicaid Other | Admitting: Internal Medicine

## 2018-11-29 ENCOUNTER — Encounter: Payer: Medicaid Other | Admitting: Occupational Therapy

## 2018-12-06 ENCOUNTER — Ambulatory Visit: Payer: Medicaid Other | Admitting: Occupational Therapy

## 2018-12-13 ENCOUNTER — Telehealth: Payer: Self-pay | Admitting: *Deleted

## 2018-12-13 NOTE — Telephone Encounter (Signed)
Received request for Medical Records from [place] [employer]; forwarded to Medical Records via email/scan/SLS  

## 2018-12-27 ENCOUNTER — Ambulatory Visit: Payer: Medicaid Other | Admitting: Podiatry

## 2019-01-10 DIAGNOSIS — Z0271 Encounter for disability determination: Secondary | ICD-10-CM

## 2019-01-17 ENCOUNTER — Other Ambulatory Visit: Payer: Self-pay

## 2019-01-17 MED ORDER — MELOXICAM 15 MG PO TABS: 15.0000 mg | ORAL_TABLET | Freq: Every day | ORAL | 1 refills | Status: DC

## 2019-01-17 NOTE — Progress Notes (Signed)
Refill on Rx meloxicam 15mg  30tab

## 2019-01-24 ENCOUNTER — Encounter: Payer: Self-pay | Admitting: Podiatry

## 2019-01-24 ENCOUNTER — Ambulatory Visit (INDEPENDENT_AMBULATORY_CARE_PROVIDER_SITE_OTHER): Payer: Medicaid Other | Admitting: Podiatry

## 2019-01-24 ENCOUNTER — Other Ambulatory Visit: Payer: Self-pay

## 2019-01-24 VITALS — Temp 97.0°F

## 2019-01-24 DIAGNOSIS — M216X2 Other acquired deformities of left foot: Secondary | ICD-10-CM

## 2019-01-24 DIAGNOSIS — M7751 Other enthesopathy of right foot: Secondary | ICD-10-CM | POA: Diagnosis not present

## 2019-01-24 DIAGNOSIS — M216X1 Other acquired deformities of right foot: Secondary | ICD-10-CM

## 2019-01-24 DIAGNOSIS — M19079 Primary osteoarthritis, unspecified ankle and foot: Secondary | ICD-10-CM

## 2019-01-24 DIAGNOSIS — G629 Polyneuropathy, unspecified: Secondary | ICD-10-CM

## 2019-01-24 DIAGNOSIS — M722 Plantar fascial fibromatosis: Secondary | ICD-10-CM

## 2019-01-24 DIAGNOSIS — M7752 Other enthesopathy of left foot: Secondary | ICD-10-CM | POA: Diagnosis not present

## 2019-01-27 NOTE — Progress Notes (Signed)
   HPI: 63 year old male presenting today for follow up evaluation of bilateral foot pain. He reports continued pain with ambulation. He has been taking Gabapentin and Meloxicam which he states helps alleviate his symptoms. Patient is here for further evaluation and treatment.   Past Medical History:  Diagnosis Date  . DDD (degenerative disc disease), lumbar    L3/L4  . Diverticulosis   . GERD (gastroesophageal reflux disease)   . Hyperlipidemia   . Low sperm motility   . Pre-diabetes   . Sleep apnea    uses cpap   . SVT (supraventricular tachycardia) (HCC)      Physical Exam: General: The patient is alert and oriented x3 in no acute distress.  Dermatology: Skin is warm, dry and supple bilateral lower extremities. Negative for open lesions or macerations.  Vascular: Palpable pedal pulses bilaterally. No edema or erythema noted. Capillary refill within normal limits.  Neurological: Epicritic and protective threshold grossly intact bilaterally. Burning sensation with ambulation noted to the feet bilaterally.   Musculoskeletal Exam: Pain with palpation to the 1st MPJ bilaterally. Tenderness to palpation to the plantar aspect of the bilateral heels along the plantar fascia. Range of motion within normal limits to all pedal and ankle joints bilateral. Muscle strength 5/5 in all groups bilateral.   Assessment: 1. 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 Peripheral neuropathy BLE 3. Plantar fasciitis bilateral 4. Gastroc equinus bilateral    Plan of Care:  1. Patient evaluated. 2. Surgical intervention vs conservative treatment discussed. Patient will discuss surgery with his family.  3. Continue taking Meloxicam daily.  4. Continue using OTC insoles.  5. Return to clinic as needed.       Edrick Kins, DPM [facility]  Dr. Edrick Kins, DPM    [address] [place], [place] [address]                Office [phone]   Fax [phone]

## 2019-01-29 ENCOUNTER — Telehealth: Payer: Self-pay | Admitting: Podiatry

## 2019-01-29 ENCOUNTER — Other Ambulatory Visit: Payer: Self-pay | Admitting: Podiatry

## 2019-01-29 MED ORDER — CYCLOBENZAPRINE HCL 5 MG PO TABS: 5.0000 mg | ORAL_TABLET | Freq: Three times a day (TID) | ORAL | 1 refills | Status: DC | PRN

## 2019-01-29 NOTE — Telephone Encounter (Signed)
I asked pt if he knew the name of the medication and he stated Dr. Amalia Hailey knew the legs were cramping.

## 2019-01-29 NOTE — Progress Notes (Signed)
Prn muscle cramping

## 2019-01-29 NOTE — Telephone Encounter (Signed)
Pt was seen in office on 01/24/19 and was supposed to have a prescription for a muscle relaxer sent in to his pharmacy. Pt calling to get an update because the pharmacy has not received the prescription.

## 2019-01-29 NOTE — Telephone Encounter (Signed)
I informed pt Dr. Amalia Hailey had sent the flexeril to the [facility].

## 2019-02-08 ENCOUNTER — Telehealth: Payer: Self-pay | Admitting: Medical

## 2019-02-08 NOTE — Telephone Encounter (Signed)
Disability form asking for records in May. Not sure why just got. Will give to you so process to get records sent. Also form I filled out stating unwilling/unable to do exam to determine disability. Please fax that over. Thanks.

## 2019-04-23 ENCOUNTER — Other Ambulatory Visit: Payer: Self-pay

## 2019-04-23 DIAGNOSIS — Z20822 Contact with and (suspected) exposure to covid-19: Secondary | ICD-10-CM

## 2019-04-25 LAB — NOVEL CORONAVIRUS, NAA: SARS-CoV-2, NAA: DETECTED — AB

## 2019-04-27 ENCOUNTER — Telehealth: Payer: Self-pay | Admitting: Internal Medicine

## 2019-04-27 ENCOUNTER — Telehealth: Payer: Self-pay | Admitting: Radiology

## 2019-04-27 DIAGNOSIS — I480 Paroxysmal atrial fibrillation: Secondary | ICD-10-CM

## 2019-04-27 DIAGNOSIS — R001 Bradycardia, unspecified: Secondary | ICD-10-CM

## 2019-04-27 NOTE — Telephone Encounter (Signed)
Spoke with pt and states HR was 36 and felt a little dizzy Informed pt it may be equipment not picking up on all the beats and heart rate is not that low Per pt my equipment is accurate had checked before and was only off by 1 . so did not take Metoprolol Per pt was told to hold before if had low HR.Encouraged pt to check HR and if is 50 or above then needs to take Metoprolol Also pt never mentioned anything about elevated B/P Will forward to Dr Lovena Le for review and recommendations./cy

## 2019-04-27 NOTE — Telephone Encounter (Signed)
Spoke with DOD.  Was advised Pt will need to wear a HM for PAF and bradycardia.  Advised to have Pt continue the metoprolol d/t elevated heart rates.

## 2019-04-27 NOTE — Telephone Encounter (Signed)
Call placed to pt.  Explained need for Pt to continue his current medications.  Advised we would mail him a monitor to wear as soon as possible.  Advised Pt to watch salt intake.  Pt indicates understanding.

## 2019-04-27 NOTE — Telephone Encounter (Signed)
Pt c/o BP issue: STAT if pt c/o blurred vision, one-sided weakness or slurred speech  1. What are your last 5 BP readings? 165/100  2. Are you having any other symptoms (ex. Dizziness, headache, blurred vision, passed out)? Pt has tested positive for COVID, and has been battling COVID symptoms for the past 3 days or so  3. What is your BP issue? High BP, irregular pulse  STAT if HR is under 50 or over 120 (normal HR is 60-100 beats per minute)  1) What is your heart rate? 62  2) Do you have a log of your heart rate readings (document readings)?   3) Do you have any other symptoms? Irregular HR, COVID symptoms  Pt reports that his HR got as low as 36. He does some activity, and it goes up to 70. It has been as high as 77 today, but it went as high as 140 a few days ago

## 2019-04-27 NOTE — Telephone Encounter (Signed)
Enrolled patient for a 14 day Zio telemetry monitor to be mailed. Patient knows to expect the monitor to arrive in 3-4 days.

## 2019-05-07 ENCOUNTER — Other Ambulatory Visit: Payer: Self-pay

## 2019-05-07 DIAGNOSIS — Z20822 Contact with and (suspected) exposure to covid-19: Secondary | ICD-10-CM

## 2019-05-08 ENCOUNTER — Telehealth: Payer: Self-pay

## 2019-05-08 LAB — NOVEL CORONAVIRUS, NAA: SARS-CoV-2, NAA: NOT DETECTED

## 2019-05-08 NOTE — Telephone Encounter (Signed)
I received a call from [facility] [employer]) that the patient has not applied his monitor as of yet.  I tried reaching out to the patient, but had to leave a message.

## 2019-05-09 ENCOUNTER — Ambulatory Visit (INDEPENDENT_AMBULATORY_CARE_PROVIDER_SITE_OTHER): Payer: Medicaid Other

## 2019-05-09 ENCOUNTER — Telehealth: Payer: Self-pay | Admitting: *Deleted

## 2019-05-09 DIAGNOSIS — R001 Bradycardia, unspecified: Secondary | ICD-10-CM | POA: Diagnosis not present

## 2019-05-09 DIAGNOSIS — I480 Paroxysmal atrial fibrillation: Secondary | ICD-10-CM

## 2019-05-09 NOTE — Telephone Encounter (Signed)
Patient has already applied monitor and turned on gateway.  Ir[employer] website shows monitor has been activated.  Instructed patient to call Ir[employer] customer service to verify monitor is transmitting.

## 2019-05-10 ENCOUNTER — Other Ambulatory Visit: Payer: Self-pay

## 2019-05-10 ENCOUNTER — Telehealth: Payer: Self-pay | Admitting: Internal Medicine

## 2019-05-10 NOTE — Telephone Encounter (Addendum)
Rosemae from [employer] calling to report reading on patient's monitor this morning at 12:49 AM EST showing Atrial Flutter at 65-86 bpm. She will upload report to website. I will have monitor technician, Darrick Penna, upload reading for review.  Called and spoke to patient. He states that he felt this episode early this morning. He states that he had mild chest discomfort, SOB, and felt a fluttering in his chest. He states that he took an extra 150 mg of Flecainide and Sx resolved. Patient takes flecainide 150 mg BID and metoprolol tartrate 25 mg BID. Patient is only taking ASA and is not anticoagulated. Will review with MD once monitor available.  Reviewed with Dr. Curt Bears, Dr. Tanna Furry partner. Dr. Curt Bears defers decision to start anticoagulation to Dr. Lovena Le and patient will keep appointment with Dr. Lovena Le on 9/29.

## 2019-05-10 NOTE — Telephone Encounter (Signed)
Rosemae was calling in regards to an abnormal EKG rhythm for the patient. Anyone can discuss the patient's results when calling, just use Reference # [id number]

## 2019-05-10 NOTE — Telephone Encounter (Signed)
Agree with above 

## 2019-05-14 ENCOUNTER — Other Ambulatory Visit: Payer: Self-pay

## 2019-05-14 DIAGNOSIS — Z20822 Contact with and (suspected) exposure to covid-19: Secondary | ICD-10-CM

## 2019-05-16 LAB — NOVEL CORONAVIRUS, NAA: SARS-CoV-2, NAA: NOT DETECTED

## 2019-05-20 ENCOUNTER — Telehealth: Payer: Self-pay | Admitting: Medical

## 2019-05-20 NOTE — Telephone Encounter (Signed)
I signed off on paperwork stating I unable to perform disability consult exam. Will give paperwork to you. Please fax it back.

## 2019-05-21 NOTE — Telephone Encounter (Signed)
Forms faxed today

## 2019-05-22 ENCOUNTER — Encounter: Payer: Self-pay | Admitting: Internal Medicine

## 2019-05-22 ENCOUNTER — Other Ambulatory Visit: Payer: Self-pay

## 2019-05-22 ENCOUNTER — Telehealth: Payer: Self-pay | Admitting: Podiatry

## 2019-05-22 ENCOUNTER — Ambulatory Visit (INDEPENDENT_AMBULATORY_CARE_PROVIDER_SITE_OTHER): Payer: Medicaid Other | Admitting: Internal Medicine

## 2019-05-22 DIAGNOSIS — I4892 Unspecified atrial flutter: Secondary | ICD-10-CM | POA: Diagnosis not present

## 2019-05-22 DIAGNOSIS — I480 Paroxysmal atrial fibrillation: Secondary | ICD-10-CM | POA: Diagnosis not present

## 2019-05-22 MED ORDER — RIVAROXABAN 20 MG PO TABS: 20.0000 mg | ORAL_TABLET | Freq: Every day | ORAL | 3 refills | Status: DC

## 2019-05-22 MED ORDER — RIVAROXABAN 20 MG PO TABS: 20.0000 mg | ORAL_TABLET | Freq: Every day | ORAL | 0 refills | Status: DC

## 2019-05-22 NOTE — Patient Instructions (Addendum)
Medication Instructions:  Your physician has recommended you make the following change in your medication:   1.  Start taking Xarelto 20 mg--Take one tablet by mouth daily with your largest meal  2.  Stop taking ASPIRIN  Labwork: None ordered.  Testing/Procedures: Call me if you decide you want to schedule an ablation  Your physician has recommended that you have an ablation. Catheter ablation is a medical procedure used to treat some cardiac arrhythmias (irregular heartbeats). During catheter ablation, a long, thin, flexible tube is put into a blood vessel in your groin (upper thigh), or neck. This tube is called an ablation catheter. It is then guided to your heart through the blood vessel. Radio frequency waves destroy small areas of heart tissue where abnormal heartbeats may cause an arrhythmia to start. Please see the instruction sheet given to you today.   Follow-Up: Your physician wants you to follow-up in: one year with Dr. Lovena Le.   You will receive a reminder letter in the mail two months in advance. If you don't receive a letter, please call our office to schedule the follow-up appointment.  Any Other Special Instructions Will Be Listed Below (If Applicable).  If you need a refill on your cardiac medications before your next appointment, please call your pharmacy.   Rivaroxaban oral tablets What is this medicine? RIVAROXABAN (ri va ROX a ban) is an anticoagulant (blood thinner). It is used to treat blood clots in the lungs or in the veins. It is also used to prevent blood clots in the lungs or in the veins. It is also used to lower the chance of stroke in people with a medical condition called atrial fibrillation. This medicine may be used for other purposes; ask your health care provider or pharmacist if you have questions. COMMON BRAND NAME(S): Xarelto, Xarelto Starter Pack What should I tell my health care provider before I take this medicine? They need to know if you have  any of these conditions:  antiphospholipid antibody syndrome  artificial heart valve  bleeding disorders  bleeding in the brain  blood in your stools (black or tarry stools) or if you have blood in your vomit  history of blood clots  history of stomach bleeding  kidney disease  liver disease  low blood counts, like low white cell, platelet, or red cell counts  recent or planned spinal or epidural procedure  take medicines that treat or prevent blood clots  an unusual or allergic reaction to rivaroxaban, other medicines, foods, dyes, or preservatives  pregnant or trying to get pregnant  breast-feeding How should I use this medicine? Take this medicine by mouth with a glass of water. Follow the directions on the prescription label. Take your medicine at regular intervals. Do not take it more often than directed. Do not stop taking except on your doctor's advice. Stopping this medicine may increase your risk of a blood clot. Be sure to refill your prescription before you run out of medicine. If you are taking this medicine after hip or knee replacement surgery, take it with or without food. If you are taking this medicine for atrial fibrillation, take it with your evening meal. If you are taking this medicine to treat blood clots, take it with food at the same time each day. If you are unable to swallow your tablet, you may crush the tablet and mix it in applesauce. Then, immediately eat the applesauce. You should eat more food right after you eat the applesauce containing the crushed  tablet. Talk to your pediatrician regarding the use of this medicine in children. Special care may be needed. Overdosage: If you think you have taken too much of this medicine contact a poison control center or emergency room at once. NOTE: This medicine is only for you. Do not share this medicine with others. What if I miss a dose? If you take your medicine once a day and miss a dose, take the missed  dose as soon as you remember. If it is almost time for your next dose, take only that dose. Do not take double or extra doses. If you take your medicine twice a day and miss a dose, take the missed dose immediately. In this instance, 2 tablets may be taken at the same time. The next day you should take 1 tablet twice a day as directed. What may interact with this medicine? Do not take this medicine with any of the following medications:  defibrotide This medicine may also interact with the following medications:  aspirin and aspirin-like medicines  certain antibiotics like erythromycin, azithromycin, and clarithromycin  certain medicines for fungal infections like ketoconazole and itraconazole  certain medicines for irregular heart beat like amiodarone, quinidine, dronedarone  certain medicines for seizures like carbamazepine, phenytoin  certain medicines that treat or prevent blood clots like warfarin, enoxaparin, and dalteparin  conivaptan  felodipine  indinavir  lopinavir; ritonavir  NSAIDS, medicines for pain and inflammation, like ibuprofen or naproxen  ranolazine  rifampin  ritonavir  SNRIs, medicines for depression, like desvenlafaxine, duloxetine, levomilnacipran, venlafaxine  SSRIs, medicines for depression, like citalopram, escitalopram, fluoxetine, fluvoxamine, paroxetine, sertraline  St. John's wort  verapamil This list may not describe all possible interactions. Give your health care provider a list of all the medicines, herbs, non-prescription drugs, or dietary supplements you use. Also tell them if you smoke, drink alcohol, or use illegal drugs. Some items may interact with your medicine. What should I watch for while using this medicine? Visit your healthcare professional for regular checks on your progress. You may need blood work done while you are taking this medicine. Your condition will be monitored carefully while you are receiving this medicine. It  is important not to miss any appointments. Avoid sports and activities that might cause injury while you are using this medicine. Severe falls or injuries can cause unseen bleeding. Be careful when using sharp tools or knives. Consider using an Neurosurgeon. Take special care brushing or flossing your teeth. Report any injuries, bruising, or red spots on the skin to your healthcare professional. If you are going to need surgery or other procedure, tell your healthcare professional that you are taking this medicine. Wear a medical ID bracelet or chain. Carry a card that describes your disease and details of your medicine and dosage times. What side effects may I notice from receiving this medicine? Side effects that you should report to your doctor or health care professional as soon as possible:  allergic reactions like skin rash, itching or hives, swelling of the face, lips, or tongue  back pain  redness, blistering, peeling or loosening of the skin, including inside the mouth  signs and symptoms of bleeding such as bloody or black, tarry stools; red or dark-brown urine; spitting up blood or brown material that looks like coffee grounds; red spots on the skin; unusual bruising or bleeding from the eye, gums, or nose  signs and symptoms of a blood clot such as chest pain; shortness of breath; pain, swelling, or warmth  in the leg  signs and symptoms of a stroke such as changes in vision; confusion; trouble speaking or understanding; severe headaches; sudden numbness or weakness of the face, arm or leg; trouble walking; dizziness; loss of coordination Side effects that usually do not require medical attention (report to your doctor or health care professional if they continue or are bothersome):  dizziness  muscle pain This list may not describe all possible side effects. Call your doctor for medical advice about side effects. You may report side effects to FDA at 1-800-FDA-1088. Where should  I keep my medicine? Keep out of the reach of children. Store at room temperature between 15 and 30 degrees C (59 and 86 degrees F). Throw away any unused medicine after the expiration date. NOTE: This sheet is a summary. It may not cover all possible information. If you have questions about this medicine, talk to your doctor, pharmacist, or health care provider.  2020 Elsevier/Gold Standard (2018-11-06 09:45:59)

## 2019-05-22 NOTE — Progress Notes (Signed)
HPI [name] returns today for followup of his atrial arrhythmias. 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 He then developed PAF. He has been treated with flecainide with good control. He has developed atrial flutter which has been incessant. He wore a cardiac monitor and he was in atrial flutter during the entire monitoring period. He feels palpitations and dyspnea. No chest pain. No edema.  Allergies  Allergen Reactions  . Tree Extract   . Pork-Derived Products Swelling  . Statins     Muscle aches     Current Outpatient Medications  Medication Sig Dispense Refill  . acetaminophen (TYLENOL) 325 MG tablet Take 2 tablets (650 mg total) by mouth every 6 (six) hours.    [name]. albuterol (PROAIR HFA) 108 (90 Base) MCG/ACT inhaler Inhale 1-2 puffs into the lungs every 6 (six) hours as needed for wheezing or shortness of breath. 1 Inhaler 0  . aspirin 81 MG tablet Take 81 mg by mouth daily.    [name]. azelastine (ASTELIN) 0.1 % nasal spray 1-2 sprays per nostril 2 times daily as needed. 30 mL 5  . budesonide-formoterol (SYMBICORT) 160-4.5 MCG/ACT inhaler Inhale 2 puffs into the lungs 2 (two) times daily. 1 Inhaler 0  . cyclobenzaprine (FLEXERIL) 5 MG tablet Take 1 tablet (5 mg total) by mouth 3 (three) times daily as needed for muscle spasms. 30 tablet 1  . ezetimibe (ZETIA) 10 MG tablet Take 1 tablet (10 mg total) by mouth daily. 30 tablet 11  . famotidine-calcium carbonate-magnesium hydroxide (PEPCID COMPLETE) 10-800-165 MG chewable tablet Chew by mouth.    . fenofibrate (TRICOR) 145 MG tablet Take 1 tablet by mouth daily.    . flecainide (TAMBOCOR) 150 MG tablet Take 1 tablet (150 mg total) by mouth 2 (two) times daily. 60 tablet 11  . fluticasone (FLONASE) 50 MCG/ACT nasal spray Place 1 spray into both nostrils daily. 16 g 0  . gabapentin (NEURONTIN) 100 MG capsule Take 1 capsule (100 mg total) by mouth 3 (three) times daily. 90 capsule 1   . ibuprofen (ADVIL) 200 MG tablet Take 3 tablets (600 mg total) by mouth every 6 (six) hours.    [name]. levocetirizine (XYZAL) 5 MG tablet TAKE 1 TABLET(5 MG) BY MOUTH EVERY EVENING 90 tablet 0  . meloxicam (MOBIC) 15 MG tablet Take 1 tablet (15 mg total) by mouth daily. 30 tablet 1  . metFORMIN (GLUCOPHAGE) 500 MG tablet Take 1 tablet (500 mg total) by mouth 2 (two) times daily with a meal. 60 tablet 3  . metoprolol tartrate (LOPRESSOR) 25 MG tablet TAKE 1 TABLET(25 MG) BY MOUTH TWICE DAILY (Patient taking differently: No sig reported) 180 tablet 2  . montelukast (SINGULAIR) 10 MG tablet Take 1 tablet once at night for coughing or wheezing. 30 tablet 1  . pantoprazole (PROTONIX) 40 MG tablet Take 1 tablet (40 mg total) by mouth 2 (two) times daily. 60 tablet 0  . PRESCRIPTION MEDICATION Inhale into the lungs at bedtime. CPAP    . sildenafil (REVATIO) 20 MG tablet Take 1-2 tablets (20-40 mg total) by mouth as needed. 60 tablet 1  . Vitamin D, Ergocalciferol, (DRISDOL) 1.25 MG (50000 UT) CAPS capsule Take 1 capsule (50,000 Units total) by mouth every 7 (seven) days. 4 capsule 0   No current facility-administered medications for this visit.      Past Medical History:  Diagnosis Date  . DDD (degenerative disc disease),  lumbar    L3/L4  . Diverticulosis   . GERD (gastroesophageal reflux disease)   . Hyperlipidemia   . Low sperm motility   . Pre-diabetes   . Sleep apnea    uses cpap   . SVT (supraventricular tachycardia) (HCC)     ROS:   All systems reviewed and negative except as noted in the HPI.   Past Surgical History:  Procedure Laterality Date  . 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 REPAIR OF LEFT INDEX AND LONG FINGER BY FUSION /  REPAIR OF RING FINGER;  Surgeon: Milly Jakob, MD;  Location: [facility];  Service: Orthopedics;  Laterality: Left;  . BACK SURGERY    . CARDIAC CATHETERIZATION N/A 12/22/2015   Procedure: Left Heart Cath and Coronary Angiography;  Surgeon: Peter M  Martinique, MD;  Location: [facility];  Service: Cardiovascular;  Laterality: N/A;  . CARDIAC ELECTROPHYSIOLOGY STUDY AND ABLATION    . ELECTROPHYSIOLOGIC STUDY N/A 02/19/2016   Procedure: SVT Ablation;  Surgeon: Evans Lance, MD;  Location: [facility];  Service: Cardiovascular;  Laterality: N/A;  . septal reconstruction  2017  . TURBINATE REDUCTION Bilateral 07/2016  . WISDOM TOOTH EXTRACTION       Family History  Problem Relation Age of Onset  . Hypertension Father   . Food Allergy Son   . Colon cancer Neg Hx   . Stomach cancer Neg Hx   . Allergic rhinitis Neg Hx   . Angioedema Neg Hx   . Asthma Neg Hx   . Eczema Neg Hx   . Immunodeficiency Neg Hx   . Urticaria Neg Hx      Social History   Socioeconomic History  . Marital status: Married    Spouse name: Not on file  . Number of children: Not on file  . Years of education: Not on file  . Highest education level: Not on file  Occupational History  . Not on file  Social Needs  . Financial resource strain: Not on file  . Food insecurity    Worry: Not on file    Inability: Not on file  . Transportation needs    Medical: Not on file    Non-medical: Not on file  Tobacco Use  . Smoking status: Former Smoker    Packs/day: 1.00    Years: 10.00    Pack years: 10.00    Types: Cigarettes    Quit date: 1992    Years since quitting: 28.7  . Smokeless tobacco: Never Used  . Tobacco comment: Uses Hookah Pipe occasionally  Substance and Sexual Activity  . Alcohol use: No  . Drug use: No  . Sexual activity: Yes  Lifestyle  . Physical activity    Days per week: Not on file    Minutes per session: Not on file  . Stress: Not on file  Relationships  . Social Herbalist on phone: Not on file    Gets together: Not on file    Attends religious service: Not on file    Active member of club or organization: Not on file    Attends meetings of clubs or organizations: Not on file    Relationship status:  Not on file  . Intimate partner violence    Fear of current or ex partner: Not on file    Emotionally abused: Not on file    Physically abused: Not on file    Forced sexual activity: Not on file  Other Topics Concern  . Not on file  Social History Narrative   Marital Status: Married Engineer, drilling)   Children:  4 (3 Sons/1 Daughter)   Pets:  None    Living Situation: Lives with spouse and 4 children   Origin:  He was born in [place].   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     Alcohol Use:  None   Drug Use:  None   Diet:  Regular   Exercise:  None   Hobbies:  Cards     BP 120/82   Pulse 80   Ht 5\' 8"  (1.727 m)   Wt 247 lb (112 kg)   SpO2 98%   BMI 37.56 kg/m   Physical Exam:  Well appearing NAD HEENT: Unremarkable Neck:  6 cm JVD, no thyromegally Lymphatics:  No adenopathy Back:  No CVA tenderness Lungs:  Clear with no wheezes HEART:  Regular rate rhythm, no murmurs, no rubs, no clicks Abd:  soft, positive bowel sounds, no organomegally, no rebound, no guarding Ext:  2 plus pulses, no edema, no cyanosis, no clubbing Skin:  No rashes no nodules Neuro:  CN II through XII intact, motor grossly intact  EKG - atrial flutter with a controlled VR.  Assess/Plan: 1. Atrial flutter - I discussed the various treatment options. Ablation of flutter and continued flecainide vs atrial fib and flutter ablation plus/minus more flecainde, vs uptitration of rate control, vs switching to either amio or dofetilide were outlined in detail. He will restart eliquis and call if he has made a decision on what course to take.  [place].D.

## 2019-05-22 NOTE — Telephone Encounter (Signed)
Calling you back about release form on [name]. I will fax you another request with an electronic signature. Any questions, call me at [phone].

## 2019-05-29 ENCOUNTER — Other Ambulatory Visit: Payer: Self-pay | Admitting: Family

## 2019-05-29 DIAGNOSIS — J3089 Other allergic rhinitis: Secondary | ICD-10-CM

## 2019-05-29 DIAGNOSIS — R062 Wheezing: Secondary | ICD-10-CM

## 2019-05-29 DIAGNOSIS — R0609 Other forms of dyspnea: Secondary | ICD-10-CM

## 2019-06-19 DIAGNOSIS — M79676 Pain in unspecified toe(s): Secondary | ICD-10-CM

## 2019-07-02 ENCOUNTER — Telehealth: Payer: Self-pay | Admitting: Neurology

## 2019-07-02 NOTE — Telephone Encounter (Signed)
Called the patient to make him aware that I saw he was on waitlist to be seen. He is seen yearly for CPAP visit. Ward Givens had opening that was available this week and wanted to offer for the patient to be seen sooner. LVM with this information advising the pt to call back.

## 2019-07-04 ENCOUNTER — Ambulatory Visit: Payer: Medicaid Other | Admitting: Adult Health

## 2019-07-30 ENCOUNTER — Encounter: Payer: Self-pay | Admitting: Neurology

## 2019-08-01 ENCOUNTER — Ambulatory Visit: Payer: Medicaid Other | Admitting: Neurology

## 2019-08-01 ENCOUNTER — Other Ambulatory Visit: Payer: Self-pay

## 2019-08-01 ENCOUNTER — Encounter

## 2019-08-01 ENCOUNTER — Encounter: Payer: Self-pay | Admitting: Neurology

## 2019-08-01 VITALS — BP 149/93 | HR 66 | Temp 97.4°F | Ht 69.25 in | Wt 225.0 lb

## 2019-08-01 DIAGNOSIS — G933 Postviral fatigue syndrome: Secondary | ICD-10-CM

## 2019-08-01 DIAGNOSIS — I471 Supraventricular tachycardia: Secondary | ICD-10-CM

## 2019-08-01 DIAGNOSIS — G4733 Obstructive sleep apnea (adult) (pediatric): Secondary | ICD-10-CM

## 2019-08-01 DIAGNOSIS — U071 COVID-19: Secondary | ICD-10-CM

## 2019-08-01 DIAGNOSIS — I480 Paroxysmal atrial fibrillation: Secondary | ICD-10-CM | POA: Diagnosis not present

## 2019-08-01 DIAGNOSIS — J411 Mucopurulent chronic bronchitis: Secondary | ICD-10-CM | POA: Diagnosis not present

## 2019-08-01 DIAGNOSIS — G9331 Postviral fatigue syndrome: Secondary | ICD-10-CM

## 2019-08-01 DIAGNOSIS — Z9989 Dependence on other enabling machines and devices: Secondary | ICD-10-CM

## 2019-08-01 NOTE — Patient Instructions (Signed)
I like you to follow up with heart care, your fatigue may be related to atrial fibrillation.   Fatigue If you have fatigue, you feel tired all the time and have a lack of energy or a lack of motivation. Fatigue may make it difficult to start or complete tasks because of exhaustion. In general, occasional or mild fatigue is often a normal response to activity or life. However, long-lasting (chronic) or extreme fatigue may be a symptom of a medical condition. Follow these instructions at home: General instructions  Watch your fatigue for any changes.  Go to bed and get up at the same time every day.  Avoid fatigue by pacing yourself during the day and getting enough sleep at night.  Maintain a healthy weight. Medicines  Take over-the-counter and prescription medicines only as told by your health care provider.  Take a multivitamin, if told by your health care provider.  Do not use herbal or dietary supplements unless they are approved by your health care provider. Activity   Exercise regularly, as told by your health care provider.  Use or practice techniques to help you relax, such as yoga, tai chi, meditation, or massage therapy. Eating and drinking   Avoid heavy meals in the evening.  Eat a well-balanced diet, which includes lean proteins, whole grains, plenty of fruits and vegetables, and low-fat dairy products.  Avoid consuming too much caffeine.  Avoid the use of alcohol.  Drink enough fluid to keep your urine pale yellow. Lifestyle  Change situations that cause you stress. Try to keep your work and personal schedule in balance.  Do not use any products that contain nicotine or tobacco, such as cigarettes and e-cigarettes. If you need help quitting, ask your health care provider.  Do not use drugs. Contact a health care provider if:  Your fatigue does not get better.  You have a fever.  You suddenly lose or gain weight.  You have headaches.  You  have trouble falling asleep or sleeping through the night.  You feel angry, guilty, anxious, or sad.  You are unable to have a bowel movement (constipation).  Your skin is dry.  You have swelling in your legs or another part of your body. Get help right away if:  You feel confused.  Your vision is blurry.  You feel faint or you pass out.  You have a severe headache.  You have severe pain in your abdomen, your back, or the area between your waist and hips (pelvis).  You have chest pain, shortness of breath, or an irregular or fast heartbeat.  You are unable to urinate, or you urinate less than normal.  You have abnormal bleeding, such as bleeding from the rectum, vagina, nose, lungs, or nipples.  You vomit blood.  You have thoughts about hurting yourself or others. If you ever feel like you may hurt yourself or others, or have thoughts about taking your own life, get help right away. You can go to your nearest emergency department or call:  Your local emergency services (911 in the [place]  A suicide crisis helpline, such as the [place] at (626)576-9082. This is open 24 hours a day. Summary  If you have fatigue, you feel tired all the time and have a lack of energy or a lack of motivation.  Fatigue may make it difficult to start or complete tasks because of exhaustion.  Long-lasting (chronic) or extreme fatigue may be a symptom of a  medical condition.  Exercise regularly, as told by your health care provider.  Change situations that cause you stress. Try to keep your work and personal schedule in balance. This information is not intended to replace advice given to you by your health care provider. Make sure you discuss any questions you have with your health care provider. Document Released: 06/06/2007 Document Revised: 11/30/2018 Document Reviewed: 05/04/2017 Elsevier Patient Education  2020 [employer].

## 2019-08-01 NOTE — Progress Notes (Addendum)
PATIENT: Richard Walker DOB: 06/02/1956  REASON FOR VISIT: follow up HISTORY FROM: patient  HISTORY OF PRESENT ILLNESS: Today 08/01/19  Richard Walker is a 63 year old male with a history of obstructive sleep apnea on CPAP.  Richard Walker states that Richard Walker has had a lot change in medical history, is experiencing exhaustion and fatigue all the time. Richard Walker finds that Richard Walker has to take a nap every day. Richard Walker had Covid in August but was minor. Richard Walker has concerns of dry mouth/waking up frequently during the night. biotene has helped some.  Richard Walker reports that before Richard Walker contracted Covid Richard Walker already felt an increasing amount of fatigue and exhaustion but it has worsened with the infection.  At this point the patient's Epworth Sleepiness Scale was endorsed at a high level= 18 out of 24 possible points and a narcoleptic level. The fatigue also affects him at 63 out of 63 possible points.  Richard Walker reports frontal headaches, fragmented sleep, Richard Walker is out of job ( at [employer] after 23 years) and can't even do activities around the house. Had SVT and Ablation- 2017- now  Atrial fib relapses, was not on anticoagulation and started.  Started metoprolol. Heart monitor found persistent a fib. Richard Walker has a sore , dry throat and is always achy. Started fleccanide. Dr Alberteen Alexandros did CMET,TSH and Vit D after she found him Covid positive.  Those numbers looked (good reportedly).  I reviewed the CPAP data: CPAP data shows 100% compliance with an average use at time of 9 hours and 19 minutes, 100% of these days the CPAP was used over 4 hours.  The CPAP 90 percentile pressure is 8.9 cmH2O and Richard Walker residual AHI is only 1.5/h which shows of good resolution the minimum setting is 5 7 cmH2O the maximum setting is 12 cmH2O and the expiratory pressure relief is 3 cmH2O.  This seems to control the apnea very well.       07-28-2018 Richard Walker returns today for follow-up.  Richard Walker download indicates that Richard Walker uses machine 29 out of 30 days for compliance of 96%.  Richard Walker uses machine  other than 4 hours each night.  On average Richard Walker uses Richard Walker machine 8 hours and 9 minutes.  Richard Walker residual AHI is 2.4.  Richard Walker is currently on AutoSet with pressure 7 to 12 cm of water.  Richard Walker does feel Richard Walker mask leaking.  Richard Walker has not changed out Richard Walker supplies in over 6 months.  Richard Walker returns today for evaluation.  Richard Walker Epworth sleepiness score is 18, fatigue severity score is 63.    HISTORY Mr. Netti reports that Richard Walker lost Richard Walker father in December 22, multiple health complications and finally lost Richard Walker job, Richard Walker wife also had to undergo several surgeries.  2018 was not an easy year for him.  Richard Walker had seen [name] for a nasal septoplasty surgery it does seem to have helped with nasal patency now but Richard Walker had some hiccups on the way including epistaxis, and it took a long time to see the mucous lining finally swelled down.  Richard Walker is now able to use CPAP with good compliance again, Richard Walker compliance is 90% plus average use of time is 7 hours and 12 minutes, Richard Walker has used  Auto CPAP with a mean pressure of 8.4 cmH2O , the average AHI residual is 1.9/hr. , no changes in settings have to be done.  Minimum pressure is 7, maximum pressure is 12 cmH2O with 3 cm EPR ramp starts at 4 cmH2O.  Richard Walker has seen Richard Walker  cardiologist, [name], who has treated him for SVTs by Ablation.  Richard Walker tachycardia subsided for about 4 months but then after the ablation did come back.  [name] suspect that there is another causative problem.  May be acid reflux, may be apnea -however Richard Walker apnea should not be a risk factor at this successful resolution. Underwent  upper Gi/ gastroscopy with no abnormalities. Richard Walker now presents for SOB, coughing and palpitation- Richard Walker likes to use Richard Walker CPAP as it helps to prevent tachycardia and palpitations. Chest X ray was taken on 1-7 and Richard Walker stated Richard Walker has not been given results. There is an Epic note, small lung volume old scarring. described productive cough -?  Mucopurulent bronchitis ?[name] On doxicyline and mucinex.     REVIEW OF SYSTEMS:  Out of a complete 14 system review of symptoms, the patient complains only of the following symptoms, and all other reviewed systems are negative.  See HPI  ALLERGIES: Allergies  Allergen Reactions  . Tree Extract   . Pork-Derived Products Swelling  . Statins     Muscle aches    HOME MEDICATIONS: Outpatient Medications Prior to Visit  Medication Sig Dispense Refill  . acetaminophen (TYLENOL) 325 MG tablet Take 2 tablets (650 mg total) by mouth every 6 (six) hours.    [name] albuterol (PROAIR HFA) 108 (90 Base) MCG/ACT inhaler Inhale 1-2 puffs into the lungs every 6 (six) hours as needed for wheezing or shortness of breath. 1 Inhaler 0  . azelastine (ASTELIN) 0.1 % nasal spray 1-2 sprays per nostril 2 times daily as needed. 30 mL 5  . budesonide-formoterol (SYMBICORT) 160-4.5 MCG/ACT inhaler Inhale 2 puffs into the lungs 2 (two) times daily. 1 Inhaler 0  . cyclobenzaprine (FLEXERIL) 5 MG tablet Take 1 tablet (5 mg total) by mouth 3 (three) times daily as needed for muscle spasms. 30 tablet 1  . ezetimibe (ZETIA) 10 MG tablet Take 1 tablet (10 mg total) by mouth daily. 30 tablet 11  . famotidine-calcium carbonate-magnesium hydroxide (PEPCID COMPLETE) 10-800-165 MG chewable tablet Chew by mouth.    . fenofibrate (TRICOR) 145 MG tablet Take 1 tablet by mouth daily.    . flecainide (TAMBOCOR) 150 MG tablet Take 1 tablet (150 mg total) by mouth 2 (two) times daily. 60 tablet 11  . fluticasone (FLONASE) 50 MCG/ACT nasal spray Place 1 spray into both nostrils daily. 16 g 0  . gabapentin (NEURONTIN) 100 MG capsule Take 1 capsule (100 mg total) by mouth 3 (three) times daily. 90 capsule 1  . ibuprofen (ADVIL) 200 MG tablet Take 3 tablets (600 mg total) by mouth every 6 (six) hours.    [name] levocetirizine (XYZAL) 5 MG tablet TAKE 1 TABLET(5 MG) BY MOUTH EVERY EVENING 90 tablet 0  . meloxicam (MOBIC) 15 MG tablet Take 1 tablet (15 mg total) by mouth daily. 30 tablet 1  . metFORMIN (GLUCOPHAGE) 500 MG  tablet Take 1 tablet (500 mg total) by mouth 2 (two) times daily with a meal. 60 tablet 3  . metoprolol tartrate (LOPRESSOR) 25 MG tablet TAKE 1 TABLET(25 MG) BY MOUTH TWICE DAILY (Patient taking differently: No sig reported) 180 tablet 2  . montelukast (SINGULAIR) 10 MG tablet Take 1 tablet once at night for coughing or wheezing. 30 tablet 1  . pantoprazole (PROTONIX) 40 MG tablet Take 1 tablet (40 mg total) by mouth 2 (two) times daily. 60 tablet 0  . PRESCRIPTION MEDICATION Inhale into the lungs at bedtime. CPAP    . rivaroxaban (  XARELTO) 20 MG TABS tablet Take 1 tablet (20 mg total) by mouth daily with supper. 90 tablet 3  . sildenafil (REVATIO) 20 MG tablet Take 1-2 tablets (20-40 mg total) by mouth as needed. 60 tablet 1  . Vitamin D, Ergocalciferol, (DRISDOL) 1.25 MG (50000 UT) CAPS capsule Take 1 capsule (50,000 Units total) by mouth every 7 (seven) days. 4 capsule 0   No facility-administered medications prior to visit.     PAST MEDICAL HISTORY: Past Medical History:  Diagnosis Date  . DDD (degenerative disc disease), lumbar    L3/L4  . Diverticulosis   . GERD (gastroesophageal reflux disease)   . Hyperlipidemia   . Low sperm motility   . Pre-diabetes   . Sleep apnea    uses cpap   . SVT (supraventricular tachycardia) (HCC)     PAST SURGICAL HISTORY: Past Surgical History:  Procedure Laterality Date  . 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 REPAIR OF LEFT INDEX AND LONG FINGER BY FUSION /  REPAIR OF RING FINGER;  Surgeon: Mack Hook, MD;  Location: [facility] OR;  Service: Orthopedics;  Laterality: Left;  . BACK SURGERY    . CARDIAC CATHETERIZATION N/A 12/22/2015   Procedure: Left Heart Cath and Coronary Angiography;  Surgeon: Peter M Swaziland, MD;  Location: [facility] INVASIVE CV LAB;  Service: Cardiovascular;  Laterality: N/A;  . CARDIAC ELECTROPHYSIOLOGY STUDY AND ABLATION    . ELECTROPHYSIOLOGIC STUDY N/A 02/19/2016   Procedure: SVT Ablation;  Surgeon: Marinus Maw, MD;   Location: [facility] INVASIVE CV LAB;  Service: Cardiovascular;  Laterality: N/A;  . septal reconstruction  2017  . TURBINATE REDUCTION Bilateral 07/2016  . WISDOM TOOTH EXTRACTION      FAMILY HISTORY: Family History  Problem Relation Age of Onset  . Hypertension Father   . Food Allergy Son   . Colon cancer Neg Hx   . Stomach cancer Neg Hx   . Allergic rhinitis Neg Hx   . Angioedema Neg Hx   . Asthma Neg Hx   . Eczema Neg Hx   . Immunodeficiency Neg Hx   . Urticaria Neg Hx     SOCIAL HISTORY: Social History   Socioeconomic History  . Marital status: Married    Spouse name: Not on file  . Number of children: Not on file  . Years of education: Not on file  . Highest education level: Not on file  Occupational History  . Not on file  Social Needs  . Financial resource strain: Not on file  . Food insecurity    Worry: Not on file    Inability: Not on file  . Transportation needs    Medical: Not on file    Non-medical: Not on file  Tobacco Use  . Smoking status: Former Smoker    Packs/day: 1.00    Years: 10.00    Pack years: 10.00    Types: Cigarettes    Quit date: 1992    Years since quitting: 28.9  . Smokeless tobacco: Never Used  . Tobacco comment: Uses Hookah Pipe occasionally  Substance and Sexual Activity  . Alcohol use: No  . Drug use: No  . Sexual activity: Yes  Lifestyle  . Physical activity    Days per week: Not on file    Minutes per session: Not on file  . Stress: Not on file  Relationships  . Social Musician on phone: Not on file    Gets together: Not on file  Attends religious service: Not on file    Active member of club or organization: Not on file    Attends meetings of clubs or organizations: Not on file    Relationship status: Not on file  . Intimate partner violence    Fear of current or ex partner: Not on file    Emotionally abused: Not on file    Physically abused: Not on file    Forced sexual activity: Not on file  Other  Topics Concern  . Not on file  Social History Narrative   Marital Status: Married Engineer, drilling)   Children:  4 (3 Sons/1 Daughter)   Pets:  None    Living Situation: Lives with spouse and 4 children   Origin:  Richard Walker was born in [place].   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     Alcohol Use:  None   Drug Use:  None   Diet:  Regular   Exercise:  None   Hobbies:  Cards      PHYSICAL EXAM  Vitals:   08/01/19 1331  BP: (!) 149/93  Pulse: 66  Temp: (!) 97.4 F (36.3 C)  Weight: 225 lb (102.1 kg)  Height: 5' 9.25" (1.759 m)   Body mass index is 32.99 kg/m.  Generalized: Well developed, in no acute distress   Regular rate , NSR- no murmur.   Neurological examination  Mentation: Alert oriented to time, place, history taking. Follows all commands speech and language fluent Cranial nerve :  Lost smell and taste for abut 7 days. Extraocular movements were full, visual field were full on confrontational test. Facial sensation and strength were normal. Uvula and tongue in midline. Neck ROM and shoulder shrug  were normal and symmetric. Motor: The motor testing reveals 5 /5 strength of all 4 extremities.  Symmetric motor tone is noted throughout. Neck circumference 18.5 inches, Mallampati 4+ . Sensory: Sensory testing is intact to soft touch on all 4 extremities. No evidence of extinction is noted. Coordination:  good finger-nose- bilaterally.  Gait and station: Gait is intact.     DIAGNOSTIC DATA (LABS, IMAGING, TESTING) - I reviewed patient records, labs, notes, testing and imaging myself where available.  Lab Results  Component Value Date   WBC 7.7 07/15/2018   HGB 16.3 07/15/2018   HCT 50.8 07/15/2018   MCV 87.1 07/15/2018   PLT 216 07/15/2018      Component Value Date/Time   NA 139 08/22/2018 0748   K 4.1 08/22/2018 0748   CL 102  08/22/2018 0748   CO2 28 08/22/2018 0748   GLUCOSE 94 08/22/2018 0748   BUN 18 08/22/2018 0748   CREATININE 1.09 08/22/2018 0748   CREATININE 1.18 02/12/2016 1508   CALCIUM 9.5 08/22/2018 0748   PROT 6.7 08/22/2018 0748   ALBUMIN 4.2 08/22/2018 0748   AST 13 08/22/2018 0748   ALT 19 08/22/2018 0748   ALKPHOS 80 08/22/2018 0748   BILITOT 0.5 08/22/2018 0748   GFRNONAA >60 07/15/2018 1324   GFRNONAA >89 03/04/2014 1456   GFRAA >60 07/15/2018 1324   GFRAA >89 03/04/2014 1456   Lab Results  Component Value Date   CHOL 211 (H) 08/22/2018   HDL 36.50 (L) 08/22/2018   LDLCALC 162 (H) 03/04/2014   LDLDIRECT 142.0 08/22/2018   TRIG 266.0 (H) 08/22/2018   CHOLHDL 6 08/22/2018   Lab Results  Component Value Date   HGBA1C  5.8 06/30/2018   Lab Results  Component Value Date   VITAMINB12 304 06/28/2018   Lab Results  Component Value Date   TSH 1.23 06/28/2018      ASSESSMENT AND PLAN 63 y.o. year old male  has a past medical history of DDD (degenerative disc disease), lumbar, Diverticulosis, GERD (gastroesophageal reflux disease), Hyperlipidemia, Low sperm motility, Pre-diabetes, Sleep apnea, and SVT (supraventricular tachycardia) (HCC). here with :  1.  Obstructive sleep apnea on CPAP, well controlled.  2.  Extreme fatigue and hypersomnia since beginning of the year, atrial fibrillation - can be causing fatigue. 3.  Post -Covid anosmia and ageusia- myalgia and fatigue. Post viral symptoms. ?    The patient CPAP download shows excellent compliance and good treatment of Richard Walker apnea.  Richard Walker is encouraged to continue using the CPAP nightly and greater than 4 hours each night.  An order for new supplies has been sent to Richard Walker DME company. Richard Walker is advised to consider cardiac ablation/   Richard Walker will follow-up in 6 month  or sooner if needed. I will send a compliance reports to [facility]- adapt- Richard Walker needs supplies.    Richard Walker needs a heated hose, new mask liners, head gear , CPAP  filter, new watertank.    Richard Walker continues to use Richard Walker wn pulse-oximeter.   I spent 30 minutes with the patient. 50% of this time was spent reviewing Richard Walker CPAP download   Melvyn Novas, MD  08/01/2019, 1:50 PM [facility] [address] [place], [place] [address] [phone]

## 2019-08-02 LAB — SAR COV2 SEROLOGY (COVID19)AB(IGG),IA: DiaSorin SARS-CoV-2 Ab, IgG: POSITIVE — AB

## 2019-08-02 NOTE — Progress Notes (Signed)
Indeed positive ab for COVID, immunity is present.

## 2019-08-06 ENCOUNTER — Encounter: Payer: Self-pay | Admitting: Neurology

## 2019-08-06 ENCOUNTER — Other Ambulatory Visit: Payer: Self-pay | Admitting: Internal Medicine

## 2019-12-25 ENCOUNTER — Encounter: Payer: Self-pay | Admitting: Neurology

## 2019-12-25 ENCOUNTER — Other Ambulatory Visit: Payer: Self-pay | Admitting: Internal Medicine

## 2019-12-31 ENCOUNTER — Ambulatory Visit: Payer: Medicaid Other | Attending: Internal Medicine

## 2020-01-08 DIAGNOSIS — Z0289 Encounter for other administrative examinations: Secondary | ICD-10-CM

## 2020-01-14 ENCOUNTER — Other Ambulatory Visit: Payer: Self-pay

## 2020-01-14 ENCOUNTER — Encounter: Payer: Self-pay | Admitting: Podiatry

## 2020-01-14 ENCOUNTER — Ambulatory Visit: Payer: Medicaid Other | Admitting: Podiatry

## 2020-01-14 DIAGNOSIS — M7751 Other enthesopathy of right foot: Secondary | ICD-10-CM | POA: Diagnosis not present

## 2020-01-14 DIAGNOSIS — M7752 Other enthesopathy of left foot: Secondary | ICD-10-CM | POA: Diagnosis not present

## 2020-01-14 DIAGNOSIS — M722 Plantar fascial fibromatosis: Secondary | ICD-10-CM

## 2020-01-14 DIAGNOSIS — M19079 Primary osteoarthritis, unspecified ankle and foot: Secondary | ICD-10-CM

## 2020-01-14 MED ORDER — METHYLPREDNISOLONE 4 MG PO TBPK: ORAL_TABLET | ORAL | 0 refills | Status: DC

## 2020-01-14 MED ORDER — DICLOFENAC SODIUM 75 MG PO TBEC: 75.0000 mg | DELAYED_RELEASE_TABLET | Freq: Two times a day (BID) | ORAL | 2 refills | Status: DC

## 2020-01-16 NOTE — Progress Notes (Signed)
   HPI: 64 year old male presenting today for follow up evaluation of bilateral foot pain. He reports worsening sharp and shooting ankle pain. He has been using compression therapy and taking OTC pain medication with temporary relief. Being on the feet for long periods of time increases the pain. Patient is here for further evaluation and treatment.   Past Medical History:  Diagnosis Date  . DDD (degenerative disc disease), lumbar    L3/L4  . Diverticulosis   . GERD (gastroesophageal reflux disease)   . Hyperlipidemia   . Low sperm motility   . Pre-diabetes   . Sleep apnea    uses cpap   . SVT (supraventricular tachycardia) (HCC)      Physical Exam: General: The patient is alert and oriented x3 in no acute distress.  Dermatology: Skin is warm, dry and supple bilateral lower extremities. Negative for open lesions or macerations.  Vascular: Palpable pedal pulses bilaterally. No edema or erythema noted. Capillary refill within normal limits.  Neurological: Epicritic and protective threshold grossly intact bilaterally. Burning sensation with ambulation noted to the feet bilaterally.   Musculoskeletal Exam: Pain with palpation to the 1st MPJ bilaterally. Tenderness to palpation to the plantar aspect of the bilateral heels along the plantar fascia. Limited ROM with ankle joint dorsiflexion bilaterally. Positive Silfverskiold test. Range of motion within normal limits to all pedal and ankle joints bilateral. Muscle strength 5/5 in all groups bilateral.   Assessment: 1. 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 Peripheral neuropathy BLE 3. Plantar fasciitis bilateral 4. Gastroc equinus bilateral    Plan of Care:  1. Patient evaluated. 2. Prescription for Medrol Dose Pak provided to patient. 3. Refill prescription for Diclofenac provided to patient.  4. Recommended good shoe gear.  5. Return to clinic as needed.        Felecia Shelling, DPM [facility]  Dr. Felecia Shelling,  DPM    [address] [place], [place] [address]                Office [phone]  Fax [phone]

## 2020-01-30 ENCOUNTER — Telehealth: Payer: Self-pay

## 2020-01-30 ENCOUNTER — Ambulatory Visit: Payer: Medicaid Other | Admitting: Neurology

## 2020-01-30 NOTE — Telephone Encounter (Signed)
I called pt. No answer, left a message asking pt to call me back.  Pt was advised via vm we need to cancel his 01/30/2020 130 appt due to provider not feeling well. Pt advised to call back and reschedule.

## 2020-02-19 ENCOUNTER — Ambulatory Visit: Payer: Medicaid Other | Admitting: Neurology

## 2020-02-19 ENCOUNTER — Encounter: Payer: Self-pay | Admitting: Neurology

## 2020-02-19 ENCOUNTER — Other Ambulatory Visit: Payer: Self-pay

## 2020-02-19 VITALS — BP 133/85 | HR 67 | Ht 69.5 in | Wt 258.0 lb

## 2020-02-19 DIAGNOSIS — R0600 Dyspnea, unspecified: Secondary | ICD-10-CM

## 2020-02-19 DIAGNOSIS — I471 Supraventricular tachycardia: Secondary | ICD-10-CM | POA: Diagnosis not present

## 2020-02-19 DIAGNOSIS — R0609 Other forms of dyspnea: Secondary | ICD-10-CM

## 2020-02-19 DIAGNOSIS — R1013 Epigastric pain: Secondary | ICD-10-CM | POA: Diagnosis not present

## 2020-02-19 DIAGNOSIS — G4733 Obstructive sleep apnea (adult) (pediatric): Secondary | ICD-10-CM | POA: Diagnosis not present

## 2020-02-19 DIAGNOSIS — Z9989 Dependence on other enabling machines and devices: Secondary | ICD-10-CM

## 2020-02-19 NOTE — Progress Notes (Signed)
PATIENT: Richard Walker DOB: Mar 21, 1956  REASON FOR VISIT: follow up HISTORY FROM: patient  HISTORY OF PRESENT ILLNESS: Today 02/19/20   Richard Walker is a 64 year old male patient from [place], and had Covid 19 last year, his mother in [place] passed away from it last month. Richard Walker has become a very highly compliant CPAP user now that he has recovered from Covid he is a 97% compliant user by hours with an average of 8 hours 2 minutes and 100% of the last 30 days.  His mean pressure is 7.7 cm his 90% pressure is 9.3 cmH2O and his residual AHI is 0.9/h this is an excellent resolution of underlying apnea.  He says he cannot really sleep well without his CPAP he is dependent on it.  Fatigue severity scale is still high 61/ 32 points , and  the Epworth Sleepiness Scale is still elevated at 16/ 24. .     Richard Walker is a 64 year old male with a history of obstructive sleep apnea on CPAP.  He states that he has had a lot change in medical history, is experiencing exhaustion and fatigue all the time. He finds that he has to take a nap every day. He had Covid in August but was minor. He has concerns of dry mouth/waking up frequently during the night. biotene has helped some.  Richard Walker reports that before he contracted Covid he already felt an increasing amount of fatigue and exhaustion but it has worsened with the infection.  At this point the patient's Epworth Sleepiness Scale was endorsed at a high level= 18 out of 24 possible points and a narcoleptic level. The fatigue also affects him at 63 out of 63 possible points.  He reports frontal headaches, fragmented sleep, he is out of job ( at [employer] after 23 years) and can't even do activities around the house. Had SVT and Ablation- 2017- now  Atrial fib relapses, was not on anticoagulation and started.  Started metoprolol. Heart monitor found persistent a fib. He has a sore , dry throat and is always achy. Started fleccanide. [name] did CMET,TSH  and Vit D after she found him Covid positive.  Those numbers looked (good reportedly).  I reviewed the CPAP data: CPAP data shows 100% compliance with an average use at time of 9 hours and 19 minutes, 100% of these days the CPAP was used over 4 hours.  The CPAP 90 percentile pressure is 8.9 cmH2O and his residual AHI is only 1.5/h which shows of good resolution the minimum setting is 5 7 cmH2O the maximum setting is 12 cmH2O and the expiratory pressure relief is 3 cmH2O.  This seems to control the apnea very well.       07-28-2018 He returns today for follow-up.  His download indicates that he uses machine 29 out of 30 days for compliance of 96%.  He uses machine other than 4 hours each night.  On average he uses his machine 8 hours and 9 minutes.  His residual AHI is 2.4.  He is currently on AutoSet with pressure 7 to 12 cm of water.  He does feel his mask leaking.  He has not changed out his supplies in over 6 months.  He returns today for evaluation.  His Epworth sleepiness score is 18, fatigue severity score is 63.    HISTORY Richard Walker reports that he lost his father in December 22, multiple health complications and finally lost his job, his wife also  had to undergo several surgeries.  2018 was not an easy year for him.  He had seen [name] for a nasal septoplasty surgery it does seem to have helped with nasal patency now but he had some hiccups on the way including epistaxis, and it took a long time to see the mucous lining finally swelled down.  He is now able to use CPAP with good compliance again, his compliance is 90% plus average use of time is 7 hours and 12 minutes, he has used  Auto CPAP with a mean pressure of 8.4 cmH2O , the average AHI residual is 1.9/hr. , no changes in settings have to be done.  Minimum pressure is 7, maximum pressure is 12 cmH2O with 3 cm EPR ramp starts at 4 cmH2O.  He has seen his cardiologist, [name], who has treated him for SVTs by Ablation.  His  tachycardia subsided for about 4 months but then after the ablation did come back.  [name] suspect that there is another causative problem.  May be acid reflux, may be apnea -however his apnea should not be a risk factor at this successful resolution. Underwent  upper Gi/ gastroscopy with no abnormalities. He now presents for SOB, coughing and palpitation- he likes to use his CPAP as it helps to prevent tachycardia and palpitations. Chest X ray was taken on 1-7 and he stated he has not been given results. There is an Epic note, small lung volume old scarring. described productive cough -?  Mucopurulent bronchitis ?Richard Walker On doxicyline and mucinex.     REVIEW OF SYSTEMS: Out of a complete 14 system review of symptoms, the patient complains only of the following symptoms, and all other reviewed systems are negative.  See HPI  ALLERGIES: Allergies  Allergen Reactions  . Tree Extract   . Pork-Derived Products Swelling  . Statins     Muscle aches    HOME MEDICATIONS: Outpatient Medications Prior to Visit  Medication Sig Dispense Refill  . acetaminophen (TYLENOL) 325 MG tablet Take 2 tablets (650 mg total) by mouth every 6 (six) hours.    Richard Walker albuterol (PROAIR HFA) 108 (90 Base) MCG/ACT inhaler Inhale 1-2 puffs into the lungs every 6 (six) hours as needed for wheezing or shortness of breath. 1 Inhaler 0  . azelastine (ASTELIN) 0.1 % nasal spray 1-2 sprays per nostril 2 times daily as needed. 30 mL 5  . budesonide-formoterol (SYMBICORT) 160-4.5 MCG/ACT inhaler Inhale 2 puffs into the lungs 2 (two) times daily. 1 Inhaler 0  . cyclobenzaprine (FLEXERIL) 5 MG tablet Take 1 tablet (5 mg total) by mouth 3 (three) times daily as needed for muscle spasms. 30 tablet 1  . diclofenac (VOLTAREN) 75 MG EC tablet Take 1 tablet (75 mg total) by mouth 2 (two) times daily. 90 tablet 2  . ezetimibe (ZETIA) 10 MG tablet Take 1 tablet (10 mg total) by mouth daily. 30 tablet 11  . famotidine-calcium  carbonate-magnesium hydroxide (PEPCID COMPLETE) 10-800-165 MG chewable tablet Chew by mouth.    . fenofibrate (TRICOR) 145 MG tablet Take 1 tablet by mouth daily.    . flecainide (TAMBOCOR) 150 MG tablet TAKE 1 TABLET(150 MG) BY MOUTH TWICE DAILY 60 tablet 5  . fluticasone (FLONASE) 50 MCG/ACT nasal spray Place 1 spray into both nostrils daily. 16 g 0  . gabapentin (NEURONTIN) 100 MG capsule Take 1 capsule (100 mg total) by mouth 3 (three) times daily. 90 capsule 1  . ibuprofen (ADVIL) 200 MG tablet Take  3 tablets (600 mg total) by mouth every 6 (six) hours.    Richard Walker levocetirizine (XYZAL) 5 MG tablet TAKE 1 TABLET(5 MG) BY MOUTH EVERY EVENING 90 tablet 0  . meloxicam (MOBIC) 15 MG tablet Take 1 tablet (15 mg total) by mouth daily. 30 tablet 1  . metFORMIN (GLUCOPHAGE) 500 MG tablet Take 1 tablet (500 mg total) by mouth 2 (two) times daily with a meal. 60 tablet 3  . methylPREDNISolone (MEDROL DOSEPAK) 4 MG TBPK tablet 6 day dose pack - take as directed 21 tablet 0  . metoprolol tartrate (LOPRESSOR) 25 MG tablet TAKE 1 TABLET(25 MG) BY MOUTH TWICE DAILY 180 tablet 1  . montelukast (SINGULAIR) 10 MG tablet Take 1 tablet once at night for coughing or wheezing. 30 tablet 1  . pantoprazole (PROTONIX) 40 MG tablet Take 1 tablet (40 mg total) by mouth 2 (two) times daily. 60 tablet 0  . PRESCRIPTION MEDICATION Inhale into the lungs at bedtime. CPAP    . rivaroxaban (XARELTO) 20 MG TABS tablet Take 1 tablet (20 mg total) by mouth daily with supper. 90 tablet 3  . sildenafil (REVATIO) 20 MG tablet Take 1-2 tablets (20-40 mg total) by mouth as needed. 60 tablet 1  . Vitamin D, Ergocalciferol, (DRISDOL) 1.25 MG (50000 UT) CAPS capsule Take 1 capsule (50,000 Units total) by mouth every 7 (seven) days. 4 capsule 0   No facility-administered medications prior to visit.    PAST MEDICAL HISTORY: Past Medical History:  Diagnosis Date  . DDD (degenerative disc disease), lumbar    L3/L4  . Diverticulosis   .  GERD (gastroesophageal reflux disease)   . Hyperlipidemia   . Low sperm motility   . Pre-diabetes   . Sleep apnea    uses cpap   . SVT (supraventricular tachycardia) (HCC)     PAST SURGICAL HISTORY: Past Surgical History:  Procedure Laterality Date  . 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 REPAIR OF LEFT INDEX AND LONG FINGER BY FUSION /  REPAIR OF RING FINGER;  Surgeon: Mack Hook, MD;  Location: [facility] OR;  Service: Orthopedics;  Laterality: Left;  . BACK SURGERY    . CARDIAC CATHETERIZATION N/A 12/22/2015   Procedure: Left Heart Cath and Coronary Angiography;  Surgeon: Peter M [place], MD;  Location: [facility] INVASIVE CV LAB;  Service: Cardiovascular;  Laterality: N/A;  . CARDIAC ELECTROPHYSIOLOGY STUDY AND ABLATION    . ELECTROPHYSIOLOGIC STUDY N/A 02/19/2016   Procedure: SVT Ablation;  Surgeon: Marinus Maw, MD;  Location: [facility] INVASIVE CV LAB;  Service: Cardiovascular;  Laterality: N/A;  . septal reconstruction  2017  . TURBINATE REDUCTION Bilateral 07/2016  . WISDOM TOOTH EXTRACTION      FAMILY HISTORY: Family History  Problem Relation Age of Onset  . Hypertension Father   . Food Allergy Son   . Colon cancer Neg Hx   . Stomach cancer Neg Hx   . Allergic rhinitis Neg Hx   . Angioedema Neg Hx   . Asthma Neg Hx   . Eczema Neg Hx   . Immunodeficiency Neg Hx   . Urticaria Neg Hx     SOCIAL HISTORY: Social History   Socioeconomic History  . Marital status: Married    Spouse name: Not on file  . Number of children: Not on file  . Years of education: Not on file  . Highest education level: Not on file  Occupational History  . Not on file  Tobacco Use  . Smoking status: Former Smoker  Packs/day: 1.00    Years: 10.00    Pack years: 10.00    Types: Cigarettes    Quit date: 36    Years since quitting: 29.5  . Smokeless tobacco: Never Used  . Tobacco comment: Uses Hookah Pipe occasionally  Vaping Use  . Vaping Use: Never used  Substance and Sexual Activity  .  Alcohol use: No  . Drug use: No  . Sexual activity: Yes  Other Topics Concern  . Not on file  Social History Narrative   Marital Status: Married Engineer, drilling)   Children:  4 (3 Sons/1 Daughter)   Pets:  None    Living Situation: Lives with spouse and 4 children   Origin:  He was born in [place].   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     Alcohol Use:  None   Drug Use:  None   Diet:  Regular   Exercise:  None   Hobbies:  Cards   Social Determinants of Health   Financial Resource Strain:   . Difficulty of Paying Living Expenses:   Food Insecurity:   . Worried About Programme researcher, broadcasting/film/video in the Last Year:   . Barista in the Last Year:   Transportation Needs:   . Freight forwarder (Medical):   Richard Walker Lack of Transportation (Non-Medical):   Physical Activity:   . Days of Exercise per Week:   . Minutes of Exercise per Session:   Stress:   . Feeling of Stress :   Social Connections:   . Frequency of Communication with Friends and Family:   . Frequency of Social Gatherings with Friends and Family:   . Attends Religious Services:   . Active Member of Clubs or Organizations:   . Attends Banker Meetings:   Richard Walker Marital Status:   Intimate Partner Violence:   . Fear of Current or Ex-Partner:   . Emotionally Abused:   Richard Walker Physically Abused:   . Sexually Abused:       PHYSICAL EXAM  Vitals:   02/19/20 0817  BP: 133/85  Pulse: 67  Weight: 258 lb (117 kg)  Height: 5' 9.5" (1.765 m)   Body mass index is 37.55 kg/m.  Generalized: Well developed, in no acute distress   Regular rate , NSR- no murmur.   Neurological examination  Mentation: Alert oriented to time, place, history taking. Follows all commands speech and language fluent Cranial nerve :  Lost smell and taste for abut 7 days, now has not reached baseline level.   Extraocular movements were full, visual field were full on confrontational test. Facial sensation and strength were normal. Uvula and tongue in midline.  Neck ROM and shoulder shrug  were normal and symmetric. Motor: The motor testing reveals 5 /5 strength of all 4 extremities.  Symmetric motor tone is noted throughout. Neck circumference 18.5 inches, Mallampati 4+ .  Sensory: Sensory testing is intact to soft touch on all 4 extremities.  No evidence of extinction is noted. Coordination:  good finger-nose- bilaterally.  Gait and station: Gait is intact. He has left knee pain.      DIAGNOSTIC DATA (LABS, IMAGING, TESTING) - I reviewed patient records, labs, notes, testing and imaging myself where available.  Lab Results  Component Value Date   WBC 7.7 07/15/2018   HGB 16.3 07/15/2018   HCT 50.8 07/15/2018   MCV 87.1 07/15/2018  PLT 216 07/15/2018      Component Value Date/Time   NA 139 08/22/2018 0748   K 4.1 08/22/2018 0748   CL 102 08/22/2018 0748   CO2 28 08/22/2018 0748   GLUCOSE 94 08/22/2018 0748   BUN 18 08/22/2018 0748   CREATININE 1.09 08/22/2018 0748   CREATININE 1.18 02/12/2016 1508   CALCIUM 9.5 08/22/2018 0748   PROT 6.7 08/22/2018 0748   ALBUMIN 4.2 08/22/2018 0748   AST 13 08/22/2018 0748   ALT 19 08/22/2018 0748   ALKPHOS 80 08/22/2018 0748   BILITOT 0.5 08/22/2018 0748   GFRNONAA >60 07/15/2018 1324   GFRNONAA >89 03/04/2014 1456   GFRAA >60 07/15/2018 1324   GFRAA >89 03/04/2014 1456   Lab Results  Component Value Date   CHOL 211 (H) 08/22/2018   HDL 36.50 (L) 08/22/2018   LDLCALC 162 (H) 03/04/2014   LDLDIRECT 142.0 08/22/2018   TRIG 266.0 (H) 08/22/2018   CHOLHDL 6 08/22/2018   Lab Results  Component Value Date   HGBA1C 5.8 06/30/2018   Lab Results  Component Value Date   VITAMINB12 304 06/28/2018   Lab Results  Component Value Date   TSH 1.23 06/28/2018      ASSESSMENT AND PLAN 64 y.o. year old male  has a past medical history of  DDD (degenerative disc disease), lumbar, Diverticulosis, GERD (gastroesophageal reflux disease), Hyperlipidemia, Low sperm motility, Pre-diabetes, Sleep apnea, and SVT (supraventricular tachycardia) (HCC). here with :  I encouraged the patient to get vaccinated. COVID 19 with mRNA vaccine.   1.  Obstructive sleep apnea on CPAP, well controlled. AHI of 0.9/his a very good resolution. Hgh compliance.  2.  Extreme fatigue and hypersomnia since beginning of the year, atrial fibrillation - can be causing fatigue. He is still EDS and fatigued. His pulse was regular today- he is status post ablation.  3.  Post -Covid anosmia and ageusia- myalgia and fatigue. Post viral symptoms have persisted - have partially improved.     The patient CPAP download shows excellent compliance and good treatment of his apnea.  He is encouraged to continue using the CPAP nightly and greater than 4 hours each night.  An order for new supplies has been sent to his DME company. He is advised to consider cardiac ablation/   He will follow-up in 12  month  or sooner if needed. I will send a compliance reports to [facility]- adapt- he needs supplies.    He needs no supplies now- has a recently replaced  heated hose, new mask liners, head gear , CPAP  filter, new watertank.   He continues to use his own pulse-oximeter some nights.   I spent 20 minutes with the patient. 50% of this time was spent reviewing his CPAP download   Melvyn Novas, MD  02/19/2020, 8:40 AM [facility] [address] [place], [place] [address] [phone]

## 2020-02-19 NOTE — Patient Instructions (Signed)

## 2020-02-27 ENCOUNTER — Encounter: Payer: Self-pay | Admitting: Neurology

## 2020-03-04 ENCOUNTER — Telehealth: Payer: Self-pay | Admitting: Neurology

## 2020-03-04 ENCOUNTER — Encounter: Payer: Self-pay | Admitting: Neurology

## 2020-03-04 NOTE — Telephone Encounter (Signed)
I called pt. I discussed his ONO on auto pap results with him. Pt verbalized understanding of results. Pt had no questions at this time but was encouraged to call back if questions arise.

## 2020-03-04 NOTE — Telephone Encounter (Signed)
Received a overnight oximetry report that was completed for patient on CPAP. I will have Dr Frances Furbish, another sleep specialist review the information and advise what to tell the patient. Will call result to patient once read.

## 2020-03-04 NOTE — Telephone Encounter (Signed)
I reviewed the patient's overnight pulse oximetry test results, patient was on CPAP at the time.  Total duration of test was 8 hours and 39 minutes, average oxygen saturation 93%, nadir was 88%, time below are at 88% saturation was 1 minute and 34 seconds.    Please call patient and advise him that while he is on CPAP therapy his oxygen saturations are adequate with minor desaturations noted, no lower than 88% which is acceptable.  As it stands, he does not have to consider adding supplemental oxygen.  He is advised to follow-up routinely as scheduled with Dr. Vickey Huger.

## 2020-05-07 ENCOUNTER — Other Ambulatory Visit: Payer: Self-pay

## 2020-05-07 ENCOUNTER — Ambulatory Visit: Payer: Medicare Other | Admitting: Internal Medicine

## 2020-05-07 ENCOUNTER — Encounter: Payer: Self-pay | Admitting: Internal Medicine

## 2020-05-07 VITALS — BP 126/88 | HR 73 | Ht 69.5 in | Wt 255.0 lb

## 2020-05-07 DIAGNOSIS — I4892 Unspecified atrial flutter: Secondary | ICD-10-CM

## 2020-05-07 DIAGNOSIS — I480 Paroxysmal atrial fibrillation: Secondary | ICD-10-CM

## 2020-05-07 MED ORDER — METOPROLOL TARTRATE 25 MG PO TABS
ORAL_TABLET | ORAL | 3 refills | Status: DC
Start: 2020-05-07 — End: 2020-11-03

## 2020-05-07 MED ORDER — FLECAINIDE ACETATE 150 MG PO TABS
150.0000 mg | ORAL_TABLET | Freq: Two times a day (BID) | ORAL | 3 refills | Status: DC
Start: 2020-05-07 — End: 2020-11-03

## 2020-05-07 NOTE — Patient Instructions (Addendum)
Medication Instructions:  Your physician recommends that you continue on your current medications as directed. Please refer to the Current Medication list given to you today.  1.  Take metoprolol as directed per Dr. Laqueta Jean: None ordered.  Testing/Procedures: None ordered.  Follow-Up: Your physician wants you to follow-up in: 6 months with [name].    November 04, 2019 at 2:15 pm at the [facility] office  Any Other Special Instructions Will Be Listed Below (If Applicable).  If you need a refill on your cardiac medications before your next appointment, please call your pharmacy.

## 2020-05-07 NOTE — Progress Notes (Signed)
HPI [name] returns today for evaluation of his atrial arrhythmias. He is a pleasant 64 yo man with a h/o SVT who underwent catheter ablation who then developed atrial fib. He has been treated with flecainide. He developed atrial flutter. We discussed ablation but he declined. He has been on medical therapy. He notes some palpitations. He ran out of his flecainide. He is still taking a beta blocker. He had Covid a year ago. He has not been vaccinated. He denies chest pain or sob.  Allergies  Allergen Reactions  . Tree Extract   . Pork-Derived Products Swelling  . Statins     Muscle aches     Current Outpatient Medications  Medication Sig Dispense Refill  . acetaminophen (TYLENOL) 325 MG tablet Take 2 tablets (650 mg total) by mouth every 6 (six) hours.    [name] albuterol (PROAIR HFA) 108 (90 Base) MCG/ACT inhaler Inhale 1-2 puffs into the lungs every 6 (six) hours as needed for wheezing or shortness of breath. 1 Inhaler 0  . azelastine (ASTELIN) 0.1 % nasal spray 1-2 sprays per nostril 2 times daily as needed. 30 mL 5  . budesonide-formoterol (SYMBICORT) 160-4.5 MCG/ACT inhaler Inhale 2 puffs into the lungs 2 (two) times daily. 1 Inhaler 0  . cyclobenzaprine (FLEXERIL) 5 MG tablet Take 1 tablet (5 mg total) by mouth 3 (three) times daily as needed for muscle spasms. 30 tablet 1  . diclofenac (VOLTAREN) 75 MG EC tablet Take 1 tablet (75 mg total) by mouth 2 (two) times daily. 90 tablet 2  . ezetimibe (ZETIA) 10 MG tablet Take 1 tablet (10 mg total) by mouth daily. 30 tablet 11  . famotidine-calcium carbonate-magnesium hydroxide (PEPCID COMPLETE) 10-800-165 MG chewable tablet Chew by mouth.    . fenofibrate (TRICOR) 145 MG tablet Take 1 tablet by mouth daily.    . flecainide (TAMBOCOR) 150 MG tablet Take 1 tablet (150 mg total) by mouth 2 (two) times daily. 180 tablet 3  . fluticasone (FLONASE) 50 MCG/ACT nasal spray Place 1 spray into both nostrils daily. 16 g 0  . gabapentin  (NEURONTIN) 100 MG capsule Take 1 capsule (100 mg total) by mouth 3 (three) times daily. 90 capsule 1  . ibuprofen (ADVIL) 200 MG tablet Take 3 tablets (600 mg total) by mouth every 6 (six) hours.    [name] levocetirizine (XYZAL) 5 MG tablet TAKE 1 TABLET(5 MG) BY MOUTH EVERY EVENING 90 tablet 0  . Magnesium 250 MG TABS Take 1 tablet by mouth once a week.    . meloxicam (MOBIC) 15 MG tablet Take 1 tablet (15 mg total) by mouth daily. 30 tablet 1  . metFORMIN (GLUCOPHAGE) 500 MG tablet Take 1 tablet (500 mg total) by mouth 2 (two) times daily with a meal. 60 tablet 3  . methylPREDNISolone (MEDROL DOSEPAK) 4 MG TBPK tablet 6 day dose pack - take as directed 21 tablet 0  . metoprolol tartrate (LOPRESSOR) 25 MG tablet Take as directed 270 tablet 3  . montelukast (SINGULAIR) 10 MG tablet Take 1 tablet once at night for coughing or wheezing. 30 tablet 1  . pantoprazole (PROTONIX) 40 MG tablet Take 1 tablet (40 mg total) by mouth 2 (two) times daily. 60 tablet 0  . PRESCRIPTION MEDICATION Inhale into the lungs at bedtime. CPAP    . rivaroxaban (XARELTO) 20 MG TABS tablet Take 1 tablet (20 mg total) by mouth daily with supper. 90 tablet 3  . sildenafil (REVATIO) 20 MG tablet Take  1-2 tablets (20-40 mg total) by mouth as needed. 60 tablet 1  . Vitamin D, Ergocalciferol, (DRISDOL) 1.25 MG (50000 UT) CAPS capsule Take 1 capsule (50,000 Units total) by mouth every 7 (seven) days. 4 capsule 0   No current facility-administered medications for this visit.     Past Medical History:  Diagnosis Date  . DDD (degenerative disc disease), lumbar    L3/L4  . Diverticulosis   . GERD (gastroesophageal reflux disease)   . Hyperlipidemia   . Low sperm motility   . Pre-diabetes   . Sleep apnea    uses cpap   . SVT (supraventricular tachycardia) (HCC)     ROS:   All systems reviewed and negative except as noted in the HPI.   Past Surgical History:  Procedure Laterality Date  . 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 REPAIR OF LEFT INDEX AND LONG FINGER BY FUSION /  REPAIR OF RING FINGER;  Surgeon: Mack Hook, MD;  Location: [facility] OR;  Service: Orthopedics;  Laterality: Left;  . BACK SURGERY    . CARDIAC CATHETERIZATION N/A 12/22/2015   Procedure: Left Heart Cath and Coronary Angiography;  Surgeon: Peter M Swaziland, MD;  Location: [facility] INVASIVE CV LAB;  Service: Cardiovascular;  Laterality: N/A;  . CARDIAC ELECTROPHYSIOLOGY STUDY AND ABLATION    . ELECTROPHYSIOLOGIC STUDY N/A 02/19/2016   Procedure: SVT Ablation;  Surgeon: Marinus Maw, MD;  Location: [facility] INVASIVE CV LAB;  Service: Cardiovascular;  Laterality: N/A;  . septal reconstruction  2017  . TURBINATE REDUCTION Bilateral 07/2016  . WISDOM TOOTH EXTRACTION       Family History  Problem Relation Age of Onset  . Hypertension Father   . Food Allergy Son   . Colon cancer Neg Hx   . Stomach cancer Neg Hx   . Allergic rhinitis Neg Hx   . Angioedema Neg Hx   . Asthma Neg Hx   . Eczema Neg Hx   . Immunodeficiency Neg Hx   . Urticaria Neg Hx      Social History   Socioeconomic History  . Marital status: Married    Spouse name: Not on file  . Number of children: Not on file  . Years of education: Not on file  . Highest education level: Not on file  Occupational History  . Not on file  Tobacco Use  . Smoking status: Former Smoker    Packs/day: 1.00    Years: 10.00    Pack years: 10.00    Types: Cigarettes    Quit date: 1992    Years since quitting: 29.7  . Smokeless tobacco: Never Used  . Tobacco comment: Uses Hookah Pipe occasionally  Vaping Use  . Vaping Use: Never used  Substance and Sexual Activity  . Alcohol use: No  . Drug use: No  . Sexual activity: Yes  Other Topics Concern  . Not on file  Social History Narrative   Marital Status: Married Engineer, drilling)   Children:  4 (3 Sons/1 Daughter)   Pets:  None    Living Situation: Lives with spouse and 4 children   Origin:  He was born in [place].   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     Alcohol Use:  None   Drug Use:  None   Diet:  Regular   Exercise:  None   Hobbies:  Cards   Social Determinants of  Health   Financial Resource Strain:   . Difficulty of Paying Living Expenses: Not on file  Food Insecurity:   . Worried About Programme researcher, broadcasting/film/video in the Last Year: Not on file  . Ran Out of Food in the Last Year: Not on file  Transportation Needs:   . Lack of Transportation (Medical): Not on file  . Lack of Transportation (Non-Medical): Not on file  Physical Activity:   . Days of Exercise per Week: Not on file  . Minutes of Exercise per Session: Not on file  Stress:   . Feeling of Stress : Not on file  Social Connections:   . Frequency of Communication with Friends and Family: Not on file  . Frequency of Social Gatherings with Friends and Family: Not on file  . Attends Religious Services: Not on file  . Active Member of Clubs or Organizations: Not on file  . Attends Banker Meetings: Not on file  . Marital Status: Not on file  Intimate Partner Violence:   . Fear of Current or Ex-Partner: Not on file  . Emotionally Abused: Not on file  . Physically Abused: Not on file  . Sexually Abused: Not on file     BP 126/88   Pulse 73   Ht 5' 9.5" (1.765 m)   Wt 255 lb (115.7 kg)   SpO2 97%   BMI 37.12 kg/m   Physical Exam:  Well appearing NAD HEENT: Unremarkable Neck:  No JVD, no thyromegally Lymphatics:  No adenopathy Back:  No CVA tenderness Lungs:  Clear with no wheezes HEART:  Regular rate rhythm, no murmurs, no rubs, no clicks Abd:  soft, positive bowel sounds, no organomegally, no rebound, no guarding Ext:  2 plus pulses, no edema, no cyanosis, no clubbing Skin:  No rashes no nodules Neuro:  CN II through XII intact, motor grossly intact  EKG - nsr  Assess/Plan: 1. Atrial  flutter/fib - I have discussed the treatment options. I have recommended he continue his flecainide and beta blocker. If he has break through arrhythmia, then I have instructed him to take additional metoprolol but not additional flecainide.  2. HTN - his bp today is well controlled. We will follow.  Sharlot Gowda Meriah Shands,MD

## 2020-05-28 ENCOUNTER — Other Ambulatory Visit: Payer: Self-pay

## 2020-05-28 ENCOUNTER — Encounter: Payer: Self-pay | Admitting: Pulmonary Disease

## 2020-05-28 ENCOUNTER — Ambulatory Visit (INDEPENDENT_AMBULATORY_CARE_PROVIDER_SITE_OTHER): Payer: Medicare Other

## 2020-05-28 ENCOUNTER — Ambulatory Visit (INDEPENDENT_AMBULATORY_CARE_PROVIDER_SITE_OTHER): Payer: Medicare Other | Admitting: Pulmonary Disease

## 2020-05-28 VITALS — BP 128/80 | HR 64 | Temp 97.0°F | Ht 68.0 in | Wt 260.2 lb

## 2020-05-28 DIAGNOSIS — R0609 Other forms of dyspnea: Secondary | ICD-10-CM

## 2020-05-28 DIAGNOSIS — J3089 Other allergic rhinitis: Secondary | ICD-10-CM | POA: Diagnosis not present

## 2020-05-28 DIAGNOSIS — R062 Wheezing: Secondary | ICD-10-CM

## 2020-05-28 DIAGNOSIS — R0600 Dyspnea, unspecified: Secondary | ICD-10-CM

## 2020-05-28 DIAGNOSIS — J4550 Severe persistent asthma, uncomplicated: Secondary | ICD-10-CM

## 2020-05-28 LAB — COMPREHENSIVE METABOLIC PANEL
ALT: 20 U/L (ref 0–53)
AST: 18 U/L (ref 0–37)
Albumin: 4.4 g/dL (ref 3.5–5.2)
Alkaline Phosphatase: 52 U/L (ref 39–117)
BUN: 21 mg/dL (ref 6–23)
CO2: 30 mEq/L (ref 19–32)
Calcium: 9.8 mg/dL (ref 8.4–10.5)
Chloride: 101 mEq/L (ref 96–112)
Creatinine, Ser: 1.23 mg/dL (ref 0.40–1.50)
GFR: 61.68 mL/min (ref >60.00)
Glucose, Bld: 89 mg/dL (ref 70–99)
Potassium: 4 mEq/L (ref 3.5–5.1)
Sodium: 138 mEq/L (ref 135–145)
Total Bilirubin: 0.5 mg/dL (ref 0.2–1.2)
Total Protein: 7.5 g/dL (ref 6.0–8.3)

## 2020-05-28 LAB — CBC WITH DIFFERENTIAL/PLATELET
Basophils Absolute: 0 10*3/uL (ref 0.0–0.1)
Basophils Relative: 0.7 % (ref 0.0–3.0)
Eosinophils Absolute: 0.1 10*3/uL (ref 0.0–0.7)
Eosinophils Relative: 1.4 % (ref 0.0–5.0)
HCT: 48.3 % (ref 39.0–52.0)
Hemoglobin: 16 g/dL (ref 13.0–17.0)
Lymphocytes Relative: 29.4 % (ref 12.0–46.0)
Lymphs Abs: 2.1 10*3/uL (ref 0.7–4.0)
MCHC: 33 g/dL (ref 30.0–36.0)
MCV: 86.7 fl (ref 78.0–100.0)
Monocytes Absolute: 0.5 10*3/uL (ref 0.1–1.0)
Monocytes Relative: 7.1 % (ref 3.0–12.0)
Neutro Abs: 4.4 10*3/uL (ref 1.4–7.7)
Neutrophils Relative %: 61.4 % (ref 43.0–77.0)
Platelets: 209 10*3/uL (ref 150.0–400.0)
RBC: 5.57 Mil/uL (ref 4.22–5.81)
RDW: 14.5 % (ref 11.5–15.5)
WBC: 7.2 10*3/uL (ref 4.0–10.5)

## 2020-05-28 MED ORDER — MONTELUKAST SODIUM 10 MG PO TABS: ORAL_TABLET | ORAL | 5 refills | Status: DC

## 2020-05-28 MED ORDER — FLUTICASONE PROPIONATE 50 MCG/ACT NA SUSP
1.0000 | Freq: Every day | NASAL | 5 refills | Status: DC
Start: 2020-05-28 — End: 2020-09-15

## 2020-05-28 MED ORDER — AZELASTINE HCL 0.1 % NA SOLN: NASAL | 5 refills | Status: DC

## 2020-05-28 MED ORDER — BUDESONIDE-FORMOTEROL FUMARATE 160-4.5 MCG/ACT IN AERO
2.0000 | INHALATION_SPRAY | Freq: Two times a day (BID) | RESPIRATORY_TRACT | 5 refills | Status: DC
Start: 2020-05-28 — End: 2020-09-15

## 2020-05-28 MED ORDER — ALBUTEROL SULFATE HFA 108 (90 BASE) MCG/ACT IN AERS: 1.0000 | INHALATION_SPRAY | Freq: Four times a day (QID) | RESPIRATORY_TRACT | 5 refills | Status: DC | PRN

## 2020-05-28 NOTE — Progress Notes (Signed)
[facility]  Chief Complaint  Patient presents with  . Consult    SOB increased, CPAP nightly     Constitutional:  BP 128/80 (BP Location: Right Arm, Cuff Size: Normal)   Pulse 64   Temp (!) 97 F (36.1 C) (Temporal)   Ht 5\' 8"  (1.727 m)   Wt 260 lb 3.2 oz (118 kg)   SpO2 94%   BMI 39.56 kg/m   Past Medical History:  SVT, Atrial fibrillation/flutter, OSA  Past Surgical History:  His  has a past surgical history that includes Back surgery; Wisdom tooth extraction; Cardiac catheterization (N/A, 12/22/2015); Cardiac catheterization (N/A, 02/19/2016); Cardiac electrophysiology study and ablation; septal reconstruction (2017); Turbinate reduction (Bilateral, 07/2016); and Amputation (Left, 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  Brief Summary:  Richard Walker is a 64 y.o. male former smoker with dyspnea and cough.       Subjective:   He had influenza in February 2019.  He developed dyspnea, and cough.  I last saw him in September 2019.  He tested positive for COVID 19 in August 2020.  He had COVID 19 antibody testing in December 2020 that was positive.  He feels more fatigued.  He gets short of breath while walking.  His activity level is also limited by knee and foot pain.  He uses inhalers and these help.  Has intermittent cough.  Not having wheeze or sputum.  Gets itchy eyes, but not having as much sinus congestion or drainage.  No skin rash or leg swelling.    Uses CPAP nightly.  Had ONO recently through [facility] that looked okay.  His recent download showed average CPAP pressure of 8 cm H2O and average AHI 0.9 with 8 hrs 2 min use per night.    He can feel when his heart rhythm flips into SVT or A fib.  He hasn't notice this recently.  Physical Exam:   Appearance - well kempt   ENMT - no sinus tenderness, no oral exudate, no LAN, Mallampati 3 airway, no stridor  Respiratory - equal breath sounds bilaterally, no wheezing or rales  CV - s1s2 regular rate and  rhythm, no murmurs  Ext - no clubbing, no edema  Skin - no rashes  Psych - normal mood and affect   Pulmonary testing:   PFT 10/31/17 >> FEV1 2.29 64%), FEV1% 83, TLC 5.59 (79%), DLCO 77%  Chest Imaging:   HRCT chest 10/21/17 >> atherosclerosis, scarring RML and lingula, hepatic steatosis  Sleep Tests:   PSG 12/29/15 >> AHI 69.8, SpO2 low 89%  ONO with CPAP 05/31/18 >> test time 8 hrs 54 min.  Baseline SpO2 95.9%, low SpO2 93%.  Cardiac Tests:   [facility] 12/22/15 >> mild, non obstructive CAD, normal LV fx  CPST 05/24/18 >> submaximal effort, mild chronotropic incompetence  Echo 10/31/18 >> EF 55 to 60%  Social History:  He  reports that he quit smoking about 29 years ago. His smoking use included cigarettes. He has a 10.00 pack-year smoking history. He has never used smokeless tobacco. He reports that he does not drink alcohol and does not use drugs.  Family History:  His family history includes Food Allergy in his son; Hypertension in his father.    Discussion:  He has persistent dyspnea on exertion.  Previously thought to be related to combination of allergic asthma, obesity with deconditioning, and a fib/flutter.  His symptoms seems to have progressed since he had COVID 19 infection in August 2020.  Assessment/Plan:  Dyspnea on exertion. - will arrange for CBC with diff, CMET, IgE, CXR, PFT, and 6 minute walk test - based on above test results will determine if he needs additional test (HRCT chest and/or CPST), or if there is enough information to adjust therapies  Moderate persistent asthma. - CBC with diff, IgE, PFT, CXR - continue symbicort, singulair, prn albuterol - if symptoms persist, then might need to consider biologic agent  Perennial and seasonal allergic rhinitis. - previously seen by Dr. Nunzio Cobbs with Asthma/Allergy (last in March 2019) - reaction to grass pollen, tree pollen, cockroach antigen, and dust mite antigen - continue astelin, flonase, singulair,  xyzal  Obstructive sleep apnea. - managed by Dr. Vickey Huger with [facility]  Atrial fibrillation/flutter. - followed by Dr. Lewayne Bunting with [facility]  Time Spent Involved in Patient Care on Day of Examination:  49 minutes  Follow up:  Patient Instructions  Chest xray and lab work today Will schedule pulmonary function test and 6 minute walk test  Follow up in 3 weeks   Medication List:   Allergies as of 05/28/2020      Reactions   Tree Extract    Pork-derived Products Swelling   Statins    Muscle aches      Medication List       Accurate as of May 28, 2020 11:49 AM. If you have any questions, ask your nurse or doctor.        acetaminophen 325 MG tablet Commonly known as: Tylenol Take 2 tablets (650 mg total) by mouth every 6 (six) hours.   albuterol 108 (90 Base) MCG/ACT inhaler Commonly known as: ProAir HFA Inhale 1-2 puffs into the lungs every 6 (six) hours as needed for wheezing or shortness of breath.   azelastine 0.1 % nasal spray Commonly known as: ASTELIN 1-2 sprays per nostril 2 times daily as needed.   budesonide-formoterol 160-4.5 MCG/ACT inhaler Commonly known as: Symbicort Inhale 2 puffs into the lungs 2 (two) times daily.   cyclobenzaprine 5 MG tablet Commonly known as: FLEXERIL Take 1 tablet (5 mg total) by mouth 3 (three) times daily as needed for muscle spasms.   diclofenac 75 MG EC tablet Commonly known as: VOLTAREN Take 1 tablet (75 mg total) by mouth 2 (two) times daily.   ezetimibe 10 MG tablet Commonly known as: ZETIA Take 1 tablet (10 mg total) by mouth daily.   famotidine-calcium carbonate-magnesium hydroxide 10-800-165 MG chewable tablet Commonly known as: PEPCID COMPLETE Chew by mouth.   fenofibrate 145 MG tablet Commonly known as: TRICOR Take 1 tablet by mouth daily.   flecainide 150 MG tablet Commonly known as: TAMBOCOR Take 1 tablet (150 mg total) by mouth 2 (two) times daily.   fluticasone 50  MCG/ACT nasal spray Commonly known as: FLONASE Place 1 spray into both nostrils daily.   gabapentin 100 MG capsule Commonly known as: NEURONTIN Take 1 capsule (100 mg total) by mouth 3 (three) times daily.   ibuprofen 200 MG tablet Commonly known as: Advil Take 3 tablets (600 mg total) by mouth every 6 (six) hours.   levocetirizine 5 MG tablet Commonly known as: XYZAL TAKE 1 TABLET(5 MG) BY MOUTH EVERY EVENING   Magnesium 250 MG Tabs Take 1 tablet by mouth once a week.   meloxicam 15 MG tablet Commonly known as: MOBIC Take 1 tablet (15 mg total) by mouth daily.   metFORMIN 500 MG tablet Commonly known as: GLUCOPHAGE Take 1 tablet (500 mg total) by mouth 2 (two)  times daily with a meal.   methylPREDNISolone 4 MG Tbpk tablet Commonly known as: MEDROL DOSEPAK 6 day dose pack - take as directed   metoprolol tartrate 25 MG tablet Commonly known as: LOPRESSOR Take as directed   montelukast 10 MG tablet Commonly known as: Singulair Take 1 tablet once at night for coughing or wheezing.   pantoprazole 40 MG tablet Commonly known as: PROTONIX Take 1 tablet (40 mg total) by mouth 2 (two) times daily.   PRESCRIPTION MEDICATION Inhale into the lungs at bedtime. CPAP   rivaroxaban 20 MG Tabs tablet Commonly known as: Xarelto Take 1 tablet (20 mg total) by mouth daily with supper.   sildenafil 20 MG tablet Commonly known as: REVATIO Take 1-2 tablets (20-40 mg total) by mouth as needed.   Vitamin D (Ergocalciferol) 1.25 MG (50000 UNIT) Caps capsule Commonly known as: DRISDOL Take 1 capsule (50,000 Units total) by mouth every 7 (seven) days.       Signature:  Coralyn Helling, MD [facility] Pager - [phone] 05/28/2020, 11:49 AM

## 2020-05-28 NOTE — Patient Instructions (Signed)
Chest xray and lab work today Will schedule pulmonary function test and 6 minute walk test  Follow up in 3 weeks

## 2020-05-29 ENCOUNTER — Telehealth: Payer: Self-pay | Admitting: Pulmonary Disease

## 2020-05-29 LAB — IGE: IgE (Immunoglobulin E), Serum: 151 kU/L — ABNORMAL HIGH (ref <=114)

## 2020-05-29 NOTE — Telephone Encounter (Signed)
CMP Latest Ref Rng & Units 05/28/2020 08/22/2018 07/15/2018  Glucose 70 - 99 mg/dL 89 94 323(F)  BUN 6 - 23 mg/dL 21 18 15   Creatinine 0.40 - 1.50 mg/dL 5.73 2.20  Sodium 135 - 145 mEq/L 138 139 134(L)  Potassium 3.5 - 5.1 mEq/L 4.0 4.1 3.5  Chloride 96 - 112 mEq/L 101 102 103  CO2 19 - 32 mEq/L 30 28 20(L)  Calcium 8.4 - 10.5 mg/dL 9.8 9.5 9.2  Total Protein 6.0 - 8.3 g/dL 7.5 6.7 -  Total Bilirubin 0.2 - 1.2 mg/dL 0.5 0.5 -  Alkaline Phos 39 - 117 U/L 52 80 -  AST 0 - 37 U/L 18 13 -  ALT 0 - 53 U/L 20 19 -     CBC    Component Value Date/Time   WBC 7.2 05/28/2020 1140   RBC 5.57 05/28/2020 1140   HGB 16.0 05/28/2020 1140   HCT 48.3 05/28/2020 1140   PLT 209.0 05/28/2020 1140   MCV 86.7 05/28/2020 1140   MCH 28.0 07/15/2018 1324   MCHC 33.0 05/28/2020 1140   RDW 14.5 05/28/2020 1140   LYMPHSABS 2.1 05/28/2020 1140   MONOABS 0.5 05/28/2020 1140   EOSABS 0.1 05/28/2020 1140   BASOSABS 0.0 05/28/2020 1140    DG Chest 2 View  Result Date: 05/28/2020 CLINICAL DATA:  Disc knee on exertion EXAM: CHEST - 2 VIEW COMPARISON:  07/15/2018 FINDINGS: The heart size and mediastinal contours are within normal limits. Chronically coarsened bilateral interstitial markings suggesting bronchitic type lung changes. Linear left basilar scarring, unchanged. No focal airspace consolidation, pleural effusion, or pneumothorax. The visualized skeletal structures are unremarkable. IMPRESSION: Chronic bronchitic type lung changes. No acute cardiopulmonary findings. Electronically Signed   By: 07/17/2018 D.O.   On: 05/28/2020 16:42     Please let him know his chest xray shows changes consistent with asthma.  Lab tests normal.

## 2020-05-30 NOTE — Telephone Encounter (Signed)
Called and spoke with patient about xray and lab results per Dr Craige Cotta. All questions answered and patient expressed understanding. Confirmed scheduled PFT on 06/24/2020 with patient. Nothing further needed at this time.

## 2020-06-20 ENCOUNTER — Other Ambulatory Visit: Payer: Self-pay | Admitting: Internal Medicine

## 2020-06-20 ENCOUNTER — Other Ambulatory Visit (HOSPITAL_COMMUNITY): Payer: Medicaid Other

## 2020-06-20 NOTE — Telephone Encounter (Signed)
Age 64, weight 118kg, SCr 1.23 on 05/28/20, CrCl > 100 Aflutter indication, last visit Sept 2021

## 2020-06-21 ENCOUNTER — Other Ambulatory Visit (HOSPITAL_COMMUNITY)
Admission: RE | Admit: 2020-06-21 | Discharge: 2020-06-21 | Disposition: A | Discharge status: Home / Self Care | Payer: Medicare Other | Source: Ambulatory Visit | Attending: Pulmonary Disease | Admitting: Pulmonary Disease

## 2020-06-21 DIAGNOSIS — Z20822 Contact with and (suspected) exposure to covid-19: Secondary | ICD-10-CM | POA: Diagnosis not present

## 2020-06-21 DIAGNOSIS — Z01812 Encounter for preprocedural laboratory examination: Secondary | ICD-10-CM | POA: Insufficient documentation

## 2020-06-22 LAB — SARS CORONAVIRUS 2 (TAT 6-24 HRS): SARS Coronavirus 2: NEGATIVE

## 2020-06-24 ENCOUNTER — Ambulatory Visit (INDEPENDENT_AMBULATORY_CARE_PROVIDER_SITE_OTHER): Payer: Medicare Other | Admitting: Pulmonary Disease

## 2020-06-24 ENCOUNTER — Other Ambulatory Visit: Payer: Self-pay

## 2020-06-24 DIAGNOSIS — J4550 Severe persistent asthma, uncomplicated: Secondary | ICD-10-CM

## 2020-06-24 LAB — PULMONARY FUNCTION TEST
DL/VA % pred: 129 %
DL/VA: 5.4 ml/min/mmHg/L
DLCO cor % pred: 99 %
DLCO cor: 26.89 ml/min/mmHg
DLCO unc % pred: 99 %
DLCO unc: 26.89 ml/min/mmHg
FEF 25-75 Post: 4.73 L/sec
FEF 25-75 Pre: 2.45 L/sec
FEF2575-%Change-Post: 93 %
FEF2575-%Pred-Post: 170 %
FEF2575-%Pred-Pre: 88 %
FEV1-%Change-Post: 28 %
FEV1-%Pred-Post: 82 %
FEV1-%Pred-Pre: 64 %
FEV1-Post: 2.86 L
FEV1-Pre: 2.23 L
FEV1FVC-%Change-Post: 1 %
FEV1FVC-%Pred-Pre: 109 %
FEV6-%Change-Post: 26 %
FEV6-%Pred-Post: 78 %
FEV6-%Pred-Pre: 61 %
FEV6-Post: 3.44 L
FEV6-Pre: 2.72 L
FEV6FVC-%Pred-Post: 105 %
FEV6FVC-%Pred-Pre: 105 %
FVC-%Change-Post: 26 %
FVC-%Pred-Post: 74 %
FVC-%Pred-Pre: 58 %
FVC-Post: 3.44 L
FVC-Pre: 2.72 L
Post FEV1/FVC ratio: 83 %
Post FEV6/FVC ratio: 100 %
Pre FEV1/FVC ratio: 82 %
Pre FEV6/FVC Ratio: 100 %

## 2020-06-24 NOTE — Progress Notes (Signed)
PFT done today. 

## 2020-07-10 ENCOUNTER — Ambulatory Visit (INDEPENDENT_AMBULATORY_CARE_PROVIDER_SITE_OTHER): Payer: Medicare Other

## 2020-07-10 ENCOUNTER — Other Ambulatory Visit: Payer: Self-pay

## 2020-07-10 DIAGNOSIS — R0600 Dyspnea, unspecified: Secondary | ICD-10-CM

## 2020-07-10 DIAGNOSIS — R0609 Other forms of dyspnea: Secondary | ICD-10-CM

## 2020-07-10 NOTE — Progress Notes (Signed)
Six Minute Walk - 07/10/20 1055      Six Minute Walk   Medications taken before test (dose and time) none    Supplemental oxygen during test? No    Lap distance in meters  34 meters    Laps Completed 1    Partial lap (in meters) 11 meters    Baseline BP (sitting) 130/80    Baseline Heartrate 67    Baseline Dyspnea (Borg Scale) 2    Baseline Fatigue (Borg Scale) 1    Baseline SPO2 98 %      End of Test Values    BP (sitting) 128/80    Heartrate 66    Dyspnea (Borg Scale) 2    Fatigue (Borg Scale) 1    SPO2 98 %      2 Minutes Post Walk Values   BP (sitting) 128/80    Heartrate 66    SPO2 98 %    Stopped or paused before six minutes? Yes    Reason: stopped with 4 min and 32 seconds remaining due to knee pain, pt walked with a limp. He had minimal SOB. His knee pain is the reason for stopping.      Interpretation   Distance completed 45 meters

## 2020-07-11 ENCOUNTER — Encounter: Payer: Self-pay | Admitting: Pulmonary Disease

## 2020-07-11 ENCOUNTER — Ambulatory Visit (INDEPENDENT_AMBULATORY_CARE_PROVIDER_SITE_OTHER): Payer: Medicare Other | Admitting: Pulmonary Disease

## 2020-07-11 VITALS — BP 136/80 | HR 66 | Temp 97.3°F | Ht 68.0 in | Wt 268.8 lb

## 2020-07-11 DIAGNOSIS — J4550 Severe persistent asthma, uncomplicated: Secondary | ICD-10-CM

## 2020-07-11 DIAGNOSIS — R079 Chest pain, unspecified: Secondary | ICD-10-CM

## 2020-07-11 DIAGNOSIS — R0609 Other forms of dyspnea: Secondary | ICD-10-CM

## 2020-07-11 DIAGNOSIS — J3089 Other allergic rhinitis: Secondary | ICD-10-CM

## 2020-07-11 DIAGNOSIS — R0600 Dyspnea, unspecified: Secondary | ICD-10-CM | POA: Diagnosis not present

## 2020-07-11 DIAGNOSIS — Z6841 Body Mass Index (BMI) 40.0 and over, adult: Secondary | ICD-10-CM

## 2020-07-11 NOTE — Patient Instructions (Signed)
Will arrange for CT chest  Follow up in 2 months 

## 2020-07-11 NOTE — Progress Notes (Signed)
[facility]  Chief Complaint  Patient presents with  . Follow-up    review PFT, 6 min walk results, fatigue, using cpap, denies problems    Constitutional:  BP 136/80 (BP Location: Left Arm, Cuff Size: Normal)   Pulse 66   Temp (!) 97.3 F (36.3 C) (Skin)   Ht 5\' 8"  (1.727 m)   Wt 268 lb 12.8 oz (121.9 kg)   SpO2 99%   BMI 40.87 kg/m   Past Medical History:  SVT, Atrial fibrillation/flutter, OSA  Past Surgical History:  His  has a past surgical history that includes Back surgery; Wisdom tooth extraction; Cardiac catheterization (N/A, 12/22/2015); Cardiac catheterization (N/A, 02/19/2016); Cardiac electrophysiology study and ablation; septal reconstruction (2017); Turbinate reduction (Bilateral, 07/2016); and Amputation (Left, 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  Brief Summary:  Richard Walker is a 64 y.o. male former smoker with dyspnea and cough.       Subjective:   CXR from 05/28/20 showed bronchitic changes.  IgE from 05/28/20 was 151, Eosinophil 0.1.  PFT showed moderate restriction and positive bronchodilator response.  07/28/20 from 07/10/20 - he maintained SpO2 > 92% on room air but had to stop walk early due to knee pain.  Still has episodes of wheezing and some cough.  Not having congestion.  Has been feeling more fatigued.  Did home COVID test that was negative.  Physical Exam:   Appearance - well kempt   ENMT - no sinus tenderness, no oral exudate, no LAN, Mallampati 3 airway, no stridor  Respiratory - equal breath sounds bilaterally, no wheezing or rales  CV - s1s2 regular rate and rhythm, no murmurs  Ext - no clubbing, no edema  Skin - no rashes  Psych - normal mood and affect    Pulmonary testing:   PFT 10/31/17 >> FEV1 2.29 64%), FEV1% 83, TLC 5.59 (79%), DLCO 77%  IgE 05/28/20 >> 151  PFT 06/24/20 >> FEV1 2.86 (82%), FEV1% 83, TLC 3.98 (56%), DLCO 99%, +BD  Chest Imaging:   HRCT chest 10/21/17 >> atherosclerosis, scarring RML  and lingula, hepatic steatosis  Sleep Tests:   PSG 12/29/15 >> AHI 69.8, SpO2 low 89%  ONO with CPAP 05/31/18 >> test time 8 hrs 54 min.  Baseline SpO2 95.9%, low SpO2 93%.  Cardiac Tests:   [facility] 12/22/15 >> mild, non obstructive CAD, normal LV fx  CPST 05/24/18 >> submaximal effort, mild chronotropic incompetence  Echo 10/31/18 >> EF 55 to 60%  Social History:  He  reports that he quit smoking about 29 years ago. His smoking use included cigarettes. He has a 10.00 pack-year smoking history. He has never used smokeless tobacco. He reports that he does not drink alcohol and does not use drugs.  Family History:  His family history includes Food Allergy in his son; Hypertension in his father.    Discussion:  He has multifactorial dyspnea: allergic asthma, restrictive lung defect, obesity with deconditioning, a fib/flutter with diastolic CHF.  Assessment/Plan:   Restrictive lung disease. - will arrange for high resolution CT chest to further assess   Moderate persistent asthma with allergic profile. - eosinophil level okay - elevated IgE - discussed option of xolair >> he would like to wait until his CT chest is done before deciding about xolair - continue symbicort, singulair, prn albuterol  Perennial and seasonal allergic rhinitis. - previously seen by Dr. 12/31/18 with Asthma/Allergy (last in March 2019) - reaction to grass pollen, tree pollen, cockroach antigen, and dust mite antigen -  continue astelin, flonase, singulair, xyzal  Obstructive sleep apnea. - managed by Dr. Vickey Huger with [facility]  Atrial fibrillation/flutter. - followed by Dr. Lewayne Bunting with [facility]  Time Spent Involved in Patient Care on Day of Examination:  33 minutes  Follow up:  Patient Instructions  Will arrange for CT chest  Follow up in 2 months   Medication List:   Allergies as of 07/11/2020      Reactions   Tree Extract    Pork-derived Products Swelling   Statins     Muscle aches      Medication List       Accurate as of July 11, 2020  9:50 AM. If you have any questions, ask your nurse or doctor.        acetaminophen 325 MG tablet Commonly known as: Tylenol Take 2 tablets (650 mg total) by mouth every 6 (six) hours.   albuterol 108 (90 Base) MCG/ACT inhaler Commonly known as: ProAir HFA Inhale 1-2 puffs into the lungs every 6 (six) hours as needed for wheezing or shortness of breath.   azelastine 0.1 % nasal spray Commonly known as: ASTELIN 1-2 sprays per nostril 2 times daily as needed.   budesonide-formoterol 160-4.5 MCG/ACT inhaler Commonly known as: Symbicort Inhale 2 puffs into the lungs 2 (two) times daily.   cyclobenzaprine 5 MG tablet Commonly known as: FLEXERIL Take 1 tablet (5 mg total) by mouth 3 (three) times daily as needed for muscle spasms.   diclofenac 75 MG EC tablet Commonly known as: VOLTAREN Take 1 tablet (75 mg total) by mouth 2 (two) times daily.   ezetimibe 10 MG tablet Commonly known as: ZETIA Take 1 tablet (10 mg total) by mouth daily.   famotidine-calcium carbonate-magnesium hydroxide 10-800-165 MG chewable tablet Commonly known as: PEPCID COMPLETE Chew by mouth.   fenofibrate 145 MG tablet Commonly known as: TRICOR Take 1 tablet by mouth daily.   flecainide 150 MG tablet Commonly known as: TAMBOCOR Take 1 tablet (150 mg total) by mouth 2 (two) times daily.   fluticasone 50 MCG/ACT nasal spray Commonly known as: FLONASE Place 1 spray into both nostrils daily.   gabapentin 100 MG capsule Commonly known as: NEURONTIN Take 1 capsule (100 mg total) by mouth 3 (three) times daily.   ibuprofen 200 MG tablet Commonly known as: Advil Take 3 tablets (600 mg total) by mouth every 6 (six) hours.   levocetirizine 5 MG tablet Commonly known as: XYZAL TAKE 1 TABLET(5 MG) BY MOUTH EVERY EVENING   Magnesium 250 MG Tabs Take 1 tablet by mouth once a week.   meloxicam 15 MG tablet Commonly known  as: MOBIC Take 1 tablet (15 mg total) by mouth daily.   metFORMIN 500 MG tablet Commonly known as: GLUCOPHAGE Take 1 tablet (500 mg total) by mouth 2 (two) times daily with a meal.   methylPREDNISolone 4 MG Tbpk tablet Commonly known as: MEDROL DOSEPAK 6 day dose pack - take as directed   metoprolol tartrate 25 MG tablet Commonly known as: LOPRESSOR Take as directed   montelukast 10 MG tablet Commonly known as: Singulair Take 1 tablet once at night for coughing or wheezing.   pantoprazole 40 MG tablet Commonly known as: PROTONIX Take 1 tablet (40 mg total) by mouth 2 (two) times daily.   PRESCRIPTION MEDICATION Inhale into the lungs at bedtime. CPAP   sildenafil 20 MG tablet Commonly known as: REVATIO Take 1-2 tablets (20-40 mg total) by mouth as needed.   Vitamin  D (Ergocalciferol) 1.25 MG (50000 UNIT) Caps capsule Commonly known as: DRISDOL Take 1 capsule (50,000 Units total) by mouth every 7 (seven) days.   Xarelto 20 MG Tabs tablet Generic drug: rivaroxaban TAKE 1 TABLET(20 MG) BY MOUTH DAILY WITH SUPPER       Signature:  Coralyn Helling, MD [facility]/Critical Care Pager - [phone] 07/11/2020, 9:50 AM

## 2020-07-16 ENCOUNTER — Other Ambulatory Visit: Payer: Self-pay

## 2020-07-16 ENCOUNTER — Ambulatory Visit (HOSPITAL_BASED_OUTPATIENT_CLINIC_OR_DEPARTMENT_OTHER)
Admission: RE | Admit: 2020-07-16 | Discharge: 2020-07-16 | Disposition: A | Discharge status: Home / Self Care | Payer: Medicare Other | Source: Ambulatory Visit | Attending: Pulmonary Disease | Admitting: Pulmonary Disease

## 2020-07-16 DIAGNOSIS — R079 Chest pain, unspecified: Secondary | ICD-10-CM | POA: Diagnosis not present

## 2020-07-21 ENCOUNTER — Ambulatory Visit: Payer: Medicaid Other

## 2020-07-21 ENCOUNTER — Ambulatory Visit: Payer: Medicaid Other | Admitting: Pulmonary Disease

## 2020-07-22 ENCOUNTER — Telehealth: Payer: Self-pay | Admitting: Pulmonary Disease

## 2020-07-22 DIAGNOSIS — R0600 Dyspnea, unspecified: Secondary | ICD-10-CM

## 2020-07-22 DIAGNOSIS — R0609 Other forms of dyspnea: Secondary | ICD-10-CM

## 2020-07-22 NOTE — Telephone Encounter (Signed)
HRCT chest 07/16/20 >> aortic atherosclerosis, calcified atherosclerotic plaque of Lt main/LAD/LCX/RCA, small thin walled cyst in lateral segment of RML, scarring inferior segment of lingula, fatty liver, left renal cyst   Discussed with pt.  He had LHC 12/22/15 that showed non obstructive CAD.  He has progressive fatigue, dyspnea on exertion.  I am concerned these symptoms could be angina equivalent.  Will message cardiology to review and determine if he needs to have further assessment for coronary artery disease.

## 2020-07-30 NOTE — Telephone Encounter (Signed)
Called and went over Dr Evlyn Courier message and  recommendations with patient. Patient is agreeable to having a cardiopulmonary exercise test. Order placed per Dr Craige Cotta. Nothing further needed at this time.

## 2020-07-30 NOTE — Telephone Encounter (Signed)
Please let Richard Walker know I have communicated with [name] with cardiology and he is recommending a cardiopulmonary exercise test to further assess his shortness of breath.  If he is agreeable, then please order cardiopulmonary exercise test.

## 2020-07-30 NOTE — Addendum Note (Signed)
Addended by: Melonie Florida on: 07/30/2020 04:18 PM   Modules accepted: Orders

## 2020-07-31 ENCOUNTER — Telehealth: Payer: Self-pay | Admitting: Pulmonary Disease

## 2020-07-31 NOTE — Telephone Encounter (Signed)
Called and spoke with pt and spoke with him about concerns in regards to doing the cpx. Pt verbalized understanding. Nothing further needed.

## 2020-08-13 ENCOUNTER — Ambulatory Visit: Payer: Medicare Other

## 2020-09-02 ENCOUNTER — Other Ambulatory Visit (HOSPITAL_COMMUNITY): Payer: Self-pay | Admitting: *Deleted

## 2020-09-02 ENCOUNTER — Other Ambulatory Visit: Payer: Self-pay

## 2020-09-02 ENCOUNTER — Ambulatory Visit (HOSPITAL_COMMUNITY): Payer: Medicare Other | Attending: Pulmonary Disease

## 2020-09-02 DIAGNOSIS — R0609 Other forms of dyspnea: Secondary | ICD-10-CM | POA: Diagnosis not present

## 2020-09-02 DIAGNOSIS — R0600 Dyspnea, unspecified: Secondary | ICD-10-CM | POA: Diagnosis present

## 2020-09-15 ENCOUNTER — Ambulatory Visit (INDEPENDENT_AMBULATORY_CARE_PROVIDER_SITE_OTHER): Payer: Medicare Other | Admitting: Pulmonary Disease

## 2020-09-15 ENCOUNTER — Other Ambulatory Visit: Payer: Self-pay

## 2020-09-15 ENCOUNTER — Encounter: Payer: Self-pay | Admitting: Pulmonary Disease

## 2020-09-15 VITALS — BP 126/82 | HR 69 | Temp 97.5°F | Ht 69.0 in | Wt 265.8 lb

## 2020-09-15 DIAGNOSIS — J4550 Severe persistent asthma, uncomplicated: Secondary | ICD-10-CM

## 2020-09-15 DIAGNOSIS — R0600 Dyspnea, unspecified: Secondary | ICD-10-CM

## 2020-09-15 DIAGNOSIS — J3089 Other allergic rhinitis: Secondary | ICD-10-CM | POA: Diagnosis not present

## 2020-09-15 DIAGNOSIS — H1013 Acute atopic conjunctivitis, bilateral: Secondary | ICD-10-CM

## 2020-09-15 DIAGNOSIS — Z6841 Body Mass Index (BMI) 40.0 and over, adult: Secondary | ICD-10-CM

## 2020-09-15 DIAGNOSIS — R0609 Other forms of dyspnea: Secondary | ICD-10-CM

## 2020-09-15 MED ORDER — BUDESONIDE-FORMOTEROL FUMARATE 160-4.5 MCG/ACT IN AERO: 2.0000 | INHALATION_SPRAY | Freq: Two times a day (BID) | RESPIRATORY_TRACT | 5 refills | Status: DC

## 2020-09-15 MED ORDER — AZELASTINE HCL 0.1 % NA SOLN: NASAL | 5 refills | Status: DC

## 2020-09-15 MED ORDER — FLUTICASONE PROPIONATE 50 MCG/ACT NA SUSP: 1.0000 | Freq: Every day | NASAL | 5 refills | Status: DC

## 2020-09-15 MED ORDER — ALBUTEROL SULFATE HFA 108 (90 BASE) MCG/ACT IN AERS
1.0000 | INHALATION_SPRAY | Freq: Four times a day (QID) | RESPIRATORY_TRACT | 5 refills | Status: DC | PRN
Start: 2020-09-15 — End: 2021-04-06

## 2020-09-15 MED ORDER — OLOPATADINE HCL 0.2 % OP SOLN: 1.0000 [drp] | Freq: Two times a day (BID) | OPHTHALMIC | 12 refills | Status: DC | PRN

## 2020-09-15 NOTE — Patient Instructions (Signed)
Olopatadine 1 drop in each eye twice per day as needed for allergies  Follow up in 6 months

## 2020-09-15 NOTE — Progress Notes (Signed)
[facility]  Chief Complaint  Patient presents with  . Follow-up    Shortness of breath with activity    Constitutional:  BP 126/82 (BP Location: Left Arm, Cuff Size: Normal)   Pulse 69   Temp (!) 97.5 F (36.4 C) (Other (Comment)) Comment (Src): wrist  Ht 5\' 9"  (1.753 m)   Wt 265 lb 12.8 oz (120.6 kg)   SpO2 98% Comment: Room air  BMI 39.25 kg/m   Past Medical History:  SVT, Atrial fibrillation/flutter, OSA  Past Surgical History:  His  has a past surgical history that includes Back surgery; Wisdom tooth extraction; Cardiac catheterization (N/A, 12/22/2015); Cardiac catheterization (N/A, 02/19/2016); Cardiac electrophysiology study and ablation; septal reconstruction (2017); Turbinate reduction (Bilateral, 07/2016); and Amputation (Left, 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  Brief Summary:  Richard Walker is a 65 y.o. male former smoker with dyspnea and cough.       Subjective:   He had CPST.  More consistent with deconditioning and submaximal effort.  Has eye irritation and tearing intermittently.  No having cough, wheeze, or sputum.  No issues with inhalers.  Intermittently gets palpitations.    Has been trying to work on his weight.  Physical Exam:   Appearance - well kempt   ENMT - no sinus tenderness, no oral exudate, no LAN, Mallampati 3 airway, no stridor  Respiratory - equal breath sounds bilaterally, no wheezing or rales  CV - s1s2 regular rate and rhythm, no murmurs  Ext - no clubbing, no edema  Skin - no rashes  Psych - normal mood and affect    Pulmonary testing:   PFT 10/31/17 >> FEV1 2.29 64%), FEV1% 83, TLC 5.59 (79%), DLCO 77%  IgE 05/28/20 >> 151  PFT 06/24/20 >> FEV1 2.86 (82%), FEV1% 83, TLC 3.98 (56%), DLCO 99%, +BD  Chest Imaging:   HRCT chest 10/21/17 >> atherosclerosis, scarring RML and lingula, hepatic steatosis  HRCT chest 07/16/20 >> aortic atherosclerosis, calcified atherosclerotic plaque of Lt  main/LAD/LCX/RCA, small thin walled cyst in lateral segment of RML, scarring inferior segment of lingula, fatty liver, left renal cyst   Sleep Tests:   PSG 12/29/15 >> AHI 69.8, SpO2 low 89%  ONO with CPAP 05/31/18 >> test time 8 hrs 54 min.  Baseline SpO2 95.9%, low SpO2 93%.  Cardiac Tests:   [facility] 12/22/15 >> mild, non obstructive CAD, normal LV fx  CPST 05/24/18 >> submaximal effort, mild chronotropic incompetence  Echo 10/31/18 >> EF 55 to 60%  CPST 09/02/20 >> submaximal effort, deconditioning, mild restriction from body habitus, no clear cardiopulmonary limitation  Social History:  He  reports that he quit smoking about 30 years ago. His smoking use included cigarettes. He has a 10.00 pack-year smoking history. He has never used smokeless tobacco. He reports that he does not drink alcohol and does not use drugs.  Family History:  His family history includes Food Allergy in his son; Hypertension in his father.    Discussion:  He has multifactorial dyspnea: allergic asthma, obesity with deconditioning, a fib/flutter with diastolic CHF.  Based on recent cardiopulmonary exercise test it seems like deconditioning is main issue at this time.  Assessment/Plan:   Restrictive lung disease. - related to obesity and body habitus  Moderate persistent asthma with allergic profile. - eosinophil level okay - elevated IgE - continue symbicort, singulair and prn albuterol  Perennial and seasonal allergic rhinitis. - previously seen by Dr. 10/31/20 with Asthma/Allergy (last in March 2019) - reaction to grass  pollen, tree pollen, cockroach antigen, and dust mite antigen - continue astelin, flonase, singulair, and xyzal  Allergic conjunctivitis. - prn olopatadine 1 drop bid prn  Obstructive sleep apnea. - managed by Dr. Vickey Huger with [facility]  Atrial fibrillation/flutter. - followed by Dr. Lewayne Bunting with [facility]  Time Spent Involved in Patient Care on Day of  Examination:  34 minutes  Follow up:  Patient Instructions  Olopatadine 1 drop in each eye twice per day as needed for allergies  Follow up in 6 months   Medication List:   Allergies as of 09/15/2020      Reactions   Tree Extract    Pork-derived Products Swelling   Statins    Muscle aches      Medication List       Accurate as of September 15, 2020  2:37 PM. If you have any questions, ask your nurse or doctor.        STOP taking these medications   methylPREDNISolone 4 MG Tbpk tablet Commonly known as: MEDROL DOSEPAK Stopped by: Coralyn Helling, MD     TAKE these medications   acetaminophen 325 MG tablet Commonly known as: Tylenol Take 2 tablets (650 mg total) by mouth every 6 (six) hours.   albuterol 108 (90 Base) MCG/ACT inhaler Commonly known as: ProAir HFA Inhale 1-2 puffs into the lungs every 6 (six) hours as needed for wheezing or shortness of breath.   azelastine 0.1 % nasal spray Commonly known as: ASTELIN 1-2 sprays per nostril 2 times daily as needed.   budesonide-formoterol 160-4.5 MCG/ACT inhaler Commonly known as: Symbicort Inhale 2 puffs into the lungs 2 (two) times daily.   cyclobenzaprine 5 MG tablet Commonly known as: FLEXERIL Take 1 tablet (5 mg total) by mouth 3 (three) times daily as needed for muscle spasms.   diclofenac 75 MG EC tablet Commonly known as: VOLTAREN Take 1 tablet (75 mg total) by mouth 2 (two) times daily.   ezetimibe 10 MG tablet Commonly known as: ZETIA Take 1 tablet (10 mg total) by mouth daily.   famotidine-calcium carbonate-magnesium hydroxide 10-800-165 MG chewable tablet Commonly known as: PEPCID COMPLETE Chew by mouth.   fenofibrate 145 MG tablet Commonly known as: TRICOR Take 1 tablet by mouth daily.   flecainide 150 MG tablet Commonly known as: TAMBOCOR Take 1 tablet (150 mg total) by mouth 2 (two) times daily.   fluticasone 50 MCG/ACT nasal spray Commonly known as: FLONASE Place 1 spray into both  nostrils daily.   gabapentin 100 MG capsule Commonly known as: NEURONTIN Take 1 capsule (100 mg total) by mouth 3 (three) times daily.   ibuprofen 200 MG tablet Commonly known as: Advil Take 3 tablets (600 mg total) by mouth every 6 (six) hours.   levocetirizine 5 MG tablet Commonly known as: XYZAL TAKE 1 TABLET(5 MG) BY MOUTH EVERY EVENING   Magnesium 250 MG Tabs Take 1 tablet by mouth once a week.   meloxicam 15 MG tablet Commonly known as: MOBIC Take 1 tablet (15 mg total) by mouth daily.   metFORMIN 500 MG tablet Commonly known as: GLUCOPHAGE Take 1 tablet (500 mg total) by mouth 2 (two) times daily with a meal.   metoprolol tartrate 25 MG tablet Commonly known as: LOPRESSOR Take as directed   montelukast 10 MG tablet Commonly known as: Singulair Take 1 tablet once at night for coughing or wheezing.   Olopatadine HCl 0.2 % Soln Apply 1 drop to eye 2 (two) times daily as  needed (allergies). Started by: Coralyn Helling, MD   pantoprazole 40 MG tablet Commonly known as: PROTONIX Take 1 tablet (40 mg total) by mouth 2 (two) times daily.   PRESCRIPTION MEDICATION Inhale into the lungs at bedtime. CPAP   sildenafil 20 MG tablet Commonly known as: REVATIO Take 1-2 tablets (20-40 mg total) by mouth as needed.   Vitamin D (Ergocalciferol) 1.25 MG (50000 UNIT) Caps capsule Commonly known as: DRISDOL Take 1 capsule (50,000 Units total) by mouth every 7 (seven) days.   Xarelto 20 MG Tabs tablet Generic drug: rivaroxaban TAKE 1 TABLET(20 MG) BY MOUTH DAILY WITH SUPPER       Signature:  Coralyn Helling, MD [facility] Pulmonary/Critical Care Pager - [phone] 09/15/2020, 2:37 PM

## 2020-09-25 ENCOUNTER — Other Ambulatory Visit: Payer: Self-pay | Admitting: Pulmonary Disease

## 2020-09-25 MED ORDER — OLOPATADINE HCL 0.2 % OP SOLN: 1.0000 [drp] | Freq: Every day | OPHTHALMIC | 3 refills | Status: DC | PRN

## 2020-10-07 ENCOUNTER — Emergency Department (HOSPITAL_BASED_OUTPATIENT_CLINIC_OR_DEPARTMENT_OTHER): Payer: Medicare Other

## 2020-10-07 ENCOUNTER — Emergency Department (HOSPITAL_BASED_OUTPATIENT_CLINIC_OR_DEPARTMENT_OTHER)
Admission: EM | Admit: 2020-10-07 | Discharge: 2020-10-07 | Disposition: A | Discharge status: Home / Self Care | Payer: Medicare Other | Attending: Emergency Medicine | Admitting: Emergency Medicine

## 2020-10-07 ENCOUNTER — Other Ambulatory Visit: Payer: Self-pay

## 2020-10-07 ENCOUNTER — Encounter (HOSPITAL_BASED_OUTPATIENT_CLINIC_OR_DEPARTMENT_OTHER): Payer: Self-pay

## 2020-10-07 DIAGNOSIS — Z89022 Acquired absence of left finger(s): Secondary | ICD-10-CM | POA: Diagnosis not present

## 2020-10-07 DIAGNOSIS — R519 Headache, unspecified: Secondary | ICD-10-CM | POA: Diagnosis not present

## 2020-10-07 DIAGNOSIS — Z87891 Personal history of nicotine dependence: Secondary | ICD-10-CM | POA: Diagnosis not present

## 2020-10-07 DIAGNOSIS — M25561 Pain in right knee: Secondary | ICD-10-CM | POA: Insufficient documentation

## 2020-10-07 DIAGNOSIS — Y92410 Unspecified street and highway as the place of occurrence of the external cause: Secondary | ICD-10-CM | POA: Diagnosis not present

## 2020-10-07 DIAGNOSIS — M545 Low back pain, unspecified: Secondary | ICD-10-CM

## 2020-10-07 DIAGNOSIS — Z7901 Long term (current) use of anticoagulants: Secondary | ICD-10-CM | POA: Diagnosis not present

## 2020-10-07 DIAGNOSIS — M542 Cervicalgia: Secondary | ICD-10-CM | POA: Insufficient documentation

## 2020-10-07 DIAGNOSIS — M25562 Pain in left knee: Secondary | ICD-10-CM | POA: Insufficient documentation

## 2020-10-07 MED ORDER — CYCLOBENZAPRINE HCL 10 MG PO TABS: 10.0000 mg | ORAL_TABLET | Freq: Two times a day (BID) | ORAL | 0 refills | Status: DC | PRN
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
## 2020-10-23 ENCOUNTER — Other Ambulatory Visit: Payer: Self-pay

## 2020-10-23 ENCOUNTER — Ambulatory Visit (INDEPENDENT_AMBULATORY_CARE_PROVIDER_SITE_OTHER): Payer: Self-pay | Admitting: Family Medicine

## 2020-10-23 ENCOUNTER — Ambulatory Visit: Payer: Self-pay

## 2020-10-23 ENCOUNTER — Encounter: Payer: Self-pay | Admitting: Family Medicine

## 2020-10-23 VITALS — BP 152/86 | Ht 69.0 in | Wt 265.0 lb

## 2020-10-23 DIAGNOSIS — M1712 Unilateral primary osteoarthritis, left knee: Secondary | ICD-10-CM | POA: Insufficient documentation

## 2020-10-23 DIAGNOSIS — T148XXA Other injury of unspecified body region, initial encounter: Secondary | ICD-10-CM | POA: Insufficient documentation

## 2020-10-23 DIAGNOSIS — M23204 Derangement of unspecified medial meniscus due to old tear or injury, left knee: Secondary | ICD-10-CM

## 2020-10-23 DIAGNOSIS — S060X0A Concussion without loss of consciousness, initial encounter: Secondary | ICD-10-CM | POA: Insufficient documentation

## 2020-10-23 DIAGNOSIS — M25562 Pain in left knee: Secondary | ICD-10-CM

## 2020-10-23 DIAGNOSIS — M170 Bilateral primary osteoarthritis of knee: Secondary | ICD-10-CM | POA: Insufficient documentation

## 2020-10-23 DIAGNOSIS — M25561 Pain in right knee: Secondary | ICD-10-CM

## 2020-10-23 NOTE — Progress Notes (Signed)
Medication Samples have been provided to the patient.  Drug name: Pennsaid     Strength: 2%       Qty: 2 boxes  PCH:[id number]  Exp.Date:12/2020  Dosing instructions: Use pea sized amount on affected area twice daily.   The patient has been instructed regarding the correct time, dose, and frequency of taking this medication, including desired effects and most common side effects.   Lanier Prude 3:43 PM 10/23/2020

## 2020-10-23 NOTE — Patient Instructions (Signed)
Nice to meet you Please try ice on the knees  Please try the rub on medicine  Please try the exercises  Please try physical therapy  Please try the brace  Please send me a message in MyChart with any questions or updates.  Please see me back in 3 weeks.   --Dr. Jordan Likes
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
## 2020-10-23 NOTE — Assessment & Plan Note (Signed)
Symptoms seem more consistent with irritation of the degenerative meniscus. -Counseled on home exercise therapy and supportive care. -Referral to physical therapy. -Pennsaid samples. -Could consider injection.

## 2020-10-23 NOTE — Assessment & Plan Note (Signed)
Has stress changes appreciated of the right tibial plateau.  No significant effusion in the right knee -Counseled on home exercise therapy and supportive care. -Pennsaid sample. -Hinged knee brace. -Referral to physical therapy. -Could consider further imaging if needed
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
## 2020-11-03 ENCOUNTER — Ambulatory Visit (INDEPENDENT_AMBULATORY_CARE_PROVIDER_SITE_OTHER): Payer: Medicare Other | Admitting: Internal Medicine

## 2020-11-03 ENCOUNTER — Other Ambulatory Visit: Payer: Self-pay

## 2020-11-03 ENCOUNTER — Encounter: Payer: Self-pay | Admitting: Internal Medicine

## 2020-11-03 VITALS — BP 132/84 | HR 77 | Ht 69.0 in | Wt 265.2 lb

## 2020-11-03 DIAGNOSIS — I4892 Unspecified atrial flutter: Secondary | ICD-10-CM | POA: Diagnosis not present

## 2020-11-03 DIAGNOSIS — I10 Essential (primary) hypertension: Secondary | ICD-10-CM | POA: Diagnosis not present

## 2020-11-03 DIAGNOSIS — I480 Paroxysmal atrial fibrillation: Secondary | ICD-10-CM

## 2020-11-03 MED ORDER — FLECAINIDE ACETATE 150 MG PO TABS: 150.0000 mg | ORAL_TABLET | Freq: Two times a day (BID) | ORAL | 3 refills | Status: DC

## 2020-11-03 MED ORDER — RIVAROXABAN 20 MG PO TABS: 20.0000 mg | ORAL_TABLET | Freq: Every day | ORAL | 3 refills | Status: DC

## 2020-11-03 MED ORDER — METOPROLOL TARTRATE 25 MG PO TABS: ORAL_TABLET | ORAL | 3 refills | Status: DC
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
## 2020-11-03 NOTE — Patient Instructions (Signed)

## 2020-11-05 ENCOUNTER — Encounter: Payer: Self-pay | Admitting: Physical Therapy

## 2020-11-05 ENCOUNTER — Ambulatory Visit: Payer: Medicare Other | Attending: Family Medicine | Admitting: Physical Therapy

## 2020-11-05 ENCOUNTER — Other Ambulatory Visit: Payer: Self-pay

## 2020-11-05 DIAGNOSIS — M6281 Muscle weakness (generalized): Secondary | ICD-10-CM | POA: Insufficient documentation

## 2020-11-05 DIAGNOSIS — M25561 Pain in right knee: Secondary | ICD-10-CM | POA: Diagnosis present

## 2020-11-05 DIAGNOSIS — G8929 Other chronic pain: Secondary | ICD-10-CM | POA: Insufficient documentation

## 2020-11-05 DIAGNOSIS — M5412 Radiculopathy, cervical region: Secondary | ICD-10-CM | POA: Insufficient documentation

## 2020-11-05 DIAGNOSIS — M25562 Pain in left knee: Secondary | ICD-10-CM | POA: Insufficient documentation

## 2020-11-05 DIAGNOSIS — M545 Low back pain, unspecified: Secondary | ICD-10-CM | POA: Diagnosis present

## 2020-11-05 DIAGNOSIS — R262 Difficulty in walking, not elsewhere classified: Secondary | ICD-10-CM | POA: Diagnosis present
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
## 2020-11-10 ENCOUNTER — Other Ambulatory Visit: Payer: Self-pay

## 2020-11-10 ENCOUNTER — Ambulatory Visit: Payer: Medicare Other

## 2020-11-10 DIAGNOSIS — R262 Difficulty in walking, not elsewhere classified: Secondary | ICD-10-CM

## 2020-11-10 DIAGNOSIS — M5412 Radiculopathy, cervical region: Secondary | ICD-10-CM

## 2020-11-10 DIAGNOSIS — M6281 Muscle weakness (generalized): Secondary | ICD-10-CM

## 2020-11-10 DIAGNOSIS — M25561 Pain in right knee: Secondary | ICD-10-CM

## 2020-11-10 DIAGNOSIS — M545 Low back pain, unspecified: Secondary | ICD-10-CM

## 2020-11-10 DIAGNOSIS — G8929 Other chronic pain: Secondary | ICD-10-CM

## 2020-11-10 DIAGNOSIS — M25562 Pain in left knee: Secondary | ICD-10-CM

## 2020-11-10 NOTE — Therapy (Signed)
[facility] [address]  [address] [place], [place], [address] Phone: [phone]   Fax:  [phone]  Physical Therapy Treatment  Patient Details  Name: Richard Walker MRN: [id number] Date of Birth: [birth year] Referring Provider (PT): Clare Gandy, MD   Encounter Date: 11/10/2020   PT End of Session - 11/10/20 1526    Visit Number 2    Number of Visits 17    Date for PT Re-Evaluation 12/31/20    Authorization Type Medicare & Traditional Medicaid    PT Start Time 1449    PT Stop Time 1527    PT Time Calculation (min) 38 min    Activity Tolerance Patient tolerated treatment well;Patient limited by pain    Behavior During Therapy [facility] for tasks assessed/performed           Past Medical History:  Diagnosis Date  . DDD (degenerative disc disease), lumbar    L3/L4  . Diverticulosis   . GERD (gastroesophageal reflux disease)   . Hyperlipidemia   . Low sperm motility   . Pre-diabetes   . Sleep apnea    uses cpap   . SVT (supraventricular tachycardia) (HCC)     Past Surgical History:  Procedure Laterality Date  . 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 REPAIR OF LEFT INDEX AND LONG FINGER BY FUSION /  REPAIR OF RING FINGER;  Surgeon: Mack Hook, MD;  Location: [facility] OR;  Service: Orthopedics;  Laterality: Left;  . BACK SURGERY    . CARDIAC CATHETERIZATION N/A 12/22/2015   Procedure: Left Heart Cath and Coronary Angiography;  Surgeon: Peter M Swaziland, MD;  Location: [facility] INVASIVE CV LAB;  Service: Cardiovascular;  Laterality: N/A;  . CARDIAC ELECTROPHYSIOLOGY STUDY AND ABLATION    . ELECTROPHYSIOLOGIC STUDY N/A 02/19/2016   Procedure: SVT Ablation;  Surgeon: Marinus Maw, MD;  Location: [facility] INVASIVE CV LAB;  Service: Cardiovascular;  Laterality: N/A;  . septal reconstruction  2017  . TURBINATE REDUCTION Bilateral 07/2016  . WISDOM TOOTH EXTRACTION      There were no vitals filed for this visit.   Subjective  Assessment - 11/10/20 1453    Subjective Pt reports the exercises have been helping and he has no new complaints.    Pertinent History none    Diagnostic tests Per MD- "Independent review of the CT lumbar spine from 2/15 shows stable interbody fusion at L4/5 and degenerative disc disease at L5-S1.    Independent review of the CT cervical spine 2/15 shows severe degenerative change at C5-6.    Independent review the B knee x-ray from 2/15 shows no acute changes."    Patient Stated Goals decrease pain and dizziness    Currently in Pain? Yes    Pain Score 6     Pain Location Neck    Pain Orientation Left    Pain Descriptors / Indicators Sharp    Pain Type Acute pain    Pain Score 8    Pain Location Knee    Pain Orientation Right;Left;Anterior    Pain Descriptors / Indicators Sharp    Pain Type Acute pain                             [facility] Adult PT Treatment/Exercise - 11/10/20 0001      Exercises   Exercises Neck;Shoulder;Lumbar;Knee/Hip      Neck Exercises: Seated   Neck Retraction 10 reps;5 secs  Lumbar Exercises: Seated   Long Arc Quad on Chair AAROM;Both;1 set;10 reps      Shoulder Exercises: Standing   Extension Strengthening;Both;20 reps;Theraband    Theraband Level (Shoulder Extension) Level 2 (Red)    Row Strengthening;Both;20 reps;Theraband    Theraband Level (Shoulder Row) Level 2 (Red)      Shoulder Exercises: ROM/Strengthening   Other ROM/Strengthening Exercises I, Y, T  10 reps each back against wall for scap retract                    PT Short Term Goals - 11/05/20 1716      PT SHORT TERM GOAL #1   Title Patient to be independent with initial HEP.    Time 3    Period Weeks    Status New    Target Date 11/26/20             PT Long Term Goals - 11/05/20 1716      PT LONG TERM GOAL #1   Title Patient to be independent with advanced HEP.    Time 8    Period Weeks    Status New    Target Date 12/31/20      PT LONG  TERM GOAL #2   Title Patient to demonstrate L UE strength >/=4+/5.    Time 8    Period Weeks    Status New    Target Date 12/31/20      PT LONG TERM GOAL #3   Title Patient to demonstrate B LE strength >/=4+/5.    Time 8    Period Weeks    Status New    Target Date 12/31/20      PT LONG TERM GOAL #4   Title Patient to demonstrate cervical AROM WFL and without pain limiting.    Time 8    Period Weeks    Status New    Target Date 12/31/20      PT LONG TERM GOAL #5   Title Patient to report tolerance for kneeling to pray d/t improvement in pain levels.    Time 8    Period Weeks    Status New    Target Date 12/31/20      Additional Long Term Goals   Additional Long Term Goals Yes      PT LONG TERM GOAL #6   Title Patient to report tolerance for 2 hours on his feet without pain limiting.    Time 8    Period Weeks    Status New    Target Date 12/31/20      PT LONG TERM GOAL #7   Title Patient to report 90% improvement in dizziness.    Time 8    Period Weeks    Status New    Target Date 12/31/20                 Plan - 11/10/20 1527    Clinical Impression Statement Pt mostly was limited by pain when attempting the side bend and levator stretches, he noted a sharp pain in the neck trying those so discontinued them. Pt had a good response to the exercises, no reports of pain with any other exercises. Palpated a lot of tightness in the UT with reports of sharp pain when palpating, also a lot of tightness in the periscap muscles w/o sharp pain. Did not review HEP with pt, d/t him verbalizing understanding and declining the need to review exercises. Did some posutral exercises,  educated him on keeping good posture with scap retracted and keeping chin tuck.    Personal Factors and Comorbidities Age;Comorbidity 1;Fitness;Past/Current Experience;Time since onset of injury/illness/exacerbation    Comorbidities back surgery L3-4 1997    Examination-Activity Limitations  Bathing;Sit;Sleep;Bed Mobility;Bend;Squat;Stairs;Caring for Others;Carry;Stand;Toileting;Dressing;Transfers;Hygiene/Grooming;Lift;Locomotion Level;Reach Overhead    PT Frequency 2x / week    PT Duration 8 weeks    PT Treatment/Interventions ADLs/Self Care Home Management;Canalith Repostioning;Cryotherapy;Electrical Stimulation;Iontophoresis 4mg /ml Dexamethasone;Moist Heat;Traction;Balance training;Therapeutic exercise;Therapeutic activities;Functional mobility training;Stair training;Gait training;Ultrasound;Neuromuscular re-education;Patient/family education;Manual techniques;Vestibular;Vasopneumatic Device;Taping;Energy conservation;Dry needling;Passive range of motion;Scar mobilization    PT Next Visit Plan reassess HEP, palpate neck & shoulders, knees, LB; assess knee & lumbar AROM, LE MMT, vestibular assessment    Consulted and Agree with Plan of Care Patient           Patient will benefit from skilled therapeutic intervention in order to improve the following deficits and impairments:  Abnormal gait,Hypomobility,Decreased activity tolerance,Decreased strength,Increased fascial restricitons,Impaired UE functional use,Pain,Difficulty walking,Decreased balance,Increased muscle spasms,Improper body mechanics,Dizziness,Decreased range of motion,Impaired flexibility,Postural dysfunction  Visit Diagnosis: Radiculopathy, cervical region  Muscle weakness (generalized)  Chronic pain of left knee  Acute pain of right knee  Bilateral low back pain, unspecified chronicity, unspecified whether sciatica present  Difficulty in walking, not elsewhere classified     Problem List Patient Active Problem List   Diagnosis Date Noted  . Hypertension 11/03/2020  . Contusion of bone 10/23/2020  . Degenerative tear of left medial meniscus 10/23/2020  . Concussion with no loss of consciousness 10/23/2020  . Paroxysmal atrial fibrillation (HCC) 05/22/2019  . Unspecified atrial flutter (HCC) 05/22/2019   . Pain in right foot 03/30/2018  . Perennial and seasonal allergic rhinitis 11/09/2017  . History of food allergy 11/09/2017  . Wheezing 11/09/2017  . Dyspnea/wheezing 10/31/2017  . Cough 10/31/2017  . Bronchitis, mucopurulent recurrent (HCC) 09/07/2017  . SVT (supraventricular tachycardia) (HCC) 04/23/2015  . Abdominal pain, epigastric 06/03/2013  . Unspecified vitamin D deficiency 04/09/2013  . Esophageal reflux 04/09/2013  . Hepatitis B carrier (HCC) 04/09/2013  . Other and unspecified hyperlipidemia 04/09/2013  . Unspecified sleep apnea 04/09/2013    04/11/2013, PTA 11/10/2020, 4:11 PM  [facility] [address]  [address] [place], [place], [place] Phone: [phone]   Fax:  [phone]  Name: LUCION DILGER MRN: Parthenia Ames Date of Birth: [birth year]

## 2020-11-11 ENCOUNTER — Ambulatory Visit (INDEPENDENT_AMBULATORY_CARE_PROVIDER_SITE_OTHER): Payer: Medicare Other | Admitting: Medical

## 2020-11-11 VITALS — BP 137/82 | HR 73 | Temp 98.0°F | Resp 20 | Ht 69.0 in | Wt 262.0 lb

## 2020-11-11 DIAGNOSIS — J411 Mucopurulent chronic bronchitis: Secondary | ICD-10-CM | POA: Diagnosis not present

## 2020-11-11 DIAGNOSIS — R739 Hyperglycemia, unspecified: Secondary | ICD-10-CM

## 2020-11-11 DIAGNOSIS — K219 Gastro-esophageal reflux disease without esophagitis: Secondary | ICD-10-CM | POA: Diagnosis not present

## 2020-11-11 DIAGNOSIS — I10 Essential (primary) hypertension: Secondary | ICD-10-CM | POA: Diagnosis not present

## 2020-11-11 DIAGNOSIS — E785 Hyperlipidemia, unspecified: Secondary | ICD-10-CM

## 2020-11-11 DIAGNOSIS — R059 Cough, unspecified: Secondary | ICD-10-CM

## 2020-11-11 DIAGNOSIS — J3089 Other allergic rhinitis: Secondary | ICD-10-CM

## 2020-11-11 DIAGNOSIS — I480 Paroxysmal atrial fibrillation: Secondary | ICD-10-CM

## 2020-11-11 MED ORDER — BENZONATATE 100 MG PO CAPS: 100.0000 mg | ORAL_CAPSULE | Freq: Three times a day (TID) | ORAL | 0 refills | Status: DC | PRN

## 2020-11-11 MED ORDER — LEVOCETIRIZINE DIHYDROCHLORIDE 5 MG PO TABS: ORAL_TABLET | ORAL | 11 refills | Status: DC

## 2020-11-11 NOTE — Progress Notes (Signed)
Subjective:    Patient ID: Richard Walker, male    DOB: 12/21/1955, 65 y.o.   MRN: [id number]  HPI  Pt in for follow up.  Pt has htn, paroxyxaml atrial fibrillation, hyperlipidemia, gerd,  fatigue,  ED and elevated sugar.    Pt is on flecanide, xarelto  and metoprolol.   Pt has no cardiac or neurologic signs or symptoms.  Pt has had recent nasal congested, runny nose, hoarse voice and chest congestion. Pt famly all sick with upper respiratory type signs and symptoms. Pt nose is runny nose with pnd. Pt went to [facility] and  rx'd azithromycin. Pt had rapid and send out covid test. Both were negative. Pt has flonase and astelin. Pt thinks flonase make his heart race. Pt used to be on xyzal and montelukast.   Pt has no obvious wheezing or sob. He is on symbicort. Pt sees Dr. Craige Cotta pulmonologist.  Former smoker. Pt has moderate asthma and seasonal allergies.   Review of Systems  Constitutional: Negative for chills and fatigue.  Respiratory: Positive for cough. Negative for chest tightness, shortness of breath and wheezing.   Cardiovascular: Negative for chest pain and palpitations.  Gastrointestinal: Negative for abdominal pain, blood in stool, nausea and vomiting.  Genitourinary: Negative for dysuria, enuresis, flank pain, frequency and testicular pain.  Musculoskeletal: Negative for back pain.  Skin: Negative for rash.  Neurological: Negative for dizziness, speech difficulty, weakness, light-headedness and headaches.  Hematological: Negative for adenopathy. Does not bruise/bleed easily.  Psychiatric/Behavioral: Negative for behavioral problems, confusion, decreased concentration and suicidal ideas. The patient is not nervous/anxious.     Past Medical History:  Diagnosis Date  . DDD (degenerative disc disease), lumbar    L3/L4  . Diverticulosis   . GERD (gastroesophageal reflux disease)   . Hyperlipidemia   . Low sperm motility   . Pre-diabetes   . Sleep apnea    uses cpap   .  SVT (supraventricular tachycardia) (HCC)      Social History   Socioeconomic History  . Marital status: Married    Spouse name: Not on file  . Number of children: Not on file  . Years of education: Not on file  . Highest education level: Not on file  Occupational History  . Not on file  Tobacco Use  . Smoking status: Former Smoker    Packs/day: 1.00    Years: 10.00    Pack years: 10.00    Types: Cigarettes    Quit date: 1992    Years since quitting: 30.2  . Smokeless tobacco: Never Used  . Tobacco comment: Uses Hookah Pipe occasionally  Vaping Use  . Vaping Use: Never used  Substance and Sexual Activity  . Alcohol use: No  . Drug use: No  . Sexual activity: Yes  Other Topics Concern  . Not on file  Social History Narrative   Marital Status: Married Engineer, drilling)   Children:  4 (3 Sons/1 Daughter)   Pets:  None    Living Situation: Lives with spouse and 4 children   Origin:  He was born in [place].   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     Alcohol Use:  None   Drug Use:  None   Diet:  Regular   Exercise:  None   Hobbies:  Cards   Social Determinants of Health   Financial Resource Strain: Not on file  Food Insecurity: Not on file  Transportation Needs: Not on file  Physical Activity: Not on file  Stress: Not on file  Social Connections: Not on file  Intimate Partner Violence: Not on file    Past Surgical History:  Procedure Laterality Date  . 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 REPAIR OF LEFT INDEX AND LONG FINGER BY FUSION /  REPAIR OF RING FINGER;  Surgeon: Mack Hook, MD;  Location: [facility] OR;  Service: Orthopedics;  Laterality: Left;  . BACK SURGERY    . CARDIAC CATHETERIZATION N/A 12/22/2015   Procedure: Left Heart Cath and Coronary Angiography;  Surgeon: Peter M Swaziland, MD;  Location: [facility] INVASIVE CV LAB;  Service:  Cardiovascular;  Laterality: N/A;  . CARDIAC ELECTROPHYSIOLOGY STUDY AND ABLATION    . ELECTROPHYSIOLOGIC STUDY N/A 02/19/2016   Procedure: SVT Ablation;  Surgeon: Marinus Maw, MD;  Location[facility] INVASIVE CV LAB;  Service: Cardiovascular;  Laterality: N/A;  . septal reconstruction  2017  . TURBINATE REDUCTION Bilateral 07/2016  . WISDOM TOOTH EXTRACTION      Family History  Problem Relation Age of Onset  . Hypertension Father   . Food Allergy Son   . Colon cancer Neg Hx   . Stomach cancer Neg Hx   . Allergic rhinitis Neg Hx   . Angioedema Neg Hx   . Asthma Neg Hx   . Eczema Neg Hx   . Immunodeficiency Neg Hx   . Urticaria Neg Hx     Allergies  Allergen Reactions  . Tree Extract   . Pork-Derived Products Swelling  . Statins     Muscle aches    Current Outpatient Medications on File Prior to Visit  Medication Sig Dispense Refill  . acetaminophen (TYLENOL) 325 MG tablet Take 2 tablets (650 mg total) by mouth every 6 (six) hours.    [name] albuterol (PROAIR HFA) 108 (90 Base) MCG/ACT inhaler Inhale 1-2 puffs into the lungs every 6 (six) hours as needed for wheezing or shortness of breath. 8 g 5  . azelastine (ASTELIN) 0.1 % nasal spray 1-2 sprays per nostril 2 times daily as needed. 30 mL 5  . budesonide-formoterol (SYMBICORT) 160-4.5 MCG/ACT inhaler Inhale 2 puffs into the lungs 2 (two) times daily. 10.2 g 5  . cyclobenzaprine (FLEXERIL) 10 MG tablet Take 1 tablet (10 mg total) by mouth 2 (two) times daily as needed for muscle spasms. 20 tablet 0  . cyclobenzaprine (FLEXERIL) 5 MG tablet Take 1 tablet (5 mg total) by mouth 3 (three) times daily as needed for muscle spasms. 30 tablet 1  . diclofenac (VOLTAREN) 75 MG EC tablet Take 1 tablet (75 mg total) by mouth 2 (two) times daily. 90 tablet 2  . ezetimibe (ZETIA) 10 MG tablet Take 1 tablet (10 mg total) by mouth daily. 30 tablet 11  . famotidine-calcium carbonate-magnesium hydroxide (PEPCID COMPLETE) 10-800-165 MG chewable tablet  Chew by mouth.    . fenofibrate (TRICOR) 145 MG tablet Take 1 tablet by mouth daily.    . flecainide (TAMBOCOR) 150 MG tablet Take 1 tablet (150 mg total) by mouth 2 (two) times daily. 180 tablet 3  . fluticasone (FLONASE) 50 MCG/ACT nasal spray Place 1 spray into both nostrils daily. 16 g 5  . gabapentin (NEURONTIN) 100 MG capsule Take 1 capsule (100 mg total) by mouth 3 (three) times daily. 90 capsule 1  . ibuprofen (ADVIL) 200 MG tablet Take 3 tablets (600 mg total) by mouth every 6 (six) hours.    [name]  levocetirizine (XYZAL) 5 MG tablet TAKE 1 TABLET(5 MG) BY MOUTH EVERY EVENING 90 tablet 0  . Magnesium 250 MG TABS Take 1 tablet by mouth once a week.    . meloxicam (MOBIC) 15 MG tablet Take 1 tablet (15 mg total) by mouth daily. 30 tablet 1  . metFORMIN (GLUCOPHAGE) 500 MG tablet Take 1 tablet (500 mg total) by mouth 2 (two) times daily with a meal. 60 tablet 3  . metoprolol tartrate (LOPRESSOR) 25 MG tablet Take one tablet by mouth twice a day.  May take one additional tablet by mouth as needed for breakthrough palpitations. 270 tablet 3  . montelukast (SINGULAIR) 10 MG tablet Take 1 tablet once at night for coughing or wheezing. 30 tablet 5  . Olopatadine HCl 0.2 % SOLN Apply 1 drop to eye daily as needed (allergies). 7.5 mL 3  . pantoprazole (PROTONIX) 40 MG tablet Take 1 tablet (40 mg total) by mouth 2 (two) times daily. 60 tablet 0  . PRESCRIPTION MEDICATION Inhale into the lungs at bedtime. CPAP    . rivaroxaban (XARELTO) 20 MG TABS tablet Take 1 tablet (20 mg total) by mouth daily with supper. 90 tablet 3  . sildenafil (REVATIO) 20 MG tablet Take 1-2 tablets (20-40 mg total) by mouth as needed. 60 tablet 1  . Vitamin D, Ergocalciferol, (DRISDOL) 1.25 MG (50000 UT) CAPS capsule Take 1 capsule (50,000 Units total) by mouth every 7 (seven) days. 4 capsule 0   No current facility-administered medications on file prior to visit.    BP 137/82   Pulse 73   Temp 98 F (36.7 C)   Resp 20    Ht 5\' 9"  (1.753 m)   Wt 262 lb (118.8 kg)   SpO2 97%   BMI 38.69 kg/m      Objective:   Physical Exam  General Mental Status- Alert. General Appearance- Not in acute distress.   Skin General: Color- Normal Color. Moisture- Normal Moisture.  Neck Carotid Arteries- Normal color. Moisture- Normal Moisture. No carotid bruits. No JVD.  Chest and Lung Exam Auscultation: Breath Sounds:-Normal.  Cardiovascular Auscultation:Rythm- Regular. Murmurs & Other Heart Sounds:Auscultation of the heart reveals- No Murmurs.  Abdomen Inspection:-Inspeection Normal. Palpation/Percussion:Note:No mass. Palpation and Percussion of the abdomen reveal- Non Tender, Non Distended + BS, no rebound or guarding.   Neurologic Cranial Nerve exam:- CN III-XII intact(No nystagmus), symmetric smile. Strength:- 5/5 equal and symmetric strength both upper and lower extremities.  Lower ext- no pedal edema.      Assessment & Plan:  For htn and hx atrial fib/flutter, continue with metoprolol, flecanide and xarelto. Continue to follow up with cardiologist Dr. . Bp well controlled.  For hx of asthma, continue with symbicort inhaler.  For recent possible bronchitis and hx of allergies continue with azithromycin antibiotic, astelin nasal spray, montelukast and xyzal anthistamine.  For cough rx benzonatate.  If cough is persisting get chest xray.   For high cholesterol continue zetia and fenofibrate. Will put in future cmp and lipid panel.  For gerd use protonix.  Follow up in 6 weeks or as needed  Ladona Ridgel, [employer]

## 2020-11-11 NOTE — Patient Instructions (Addendum)
For htn and hx atrial fib/flutter, continue with metoprolol, flecanide and xarelto. Continue to follow up with cardiologist [name]. Bp is well controlled.  For hx of asthma, continue with symbicort inhaler.  For recent possible bronchitis and hx of allergies continue with azithromycin antibiotic, astelin nasal spray, montelukast and xyzal anthistamine.  For cough rx benzonatate.  If cough is persisting get chest xray.   For high cholesterol continue zetia and fenofibrate. Will put in future cmp and lipid panel.  For gerd use protonix.  Follow up in 6 weeks or as needed

## 2020-11-12 ENCOUNTER — Ambulatory Visit: Payer: Medicare Other

## 2020-11-12 ENCOUNTER — Other Ambulatory Visit: Payer: Self-pay

## 2020-11-12 DIAGNOSIS — M5412 Radiculopathy, cervical region: Secondary | ICD-10-CM | POA: Diagnosis not present

## 2020-11-12 DIAGNOSIS — M25561 Pain in right knee: Secondary | ICD-10-CM

## 2020-11-12 DIAGNOSIS — M545 Low back pain, unspecified: Secondary | ICD-10-CM

## 2020-11-12 DIAGNOSIS — G8929 Other chronic pain: Secondary | ICD-10-CM

## 2020-11-12 DIAGNOSIS — M6281 Muscle weakness (generalized): Secondary | ICD-10-CM

## 2020-11-12 DIAGNOSIS — R262 Difficulty in walking, not elsewhere classified: Secondary | ICD-10-CM

## 2020-11-12 DIAGNOSIS — M25562 Pain in left knee: Secondary | ICD-10-CM

## 2020-11-12 NOTE — Therapy (Signed)
[facility] [address]  [address] [place], [place], [address] Phone: [phone]   Fax:  [phone]  Physical Therapy Treatment  Patient Details  Name: Richard Walker MRN: [id number] Date of Birth: 1955/11/16 Referring Provider (PT): Clare Gandy, MD   Encounter Date: 11/12/2020   PT End of Session - 11/12/20 1821    Visit Number 3    Number of Visits 17    Date for PT Re-Evaluation 12/31/20    Authorization Type Medicare & Traditional Medicaid    PT Start Time 1451    PT Stop Time 1539    PT Time Calculation (min) 48 min    Activity Tolerance Patient tolerated treatment well;Patient limited by pain    Behavior During Therapy [facility] for tasks assessed/performed           Past Medical History:  Diagnosis Date  . DDD (degenerative disc disease), lumbar    L3/L4  . Diverticulosis   . GERD (gastroesophageal reflux disease)   . Hyperlipidemia   . Low sperm motility   . Pre-diabetes   . Sleep apnea    uses cpap   . SVT (supraventricular tachycardia) (HCC)     Past Surgical History:  Procedure Laterality Date  . 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 REPAIR OF LEFT INDEX AND LONG FINGER BY FUSION /  REPAIR OF RING FINGER;  Surgeon: Mack Hook, MD;  Location: [facility] OR;  Service: Orthopedics;  Laterality: Left;  . BACK SURGERY    . CARDIAC CATHETERIZATION N/A 12/22/2015   Procedure: Left Heart Cath and Coronary Angiography;  Surgeon: Peter M Swaziland, MD;  Location: [facility] INVASIVE CV LAB;  Service: Cardiovascular;  Laterality: N/A;  . CARDIAC ELECTROPHYSIOLOGY STUDY AND ABLATION    . ELECTROPHYSIOLOGIC STUDY N/A 02/19/2016   Procedure: SVT Ablation;  Surgeon: Marinus Maw, MD;  Location: [facility] INVASIVE CV LAB;  Service: Cardiovascular;  Laterality: N/A;  . septal reconstruction  2017  . TURBINATE REDUCTION Bilateral 07/2016  . WISDOM TOOTH EXTRACTION      There were no vitals filed for this visit.   Subjective  Assessment - 11/12/20 1455    Subjective Pt notes that his dizziness is getting worse when he rotates to the L side.    Pertinent History none    Diagnostic tests Per MD- "Independent review of the CT lumbar spine from 2/15 shows stable interbody fusion at L4/5 and degenerative disc disease at L5-S1.    Independent review of the CT cervical spine 2/15 shows severe degenerative change at C5-6.    Independent review the B knee x-ray from 2/15 shows no acute changes."    Patient Stated Goals decrease pain and dizziness    Currently in Pain? Yes    Pain Score 6     Pain Location Neck    Pain Orientation Left    Pain Descriptors / Indicators Sharp    Pain Type Acute pain    Pain Score 8    Pain Location Knee    Pain Orientation Right;Left;Anterior    Pain Descriptors / Indicators Sharp    Pain Type Acute pain    Pain Score 7    Pain Location Back    Pain Orientation Right;Left;Lower    Pain Descriptors / Indicators Aching              [facility] PT Assessment - 11/12/20 0001      AROM   AROM Assessment Site Knee;Lumbar  Right/Left Knee Right;Left    Right Knee Extension 0    Right Knee Flexion 124    Left Knee Extension 0    Left Knee Flexion 121    Lumbar Flexion hands right below knee joint line    Lumbar Extension 50% limited   pain   Lumbar - Right Side Bend hand down to knee jointline    Lumbar - Left Side Bend hand down to fibular head    Lumbar - Right Rotation 25% limited   pain   Lumbar - Left Rotation 25% limited   pain     Strength   Strength Assessment Site Knee;Hip    Right/Left Hip Right;Left    Right Hip Flexion 4-/5    Right Hip ABduction 4-/5    Right Hip ADduction 4-/5    Left Hip Flexion 3+/5    Left Hip ABduction 4-/5    Left Hip ADduction 4-/5    Right/Left Knee Right;Left    Right Knee Flexion 5/5    Right Knee Extension 4+/5    Left Knee Flexion 4-/5   pain   Left Knee Extension 4-/5                         [facility] PT  Treatment/Exercise - 11/12/20 0001      Exercises   Exercises Neck;Shoulder;Lumbar;Knee/Hip      Neck Exercises: Machines for Strengthening   Nustep L2x596min      Neck Exercises: Seated   Neck Retraction 10 reps;5 secs    Shoulder Flexion Both;10 reps    Shoulder Flexion Limitations AAROM with hands clapsed together    Other Seated Exercise Ts with back 5 reps c/o pain had to stop after 5th rep      Knee/Hip Exercises: Seated   Long Arc Quad Both;1 set;10 reps;AROM    Marching Both;1 set;AROM;10 reps      Modalities   Modalities Moist Heat      Moist Heat Therapy   Number Minutes Moist Heat 10 Minutes    Moist Heat Location Cervical   L side                   PT Short Term Goals - 11/05/20 1716      PT SHORT TERM GOAL #1   Title Patient to be independent with initial HEP.    Time 3    Period Weeks    Status New    Target Date 11/26/20             PT Long Term Goals - 11/05/20 1716      PT LONG TERM GOAL #1   Title Patient to be independent with advanced HEP.    Time 8    Period Weeks    Status New    Target Date 12/31/20      PT LONG TERM GOAL #2   Title Patient to demonstrate L UE strength >/=4+/5.    Time 8    Period Weeks    Status New    Target Date 12/31/20      PT LONG TERM GOAL #3   Title Patient to demonstrate B LE strength >/=4+/5.    Time 8    Period Weeks    Status New    Target Date 12/31/20      PT LONG TERM GOAL #4   Title Patient to demonstrate cervical AROM WFL and without pain limiting.    Time 8  Period Weeks    Status New    Target Date 12/31/20      PT LONG TERM GOAL #5   Title Patient to report tolerance for kneeling to pray d/t improvement in pain levels.    Time 8    Period Weeks    Status New    Target Date 12/31/20      Additional Long Term Goals   Additional Long Term Goals Yes      PT LONG TERM GOAL #6   Title Patient to report tolerance for 2 hours on his feet without pain limiting.    Time 8     Period Weeks    Status New    Target Date 12/31/20      PT LONG TERM GOAL #7   Title Patient to report 90% improvement in dizziness.    Time 8    Period Weeks    Status New    Target Date 12/31/20                 Plan - 11/12/20 1823    Clinical Impression Statement Pt still had complaints of pain mostly with knee extension along with posterior shoulder/neck pain when doing the exercises. He has good ROM in B knees but c/o pain with the end ranges both directions. His lumbar ROM was motly limited in extension by tightness and B rotation d/t pain. Did some light exercises today with a good response from pt, he did have a report of pain with the horizontal abduction exercises in sitting and had to stop d/t th pain. Tried some moist heat at the end of session to his L cervical area, he noted that it helped a lot and will try to do so at home.    Personal Factors and Comorbidities Age;Comorbidity 1;Fitness;Past/Current Experience;Time since onset of injury/illness/exacerbation    Comorbidities back surgery L3-4 1997    PT Frequency 2x / week    PT Duration 8 weeks    PT Treatment/Interventions ADLs/Self Care Home Management;Canalith Repostioning;Cryotherapy;Electrical Stimulation;Iontophoresis 4mg /ml Dexamethasone;Moist Heat;Traction;Balance training;Therapeutic exercise;Therapeutic activities;Functional mobility training;Stair training;Gait training;Ultrasound;Neuromuscular re-education;Patient/family education;Manual techniques;Vestibular;Vasopneumatic Device;Taping;Energy conservation;Dry needling;Passive range of motion;Scar mobilization    PT Next Visit Plan reassess HEP, palpate neck & shoulders, knees, LB; assess knee & lumbar AROM, LE MMT, vestibular assessment    Consulted and Agree with Plan of Care Patient           Patient will benefit from skilled therapeutic intervention in order to improve the following deficits and impairments:  Abnormal gait,Hypomobility,Decreased  activity tolerance,Decreased strength,Increased fascial restricitons,Impaired UE functional use,Pain,Difficulty walking,Decreased balance,Increased muscle spasms,Improper body mechanics,Dizziness,Decreased range of motion,Impaired flexibility,Postural dysfunction  Visit Diagnosis: Radiculopathy, cervical region  Muscle weakness (generalized)  Chronic pain of left knee  Acute pain of right knee  Bilateral low back pain, unspecified chronicity, unspecified whether sciatica present  Difficulty in walking, not elsewhere classified     Problem List Patient Active Problem List   Diagnosis Date Noted  . Hypertension 11/03/2020  . Contusion of bone 10/23/2020  . Degenerative tear of left medial meniscus 10/23/2020  . Concussion with no loss of consciousness 10/23/2020  . Paroxysmal atrial fibrillation (HCC) 05/22/2019  . Unspecified atrial flutter (HCC) 05/22/2019  . Pain in right foot 03/30/2018  . Perennial and seasonal allergic rhinitis 11/09/2017  . History of food allergy 11/09/2017  . Wheezing 11/09/2017  . Dyspnea/wheezing 10/31/2017  . Cough 10/31/2017  . Bronchitis, mucopurulent recurrent (HCC) 09/07/2017  . SVT (supraventricular tachycardia) (HCC) 04/23/2015  .  Abdominal pain, epigastric 06/03/2013  . Unspecified vitamin D deficiency 04/09/2013  . Esophageal reflux 04/09/2013  . Hepatitis B carrier (HCC) 04/09/2013  . Other and unspecified hyperlipidemia 04/09/2013  . Unspecified sleep apnea 04/09/2013    Darleene Cleaver, PTA 11/12/2020, 6:38 PM  [facility] [address]  [address] [place], [place], [address] Phone: [phone]   Fax:  [phone]  Name: Richard Walker MRN: [id number] Date of Birth: 07-Aug-1956

## 2020-11-13 ENCOUNTER — Ambulatory Visit (INDEPENDENT_AMBULATORY_CARE_PROVIDER_SITE_OTHER): Payer: Medicare Other | Admitting: Family Medicine

## 2020-11-13 ENCOUNTER — Ambulatory Visit: Payer: Self-pay

## 2020-11-13 ENCOUNTER — Other Ambulatory Visit: Payer: Self-pay

## 2020-11-13 ENCOUNTER — Encounter: Payer: Self-pay | Admitting: Family Medicine

## 2020-11-13 VITALS — BP 138/80 | Ht 69.0 in | Wt 262.0 lb

## 2020-11-13 DIAGNOSIS — M23204 Derangement of unspecified medial meniscus due to old tear or injury, left knee: Secondary | ICD-10-CM

## 2020-11-13 DIAGNOSIS — S060X0D Concussion without loss of consciousness, subsequent encounter: Secondary | ICD-10-CM | POA: Diagnosis not present

## 2020-11-13 DIAGNOSIS — T148XXA Other injury of unspecified body region, initial encounter: Secondary | ICD-10-CM

## 2020-11-13 DIAGNOSIS — M542 Cervicalgia: Secondary | ICD-10-CM | POA: Diagnosis not present

## 2020-11-13 DIAGNOSIS — R42 Dizziness and giddiness: Secondary | ICD-10-CM | POA: Diagnosis present

## 2020-11-13 MED ORDER — TRIAMCINOLONE ACETONIDE 40 MG/ML IJ SUSP
40.0000 mg | Freq: Once | INTRAMUSCULAR | Status: AC
  Administered 2020-11-13: 40 mg via INTRA_ARTICULAR

## 2020-11-13 MED ORDER — MECLIZINE HCL 25 MG PO TABS: 25.0000 mg | ORAL_TABLET | Freq: Two times a day (BID) | ORAL | 0 refills | Status: DC | PRN

## 2020-11-13 MED ORDER — CYCLOBENZAPRINE HCL 10 MG PO TABS: 10.0000 mg | ORAL_TABLET | Freq: Three times a day (TID) | ORAL | 0 refills | Status: DC | PRN
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
## 2020-11-13 NOTE — Progress Notes (Signed)
Richard Walker - 65 y.o. male MRN [id number]  Date of birth: 12/23/1955  SUBJECTIVE:  Including CC & ROS.  No chief complaint on file.   Richard Walker is a 65 y.o. male that is presenting with ongoing and worsening dizziness and neck pain and left knee pain.  He has been through a couple sessions of physical therapy.  He seems to mostly get vertigo when he turns to the left.   Review of Systems See HPI   HISTORY: Past Medical, Surgical, Social, and Family History Reviewed & Updated per EMR.   Pertinent Historical Findings include:  Past Medical History:  Diagnosis Date  . DDD (degenerative disc disease), lumbar    L3/L4  . Diverticulosis   . GERD (gastroesophageal reflux disease)   . Hyperlipidemia   . Low sperm motility   . Pre-diabetes   . Sleep apnea    uses cpap   . SVT (supraventricular tachycardia) (HCC)     Past Surgical History:  Procedure Laterality Date  . 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 REPAIR OF LEFT INDEX AND LONG FINGER BY FUSION /  REPAIR OF RING FINGER;  Surgeon: Mack Hook, MD;  Location: [facility] OR;  Service: Orthopedics;  Laterality: Left;  . BACK SURGERY    . CARDIAC CATHETERIZATION N/A 12/22/2015   Procedure: Left Heart Cath and Coronary Angiography;  Surgeon: Peter M Swaziland, MD;  Location: [facility] INVASIVE CV LAB;  Service: Cardiovascular;  Laterality: N/A;  . CARDIAC ELECTROPHYSIOLOGY STUDY AND ABLATION    . ELECTROPHYSIOLOGIC STUDY N/A 02/19/2016   Procedure: SVT Ablation;  Surgeon: Marinus Maw, MD;  Location: [facility] INVASIVE CV LAB;  Service: Cardiovascular;  Laterality: N/A;  . septal reconstruction  2017  . TURBINATE REDUCTION Bilateral 07/2016  . WISDOM TOOTH EXTRACTION      Family History  Problem Relation Age of Onset  . Hypertension Father   . Food Allergy Son   . Colon cancer Neg Hx   . Stomach cancer Neg Hx   . Allergic rhinitis Neg Hx   . Angioedema Neg Hx   . Asthma Neg Hx   . Eczema Neg Hx   . Immunodeficiency Neg Hx   .  Urticaria Neg Hx     Social History   Socioeconomic History  . Marital status: Married    Spouse name: Not on file  . Number of children: Not on file  . Years of education: Not on file  . Highest education level: Not on file  Occupational History  . Not on file  Tobacco Use  . Smoking status: Former Smoker    Packs/day: 1.00    Years: 10.00    Pack years: 10.00    Types: Cigarettes    Quit date: 1992    Years since quitting: 30.2  . Smokeless tobacco: Never Used  . Tobacco comment: Uses Hookah Pipe occasionally  Vaping Use  . Vaping Use: Never used  Substance and Sexual Activity  . Alcohol use: No  . Drug use: No  . Sexual activity: Yes  Other Topics Concern  . Not on file  Social History Narrative   Marital Status: Married Engineer, drilling)   Children:  4 (3 Sons/1 Daughter)   Pets:  None    Living Situation: Lives with spouse and 4 children   Origin:  He was born in [place].   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  Alcohol Use:  None   Drug Use:  None   Diet:  Regular   Exercise:  None   Hobbies:  Cards   Social Determinants of Health   Financial Resource Strain: Not on file  Food Insecurity: Not on file  Transportation Needs: Not on file  Physical Activity: Not on file  Stress: Not on file  Social Connections: Not on file  Intimate Partner Violence: Not on file     PHYSICAL EXAM:  VS: BP 138/80 (BP Location: Left Arm, Patient Position: Sitting, Cuff Size: Large)   Ht 5\' 9"  (1.753 m)   Wt 262 lb (118.8 kg)   BMI 38.69 kg/m  Physical Exam Gen: NAD, alert, cooperative with exam, well-appearing MSK:  Left knee: No obvious effusion. Normal range of motion. Normal strength resistance   Aspiration/Injection Procedure Note KRISH BAILLY 1955/10/13  Procedure: Injection Indications: Left knee pain  Procedure  Details Consent: Risks of procedure as well as the alternatives and risks of each were explained to the (patient/caregiver).  Consent for procedure obtained. Time Out: Verified patient identification, verified procedure, site/side was marked, verified correct patient position, special equipment/implants available, medications/allergies/relevent history reviewed, required imaging and test results available.  Performed.  The area was cleaned with iodine and alcohol swabs.    The Left knee superior lateral suprapatellar pouch was injected using 1 cc's of 40 mg Kenalog and 4 cc's of 0.25% bupivacaine with a 22 1 1/2" needle.  Ultrasound was used. Images were obtained in long views showing the injection.     A sterile dressing was applied.  Patient did tolerate procedure well.     ASSESSMENT & PLAN:   Degenerative tear of left medial meniscus Acute pain.  Has started physical therapy. -Counseled on home exercise therapy and supportive care. -Injection. -Continue physical therapy. -Could consider further imaging  Contusion of bone Doing well with little to no pain. -Counseled on home exercise therapy and supportive care -Can continue physical therapy.  Concussion with no loss of consciousness Having some dizziness and ongoing headache since the accident.  Has physical therapy set up. -Counseled on home exercise therapy and supportive care. -Continue physical therapy.  Neck pain Did have neck pain when he was first initially seen after the accident emergency department. -Flexeril. -Continue physical therapy.  Vertigo Has ongoing dizziness after the accident related to the concussion. -Meclizine.

## 2020-11-13 NOTE — Assessment & Plan Note (Addendum)
Acute pain.  Has started physical therapy. -Counseled on home exercise therapy and supportive care. -Injection. -Continue physical therapy. -Could consider further imaging

## 2020-11-13 NOTE — Patient Instructions (Signed)
Good to see you Please continue physical therapy  Please use ice on the knee as needed  Please try the meclizine for dizziness   Please try the flexeril for muscle aches   Please send me a message in MyChart with any questions or updates.  Please see me back in 4-6 weeks.   --Dr. Jordan Likes

## 2020-11-13 NOTE — Assessment & Plan Note (Signed)
Having some dizziness and ongoing headache since the accident.  Has physical therapy set up. -Counseled on home exercise therapy and supportive care. -Continue physical therapy.

## 2020-11-13 NOTE — Assessment & Plan Note (Signed)
Doing well with little to no pain. -Counseled on home exercise therapy and supportive care -Can continue physical therapy.

## 2020-11-17 ENCOUNTER — Ambulatory Visit: Payer: Medicare Other | Admitting: Physical Therapy

## 2020-11-20 ENCOUNTER — Ambulatory Visit: Payer: Medicare Other | Admitting: Physical Therapy

## 2020-11-24 ENCOUNTER — Other Ambulatory Visit: Payer: Self-pay

## 2020-11-24 ENCOUNTER — Ambulatory Visit: Payer: Medicare Other | Attending: Family Medicine

## 2020-11-24 DIAGNOSIS — R262 Difficulty in walking, not elsewhere classified: Secondary | ICD-10-CM | POA: Diagnosis present

## 2020-11-24 DIAGNOSIS — M5412 Radiculopathy, cervical region: Secondary | ICD-10-CM | POA: Insufficient documentation

## 2020-11-24 DIAGNOSIS — G8929 Other chronic pain: Secondary | ICD-10-CM | POA: Diagnosis present

## 2020-11-24 DIAGNOSIS — R42 Dizziness and giddiness: Secondary | ICD-10-CM | POA: Insufficient documentation

## 2020-11-24 DIAGNOSIS — M6281 Muscle weakness (generalized): Secondary | ICD-10-CM | POA: Insufficient documentation

## 2020-11-24 DIAGNOSIS — M25561 Pain in right knee: Secondary | ICD-10-CM | POA: Insufficient documentation

## 2020-11-24 DIAGNOSIS — M25562 Pain in left knee: Secondary | ICD-10-CM | POA: Insufficient documentation

## 2020-11-24 DIAGNOSIS — M545 Low back pain, unspecified: Secondary | ICD-10-CM | POA: Insufficient documentation

## 2020-11-24 NOTE — Therapy (Signed)
[facility] [address]  [address] [place], [place], [address] Phone: [phone]   Fax:  [phone]  Physical Therapy Treatment  Patient Details  Name: Richard Walker MRN: [id number] Date of Birth: 08/22/1956 Referring Provider (PT): Clare Gandy, MD   Encounter Date: 11/24/2020   PT End of Session - 11/24/20 1527    Visit Number 4    Number of Visits 17    Date for PT Re-Evaluation 12/31/20    Authorization Type Medicare & Traditional Medicaid    PT Start Time 1445    PT Stop Time 1537    PT Time Calculation (min) 52 min    Activity Tolerance Patient tolerated treatment well;Patient limited by pain    Behavior During Therapy [facility] for tasks assessed/performed           Past Medical History:  Diagnosis Date  . DDD (degenerative disc disease), lumbar    L3/L4  . Diverticulosis   . GERD (gastroesophageal reflux disease)   . Hyperlipidemia   . Low sperm motility   . Pre-diabetes   . Sleep apnea    uses cpap   . SVT (supraventricular tachycardia) (HCC)     Past Surgical History:  Procedure Laterality Date  . 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 REPAIR OF LEFT INDEX AND LONG FINGER BY FUSION /  REPAIR OF RING FINGER;  Surgeon: Mack Hook, MD;  Location: [facility] OR;  Service: Orthopedics;  Laterality: Left;  . BACK SURGERY    . CARDIAC CATHETERIZATION N/A 12/22/2015   Procedure: Left Heart Cath and Coronary Angiography;  Surgeon: Peter M Swaziland, MD;  Location: [facility] INVASIVE CV LAB;  Service: Cardiovascular;  Laterality: N/A;  . CARDIAC ELECTROPHYSIOLOGY STUDY AND ABLATION    . ELECTROPHYSIOLOGIC STUDY N/A 02/19/2016   Procedure: SVT Ablation;  Surgeon: Marinus Maw, MD;  Location: [facility] INVASIVE CV LAB;  Service: Cardiovascular;  Laterality: N/A;  . septal reconstruction  2017  . TURBINATE REDUCTION Bilateral 07/2016  . WISDOM TOOTH EXTRACTION      There were no vitals filed for this visit.   Subjective  Assessment - 11/24/20 1449    Subjective Pt notes that the medications have been helping that the doctor gave him. Recieved a cortizone injection also in the L knee which helped as well.    Pertinent History none    Diagnostic tests Per MD- "Independent review of the CT lumbar spine from 2/15 shows stable interbody fusion at L4/5 and degenerative disc disease at L5-S1.    Independent review of the CT cervical spine 2/15 shows severe degenerative change at C5-6.    Independent review the B knee x-ray from 2/15 shows no acute changes."    Patient Stated Goals decrease pain and dizziness    Currently in Pain? Yes    Pain Score 5     Pain Location Knee    Pain Orientation Left    Pain Descriptors / Indicators Sharp    Pain Type Acute pain    Pain Score 5    Pain Location Neck    Pain Orientation Right;Left    Pain Descriptors / Indicators Sharp    Pain Type Acute pain                             [facility] Adult PT Treatment/Exercise - 11/24/20 0001      Exercises   Exercises Neck;Shoulder;Lumbar;Knee/Hip  Neck Exercises: Machines for Strengthening   Nustep L2x24min      Neck Exercises: Standing   Other Standing Exercises wall wash B flexion and circles 10x shoulders down and retracted      Knee/Hip Exercises: Seated   Long Arc Quad Strengthening;Both;1 set;10 reps      Knee/Hip Exercises: Supine   Bridges Strengthening;Both;1 set;10 reps    Straight Leg Raises Strengthening;Left;1 set;10 reps    Other Supine Knee/Hip Exercises bent knee raise 10 reps R tband      Shoulder Exercises: Seated   Flexion AROM;Both;10 reps    Other Seated Exercises scaption 10 reps; some pain in L arm during last reps      Modalities   Modalities Moist Heat      Moist Heat Therapy   Number Minutes Moist Heat 10 Minutes    Moist Heat Location Cervical                    PT Short Term Goals - 11/24/20 1535      PT SHORT TERM GOAL #1   Title Patient to be  independent with initial HEP.    Time 3    Period Weeks    Status On-going    Target Date 11/26/20             PT Long Term Goals - 11/24/20 1535      PT LONG TERM GOAL #1   Title Patient to be independent with advanced HEP.    Time 8    Period Weeks    Status On-going      PT LONG TERM GOAL #2   Title Patient to demonstrate L UE strength >/=4+/5.    Time 8    Period Weeks    Status On-going      PT LONG TERM GOAL #3   Title Patient to demonstrate B LE strength >/=4+/5.    Time 8    Period Weeks    Status On-going      PT LONG TERM GOAL #4   Title Patient to demonstrate cervical AROM WFL and without pain limiting.    Time 8    Period Weeks    Status On-going      PT LONG TERM GOAL #5   Title Patient to report tolerance for kneeling to pray d/t improvement in pain levels.    Time 8    Period Weeks    Status On-going      PT LONG TERM GOAL #6   Title Patient to report tolerance for 2 hours on his feet without pain limiting.    Time 8    Period Weeks    Status On-going      PT LONG TERM GOAL #7   Title Patient to report 90% improvement in dizziness.    Time 8    Period Weeks    Status On-going                 Plan - 11/24/20 1527    Clinical Impression Statement Pt had a good response to treatment, he had less complaints today of pain even though still limited mostly with cervical rotation and L shoulder flexion. He reported an increase in radiating pain in the L UE during OH movements. Pt also still c/o dizziness when trying to rotate head to the L. He showed a good demonstration of the exercises, onced cued for correct position and to correct form. Moist heat applied post session to increase  tissue extensibility and for pain.    Personal Factors and Comorbidities Age;Comorbidity 1;Fitness;Past/Current Experience;Time since onset of injury/illness/exacerbation    Comorbidities back surgery L3-4 1997    PT Frequency 2x / week    PT Duration 8 weeks     PT Treatment/Interventions ADLs/Self Care Home Management;Canalith Repostioning;Cryotherapy;Electrical Stimulation;Iontophoresis 4mg /ml Dexamethasone;Moist Heat;Traction;Balance training;Therapeutic exercise;Therapeutic activities;Functional mobility training;Stair training;Gait training;Ultrasound;Neuromuscular re-education;Patient/family education;Manual techniques;Vestibular;Vasopneumatic Device;Taping;Energy conservation;Dry needling;Passive range of motion;Scar mobilization    PT Next Visit Plan reassess HEP, palpate neck & shoulders, knees, LB; assess knee & lumbar AROM, LE MMT, vestibular assessment    Consulted and Agree with Plan of Care Patient           Patient will benefit from skilled therapeutic intervention in order to improve the following deficits and impairments:  Abnormal gait,Hypomobility,Decreased activity tolerance,Decreased strength,Increased fascial restricitons,Impaired UE functional use,Pain,Difficulty walking,Decreased balance,Increased muscle spasms,Improper body mechanics,Dizziness,Decreased range of motion,Impaired flexibility,Postural dysfunction  Visit Diagnosis: Radiculopathy, cervical region  Muscle weakness (generalized)  Chronic pain of left knee  Acute pain of right knee  Bilateral low back pain, unspecified chronicity, unspecified whether sciatica present  Difficulty in walking, not elsewhere classified     Problem List Patient Active Problem List   Diagnosis Date Noted  . Neck pain 11/13/2020  . Vertigo 11/13/2020  . Hypertension 11/03/2020  . Contusion of bone 10/23/2020  . Degenerative tear of left medial meniscus 10/23/2020  . Concussion with no loss of consciousness 10/23/2020  . Paroxysmal atrial fibrillation (HCC) 05/22/2019  . Unspecified atrial flutter (HCC) 05/22/2019  . Pain in right foot 03/30/2018  . Perennial and seasonal allergic rhinitis 11/09/2017  . History of food allergy 11/09/2017  . Wheezing 11/09/2017  .  Dyspnea/wheezing 10/31/2017  . Cough 10/31/2017  . Bronchitis, mucopurulent recurrent (HCC) 09/07/2017  . SVT (supraventricular tachycardia) (HCC) 04/23/2015  . Abdominal pain, epigastric 06/03/2013  . Unspecified vitamin D deficiency 04/09/2013  . Esophageal reflux 04/09/2013  . Hepatitis B carrier (HCC) 04/09/2013  . Other and unspecified hyperlipidemia 04/09/2013  . Unspecified sleep apnea 04/09/2013    04/11/2013, PTA 11/24/2020, 3:56 PM  [facility] [address]  [address] [place], [place], [place] Phone: [phone]   Fax:  [phone]  Name: EDREES VALENT MRN: Parthenia Ames Date of Birth: 1955-09-27

## 2020-11-27 ENCOUNTER — Ambulatory Visit: Payer: Medicare Other | Admitting: Physical Therapy

## 2020-11-27 ENCOUNTER — Encounter: Payer: Self-pay | Admitting: Physical Therapy

## 2020-11-27 ENCOUNTER — Other Ambulatory Visit: Payer: Self-pay

## 2020-11-27 DIAGNOSIS — M5412 Radiculopathy, cervical region: Secondary | ICD-10-CM

## 2020-11-27 DIAGNOSIS — M25561 Pain in right knee: Secondary | ICD-10-CM

## 2020-11-27 DIAGNOSIS — R262 Difficulty in walking, not elsewhere classified: Secondary | ICD-10-CM

## 2020-11-27 DIAGNOSIS — M545 Low back pain, unspecified: Secondary | ICD-10-CM

## 2020-11-27 DIAGNOSIS — M6281 Muscle weakness (generalized): Secondary | ICD-10-CM

## 2020-11-27 DIAGNOSIS — R42 Dizziness and giddiness: Secondary | ICD-10-CM

## 2020-11-27 DIAGNOSIS — M25562 Pain in left knee: Secondary | ICD-10-CM

## 2020-11-27 DIAGNOSIS — G8929 Other chronic pain: Secondary | ICD-10-CM
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
## 2020-12-01 ENCOUNTER — Ambulatory Visit: Payer: Medicare Other | Admitting: Physical Therapy

## 2020-12-01 ENCOUNTER — Encounter: Payer: Self-pay | Admitting: Physical Therapy

## 2020-12-01 ENCOUNTER — Other Ambulatory Visit: Payer: Self-pay

## 2020-12-01 DIAGNOSIS — R42 Dizziness and giddiness: Secondary | ICD-10-CM

## 2020-12-01 DIAGNOSIS — M25562 Pain in left knee: Secondary | ICD-10-CM

## 2020-12-01 DIAGNOSIS — M5412 Radiculopathy, cervical region: Secondary | ICD-10-CM

## 2020-12-01 DIAGNOSIS — R262 Difficulty in walking, not elsewhere classified: Secondary | ICD-10-CM

## 2020-12-01 DIAGNOSIS — G8929 Other chronic pain: Secondary | ICD-10-CM

## 2020-12-01 DIAGNOSIS — M545 Low back pain, unspecified: Secondary | ICD-10-CM

## 2020-12-01 DIAGNOSIS — M25561 Pain in right knee: Secondary | ICD-10-CM

## 2020-12-01 DIAGNOSIS — M6281 Muscle weakness (generalized): Secondary | ICD-10-CM

## 2020-12-01 NOTE — Therapy (Signed)
Firkus MRN: [id number] Date of Birth: [birth year]  Firkus MRN: [id number] Date of Birth: [birth year]  [facility] Outpatient Rehabilitation [facility] [address]  [address] [place], [place], [address] Phone: [phone]   Fax:  [phone]  Physical Therapy Treatment  Patient Details  Name: Richard Walker MRN: [id number] Date of Birth: April 05, 1956 Referring Provider (PT): Clare Gandy, MD   Encounter Date: 12/01/2020   PT End of Session - 12/01/20 1615    Visit Number 6    Number of Visits 17    Date for PT Re-Evaluation 12/31/20    Authorization Type Medicare & Traditional Medicaid    PT Start Time 1533    PT Stop Time 1612    PT Time Calculation (min) 39 min    Activity Tolerance Patient tolerated treatment well    Behavior During Therapy [facility] for tasks assessed/performed           Past Medical History:  Diagnosis Date  . DDD (degenerative disc disease), lumbar    L3/L4  . Diverticulosis   . GERD (gastroesophageal reflux disease)   . Hyperlipidemia   . Low sperm motility   . Pre-diabetes   . Sleep apnea    uses cpap   . SVT (supraventricular tachycardia) (HCC)     Past Surgical History:  Procedure Laterality Date  . 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 REPAIR OF LEFT INDEX AND LONG FINGER BY FUSION /  REPAIR OF RING FINGER;  Surgeon: Mack Hook, MD;  Location: [facility] OR;  Service: Orthopedics;  Laterality: Left;  . BACK SURGERY    . CARDIAC CATHETERIZATION N/A 12/22/2015   Procedure: Left Heart Cath and Coronary Angiography;  Surgeon: Peter M Swaziland, MD;  Location: [facility] INVASIVE CV LAB;  Service: Cardiovascular;  Laterality: N/A;  . CARDIAC ELECTROPHYSIOLOGY STUDY AND ABLATION    . ELECTROPHYSIOLOGIC STUDY N/A 02/19/2016   Procedure: SVT Ablation;  Surgeon: Marinus Maw, MD;  Location: [facility] INVASIVE CV LAB;  Service: Cardiovascular;  Laterality: N/A;  . septal reconstruction  2017  . TURBINATE REDUCTION Bilateral 07/2016  . WISDOM TOOTH EXTRACTION      There were no vitals filed for this visit.   Subjective Assessment - 12/01/20 1534     Subjective Dizziness is not better since last session. Unsure if he fully followed the post-epley precautions. Current dizziness at rest: 0/10. B knees are bothering him more today without known cause.    Pertinent History none    Diagnostic tests Per MD- "Independent review of the CT lumbar spine from 2/15 shows stable interbody fusion at L4/5 and degenerative disc disease at L5-S1.    Independent review of the CT cervical spine 2/15 shows severe degenerative change at C5-6.    Independent review the B knee x-ray from 2/15 shows no acute changes."    Patient Stated Goals decrease pain and dizziness    Currently in Pain? Yes    Pain Score 9     Pain Location Knee    Pain Orientation Left    Pain Descriptors / Indicators Sharp    Pain Type Acute pain    Pain Score 6    Pain Location Back    Pain Orientation Left;Right;Lower    Pain Descriptors / Indicators Aching    Pain Type Acute pain                   Vestibular Assessment - 12/01/20 0001      Positional Testing   Horizontal Canal Testing Horizontal Canal Right;Horizontal Canal Left      Horizontal Canal Right  Firkus MRN: [id number] Date of Birth: [birth year]

## 2020-12-04 ENCOUNTER — Emergency Department (HOSPITAL_BASED_OUTPATIENT_CLINIC_OR_DEPARTMENT_OTHER)
Admission: EM | Admit: 2020-12-04 | Discharge: 2020-12-05 | Disposition: A | Discharge status: Home / Self Care | Payer: Medicare Other | Attending: Emergency Medicine | Admitting: Emergency Medicine

## 2020-12-04 ENCOUNTER — Other Ambulatory Visit: Payer: Self-pay

## 2020-12-04 ENCOUNTER — Ambulatory Visit: Payer: Medicare Other

## 2020-12-04 ENCOUNTER — Encounter (HOSPITAL_BASED_OUTPATIENT_CLINIC_OR_DEPARTMENT_OTHER): Payer: Self-pay | Admitting: Emergency Medicine

## 2020-12-04 DIAGNOSIS — M25561 Pain in right knee: Secondary | ICD-10-CM

## 2020-12-04 DIAGNOSIS — M5412 Radiculopathy, cervical region: Secondary | ICD-10-CM

## 2020-12-04 DIAGNOSIS — R112 Nausea with vomiting, unspecified: Secondary | ICD-10-CM | POA: Insufficient documentation

## 2020-12-04 DIAGNOSIS — Z87891 Personal history of nicotine dependence: Secondary | ICD-10-CM | POA: Insufficient documentation

## 2020-12-04 DIAGNOSIS — R0789 Other chest pain: Secondary | ICD-10-CM | POA: Diagnosis not present

## 2020-12-04 DIAGNOSIS — R262 Difficulty in walking, not elsewhere classified: Secondary | ICD-10-CM

## 2020-12-04 DIAGNOSIS — R42 Dizziness and giddiness: Secondary | ICD-10-CM

## 2020-12-04 DIAGNOSIS — M25562 Pain in left knee: Secondary | ICD-10-CM

## 2020-12-04 DIAGNOSIS — Z79899 Other long term (current) drug therapy: Secondary | ICD-10-CM | POA: Diagnosis not present

## 2020-12-04 DIAGNOSIS — M545 Low back pain, unspecified: Secondary | ICD-10-CM

## 2020-12-04 DIAGNOSIS — Z7901 Long term (current) use of anticoagulants: Secondary | ICD-10-CM | POA: Insufficient documentation

## 2020-12-04 DIAGNOSIS — G8929 Other chronic pain: Secondary | ICD-10-CM

## 2020-12-04 DIAGNOSIS — I10 Essential (primary) hypertension: Secondary | ICD-10-CM | POA: Diagnosis not present

## 2020-12-04 DIAGNOSIS — M6281 Muscle weakness (generalized): Secondary | ICD-10-CM

## 2020-12-04 LAB — CBC
HCT: 47.1 % (ref 39.0–52.0)
Hemoglobin: 15.7 g/dL (ref 13.0–17.0)
MCH: 28.5 pg (ref 26.0–34.0)
MCHC: 33.3 g/dL (ref 30.0–36.0)
MCV: 85.6 fL (ref 80.0–100.0)
Platelets: 181 10*3/uL (ref 150–400)
RBC: 5.5 MIL/uL (ref 4.22–5.81)
RDW: 13.2 % (ref 11.5–15.5)
WBC: 8.3 10*3/uL (ref 4.0–10.5)
nRBC: 0 % (ref 0.0–0.2)

## 2020-12-04 LAB — COMPREHENSIVE METABOLIC PANEL WITH GFR
ALT: 24 U/L (ref 0–44)
AST: 21 U/L (ref 15–41)
Albumin: 4 g/dL (ref 3.5–5.0)
Alkaline Phosphatase: 53 U/L (ref 38–126)
Anion gap: 10 (ref 5–15)
BUN: 22 mg/dL (ref 8–23)
CO2: 24 mmol/L (ref 22–32)
Calcium: 9.3 mg/dL (ref 8.9–10.3)
Chloride: 102 mmol/L (ref 98–111)
Creatinine, Ser: 1.16 mg/dL (ref 0.61–1.24)
GFR, Estimated: >60 mL/min
Glucose, Bld: 113 mg/dL — ABNORMAL HIGH (ref 70–99)
Potassium: 3.7 mmol/L (ref 3.5–5.1)
Sodium: 136 mmol/L (ref 135–145)
Total Bilirubin: 0.5 mg/dL (ref 0.3–1.2)
Total Protein: 7.3 g/dL (ref 6.5–8.1)

## 2020-12-04 LAB — TROPONIN I (HIGH SENSITIVITY): Troponin I (High Sensitivity): 5 ng/L

## 2020-12-04 LAB — LIPASE, BLOOD: Lipase: 32 U/L (ref 11–51)

## 2020-12-04 MED ORDER — MECLIZINE HCL 25 MG PO TABS
25.0000 mg | ORAL_TABLET | Freq: Once | ORAL | Status: AC
  Administered 2020-12-04: 25 mg via ORAL
  Filled 2020-12-04: qty 1

## 2020-12-04 MED ORDER — ONDANSETRON HCL 4 MG/2ML IJ SOLN
4.0000 mg | Freq: Once | INTRAMUSCULAR | Status: AC
  Administered 2020-12-04: 4 mg via INTRAVENOUS
  Filled 2020-12-04: qty 2

## 2020-12-04 NOTE — ED Triage Notes (Signed)
Patient presents with complaints of sudden onset dizziness and nausea and vomiting; patient fasting during daylight hours; states emesis x 1 pta; States history of vertigo. States nausea resolved at this time.

## 2020-12-04 NOTE — Therapy (Signed)
[facility] Outpatient Rehabilitation [facility] [address]  [address] [place], [place], [address] Phone: [phone]   Fax:  [phone]  Physical Therapy Treatment  Patient Details  Name: Richard Walker MRN: [id number] Date of Birth: [birth year] Referring Provider (PT): Clare Gandy, MD   Encounter Date: 12/04/2020   PT End of Session - 12/04/20 1625    Visit Number 7    Number of Visits 17    Date for PT Re-Evaluation 12/31/20    Authorization Type Medicare & Traditional Medicaid    PT Start Time 1534    PT Stop Time 1613    PT Time Calculation (min) 39 min    Activity Tolerance Patient tolerated treatment well    Behavior During Therapy [facility] for tasks assessed/performed           Past Medical History:  Diagnosis Date  . DDD (degenerative disc disease), lumbar    L3/L4  . Diverticulosis   . GERD (gastroesophageal reflux disease)   . Hyperlipidemia   . Low sperm motility   . Pre-diabetes   . Sleep apnea    uses cpap   . SVT (supraventricular tachycardia) (HCC)     Past Surgical History:  Procedure Laterality Date  . 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 REPAIR OF LEFT INDEX AND LONG FINGER BY FUSION /  REPAIR OF RING FINGER;  Surgeon: Mack Hook, MD;  Location: [facility] OR;  Service: Orthopedics;  Laterality: Left;  . BACK SURGERY    . CARDIAC CATHETERIZATION N/A 12/22/2015   Procedure: Left Heart Cath and Coronary Angiography;  Surgeon: Peter M Swaziland, MD;  Location: [facility] INVASIVE CV LAB;  Service: Cardiovascular;  Laterality: N/A;  . CARDIAC ELECTROPHYSIOLOGY STUDY AND ABLATION    . ELECTROPHYSIOLOGIC STUDY N/A 02/19/2016   Procedure: SVT Ablation;  Surgeon: Marinus Maw, MD;  Location: [facility] INVASIVE CV LAB;  Service: Cardiovascular;  Laterality: N/A;  . septal reconstruction  2017  . TURBINATE REDUCTION Bilateral 07/2016  . WISDOM TOOTH EXTRACTION      There were no vitals filed for this visit.   Subjective Assessment - 12/04/20 1541     Subjective Pt reports no dizziness, just wants to be able to walk with his L knee.    Pertinent History none    Diagnostic tests Per MD- "Independent review of the CT lumbar spine from 2/15 shows stable interbody fusion at L4/5 and degenerative disc disease at L5-S1.    Independent review of the CT cervical spine 2/15 shows severe degenerative change at C5-6.    Independent review the B knee x-ray from 2/15 shows no acute changes."    Patient Stated Goals decrease pain and dizziness    Currently in Pain? Yes    Pain Score 8     Pain Location Knee    Pain Orientation Left    Pain Descriptors / Indicators Sharp    Pain Type Acute pain    Pain Score 7    Pain Location Knee    Pain Orientation Right    Pain Descriptors / Indicators Sharp    Pain Type Acute pain    Pain Score 6    Pain Location Back    Pain Orientation Right;Left    Pain Descriptors / Indicators Aching    Pain Type Acute pain                             [facility] Adult  PT Treatment/Exercise - 12/04/20 0001      Lumbar Exercises: Aerobic   Recumbent Bike L1x6 min   with moist heat     Lumbar Exercises: Standing   Other Standing Lumbar Exercises Ys and Ts 10 reps with back against wall      Knee/Hip Exercises: Machines for Strengthening   Cybex Knee Extension 5# 10 reps    Cybex Knee Flexion 10# 10 reps      Knee/Hip Exercises: Standing   Functional Squat 1 set;10 reps    Functional Squat Limitations TRX mini squat      Knee/Hip Exercises: Seated   Long Arc Quad Strengthening;Both;1 set;10 reps;Weights    Long Arc Quad Weight 2 lbs.    Hamstring Curl Strengthening;Both;1 set;10 reps    Hamstring Limitations R Tband      Modalities   Modalities Moist Heat      Moist Heat Therapy   Number Minutes Moist Heat 6 Minutes    Moist Heat Location Cervical   L upper shoulder     Manual Therapy   Manual Therapy Soft tissue mobilization    Manual therapy comments L UT, LS, rhomboids                     PT Short Term Goals - 12/04/20 1706      PT SHORT TERM GOAL #1   Title Patient to be independent with initial HEP.    Time 3    Period Weeks    Status Achieved    Target Date 11/26/20             PT Long Term Goals - 11/24/20 1535      PT LONG TERM GOAL #1   Title Patient to be independent with advanced HEP.    Time 8    Period Weeks    Status On-going      PT LONG TERM GOAL #2   Title Patient to demonstrate L UE strength >/=4+/5.    Time 8    Period Weeks    Status On-going      PT LONG TERM GOAL #3   Title Patient to demonstrate B LE strength >/=4+/5.    Time 8    Period Weeks    Status On-going      PT LONG TERM GOAL #4   Title Patient to demonstrate cervical AROM WFL and without pain limiting.    Time 8    Period Weeks    Status On-going      PT LONG TERM GOAL #5   Title Patient to report tolerance for kneeling to pray d/t improvement in pain levels.    Time 8    Period Weeks    Status On-going      PT LONG TERM GOAL #6   Title Patient to report tolerance for 2 hours on his feet without pain limiting.    Time 8    Period Weeks    Status On-going      PT LONG TERM GOAL #7   Title Patient to report 90% improvement in dizziness.    Time 8    Period Weeks    Status On-going                 Plan - 12/04/20 1648    Clinical Impression Statement Pt notes no dizziness throughout session. He notes that he really wants to work on strengthening his knees because he uses them a lot. Only mild reports of  pain in B knees during exercises but tolerable. Cues given for proper performance of exercises, focusing on decreasing stress on knees and keeping shoulders depressed/retracted. Did a little STM to L UT, levator, and rhomboids, pt had a lot of taut bands and tenderness in those areas but was able to tolerate massage. Good response overall.    Personal Factors and Comorbidities Age;Comorbidity 1;Fitness;Past/Current Experience;Time  since onset of injury/illness/exacerbation    Comorbidities back surgery L3-4 1997    PT Frequency 2x / week    PT Duration 8 weeks    PT Treatment/Interventions ADLs/Self Care Home Management;Canalith Repostioning;Cryotherapy;Electrical Stimulation;Iontophoresis 4mg /ml Dexamethasone;Moist Heat;Traction;Balance training;Therapeutic exercise;Therapeutic activities;Functional mobility training;Stair training;Gait training;Ultrasound;Neuromuscular re-education;Patient/family education;Manual techniques;Vestibular;Vasopneumatic Device;Taping;Energy conservation;Dry needling;Passive range of motion;Scar mobilization    PT Next Visit Plan progress ROM and strength; reassess R DH/sidelying test, progress VOR training    Consulted and Agree with Plan of Care Patient           Patient will benefit from skilled therapeutic intervention in order to improve the following deficits and impairments:  Abnormal gait,Hypomobility,Decreased activity tolerance,Decreased strength,Increased fascial restricitons,Impaired UE functional use,Pain,Difficulty walking,Decreased balance,Increased muscle spasms,Improper body mechanics,Dizziness,Decreased range of motion,Impaired flexibility,Postural dysfunction  Visit Diagnosis: Radiculopathy, cervical region  Muscle weakness (generalized)  Chronic pain of left knee  Acute pain of right knee  Bilateral low back pain, unspecified chronicity, unspecified whether sciatica present  Difficulty in walking, not elsewhere classified  Dizziness and giddiness     Problem List Patient Active Problem List   Diagnosis Date Noted  . Neck pain 11/13/2020  . Vertigo 11/13/2020  . Hypertension 11/03/2020  . Contusion of bone 10/23/2020  . Degenerative tear of left medial meniscus 10/23/2020  . Concussion with no loss of consciousness 10/23/2020  . Paroxysmal atrial fibrillation (HCC) 05/22/2019  . Unspecified atrial flutter (HCC) 05/22/2019  . Pain in right foot  03/30/2018  . Perennial and seasonal allergic rhinitis 11/09/2017  . History of food allergy 11/09/2017  . Wheezing 11/09/2017  . Dyspnea/wheezing 10/31/2017  . Cough 10/31/2017  . Bronchitis, mucopurulent recurrent (HCC) 09/07/2017  . SVT (supraventricular tachycardia) (HCC) 04/23/2015  . Abdominal pain, epigastric 06/03/2013  . Unspecified vitamin D deficiency 04/09/2013  . Esophageal reflux 04/09/2013  . Hepatitis B carrier (HCC) 04/09/2013  . Other and unspecified hyperlipidemia 04/09/2013  . Unspecified sleep apnea 04/09/2013    04/11/2013, PTA 12/04/2020, 6:29 PM  [facility] [address]  [address] [place], [place], [place] Phone: [phone]   Fax:  [phone]  Name: Richard Walker MRN: Parthenia Ames Date of Birth: October 17, 1955

## 2020-12-05 DIAGNOSIS — R42 Dizziness and giddiness: Secondary | ICD-10-CM | POA: Diagnosis not present

## 2020-12-05 LAB — TROPONIN I (HIGH SENSITIVITY): Troponin I (High Sensitivity): 4 ng/L (ref <18)

## 2020-12-05 MED ORDER — MECLIZINE HCL 25 MG PO TABS: 25.0000 mg | ORAL_TABLET | Freq: Three times a day (TID) | ORAL | 0 refills | Status: DC | PRN

## 2020-12-05 NOTE — ED Provider Notes (Signed)
[facility] EMERGENCY DEPARTMENT Provider Note   CSN: [id number] Arrival date & time: 12/04/20  2102     History Chief Complaint  Patient presents with  . Emesis  . Dizziness    Richard Walker is a 65 y.o. male.  65 year old male with extensive past medical history Including atrial fibrillation on anticoagulation, SVT, OSA, prediabetes, hyperlipidemia, GERD, vertigo who presents with nausea, vomiting, dizziness.  Patient has been fasting all day for Ramadan.  This evening he was preparing food and started not feeling well with nausea.  He went to lay down on the couch and began having dizziness which he describes as room spinning sensation.  He stayed there for a while and then tried to drink some water and eat a piece of chocolate thinking that his blood sugar may be low but then he had to lay back down because his symptoms persisted.  He started having some burning chest heaviness that moved up into his throat and bilateral ears.  Eventually they called EMS because of the persistence of his symptoms.  He had 1 episode of vomiting which actually seemed to improve his symptoms somewhat.  He continues to have some chest pressure into his ears but his nausea is slightly improved.  His dizziness is mainly when he sits up.  He has had vertigo before.  He reports some head pressure.  No diarrhea, fevers, or recent illness.  The history is provided by the patient.  Emesis Dizziness Associated symptoms: vomiting        Past Medical History:  Diagnosis Date  . DDD (degenerative disc disease), lumbar    L3/L4  . Diverticulosis   . GERD (gastroesophageal reflux disease)   . Hyperlipidemia   . Low sperm motility   . Pre-diabetes   . Sleep apnea    uses cpap   . SVT (supraventricular tachycardia) [facility])     Patient Active Problem List   Diagnosis Date Noted  . Neck pain 11/13/2020  . Vertigo 11/13/2020  . Hypertension 11/03/2020  . Contusion of bone 10/23/2020  .  Degenerative tear of left medial meniscus 10/23/2020  . Concussion with no loss of consciousness 10/23/2020  . Paroxysmal atrial fibrillation (HCC) 05/22/2019  . Unspecified atrial flutter (HCC) 05/22/2019  . Pain in right foot 03/30/2018  . Perennial and seasonal allergic rhinitis 11/09/2017  . History of food allergy 11/09/2017  . Wheezing 11/09/2017  . Dyspnea/wheezing 10/31/2017  . Cough 10/31/2017  . Bronchitis, mucopurulent recurrent (HCC) 09/07/2017  . SVT (supraventricular tachycardia) (HCC) 04/23/2015  . Abdominal pain, epigastric 06/03/2013  . Unspecified vitamin D deficiency 04/09/2013  . Esophageal reflux 04/09/2013  . Hepatitis B carrier (HCC) 04/09/2013  . Other and unspecified hyperlipidemia 04/09/2013  . Unspecified sleep apnea 04/09/2013    Past Surgical History:  Procedure Laterality Date  . 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 REPAIR OF LEFT INDEX AND LONG FINGER BY FUSION /  REPAIR OF RING FINGER;  Surgeon: Mack Hook, MD;  Location: [facility] OR;  Service: Orthopedics;  Laterality: Left;  . BACK SURGERY    . CARDIAC CATHETERIZATION N/A 12/22/2015   Procedure: Left Heart Cath and Coronary Angiography;  Surgeon: Peter M Swaziland, MD;  Location: [facility] INVASIVE CV LAB;  Service: Cardiovascular;  Laterality: N/A;  . CARDIAC ELECTROPHYSIOLOGY STUDY AND ABLATION    . ELECTROPHYSIOLOGIC STUDY N/A 02/19/2016   Procedure: SVT Ablation;  Surgeon: Marinus Maw, MD;  Location: [facility] INVASIVE CV LAB;  Service: Cardiovascular;  Laterality: N/A;  .  septal reconstruction  2017  . TURBINATE REDUCTION Bilateral 07/2016  . WISDOM TOOTH EXTRACTION         Family History  Problem Relation Age of Onset  . Hypertension Father   . Food Allergy Son   . Colon cancer Neg Hx   . Stomach cancer Neg Hx   . Allergic rhinitis Neg Hx   . Angioedema Neg Hx   . Asthma Neg Hx   . Eczema Neg Hx   . Immunodeficiency Neg Hx   . Urticaria Neg Hx     Social History   Tobacco Use  . Smoking  status: Former Smoker    Packs/day: 1.00    Years: 10.00    Pack years: 10.00    Types: Cigarettes    Quit date: 1992    Years since quitting: 30.3  . Smokeless tobacco: Never Used  . Tobacco comment: Uses Hookah Pipe occasionally  Vaping Use  . Vaping Use: Never used  Substance Use Topics  . Alcohol use: No  . Drug use: No    Home Medications Prior to Admission medications   Medication Sig Start Date End Date Taking? Authorizing Provider  acetaminophen (TYLENOL) 325 MG tablet Take 2 tablets (650 mg total) by mouth every 6 (six) hours. 07/15/18   Mack Hook, MD  albuterol [facility] HFA) [phone] Base) MCG/ACT inhaler Inhale 1-2 puffs into the lungs every 6 (six) hours as needed for wheezing or shortness of breath. 09/15/20   Coralyn Helling, MD  azelastine (ASTELIN) 0.1 % nasal spray 1-2 sprays per nostril 2 times daily as needed. 09/15/20   Coralyn Helling, MD  benzonatate (TESSALON) 100 MG capsule Take 1 capsule (100 mg total) by mouth 3 (three) times daily as needed for cough. 11/11/20   Saguier, Ramon Dredge, PA-C  budesonide-formoterol (SYMBICORT) 160-4.5 MCG/ACT inhaler Inhale 2 puffs into the lungs 2 (two) times daily. 09/15/20   Coralyn Helling, MD  cyclobenzaprine (FLEXERIL) 10 MG tablet Take 1 tablet (10 mg total) by mouth 3 (three) times daily as needed. 11/13/20   Myra Rude, MD  diclofenac (VOLTAREN) 75 MG EC tablet Take 1 tablet (75 mg total) by mouth 2 (two) times daily. 01/14/20   Felecia Shelling, DPM  ezetimibe (ZETIA) 10 MG tablet Take 1 tablet (10 mg total) by mouth daily. 07/25/18   Saguier, Ramon Dredge, PA-C  famotidine-calcium carbonate-magnesium hydroxide (PEPCID COMPLETE) 10-800-165 MG chewable tablet Chew by mouth.    [provider]  fenofibrate (TRICOR) 145 MG tablet Take 1 tablet by mouth daily. 01/10/19   [provider]  flecainide (TAMBOCOR) 150 MG tablet Take 1 tablet (150 mg total) by mouth 2 (two) times daily. 11/03/20   Marinus Maw, MD  fluticasone  (FLONASE) 50 MCG/ACT nasal spray Place 1 spray into both nostrils daily. 09/15/20   Coralyn Helling, MD  gabapentin (NEURONTIN) 100 MG capsule Take 1 capsule (100 mg total) by mouth 3 (three) times daily. 10/02/18   Felecia Shelling, DPM  ibuprofen (ADVIL) 200 MG tablet Take 3 tablets (600 mg total) by mouth every 6 (six) hours. 07/15/18   Mack Hook, MD  levocetirizine (XYZAL) 5 MG tablet TAKE 1 TABLET(5 MG) BY MOUTH EVERY EVENING 11/11/20   Saguier, Ramon Dredge, PA-C  Magnesium 250 MG TABS Take 1 tablet by mouth once a week.    [provider]  meclizine (ANTIVERT) 25 MG tablet Take 1 tablet (25 mg total) by mouth 2 (two) times daily as needed for dizziness. 11/13/20   Jordan Likes,  Marye RoundJeremy E, MD  meloxicam (MOBIC) 15 MG tablet Take 1 tablet (15 mg total) by mouth daily. 01/17/19   Felecia ShellingEvans, Brent M, DPM  metFORMIN (GLUCOPHAGE) 500 MG tablet Take 1 tablet (500 mg total) by mouth 2 (two) times daily with a meal. 06/30/18   Saguier, Ramon DredgeEdward, PA-C  metoprolol tartrate (LOPRESSOR) 25 MG tablet Take one tablet by mouth twice a day.  May take one additional tablet by mouth as needed for breakthrough palpitations. 11/03/20   Marinus Mawaylor, Gregg W, MD  montelukast (SINGULAIR) 10 MG tablet Take 1 tablet once at night for coughing or wheezing. 05/28/20   Coralyn HellingSood, Vineet, MD  Olopatadine HCl 0.2 % SOLN Apply 1 drop to eye daily as needed (allergies). 09/25/20   Coralyn HellingSood, Vineet, MD  pantoprazole (PROTONIX) 40 MG tablet Take 1 tablet (40 mg total) by mouth 2 (two) times daily. 08/17/18   Olive BassMurray, Laura Woodruff, FNP  PRESCRIPTION MEDICATION Inhale into the lungs at bedtime. CPAP    [provider]  rivaroxaban (XARELTO) 20 MG TABS tablet Take 1 tablet (20 mg total) by mouth daily with supper. 11/03/20   Marinus Mawaylor, Gregg W, MD  sildenafil (REVATIO) 20 MG tablet Take 1-2 tablets (20-40 mg total) by mouth as needed. 07/27/17   Marinus Mawaylor, Gregg W, MD  Vitamin D, Ergocalciferol, (DRISDOL) 1.25 MG (50000 UT) CAPS capsule Take 1 capsule (50,000  Units total) by mouth every 7 (seven) days. 07/25/18   Saguier, Ramon DredgeEdward, PA-C    Allergies    Tree extract, Pork-derived products, and Statins  Review of Systems   Review of Systems  Gastrointestinal: Positive for vomiting.  Neurological: Positive for dizziness.   All other systems reviewed and are negative except that which was mentioned in HPI  Physical Exam Updated Vital Signs BP (!) 141/80 (BP Location: Right Arm)   Pulse (!) 54   Temp 97.8 F (36.6 C) (Oral)   Resp 15   Ht 5\' 9"  (1.753 m)   Wt 118.8 kg   SpO2 98%   BMI 38.68 kg/m   Physical Exam Vitals and nursing note reviewed.  Constitutional:      General: He is not in acute distress.    Appearance: He is well-developed.     Comments: Awake, alert  HENT:     Head: Normocephalic and atraumatic.     Right Ear: Tympanic membrane and ear canal normal.     Left Ear: Tympanic membrane and ear canal normal.     Mouth/Throat:     Mouth: Mucous membranes are moist.     Pharynx: Oropharynx is clear.  Eyes:     Extraocular Movements: Extraocular movements intact.     Conjunctiva/sclera: Conjunctivae normal.     Pupils: Pupils are equal, round, and reactive to light.  Cardiovascular:     Rate and Rhythm: Normal rate and regular rhythm.     Heart sounds: Normal heart sounds. No murmur heard.   Pulmonary:     Effort: Pulmonary effort is normal. No respiratory distress.     Breath sounds: Normal breath sounds.  Abdominal:     General: Bowel sounds are normal. There is no distension.     Palpations: Abdomen is soft.     Tenderness: There is no abdominal tenderness.  Musculoskeletal:     Cervical back: Neck supple.  Skin:    General: Skin is warm and dry.  Neurological:     Mental Status: He is alert and oriented to person, place, and time.     Cranial Nerves:  No cranial nerve deficit.     Motor: No abnormal muscle tone.     Deep Tendon Reflexes: Reflexes are normal and symmetric.     Comments: Fluent speech,  normal finger-to-nose testing, negative pronator drift, no clonus 5/5 strength and normal sensation x all 4 extremities  Psychiatric:        Thought Content: Thought content normal.        Judgment: Judgment normal.     ED Results / Procedures / Treatments   Labs (all labs ordered are listed, but only abnormal results are displayed) Labs Reviewed  COMPREHENSIVE METABOLIC PANEL - Abnormal; Notable for the following components:      Result Value   Glucose, Bld 113 (*)    All other components within normal limits  CBC  LIPASE, BLOOD  TROPONIN I (HIGH SENSITIVITY)  TROPONIN I (HIGH SENSITIVITY)    EKG EKG Interpretation  Date/Time:  Thursday December 04 2020 21:15:48 EDT Ventricular Rate:  58 PR Interval:  198 QRS Duration: 105 QT Interval:  453 QTC Calculation: 445 R Axis:   15 Text Interpretation: Sinus rhythm Abnormal R-wave progression, early transition No significant change since last tracing Confirmed by Frederick Peers [phone]) on 12/04/2020 9:49:18 PM   Radiology No results found.  Procedures Procedures   Medications Ordered in ED Medications  ondansetron (ZOFRAN) injection 4 mg (4 mg Intravenous Given 12/04/20 2225)  meclizine (ANTIVERT) tablet 25 mg (25 mg Oral Given 12/04/20 2223)  meclizine (ANTIVERT) tablet 25 mg (25 mg Oral Given 12/04/20 2342)    ED Course  I have reviewed the triage vital signs and the nursing notes.  Pertinent labs & imaging results that were available during my care of the patient were reviewed by me and considered in my medical decision making (see chart for details).    MDM Rules/Calculators/A&P                          Pt alert, neuro intact on exam, stable VS. EKG showing sinus rhythm, rate 50s.  Basic lab work is unremarkable, initial troponin negative.  His description of chest pressure in the setting of vertiginous symptoms and head/ear pressure is very atypical for ACS.  His dizziness has been intermittent and he reports history  of vertigo, I doubt central process driving his symptoms tonight.  He has had moderate improvement with meclizine and Zofran, will give second dose of meclizine while awaiting second troponin.  I am signing the patient out to the oncoming provider who will follow up on second troponin and reassessment. Final Clinical Impression(s) / ED Diagnoses Final diagnoses:  None    Rx / DC Orders ED Discharge Orders    None       Mackensi Mahadeo, Ambrose Finland, MD 12/05/20 [phone]

## 2020-12-05 NOTE — Discharge Instructions (Addendum)
Begin taking meclizine as prescribed as needed for dizziness.  Follow-up with your primary doctor if symptoms or not improving in the next few days, and return to the ER if symptoms significantly worsen or change.

## 2020-12-05 NOTE — ED Provider Notes (Signed)
Care assumed from Dr. Clarene Duke at shift change.  Patient is awaiting results of a repeat troponin.  He presented here initially with symptoms most consistent with vertigo, but felt to require rule out of ACS.  Troponin has returned and is negative.  Patient has been reevaluated and is feeling significantly improved.  He is requesting to go home.  He will be discharged with meclizine and return as needed.   Geoffery Lyons, MD 12/05/20 0140

## 2020-12-08 ENCOUNTER — Other Ambulatory Visit: Payer: Self-pay

## 2020-12-08 ENCOUNTER — Ambulatory Visit: Payer: Medicare Other | Admitting: Physical Therapy

## 2020-12-08 ENCOUNTER — Encounter: Payer: Self-pay | Admitting: Physical Therapy

## 2020-12-08 DIAGNOSIS — M6281 Muscle weakness (generalized): Secondary | ICD-10-CM

## 2020-12-08 DIAGNOSIS — M5412 Radiculopathy, cervical region: Secondary | ICD-10-CM | POA: Diagnosis not present

## 2020-12-08 DIAGNOSIS — R42 Dizziness and giddiness: Secondary | ICD-10-CM

## 2020-12-08 DIAGNOSIS — G8929 Other chronic pain: Secondary | ICD-10-CM

## 2020-12-08 DIAGNOSIS — R262 Difficulty in walking, not elsewhere classified: Secondary | ICD-10-CM

## 2020-12-08 DIAGNOSIS — M25561 Pain in right knee: Secondary | ICD-10-CM

## 2020-12-08 DIAGNOSIS — M545 Low back pain, unspecified: Secondary | ICD-10-CM

## 2020-12-08 NOTE — Therapy (Signed)
[facility] [address]  [address] [place], [place], [address] Phone: [phone]   Fax:  [phone]  Physical Therapy Treatment  Patient Details  Name: Richard Walker MRN: [id number] Date of Birth: 1955/09/04 Referring Provider (PT): Clare Gandy, MD   Encounter Date: 12/08/2020   PT End of Session - 12/08/20 1601    Visit Number 8    Number of Visits 17    Date for PT Re-Evaluation 12/31/20    Authorization Type Medicare & Traditional Medicaid    PT Start Time 1526    PT Stop Time 1604    PT Time Calculation (min) 38 min    Activity Tolerance Patient tolerated treatment well    Behavior During Therapy [facility] for tasks assessed/performed           Past Medical History:  Diagnosis Date  . DDD (degenerative disc disease), lumbar    L3/L4  . Diverticulosis   . GERD (gastroesophageal reflux disease)   . Hyperlipidemia   . Low sperm motility   . Pre-diabetes   . Sleep apnea    uses cpap   . SVT (supraventricular tachycardia) (HCC)     Past Surgical History:  Procedure Laterality Date  . 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 REPAIR OF LEFT INDEX AND LONG FINGER BY FUSION /  REPAIR OF RING FINGER;  Surgeon: Mack Hook, MD;  Location: [facility] OR;  Service: Orthopedics;  Laterality: Left;  . BACK SURGERY    . CARDIAC CATHETERIZATION N/A 12/22/2015   Procedure: Left Heart Cath and Coronary Angiography;  Surgeon: Peter M Swaziland, MD;  Location: [facility] INVASIVE CV LAB;  Service: Cardiovascular;  Laterality: N/A;  . CARDIAC ELECTROPHYSIOLOGY STUDY AND ABLATION    . ELECTROPHYSIOLOGIC STUDY N/A 02/19/2016   Procedure: SVT Ablation;  Surgeon: Marinus Maw, MD;  Location: [facility] INVASIVE CV LAB;  Service: Cardiovascular;  Laterality: N/A;  . septal reconstruction  2017  . TURBINATE REDUCTION Bilateral 07/2016  . WISDOM TOOTH EXTRACTION      There were no vitals filed for this visit.   Subjective Assessment - 12/08/20 1527     Subjective Reports that while he was fasting on thursday. Had done his vestibular HEP 30 minutes before and had a normal amount of dizziness. Later on he laid on the couch and felt the room spinning. He began sweating and the sensation got worse, and he started vomitting. Was taken to the ED but everything was clear. Reports that on Friday he felt head pressure and had a HA, but is feeling better now.    Pertinent History none    Diagnostic tests Per MD- "Independent review of the CT lumbar spine from 2/15 shows stable interbody fusion at L4/5 and degenerative disc disease at L5-S1.    Independent review of the CT cervical spine 2/15 shows severe degenerative change at C5-6.    Independent review the B knee x-ray from 2/15 shows no acute changes."    Patient Stated Goals decrease pain and dizziness    Currently in Pain? No/denies                   Vestibular Assessment - 12/08/20 0001      Dix-Hallpike Right   Dix-Hallpike Right Duration 15 sec    Dix-Hallpike Right Symptoms Upbeat, right rotatory nystagmus   x2     Sidelying Right   Sidelying Right Duration 10-15 sec    Sidelying Right Symptoms Upbeat, right rotatory  nystagmus   latent onset     Sidelying Left   Sidelying Left Duration 0    Sidelying Left Symptoms No nystagmus   c/o lightheadedness and head pressure; did not resolve with time                    Vestibular Treatment/Exercise - 12/08/20 0001       EPLEY MANUEVER RIGHT   Number of Reps  2    Overall Response Improved Symptoms    Response Details  c/o dizziness upon sitting which resolved in ~30 sec after first trial; improved symptoms on 2nd trial                 PT Education - 12/08/20 1559    Education Details edu on risk factors of BPPV, edu on post-epley instructions    Person(s) Educated Patient    Methods Explanation;Demonstration;Tactile cues;Verbal cues;Handout    Comprehension Verbalized understanding;Returned demonstration             PT Short Term Goals - 12/04/20 1706      PT SHORT TERM GOAL #1   Title Patient to be independent with initial HEP.    Time 3    Period Weeks    Status Achieved    Target Date 11/26/20             PT Long Term Goals - 11/24/20 1535      PT LONG TERM GOAL #1   Title Patient to be independent with advanced HEP.    Time 8    Period Weeks    Status On-going      PT LONG TERM GOAL #2   Title Patient to demonstrate L UE strength >/=4+/5.    Time 8    Period Weeks    Status On-going      PT LONG TERM GOAL #3   Title Patient to demonstrate B LE strength >/=4+/5.    Time 8    Period Weeks    Status On-going      PT LONG TERM GOAL #4   Title Patient to demonstrate cervical AROM WFL and without pain limiting.    Time 8    Period Weeks    Status On-going      PT LONG TERM GOAL #5   Title Patient to report tolerance for kneeling to pray d/t improvement in pain levels.    Time 8    Period Weeks    Status On-going      PT LONG TERM GOAL #6   Title Patient to report tolerance for 2 hours on his feet without pain limiting.    Time 8    Period Weeks    Status On-going      PT LONG TERM GOAL #7   Title Patient to report 90% improvement in dizziness.    Time 8    Period Weeks    Status On-going                 Plan - 12/08/20 1603    Clinical Impression Statement Patient arrived to session with report of ED visit on Thursday for vertigo. Reports having done his vestibular HEP 30 minutes prior to this episode without much issue. Notes that he had sensation of spinning and emesis. ED workup for negative for cardiac and central causes. Patient with c/o dizziness and R up beating torsional nystagmus with R sidelying test and R DH today. Treated patient with R epley maneuver  x2  which was tolerated well. Patient was educated on post-epley instructions as well as risk factors of BPPV. Instructed patient to continue back with Austin Miles tomorrow to assess if  dizziness returns. Patient reported understanding and without complaints at end of session.    Personal Factors and Comorbidities Age;Comorbidity 1;Fitness;Past/Current Experience;Time since onset of injury/illness/exacerbation    Comorbidities back surgery L3-4 1997    PT Frequency 2x / week    PT Duration 8 weeks    PT Treatment/Interventions ADLs/Self Care Home Management;Canalith Repostioning;Cryotherapy;Electrical Stimulation;Iontophoresis 4mg /ml Dexamethasone;Moist Heat;Traction;Balance training;Therapeutic exercise;Therapeutic activities;Functional mobility training;Stair training;Gait training;Ultrasound;Neuromuscular re-education;Patient/family education;Manual techniques;Vestibular;Vasopneumatic Device;Taping;Energy conservation;Dry needling;Passive range of motion;Scar mobilization    PT Next Visit Plan progress ROM and strength; reassess R DH/sidelying test, progress VOR training    Consulted and Agree with Plan of Care Patient           Patient will benefit from skilled therapeutic intervention in order to improve the following deficits and impairments:  Abnormal gait,Hypomobility,Decreased activity tolerance,Decreased strength,Increased fascial restricitons,Impaired UE functional use,Pain,Difficulty walking,Decreased balance,Increased muscle spasms,Improper body mechanics,Dizziness,Decreased range of motion,Impaired flexibility,Postural dysfunction  Visit Diagnosis: Radiculopathy, cervical region  Muscle weakness (generalized)  Chronic pain of left knee  Acute pain of right knee  Bilateral low back pain, unspecified chronicity, unspecified whether sciatica present  Difficulty in walking, not elsewhere classified  Dizziness and giddiness     Problem List Patient Active Problem List   Diagnosis Date Noted  . Neck pain 11/13/2020  . Vertigo 11/13/2020  . Hypertension 11/03/2020  . Contusion of bone 10/23/2020  . Degenerative tear of left medial meniscus 10/23/2020   . Concussion with no loss of consciousness 10/23/2020  . Paroxysmal atrial fibrillation (HCC) 05/22/2019  . Unspecified atrial flutter (HCC) 05/22/2019  . Pain in right foot 03/30/2018  . Perennial and seasonal allergic rhinitis 11/09/2017  . History of food allergy 11/09/2017  . Wheezing 11/09/2017  . Dyspnea/wheezing 10/31/2017  . Cough 10/31/2017  . Bronchitis, mucopurulent recurrent (HCC) 09/07/2017  . SVT (supraventricular tachycardia) (HCC) 04/23/2015  . Abdominal pain, epigastric 06/03/2013  . Unspecified vitamin D deficiency 04/09/2013  . Esophageal reflux 04/09/2013  . Hepatitis B carrier (HCC) 04/09/2013  . Other and unspecified hyperlipidemia 04/09/2013  . Unspecified sleep apnea 04/09/2013    04/11/2013, PT, DPT 12/08/20 4:09 PM    [facility] [address]  [address] [place], [place], [place] Phone: [phone]   Fax:  [phone]  Name: Richard Walker MRN: Parthenia Ames Date of Birth: 02-27-1956

## 2020-12-11 ENCOUNTER — Other Ambulatory Visit: Payer: Self-pay

## 2020-12-11 ENCOUNTER — Encounter: Payer: Self-pay | Admitting: Physical Therapy

## 2020-12-11 ENCOUNTER — Ambulatory Visit: Payer: Medicare Other | Admitting: Physical Therapy

## 2020-12-11 DIAGNOSIS — M545 Low back pain, unspecified: Secondary | ICD-10-CM

## 2020-12-11 DIAGNOSIS — G8929 Other chronic pain: Secondary | ICD-10-CM

## 2020-12-11 DIAGNOSIS — M5412 Radiculopathy, cervical region: Secondary | ICD-10-CM | POA: Diagnosis not present

## 2020-12-11 DIAGNOSIS — R262 Difficulty in walking, not elsewhere classified: Secondary | ICD-10-CM

## 2020-12-11 DIAGNOSIS — M25561 Pain in right knee: Secondary | ICD-10-CM

## 2020-12-11 DIAGNOSIS — M6281 Muscle weakness (generalized): Secondary | ICD-10-CM

## 2020-12-11 DIAGNOSIS — R42 Dizziness and giddiness: Secondary | ICD-10-CM

## 2020-12-11 NOTE — Therapy (Signed)
[facility] [address]  [address] [place], [place], [address] Phone: [phone]   Fax:  [phone]  Physical Therapy Treatment  Patient Details  Name: Richard Walker MRN: [id number] Date of Birth: 05-Jul-1956 Referring Provider (PT): Clare Gandy, MD   Encounter Date: 12/11/2020   PT End of Session - 12/11/20 1614    Visit Number 9    Number of Visits 17    Date for PT Re-Evaluation 12/31/20    Authorization Type Medicare & Traditional Medicaid    PT Start Time 1531    PT Stop Time 1621    PT Time Calculation (min) 50 min    Activity Tolerance Patient tolerated treatment well    Behavior During Therapy [facility] for tasks assessed/performed           Past Medical History:  Diagnosis Date  . DDD (degenerative disc disease), lumbar    L3/L4  . Diverticulosis   . GERD (gastroesophageal reflux disease)   . Hyperlipidemia   . Low sperm motility   . Pre-diabetes   . Sleep apnea    uses cpap   . SVT (supraventricular tachycardia) (HCC)     Past Surgical History:  Procedure Laterality Date  . 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 REPAIR OF LEFT INDEX AND LONG FINGER BY FUSION /  REPAIR OF RING FINGER;  Surgeon: Mack Hook, MD;  Location: [facility] OR;  Service: Orthopedics;  Laterality: Left;  . BACK SURGERY    . CARDIAC CATHETERIZATION N/A 12/22/2015   Procedure: Left Heart Cath and Coronary Angiography;  Surgeon: Peter M Swaziland, MD;  Location: [facility] INVASIVE CV LAB;  Service: Cardiovascular;  Laterality: N/A;  . CARDIAC ELECTROPHYSIOLOGY STUDY AND ABLATION    . ELECTROPHYSIOLOGIC STUDY N/A 02/19/2016   Procedure: SVT Ablation;  Surgeon: Marinus Maw, MD;  Location: [facility] INVASIVE CV LAB;  Service: Cardiovascular;  Laterality: N/A;  . septal reconstruction  2017  . TURBINATE REDUCTION Bilateral 07/2016  . WISDOM TOOTH EXTRACTION      There were no vitals filed for this visit.   Subjective Assessment - 12/11/20 1531     Subjective Dizziness is better, but not gone. Notes that if he turns his head to the L slowly, he is not as dizzy as before. Back pain has also been better recently.    Pertinent History none    Diagnostic tests Per MD- "Independent review of the CT lumbar spine from 2/15 shows stable interbody fusion at L4/5 and degenerative disc disease at L5-S1.    Independent review of the CT cervical spine 2/15 shows severe degenerative change at C5-6.    Independent review the B knee x-ray from 2/15 shows no acute changes."    Patient Stated Goals decrease pain and dizziness    Currently in Pain? Yes    Pain Score 6     Pain Location Neck    Pain Orientation Posterior    Pain Descriptors / Indicators Aching    Pain Type Acute pain    Pain Score 6    Pain Location Knee    Pain Orientation Left;Right;Anterior    Pain Descriptors / Indicators Aching    Pain Type Acute pain                   Vestibular Assessment - 12/11/20 0001      Dix-Hallpike Right   Dix-Hallpike Right Duration 15 sec   2nd trial- <5 sec   Dix-Hallpike Right  Symptoms Upbeat, right rotatory nystagmus                    [facility] Adult PT Treatment/Exercise - 12/11/20 0001      Moist Heat Therapy   Number Minutes Moist Heat 10 Minutes    Moist Heat Location Cervical   L     Manual Therapy   Manual Therapy Soft tissue mobilization;Myofascial release    Manual therapy comments sitting    Soft tissue mobilization STM to B cervical paraspinals, suboccipitals, UT, LS, scalenes    Myofascial Release manual TPR to B cervical paraspinals, suboccipitals, UT, LS, scalenes   very tight and TTP L>R; unable to tolerate pressure over L LS          Vestibular Treatment/Exercise - 12/11/20 0001       EPLEY MANUEVER RIGHT   Number of Reps  2   2nd trial included 30 sec of vibration to R mastoid during position 1 and 3   Overall Response Improved Symptoms    Response Details  c/o dizziness upon sitting which resolved  quickly                 PT Education - 12/11/20 1627    Education Details use of diagram to educate patient on anatomy of the inner ear for better understanding of BPPV    Person(s) Educated Patient    Methods Explanation;Demonstration;Tactile cues;Verbal cues    Comprehension Verbalized understanding            PT Short Term Goals - 12/04/20 1706      PT SHORT TERM GOAL #1   Title Patient to be independent with initial HEP.    Time 3    Period Weeks    Status Achieved    Target Date 11/26/20             PT Long Term Goals - 11/24/20 1535      PT LONG TERM GOAL #1   Title Patient to be independent with advanced HEP.    Time 8    Period Weeks    Status On-going      PT LONG TERM GOAL #2   Title Patient to demonstrate L UE strength >/=4+/5.    Time 8    Period Weeks    Status On-going      PT LONG TERM GOAL #3   Title Patient to demonstrate B LE strength >/=4+/5.    Time 8    Period Weeks    Status On-going      PT LONG TERM GOAL #4   Title Patient to demonstrate cervical AROM WFL and without pain limiting.    Time 8    Period Weeks    Status On-going      PT LONG TERM GOAL #5   Title Patient to report tolerance for kneeling to pray d/t improvement in pain levels.    Time 8    Period Weeks    Status On-going      PT LONG TERM GOAL #6   Title Patient to report tolerance for 2 hours on his feet without pain limiting.    Time 8    Period Weeks    Status On-going      PT LONG TERM GOAL #7   Title Patient to report 90% improvement in dizziness.    Time 8    Period Weeks    Status On-going  Plan - 12/11/20 1614    Clinical Impression Statement Patient arrived to session with report of improved dizziness, as he is able to turn his head to the L slowly with less dizziness than before. Also remarks on decreased LBP recently. R DH was positive for R posterior canal BPPV at start , thus treated with R Epley maneuver. 2nd trial  of Epley was still slightly positive for BPPV, thus 2nd trial of Epley included vibration over the R mastoid. Patient still with report of dizziness upon sitting from repositioning, which quickly resolved. Patient made aware of post-Epley precautions and heavily reminded patient to avoid large head movements after repositioning as patient frequently forgetting. Ended session with STM to cervical musculature d/t patient's c/o neck pain and head pressure. Patient very tight and TTP L>R cervical spine and unable to tolerate pressure over L LS d/t pain. Ended session with moist heat to L cervical spine for pain and tension relief. Patient reported feeling "much better" at end of session.    Personal Factors and Comorbidities Age;Comorbidity 1;Fitness;Past/Current Experience;Time since onset of injury/illness/exacerbation    Comorbidities back surgery L3-4 1997    PT Frequency 2x / week    PT Duration 8 weeks    PT Treatment/Interventions ADLs/Self Care Home Management;Canalith Repostioning;Cryotherapy;Electrical Stimulation;Iontophoresis 4mg /ml Dexamethasone;Moist Heat;Traction;Balance training;Therapeutic exercise;Therapeutic activities;Functional mobility training;Stair training;Gait training;Ultrasound;Neuromuscular re-education;Patient/family education;Manual techniques;Vestibular;Vasopneumatic Device;Taping;Energy conservation;Dry needling;Passive range of motion;Scar mobilization    PT Next Visit Plan progress ROM and strength; reassess R DH/sidelying test, progress VOR training    Consulted and Agree with Plan of Care Patient           Patient will benefit from skilled therapeutic intervention in order to improve the following deficits and impairments:  Abnormal gait,Hypomobility,Decreased activity tolerance,Decreased strength,Increased fascial restricitons,Impaired UE functional use,Pain,Difficulty walking,Decreased balance,Increased muscle spasms,Improper body mechanics,Dizziness,Decreased range of  motion,Impaired flexibility,Postural dysfunction  Visit Diagnosis: Radiculopathy, cervical region  Muscle weakness (generalized)  Chronic pain of left knee  Acute pain of right knee  Bilateral low back pain, unspecified chronicity, unspecified whether sciatica present  Difficulty in walking, not elsewhere classified  Dizziness and giddiness     Problem List Patient Active Problem List   Diagnosis Date Noted  . Neck pain 11/13/2020  . Vertigo 11/13/2020  . Hypertension 11/03/2020  . Contusion of bone 10/23/2020  . Degenerative tear of left medial meniscus 10/23/2020  . Concussion with no loss of consciousness 10/23/2020  . Paroxysmal atrial fibrillation (HCC) 05/22/2019  . Unspecified atrial flutter (HCC) 05/22/2019  . Pain in right foot 03/30/2018  . Perennial and seasonal allergic rhinitis 11/09/2017  . History of food allergy 11/09/2017  . Wheezing 11/09/2017  . Dyspnea/wheezing 10/31/2017  . Cough 10/31/2017  . Bronchitis, mucopurulent recurrent (HCC) 09/07/2017  . SVT (supraventricular tachycardia) (HCC) 04/23/2015  . Abdominal pain, epigastric 06/03/2013  . Unspecified vitamin D deficiency 04/09/2013  . Esophageal reflux 04/09/2013  . Hepatitis B carrier (HCC) 04/09/2013  . Other and unspecified hyperlipidemia 04/09/2013  . Unspecified sleep apnea 04/09/2013     04/11/2013, PT, DPT 12/11/20 4:29 PM   [facility] [address]  [address] [place], [place], [place] Phone: [phone]   Fax:  [phone]  Name: Richard Walker MRN: Parthenia Ames Date of Birth: 1956-05-21

## 2020-12-18 ENCOUNTER — Encounter: Payer: Self-pay | Admitting: Physical Therapy

## 2020-12-18 ENCOUNTER — Ambulatory Visit: Payer: Medicare Other | Admitting: Physical Therapy

## 2020-12-18 ENCOUNTER — Other Ambulatory Visit: Payer: Self-pay

## 2020-12-18 DIAGNOSIS — R262 Difficulty in walking, not elsewhere classified: Secondary | ICD-10-CM

## 2020-12-18 DIAGNOSIS — M5412 Radiculopathy, cervical region: Secondary | ICD-10-CM

## 2020-12-18 DIAGNOSIS — M25561 Pain in right knee: Secondary | ICD-10-CM

## 2020-12-18 DIAGNOSIS — M545 Low back pain, unspecified: Secondary | ICD-10-CM

## 2020-12-18 DIAGNOSIS — G8929 Other chronic pain: Secondary | ICD-10-CM

## 2020-12-18 DIAGNOSIS — M25562 Pain in left knee: Secondary | ICD-10-CM

## 2020-12-18 DIAGNOSIS — M6281 Muscle weakness (generalized): Secondary | ICD-10-CM

## 2020-12-18 DIAGNOSIS — R42 Dizziness and giddiness: Secondary | ICD-10-CM

## 2020-12-18 NOTE — Therapy (Signed)
[facility] [place] [address]  [place], [place], [address] Phone: [phone]   Fax:  [phone]  Physical Therapy Progress Note  Patient Details  Name: Richard Walker MRN: [id number] Date of Birth: June 22, 1956 Referring Provider (PT): Clearance Coots, MD   Progress Note Reporting Period 11/05/20 to 12/18/20  See note below for Objective Data and Assessment of Progress/Goals.     Encounter Date: 12/18/2020   PT End of Session - 12/18/20 1747    Visit Number 10    Number of Visits 26    Date for PT Re-Evaluation 02/12/21    Authorization Type Medicare & Traditional Medicaid    PT Start Time 1701    PT Stop Time 1742    PT Time Calculation (min) 41 min    Activity Tolerance Patient tolerated treatment well;Patient limited by pain    Behavior During Therapy [facility] for tasks assessed/performed           Past Medical History:  Diagnosis Date  . DDD (degenerative disc disease), lumbar    L3/L4  . Diverticulosis   . GERD (gastroesophageal reflux disease)   . Hyperlipidemia   . Low sperm motility   . Pre-diabetes   . Sleep apnea    uses cpap   . SVT (supraventricular tachycardia) (HCC)     Past Surgical History:  Procedure Laterality Date  . 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 REPAIR OF LEFT INDEX AND LONG FINGER BY FUSION /  REPAIR OF RING FINGER;  Surgeon: Milly Jakob, MD;  Location: [place];  Service: Orthopedics;  Laterality: Left;  . BACK SURGERY    . CARDIAC CATHETERIZATION N/A 12/22/2015   Procedure: Left Heart Cath and Coronary Angiography;  Surgeon: Peter M Martinique, MD;  Location: [place] CV LAB;  Service: Cardiovascular;  Laterality: N/A;  . CARDIAC ELECTROPHYSIOLOGY STUDY AND ABLATION    . ELECTROPHYSIOLOGIC STUDY N/A 02/19/2016   Procedure: SVT Ablation;  Surgeon: Evans Lance, MD;  Location: [place] CV LAB;  Service: Cardiovascular;  Laterality: N/A;  . septal reconstruction  2017  .  TURBINATE REDUCTION Bilateral 07/2016  . WISDOM TOOTH EXTRACTION      There were no vitals filed for this visit.   Subjective Assessment - 12/18/20 1703    Subjective Reports that dizziness is definitely better. Only certain positions bother it- laying on the R especially when turning/rotating quickly to the R. Feels that the dizzinessness exercise is helping.    Pertinent History none    Diagnostic tests Per MD- "Independent review of the CT lumbar spine from 2/15 shows stable interbody fusion at L4/5 and degenerative disc disease at L5-S1.    Independent review of the CT cervical spine 2/15 shows severe degenerative change at C5-6.    Independent review the B knee x-ray from 2/15 shows no acute changes."    Patient Stated Goals decrease pain and dizziness    Pain Score 6     Pain Location Neck    Pain Orientation Posterior    Pain Descriptors / Indicators Aching    Pain Type Acute pain    Pain Score 8    Pain Location Knee    Pain Orientation Right;Left;Anterior    Pain Descriptors / Indicators Aching    Pain Type Acute pain    Pain Score 5    Pain Location Back    Pain Orientation Right;Left    Pain Descriptors / Indicators Aching    Pain  Type Acute pain              [facility] PT Assessment - 12/18/20 1715      Assessment   Medical Diagnosis Contusion of bone, degenerative tear of L medial meniscus, concussion without LOC    Referring Provider (PT) Clearance Coots, MD    Onset Date/Surgical Date 10/05/20      AROM   Cervical Flexion 55   7/10 pain in all planes   Cervical Extension 30    Cervical - Right Side Bend 35    Cervical - Left Side Bend 30    Cervical - Right Rotation 40    Cervical - Left Rotation 45      Strength   Left Shoulder Flexion 4+/5    Left Shoulder ABduction 4+/5    Left Shoulder Internal Rotation 4+/5    Left Shoulder External Rotation 4/5    Left Elbow Flexion 4+/5    Left Elbow Extension 4+/5    Left Wrist Flexion 4/5    Left Wrist  Extension 4/5    Left Hand Grip (lbs) 34.33   35, 35, 33   Right Hip Flexion 5/5    Right Hip ABduction 4+/5    Right Hip ADduction 4/5    Left Hip Flexion 5/5    Left Hip ABduction 4/5    Left Hip ADduction 4/5    Right Knee Flexion 4/5    Right Knee Extension 4+/5    Left Knee Flexion 4/5    Left Knee Extension 4/5                         [facility] Adult PT Treatment/Exercise - 12/18/20 0001      Modalities   Modalities Iontophoresis      Iontophoresis   Type of Iontophoresis Dexamethasone    Location B medial knees   1 patch on each   Dose 61m*minutes, 1.0 ml    Time 4 hour patch                  PT Education - 12/18/20 1746    Education Details discussion on objective progress and remaining impairments; edu on ionto benefits, wear time, safety, and removal    Person(s) Educated Patient    Methods Explanation;Demonstration;Verbal cues;Tactile cues;Handout    Comprehension Verbalized understanding            PT Short Term Goals - 12/18/20 1707      PT SHORT TERM GOAL #1   Title Patient to be independent with initial HEP.    Time 3    Period Weeks    Status Achieved    Target Date 11/26/20             PT Long Term Goals - 12/18/20 1707      PT LONG TERM GOAL #1   Title Patient to be independent with advanced HEP.    Time 8    Period Weeks    Status Partially Met   met for current   Target Date 02/12/21      PT LONG TERM GOAL #2   Title Patient to demonstrate L UE strength >/=4+/5.    Time 8    Period Weeks    Status Partially Met   improved in all planes of L shoulder, in elbow flexion/extension, L wrist extension, and grip strength.   Target Date 02/12/21      PT LONG TERM GOAL #3   Title  Patient to demonstrate B LE strength >/=4+/5.    Time 8    Period Weeks    Status Partially Met   improvement in B hip flexion, abduction, adduction and L knee flexion/extension   Target Date 02/12/21      PT LONG TERM GOAL #4   Title  Patient to demonstrate cervical AROM WFL and without pain limiting.    Time 8    Period Weeks    Status On-going   improved in flexion, extension, and R sidebending   Target Date 02/12/21      PT LONG TERM GOAL #5   Title Patient to report tolerance for kneeling to pray d/t improvement in pain levels.    Time 8    Period Weeks    Status On-going   attempted modifying this activity with addition of 2 folded pillows under knee- helps the R knee but still unable to tolerate on L knee   Target Date 02/12/21      PT LONG TERM GOAL #6   Title Patient to report tolerance for 2 hours on his feet without pain limiting.    Time 8    Period Weeks    Status On-going   5-6 minutes   Target Date 02/12/21      PT LONG TERM GOAL #7   Title Patient to report 90% improvement in dizziness.    Time 8    Period Weeks    Status Partially Met   70% improvement   Target Date 02/12/21                 Plan - 12/18/20 1748    Clinical Impression Statement Patient reporting 70% improvement in dizziness since initial eval. Now notes dizziness only when laying on the R or quickly turning/rotating to that side. Notes that he is still limited to 5-6 minutes on his feet before onset of pain in B knees. Also notes that he is still unable to kneel to pray d/t knee pain- attempted to modify this activity today but still limited by pain. Cervical AROM has improved in flexion, extension, and R sidebending but still shows limitation in all directions. L UE strength has improved in all planes of L shoulder, in elbow flexion/extension, L wrist extension, and grip strength. LE strength testing revealed improvement in B hip flexion, abduction, adduction and L knee flexion/extension. Weakness still remains in LEs. D/t severity of B knee pain, educated patient on ionto patch benefits and applied patch to B medial knees at end of session. Patient reported understanding of ionto wear time, precautions, removal, etc. No  complaint at end of session. Patient is demonstrating good progress towards goals. Would benefit from additional skilled PT services 2x/week for 8 weeks to address remaining goals.    Personal Factors and Comorbidities Age;Comorbidity 1;Fitness;Past/Current Experience;Time since onset of injury/illness/exacerbation    Comorbidities back surgery L3-4 1997    PT Frequency 2x / week    PT Duration 8 weeks    PT Treatment/Interventions ADLs/Self Care Home Management;Canalith Repostioning;Cryotherapy;Electrical Stimulation;Iontophoresis 60m/ml Dexamethasone;Moist Heat;Traction;Balance training;Therapeutic exercise;Therapeutic activities;Functional mobility training;Stair training;Gait training;Ultrasound;Neuromuscular re-education;Patient/family education;Manual techniques;Vestibular;Vasopneumatic Device;Taping;Energy conservation;Dry needling;Passive range of motion;Scar mobilization    PT Next Visit Plan progress ROM and strength; reassess R DH/sidelying test, progress VOR training    Consulted and Agree with Plan of Care Patient           Patient will benefit from skilled therapeutic intervention in order to improve the following deficits and impairments:  Abnormal gait,Hypomobility,Decreased  activity tolerance,Decreased strength,Increased fascial restricitons,Impaired UE functional use,Pain,Difficulty walking,Decreased balance,Increased muscle spasms,Improper body mechanics,Dizziness,Decreased range of motion,Impaired flexibility,Postural dysfunction  Visit Diagnosis: Radiculopathy, cervical region  Muscle weakness (generalized)  Chronic pain of left knee  Acute pain of right knee  Bilateral low back pain, unspecified chronicity, unspecified whether sciatica present  Difficulty in walking, not elsewhere classified  Dizziness and giddiness     Problem List Patient Active Problem List   Diagnosis Date Noted  . Neck pain 11/13/2020  . Vertigo 11/13/2020  . Hypertension 11/03/2020   . Contusion of bone 10/23/2020  . Degenerative tear of left medial meniscus 10/23/2020  . Concussion with no loss of consciousness 10/23/2020  . Paroxysmal atrial fibrillation ([place]) 05/22/2019  . Unspecified atrial flutter ([place]) 05/22/2019  . Pain in right foot 03/30/2018  . Perennial and seasonal allergic rhinitis 11/09/2017  . History of food allergy 11/09/2017  . Wheezing 11/09/2017  . Dyspnea/wheezing 10/31/2017  . Cough 10/31/2017  . Bronchitis, mucopurulent recurrent ([place]) 09/07/2017  . SVT (supraventricular tachycardia) ([place]) 04/23/2015  . Abdominal pain, epigastric 06/03/2013  . Unspecified vitamin D deficiency 04/09/2013  . Esophageal reflux 04/09/2013  . Hepatitis B carrier ([place]) 04/09/2013  . Other and unspecified hyperlipidemia 04/09/2013  . Unspecified sleep apnea 04/09/2013     Janene Harvey, PT, DPT 12/18/20 5:55 PM   [facility] [address]  [place], [place], [address] Phone: [phone]   Fax:  [phone]  Name: Richard Walker MRN: [id number] Date of Birth: September 01, 1955

## 2020-12-18 NOTE — Patient Instructions (Signed)
    IONTOPHORESIS PATIENT PRECAUTIONS & CONTRAINDICATIONS:  Redness under one or both electrodes can occur.  This characterized by a uniform redness that usually disappears within 12 hours of treatment. Small pinhead size blisters may result in response to the drug.  Contact your physician if the problem persists more than 24 hours. On rare occasions, iontophoresis therapy can result in temporary skin reactions such as rash, inflammation, irritation or burns.  The skin reactions may be the result of individual sensitivity to the ionic solution used, the condition of the skin at the start of treatment, reaction to the materials in the electrodes, allergies or sensitivity to dexamethasone, or a poor connection between the patch and your skin.  Discontinue using iontophoresis if you have any of these reactions and report to your therapist. Remove the Patch or electrodes if you have any undue sensation of pain or burning during the treatment and report discomfort to your therapist. Tell your Therapist if you have had known adverse reactions to the application of electrical current. Approximate treatment time is 4-6 hours.  Remove the patch after 6 hours. The Patch can be worn during normal activity, however excessive motion where the electrodes have been placed can cause poor contact between the skin and the electrode or uneven electrical current resulting in greater risk of skin irritation. Keep out of the reach of children.   DO NOT use if you have a cardiac pacemaker or any other electrically sensitive implanted device. DO NOT use if you have a known sensitivity to dexamethasone. DO NOT use during Magnetic Resonance Imaging (MRI). DO NOT use over broken or compromised skin (e.g. sunburn, cuts, or acne) due to the increased risk of skin reaction. DO NOT SHAVE over the area to be treated:  To establish good contact between the Patch and the skin, excessive hair may be clipped. DO NOT place the  Patch or electrodes on or over your eyes, directly over your heart, or brain. DO NOT reuse the Patch or electrodes as this may cause burns to occur.   For questions, please contact your therapist at:   [facility] [place] [address]  [address] [place], [place], [address] Phone: [phone]   Fax:  [phone]  

## 2020-12-21 ENCOUNTER — Other Ambulatory Visit: Payer: Self-pay | Admitting: Pulmonary Disease

## 2020-12-21 DIAGNOSIS — R0609 Other forms of dyspnea: Secondary | ICD-10-CM

## 2020-12-21 DIAGNOSIS — R062 Wheezing: Secondary | ICD-10-CM

## 2020-12-21 DIAGNOSIS — J3089 Other allergic rhinitis: Secondary | ICD-10-CM

## 2020-12-23 ENCOUNTER — Other Ambulatory Visit: Payer: Self-pay

## 2020-12-23 ENCOUNTER — Encounter: Payer: Self-pay | Admitting: Physical Therapy

## 2020-12-23 ENCOUNTER — Ambulatory Visit: Payer: Medicare Other | Attending: Family Medicine | Admitting: Physical Therapy

## 2020-12-23 DIAGNOSIS — M545 Low back pain, unspecified: Secondary | ICD-10-CM | POA: Diagnosis present

## 2020-12-23 DIAGNOSIS — M6281 Muscle weakness (generalized): Secondary | ICD-10-CM | POA: Insufficient documentation

## 2020-12-23 DIAGNOSIS — R262 Difficulty in walking, not elsewhere classified: Secondary | ICD-10-CM | POA: Insufficient documentation

## 2020-12-23 DIAGNOSIS — R42 Dizziness and giddiness: Secondary | ICD-10-CM | POA: Diagnosis present

## 2020-12-23 DIAGNOSIS — M25562 Pain in left knee: Secondary | ICD-10-CM | POA: Insufficient documentation

## 2020-12-23 DIAGNOSIS — M5412 Radiculopathy, cervical region: Secondary | ICD-10-CM | POA: Diagnosis present

## 2020-12-23 DIAGNOSIS — M25561 Pain in right knee: Secondary | ICD-10-CM | POA: Diagnosis present

## 2020-12-23 DIAGNOSIS — G8929 Other chronic pain: Secondary | ICD-10-CM | POA: Insufficient documentation

## 2020-12-23 NOTE — Therapy (Signed)
[facility] [address]  [place], [place], [address] Phone: [phone]   Fax:  [phone]  Physical Therapy Treatment  Patient Details  Name: Richard Walker MRN: [id number] Date of Birth: 10/11/1955 Referring Provider (PT): Clearance Coots, MD   Encounter Date: 12/23/2020   PT End of Session - 12/23/20 1744    Visit Number 11    Number of Visits 26    Date for PT Re-Evaluation 02/12/21    Authorization Type Medicare & Traditional Medicaid    PT Start Time 1701    PT Stop Time 4825    PT Time Calculation (min) 54 min    Activity Tolerance Patient tolerated treatment well;Patient limited by pain    Behavior During Therapy [facility] for tasks assessed/performed           Past Medical History:  Diagnosis Date  . DDD (degenerative disc disease), lumbar    L3/L4  . Diverticulosis   . GERD (gastroesophageal reflux disease)   . Hyperlipidemia   . Low sperm motility   . Pre-diabetes   . Sleep apnea    uses cpap   . SVT (supraventricular tachycardia) (HCC)     Past Surgical History:  Procedure Laterality Date  . 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 REPAIR OF LEFT INDEX AND LONG FINGER BY FUSION /  REPAIR OF RING FINGER;  Surgeon: Milly Jakob, MD;  Location: [facility];  Service: Orthopedics;  Laterality: Left;  . BACK SURGERY    . CARDIAC CATHETERIZATION N/A 12/22/2015   Procedure: Left Heart Cath and Coronary Angiography;  Surgeon: Peter M Martinique, MD;  Location: [facility];  Service: Cardiovascular;  Laterality: N/A;  . CARDIAC ELECTROPHYSIOLOGY STUDY AND ABLATION    . ELECTROPHYSIOLOGIC STUDY N/A 02/19/2016   Procedure: SVT Ablation;  Surgeon: Evans Lance, MD;  Location: [facility];  Service: Cardiovascular;  Laterality: N/A;  . septal reconstruction  2017  . TURBINATE REDUCTION Bilateral 07/2016  . WISDOM TOOTH EXTRACTION      There were no vitals filed for this visit.   Subjective  Assessment - 12/23/20 1703    Subjective Knees feel better after ionto. Notes that 2 days ago he got up out of bed and had intense pain in his back. Pain is located along the R mid and LB. Notes pain down to the R groin. Denies N/T. Feeling a little better today. Pain worse with bending, standing. Wearing a back brace.    Pertinent History none    Diagnostic tests Per MD- "Independent review of the CT lumbar spine from 2/15 shows stable interbody fusion at L4/5 and degenerative disc disease at L5-S1.    Independent review of the CT cervical spine 2/15 shows severe degenerative change at C5-6.    Independent review the B knee x-ray from 2/15 shows no acute changes."    Patient Stated Goals decrease pain and dizziness    Currently in Pain? Yes    Pain Score 6     Pain Location Neck    Pain Orientation Posterior    Pain Descriptors / Indicators Aching    Pain Type Acute pain    Pain Score 7    Pain Location Knee    Pain Orientation Right;Left;Anterior    Pain Descriptors / Indicators Aching    Pain Type Acute pain    Pain Score 9    Pain Location Back    Pain Orientation Right  Pain Descriptors / Indicators Sharp    Pain Type Acute pain              [facility] PT Assessment - 12/23/20 0001      Palpation   Palpation comment TTP in B QLs and L unilateral facet of L1, R unilateral facet of L2                         [facility] Adult PT Treatment/Exercise - 12/23/20 0001      Lumbar Exercises: Stretches   Passive Hamstring Stretch Right;Left;1 rep;30 seconds    Passive Hamstring Stretch Limitations strap assist    Single Knee to Chest Stretch Right;Left;1 rep;30 seconds    Single Knee to Chest Stretch Limitations strap assist    Lower Trunk Rotation Limitations x20   cues to avoid pushing into pain     Lumbar Exercises: Standing   Other Standing Lumbar Exercises lumbar extension at wall 10x3"   discontinued d/t pain     Lumbar Exercises: Seated   Other Seated Lumbar  Exercises prayer stretch with green pball 5x5" anterior, R, L      Lumbar Exercises: Sidelying   Other Sidelying Lumbar Exercises R/L open book stretch 10x each   cueing to encourage rotation through torso without jerking     Knee/Hip Exercises: Aerobic   Nustep L3 x 6 min (UEs/LEs)      Modalities   Modalities Electrical Stimulation      Moist Heat Therapy   Number Minutes Moist Heat 15 Minutes    Moist Heat Location Lumbar Spine      Electrical Stimulation   Electrical Stimulation Location B thoracolumbar paraspinals    Electrical Stimulation Action IFC    Electrical Stimulation Parameters output to tolerance; 15 min    Electrical Stimulation Goals Pain                  PT Education - 12/23/20 1744    Education Details update to HEP    Person(s) Educated Patient    Methods Explanation;Demonstration;Tactile cues;Verbal cues;Handout    Comprehension Verbalized understanding;Returned demonstration            PT Short Term Goals - 12/18/20 1707      PT SHORT TERM GOAL #1   Title Patient to be independent with initial HEP.    Time 3    Period Weeks    Status Achieved    Target Date 11/26/20             PT Long Term Goals - 12/18/20 1707      PT LONG TERM GOAL #1   Title Patient to be independent with advanced HEP.    Time 8    Period Weeks    Status Partially Met   met for current   Target Date 02/12/21      PT LONG TERM GOAL #2   Title Patient to demonstrate L UE strength >/=4+/5.    Time 8    Period Weeks    Status Partially Met   improved in all planes of L shoulder, in elbow flexion/extension, L wrist extension, and grip strength.   Target Date 02/12/21      PT LONG TERM GOAL #3   Title Patient to demonstrate B LE strength >/=4+/5.    Time 8    Period Weeks    Status Partially Met   improvement in B hip flexion, abduction, adduction and L knee flexion/extension  Target Date 02/12/21      PT LONG TERM GOAL #4   Title Patient to  demonstrate cervical AROM WFL and without pain limiting.    Time 8    Period Weeks    Status On-going   improved in flexion, extension, and R sidebending   Target Date 02/12/21      PT LONG TERM GOAL #5   Title Patient to report tolerance for kneeling to pray d/t improvement in pain levels.    Time 8    Period Weeks    Status On-going   attempted modifying this activity with addition of 2 folded pillows under knee- helps the R knee but still unable to tolerate on L knee   Target Date 02/12/21      PT LONG TERM GOAL #6   Title Patient to report tolerance for 2 hours on his feet without pain limiting.    Time 8    Period Weeks    Status On-going   5-6 minutes   Target Date 02/12/21      PT LONG TERM GOAL #7   Title Patient to report 90% improvement in dizziness.    Time 8    Period Weeks    Status Partially Met   70% improvement   Target Date 02/12/21                 Plan - 12/23/20 1744    Clinical Impression Statement Patient arrived to session with report of improvement in B knee pain from ionto however noting increased R midback and LB pain when getting out of bed 2 days ago. Pain radiates to the R groin, without N/T. Worse with bending, standing. Patient TTP in B QLs and L unilateral facet of L1, R unilateral facet of L2. Worked on gentle thoracolumbar and lumbopelvic stretching with cueing to avoid pushing into pain. Patient with limited R/L rotation ROM with open book stretch, requiring cueing to encourage improved ROM without jerking movements. Patient still with dizziness with R rolling and supine to sit, which resolved within ~10 seconds. Attempted lumbar extension ROM stretching, which was discontinued d/t LBP. Ended session with moist heat and e-stim to LBP for pain relief. Patient reported improvement in pain at end of session and reported understanding of HEP update.    Personal Factors and Comorbidities Age;Comorbidity 1;Fitness;Past/Current Experience;Time since  onset of injury/illness/exacerbation    Comorbidities back surgery L3-4 1997    PT Frequency 2x / week    PT Duration 8 weeks    PT Treatment/Interventions ADLs/Self Care Home Management;Canalith Repostioning;Cryotherapy;Electrical Stimulation;Iontophoresis 30m/ml Dexamethasone;Moist Heat;Traction;Balance training;Therapeutic exercise;Therapeutic activities;Functional mobility training;Stair training;Gait training;Ultrasound;Neuromuscular re-education;Patient/family education;Manual techniques;Vestibular;Vasopneumatic Device;Taping;Energy conservation;Dry needling;Passive range of motion;Scar mobilization    PT Next Visit Plan progress ROM and strength; reassess R DH/sidelying test, progress VOR training    Consulted and Agree with Plan of Care Patient           Patient will benefit from skilled therapeutic intervention in order to improve the following deficits and impairments:  Abnormal gait,Hypomobility,Decreased activity tolerance,Decreased strength,Increased fascial restricitons,Impaired UE functional use,Pain,Difficulty walking,Decreased balance,Increased muscle spasms,Improper body mechanics,Dizziness,Decreased range of motion,Impaired flexibility,Postural dysfunction  Visit Diagnosis: Radiculopathy, cervical region  Muscle weakness (generalized)  Chronic pain of left knee  Acute pain of right knee  Bilateral low back pain, unspecified chronicity, unspecified whether sciatica present  Difficulty in walking, not elsewhere classified  Dizziness and giddiness     Problem List Patient Active Problem List   Diagnosis Date Noted  .  Neck pain 11/13/2020  . Vertigo 11/13/2020  . Hypertension 11/03/2020  . Contusion of bone 10/23/2020  . Degenerative tear of left medial meniscus 10/23/2020  . Concussion with no loss of consciousness 10/23/2020  . Paroxysmal atrial fibrillation ([place]) 05/22/2019  . Unspecified atrial flutter ([place]) 05/22/2019  . Pain in right foot 03/30/2018  .  Perennial and seasonal allergic rhinitis 11/09/2017  . History of food allergy 11/09/2017  . Wheezing 11/09/2017  . Dyspnea/wheezing 10/31/2017  . Cough 10/31/2017  . Bronchitis, mucopurulent recurrent ([place]) 09/07/2017  . SVT (supraventricular tachycardia) ([place]) 04/23/2015  . Abdominal pain, epigastric 06/03/2013  . Unspecified vitamin D deficiency 04/09/2013  . Esophageal reflux 04/09/2013  . Hepatitis B carrier ([place]) 04/09/2013  . Other and unspecified hyperlipidemia 04/09/2013  . Unspecified sleep apnea 04/09/2013      Janene Harvey, PT, DPT 12/23/20 6:13 PM   [facility] [address]  [place], [place], [address] Phone: [phone]   Fax:  [phone]  Name: Richard Walker MRN: [id number] Date of Birth: [birth year]

## 2020-12-24 ENCOUNTER — Ambulatory Visit: Payer: Medicare Other

## 2020-12-24 DIAGNOSIS — M545 Low back pain, unspecified: Secondary | ICD-10-CM

## 2020-12-24 DIAGNOSIS — M5412 Radiculopathy, cervical region: Secondary | ICD-10-CM

## 2020-12-24 DIAGNOSIS — M6281 Muscle weakness (generalized): Secondary | ICD-10-CM

## 2020-12-24 DIAGNOSIS — R262 Difficulty in walking, not elsewhere classified: Secondary | ICD-10-CM

## 2020-12-24 DIAGNOSIS — M25562 Pain in left knee: Secondary | ICD-10-CM

## 2020-12-24 DIAGNOSIS — M25561 Pain in right knee: Secondary | ICD-10-CM

## 2020-12-24 DIAGNOSIS — R42 Dizziness and giddiness: Secondary | ICD-10-CM

## 2020-12-24 DIAGNOSIS — G8929 Other chronic pain: Secondary | ICD-10-CM

## 2020-12-24 NOTE — Therapy (Signed)
[facility] [address]  [place], [place], [address] Phone: [phone]   Fax:  [phone]  Physical Therapy Treatment  Patient Details  Name: Richard Walker MRN: [id number] Date of Birth: Jan 06, 1956 Referring Provider (PT): Clearance Coots, MD   Encounter Date: 12/24/2020   PT End of Session - 12/24/20 1543    Visit Number 12    Number of Visits 26    Date for PT Re-Evaluation 02/12/21    Authorization Type Medicare & Traditional Medicaid    PT Start Time 3888    PT Stop Time 1536    PT Time Calculation (min) 47 min    Activity Tolerance Patient tolerated treatment well    Behavior During Therapy [facility] for tasks assessed/performed           Past Medical History:  Diagnosis Date  . DDD (degenerative disc disease), lumbar    L3/L4  . Diverticulosis   . GERD (gastroesophageal reflux disease)   . Hyperlipidemia   . Low sperm motility   . Pre-diabetes   . Sleep apnea    uses cpap   . SVT (supraventricular tachycardia) (HCC)     Past Surgical History:  Procedure Laterality Date  . 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 REPAIR OF LEFT INDEX AND LONG FINGER BY FUSION /  REPAIR OF RING FINGER;  Surgeon: Milly Jakob, MD;  Location: [facility];  Service: Orthopedics;  Laterality: Left;  . BACK SURGERY    . CARDIAC CATHETERIZATION N/A 12/22/2015   Procedure: Left Heart Cath and Coronary Angiography;  Surgeon: Peter M Martinique, MD;  Location: [facility];  Service: Cardiovascular;  Laterality: N/A;  . CARDIAC ELECTROPHYSIOLOGY STUDY AND ABLATION    . ELECTROPHYSIOLOGIC STUDY N/A 02/19/2016   Procedure: SVT Ablation;  Surgeon: Evans Lance, MD;  Location: [facility];  Service: Cardiovascular;  Laterality: N/A;  . septal reconstruction  2017  . TURBINATE REDUCTION Bilateral 07/2016  . WISDOM TOOTH EXTRACTION      There were no vitals filed for this visit.   Subjective Assessment - 12/24/20 1456     Subjective Notes he is doing better had good response to estim and heat last session.    Pertinent History none    Diagnostic tests Per MD- "Independent review of the CT lumbar spine from 2/15 shows stable interbody fusion at L4/5 and degenerative disc disease at L5-S1.    Independent review of the CT cervical spine 2/15 shows severe degenerative change at C5-6.    Independent review the B knee x-ray from 2/15 shows no acute changes."    Patient Stated Goals decrease pain and dizziness    Currently in Pain? Yes    Pain Score 6     Pain Location Back    Pain Orientation Lower    Pain Descriptors / Indicators Aching    Pain Type Acute pain    Pain Score 5    Pain Location Knee    Pain Orientation Right;Left;Anterior    Pain Descriptors / Indicators Aching                             [facility] Adult PT Treatment/Exercise - 12/24/20 0001      Lumbar Exercises: Stretches   Passive Hamstring Stretch Left;Right;30 seconds    Passive Hamstring Stretch Limitations seated    Other Lumbar Stretch Exercise open book standing against wall 5x5"  Lumbar Exercises: Standing   Other Standing Lumbar Exercises lumbar ext with hands on hips 10x3"      Lumbar Exercises: Seated   Sit to Stand --   2x5 reps arms crossed; hip extension at the top   Other Seated Lumbar Exercises prayer stretch with orange pball 10x5" anterior; 5x5" R/L      Knee/Hip Exercises: Aerobic   Nustep L3 x 6 min (UEs/LEs)      Modalities   Modalities Electrical Stimulation      Moist Heat Therapy   Number Minutes Moist Heat 12 Minutes    Moist Heat Location Lumbar Spine      Electrical Stimulation   Electrical Stimulation Location B thoracolumbar paraspinals    Electrical Stimulation Action IFC    Electrical Stimulation Parameters intensity to pt tolerance    Electrical Stimulation Goals Pain      Iontophoresis   Type of Iontophoresis Dexamethasone    Location B medial knees   1 patch each    Dose 101m*minutes, 1.0 ml    Time 4 hour patch                    PT Short Term Goals - 12/18/20 1707      PT SHORT TERM GOAL #1   Title Patient to be independent with initial HEP.    Time 3    Period Weeks    Status Achieved    Target Date 11/26/20             PT Long Term Goals - 12/18/20 1707      PT LONG TERM GOAL #1   Title Patient to be independent with advanced HEP.    Time 8    Period Weeks    Status Partially Met   met for current   Target Date 02/12/21      PT LONG TERM GOAL #2   Title Patient to demonstrate L UE strength >/=4+/5.    Time 8    Period Weeks    Status Partially Met   improved in all planes of L shoulder, in elbow flexion/extension, L wrist extension, and grip strength.   Target Date 02/12/21      PT LONG TERM GOAL #3   Title Patient to demonstrate B LE strength >/=4+/5.    Time 8    Period Weeks    Status Partially Met   improvement in B hip flexion, abduction, adduction and L knee flexion/extension   Target Date 02/12/21      PT LONG TERM GOAL #4   Title Patient to demonstrate cervical AROM WFL and without pain limiting.    Time 8    Period Weeks    Status On-going   improved in flexion, extension, and R sidebending   Target Date 02/12/21      PT LONG TERM GOAL #5   Title Patient to report tolerance for kneeling to pray d/t improvement in pain levels.    Time 8    Period Weeks    Status On-going   attempted modifying this activity with addition of 2 folded pillows under knee- helps the R knee but still unable to tolerate on L knee   Target Date 02/12/21      PT LONG TERM GOAL #6   Title Patient to report tolerance for 2 hours on his feet without pain limiting.    Time 8    Period Weeks    Status On-going   5-6 minutes  Target Date 02/12/21      PT LONG TERM GOAL #7   Title Patient to report 90% improvement in dizziness.    Time 8    Period Weeks    Status Partially Met   70% improvement   Target Date 02/12/21                  Plan - 12/24/20 1545    Clinical Impression Statement Pt reports much improvement after the estim and heat last session. He notes that his LBP and knee pain has gotten better since last visit. He was able to complete STS w/o reports of knee pain with good form. Also noting relief from the lumbar extensions. He showed increased tightness with L trunk rotation during the standing open books. Cues required during hamstring stretches to avoid pushing too far into pain and to not slouch foward. He definitely would continue to benefit from lumbar mobility exercises along with estim for pain and stiffness in LB. Ended session with estim and heat to lumbar spine along with ionto patches on B knees post session.    Personal Factors and Comorbidities Age;Comorbidity 1;Fitness;Past/Current Experience;Time since onset of injury/illness/exacerbation    Comorbidities back surgery L3-4 1997    PT Frequency 2x / week    PT Duration 8 weeks    PT Treatment/Interventions ADLs/Self Care Home Management;Canalith Repostioning;Cryotherapy;Electrical Stimulation;Iontophoresis 73m/ml Dexamethasone;Moist Heat;Traction;Balance training;Therapeutic exercise;Therapeutic activities;Functional mobility training;Stair training;Gait training;Ultrasound;Neuromuscular re-education;Patient/family education;Manual techniques;Vestibular;Vasopneumatic Device;Taping;Energy conservation;Dry needling;Passive range of motion;Scar mobilization    PT Next Visit Plan progress ROM and strength; reassess R DH/sidelying test, progress VOR training    Consulted and Agree with Plan of Care Patient           Patient will benefit from skilled therapeutic intervention in order to improve the following deficits and impairments:  Abnormal gait,Hypomobility,Decreased activity tolerance,Decreased strength,Increased fascial restricitons,Impaired UE functional use,Pain,Difficulty walking,Decreased balance,Increased muscle spasms,Improper  body mechanics,Dizziness,Decreased range of motion,Impaired flexibility,Postural dysfunction  Visit Diagnosis: Radiculopathy, cervical region  Muscle weakness (generalized)  Chronic pain of left knee  Acute pain of right knee  Bilateral low back pain, unspecified chronicity, unspecified whether sciatica present  Difficulty in walking, not elsewhere classified  Dizziness and giddiness     Problem List Patient Active Problem List   Diagnosis Date Noted  . Neck pain 11/13/2020  . Vertigo 11/13/2020  . Hypertension 11/03/2020  . Contusion of bone 10/23/2020  . Degenerative tear of left medial meniscus 10/23/2020  . Concussion with no loss of consciousness 10/23/2020  . Paroxysmal atrial fibrillation ([place] 05/22/2019  . Unspecified atrial flutter ([place] 05/22/2019  . Pain in right foot 03/30/2018  . Perennial and seasonal allergic rhinitis 11/09/2017  . History of food allergy 11/09/2017  . Wheezing 11/09/2017  . Dyspnea/wheezing 10/31/2017  . Cough 10/31/2017  . Bronchitis, mucopurulent recurrent ([place] 09/07/2017  . SVT (supraventricular tachycardia) ([place] 04/23/2015  . Abdominal pain, epigastric 06/03/2013  . Unspecified vitamin D deficiency 04/09/2013  . Esophageal reflux 04/09/2013  . Hepatitis B carrier ([place] 04/09/2013  . Other and unspecified hyperlipidemia 04/09/2013  . Unspecified sleep apnea 04/09/2013    BArtist Pais PTA 12/24/2020, 3:54 PM  [facility] [place] [address]Phone: [phone]  Fax:  [phone] Name: Richard MINUSMRN: [id number] of Birth: [birth year]

## 2020-12-25 ENCOUNTER — Ambulatory Visit (INDEPENDENT_AMBULATORY_CARE_PROVIDER_SITE_OTHER): Payer: Medicare Other | Admitting: Family Medicine

## 2020-12-25 ENCOUNTER — Other Ambulatory Visit: Payer: Self-pay

## 2020-12-25 ENCOUNTER — Encounter: Payer: Self-pay | Admitting: Family Medicine

## 2020-12-25 VITALS — BP 140/94 | Ht 69.0 in | Wt 261.0 lb

## 2020-12-25 DIAGNOSIS — T148XXA Other injury of unspecified body region, initial encounter: Secondary | ICD-10-CM

## 2020-12-25 DIAGNOSIS — R42 Dizziness and giddiness: Secondary | ICD-10-CM

## 2020-12-25 DIAGNOSIS — M542 Cervicalgia: Secondary | ICD-10-CM

## 2020-12-25 DIAGNOSIS — S060X0D Concussion without loss of consciousness, subsequent encounter: Secondary | ICD-10-CM

## 2020-12-25 DIAGNOSIS — M23204 Derangement of unspecified medial meniscus due to old tear or injury, left knee: Secondary | ICD-10-CM
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
## 2020-12-25 NOTE — Patient Instructions (Signed)
Good to see you Please continue with physical therapy  Please try the Zynex NexWave  Please call to schedule the MRI.   Please send me a message in MyChart with any questions or updates.  We will setup a virtual visit once the MRI is resulted.   --Dr. Jordan Likes
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
## 2020-12-29 ENCOUNTER — Other Ambulatory Visit: Payer: Self-pay

## 2020-12-29 ENCOUNTER — Ambulatory Visit: Payer: Medicare Other

## 2020-12-29 DIAGNOSIS — R42 Dizziness and giddiness: Secondary | ICD-10-CM

## 2020-12-29 DIAGNOSIS — M25562 Pain in left knee: Secondary | ICD-10-CM

## 2020-12-29 DIAGNOSIS — R262 Difficulty in walking, not elsewhere classified: Secondary | ICD-10-CM

## 2020-12-29 DIAGNOSIS — M545 Low back pain, unspecified: Secondary | ICD-10-CM

## 2020-12-29 DIAGNOSIS — M6281 Muscle weakness (generalized): Secondary | ICD-10-CM

## 2020-12-29 DIAGNOSIS — G8929 Other chronic pain: Secondary | ICD-10-CM

## 2020-12-29 DIAGNOSIS — M5412 Radiculopathy, cervical region: Secondary | ICD-10-CM | POA: Diagnosis not present

## 2020-12-29 DIAGNOSIS — M25561 Pain in right knee: Secondary | ICD-10-CM

## 2020-12-29 NOTE — Therapy (Signed)
[facility] [address]  [place], [place], [address] Phone: [phone]   Fax:  [phone]  Physical Therapy Treatment  Patient Details  Name: Richard Walker MRN: [id number] Date of Birth: 08-02-1956 Referring Provider (PT): Clearance Coots, MD   Encounter Date: 12/29/2020   PT End of Session - 12/29/20 1654    Visit Number 13    Number of Visits 26    Date for PT Re-Evaluation 02/12/21    Authorization Type Medicare & Traditional Medicaid    PT Start Time 8266    PT Stop Time 1705    PT Time Calculation (min) 51 min    Activity Tolerance Patient tolerated treatment well    Behavior During Therapy [facility] for tasks assessed/performed           Past Medical History:  Diagnosis Date  . DDD (degenerative disc disease), lumbar    L3/L4  . Diverticulosis   . GERD (gastroesophageal reflux disease)   . Hyperlipidemia   . Low sperm motility   . Pre-diabetes   . Sleep apnea    uses cpap   . SVT (supraventricular tachycardia) (HCC)     Past Surgical History:  Procedure Laterality Date  . 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 REPAIR OF LEFT INDEX AND LONG FINGER BY FUSION /  REPAIR OF RING FINGER;  Surgeon: Milly Jakob, MD;  Location: [facility];  Service: Orthopedics;  Laterality: Left;  . BACK SURGERY    . CARDIAC CATHETERIZATION N/A 12/22/2015   Procedure: Left Heart Cath and Coronary Angiography;  Surgeon: Peter M Martinique, MD;  Location: [place] CV LAB;  Service: Cardiovascular;  Laterality: N/A;  . CARDIAC ELECTROPHYSIOLOGY STUDY AND ABLATION    . ELECTROPHYSIOLOGIC STUDY N/A 02/19/2016   Procedure: SVT Ablation;  Surgeon: Evans Lance, MD;  Location: [place] CV LAB;  Service: Cardiovascular;  Laterality: N/A;  . septal reconstruction  2017  . TURBINATE REDUCTION Bilateral 07/2016  . WISDOM TOOTH EXTRACTION      There were no vitals filed for this visit.   Subjective Assessment - 12/29/20 1611     Subjective Pt reports he was on his feet a lot over the weekend, which may have caused some pain today.    Pertinent History none    Diagnostic tests Per MD- "Independent review of the CT lumbar spine from 2/15 shows stable interbody fusion at L4/5 and degenerative disc disease at L5-S1.    Independent review of the CT cervical spine 2/15 shows severe degenerative change at C5-6.    Independent review the B knee x-ray from 2/15 shows no acute changes."    Patient Stated Goals decrease pain and dizziness    Currently in Pain? Yes    Pain Score 8     Pain Location Knee    Pain Descriptors / Indicators Aching    Pain Type Acute pain    Pain Score 6    Pain Location Shoulder    Pain Orientation Right;Left;Mid    Pain Descriptors / Indicators Aching    Pain Type Chronic pain                             [facility] Adult PT Treatment/Exercise - 12/29/20 0001      Neck Exercises: Machines for Strengthening   Nustep L4x32mn      Lumbar Exercises: Standing   Functional Squats 10 reps  Functional Squats Limitations counter support    Row Strengthening;Both;10 reps;Theraband    Theraband Level (Row) Level 2 (Red)    Shoulder Extension Strengthening;Both;10 reps;Theraband    Theraband Level (Shoulder Extension) Level 2 (Red)    Other Standing Lumbar Exercises wall squats 10x2"    Other Standing Lumbar Exercises lumbar ext with hands on hips 10x5"; horizontal ABD with R tband 10 reps      Lumbar Exercises: Sidelying   Other Sidelying Lumbar Exercises open book with hand behind head 10x3"R/L      Modalities   Modalities Electrical Stimulation      Moist Heat Therapy   Number Minutes Moist Heat 15 Minutes    Moist Heat Location Lumbar Spine      Electrical Stimulation   Electrical Stimulation Location B thoracolumbar paraspinals    Electrical Stimulation Action IFC    Electrical Stimulation Parameters intensity to pt tolerance    Electrical Stimulation Goals Pain                     PT Short Term Goals - 12/18/20 1707      PT SHORT TERM GOAL #1   Title Patient to be independent with initial HEP.    Time 3    Period Weeks    Status Achieved    Target Date 11/26/20             PT Long Term Goals - 12/29/20 1642      PT LONG TERM GOAL #1   Title Patient to be independent with advanced HEP.    Time 8    Period Weeks    Status Partially Met   met for current     PT LONG TERM GOAL #2   Title Patient to demonstrate L UE strength >/=4+/5.    Time 8    Period Weeks    Status Partially Met   improved in all planes of L shoulder, in elbow flexion/extension, L wrist extension, and grip strength.     PT LONG TERM GOAL #3   Title Patient to demonstrate B LE strength >/=4+/5.    Time 8    Period Weeks    Status Partially Met   improvement in B hip flexion, abduction, adduction and L knee flexion/extension     PT LONG TERM GOAL #4   Title Patient to demonstrate cervical AROM WFL and without pain limiting.    Time 8    Period Weeks    Status On-going   improved in flexion, extension, and R sidebending     PT LONG TERM GOAL #5   Title Patient to report tolerance for kneeling to pray d/t improvement in pain levels.    Time 8    Period Weeks    Status On-going   attempted modifying this activity with addition of 2 folded pillows under knee- helps the R knee but still unable to tolerate on L knee     PT LONG TERM GOAL #6   Title Patient to report tolerance for 2 hours on his feet without pain limiting.    Time 8    Period Weeks    Status On-going   5-6 minutes     PT LONG TERM GOAL #7   Title Patient to report 90% improvement in dizziness.    Time 8    Period Weeks    Status Partially Met   70% improvement  Plan - 12/29/20 1656    Clinical Impression Statement Pt notes that he was on his feet a lot and is sore from it as he is not used to doing so much on his feet. He reports his last doctor visit going  well and MD wants him to continue with PT as he is making improvements. He noted some pain with the squatting exercises on the medial portion of his knee, reporting that he always has pain with squatting. He is scheduled to have an MRI of his L knee this week. Other than that no complaints with the exercises today, he did well only with cues needed to hinge hips during squats to avoid pressure on knees. Ended session with estim and heat for pain.    Personal Factors and Comorbidities Age;Comorbidity 1;Fitness;Past/Current Experience;Time since onset of injury/illness/exacerbation    Comorbidities back surgery L3-4 1997    PT Frequency 2x / week    PT Duration 8 weeks    PT Treatment/Interventions ADLs/Self Care Home Management;Canalith Repostioning;Cryotherapy;Electrical Stimulation;Iontophoresis 41m/ml Dexamethasone;Moist Heat;Traction;Balance training;Therapeutic exercise;Therapeutic activities;Functional mobility training;Stair training;Gait training;Ultrasound;Neuromuscular re-education;Patient/family education;Manual techniques;Vestibular;Vasopneumatic Device;Taping;Energy conservation;Dry needling;Passive range of motion;Scar mobilization    PT Next Visit Plan progress ROM and strength; reassess R DH/sidelying test, progress VOR training    Consulted and Agree with Plan of Care Patient           Patient will benefit from skilled therapeutic intervention in order to improve the following deficits and impairments:  Abnormal gait,Hypomobility,Decreased activity tolerance,Decreased strength,Increased fascial restricitons,Impaired UE functional use,Pain,Difficulty walking,Decreased balance,Increased muscle spasms,Improper body mechanics,Dizziness,Decreased range of motion,Impaired flexibility,Postural dysfunction  Visit Diagnosis: Radiculopathy, cervical region  Muscle weakness (generalized)  Chronic pain of left knee  Acute pain of right knee  Bilateral low back pain, unspecified  chronicity, unspecified whether sciatica present  Difficulty in walking, not elsewhere classified  Dizziness and giddiness     Problem List Patient Active Problem List   Diagnosis Date Noted  . Neck pain 11/13/2020  . Vertigo 11/13/2020  . Hypertension 11/03/2020  . Contusion of bone 10/23/2020  . Degenerative tear of left medial meniscus 10/23/2020  . Concussion with no loss of consciousness 10/23/2020  . Paroxysmal atrial fibrillation ([place] 05/22/2019  . Unspecified atrial flutter ([place] 05/22/2019  . Pain in right foot 03/30/2018  . Perennial and seasonal allergic rhinitis 11/09/2017  . History of food allergy 11/09/2017  . Wheezing 11/09/2017  . Dyspnea/wheezing 10/31/2017  . Cough 10/31/2017  . Bronchitis, mucopurulent recurrent ([place] 09/07/2017  . SVT (supraventricular tachycardia) ([place] 04/23/2015  . Abdominal pain, epigastric 06/03/2013  . Unspecified vitamin D deficiency 04/09/2013  . Esophageal reflux 04/09/2013  . Hepatitis B carrier ([place] 04/09/2013  . Other and unspecified hyperlipidemia 04/09/2013  . Unspecified sleep apnea 04/09/2013    BArtist Pais PTA 12/29/2020, 5:05 PM  [facility] [place] [address]Phone: [phone]  Fax:  [phone] Name: Richard FOLTSMRN: [id number] of Birth: 1January 09, 1957

## 2020-12-31 ENCOUNTER — Other Ambulatory Visit: Payer: Self-pay

## 2020-12-31 ENCOUNTER — Encounter: Payer: Self-pay | Admitting: Physical Therapy

## 2020-12-31 ENCOUNTER — Ambulatory Visit: Payer: Medicare Other | Admitting: Physical Therapy

## 2020-12-31 DIAGNOSIS — R262 Difficulty in walking, not elsewhere classified: Secondary | ICD-10-CM

## 2020-12-31 DIAGNOSIS — R42 Dizziness and giddiness: Secondary | ICD-10-CM

## 2020-12-31 DIAGNOSIS — M25562 Pain in left knee: Secondary | ICD-10-CM

## 2020-12-31 DIAGNOSIS — M25561 Pain in right knee: Secondary | ICD-10-CM

## 2020-12-31 DIAGNOSIS — M5412 Radiculopathy, cervical region: Secondary | ICD-10-CM

## 2020-12-31 DIAGNOSIS — M6281 Muscle weakness (generalized): Secondary | ICD-10-CM

## 2020-12-31 DIAGNOSIS — G8929 Other chronic pain: Secondary | ICD-10-CM

## 2020-12-31 DIAGNOSIS — M545 Low back pain, unspecified: Secondary | ICD-10-CM

## 2020-12-31 NOTE — Therapy (Signed)
[facility] [place] [address]  [place], [place], [address] Phone: [phone]   Fax:  [phone]  Physical Therapy Treatment  Patient Details  Name: Richard Walker MRN: [id number] Date of Birth: [birth year] Referring Provider (PT): Clearance Coots, MD   Encounter Date: 12/31/2020   PT End of Session - 12/31/20 1704    Visit Number 14    Number of Visits 26    Date for PT Re-Evaluation 02/12/21    Authorization Type Medicare & Traditional Medicaid    PT Start Time 4628    PT Stop Time 1702    PT Time Calculation (min) 45 min    Activity Tolerance Patient tolerated treatment well    Behavior During Therapy [facility] for tasks assessed/performed           Past Medical History:  Diagnosis Date  . DDD (degenerative disc disease), lumbar    L3/L4  . Diverticulosis   . GERD (gastroesophageal reflux disease)   . Hyperlipidemia   . Low sperm motility   . Pre-diabetes   . Sleep apnea    uses cpap   . SVT (supraventricular tachycardia) (HCC)     Past Surgical History:  Procedure Laterality Date  . 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 REPAIR OF LEFT INDEX AND LONG FINGER BY FUSION /  REPAIR OF RING FINGER;  Surgeon: Milly Jakob, MD;  Location: [place];  Service: Orthopedics;  Laterality: Left;  . BACK SURGERY    . CARDIAC CATHETERIZATION N/A 12/22/2015   Procedure: Left Heart Cath and Coronary Angiography;  Surgeon: Peter M Martinique, MD;  Location: [place] [facility];  Service: Cardiovascular;  Laterality: N/A;  . CARDIAC ELECTROPHYSIOLOGY STUDY AND ABLATION    . ELECTROPHYSIOLOGIC STUDY N/A 02/19/2016   Procedure: SVT Ablation;  Surgeon: Evans Lance, MD;  Location: [place] [facility];  Service: Cardiovascular;  Laterality: N/A;  . septal reconstruction  2017  . TURBINATE REDUCTION Bilateral 07/2016  . WISDOM TOOTH EXTRACTION      There were no vitals filed for this visit.   Subjective Assessment - 12/31/20 1618     Subjective Back is feeling much better- used the lumbar belt and wearing a brace on his L knee. Dizziness is also improving- cannot recall any dizziness since monday.    Pertinent History none    Diagnostic tests Per MD- "Independent review of the CT lumbar spine from 2/15 shows stable interbody fusion at L4/5 and degenerative disc disease at L5-S1.    Independent review of the CT cervical spine 2/15 shows severe degenerative change at C5-6.    Independent review the B knee x-ray from 2/15 shows no acute changes."    Patient Stated Goals decrease pain and dizziness    Currently in Pain? Yes    Pain Score 5     Pain Location Knee    Pain Orientation Right;Left;Anterior    Pain Descriptors / Indicators Aching    Pain Type Acute pain    Pain Score 5    Pain Location Back    Pain Orientation Lower    Pain Descriptors / Indicators Sharp    Pain Type Acute pain              [facility] PT Assessment - 12/31/20 0001      ROM / Strength   AROM / PROM / Strength AROM;PROM      AROM   Right Knee Extension 2    Right  Knee Flexion 120    Left Knee Extension 3    Left Knee Flexion 114      PROM   PROM Assessment Site Knee    Right/Left Knee Right;Left    Right Knee Extension 0    Right Knee Flexion 140    Left Knee Extension 1    Left Knee Flexion 120                         [facility] Adult PT Treatment/Exercise - 12/31/20 0001      Knee/Hip Exercises: Aerobic   Nustep L5 x 6 min (UEs/LEs)      Knee/Hip Exercises: Supine   Quad Sets Strengthening;Right;Left;1 set;10 reps    Quad Sets Limitations 10x5" with folded pillow under heel    Heel Slides AAROM;1 set;10 reps    Heel Slides Limitations 10x3" with strap and pball    Straight Leg Raises Strengthening;Right;Left;1 set;10 reps    Straight Leg Raises Limitations cueing to improve eccentric control when lowering      Knee/Hip Exercises: Sidelying   Clams 10x each with red TB      Manual Therapy   Manual Therapy  Taping    Kinesiotex Create Space      Kinesiotix   Create Space L knee chondromalacia patellae pattern                  PT Education - 12/31/20 1704    Education Details update to HEP; edu on KT wear time, removal, precautions, benefits    Person(s) Educated Patient    Methods Explanation;Demonstration;Tactile cues;Verbal cues;Handout    Comprehension Verbalized understanding;Returned demonstration            PT Short Term Goals - 12/18/20 1707      PT SHORT TERM GOAL #1   Title Patient to be independent with initial HEP.    Time 3    Period Weeks    Status Achieved    Target Date 11/26/20             PT Long Term Goals - 12/29/20 1642      PT LONG TERM GOAL #1   Title Patient to be independent with advanced HEP.    Time 8    Period Weeks    Status Partially Met   met for current     PT LONG TERM GOAL #2   Title Patient to demonstrate L UE strength >/=4+/5.    Time 8    Period Weeks    Status Partially Met   improved in all planes of L shoulder, in elbow flexion/extension, L wrist extension, and grip strength.     PT LONG TERM GOAL #3   Title Patient to demonstrate B LE strength >/=4+/5.    Time 8    Period Weeks    Status Partially Met   improvement in B hip flexion, abduction, adduction and L knee flexion/extension     PT LONG TERM GOAL #4   Title Patient to demonstrate cervical AROM WFL and without pain limiting.    Time 8    Period Weeks    Status On-going   improved in flexion, extension, and R sidebending     PT LONG TERM GOAL #5   Title Patient to report tolerance for kneeling to pray d/t improvement in pain levels.    Time 8    Period Weeks    Status On-going   attempted modifying this activity with  addition of 2 folded pillows under knee- helps the R knee but still unable to tolerate on L knee     PT LONG TERM GOAL #6   Title Patient to report tolerance for 2 hours on his feet without pain limiting.    Time 8    Period Weeks     Status On-going   5-6 minutes     PT LONG TERM GOAL #7   Title Patient to report 90% improvement in dizziness.    Time 8    Period Weeks    Status Partially Met   70% improvement                Plan - 12/31/20 1705    Clinical Impression Statement Patient arrived to session with improvement in back and knee pain. Also notes improving dizziness and noting no dizziness since Monday. B knee ROM was assessed with deficits seen in knee extension ROM as well as L knee flexion ROM. Worked on progressing quad and hip strengthening ther-ex. Patient with good ability to maintain near full TKE with SLRs, but required cues to control descent. Hip strengthening was performed with good tolerance, however patient required cues to avoid posterior trunk rotation. Patient still with brief c/o dizziness upon sitting from mat ther-ex, which resolved after ~10 sec. Patient reported understanding of KT tape wear time, removal, precautions, thus applied KT tape to R knee at end of session for pain relief. No complaints at end of session.    Personal Factors and Comorbidities Age;Comorbidity 1;Fitness;Past/Current Experience;Time since onset of injury/illness/exacerbation    Comorbidities back surgery L3-4 1997    PT Frequency 2x / week    PT Duration 8 weeks    PT Treatment/Interventions ADLs/Self Care Home Management;Canalith Repostioning;Cryotherapy;Electrical Stimulation;Iontophoresis 64m/ml Dexamethasone;Moist Heat;Traction;Balance training;Therapeutic exercise;Therapeutic activities;Functional mobility training;Stair training;Gait training;Ultrasound;Neuromuscular re-education;Patient/family education;Manual techniques;Vestibular;Vasopneumatic Device;Taping;Energy conservation;Dry needling;Passive range of motion;Scar mobilization    PT Next Visit Plan progress ROM and strength; reassess R DH/sidelying test, progress VOR training    Consulted and Agree with Plan of Care Patient           Patient will  benefit from skilled therapeutic intervention in order to improve the following deficits and impairments:  Abnormal gait,Hypomobility,Decreased activity tolerance,Decreased strength,Increased fascial restricitons,Impaired UE functional use,Pain,Difficulty walking,Decreased balance,Increased muscle spasms,Improper body mechanics,Dizziness,Decreased range of motion,Impaired flexibility,Postural dysfunction  Visit Diagnosis: Radiculopathy, cervical region  Muscle weakness (generalized)  Chronic pain of left knee  Acute pain of right knee  Bilateral low back pain, unspecified chronicity, unspecified whether sciatica present  Difficulty in walking, not elsewhere classified  Dizziness and giddiness     Problem List Patient Active Problem List   Diagnosis Date Noted  . Neck pain 11/13/2020  . Vertigo 11/13/2020  . Hypertension 11/03/2020  . Contusion of bone 10/23/2020  . Degenerative tear of left medial meniscus 10/23/2020  . Concussion with no loss of consciousness 10/23/2020  . Paroxysmal atrial fibrillation ([place] 05/22/2019  . Unspecified atrial flutter ([place] 05/22/2019  . Pain in right foot 03/30/2018  . Perennial and seasonal allergic rhinitis 11/09/2017  . History of food allergy 11/09/2017  . Wheezing 11/09/2017  . Dyspnea/wheezing 10/31/2017  . Cough 10/31/2017  . Bronchitis, mucopurulent recurrent ([place] 09/07/2017  . SVT (supraventricular tachycardia) ([place] 04/23/2015  . Abdominal pain, epigastric 06/03/2013  . Unspecified vitamin D deficiency 04/09/2013  . Esophageal reflux 04/09/2013  . Hepatitis B carrier ([facility] 04/09/2013  . Other and unspecified hyperlipidemia 04/09/2013  . Unspecified sleep apnea 04/09/2013  Janene Harvey, PT, DPT 12/31/20 5:07 PM   [facility] [place] [address]  [place], [place], [address] Phone: [phone]   Fax:  [phone]  Name: Richard Walker MRN:  [id number] Date of Birth: May 12, 1956

## 2020-12-31 NOTE — Patient Instructions (Addendum)
   Kinesiology tape  What is kinesiology tape?  There are many brands of kinesiology tape. KTape, Rock Tape, Body Sport, Dynamic tape, to name a few.  It is an elasticized tape designed to support the body's natural healing process. This tape provides stability and support to muscles and joints without restricting motion.  It can also help decrease swelling in the area of application.  How does it work?  The tape microscopically lifts and decompresses the skin to allow for drainage of lymph (swelling) to flow away from area, reducing inflammation. The tape has the ability to help re-educate the neuromuscular system by targeting specific receptors in the skin. The presence of the tape increases the body's awareness of posture and body mechanics.  Do not use with:  . Open wounds . Skin lesions . Adhesive allergies  In some rare cases, mild/moderate skin irritation can occur. This can include redness, itchiness, or hives. If this occurs, immediately remove tape and consult your primary care physician if symptoms are severe or do not resolve within 2 days.  Safe removal of the tape:  To remove tape safely, hold nearby skin with one hand and gentle roll tape down with other hand. You can apply oil or conditioner to tape while in shower prior to removal to loosen adhesive. DO NOT swiftly rip tape off like a band-aid, as this could cause skin tears and additional skin irritation.     For questions, please contact your therapist at:  [facility] [place] [address]  [address] [place], [place], [address] Phone: [phone]   Fax:  [phone]     

## 2021-01-03 ENCOUNTER — Other Ambulatory Visit: Payer: Self-pay

## 2021-01-03 ENCOUNTER — Ambulatory Visit (HOSPITAL_BASED_OUTPATIENT_CLINIC_OR_DEPARTMENT_OTHER)
Admission: RE | Admit: 2021-01-03 | Discharge: 2021-01-03 | Disposition: A | Discharge status: Home / Self Care | Payer: Medicare Other | Source: Ambulatory Visit | Attending: Family Medicine | Admitting: Family Medicine

## 2021-01-03 DIAGNOSIS — M23204 Derangement of unspecified medial meniscus due to old tear or injury, left knee: Secondary | ICD-10-CM | POA: Diagnosis present

## 2021-01-06 ENCOUNTER — Ambulatory Visit: Payer: Medicare Other

## 2021-01-06 ENCOUNTER — Other Ambulatory Visit: Payer: Self-pay

## 2021-01-06 ENCOUNTER — Encounter: Payer: Self-pay | Admitting: Family Medicine

## 2021-01-06 ENCOUNTER — Ambulatory Visit (INDEPENDENT_AMBULATORY_CARE_PROVIDER_SITE_OTHER): Payer: Medicare Other | Admitting: Family Medicine

## 2021-01-06 DIAGNOSIS — G8929 Other chronic pain: Secondary | ICD-10-CM

## 2021-01-06 DIAGNOSIS — R42 Dizziness and giddiness: Secondary | ICD-10-CM | POA: Diagnosis not present

## 2021-01-06 DIAGNOSIS — M25562 Pain in left knee: Secondary | ICD-10-CM

## 2021-01-06 DIAGNOSIS — M5412 Radiculopathy, cervical region: Secondary | ICD-10-CM

## 2021-01-06 DIAGNOSIS — M6281 Muscle weakness (generalized): Secondary | ICD-10-CM

## 2021-01-06 DIAGNOSIS — M545 Low back pain, unspecified: Secondary | ICD-10-CM

## 2021-01-06 DIAGNOSIS — R262 Difficulty in walking, not elsewhere classified: Secondary | ICD-10-CM

## 2021-01-06 DIAGNOSIS — M25561 Pain in right knee: Secondary | ICD-10-CM

## 2021-01-06 DIAGNOSIS — M23204 Derangement of unspecified medial meniscus due to old tear or injury, left knee: Secondary | ICD-10-CM

## 2021-01-06 NOTE — Assessment & Plan Note (Signed)
Has gotten improvement with physical therapy. -Continue physical therapy

## 2021-01-06 NOTE — Therapy (Signed)
Richard Walker [address]  [place], [place], [address] Phone: [phone]   Fax:  [phone]  Physical Therapy Treatment  Patient Details  Name: Richard Walker MRN: [id number] Date of Birth: Jul 11, 1956 Referring Provider (PT): Clearance Coots, MD   Encounter Date: 01/06/2021   PT End of Session - 01/06/21 1620    Visit Number 15    Number of Visits 26    Date for PT Re-Evaluation 02/12/21    Authorization Type Medicare & Traditional Medicaid    PT Start Time 3833    PT Stop Time 1613    PT Time Calculation (min) 39 min    Activity Tolerance Patient tolerated treatment well    Behavior During Therapy [facility] for tasks assessed/performed           Past Medical History:  Diagnosis Date  . DDD (degenerative disc disease), lumbar    L3/L4  . Diverticulosis   . GERD (gastroesophageal reflux disease)   . Hyperlipidemia   . Low sperm motility   . Pre-diabetes   . Sleep apnea    uses cpap   . SVT (supraventricular tachycardia) (HCC)     Past Surgical History:  Procedure Laterality Date  . 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 REPAIR OF LEFT INDEX AND LONG FINGER BY FUSION /  REPAIR OF RING FINGER;  Surgeon: Milly Jakob, MD;  Location: [place];  Service: Orthopedics;  Laterality: Left;  . BACK SURGERY    . CARDIAC CATHETERIZATION N/A 12/22/2015   Procedure: Left Heart Cath and Coronary Angiography;  Surgeon: Peter M Martinique, MD;  Location: [place] [facility];  Service: Cardiovascular;  Laterality: N/A;  . CARDIAC ELECTROPHYSIOLOGY STUDY AND ABLATION    . ELECTROPHYSIOLOGIC STUDY N/A 02/19/2016   Procedure: SVT Ablation;  Surgeon: Evans Lance, MD;  Location: [place] [facility];  Service: Cardiovascular;  Laterality: N/A;  . septal reconstruction  2017  . TURBINATE REDUCTION Bilateral 07/2016  . WISDOM TOOTH EXTRACTION      There were no vitals filed for this visit.   Subjective Assessment - 01/06/21 1538     Subjective Pt reports MD told him he has arthritis in L knee and continued pain. Dizziness is way better than it used to be    Diagnostic tests Per MD- "Independent review of the CT lumbar spine from 2/15 shows stable interbody fusion at L4/5 and degenerative disc disease at L5-S1.    Independent review of the CT cervical spine 2/15 shows severe degenerative change at C5-6.    Independent review the B knee x-ray from 2/15 shows no acute changes."    Patient Stated Goals decrease pain and dizziness    Currently in Pain? Yes    Pain Score 5     Pain Location --   B knees, low back, upper shoulders                            [facility] Adult PT Treatment/Exercise - 01/06/21 0001      Exercises   Exercises Neck;Shoulder;Lumbar;Knee/Hip      Neck Exercises: Machines for Strengthening   Nustep L4x44mn      Neck Exercises: Seated   Shoulder Flexion Both;20 reps    Shoulder Flexion Limitations yellow TB with retracted scapulas 10 reps    Shoulder ABduction Both;20 reps    Shoulder Abduction Weights (lbs) yellow TB with retracted scapulas  Other Seated Exercise horizontal ABD with yellow TB 10 reps      Lumbar Exercises: Supine   Bridge with Ball Squeeze 20 reps;2 seconds    Other Supine Lumbar Exercises B marching with ball squeeze 2x5 rep with 3"      Knee/Hip Exercises: Supine   Heel Slides AAROM;10 reps;Both    Heel Slides Limitations 10x5" and strap    Straight Leg Raises Strengthening;Both;2 sets;10 reps                    PT Short Term Goals - 12/18/20 1707      PT SHORT TERM GOAL #1   Title Patient to be independent with initial HEP.    Time 3    Period Weeks    Status Achieved    Target Date 11/26/20             PT Long Term Goals - 12/29/20 1642      PT LONG TERM GOAL #1   Title Patient to be independent with advanced HEP.    Time 8    Period Weeks    Status Partially Met   met for current     PT LONG TERM GOAL #2   Title Patient  to demonstrate L UE strength >/=4+/5.    Time 8    Period Weeks    Status Partially Met   improved in all planes of L shoulder, in elbow flexion/extension, L wrist extension, and grip strength.     PT LONG TERM GOAL #3   Title Patient to demonstrate B LE strength >/=4+/5.    Time 8    Period Weeks    Status Partially Met   improvement in B hip flexion, abduction, adduction and L knee flexion/extension     PT LONG TERM GOAL #4   Title Patient to demonstrate cervical AROM WFL and without pain limiting.    Time 8    Period Weeks    Status On-going   improved in flexion, extension, and R sidebending     PT LONG TERM GOAL #5   Title Patient to report tolerance for kneeling to pray d/t improvement in pain levels.    Time 8    Period Weeks    Status On-going   attempted modifying this activity with addition of 2 folded pillows under knee- helps the R knee but still unable to tolerate on L knee     PT LONG TERM GOAL #6   Title Patient to report tolerance for 2 hours on his feet without pain limiting.    Time 8    Period Weeks    Status On-going   5-6 minutes     PT LONG TERM GOAL #7   Title Patient to report 90% improvement in dizziness.    Time 8    Period Weeks    Status Partially Met   70% improvement                Plan - 01/06/21 1621    Clinical Impression Statement Pt reports MD told him he had arthritis and mild tearing of her L meniscus but not significant enough for surgery. He notes that he is still having the same pain in his knees, low back, and neck. Progressed core stabilization exercises today, with pt showing mild difficulty with exercises shown with shaking and instability. Cues were required with every exercise to prevent compensations. Also did some UE strength focusing on good posture and TrA bracing  to stabilize spine. He notes that the KT tape from last session did not stay on very long and declined the estim and heat post session today. Pt still has  occasional episodes of dizziness when sitting upright from supine postion but does subside on its own.    Personal Factors and Comorbidities Age;Comorbidity 1;Fitness;Past/Current Experience;Time since onset of injury/illness/exacerbation    Comorbidities back surgery L3-4 1997    PT Frequency 2x / week    PT Duration 8 weeks    PT Treatment/Interventions ADLs/Self Care Home Management;Canalith Repostioning;Cryotherapy;Electrical Stimulation;Iontophoresis 36m/ml Dexamethasone;Moist Heat;Traction;Balance training;Therapeutic exercise;Therapeutic activities;Functional mobility training;Stair training;Gait training;Ultrasound;Neuromuscular re-education;Patient/family education;Manual techniques;Vestibular;Vasopneumatic Device;Taping;Energy conservation;Dry needling;Passive range of motion;Scar mobilization    PT Next Visit Plan progress ROM and strength; reassess R DH/sidelying test, progress VOR training    Consulted and Agree with Plan of Care Patient           Patient will benefit from skilled therapeutic intervention in order to improve the following deficits and impairments:  Abnormal gait,Hypomobility,Decreased activity tolerance,Decreased strength,Increased fascial restricitons,Impaired UE functional use,Pain,Difficulty walking,Decreased balance,Increased muscle spasms,Improper body mechanics,Dizziness,Decreased range of motion,Impaired flexibility,Postural dysfunction  Visit Diagnosis: Radiculopathy, cervical region  Muscle weakness (generalized)  Chronic pain of left knee  Acute pain of right knee  Bilateral low back pain, unspecified chronicity, unspecified whether sciatica present  Difficulty in walking, not elsewhere classified  Dizziness and giddiness     Problem List Patient Active Problem List   Diagnosis Date Noted  . Neck pain 11/13/2020  . Vertigo 11/13/2020  . Hypertension 11/03/2020  . Contusion of bone 10/23/2020  . Degenerative tear of left medial meniscus  10/23/2020  . Concussion with no loss of consciousness 10/23/2020  . Paroxysmal atrial fibrillation ([facility] 05/22/2019  . Unspecified atrial flutter ([facility] 05/22/2019  . Pain in right foot 03/30/2018  . Perennial and seasonal allergic rhinitis 11/09/2017  . History of food allergy 11/09/2017  . Wheezing 11/09/2017  . Dyspnea/wheezing 10/31/2017  . Cough 10/31/2017  . Bronchitis, mucopurulent recurrent ([place] 09/07/2017  . SVT (supraventricular tachycardia) ([facility] 04/23/2015  . Abdominal pain, epigastric 06/03/2013  . Unspecified vitamin D deficiency 04/09/2013  . Esophageal reflux 04/09/2013  . Hepatitis B carrier ([place] 04/09/2013  . Other and unspecified hyperlipidemia 04/09/2013  . Unspecified sleep apnea 04/09/2013    BArtist Pais PTA 01/06/2021, 4:27 PM  [facility] [place] [address]: [phone]  Fax:  [phone] Name: SYITZHAK AWANMRN: [id number] of Birth: 109/14/1957

## 2021-01-06 NOTE — Progress Notes (Signed)
Richard Walker - 65 y.o. male MRN [id number]  Date of birth: 12-20-1955  SUBJECTIVE:  Including CC & ROS.  No chief complaint on file.   Richard Walker is a 65 y.o. male that is following up after his MRI of his left knee.    Review of Systems See HPI   HISTORY: Past Medical, Surgical, Social, and Family History Reviewed & Updated per EMR.   Pertinent Historical Findings include:  Past Medical History:  Diagnosis Date  . DDD (degenerative disc disease), lumbar    L3/L4  . Diverticulosis   . GERD (gastroesophageal reflux disease)   . Hyperlipidemia   . Low sperm motility   . Pre-diabetes   . Sleep apnea    uses cpap   . SVT (supraventricular tachycardia) (HCC)     Past Surgical History:  Procedure Laterality Date  . 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 REPAIR OF LEFT INDEX AND LONG FINGER BY FUSION /  REPAIR OF RING FINGER;  Surgeon: Mack Hook, MD;  Location: [facility];  Service: Orthopedics;  Laterality: Left;  . BACK SURGERY    . CARDIAC CATHETERIZATION N/A 12/22/2015   Procedure: Left Heart Cath and Coronary Angiography;  Surgeon: Peter M Swaziland, MD;  Location[facility] INVASIVE [facility];  Service: Cardiovascular;  Laterality: N/A;  . CARDIAC ELECTROPHYSIOLOGY STUDY AND ABLATION    . ELECTROPHYSIOLOGIC STUDY N/A 02/19/2016   Procedure: SVT Ablation;  Surgeon: Marinus Maw, MD;  Location: [facility] INVASIVE CV LAB;  Service: Cardiovascular;  Laterality: N/A;  . septal reconstruction  2017  . TURBINATE REDUCTION Bilateral 07/2016  . WISDOM TOOTH EXTRACTION      Family History  Problem Relation Age of Onset  . Hypertension Father   . Food Allergy Son   . Colon cancer Neg Hx   . Stomach cancer Neg Hx   . Allergic rhinitis Neg Hx   . Angioedema Neg Hx   . Asthma Neg Hx   . Eczema Neg Hx   . Immunodeficiency Neg Hx   . Urticaria Neg Hx     Social History   Socioeconomic History  . Marital status: Married    Spouse name: Not on file  . Number of children: Not on file  .  Years of education: Not on file  . Highest education level: Not on file  Occupational History  . Not on file  Tobacco Use  . Smoking status: Former Smoker    Packs/day: 1.00    Years: 10.00    Pack years: 10.00    Types: Cigarettes    Quit date: 1992    Years since quitting: 30.3  . Smokeless tobacco: Never Used  . Tobacco comment: Uses Hookah Pipe occasionally  Vaping Use  . Vaping Use: Never used  Substance and Sexual Activity  . Alcohol use: No  . Drug use: No  . Sexual activity: Yes  Other Topics Concern  . Not on file  Social History Narrative   Marital Status: Married Engineer, drilling)   Children:  4 (3 Sons/1 Daughter)   Pets:  None    Living Situation: Lives with spouse and 4 children   Origin:  He was born in [place].   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     Alcohol Use:  None   Drug Use:  None   Diet:  Regular   Exercise:  None   Hobbies:  Cards   Social Determinants of Health   Financial Resource Strain: Not on file  Food Insecurity: Not on file  Transportation Needs: Not on file  Physical Activity: Not on file  Stress: Not on file  Social Connections: Not on file  Intimate Partner Violence: Not on file     PHYSICAL EXAM:  VS: BP (!) 148/90 (BP Location: Left Arm, Patient Position: Sitting, Cuff Size: Large)   Ht 5\' 9"  (1.753 m)   Wt 261 lb (118.4 kg)   BMI 38.54 kg/m  Physical Exam Gen: NAD, alert, cooperative with exam, well-appearing    ASSESSMENT & PLAN:   Degenerative tear of left medial meniscus MRI was demonstrating complex tearing of the medial meniscus and posterior horn.  Is also demonstrating degenerative changes of the medial compartment and patellofemoral joint. -Counseled on home exercise therapy and supportive care. -Continue physical therapy. -Could consider gel injection.  Vertigo Has gotten  improvement with physical therapy. -Continue physical therapy

## 2021-01-06 NOTE — Assessment & Plan Note (Signed)
MRI was demonstrating complex tearing of the medial meniscus and posterior horn.  Is also demonstrating degenerative changes of the medial compartment and patellofemoral joint. -Counseled on home exercise therapy and supportive care. -Continue physical therapy. -Could consider gel injection.

## 2021-01-08 ENCOUNTER — Other Ambulatory Visit: Payer: Self-pay

## 2021-01-08 ENCOUNTER — Ambulatory Visit: Payer: Medicare Other

## 2021-01-08 DIAGNOSIS — M25561 Pain in right knee: Secondary | ICD-10-CM

## 2021-01-08 DIAGNOSIS — G8929 Other chronic pain: Secondary | ICD-10-CM

## 2021-01-08 DIAGNOSIS — R42 Dizziness and giddiness: Secondary | ICD-10-CM

## 2021-01-08 DIAGNOSIS — M545 Low back pain, unspecified: Secondary | ICD-10-CM

## 2021-01-08 DIAGNOSIS — R262 Difficulty in walking, not elsewhere classified: Secondary | ICD-10-CM

## 2021-01-08 DIAGNOSIS — M5412 Radiculopathy, cervical region: Secondary | ICD-10-CM

## 2021-01-08 DIAGNOSIS — M6281 Muscle weakness (generalized): Secondary | ICD-10-CM

## 2021-01-08 NOTE — Therapy (Signed)
[facility] [place] [address]  [place], [place], [address] Phone: [phone]   Fax:  [phone]  Physical Therapy Treatment  Patient Details  Name: Richard Walker MRN: [id number] Date of Birth: 03/11/1956 Referring Provider (PT): Clearance Coots, MD   Encounter Date: 01/08/2021   PT End of Session - 01/08/21 1709    Visit Number 16    Number of Visits 26    Date for PT Re-Evaluation 02/12/21    Authorization Type Medicare & Traditional Medicaid    PT Start Time 5093    PT Stop Time 1657    PT Time Calculation (min) 41 min    Activity Tolerance Patient tolerated treatment well    Behavior During Therapy [facility] for tasks assessed/performed           Past Medical History:  Diagnosis Date  . DDD (degenerative disc disease), lumbar    L3/L4  . Diverticulosis   . GERD (gastroesophageal reflux disease)   . Hyperlipidemia   . Low sperm motility   . Pre-diabetes   . Sleep apnea    uses cpap   . SVT (supraventricular tachycardia) (HCC)     Past Surgical History:  Procedure Laterality Date  . 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 REPAIR OF LEFT INDEX AND LONG FINGER BY FUSION /  REPAIR OF RING FINGER;  Surgeon: Milly Jakob, MD;  Location: [facility];  Service: Orthopedics;  Laterality: Left;  . BACK SURGERY    . CARDIAC CATHETERIZATION N/A 12/22/2015   Procedure: Left Heart Cath and Coronary Angiography;  Surgeon: Peter M Martinique, MD;  Location: [facility];  Service: Cardiovascular;  Laterality: N/A;  . CARDIAC ELECTROPHYSIOLOGY STUDY AND ABLATION    . ELECTROPHYSIOLOGIC STUDY N/A 02/19/2016   Procedure: SVT Ablation;  Surgeon: Evans Lance, MD;  Location: [facility];  Service: Cardiovascular;  Laterality: N/A;  . septal reconstruction  2017  . TURBINATE REDUCTION Bilateral 07/2016  . WISDOM TOOTH EXTRACTION      There were no vitals filed for this visit.   Subjective Assessment - 01/08/21 1618     Subjective Pt is doing better since he took Advil at 3am this morning.    Pertinent History none    Diagnostic tests Per MD- "Independent review of the CT lumbar spine from 2/15 shows stable interbody fusion at L4/5 and degenerative disc disease at L5-S1.    Independent review of the CT cervical spine 2/15 shows severe degenerative change at C5-6.    Independent review the B knee x-ray from 2/15 shows no acute changes."    Patient Stated Goals decrease pain and dizziness    Currently in Pain? Yes    Pain Score 4     Pain Orientation Right;Left;Anterior    Pain Descriptors / Indicators Aching    Pain Type Acute pain    Pain Score 4    Pain Location Back    Pain Orientation Right;Left;Mid    Pain Descriptors / Indicators Aching    Pain Type Chronic pain    Pain Score 3    Pain Location Shoulder    Pain Orientation Lower    Pain Descriptors / Indicators Sharp    Pain Type Acute pain                             [facility] Adult PT Treatment/Exercise - 01/08/21 0001  Neck Exercises: Seated   Other Seated Exercise horizontal ABD with red TB 10 reps      Lumbar Exercises: Seated   Other Seated Lumbar Exercises seated shoulder flexion stretch 5x10" with chair      Lumbar Exercises: Supine   Other Supine Lumbar Exercises B shld flexion with hips/knees 90/90 and ball squeeze 2x10      Knee/Hip Exercises: Seated   Knee/Hip Flexion knee flexion AAROM with R foot in front 10 reps    Sit to Sand 10 reps;without UE support   cueing to increase fwd WS     Knee/Hip Exercises: Supine   Bridges Strengthening;Both;2 sets;10 reps    Straight Leg Raises Strengthening;Both;2 sets;10 reps                    PT Short Term Goals - 12/18/20 1707      PT SHORT TERM GOAL #1   Title Patient to be independent with initial HEP.    Time 3    Period Weeks    Status Achieved    Target Date 11/26/20             PT Long Term Goals - 12/29/20 1642      PT LONG TERM  GOAL #1   Title Patient to be independent with advanced HEP.    Time 8    Period Weeks    Status Partially Met   met for current     PT LONG TERM GOAL #2   Title Patient to demonstrate L UE strength >/=4+/5.    Time 8    Period Weeks    Status Partially Met   improved in all planes of L shoulder, in elbow flexion/extension, L wrist extension, and grip strength.     PT LONG TERM GOAL #3   Title Patient to demonstrate B LE strength >/=4+/5.    Time 8    Period Weeks    Status Partially Met   improvement in B hip flexion, abduction, adduction and L knee flexion/extension     PT LONG TERM GOAL #4   Title Patient to demonstrate cervical AROM WFL and without pain limiting.    Time 8    Period Weeks    Status On-going   improved in flexion, extension, and R sidebending     PT LONG TERM GOAL #5   Title Patient to report tolerance for kneeling to pray d/t improvement in pain levels.    Time 8    Period Weeks    Status On-going   attempted modifying this activity with addition of 2 folded pillows under knee- helps the R knee but still unable to tolerate on L knee     PT LONG TERM GOAL #6   Title Patient to report tolerance for 2 hours on his feet without pain limiting.    Time 8    Period Weeks    Status On-going   5-6 minutes     PT LONG TERM GOAL #7   Title Patient to report 90% improvement in dizziness.    Time 8    Period Weeks    Status Partially Met   70% improvement                Plan - 01/08/21 1710    Clinical Impression Statement Pt reports that after taking an Advil at 3 am this morning he has been doing better with less pain today. Reviewed STS form with him to reduce stress on  the knees, spent a lot of time instructing him through this. Gave instruction to increase ant WS and use moment of the hips for stance. Also progressed core stabilization exercises today, focusing on increasing muscle recruitment for core muscles and increased need for stabilization. Pt  responded well only noting increased pulling in the core muscles. He also continues to note pain across the patellar tendons in both knees when going into full flexion. Just advised him to avoid any painful ROM when doing exercises to prevent any flare ups within the knee joint.    Personal Factors and Comorbidities Age;Comorbidity 1;Fitness;Past/Current Experience;Time since onset of injury/illness/exacerbation    Comorbidities back surgery L3-4 1997    PT Frequency 2x / week    PT Duration 8 weeks    PT Treatment/Interventions ADLs/Self Care Home Management;Canalith Repostioning;Cryotherapy;Electrical Stimulation;Iontophoresis 72m/ml Dexamethasone;Moist Heat;Traction;Balance training;Therapeutic exercise;Therapeutic activities;Functional mobility training;Stair training;Gait training;Ultrasound;Neuromuscular re-education;Patient/family education;Manual techniques;Vestibular;Vasopneumatic Device;Taping;Energy conservation;Dry needling;Passive range of motion;Scar mobilization    PT Next Visit Plan progress ROM and strength; reassess R DH/sidelying test, progress VOR training    Consulted and Agree with Plan of Care Patient           Patient will benefit from skilled therapeutic intervention in order to improve the following deficits and impairments:  Abnormal gait,Hypomobility,Decreased activity tolerance,Decreased strength,Increased fascial restricitons,Impaired UE functional use,Pain,Difficulty walking,Decreased balance,Increased muscle spasms,Improper body mechanics,Dizziness,Decreased range of motion,Impaired flexibility,Postural dysfunction  Visit Diagnosis: Radiculopathy, cervical region  Muscle weakness (generalized)  Chronic pain of left knee  Acute pain of right knee  Bilateral low back pain, unspecified chronicity, unspecified whether sciatica present  Difficulty in walking, not elsewhere classified  Dizziness and giddiness     Problem List Patient Active Problem List    Diagnosis Date Noted  . Neck pain 11/13/2020  . Vertigo 11/13/2020  . Hypertension 11/03/2020  . Contusion of bone 10/23/2020  . Degenerative tear of left medial meniscus 10/23/2020  . Concussion with no loss of consciousness 10/23/2020  . Paroxysmal atrial fibrillation ([place] 05/22/2019  . Unspecified atrial flutter ([place] 05/22/2019  . Pain in right foot 03/30/2018  . Perennial and seasonal allergic rhinitis 11/09/2017  . History of food allergy 11/09/2017  . Wheezing 11/09/2017  . Dyspnea/wheezing 10/31/2017  . Cough 10/31/2017  . Bronchitis, mucopurulent recurrent ([place] 09/07/2017  . SVT (supraventricular tachycardia) ([place] 04/23/2015  . Abdominal pain, epigastric 06/03/2013  . Unspecified vitamin D deficiency 04/09/2013  . Esophageal reflux 04/09/2013  . Hepatitis B carrier ([place] 04/09/2013  . Other and unspecified hyperlipidemia 04/09/2013  . Unspecified sleep apnea 04/09/2013    BArtist Pais PTA 01/08/2021, 5:19 PM  [facility] [place] [place] [address]Phone: [phone]  Fax:  [phone] Name: Richard DETTOREMRN: [id number] of Birth: 106-25-1957

## 2021-01-12 ENCOUNTER — Other Ambulatory Visit: Payer: Self-pay

## 2021-01-12 ENCOUNTER — Ambulatory Visit: Payer: Medicare Other | Admitting: Physical Therapy

## 2021-01-12 ENCOUNTER — Encounter: Payer: Self-pay | Admitting: Physical Therapy

## 2021-01-12 DIAGNOSIS — M6281 Muscle weakness (generalized): Secondary | ICD-10-CM

## 2021-01-12 DIAGNOSIS — M25561 Pain in right knee: Secondary | ICD-10-CM

## 2021-01-12 DIAGNOSIS — R262 Difficulty in walking, not elsewhere classified: Secondary | ICD-10-CM

## 2021-01-12 DIAGNOSIS — M5412 Radiculopathy, cervical region: Secondary | ICD-10-CM

## 2021-01-12 DIAGNOSIS — M545 Low back pain, unspecified: Secondary | ICD-10-CM

## 2021-01-12 DIAGNOSIS — G8929 Other chronic pain: Secondary | ICD-10-CM

## 2021-01-12 DIAGNOSIS — R42 Dizziness and giddiness: Secondary | ICD-10-CM

## 2021-01-12 NOTE — Therapy (Signed)
[facility] [address]  [address] [place], [place], [address] Phone: [phone]   Fax:  [phone]  Physical Therapy Treatment  Patient Details  Name: Richard Walker MRN: [id number] Date of Birth: 07/01/1956 Referring Provider (PT): Jeremy Schmitz, MD   Encounter Date: 01/12/2021   PT End of Session - 01/12/21 1145    Visit Number 17    Number of Visits 26    Date for PT Re-Evaluation 02/12/21    Authorization Type Medicare & Traditional Medicaid    PT Start Time 1100    PT Stop Time 1143    PT Time Calculation (min) 43 min    Activity Tolerance Patient tolerated treatment well;Patient limited by pain    Behavior During Therapy WFL for tasks assessed/performed           Past Medical History:  Diagnosis Date  . DDD (degenerative disc disease), lumbar    L3/L4  . Diverticulosis   . GERD (gastroesophageal reflux disease)   . Hyperlipidemia   . Low sperm motility   . Pre-diabetes   . Sleep apnea    uses cpap   . SVT (supraventricular tachycardia) (HCC)     Past Surgical History:  Procedure Laterality Date  . 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 REPAIR OF LEFT INDEX AND LONG FINGER BY FUSION /  REPAIR OF RING FINGER;  Surgeon: Thompson, David, MD;  [facility];  Service: Orthopedics;  Laterality: Left;  . BACK SURGERY    . CARDIAC CATHETERIZATION N/A 12/22/2015   Procedure: Left Heart Cath and Coronary Angiography;  Surgeon: Peter M Jordan, MD;  [facility]: [facility];  Service: Cardiovascular;  Laterality: N/A;  . CARDIAC ELECTROPHYSIOLOGY STUDY AND ABLATION    . ELECTROPHYSIOLOGIC STUDY N/A 02/19/2016   Procedure: SVT Ablation;  Surgeon: Gregg W Taylor, MD;  [facility];  Service: Cardiovascular;  Laterality: N/A;  . septal reconstruction  2017  . TURBINATE REDUCTION Bilateral 07/2016  . WISDOM TOOTH EXTRACTION      There were no vitals filed for this visit.   Subjective  Assessment - 01/12/21 1103    Subjective Has some questions about his previous knee US. Feels like the pain in his R knee increasing.    Pertinent History none    Diagnostic tests Per MD- "Independent review of the CT lumbar spine from 2/15 shows stable interbody fusion at L4/5 and degenerative disc disease at L5-S1.    Independent review of the CT cervical spine 2/15 shows severe degenerative change at C5-6.    Independent review the B knee x-ray from 2/15 shows no acute changes."    Patient Stated Goals decrease pain and dizziness    Currently in Pain? Yes    Pain Score 5    L 4/10, R 5/10   Pain Location Knee    Pain Orientation Right;Left;Anterior    Pain Descriptors / Indicators Aching    Pain Type Acute pain    Pain Score 5    Pain Location Back    Pain Orientation Right;Left;Lower    Pain Descriptors / Indicators Aching    Pain Type Chronic pain                             [facility] Adult PT Treatment/Exercise - 01/12/21 0001      Knee/Hip Exercises: Aerobic   Nustep L5 x 6 min (UEs/LEs)        Knee/Hip Exercises: Standing   Terminal Knee Extension Strengthening;Right;Left;1 set;10 reps    Terminal Knee Extension Limitations 10x3" into ball with 1 UE support    Hip Abduction Stengthening;Right;Left;1 set;10 reps;Knee straight    Abduction Limitations yellow loop around ankles; 2 ski poles   c/o R medial knee pain   Hip Extension Stengthening;Right;Left;1 set;10 reps;Knee straight    Extension Limitations yellow loop around ankles; 2 ski poles   c/o R medial knee pain     Knee/Hip Exercises: Seated   Sit to Sand 10 reps;without UE support;2 sets   sitting on airex; 2nd set holding orange medball; cues to lean chest forward     Knee/Hip Exercises: Sidelying   Hip ABduction Strengthening;Right;Left;1 set;10 reps    Hip ABduction Limitations alignment adjusted    Hip ADduction Strengthening;Right;Left;1 set;10 reps    Hip ADduction Limitations opposite LE  elevated on bolster                  PT Education - 01/12/21 1144    Education Details update to HEP    Person(s) Educated Patient    Methods Explanation;Demonstration;Tactile cues;Verbal cues;Handout    Comprehension Verbalized understanding;Returned demonstration            PT Short Term Goals - 12/18/20 1707      PT SHORT TERM GOAL #1   Title Patient to be independent with initial HEP.    Time 3    Period Weeks    Status Achieved    Target Date 11/26/20             PT Long Term Goals - 12/29/20 1642      PT LONG TERM GOAL #1   Title Patient to be independent with advanced HEP.    Time 8    Period Weeks    Status Partially Met   met for current     PT LONG TERM GOAL #2   Title Patient to demonstrate L UE strength >/=4+/5.    Time 8    Period Weeks    Status Partially Met   improved in all planes of L shoulder, in elbow flexion/extension, L wrist extension, and grip strength.     PT LONG TERM GOAL #3   Title Patient to demonstrate B LE strength >/=4+/5.    Time 8    Period Weeks    Status Partially Met   improvement in B hip flexion, abduction, adduction and L knee flexion/extension     PT LONG TERM GOAL #4   Title Patient to demonstrate cervical AROM WFL and without pain limiting.    Time 8    Period Weeks    Status On-going   improved in flexion, extension, and R sidebending     PT LONG TERM GOAL #5   Title Patient to report tolerance for kneeling to pray d/t improvement in pain levels.    Time 8    Period Weeks    Status On-going   attempted modifying this activity with addition of 2 folded pillows under knee- helps the R knee but still unable to tolerate on L knee     PT LONG TERM GOAL #6   Title Patient to report tolerance for 2 hours on his feet without pain limiting.    Time 8    Period Weeks    Status On-going   5-6 minutes     PT LONG TERM GOAL #7   Title Patient to report 90% improvement in dizziness.      Time 8    Period Weeks     Status Partially Met   70% improvement                Plan - 01/12/21 1149    Clinical Impression Statement Patient arrived to session with some questions about his previous knee [place]. Feels concerned as his L knee has been hurting more recently. Discussed patient's MSK [place] of B knees from 10/23/20 and answered patient's questions. Worked on STS transfers without UE support and with cueing to increase anterior weight shift. Patient tolerated this well with elevated seat. TKE was performed with c/o pain in the R>L knee. Performed standing hip strengthening ther-ex with c/o R medial knee pain both with WBing on this side and when R LE offloaded. Instead worked on [employer] officer, [employer] which was better-tolerated. Updated these exercises into HEP. Patient reported understanding and without complaints at end of session.    Personal Factors and Comorbidities Age;Comorbidity 1;Fitness;Past/Current Experience;Time since onset of injury/illness/exacerbation    Comorbidities back surgery L3-4 1997    PT Frequency 2x / week    PT Duration 8 weeks    PT Treatment/Interventions ADLs/Self Care Home Management;Canalith Repostioning;Cryotherapy;Electrical Stimulation;Iontophoresis 39m/ml Dexamethasone;Moist Heat;Traction;Balance training;Therapeutic exercise;Therapeutic activities;Functional mobility training;Stair training;Gait training;Ultrasound;Neuromuscular re-education;Patient/family education;Manual techniques;Vestibular;Vasopneumatic Device;Taping;Energy conservation;Dry needling;Passive range of motion;Scar mobilization    PT Next Visit Plan progress ROM and strength; reassess R DH/sidelying test, progress VOR training    Consulted and Agree with Plan of Care Patient           Patient will benefit from skilled therapeutic intervention in order to improve the following deficits and impairments:  Abnormal gait,Hypomobility,Decreased activity tolerance,Decreased strength,Increased fascial  restricitons,Impaired UE functional use,Pain,Difficulty walking,Decreased balance,Increased muscle spasms,Improper body mechanics,Dizziness,Decreased range of motion,Impaired flexibility,Postural dysfunction  Visit Diagnosis: Radiculopathy, cervical region  Muscle weakness (generalized)  Chronic pain of left knee  Acute pain of right knee  Bilateral low back pain, unspecified chronicity, unspecified whether sciatica present  Difficulty in walking, not elsewhere classified  Dizziness and giddiness     Problem List Patient Active Problem List   Diagnosis Date Noted  . Neck pain 11/13/2020  . Vertigo 11/13/2020  . Hypertension 11/03/2020  . Contusion of bone 10/23/2020  . Degenerative tear of left medial meniscus 10/23/2020  . Concussion with no loss of consciousness 10/23/2020  . Paroxysmal atrial fibrillation ([place] 05/22/2019  . Unspecified atrial flutter ([place] 05/22/2019  . Pain in right foot 03/30/2018  . Perennial and seasonal allergic rhinitis 11/09/2017  . History of food allergy 11/09/2017  . Wheezing 11/09/2017  . Dyspnea/wheezing 10/31/2017  . Cough 10/31/2017  . Bronchitis, mucopurulent recurrent ([facility] 09/07/2017  . SVT (supraventricular tachycardia) ([facility] 04/23/2015  . Abdominal pain, epigastric 06/03/2013  . Unspecified vitamin D deficiency 04/09/2013  . Esophageal reflux 04/09/2013  . Hepatitis B carrier ([place] 04/09/2013  . Other and unspecified hyperlipidemia 04/09/2013  . Unspecified sleep apnea 04/09/2013     YJanene Harvey PT, DPT 01/12/21 11:54 AM   [facility] [address]Phone: [phone]  Fax:  [phone] Name: SDEMARKO ZEIMETMRN: [id number] of Birth: [birth year]

## 2021-01-14 ENCOUNTER — Ambulatory Visit: Payer: Medicare Other

## 2021-01-14 ENCOUNTER — Other Ambulatory Visit: Payer: Self-pay

## 2021-01-14 DIAGNOSIS — M5412 Radiculopathy, cervical region: Secondary | ICD-10-CM | POA: Diagnosis not present

## 2021-01-14 DIAGNOSIS — R262 Difficulty in walking, not elsewhere classified: Secondary | ICD-10-CM

## 2021-01-14 DIAGNOSIS — M545 Low back pain, unspecified: Secondary | ICD-10-CM

## 2021-01-14 DIAGNOSIS — G8929 Other chronic pain: Secondary | ICD-10-CM

## 2021-01-14 DIAGNOSIS — M6281 Muscle weakness (generalized): Secondary | ICD-10-CM

## 2021-01-14 DIAGNOSIS — M25561 Pain in right knee: Secondary | ICD-10-CM

## 2021-01-14 DIAGNOSIS — R42 Dizziness and giddiness: Secondary | ICD-10-CM

## 2021-01-14 NOTE — Therapy (Signed)
[facility] [address]  [place], [place], [address] Phone: [phone]   Fax:  [phone]  Physical Therapy Treatment  Patient Details  Name: Richard Walker MRN: [id number] Date of Birth: 1956/01/30 Referring Provider (PT): Clearance Coots, MD   Encounter Date: 01/14/2021   PT End of Session - 01/14/21 1158    Visit Number 18    Number of Visits 26    Date for PT Re-Evaluation 02/12/21    Authorization Type Medicare & Traditional Medicaid    PT Start Time 1101    PT Stop Time 1145    PT Time Calculation (min) 44 min    Activity Tolerance Patient tolerated treatment well;Patient limited by pain    Behavior During Therapy [facility] for tasks assessed/performed           Past Medical History:  Diagnosis Date  . DDD (degenerative disc disease), lumbar    L3/L4  . Diverticulosis   . GERD (gastroesophageal reflux disease)   . Hyperlipidemia   . Low sperm motility   . Pre-diabetes   . Sleep apnea    uses cpap   . SVT (supraventricular tachycardia) (HCC)     Past Surgical History:  Procedure Laterality Date  . 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 REPAIR OF LEFT INDEX AND LONG FINGER BY FUSION /  REPAIR OF RING FINGER;  Surgeon: Milly Jakob, MD;  Location: [facility];  Service: Orthopedics;  Laterality: Left;  . BACK SURGERY    . CARDIAC CATHETERIZATION N/A 12/22/2015   Procedure: Left Heart Cath and Coronary Angiography;  Surgeon: Peter M Martinique, MD;  Location: [place] [facility];  Service: Cardiovascular;  Laterality: N/A;  . CARDIAC ELECTROPHYSIOLOGY STUDY AND ABLATION    . ELECTROPHYSIOLOGIC STUDY N/A 02/19/2016   Procedure: SVT Ablation;  Surgeon: Evans Lance, MD;  Location: [place] [facility];  Service: Cardiovascular;  Laterality: N/A;  . septal reconstruction  2017  . TURBINATE REDUCTION Bilateral 07/2016  . WISDOM TOOTH EXTRACTION      There were no vitals filed for this visit.   Subjective  Assessment - 01/14/21 1105    Subjective Knee is doing better than last week. Back and shoulder are about the same.    Pertinent History none    Diagnostic tests Per MD- "Independent review of the CT lumbar spine from 2/15 shows stable interbody fusion at L4/5 and degenerative disc disease at L5-S1.    Independent review of the CT cervical spine 2/15 shows severe degenerative change at C5-6.    Independent review the B knee x-ray from 2/15 shows no acute changes."    Patient Stated Goals decrease pain and dizziness    Currently in Pain? Yes    Pain Score 5     Pain Location Knee    Pain Orientation Right;Left;Anterior    Pain Descriptors / Indicators Aching    Pain Type Acute pain    Pain Score 5    Pain Location Back    Pain Orientation Right;Left;Lower    Pain Descriptors / Indicators Aching    Pain Type Chronic pain    Pain Score 3    Pain Location Shoulder    Pain Orientation Lower    Pain Descriptors / Indicators Sharp    Pain Type Acute pain  [facility] PT Treatment/Exercise - 01/14/21 0001      Knee/Hip Exercises: Standing   Hip Flexion Stengthening;Both;10 reps;Knee straight    Hip Flexion Limitations red TB with counter assist    Hip Abduction Stengthening;Both;10 reps;Knee straight    Abduction Limitations red TB with counter assist    Hip Extension Stengthening;Both;10 reps;Knee straight    Extension Limitations red TB with counter assist    Other Standing Knee Exercises side stepping with red TB 3x along the counter      Knee/Hip Exercises: Supine   Bridges Strengthening;Both;2 sets;10 reps    Bridges Limitations red TB    Straight Leg Raises Strengthening;Both;10 reps    Straight Leg Raises Limitations 10x2" B LE      Knee/Hip Exercises: Sidelying   Hip ABduction Strengthening;Both;10 reps    Hip ABduction Limitations cueing to remain on side    Other Sidelying Knee/Hip Exercises B fire hydrants; L side with 2#  weights, R side w/o weight 10 reps      Shoulder Exercises: Standing   Flexion Strengthening;Both;10 reps;Weights    Shoulder Flexion Weight (lbs) 2    Flexion Limitations pool noodle in between shoulder blades    ABduction Strengthening;Both;10 reps;Weights    Shoulder ABduction Weight (lbs) 2    ABduction Limitations pool noodle in between scapula    Other Standing Exercises standing snow angel with pool noodle, 2# weights  10 reps B      Shoulder Exercises: ROM/Strengthening   Cybex Row Limitations 25# 10 reps B UE                    PT Short Term Goals - 12/18/20 1707      PT SHORT TERM GOAL #1   Title Patient to be independent with initial HEP.    Time 3    Period Weeks    Status Achieved    Target Date 11/26/20             PT Long Term Goals - 12/29/20 1642      PT LONG TERM GOAL #1   Title Patient to be independent with advanced HEP.    Time 8    Period Weeks    Status Partially Met   met for current     PT LONG TERM GOAL #2   Title Patient to demonstrate L UE strength >/=4+/5.    Time 8    Period Weeks    Status Partially Met   improved in all planes of L shoulder, in elbow flexion/extension, L wrist extension, and grip strength.     PT LONG TERM GOAL #3   Title Patient to demonstrate B LE strength >/=4+/5.    Time 8    Period Weeks    Status Partially Met   improvement in B hip flexion, abduction, adduction and L knee flexion/extension     PT LONG TERM GOAL #4   Title Patient to demonstrate cervical AROM WFL and without pain limiting.    Time 8    Period Weeks    Status On-going   improved in flexion, extension, and R sidebending     PT LONG TERM GOAL #5   Title Patient to report tolerance for kneeling to pray d/t improvement in pain levels.    Time 8    Period Weeks    Status On-going   attempted modifying this activity with addition of 2 folded pillows under knee- helps the R knee but still unable to tolerate  on L knee     PT LONG  TERM GOAL #6   Title Patient to report tolerance for 2 hours on his feet without pain limiting.    Time 8    Period Weeks    Status On-going   5-6 minutes     PT LONG TERM GOAL #7   Title Patient to report 90% improvement in dizziness.    Time 8    Period Weeks    Status Partially Met   70% improvement                Plan - 01/14/21 1200    Clinical Impression Statement Pt feels some improvement in his R knee from last session. Contined to work on hip strengthening today to take pressure off of the knees. He did report some increased pain with the standing hip exercises d/t standing on his R LE knee. Also tried mini squats at counter, he still has a considerable amount of pain so deffered these. Also incorporated core strengthening exercises, with reports of him feeling "a lot of tension" in his low back and abdominals. Also incorporated more postural exercises to session. Pt responded well.    Personal Factors and Comorbidities Age;Comorbidity 1;Fitness;Past/Current Experience;Time since onset of injury/illness/exacerbation    Comorbidities back surgery L3-4 1997    PT Frequency 2x / week    PT Duration 8 weeks    PT Treatment/Interventions ADLs/Self Care Home Management;Canalith Repostioning;Cryotherapy;Electrical Stimulation;Iontophoresis 24m/ml Dexamethasone;Moist Heat;Traction;Balance training;Therapeutic exercise;Therapeutic activities;Functional mobility training;Stair training;Gait training;Ultrasound;Neuromuscular re-education;Patient/family education;Manual techniques;Vestibular;Vasopneumatic Device;Taping;Energy conservation;Dry needling;Passive range of motion;Scar mobilization    PT Next Visit Plan progress ROM and strength; reassess R DH/sidelying test, progress VOR training    Consulted and Agree with Plan of Care Patient           Patient will benefit from skilled therapeutic intervention in order to improve the following deficits and impairments:  Abnormal  gait,Hypomobility,Decreased activity tolerance,Decreased strength,Increased fascial restricitons,Impaired UE functional use,Pain,Difficulty walking,Decreased balance,Increased muscle spasms,Improper body mechanics,Dizziness,Decreased range of motion,Impaired flexibility,Postural dysfunction  Visit Diagnosis: Radiculopathy, cervical region  Muscle weakness (generalized)  Chronic pain of left knee  Acute pain of right knee  Bilateral low back pain, unspecified chronicity, unspecified whether sciatica present  Difficulty in walking, not elsewhere classified  Dizziness and giddiness     Problem List Patient Active Problem List   Diagnosis Date Noted  . Neck pain 11/13/2020  . Vertigo 11/13/2020  . Hypertension 11/03/2020  . Contusion of bone 10/23/2020  . Degenerative tear of left medial meniscus 10/23/2020  . Concussion with no loss of consciousness 10/23/2020  . Paroxysmal atrial fibrillation ([place] 05/22/2019  . Unspecified atrial flutter ([place] 05/22/2019  . Pain in right foot 03/30/2018  . Perennial and seasonal allergic rhinitis 11/09/2017  . History of food allergy 11/09/2017  . Wheezing 11/09/2017  . Dyspnea/wheezing 10/31/2017  . Cough 10/31/2017  . Bronchitis, mucopurulent recurrent ([place] 09/07/2017  . SVT (supraventricular tachycardia) ([facility] 04/23/2015  . Abdominal pain, epigastric 06/03/2013  . Unspecified vitamin D deficiency 04/09/2013  . Esophageal reflux 04/09/2013  . Hepatitis B carrier ([place] 04/09/2013  . Other and unspecified hyperlipidemia 04/09/2013  . Unspecified sleep apnea 04/09/2013    BArtist Pais PTA 01/14/2021, 12:08 PM  [facility] [place] [address]Phone: [phone]  Fax:  [phone] Name: SBUCKLEY BRADLYMRN: [id number] of Birth: 106-23-1957

## 2021-01-20 ENCOUNTER — Other Ambulatory Visit: Payer: Self-pay

## 2021-01-20 ENCOUNTER — Ambulatory Visit: Payer: Medicare Other | Attending: Family Medicine | Admitting: Physical Therapy

## 2021-01-20 ENCOUNTER — Encounter: Payer: Self-pay | Admitting: Physical Therapy

## 2021-01-20 DIAGNOSIS — M5412 Radiculopathy, cervical region: Secondary | ICD-10-CM | POA: Diagnosis not present

## 2021-01-20 DIAGNOSIS — M25562 Pain in left knee: Secondary | ICD-10-CM | POA: Insufficient documentation

## 2021-01-20 DIAGNOSIS — M545 Low back pain, unspecified: Secondary | ICD-10-CM | POA: Insufficient documentation

## 2021-01-20 DIAGNOSIS — R262 Difficulty in walking, not elsewhere classified: Secondary | ICD-10-CM | POA: Insufficient documentation

## 2021-01-20 DIAGNOSIS — M25561 Pain in right knee: Secondary | ICD-10-CM | POA: Insufficient documentation

## 2021-01-20 DIAGNOSIS — R42 Dizziness and giddiness: Secondary | ICD-10-CM | POA: Insufficient documentation

## 2021-01-20 DIAGNOSIS — G8929 Other chronic pain: Secondary | ICD-10-CM | POA: Insufficient documentation

## 2021-01-20 DIAGNOSIS — M6281 Muscle weakness (generalized): Secondary | ICD-10-CM | POA: Insufficient documentation

## 2021-01-20 NOTE — Therapy (Signed)
[facility] [address]  [place], [place], [address] Phone: [phone]   Fax:  [phone]  Physical Therapy Treatment  Patient Details  Name: Richard Walker MRN: [id number] Date of Birth: [birth year] Referring Provider (PT): Clearance Coots, MD   Encounter Date: 01/20/2021   PT End of Session - 01/20/21 1058    Visit Number 19    Number of Visits 26    Date for PT Re-Evaluation 02/12/21    Authorization Type Medicare & Traditional Medicaid    PT Start Time 1014    PT Stop Time 1055    PT Time Calculation (min) 41 min    Activity Tolerance Patient tolerated treatment well;Patient limited by pain    Behavior During Therapy [facility] for tasks assessed/performed           Past Medical History:  Diagnosis Date  . DDD (degenerative disc disease), lumbar    L3/L4  . Diverticulosis   . GERD (gastroesophageal reflux disease)   . Hyperlipidemia   . Low sperm motility   . Pre-diabetes   . Sleep apnea    uses cpap   . SVT (supraventricular tachycardia) (HCC)     Past Surgical History:  Procedure Laterality Date  . 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 REPAIR OF LEFT INDEX AND LONG FINGER BY FUSION /  REPAIR OF RING FINGER;  Surgeon: Milly Jakob, MD;  Location: [place];  Service: Orthopedics;  Laterality: Left;  . BACK SURGERY    . CARDIAC CATHETERIZATION N/A 12/22/2015   Procedure: Left Heart Cath and Coronary Angiography;  Surgeon: Peter M Martinique, MD;  Location: [place] [facility];  Service: Cardiovascular;  Laterality: N/A;  . CARDIAC ELECTROPHYSIOLOGY STUDY AND ABLATION    . ELECTROPHYSIOLOGIC STUDY N/A 02/19/2016   Procedure: SVT Ablation;  Surgeon: Evans Lance, MD;  Location: [place] [facility];  Service: Cardiovascular;  Laterality: N/A;  . septal reconstruction  2017  . TURBINATE REDUCTION Bilateral 07/2016  . WISDOM TOOTH EXTRACTION      There were no vitals filed for this visit.   Subjective  Assessment - 01/20/21 1016    Subjective Feeling better since last week but still having more pain in the R>L knee.    Pertinent History none    Diagnostic tests Per MD- "Independent review of the CT lumbar spine from 2/15 shows stable interbody fusion at L4/5 and degenerative disc disease at L5-S1.    Independent review of the CT cervical spine 2/15 shows severe degenerative change at C5-6.    Independent review the B knee x-ray from 2/15 shows no acute changes."    Patient Stated Goals decrease pain and dizziness    Currently in Pain? Yes    Pain Score 5     Pain Location Knee    Pain Orientation Left;Right    Pain Descriptors / Indicators Aching    Pain Type Acute pain                             [facility] Adult PT Treatment/Exercise - 01/20/21 0001      Lumbar Exercises: Aerobic   Nustep L5 x 6 min (UEs/LEs)      Knee/Hip Exercises: Standing   Forward Step Up Right;Left;1 set;10 reps;Hand Hold: 1;Step Height: 6"    Forward Step Up Limitations R/L step up/back 5x each, R/L step up/over 5x each   R step up/over with  4" d/t pain   Other Standing Knee Exercises side stepping with red TB 4x 108f      Knee/Hip Exercises: Seated   Sit to Sand 10 reps;without UE support;2 sets   elevated on 1 airex, holding orange medball     Knee/Hip Exercises: Sidelying   Hip ABduction Strengthening;Both;10 reps    Hip ABduction Limitations 2#; cues for alignment    Hip ADduction Strengthening;Right;Left;1 set;10 reps    Hip ADduction Limitations 2#; opposite LE elevated on bolster    Clams 2x10 each with red TB      Iontophoresis   Type of Iontophoresis Dexamethasone    Location R medial knee    Dose 862mminutes, 1.0 ml    Time 4 hour patch                  PT Education - 01/20/21 1055    Education Details reminder on ionto wear time and precautions    Person(s) Educated Patient    Methods Explanation    Comprehension Verbalized understanding            PT  Short Term Goals - 12/18/20 1707      PT SHORT TERM GOAL #1   Title Patient to be independent with initial HEP.    Time 3    Period Weeks    Status Achieved    Target Date 11/26/20             PT Long Term Goals - 12/29/20 1642      PT LONG TERM GOAL #1   Title Patient to be independent with advanced HEP.    Time 8    Period Weeks    Status Partially Met   met for current     PT LONG TERM GOAL #2   Title Patient to demonstrate L UE strength >/=4+/5.    Time 8    Period Weeks    Status Partially Met   improved in all planes of L shoulder, in elbow flexion/extension, L wrist extension, and grip strength.     PT LONG TERM GOAL #3   Title Patient to demonstrate B LE strength >/=4+/5.    Time 8    Period Weeks    Status Partially Met   improvement in B hip flexion, abduction, adduction and L knee flexion/extension     PT LONG TERM GOAL #4   Title Patient to demonstrate cervical AROM WFL and without pain limiting.    Time 8    Period Weeks    Status On-going   improved in flexion, extension, and R sidebending     PT LONG TERM GOAL #5   Title Patient to report tolerance for kneeling to pray d/t improvement in pain levels.    Time 8    Period Weeks    Status On-going   attempted modifying this activity with addition of 2 folded pillows under knee- helps the R knee but still unable to tolerate on L knee     PT LONG TERM GOAL #6   Title Patient to report tolerance for 2 hours on his feet without pain limiting.    Time 8    Period Weeks    Status On-going   5-6 minutes     PT LONG TERM GOAL #7   Title Patient to report 90% improvement in dizziness.    Time 8    Period Weeks    Status Partially Met   70% improvement  Plan - 01/20/21 1058    Clinical Impression Statement Patient arrived to session with report of improved pain in B knees since last week, however still experiences R>L knee pain. Progressive hip strengthening ther-ex was performed  with patient noting mild R knee pain but overall with good tolerance. Worked on STS transfers with cueing to avoid excessive use of R LE to avoid exacerbation of knee pain. Step up/downs were performed with cueing to decrease eccentric speed and encourage L heel touch. Slightly shorter step was used on R today d/t knee pain. Patient today with c/o short duration of mild dizziness upon laying on R side which resolved. Able to continue with mat ther-ex without further complaints. Ended session with ionto patch placement to R knee for continued pain relief. No complaints at end of session.    Personal Factors and Comorbidities Age;Comorbidity 1;Fitness;Past/Current Experience;Time since onset of injury/illness/exacerbation    Comorbidities back surgery L3-4 1997    PT Frequency 2x / week    PT Duration 8 weeks    PT Treatment/Interventions ADLs/Self Care Home Management;Canalith Repostioning;Cryotherapy;Electrical Stimulation;Iontophoresis 69m/ml Dexamethasone;Moist Heat;Traction;Balance training;Therapeutic exercise;Therapeutic activities;Functional mobility training;Stair training;Gait training;Ultrasound;Neuromuscular re-education;Patient/family education;Manual techniques;Vestibular;Vasopneumatic Device;Taping;Energy conservation;Dry needling;Passive range of motion;Scar mobilization    PT Next Visit Plan progress ROM and strength; reassess R DH/sidelying test, progress VOR training    Consulted and Agree with Plan of Care Patient           Patient will benefit from skilled therapeutic intervention in order to improve the following deficits and impairments:  Abnormal gait,Hypomobility,Decreased activity tolerance,Decreased strength,Increased fascial restricitons,Impaired UE functional use,Pain,Difficulty walking,Decreased balance,Increased muscle spasms,Improper body mechanics,Dizziness,Decreased range of motion,Impaired flexibility,Postural dysfunction  Visit Diagnosis: Radiculopathy, cervical  region  Muscle weakness (generalized)  Chronic pain of left knee  Acute pain of right knee  Bilateral low back pain, unspecified chronicity, unspecified whether sciatica present  Difficulty in walking, not elsewhere classified  Dizziness and giddiness     Problem List Patient Active Problem List   Diagnosis Date Noted  . Neck pain 11/13/2020  . Vertigo 11/13/2020  . Hypertension 11/03/2020  . Contusion of bone 10/23/2020  . Degenerative tear of left medial meniscus 10/23/2020  . Concussion with no loss of consciousness 10/23/2020  . Paroxysmal atrial fibrillation ([place] 05/22/2019  . Unspecified atrial flutter ([place] 05/22/2019  . Pain in right foot 03/30/2018  . Perennial and seasonal allergic rhinitis 11/09/2017  . History of food allergy 11/09/2017  . Wheezing 11/09/2017  . Dyspnea/wheezing 10/31/2017  . Cough 10/31/2017  . Bronchitis, mucopurulent recurrent ([place] 09/07/2017  . SVT (supraventricular tachycardia) ([place] 04/23/2015  . Abdominal pain, epigastric 06/03/2013  . Unspecified vitamin D deficiency 04/09/2013  . Esophageal reflux 04/09/2013  . Hepatitis B carrier (HE[place] 04/09/2013  . Other and unspecified hyperlipidemia 04/09/2013  . Unspecified sleep apnea 04/09/2013     YJanene Harvey PT, DPT 01/20/21 10:59 AM   [facility] [place] [address]Phone: [phone]  Fax:  [phone] Name: Richard LEVINGSTONMRN: [id number] of Birth: [birth year]

## 2021-01-21 ENCOUNTER — Emergency Department (HOSPITAL_BASED_OUTPATIENT_CLINIC_OR_DEPARTMENT_OTHER)
Admission: EM | Admit: 2021-01-21 | Discharge: 2021-01-21 | Disposition: A | Discharge status: Home / Self Care | Payer: Medicare Other | Attending: Emergency Medicine | Admitting: Emergency Medicine

## 2021-01-21 ENCOUNTER — Other Ambulatory Visit: Payer: Self-pay

## 2021-01-21 ENCOUNTER — Encounter (HOSPITAL_BASED_OUTPATIENT_CLINIC_OR_DEPARTMENT_OTHER): Payer: Self-pay | Admitting: Urology

## 2021-01-21 DIAGNOSIS — Z23 Encounter for immunization: Secondary | ICD-10-CM | POA: Insufficient documentation

## 2021-01-21 DIAGNOSIS — Z87891 Personal history of nicotine dependence: Secondary | ICD-10-CM | POA: Diagnosis not present

## 2021-01-21 DIAGNOSIS — I10 Essential (primary) hypertension: Secondary | ICD-10-CM | POA: Insufficient documentation

## 2021-01-21 DIAGNOSIS — I4891 Unspecified atrial fibrillation: Secondary | ICD-10-CM | POA: Diagnosis not present

## 2021-01-21 DIAGNOSIS — Z7901 Long term (current) use of anticoagulants: Secondary | ICD-10-CM | POA: Diagnosis not present

## 2021-01-21 DIAGNOSIS — T63001A Toxic effect of unspecified snake venom, accidental (unintentional), initial encounter: Secondary | ICD-10-CM | POA: Diagnosis not present

## 2021-01-21 DIAGNOSIS — Z79899 Other long term (current) drug therapy: Secondary | ICD-10-CM | POA: Diagnosis not present

## 2021-01-21 LAB — CBC WITH DIFFERENTIAL/PLATELET
Abs Immature Granulocytes: 0.02 10*3/uL (ref 0.00–0.07)
Abs Immature Granulocytes: 0.03 10*3/uL (ref 0.00–0.07)
Basophils Absolute: 0.1 10*3/uL (ref 0.0–0.1)
Basophils Absolute: 0 10*3/uL (ref 0.0–0.1)
Basophils Relative: 1 %
Basophils Relative: 0 %
Eosinophils Absolute: 0.1 10*3/uL (ref 0.0–0.5)
Eosinophils Absolute: 0 10*3/uL (ref 0.0–0.5)
Eosinophils Relative: 1 %
Eosinophils Relative: 0 %
HCT: 51.9 % (ref 39.0–52.0)
HCT: 40.3 % (ref 39.0–52.0)
Hemoglobin: 17.2 g/dL — ABNORMAL HIGH (ref 13.0–17.0)
Hemoglobin: 13.3 g/dL (ref 13.0–17.0)
Immature Granulocytes: 0 %
Immature Granulocytes: 0 %
Lymphocytes Relative: 37 %
Lymphocytes Relative: 13 %
Lymphs Abs: 3.6 10*3/uL (ref 0.7–4.0)
Lymphs Abs: 1.1 10*3/uL (ref 0.7–4.0)
MCH: 28.4 pg (ref 26.0–34.0)
MCH: 29 pg (ref 26.0–34.0)
MCHC: 33.1 g/dL (ref 30.0–36.0)
MCHC: 33 g/dL (ref 30.0–36.0)
MCV: 85.8 fL (ref 80.0–100.0)
MCV: 88 fL (ref 80.0–100.0)
Monocytes Absolute: 0.7 10*3/uL (ref 0.1–1.0)
Monocytes Absolute: 0.4 10*3/uL (ref 0.1–1.0)
Monocytes Relative: 7 %
Monocytes Relative: 4 %
Neutro Abs: 5.3 10*3/uL (ref 1.7–7.7)
Neutro Abs: 6.9 10*3/uL (ref 1.7–7.7)
Neutrophils Relative %: 54 %
Neutrophils Relative %: 83 %
Platelets: 255 10*3/uL (ref 150–400)
Platelets: 176 10*3/uL (ref 150–400)
RBC: 6.05 MIL/uL — ABNORMAL HIGH (ref 4.22–5.81)
RBC: 4.58 MIL/uL (ref 4.22–5.81)
RDW: 13.6 % (ref 11.5–15.5)
RDW: 13.7 % (ref 11.5–15.5)
WBC: 9.7 10*3/uL (ref 4.0–10.5)
WBC: 8.5 10*3/uL (ref 4.0–10.5)
nRBC: 0 % (ref 0.0–0.2)
nRBC: 0 % (ref 0.0–0.2)

## 2021-01-21 LAB — BASIC METABOLIC PANEL
Anion gap: 11 (ref 5–15)
BUN: 17 mg/dL (ref 8–23)
CO2: 22 mmol/L (ref 22–32)
Calcium: 9.8 mg/dL (ref 8.9–10.3)
Chloride: 105 mmol/L (ref 98–111)
Creatinine, Ser: 1.37 mg/dL — ABNORMAL HIGH (ref 0.61–1.24)
GFR, Estimated: 58 mL/min — ABNORMAL LOW (ref >60)
Glucose, Bld: 122 mg/dL — ABNORMAL HIGH (ref 70–99)
Potassium: 3.8 mmol/L (ref 3.5–5.1)
Sodium: 138 mmol/L (ref 135–145)

## 2021-01-21 LAB — PROTIME-INR
INR: 0.9 (ref 0.8–1.2)
INR: 1 (ref 0.8–1.2)
Prothrombin Time: 12.5 seconds (ref 11.4–15.2)
Prothrombin Time: 13.4 seconds (ref 11.4–15.2)

## 2021-01-21 LAB — FIBRINOGEN
Fibrinogen: 413 mg/dL (ref 210–475)
Fibrinogen: 389 mg/dL (ref 210–475)

## 2021-01-21 MED ORDER — SODIUM CHLORIDE 0.9 % IV SOLN
4.0000 | Freq: Once | INTRAVENOUS | Status: AC
  Administered 2021-01-21: 4 via INTRAVENOUS
  Filled 2021-01-21: qty 72

## 2021-01-21 MED ORDER — HYDROMORPHONE HCL 1 MG/ML IJ SOLN
1.0000 mg | Freq: Once | INTRAMUSCULAR | Status: AC
  Administered 2021-01-21: 1 mg via INTRAVENOUS
  Filled 2021-01-21: qty 1

## 2021-01-21 MED ORDER — SODIUM CHLORIDE 0.9 % IV BOLUS
1000.0000 mL | Freq: Once | INTRAVENOUS | Status: AC
  Administered 2021-01-21: 1000 mL via INTRAVENOUS

## 2021-01-21 MED ORDER — HYDROMORPHONE HCL 1 MG/ML IJ SOLN
1.0000 mg | Freq: Once | INTRAMUSCULAR | Status: AC
Start: 2021-01-21 — End: 2021-01-21
  Administered 2021-01-21: 1 mg via INTRAVENOUS
  Filled 2021-01-21: qty 1

## 2021-01-21 MED ORDER — TETANUS-DIPHTH-ACELL PERTUSSIS 5-2.5-18.5 LF-MCG/0.5 IM SUSY
0.5000 mL | PREFILLED_SYRINGE | Freq: Once | INTRAMUSCULAR | Status: AC
  Administered 2021-01-21: 0.5 mL via INTRAMUSCULAR
  Filled 2021-01-21: qty 0.5

## 2021-01-21 MED ORDER — CROTALIDAE POLYVAL IMMUNE FAB IV SOLR
INTRAVENOUS | Status: AC
  Filled 2021-01-21: qty 72

## 2021-01-21 MED ORDER — OXYCODONE-ACETAMINOPHEN 5-325 MG PO TABS: 1.0000 | ORAL_TABLET | Freq: Four times a day (QID) | ORAL | 0 refills | Status: DC | PRN

## 2021-01-21 MED ORDER — HYDROMORPHONE HCL 1 MG/ML IJ SOLN
1.0000 mg | Freq: Once | INTRAMUSCULAR | Status: AC
  Administered 2021-01-21: 1 mg via INTRAVENOUS

## 2021-01-21 MED ORDER — KETOROLAC TROMETHAMINE 30 MG/ML IJ SOLN
30.0000 mg | Freq: Once | INTRAMUSCULAR | Status: AC
Start: 2021-01-21 — End: 2021-01-21
  Administered 2021-01-21: 30 mg via INTRAVENOUS
  Filled 2021-01-21: qty 1

## 2021-01-21 MED ORDER — HYDROMORPHONE HCL 1 MG/ML IJ SOLN
INTRAMUSCULAR | Status: AC
  Filled 2021-01-21: qty 1

## 2021-01-21 NOTE — ED Notes (Signed)
ED Provider at bedside. 

## 2021-01-21 NOTE — ED Notes (Signed)
Currently resting with eyes closed.  crofab infusion started.

## 2021-01-21 NOTE — ED Provider Notes (Signed)
[facility] EMERGENCY DEPARTMENT Provider Note   CSN: [id number] Arrival date & time: 01/21/21  1322     History Chief Complaint  Patient presents with  . Snake Bite    Richard Walker is a 65 y.o. male.  HPI      65 year old male with history of paroxysmal atrial fibrillation on Xarelto and flecainide, hypertension, hyperlipidemia, diabetes (reports pre-DM but A1c greater than 6.5/on metformin) who presents with concern for snakebite.  Reports he was going to sit down approximately 30 minutes prior to arrival, and placed his right hand on the floor, and felt a bite or sudden pain to his pinky finger, and they saw a small snake got into a crawl space area.  Son identified the snake as a copperhead, small, and daughter does have a picture with snake seen in the shadow with copperhead markings visualized.  Reports severe pain over the bite.  10 out of 10 pain.  Denies shortness of breath, nausea, vomiting, headache, dizziness, numbness.  Reports difficulty moving his finger due to pain and swelling.  He reports his PCP was going to update his tetanus shot in October, unsure of last dose.  They did place a tourniquet over the area.   Past Medical History:  Diagnosis Date  . DDD (degenerative disc disease), lumbar    L3/L4  . Diverticulosis   . GERD (gastroesophageal reflux disease)   . Hyperlipidemia   . Low sperm motility   . Pre-diabetes   . Sleep apnea    uses cpap   . SVT (supraventricular tachycardia) [facility](HCC)     Patient Active Problem List   Diagnosis Date Noted  . Neck pain 11/13/2020  . Vertigo 11/13/2020  . Hypertension 11/03/2020  . Contusion of bone 10/23/2020  . Degenerative tear of left medial meniscus 10/23/2020  . Concussion with no loss of consciousness 10/23/2020  . Paroxysmal atrial fibrillation (HCC) 05/22/2019  . Unspecified atrial flutter (HCC) 05/22/2019  . Pain in right foot 03/30/2018  . Perennial and seasonal allergic rhinitis 11/09/2017   . History of food allergy 11/09/2017  . Wheezing 11/09/2017  . Dyspnea/wheezing 10/31/2017  . Cough 10/31/2017  . Bronchitis, mucopurulent recurrent (HCC) 09/07/2017  . SVT (supraventricular tachycardia) (HCC) 04/23/2015  . Abdominal pain, epigastric 06/03/2013  . Unspecified vitamin D deficiency 04/09/2013  . Esophageal reflux 04/09/2013  . Hepatitis B carrier (HCC) 04/09/2013  . Other and unspecified hyperlipidemia 04/09/2013  . Unspecified sleep apnea 04/09/2013    Past Surgical History:  Procedure Laterality Date  . 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 REPAIR OF LEFT INDEX AND LONG FINGER BY FUSION /  REPAIR OF RING FINGER;  Surgeon: Mack Hookhompson, David, MD;  [facility];  Service: Orthopedics;  Laterality: Left;  . BACK SURGERY    . CARDIAC CATHETERIZATION N/A 12/22/2015   Procedure: Left Heart Cath and Coronary Angiography;  Surgeon: Peter M SwazilandJordan, MD;  [facility];  Service: Cardiovascular;  Laterality: N/A;  . CARDIAC ELECTROPHYSIOLOGY STUDY AND ABLATION    . ELECTROPHYSIOLOGIC STUDY N/A 02/19/2016   Procedure: SVT Ablation;  Surgeon: Marinus MawGregg W Taylor, MD;  [facility];  Service: Cardiovascular;  Laterality: N/A;  . septal reconstruction  2017  . TURBINATE REDUCTION Bilateral 07/2016  . WISDOM TOOTH EXTRACTION         Family History  Problem Relation Age of Onset  . Hypertension Father   . Food Allergy Son   . Colon cancer Neg Hx   .  Stomach cancer Neg Hx   . Allergic rhinitis Neg Hx   . Angioedema Neg Hx   . Asthma Neg Hx   . Eczema Neg Hx   . Immunodeficiency Neg Hx   . Urticaria Neg Hx     Social History   Tobacco Use  . Smoking status: Former Smoker    Packs/day: 1.00    Years: 10.00    Pack years: 10.00    Types: Cigarettes    Quit date: 1992    Years since quitting: 30.4  . Smokeless tobacco: Never Used  . Tobacco comment: Uses Hookah Pipe occasionally  Vaping Use  . Vaping Use: Never used  Substance Use  Topics  . Alcohol use: No  . Drug use: No    Home Medications Prior to Admission medications   Medication Sig Start Date End Date Taking? Authorizing Provider  acetaminophen (TYLENOL) 325 MG tablet Take 2 tablets (650 mg total) by mouth every 6 (six) hours. 07/15/18   Mack Hook, MD  albuterol [facility] HFA) 108 [phone] Base) MCG/ACT inhaler Inhale 1-2 puffs into the lungs every 6 (six) hours as needed for wheezing or shortness of breath. 09/15/20   Coralyn Helling, MD  azelastine (ASTELIN) 0.1 % nasal spray 1-2 sprays per nostril 2 times daily as needed. 09/15/20   Coralyn Helling, MD  benzonatate (TESSALON) 100 MG capsule Take 1 capsule (100 mg total) by mouth 3 (three) times daily as needed for cough. 11/11/20   Saguier, Ramon Dredge, PA-C  budesonide-formoterol (SYMBICORT) 160-4.5 MCG/ACT inhaler Inhale 2 puffs into the lungs 2 (two) times daily. 09/15/20   Coralyn Helling, MD  cyclobenzaprine (FLEXERIL) 10 MG tablet Take 1 tablet (10 mg total) by mouth 3 (three) times daily as needed. 11/13/20   Myra Rude, MD  diclofenac (VOLTAREN) 75 MG EC tablet Take 1 tablet (75 mg total) by mouth 2 (two) times daily. 01/14/20   Felecia Shelling, DPM  ezetimibe (ZETIA) 10 MG tablet Take 1 tablet (10 mg total) by mouth daily. 07/25/18   Saguier, Ramon Dredge, PA-C  famotidine-calcium carbonate-magnesium hydroxide (PEPCID COMPLETE) 10-800-165 MG chewable tablet Chew by mouth.    [provider]  fenofibrate (TRICOR) 145 MG tablet Take 1 tablet by mouth daily. 01/10/19   [provider]  flecainide (TAMBOCOR) 150 MG tablet Take 1 tablet (150 mg total) by mouth 2 (two) times daily. 11/03/20   Marinus Maw, MD  fluticasone (FLONASE) 50 MCG/ACT nasal spray Place 1 spray into both nostrils daily. 09/15/20   Coralyn Helling, MD  gabapentin (NEURONTIN) 100 MG capsule Take 1 capsule (100 mg total) by mouth 3 (three) times daily. 10/02/18   Felecia Shelling, DPM  ibuprofen (ADVIL) 200 MG tablet Take 3 tablets (600 mg total)  by mouth every 6 (six) hours. 07/15/18   Mack Hook, MD  levocetirizine (XYZAL) 5 MG tablet TAKE 1 TABLET(5 MG) BY MOUTH EVERY EVENING 11/11/20   Saguier, Ramon Dredge, PA-C  Magnesium 250 MG TABS Take 1 tablet by mouth once a week.    [provider]  meclizine (ANTIVERT) 25 MG tablet Take 1 tablet (25 mg total) by mouth 3 (three) times daily as needed for dizziness. 12/05/20   Geoffery Lyons, MD  meloxicam (MOBIC) 15 MG tablet Take 1 tablet (15 mg total) by mouth daily. 01/17/19   Felecia Shelling, DPM  metFORMIN (GLUCOPHAGE) 500 MG tablet Take 1 tablet (500 mg total) by mouth 2 (two) times daily with a meal. 06/30/18   Saguier, Ramon Dredge, PA-C  metoprolol tartrate (LOPRESSOR) 25 MG tablet Take one tablet by mouth twice a day.  May take one additional tablet by mouth as needed for breakthrough palpitations. 11/03/20   Marinus Maw, MD  montelukast (SINGULAIR) 10 MG tablet TAKE 1 TABLET BY MOUTH 1 TIME AT NIGHT FOR COUGHING OR WHEEZING 12/21/20   Coralyn Helling, MD  Olopatadine HCl 0.2 % SOLN Apply 1 drop to eye daily as needed (allergies). 09/25/20   Coralyn Helling, MD  pantoprazole (PROTONIX) 40 MG tablet Take 1 tablet (40 mg total) by mouth 2 (two) times daily. 08/17/18   Olive Bass, FNP  PRESCRIPTION MEDICATION Inhale into the lungs at bedtime. CPAP    [provider]  rivaroxaban (XARELTO) 20 MG TABS tablet Take 1 tablet (20 mg total) by mouth daily with supper. 11/03/20   Marinus Maw, MD  sildenafil (REVATIO) 20 MG tablet Take 1-2 tablets (20-40 mg total) by mouth as needed. 07/27/17   Marinus Maw, MD  Vitamin D, Ergocalciferol, (DRISDOL) 1.25 MG (50000 UT) CAPS capsule Take 1 capsule (50,000 Units total) by mouth every 7 (seven) days. 07/25/18   Saguier, Ramon Dredge, PA-C    Allergies    Tree extract, Pork-derived products, and Statins  Review of Systems   Review of Systems  Constitutional: Negative for fever.  HENT: Negative for sore throat.   Eyes: Negative for visual  disturbance.  Respiratory: Negative for shortness of breath.   Cardiovascular: Negative for chest pain and leg swelling.  Gastrointestinal: Negative for abdominal pain, nausea and vomiting.  Genitourinary: Negative for difficulty urinating.  Musculoskeletal: Positive for arthralgias and joint swelling. Negative for back pain and neck stiffness.  Skin: Positive for wound. Negative for rash.  Neurological: Negative for syncope and headaches.    Physical Exam Updated Vital Signs BP 139/89   Pulse 61   Temp 98.6 F (37 C) (Oral)   Resp 14   Ht 5\' 9"  (1.753 m)   Wt 118.4 kg   SpO2 98%   BMI 38.54 kg/m   Physical Exam Vitals and nursing note reviewed.  Constitutional:      General: He is not in acute distress.    Appearance: He is well-developed. He is not diaphoretic.  HENT:     Head: Normocephalic and atraumatic.  Eyes:     Conjunctiva/sclera: Conjunctivae normal.  Cardiovascular:     Rate and Rhythm: Normal rate and regular rhythm.  Pulmonary:     Effort: Pulmonary effort is normal. No respiratory distress.  Musculoskeletal:     Cervical back: Normal range of motion.     Comments: 2 puncture wounds with surrounding contusion and swelling to palmar side of proximal pinky finger, normal capillary refill, swelling on initial exam of digit only, difficult to flex due to swelling and pain   Skin:    General: Skin is warm and dry.  Neurological:     Mental Status: He is alert and oriented to person, place, and time.     ED Results / Procedures / Treatments   Labs (all labs ordered are listed, but only abnormal results are displayed) Labs Reviewed  BASIC METABOLIC PANEL - Abnormal; Notable for the following components:      Result Value   Glucose, Bld 122 (*)    Creatinine, Ser 1.37 (*)    GFR, Estimated 58 (*)    All other components within normal limits  CBC WITH DIFFERENTIAL/PLATELET - Abnormal; Notable for the following components:   RBC 6.05 (*)  Hemoglobin 17.2  (*)    All other components within normal limits  PROTIME-INR  CBC WITH DIFFERENTIAL/PLATELET  CBC WITH DIFFERENTIAL/PLATELET  PROTIME-INR  PROTIME-INR  FIBRINOGEN  FIBRINOGEN  FIBRINOGEN    EKG None  Radiology No results found.  Procedures .Critical Care Performed by: Alvira Monday, MD Authorized by: Alvira Monday, MD   Critical care provider statement:    Critical care time (minutes):  30   Critical care was time spent personally by me on the following activities:  Discussions with consultants, evaluation of patient's response to treatment, examination of patient, ordering and performing treatments and interventions, ordering and review of laboratory studies, ordering and review of radiographic studies, pulse oximetry, re-evaluation of patient's condition, obtaining history from patient or surrogate and review of old charts     Medications Ordered in ED Medications  crotalidae polyvalent immune fab (CROFAB) 4 vial in sodium chloride 0.9 % 250 mL infusion (4 vials Intravenous New Bag/Given 01/21/21 1534)  crotalidae polyvalent immune fab (CROFAB) injection (has no administration in time range)  Tdap (BOOSTRIX) injection 0.5 mL (0.5 mLs Intramuscular Given 01/21/21 1350)  HYDROmorphone (DILAUDID) injection 1 mg (1 mg Intravenous Given 01/21/21 1352)  sodium chloride 0.9 % bolus 1,000 mL (0 mLs Intravenous Stopped 01/21/21 1500)  HYDROmorphone (DILAUDID) injection 1 mg (1 mg Intravenous Given 01/21/21 1455)  HYDROmorphone (DILAUDID) injection 1 mg (1 mg Intravenous Given 01/21/21 1538)    ED Course  I have reviewed the triage vital signs and the nursing notes.  Pertinent labs & imaging results that were available during my care of the patient were reviewed by me and considered in my medical decision making (see chart for details).    MDM Rules/Calculators/A&P                          64 year old male with history of paroxysmal atrial fibrillation on Xarelto and flecainide,  hypertension, hyperlipidemia, diabetes (reports pre-DM but A1c greater than 6.5/on metformin) who presents with concern for copperhead snakebite.     Denies other systemic symptoms, normal coagulation on labs.  Mild Cr elevation compared to prior discussed with pt.  Initial exam with localized swelling only, initially not candidate for crofab but on reevaluation has moved a few cm past wrist.  Discussed with poison control and will start crofab.  Severe pain, but at this time do not feel he has compartment syndrome.  Denies numbness/tingling, normal cap refill.    Care signed out to Dr. Particia Nearing with crofab given, plan for continued observation and care.    Final Clinical Impression(s) / ED Diagnoses Final diagnoses:  Toxic effect of snake venom, unintentional, initial encounter    Rx / DC Orders ED Discharge Orders    None       Alvira Monday, MD 01/21/21 1553

## 2021-01-21 NOTE — ED Triage Notes (Signed)
approx 15 min ago , right 5th digit possible snake bite, unable to id snake color or type.  Small puncture wound noted

## 2021-01-21 NOTE — ED Provider Notes (Signed)
Pt signed out by Dr. Tomma Lightning.  Pt did receive CroFab for a copperhead bite to his right pinky finger.  Swelling has decreased and pain has improved.   He was observed for several hours.  No evidence of compartment syndrome.   Poison control involved.  They will call pt tomorrow to check on him.  CRITICAL CARE Performed by: Jacalyn Lefevre   Total critical care time: 30 minutes  Critical care time was exclusive of separately billable procedures and treating other patients.  Critical care was necessary to treat or prevent imminent or life-threatening deterioration.  Critical care was time spent personally by me on the following activities: development of treatment plan with patient and/or surrogate as well as nursing, discussions with consultants, evaluation of patient's response to treatment, examination of patient, obtaining history from patient or surrogate, ordering and performing treatments and interventions, ordering and review of laboratory studies, ordering and review of radiographic studies, pulse oximetry and re-evaluation of patient's condition.   Jacalyn Lefevre, MD 01/21/21 2052

## 2021-01-23 ENCOUNTER — Other Ambulatory Visit: Payer: Self-pay

## 2021-01-23 ENCOUNTER — Ambulatory Visit (INDEPENDENT_AMBULATORY_CARE_PROVIDER_SITE_OTHER): Payer: Medicare Other | Admitting: Medical

## 2021-01-23 VITALS — BP 133/80 | HR 71 | Resp 20 | Ht 69.0 in | Wt 260.0 lb

## 2021-01-23 DIAGNOSIS — W5911XD Bitten by nonvenomous snake, subsequent encounter: Secondary | ICD-10-CM | POA: Diagnosis not present

## 2021-01-23 DIAGNOSIS — M79641 Pain in right hand: Secondary | ICD-10-CM | POA: Diagnosis not present

## 2021-01-23 DIAGNOSIS — L0390 Cellulitis, unspecified: Secondary | ICD-10-CM | POA: Diagnosis not present

## 2021-01-23 DIAGNOSIS — S61356A Open bite of right little finger with damage to nail, initial encounter: Secondary | ICD-10-CM

## 2021-01-23 LAB — PROTIME-INR
INR: 1 ratio (ref 0.8–1.0)
Prothrombin Time: 10.7 s (ref 9.6–13.1)

## 2021-01-23 LAB — CBC WITH DIFFERENTIAL/PLATELET
Basophils Absolute: 0 10*3/uL (ref 0.0–0.1)
Basophils Relative: 0.5 % (ref 0.0–3.0)
Eosinophils Absolute: 0.2 10*3/uL (ref 0.0–0.7)
Eosinophils Relative: 2.2 % (ref 0.0–5.0)
HCT: 45.2 % (ref 39.0–52.0)
Hemoglobin: 15.2 g/dL (ref 13.0–17.0)
Lymphocytes Relative: 22.8 % (ref 12.0–46.0)
Lymphs Abs: 1.6 10*3/uL (ref 0.7–4.0)
MCHC: 33.5 g/dL (ref 30.0–36.0)
MCV: 84.3 fl (ref 78.0–100.0)
Monocytes Absolute: 0.5 10*3/uL (ref 0.1–1.0)
Monocytes Relative: 7.2 % (ref 3.0–12.0)
Neutro Abs: 4.8 10*3/uL (ref 1.4–7.7)
Neutrophils Relative %: 67.3 % (ref 43.0–77.0)
Platelets: 192 10*3/uL (ref 150.0–400.0)
RBC: 5.37 Mil/uL (ref 4.22–5.81)
RDW: 14.5 % (ref 11.5–15.5)
WBC: 7.2 10*3/uL (ref 4.0–10.5)

## 2021-01-23 LAB — FIBRINOGEN: Fibrinogen: 422 mg/dL (ref 175–425)

## 2021-01-23 MED ORDER — DOXYCYCLINE HYCLATE 100 MG PO TABS: 100.0000 mg | ORAL_TABLET | Freq: Two times a day (BID) | ORAL | 0 refills | Status: DC

## 2021-01-23 MED ORDER — GABAPENTIN 100 MG PO CAPS: 100.0000 mg | ORAL_CAPSULE | Freq: Three times a day (TID) | ORAL | 0 refills | Status: DC

## 2021-01-23 MED ORDER — CEFTRIAXONE SODIUM 1 G IJ SOLR
1.0000 g | Freq: Once | INTRAMUSCULAR | Status: AC
Start: 2021-01-23 — End: 2021-01-23
  Administered 2021-01-23: 1 g via INTRAMUSCULAR

## 2021-01-23 MED ORDER — OXYCODONE-ACETAMINOPHEN 10-325 MG PO TABS: 1.0000 | ORAL_TABLET | Freq: Three times a day (TID) | ORAL | 0 refills | Status: DC | PRN

## 2021-01-23 NOTE — Patient Instructions (Signed)
History of recent snakebite.  Pain is severe despite using Percocet 5/325.  On exam concerned that you might be developing secondary skin infection/cellulitis.  We gave you 1 g Rocephin IM today and prescribe doxycycline.  For pain did send prescription for Percocet 10/325.  Pharmacy might instruct you to go ahead and use current to have them fill higher dose when you run out.  Presently you have good capillary refill and normal pulses.  We need to watch closely over the weekend.  If you have worsening swelling, spreading redness or bruising up the arm then recommend ED evaluation this weekend.  Presently will get stat CBC, CMP, fibrinogen and INR studies.  Follow-up on Monday or ED over the weekend if needed.

## 2021-01-23 NOTE — Progress Notes (Signed)
Subjective:    Patient ID: Richard Walker, male    DOB: 11/02/1955, 65 y.o.   MRN: [id number]  HPI  Pt in for follow up from ED.  Arrival date & time: 01/21/21  [id number]  "65 year old male with history of paroxysmal atrial fibrillation on Xarelto and flecainide, hypertension, hyperlipidemia, diabetes (reports pre-DM but A1c greater than 6.5/on metformin) who presents with concern for snakebite.  Reports he was going to sit down approximately 30 minutes prior to arrival, and placed his right hand on the floor, and felt a bite or sudden pain to his pinky finger, and they saw a small snake got into a crawl space area.  Son identified the snake as a copperhead, small, and daughter does have a picture with snake seen in the shadow with copperhead markings visualized.  Reports severe pain over the bite.  10 out of 10 pain.  Denies shortness of breath, nausea, vomiting, headache, dizziness, numbness.  Reports difficulty moving his finger due to pain and swelling.  He reports his PCP was going to update his tetanus shot in October, unsure of last dose.  They did place a tourniquet over the area.   Below is ED note in below in ".   Exam at time of ED visit  Musculoskeletal:     Cervical back: Normal range of motion.     Comments: 2 puncture wounds with surrounding contusion and swelling to palmar side of proximal pinky finger, normal capillary refill, swelling on initial exam of digit only, difficult to flex due to swelling and pain      A/P from the ED.  "65 year old male with history of paroxysmal atrial fibrillation on Xarelto and flecainide, hypertension, hyperlipidemia, diabetes (reports pre-DM but A1c greater than 6.5/on metformin) who presents with concern for copperhead snakebite.     Denies other systemic symptoms, normal coagulation on labs.  Mild Cr elevation compared to prior discussed with pt.  Initial exam with localized swelling only, initially not candidate for crofab but on  reevaluation has moved a few cm past wrist.  Discussed with poison control and will start crofab.  Severe pain, but at this time do not feel he has compartment syndrome.  Denies numbness/tingling, normal cap refill.    Care signed out to Dr. Particia NearingHaviland with crofab given, plan for continued observation and care."   Pt has a lot of swelling to rt hand and 5th digit were snake bit him. Worse swelling since left ED. Some swelling up forearm and discoloration.   Pt had no fevers, chills or sweats.   Pt is on percocet 5/325 daily.   Pt told by poison control and states may take up to month to recover.   Review of Systems  Constitutional: Negative for chills, fatigue and fever.  Respiratory: Negative for cough, chest tightness, shortness of breath and wheezing.   Cardiovascular: Negative for chest pain and palpitations.  Gastrointestinal: Negative for abdominal pain, constipation and nausea.  Musculoskeletal: Negative for back pain and myalgias.  Skin: Negative for rash.  Hematological: Negative for adenopathy. Does not bruise/bleed easily.  Psychiatric/Behavioral: Negative for behavioral problems, confusion and sleep disturbance. The patient is not nervous/anxious.     Past Medical History:  Diagnosis Date  . DDD (degenerative disc disease), lumbar    L3/L4  . Diverticulosis   . GERD (gastroesophageal reflux disease)   . Hyperlipidemia   . Low sperm motility   . Pre-diabetes   . Sleep apnea    uses cpap   .  SVT (supraventricular tachycardia) (HCC)      Social History   Socioeconomic History  . Marital status: Married    Spouse name: Not on file  . Number of children: Not on file  . Years of education: Not on file  . Highest education level: Not on file  Occupational History  . Not on file  Tobacco Use  . Smoking status: Former Smoker    Packs/day: 1.00    Years: 10.00    Pack years: 10.00    Types: Cigarettes    Quit date: 1992    Years since quitting: 30.4  .  Smokeless tobacco: Never Used  . Tobacco comment: Uses Hookah Pipe occasionally  Vaping Use  . Vaping Use: Never used  Substance and Sexual Activity  . Alcohol use: No  . Drug use: No  . Sexual activity: Yes  Other Topics Concern  . Not on file  Social History Narrative   Marital Status: Married Engineer, drilling)   Children:  4 (3 Sons/1 Daughter)   Pets:  None    Living Situation: Lives with spouse and 4 children   Origin:  He was born in [place].   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     Alcohol Use:  None   Drug Use:  None   Diet:  Regular   Exercise:  None   Hobbies:  Cards   Social Determinants of Health   Financial Resource Strain: Not on file  Food Insecurity: Not on file  Transportation Needs: Not on file  Physical Activity: Not on file  Stress: Not on file  Social Connections: Not on file  Intimate Partner Violence: Not on file    Past Surgical History:  Procedure Laterality Date  . 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 REPAIR OF LEFT INDEX AND LONG FINGER BY FUSION /  REPAIR OF RING FINGER;  Surgeon: Mack Hook, MD;  Location: [facility] OR;  Service: Orthopedics;  Laterality: Left;  . BACK SURGERY    . CARDIAC CATHETERIZATION N/A 12/22/2015   Procedure: Left Heart Cath and Coronary Angiography;  Surgeon: Peter M Swaziland, MD;  Location: [facility] INVASIVE CV LAB;  Service: Cardiovascular;  Laterality: N/A;  . CARDIAC ELECTROPHYSIOLOGY STUDY AND ABLATION    . ELECTROPHYSIOLOGIC STUDY N/A 02/19/2016   Procedure: SVT Ablation;  Surgeon: Marinus Maw, MD;  Location: [facility] INVASIVE CV LAB;  Service: Cardiovascular;  Laterality: N/A;  . septal reconstruction  2017  . TURBINATE REDUCTION Bilateral 07/2016  . WISDOM TOOTH EXTRACTION      Family History  Problem Relation Age of Onset  . Hypertension Father   . Food Allergy Son   . Colon cancer  Neg Hx   . Stomach cancer Neg Hx   . Allergic rhinitis Neg Hx   . Angioedema Neg Hx   . Asthma Neg Hx   . Eczema Neg Hx   . Immunodeficiency Neg Hx   . Urticaria Neg Hx     Allergies  Allergen Reactions  . Tree Extract   . Pork-Derived Products Swelling  . Statins     Muscle aches    Current Outpatient Medications on File Prior to Visit  Medication Sig Dispense Refill  . acetaminophen (TYLENOL) 325 MG tablet Take 2 tablets (650 mg total) by mouth every 6 (six) hours.    [name] albuterol (PROAIR HFA) 108 (90 Base) MCG/ACT inhaler Inhale 1-2 puffs into the  lungs every 6 (six) hours as needed for wheezing or shortness of breath. 8 g 5  . azelastine (ASTELIN) 0.1 % nasal spray 1-2 sprays per nostril 2 times daily as needed. 30 mL 5  . cyclobenzaprine (FLEXERIL) 10 MG tablet Take 1 tablet (10 mg total) by mouth 3 (three) times daily as needed. 45 tablet 0  . diclofenac (VOLTAREN) 75 MG EC tablet Take 1 tablet (75 mg total) by mouth 2 (two) times daily. 90 tablet 2  . ezetimibe (ZETIA) 10 MG tablet Take 1 tablet (10 mg total) by mouth daily. 30 tablet 11  . famotidine-calcium carbonate-magnesium hydroxide (PEPCID COMPLETE) 10-800-165 MG chewable tablet Chew by mouth.    . fenofibrate (TRICOR) 145 MG tablet Take 1 tablet by mouth daily.    . flecainide (TAMBOCOR) 150 MG tablet Take 1 tablet (150 mg total) by mouth 2 (two) times daily. 180 tablet 3  . fluticasone (FLONASE) 50 MCG/ACT nasal spray Place 1 spray into both nostrils daily. 16 g 5  . gabapentin (NEURONTIN) 100 MG capsule Take 1 capsule (100 mg total) by mouth 3 (three) times daily. 90 capsule 1  . ibuprofen (ADVIL) 200 MG tablet Take 3 tablets (600 mg total) by mouth every 6 (six) hours.    [name] levocetirizine (XYZAL) 5 MG tablet TAKE 1 TABLET(5 MG) BY MOUTH EVERY EVENING 30 tablet 11  . Magnesium 250 MG TABS Take 1 tablet by mouth once a week.    . meclizine (ANTIVERT) 25 MG tablet Take 1 tablet (25 mg total) by mouth 3 (three) times  daily as needed for dizziness. 15 tablet 0  . meloxicam (MOBIC) 15 MG tablet Take 1 tablet (15 mg total) by mouth daily. 30 tablet 1  . metFORMIN (GLUCOPHAGE) 500 MG tablet Take 1 tablet (500 mg total) by mouth 2 (two) times daily with a meal. 60 tablet 3  . metoprolol tartrate (LOPRESSOR) 25 MG tablet Take one tablet by mouth twice a day.  May take one additional tablet by mouth as needed for breakthrough palpitations. 270 tablet 3  . montelukast (SINGULAIR) 10 MG tablet TAKE 1 TABLET BY MOUTH 1 TIME AT NIGHT FOR COUGHING OR WHEEZING 30 tablet 5  . Olopatadine HCl 0.2 % SOLN Apply 1 drop to eye daily as needed (allergies). 7.5 mL 3  . oxyCODONE-acetaminophen (PERCOCET/ROXICET) 5-325 MG tablet Take 1 tablet by mouth every 6 (six) hours as needed for severe pain. 15 tablet 0  . pantoprazole (PROTONIX) 40 MG tablet Take 1 tablet (40 mg total) by mouth 2 (two) times daily. 60 tablet 0  . PRESCRIPTION MEDICATION Inhale into the lungs at bedtime. CPAP    . rivaroxaban (XARELTO) 20 MG TABS tablet Take 1 tablet (20 mg total) by mouth daily with supper. 90 tablet 3  . sildenafil (REVATIO) 20 MG tablet Take 1-2 tablets (20-40 mg total) by mouth as needed. 60 tablet 1  . Vitamin D, Ergocalciferol, (DRISDOL) 1.25 MG (50000 UT) CAPS capsule Take 1 capsule (50,000 Units total) by mouth every 7 (seven) days. 4 capsule 0   No current facility-administered medications on file prior to visit.    BP 133/80   Pulse 71   Resp 20   Ht 5\' 9"  (1.753 m)   Wt 260 lb (117.9 kg)   SpO2 98%   BMI 38.40 kg/m       Objective:   Physical Exam  General Mental Status- Alert. General Appearance- Not in acute distress.   Skin General: Color- Normal Color. Moisture-  Normal Moisture.  Neck Carotid Arteries- Normal color. Moisture- Normal Moisture. No carotid bruits. No JVD.  Chest and Lung Exam Auscultation: Breath Sounds:-Normal.  Cardiovascular Auscultation:Rythm- Regular. Murmurs & Other Heart  Sounds:Auscultation of the heart reveals- No Murmurs.  Abdomen Inspection:-Inspeection Normal. Palpation/Percussion:Note:No mass. Palpation and Percussion of the abdomen reveal- Non Tender, Non Distended + BS, no rebound or guarding.    Neurologic Cranial Nerve exam:- CN III-XII intact(No nystagmus), symmetric smile. Strength:- 5/5 equal and symmetric strength both upper and lower extremities.  Rt hand moderate to severe swelling to fingers and dorsal apect of hand. Redness to rt 5th digit medial aspect. Capillary refill intake. Radial pulse normal. Pain on palpation of hand up to proximal forearm. Mild disoloration proximal forearm. Faint bruised appearance.    Assessment & Plan:  History of recent snakebite.  Pain is severe despite using Percocet 5/325.  On exam concerned that you might be developing secondary skin infection/cellulitis.  We gave you 1 g Rocephin IM today and prescribe doxycycline.  For pain did send prescription for Percocet 10/325.  Pharmacy might instruct you to go ahead and use current to have them fill higher dose when you run out.  Presently you have good capillary refill and normal pulses.  We need to watch closely over the weekend.  If you have worsening swelling, spreading redness or bruising up the arm then recommend ED evaluation this weekend.  Presently will get stat CBC, CMP, fibrinogen and INR studies.  Follow-up on Monday or ED over the weekend if needed.  Time spent with patient today was 42  minutes which consisted of chart review, discussing diagnosis, work up treatment and documentation.  Reviewed and consulted with supervising MD Dr. Laury Axon today as well.

## 2021-01-26 ENCOUNTER — Ambulatory Visit (INDEPENDENT_AMBULATORY_CARE_PROVIDER_SITE_OTHER): Payer: Medicare Other | Admitting: Medical

## 2021-01-26 ENCOUNTER — Ambulatory Visit (HOSPITAL_BASED_OUTPATIENT_CLINIC_OR_DEPARTMENT_OTHER)
Admission: RE | Admit: 2021-01-26 | Discharge: 2021-01-26 | Disposition: A | Discharge status: Home / Self Care | Payer: Medicare Other | Source: Ambulatory Visit | Attending: Medical | Admitting: Medical

## 2021-01-26 ENCOUNTER — Other Ambulatory Visit: Payer: Self-pay

## 2021-01-26 ENCOUNTER — Telehealth: Payer: Self-pay | Admitting: Medical

## 2021-01-26 VITALS — BP 150/80 | HR 67 | Resp 18 | Ht 69.0 in | Wt 257.0 lb

## 2021-01-26 DIAGNOSIS — M79641 Pain in right hand: Secondary | ICD-10-CM | POA: Diagnosis present

## 2021-01-26 DIAGNOSIS — S61256D Open bite of right little finger without damage to nail, subsequent encounter: Secondary | ICD-10-CM

## 2021-01-26 DIAGNOSIS — W5911XD Bitten by nonvenomous snake, subsequent encounter: Secondary | ICD-10-CM | POA: Diagnosis not present

## 2021-01-26 DIAGNOSIS — L089 Local infection of the skin and subcutaneous tissue, unspecified: Secondary | ICD-10-CM

## 2021-01-26 DIAGNOSIS — M79644 Pain in right finger(s): Secondary | ICD-10-CM | POA: Diagnosis not present

## 2021-01-26 LAB — COMPREHENSIVE METABOLIC PANEL
ALT: 15 U/L (ref 0–53)
AST: 14 U/L (ref 0–37)
Albumin: 4 g/dL (ref 3.5–5.2)
Alkaline Phosphatase: 58 U/L (ref 39–117)
BUN: 18 mg/dL (ref 6–23)
CO2: 25 mEq/L (ref 19–32)
Calcium: 8.9 mg/dL (ref 8.4–10.5)
Chloride: 103 mEq/L (ref 96–112)
Creatinine, Ser: 1.07 mg/dL (ref 0.40–1.50)
GFR: 73.28 mL/min (ref >60.00)
Glucose, Bld: 102 mg/dL — ABNORMAL HIGH (ref 70–99)
Potassium: 4.2 mEq/L (ref 3.5–5.1)
Sodium: 138 mEq/L (ref 135–145)
Total Bilirubin: 0.5 mg/dL (ref 0.2–1.2)
Total Protein: 6.4 g/dL (ref 6.0–8.3)

## 2021-01-26 MED ORDER — OXYCODONE-ACETAMINOPHEN 10-325 MG PO TABS: 1.0000 | ORAL_TABLET | Freq: Three times a day (TID) | ORAL | 0 refills | Status: AC | PRN

## 2021-01-26 MED ORDER — CEFTRIAXONE SODIUM 1 G IJ SOLR: 1.0000 g | Freq: Once | INTRAMUSCULAR | 0 refills | Status: AC

## 2021-01-26 MED ORDER — CEFTRIAXONE SODIUM 1 G IJ SOLR
1.0000 g | Freq: Once | INTRAMUSCULAR | Status: AC
  Administered 2021-01-26: 1 g via INTRAMUSCULAR

## 2021-01-26 NOTE — Patient Instructions (Addendum)
Will get xray of rt hand to see if you have foreign body/fang. Will go ahead and refer you to hand surgeon as even if xray negative may need ct or other imaging. Want to get hand surgeon opinion since finger swelling is severe.    For severe hand pain rx oxycodone tid. He is about to run out. Will give refill and advise increase gabapentin to 2 tab po three times daily.  Continue doxycycline antibiotic and we second injection of rocephin 1 gram. Gave one last visit as well.  Follow up with me on Wednesday if we don't get you in with hand surgeon by then.

## 2021-01-26 NOTE — Progress Notes (Signed)
Subjective:    Patient ID: Richard Walker, male    DOB: 05/05/1956, 65 y.o.   MRN: [id number]  HPI    Pt states his rt  upper arm distal bicep area is no longer tender in prior 2 areas that were tender. Also states  less swollen in forearm area. However  dorsal aspect of hand still hurts  and has dorsal aspect wrist pain. The rt 5th digit is still very swollen and has bright red appearance. Limited flexion of rt 5th digit.  No fever, no chills or sweats.  Pt labs done the other day normal. Normal cbc, cmp, inr and fibrinogen.  Pt is on doxycycline 100 mg twie daily.  For severe hand pain rx oxycodone tid. He is about to run out. Will give refill and advise increase gabapentin to 2 tab po three times daily.    Review of Systems  Constitutional: Negative for chills, fatigue and fever.  Respiratory: Negative for chest tightness and shortness of breath.   Cardiovascular: Negative for palpitations.  Musculoskeletal: Negative for back pain.       Rt 5th digit pain.  Neurological: Negative for dizziness and headaches.       Still having sharp severe pain rt 5th digit of hand and hand dorsal aspect of hand.  Hematological: Negative for adenopathy. Does not bruise/bleed easily.    Past Medical History:  Diagnosis Date  . DDD (degenerative disc disease), lumbar    L3/L4  . Diverticulosis   . GERD (gastroesophageal reflux disease)   . Hyperlipidemia   . Low sperm motility   . Pre-diabetes   . Sleep apnea    uses cpap   . SVT (supraventricular tachycardia) (HCC)      Social History   Socioeconomic History  . Marital status: Married    Spouse name: Not on file  . Number of children: Not on file  . Years of education: Not on file  . Highest education level: Not on file  Occupational History  . Not on file  Tobacco Use  . Smoking status: Former Smoker    Packs/day: 1.00    Years: 10.00    Pack years: 10.00    Types: Cigarettes    Quit date: 1992    Years since  quitting: 30.4  . Smokeless tobacco: Never Used  . Tobacco comment: Uses Hookah Pipe occasionally  Vaping Use  . Vaping Use: Never used  Substance and Sexual Activity  . Alcohol use: No  . Drug use: No  . Sexual activity: Yes  Other Topics Concern  . Not on file  Social History Narrative   Marital Status: Married Engineer, drilling)   Children:  4 (3 Sons/1 Daughter)   Pets:  None    Living Situation: Lives with spouse and 4 children   Origin:  He was born in [place].   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     Alcohol Use:  None   Drug Use:  None   Diet:  Regular   Exercise:  None   Hobbies:  Cards   Social Determinants of Health   Financial Resource Strain: Not on file  Food Insecurity: Not on file  Transportation Needs: Not on file  Physical Activity: Not on file  Stress: Not on file  Social Connections: Not on file  Intimate Partner Violence: Not on file    Past Surgical History:  Procedure  Laterality Date  . 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 REPAIR OF LEFT INDEX AND LONG FINGER BY FUSION /  REPAIR OF RING FINGER;  Surgeon: Mack Hook, MD;  Location: [facility] OR;  Service: Orthopedics;  Laterality: Left;  . BACK SURGERY    . CARDIAC CATHETERIZATION N/A 12/22/2015   Procedure: Left Heart Cath and Coronary Angiography;  Surgeon: Peter M Swaziland, MD;  Location: [facility] INVASIVE CV LAB;  Service: Cardiovascular;  Laterality: N/A;  . CARDIAC ELECTROPHYSIOLOGY STUDY AND ABLATION    . ELECTROPHYSIOLOGIC STUDY N/A 02/19/2016   Procedure: SVT Ablation;  Surgeon: Marinus Maw, MD;  Location: [facility] INVASIVE CV LAB;  Service: Cardiovascular;  Laterality: N/A;  . septal reconstruction  2017  . TURBINATE REDUCTION Bilateral 07/2016  . WISDOM TOOTH EXTRACTION      Family History  Problem Relation Age of Onset  . Hypertension Father   . Food Allergy Son    . Colon cancer Neg Hx   . Stomach cancer Neg Hx   . Allergic rhinitis Neg Hx   . Angioedema Neg Hx   . Asthma Neg Hx   . Eczema Neg Hx   . Immunodeficiency Neg Hx   . Urticaria Neg Hx     Allergies  Allergen Reactions  . Tree Extract   . Pork-Derived Products Swelling  . Statins     Muscle aches    Current Outpatient Medications on File Prior to Visit  Medication Sig Dispense Refill  . acetaminophen (TYLENOL) 325 MG tablet Take 2 tablets (650 mg total) by mouth every 6 (six) hours.    [name] albuterol (PROAIR HFA) 108 (90 Base) MCG/ACT inhaler Inhale 1-2 puffs into the lungs every 6 (six) hours as needed for wheezing or shortness of breath. 8 g 5  . azelastine (ASTELIN) 0.1 % nasal spray 1-2 sprays per nostril 2 times daily as needed. 30 mL 5  . cyclobenzaprine (FLEXERIL) 10 MG tablet Take 1 tablet (10 mg total) by mouth 3 (three) times daily as needed. 45 tablet 0  . diclofenac (VOLTAREN) 75 MG EC tablet Take 1 tablet (75 mg total) by mouth 2 (two) times daily. 90 tablet 2  . doxycycline (VIBRA-TABS) 100 MG tablet Take 1 tablet (100 mg total) by mouth 2 (two) times daily. Can give caps or generic. 20 tablet 0  . ezetimibe (ZETIA) 10 MG tablet Take 1 tablet (10 mg total) by mouth daily. 30 tablet 11  . famotidine-calcium carbonate-magnesium hydroxide (PEPCID COMPLETE) 10-800-165 MG chewable tablet Chew by mouth.    . fenofibrate (TRICOR) 145 MG tablet Take 1 tablet by mouth daily.    . flecainide (TAMBOCOR) 150 MG tablet Take 1 tablet (150 mg total) by mouth 2 (two) times daily. 180 tablet 3  . fluticasone (FLONASE) 50 MCG/ACT nasal spray Place 1 spray into both nostrils daily. 16 g 5  . gabapentin (NEURONTIN) 100 MG capsule Take 1 capsule (100 mg total) by mouth 3 (three) times daily. 90 capsule 0  . ibuprofen (ADVIL) 200 MG tablet Take 3 tablets (600 mg total) by mouth every 6 (six) hours.    [name] levocetirizine (XYZAL) 5 MG tablet TAKE 1 TABLET(5 MG) BY MOUTH EVERY EVENING 30 tablet 11   . Magnesium 250 MG TABS Take 1 tablet by mouth once a week.    . meclizine (ANTIVERT) 25 MG tablet Take 1 tablet (25 mg total) by mouth 3 (three) times daily as needed for dizziness. 15 tablet 0  . meloxicam (MOBIC) 15 MG tablet  Take 1 tablet (15 mg total) by mouth daily. 30 tablet 1  . metFORMIN (GLUCOPHAGE) 500 MG tablet Take 1 tablet (500 mg total) by mouth 2 (two) times daily with a meal. 60 tablet 3  . metoprolol tartrate (LOPRESSOR) 25 MG tablet Take one tablet by mouth twice a day.  May take one additional tablet by mouth as needed for breakthrough palpitations. 270 tablet 3  . montelukast (SINGULAIR) 10 MG tablet TAKE 1 TABLET BY MOUTH 1 TIME AT NIGHT FOR COUGHING OR WHEEZING 30 tablet 5  . Olopatadine HCl 0.2 % SOLN Apply 1 drop to eye daily as needed (allergies). 7.5 mL 3  . pantoprazole (PROTONIX) 40 MG tablet Take 1 tablet (40 mg total) by mouth 2 (two) times daily. 60 tablet 0  . PRESCRIPTION MEDICATION Inhale into the lungs at bedtime. CPAP    . rivaroxaban (XARELTO) 20 MG TABS tablet Take 1 tablet (20 mg total) by mouth daily with supper. 90 tablet 3  . sildenafil (REVATIO) 20 MG tablet Take 1-2 tablets (20-40 mg total) by mouth as needed. 60 tablet 1  . Vitamin D, Ergocalciferol, (DRISDOL) 1.25 MG (50000 UT) CAPS capsule Take 1 capsule (50,000 Units total) by mouth every 7 (seven) days. 4 capsule 0   No current facility-administered medications on file prior to visit.    BP (!) 150/80   Pulse 67   Resp 18   Ht 5\' 9"  (1.753 m)   Wt 257 lb (116.6 kg)   SpO2 96%   BMI 37.95 kg/m       Objective:   Physical Exam   General Mental Status- Alert. General Appearance- Not in acute distress.      Rt hand moderate to severe swelling to fingers and dorsal apect of hand. Redness to rt 5th digit medial aspect. Capillary refill intake. Radial pulse normal. Pain on palpation of hand up to proximal forearm. Mild disoloration proximal forearm. Faint bruised appearance.      Assessment & Plan:  Will get xray of rt hand to see if you have foreign body/fang. Will go ahead and refer you to hand surgeon as even if xray negative may need ct or other imaging. Want to get hand surgeon opinion since swelling is severe.    For severe hand pain rx oxycodone tid. He is about to run out. Will give refill and advise increase gabapentin to 2 tab po three times daily.  Continue doxycycline antibiotic and we second injection of rocephin 1 gram. Gave one last visit as well.  Follow up with me on Wednesday if we don't get you in with hand surgeon by then.

## 2021-01-26 NOTE — Telephone Encounter (Signed)
I put in hand surgeon referral for this pt. Please see that referral. Will you give me update on referral tomorrow in the morning.

## 2021-01-28 ENCOUNTER — Other Ambulatory Visit: Payer: Self-pay

## 2021-01-28 ENCOUNTER — Ambulatory Visit (INDEPENDENT_AMBULATORY_CARE_PROVIDER_SITE_OTHER): Payer: Medicare Other | Admitting: Orthopaedic Surgery

## 2021-01-28 ENCOUNTER — Encounter: Payer: Self-pay | Admitting: Orthopaedic Surgery

## 2021-01-28 DIAGNOSIS — M25541 Pain in joints of right hand: Secondary | ICD-10-CM | POA: Diagnosis not present

## 2021-01-28 DIAGNOSIS — W5911XA Bitten by nonvenomous snake, initial encounter: Secondary | ICD-10-CM | POA: Diagnosis not present

## 2021-01-28 NOTE — Progress Notes (Signed)
Office Visit Note   Patient: Richard Walker           Date of Birth: February 02, 1956           MRN: [id number] Visit Date: 01/28/2021              Requested by: Esperanza Richters, PA-C [address] [place],  [place] [address] PCP: Esperanza Richters, PA-C   Assessment & Plan: Visit Diagnoses:  1. Snake bite, initial encounter     Plan: Impression is Copperhead snake bite of the right small finger that occurred 1 week ago. Overall pain and swelling of the hand are improving. There is no evidence of compartment syndrome, infection or neurovascular compromise. No evidence of retained foreign bodies on x-ray. We recommended Tylenol and NSAIDs and elevation for pain control. He is taking Oxycodone from his PCP. Counseled the patient that the swelling in his hand will continue to go down slowly and with that his pain should improve. No indications for surgery at this time. We'll see him back in 2 weeks for reevaluation.   Follow-Up Instructions: Return in about 2 weeks (around 02/11/2021).   Orders:  No orders of the defined types were placed in this encounter.  No orders of the defined types were placed in this encounter.     Procedures: No procedures performed   Clinical Data: No additional findings.   Subjective: Chief Complaint  Patient presents with  . Right Hand - Pain    HPI Richard Walker is a 65 year old RHD male who presents for evaluation of a snake bite to the right hand. Date of injury was 01/21/21. They determined the snake to be a Copperhead. He was seen in the ED where he was given antivenom. X-rays of the hand demonstrated no foreign bodies. Today he states that pain and swelling have overall been improving. Swelling involves the entire right hand and is most pronounced in the right small finger with some bruising of the finger, no redness or purulence. His PCP but him on Doxycycline and gave him 2 shots of Rocephin. He has been taking oxycodone from his PCP as  needed. He denies fevers, chills, numbness or weakness in the right hand. No muscle cramping or discolored urine.   Review of Systems  Constitutional: Negative for chills, fatigue and fever.  Gastrointestinal: Negative.   Musculoskeletal: Negative for myalgias.     Objective: Vital Signs: There were no vitals taken for this visit.  Physical Exam  Ortho Exam Inspection of the right hand reveals significant swelling concentrated to the right ulnar sided hand and small finger. Ecchymosis of the small finger proximal to the PIP joint. There appears to be two puncture wounds distal and medial to the PIP joint. No erythema of the small finger or hand. He is very tender to palpation along the entire small finger. He has limited ROM of the small finger secondary to pain and swelling. Capillary refill is less than 2 seconds. Brisk radial pulse.   Specialty Comments:  No specialty comments available.  Imaging: Radiographs of the right small finger dated 01/26/21 were reviewed during today's visit and show no foreign bodies or fractures. Small tissue swelling of the small finger is present.   PMFS History: Patient Active Problem List   Diagnosis Date Noted  . Bite, snake 01/28/2021  . Neck pain 11/13/2020  . Vertigo 11/13/2020  . Hypertension 11/03/2020  . Contusion of bone 10/23/2020  . Degenerative tear of left  medial meniscus 10/23/2020  . Concussion with no loss of consciousness 10/23/2020  . Paroxysmal atrial fibrillation (HCC) 05/22/2019  . Unspecified atrial flutter (HCC) 05/22/2019  . Pain in right foot 03/30/2018  . Perennial and seasonal allergic rhinitis 11/09/2017  . History of food allergy 11/09/2017  . Wheezing 11/09/2017  . Dyspnea/wheezing 10/31/2017  . Cough 10/31/2017  . Bronchitis, mucopurulent recurrent (HCC) 09/07/2017  . SVT (supraventricular tachycardia) (HCC) 04/23/2015  . Abdominal pain, epigastric 06/03/2013  . Unspecified vitamin D deficiency 04/09/2013  .  Esophageal reflux 04/09/2013  . Hepatitis B carrier (HCC) 04/09/2013  . Other and unspecified hyperlipidemia 04/09/2013  . Unspecified sleep apnea 04/09/2013   Past Medical History:  Diagnosis Date  . DDD (degenerative disc disease), lumbar    L3/L4  . Diverticulosis   . GERD (gastroesophageal reflux disease)   . Hyperlipidemia   . Low sperm motility   . Pre-diabetes   . Sleep apnea    uses cpap   . SVT (supraventricular tachycardia) (HCC)     Family History  Problem Relation Age of Onset  . Hypertension Father   . Food Allergy Son   . Colon cancer Neg Hx   . Stomach cancer Neg Hx   . Allergic rhinitis Neg Hx   . Angioedema Neg Hx   . Asthma Neg Hx   . Eczema Neg Hx   . Immunodeficiency Neg Hx   . Urticaria Neg Hx     Past Surgical History:  Procedure Laterality Date  . 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 REPAIR OF LEFT INDEX AND LONG FINGER BY FUSION /  REPAIR OF RING FINGER;  Surgeon: Mack Hook, MD;  Location: [facility] OR;  Service: Orthopedics;  Laterality: Left;  . BACK SURGERY    . CARDIAC CATHETERIZATION N/A 12/22/2015   Procedure: Left Heart Cath and Coronary Angiography;  Surgeon: Peter M Swaziland, MD;  Location: [facility] INVASIVE CV LAB;  Service: Cardiovascular;  Laterality: N/A;  . CARDIAC ELECTROPHYSIOLOGY STUDY AND ABLATION    . ELECTROPHYSIOLOGIC STUDY N/A 02/19/2016   Procedure: SVT Ablation;  Surgeon: Marinus Maw, MD;  Location: [facility] INVASIVE CV LAB;  Service: Cardiovascular;  Laterality: N/A;  . septal reconstruction  2017  . TURBINATE REDUCTION Bilateral 07/2016  . WISDOM TOOTH EXTRACTION     Social History   Occupational History  . Not on file  Tobacco Use  . Smoking status: Former Smoker    Packs/day: 1.00    Years: 10.00    Pack years: 10.00    Types: Cigarettes    Quit date: 1992    Years since quitting: 30.4  . Smokeless tobacco: Never Used  . Tobacco comment: Uses Hookah Pipe occasionally  Vaping Use  . Vaping Use: Never used  Substance  and Sexual Activity  . Alcohol use: No  . Drug use: No  . Sexual activity: Yes

## 2021-01-29 ENCOUNTER — Encounter: Payer: Medicare Other | Admitting: Physical Therapy

## 2021-02-03 ENCOUNTER — Telehealth: Payer: Self-pay | Admitting: Medical

## 2021-02-03 NOTE — Telephone Encounter (Signed)
Patient came in after PT appt   PT will not allow him to continue visits unless he is released from [facility]   Do we need to see him in office for that or can we send a release letter?  Please advise  Patient would like to be call when we find out [phone]

## 2021-02-04 ENCOUNTER — Telehealth: Payer: Self-pay | Admitting: Medical

## 2021-02-04 NOTE — Telephone Encounter (Signed)
Printed letter for return to PT.

## 2021-02-04 NOTE — Telephone Encounter (Signed)
Called pt to ask the questions , he stated he was unaware of the name of the PT and also what was needed for the note , stated he would call me back with the number to office so I can speak with someone directly

## 2021-02-04 NOTE — Telephone Encounter (Signed)
Pt ,called to follow up on the doctor note so he can resume therapy   Please advice

## 2021-02-04 NOTE — Telephone Encounter (Signed)
Spoke with front desk at [facility] rehab , stated they just need a letter stating patient is cleared to do PT because he had swelling in his arm from the snake bite and the snake bite issue is resolved .   Call back: [phone]

## 2021-02-05 NOTE — Telephone Encounter (Signed)
Form taken across the hall to rehab

## 2021-02-11 ENCOUNTER — Ambulatory Visit: Payer: Medicare Other | Admitting: Orthopaedic Surgery

## 2021-02-16 ENCOUNTER — Ambulatory Visit: Payer: Medicare Other | Attending: Family Medicine | Admitting: Physical Therapy

## 2021-02-16 ENCOUNTER — Encounter: Payer: Self-pay | Admitting: Physical Therapy

## 2021-02-16 ENCOUNTER — Other Ambulatory Visit: Payer: Self-pay

## 2021-02-16 DIAGNOSIS — M25561 Pain in right knee: Secondary | ICD-10-CM | POA: Insufficient documentation

## 2021-02-16 DIAGNOSIS — M6281 Muscle weakness (generalized): Secondary | ICD-10-CM | POA: Diagnosis present

## 2021-02-16 DIAGNOSIS — G8929 Other chronic pain: Secondary | ICD-10-CM | POA: Diagnosis present

## 2021-02-16 DIAGNOSIS — M25641 Stiffness of right hand, not elsewhere classified: Secondary | ICD-10-CM | POA: Insufficient documentation

## 2021-02-16 DIAGNOSIS — M25562 Pain in left knee: Secondary | ICD-10-CM | POA: Diagnosis present

## 2021-02-16 DIAGNOSIS — M545 Low back pain, unspecified: Secondary | ICD-10-CM | POA: Insufficient documentation

## 2021-02-16 DIAGNOSIS — R262 Difficulty in walking, not elsewhere classified: Secondary | ICD-10-CM | POA: Insufficient documentation

## 2021-02-16 DIAGNOSIS — M79641 Pain in right hand: Secondary | ICD-10-CM | POA: Insufficient documentation

## 2021-02-16 DIAGNOSIS — M5412 Radiculopathy, cervical region: Secondary | ICD-10-CM | POA: Diagnosis present

## 2021-02-16 DIAGNOSIS — R42 Dizziness and giddiness: Secondary | ICD-10-CM | POA: Diagnosis present

## 2021-02-16 NOTE — Therapy (Signed)
[facility] [address]  [place], [place], [address] Phone: [phone]   Fax:  [phone]  Physical Therapy Progress Note  Patient Details  Name: LAMONDRE WESCHE MRN: [id number] Date of Birth: 04-03-1956 Referring Provider (PT): Clearance Coots, MD   Progress Note Reporting Period 12/23/20 to 02/16/21  See note below for Objective Data and Assessment of Progress/Goals.    Encounter Date: 02/16/2021   PT End of Session - 02/16/21 1400     Visit Number 20    Number of Visits 32    Date for PT Re-Evaluation 03/30/21    Authorization Type Medicare & Traditional Medicaid    PT Start Time 1309    PT Stop Time 1354    PT Time Calculation (min) 45 min    Activity Tolerance Patient tolerated treatment well;Patient limited by pain    Behavior During Therapy WFL for tasks assessed/performed             Past Medical History:  Diagnosis Date   DDD (degenerative disc disease), lumbar    L3/L4   Diverticulosis    GERD (gastroesophageal reflux disease)    Hyperlipidemia    Low sperm motility    Pre-diabetes    Sleep apnea    uses cpap    SVT (supraventricular tachycardia) ([place])     Past Surgical History:  Procedure Laterality Date   AMPUTATION Left 07/15/2018   Procedure: . REPAIR OF LEFT INDEX AND LONG FINGER BY FUSION /  REPAIR OF RING FINGER;  Surgeon: Milly Jakob, MD;  Location: [facility];  Service: Orthopedics;  Laterality: Left;   BACK SURGERY     CARDIAC CATHETERIZATION N/A 12/22/2015   Procedure: Left Heart Cath and Coronary Angiography;  Surgeon: Peter M Martinique, MD;  Location: [facility] CV LAB;  Service: Cardiovascular;  Laterality: N/A;   CARDIAC ELECTROPHYSIOLOGY STUDY AND ABLATION     ELECTROPHYSIOLOGIC STUDY N/A 02/19/2016   Procedure: SVT Ablation;  Surgeon: Evans Lance, MD;  Location: [facility] CV LAB;  Service: Cardiovascular;  Laterality: N/A;   septal reconstruction  2017   TURBINATE  REDUCTION Bilateral 07/2016   WISDOM TOOTH EXTRACTION      There were no vitals filed for this visit.   Subjective Assessment - 02/16/21 1310     Subjective Feeling better since his R 5th digit was bitten by a copperhead. Feels better but still having pain and swelling. Notes that while he was on strong pain meds for the snake bite, his other areas of pain were less of a problem. Has started weaning down on these meds.    Pertinent History none    Diagnostic tests Per MD- "Independent review of the CT lumbar spine from 2/15 shows stable interbody fusion at L4/5 and degenerative disc disease at L5-S1.    Independent review of the CT cervical spine 2/15 shows severe degenerative change at C5-6.    Independent review the B knee x-ray from 2/15 shows no acute changes."    Patient Stated Goals decrease pain and dizziness    Pain Score 6     Pain Location Hand   5th digit   Pain Orientation Right    Pain Descriptors / Indicators Aching    Pain Type Acute pain    Pain Score 4    Pain Location Knee    Pain Orientation Right;Left    Pain Descriptors / Indicators Aching    Pain Type Chronic pain  [facility] PT Assessment - 02/16/21 1320       Assessment   Medical Diagnosis Contusion of bone, degenerative tear of L medial meniscus, concussion without LOC    Referring Provider (PT) Clearance Coots, MD      AROM   Cervical Flexion 52    Cervical Extension 51    Cervical - Right Side Bend 45    Cervical - Left Side Bend 34    Cervical - Right Rotation 57    Cervical - Left Rotation 55      Strength   Right Shoulder Flexion 4/5    Right Shoulder ABduction 4+/5    Right Shoulder Internal Rotation 4+/5    Right Shoulder External Rotation 4+/5    Left Shoulder Flexion 4/5    Left Shoulder ABduction 4+/5    Left Shoulder Internal Rotation 4+/5    Left Shoulder External Rotation 4+/5    Right Elbow Flexion 4+/5    Right Elbow Extension 4+/5    Left Elbow Flexion 4+/5     Left Elbow Extension 4+/5    Right Wrist Flexion 4/5    Right Wrist Extension 4/5    Left Wrist Flexion 4/5    Left Wrist Extension 4/5    Right Hand Grip (lbs) 18.33   25, 10, 20   Left Hand Grip (lbs) 26   25, 27, 26   Right Hip Flexion 5/5    Right Hip ABduction 4+/5    Right Hip ADduction 4+/5    Left Hip Flexion 5/5    Left Hip ABduction 4+/5    Left Hip ADduction 4+/5    Right Knee Flexion 4/5    Right Knee Extension 4/5    Left Knee Flexion 4/5    Left Knee Extension 4/5                           [facility] Adult PT Treatment/Exercise - 02/16/21 0001       Knee/Hip Exercises: Seated   Long Arc Quad Strengthening;Right;Left;1 set;10 reps    Long [employer] Limitations with yellow TB   B knee pain but tolerable   Hamstring Curl Strengthening;Both;1 set;10 reps    Hamstring Limitations green band   cues for decreased speed     Shoulder Exercises: Seated   Flexion Strengthening;Right;Left;10 reps;Theraband    Theraband Level (Shoulder Flexion) Level 3 (Green)    Flexion Limitations thumb up      Hand Exercises for Cervical Radiculopathy   Gross Grasp green putty x2 min    Pinch Grip green putty pull aparts x2 min    Other Hand Exercise for Cervical Radiculopathy demo and practice with R 5th digit flex/ex PROM to tolerance                    PT Education - 02/16/21 1355     Education Details update to HEP; discussion on objective progress and remaining impairments    Person(s) Educated Patient    Methods Explanation;Demonstration;Tactile cues;Verbal cues;Handout    Comprehension Verbalized understanding;Returned demonstration;Verbal cues required              PT Short Term Goals - 02/16/21 1501       PT SHORT TERM GOAL #1   Title Patient to be independent with initial HEP.    Time 3    Period Weeks    Status Achieved    Target Date 11/26/20  PT Long Term Goals - 02/16/21 1501       PT LONG TERM GOAL #1    Title Patient to be independent with advanced HEP.    Time 6    Period Weeks    Status Partially Met   met for current   Target Date 03/30/21      PT LONG TERM GOAL #2   Title Patient to demonstrate L UE strength >/=4+/5.    Time 6    Period Weeks    Status Partially Met   still limited in B shoulder flexion, B wrist, B grip   Target Date 03/30/21      PT LONG TERM GOAL #3   Title Patient to demonstrate B LE strength >/=4+/5.    Time 6    Period Weeks    Status Partially Met   improved in hips, still limited in B knees   Target Date 03/30/21      PT LONG TERM GOAL #4   Title Patient to demonstrate cervical AROM WFL and without pain limiting.    Time 8    Period Weeks    Status Achieved   improved in flexion, extension, and R sidebending     PT LONG TERM GOAL #5   Title Patient to report tolerance for kneeling to pray d/t improvement in pain levels.    Time 6    Period Weeks    Status On-going   unable d/t B knee pain   Target Date 03/30/21      PT LONG TERM GOAL #6   Title Patient to report tolerance for 2 hours on his feet without pain limiting.    Time 6    Period Weeks    Status On-going   reports 15-20 min   Target Date 03/30/21      PT LONG TERM GOAL #7   Title Patient to report 90% improvement in dizziness.    Time 8    Period Weeks    Status Achieved   reports 90% improvement                  Plan - 02/16/21 1401     Clinical Impression Statement Patient returned to PT today after experiencing a Copperhead snake bite to the R 5th digit on 01/21/21. R digit still appears edematous and patient still notes pain and difficulty with finger mobility. Noting that since being on strong pain meds for the bite, his other areas of pain have been less problematic, however is in the process of weaning off these meds. Notes that for the last 3 days, he is able to tolerate 15-20 min, however feels that this is d/t the strong pain meds that he has been taking.  Reports 90% improvement in dizziness. However, still unable to kneel to pray d/t B knee pain. Cervical AROM is now functional and nonpainful. UE strength is improved in shoulder and elbow strength. Still with weakness evident in B shoulder flexion, B wrist, and grip strength. LE strength is improved in B hip strength, however weakness still evident in B knee flexion/extension. Patient performed grip strengthening with putty and was educated on gentle 5th digit PROM to tolerance for recovery of grip strength. Also performed HS and quad strengthening with c/o mild B knee pain, but tolerable. Patient continues to demonstrate improvement towards goals, with snake bite as recent barrier to progress. Would continue to benefit from skilled PT services 2x/week for 6 weeks to address B wrist and hand weakness,  knee strength, and ability to kneel for praying.    Personal Factors and Comorbidities Age;Comorbidity 1;Fitness;Past/Current Experience;Time since onset of injury/illness/exacerbation    Comorbidities back surgery L3-4 1997    PT Frequency 2x / week    PT Duration 6 weeks    PT Treatment/Interventions ADLs/Self Care Home Management;Canalith Repostioning;Cryotherapy;Electrical Stimulation;Iontophoresis 60m/ml Dexamethasone;Moist Heat;Traction;Balance training;Therapeutic exercise;Therapeutic activities;Functional mobility training;Stair training;Gait training;Ultrasound;Neuromuscular re-education;Patient/family education;Manual techniques;Vestibular;Vasopneumatic Device;Taping;Energy conservation;Dry needling;Passive range of motion;Scar mobilization    PT Next Visit Plan address B wrist and hand weakness, knee strength, and ability to kneel for praying.    Consulted and Agree with Plan of Care Patient             Patient will benefit from skilled therapeutic intervention in order to improve the following deficits and impairments:  Abnormal gait, Hypomobility, Decreased activity tolerance, Decreased  strength, Increased fascial restricitons, Impaired UE functional use, Pain, Difficulty walking, Decreased balance, Increased muscle spasms, Improper body mechanics, Dizziness, Decreased range of motion, Impaired flexibility, Postural dysfunction  Visit Diagnosis: Chronic pain of left knee  Chronic pain of right knee  Bilateral low back pain, unspecified chronicity, unspecified whether sciatica present  Muscle weakness (generalized)  Difficulty in walking, not elsewhere classified  Pain in right hand  Stiffness of right hand, not elsewhere classified     Problem List Patient Active Problem List   Diagnosis Date Noted   Bite, snake 01/28/2021   Neck pain 11/13/2020   Vertigo 11/13/2020   Hypertension 11/03/2020   Contusion of bone 10/23/2020   Degenerative tear of left medial meniscus 10/23/2020   Concussion with no loss of consciousness 10/23/2020   [place]oxysmal atrial fibrillation ([place] 05/22/2019   Unspecified atrial flutter (HCC) 05/22/2019   Pain in right foot 03/30/2018   Perennial and seasonal allergic rhinitis 11/09/2017   History of food allergy 11/09/2017   Wheezing 11/09/2017   Dyspnea/wheezing 10/31/2017   Cough 10/31/2017   Bronchitis, mucopurulent recurrent (HCC) 09/07/2017   SVT (supraventricular tachycardia) (HCC) 04/23/2015   Abdominal pain, epigastric 06/03/2013   Unspecified vitamin D deficiency 04/09/2013   Esophageal reflux 04/09/2013   Hepatitis B carrier ([place] 04/09/2013   Other and unspecified hyperlipidemia 04/09/2013   Unspecified sleep apnea 04/09/2013     YJanene Harvey PT, DPT 02/16/21 3:07 PM    [facility] [address] [place] [address]Phone: [phone]  Fax:  [phone] Name: STAEGEN DELKERMRN: [id number] of Birth: 111/23/1957

## 2021-02-17 ENCOUNTER — Encounter: Payer: Self-pay | Admitting: Orthopaedic Surgery

## 2021-02-17 ENCOUNTER — Ambulatory Visit (INDEPENDENT_AMBULATORY_CARE_PROVIDER_SITE_OTHER): Payer: Medicare Other | Admitting: Orthopaedic Surgery

## 2021-02-17 DIAGNOSIS — M25541 Pain in joints of right hand: Secondary | ICD-10-CM

## 2021-02-17 DIAGNOSIS — W5911XA Bitten by nonvenomous snake, initial encounter: Secondary | ICD-10-CM

## 2021-02-17 MED ORDER — HYDROCODONE-ACETAMINOPHEN 5-325 MG PO TABS: 1.0000 | ORAL_TABLET | Freq: Every day | ORAL | 0 refills | Status: DC | PRN

## 2021-02-17 NOTE — Progress Notes (Signed)
Patient: Richard Walker           Date of Birth: [birth year]           MRN: [id number] Visit Date: 02/17/2021 PCP: Esperanza Richters, PA-C   Assessment & Plan:  Chief Complaint:  Chief Complaint  Patient presents with   Right Little Finger - Pain, Follow-up   Visit Diagnoses:  1. Snake bite, initial encounter     Plan: Jalik is following up today status post snake bite to the right small finger.  I saw him 2 weeks ago.  He states that he is better but pain is still there.  He has noticed improvement in swelling and range of motion.  He is doing some exercises on his own at home  Swelling of the right small finger is moderate.  This is improved and his range of motion is improving.  No neurovascular compromise.  Slight tenderness near where the bite was.  Reassurance was provided that this is normal for snakebite.  This was examined with the results.  Hydrocodone was refilled.  Follow-Up Instructions: Return if symptoms worsen or fail to improve.   Orders:  No orders of the defined types were placed in this encounter.  Meds ordered this encounter  Medications   HYDROcodone-acetaminophen (NORCO) 5-325 MG tablet    Sig: Take 1 tablet by mouth daily as needed.    Dispense:  20 tablet    Refill:  0    Imaging: No results found.  PMFS History: Patient Active Problem List   Diagnosis Date Noted   Bite, snake 01/28/2021   Neck pain 11/13/2020   Vertigo 11/13/2020   Hypertension 11/03/2020   Contusion of bone 10/23/2020   Degenerative tear of left medial meniscus 10/23/2020   Concussion with no loss of consciousness 10/23/2020   Paroxysmal atrial fibrillation (HCC) 05/22/2019   Unspecified atrial flutter (HCC) 05/22/2019   Pain in right foot 03/30/2018   Perennial and seasonal allergic rhinitis 11/09/2017   History of food allergy 11/09/2017   Wheezing 11/09/2017   Dyspnea/wheezing 10/31/2017   Cough 10/31/2017   Bronchitis, mucopurulent recurrent (HCC) 09/07/2017   SVT  (supraventricular tachycardia) (HCC) 04/23/2015   Abdominal pain, epigastric 06/03/2013   Unspecified vitamin D deficiency 04/09/2013   Esophageal reflux 04/09/2013   Hepatitis B carrier (HCC) 04/09/2013   Other and unspecified hyperlipidemia 04/09/2013   Unspecified sleep apnea 04/09/2013   Past Medical History:  Diagnosis Date   DDD (degenerative disc disease), lumbar    L3/L4   Diverticulosis    GERD (gastroesophageal reflux disease)    Hyperlipidemia    Low sperm motility    Pre-diabetes    Sleep apnea    uses cpap    SVT (supraventricular tachycardia) (HCC)     Family History  Problem Relation Age of Onset   Hypertension Father    Food Allergy Son    Colon cancer Neg Hx    Stomach cancer Neg Hx    Allergic rhinitis Neg Hx    Angioedema Neg Hx    Asthma Neg Hx    Eczema Neg Hx    Immunodeficiency Neg Hx    Urticaria Neg Hx     Past Surgical History:  Procedure Laterality Date   AMPUTATION Left 07/15/2018   Procedure: . REPAIR OF LEFT INDEX AND LONG FINGER BY FUSION /  REPAIR OF RING FINGER;  Surgeon: Mack Hook, MD;  Location: [facility] OR;  Service: Orthopedics;  Laterality: Left;   BACK SURGERY  CARDIAC CATHETERIZATION N/A 12/22/2015   Procedure: Left Heart Cath and Coronary Angiography;  Surgeon: Peter M Swaziland, MD;  Location: [facility] INVASIVE CV LAB;  Service: Cardiovascular;  Laterality: N/A;   CARDIAC ELECTROPHYSIOLOGY STUDY AND ABLATION     ELECTROPHYSIOLOGIC STUDY N/A 02/19/2016   Procedure: SVT Ablation;  Surgeon: Marinus Maw, MD;  Location: [facility] INVASIVE CV LAB;  Service: Cardiovascular;  Laterality: N/A;   septal reconstruction  2017   TURBINATE REDUCTION Bilateral 07/2016   WISDOM TOOTH EXTRACTION     Social History   Occupational History   Not on file  Tobacco Use   Smoking status: Former    Packs/day: 1.00    Years: 10.00    Pack years: 10.00    Types: Cigarettes    Quit date: 1992    Years since quitting: 30.5   Smokeless tobacco: Never    Tobacco comments:    Uses Hookah Pipe occasionally  Vaping Use   Vaping Use: Never used  Substance and Sexual Activity   Alcohol use: No   Drug use: No   Sexual activity: Yes

## 2021-02-18 ENCOUNTER — Other Ambulatory Visit: Payer: Self-pay

## 2021-02-18 ENCOUNTER — Ambulatory Visit: Payer: Medicare Other

## 2021-02-18 DIAGNOSIS — R42 Dizziness and giddiness: Secondary | ICD-10-CM

## 2021-02-18 DIAGNOSIS — M25562 Pain in left knee: Secondary | ICD-10-CM

## 2021-02-18 DIAGNOSIS — M25641 Stiffness of right hand, not elsewhere classified: Secondary | ICD-10-CM

## 2021-02-18 DIAGNOSIS — M5412 Radiculopathy, cervical region: Secondary | ICD-10-CM

## 2021-02-18 DIAGNOSIS — M25561 Pain in right knee: Secondary | ICD-10-CM

## 2021-02-18 DIAGNOSIS — M6281 Muscle weakness (generalized): Secondary | ICD-10-CM

## 2021-02-18 DIAGNOSIS — M79641 Pain in right hand: Secondary | ICD-10-CM

## 2021-02-18 DIAGNOSIS — R262 Difficulty in walking, not elsewhere classified: Secondary | ICD-10-CM

## 2021-02-18 DIAGNOSIS — M545 Low back pain, unspecified: Secondary | ICD-10-CM

## 2021-02-18 NOTE — Therapy (Signed)
[facility] [address]  [place], [place], [address] Phone: [phone]   Fax:  [phone]  Physical Therapy Treatment  Patient Details  Name: Richard Walker MRN: [id number] Date of Birth: Dec 31, 1955 Referring Provider (PT): Clearance Coots, MD   Encounter Date: 02/18/2021   PT End of Session - 02/18/21 1359     Visit Number 21    Number of Visits 32    Date for PT Re-Evaluation 03/30/21    Authorization Type Medicare & Traditional Medicaid    PT Start Time 1315    PT Stop Time 1355    PT Time Calculation (min) 40 min    Activity Tolerance Patient tolerated treatment well;Patient limited by pain    Behavior During Therapy [facility] for tasks assessed/performed             Past Medical History:  Diagnosis Date   DDD (degenerative disc disease), lumbar    L3/L4   Diverticulosis    GERD (gastroesophageal reflux disease)    Hyperlipidemia    Low sperm motility    Pre-diabetes    Sleep apnea    uses cpap    SVT (supraventricular tachycardia) ([place])     Past Surgical History:  Procedure Laterality Date   AMPUTATION Left 07/15/2018   Procedure: . REPAIR OF LEFT INDEX AND LONG FINGER BY FUSION /  REPAIR OF RING FINGER;  Surgeon: Milly Jakob, MD;  Location: [facility];  Service: Orthopedics;  Laterality: Left;   BACK SURGERY     CARDIAC CATHETERIZATION N/A 12/22/2015   Procedure: Left Heart Cath and Coronary Angiography;  Surgeon: Peter M Martinique, MD;  Location: [facility];  Service: Cardiovascular;  Laterality: N/A;   CARDIAC ELECTROPHYSIOLOGY STUDY AND ABLATION     ELECTROPHYSIOLOGIC STUDY N/A 02/19/2016   Procedure: SVT Ablation;  Surgeon: Evans Lance, MD;  Location: [facility];  Service: Cardiovascular;  Laterality: N/A;   septal reconstruction  2017   TURBINATE REDUCTION Bilateral 07/2016   WISDOM TOOTH EXTRACTION      There were no vitals filed for this visit.   Subjective Assessment -  02/18/21 1317     Subjective Has been feeling good d/t being on high pain meds. Still pain and swelling in the R hand.    Pertinent History none    Diagnostic tests Per MD- "Independent review of the CT lumbar spine from 2/15 shows stable interbody fusion at L4/5 and degenerative disc disease at L5-S1.    Independent review of the CT cervical spine 2/15 shows severe degenerative change at C5-6.    Independent review the B knee x-ray from 2/15 shows no acute changes."    Patient Stated Goals decrease pain and dizziness    Currently in Pain? Yes    Pain Score 5     Pain Location Knee    Pain Orientation Right;Left    Pain Descriptors / Indicators Aching    Pain Type Acute pain    Pain Score 5    Pain Location Hand    Pain Orientation Right    Pain Descriptors / Indicators Aching                               [facility] Adult PT Treatment/Exercise - 02/18/21 0001       Lumbar Exercises: Supine   Bridge 10 reps;5 seconds    Bridge Limitations arms crossed  Bridge with [employer] 10 reps;3 seconds      Knee/Hip Exercises: Machines for Strengthening   Cybex Knee Extension 20# 2x10 reps B LE    Cybex Knee Flexion 25# 2x10 reps      Knee/Hip Exercises: Standing   Hip ADduction Strengthening;Both;2 sets;10 reps    Hip ADduction Limitations 2# weight back against counter    Hip Abduction Stengthening;Both;10 reps;Knee straight    Abduction Limitations 2# weight back against counter    Wall Squat 20 reps;3 seconds    Wall Squat Limitations 10 reps with 3" holds      Knee/Hip Exercises: Seated   Long Arc Quad Strengthening;Both;10 reps    Long Arc Quad Limitations ball squeezing 3"hold      Knee/Hip Exercises: Supine   Straight Leg Raises Strengthening;Both;2 sets;10 reps                      PT Short Term Goals - 02/16/21 1501       PT SHORT TERM GOAL #1   Title Patient to be independent with initial HEP.    Time 3    Period Weeks    Status  Achieved    Target Date 11/26/20               PT Long Term Goals - 02/16/21 1501       PT LONG TERM GOAL #1   Title Patient to be independent with advanced HEP.    Time 6    Period Weeks    Status Partially Met   met for current   Target Date 03/30/21      PT LONG TERM GOAL #2   Title Patient to demonstrate L UE strength >/=4+/5.    Time 6    Period Weeks    Status Partially Met   still limited in B shoulder flexion, B wrist, B grip   Target Date 03/30/21      PT LONG TERM GOAL #3   Title Patient to demonstrate B LE strength >/=4+/5.    Time 6    Period Weeks    Status Partially Met   improved in hips, still limited in B knees   Target Date 03/30/21      PT LONG TERM GOAL #4   Title Patient to demonstrate cervical AROM WFL and without pain limiting.    Time 8    Period Weeks    Status Achieved   improved in flexion, extension, and R sidebending     PT LONG TERM GOAL #5   Title Patient to report tolerance for kneeling to pray d/t improvement in pain levels.    Time 6    Period Weeks    Status On-going   unable d/t B knee pain   Target Date 03/30/21      PT LONG TERM GOAL #6   Title Patient to report tolerance for 2 hours on his feet without pain limiting.    Time 6    Period Weeks    Status On-going   reports 15-20 min   Target Date 03/30/21      PT LONG TERM GOAL #7   Title Patient to report 90% improvement in dizziness.    Time 8    Period Weeks    Status Achieved   reports 90% improvement                  Plan - 02/18/21 1400     Clinical Impression  Statement Pt still noting swelling and pain in his R hand but improvements with pain meds. Told him to continue with hand and finger exercises at home as tolerated. He demonstrated good tolerance for the exercises but instruction needed to prevent pelvic rotation with standing hip exercises. He notes continued difficulty with kneelin to pray on the ground and really wants to get back to doing  this. Did mini wall squats today avoiding painful arcs of motion. Afterwards he reported the exercises were good today and hit the correct muscles. He responded well.    Personal Factors and Comorbidities Age;Comorbidity 1;Fitness;Past/Current Experience;Time since onset of injury/illness/exacerbation    Comorbidities back surgery L3-4 1997    PT Frequency 2x / week    PT Duration 6 weeks    PT Treatment/Interventions ADLs/Self Care Home Management;Canalith Repostioning;Cryotherapy;Electrical Stimulation;Iontophoresis 13m/ml Dexamethasone;Moist Heat;Traction;Balance training;Therapeutic exercise;Therapeutic activities;Functional mobility training;Stair training;Gait training;Ultrasound;Neuromuscular re-education;Patient/family education;Manual techniques;Vestibular;Vasopneumatic Device;Taping;Energy conservation;Dry needling;Passive range of motion;Scar mobilization    PT Next Visit Plan address B wrist and hand weakness, knee strength, and ability to kneel for praying.    Consulted and Agree with Plan of Care Patient             Patient will benefit from skilled therapeutic intervention in order to improve the following deficits and impairments:  Abnormal gait, Hypomobility, Decreased activity tolerance, Decreased strength, Increased fascial restricitons, Impaired UE functional use, Pain, Difficulty walking, Decreased balance, Increased muscle spasms, Improper body mechanics, Dizziness, Decreased range of motion, Impaired flexibility, Postural dysfunction  Visit Diagnosis: Chronic pain of left knee  Chronic pain of right knee  Bilateral low back pain, unspecified chronicity, unspecified whether sciatica present  Muscle weakness (generalized)  Difficulty in walking, not elsewhere classified  Pain in right hand  Stiffness of right hand, not elsewhere classified  Radiculopathy, cervical region  Acute pain of right knee  Dizziness and giddiness     Problem List Patient Active  Problem List   Diagnosis Date Noted   Bite, snake 01/28/2021   Neck pain 11/13/2020   Vertigo 11/13/2020   Hypertension 11/03/2020   Contusion of bone 10/23/2020   Degenerative tear of left medial meniscus 10/23/2020   Concussion with no loss of consciousness 10/23/2020   Paroxysmal atrial fibrillation ([facility] 05/22/2019   Unspecified atrial flutter ([facility] 05/22/2019   Pain in right foot 03/30/2018   Perennial and seasonal allergic rhinitis 11/09/2017   History of food allergy 11/09/2017   Wheezing 11/09/2017   Dyspnea/wheezing 10/31/2017   Cough 10/31/2017   Bronchitis, mucopurulent recurrent ([facility] 09/07/2017   SVT (supraventricular tachycardia) ([facility] 04/23/2015   Abdominal pain, epigastric 06/03/2013   Unspecified vitamin D deficiency 04/09/2013   Esophageal reflux 04/09/2013   Hepatitis B carrier ([facility] 04/09/2013   Other and unspecified hyperlipidemia 04/09/2013   Unspecified sleep apnea 04/09/2013    BArtist Pais PTA 02/18/2021, 2:48 PM  [facility] [place] [address]Phone: [phone]  Fax:  [phone] Name: Richard GITLINMRN: [id number] of Birth: 118-Jan-1957

## 2021-02-19 ENCOUNTER — Ambulatory Visit (INDEPENDENT_AMBULATORY_CARE_PROVIDER_SITE_OTHER): Payer: Medicare Other | Admitting: Neurology

## 2021-02-19 ENCOUNTER — Encounter: Payer: Self-pay | Admitting: Neurology

## 2021-02-19 VITALS — BP 136/83 | HR 70 | Ht 69.5 in | Wt 255.0 lb

## 2021-02-19 DIAGNOSIS — G4733 Obstructive sleep apnea (adult) (pediatric): Secondary | ICD-10-CM | POA: Diagnosis not present

## 2021-02-19 DIAGNOSIS — I471 Supraventricular tachycardia: Secondary | ICD-10-CM | POA: Diagnosis not present

## 2021-02-19 DIAGNOSIS — R0600 Dyspnea, unspecified: Secondary | ICD-10-CM

## 2021-02-19 DIAGNOSIS — J411 Mucopurulent chronic bronchitis: Secondary | ICD-10-CM

## 2021-02-19 DIAGNOSIS — I480 Paroxysmal atrial fibrillation: Secondary | ICD-10-CM

## 2021-02-19 DIAGNOSIS — Z9989 Dependence on other enabling machines and devices: Secondary | ICD-10-CM | POA: Diagnosis not present

## 2021-02-19 DIAGNOSIS — R0609 Other forms of dyspnea: Secondary | ICD-10-CM

## 2021-02-19 NOTE — Progress Notes (Signed)
PATIENT: Richard Walker DOB: 13-Mar-1956  REASON FOR VISIT: follow up HISTORY FROM: patient  HISTORY OF PRESENT ILLNESS:  02/19/21 - RV for Mr. [name], who is a 65 year old cardiac patient with OSA on CPAP. The patient endorsed a high degree of fatigue today at 63 points to the maximum on the fatigue severity score his Epworth Sleepiness Scale was endorsed at 15 points, he is also endorsing some points of possible depression 6 out of 15.  This is based on the geriatric depression score from [employer].  His compliance is excellent 84% he is using the machine usually on average 7 hours 23 minutes at night he has an auto CPAP mean pressure use of 7.5 cm and the device pressure over 90% of the time was around 9 cm water.  His residual AHI or apnea hypopnea index, is at 1.0 he does have some air leakage up to 2 hours each night but that seems only to have been playing a role since late June - so may be his CPAP is currently in need of being replaced.  HST and new machine order will be needed.      [name] is a 65 year old male patient from [place], and had Covid 19 last year, his mother in [place] passed away from it last month. [name] has become a very highly compliant CPAP user now that he has recovered from Covid he is a 97% compliant user by hours with an average of 8 hours 2 minutes and 100% of the last 30 days.  His mean pressure is 7.7 cm his 90% pressure is 9.3 cmH2O and his residual AHI is 0.9/h this is an excellent resolution of underlying apnea.  He says he cannot really sleep well without his CPAP he is dependent on it.  Fatigue severity scale is still high 61/ 32 points , and  the Epworth Sleepiness Scale is still elevated at 16/ 24. .     [name] is a 65 year old male with a history of obstructive sleep apnea on CPAP.  He states that he has had a lot change in medical history, is experiencing exhaustion and fatigue all the time. He finds that he has to take a nap every day. He  had Covid in August but was minor. He has concerns of dry mouth/waking up frequently during the night. biotene has helped some.  [name] reports that before he contracted Covid he already felt an increasing amount of fatigue and exhaustion but it has worsened with the infection.  At this point the patient's Epworth Sleepiness Scale was endorsed at a high level= 18 out of 24 possible points and a narcoleptic level. The fatigue also affects him at 63 out of 63 possible points.  He reports frontal headaches, fragmented sleep, he is out of job ( at [employer] after 23 years) and can't even do activities around the house. Had SVT and Ablation- 2017- now  Atrial fib relapses, was not on anticoagulation and started.  Started metoprolol. Heart monitor found persistent a fib. He has a sore , dry throat and is always achy. Started fleccanide. [name] did CMET,TSH and Vit D after she found him Covid positive.  Those numbers looked (good reportedly).  I reviewed the CPAP data: CPAP data shows 100% compliance with an average use at time of 9 hours and 19 minutes, 100% of these days the CPAP was used over 4 hours.  The CPAP 90 percentile pressure is 8.9 cmH2O  and his residual AHI is only 1.5/h which shows of good resolution the minimum setting is 5 7 cmH2O the maximum setting is 12 cmH2O and the expiratory pressure relief is 3 cmH2O.  This seems to control the apnea very well.       07-28-2018 He returns today for follow-up.  His download indicates that he uses machine 29 out of 30 days for compliance of 96%.  He uses machine other than 4 hours each night.  On average he uses his machine 8 hours and 9 minutes.  His residual AHI is 2.4.  He is currently on AutoSet with pressure 7 to 12 cm of water.  He does feel his mask leaking.  He has not changed out his supplies in over 6 months.  He returns today for evaluation.  His Epworth sleepiness score is 18, fatigue severity score is 63.    HISTORY [name] reports  that he lost his father in December 22, multiple health complications and finally lost his job, his wife also had to undergo several surgeries.  2018 was not an easy year for him.  He had seen [name] for a nasal septoplasty surgery it does seem to have helped with nasal patency now but he had some hiccups on the way including epistaxis, and it took a long time to see the mucous lining finally swelled down.  He is now able to use CPAP with good compliance again, his compliance is 90% plus average use of time is 7 hours and 12 minutes, he has used  Auto CPAP with a mean pressure of 8.4 cmH2O , the average AHI residual is 1.9/hr. , no changes in settings have to be done.  Minimum pressure is 7, maximum pressure is 12 cmH2O with 3 cm EPR ramp starts at 4 cmH2O.  He has seen his cardiologist, [name], who has treated him for SVTs by Ablation.  His tachycardia subsided for about 4 months but then after the ablation did come back.  [name] suspect that there is another causative problem.  May be acid reflux, may be apnea -however his apnea should not be a risk factor at this successful resolution. Underwent  upper Gi/ gastroscopy with no abnormalities. He now presents for SOB, coughing and palpitation- he likes to use his CPAP as it helps to prevent tachycardia and palpitations. Chest X ray was taken on 1-7 and he stated he has not been given results. There is an Epic note, small lung volume old scarring. described productive cough -?  Mucopurulent bronchitis ?[name] On doxicyline and mucinex.      REVIEW OF SYSTEMS: Out of a complete 14 system review of symptoms, the patient complains only of the following symptoms, and all other reviewed systems are negative.  See aboce, How likely are you to doze in the following situations: 0 = not likely, 1 = slight chance, 2 = moderate chance, 3 = high chance  Sitting and Reading? Watching Television? Sitting inactive in a public place (theater or  meeting)? Lying down in the afternoon when circumstances permit? Sitting and talking to someone? Sitting quietly after lunch without alcohol? In a car, while stopped for a few minutes in traffic? As a passenger in a car for an hour without a break?  Total = 15   FSS at 63  GDS 6/ 15    ALLERGIES: Allergies  Allergen Reactions   Tree Extract    Pork-Derived Products Swelling   Statins  Muscle aches    HOME MEDICATIONS: Outpatient Medications Prior to Visit  Medication Sig Dispense Refill   acetaminophen (TYLENOL) 325 MG tablet Take 2 tablets (650 mg total) by mouth every 6 (six) hours.     albuterol (PROAIR HFA) 108 (90 Base) MCG/ACT inhaler Inhale 1-2 puffs into the lungs every 6 (six) hours as needed for wheezing or shortness of breath. 8 g 5   azelastine (ASTELIN) 0.1 % nasal spray 1-2 sprays per nostril 2 times daily as needed. 30 mL 5   cyclobenzaprine (FLEXERIL) 10 MG tablet Take 1 tablet (10 mg total) by mouth 3 (three) times daily as needed. 45 tablet 0   diclofenac (VOLTAREN) 75 MG EC tablet Take 1 tablet (75 mg total) by mouth 2 (two) times daily. 90 tablet 2   doxycycline (VIBRA-TABS) 100 MG tablet Take 1 tablet (100 mg total) by mouth 2 (two) times daily. Can give caps or generic. 20 tablet 0   ezetimibe (ZETIA) 10 MG tablet Take 1 tablet (10 mg total) by mouth daily. 30 tablet 11   famotidine-calcium carbonate-magnesium hydroxide (PEPCID COMPLETE) 10-800-165 MG chewable tablet Chew by mouth.     fenofibrate (TRICOR) 145 MG tablet Take 1 tablet by mouth daily.     flecainide (TAMBOCOR) 150 MG tablet Take 1 tablet (150 mg total) by mouth 2 (two) times daily. 180 tablet 3   fluticasone (FLONASE) 50 MCG/ACT nasal spray Place 1 spray into both nostrils daily. 16 g 5   gabapentin (NEURONTIN) 100 MG capsule Take 1 capsule (100 mg total) by mouth 3 (three) times daily. 90 capsule 0   HYDROcodone-acetaminophen (NORCO) 5-325 MG tablet Take 1 tablet by mouth daily as needed.  20 tablet 0   ibuprofen (ADVIL) 200 MG tablet Take 3 tablets (600 mg total) by mouth every 6 (six) hours.     levocetirizine (XYZAL) 5 MG tablet TAKE 1 TABLET(5 MG) BY MOUTH EVERY EVENING 30 tablet 11   Magnesium 250 MG TABS Take 1 tablet by mouth once a week.     meclizine (ANTIVERT) 25 MG tablet Take 1 tablet (25 mg total) by mouth 3 (three) times daily as needed for dizziness. 15 tablet 0   meloxicam (MOBIC) 15 MG tablet Take 1 tablet (15 mg total) by mouth daily. 30 tablet 1   metFORMIN (GLUCOPHAGE) 500 MG tablet Take 1 tablet (500 mg total) by mouth 2 (two) times daily with a meal. 60 tablet 3   metoprolol tartrate (LOPRESSOR) 25 MG tablet Take one tablet by mouth twice a day.  May take one additional tablet by mouth as needed for breakthrough palpitations. 270 tablet 3   montelukast (SINGULAIR) 10 MG tablet TAKE 1 TABLET BY MOUTH 1 TIME AT NIGHT FOR COUGHING OR WHEEZING 30 tablet 5   Olopatadine HCl 0.2 % SOLN Apply 1 drop to eye daily as needed (allergies). 7.5 mL 3   pantoprazole (PROTONIX) 40 MG tablet Take 1 tablet (40 mg total) by mouth 2 (two) times daily. 60 tablet 0   PRESCRIPTION MEDICATION Inhale into the lungs at bedtime. CPAP     rivaroxaban (XARELTO) 20 MG TABS tablet Take 1 tablet (20 mg total) by mouth daily with supper. 90 tablet 3   sildenafil (REVATIO) 20 MG tablet Take 1-2 tablets (20-40 mg total) by mouth as needed. 60 tablet 1   Vitamin D, Ergocalciferol, (DRISDOL) 1.25 MG (50000 UT) CAPS capsule Take 1 capsule (50,000 Units total) by mouth every 7 (seven) days. 4 capsule 0   No  facility-administered medications prior to visit.    PAST MEDICAL HISTORY: Past Medical History:  Diagnosis Date   DDD (degenerative disc disease), lumbar    L3/L4   Diverticulosis    GERD (gastroesophageal reflux disease)    Hyperlipidemia    Low sperm motility    Pre-diabetes    Sleep apnea    uses cpap    SVT (supraventricular tachycardia) (HCC)     PAST SURGICAL HISTORY: Past  Surgical History:  Procedure Laterality Date   AMPUTATION Left 07/15/2018   Procedure: . REPAIR OF LEFT INDEX AND LONG FINGER BY FUSION /  REPAIR OF RING FINGER;  Surgeon: Mack Hook, MD;  Location: [facility] OR;  Service: Orthopedics;  Laterality: Left;   BACK SURGERY     CARDIAC CATHETERIZATION N/A 12/22/2015   Procedure: Left Heart Cath and Coronary Angiography;  Surgeon: Peter M [place], MD;  Location: [facility] INVASIVE CV LAB;  Service: Cardiovascular;  Laterality: N/A;   CARDIAC ELECTROPHYSIOLOGY STUDY AND ABLATION     ELECTROPHYSIOLOGIC STUDY N/A 02/19/2016   Procedure: SVT Ablation;  Surgeon: Marinus Maw, MD;  Location: [facility] INVASIVE CV LAB;  Service: Cardiovascular;  Laterality: N/A;   septal reconstruction  2017   TURBINATE REDUCTION Bilateral 07/2016   WISDOM TOOTH EXTRACTION      FAMILY HISTORY: Family History  Problem Relation Age of Onset   Hypertension Father    Food Allergy Son    Colon cancer Neg Hx    Stomach cancer Neg Hx    Allergic rhinitis Neg Hx    Angioedema Neg Hx    Asthma Neg Hx    Eczema Neg Hx    Immunodeficiency Neg Hx    Urticaria Neg Hx     SOCIAL HISTORY: Social History   Socioeconomic History   Marital status: Married    Spouse name: Not on file   Number of children: Not on file   Years of education: Not on file   Highest education level: Not on file  Occupational History   Not on file  Tobacco Use   Smoking status: Former    Packs/day: 1.00    Years: 10.00    Pack years: 10.00    Types: Cigarettes    Quit date: 1992    Years since quitting: 30.5   Smokeless tobacco: Never   Tobacco comments:    Uses Hook[employer] Pipe occasionally  Vaping Use   Vaping Use: Never used  Substance and Sexual Activity   Alcohol use: No   Drug use: No   Sexual activity: Yes  Other Topics Concern   Not on file  Social History Narrative   Marital Status: Married Engineer, drilling)   Children:  4 (3 Sons/1 Daughter)   Pets:  None    Living Situation: Lives with spouse  and 4 children   Origin:  He was born in [place].   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     Alcohol Use:  None   Drug Use:  None   Diet:  Regular   Exercise:  None   Hobbies:  Cards   Social Determinants of Health   Financial Resource Strain: Not on file  Food Insecurity: Not on file  Transportation Needs: Not on file  Physical Activity: Not on file  Stress: Not on file  Social Connections: Not on file  Intimate Partner Violence: Not on file      PHYSICAL  EXAM  Vitals:   02/19/21 1448  BP: 136/83  Pulse: 70  Weight: 255 lb (115.7 kg)  Height: 5' 9.5" (1.765 m)   Body mass index is 37.12 kg/m.  Generalized: Well developed, in no acute distress   Regular rate , NSR- no murmur.   Neurological examination  Mentation: Alert oriented to time, place, history taking. Follows all commands speech and language fluent Cranial nerve :  Lost smell and taste for abut 7 days, now has not reached baseline level.  Extraocular movements were full, visual field were full on confrontational test. Facial sensation and strength were normal. Uvula and tongue in midline.  Neck ROM and shoulder shrug  were normal and symmetric. Motor: The motor testing reveals 5 /5 strength of all 4 extremities.  Symmetric motor tone is noted throughout. Neck circumference 18.5 inches, Mallampati 4+ .  Sensory: Sensory testing is intact to soft touch on all 4 extremities.  No evidence of extinction is noted. Coordination:  good finger-nose- bilaterally.  Gait and station: Gait is intact. He has left knee pain.      DIAGNOSTIC DATA (LABS, IMAGING, TESTING) - I reviewed patient records, labs, notes, testing and imaging myself where available.  Lab Results  Component Value Date   WBC 7.2 01/23/2021   HGB 15.2 01/23/2021   HCT 45.2 01/23/2021   MCV 84.3 01/23/2021    PLT 192.0 01/23/2021      Component Value Date/Time   NA 138 01/23/2021 1030   K 4.2 01/23/2021 1030   CL 103 01/23/2021 1030   CO2 25 01/23/2021 1030   GLUCOSE 102 (H) 01/23/2021 1030   BUN 18 01/23/2021 1030   CREATININE 1.07 01/23/2021 1030   CREATININE 1.18 02/12/2016 1508   CALCIUM 8.9 01/23/2021 1030   PROT 6.4 01/23/2021 1030   ALBUMIN 4.0 01/23/2021 1030   AST 14 01/23/2021 1030   ALT 15 01/23/2021 1030   ALKPHOS 58 01/23/2021 1030   BILITOT 0.5 01/23/2021 1030   GFRNONAA 58 (L) 01/21/2021 1335   GFRNONAA >89 03/04/2014 1456   GFRAA >60 07/15/2018 1324   GFRAA >89 03/04/2014 1456   Lab Results  Component Value Date   CHOL 211 (H) 08/22/2018   HDL 36.50 (L) 08/22/2018   LDLCALC 162 (H) 03/04/2014   LDLDIRECT 142.0 08/22/2018   TRIG 266.0 (H) 08/22/2018   CHOLHDL 6 08/22/2018   Lab Results  Component Value Date   HGBA1C 5.8 06/30/2018   Lab Results  Component Value Date   VITAMINB12 304 06/28/2018   Lab Results  Component Value Date   TSH 1.23 06/28/2018      ASSESSMENT AND PLAN  1.  Obstructive sleep apnea on CPAP, well controlled. AHI of 0.9/his a very good resolution. Hgh compliance.    2.  Extreme fatigue and hypersomnia since beginning of the year 2021  atrial fibrillation - can be causing fatigue. He is still EDS and fatigued. His pulse was regular today- he is status post ablation. Has had less afib symptoms and less  SVT.  3.  Post -Covid anosmia and ageusia- myalgia and fatigue. Post viral symptoms have persisted - have partially improved. 4. Has had a snake bite in April 2022, and noted a change in joint pain after that (?).    The patient CPAP download shows excellent compliance and good treatment of his apnea.  He is encouraged to continue using the CPAP nightly and greater than 4 hours each night.  He is advised to consider cardiac ablation/  He will follow-up in 2-4 month  or sooner with his new CPAP , I will ask for heated hose.  I  will send a compliance reports to [facility]- adapt- he needs supplies.    He needs HST and based on that a new CPAP now- has a recently replaced  heated hose, new mask liners, head gear ,  CPAP  filter, new watertank.   He continues to use his own pulse-oximeter some nights.   I spent 20 minutes with the patient. 50% of this time was spent reviewing his CPAP download   Melvyn Novas, MD  02/19/2021, 3:12 PM [facility] [address] [place], [place] [address] [phone]

## 2021-02-19 NOTE — Patient Instructions (Signed)
Atrial Fibrillation  Atrial fibrillation is a type of irregular or rapid heartbeat (arrhythmia). In atrial fibrillation, the top part of the heart (atria) beats in an irregular pattern. This makes the heart unable to pump bloodnormally and effectively. The goal of treatment is to prevent blood clots from forming, control your heart rate, or restore your heartbeat to a normal rhythm. If this condition is not treated, it can cause serious problems, such as a weakened heart muscle (cardiomyopathy) or a stroke. What are the causes? This condition is often caused by medical conditions that damage the heart's electrical system. These include: High blood pressure (hypertension). This is the most common cause. Certain heart problems or conditions, such as heart failure, coronary artery disease, heart valve problems, or heart surgery. Diabetes. Overactive thyroid (hyperthyroidism). Obesity. Chronic kidney disease. In some cases, the cause of this condition is not known. What increases the risk? This condition is more likely to develop in: Older people. People who smoke. Athletes who do endurance exercise. People who have a family history of atrial fibrillation. Men. People who use drugs. People who drink a lot of alcohol. People who have lung conditions, such as emphysema, pneumonia, or COPD. People who have obstructive sleep apnea. What are the signs or symptoms? Symptoms of this condition include: A feeling that your heart is racing or beating irregularly. Discomfort or pain in your chest. Shortness of breath. Sudden light-headedness or weakness. Tiring easily during exercise or activity. Fatigue. Syncope (fainting). Sweating. In some cases, there are no symptoms. How is this diagnosed? Your health care provider may detect atrial fibrillation when taking your pulse. If detected, this condition may be diagnosed with: An electrocardiogram (ECG) to check electrical signals of the  heart. An ambulatory cardiac monitor to record your heart's activity for a few days. A transthoracic echocardiogram (TTE) to create pictures of your heart. A transesophageal echocardiogram (TEE) to create even closer pictures of your heart. A stress test to check your blood supply while you exercise. Imaging tests, such as a CT scan or chest X-ray. Blood tests. How is this treated? Treatment depends on underlying conditions and how you feel when you experience atrial fibrillation. This condition may be treated with: Medicines to prevent blood clots or to treat heart rate or heart rhythm problems. Electrical cardioversion to reset the heart's rhythm. A pacemaker to correct abnormal heart rhythm. Ablation to remove the heart tissue that sends abnormal signals. Left atrial appendage closure to seal the area where blood clots can form. In some cases, underlying conditions will be treated. Follow these instructions at home: Medicines Take over-the counter and prescription medicines only as told by your health care provider. Do not take any new medicines without talking to your health care provider. If you are taking blood thinners: Talk with your health care provider before you take any medicines that contain aspirin or NSAIDs, such as ibuprofen. These medicines increase your risk for dangerous bleeding. Take your medicine exactly as told, at the same time every day. Avoid activities that could cause injury or bruising, and follow instructions about how to prevent falls. Wear a medical alert bracelet or carry a card that lists what medicines you take. Lifestyle     Do not use any products that contain nicotine or tobacco, such as cigarettes, e-cigarettes, and chewing tobacco. If you need help quitting, ask your health care provider. Eat heart-healthy foods. Talk with a dietitian to make an eating plan that is right for you. Exercise regularly as told by   your health care provider. Do not  drink alcohol. Lose weight if you are overweight. Do not use drugs, including cannabis. General instructions If you have obstructive sleep apnea, manage your condition as told by your health care provider. Do not use diet pills unless your health care provider approves. Diet pills can make heart problems worse. Keep all follow-up visits as told by your health care provider. This is important. Contact a health care provider if you: Notice a change in the rate, rhythm, or strength of your heartbeat. Are taking a blood thinner and you notice more bruising. Tire more easily when you exercise or do heavy work. Have a sudden change in weight. Get help right away if you have:  Chest pain, abdominal pain, sweating, or weakness. Trouble breathing. Side effects of blood thinners, such as blood in your vomit, stool, or urine, or bleeding that cannot stop. Any symptoms of a stroke. "BE FAST" is an easy way to remember the main warning signs of a stroke: B - Balance. Signs are dizziness, sudden trouble walking, or loss of balance. E - Eyes. Signs are trouble seeing or a sudden change in vision. F - Face. Signs are sudden weakness or numbness of the face, or the face or eyelid drooping on one side. A - Arms. Signs are weakness or numbness in an arm. This happens suddenly and usually on one side of the body. S - Speech. Signs are sudden trouble speaking, slurred speech, or trouble understanding what people say. T - Time. Time to call emergency services. Write down what time symptoms started. Other signs of a stroke, such as: A sudden, severe headache with no known cause. Nausea or vomiting. Seizure. These symptoms may represent a serious problem that is an emergency. Do not wait to see if the symptoms will go away. Get medical help right away. Call your local emergency services (911 in the [place] Do not drive yourself to the hospital. Summary Atrial fibrillation is a type of irregular or rapid  heartbeat (arrhythmia). Symptoms include a feeling that your heart is beating fast or irregularly. You may be given medicines to prevent blood clots or to treat heart rate or heart rhythm problems. Get help right away if you have signs or symptoms of a stroke. Get help right away if you cannot catch your breath or have chest pain or pressure. This information is not intended to replace advice given to you by your health care provider. Make sure you discuss any questions you have with your healthcare provider. Document Revised: 01/31/2019 Document Reviewed: 01/31/2019 Elsevier Patient Education  2022 [employer] Inc.  

## 2021-02-24 ENCOUNTER — Telehealth: Payer: Self-pay

## 2021-02-24 NOTE — Telephone Encounter (Signed)
LVM for pt to call me back to schedule sleep study  

## 2021-02-24 NOTE — Telephone Encounter (Signed)
Returned pts call. No answer, LVM.

## 2021-03-02 ENCOUNTER — Other Ambulatory Visit: Payer: Self-pay

## 2021-03-02 ENCOUNTER — Ambulatory Visit: Payer: Medicare Other | Attending: Family Medicine | Admitting: Rehabilitative and Restorative Service Providers"

## 2021-03-02 ENCOUNTER — Encounter: Payer: Self-pay | Admitting: Rehabilitative and Restorative Service Providers"

## 2021-03-02 DIAGNOSIS — R262 Difficulty in walking, not elsewhere classified: Secondary | ICD-10-CM | POA: Insufficient documentation

## 2021-03-02 DIAGNOSIS — M545 Low back pain, unspecified: Secondary | ICD-10-CM | POA: Diagnosis present

## 2021-03-02 DIAGNOSIS — G8929 Other chronic pain: Secondary | ICD-10-CM | POA: Insufficient documentation

## 2021-03-02 DIAGNOSIS — M79641 Pain in right hand: Secondary | ICD-10-CM | POA: Insufficient documentation

## 2021-03-02 DIAGNOSIS — M25641 Stiffness of right hand, not elsewhere classified: Secondary | ICD-10-CM | POA: Diagnosis present

## 2021-03-02 DIAGNOSIS — M25562 Pain in left knee: Secondary | ICD-10-CM | POA: Insufficient documentation

## 2021-03-02 DIAGNOSIS — M6281 Muscle weakness (generalized): Secondary | ICD-10-CM | POA: Insufficient documentation

## 2021-03-02 DIAGNOSIS — M25561 Pain in right knee: Secondary | ICD-10-CM | POA: Diagnosis present

## 2021-03-02 NOTE — Therapy (Signed)
[facility] [address]  [place], [place], [address] Phone: [phone]   Fax:  [phone]  Physical Therapy Treatment  Patient Details  Name: Richard Walker MRN: [id number] Date of Birth: Dec 09, 1955 Referring Provider (PT): Clearance Coots, MD   Encounter Date: 03/02/2021   PT End of Session - 03/02/21 1200     Visit Number 22    Number of Visits 32    Date for PT Re-Evaluation 03/30/21    Authorization Type Medicare & Traditional Medicaid    PT Start Time 4360    PT Stop Time 1225    PT Time Calculation (min) 40 min    Activity Tolerance Patient tolerated treatment well;Patient limited by pain    Behavior During Therapy [facility] for tasks assessed/performed             Past Medical History:  Diagnosis Date   DDD (degenerative disc disease), lumbar    L3/L4   Diverticulosis    GERD (gastroesophageal reflux disease)    Hyperlipidemia    Low sperm motility    Pre-diabetes    Sleep apnea    uses cpap    SVT (supraventricular tachycardia) ([place])     Past Surgical History:  Procedure Laterality Date   AMPUTATION Left 07/15/2018   Procedure: . REPAIR OF LEFT INDEX AND LONG FINGER BY FUSION /  REPAIR OF RING FINGER;  Surgeon: Milly Jakob, MD;  Location: [place];  Service: Orthopedics;  Laterality: Left;   BACK SURGERY     CARDIAC CATHETERIZATION N/A 12/22/2015   Procedure: Left Heart Cath and Coronary Angiography;  Surgeon: Peter M Martinique, MD;  Location: [place] [facility];  Service: Cardiovascular;  Laterality: N/A;   CARDIAC ELECTROPHYSIOLOGY STUDY AND ABLATION     ELECTROPHYSIOLOGIC STUDY N/A 02/19/2016   Procedure: SVT Ablation;  Surgeon: Evans Lance, MD;  Location: [place] [facility];  Service: Cardiovascular;  Laterality: N/A;   septal reconstruction  2017   TURBINATE REDUCTION Bilateral 07/2016   WISDOM TOOTH EXTRACTION      There were no vitals filed for this visit.   Subjective Assessment -  03/02/21 1159     Subjective Pt reports that he is doing okay.  He denies any dizziness.    Patient Stated Goals decrease pain and dizziness    Currently in Pain? Yes    Pain Score 5     Pain Location Knee    Pain Orientation Right;Left    Pain Descriptors / Indicators Aching                               [facility] Adult PT Treatment/Exercise - 03/02/21 0001       Lumbar Exercises: Aerobic   Nustep L5 x 6 min (UEs/LEs)      Lumbar Exercises: Supine   Bridge 10 reps;5 seconds    Bridge Limitations arms crossed    Bridge with [employer] 10 reps;3 seconds      Knee/Hip Exercises: Machines for Strengthening   Cybex Knee Extension 20# 2x10 reps B LE    Cybex Knee Flexion 25# 2x10 reps      Knee/Hip Exercises: Standing   Hip ADduction Strengthening;Both;2 sets;10 reps    Hip ADduction Limitations 2# weight back against counter    Hip Abduction Stengthening;Both;10 reps;Knee straight    Abduction Limitations 2# weight back against counter    Forward Step Up Both;1  set;10 reps;Hand Hold: 2;Step Height: 6"    Wall Squat 20 reps;3 seconds      Knee/Hip Exercises: Supine   Straight Leg Raises Strengthening;Both;2 sets;10 reps    Straight Leg Raises Limitations 3#                      PT Short Term Goals - 02/16/21 1501       PT SHORT TERM GOAL #1   Title Patient to be independent with initial HEP.    Time 3    Period Weeks    Status Achieved    Target Date 11/26/20               PT Long Term Goals - 02/16/21 1501       PT LONG TERM GOAL #1   Title Patient to be independent with advanced HEP.    Time 6    Period Weeks    Status Partially Met   met for current   Target Date 03/30/21      PT LONG TERM GOAL #2   Title Patient to demonstrate L UE strength >/=4+/5.    Time 6    Period Weeks    Status Partially Met   still limited in B shoulder flexion, B wrist, B grip   Target Date 03/30/21      PT LONG TERM GOAL #3   Title  Patient to demonstrate B LE strength >/=4+/5.    Time 6    Period Weeks    Status Partially Met   improved in hips, still limited in B knees   Target Date 03/30/21      PT LONG TERM GOAL #4   Title Patient to demonstrate cervical AROM WFL and without pain limiting.    Time 8    Period Weeks    Status Achieved   improved in flexion, extension, and R sidebending     PT LONG TERM GOAL #5   Title Patient to report tolerance for kneeling to pray d/t improvement in pain levels.    Time 6    Period Weeks    Status On-going   unable d/t B knee pain   Target Date 03/30/21      PT LONG TERM GOAL #6   Title Patient to report tolerance for 2 hours on his feet without pain limiting.    Time 6    Period Weeks    Status On-going   reports 15-20 min   Target Date 03/30/21      PT LONG TERM GOAL #7   Title Patient to report 90% improvement in dizziness.    Time 8    Period Weeks    Status Achieved   reports 90% improvement                  Plan - 03/02/21 1234     Clinical Impression Statement [name] continues to make progress towards goal related activities.  He states that he has been performing his R finger/hand ROM and that his MD advised him that it would take approx 6 months to recover from a [place] bite.  He required less cuing for pelvic rotations with standing hip exercises today.  He continues to have bilateral knee pain, but it has improved with each week of therapy.  He had most pain today with step ups.    PT Treatment/Interventions ADLs/Self Care Home Management;Canalith Repostioning;Cryotherapy;Electrical Stimulation;Iontophoresis 28m/ml Dexamethasone;Moist Heat;Traction;Balance training;Therapeutic exercise;Therapeutic activities;Functional mobility training;Stair training;Gait training;Ultrasound;Neuromuscular  re-education;Patient/family education;Manual techniques;Vestibular;Vasopneumatic Device;Taping;Energy conservation;Dry needling;Passive range of motion;Scar  mobilization    PT Next Visit Plan address B wrist and hand weakness, knee strength, and ability to kneel for praying.    Consulted and Agree with Plan of Care Patient             Patient will benefit from skilled therapeutic intervention in order to improve the following deficits and impairments:  Abnormal gait, Hypomobility, Decreased activity tolerance, Decreased strength, Increased fascial restricitons, Impaired UE functional use, Pain, Difficulty walking, Decreased balance, Increased muscle spasms, Improper body mechanics, Dizziness, Decreased range of motion, Impaired flexibility, Postural dysfunction  Visit Diagnosis: Chronic pain of left knee  Chronic pain of right knee  Bilateral low back pain, unspecified chronicity, unspecified whether sciatica present  Muscle weakness (generalized)  Difficulty in walking, not elsewhere classified  Acute pain of right knee     Problem List Patient Active Problem List   Diagnosis Date Noted   OSA on CPAP 02/19/2021   Bite, snake 01/28/2021   Neck pain 11/13/2020   Vertigo 11/13/2020   Hypertension 11/03/2020   Contusion of bone 10/23/2020   Degenerative tear of left medial meniscus 10/23/2020   Concussion with no loss of consciousness 10/23/2020   Paroxysmal atrial fibrillation ([place]) 05/22/2019   Unspecified atrial flutter ([place]) 05/22/2019   Pain in right foot 03/30/2018   Perennial and seasonal allergic rhinitis 11/09/2017   History of food allergy 11/09/2017   Wheezing 11/09/2017   Dyspnea/wheezing 10/31/2017   Cough 10/31/2017   Bronchitis, mucopurulent recurrent ([place]) 09/07/2017   SVT (supraventricular tachycardia) ([place]) 04/23/2015   Abdominal pain, epigastric 06/03/2013   Unspecified vitamin D deficiency 04/09/2013   Esophageal reflux 04/09/2013   Hepatitis B carrier ([place]) 04/09/2013   Other and unspecified hyperlipidemia 04/09/2013   Unspecified sleep apnea 04/09/2013    Juel Burrow, PT, DPT 03/02/2021, 12:40  PM  [facility] [address]  [place], [place], [address] Phone: [phone]   Fax:  [phone]  Name: Richard Walker MRN: [id number] Date of Birth: [birth year]

## 2021-03-04 ENCOUNTER — Ambulatory Visit: Payer: Medicare Other

## 2021-03-04 ENCOUNTER — Other Ambulatory Visit: Payer: Self-pay

## 2021-03-04 DIAGNOSIS — G8929 Other chronic pain: Secondary | ICD-10-CM

## 2021-03-04 DIAGNOSIS — M6281 Muscle weakness (generalized): Secondary | ICD-10-CM

## 2021-03-04 DIAGNOSIS — M25561 Pain in right knee: Secondary | ICD-10-CM

## 2021-03-04 DIAGNOSIS — M79641 Pain in right hand: Secondary | ICD-10-CM

## 2021-03-04 DIAGNOSIS — M25641 Stiffness of right hand, not elsewhere classified: Secondary | ICD-10-CM

## 2021-03-04 DIAGNOSIS — M25562 Pain in left knee: Secondary | ICD-10-CM | POA: Diagnosis not present

## 2021-03-04 DIAGNOSIS — M545 Low back pain, unspecified: Secondary | ICD-10-CM

## 2021-03-04 DIAGNOSIS — R262 Difficulty in walking, not elsewhere classified: Secondary | ICD-10-CM

## 2021-03-04 NOTE — Therapy (Signed)
[facility] [address]  [place], [place], [address] Phone: [phone]   Fax:  [phone]  Physical Therapy Treatment  Patient Details  Name: Richard Walker MRN: [id number] Date of Birth: November 17, 1955 Referring Provider (PT): Clearance Coots, MD   Encounter Date: 03/04/2021   PT End of Session - 03/04/21 1400     Visit Number 24    Number of Visits 32    Date for PT Re-Evaluation 03/30/21    Authorization Type Medicare & Traditional Medicaid    PT Start Time 8270    PT Stop Time 7867    PT Time Calculation (min) 41 min    Activity Tolerance Patient tolerated treatment well;Patient limited by pain    Behavior During Therapy [facility] for tasks assessed/performed             Past Medical History:  Diagnosis Date   DDD (degenerative disc disease), lumbar    L3/L4   Diverticulosis    GERD (gastroesophageal reflux disease)    Hyperlipidemia    Low sperm motility    Pre-diabetes    Sleep apnea    uses cpap    SVT (supraventricular tachycardia) ([place])     Past Surgical History:  Procedure Laterality Date   AMPUTATION Left 07/15/2018   Procedure: . REPAIR OF LEFT INDEX AND LONG FINGER BY FUSION /  REPAIR OF RING FINGER;  Surgeon: Milly Jakob, MD;  Location: [place];  Service: Orthopedics;  Laterality: Left;   BACK SURGERY     CARDIAC CATHETERIZATION N/A 12/22/2015   Procedure: Left Heart Cath and Coronary Angiography;  Surgeon: Peter M Martinique, MD;  Location: [place] [facility];  Service: Cardiovascular;  Laterality: N/A;   CARDIAC ELECTROPHYSIOLOGY STUDY AND ABLATION     ELECTROPHYSIOLOGIC STUDY N/A 02/19/2016   Procedure: SVT Ablation;  Surgeon: Evans Lance, MD;  Location: [place] [facility];  Service: Cardiovascular;  Laterality: N/A;   septal reconstruction  2017   TURBINATE REDUCTION Bilateral 07/2016   WISDOM TOOTH EXTRACTION      There were no vitals filed for this visit.   Subjective Assessment -  03/04/21 1321     Subjective Pt reports continued pain in both knees, ever since weaning off of pain meds he has experienced more pain.    Pertinent History none    Diagnostic tests Per MD- "Independent review of the CT lumbar spine from 2/15 shows stable interbody fusion at L4/5 and degenerative disc disease at L5-S1.    Independent review of the CT cervical spine 2/15 shows severe degenerative change at C5-6.    Independent review the B knee x-ray from 2/15 shows no acute changes."    Patient Stated Goals decrease pain and dizziness    Currently in Pain? Yes    Pain Score 5     Pain Location Knee    Pain Orientation Right;Left    Pain Descriptors / Indicators Sharp;Constant    Pain Type Acute pain                               [facility] Adult PT Treatment/Exercise - 03/04/21 0001       Lumbar Exercises: Quadruped   [place] 10 reps    Other Quadruped Lumbar Exercises LE kicks 10 reps with neutral pelvis    Other Quadruped Lumbar Exercises kneeling push ups on mat table 10reps  Knee/Hip Exercises: Aerobic   Recumbent Bike L3x58mn      Knee/Hip Exercises: Machines for Strengthening   Cybex Knee Extension 20# 10 reps    Cybex Knee Flexion 25# 10 reps    Cybex Leg Press 20# 2x10      Knee/Hip Exercises: Seated   Sit to Sand 10 reps;without UE support   blue medicine ball     Knee/Hip Exercises: Supine   Short Arc Quad Sets Strengthening;Both;20 reps    Short Arc Quad Sets Limitations with ball squeeze      Knee/Hip Exercises: Sidelying   Hip ADduction Strengthening;Both;2 sets;10 reps    Hip ADduction Limitations 2#                      PT Short Term Goals - 02/16/21 1501       PT SHORT TERM GOAL #1   Title Patient to be independent with initial HEP.    Time 3    Period Weeks    Status Achieved    Target Date 11/26/20               PT Long Term Goals - 02/16/21 1501       PT LONG TERM GOAL #1   Title Patient to be  independent with advanced HEP.    Time 6    Period Weeks    Status Partially Met   met for current   Target Date 03/30/21      PT LONG TERM GOAL #2   Title Patient to demonstrate L UE strength >/=4+/5.    Time 6    Period Weeks    Status Partially Met   still limited in B shoulder flexion, B wrist, B grip   Target Date 03/30/21      PT LONG TERM GOAL #3   Title Patient to demonstrate B LE strength >/=4+/5.    Time 6    Period Weeks    Status Partially Met   improved in hips, still limited in B knees   Target Date 03/30/21      PT LONG TERM GOAL #4   Title Patient to demonstrate cervical AROM WFL and without pain limiting.    Time 8    Period Weeks    Status Achieved   improved in flexion, extension, and R sidebending     PT LONG TERM GOAL #5   Title Patient to report tolerance for kneeling to pray d/t improvement in pain levels.    Time 6    Period Weeks    Status On-going   unable d/t B knee pain   Target Date 03/30/21      PT LONG TERM GOAL #6   Title Patient to report tolerance for 2 hours on his feet without pain limiting.    Time 6    Period Weeks    Status On-going   reports 15-20 min   Target Date 03/30/21      PT LONG TERM GOAL #7   Title Patient to report 90% improvement in dizziness.    Time 8    Period Weeks    Status Achieved   reports 90% improvement                  Plan - 03/04/21 1402     Clinical Impression Statement Pt had no complaints with the exercises today. He noted that his knees have been feeling the same since last session. He had a new c/o  discomfort along the lateral portion of his R knee with the knee extensions but tolerable. Pt had trouble with APT in quadruped and required much TC to correctly perform these. Difficulty noted with quadruped exercises d/t LE weakness and core instability. He would benefit from continued VMO targeted exercises and quadruped activities to improve stabilization.    Personal Factors and  Comorbidities Age;Comorbidity 1;Fitness;Past/Current Experience;Time since onset of injury/illness/exacerbation    Comorbidities back surgery L3-4 1997    PT Frequency 2x / week    PT Duration 6 weeks    PT Treatment/Interventions ADLs/Self Care Home Management;Canalith Repostioning;Cryotherapy;Electrical Stimulation;Iontophoresis 13m/ml Dexamethasone;Moist Heat;Traction;Balance training;Therapeutic exercise;Therapeutic activities;Functional mobility training;Stair training;Gait training;Ultrasound;Neuromuscular re-education;Patient/family education;Manual techniques;Vestibular;Vasopneumatic Device;Taping;Energy conservation;Dry needling;Passive range of motion;Scar mobilization    PT Next Visit Plan address B wrist and hand weakness, knee strength, and ability to kneel for praying.    Consulted and Agree with Plan of Care Patient             Patient will benefit from skilled therapeutic intervention in order to improve the following deficits and impairments:  Abnormal gait, Hypomobility, Decreased activity tolerance, Decreased strength, Increased fascial restricitons, Impaired UE functional use, Pain, Difficulty walking, Decreased balance, Increased muscle spasms, Improper body mechanics, Dizziness, Decreased range of motion, Impaired flexibility, Postural dysfunction  Visit Diagnosis: Chronic pain of left knee  Chronic pain of right knee  Bilateral low back pain, unspecified chronicity, unspecified whether sciatica present  Muscle weakness (generalized)  Difficulty in walking, not elsewhere classified  Acute pain of right knee  Pain in right hand  Stiffness of right hand, not elsewhere classified     Problem List Patient Active Problem List   Diagnosis Date Noted   OSA on CPAP 02/19/2021   Bite, snake 01/28/2021   Neck pain 11/13/2020   Vertigo 11/13/2020   Hypertension 11/03/2020   Contusion of bone 10/23/2020   Degenerative tear of left medial meniscus 10/23/2020    Concussion with no loss of consciousness 10/23/2020   Paroxysmal atrial fibrillation ([place] 05/22/2019   Uns[place]ified atrial flutter ([place] 05/22/2019   Pain in right foot 03/30/2018   Perennial and seasonal allergic rhinitis 11/09/2017   History of food allergy 11/09/2017   Wheezing 11/09/2017   Dyspnea/wheezing 10/31/2017   Cough 10/31/2017   Bronchitis, mucopurulent recurrent ([place] 09/07/2017   SV[place] ([place]ricular tachycardia) ([place] 04/23/2015   Abdominal pain, epigastric 06/03/2013   Unspecified vitamin D deficiency 04/09/2013   Esophageal reflux 04/09/2013   Hepatitis B carrier ([place] 04/09/2013   Other and unspecified hyperlipidemia 04/09/2013   Unspecified sleep apnea 04/09/2013    BArtist Pais PTA 03/04/2021, 3:08 PM  [facility] [address] [place] [address]Phone: [phone]  Fax:  [phone] Name: Richard MOUNSEYMRN: [id number] of Birth: 1March 23, 1957

## 2021-03-09 ENCOUNTER — Other Ambulatory Visit: Payer: Self-pay

## 2021-03-09 ENCOUNTER — Ambulatory Visit: Payer: Medicare Other

## 2021-03-09 DIAGNOSIS — M25562 Pain in left knee: Secondary | ICD-10-CM | POA: Diagnosis not present

## 2021-03-09 DIAGNOSIS — R262 Difficulty in walking, not elsewhere classified: Secondary | ICD-10-CM

## 2021-03-09 DIAGNOSIS — M25561 Pain in right knee: Secondary | ICD-10-CM

## 2021-03-09 DIAGNOSIS — M25641 Stiffness of right hand, not elsewhere classified: Secondary | ICD-10-CM

## 2021-03-09 DIAGNOSIS — M6281 Muscle weakness (generalized): Secondary | ICD-10-CM

## 2021-03-09 DIAGNOSIS — M545 Low back pain, unspecified: Secondary | ICD-10-CM

## 2021-03-09 DIAGNOSIS — G8929 Other chronic pain: Secondary | ICD-10-CM

## 2021-03-09 DIAGNOSIS — M79641 Pain in right hand: Secondary | ICD-10-CM

## 2021-03-09 NOTE — Therapy (Signed)
[facility] [address]  [place], [place], [address] Phone: [phone]   Fax:  [phone]  Physical Therapy Treatment  Patient Details  Name: Richard Walker MRN: [id number] Date of Birth: 11-09-1955 Referring Provider (PT): Clearance Coots, MD   Encounter Date: 03/09/2021   PT End of Session - 03/09/21 1158     Visit Number 25    Number of Visits 32    Date for PT Re-Evaluation 03/30/21    Authorization Type Medicare & Traditional Medicaid    PT Start Time 1102    PT Stop Time 1143    PT Time Calculation (min) 41 min    Activity Tolerance Patient tolerated treatment well;Patient limited by pain    Behavior During Therapy [facility] for tasks assessed/performed             Past Medical History:  Diagnosis Date   DDD (degenerative disc disease), lumbar    L3/L4   Diverticulosis    GERD (gastroesophageal reflux disease)    Hyperlipidemia    Low sperm motility    Pre-diabetes    Sleep apnea    uses cpap    SVT (supraventricular tachycardia) ([place])     Past Surgical History:  Procedure Laterality Date   AMPUTATION Left 07/15/2018   Procedure: . REPAIR OF LEFT INDEX AND LONG FINGER BY FUSION /  REPAIR OF RING FINGER;  Surgeon: Milly Jakob, MD;  Location: [place];  Service: Orthopedics;  Laterality: Left;   BACK SURGERY     CARDIAC CATHETERIZATION N/A 12/22/2015   Procedure: Left Heart Cath and Coronary Angiography;  Surgeon: Peter M Martinique, MD;  Location: [facility];  Service: Cardiovascular;  Laterality: N/A;   CARDIAC ELECTROPHYSIOLOGY STUDY AND ABLATION     ELECTROPHYSIOLOGIC STUDY N/A 02/19/2016   Procedure: SVT Ablation;  Surgeon: Evans Lance, MD;  Location: [place] [facility];  Service: Cardiovascular;  Laterality: N/A;   septal reconstruction  2017   TURBINATE REDUCTION Bilateral 07/2016   WISDOM TOOTH EXTRACTION      There were no vitals filed for this visit.   Subjective Assessment -  03/09/21 1110     Subjective Knees are doing better today.    Pertinent History none    Diagnostic tests Per MD- "Independent review of the CT lumbar spine from 2/15 shows stable interbody fusion at L4/5 and degenerative disc disease at L5-S1.    Independent review of the CT cervical spine 2/15 shows severe degenerative change at C5-6.    Independent review the B knee x-ray from 2/15 shows no acute changes."    Patient Stated Goals decrease pain and dizziness    Currently in Pain? Yes    Pain Score 3     Pain Location Knee    Pain Orientation Right;Left    Pain Descriptors / Indicators Sharp;Constant    Pain Type Acute pain;Chronic pain                               [facility] Adult PT Treatment/Exercise - 03/09/21 0001       Lumbar Exercises: Quadruped   Other Quadruped Lumbar Exercises LE kicks 10 reps with neutral pelvis and red TB above knees      Knee/Hip Exercises: Stretches   Pension scheme manager reps;30 seconds    Quad Stretch Limitations prone with strap    Hip Flexor Stretch Right;2 reps;30 seconds  Hip Flexor Stretch Limitations mod thomas with strap      Knee/Hip Exercises: Aerobic   Recumbent Bike L4x35mn      Knee/Hip Exercises: Machines for Strengthening   Cybex Knee Flexion 25# 2x10      Knee/Hip Exercises: Standing   Hip Flexion Stengthening;Both;2 sets;10 reps;Knee straight    Hip Flexion Limitations 3# with TKE, UE support    Hip ADduction Strengthening;Both;10 reps    Hip ADduction Limitations 3# UE support      Knee/Hip Exercises: Seated   Long Arc Quad Strengthening;Both;2 sets;10 reps;Weights    Long Arc Quad Weight 3 lbs.    Long Arc Quad Limitations with iso hip flexion                      PT Short Term Goals - 02/16/21 1501       PT SHORT TERM GOAL #1   Title Patient to be independent with initial HEP.    Time 3    Period Weeks    Status Achieved    Target Date 11/26/20               PT Long Term  Goals - 02/16/21 1501       PT LONG TERM GOAL #1   Title Patient to be independent with advanced HEP.    Time 6    Period Weeks    Status Partially Met   met for current   Target Date 03/30/21      PT LONG TERM GOAL #2   Title Patient to demonstrate L UE strength >/=4+/5.    Time 6    Period Weeks    Status Partially Met   still limited in B shoulder flexion, B wrist, B grip   Target Date 03/30/21      PT LONG TERM GOAL #3   Title Patient to demonstrate B LE strength >/=4+/5.    Time 6    Period Weeks    Status Partially Met   improved in hips, still limited in B knees   Target Date 03/30/21      PT LONG TERM GOAL #4   Title Patient to demonstrate cervical AROM WFL and without pain limiting.    Time 8    Period Weeks    Status Achieved   improved in flexion, extension, and R sidebending     PT LONG TERM GOAL #5   Title Patient to report tolerance for kneeling to pray d/t improvement in pain levels.    Time 6    Period Weeks    Status On-going   unable d/t B knee pain   Target Date 03/30/21      PT LONG TERM GOAL #6   Title Patient to report tolerance for 2 hours on his feet without pain limiting.    Time 6    Period Weeks    Status On-going   reports 15-20 min   Target Date 03/30/21      PT LONG TERM GOAL #7   Title Patient to report 90% improvement in dizziness.    Time 8    Period Weeks    Status Achieved   reports 90% improvement                  Plan - 03/09/21 1159     Clinical Impression Statement Pt reported that his knees were feeling better coming in today. He continues to have pain along the lateral portion of his  R knee with the resisted leg extensions. This was relieved with seated LAQ with cuff weights and quad stretches. We talked about adding prone quad stretches to HEP as he noted pain relief from these, he declined pictures. He still notes more pain when standing on his R LE during hip exercises due to increased WB. Overall he notes  improvement with exercises but still demonstrates decreased tolerance for WB and resisted knee extension. Pt responded well.    Personal Factors and Comorbidities Age;Comorbidity 1;Fitness;Past/Current Experience;Time since onset of injury/illness/exacerbation    Comorbidities back surgery L3-4 1997    PT Frequency 2x / week    PT Duration 6 weeks    PT Treatment/Interventions ADLs/Self Care Home Management;Canalith Repostioning;Cryotherapy;Electrical Stimulation;Iontophoresis 12m/ml Dexamethasone;Moist Heat;Traction;Balance training;Therapeutic exercise;Therapeutic activities;Functional mobility training;Stair training;Gait training;Ultrasound;Neuromuscular re-education;Patient/family education;Manual techniques;Vestibular;Vasopneumatic Device;Taping;Energy conservation;Dry needling;Passive range of motion;Scar mobilization    PT Next Visit Plan hip flexor/quad stretchingaddress B wrist and hand weakness, knee strength, and ability to kneel for praying.    Consulted and Agree with Plan of Care Patient             Patient will benefit from skilled therapeutic intervention in order to improve the following deficits and impairments:  Abnormal gait, Hypomobility, Decreased activity tolerance, Decreased strength, Increased fascial restricitons, Impaired UE functional use, Pain, Difficulty walking, Decreased balance, Increased muscle spasms, Improper body mechanics, Dizziness, Decreased range of motion, Impaired flexibility, Postural dysfunction  Visit Diagnosis: Chronic pain of left knee  Chronic pain of right knee  Bilateral low back pain, unspecified chronicity, unspecified whether sciatica present  Muscle weakness (generalized)  Difficulty in walking, not elsewhere classified  Acute pain of right knee  Pain in right hand  Stiffness of right hand, not elsewhere classified     Problem List Patient Active Problem List   Diagnosis Date Noted   OSA on CPAP 02/19/2021   Bite, snake  01/28/2021   Neck pain 11/13/2020   Vertigo 11/13/2020   Hypertension 11/03/2020   Contusion of bone 10/23/2020   Degenerative tear of left medial meniscus 10/23/2020   Concussion with no loss of consciousness 10/23/2020   Paroxysmal atrial fibrillation ([facility] 05/22/2019   Unspecified atrial flutter ([facility] 05/22/2019   Pain in right foot 03/30/2018   Perennial and seasonal allergic rhinitis 11/09/2017   History of food allergy 11/09/2017   Wheezing 11/09/2017   Dyspnea/wheezing 10/31/2017   Cough 10/31/2017   Bronchitis, mucopurulent recurrent ([facility] 09/07/2017   SVT (supra[place]ricular tachycardia) ([place] 04/23/2015   Abdominal pain, epigastric 06/03/2013   Unspecified vitamin D deficiency 04/09/2013   Esophageal reflux 04/09/2013   Hepatitis B carrier ([facility] 04/09/2013   Other and unspecified hyperlipidemia 04/09/2013   Unspecified sleep apnea 04/09/2013    BArtist Pais PTA 03/09/2021, 12:14 PM  [facility] [place] [address]Phone: [phone]  Fax:  [phone] Name: SMONTOYA BRANDELMRN: [id number] of Birth: 103-16-1957

## 2021-03-11 ENCOUNTER — Other Ambulatory Visit: Payer: Self-pay

## 2021-03-11 ENCOUNTER — Ambulatory Visit: Payer: Medicare Other | Admitting: Physical Therapy

## 2021-03-11 ENCOUNTER — Encounter: Payer: Self-pay | Admitting: Physical Therapy

## 2021-03-11 DIAGNOSIS — M545 Low back pain, unspecified: Secondary | ICD-10-CM

## 2021-03-11 DIAGNOSIS — M25562 Pain in left knee: Secondary | ICD-10-CM | POA: Diagnosis not present

## 2021-03-11 DIAGNOSIS — R262 Difficulty in walking, not elsewhere classified: Secondary | ICD-10-CM

## 2021-03-11 DIAGNOSIS — G8929 Other chronic pain: Secondary | ICD-10-CM

## 2021-03-11 DIAGNOSIS — M6281 Muscle weakness (generalized): Secondary | ICD-10-CM

## 2021-03-11 DIAGNOSIS — M25641 Stiffness of right hand, not elsewhere classified: Secondary | ICD-10-CM

## 2021-03-11 DIAGNOSIS — M25561 Pain in right knee: Secondary | ICD-10-CM

## 2021-03-11 DIAGNOSIS — M79641 Pain in right hand: Secondary | ICD-10-CM

## 2021-03-11 NOTE — Therapy (Signed)
[facility] [address]  [place], [place], [address] Phone: [phone]   Fax:  [phone]  Physical Therapy Treatment  Patient Details  Name: Richard Walker MRN: [id number] Date of Birth: [birth year] Referring Provider (PT): Clearance Coots, MD   Encounter Date: 03/11/2021   PT End of Session - 03/11/21 1345     Visit Number 26    Number of Visits 32    Date for PT Re-Evaluation 03/30/21    Authorization Type Medicare & Traditional Medicaid    PT Start Time 1304    PT Stop Time 1344    PT Time Calculation (min) 40 min    Activity Tolerance Patient tolerated treatment well;Patient limited by pain    Behavior During Therapy [facility] for tasks assessed/performed             Past Medical History:  Diagnosis Date   DDD (degenerative disc disease), lumbar    L3/L4   Diverticulosis    GERD (gastroesophageal reflux disease)    Hyperlipidemia    Low sperm motility    Pre-diabetes    Sleep apnea    uses cpap    SVT (supraventricular tachycardia) ([place])     Past Surgical History:  Procedure Laterality Date   AMPUTATION Left 07/15/2018   Procedure: . REPAIR OF LEFT INDEX AND LONG FINGER BY FUSION /  REPAIR OF RING FINGER;  Surgeon: Milly Jakob, MD;  Location: [place];  Service: Orthopedics;  Laterality: Left;   BACK SURGERY     CARDIAC CATHETERIZATION N/A 12/22/2015   Procedure: Left Heart Cath and Coronary Angiography;  Surgeon: Peter M Martinique, MD;  Location: [place] [facility];  Service: Cardiovascular;  Laterality: N/A;   CARDIAC ELECTROPHYSIOLOGY STUDY AND ABLATION     ELECTROPHYSIOLOGIC STUDY N/A 02/19/2016   Procedure: SVT Ablation;  Surgeon: Evans Lance, MD;  Location: [place] [facility];  Service: Cardiovascular;  Laterality: N/A;   septal reconstruction  2017   TURBINATE REDUCTION Bilateral 07/2016   WISDOM TOOTH EXTRACTION      There were no vitals filed for this visit.   Subjective Assessment -  03/11/21 1305     Subjective Pain has been going down. Notes that he had some L knee pain with some exercises last session, but was slightly improved by the end of the session. Still has some pain going up stairs. Hand has been doing better- has been doing his exercises.    Pertinent History none    Diagnostic tests Per MD- "Independent review of the CT lumbar spine from 2/15 shows stable interbody fusion at L4/5 and degenerative disc disease at L5-S1.    Independent review of the CT cervical spine 2/15 shows severe degenerative change at C5-6.    Independent review the B knee x-ray from 2/15 shows no acute changes."    Patient Stated Goals decrease pain and dizziness    Currently in Pain? Yes    Pain Score 3     Pain Location Knee    Pain Orientation Right;Left    Pain Descriptors / Indicators Constant;Sharp    Pain Type Acute pain;Chronic pain                               [facility] Adult PT Treatment/Exercise - 03/11/21 0001       Therapeutic Activites    Therapeutic Activities Other Therapeutic Activities    Other Therapeutic  Activities child's pose as simulation for praying; discussion on adjustment of praying positioning and improvement in floor transfers for allow for this activity      Lumbar Exercises: Aerobic   Nustep L5 x 6 min (UEs/LEs)      Lumbar Exercises: Standing   Other Standing Lumbar Exercises sidestepping with red TB around ankles 4x27f      Knee/Hip Exercises: Stretches   Hip Flexor Stretch Right;2 reps;30 seconds    Hip Flexor Stretch Limitations mod thomas with strap   cues to maintain reletive hip extension   ITB Stretch Right;2 reps;30 seconds    ITB Stretch Limitations supine with strap      Knee/Hip Exercises: Seated   Sit to Sand 10 reps;without UE support;2 sets   red TB, sitting on airex     Knee/Hip Exercises: Supine   Bridges Strengthening;Both;1 set;10 reps    Bridges Limitations bridge + march   slight R hip instability                    PT Education - 03/11/21 1345     Education Details update to HEP; edu on patellar tendon and TFL anatomy    Person(s) Educated Patient    Methods Explanation;Demonstration;Tactile cues;Verbal cues;Handout    Comprehension Verbalized understanding;Returned demonstration              PT Short Term Goals - 02/16/21 1501       PT SHORT TERM GOAL #1   Title Patient to be independent with initial HEP.    Time 3    Period Weeks    Status Achieved    Target Date 11/26/20               PT Long Term Goals - 02/16/21 1501       PT LONG TERM GOAL #1   Title Patient to be independent with advanced HEP.    Time 6    Period Weeks    Status Partially Met   met for current   Target Date 03/30/21      PT LONG TERM GOAL #2   Title Patient to demonstrate L UE strength >/=4+/5.    Time 6    Period Weeks    Status Partially Met   still limited in B shoulder flexion, B wrist, B grip   Target Date 03/30/21      PT LONG TERM GOAL #3   Title Patient to demonstrate B LE strength >/=4+/5.    Time 6    Period Weeks    Status Partially Met   improved in hips, still limited in B knees   Target Date 03/30/21      PT LONG TERM GOAL #4   Title Patient to demonstrate cervical AROM WFL and without pain limiting.    Time 8    Period Weeks    Status Achieved   improved in flexion, extension, and R sidebending     PT LONG TERM GOAL #5   Title Patient to report tolerance for kneeling to pray d/t improvement in pain levels.    Time 6    Period Weeks    Status On-going   unable d/t B knee pain   Target Date 03/30/21      PT LONG TERM GOAL #6   Title Patient to report tolerance for 2 hours on his feet without pain limiting.    Time 6    Period Weeks    Status On-going   reports  15-20 min   Target Date 03/30/21      PT LONG TERM GOAL #7   Title Patient to report 90% improvement in dizziness.    Time 8    Period Weeks    Status Achieved   reports 90%  improvement                  Plan - 03/11/21 1346     Clinical Impression Statement Patient arrived to session with report of improvement in pain. Notes some remaining pain with ascending stairs and but in now able to demonstrate improved R hand grip. Worked on standing LE strengthening ther-ex for improved hip stability and quad strength. Patient reported mild R anterolateral knee pain with standing ther-ex, thus proceeded with quad and TFL stretching for relief. Educated patient on knee anatomy to improve understanding of structures and patient's pain. Worked on simulation of praying on knees with child's pose which patient tolerated fairly well. Did report trouble when standing up from floor, thus encouraged use of cushion under knees and use of furniture support. Updated HEP with exercises that were well-tolerated today. Patient reported understanding and without complaints at end of session.    Personal Factors and Comorbidities Age;Comorbidity 1;Fitness;Past/Current Experience;Time since onset of injury/illness/exacerbation    Comorbidities back surgery L3-4 1997    PT Frequency 2x / week    PT Duration 6 weeks    PT Treatment/Interventions ADLs/Self Care Home Management;Canalith Repostioning;Cryotherapy;Electrical Stimulation;Iontophoresis 104m/ml Dexamethasone;Moist Heat;Traction;Balance training;Therapeutic exercise;Therapeutic activities;Functional mobility training;Stair training;Gait training;Ultrasound;Neuromuscular re-education;Patient/family education;Manual techniques;Vestibular;Vasopneumatic Device;Taping;Energy conservation;Dry needling;Passive range of motion;Scar mobilization    PT Next Visit Plan hip flexor/quad stretchingaddress B wrist and hand weakness, knee strength, and ability to kneel for praying.    Consulted and Agree with Plan of Care Patient             Patient will benefit from skilled therapeutic intervention in order to improve the following deficits  and impairments:  Abnormal gait, Hypomobility, Decreased activity tolerance, Decreased strength, Increased fascial restricitons, Impaired UE functional use, Pain, Difficulty walking, Decreased balance, Increased muscle spasms, Improper body mechanics, Dizziness, Decreased range of motion, Impaired flexibility, Postural dysfunction  Visit Diagnosis: Chronic pain of left knee  Chronic pain of right knee  Bilateral low back pain, unspecified chronicity, unspecified whether sciatica present  Muscle weakness (generalized)  Difficulty in walking, not elsewhere classified  Acute pain of right knee  Pain in right hand  Stiffness of right hand, not elsewhere classified     Problem List Patient Active Problem List   Diagnosis Date Noted   OSA on CPAP 02/19/2021   Bite, snake 01/28/2021   Neck pain 11/13/2020   Vertigo 11/13/2020   Hypertension 11/03/2020   Contusion of bone 10/23/2020   Degenerative tear of left medial meniscus 10/23/2020   Concussion with no loss of consciousness 10/23/2020   Paroxysmal atrial fibrillation ([place] 05/22/2019   Unspecified atrial flutter ([facility] 05/22/2019   Pain in right foot 03/30/2018   Perennial and seasonal allergic rhinitis 11/09/2017   History of food allergy 11/09/2017   Wheezing 11/09/2017   Dyspnea/wheezing 10/31/2017   Cough 10/31/2017   Bronchitis, mucopurulent recurrent ([facility] 09/07/2017   SVT (supraventricular tachycardia) ([place] 04/23/2015   Abdominal pain, epigastric 06/03/2013   Unspecified vitamin D deficiency 04/09/2013   Esophageal reflux 04/09/2013   Hepatitis B carrier ([place] 04/09/2013   Other and unspecified hyperlipidemia 04/09/2013   Unspecified sleep apnea 04/09/2013     YJanene Harvey PT, DPT 03/11/21 1:50 PM  [facility] [address]  [place], [place], [address] Phone: [phone]   Fax:  [phone]  Name: Richard Walker MRN:  [id number] Date of Birth: 1956-03-31

## 2021-03-16 ENCOUNTER — Encounter: Payer: Self-pay | Admitting: Physical Therapy

## 2021-03-16 ENCOUNTER — Ambulatory Visit: Payer: Medicare Other | Admitting: Physical Therapy

## 2021-03-16 ENCOUNTER — Other Ambulatory Visit: Payer: Self-pay

## 2021-03-16 DIAGNOSIS — M79641 Pain in right hand: Secondary | ICD-10-CM

## 2021-03-16 DIAGNOSIS — M545 Low back pain, unspecified: Secondary | ICD-10-CM

## 2021-03-16 DIAGNOSIS — M6281 Muscle weakness (generalized): Secondary | ICD-10-CM

## 2021-03-16 DIAGNOSIS — M25562 Pain in left knee: Secondary | ICD-10-CM

## 2021-03-16 DIAGNOSIS — R262 Difficulty in walking, not elsewhere classified: Secondary | ICD-10-CM

## 2021-03-16 DIAGNOSIS — M25641 Stiffness of right hand, not elsewhere classified: Secondary | ICD-10-CM

## 2021-03-16 DIAGNOSIS — M25561 Pain in right knee: Secondary | ICD-10-CM

## 2021-03-16 DIAGNOSIS — G8929 Other chronic pain: Secondary | ICD-10-CM

## 2021-03-16 NOTE — Therapy (Signed)
[facility] [address]  [place], [place], [address] Phone: [phone]   Fax:  [phone]  Physical Therapy Treatment  Patient Details  Name: Richard Walker MRN: [id number] Date of Birth: [birth year] Referring Provider (PT): Clearance Coots, MD   Encounter Date: 03/16/2021   PT End of Session - 03/16/21 1357     Visit Number 27    Number of Visits 32    Date for PT Re-Evaluation 03/30/21    Authorization Type Medicare & Traditional Medicaid    PT Start Time 1884    PT Stop Time 1660    PT Time Calculation (min) 45 min    Activity Tolerance Patient tolerated treatment well    Behavior During Therapy [facility] for tasks assessed/performed             Past Medical History:  Diagnosis Date   DDD (degenerative disc disease), lumbar    L3/L4   Diverticulosis    GERD (gastroesophageal reflux disease)    Hyperlipidemia    Low sperm motility    Pre-diabetes    Sleep apnea    uses cpap    SVT (supraventricular tachycardia) ([place])     Past Surgical History:  Procedure Laterality Date   AMPUTATION Left 07/15/2018   Procedure: . REPAIR OF LEFT INDEX AND LONG FINGER BY FUSION /  REPAIR OF RING FINGER;  Surgeon: Milly Jakob, MD;  Location: [place];  Service: Orthopedics;  Laterality: Left;   BACK SURGERY     CARDIAC CATHETERIZATION N/A 12/22/2015   Procedure: Left Heart Cath and Coronary Angiography;  Surgeon: Peter M Martinique, MD;  Location: [place] [facility];  Service: Cardiovascular;  Laterality: N/A;   CARDIAC ELECTROPHYSIOLOGY STUDY AND ABLATION     ELECTROPHYSIOLOGIC STUDY N/A 02/19/2016   Procedure: SVT Ablation;  Surgeon: Evans Lance, MD;  Location: [place] [facility];  Service: Cardiovascular;  Laterality: N/A;   septal reconstruction  2017   TURBINATE REDUCTION Bilateral 07/2016   WISDOM TOOTH EXTRACTION      There were no vitals filed for this visit.   Subjective Assessment - 03/16/21 1314      Subjective Not much new since last session. Not wearing any knee braces and has not taken Advil today to see how he feels. Has not been able to do a lot of his exercises d/t his brother in law passing away.    Pertinent History none    Diagnostic tests Per MD- "Independent review of the CT lumbar spine from 2/15 shows stable interbody fusion at L4/5 and degenerative disc disease at L5-S1.    Independent review of the CT cervical spine 2/15 shows severe degenerative change at C5-6.    Independent review the B knee x-ray from 2/15 shows no acute changes."    Patient Stated Goals decrease pain and dizziness    Currently in Pain? Yes    Pain Score 3     Pain Location Knee    Pain Orientation Right;Left    Pain Descriptors / Indicators Sharp;Constant    Pain Type Chronic pain                               [facility] Adult PT Treatment/Exercise - 03/16/21 0001       Lumbar Exercises: Aerobic   Recumbent Bike L4 x 6 min      Lumbar Exercises: Seated   Sit to Stand  10 reps   red TB above knees, holding blue medball at chest     Lumbar Exercises: Quadruped   Madcat/Old Horse 10 reps   heavy manual and verbal cues for coordination of movement   Single Arm Raise Right;Left;10 reps    Single Arm Raise Weights (lbs) alt   good neutral spine   Other Quadruped Lumbar Exercises child's pose 10x10"   good tolerance   Other Quadruped Lumbar Exercises quadruped alt LE lift 2x5 each   falling into lordosis, hip instability     Knee/Hip Exercises: Machines for Strengthening   Cybex Leg Press 20# 10x, 10x 30#      Knee/Hip Exercises: Standing   Hip Flexion Stengthening;Both;1 set;20 reps;Knee bent    Hip Flexion Limitations 2x20 resisted march with red TB and 2 ski poles   cues to maintain trunk upright   Step Down Right;Left;5 reps;Hand Hold: 0;Step Height: 4";2 sets;Step Height: 6"    Step Down Limitations R/L step up/over slow   cueing to decrease speed and hit heel first   Other  Standing Knee Exercises side stepping with red TB 4x 18f   improved form                     PT Short Term Goals - 02/16/21 1501       PT SHORT TERM GOAL #1   Title Patient to be independent with initial HEP.    Time 3    Period Weeks    Status Achieved    Target Date 11/26/20               PT Long Term Goals - 02/16/21 1501       PT LONG TERM GOAL #1   Title Patient to be independent with advanced HEP.    Time 6    Period Weeks    Status Partially Met   met for current   Target Date 03/30/21      PT LONG TERM GOAL #2   Title Patient to demonstrate L UE strength >/=4+/5.    Time 6    Period Weeks    Status Partially Met   still limited in B shoulder flexion, B wrist, B grip   Target Date 03/30/21      PT LONG TERM GOAL #3   Title Patient to demonstrate B LE strength >/=4+/5.    Time 6    Period Weeks    Status Partially Met   improved in hips, still limited in B knees   Target Date 03/30/21      PT LONG TERM GOAL #4   Title Patient to demonstrate cervical AROM WFL and without pain limiting.    Time 8    Period Weeks    Status Achieved   improved in flexion, extension, and R sidebending     PT LONG TERM GOAL #5   Title Patient to report tolerance for kneeling to pray d/t improvement in pain levels.    Time 6    Period Weeks    Status On-going   unable d/t B knee pain   Target Date 03/30/21      PT LONG TERM GOAL #6   Title Patient to report tolerance for 2 hours on his feet without pain limiting.    Time 6    Period Weeks    Status On-going   reports 15-20 min   Target Date 03/30/21      PT LONG TERM GOAL #7  Title Patient to report 90% improvement in dizziness.    Time 8    Period Weeks    Status Achieved   reports 90% improvement                  Plan - 03/16/21 1401     Clinical Impression Statement Patient arrived to session without new complaints. Patient performed STS transfers with additional challenge with cueing  to increase R weight shift. Anterior step downs required cueing to decrease speed and hit heel first, however patient overall with fairly good stability throughout. Proceeded with quadruped stretching and core strengthening with patient demonstrating difficulty with motor control and coordination. Tendency was observed to fall into lumbar lordosis with more challenge exercises. Patient was able to tolerate increased weight with leg press today. Patient tolerated duration of session well and without complaints at end of session.    Personal Factors and Comorbidities Age;Comorbidity 1;Fitness;Past/Current Experience;Time since onset of injury/illness/exacerbation    Comorbidities back surgery L3-4 1997    PT Frequency 2x / week    PT Duration 6 weeks    PT Treatment/Interventions ADLs/Self Care Home Management;Canalith Repostioning;Cryotherapy;Electrical Stimulation;Iontophoresis 39m/ml Dexamethasone;Moist Heat;Traction;Balance training;Therapeutic exercise;Therapeutic activities;Functional mobility training;Stair training;Gait training;Ultrasound;Neuromuscular re-education;Patient/family education;Manual techniques;Vestibular;Vasopneumatic Device;Taping;Energy conservation;Dry needling;Passive range of motion;Scar mobilization    PT Next Visit Plan hip flexor/quad stretchingaddress B wrist and hand weakness, knee strength, and ability to kneel for praying.    Consulted and Agree with Plan of Care Patient             Patient will benefit from skilled therapeutic intervention in order to improve the following deficits and impairments:  Abnormal gait, Hypomobility, Decreased activity tolerance, Decreased strength, Increased fascial restricitons, Impaired UE functional use, Pain, Difficulty walking, Decreased balance, Increased muscle spasms, Improper body mechanics, Dizziness, Decreased range of motion, Impaired flexibility, Postural dysfunction  Visit Diagnosis: Chronic pain of left knee  Chronic  pain of right knee  Bilateral low back pain, unspecified chronicity, unspecified whether sciatica present  Muscle weakness (generalized)  Difficulty in walking, not elsewhere classified  Acute pain of right knee  Pain in right hand  Stiffness of right hand, not elsewhere classified     Problem List Patient Active Problem List   Diagnosis Date Noted   OSA on CPAP 02/19/2021   Bite, snake 01/28/2021   Neck pain 11/13/2020   Vertigo 11/13/2020   Hypertension 11/03/2020   Contusion of bone 10/23/2020   Degenerative tear of left medial meniscus 10/23/2020   Concussion with no loss of consciousness 10/23/2020   Paroxysmal atrial fibrillation ([place] 05/22/2019   Unspecified atrial flutter ([place] 05/22/2019   Pain in right foot 03/30/2018   Perennial and seasonal allergic rhinitis 11/09/2017   History of food allergy 11/09/2017   Wheezing 11/09/2017   Dyspnea/wheezing 10/31/2017   Cough 10/31/2017   Bronchitis, mucopurulent recurrent ([place] 09/07/2017   SVT (supraventricular tachycardia) ([place] 04/23/2015   Abdominal pain, epigastric 06/03/2013   Unspecified vitamin D deficiency 04/09/2013   Esophageal reflux 04/09/2013   Hepatitis B carrier ([place] 04/09/2013   Other and unspecified hyperlipidemia 04/09/2013   Unspecified sleep apnea 04/09/2013    YJanene Harvey PT, DPT 03/16/21 2:05 PM    [facility] [address] [place] [address]Phone: [phone]  Fax:  [phone] Name: SJARVIN OGRENMRN: [id number] of Birth: 101-07-1956

## 2021-03-18 ENCOUNTER — Ambulatory Visit: Payer: Medicare Other

## 2021-03-18 ENCOUNTER — Other Ambulatory Visit: Payer: Self-pay

## 2021-03-18 ENCOUNTER — Ambulatory Visit (INDEPENDENT_AMBULATORY_CARE_PROVIDER_SITE_OTHER): Payer: Medicare Other | Admitting: Neurology

## 2021-03-18 DIAGNOSIS — M25641 Stiffness of right hand, not elsewhere classified: Secondary | ICD-10-CM

## 2021-03-18 DIAGNOSIS — M79641 Pain in right hand: Secondary | ICD-10-CM

## 2021-03-18 DIAGNOSIS — I471 Supraventricular tachycardia: Secondary | ICD-10-CM

## 2021-03-18 DIAGNOSIS — J411 Mucopurulent chronic bronchitis: Secondary | ICD-10-CM

## 2021-03-18 DIAGNOSIS — R0600 Dyspnea, unspecified: Secondary | ICD-10-CM

## 2021-03-18 DIAGNOSIS — M25561 Pain in right knee: Secondary | ICD-10-CM

## 2021-03-18 DIAGNOSIS — M25562 Pain in left knee: Secondary | ICD-10-CM

## 2021-03-18 DIAGNOSIS — G8929 Other chronic pain: Secondary | ICD-10-CM

## 2021-03-18 DIAGNOSIS — R0609 Other forms of dyspnea: Secondary | ICD-10-CM

## 2021-03-18 DIAGNOSIS — M545 Low back pain, unspecified: Secondary | ICD-10-CM

## 2021-03-18 DIAGNOSIS — G4733 Obstructive sleep apnea (adult) (pediatric): Secondary | ICD-10-CM

## 2021-03-18 DIAGNOSIS — G4730 Sleep apnea, unspecified: Secondary | ICD-10-CM

## 2021-03-18 DIAGNOSIS — M6281 Muscle weakness (generalized): Secondary | ICD-10-CM

## 2021-03-18 DIAGNOSIS — R262 Difficulty in walking, not elsewhere classified: Secondary | ICD-10-CM

## 2021-03-18 DIAGNOSIS — I480 Paroxysmal atrial fibrillation: Secondary | ICD-10-CM

## 2021-03-18 NOTE — Therapy (Signed)
[facility] [address]  [place], [place], [address] Phone: [phone]   Fax:  [phone]  Physical Therapy Treatment  Patient Details  Name: Richard Walker MRN: [id number] Date of Birth: 06-28-1956 Referring Provider (PT): Clearance Coots, MD   Encounter Date: 03/18/2021   PT End of Session - 03/18/21 1402     Visit Number 28    Number of Visits 32    Date for PT Re-Evaluation 03/30/21    Authorization Type Medicare & Traditional Medicaid    PT Start Time 1315    PT Stop Time 6712    PT Time Calculation (min) 44 min    Activity Tolerance Patient tolerated treatment well    Behavior During Therapy [facility] for tasks assessed/performed             Past Medical History:  Diagnosis Date   DDD (degenerative disc disease), lumbar    L3/L4   Diverticulosis    GERD (gastroesophageal reflux disease)    Hyperlipidemia    Low sperm motility    Pre-diabetes    Sleep apnea    uses cpap    SVT (supraventricular tachycardia) ([place])     Past Surgical History:  Procedure Laterality Date   AMPUTATION Left 07/15/2018   Procedure: . REPAIR OF LEFT INDEX AND LONG FINGER BY FUSION /  REPAIR OF RING FINGER;  Surgeon: Milly Jakob, MD;  Location: [place];  Service: Orthopedics;  Laterality: Left;   BACK SURGERY     CARDIAC CATHETERIZATION N/A 12/22/2015   Procedure: Left Heart Cath and Coronary Angiography;  Surgeon: Peter M Martinique, MD;  Location: [place] [facility];  Service: Cardiovascular;  Laterality: N/A;   CARDIAC ELECTROPHYSIOLOGY STUDY AND ABLATION     ELECTROPHYSIOLOGIC STUDY N/A 02/19/2016   Procedure: SVT Ablation;  Surgeon: Evans Lance, MD;  Location: [place] [facility];  Service: Cardiovascular;  Laterality: N/A;   septal reconstruction  2017   TURBINATE REDUCTION Bilateral 07/2016   WISDOM TOOTH EXTRACTION      There were no vitals filed for this visit.   Subjective Assessment - 03/18/21 1317      Subjective Pt notes that his is seeing imporvement in his knee pain.    Pertinent History none    Diagnostic tests Per MD- "Independent review of the CT lumbar spine from 2/15 shows stable interbody fusion at L4/5 and degenerative disc disease at L5-S1.    Independent review of the CT cervical spine 2/15 shows severe degenerative change at C5-6.    Independent review the B knee x-ray from 2/15 shows no acute changes."    Patient Stated Goals decrease pain and dizziness    Currently in Pain? Yes    Pain Score 3     Pain Location Knee    Pain Orientation Right;Left    Pain Descriptors / Indicators Sharp;Constant    Pain Type Chronic pain                               [facility] Adult PT Treatment/Exercise - 03/18/21 0001       Lumbar Exercises: Aerobic   Recumbent Bike L4x76mn      Lumbar Exercises: Machines for Strengthening   Other Lumbar Machine Exercise lat pulls standing 20# 10 reps      Knee/Hip Exercises: Stretches   QPension scheme managerreps;30 seconds    Quad Stretch Limitations prone withh  strap    Hip Flexor Stretch 2 reps;30 seconds;Left    Hip Flexor Stretch Limitations mod thomas with strap   cues to maintain hip extension     Knee/Hip Exercises: Supine   Bridges with Ball Squeeze Strengthening;Both;10 reps   5 second hold     Knee/Hip Exercises: Sidelying   Clams 10x3" with green TB      Knee/Hip Exercises: Prone   Hamstring Curl 10 reps    Hamstring Curl Limitations 3# weight    Hip Extension Strengthening;Both;10 reps    Hip Extension Limitations knee bent; cues to keep knee bent      Manual Therapy   Manual Therapy Joint mobilization    Joint Mobilization B knees TF joint distraction with prolonged holds; hip distraction with prolonged holds                      PT Short Term Goals - 02/16/21 1501       PT SHORT TERM GOAL #1   Title Patient to be independent with initial HEP.    Time 3    Period Weeks    Status Achieved     Target Date 11/26/20               PT Long Term Goals - 02/16/21 1501       PT LONG TERM GOAL #1   Title Patient to be independent with advanced HEP.    Time 6    Period Weeks    Status Partially Met   met for current   Target Date 03/30/21      PT LONG TERM GOAL #2   Title Patient to demonstrate L UE strength >/=4+/5.    Time 6    Period Weeks    Status Partially Met   still limited in B shoulder flexion, B wrist, B grip   Target Date 03/30/21      PT LONG TERM GOAL #3   Title Patient to demonstrate B LE strength >/=4+/5.    Time 6    Period Weeks    Status Partially Met   improved in hips, still limited in B knees   Target Date 03/30/21      PT LONG TERM GOAL #4   Title Patient to demonstrate cervical AROM WFL and without pain limiting.    Time 8    Period Weeks    Status Achieved   improved in flexion, extension, and R sidebending     PT LONG TERM GOAL #5   Title Patient to report tolerance for kneeling to pray d/t improvement in pain levels.    Time 6    Period Weeks    Status On-going   unable d/t B knee pain   Target Date 03/30/21      PT LONG TERM GOAL #6   Title Patient to report tolerance for 2 hours on his feet without pain limiting.    Time 6    Period Weeks    Status On-going   reports 15-20 min   Target Date 03/30/21      PT LONG TERM GOAL #7   Title Patient to report 90% improvement in dizziness.    Time 8    Period Weeks    Status Achieved   reports 90% improvement                  Plan - 03/18/21 1403     Clinical Impression Statement Pt noted small improvement  from the joint distractions today. He continues to report improvement with quad stretches. Also did hip and posterior knee strengthening to improve lateral pull on TF joint to reduce medial stress. Cues required to keep knees bent with hip extension. He showed increased effort with bridges but tolerated exercises well.    Personal Factors and Comorbidities  Age;Comorbidity 1;Fitness;Past/Current Experience;Time since onset of injury/illness/exacerbation    Comorbidities back surgery L3-4 1997    PT Frequency 2x / week    PT Duration 6 weeks    PT Treatment/Interventions ADLs/Self Care Home Management;Canalith Repostioning;Cryotherapy;Electrical Stimulation;Iontophoresis 51m/ml Dexamethasone;Moist Heat;Traction;Balance training;Therapeutic exercise;Therapeutic activities;Functional mobility training;Stair training;Gait training;Ultrasound;Neuromuscular re-education;Patient/family education;Manual techniques;Vestibular;Vasopneumatic Device;Taping;Energy conservation;Dry needling;Passive range of motion;Scar mobilization    PT Next Visit Plan hip flexor/quad stretchingaddress B wrist and hand weakness, knee strength, and ability to kneel for praying.    Consulted and Agree with Plan of Care Patient             Patient will benefit from skilled therapeutic intervention in order to improve the following deficits and impairments:  Abnormal gait, Hypomobility, Decreased activity tolerance, Decreased strength, Increased fascial restricitons, Impaired UE functional use, Pain, Difficulty walking, Decreased balance, Increased muscle spasms, Improper body mechanics, Dizziness, Decreased range of motion, Impaired flexibility, Postural dysfunction  Visit Diagnosis: Chronic pain of left knee  Chronic pain of right knee  Bilateral low back pain, unspecified chronicity, unspecified whether sciatica present  Muscle weakness (generalized)  Difficulty in walking, not elsewhere classified  Acute pain of right knee  Pain in right hand  Stiffness of right hand, not elsewhere classified     Problem List Patient Active Problem List   Diagnosis Date Noted   OSA on CPAP 02/19/2021   Bite, snake 01/28/2021   Neck pain 11/13/2020   Vertigo 11/13/2020   Hypertension 11/03/2020   Contusion of bone 10/23/2020   Degenerative tear of left medial meniscus  10/23/2020   Concussion with no loss of consciousness 10/23/2020   Paroxysmal atrial fibrillation ([place] 05/22/2019   Unspecified atrial flutter ([place] 05/22/2019   Pain in right foot 03/30/2018   Perennial and seasonal allergic rhinitis 11/09/2017   History of food allergy 11/09/2017   Wheezing 11/09/2017   Dyspnea/wheezing 10/31/2017   Cough 10/31/2017   Bronchitis, mucopurulent recurrent ([place] 09/07/2017   SVT (supraventricular tachycardia) ([place] 04/23/2015   Abdominal pain, epigastric 06/03/2013   Unspecified vitamin D deficiency 04/09/2013   Esophageal reflux 04/09/2013   Hepatitis B carrier ([place] 04/09/2013   Other and unspecified hyperlipidemia 04/09/2013   Unspecified sleep apnea 04/09/2013    BArtist Pais PTA 03/18/2021, 2:34 PM  [facility] [place] [address]Phone: [phone]  Fax:  [phone] Name: Richard BROADFOOTMRN: [id number] of Birth: [birth year]

## 2021-03-19 NOTE — Progress Notes (Signed)
    [facility]   HOME SLEEP TEST REPORT ( by Watch PAT)   STUDY DATE: 03-18-2021  DOB:  12/05/1959 MRN: [id number]   ORDERING CLINICIAN: Carmen Dohmeier,MD  REFERRING CLINICIAN:    CLINICAL INFORMATION/HISTORY: Richard Walker was seen on 19 February 2021 (aged 65) as an established patient of his obstructive sleep apnea on CPAP.  He has an extensive cardiac history.  He tends to be very fatigued and his sleepiness score was still endorsed high at 15 points.  He has been compliant 84% of the time and is using the machine on average 7 hours and 23 minutes at night.  95th percentile pressure is 7.5 cm his residual AHI is 1.0.  His CPAP is needing to be replaced as it is 65 years old.  In addition, I wonder if his fatigue is related to ongoing hypoxia.   Epworth sleepiness score: 15/24.  BMI: 36.6 kg/m  Neck Circumference: 19"    Sleep Summary:   Total Recording Time (hours, min): The total recording time for this home sleep test was 9 hours and 57 minutes of which 6 hours and 22 minutes were actual sleep time.  The percentage of REM sleep on the total sleep time was 10.6%.  This equaled 38 minutes.                                    Respiratory Indices:   Calculated pAHI (per hour): Apnea hypopnea index overall was 50.9/h (which is indicative of a very severe sleep apne.  The majority of events led to an oxygen desaturation of 4% or more.  The REM sleep dependent apnea-hypopnea index was only 14.9/h which is usually a sign that the patient has more central than obstructive sleep apnea.  His non-REM sleep AHI was 55.2/h..                           Supine AHI: Apnea-hypopnea index in supine position was 66.2/h and in nonsupine positions 33.5/h.   Snoring level and decibels was 43 dB the RDI was also very high at 55.7/h.                                                Oxygen Saturation Statistics:   O2 Saturation Range (%):    a minimum oxygenation of 85% and a maximum oxygen saturation of 99%  , placing the mean oxygen saturations at 95%.  The patient only experienced 0.2 minutes of total sleep time at or below 88% saturation.    Pulse Rate Statistics:       Pulse Range:   between 44 and 81bpm.  With a mean heart rate of 53 bpm.  No information as to the cardiac rhythm can be made.  Hypnograph:  This home sleep test confirmed very fragmented sleep by her milligram.  The patient seemed to sleep somewhat more consistently between 1 AM and 7:30 AM.  Only 2 phases of REM sleep were noted between 3 AM and 330 and another short time between 5.35 and 5.15.              IMPRESSION:  This HST confirms the presence of severe sleep apnea but the non-REM sleep dependent form of sleep   sleep apnea raises the index of suspicion for central apnea to be present.  The patient has a history of paroxysmal atrial fibrillation, SVT.    RECOMMENDATION: Since the patient has so far been doing rather well with CPAP I will renew his CPAP prescription for an auto titration device between 6 and 15 cmH2O, 3 cm EPR and a mask of his choice.  We did not find evidence of sleep hypoxia which could have contributed to his fatigue.  I would like for him to use heated humidification at the level he is comfortable with.    INTERPRETING PHYSICIAN:  Melvyn Novas, MD                                                      Medical Director of [facility] at [facility].

## 2021-03-23 ENCOUNTER — Other Ambulatory Visit: Payer: Self-pay

## 2021-03-23 ENCOUNTER — Ambulatory Visit: Payer: Medicare Other | Attending: Family Medicine

## 2021-03-23 DIAGNOSIS — M545 Low back pain, unspecified: Secondary | ICD-10-CM | POA: Diagnosis present

## 2021-03-23 DIAGNOSIS — R262 Difficulty in walking, not elsewhere classified: Secondary | ICD-10-CM | POA: Diagnosis present

## 2021-03-23 DIAGNOSIS — M25641 Stiffness of right hand, not elsewhere classified: Secondary | ICD-10-CM | POA: Insufficient documentation

## 2021-03-23 DIAGNOSIS — M79641 Pain in right hand: Secondary | ICD-10-CM | POA: Insufficient documentation

## 2021-03-23 DIAGNOSIS — M25562 Pain in left knee: Secondary | ICD-10-CM | POA: Insufficient documentation

## 2021-03-23 DIAGNOSIS — G8929 Other chronic pain: Secondary | ICD-10-CM | POA: Diagnosis present

## 2021-03-23 DIAGNOSIS — M6281 Muscle weakness (generalized): Secondary | ICD-10-CM | POA: Diagnosis present

## 2021-03-23 DIAGNOSIS — M25561 Pain in right knee: Secondary | ICD-10-CM | POA: Diagnosis present

## 2021-03-23 NOTE — Therapy (Signed)
[facility] [address]  [place], [place], [address] Phone: [phone]   Fax:  [phone]  Physical Therapy Treatment  Patient Details  Name: Richard Walker MRN: [id number] Date of Birth: [birth year] Referring Provider (PT): Clearance Coots, MD   Encounter Date: 03/23/2021   PT End of Session - 03/23/21 1415     Visit Number 29    Number of Visits 32    Date for PT Re-Evaluation 03/30/21    Authorization Type Medicare & Traditional Medicaid    PT Start Time 1316    PT Stop Time 3151    PT Time Calculation (min) 47 min    Activity Tolerance Patient tolerated treatment well    Behavior During Therapy [facility] for tasks assessed/performed             Past Medical History:  Diagnosis Date   DDD (degenerative disc disease), lumbar    L3/L4   Diverticulosis    GERD (gastroesophageal reflux disease)    Hyperlipidemia    Low sperm motility    Pre-diabetes    Sleep apnea    uses cpap    SVT (supraventricular tachycardia) ([place])     Past Surgical History:  Procedure Laterality Date   AMPUTATION Left 07/15/2018   Procedure: . REPAIR OF LEFT INDEX AND LONG FINGER BY FUSION /  REPAIR OF RING FINGER;  Surgeon: Milly Jakob, MD;  Location: [facility];  Service: Orthopedics;  Laterality: Left;   BACK SURGERY     CARDIAC CATHETERIZATION N/A 12/22/2015   Procedure: Left Heart Cath and Coronary Angiography;  Surgeon: Peter M Martinique, MD;  Location: [facility];  Service: Cardiovascular;  Laterality: N/A;   CARDIAC ELECTROPHYSIOLOGY STUDY AND ABLATION     ELECTROPHYSIOLOGIC STUDY N/A 02/19/2016   Procedure: SVT Ablation;  Surgeon: Evans Lance, MD;  Location: [place] [facility];  Service: Cardiovascular;  Laterality: N/A;   septal reconstruction  2017   TURBINATE REDUCTION Bilateral 07/2016   WISDOM TOOTH EXTRACTION      There were no vitals filed for this visit.   Subjective Assessment - 03/23/21 1317      Subjective Having more pain today because he did not take his pain medication.    Pertinent History none    Diagnostic tests Per MD- "Independent review of the CT lumbar spine from 2/15 shows stable interbody fusion at L4/5 and degenerative disc disease at L5-S1.    Independent review of the CT cervical spine 2/15 shows severe degenerative change at C5-6.    Independent review the B knee x-ray from 2/15 shows no acute changes."    Patient Stated Goals decrease pain and dizziness    Currently in Pain? Yes    Pain Score 4     Pain Location Knee    Pain Orientation Right;Left    Pain Descriptors / Indicators Aching    Pain Type Chronic pain                [facility] PT Assessment - 03/23/21 0001       Strength   Right Shoulder Flexion 4+/5    Left Shoulder Flexion 4+/5    Right Wrist Flexion 4/5    Right Wrist Extension 4/5    Right Knee Flexion 4+/5    Right Knee Extension 4+/5    Left Knee Flexion 4+/5    Left Knee Extension 4+/5  [facility] Adult PT Treatment/Exercise - 03/23/21 0001       Knee/Hip Exercises: Stretches   Gastroc Stretch Both;30 seconds    Gastroc Stretch Limitations with blue assist tool    Other Knee/Hip Stretches B knee flexion 4x10"      Knee/Hip Exercises: Aerobic   Nustep L5 x 6 min (UEs/LEs)      Knee/Hip Exercises: Standing   Hip Extension Stengthening;Both;10 reps;Knee bent    Extension Limitations counter support    Forward Step Up Both;10 reps;Hand Hold: 2;Step Height: 8"      Manual Therapy   Manual Therapy Soft tissue mobilization;Passive ROM    Soft tissue mobilization IASTM with rolling stick to B ITB and quads    Passive ROM B passive hs and ITB stretching with prolonged holds                      PT Short Term Goals - 02/16/21 1501       PT SHORT TERM GOAL #1   Title Patient to be independent with initial HEP.    Time 3    Period Weeks    Status Achieved    Target Date 11/26/20                PT Long Term Goals - 03/23/21 1331       PT LONG TERM GOAL #1   Title Patient to be independent with advanced HEP.    Time 6    Period Weeks    Status Partially Met   met for current     PT LONG TERM GOAL #2   Title Patient to demonstrate L UE strength >/=4+/5.    Time 6    Period Weeks    Status Partially Met   still limited in R wrist, B grip     PT LONG TERM GOAL #3   Title Patient to demonstrate B LE strength >/=4+/5.    Time 6    Period Weeks    Status Achieved   met in hips and knees     PT LONG TERM GOAL #4   Title Patient to demonstrate cervical AROM WFL and without pain limiting.    Time 8    Period Weeks    Status Achieved   improved in flexion, extension, and R sidebending     PT LONG TERM GOAL #5   Title Patient to report tolerance for kneeling to pray d/t improvement in pain levels.    Time 6    Period Weeks    Status On-going   unable d/t B knee pain     PT LONG TERM GOAL #6   Title Patient to report tolerance for 2 hours on his feet without pain limiting.    Time 6    Period Weeks    Status On-going   reports 15-20 min     PT LONG TERM GOAL #7   Title Patient to report 90% improvement in dizziness.    Time 8    Period Weeks    Status Achieved   reports 90% improvement                  Plan - 03/23/21 1417     Clinical Impression Statement Pt demonstrates improvements with UE and LE strength.  Weakness still limits the R wrist but he has met his LE strength goal. Pt still noting pain in knees after standing for 15-20 min. Cues required with step ups for  knee positioning to reduce valgus stress. Followed exercises with IASTM for the quads and ITB, considerable tightness/TTP noted along these areas. Pt responded well to treatment, may try more manual work next session to address remaining pain in knees.    Personal Factors and Comorbidities Age;Comorbidity 1;Fitness;Past/Current Experience;Time since onset of  injury/illness/exacerbation    Comorbidities back surgery L3-4 1997    PT Frequency 2x / week    PT Duration 6 weeks    PT Treatment/Interventions ADLs/Self Care Home Management;Canalith Repostioning;Cryotherapy;Electrical Stimulation;Iontophoresis 94m/ml Dexamethasone;Moist Heat;Traction;Balance training;Therapeutic exercise;Therapeutic activities;Functional mobility training;Stair training;Gait training;Ultrasound;Neuromuscular re-education;Patient/family education;Manual techniques;Vestibular;Vasopneumatic Device;Taping;Energy conservation;Dry needling;Passive range of motion;Scar mobilization    PT Next Visit Plan hip flexor/quad stretching; address B wrist and hand weakness, knee strength, and ability to kneel for praying.    Consulted and Agree with Plan of Care Patient             Patient will benefit from skilled therapeutic intervention in order to improve the following deficits and impairments:  Abnormal gait, Hypomobility, Decreased activity tolerance, Decreased strength, Increased fascial restricitons, Impaired UE functional use, Pain, Difficulty walking, Decreased balance, Increased muscle spasms, Improper body mechanics, Dizziness, Decreased range of motion, Impaired flexibility, Postural dysfunction  Visit Diagnosis: Chronic pain of left knee  Chronic pain of right knee  Bilateral low back pain, unspecified chronicity, unspecified whether sciatica present  Muscle weakness (generalized)  Difficulty in walking, not elsewhere classified  Acute pain of right knee  Pain in right hand  Stiffness of right hand, not elsewhere classified     Problem List Patient Active Problem List   Diagnosis Date Noted   OSA on CPAP 02/19/2021   Bite, snake 01/28/2021   Neck pain 11/13/2020   Vertigo 11/13/2020   Hypertension 11/03/2020   Contusion of bone 10/23/2020   Degenerative tear of left medial meniscus 10/23/2020   Concussion with no loss of consciousness 10/23/2020    Parox[place] ([facility] 05/22/2019   Unspecified atrial flutter ([facility] 05/22/2019   Pain in right foot 03/30/2018   Perennial and seasonal allergic rhinitis 11/09/2017   History of food allergy 11/09/2017   Wheezing 11/09/2017   Dyspnea/wheezing 10/31/2017   Cough 10/31/2017   Bronchitis, mucopurulent recurrent ([facility] 09/07/2017   SVT (supraventricular tachycardia) ([facility] 04/23/2015   Abdominal pain, epigastric 06/03/2013   Unspecified vitamin D deficiency 04/09/2013   Esophageal reflux 04/09/2013   Hepatitis B carrier ([facility] 04/09/2013   Other and unspecified hyperlipidemia 04/09/2013   Unspecified sleep apnea 04/09/2013    BArtist Pais PTA 03/23/2021, 2:27 PM  [facility] [address] [place] [address]Phone: [phone]  Fax:  [phone] Name: Richard UDELLMRN: [id number] of Birth: [birth year]

## 2021-03-25 ENCOUNTER — Ambulatory Visit: Payer: Medicare Other

## 2021-03-25 ENCOUNTER — Other Ambulatory Visit: Payer: Self-pay

## 2021-03-25 DIAGNOSIS — R262 Difficulty in walking, not elsewhere classified: Secondary | ICD-10-CM

## 2021-03-25 DIAGNOSIS — M25562 Pain in left knee: Secondary | ICD-10-CM | POA: Diagnosis not present

## 2021-03-25 DIAGNOSIS — M25641 Stiffness of right hand, not elsewhere classified: Secondary | ICD-10-CM

## 2021-03-25 DIAGNOSIS — M79641 Pain in right hand: Secondary | ICD-10-CM

## 2021-03-25 DIAGNOSIS — M545 Low back pain, unspecified: Secondary | ICD-10-CM

## 2021-03-25 DIAGNOSIS — G8929 Other chronic pain: Secondary | ICD-10-CM

## 2021-03-25 DIAGNOSIS — M6281 Muscle weakness (generalized): Secondary | ICD-10-CM

## 2021-03-25 DIAGNOSIS — M25561 Pain in right knee: Secondary | ICD-10-CM

## 2021-03-25 NOTE — Therapy (Signed)
[facility] [address]  [place], [place], [address] Phone: [phone]   Fax:  [phone]  Physical Therapy Treatment/Progress Note  Patient Details  Name: Richard Walker MRN: [id number] Date of Birth: April 07, 1956 Referring Provider (PT): Clearance Coots, MD   Encounter Date: 03/25/2021   PT End of Session - 03/25/21 1507     Visit Number 30    Number of Visits 32    Date for PT Re-Evaluation 03/30/21    Authorization Type Medicare & Traditional Medicaid    PT Start Time 1316    PT Stop Time 1400    PT Time Calculation (min) 44 min    Activity Tolerance Patient tolerated treatment well    Behavior During Therapy [facility] for tasks assessed/performed             Past Medical History:  Diagnosis Date   DDD (degenerative disc disease), lumbar    L3/L4   Diverticulosis    GERD (gastroesophageal reflux disease)    Hyperlipidemia    Low sperm motility    Pre-diabetes    Sleep apnea    uses cpap    SVT (supraventricular tachycardia) ([place])     Past Surgical History:  Procedure Laterality Date   AMPUTATION Left 07/15/2018   Procedure: . REPAIR OF LEFT INDEX AND LONG FINGER BY FUSION /  REPAIR OF RING FINGER;  Surgeon: Milly Jakob, MD;  Location: [facility];  Service: Orthopedics;  Laterality: Left;   BACK SURGERY     CARDIAC CATHETERIZATION N/A 12/22/2015   Procedure: Left Heart Cath and Coronary Angiography;  Surgeon: Peter M Martinique, MD;  Location: [facility];  Service: Cardiovascular;  Laterality: N/A;   CARDIAC ELECTROPHYSIOLOGY STUDY AND ABLATION     ELECTROPHYSIOLOGIC STUDY N/A 02/19/2016   Procedure: SVT Ablation;  Surgeon: Evans Lance, MD;  Location: [facility];  Service: Cardiovascular;  Laterality: N/A;   septal reconstruction  2017   TURBINATE REDUCTION Bilateral 07/2016   WISDOM TOOTH EXTRACTION      There were no vitals filed for this visit.   Subjective Assessment - 03/25/21  1317     Subjective Knees are doing better today than Monday.    Pertinent History none    Diagnostic tests Per MD- "Independent review of the CT lumbar spine from 2/15 shows stable interbody fusion at L4/5 and degenerative disc disease at L5-S1.    Independent review of the CT cervical spine 2/15 shows severe degenerative change at C5-6.    Independent review the B knee x-ray from 2/15 shows no acute changes."    Patient Stated Goals decrease pain and dizziness    Currently in Pain? Yes    Pain Score 3     Pain Location Knee    Pain Orientation Right;Left    Pain Descriptors / Indicators Aching    Pain Type Chronic pain                [facility] PT Assessment - 03/25/21 0001       Assessment   Medical Diagnosis Contusion of bone, degenerative tear of L medial meniscus, concussion without LOC    Referring Provider (PT) Clearance Coots, MD    Onset Date/Surgical Date 10/05/20      Strength   Right Wrist Flexion 4/5    Right Wrist Extension 4/5    Right Hand Grip (lbs) 26  [facility] Adult PT Treatment/Exercise - 03/25/21 0001       Knee/Hip Exercises: Stretches   Gastroc Stretch Both;4 reps;30 seconds    Gastroc Stretch Limitations 2 reps with strap in long sitting and 2 standing at wall      Knee/Hip Exercises: Aerobic   Recumbent Bike [id number]      Manual Therapy   Manual Therapy Soft tissue mobilization    Soft tissue mobilization IASTM with rolling stick to B ITB, quads, and gastroc                    PT Education - 03/25/21 1508     Education Details edu on modifing activites to reduce knee pain, progress and outlook for remainder of PT.    Person(s) Educated Patient    Methods Explanation;Demonstration    Comprehension Verbalized understanding;Returned demonstration              PT Short Term Goals - 02/16/21 1501       PT SHORT TERM GOAL #1   Title Patient to be independent with initial HEP.    Time 3     Period Weeks    Status Achieved    Target Date 11/26/20               PT Long Term Goals - 03/25/21 1325       PT LONG TERM GOAL #1   Title Patient to be independent with advanced HEP.    Time 6    Period Weeks    Status Partially Met   met for current     PT LONG TERM GOAL #2   Title Patient to demonstrate L UE strength >/=4+/5.    Time 6    Period Weeks    Status Partially Met   still limited in R wrist, B grip     PT LONG TERM GOAL #3   Title Patient to demonstrate B LE strength >/=4+/5.    Time 6    Period Weeks    Status Achieved   met in hips and knees     PT LONG TERM GOAL #4   Title Patient to demonstrate cervical AROM WFL and without pain limiting.    Time 8    Period Weeks    Status Achieved   improved in flexion, extension, and R sidebending     PT LONG TERM GOAL #5   Title Patient to report tolerance for kneeling to pray d/t improvement in pain levels.    Time 6    Period Weeks    Status On-going   unable d/t B knee pain but stays in sitting position with LEs supported     PT LONG TERM GOAL #6   Title Patient to report tolerance for 2 hours on his feet without pain limiting.    Time 6    Period Weeks    Status On-going   reports 15-20 min     PT LONG TERM GOAL #7   Title Patient to report 90% improvement in dizziness.    Time 8    Period Weeks    Status Achieved   reports 90% improvement                  Plan - 03/25/21 1510     Clinical Impression Statement Pt demonstrates improvements with general strength in UEs and LEs but still remains limited with wrist and grip strength. He notes that kneeling to pray is still challenging mainly  due to issues with getting up from the position. He has a modified sitting version and we talked about ways to reduce stress on the knees with this. Standing tolerance is still limited to ~15 min w/o pain limiting. He noted most benefit lately from quad and gatroc stretches along with STM. Educated him  on using a rolling pin for self IASTM at home. He has made improvements with modifications to allow for ease of certain functional activites. D/t his progress we anticipate him going on a 30 day hold or D/C next session.    Personal Factors and Comorbidities Age;Comorbidity 1;Fitness;Past/Current Experience;Time since onset of injury/illness/exacerbation    Comorbidities back surgery L3-4 1997    PT Frequency 2x / week    PT Duration 6 weeks    PT Treatment/Interventions ADLs/Self Care Home Management;Canalith Repostioning;Cryotherapy;Electrical Stimulation;Iontophoresis 75m/ml Dexamethasone;Moist Heat;Traction;Balance training;Therapeutic exercise;Therapeutic activities;Functional mobility training;Stair training;Gait training;Ultrasound;Neuromuscular re-education;Patient/family education;Manual techniques;Vestibular;Vasopneumatic Device;Taping;Energy conservation;Dry needling;Passive range of motion;Scar mobilization    PT Next Visit Plan review HEP; 30 day hold or DC    Consulted and Agree with Plan of Care Patient             Patient will benefit from skilled therapeutic intervention in order to improve the following deficits and impairments:  Abnormal gait, Hypomobility, Decreased activity tolerance, Decreased strength, Increased fascial restricitons, Impaired UE functional use, Pain, Difficulty walking, Decreased balance, Increased muscle spasms, Improper body mechanics, Dizziness, Decreased range of motion, Impaired flexibility, Postural dysfunction  Visit Diagnosis: Chronic pain of left knee  Chronic pain of right knee  Bilateral low back pain, unspecified chronicity, unspecified whether sciatica present  Muscle weakness (generalized)  Difficulty in walking, not elsewhere classified  Acute pain of right knee  Pain in right hand  Stiffness of right hand, not elsewhere classified     Problem List Patient Active Problem List   Diagnosis Date Noted   OSA on CPAP 02/19/2021    Bite, snake 01/28/2021   Neck pain 11/13/2020   Vertigo 11/13/2020   Hypertension 11/03/2020   Contusion of bone 10/23/2020   Degenerative tear of left medial meniscus 10/23/2020   Concussion with no loss of consciousness 10/23/2020   Paroxysmal atrial fibrillation ([facility] 05/22/2019   Unspecified atrial flutter ([facility] 05/22/2019   Pain in right foot 03/30/2018   Perennial and seasonal allergic rhinitis 11/09/2017   History of food allergy 11/09/2017   Wheezing 11/09/2017   Dyspnea/wheezing 10/31/2017   Cough 10/31/2017   Bronchitis, [place]opurulent recurrent ([facility] 09/07/2017   SVT (supraventricular tachycardia) ([facility]) 04/23/2015   Abdominal pain, epigastric 06/03/2013   Unspecified vitamin D deficiency 04/09/2013   Esophageal reflux 04/09/2013   Hepatitis B carrier ([facility] 04/09/2013   Other and unspecified hyperlipidemia 04/09/2013   Unspecified sleep apnea 04/09/2013    BArtist Pais PTA 03/25/2021, 5:59 PM  [facility] [place] [address]Phone: [phone]  Fax:  [phone] Name: Richard LAMMMRN: [id number] of Birth: [birth year]

## 2021-03-30 ENCOUNTER — Other Ambulatory Visit: Payer: Self-pay

## 2021-03-30 ENCOUNTER — Ambulatory Visit: Payer: Medicare Other | Admitting: Physical Therapy

## 2021-03-30 ENCOUNTER — Encounter: Payer: Self-pay | Admitting: Physical Therapy

## 2021-03-30 DIAGNOSIS — M25562 Pain in left knee: Secondary | ICD-10-CM | POA: Diagnosis not present

## 2021-03-30 DIAGNOSIS — G8929 Other chronic pain: Secondary | ICD-10-CM

## 2021-03-30 DIAGNOSIS — M25641 Stiffness of right hand, not elsewhere classified: Secondary | ICD-10-CM

## 2021-03-30 DIAGNOSIS — R262 Difficulty in walking, not elsewhere classified: Secondary | ICD-10-CM

## 2021-03-30 DIAGNOSIS — M545 Low back pain, unspecified: Secondary | ICD-10-CM

## 2021-03-30 DIAGNOSIS — M6281 Muscle weakness (generalized): Secondary | ICD-10-CM

## 2021-03-30 DIAGNOSIS — M79641 Pain in right hand: Secondary | ICD-10-CM

## 2021-03-30 DIAGNOSIS — M25561 Pain in right knee: Secondary | ICD-10-CM

## 2021-03-30 NOTE — Patient Instructions (Signed)
Access Code: [email or web address]  URL: [email or web address]  Date: 03/30/2021  Prepared by: Harrie Foreman    Exercises  Reverse Lunge - 1 x daily - 7 x weekly - 3 sets - 10 reps  Squat with Chair Touch - 1 x daily - 7 x weekly - 3 sets - 10 reps
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
## 2021-03-30 NOTE — Procedures (Signed)
    [facility]   HOME SLEEP TEST REPORT ( by Watch PAT)   STUDY DATE: 03-18-2021  DOB:  12/05/1955 MRN: [id number]   ORDERING CLINICIAN: Rhianna Raulerson,MD  REFERRING CLINICIAN:    CLINICAL INFORMATION/HISTORY: Richard Walker was seen on 19 February 2021 as an established patient of his obstructive sleep apnea on CPAP.  He has an extensive cardiac history.  He tends to be very fatigued and his sleepiness score was still endorsed high at 15 points.  He has been compliant 84% of the time and is using the machine on average 7 hours and 23 minutes at night.  95th percentile pressure is 7.5 cm his residual AHI is 1.0.  His CPAP is needing to be replaced as it is 65 years old.  In addition, I wonder if his fatigue is related to ongoing hypoxia.   Epworth sleepiness score: 15/24.  BMI: 36.6 kg/m  Neck Circumference: 19"    Sleep Summary:   Total Recording Time (hours, min): The total recording time for this home sleep test was 9 hours and 57 minutes of which 6 hours and 22 minutes were actual sleep time.  The percentage of REM sleep on the total sleep time was 10.6%.  This equaled 38 minutes.                                    Respiratory Indices:   Calculated pAHI (per hour): Apnea hypopnea index overall was 50.9/h (which is indicative of a very severe sleep apne.  The majority of events led to an oxygen desaturation of 4% or more.  The REM sleep dependent apnea-hypopnea index was only 14.9/h which is usually a sign that the patient has more central than obstructive sleep apnea.  His non-REM sleep AHI was 55.2/h..                           Supine AHI: Apnea-hypopnea index in supine position was 66.2/h and in nonsupine positions 33.5/h.   Snoring level and decibels was 43 dB the RDI was also very high at 55.7/h.                                                Oxygen Saturation Statistics:   O2 Saturation Range (%):    a minimum oxygenation of 85% and a maximum oxygen saturation of 99%  , placing the mean oxygen saturations at 95%.  The patient only experienced 0.2 minutes of total sleep time at or below 88% saturation.    Pulse Rate Statistics:       Pulse Range:   between 44 and 81bpm.  With a mean heart rate of 53 bpm.  No information as to the cardiac rhythm can be made.  Hypnograph:  This home sleep test confirmed very fragmented sleep by her milligram.  The patient seemed to sleep somewhat more consistently between 1 AM and 7:30 AM.  Only 2 phases of REM sleep were noted between 3 AM and 330 and another short time between 5.35 and 5.15.              IMPRESSION:  This HST confirms the presence of severe sleep apnea but the non-REM sleep dependent form of sleep   sleep apnea raises the index of suspicion for central apnea to be present.  The patient has a history of paroxysmal atrial fibrillation, SVT.    RECOMMENDATION: Since the patient has so far been doing rather well with CPAP I will renew his CPAP prescription for an auto titration device between 6 and 15 cmH2O, 3 cm EPR and a mask of his choice.  We did not find evidence of sleep hypoxia which could have contributed to his fatigue.  I would like for him to use heated humidification at the level he is comfortable with.    INTERPRETING PHYSICIAN:  Melvyn Novas, MD                                                      Medical Director of [facility].

## 2021-03-30 NOTE — Progress Notes (Signed)
IMPRESSION:  This home sleep test confirmed very fragmented sleep by hypnogram. This HST confirms the presence of severe sleep apnea but the non-REM sleep dependent form of sleep apnea raises the index of suspicion for central apnea to be present.  The patient has a history of paroxysmal atrial fibrillation, SVT.   RECOMMENDATION: Since the patient has so far been doing rather well with CPAP I will renew his CPAP prescription for an auto titration device between 6 and 15 cmH2O, 3 cm EPR and a mask of his choice.  We did not find evidence of sleep hypoxia which could have contributed to his fatigue.  I would like for him to use heated humidification at the level he is comfortable with.   INTERPRETING PHYSICIAN:  Melvyn Novas, MD

## 2021-03-30 NOTE — Addendum Note (Signed)
Addended by: Melvyn Novas on: 03/30/2021 04:53 PM   Modules accepted: Orders

## 2021-03-31 ENCOUNTER — Encounter: Payer: Self-pay | Admitting: Neurology

## 2021-03-31 ENCOUNTER — Telehealth: Payer: Self-pay | Admitting: Neurology

## 2021-03-31 NOTE — Progress Notes (Signed)
CM sent to [facility] 

## 2021-03-31 NOTE — Telephone Encounter (Signed)
I called pt. I advised pt that Dr. Vickey Huger reviewed their sleep study results and found that pt has severe OSA. Dr. Vickey Huger recommends that pt starts auto CPAP. I reviewed PAP compliance expectations with the pt. Pt is agreeable to starting a CPAP. I advised pt that an order will be sent to a DME, [facility], and [facility] will call the pt within about one week after they file with the pt's insurance. [facility] will show the pt how to use the machine, fit for masks, and troubleshoot the CPAP if needed. A follow up appt was made for insurance purposes with Butch Penny, NP on Nov 10,2022. Pt verbalized understanding to arrive 15 minutes early and bring their CPAP. A letter with all of this information in it will be mailed to the pt as a reminder. I verified with the pt that the address we have on file is correct. Pt verbalized understanding of results. Pt had no questions at this time but was encouraged to call back if questions arise. I have sent the order to [facility] and have received confirmation that they have received the order.

## 2021-03-31 NOTE — Telephone Encounter (Signed)
-----   Message from Melvyn Novas, MD sent at 03/30/2021  4:52 PM EDT ----- IMPRESSION:  This home sleep test confirmed very fragmented sleep by hypnogram. This HST confirms the presence of severe sleep apnea but the non-REM sleep dependent form of sleep apnea raises the index of suspicion for central apnea to be present.  The patient has a history of paroxysmal atrial fibrillation, SVT.   RECOMMENDATION: Since the patient has so far been doing rather well with CPAP I will renew his CPAP prescription for an auto titration device between 6 and 15 cmH2O, 3 cm EPR and a mask of his choice.  We did not find evidence of sleep hypoxia which could have contributed to his fatigue.  I would like for him to use heated humidification at the level he is comfortable with.   INTERPRETING PHYSICIAN:  Melvyn Novas, MD

## 2021-04-06 ENCOUNTER — Encounter: Payer: Self-pay | Admitting: Pulmonary Disease

## 2021-04-06 ENCOUNTER — Other Ambulatory Visit: Payer: Self-pay

## 2021-04-06 ENCOUNTER — Ambulatory Visit (INDEPENDENT_AMBULATORY_CARE_PROVIDER_SITE_OTHER): Payer: Medicare Other | Admitting: Pulmonary Disease

## 2021-04-06 DIAGNOSIS — R062 Wheezing: Secondary | ICD-10-CM

## 2021-04-06 DIAGNOSIS — R0609 Other forms of dyspnea: Secondary | ICD-10-CM

## 2021-04-06 DIAGNOSIS — J3089 Other allergic rhinitis: Secondary | ICD-10-CM | POA: Diagnosis not present

## 2021-04-06 MED ORDER — FLUTICASONE PROPIONATE 50 MCG/ACT NA SUSP: 1.0000 | Freq: Every day | NASAL | 3 refills | Status: DC

## 2021-04-06 MED ORDER — LEVOCETIRIZINE DIHYDROCHLORIDE 5 MG PO TABS: ORAL_TABLET | ORAL | 2 refills | Status: DC

## 2021-04-06 MED ORDER — BUDESONIDE-FORMOTEROL FUMARATE 160-4.5 MCG/ACT IN AERO: 2.0000 | INHALATION_SPRAY | Freq: Two times a day (BID) | RESPIRATORY_TRACT | 3 refills | Status: DC

## 2021-04-06 MED ORDER — ALBUTEROL SULFATE HFA 108 (90 BASE) MCG/ACT IN AERS: 1.0000 | INHALATION_SPRAY | Freq: Four times a day (QID) | RESPIRATORY_TRACT | 3 refills | Status: DC | PRN

## 2021-04-06 MED ORDER — AZELASTINE HCL 0.1 % NA SOLN: NASAL | 3 refills | Status: DC

## 2021-04-06 MED ORDER — ALCAFTADINE 0.25 % OP SOLN: 1.0000 [drp] | Freq: Every day | OPHTHALMIC | 5 refills | Status: DC | PRN

## 2021-04-06 MED ORDER — MONTELUKAST SODIUM 10 MG PO TABS: ORAL_TABLET | ORAL | 3 refills | Status: DC

## 2021-04-06 NOTE — Progress Notes (Signed)
[facility]  Chief Complaint  Patient presents with   Follow-up    Patient reports here for shortness of breath and it about the same.     Constitutional:  BP 118/76 (BP Location: Left Arm, Patient Position: Sitting, Cuff Size: Normal)   Pulse 71   Temp 97.9 F (36.6 C) (Oral)   Ht 5\' 9"  (1.753 m)   Wt 251 lb (113.9 kg)   SpO2 98%   BMI 37.07 kg/m   Past Medical History:  SVT, Atrial fibrillation/flutter, OSA  Past Surgical History:  His  has a past surgical history that includes Back surgery; Wisdom tooth extraction; Cardiac catheterization (N/A, 12/22/2015); Cardiac catheterization (N/A, 02/19/2016); Cardiac electrophysiology study and ablation; septal reconstruction (2017); Turbinate reduction (Bilateral, 07/2016); and Amputation (Left, 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  Brief Summary:  Richard Walker is a 65 y.o. male former smoker with dyspnea and cough.       Subjective:   Since his last visit he was bitten by a copperhead snake.    He had repeat sleep study with neurology and is waiting on new CPAP machine.  He ran out of symbicort two weeks ago.  Breathing has gotten worse.  Having sinus congestion with post nasal drip and itchy eyes.  Physical Exam:   Appearance - well kempt   ENMT - no sinus tenderness, no oral exudate, no LAN, Mallampati 3 airway, no stridor  Respiratory - equal breath sounds bilaterally, no wheezing or rales  CV - s1s2 regular rate and rhythm, no murmurs  Ext - no clubbing, no edema  Skin - no rashes  Psych - normal mood and affect     Pulmonary testing:  PFT 10/31/17 >> FEV1 2.29 64%), FEV1% 83, TLC 5.59 (79%), DLCO 77% IgE 05/28/20 >> 151 PFT 06/24/20 >> FEV1 2.86 (82%), FEV1% 83, TLC 3.98 (56%), DLCO 99%, +BD  Chest Imaging:  HRCT chest 10/21/17 >> atherosclerosis, scarring RML and lingula, hepatic steatosis HRCT chest 07/16/20 >> aortic atherosclerosis, calcified atherosclerotic plaque of Lt  main/LAD/LCX/RCA, small thin walled cyst in lateral segment of RML, scarring inferior segment of lingula, fatty liver, left renal cyst  Sleep Tests:  PSG 12/29/15 >> AHI 69.8, SpO2 low 89% ONO with CPAP 05/31/18 >> test time 8 hrs 54 min.  Baseline SpO2 95.9%, low SpO2 93%.  Cardiac Tests:  [facility] 12/22/15 >> mild, non obstructive CAD, normal LV fx CPST 05/24/18 >> submaximal effort, mild chronotropic incompetence Echo 10/31/18 >> EF 55 to 60% CPST 09/02/20 >> submaximal effort, deconditioning, mild restriction from body habitus, no clear cardiopulmonary limitation  Social History:  He  reports that he quit smoking about 30 years ago. His smoking use included cigarettes. He has a 10.00 pack-year smoking history. He has never used smokeless tobacco. He reports that he does not drink alcohol and does not use drugs.  Family History:  His family history includes Food Allergy in his son; Hypertension in his father.     Assessment/Plan:   Moderate persistent asthma with allergic profile. Perennial and seasonal allergic rhinitis. - previously seen by Dr. 10/31/20 with Asthma/Allergy (last in March 2019) - reaction to grass pollen, tree pollen, cockroach antigen, and dust mite antigen - has elevated IgE - refill symbicort - continue azelastine, fluticasone nasal spray, montelukast and xyzal - if symptoms progress, then he might be a candidate for xolair  Allergic conjunctivitis. - olopatadine was too expensive - will send script for alcaftadine 0.25% one drop daily prn  Restrictive lung  disease. - related to obesity and body habitus  Obstructive sleep apnea. - managed by Dr. Vickey Huger [facility]  Atrial fibrillation/flutter. - followed by Dr. Lewayne Bunting with [facility]  Time Spent Involved in Patient Care on Day of Examination:  33 minutes  Follow up:   Patient Instructions  Follow up in 6 months  Medication List:   Allergies as of 04/06/2021       Reactions    Pork-derived Products Swelling   Statins    Muscle aches   Tree Extract Other (See Comments)   Intolerance        Medication List        Accurate as of April 06, 2021 12:40 PM. If you have any questions, ask your nurse or doctor.          STOP taking these medications    HYDROcodone-acetaminophen 5-325 MG tablet Commonly known as: Norco Stopped by: Coralyn Helling, MD   Olopatadine HCl 0.2 % Soln Stopped by: Coralyn Helling, MD       TAKE these medications    acetaminophen 325 MG tablet Commonly known as: Tylenol Take 2 tablets (650 mg total) by mouth every 6 (six) hours.   albuterol 108 (90 Base) MCG/ACT inhaler Commonly known as: ProAir HFA Inhale 1-2 puffs into the lungs every 6 (six) hours as needed for wheezing or shortness of breath.   Alcaftadine 0.25 % Soln Apply 1 drop to eye daily as needed (Allergic eye irritation). Started by: Coralyn Helling, MD   azelastine 0.1 % nasal spray Commonly known as: ASTELIN 1-2 sprays per nostril 2 times daily as needed.   budesonide-formoterol 160-4.5 MCG/ACT inhaler Commonly known as: Symbicort Inhale 2 puffs into the lungs in the morning and at bedtime. Started by: Coralyn Helling, MD   cyclobenzaprine 10 MG tablet Commonly known as: FLEXERIL Take 1 tablet (10 mg total) by mouth 3 (three) times daily as needed.   diclofenac 75 MG EC tablet Commonly known as: VOLTAREN Take 1 tablet (75 mg total) by mouth 2 (two) times daily.   ezetimibe 10 MG tablet Commonly known as: ZETIA Take 1 tablet (10 mg total) by mouth daily.   famotidine-calcium carbonate-magnesium hydroxide 10-800-165 MG chewable tablet Commonly known as: PEPCID COMPLETE Chew by mouth.   fenofibrate 145 MG tablet Commonly known as: TRICOR Take 1 tablet by mouth daily.   flecainide 150 MG tablet Commonly known as: TAMBOCOR Take 1 tablet (150 mg total) by mouth 2 (two) times daily.   fluticasone 50 MCG/ACT nasal spray Commonly known as:  FLONASE Place 1 spray into both nostrils daily.   gabapentin 100 MG capsule Commonly known as: NEURONTIN Take 1 capsule (100 mg total) by mouth 3 (three) times daily.   ibuprofen 200 MG tablet Commonly known as: Advil Take 3 tablets (600 mg total) by mouth every 6 (six) hours.   levocetirizine 5 MG tablet Commonly known as: XYZAL TAKE 1 TABLET(5 MG) BY MOUTH EVERY EVENING   Magnesium 250 MG Tabs Take 1 tablet by mouth once a week.   meclizine 25 MG tablet Commonly known as: ANTIVERT Take 1 tablet (25 mg total) by mouth 3 (three) times daily as needed for dizziness.   meloxicam 15 MG tablet Commonly known as: MOBIC Take 1 tablet (15 mg total) by mouth daily.   metFORMIN 500 MG tablet Commonly known as: GLUCOPHAGE Take 1 tablet (500 mg total) by mouth 2 (two) times daily with a meal.   metoprolol tartrate 25 MG tablet  Commonly known as: LOPRESSOR Take one tablet by mouth twice a day.  May take one additional tablet by mouth as needed for breakthrough palpitations.   montelukast 10 MG tablet Commonly known as: SINGULAIR TAKE 1 TABLET BY MOUTH 1 TIME AT NIGHT FOR COUGHING OR WHEEZING   pantoprazole 40 MG tablet Commonly known as: PROTONIX Take 1 tablet (40 mg total) by mouth 2 (two) times daily.   PRESCRIPTION MEDICATION Inhale into the lungs at bedtime. CPAP   rivaroxaban 20 MG Tabs tablet Commonly known as: Xarelto Take 1 tablet (20 mg total) by mouth daily with supper.   sildenafil 20 MG tablet Commonly known as: REVATIO Take 1-2 tablets (20-40 mg total) by mouth as needed.   Vitamin D (Ergocalciferol) 1.25 MG (50000 UNIT) Caps capsule Commonly known as: DRISDOL Take 1 capsule (50,000 Units total) by mouth every 7 (seven) days.        Signature:  Coralyn Helling, MD [facility] Pager - [phone] 04/06/2021, 12:40 PM

## 2021-04-06 NOTE — Patient Instructions (Signed)
Follow up in 6 months 

## 2021-04-07 ENCOUNTER — Encounter: Payer: Self-pay | Admitting: Physical Therapy

## 2021-04-07 ENCOUNTER — Ambulatory Visit: Payer: Medicare Other | Admitting: Physical Therapy

## 2021-04-07 DIAGNOSIS — M25562 Pain in left knee: Secondary | ICD-10-CM | POA: Diagnosis not present

## 2021-04-07 DIAGNOSIS — R262 Difficulty in walking, not elsewhere classified: Secondary | ICD-10-CM

## 2021-04-07 DIAGNOSIS — M6281 Muscle weakness (generalized): Secondary | ICD-10-CM

## 2021-04-07 DIAGNOSIS — M545 Low back pain, unspecified: Secondary | ICD-10-CM

## 2021-04-07 DIAGNOSIS — G8929 Other chronic pain: Secondary | ICD-10-CM

## 2021-04-07 DIAGNOSIS — M25561 Pain in right knee: Secondary | ICD-10-CM

## 2021-04-07 NOTE — Therapy (Signed)
[facility] [address]  [place], [place], [address] Phone: [phone]   Fax:  [phone]  Physical Therapy Treatment  Patient Details  Name: Richard Walker MRN: [id number] Date of Birth: 27-Dec-1955 Referring Provider (PT): Clearance Coots, MD   Encounter Date: 04/07/2021   PT End of Session - 04/07/21 1524     Visit Number 32    Number of Visits 37    Date for PT Re-Evaluation 04/20/21    Authorization Type Medicare & Traditional Medicaid    PT Start Time 3903    PT Stop Time 1524    PT Time Calculation (min) 39 min    Activity Tolerance Patient tolerated treatment well    Behavior During Therapy [facility] for tasks assessed/performed             Past Medical History:  Diagnosis Date   DDD (degenerative disc disease), lumbar    L3/L4   Diverticulosis    GERD (gastroesophageal reflux disease)    Hyperlipidemia    Low sperm motility    Pre-diabetes    Sleep apnea    uses cpap    SVT (supraventricular tachycardia) ([place])     Past Surgical History:  Procedure Laterality Date   AMPUTATION Left 07/15/2018   Procedure: . REPAIR OF LEFT INDEX AND LONG FINGER BY FUSION /  REPAIR OF RING FINGER;  Surgeon: Milly Jakob, MD;  Location: [place];  Service: Orthopedics;  Laterality: Left;   BACK SURGERY     CARDIAC CATHETERIZATION N/A 12/22/2015   Procedure: Left Heart Cath and Coronary Angiography;  Surgeon: Peter M Martinique, MD;  Location: [facility];  Service: Cardiovascular;  Laterality: N/A;   CARDIAC ELECTROPHYSIOLOGY STUDY AND ABLATION     ELECTROPHYSIOLOGIC STUDY N/A 02/19/2016   Procedure: SVT Ablation;  Surgeon: Evans Lance, MD;  Location: [place];  Service: Cardiovascular;  Laterality: N/A;   septal reconstruction  2017   TURBINATE REDUCTION Bilateral 07/2016   WISDOM TOOTH EXTRACTION      There were no vitals filed for this visit.   Subjective Assessment - 04/07/21 1446      Subjective Patient reports today is a good day.  Just his knees still hurt, but he has been stretching and doing his HEP which he does feels helps.    Patient Stated Goals decrease knee pain, be able to kneel for prayer    Currently in Pain? Yes    Pain Score 3     Pain Location Knee    Pain Orientation Right;Left                               [facility] Adult PT Treatment/Exercise - 04/07/21 0001       Exercises   Exercises Knee/Hip      Knee/Hip Exercises: Stretches   Quad Stretch Right;Left;2 reps;30 seconds    Quad Stretch Limitations in standing with 1 UE support.      Knee/Hip Exercises: Aerobic   Recumbent Bike L3 x 6 min      Knee/Hip Exercises: Machines for Strengthening   Cybex Knee Flexion 35# x 10, 40# x 10   cues for eccentric control and breathing.   Cybex Leg Press 25# x 10, 30# x 10, 35# x 10      Knee/Hip Exercises: Standing   Forward Lunges Right;Left;2 sets;10 reps    Forward Lunges Limitations using  bolster for target    Functional Squat 2 sets;10 reps    Functional Squat Limitations cues for technique      Manual Therapy   Manual Therapy Soft tissue mobilization    Soft tissue mobilization IASTM with rolling stick to B guads, IASTM with s/s tools to bil MCL/pes anserine.                      PT Short Term Goals - 02/16/21 1501       PT SHORT TERM GOAL #1   Title Patient to be independent with initial HEP.    Time 3    Period Weeks    Status Achieved    Target Date 11/26/20               PT Long Term Goals - 03/30/21 1412       PT LONG TERM GOAL #1   Title Patient to be independent with advanced HEP.    Time 3    Period Weeks    Status Partially Met   progressed today   Target Date 04/20/21      PT LONG TERM GOAL #2   Title Patient to demonstrate L UE strength >/=4+/5.    Time 3    Period Weeks    Status Partially Met   still limited in R wrist, B grip   Target Date 04/20/21      PT LONG TERM GOAL  #3   Title Patient to demonstrate B LE strength >/=4+/5.    Time 6    Period Weeks    Status Achieved   met in hips and knees     PT LONG TERM GOAL #4   Title Patient to demonstrate cervical AROM WFL and without pain limiting.    Time 8    Period Weeks    Status Achieved   improved in flexion, extension, and R sidebending     PT LONG TERM GOAL #5   Title Patient to report tolerance for kneeling to pray d/t improvement in pain levels.    Time 3    Period Weeks    Status On-going   unable d/t B knee pain but stays in sitting position with LEs supported   Target Date 04/20/21      PT LONG TERM GOAL #6   Title Patient to report tolerance for 2 hours on his feet without pain limiting.    Time 3    Period Weeks    Status On-going   reports 15-20 min   Target Date 04/20/21      PT LONG TERM GOAL #7   Title Patient to report 90% improvement in dizziness.    Time 8    Period Weeks    Status Achieved   reports 90% improvement                  Plan - 04/07/21 1526     Clinical Impression Statement Patient continues to report only limiting factor is bil knee pain.  Focus of today's interventions was LE strengthening and stretching, noted significant improvement in form with lunges, cues still needed for correct performance of squats.  Educated on different weight machines, recommended against knee extension machine due to increased pressure on knees with OA, but hamstring curls and leg press were well tolerated.  End of session STM to bil knees with s/s tools to MCL/pes anserine, reported decreased pain.  Pt. would benefit from continued skilled therapy.  Personal Factors and Comorbidities Age;Comorbidity 1;Fitness;Past/Current Experience;Time since onset of injury/illness/exacerbation    Comorbidities back surgery L3-4 1997    PT Frequency 2x / week    PT Duration 6 weeks    PT Treatment/Interventions ADLs/Self Care Home Management;Canalith Repostioning;Cryotherapy;Electrical  Stimulation;Iontophoresis 64m/ml Dexamethasone;Moist Heat;Traction;Balance training;Therapeutic exercise;Therapeutic activities;Functional mobility training;Stair training;Gait training;Ultrasound;Neuromuscular re-education;Patient/family education;Manual techniques;Vestibular;Vasopneumatic Device;Taping;Energy conservation;Dry needling;Passive range of motion;Scar mobilization    PT Next Visit Plan continue functional strengthening, dry needling to quads?    Consulted and Agree with Plan of Care Patient             Patient will benefit from skilled therapeutic intervention in order to improve the following deficits and impairments:  Abnormal gait, Hypomobility, Decreased activity tolerance, Decreased strength, Increased fascial restricitons, Impaired UE functional use, Pain, Difficulty walking, Decreased balance, Increased muscle spasms, Improper body mechanics, Dizziness, Decreased range of motion, Impaired flexibility, Postural dysfunction  Visit Diagnosis: Chronic pain of left knee  Chronic pain of right knee  Bilateral low back pain, unspecified chronicity, unspecified whether sciatica present  Muscle weakness (generalized)  Difficulty in walking, not elsewhere classified     Problem List Patient Active Problem List   Diagnosis Date Noted   OSA on CPAP 02/19/2021   Bite, snake 01/28/2021   Neck pain 11/13/2020   Vertigo 11/13/2020   Hypertension 11/03/2020   Contusion of bone 10/23/2020   Degenerative tear of left medial meniscus 10/23/2020   Concussion with no loss of consciousness 10/23/2020   Paroxysmal atrial fibrillation ([place] 05/22/2019   Unspecified atrial flutter ([place] 05/22/2019   Pain in right foot 03/30/2018   Perennial and seasonal allergic rhinitis 11/09/2017   History of food allergy 11/09/2017   Wheezing 11/09/2017   Dyspnea/wheezing 10/31/2017   Cough 10/31/2017   Bronchitis, mucopurulent recurrent ([place] 09/07/2017   SVT (supraventricular  tachycardia) ([place] 04/23/2015   Abdominal pain, epigastric 06/03/2013   Unspecified vitamin D deficiency 04/09/2013   Esophageal reflux 04/09/2013   Hepatitis B carrier ([place] 04/09/2013   Other and unspecified hyperlipidemia 04/09/2013   Unspecified sleep apnea 04/09/2013    ERennie NatterPT, DPT 04/07/2021, 3:30 PM  [facility] [address] [place] [address]Phone: [phone]  Fax:  [phone] Name: Richard MCGLORYMRN: [id number] of Birth: [birth year]

## 2021-04-08 ENCOUNTER — Other Ambulatory Visit (INDEPENDENT_AMBULATORY_CARE_PROVIDER_SITE_OTHER): Payer: Medicare Other

## 2021-04-08 ENCOUNTER — Other Ambulatory Visit: Payer: Self-pay

## 2021-04-08 DIAGNOSIS — R739 Hyperglycemia, unspecified: Secondary | ICD-10-CM

## 2021-04-08 DIAGNOSIS — E785 Hyperlipidemia, unspecified: Secondary | ICD-10-CM | POA: Diagnosis not present

## 2021-04-08 DIAGNOSIS — I10 Essential (primary) hypertension: Secondary | ICD-10-CM | POA: Diagnosis not present

## 2021-04-08 LAB — HEMOGLOBIN A1C: Hgb A1c MFr Bld: 5.8 % (ref 4.6–6.5)

## 2021-04-08 LAB — LIPID PANEL
Cholesterol: 190 mg/dL (ref 0–200)
HDL: 39.7 mg/dL (ref >39.00)
LDL Cholesterol: 120 mg/dL — ABNORMAL HIGH (ref 0–99)
NonHDL: 150.78
Total CHOL/HDL Ratio: 5
Triglycerides: 152 mg/dL — ABNORMAL HIGH (ref 0.0–149.0)
VLDL: 30.4 mg/dL (ref 0.0–40.0)

## 2021-04-08 LAB — COMPREHENSIVE METABOLIC PANEL
ALT: 15 U/L (ref 0–53)
AST: 16 U/L (ref 0–37)
Albumin: 4 g/dL (ref 3.5–5.2)
Alkaline Phosphatase: 60 U/L (ref 39–117)
BUN: 18 mg/dL (ref 6–23)
CO2: 26 mEq/L (ref 19–32)
Calcium: 9.6 mg/dL (ref 8.4–10.5)
Chloride: 102 mEq/L (ref 96–112)
Creatinine, Ser: 1.31 mg/dL (ref 0.40–1.50)
GFR: 57.4 mL/min — ABNORMAL LOW (ref >60.00)
Glucose, Bld: 88 mg/dL (ref 70–99)
Potassium: 4.2 mEq/L (ref 3.5–5.1)
Sodium: 138 mEq/L (ref 135–145)
Total Bilirubin: 0.6 mg/dL (ref 0.2–1.2)
Total Protein: 6.6 g/dL (ref 6.0–8.3)

## 2021-04-10 ENCOUNTER — Ambulatory Visit: Payer: Medicare Other

## 2021-04-10 ENCOUNTER — Other Ambulatory Visit: Payer: Self-pay

## 2021-04-10 DIAGNOSIS — G8929 Other chronic pain: Secondary | ICD-10-CM

## 2021-04-10 DIAGNOSIS — M25562 Pain in left knee: Secondary | ICD-10-CM | POA: Diagnosis not present

## 2021-04-10 DIAGNOSIS — M545 Low back pain, unspecified: Secondary | ICD-10-CM

## 2021-04-10 DIAGNOSIS — M25641 Stiffness of right hand, not elsewhere classified: Secondary | ICD-10-CM

## 2021-04-10 DIAGNOSIS — M25561 Pain in right knee: Secondary | ICD-10-CM

## 2021-04-10 DIAGNOSIS — M79641 Pain in right hand: Secondary | ICD-10-CM

## 2021-04-10 DIAGNOSIS — M6281 Muscle weakness (generalized): Secondary | ICD-10-CM

## 2021-04-10 DIAGNOSIS — R262 Difficulty in walking, not elsewhere classified: Secondary | ICD-10-CM

## 2021-04-10 NOTE — Therapy (Signed)
[facility] [address]  [place], [place], [address] Phone: [phone]   Fax:  [phone]  Physical Therapy Treatment  Patient Details  Name: Richard Walker MRN: [id number] Date of Birth: 1955-11-18 Referring Provider (PT): Clearance Coots, MD   Encounter Date: 04/10/2021   PT End of Session - 04/10/21 0843     Visit Number 33    Number of Visits 37    Date for PT Re-Evaluation 04/20/21    Authorization Type Medicare & Traditional Medicaid    PT Start Time 0803    PT Stop Time 0843    PT Time Calculation (min) 40 min    Activity Tolerance Patient tolerated treatment well    Behavior During Therapy [facility] for tasks assessed/performed             Past Medical History:  Diagnosis Date   DDD (degenerative disc disease), lumbar    L3/L4   Diverticulosis    GERD (gastroesophageal reflux disease)    Hyperlipidemia    Low sperm motility    Pre-diabetes    Sleep apnea    uses cpap    SVT (supraventricular tachycardia) [facility])     Past Surgical History:  Procedure Laterality Date   AMPUTATION Left 07/15/2018   Procedure: . REPAIR OF LEFT INDEX AND LONG FINGER BY FUSION /  REPAIR OF RING FINGER;  Surgeon: Milly Jakob, MD;  Location: [facility];  Service: Orthopedics;  Laterality: Left;   BACK SURGERY     CARDIAC CATHETERIZATION N/A 12/22/2015   Procedure: Left Heart Cath and Coronary Angiography;  Surgeon: Peter M Martinique, MD;  Location: [facility];  Service: Cardiovascular;  Laterality: N/A;   CARDIAC ELECTROPHYSIOLOGY STUDY AND ABLATION     ELECTROPHYSIOLOGIC STUDY N/A 02/19/2016   Procedure: SVT Ablation;  Surgeon: Evans Lance, MD;  Location: [place] [facility];  Service: Cardiovascular;  Laterality: N/A;   septal reconstruction  2017   TURBINATE REDUCTION Bilateral 07/2016   WISDOM TOOTH EXTRACTION      There were no vitals filed for this visit.   Subjective Assessment - 04/10/21 0805      Subjective Pt reports that all the stretches and exercises are going well for him.    Pertinent History none    Diagnostic tests Per MD- "Independent review of the CT lumbar spine from 2/15 shows stable interbody fusion at L4/5 and degenerative disc disease at L5-S1.    Independent review of the CT cervical spine 2/15 shows severe degenerative change at C5-6.    Independent review the B knee x-ray from 2/15 shows no acute changes."    Patient Stated Goals decrease knee pain, be able to kneel for prayer    Currently in Pain? Yes    Pain Score 3     Pain Location Knee    Pain Orientation Right;Left    Pain Descriptors / Indicators Aching    Pain Type Chronic pain                               [facility] Adult PT Treatment/Exercise - 04/10/21 0001       Knee/Hip Exercises: Aerobic   Recumbent Bike L4x26mn      Knee/Hip Exercises: Machines for Strengthening   Cybex Knee Flexion 15# R/L 10 reps each    Cybex Leg Press 20# R/L 10 reps each    Other Machine R/L hip  extension and hs curls with pully machine 10 reps each; 5#      Knee/Hip Exercises: Standing   Terminal Knee Extension Strengthening;Right;Left;1 set;10 reps    Theraband Level (Terminal Knee Extension) Level 4 (Blue)    Terminal Knee Extension Limitations 1 UE support    Other Standing Knee Exercises monster walks fwd and back with blue TB at ankles 3x20 ft      Knee/Hip Exercises: Seated   Hamstring Curl Strengthening;Both;10 reps    Hamstring Limitations blue TB    Sit to Sand 2 sets;10 reps;without UE support   with blue weight ball and blue TB at knees to incorporate more glutes                     PT Short Term Goals - 02/16/21 1501       PT SHORT TERM GOAL #1   Title Patient to be independent with initial HEP.    Time 3    Period Weeks    Status Achieved    Target Date 11/26/20               PT Long Term Goals - 03/30/21 1412       PT LONG TERM GOAL #1   Title Patient to  be independent with advanced HEP.    Time 3    Period Weeks    Status Partially Met   progressed today   Target Date 04/20/21      PT LONG TERM GOAL #2   Title Patient to demonstrate L UE strength >/=4+/5.    Time 3    Period Weeks    Status Partially Met   still limited in R wrist, B grip   Target Date 04/20/21      PT LONG TERM GOAL #3   Title Patient to demonstrate B LE strength >/=4+/5.    Time 6    Period Weeks    Status Achieved   met in hips and knees     PT LONG TERM GOAL #4   Title Patient to demonstrate cervical AROM WFL and without pain limiting.    Time 8    Period Weeks    Status Achieved   improved in flexion, extension, and R sidebending     PT LONG TERM GOAL #5   Title Patient to report tolerance for kneeling to pray d/t improvement in pain levels.    Time 3    Period Weeks    Status On-going   unable d/t B knee pain but stays in sitting position with LEs supported   Target Date 04/20/21      PT LONG TERM GOAL #6   Title Patient to report tolerance for 2 hours on his feet without pain limiting.    Time 3    Period Weeks    Status On-going   reports 15-20 min   Target Date 04/20/21      PT LONG TERM GOAL #7   Title Patient to report 90% improvement in dizziness.    Time 8    Period Weeks    Status Achieved   reports 90% improvement                  Plan - 04/10/21 0844     Clinical Impression Statement Pt continued to report difficulty with kneeling to pray but a little easier since we have been practicing lunges. Progressed treatment focusing on hs strengthening and glute muscle recruitment. Cues required to  target the glutes during monster walks. Also cues for eccentric control with STS. Pt demonstrated a good performance of the exercises and gave intermittent cues as needed. He is making small progress with being able to kneel to pray.    Personal Factors and Comorbidities Age;Comorbidity 1;Fitness;Past/Current Experience;Time since onset  of injury/illness/exacerbation    Comorbidities back surgery L3-4 1997    PT Frequency 2x / week    PT Duration 6 weeks    PT Treatment/Interventions ADLs/Self Care Home Management;Canalith Repostioning;Cryotherapy;Electrical Stimulation;Iontophoresis 25m/ml Dexamethasone;Moist Heat;Traction;Balance training;Therapeutic exercise;Therapeutic activities;Functional mobility training;Stair training;Gait training;Ultrasound;Neuromuscular re-education;Patient/family education;Manual techniques;Vestibular;Vasopneumatic Device;Taping;Energy conservation;Dry needling;Passive range of motion;Scar mobilization    PT Next Visit Plan continue functional strengthening, dry needling to quads?    Consulted and Agree with Plan of Care Patient             Patient will benefit from skilled therapeutic intervention in order to improve the following deficits and impairments:  Abnormal gait, Hypomobility, Decreased activity tolerance, Decreased strength, Increased fascial restricitons, Impaired UE functional use, Pain, Difficulty walking, Decreased balance, Increased muscle spasms, Improper body mechanics, Dizziness, Decreased range of motion, Impaired flexibility, Postural dysfunction  Visit Diagnosis: Chronic pain of left knee  Chronic pain of right knee  Bilateral low back pain, unspecified chronicity, unspecified whether sciatica present  Muscle weakness (generalized)  Difficulty in walking, not elsewhere classified  Acute pain of right knee  Pain in right hand  Stiffness of right hand, not elsewhere classified     Problem List Patient Active Problem List   Diagnosis Date Noted   OSA on CPAP 02/19/2021   Bite, snake 01/28/2021   Neck pain 11/13/2020   Vertigo 11/13/2020   Hypertension 11/03/2020   Contusion of bone 10/23/2020   Degenerative tear of left medial meniscus 10/23/2020   Concussion with no loss of consciousness 10/23/2020   Paroxysmal atrial fibrillation ([facility] 05/22/2019    Unspecified atrial flutter ([facility] 05/22/2019   Pain in right foot 03/30/2018   Perennial and seasonal allergic rhinitis 11/09/2017   History of food allergy 11/09/2017   Wheezing 11/09/2017   Dyspnea/wheezing 10/31/2017   Cough 10/31/2017   Bronchitis, mucopurulent recurrent ([place] 09/07/2017   SVT (supraventricular tachycardia) ([place] 04/23/2015   Abdominal pain, epigastric 06/03/2013   Unspecified vitamin D deficiency 04/09/2013   Esophageal reflux 04/09/2013   Hepatitis B carrier ([facility] 04/09/2013   Other and unspecified hyperlipidemia 04/09/2013   Unspecified sleep apnea 04/09/2013    BArtist Pais PTA 04/10/2021, 8:51 AM  [facility] [place] [address]Phone: [phone]  Fax:  [phone] Name: Richard MANUSMRN: [id number] of Birth: 1Nov 29, 1957

## 2021-04-13 ENCOUNTER — Encounter: Payer: Self-pay | Admitting: Neurology

## 2021-04-14 ENCOUNTER — Other Ambulatory Visit: Payer: Self-pay

## 2021-04-14 ENCOUNTER — Ambulatory Visit: Payer: Medicare Other

## 2021-04-14 DIAGNOSIS — G8929 Other chronic pain: Secondary | ICD-10-CM

## 2021-04-14 DIAGNOSIS — M25562 Pain in left knee: Secondary | ICD-10-CM | POA: Diagnosis not present

## 2021-04-14 DIAGNOSIS — M79641 Pain in right hand: Secondary | ICD-10-CM

## 2021-04-14 DIAGNOSIS — M25561 Pain in right knee: Secondary | ICD-10-CM

## 2021-04-14 DIAGNOSIS — M545 Low back pain, unspecified: Secondary | ICD-10-CM

## 2021-04-14 DIAGNOSIS — M6281 Muscle weakness (generalized): Secondary | ICD-10-CM

## 2021-04-14 DIAGNOSIS — R262 Difficulty in walking, not elsewhere classified: Secondary | ICD-10-CM

## 2021-04-14 DIAGNOSIS — M25641 Stiffness of right hand, not elsewhere classified: Secondary | ICD-10-CM

## 2021-04-14 NOTE — Patient Instructions (Signed)
Access Code: [email or web address] URL: [email or web address] Date: 04/14/2021 Prepared by: Verta Ellen  Exercises Standing Hamstring Curl with Resistance - 2 x daily - 7 x weekly - 2 sets - 10 reps Forward Lunge with Back Leg Straight and Counter Support - 2 x daily - 7 x weekly - 2 sets - 10 reps Prone Quadriceps Stretch with Strap - 2 x daily - 7 x weekly - 2 sets - 2 reps - 30 sec hold Supine Quadriceps Stretch with Strap on Table - 2 x daily - 7 x weekly - 2 sets - 10 reps Squat with Chair Touch - 2 x daily - 7 x weekly - 2 sets - 10 reps Prone Hip Extension with Bent Knee - 2 x daily - 7 x weekly - 2 sets - 10 reps

## 2021-04-14 NOTE — Therapy (Signed)
[facility] [address]  [place], [place], [address] Phone: [phone]   Fax:  [phone]  Physical Therapy Treatment  Patient Details  Name: Richard Walker MRN: [id number] Date of Birth: 1956-02-27 Referring Provider (PT): Clearance Coots, MD   Encounter Date: 04/14/2021   PT End of Session - 04/14/21 1359     Visit Number 34    Number of Visits 95    Date for PT Re-Evaluation 04/20/21    Authorization Type Medicare & Traditional Medicaid    PT Start Time 1315    PT Stop Time 1275    PT Time Calculation (min) 44 min    Activity Tolerance Patient tolerated treatment well    Behavior During Therapy [facility] for tasks assessed/performed             Past Medical History:  Diagnosis Date   DDD (degenerative disc disease), lumbar    L3/L4   Diverticulosis    GERD (gastroesophageal reflux disease)    Hyperlipidemia    Low sperm motility    Pre-diabetes    Sleep apnea    uses cpap    SVT (supraventricular tachycardia) ([place])     Past Surgical History:  Procedure Laterality Date   AMPUTATION Left 07/15/2018   Procedure: . REPAIR OF LEFT INDEX AND LONG FINGER BY FUSION /  REPAIR OF RING FINGER;  Surgeon: Milly Jakob, MD;  Location: [place];  Service: Orthopedics;  Laterality: Left;   BACK SURGERY     CARDIAC CATHETERIZATION N/A 12/22/2015   Procedure: Left Heart Cath and Coronary Angiography;  Surgeon: Peter M Martinique, MD;  Location: [place] [facility];  Service: Cardiovascular;  Laterality: N/A;   CARDIAC ELECTROPHYSIOLOGY STUDY AND ABLATION     ELECTROPHYSIOLOGIC STUDY N/A 02/19/2016   Procedure: SVT Ablation;  Surgeon: Evans Lance, MD;  Location: [place] [facility];  Service: Cardiovascular;  Laterality: N/A;   septal reconstruction  2017   TURBINATE REDUCTION Bilateral 07/2016   WISDOM TOOTH EXTRACTION      There were no vitals filed for this visit.   Subjective Assessment - 04/14/21 1318      Subjective Still reports improvement from hip flexor stretches with strap, also continued pain with bending down.    Pertinent History none    Diagnostic tests Per MD- "Independent review of the CT lumbar spine from 2/15 shows stable interbody fusion at L4/5 and degenerative disc disease at L5-S1.    Independent review of the CT cervical spine 2/15 shows severe degenerative change at C5-6.    Independent review the B knee x-ray from 2/15 shows no acute changes."    Patient Stated Goals decrease knee pain, be able to kneel for prayer    Currently in Pain? Yes    Pain Score 3     Pain Location Knee    Pain Orientation Right;Left    Pain Descriptors / Indicators Aching    Pain Type Chronic pain                               [facility] Adult PT Treatment/Exercise - 04/14/21 0001       Knee/Hip Exercises: Aerobic   Recumbent Bike L4x24mn      Knee/Hip Exercises: Machines for Strengthening   Cybex Leg Press 20# R/L 2x10 reps each      Knee/Hip Exercises: Standing   Knee Flexion Strengthening;Both;10 reps  Knee Flexion Limitations 3# at counter support    Forward Lunges Right;Left;10 reps    Forward Lunges Limitations knee down to bosu ball      Knee/Hip Exercises: Seated   Sit to Sand 10 reps;without UE support   bottom touch to mat; cues to increase fwd WS to reduce stress on knees     Knee/Hip Exercises: Prone   Hamstring Curl 10 reps    Hamstring Curl Limitations 3# weight    Hip Extension Strengthening;Both;10 reps    Hip Extension Limitations knee bent                    PT Education - 04/14/21 1355     Education Details HEP update: Access Code: [email or web address]    Person(s) Educated Patient    Methods Explanation;Demonstration;Verbal cues;Handout    Comprehension Verbalized understanding;Returned demonstration;Verbal cues required;Need further instruction              PT Short Term Goals - 02/16/21 1501       PT SHORT TERM GOAL #1   Title  Patient to be independent with initial HEP.    Time 3    Period Weeks    Status Achieved    Target Date 11/26/20               PT Long Term Goals - 03/30/21 1412       PT LONG TERM GOAL #1   Title Patient to be independent with advanced HEP.    Time 3    Period Weeks    Status Partially Met   progressed today   Target Date 04/20/21      PT LONG TERM GOAL #2   Title Patient to demonstrate L UE strength >/=4+/5.    Time 3    Period Weeks    Status Partially Met   still limited in R wrist, B grip   Target Date 04/20/21      PT LONG TERM GOAL #3   Title Patient to demonstrate B LE strength >/=4+/5.    Time 6    Period Weeks    Status Achieved   met in hips and knees     PT LONG TERM GOAL #4   Title Patient to demonstrate cervical AROM WFL and without pain limiting.    Time 8    Period Weeks    Status Achieved   improved in flexion, extension, and R sidebending     PT LONG TERM GOAL #5   Title Patient to report tolerance for kneeling to pray d/t improvement in pain levels.    Time 3    Period Weeks    Status On-going   unable d/t B knee pain but stays in sitting position with LEs supported   Target Date 04/20/21      PT LONG TERM GOAL #6   Title Patient to report tolerance for 2 hours on his feet without pain limiting.    Time 3    Period Weeks    Status On-going   reports 15-20 min   Target Date 04/20/21      PT LONG TERM GOAL #7   Title Patient to report 90% improvement in dizziness.    Time 8    Period Weeks    Status Achieved   reports 90% improvement                  Plan - 04/14/21 1359     Clinical Impression Statement Pt  reports steady improvements with B knee pain. Noticed that he had increased instability with fwd lunges leading with R LE. We were able to increase depth with lunges utilizing the bosu ball. Progressed HEP with hamstring and glute strengthening exercises to improve knee stability and decrease fwd translation of tibia with  squatting. Cues required with standing hs curls to keep hip extended to place more tension on hamstrings. He shows increased tolerance to squatting with increased depth w/o knee pain.    Personal Factors and Comorbidities Age;Comorbidity 1;Fitness;Past/Current Experience;Time since onset of injury/illness/exacerbation    Comorbidities back surgery L3-4 1997    PT Frequency 2x / week    PT Duration 6 weeks    PT Treatment/Interventions ADLs/Self Care Home Management;Canalith Repostioning;Cryotherapy;Electrical Stimulation;Iontophoresis 73m/ml Dexamethasone;Moist Heat;Traction;Balance training;Therapeutic exercise;Therapeutic activities;Functional mobility training;Stair training;Gait training;Ultrasound;Neuromuscular re-education;Patient/family education;Manual techniques;Vestibular;Vasopneumatic Device;Taping;Energy conservation;Dry needling;Passive range of motion;Scar mobilization    PT Next Visit Plan continue functional strengthening, dry needling to quads?    Consulted and Agree with Plan of Care Patient             Patient will benefit from skilled therapeutic intervention in order to improve the following deficits and impairments:  Abnormal gait, Hypomobility, Decreased activity tolerance, Decreased strength, Increased fascial restricitons, Impaired UE functional use, Pain, Difficulty walking, Decreased balance, Increased muscle spasms, Improper body mechanics, Dizziness, Decreased range of motion, Impaired flexibility, Postural dysfunction  Visit Diagnosis: Chronic pain of left knee  Chronic pain of right knee  Bilateral low back pain, unspecified chronicity, unspecified whether sciatica present  Muscle weakness (generalized)  Difficulty in walking, not elsewhere classified  Acute pain of right knee  Pain in right hand  Stiffness of right hand, not elsewhere classified     Problem List Patient Active Problem List   Diagnosis Date Noted   OSA on CPAP 02/19/2021   Bite,  snake 01/28/2021   Neck pain 11/13/2020   Vertigo 11/13/2020   Hypertension 11/03/2020   Contusion of bone 10/23/2020   Degenerative tear of left medial meniscus 10/23/2020   Concussion with no loss of consciousness 10/23/2020   Paroxysmal atrial fibrillation ([facility] 05/22/2019   Unspecified atrial flutter ([facility] 05/22/2019   Pain in right foot 03/30/2018   Perennial and seasonal allergic rhinitis 11/09/2017   History of food allergy 11/09/2017   Wheezing 11/09/2017   Dyspnea/wheezing 10/31/2017   Cough 10/31/2017   Bronchitis, mucopurulent recurrent ([facility] 09/07/2017   SVT (supraventricular tachycardia) ([facility] 04/23/2015   Abdominal pain, epigastric 06/03/2013   Unspecified vitamin D deficiency 04/09/2013   Esophageal reflux 04/09/2013   Hepatitis B carrier ([facility] 04/09/2013   Other and unspecified hyperlipidemia 04/09/2013   Unspecified sleep apnea 04/09/2013    BArtist Pais PTA 04/14/2021, 3:14 PM  [facility] [place] [address]Phone: [phone]  Fax:  [phone] Name: SMIA WINTHROPMRN: [id number] of Birth: 1May 15, 1957

## 2021-04-15 ENCOUNTER — Ambulatory Visit (INDEPENDENT_AMBULATORY_CARE_PROVIDER_SITE_OTHER): Payer: Medicare Other | Admitting: Medical

## 2021-04-15 ENCOUNTER — Ambulatory Visit: Payer: Medicare Other

## 2021-04-15 VITALS — BP 122/74 | HR 66 | Temp 98.2°F | Resp 18 | Ht 66.0 in | Wt 253.2 lb

## 2021-04-15 DIAGNOSIS — M25562 Pain in left knee: Secondary | ICD-10-CM | POA: Diagnosis not present

## 2021-04-15 DIAGNOSIS — Z125 Encounter for screening for malignant neoplasm of prostate: Secondary | ICD-10-CM | POA: Diagnosis not present

## 2021-04-15 DIAGNOSIS — R262 Difficulty in walking, not elsewhere classified: Secondary | ICD-10-CM

## 2021-04-15 DIAGNOSIS — R350 Frequency of micturition: Secondary | ICD-10-CM

## 2021-04-15 DIAGNOSIS — I10 Essential (primary) hypertension: Secondary | ICD-10-CM | POA: Diagnosis not present

## 2021-04-15 DIAGNOSIS — M25641 Stiffness of right hand, not elsewhere classified: Secondary | ICD-10-CM

## 2021-04-15 DIAGNOSIS — M6281 Muscle weakness (generalized): Secondary | ICD-10-CM

## 2021-04-15 DIAGNOSIS — I480 Paroxysmal atrial fibrillation: Secondary | ICD-10-CM | POA: Diagnosis not present

## 2021-04-15 DIAGNOSIS — K219 Gastro-esophageal reflux disease without esophagitis: Secondary | ICD-10-CM | POA: Diagnosis not present

## 2021-04-15 DIAGNOSIS — M545 Low back pain, unspecified: Secondary | ICD-10-CM

## 2021-04-15 DIAGNOSIS — M25561 Pain in right knee: Secondary | ICD-10-CM

## 2021-04-15 DIAGNOSIS — E785 Hyperlipidemia, unspecified: Secondary | ICD-10-CM | POA: Diagnosis not present

## 2021-04-15 DIAGNOSIS — R5383 Other fatigue: Secondary | ICD-10-CM

## 2021-04-15 DIAGNOSIS — M79641 Pain in right hand: Secondary | ICD-10-CM

## 2021-04-15 DIAGNOSIS — E559 Vitamin D deficiency, unspecified: Secondary | ICD-10-CM | POA: Diagnosis not present

## 2021-04-15 DIAGNOSIS — H538 Other visual disturbances: Secondary | ICD-10-CM

## 2021-04-15 DIAGNOSIS — E538 Deficiency of other specified B group vitamins: Secondary | ICD-10-CM | POA: Diagnosis not present

## 2021-04-15 DIAGNOSIS — G8929 Other chronic pain: Secondary | ICD-10-CM

## 2021-04-15 MED ORDER — SILDENAFIL CITRATE 20 MG PO TABS: 20.0000 mg | ORAL_TABLET | ORAL | 1 refills | Status: DC | PRN

## 2021-04-15 MED ORDER — PANTOPRAZOLE SODIUM 40 MG PO TBEC: 40.0000 mg | DELAYED_RELEASE_TABLET | Freq: Two times a day (BID) | ORAL | 3 refills | Status: DC

## 2021-04-15 MED ORDER — METFORMIN HCL 500 MG PO TABS: 500.0000 mg | ORAL_TABLET | Freq: Two times a day (BID) | ORAL | 3 refills | Status: DC

## 2021-04-15 NOTE — Progress Notes (Signed)
Subjective:    Patient ID: Richard Walker, male    DOB: 04-03-1956, 65 y.o.   MRN: [id number]  HPI  Pt in for follow up.  Hx of htn, high cholesterol, atrial fibrillation and mild elevated sugar.  Recent cholesterol levels mild elevated. Pt is on zetia. Also on fenofibrate.  For elevated sugar pt is on metformin. Prediabetes range in past.  Hx of atrial fibrillation. Pt is on lopressor and flecainaide. Pt also on xarelto.  Hx of ED and pt got revatio. Rx'd by Dr Ladona Ridgel.  Pt has gerd and doing well with protonix.   Intermittent episodes of blurred vision. 3-4 days out of the week.  Also reports fatigue.  Occasional he gets bp less than 100 at home.  Pt indicates in past hx of low b12 and vit d.   Review of Systems  Constitutional:  Negative for chills, fatigue and fever.  HENT:  Negative for congestion.   Respiratory:  Negative for cough, chest tightness, shortness of breath and wheezing.   Cardiovascular:  Negative for chest pain and palpitations.  Gastrointestinal:  Negative for abdominal pain and blood in stool.  Genitourinary:        ED.  Musculoskeletal:  Negative for back pain.  Skin:  Negative for rash.  Neurological:  Negative for dizziness, seizures and light-headedness.  Hematological:  Negative for adenopathy. Does not bruise/bleed easily.  Psychiatric/Behavioral:  Negative for behavioral problems and decreased concentration.     Past Medical History:  Diagnosis Date   DDD (degenerative disc disease), lumbar    L3/L4   Diverticulosis    GERD (gastroesophageal reflux disease)    Hyperlipidemia    Low sperm motility    Pre-diabetes    Sleep apnea    uses cpap    SVT (supraventricular tachycardia) ([facility])      Social History   Socioeconomic History   Marital status: Married    Spouse name: Not on file   Number of children: Not on file   Years of education: Not on file   Highest education level: Not on file  Occupational History   Not on file   Tobacco Use   Smoking status: Former    Packs/day: 1.00    Years: 10.00    Pack years: 10.00    Types: Cigarettes    Quit date: 72    Years since quitting: 30.6   Smokeless tobacco: Never   Tobacco comments:    Uses Hookah Pipe occasionally  Vaping Use   Vaping Use: Never used  Substance and Sexual Activity   Alcohol use: No   Drug use: No   Sexual activity: Yes  Other Topics Concern   Not on file  Social History Narrative   Marital Status: Married Engineer, drilling)   Children:  4 (3 Sons/1 Daughter)   Pets:  None    Living Situation: Lives with spouse and 4 children   Origin:  He was born in [place].   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     Alcohol Use:  None   Drug Use:  None   Diet:  Regular   Exercise:  None   Hobbies:  Cards   Social Determinants of Health   Financial Resource Strain: Not on file  Food Insecurity: Not on file  Transportation Needs: Not on file  Physical Activity: Not on file  Stress: Not on file  Social  Connections: Not on file  Intimate Partner Violence: Not on file    Past Surgical History:  Procedure Laterality Date   AMPUTATION Left 07/15/2018   Procedure: . REPAIR OF LEFT INDEX AND LONG FINGER BY FUSION /  REPAIR OF RING FINGER;  Surgeon: Mack Hook, MD;  Location: [facility];  Service: Orthopedics;  Laterality: Left;   BACK SURGERY     CARDIAC CATHETERIZATION N/A 12/22/2015   Procedure: Left Heart Cath and Coronary Angiography;  Surgeon: Peter M Swaziland, MD;  Location: [facility] INVASIVE [facility];  Service: Cardiovascular;  Laterality: N/A;   CARDIAC ELECTROPHYSIOLOGY STUDY AND ABLATION     ELECTROPHYSIOLOGIC STUDY N/A 02/19/2016   Procedure: SVT Ablation;  Surgeon: Marinus Maw, MD;  Location: [facility] INVASIVE [facility] LAB;  Service: Cardiovascular;  Laterality: N/A;   septal reconstruction  2017   TURBINATE REDUCTION  Bilateral 07/2016   WISDOM TOOTH EXTRACTION      Family History  Problem Relation Age of Onset   Hypertension Father    Food Allergy Son    Colon cancer Neg Hx    Stomach cancer Neg Hx    Allergic rhinitis Neg Hx    Angioedema Neg Hx    Asthma Neg Hx    Eczema Neg Hx    Immunodeficiency Neg Hx    Urticaria Neg Hx     Allergies  Allergen Reactions   Pork-Derived Products Swelling   Statins     Muscle aches   Tree Extract Other (See Comments)    Intolerance     Current Outpatient Medications on File Prior to Visit  Medication Sig Dispense Refill   acetaminophen (TYLENOL) 325 MG tablet Take 2 tablets (650 mg total) by mouth every 6 (six) hours.     albuterol (PROAIR HFA) 108 (90 Base) MCG/ACT inhaler Inhale 1-2 puffs into the lungs every 6 (six) hours as needed for wheezing or shortness of breath. 24 g 3   Alcaftadine 0.25 % SOLN Apply 1 drop to eye daily as needed (Allergic eye irritation). 5 mL 5   azelastine (ASTELIN) 0.1 % nasal spray 1-2 sprays per nostril 2 times daily as needed. 90 mL 3   budesonide-formoterol (SYMBICORT) 160-4.5 MCG/ACT inhaler Inhale 2 puffs into the lungs in the morning and at bedtime. 3 each 3   cyclobenzaprine (FLEXERIL) 10 MG tablet Take 1 tablet (10 mg total) by mouth 3 (three) times daily as needed. 45 tablet 0   diclofenac (VOLTAREN) 75 MG EC tablet Take 1 tablet (75 mg total) by mouth 2 (two) times daily. 90 tablet 2   ezetimibe (ZETIA) 10 MG tablet Take 1 tablet (10 mg total) by mouth daily. 30 tablet 11   famotidine-calcium carbonate-magnesium hydroxide (PEPCID COMPLETE) 10-800-165 MG chewable tablet Chew by mouth.     fenofibrate (TRICOR) 145 MG tablet Take 1 tablet by mouth daily.     flecainide (TAMBOCOR) 150 MG tablet Take 1 tablet (150 mg total) by mouth 2 (two) times daily. 180 tablet 3   fluticasone (FLONASE) 50 MCG/ACT nasal spray Place 1 spray into both nostrils daily. 48 g 3   gabapentin (NEURONTIN) 100 MG capsule Take 1 capsule  (100 mg total) by mouth 3 (three) times daily. 90 capsule 0   ibuprofen (ADVIL) 200 MG tablet Take 3 tablets (600 mg total) by mouth every 6 (six) hours.     levocetirizine (XYZAL) 5 MG tablet TAKE 1 TABLET(5 MG) BY MOUTH EVERY EVENING 90 tablet 2   Magnesium 250 MG TABS Take 1  tablet by mouth once a week.     meclizine (ANTIVERT) 25 MG tablet Take 1 tablet (25 mg total) by mouth 3 (three) times daily as needed for dizziness. 15 tablet 0   meloxicam (MOBIC) 15 MG tablet Take 1 tablet (15 mg total) by mouth daily. 30 tablet 1   metFORMIN (GLUCOPHAGE) 500 MG tablet Take 1 tablet (500 mg total) by mouth 2 (two) times daily with a meal. 60 tablet 3   metoprolol tartrate (LOPRESSOR) 25 MG tablet Take one tablet by mouth twice a day.  May take one additional tablet by mouth as needed for breakthrough palpitations. 270 tablet 3   montelukast (SINGULAIR) 10 MG tablet TAKE 1 TABLET BY MOUTH 1 TIME AT NIGHT FOR COUGHING OR WHEEZING 90 tablet 3   pantoprazole (PROTONIX) 40 MG tablet Take 1 tablet (40 mg total) by mouth 2 (two) times daily. 60 tablet 0   PRESCRIPTION MEDICATION Inhale into the lungs at bedtime. CPAP     rivaroxaban (XARELTO) 20 MG TABS tablet Take 1 tablet (20 mg total) by mouth daily with supper. 90 tablet 3   sildenafil (REVATIO) 20 MG tablet Take 1-2 tablets (20-40 mg total) by mouth as needed. 60 tablet 1   Vitamin D, Ergocalciferol, (DRISDOL) 1.25 MG (50000 UT) CAPS capsule Take 1 capsule (50,000 Units total) by mouth every 7 (seven) days. 4 capsule 0   No current facility-administered medications on file prior to visit.    BP 122/74 (BP Location: Left Arm, Patient Position: Sitting, Cuff Size: Normal)   Pulse 66   Temp 98.2 F (36.8 C)   Resp 18   Ht 5\' 6"  (1.676 m)   Wt 253 lb 3.2 oz (114.9 kg)   SpO2 98%   BMI 40.87 kg/m       Objective:   Physical Exam  General Mental Status- Alert. General Appearance- Not in acute distress.   Skin General: Color- Normal Color.  Moisture- Normal Moisture.  Neck Carotid Arteries- Normal color. Moisture- Normal Moisture. No carotid bruits. No JVD.  Chest and Lung Exam Auscultation: Breath Sounds:-Normal.  Cardiovascular Auscultation:Rythm- Regular. Murmurs & Other Heart Sounds:Auscultation of the heart reveals- No Murmurs.  Abdomen Inspection:-Inspeection Normal. Palpation/Percussion:Note:No mass. Palpation and Percussion of the abdomen reveal- Non Tender, Non Distended + BS, no rebound or guarding.   Neurologic Cranial Nerve exam:- CN III-XII intact(No nystagmus), symmetric smile. Drift Test:- No drift. Romberg Exam:- Negative.  Heal to Toe Gait exam:-Normal. Finger to Nose:- Normal/Intact Strength:- 5/5 equal and symmetric strength both upper and lower extremities.       Assessment & Plan:  History of hypertension and your blood pressure is well controlled today.  Continue current BP medications.  You do mention some intermittent low blood pressure readings.  Would ask that you schedule BP check/nurse visit within the next 2weeks.  Bring in your machine so we can check against our reading.  Want to make sure you are getting accurate at home readings.  If blood pressure is consistently low and your machine is accurate then might need to adjust medication.  History of atrial fibrillation.  Continue metoprolol, flecainide and Xarelto.  Continue to follow-up with cardiologist as regularly scheduled.  History of GERD and symptoms stable/controlled.  Continue Protonix and healthy diet.  For fatigue added B12, B1, vitamin D, TSH and T4 labs.  For report of mild frequent urination included PSA lab.  For intermittent blurred vision history placed referral to optometrist.  For elevated blood sugar recommend low sugar  diet.  Continue metformin.  Follow-up date to be determined after lab review.  Discussion regarding intermittent occasional clear drainage from right ear.  Right ear canal exam normal.  No  etiology found.  Offered referral to ENT but she declined.  If symptoms recur or changes let me know and we will place that referral.  Esperanza Richters, PA-C    Time spent with patient today was  41 minutes which consisted of chart review, discussing diagnosis, work up, treatment and documentation.

## 2021-04-15 NOTE — Addendum Note (Signed)
Addended by: Mervin Kung A on: 04/15/2021 03:54 PM   Modules accepted: Orders

## 2021-04-15 NOTE — Therapy (Signed)
[facility] [address]  [place], [place], [address] Phone: [phone]   Fax:  [phone]  Physical Therapy Treatment  Patient Details  Name: Richard Walker MRN: [id number] Date of Birth: [birth year] Referring Provider (PT): Clearance Coots, MD   Encounter Date: 04/15/2021   PT End of Session - 04/15/21 1438     Visit Number 35    Number of Visits 37    Date for PT Re-Evaluation 04/20/21    Authorization Type Medicare & Traditional Medicaid    PT Start Time 1401    PT Stop Time 1696    PT Time Calculation (min) 38 min    Activity Tolerance Patient tolerated treatment well    Behavior During Therapy [facility] for tasks assessed/performed             Past Medical History:  Diagnosis Date   DDD (degenerative disc disease), lumbar    L3/L4   Diverticulosis    GERD (gastroesophageal reflux disease)    Hyperlipidemia    Low sperm motility    Pre-diabetes    Sleep apnea    uses cpap    SVT (supraventricular tachycardia) ([place])     Past Surgical History:  Procedure Laterality Date   AMPUTATION Left 07/15/2018   Procedure: . REPAIR OF LEFT INDEX AND LONG FINGER BY FUSION /  REPAIR OF RING FINGER;  Surgeon: Milly Jakob, MD;  Location: [facility];  Service: Orthopedics;  Laterality: Left;   BACK SURGERY     CARDIAC CATHETERIZATION N/A 12/22/2015   Procedure: Left Heart Cath and Coronary Angiography;  Surgeon: Peter M Martinique, MD;  Location: [facility];  Service: Cardiovascular;  Laterality: N/A;   CARDIAC ELECTROPHYSIOLOGY STUDY AND ABLATION     ELECTROPHYSIOLOGIC STUDY N/A 02/19/2016   Procedure: SVT Ablation;  Surgeon: Evans Lance, MD;  Location: [facility];  Service: Cardiovascular;  Laterality: N/A;   septal reconstruction  2017   TURBINATE REDUCTION Bilateral 07/2016   WISDOM TOOTH EXTRACTION      There were no vitals filed for this visit.   Subjective Assessment - 04/15/21 1404      Subjective Doing about the same.    Pertinent History none    Diagnostic tests Per MD- "Independent review of the CT lumbar spine from 2/15 shows stable interbody fusion at L4/5 and degenerative disc disease at L5-S1.    Independent review of the CT cervical spine 2/15 shows severe degenerative change at C5-6.    Independent review the B knee x-ray from 2/15 shows no acute changes."    Patient Stated Goals decrease knee pain, be able to kneel for prayer    Currently in Pain? Yes    Pain Score 3     Pain Location Knee    Pain Orientation Right;Left    Pain Descriptors / Indicators Aching    Pain Type Chronic pain                               [facility] Adult PT Treatment/Exercise - 04/15/21 0001       Knee/Hip Exercises: Stretches   Hip Flexor Stretch Both;2 reps;30 seconds    Hip Flexor Stretch Limitations mod thomas with strap      Knee/Hip Exercises: Standing   Knee Flexion Strengthening;Both;10 reps    Knee Flexion Limitations green TB with counter support; 2x10    Hip Abduction Stengthening;Both;10 reps;Knee  straight    Abduction Limitations green TB at ankles; counter support    Hip Extension Stengthening;Both;10 reps;Knee straight    Extension Limitations counter support, green TB at ankles    Forward Step Up Both;10 reps;Hand Hold: 2;Step Height: 8"    Step Down Both;5 reps;Hand Hold: 1;Step Height: 6"    Step Down Limitations increased pain so repetitions decreased      Knee/Hip Exercises: Supine   Bridges Strengthening;Both;2 sets;5 reps    Bridges Limitations bridge with march, green TB                      PT Short Term Goals - 02/16/21 1501       PT SHORT TERM GOAL #1   Title Patient to be independent with initial HEP.    Time 3    Period Weeks    Status Achieved    Target Date 11/26/20               PT Long Term Goals - 03/30/21 1412       PT LONG TERM GOAL #1   Title Patient to be independent with advanced HEP.     Time 3    Period Weeks    Status Partially Met   progressed today   Target Date 04/20/21      PT LONG TERM GOAL #2   Title Patient to demonstrate L UE strength >/=4+/5.    Time 3    Period Weeks    Status Partially Met   still limited in R wrist, B grip   Target Date 04/20/21      PT LONG TERM GOAL #3   Title Patient to demonstrate B LE strength >/=4+/5.    Time 6    Period Weeks    Status Achieved   met in hips and knees     PT LONG TERM GOAL #4   Title Patient to demonstrate cervical AROM WFL and without pain limiting.    Time 8    Period Weeks    Status Achieved   improved in flexion, extension, and R sidebending     PT LONG TERM GOAL #5   Title Patient to report tolerance for kneeling to pray d/t improvement in pain levels.    Time 3    Period Weeks    Status On-going   unable d/t B knee pain but stays in sitting position with LEs supported   Target Date 04/20/21      PT LONG TERM GOAL #6   Title Patient to report tolerance for 2 hours on his feet without pain limiting.    Time 3    Period Weeks    Status On-going   reports 15-20 min   Target Date 04/20/21      PT LONG TERM GOAL #7   Title Patient to report 90% improvement in dizziness.    Time 8    Period Weeks    Status Achieved   reports 90% improvement                  Plan - 04/15/21 1439     Clinical Impression Statement Pt reportedly was doing the same at the start of today's session. He demonstrated and reported more pain with WB on the L LE during step downs. Continued working on glute strengthening to provide proximal stability for the knee joints the reduce stress. Cues given to prevent compensation during standing hip exercises and to properly target  the hamstrings with hs curls. Difficulty noted with bridges and cues for eccentric control with marches. Pt w/o any complaints post session and responded well.    Personal Factors and Comorbidities Age;Comorbidity 1;Fitness;Past/Current  Experience;Time since onset of injury/illness/exacerbation    Comorbidities back surgery L3-4 1997    PT Frequency 2x / week    PT Duration 6 weeks    PT Treatment/Interventions ADLs/Self Care Home Management;Canalith Repostioning;Cryotherapy;Electrical Stimulation;Iontophoresis 51m/ml Dexamethasone;Moist Heat;Traction;Balance training;Therapeutic exercise;Therapeutic activities;Functional mobility training;Stair training;Gait training;Ultrasound;Neuromuscular re-education;Patient/family education;Manual techniques;Vestibular;Vasopneumatic Device;Taping;Energy conservation;Dry needling;Passive range of motion;Scar mobilization    PT Next Visit Plan continue functional strengthening, dry needling to quads?    Consulted and Agree with Plan of Care Patient             Patient will benefit from skilled therapeutic intervention in order to improve the following deficits and impairments:  Abnormal gait, Hypomobility, Decreased activity tolerance, Decreased strength, Increased fascial restricitons, Impaired UE functional use, Pain, Difficulty walking, Decreased balance, Increased muscle spasms, Improper body mechanics, Dizziness, Decreased range of motion, Impaired flexibility, Postural dysfunction  Visit Diagnosis: Chronic pain of left knee  Chronic pain of right knee  Bilateral low back pain, unspecified chronicity, unspecified whether sciatica present  Muscle weakness (generalized)  Difficulty in walking, not elsewhere classified  Acute pain of right knee  Pain in right hand  Stiffness of right hand, not elsewhere classified     Problem List Patient Active Problem List   Diagnosis Date Noted   OSA on CPAP 02/19/2021   Bite, snake 01/28/2021   Neck pain 11/13/2020   Vertigo 11/13/2020   Hypertension 11/03/2020   Contusion of bone 10/23/2020   Degenerative tear of left medial meniscus 10/23/2020   Concussion with no loss of consciousness 10/23/2020   Paroxysmal atrial  fibrillation ([place] 05/22/2019   Unspecified atrial flutter ([facility] 05/22/2019   Pain in right foot 03/30/2018   Perennial and seasonal allergic rhinitis 11/09/2017   History of food allergy 11/09/2017   Wheezing 11/09/2017   Dyspnea/wheezing 10/31/2017   Cough 10/31/2017   Bronchitis, mucopurulent recurrent ([place] 09/07/2017   SVT (supraventricular tachycardia) ([place] 04/23/2015   Abdominal pain, epigastric 06/03/2013   Unspecified vitamin D deficiency 04/09/2013   Esophageal reflux 04/09/2013   Hepatitis B carrier ([place] 04/09/2013   Other and unspecified hyperlipidemia 04/09/2013   Unspecified sleep apnea 04/09/2013    BArtist Pais PTA 04/15/2021, 2:51 PM  [facility] [place] [address]Phone: [phone]  Fax:  [phone] Name: SOLUWATOMIWA KINYONMRN: [id number] of Birth: [birth year]

## 2021-04-15 NOTE — Patient Instructions (Addendum)
History of hypertension and your blood pressure is well controlled today.  Continue current BP medications.  You do mention some intermittent low blood pressure readings.  Would ask that you schedule BP check/nurse visit within the next 2weeks.  Bring in your machine so we can check against our reading.  Want to make sure you are getting accurate at home readings.  If blood pressure is consistently low and your machine is accurate then might need to adjust medication.  History of atrial fibrillation.  Continue metoprolol, flecainide and Xarelto.  Continue to follow-up with cardiologist as regularly scheduled.  History of GERD and symptoms stable/controlled.  Continue Protonix and healthy diet.  For fatigue added B12, B1, vitamin D, TSH and T4 labs.  For report of mild frequent urination included PSA lab.  For intermittent blurred vision history placed referral to optometrist.  For elevated blood sugar recommend low sugar diet.  Continue metformin.  Follow-up date to be determined after lab review.  Discussion regarding intermittent occasional clear drainage from right ear.  Right ear canal exam normal.  No etiology found.  Offered referral to ENT but she declined.  If symptoms recur or changes let me know and we will place that referral.

## 2021-04-16 LAB — PSA: PSA: 1.37 ng/mL (ref 0.10–4.00)

## 2021-04-16 LAB — T4, FREE: Free T4: 0.88 ng/dL (ref 0.60–1.60)

## 2021-04-16 LAB — TSH: TSH: 1.47 u[IU]/mL (ref 0.35–5.50)

## 2021-04-16 LAB — VITAMIN B12: Vitamin B-12: 330 pg/mL (ref 211–911)

## 2021-04-16 LAB — VITAMIN D 25 HYDROXY (VIT D DEFICIENCY, FRACTURES): VITD: 22.93 ng/mL — ABNORMAL LOW (ref 30.00–100.00)

## 2021-04-16 MED ORDER — VITAMIN D (ERGOCALCIFEROL) 1.25 MG (50000 UNIT) PO CAPS: 50000.0000 [IU] | ORAL_CAPSULE | ORAL | 0 refills | Status: DC

## 2021-04-16 NOTE — Addendum Note (Signed)
Addended by: Gwenevere Abbot on: 04/16/2021 05:43 PM   Modules accepted: Orders

## 2021-04-17 ENCOUNTER — Other Ambulatory Visit: Payer: Self-pay

## 2021-04-17 DIAGNOSIS — E559 Vitamin D deficiency, unspecified: Secondary | ICD-10-CM

## 2021-04-18 LAB — VITAMIN B1: Vitamin B1 (Thiamine): 23 nmol/L (ref 8–30)

## 2021-04-21 ENCOUNTER — Other Ambulatory Visit: Payer: Self-pay

## 2021-04-21 ENCOUNTER — Ambulatory Visit: Payer: Medicare Other | Admitting: Physical Therapy

## 2021-04-21 ENCOUNTER — Encounter: Payer: Self-pay | Admitting: Physical Therapy

## 2021-04-21 DIAGNOSIS — G8929 Other chronic pain: Secondary | ICD-10-CM

## 2021-04-21 DIAGNOSIS — M25561 Pain in right knee: Secondary | ICD-10-CM

## 2021-04-21 DIAGNOSIS — M25562 Pain in left knee: Secondary | ICD-10-CM | POA: Diagnosis not present

## 2021-04-21 DIAGNOSIS — R262 Difficulty in walking, not elsewhere classified: Secondary | ICD-10-CM

## 2021-04-21 DIAGNOSIS — M6281 Muscle weakness (generalized): Secondary | ICD-10-CM

## 2021-04-21 DIAGNOSIS — M545 Low back pain, unspecified: Secondary | ICD-10-CM

## 2021-04-21 NOTE — Therapy (Signed)
[facility] [address]  [place], [place], [address] Phone: [phone]   Fax:  [phone]  Physical Therapy Treatment  Patient Details  Name: Richard Walker MRN: [id number] Date of Birth: 10-03-1955 Referring Provider (PT): Clearance Coots, MD   Encounter Date: 04/21/2021   PT End of Session - 04/21/21 1624     Visit Number 36    Number of Visits 37    Date for PT Re-Evaluation 04/20/21    Authorization Type Medicare & Traditional Medicaid    PT Start Time 9379    PT Stop Time 0240    PT Time Calculation (min) 45 min    Activity Tolerance Patient tolerated treatment well;Patient limited by pain    Behavior During Therapy [facility] for tasks assessed/performed             Past Medical History:  Diagnosis Date   DDD (degenerative disc disease), lumbar    L3/L4   Diverticulosis    GERD (gastroesophageal reflux disease)    Hyperlipidemia    Low sperm motility    Pre-diabetes    Sleep apnea    uses cpap    SVT (supraventricular tachycardia) ([place])     Past Surgical History:  Procedure Laterality Date   AMPUTATION Left 07/15/2018   Procedure: . REPAIR OF LEFT INDEX AND LONG FINGER BY FUSION /  REPAIR OF RING FINGER;  Surgeon: Milly Jakob, MD;  Location: [facility];  Service: Orthopedics;  Laterality: Left;   BACK SURGERY     CARDIAC CATHETERIZATION N/A 12/22/2015   Procedure: Left Heart Cath and Coronary Angiography;  Surgeon: Peter M Martinique, MD;  Location: [facility];  Service: Cardiovascular;  Laterality: N/A;   CARDIAC ELECTROPHYSIOLOGY STUDY AND ABLATION     ELECTROPHYSIOLOGIC STUDY N/A 02/19/2016   Procedure: SVT Ablation;  Surgeon: Evans Lance, MD;  Location: [facility];  Service: Cardiovascular;  Laterality: N/A;   septal reconstruction  2017   TURBINATE REDUCTION Bilateral 07/2016   WISDOM TOOTH EXTRACTION      There were no vitals filed for this visit.   Subjective Assessment -  04/21/21 1535     Subjective Patient participated in son's wedding and thinks overdid a bit, very sore in legs.    Diagnostic tests Per MD- "Independent review of the CT lumbar spine from 2/15 shows stable interbody fusion at L4/5 and degenerative disc disease at L5-S1.    Independent review of the CT cervical spine 2/15 shows severe degenerative change at C5-6.    Independent review the B knee x-ray from 2/15 shows no acute changes."    Patient Stated Goals decrease knee pain, be able to kneel for prayer    Currently in Pain? Yes    Pain Score 7     Pain Location Knee    Pain Orientation Right;Left    Pain Descriptors / Indicators Aching    Pain Type Acute pain                               [facility] Adult PT Treatment/Exercise - 04/21/21 0001       Exercises   Exercises Knee/Hip      Lumbar Exercises: Stretches   Active Hamstring Stretch --      Knee/Hip Exercises: Stretches   Active Hamstring Stretch Both;3 reps;30 seconds    Active Hamstring Stretch Limitations VC to not push on knee  Knee/Hip Exercises: Aerobic   Nustep L6 x 6 min (UEs/LEs)      Knee/Hip Exercises: Prone   Hamstring Curl 10 reps      Iontophoresis   Type of Iontophoresis Dexamethasone    Location L medial knee    Dose 40m*minutes, 1.0 ml    Time 4 hour patch      Manual Therapy   Manual Therapy Soft tissue mobilization;Myofascial release    Soft tissue mobilization IASTM with foam roller to bil hamstrings, IASTM with s/s tool to L quad    Myofascial Release STM/TPR to L medial hamstring and L pes anserine                      PT Short Term Goals - 02/16/21 1501       PT SHORT TERM GOAL #1   Title Patient to be independent with initial HEP.    Time 3    Period Weeks    Status Achieved    Target Date 11/26/20               PT Long Term Goals - 03/30/21 1412       PT LONG TERM GOAL #1   Title Patient to be independent with advanced HEP.    Time 3     Period Weeks    Status Partially Met   progressed today   Target Date 04/20/21      PT LONG TERM GOAL #2   Title Patient to demonstrate L UE strength >/=4+/5.    Time 3    Period Weeks    Status Partially Met   still limited in R wrist, B grip   Target Date 04/20/21      PT LONG TERM GOAL #3   Title Patient to demonstrate B LE strength >/=4+/5.    Time 6    Period Weeks    Status Achieved   met in hips and knees     PT LONG TERM GOAL #4   Title Patient to demonstrate cervical AROM WFL and without pain limiting.    Time 8    Period Weeks    Status Achieved   improved in flexion, extension, and R sidebending     PT LONG TERM GOAL #5   Title Patient to report tolerance for kneeling to pray d/t improvement in pain levels.    Time 3    Period Weeks    Status On-going   unable d/t B knee pain but stays in sitting position with LEs supported   Target Date 04/20/21      PT LONG TERM GOAL #6   Title Patient to report tolerance for 2 hours on his feet without pain limiting.    Time 3    Period Weeks    Status On-going   reports 15-20 min   Target Date 04/20/21      PT LONG TERM GOAL #7   Title Patient to report 90% improvement in dizziness.    Time 8    Period Weeks    Status Achieved   reports 90% improvement                  Plan - 04/21/21 1624     Clinical Impression Statement Patient reported he was doing really well last few session with much less knee pain, but then after dancing at sons wedding, even though he tried to dance gently and limit movements, his quads and hamstrings are really  sore, and L knee pain increased significantly.   Reported decreased pain after gentle stretches, exercises, and manual therapy, but had point tenderness at L pes anserine, so applied iontopatch with dexamethasone to area, after educating patient on care and removal time.  Pt. would benefit from continued skilled therapy.    Personal Factors and Comorbidities Age;Comorbidity  1;Fitness;Past/Current Experience;Time since onset of injury/illness/exacerbation    Comorbidities back surgery L3-4 1997    PT Frequency 2x / week    PT Duration 6 weeks    PT Treatment/Interventions ADLs/Self Care Home Management;Canalith Repostioning;Cryotherapy;Electrical Stimulation;Iontophoresis 44m/ml Dexamethasone;Moist Heat;Traction;Balance training;Therapeutic exercise;Therapeutic activities;Functional mobility training;Stair training;Gait training;Ultrasound;Neuromuscular re-education;Patient/family education;Manual techniques;Vestibular;Vasopneumatic Device;Taping;Energy conservation;Dry needling;Passive range of motion;Scar mobilization    PT Next Visit Plan continue functional strengthening, dry needling to quads?    Consulted and Agree with Plan of Care Patient             Patient will benefit from skilled therapeutic intervention in order to improve the following deficits and impairments:  Abnormal gait, Hypomobility, Decreased activity tolerance, Decreased strength, Increased fascial restricitons, Impaired UE functional use, Pain, Difficulty walking, Decreased balance, Increased muscle spasms, Improper body mechanics, Dizziness, Decreased range of motion, Impaired flexibility, Postural dysfunction  Visit Diagnosis: Chronic pain of left knee  Chronic pain of right knee  Bilateral low back pain, unspecified chronicity, unspecified whether sciatica present  Muscle weakness (generalized)  Difficulty in walking, not elsewhere classified     Problem List Patient Active Problem List   Diagnosis Date Noted   OSA on CPAP 02/19/2021   Bite, snake 01/28/2021   Neck pain 11/13/2020   Vertigo 11/13/2020   Hypertension 11/03/2020   Contusion of bone 10/23/2020   Degenerative tear of left medial meniscus 10/23/2020   Concussion with no loss of consciousness 10/23/2020   Paroxysmal atrial fibrillation ([place] 05/22/2019   Unspecified atrial flutter ([place] 05/22/2019   Pain in  right foot 03/30/2018   Perennial and seasonal allergic rhinitis 11/09/2017   History of food allergy 11/09/2017   Wheezing 11/09/2017   Dyspnea/wheezing 10/31/2017   Cough 10/31/2017   Bronchitis, mucopurulent recurrent ([place] 09/07/2017   SVT (supraventricular tachycardia) ([place] 04/23/2015   Abdominal pain, epigastric 06/03/2013   Unspecified vitamin D deficiency 04/09/2013   Esophageal reflux 04/09/2013   Hepatitis B carrier ([facility] [place] 04/09/2013   Other and unspecified hyperlipidemia 04/09/2013   Unspecified sleep apnea 04/09/2013    ERennie NatterPT, DPT 04/21/2021, 4:30 PM  [facility] [address] [place] [address]Phone: [phone]  Fax:  [phone] Name: Richard STAVELYMRN: [id number] of Birth: 110-Jul-1957

## 2021-04-23 ENCOUNTER — Encounter: Payer: Self-pay | Admitting: Physical Therapy

## 2021-04-23 ENCOUNTER — Ambulatory Visit: Payer: Medicare Other | Attending: Family Medicine | Admitting: Physical Therapy

## 2021-04-23 ENCOUNTER — Other Ambulatory Visit: Payer: Self-pay

## 2021-04-23 DIAGNOSIS — M25562 Pain in left knee: Secondary | ICD-10-CM | POA: Diagnosis not present

## 2021-04-23 DIAGNOSIS — M6281 Muscle weakness (generalized): Secondary | ICD-10-CM | POA: Diagnosis present

## 2021-04-23 DIAGNOSIS — M25561 Pain in right knee: Secondary | ICD-10-CM | POA: Diagnosis present

## 2021-04-23 DIAGNOSIS — G8929 Other chronic pain: Secondary | ICD-10-CM | POA: Insufficient documentation

## 2021-04-23 DIAGNOSIS — M545 Low back pain, unspecified: Secondary | ICD-10-CM | POA: Insufficient documentation

## 2021-04-23 NOTE — Patient Instructions (Signed)

## 2021-04-23 NOTE — Therapy (Signed)
[facility] [address]  [place], [place], [address] Phone: [phone]   Fax:  [phone]  Physical Therapy Discharge  PHYSICAL THERAPY DISCHARGE SUMMARY  Visits from Start of Care: 37  Current functional level related to goals / functional outcomes: Independent with all mobility   Remaining deficits: Grip strength bil   Education / Equipment: HEP , theraband  Plan: Patient agrees to discharge.  Patient is being discharged due to meeting the stated rehab goals.       Patient Details  Name: Richard Walker MRN: [id number] Date of Birth: 1956/06/18 Referring Provider (PT): Clearance Coots, MD   Encounter Date: 04/23/2021   PT End of Session - 04/23/21 1106     Visit Number 37    Number of Visits 37    Date for PT Re-Evaluation 04/20/21    Authorization Type Medicare & Traditional Medicaid    PT Start Time 1100    PT Stop Time 1145    PT Time Calculation (min) 45 min    Activity Tolerance Patient tolerated treatment well;Patient limited by pain    Behavior During Therapy WFL for tasks assessed/performed             Past Medical History:  Diagnosis Date   DDD (degenerative disc disease), lumbar    L3/L4   Diverticulosis    GERD (gastroesophageal reflux disease)    Hyperlipidemia    Low sperm motility    Pre-diabetes    Sleep apnea    uses cpap    SVT (supraventricular tachycardia) ([place])     Past Surgical History:  Procedure Laterality Date   AMPUTATION Left 07/15/2018   Procedure: . REPAIR OF LEFT INDEX AND LONG FINGER BY FUSION /  REPAIR OF RING FINGER;  Surgeon: Milly Jakob, MD;  Location: [facility];  Service: Orthopedics;  Laterality: Left;   BACK SURGERY     CARDIAC CATHETERIZATION N/A 12/22/2015   Procedure: Left Heart Cath and Coronary Angiography;  Surgeon: Peter M Martinique, MD;  Location: [facility];  Service: Cardiovascular;  Laterality: N/A;   CARDIAC ELECTROPHYSIOLOGY STUDY  AND ABLATION     ELECTROPHYSIOLOGIC STUDY N/A 02/19/2016   Procedure: SVT Ablation;  Surgeon: Evans Lance, MD;  Location: [facility];  Service: Cardiovascular;  Laterality: N/A;   septal reconstruction  2017   TURBINATE REDUCTION Bilateral 07/2016   WISDOM TOOTH EXTRACTION      There were no vitals filed for this visit.   Subjective Assessment - 04/23/21 1105     Subjective Patient reported his knee is much better today after the ionto patch.  He can do everything now, just has some questions on stretches.    Diagnostic tests Per MD- "Independent review of the CT lumbar spine from 2/15 shows stable interbody fusion at L4/5 and degenerative disc disease at L5-S1.    Independent review of the CT cervical spine 2/15 shows severe degenerative change at C5-6.    Independent review the B knee x-ray from 2/15 shows no acute changes."    Patient Stated Goals decrease knee pain, be able to kneel for prayer    Currently in Pain? Yes    Pain Score 2     Pain Location Knee    Pain Orientation Right;Left    Pain Descriptors / Indicators Aching                [facility] PT Assessment - 04/23/21 0001  Assessment   Medical Diagnosis Contusion of bone, degenerative tear of L medial meniscus, concussion without LOC    Referring Provider (PT) Clearance Coots, MD    Onset Date/Surgical Date 10/05/20    Hand Dominance Right                           [facility] Adult PT Treatment/Exercise - 04/23/21 0001       Self-Care   Self-Care Heat/Ice Application    Heat/Ice Application education on ice massage      Exercises   Exercises Knee/Hip      Knee/Hip Exercises: Aerobic   Nustep L6 x 6 min (UEs/LEs)      Knee/Hip Exercises: Standing   Other Standing Knee Exercises knee flexion stretch in standing      Knee/Hip Exercises: Seated   Other Seated Knee/Hip Exercises calf and peroneal stretches in sitting.      Manual Therapy   Manual Therapy Soft tissue  mobilization;Myofascial release;Other (comment)   dry needling   Soft tissue mobilization IASTM with foam roller to bil hamstrings, IASTM with s/s tool to L quad    Myofascial Release STM/TPR to L medial hamstring and L pes anserine    Other Manual Therapy Dry needling L medial gastroc              Trigger Point Dry Needling - 04/23/21 0001     Consent Given? Yes    Education Handout Provided Yes    Muscles Treated Lower Quadrant Gastrocnemius   L medial head   Gastrocnemius Response Twitch response elicited;Palpable increased muscle length                  PT Education - 04/23/21 1204     Education Details review of HEP and stretches, education on dry needling including risks and benefits.    Person(s) Educated Patient    Methods Explanation;Demonstration;Handout    Comprehension Verbalized understanding              PT Short Term Goals - 02/16/21 1501       PT SHORT TERM GOAL #1   Title Patient to be independent with initial HEP.    Time 3    Period Weeks    Status Achieved    Target Date 11/26/20               PT Long Term Goals - 04/23/21 1148       PT LONG TERM GOAL #1   Title Patient to be independent with advanced HEP.    Time 3    Period Weeks    Status Achieved   consistent with HEP, progressed today     PT LONG TERM GOAL #2   Title Patient to demonstrate L UE strength >/=4+/5.    Time 3    Period Weeks    Status Partially Met   decreased grip strength     PT LONG TERM GOAL #3   Title Patient to demonstrate B LE strength >/=4+/5.    Time 6    Period Weeks    Status Achieved   met in hips and knees     PT LONG TERM GOAL #4   Title Patient to demonstrate cervical AROM WFL and without pain limiting.    Time 8    Period Weeks    Status Achieved   improved in flexion, extension, and R sidebending     PT LONG TERM GOAL #5  Title Patient to report tolerance for kneeling to pray d/t improvement in pain levels.    Time 3     Period Weeks    Status Achieved   able to get up and down from floor to kneel now, still has some discomfort.     PT LONG TERM GOAL #6   Title Patient to report tolerance for 2 hours on his feet without pain limiting.    Time 3    Period Weeks    Status Achieved   reports 15-20 min     PT LONG TERM GOAL #7   Title Patient to report 90% improvement in dizziness.    Time 8    Period Weeks    Status Achieved   reports 90% improvement                  Plan - 04/23/21 1152     Clinical Impression Statement Patient reported that his knee pain improved significantly after application of ionto patch with dex.  He reports that he has been able to do everything now, including kneeling for prayer, although he does have some tightness in his L leg still.  We reveiwed stretches to address, recommended self STM prior to stretching to help improve stretch.  He did have palpable trigger point in his L medial gastroc, so dry needled area and noted improve stretch.   Also educated on ice massage if he has localized area of pain from overuse again.  Otherwise, he is very happy with his progress and is ready and agreeable to discharge today.    Personal Factors and Comorbidities Age;Comorbidity 1;Fitness;Past/Current Experience;Time since onset of injury/illness/exacerbation    Comorbidities back surgery L3-4 1997    PT Frequency 2x / week    PT Duration 6 weeks    PT Treatment/Interventions ADLs/Self Care Home Management;Canalith Repostioning;Cryotherapy;Electrical Stimulation;Iontophoresis 64m/ml Dexamethasone;Moist Heat;Traction;Balance training;Therapeutic exercise;Therapeutic activities;Functional mobility training;Stair training;Gait training;Ultrasound;Neuromuscular re-education;Patient/family education;Manual techniques;Vestibular;Vasopneumatic Device;Taping;Energy conservation;Dry needling;Passive range of motion;Scar mobilization    Consulted and Agree with Plan of Care Patient              Patient will benefit from skilled therapeutic intervention in order to improve the following deficits and impairments:  Abnormal gait, Hypomobility, Decreased activity tolerance, Decreased strength, Increased fascial restricitons, Impaired UE functional use, Pain, Difficulty walking, Decreased balance, Increased muscle spasms, Improper body mechanics, Dizziness, Decreased range of motion, Impaired flexibility, Postural dysfunction  Visit Diagnosis: Chronic pain of left knee  Chronic pain of right knee  Bilateral low back pain, unspecified chronicity, unspecified whether sciatica present  Muscle weakness (generalized)     Problem List Patient Active Problem List   Diagnosis Date Noted   OSA on CPAP 02/19/2021   Bite, snake 01/28/2021   Neck pain 11/13/2020   Vertigo 11/13/2020   Hypertension 11/03/2020   Contusion of bone 10/23/2020   Degenerative tear of left medial meniscus 10/23/2020   Concussion with no loss of consciousness 10/23/2020   Paroxysmal atrial fibrillation ([facility] 05/22/2019   Unspecified atrial flutter ([facility] 05/22/2019   Pain in right foot 03/30/2018   Perennial and seasonal allergic rhinitis 11/09/2017   History of food allergy 11/09/2017   Wheezing 11/09/2017   Dyspnea/wheezing 10/31/2017   Cough 10/31/2017   Bronchitis, mucopurulent recurrent ([facility] 09/07/2017   SVT (supraventricular tachycardia) ([facility] 04/23/2015   Abdominal pain, epigastric 06/03/2013   Unspecified vitamin D deficiency 04/09/2013   Esophageal reflux 04/09/2013   Hepatitis B carrier ([facility] 04/09/2013   Other and unspecified hyperlipidemia  04/09/2013   Unspecified sleep apnea 04/09/2013    Rennie Natter PT, DPT 04/23/2021, 12:04 PM  [facility] [address]  [place], [place], [address] Phone: [phone]   Fax:  [phone]  Name: Richard Walker MRN: [id number] Date of Birth: [birth year]

## 2021-05-04 ENCOUNTER — Encounter: Payer: Self-pay | Admitting: Family Medicine

## 2021-05-04 ENCOUNTER — Ambulatory Visit (INDEPENDENT_AMBULATORY_CARE_PROVIDER_SITE_OTHER): Payer: Medicare Other | Admitting: Family Medicine

## 2021-05-04 ENCOUNTER — Other Ambulatory Visit: Payer: Self-pay

## 2021-05-04 VITALS — Ht 66.0 in | Wt 253.0 lb

## 2021-05-04 DIAGNOSIS — M1712 Unilateral primary osteoarthritis, left knee: Secondary | ICD-10-CM

## 2021-05-04 NOTE — Patient Instructions (Signed)
Good to see you Please consider the compression  Please try the nextwave   Please send me a message in MyChart with any questions or updates.  Please see me back in 4 weeks or when you want to do an injection.   --Dr. Jordan Likes
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
## 2021-05-19 ENCOUNTER — Telehealth: Payer: Self-pay | Admitting: Family Medicine

## 2021-05-19 NOTE — Telephone Encounter (Signed)
Per Virgina Norfolk, [employer] rep:   he states he has already spoken to patient regarding below. Ray states he is going to call Mr. Haynes back and try to clear up the confusion regarding the order Dr. Jordan Likes placed for the patient to receive a new Zynex NexWave.

## 2021-05-19 NOTE — Telephone Encounter (Signed)
Patient stopped by office states being billed by [employer] for device he was previously told by them was cvd by Medicare , but now be billed for $250 (--pt says can't not afford )  but [employer] request pmt on it before they will send him a different more affordable one) he is concerned & doesn't know what to do.  -Advised pt to contact Medicare directly for confirmation device is or isn't covered (1st)-- if not to contact [employer] to send device back to them .  Pt also states his attny office will be sending request for OV notes for trmt dates.  Pt states will continue to do given exercises provider listed & if condition flares he will cll to schedule appt.   --glh

## 2021-05-26 ENCOUNTER — Encounter: Payer: Self-pay | Admitting: Family Medicine

## 2021-05-26 ENCOUNTER — Ambulatory Visit (INDEPENDENT_AMBULATORY_CARE_PROVIDER_SITE_OTHER): Payer: Medicare Other | Admitting: Family Medicine

## 2021-05-26 DIAGNOSIS — M1712 Unilateral primary osteoarthritis, left knee: Secondary | ICD-10-CM

## 2021-05-26 DIAGNOSIS — M542 Cervicalgia: Secondary | ICD-10-CM

## 2021-05-26 MED ORDER — CYCLOBENZAPRINE HCL 10 MG PO TABS: 10.0000 mg | ORAL_TABLET | Freq: Three times a day (TID) | ORAL | 1 refills | Status: DC | PRN

## 2021-05-26 NOTE — Patient Instructions (Signed)
Good to see you Please continue the exercises   Please send me a message in MyChart with any questions or updates.  Please see me back as needed.   --Dr. Chukwuebuka Churchill  

## 2021-05-26 NOTE — Assessment & Plan Note (Signed)
Slowly continues to improve his pain.  Still limited in flexion and unable to completely kneel on the knee. -Counseled on home exercise therapy and supportive care. -Refilled Flexeril. -Could consider gel injection going forward.

## 2021-05-26 NOTE — Progress Notes (Signed)
Richard Walker - 65 y.o. male MRN [id number]  Date of birth: Jul 29, 1956  SUBJECTIVE:  Including CC & ROS.  No chief complaint on file.   Richard Walker is a 65 y.o. male that is following up for his knee pain.  Pain continues only mildly.   Review of Systems See HPI   HISTORY: Past Medical, Surgical, Social, and Family History Reviewed & Updated per EMR.   Pertinent Historical Findings include:  Past Medical History:  Diagnosis Date   DDD (degenerative disc disease), lumbar    L3/L4   Diverticulosis    GERD (gastroesophageal reflux disease)    Hyperlipidemia    Low sperm motility    Pre-diabetes    Sleep apnea    uses cpap    [facility]sup[facility]ricular tachycardia) [facility])     Past Surgical History:  Procedure Laterality Date   AMPUTATION Left 07/15/2018   Procedure: . REPAIR OF LEFT INDEX AND LONG FINGER BY FUSION /  REPAIR OF RING FINGER;  Surgeon: Mack Hook, MD;  Location: [facility] OR;  Service: Orthopedics;  Laterality: Left;   BACK SURGERY     CARDIAC CATHETERIZATION N/A 12/22/2015   Procedure: Left Heart Cath and Coronary Angiography;  Surgeon: Peter M Swaziland, MD;  Location: [facility];  Service: Cardiovascular;  Laterality: N/A;   CARDIAC ELECTROPHYSIOLOGY STUDY AND ABLATION     ELECTROPHYSIOLOGIC STUDY N/A 02/19/2016   Procedure: SVT Ablation;  Surgeon: Marinus Maw, MD;  Location: [facility];  Service: Cardiovascular;  Laterality: N/A;   septal reconstruction  2017   TURBINATE REDUCTION Bilateral 07/2016   WISDOM TOOTH EXTRACTION      Family History  Problem Relation Age of Onset   Hypertension Father    Food Allergy Son    Colon cancer Neg Hx    Stomach cancer Neg Hx    Allergic rhinitis Neg Hx    Angioedema Neg Hx    Asthma Neg Hx    Eczema Neg Hx    Immunodeficiency Neg Hx    Urticaria Neg Hx     Social History   Socioeconomic History   Marital status: Married    Spouse name: Not on file   Number of children: Not on file   Years of  education: Not on file   Highest education level: Not on file  Occupational History   Not on file  Tobacco Use   Smoking status: Former    Packs/day: 1.00    Years: 10.00    Pack years: 10.00    Types: Cigarettes    Quit date: 57    Years since quitting: 30.7   Smokeless tobacco: Never   Tobacco comments:    Uses Hookah Pipe occasionally  Vaping Use   Vaping Use: Never used  Substance and Sexual Activity   Alcohol use: No   Drug use: No   Sexual activity: Yes  Other Topics Concern   Not on file  Social History Narrative   Marital Status: Married Engineer, drilling)   Children:  4 (3 Sons/1 Daughter)   Pets:  None    Living Situation: Lives with spouse and 4 children   Origin:  He was born in [place].   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     Alcohol Use:  None   Drug Use:  None   Diet:  Regular   Exercise:  None  Hobbies:  Cards   Social Determinants of Health   Financial Resource Strain: Not on file  Food Insecurity: Not on file  Transportation Needs: Not on file  Physical Activity: Not on file  Stress: Not on file  Social Connections: Not on file  Intimate Partner Violence: Not on file     PHYSICAL EXAM:  VS: Ht 5\' 6"  (1.676 m)   Wt 250 lb (113.4 kg)   BMI 40.35 kg/m  Physical Exam Gen: NAD, alert, cooperative with exam, well-appearing      ASSESSMENT & PLAN:   Primary osteoarthritis of left knee Slowly continues to improve his pain.  Still limited in flexion and unable to completely kneel on the knee. -Counseled on home exercise therapy and supportive care. -Refilled Flexeril. -Could consider gel injection going forward.

## 2021-05-29 ENCOUNTER — Telehealth: Payer: Self-pay | Admitting: Neurology

## 2021-05-29 NOTE — Telephone Encounter (Signed)
Pt called states [place] needs to move appt up sooner. Wasn't sure if it was okay to schedule with [facility] or [facility]. Any appt suggestions? Also pt is wondering if he can take his machine with him while he is gone?

## 2021-06-01 NOTE — Telephone Encounter (Signed)
Called pt back. He is leaving for out of country first week of November. Will be gone 06/23/21. Needing sooner appt. Dr. Dohmeier/Megan/Amy did not have anything sooner. Scheduled appt w/ JM,NP 06/03/21 at 2:15p, asked he check in by 1:45p and bring machine/cord with him to appt. He was set up w/ [employer] machine ([employer] is website). He needed initial follow up between 05/14/21 and 07/12/21 to be compliant w/ insurance.

## 2021-06-03 ENCOUNTER — Ambulatory Visit (INDEPENDENT_AMBULATORY_CARE_PROVIDER_SITE_OTHER): Payer: Medicare Other | Admitting: Adult Health

## 2021-06-03 ENCOUNTER — Encounter: Payer: Self-pay | Admitting: Adult Health

## 2021-06-03 VITALS — BP 135/75 | HR 56 | Ht 69.0 in | Wt 248.0 lb

## 2021-06-03 DIAGNOSIS — Z9989 Dependence on other enabling machines and devices: Secondary | ICD-10-CM

## 2021-06-03 DIAGNOSIS — G4733 Obstructive sleep apnea (adult) (pediatric): Secondary | ICD-10-CM

## 2021-06-03 NOTE — Progress Notes (Signed)
PATIENT: Richard Walker DOB: May 10, 1956  REASON FOR VISIT: CPAP compliance visit HISTORY FROM: patient   Chief Complaint  Patient presents with   Obstructive Sleep Apnea    Rm 3 alone Pt is well, doing well on CPAP. Hasn't not adjusted and got comfortable with mask yet.       HISTORY OF PRESENT ILLNESS:  Update 06/03/2021 JM: Returns today for initial CPAP compliance visit after receiving a new CPAP machine.  Repeat HST completed 03/18/2021 found severe sleep apnea and renewed CPAP prescription for auto titration which was started on 04/13/2021.  Reviewed compliance report from 05/04/2019 -03/03/2021 shows 31 out of 32 usage days with 3132 usage days greater than 4 hours for overall 96.9% compliance with average usage 8 hours and 54 minutes.  Residual AHI 0.1.  Mean pressure 6.5.  Average leak 3.0.  He has had some difficulty adapting to his new mask but otherwise no concerns or complaints.  He does plan on traveling out of the country first week of November.  Epworth Sleepiness Scale 18 (previously 15).  No further concerns at this time      History provided for reference purposes only Update 02/19/2021 Dr. Vickey Huger- RV for Richard Walker, who is a 65 year old cardiac patient with OSA on CPAP. The patient endorsed a high degree of fatigue today at 63 points to the maximum on the fatigue severity score his Epworth Sleepiness Scale was endorsed at 15 points, he is also endorsing some points of possible depression 6 out of 15.  This is based on the geriatric depression score from [employer].  His compliance is excellent 84% he is using the machine usually on average 7 hours 23 minutes at night he has an auto CPAP mean pressure use of 7.5 cm and the device pressure over 90% of the time was around 9 cm water.  His residual AHI or apnea hypopnea index, is at 1.0 he does have some air leakage up to 2 hours each night but that seems only to have been playing a role since late June - so may be his CPAP is  currently in need of being replaced.  HST and new machine order will be needed. Richard Walker is a 65 year old male patient from [place], and had Covid 19 last year, his mother in [place] passed away from it last month. Richard Walker has become a very highly compliant CPAP user now that he has recovered from Covid he is a 97% compliant user by hours with an average of 8 hours 2 minutes and 100% of the last 30 days.  His mean pressure is 7.7 cm his 90% pressure is 9.3 cmH2O and his residual AHI is 0.9/h this is an excellent resolution of underlying apnea.  He says he cannot really sleep well without his CPAP he is dependent on it.  Fatigue severity scale is still high 61/ 32 points , and  the Epworth Sleepiness Scale is still elevated at 16/ 24. .  Richard Walker is a 65 year old male with a history of obstructive sleep apnea on CPAP.  He states that he has had a lot change in medical history, is experiencing exhaustion and fatigue all the time. He finds that he has to take a nap every day. He had Covid in August but was minor. He has concerns of dry mouth/waking up frequently during the night. biotene has helped some.  Richard Walker reports that before he contracted Covid he already felt an increasing amount  of fatigue and exhaustion but it has worsened with the infection.  At this point the patient's Epworth Sleepiness Scale was endorsed at a high level= 18 out of 24 possible points and a narcoleptic level. The fatigue also affects him at 63 out of 63 possible points.  He reports frontal headaches, fragmented sleep, he is out of job ( at [employer] after 23 years) and can't even do activities around the house. Had SVT and Ablation- 2017- now  Atrial fib relapses, was not on anticoagulation and started.  Started metoprolol. Heart monitor found persistent a fib. He has a sore , dry throat and is always achy. Started fleccanide. Dr Alberteen Saurabh did CMET,TSH and Vit D after she found him Covid positive.  Those numbers looked (good  reportedly).  I reviewed the CPAP data: CPAP data shows 100% compliance with an average use at time of 9 hours and 19 minutes, 100% of these days the CPAP was used over 4 hours.  The CPAP 90 percentile pressure is 8.9 cmH2O and his residual AHI is only 1.5/h which shows of good resolution the minimum setting is 5 7 cmH2O the maximum setting is 12 cmH2O and the expiratory pressure relief is 3 cmH2O.  This seems to control the apnea very well.    12-06-2019He returns today for follow-up.  His download indicates that he uses machine 29 out of 30 days for compliance of 96%.  He uses machine other than 4 hours each night.  On average he uses his machine 8 hours and 9 minutes.  His residual AHI is 2.4.  He is currently on AutoSet with pressure 7 to 12 cm of water.  He does feel his mask leaking.  He has not changed out his supplies in over 6 months.  He returns today for evaluation.  His Epworth sleepiness score is 18, fatigue severity score is 63.  HISTORY Richard Walker reports that he lost his father in December 22, multiple health complications and finally lost his job, his wife also had to undergo several surgeries.  2018 was not an easy year for him.  He had seen [name] for a nasal septoplasty surgery it does seem to have helped with nasal patency now but he had some hiccups on the way including epistaxis, and it took a long time to see the mucous lining finally swelled down.  He is now able to use CPAP with good compliance again, his compliance is 90% plus average use of time is 7 hours and 12 minutes, he has used  Auto CPAP with a mean pressure of 8.4 cmH2O , the average AHI residual is 1.9/hr. , no changes in settings have to be done.  Minimum pressure is 7, maximum pressure is 12 cmH2O with 3 cm EPR ramp starts at 4 cmH2O.  He has seen his cardiologist, [name], who has treated him for SVTs by Ablation.  His tachycardia subsided for about 4 months but then after the ablation did come back.  Dr.  Ladona Ridgel suspect that there is another causative problem.  May be acid reflux, may be apnea -however his apnea should not be a risk factor at this successful resolution. Underwent  upper Gi/ gastroscopy with no abnormalities. He now presents for SOB, coughing and palpitation- he likes to use his CPAP as it helps to prevent tachycardia and palpitations. Chest X ray was taken on 1-7 and he stated he has not been given results. There is an Epic note, small lung volume old scarring.  described productive cough -?  Mucopurulent bronchitis ?[name] On doxicyline and mucinex.      REVIEW OF SYSTEMS: Out of a complete 14 system review of symptoms, the patient complains only of the following symptoms, and all other reviewed systems are negative.  See aboce, How likely are you to doze in the following situations: 0 = not likely, 1 = slight chance, 2 = moderate chance, 3 = high chance  Sitting and Reading? Watching Television? Sitting inactive in a public place (theater or meeting)? Lying down in the afternoon when circumstances permit? Sitting and talking to someone? Sitting quietly after lunch without alcohol? In a car, while stopped for a few minutes in traffic? As a passenger in a car for an hour without a break?  Total = 18     ALLERGIES: Allergies  Allergen Reactions   Pork-Derived Products Swelling   Statins     Muscle aches   Tree Extract Other (See Comments)    Intolerance     HOME MEDICATIONS: Outpatient Medications Prior to Visit  Medication Sig Dispense Refill   acetaminophen (TYLENOL) 325 MG tablet Take 2 tablets (650 mg total) by mouth every 6 (six) hours.     albuterol (PROAIR HFA) 108 (90 Base) MCG/ACT inhaler Inhale 1-2 puffs into the lungs every 6 (six) hours as needed for wheezing or shortness of breath. 24 g 3   azelastine (ASTELIN) 0.1 % nasal spray 1-2 sprays per nostril 2 times daily as needed. 90 mL 3   budesonide-formoterol (SYMBICORT) 160-4.5 MCG/ACT inhaler Inhale 2  puffs into the lungs in the morning and at bedtime. 3 each 3   cyclobenzaprine (FLEXERIL) 10 MG tablet Take 1 tablet (10 mg total) by mouth 3 (three) times daily as needed. 45 tablet 1   diclofenac (VOLTAREN) 75 MG EC tablet Take 1 tablet (75 mg total) by mouth 2 (two) times daily. 90 tablet 2   ezetimibe (ZETIA) 10 MG tablet Take 1 tablet (10 mg total) by mouth daily. 30 tablet 11   famotidine-calcium carbonate-magnesium hydroxide (PEPCID COMPLETE) 10-800-165 MG chewable tablet Chew by mouth.     fenofibrate (TRICOR) 145 MG tablet Take 1 tablet by mouth daily.     flecainide (TAMBOCOR) 150 MG tablet Take 1 tablet (150 mg total) by mouth 2 (two) times daily. 180 tablet 3   fluticasone (FLONASE) 50 MCG/ACT nasal spray Place 1 spray into both nostrils daily. 48 g 3   gabapentin (NEURONTIN) 100 MG capsule Take 1 capsule (100 mg total) by mouth 3 (three) times daily. 90 capsule 0   ibuprofen (ADVIL) 200 MG tablet Take 3 tablets (600 mg total) by mouth every 6 (six) hours.     levocetirizine (XYZAL) 5 MG tablet TAKE 1 TABLET(5 MG) BY MOUTH EVERY EVENING 90 tablet 2   Magnesium 250 MG TABS Take 1 tablet by mouth once a week.     meclizine (ANTIVERT) 25 MG tablet Take 1 tablet (25 mg total) by mouth 3 (three) times daily as needed for dizziness. 15 tablet 0   meloxicam (MOBIC) 15 MG tablet Take 1 tablet (15 mg total) by mouth daily. 30 tablet 1   metFORMIN (GLUCOPHAGE) 500 MG tablet Take 1 tablet (500 mg total) by mouth 2 (two) times daily with a meal. 60 tablet 3   metoprolol tartrate (LOPRESSOR) 25 MG tablet Take one tablet by mouth twice a day.  May take one additional tablet by mouth as needed for breakthrough palpitations. 270 tablet 3   montelukast (SINGULAIR) 10  MG tablet TAKE 1 TABLET BY MOUTH 1 TIME AT NIGHT FOR COUGHING OR WHEEZING 90 tablet 3   pantoprazole (PROTONIX) 40 MG tablet Take 1 tablet (40 mg total) by mouth 2 (two) times daily. 60 tablet 3   PRESCRIPTION MEDICATION Inhale into the  lungs at bedtime. CPAP     rivaroxaban (XARELTO) 20 MG TABS tablet Take 1 tablet (20 mg total) by mouth daily with supper. 90 tablet 3   Vitamin D, Ergocalciferol, (DRISDOL) 1.25 MG (50000 UNIT) CAPS capsule Take 1 capsule (50,000 Units total) by mouth every 7 (seven) days. 8 capsule 0   Alcaftadine 0.25 % SOLN Apply 1 drop to eye daily as needed (Allergic eye irritation). 5 mL 5   sildenafil (REVATIO) 20 MG tablet Take 1-2 tablets (20-40 mg total) by mouth as needed. 60 tablet 1   No facility-administered medications prior to visit.    PAST MEDICAL HISTORY: Past Medical History:  Diagnosis Date   DDD (degenerative disc disease), lumbar    L3/L4   Diverticulosis    GERD (gastroesophageal reflux disease)    Hyperlipidemia    Low sperm motility    Pre-diabetes    Sleep apnea    uses cpap    SVT (supraventricular tachycardia) (HCC)     PAST SURGICAL HISTORY: Past Surgical History:  Procedure Laterality Date   AMPUTATION Left 07/15/2018   Procedure: . REPAIR OF LEFT INDEX AND LONG FINGER BY FUSION /  REPAIR OF RING FINGER;  Surgeon: Mack Hook, MD;  Location: [facility];  Service: Orthopedics;  Laterality: Left;   BACK SURGERY     CARDIAC CATHETERIZATION N/A 12/22/2015   Procedure: Left Heart Cath and Coronary Angiography;  Surgeon: Peter M [place], MD;  Location: [facility] INVASIVE CV LAB;  Service: Cardiovascular;  Laterality: N/A;   CARDIAC ELECTROPHYSIOLOGY STUDY AND ABLATION     ELECTROPHYSIOLOGIC STUDY N/A 02/19/2016   Procedure: SVT Ablation;  Surgeon: Marinus Maw, MD;  Location: [facility] INVASIVE CV LAB;  Service: Cardiovascular;  Laterality: N/A;   septal reconstruction  2017   TURBINATE REDUCTION Bilateral 07/2016   WISDOM TOOTH EXTRACTION      FAMILY HISTORY: Family History  Problem Relation Age of Onset   Hypertension Father    Food Allergy Son    Colon cancer Neg Hx    Stomach cancer Neg Hx    Allergic rhinitis Neg Hx    Angioedema Neg Hx    Asthma Neg Hx    Eczema Neg Hx     Immunodeficiency Neg Hx    Urticaria Neg Hx     SOCIAL HISTORY: Social History   Socioeconomic History   Marital status: Married    Spouse name: Not on file   Number of children: Not on file   Years of education: Not on file   Highest education level: Not on file  Occupational History   Not on file  Tobacco Use   Smoking status: Former    Packs/day: 1.00    Years: 10.00    Pack years: 10.00    Types: Cigarettes    Quit date: 1992    Years since quitting: 30.8   Smokeless tobacco: Never   Tobacco comments:    Uses [employer] occasionally  Vaping Use   Vaping Use: Never used  Substance and Sexual Activity   Alcohol use: No   Drug use: No   Sexual activity: Yes  Other Topics Concern   Not on file  Social History Narrative   Marital Status:  Married Engineer, drilling)   Children:  4 (3 Sons/1 Daughter)   Pets:  None    Living Situation: Lives with spouse and 4 children   Origin:  He was born in [place].   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     Alcohol Use:  None   Drug Use:  None   Diet:  Regular   Exercise:  None   Hobbies:  Cards   Social Determinants of Health   Financial Resource Strain: Not on file  Food Insecurity: Not on file  Transportation Needs: Not on file  Physical Activity: Not on file  Stress: Not on file  Social Connections: Not on file  Intimate Partner Violence: Not on file      PHYSICAL EXAM  Vitals:   06/03/21 1403  BP: 135/75  Pulse: (!) 56  Weight: 248 lb (112.5 kg)  Height: 5\' 9"  (1.753 m)    Body mass index is 36.62 kg/m.  General: well developed, well nourished, very pleasant middle-age male, seated, in no evident distress Head: head normocephalic and atraumatic.   Neck: supple with no carotid or supraclavicular bruits Cardiovascular: regular rate and rhythm, no murmurs Musculoskeletal:  no deformity Skin:  no rash/petichiae Vascular:  Normal pulses all extremities   Neurologic Exam Mental Status: Awake and fully alert. Oriented to place and time. Recent and remote memory intact. Attention span, concentration and fund of knowledge appropriate. Mood and affect appropriate.  Cranial Nerves: Pupils equal, briskly reactive to light. Extraocular movements full without nystagmus. Visual fields full to confrontation. Hearing intact. Facial sensation intact. Face, tongue, palate moves normally and symmetrically.  Motor: Normal bulk and tone. Normal strength in all tested extremity muscles Sensory.: intact to touch , pinprick , position and vibratory sensation.  Coordination: Rapid alternating movements normal in all extremities. Finger-to-nose and heel-to-shin performed accurately bilaterally. Gait and Station: Arises from chair without difficulty. Stance is normal. Gait demonstrates normal stride length and balance without use of assistive device.  Reflexes: 1+ and symmetric. Toes downgoing.        DIAGNOSTIC DATA (LABS, IMAGING, TESTING) - I reviewed patient records, labs, notes, testing and imaging myself where available.  Lab Results  Component Value Date   WBC 7.2 01/23/2021   HGB 15.2 01/23/2021   HCT 45.2 01/23/2021   MCV 84.3 01/23/2021   PLT 192.0 01/23/2021      Component Value Date/Time   NA 138 04/08/2021 0804   K 4.2 04/08/2021 0804   CL 102 04/08/2021 0804   CO2 26 04/08/2021 0804   GLUCOSE 88 04/08/2021 0804   BUN 18 04/08/2021 0804   CREATININE 1.31 04/08/2021 0804   CREATININE 1.18 02/12/2016 1508   CALCIUM 9.6 04/08/2021 0804   PROT 6.6 04/08/2021 0804   ALBUMIN 4.0 04/08/2021 0804   AST 16 04/08/2021 0804   ALT 15 04/08/2021 0804   ALKPHOS 60 04/08/2021 0804   BILITOT 0.6 04/08/2021 0804   GFRNONAA 58 (L) 01/21/2021 1335   GFRNONAA >89 03/04/2014 1456   GFRAA >60 07/15/2018 1324   GFRAA >89 03/04/2014 1456   Lab Results  Component Value  Date   CHOL 190 04/08/2021   HDL 39.70 04/08/2021   LDLCALC 120 (H) 04/08/2021   LDLDIRECT 142.0 08/22/2018   TRIG 152.0 (H) 04/08/2021   CHOLHDL 5 04/08/2021   Lab Results  Component Value Date   HGBA1C 5.8 04/08/2021   Lab  Results  Component Value Date   VITAMINB12 330 04/15/2021   Lab Results  Component Value Date   TSH 1.47 04/15/2021      ASSESSMENT AND PLAN  1.  Obstructive sleep apnea on CPAP, well controlled. AHI of 0.1 with excellent compliance.  Received new machine 04/13/2021.  Advised to reach out to his DME company if he continues to have difficulty tolerating new mask.  He will continue to follow with DME company [employer] (supplies or CPAP related concerns    Follow-up in 1 year or call earlier if needed    CC:  Saguier, Ramon Dredge, PA-C   I spent 23 minutes of face-to-face and non-face-to-face time with patient.  This included previsit chart review, lab review, study review, order entry, electronic health record documentation, patient education and discussion regarding CPAP compliance report and obtaining new CPAP machine, new mask concerns and answered all other questions to patient satisfaction  Ihor Austin, [facility]  [facility] [address] [place], [place] [address]  Phone [phone] Fax [phone] Note: This document was prepared with digital dictation and possible smart phrase technology. Any transcriptional errors that result from this process are unintentional.

## 2021-06-10 ENCOUNTER — Other Ambulatory Visit (INDEPENDENT_AMBULATORY_CARE_PROVIDER_SITE_OTHER): Payer: Medicare Other

## 2021-06-10 ENCOUNTER — Other Ambulatory Visit: Payer: Self-pay

## 2021-06-10 DIAGNOSIS — E559 Vitamin D deficiency, unspecified: Secondary | ICD-10-CM | POA: Diagnosis not present

## 2021-06-10 LAB — VITAMIN D 25 HYDROXY (VIT D DEFICIENCY, FRACTURES): VITD: 32.33 ng/mL (ref 30.00–100.00)

## 2021-06-12 ENCOUNTER — Other Ambulatory Visit: Payer: Self-pay

## 2021-06-12 ENCOUNTER — Ambulatory Visit (INDEPENDENT_AMBULATORY_CARE_PROVIDER_SITE_OTHER): Payer: Medicare Other | Admitting: Medical

## 2021-06-12 VITALS — BP 116/76 | HR 66 | Temp 98.2°F | Resp 18 | Ht 69.0 in | Wt 247.0 lb

## 2021-06-12 DIAGNOSIS — E559 Vitamin D deficiency, unspecified: Secondary | ICD-10-CM | POA: Diagnosis not present

## 2021-06-12 DIAGNOSIS — I480 Paroxysmal atrial fibrillation: Secondary | ICD-10-CM | POA: Diagnosis not present

## 2021-06-12 DIAGNOSIS — R739 Hyperglycemia, unspecified: Secondary | ICD-10-CM

## 2021-06-12 DIAGNOSIS — I10 Essential (primary) hypertension: Secondary | ICD-10-CM

## 2021-06-12 DIAGNOSIS — N528 Other male erectile dysfunction: Secondary | ICD-10-CM

## 2021-06-12 MED ORDER — SILDENAFIL CITRATE 50 MG PO TABS: 50.0000 mg | ORAL_TABLET | Freq: Every day | ORAL | 0 refills | Status: DC | PRN

## 2021-06-12 NOTE — Progress Notes (Signed)
Subjective:    Patient ID: Richard Walker, male    DOB: [birth year], 65 y.o.   MRN: [id number]  HPI  Pt in for follow up.  Pt in for low vit d. He had repeat vit level about 7-8 weeks after staring recent 8 week rx. Level only came back about 10 point elevation. He admits some low sun exposure even in the summer.   Over past 5 years had low vit d.  Pt last 3 month blood sugar average came back in normal range.   Pt states he thinks occasional svt. He states brief 3 events over 8 weeks. Last time he felt was about 2 weeks ago. That lasted for about 60 minutes then subsided.   Pt has seen [name].    Review of Systems  Constitutional:  Negative for chills, fatigue and fever.  Respiratory:  Negative for cough, chest tightness, shortness of breath and wheezing.   Cardiovascular:  Negative for chest pain and palpitations.  Gastrointestinal:  Negative for abdominal pain and blood in stool.  Genitourinary:  Negative for difficulty urinating, flank pain, frequency, hematuria and testicular pain.  Musculoskeletal:  Negative for back pain, myalgias and neck stiffness.  Neurological:  Negative for dizziness and headaches.  Hematological:  Negative for adenopathy. Does not bruise/bleed easily.  Psychiatric/Behavioral:  Negative for behavioral problems and confusion.     Past Medical History:  Diagnosis Date   DDD (degenerative disc disease), lumbar    L3/L4   Diverticulosis    GERD (gastroesophageal reflux disease)    Hyperlipidemia    Low sperm motility    Pre-diabetes    Sleep apnea    uses cpap    SVT (supraventricular tachycardia) (HCC)      Social History   Socioeconomic History   Marital status: Married    Spouse name: Not on file   Number of children: Not on file   Years of education: Not on file   Highest education level: Not on file  Occupational History   Not on file  Tobacco Use   Smoking status: Former    Packs/day: 1.00    Years: 10.00    Pack years:  10.00    Types: Cigarettes    Quit date: 2    Years since quitting: 30.8   Smokeless tobacco: Never   Tobacco comments:    Uses Hookah Pipe occasionally  Vaping Use   Vaping Use: Never used  Substance and Sexual Activity   Alcohol use: No   Drug use: No   Sexual activity: Yes  Other Topics Concern   Not on file  Social History Narrative   Marital Status: Married Engineer, drilling)   Children:  4 (3 Sons/1 Daughter)   Pets:  None    Living Situation: Lives with spouse and 4 children   Origin:  He was born in [place].   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     Alcohol Use:  None   Drug Use:  None   Diet:  Regular   Exercise:  None   Hobbies:  Cards   Social Determinants of Health   Financial Resource Strain: Not on file  Food Insecurity: Not on file  Transportation Needs: Not on file  Physical Activity: Not on file  Stress: Not on file  Social Connections: Not on file  Intimate Partner Violence: Not on file    Past Surgical  History:  Procedure Laterality Date   AMPUTATION Left 07/15/2018   Procedure: . REPAIR OF LEFT INDEX AND LONG FINGER BY FUSION /  REPAIR OF RING FINGER;  Surgeon: Mack Hook, MD;  Location: [facility] OR;  Service: Orthopedics;  Laterality: Left;   BACK SURGERY     CARDIAC CATHETERIZATION N/A 12/22/2015   Procedure: Left Heart Cath and Coronary Angiography;  Surgeon: Peter M Swaziland, MD;  Location: [facility] INVASIVE CV LAB;  Service: Cardiovascular;  Laterality: N/A;   CARDIAC ELECTROPHYSIOLOGY STUDY AND ABLATION     ELECTROPHYSIOLOGIC STUDY N/A 02/19/2016   Procedure: SVT Ablation;  Surgeon: Marinus Maw, MD;  Location: [facility] INVASIVE CV LAB;  Service: Cardiovascular;  Laterality: N/A;   septal reconstruction  2017   TURBINATE REDUCTION Bilateral 07/2016   WISDOM TOOTH EXTRACTION      Family History  Problem Relation Age of  Onset   Hypertension Father    Food Allergy Son    Colon cancer Neg Hx    Stomach cancer Neg Hx    Allergic rhinitis Neg Hx    Angioedema Neg Hx    Asthma Neg Hx    Eczema Neg Hx    Immunodeficiency Neg Hx    Urticaria Neg Hx     Allergies  Allergen Reactions   Pork-Derived Products Swelling   Statins     Muscle aches   Tree Extract Other (See Comments)    Intolerance     Current Outpatient Medications on File Prior to Visit  Medication Sig Dispense Refill   acetaminophen (TYLENOL) 325 MG tablet Take 2 tablets (650 mg total) by mouth every 6 (six) hours.     albuterol (PROAIR HFA) 108 (90 Base) MCG/ACT inhaler Inhale 1-2 puffs into the lungs every 6 (six) hours as needed for wheezing or shortness of breath. 24 g 3   azelastine (ASTELIN) 0.1 % nasal spray 1-2 sprays per nostril 2 times daily as needed. 90 mL 3   budesonide-formoterol (SYMBICORT) 160-4.5 MCG/ACT inhaler Inhale 2 puffs into the lungs in the morning and at bedtime. 3 each 3   cyclobenzaprine (FLEXERIL) 10 MG tablet Take 1 tablet (10 mg total) by mouth 3 (three) times daily as needed. 45 tablet 1   diclofenac (VOLTAREN) 75 MG EC tablet Take 1 tablet (75 mg total) by mouth 2 (two) times daily. 90 tablet 2   ezetimibe (ZETIA) 10 MG tablet Take 1 tablet (10 mg total) by mouth daily. 30 tablet 11   famotidine-calcium carbonate-magnesium hydroxide (PEPCID COMPLETE) 10-800-165 MG chewable tablet Chew by mouth.     fenofibrate (TRICOR) 145 MG tablet Take 1 tablet by mouth daily.     flecainide (TAMBOCOR) 150 MG tablet Take 1 tablet (150 mg total) by mouth 2 (two) times daily. 180 tablet 3   fluticasone (FLONASE) 50 MCG/ACT nasal spray Place 1 spray into both nostrils daily. 48 g 3   gabapentin (NEURONTIN) 100 MG capsule Take 1 capsule (100 mg total) by mouth 3 (three) times daily. 90 capsule 0   ibuprofen (ADVIL) 200 MG tablet Take 3 tablets (600 mg total) by mouth every 6 (six) hours.     levocetirizine (XYZAL) 5 MG tablet  TAKE 1 TABLET(5 MG) BY MOUTH EVERY EVENING 90 tablet 2   Magnesium 250 MG TABS Take 1 tablet by mouth once a week.     meclizine (ANTIVERT) 25 MG tablet Take 1 tablet (25 mg total) by mouth 3 (three) times daily as needed for dizziness. 15 tablet 0  meloxicam (MOBIC) 15 MG tablet Take 1 tablet (15 mg total) by mouth daily. 30 tablet 1   metFORMIN (GLUCOPHAGE) 500 MG tablet Take 1 tablet (500 mg total) by mouth 2 (two) times daily with a meal. 60 tablet 3   metoprolol tartrate (LOPRESSOR) 25 MG tablet Take one tablet by mouth twice a day.  May take one additional tablet by mouth as needed for breakthrough palpitations. 270 tablet 3   montelukast (SINGULAIR) 10 MG tablet TAKE 1 TABLET BY MOUTH 1 TIME AT NIGHT FOR COUGHING OR WHEEZING 90 tablet 3   pantoprazole (PROTONIX) 40 MG tablet Take 1 tablet (40 mg total) by mouth 2 (two) times daily. 60 tablet 3   PRESCRIPTION MEDICATION Inhale into the lungs at bedtime. CPAP     rivaroxaban (XARELTO) 20 MG TABS tablet Take 1 tablet (20 mg total) by mouth daily with supper. 90 tablet 3   Vitamin D, Ergocalciferol, (DRISDOL) 1.25 MG (50000 UNIT) CAPS capsule Take 1 capsule (50,000 Units total) by mouth every 7 (seven) days. 8 capsule 0   No current facility-administered medications on file prior to visit.    BP 116/76   Pulse 66   Temp 98.2 F (36.8 C)   Resp 18   Ht 5\' 9"  (1.753 m)   Wt 247 lb (112 kg)   SpO2 98%   BMI 36.48 kg/m       Objective:   Physical Exam  General- No acute distress. Pleasant patient. Neck- Full range of motion, no jvd Lungs- Clear, even and unlabored. Heart- regular rate and rhythm. Neurologic- CNII- XII grossly intact.  Lower ext- no pedal edema.      Assessment & Plan:   Patient Instructions  PSVT and paroxysmal atrial fibrillation in the past.  Recent 3 events over the past 2 months or so with subjective SVT.  About 2 weeks ago most recent event lasted about 1 hour.  Then subsided.  Today's EKG shows normal  sinus rhythm.  Continue Xarelto and beta-blocker.  Your blood pressure is well controlled.  Low vitamin D historically.  Recent only minimal increase in vitamin D after about 7 to 8 weeks of weekly dose supplementation.  Your correlate vit d use with  recent increase of  SVT/palpitation.  I am unaware of correlation with vitamin D supplementation and  arrhythmia.  We will presently holding off on further vit d use  until we get advice from your cardiologist.  I will place referral to cardiologist office to see if they want to see you sooner for follow-up and possible echo.  If you.  MyChart your cardiologist really is willing asking his opinion regarding vitamin D supplementation and arrhythmia as well.  If any recurrent signficant  palpiations or persisting tachycardia then be seen in the ED during the interim.  Follow up in 3 weeks or sooner if needed   , PA-C   Time spent with patient today was  31 minutes which consisted of chart revdiew, discussing diagnosis, work up treatment and documentation.

## 2021-06-12 NOTE — Patient Instructions (Addendum)
PSVT and paroxysmal atrial fibrillation in the past.  Recent 3 events over the past 2 months or so with subjective SVT.  About 2 weeks ago most recent event lasted about 1 hour.  Then subsided.  Today's EKG shows normal sinus rhythm.  Continue Xarelto and beta-blocker.  Your blood pressure is well controlled.  Low vitamin D historically.  Recent only minimal increase in vitamin D after about 7 to 8 weeks of weekly dose supplementation.  Your correlate vit d use with  recent increase of  SVT/palpitation.  I am unaware of correlation with vitamin D supplementation and  arrhythmia.  We will presently holding off on further vit d use  until we get advice from your cardiologist.  I will place referral to cardiologist office to see if they want to see you sooner for follow-up and possible echo.  If you.  MyChart your cardiologist really is willing asking his opinion regarding vitamin D supplementation and arrhythmia as well.  If any recurrent signficant  palpiations or persisting tachycardia then be seen in the ED during the interim.  For elevated sugar continue metformin and low sugar diet.  For ED. I printed prescription of viagra.   Follow up in 3 weeks or sooner if needed

## 2021-06-14 NOTE — Addendum Note (Signed)
Addended by: Gwenevere Abbot on: 06/14/2021 02:53 PM   Modules accepted: Orders

## 2021-06-15 DIAGNOSIS — I480 Paroxysmal atrial fibrillation: Secondary | ICD-10-CM

## 2021-06-17 ENCOUNTER — Ambulatory Visit (INDEPENDENT_AMBULATORY_CARE_PROVIDER_SITE_OTHER): Payer: Medicare Other

## 2021-06-17 ENCOUNTER — Encounter: Payer: Self-pay | Admitting: Medical

## 2021-06-17 DIAGNOSIS — I480 Paroxysmal atrial fibrillation: Secondary | ICD-10-CM

## 2021-06-17 NOTE — Progress Notes (Unsigned)
Patient enrolled for [employer] to mail a 14 day ZIO XT monitor to his address on file. 

## 2021-06-18 MED ORDER — VITAMIN D (ERGOCALCIFEROL) 1.25 MG (50000 UNIT) PO CAPS: 50000.0000 [IU] | ORAL_CAPSULE | ORAL | 0 refills | Status: DC

## 2021-06-18 NOTE — Addendum Note (Signed)
Addended by: Gwenevere Abbot on: 06/18/2021 01:53 PM   Modules accepted: Orders

## 2021-06-22 ENCOUNTER — Encounter: Payer: Self-pay | Admitting: Medical

## 2021-06-22 NOTE — Telephone Encounter (Signed)
Can patient pick this up OTC, I dont see a PA for him

## 2021-06-24 DIAGNOSIS — I480 Paroxysmal atrial fibrillation: Secondary | ICD-10-CM

## 2021-06-29 ENCOUNTER — Telehealth: Payer: Self-pay | Admitting: Medical

## 2021-06-29 ENCOUNTER — Telehealth: Payer: Self-pay | Admitting: *Deleted

## 2021-06-29 NOTE — Telephone Encounter (Signed)
Initial 14 day ZIO XT monitor fell off patient in 72 hours.  Would we like to authorize a replacement monitor be sent to patient.  Reference # [id number]. Response, yes, please send patient a replacement 14 day ZIO XT monitor.

## 2021-06-29 NOTE — Telephone Encounter (Signed)
Patient called and informed [employer] will be mailing our a replacment 14 day ZIO XT monitor.  Please mail back first monitor so they may process any data recorded.  Please do not shower for the first 24 hours after applying the monitor.  Patient stated he did shower twice the first day he applied his last monitor, which may have contributed to it falling off prematurely.

## 2021-06-29 NOTE — Telephone Encounter (Signed)
Appeal form faxed to medicare

## 2021-06-29 NOTE — Telephone Encounter (Signed)
Pt dropped off document to be filled out by provider (Medication appeal request) Pt was informed will possibly be recommended for an appt- pt understood, pt would like to be called when document ready, if any question please contact pt at [phone]. Document put at front office tray under providers name.

## 2021-07-02 ENCOUNTER — Ambulatory Visit: Payer: Self-pay | Admitting: Adult Health

## 2021-07-06 ENCOUNTER — Other Ambulatory Visit: Payer: Self-pay | Admitting: Medical

## 2021-07-12 ENCOUNTER — Encounter: Payer: Self-pay | Admitting: Medical

## 2021-07-13 MED ORDER — FENOFIBRATE 145 MG PO TABS: 145.0000 mg | ORAL_TABLET | Freq: Every day | ORAL | 3 refills | Status: DC

## 2021-07-23 ENCOUNTER — Encounter: Payer: Self-pay | Admitting: Internal Medicine

## 2021-07-24 NOTE — Telephone Encounter (Signed)
Called patient and discussed.

## 2021-07-28 ENCOUNTER — Encounter: Payer: Self-pay | Admitting: Medical

## 2021-07-29 ENCOUNTER — Ambulatory Visit (INDEPENDENT_AMBULATORY_CARE_PROVIDER_SITE_OTHER): Payer: Medicare Other

## 2021-07-29 DIAGNOSIS — Z23 Encounter for immunization: Secondary | ICD-10-CM | POA: Diagnosis not present

## 2021-08-03 ENCOUNTER — Encounter: Payer: Self-pay | Admitting: Internal Medicine

## 2021-08-21 ENCOUNTER — Encounter: Payer: Self-pay | Admitting: Medical

## 2021-08-21 ENCOUNTER — Ambulatory Visit
Admission: EM | Admit: 2021-08-21 | Discharge: 2021-08-21 | Disposition: A | Discharge status: Home / Self Care | Payer: Medicare Other | Attending: Emergency Medicine | Admitting: Emergency Medicine

## 2021-08-21 ENCOUNTER — Other Ambulatory Visit: Payer: Self-pay

## 2021-08-21 DIAGNOSIS — B9789 Other viral agents as the cause of diseases classified elsewhere: Secondary | ICD-10-CM

## 2021-08-21 DIAGNOSIS — J988 Other specified respiratory disorders: Secondary | ICD-10-CM

## 2021-08-21 LAB — POCT INFLUENZA A/B
Influenza A, POC: NEGATIVE
Influenza B, POC: NEGATIVE

## 2021-08-21 MED ORDER — GUAIFENESIN 400 MG PO TABS
ORAL_TABLET | ORAL | 0 refills | Status: DC
Start: 2021-08-21 — End: 2022-11-25

## 2021-08-21 MED ORDER — IBUPROFEN 600 MG PO TABS: 600.0000 mg | ORAL_TABLET | Freq: Three times a day (TID) | ORAL | 0 refills | Status: DC | PRN

## 2021-08-21 MED ORDER — PROMETHAZINE-DM 6.25-15 MG/5ML PO SYRP: 5.0000 mL | ORAL_SOLUTION | Freq: Four times a day (QID) | ORAL | 0 refills | Status: DC | PRN

## 2021-08-21 NOTE — Discharge Instructions (Addendum)
Your rapid flu test today is negative.  Your COVID test is pending, results should be complete within the next 24 to 48 hours.  The result will be posted to your MyChart.  If it is positive, you will be contacted by phone.  At this time, antiviral therapy is not recommended or indicated.  At this time, antibiotics are not recommended or indicated.  Conservative care is also recommended at this time.  This includes rest, pushing clear fluids and activity as tolerated.  Warm beverages such as teas and broths versus cold beverages/popsicles and frozen sherbet/sorbet are personal choice, both warm and cold are beneficial.  You may also notice that your appetite is reduced; this is okay as long as you are drinking plenty of clear fluids.    Please see the list below for recommended medications, dosages and frequencies to provide relief of your current symptoms:     Ibuprofen  (Advil, Motrin): This is a good anti-inflammatory medication which addresses aches and pains and inflammation of the upper airways that causes sinus and nasal congestion as well as in the lower airways which makes your cough feel tight and sometimes burn.  I recommend that you take between 400 to 600 mg every 6-8 hours as needed.      Guaifenesin (Robitussin, Mucinex): This is an expectorant.  This helps break up chest congestion and loosen up thick nasal drainage making phlegm and drainage more liquid and therefore easier to remove.  I recommend being 400 mg three times daily as needed.      Promethazine DM: Promethazine is both the nasal decongestant and an antinausea medication that makes most patients feel fairly sleepy.  The DM is dextromethorphan, a cough suppressant found many over-the-counter cough medications.  Please take 5 mL before bedtime to help you sleep better, minimize your cough.  I have provided you with a prescription for this medication.      Please also continue Flonase and azelastine nasal sprays, albuterol,  Symbicort, and Singulair as prescribed.      Please remain home from work, school, public places until you have been fever free for 24 hours without the use of antifever medications such as Tylenol or ibuprofen.    Please follow-up within the next 3 to 5 days either with your primary care provider or urgent care if your symptoms do not resolve.  If you do not have a primary care provider, we will assist you in finding one.

## 2021-08-21 NOTE — ED Triage Notes (Signed)
Pt states he has a productive cough, fatigue, sore throat, and body aches that began yesterday.

## 2021-08-21 NOTE — ED Provider Notes (Signed)
[facility]    CSN: [id number] Arrival date & time: 08/21/21  [id number]    HISTORY  No chief complaint on file.  HPI Richard Walker is a 65 y.o. male. Pt states he has a productive cough, fatigue, sore throat, and body aches that began yesterday, states this morning he felt he was wheezing.  Patient reports having had flu vaccine 3 weeks ago, states he received both COVID vaccines but has not had any boosters.  Patient reports COVID infection in August 2020.  Patient states the pain and burning in his throat radiates to both ears.  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  The history is provided by the patient.  Past Medical History:  Diagnosis Date   DDD (degenerative disc disease), lumbar    L3/L4   Diverticulosis    GERD (gastroesophageal reflux disease)    Hyperlipidemia    Low sperm motility    Pre-diabetes    Sleep apnea    uses cpap    SVT (supraventricular tachycardia) ([place])    Patient Active Problem List   Diagnosis Date Noted   OSA on CPAP 02/19/2021   Bite, snake 01/28/2021   Neck pain 11/13/2020   Vertigo 11/13/2020   Hypertension 11/03/2020   Contusion of bone 10/23/2020   Primary osteoarthritis of left knee 10/23/2020   Concussion with no loss of consciousness 10/23/2020   Paroxysmal atrial fibrillation ([place]) 05/22/2019   Unspecified atrial flutter ([place]) 05/22/2019   Pain in right foot 03/30/2018   Perennial and seasonal allergic rhinitis 11/09/2017   History of food allergy 11/09/2017   Wheezing 11/09/2017   Dyspnea/wheezing 10/31/2017   Cough 10/31/2017   Bronchitis, mucopurulent recurrent ([place]) 09/07/2017   SVT (supraventricular tachycardia) ([place]) 04/23/2015   Abdominal pain, epigastric 06/03/2013   Unspecified vitamin D deficiency 04/09/2013   Esophageal reflux 04/09/2013   Hepatitis B carrier ([place]) 04/09/2013   Other and unspecified hyperlipidemia 04/09/2013   Unspecified sleep  apnea 04/09/2013   Past Surgical History:  Procedure Laterality Date   AMPUTATION Left 07/15/2018   Procedure: . REPAIR OF LEFT INDEX AND LONG FINGER BY FUSION /  REPAIR OF RING FINGER;  Surgeon: Milly Jakob, MD;  Location: [facility];  Service: Orthopedics;  Laterality: Left;   BACK SURGERY     CARDIAC CATHETERIZATION N/A 12/22/2015   Procedure: Left Heart Cath and Coronary Angiography;  Surgeon: Peter M Martinique, MD;  Location: [facility];  Service: Cardiovascular;  Laterality: N/A;   CARDIAC ELECTROPHYSIOLOGY STUDY AND ABLATION     ELECTROPHYSIOLOGIC STUDY N/A 02/19/2016   Procedure: SVT Ablation;  Surgeon: Evans Lance, MD;  Location: [facility];  Service: Cardiovascular;  Laterality: N/A;   septal reconstruction  2017   TURBINATE REDUCTION Bilateral 07/2016   WISDOM TOOTH EXTRACTION      Home Medications    Prior to Admission medications   Medication Sig Start Date End Date Taking? Authorizing Provider  acetaminophen (TYLENOL) 325 MG tablet Take 2 tablets (650 mg total) by mouth every 6 (six) hours. 07/15/18   Milly Jakob, MD  albuterol [facility] HFA) [phone] Base) MCG/ACT inhaler Inhale 1-2 puffs into the lungs every 6 (six) hours as needed for wheezing or shortness of breath. 04/06/21   Chesley Mires, MD  azelastine (ASTELIN) 0.1 % nasal spray 1-2 sprays per nostril 2 times daily as needed. 04/06/21   Chesley Mires, MD  budesonide-formoterol [facility]) 160-4.5 MCG/ACT inhaler  Inhale 2 puffs into the lungs in the morning and at bedtime. 04/06/21   Chesley Mires, MD  cyclobenzaprine (FLEXERIL) 10 MG tablet Take 1 tablet (10 mg total) by mouth 3 (three) times daily as needed. 05/26/21   Rosemarie Ax, MD  diclofenac (VOLTAREN) 75 MG EC tablet Take 1 tablet (75 mg total) by mouth 2 (two) times daily. 01/14/20   Edrick Kins, DPM  ezetimibe (ZETIA) 10 MG tablet Take 1 tablet (10 mg total) by mouth daily. 07/25/18   Saguier, Percell Miller, PA-C  famotidine-calcium  carbonate-magnesium hydroxide (PEPCID COMPLETE) 10-800-165 MG chewable tablet Chew by mouth.    [provider]  fenofibrate (TRICOR) 145 MG tablet Take 1 tablet (145 mg total) by mouth daily. 07/13/21 10/11/21  Saguier, Percell Miller, PA-C  flecainide (TAMBOCOR) 150 MG tablet Take 1 tablet (150 mg total) by mouth 2 (two) times daily. 11/03/20   Evans Lance, MD  fluticasone (FLONASE) 50 MCG/ACT nasal spray Place 1 spray into both nostrils daily. 04/06/21   Chesley Mires, MD  gabapentin (NEURONTIN) 100 MG capsule Take 1 capsule (100 mg total) by mouth 3 (three) times daily. 01/23/21   Saguier, Percell Miller, PA-C  ibuprofen (ADVIL) 200 MG tablet Take 3 tablets (600 mg total) by mouth every 6 (six) hours. 07/15/18   Milly Jakob, MD  levocetirizine (XYZAL) 5 MG tablet TAKE 1 TABLET(5 MG) BY MOUTH EVERY EVENING 04/06/21   Chesley Mires, MD  Magnesium 250 MG TABS Take 1 tablet by mouth once a week.    [provider]  meclizine (ANTIVERT) 25 MG tablet Take 1 tablet (25 mg total) by mouth 3 (three) times daily as needed for dizziness. 12/05/20   Veryl Speak, MD  meloxicam (MOBIC) 15 MG tablet Take 1 tablet (15 mg total) by mouth daily. 01/17/19   Edrick Kins, DPM  metFORMIN (GLUCOPHAGE) 500 MG tablet TAKE 1 TABLET(500 MG) BY MOUTH TWICE DAILY WITH A MEAL 07/06/21   Saguier, Percell Miller, PA-C  metoprolol tartrate (LOPRESSOR) 25 MG tablet Take one tablet by mouth twice a day.  May take one additional tablet by mouth as needed for breakthrough palpitations. 11/03/20   Evans Lance, MD  montelukast (SINGULAIR) 10 MG tablet TAKE 1 TABLET BY MOUTH 1 TIME AT NIGHT FOR COUGHING OR WHEEZING 04/06/21   Chesley Mires, MD  pantoprazole (PROTONIX) 40 MG tablet TAKE 1 TABLET(40 MG) BY MOUTH TWICE DAILY 07/06/21   Saguier, Percell Miller, PA-C  PRESCRIPTION MEDICATION Inhale into the lungs at bedtime. CPAP    [provider]  rivaroxaban (XARELTO) 20 MG TABS tablet Take 1 tablet (20 mg total) by mouth daily with  supper. 11/03/20   Evans Lance, MD  sildenafil (VIAGRA) 50 MG tablet Take 1 tablet (50 mg total) by mouth daily as needed for erectile dysfunction. 06/12/21   Saguier, Percell Miller, PA-C  Vitamin D, Ergocalciferol, (DRISDOL) 1.25 MG (50000 UNIT) CAPS capsule Take 1 capsule (50,000 Units total) by mouth every 7 (seven) days. 04/16/21   Saguier, Percell Miller, PA-C  Vitamin D, Ergocalciferol, (DRISDOL) 1.25 MG (50000 UNIT) CAPS capsule Take 1 capsule (50,000 Units total) by mouth every 7 (seven) days. 06/18/21   Saguier, Percell Miller, PA-C   Family History Family History  Problem Relation Age of Onset   Hypertension Father    Food Allergy Son    Colon cancer Neg Hx    Stomach cancer Neg Hx    Allergic rhinitis Neg Hx    Angioedema Neg Hx    Asthma Neg Hx  Eczema Neg Hx    Immunodeficiency Neg Hx    Urticaria Neg Hx    Social History Social History   Tobacco Use   Smoking status: Former    Packs/day: 1.00    Years: 10.00    Pack years: 10.00    Types: Cigarettes    Quit date: 1992    Years since quitting: 31.0   Smokeless tobacco: Never   Tobacco comments:    Uses [employer] occasionally  Vaping Use   Vaping Use: Never used  Substance Use Topics   Alcohol use: No   Drug use: No   Allergies   Pork-derived products, Statins, and Tree extract  Review of Systems Review of Systems Pertinent findings noted in history of present illness.   Physical Exam Triage Vital Signs ED Triage Vitals  Enc Vitals Group     BP 06/19/21 0827 (!) 147/82     Pulse Rate 06/19/21 0827 72     Resp 06/19/21 0827 18     Temp 06/19/21 0827 98.3 F (36.8 C)     Temp Source 06/19/21 0827 Oral     SpO2 06/19/21 0827 98 %     Weight --      Height --      Head Circumference --      Peak Flow --      Pain Score 06/19/21 0826 5     Pain Loc --      Pain Edu? --      Excl. in GC? --   No data found.  Updated Vital Signs BP 116/78 (BP Location: Right Arm)    Pulse 60    Temp 98.1 F (36.7 C) (Oral)     Resp 20    SpO2 96%   Physical Exam Constitutional:      Appearance: He is ill-appearing.  HENT:     Head: Normocephalic and atraumatic.     Salivary Glands: Right salivary gland is not diffusely enlarged or tender. Left salivary gland is not diffusely enlarged or tender.     Right Ear: Tympanic membrane, ear canal and external ear normal.     Left Ear: Tympanic membrane, ear canal and external ear normal.     Nose: Mucosal edema, congestion and rhinorrhea present. Rhinorrhea is clear.     Right Sinus: No maxillary sinus tenderness or frontal sinus tenderness.     Left Sinus: No maxillary sinus tenderness or frontal sinus tenderness.     Mouth/Throat:     Mouth: Mucous membranes are moist.     Pharynx: Posterior oropharyngeal erythema present. No pharyngeal swelling or uvula swelling.     Tonsils: No tonsillar exudate. 0 on the right. 0 on the left.  Cardiovascular:     Rate and Rhythm: Normal rate and regular rhythm.     Pulses: Normal pulses.  Pulmonary:     Effort: Pulmonary effort is normal. No accessory muscle usage, prolonged expiration or respiratory distress.     Breath sounds: No stridor. No wheezing, rhonchi or rales.  Abdominal:     General: Abdomen is flat. Bowel sounds are normal.     Palpations: Abdomen is soft.  Musculoskeletal:        General: Normal range of motion.  Lymphadenopathy:     Cervical: Cervical adenopathy present.     Right cervical: Superficial cervical adenopathy present. No posterior cervical adenopathy.    Left cervical: Superficial cervical adenopathy present. No posterior cervical adenopathy.  Skin:    General: Skin is  warm and dry.  Neurological:     General: No focal deficit present.     Mental Status: He is alert and oriented to person, place, and time.     Motor: Motor function is intact.     Coordination: Coordination is intact.     Gait: Gait is intact.     Deep Tendon Reflexes: Reflexes are normal and symmetric.  Psychiatric:         Attention and Perception: Attention and perception normal.        Mood and Affect: Mood and affect normal.        Speech: Speech normal.        Behavior: Behavior normal. Behavior is cooperative.        Thought Content: Thought content normal.    Visual Acuity Right Eye Distance:   Left Eye Distance:   Bilateral Distance:    Right Eye Near:   Left Eye Near:    Bilateral Near:     UC Couse / Diagnostics / Procedures:    EKG  Radiology No results found.  Procedures Procedures (including critical care time)  UC Diagnoses / Final Clinical Impressions(s)   I have reviewed the triage vital signs and the nursing notes.  Pertinent labs & imaging results that were available during my care of the patient were reviewed by me and considered in my medical decision making (see chart for details).   Final diagnoses:  Viral respiratory illness   Physical exam is unremarkable.  Rapid flu test is negative, COVID test is pending.  Do not recommend antivirals at this time.  Conservative care recommended.  See below for discharge plan.  ED Prescriptions     Medication Sig Dispense Auth. Provider   ibuprofen (ADVIL) 600 MG tablet Take 1 tablet (600 mg total) by mouth every 8 (eight) hours as needed for up to 30 doses for fever, headache, mild pain or moderate pain (Inflammation). Take 1 tablet 3 times daily as needed for inflammation of upper airways and/or pain. 30 tablet Theadora RamaMorgan, Kimari Coudriet Scales, PA-C   promethazine-dextromethorphan (PROMETHAZINE-DM) 6.25-15 MG/5ML syrup Take 5 mLs by mouth 4 (four) times daily as needed for cough. 180 mL Theadora RamaMorgan, Gabriella Woodhead Scales, PA-C   guaifenesin (HUMIBID E) 400 MG TABS tablet Take 1 tablet 3 times daily as needed for chest congestion and cough 21 tablet Theadora RamaMorgan, Cataleya Cristina Scales, PA-C      PDMP not reviewed this encounter.  Pending results:  Labs Reviewed  NOVEL CORONAVIRUS, NAA  POCT INFLUENZA A/B    Medications Ordered in UC: Medications - No data to  display  Disposition Upon Discharge:  Condition: stable for discharge home Home: take medications as prescribed; routine discharge instructions as discussed; follow up as advised.  Patient presented with an acute illness with associated systemic symptoms and significant discomfort requiring urgent management. In my opinion, this is a condition that a prudent lay person (someone who possesses an average knowledge of health and medicine) may potentially expect to result in complications if not addressed urgently such as respiratory distress, impairment of bodily function or dysfunction of bodily organs.   Routine symptom specific, illness specific and/or disease specific instructions were discussed with the patient and/or caregiver at length.   As such, the patient has been evaluated and assessed, work-up was performed and treatment was provided in alignment with urgent care protocols and evidence based medicine.  Patient/parent/caregiver has been advised that the patient may require follow up for further testing and treatment if the symptoms continue  in spite of treatment, as clinically indicated and appropriate.  If the patient was tested for COVID-19, Influenza and/or RSV, then the patient/parent/guardian was advised to isolate at home pending the results of his/her diagnostic coronavirus test and potentially longer if theyre positive. I have also advised pt that if his/her COVID-19 test returns positive, it's recommended to self-isolate for at least 10 days after symptoms first appeared AND until fever-free for 24 hours without fever reducer AND other symptoms have improved or resolved. Discussed self-isolation recommendations as well as instructions for household member/close contacts as per the [facility] and [place] DHHS, and also gave patient the [place] packet with this information.  Patient/parent/caregiver has been advised to return to the [facility] or PCP in 3-5 days if no better; to PCP or the Emergency  Department if new signs and symptoms develop, or if the current signs or symptoms continue to change or worsen for further workup, evaluation and treatment as clinically indicated and appropriate  The patient will follow up with their current PCP if and as advised. If the patient does not currently have a PCP we will assist them in obtaining one.   The patient may need specialty follow up if the symptoms continue, in spite of conservative treatment and management, for further workup, evaluation, consultation and treatment as clinically indicated and appropriate.  Patient/parent/caregiver verbalized understanding and agreement of plan as discussed.  All questions were addressed during visit.  Please see discharge instructions below for further details of plan.  Discharge Instructions:   Discharge Instructions      Your rapid flu test today is negative.  Your COVID test is pending, results should be complete within the next 24 to 48 hours.  The result will be posted to your MyChart.  If it is positive, you will be contacted by phone.  At this time, antiviral therapy is not recommended or indicated.  At this time, antibiotics are not recommended or indicated.  Conservative care is also recommended at this time.  This includes rest, pushing clear fluids and activity as tolerated.  Warm beverages such as teas and broths versus cold beverages/popsicles and frozen sherbet/sorbet are personal choice, both warm and cold are beneficial.  You may also notice that your appetite is reduced; this is okay as long as you are drinking plenty of clear fluids.    Please see the list below for recommended medications, dosages and frequencies to provide relief of your current symptoms:     Ibuprofen  (Advil, Motrin): This is a good anti-inflammatory medication which addresses aches and pains and inflammation of the upper airways that causes sinus and nasal congestion as well as in the lower airways which makes your  cough feel tight and sometimes burn.  I recommend that you take between 400 to 600 mg every 6-8 hours as needed.      Guaifenesin (Robitussin, Mucinex): This is an expectorant.  This helps break up chest congestion and loosen up thick nasal drainage making phlegm and drainage more liquid and therefore easier to remove.  I recommend being 400 mg three times daily as needed.      Promethazine DM: Promethazine is both the nasal decongestant and an antinausea medication that makes most patients feel fairly sleepy.  The DM is dextromethorphan, a cough suppressant found many over-the-counter cough medications.  Please take 5 mL before bedtime to help you sleep better, minimize your cough.  I have provided you with a prescription for this medication.      Please  also continue Flonase and azelastine nasal sprays, albuterol, Symbicort, and Singulair as prescribed.      Please remain home from work, school, public places until you have been fever free for 24 hours without the use of antifever medications such as Tylenol or ibuprofen.    Please follow-up within the next 3 to 5 days either with your primary care provider or urgent care if your symptoms do not resolve.  If you do not have a primary care provider, we will assist you in finding one.       This office note has been dictated using Museum/gallery curator.  Unfortunately, and despite my best efforts, this method of dictation can sometimes lead to occasional typographical or grammatical errors.  I apologize in advance if this occurs.      Lynden Oxford Scales, [place] 08/21/21 [phone]

## 2021-08-26 ENCOUNTER — Ambulatory Visit (INDEPENDENT_AMBULATORY_CARE_PROVIDER_SITE_OTHER): Payer: Medicare Other | Admitting: Medical

## 2021-08-26 VITALS — BP 126/69 | HR 56 | Temp 97.3°F | Resp 18 | Ht 69.0 in | Wt 247.0 lb

## 2021-08-26 DIAGNOSIS — J069 Acute upper respiratory infection, unspecified: Secondary | ICD-10-CM | POA: Diagnosis not present

## 2021-08-26 DIAGNOSIS — R059 Cough, unspecified: Secondary | ICD-10-CM | POA: Diagnosis not present

## 2021-08-26 LAB — NOVEL CORONAVIRUS, NAA: SARS-CoV-2, NAA: NOT DETECTED

## 2021-08-26 LAB — POC COVID19 BINAXNOW: SARS Coronavirus 2 Ag: NEGATIVE

## 2021-08-26 MED ORDER — HYDROCODONE BIT-HOMATROP MBR 5-1.5 MG/5ML PO SOLN: 5.0000 mL | Freq: Three times a day (TID) | ORAL | 0 refills | Status: DC | PRN

## 2021-08-26 MED ORDER — AZITHROMYCIN 250 MG PO TABS: ORAL_TABLET | ORAL | 0 refills | Status: AC

## 2021-08-26 MED ORDER — FLUTICASONE PROPIONATE 50 MCG/ACT NA SUSP: 2.0000 | Freq: Every day | NASAL | 1 refills | Status: DC

## 2021-08-26 NOTE — Progress Notes (Signed)
Subjective:    Patient ID: Richard Walker, male    DOB: June 17, 1956, 66 y.o.   MRN: [id number]  HPI  Pt states that on Friday he felt sick.  Hpi in ED. "[name] is a 66 y.o. male. Pt states he has a productive cough, fatigue, sore throat, and body aches that began yesterday, states this morning he felt he was wheezing.  Patient reports having had flu vaccine 3 weeks ago, states he received both COVID vaccines but has not had any boosters.  Patient reports COVID infection in August 2020.  Patient states the pain and burning in his throat radiates to both ears.  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  Condition: stable for discharge home Home: take medications as prescribed; routine discharge instructions as discussed; follow up as advised.   Patient presented with an acute illness with associated systemic symptoms and significant discomfort requiring urgent management. In my opinion, this is a condition that a prudent lay person (someone who possesses an average knowledge of health and medicine) may potentially expect to result in complications if not addressed urgently such as respiratory distress, impairment of bodily function or dysfunction of bodily organs.    Routine symptom specific, illness specific and/or disease specific instructions were discussed with the patient and/or caregiver at length.    As such, the patient has been evaluated and assessed, work-up was performed and treatment was provided in alignment with urgent care protocols and evidence based medicine.  Patient/parent/caregiver has been advised that the patient may require follow up for further testing and treatment if the symptoms continue in spite of treatment, as clinically indicated and appropriate.  If the patient was tested for COVID-19, Influenza and/or RSV, then the patient/parent/guardian was advised to isolate at home  pending the results of his/her diagnostic coronavirus test and potentially longer if theyre positive. I have also advised pt that if his/her COVID-19 test returns positive, it's recommended to self-isolate for at least 10 days after symptoms first appeared AND until fever-free for 24 hours without fever reducer AND other symptoms have improved or resolved. Discussed self-isolation recommendations as well as instructions for household member/close contacts as per the CDC and  DHHS, and also gave patient the COVID packet with this information."   He went to ED. He had negative flu and negative covid test.  On review pcr covid is still pending?   Given promethazine dm. He is still coughing at night.  Also on humbid    No cxr done.  Review of Systems  Constitutional:  Negative for chills, fatigue and fever.  HENT:  Negative for congestion and dental problem.   Respiratory:  Positive for cough. Negative for chest tightness, shortness of breath and wheezing.   Cardiovascular:  Negative for chest pain and palpitations.  Gastrointestinal:  Negative for abdominal pain, blood in stool and constipation.  Musculoskeletal:  Negative for arthralgias and back pain.  Psychiatric/Behavioral:  Negative for behavioral problems and confusion.     Past Medical History:  Diagnosis Date   DDD (degenerative disc disease), lumbar    L3/L4   Diverticulosis    GERD (gastroesophageal reflux disease)    Hyperlipidemia    Low sperm motility    Pre-diabetes    Sleep apnea    uses cpap    SVT (supraventricular tachycardia) (HCC)      Social History   Socioeconomic History   Marital  status: Married    Spouse name: Not on file   Number of children: Not on file   Years of education: Not on file   Highest education level: Not on file  Occupational History   Not on file  Tobacco Use   Smoking status: Former    Packs/day: 1.00    Years: 10.00    Pack years: 10.00    Types: Cigarettes    Quit date:  51992    Years since quitting: 31.0   Smokeless tobacco: Never   Tobacco comments:    Uses Hookah Pipe occasionally  Vaping Use   Vaping Use: Never used  Substance and Sexual Activity   Alcohol use: No   Drug use: No   Sexual activity: Yes  Other Topics Concern   Not on file  Social History Narrative   Marital Status: Married Engineer, 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     Alcohol Use:  None   Drug Use:  None   Diet:  Regular   Exercise:  None   Hobbies:  Cards   Social Determinants of Health   Financial Resource Strain: Not on file  Food Insecurity: Not on file  Transportation Needs: Not on file  Physical Activity: Not on file  Stress: Not on file  Social Connections: Not on file  Intimate Partner Violence: Not on file    Past Surgical History:  Procedure Laterality Date   AMPUTATION Left 07/15/2018   Procedure: . REPAIR OF LEFT INDEX AND LONG FINGER BY FUSION /  REPAIR OF RING FINGER;  Surgeon: Mack Hookhompson, David, MD;  [facility];  Service: Orthopedics;  Laterality: Left;   BACK SURGERY     CARDIAC CATHETERIZATION N/A 12/22/2015   Procedure: Left Heart Cath and Coronary Angiography;  Surgeon: Peter M SwazilandJordan, MD;  [facility];  Service: Cardiovascular;  Laterality: N/A;   CARDIAC ELECTROPHYSIOLOGY STUDY AND ABLATION     ELECTROPHYSIOLOGIC STUDY N/A 02/19/2016   Procedure: SVT Ablation;  Surgeon: Marinus MawGregg W Taylor, MD;  [facility];  Service: Cardiovascular;  Laterality: N/A;   septal reconstruction  2017   TURBINATE REDUCTION Bilateral 07/2016   WISDOM TOOTH EXTRACTION      Family History  Problem Relation Age of Onset   Hypertension Father    Food Allergy  Son    Colon cancer Neg Hx    Stomach cancer Neg Hx    Allergic rhinitis Neg Hx    Angioedema Neg Hx    Asthma Neg Hx    Eczema Neg Hx    Immunodeficiency Neg Hx    Urticaria Neg Hx     Allergies  Allergen Reactions   Pork-Derived Products Swelling   Statins     Muscle aches   Tree Extract Other (See Comments)    Intolerance     Current Outpatient Medications on File Prior to Visit  Medication Sig Dispense Refill   acetaminophen (TYLENOL) 325 MG tablet Take 2 tablets (650 mg total) by mouth every 6 (six) hours.     albuterol (PROAIR HFA) 108 (90 Base) MCG/ACT inhaler Inhale 1-2 puffs into the lungs every 6 (six) hours as needed for wheezing or  shortness of breath. 24 g 3   azelastine (ASTELIN) 0.1 % nasal spray 1-2 sprays per nostril 2 times daily as needed. 90 mL 3   budesonide-formoterol (SYMBICORT) 160-4.5 MCG/ACT inhaler Inhale 2 puffs into the lungs in the morning and at bedtime. 3 each 3   cyclobenzaprine (FLEXERIL) 10 MG tablet Take 1 tablet (10 mg total) by mouth 3 (three) times daily as needed. 45 tablet 1   diclofenac (VOLTAREN) 75 MG EC tablet Take 1 tablet (75 mg total) by mouth 2 (two) times daily. 90 tablet 2   ezetimibe (ZETIA) 10 MG tablet Take 1 tablet (10 mg total) by mouth daily. 30 tablet 11   famotidine-calcium carbonate-magnesium hydroxide (PEPCID COMPLETE) 10-800-165 MG chewable tablet Chew by mouth.     fenofibrate (TRICOR) 145 MG tablet Take 1 tablet (145 mg total) by mouth daily. 90 tablet 3   flecainide (TAMBOCOR) 150 MG tablet Take 1 tablet (150 mg total) by mouth 2 (two) times daily. 180 tablet 3   fluticasone (FLONASE) 50 MCG/ACT nasal spray Place 1 spray into both nostrils daily. 48 g 3   gabapentin (NEURONTIN) 100 MG capsule Take 1 capsule (100 mg total) by mouth 3 (three) times daily. 90 capsule 0   guaifenesin (HUMIBID E) 400 MG TABS tablet Take 1 tablet 3 times daily as needed for chest congestion and cough 21 tablet 0   ibuprofen (ADVIL) 600 MG  tablet Take 1 tablet (600 mg total) by mouth every 8 (eight) hours as needed for up to 30 doses for fever, headache, mild pain or moderate pain (Inflammation). Take 1 tablet 3 times daily as needed for inflammation of upper airways and/or pain. 30 tablet 0   levocetirizine (XYZAL) 5 MG tablet TAKE 1 TABLET(5 MG) BY MOUTH EVERY EVENING 90 tablet 2   Magnesium 250 MG TABS Take 1 tablet by mouth once a week.     meclizine (ANTIVERT) 25 MG tablet Take 1 tablet (25 mg total) by mouth 3 (three) times daily as needed for dizziness. 15 tablet 0   meloxicam (MOBIC) 15 MG tablet Take 1 tablet (15 mg total) by mouth daily. 30 tablet 1   metFORMIN (GLUCOPHAGE) 500 MG tablet TAKE 1 TABLET(500 MG) BY MOUTH TWICE DAILY WITH A MEAL 60 tablet 3   metoprolol tartrate (LOPRESSOR) 25 MG tablet Take one tablet by mouth twice a day.  May take one additional tablet by mouth as needed for breakthrough palpitations. 270 tablet 3   montelukast (SINGULAIR) 10 MG tablet TAKE 1 TABLET BY MOUTH 1 TIME AT NIGHT FOR COUGHING OR WHEEZING 90 tablet 3   pantoprazole (PROTONIX) 40 MG tablet TAKE 1 TABLET(40 MG) BY MOUTH TWICE DAILY 60 tablet 3   PRESCRIPTION MEDICATION Inhale into the lungs at bedtime. CPAP     promethazine-dextromethorphan (PROMETHAZINE-DM) 6.25-15 MG/5ML syrup Take 5 mLs by mouth 4 (four) times daily as needed for cough. 180 mL 0   rivaroxaban (XARELTO) 20 MG TABS tablet Take 1 tablet (20 mg total) by mouth daily with supper. 90 tablet 3   sildenafil (VIAGRA) 50 MG tablet Take 1 tablet (50 mg total) by mouth daily as needed for erectile dysfunction. 10 tablet 0   Vitamin D, Ergocalciferol, (DRISDOL) 1.25 MG (50000 UNIT) CAPS capsule Take 1 capsule (50,000 Units total) by mouth every 7 (seven) days. 8 capsule 0   Vitamin D, Ergocalciferol, (DRISDOL) 1.25 MG (50000 UNIT) CAPS capsule Take 1 capsule (50,000 Units total) by mouth every 7 (seven) days. 12 capsule 0  No current facility-administered medications on file  prior to visit.    BP 126/69    Pulse (!) 56    Temp (!) 97.3 F (36.3 C)    Resp 18    Ht 5\' 9"  (1.753 m)    Wt 247 lb (112 kg)    SpO2 99%    BMI 36.48 kg/m       Objective:   Physical Exam  General Mental Status- Alert. General Appearance- Not in acute distress.   Skin General: Color- Normal Color. Moisture- Normal Moisture.  Neck Carotid Arteries- Normal color. Moisture- Normal Moisture. No carotid bruits. No JVD.  Chest and Lung Exam Auscultation: Breath Sounds:-Normal.  Cardiovascular Auscultation:Rythm- Regular. Murmurs & Other Heart Sounds:Auscultation of the heart reveals- No Murmurs.  Abdomen Inspection:-Inspeection Normal. Palpation/Percussion:Note:No mass. Palpation and Percussion of the abdomen reveal- Non Tender, Non Distended + BS, no rebound or guarding.   Neurologic Cranial Nerve exam:- CN III-XII intact(No nystagmus), symmetric smile. Strength:- 5/5 equal and symmetric strength both upper and lower extremities.   Heent- no sinus pressure. No tonsil hypertrophy. Canals clear and normal tm.    Assessment & Plan:   Patient Instructions  Your COVID test was negative.  Flu test also negative on Friday.  Presently presenting with mostly residual upper respiratory infection type symptoms.  Discontinue medications emergency department prescribed.  Recommend using Flonase for nasal congestion and prescribed Hycodan for cough.  I do not think you need antibiotic presently.  However we will provide azithromycin antibiotic to use if needed for sinus pressure/infection type symptoms or bronchitis symptoms.  Signs and symptoms worsen or change let Wednesday know.  Follow-up in 7 days or sooner if needed.   [place], PA-C

## 2021-08-26 NOTE — Patient Instructions (Signed)
Your COVID test was negative.  Flu test also negative on Friday.  Presently presenting with mostly residual upper respiratory infection type symptoms.  Discontinue medications emergency department prescribed.  Recommend using Flonase for nasal congestion and prescribed Hycodan for cough.  I do not think you need antibiotic presently.  However we will provide azithromycin antibiotic to use if needed for sinus pressure/infection type symptoms or bronchitis symptoms.  Signs and symptoms worsen or change let us know.  Follow-up in 7 days or sooner if needed.

## 2021-08-28 ENCOUNTER — Other Ambulatory Visit: Payer: Self-pay | Admitting: Medical

## 2021-09-08 ENCOUNTER — Ambulatory Visit (INDEPENDENT_AMBULATORY_CARE_PROVIDER_SITE_OTHER): Payer: Medicare Other | Admitting: Medical

## 2021-09-08 VITALS — BP 133/79 | HR 60 | Temp 98.4°F | Resp 18 | Ht 69.0 in | Wt 251.0 lb

## 2021-09-08 DIAGNOSIS — R0981 Nasal congestion: Secondary | ICD-10-CM

## 2021-09-08 DIAGNOSIS — R21 Rash and other nonspecific skin eruption: Secondary | ICD-10-CM

## 2021-09-08 DIAGNOSIS — L299 Pruritus, unspecified: Secondary | ICD-10-CM

## 2021-09-08 DIAGNOSIS — Z20822 Contact with and (suspected) exposure to covid-19: Secondary | ICD-10-CM

## 2021-09-08 LAB — POC COVID19 BINAXNOW: SARS Coronavirus 2 Ag: NEGATIVE

## 2021-09-08 MED ORDER — CLOTRIMAZOLE-BETAMETHASONE 1-0.05 % EX CREA: 1.0000 "application " | TOPICAL_CREAM | Freq: Every day | CUTANEOUS | 0 refills | Status: DC

## 2021-09-08 NOTE — Patient Instructions (Addendum)
Rash and itching of back. Appears to be area of dry skin. Advise vaseline mid day. Will also rx lotrisone to use in am and pm for one week. This coveres fungal and allergic causes. Stop lotrisone after one week.  For covid exposure and mild nasal congestion. Covid test negative. Repeat test otc if symptoms worsen or change. If + let us know as can give antiviral within 5 days of symptoms onset  Follow up in 3oth or sooner if needed.

## 2021-09-08 NOTE — Progress Notes (Signed)
Subjective:    Patient ID: Richard Walker, male    DOB: June 20, 1956, 66 y.o.   MRN: [id number]  HPI Pt has mid thoracic back area that has been itching since last wed. First noticed at night. Pt got opinion from pharmacy staff and they recommend some cream that cortisone based. The itching is till present.   No new soaps, creams or detergents.  Pt daughter has covid. Today pt mild nasal congested. No fever, no chills,now sweats, no body aches or cough.     Review of Systems  Constitutional:  Negative for chills, fatigue and fever.  Respiratory:  Negative for cough, chest tightness, shortness of breath and wheezing.   Cardiovascular:  Negative for chest pain and palpitations.  Gastrointestinal:  Negative for abdominal pain.  Genitourinary:  Negative for difficulty urinating.  Musculoskeletal:  Negative for back pain and myalgias.  Skin:  Positive for rash.   Past Medical History:  Diagnosis Date   DDD (degenerative disc disease), lumbar    L3/L4   Diverticulosis    GERD (gastroesophageal reflux disease)    Hyperlipidemia    Low sperm motility    Pre-diabetes    Sleep apnea    uses cpap    SVT (supraventricular tachycardia) (HCC)      Social History   Socioeconomic History   Marital status: Married    Spouse name: Not on file   Number of children: Not on file   Years of education: Not on file   Highest education level: Not on file  Occupational History   Not on file  Tobacco Use   Smoking status: Former    Packs/day: 1.00    Years: 10.00    Pack years: 10.00    Types: Cigarettes    Quit date: 28    Years since quitting: 31.0   Smokeless tobacco: Never   Tobacco comments:    Uses Hookah Pipe occasionally  Vaping Use   Vaping Use: Never used  Substance and Sexual Activity   Alcohol use: No   Drug use: No   Sexual activity: Yes  Other Topics Concern   Not on file  Social History Narrative   Marital Status: Married Engineer, drilling)   Children:  4 (3 Sons/1  Daughter)   Pets:  None    Living Situation: Lives with spouse and 4 children   Origin:  He was born in [place].   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     Alcohol Use:  None   Drug Use:  None   Diet:  Regular   Exercise:  None   Hobbies:  Cards   Social Determinants of Health   Financial Resource Strain: Not on file  Food Insecurity: Not on file  Transportation Needs: Not on file  Physical Activity: Not on file  Stress: Not on file  Social Connections: Not on file  Intimate Partner Violence: Not on file    Past Surgical History:  Procedure Laterality Date   AMPUTATION Left 07/15/2018   Procedure: . REPAIR OF LEFT INDEX AND LONG FINGER BY FUSION /  REPAIR OF RING FINGER;  Surgeon: Mack Hook, MD;  Location: [facility] OR;  Service: Orthopedics;  Laterality: Left;   BACK SURGERY     CARDIAC CATHETERIZATION N/A 12/22/2015   Procedure: Left Heart Cath and Coronary Angiography;  Surgeon: Peter M Swaziland, MD;  Location: [facility] INVASIVE CV LAB;  Service:  Cardiovascular;  Laterality: N/A;   CARDIAC ELECTROPHYSIOLOGY STUDY AND ABLATION     ELECTROPHYSIOLOGIC STUDY N/A 02/19/2016   Procedure: SVT Ablation;  Surgeon: Marinus MawGregg W Taylor, MD;  [facility];  Service: Cardiovascular;  Laterality: N/A;   septal reconstruction  2017   TURBINATE REDUCTION Bilateral 07/2016   WISDOM TOOTH EXTRACTION      Family History  Problem Relation Age of Onset   Hypertension Father    Food Allergy Son    Colon cancer Neg Hx    Stomach cancer Neg Hx    Allergic rhinitis Neg Hx    Angioedema Neg Hx    Asthma Neg Hx    Eczema Neg Hx    Immunodeficiency Neg Hx    Urticaria Neg Hx     Allergies  Allergen Reactions   Dust Mite Extract    Pork-Derived Products Swelling   Statins     Muscle aches   Tree Extract Other (See Comments)    Intolerance      Current Outpatient Medications on File Prior to Visit  Medication Sig Dispense Refill   acetaminophen (TYLENOL) 325 MG tablet Take 2 tablets (650 mg total) by mouth every 6 (six) hours.     albuterol (PROAIR HFA) 108 (90 Base) MCG/ACT inhaler Inhale 1-2 puffs into the lungs every 6 (six) hours as needed for wheezing or shortness of breath. 24 g 3   azelastine (ASTELIN) 0.1 % nasal spray 1-2 sprays per nostril 2 times daily as needed. 90 mL 3   budesonide-formoterol (SYMBICORT) 160-4.5 MCG/ACT inhaler Inhale 2 puffs into the lungs in the morning and at bedtime. 3 each 3   cyclobenzaprine (FLEXERIL) 10 MG tablet Take 1 tablet (10 mg total) by mouth 3 (three) times daily as needed. 45 tablet 1   diclofenac (VOLTAREN) 75 MG EC tablet Take 1 tablet (75 mg total) by mouth 2 (two) times daily. 90 tablet 2   ezetimibe (ZETIA) 10 MG tablet Take 1 tablet (10 mg total) by mouth daily. 30 tablet 11   famotidine-calcium carbonate-magnesium hydroxide (PEPCID COMPLETE) 10-800-165 MG chewable tablet Chew by mouth.     fenofibrate (TRICOR) 145 MG tablet Take 1 tablet (145 mg total) by mouth daily. 90 tablet 3   flecainide (TAMBOCOR) 150 MG tablet Take 1 tablet (150 mg total) by mouth 2 (two) times daily. 180 tablet 3   fluticasone (FLONASE) 50 MCG/ACT nasal spray Place 1 spray into both nostrils daily. 48 g 3   fluticasone (FLONASE) 50 MCG/ACT nasal spray SHAKE LIQUID AND USE 2 SPRAYS IN EACH NOSTRIL DAILY 48 g 2   gabapentin (NEURONTIN) 100 MG capsule Take 1 capsule (100 mg total) by mouth 3 (three) times daily. 90 capsule 0   guaifenesin (HUMIBID E) 400 MG TABS tablet Take 1 tablet 3 times daily as needed for chest congestion and cough 21 tablet 0   HYDROcodone bit-homatropine (HYCODAN) 5-1.5 MG/5ML syrup Take 5 mLs by mouth every 8 (eight) hours as needed for cough. 120 mL 0   ibuprofen (ADVIL) 600 MG tablet Take 1 tablet (600 mg total) by mouth every 8 (eight) hours as needed for up to 30 doses for fever,  headache, mild pain or moderate pain (Inflammation). Take 1 tablet 3 times daily as needed for inflammation of upper airways and/or pain. 30 tablet 0   levocetirizine (XYZAL) 5 MG tablet TAKE 1 TABLET(5 MG) BY MOUTH EVERY EVENING 90 tablet 2   Magnesium 250 MG TABS Take 1 tablet by mouth  once a week.     meclizine (ANTIVERT) 25 MG tablet Take 1 tablet (25 mg total) by mouth 3 (three) times daily as needed for dizziness. 15 tablet 0   meloxicam (MOBIC) 15 MG tablet Take 1 tablet (15 mg total) by mouth daily. 30 tablet 1   metFORMIN (GLUCOPHAGE) 500 MG tablet TAKE 1 TABLET(500 MG) BY MOUTH TWICE DAILY WITH A MEAL 60 tablet 3   metoprolol tartrate (LOPRESSOR) 25 MG tablet Take one tablet by mouth twice a day.  May take one additional tablet by mouth as needed for breakthrough palpitations. 270 tablet 3   montelukast (SINGULAIR) 10 MG tablet TAKE 1 TABLET BY MOUTH 1 TIME AT NIGHT FOR COUGHING OR WHEEZING 90 tablet 3   pantoprazole (PROTONIX) 40 MG tablet TAKE 1 TABLET(40 MG) BY MOUTH TWICE DAILY 60 tablet 3   PRESCRIPTION MEDICATION Inhale into the lungs at bedtime. CPAP     promethazine-dextromethorphan (PROMETHAZINE-DM) 6.25-15 MG/5ML syrup Take 5 mLs by mouth 4 (four) times daily as needed for cough. 180 mL 0   rivaroxaban (XARELTO) 20 MG TABS tablet Take 1 tablet (20 mg total) by mouth daily with supper. 90 tablet 3   sildenafil (VIAGRA) 50 MG tablet Take 1 tablet (50 mg total) by mouth daily as needed for erectile dysfunction. 10 tablet 0   Vitamin D, Ergocalciferol, (DRISDOL) 1.25 MG (50000 UNIT) CAPS capsule Take 1 capsule (50,000 Units total) by mouth every 7 (seven) days. 8 capsule 0   Vitamin D, Ergocalciferol, (DRISDOL) 1.25 MG (50000 UNIT) CAPS capsule Take 1 capsule (50,000 Units total) by mouth every 7 (seven) days. 12 capsule 0   No current facility-administered medications on file prior to visit.    BP 133/79    Pulse 60    Temp 98.4 F (36.9 C)    Resp 18    Ht 5\' 9"  (1.753 m)    Wt  251 lb (113.9 kg)    SpO2 98%    BMI 37.07 kg/m       Objective:   Physical Exam  General- No acute distress. Pleasant patient. Neck- Full range of motion, no jvd Lungs- Clear, even and unlabored. Heart- regular rate and rhythm. Neurologic- CNII- XII grossly intact.  Skin- mid thoracic area has area 12 cm x 12 cm faint pinkish, feels dry and slight skin peeling. No blisters. No induration.     Assessment & Plan:   Patient Instructions  Rash and itching of back. Appears to be area of dry skin. Advise vaseline mid day. Will also rx lotrisone to use in am and pm for one week. This coveres fungal and allergic causes. Stop lotrisone after one week.  For covid exposure and mild nasal congestion. Covid test negative. Repeat test otc if symptoms worsen or change. If + let know as can give antiviral within 5 days of symptoms onset  Follow up in 3oth or sooner if needed.   [place], PA-C

## 2021-09-17 ENCOUNTER — Encounter: Payer: Self-pay | Admitting: Medical

## 2021-09-21 ENCOUNTER — Ambulatory Visit (INDEPENDENT_AMBULATORY_CARE_PROVIDER_SITE_OTHER): Payer: Medicare Other | Admitting: Medical

## 2021-09-21 VITALS — BP 130/70 | HR 63 | Temp 98.5°F | Resp 18 | Ht 69.0 in | Wt 251.0 lb

## 2021-09-21 DIAGNOSIS — R8290 Unspecified abnormal findings in urine: Secondary | ICD-10-CM | POA: Diagnosis not present

## 2021-09-21 DIAGNOSIS — L299 Pruritus, unspecified: Secondary | ICD-10-CM

## 2021-09-21 DIAGNOSIS — B370 Candidal stomatitis: Secondary | ICD-10-CM

## 2021-09-21 LAB — POC URINALSYSI DIPSTICK (AUTOMATED)
Bilirubin, UA: NEGATIVE
Blood, UA: NEGATIVE
Glucose, UA: NEGATIVE
Ketones, UA: NEGATIVE
Leukocytes, UA: NEGATIVE
Nitrite, UA: NEGATIVE
Protein, UA: NEGATIVE
Spec Grav, UA: 1.015 (ref 1.010–1.025)
Urobilinogen, UA: 0.2 E.U./dL
pH, UA: 7.5 (ref 5.0–8.0)

## 2021-09-21 MED ORDER — FLUCONAZOLE 150 MG PO TABS: 150.0000 mg | ORAL_TABLET | Freq: Every day | ORAL | 0 refills | Status: DC

## 2021-09-21 MED ORDER — CLOTRIMAZOLE-BETAMETHASONE 1-0.05 % EX CREA: 1.0000 "application " | TOPICAL_CREAM | Freq: Every day | CUTANEOUS | 0 refills | Status: DC

## 2021-09-21 NOTE — Progress Notes (Signed)
Subjective:    Patient ID: Richard Walker, male    DOB: [birth year], 66 y.o.   MRN: [id number]  HPI Pt in for follow up. He states his skin is almost completely better.   I thought   "Rash and itching of back. Appears to be area of dry skin. Advise vaseline mid day. Will also rx lotrisone to use in am and pm for one week. This coveres fungal and allergic causes. Stop lotrisone after one week.   For covid exposure and mild nasal congestion. Covid test negative. Repeat test otc if symptoms worsen or change. If + let us know as can give antiviral within 5 days of symptoms onset."   Pt states his mouth is mild dry in the morning and throat mild irritated/scratchy for 2 mornings. But by late morning. He describes getting white film at times inside buccal mucosa.   Pt state one month ago had dark urine with bad smell. Was present for one day. Pt does remember he ate asparagillas. He does like asparagiss a lot.   Review of Systems  Constitutional:  Negative for chills, fatigue and fever.  Respiratory:  Negative for cough, chest tightness, shortness of breath and wheezing.   Cardiovascular:  Negative for chest pain and palpitations.  Gastrointestinal:  Negative for abdominal pain, constipation, diarrhea and nausea.  Genitourinary:  Negative for dysuria, frequency, penile pain and scrotal swelling.  Musculoskeletal:  Negative for back pain.  Neurological:  Negative for dizziness, syncope, numbness and headaches.  Hematological:  Negative for adenopathy. Does not bruise/bleed easily.  Psychiatric/Behavioral:  Negative for behavioral problems and confusion. The patient is not nervous/anxious.     Past Medical History:  Diagnosis Date   DDD (degenerative disc disease), lumbar    L3/L4   Diverticulosis    GERD (gastroesophageal reflux disease)    Hyperlipidemia    Low sperm motility    Pre-diabetes    Sleep apnea    uses cpap    SVT (supraventricular tachycardia) (HCC)      Social  History   Socioeconomic History   Marital status: Married    Spouse name: Not on file   Number of children: Not on file   Years of education: Not on file   Highest education level: Not on file  Occupational History   Not on file  Tobacco Use   Smoking status: Former    Packs/day: 1.00    Years: 10.00    Pack years: 10.00    Types: Cigarettes    Quit date: 79    Years since quitting: 31.1   Smokeless tobacco: Never   Tobacco comments:    Uses Hookah Pipe occasionally  Vaping Use   Vaping Use: Never used  Substance and Sexual Activity   Alcohol use: No   Drug use: No   Sexual activity: Yes  Other Topics Concern   Not on file  Social History Narrative   Marital Status: Married Clinical biochemist)   Children:  4 (3 Sons/1 Daughter)   Pets:  None    Living Situation: Lives with spouse and 4 children   Origin:  He was born in [place].   Occupation: Catering manager)   Education: [place])   Tobacco Use/Exposure:  Smokes "special" tobacco from his home country ([place]     Alcohol Use:  None   Drug Use:  None   Diet:  Regular   Exercise:  None   Hobbies:  Cards   Social Determinants of Health  Financial Resource Strain: Not on file  Food Insecurity: Not on file  Transportation Needs: Not on file  Physical Activity: Not on file  Stress: Not on file  Social Connections: Not on file  Intimate Partner Violence: Not on file    Past Surgical History:  Procedure Laterality Date   AMPUTATION Left 07/15/2018   Procedure: . REPAIR OF LEFT INDEX AND LONG FINGER BY FUSION /  REPAIR OF RING FINGER;  Surgeon: Milly Jakob, MD;  Location: [facility];  Service: Orthopedics;  Laterality: Left;   BACK SURGERY     CARDIAC CATHETERIZATION N/A 12/22/2015   Procedure: Left Heart Cath and Coronary Angiography;  Surgeon: Peter M Martinique, MD;  Location: [facility];  Service: Cardiovascular;  Laterality: N/A;   CARDIAC ELECTROPHYSIOLOGY STUDY AND  ABLATION     ELECTROPHYSIOLOGIC STUDY N/A 02/19/2016   Procedure: SVT Ablation;  Surgeon: Evans Lance, MD;  Location: [facility];  Service: Cardiovascular;  Laterality: N/A;   septal reconstruction  2017   TURBINATE REDUCTION Bilateral 07/2016   WISDOM TOOTH EXTRACTION      Family History  Problem Relation Age of Onset   Hypertension Father    Food Allergy Son    Colon cancer Neg Hx    Stomach cancer Neg Hx    Allergic rhinitis Neg Hx    Angioedema Neg Hx    Asthma Neg Hx    Eczema Neg Hx    Immunodeficiency Neg Hx    Urticaria Neg Hx     Allergies  Allergen Reactions   Dust Mite Extract    Pork-Derived Products Swelling   Statins     Muscle aches   Tree Extract Other (See Comments)    Intolerance     Current Outpatient Medications on File Prior to Visit  Medication Sig Dispense Refill   acetaminophen (TYLENOL) 325 MG tablet Take 2 tablets (650 mg total) by mouth every 6 (six) hours.     albuterol (PROAIR HFA) 108 (90 Base) MCG/ACT inhaler Inhale 1-2 puffs into the lungs every 6 (six) hours as needed for wheezing or shortness of breath. 24 g 3   azelastine (ASTELIN) 0.1 % nasal spray 1-2 sprays per nostril 2 times daily as needed. 90 mL 3   budesonide-formoterol (SYMBICORT) 160-4.5 MCG/ACT inhaler Inhale 2 puffs into the lungs in the morning and at bedtime. 3 each 3   clotrimazole-betamethasone (LOTRISONE) cream Apply 1 application topically daily. 30 g 0   cyclobenzaprine (FLEXERIL) 10 MG tablet Take 1 tablet (10 mg total) by mouth 3 (three) times daily as needed. 45 tablet 1   diclofenac (VOLTAREN) 75 MG EC tablet Take 1 tablet (75 mg total) by mouth 2 (two) times daily. 90 tablet 2   ezetimibe (ZETIA) 10 MG tablet Take 1 tablet (10 mg total) by mouth daily. 30 tablet 11   famotidine-calcium carbonate-magnesium hydroxide (PEPCID COMPLETE) 10-800-165 MG chewable tablet Chew by mouth.     fenofibrate (TRICOR) 145 MG tablet Take 1 tablet (145 mg total) by mouth  daily. 90 tablet 3   flecainide (TAMBOCOR) 150 MG tablet Take 1 tablet (150 mg total) by mouth 2 (two) times daily. 180 tablet 3   fluticasone (FLONASE) 50 MCG/ACT nasal spray Place 1 spray into both nostrils daily. 48 g 3   fluticasone (FLONASE) 50 MCG/ACT nasal spray SHAKE LIQUID AND USE 2 SPRAYS IN EACH NOSTRIL DAILY 48 g 2   gabapentin (NEURONTIN) 100 MG capsule Take 1 capsule (100 mg total) by  mouth 3 (three) times daily. 90 capsule 0   guaifenesin (HUMIBID E) 400 MG TABS tablet Take 1 tablet 3 times daily as needed for chest congestion and cough 21 tablet 0   HYDROcodone bit-homatropine (HYCODAN) 5-1.5 MG/5ML syrup Take 5 mLs by mouth every 8 (eight) hours as needed for cough. 120 mL 0   ibuprofen (ADVIL) 600 MG tablet Take 1 tablet (600 mg total) by mouth every 8 (eight) hours as needed for up to 30 doses for fever, headache, mild pain or moderate pain (Inflammation). Take 1 tablet 3 times daily as needed for inflammation of upper airways and/or pain. 30 tablet 0   levocetirizine (XYZAL) 5 MG tablet TAKE 1 TABLET(5 MG) BY MOUTH EVERY EVENING 90 tablet 2   Magnesium 250 MG TABS Take 1 tablet by mouth once a week.     meclizine (ANTIVERT) 25 MG tablet Take 1 tablet (25 mg total) by mouth 3 (three) times daily as needed for dizziness. 15 tablet 0   meloxicam (MOBIC) 15 MG tablet Take 1 tablet (15 mg total) by mouth daily. 30 tablet 1   metFORMIN (GLUCOPHAGE) 500 MG tablet TAKE 1 TABLET(500 MG) BY MOUTH TWICE DAILY WITH A MEAL 60 tablet 3   metoprolol tartrate (LOPRESSOR) 25 MG tablet Take one tablet by mouth twice a day.  May take one additional tablet by mouth as needed for breakthrough palpitations. 270 tablet 3   montelukast (SINGULAIR) 10 MG tablet TAKE 1 TABLET BY MOUTH 1 TIME AT NIGHT FOR COUGHING OR WHEEZING 90 tablet 3   pantoprazole (PROTONIX) 40 MG tablet TAKE 1 TABLET(40 MG) BY MOUTH TWICE DAILY 60 tablet 3   PRESCRIPTION MEDICATION Inhale into the lungs at bedtime. CPAP      promethazine-dextromethorphan (PROMETHAZINE-DM) 6.25-15 MG/5ML syrup Take 5 mLs by mouth 4 (four) times daily as needed for cough. 180 mL 0   rivaroxaban (XARELTO) 20 MG TABS tablet Take 1 tablet (20 mg total) by mouth daily with supper. 90 tablet 3   sildenafil (VIAGRA) 50 MG tablet Take 1 tablet (50 mg total) by mouth daily as needed for erectile dysfunction. 10 tablet 0   Vitamin D, Ergocalciferol, (DRISDOL) 1.25 MG (50000 UNIT) CAPS capsule Take 1 capsule (50,000 Units total) by mouth every 7 (seven) days. 8 capsule 0   Vitamin D, Ergocalciferol, (DRISDOL) 1.25 MG (50000 UNIT) CAPS capsule Take 1 capsule (50,000 Units total) by mouth every 7 (seven) days. 12 capsule 0   No current facility-administered medications on file prior to visit.    BP 130/70    Pulse 63    Temp 98.5 F (36.9 C)    Resp 18    Ht 5\' 9"  (1.753 m)    Wt 251 lb (113.9 kg)    SpO2 99%    BMI 37.07 kg/m        Objective:   Physical Exam   General Mental Status- Alert. General Appearance- Not in acute distress.   Skin Upper back skin looks normal presently.  Neck Carotid Arteries- Normal color. Moisture- Normal Moisture. No carotid bruits. No JVD.  Chest and Lung Exam Auscultation: Breath Sounds:-Normal.  Cardiovascular Auscultation:Rythm- Regular. Murmurs & Other Heart Sounds:Auscultation of the heart reveals- No Murmurs.  Abdomen Inspection:-Inspeection Normal. Palpation/Percussion:Note:No mass. Palpation and Percussion of the abdomen reveal- Non Tender, Non Distended + BS, no rebound or guarding.   Neurologic Cranial Nerve exam:- CN III-XII intact(No nystagmus), symmetric smile. Strength:- 5/5 equal and symmetric strength both upper and lower extremities.  Mouth-inside left buccal mucosa slight speckled faint whitish coloring.    Assessment & Plan:   Patient Instructions  Odor from the urine I do think it is likely from your eating asparagus.  For caution sake we can get urinalysis today.   Stay well-hydrated and I do think asparagus is overall healthy food.  Just be aware of potential smell.  You may have mild occasional thrush of mouth.  You do have mild elevated blood sugar levels in the past.  Presently slight speckled faint white rash left side buccal mucosa.  I will prescribe Diflucan use 1 tablet for 1 day.  See if this areas clears.  Do recommend also that you see dentist for teeth cleaning and to evaluate mild gingivitis.  Also would like for dentist to look at that area and becomes.  If you have any recurrent thicker white discharge would asked that you take a picture of the area and send me a message via MyChart as well.  Itching of skin has improved/resolved per your description.  Would recommend that you continue to moisturize her back.  Could make refill of Lotrisone available to use sparingly.  Follow up date to be determined after you get future vit d and a1c level.   Mackie Pai, PA-C

## 2021-09-21 NOTE — Patient Instructions (Addendum)
Odor from the urine I do think it is likely from your eating asparagus.  For caution sake we can get urinalysis today.  Stay well-hydrated and I do think asparagus is overall healthy food.  Just be aware of potential smell.  You may have mild occasional thrush of mouth.  You do have mild elevated blood sugar levels in the past.  Presently slight speckled faint white rash left side buccal mucosa.  I will prescribe Diflucan use 1 tablet for 1 day.  See if this areas clears.  Do recommend also that you see dentist for teeth cleaning and to evaluate mild gingivitis.  Also would like for dentist to look at that area and becomes.  If you have any recurrent thicker white discharge would asked that you take a picture of the area and send me a message via MyChart as well.  Itching of skin has improved/resolved per your description.  Would recommend that you continue to moisturize her back.  Could make refill of Lotrisone available to use sparingly.  Follow up date to be determined after you get future vit d and a1c level.

## 2021-10-04 ENCOUNTER — Other Ambulatory Visit: Payer: Self-pay | Admitting: Medical

## 2021-10-04 ENCOUNTER — Other Ambulatory Visit: Payer: Self-pay | Admitting: Internal Medicine

## 2021-10-05 ENCOUNTER — Other Ambulatory Visit: Payer: Self-pay | Admitting: *Deleted

## 2021-10-05 MED ORDER — METOPROLOL TARTRATE 25 MG PO TABS: ORAL_TABLET | ORAL | 3 refills | Status: DC

## 2021-10-05 NOTE — Telephone Encounter (Signed)
Prescription refill request for Xarelto received.  Indication: afib  Last office visit: taylor, 11/03/2020 Weight: 113.9 kg  Age: 66 yo  Scr: 1.31, 04/08/2021 CrCl: 90 ml/min   Refill sent.

## 2021-10-13 ENCOUNTER — Encounter: Payer: Self-pay | Admitting: Internal Medicine

## 2021-10-13 ENCOUNTER — Ambulatory Visit: Payer: Medicare Other | Admitting: Internal Medicine

## 2021-10-13 ENCOUNTER — Other Ambulatory Visit: Payer: Self-pay

## 2021-10-13 ENCOUNTER — Ambulatory Visit (INDEPENDENT_AMBULATORY_CARE_PROVIDER_SITE_OTHER): Payer: Medicare Other | Admitting: Internal Medicine

## 2021-10-13 VITALS — BP 110/64 | HR 54 | Ht 69.0 in | Wt 256.0 lb

## 2021-10-13 DIAGNOSIS — I10 Essential (primary) hypertension: Secondary | ICD-10-CM

## 2021-10-13 DIAGNOSIS — I480 Paroxysmal atrial fibrillation: Secondary | ICD-10-CM

## 2021-10-13 DIAGNOSIS — R0600 Dyspnea, unspecified: Secondary | ICD-10-CM | POA: Diagnosis not present

## 2021-10-13 NOTE — Patient Instructions (Addendum)
Medication Instructions:  Your physician recommends that you continue on your current medications as directed. Please refer to the Current Medication list given to you today.  Labwork: None ordered.  Testing/Procedures: Your physician has requested that you have an echocardiogram. Echocardiography is a painless test that uses sound waves to create images of your heart. It provides your doctor with information about the size and shape of your heart and how well your hearts chambers and valves are working. This procedure takes approximately one hour. There are no restrictions for this procedure.  Please schedule for ECHO  Follow-Up: Your physician wants you to follow-up in: one year with Lewayne Bunting, MD or one of the following Advanced Practice Providers on your designated Care Team:   Francis Dowse, [place] Casimiro Needle "Mardelle Matte" Lanna Poche, [place]   Any Other Special Instructions Will Be Listed Below (If Applicable).  If you need a refill on your cardiac medications before your next appointment, please call your pharmacy.

## 2021-10-13 NOTE — Progress Notes (Signed)
HPI [name] returns today for followup. He is a pleasant 66 yo man with SVT s/p ablation who developed palpitations and was found to have PAF. He has been maintained on fairly high dose flecainide. He wore a cardiac monitor a couple of months ago and was found to have occaisional PAC's and PVC's but no sustained arrhythmias. He c/o dyspnea with exertion. He has a h/o non-cardiac chest pain as well. He also has sleep apnea and has been wearing his CPAP. Finally he notes that his nose gets dry and crusty and he will have some blood when he blows his nose. He admits to dietary indiscretion and does not exercise.  Allergies  Allergen Reactions   Dust Mite Extract    Pork-Derived Products Swelling   Statins     Muscle aches   Tree Extract Other (See Comments)    Intolerance      Current Outpatient Medications  Medication Sig Dispense Refill   acetaminophen (TYLENOL) 325 MG tablet Take 2 tablets (650 mg total) by mouth every 6 (six) hours.     albuterol (PROAIR HFA) 108 (90 Base) MCG/ACT inhaler Inhale 1-2 puffs into the lungs every 6 (six) hours as needed for wheezing or shortness of breath. 24 g 3   azelastine (ASTELIN) 0.1 % nasal spray 1-2 sprays per nostril 2 times daily as needed. 90 mL 3   budesonide-formoterol (SYMBICORT) 160-4.5 MCG/ACT inhaler Inhale 2 puffs into the lungs in the morning and at bedtime. 3 each 3   clotrimazole-betamethasone (LOTRISONE) cream Apply 1 application topically daily. 30 g 0   clotrimazole-betamethasone (LOTRISONE) cream Apply 1 application topically daily. 30 g 0   cyclobenzaprine (FLEXERIL) 10 MG tablet Take 1 tablet (10 mg total) by mouth 3 (three) times daily as needed. 45 tablet 1   diclofenac (VOLTAREN) 75 MG EC tablet Take 1 tablet (75 mg total) by mouth 2 (two) times daily. 90 tablet 2   ezetimibe (ZETIA) 10 MG tablet Take 1 tablet (10 mg total) by mouth daily. 30 tablet 11   famotidine-calcium carbonate-magnesium hydroxide (PEPCID  COMPLETE) 10-800-165 MG chewable tablet Chew by mouth.     flecainide (TAMBOCOR) 150 MG tablet Take 1 tablet (150 mg total) by mouth 2 (two) times daily. 180 tablet 3   fluconazole (DIFLUCAN) 150 MG tablet Take 1 tablet (150 mg total) by mouth daily. 1 tablet 0   fluticasone (FLONASE) 50 MCG/ACT nasal spray Place 1 spray into both nostrils daily. 48 g 3   fluticasone (FLONASE) 50 MCG/ACT nasal spray SHAKE LIQUID AND USE 2 SPRAYS IN EACH NOSTRIL DAILY 48 g 2   gabapentin (NEURONTIN) 100 MG capsule Take 1 capsule (100 mg total) by mouth 3 (three) times daily. 90 capsule 0   guaifenesin (HUMIBID E) 400 MG TABS tablet Take 1 tablet 3 times daily as needed for chest congestion and cough 21 tablet 0   HYDROcodone bit-homatropine (HYCODAN) 5-1.5 MG/5ML syrup Take 5 mLs by mouth every 8 (eight) hours as needed for cough. 120 mL 0   ibuprofen (ADVIL) 600 MG tablet Take 1 tablet (600 mg total) by mouth every 8 (eight) hours as needed for up to 30 doses for fever, headache, mild pain or moderate pain (Inflammation). Take 1 tablet 3 times daily as needed for inflammation of upper airways and/or pain. 30 tablet 0   levocetirizine (XYZAL) 5 MG tablet TAKE 1 TABLET(5 MG) BY MOUTH EVERY EVENING 90 tablet 2   Magnesium 250 MG TABS Take 1  tablet by mouth once a week.     meclizine (ANTIVERT) 25 MG tablet Take 1 tablet (25 mg total) by mouth 3 (three) times daily as needed for dizziness. 15 tablet 0   meloxicam (MOBIC) 15 MG tablet Take 1 tablet (15 mg total) by mouth daily. 30 tablet 1   metFORMIN (GLUCOPHAGE) 500 MG tablet TAKE 1 TABLET(500 MG) BY MOUTH TWICE DAILY WITH A MEAL 60 tablet 3   metoprolol tartrate (LOPRESSOR) 25 MG tablet Take one tablet by mouth twice a day.  May take one additional tablet by mouth as needed for breakthrough palpitations. 270 tablet 3   montelukast (SINGULAIR) 10 MG tablet TAKE 1 TABLET BY MOUTH 1 TIME AT NIGHT FOR COUGHING OR WHEEZING 90 tablet 3   pantoprazole (PROTONIX) 40 MG tablet  TAKE 1 TABLET(40 MG) BY MOUTH TWICE DAILY 60 tablet 3   PRESCRIPTION MEDICATION Inhale into the lungs at bedtime. CPAP     promethazine-dextromethorphan (PROMETHAZINE-DM) 6.25-15 MG/5ML syrup Take 5 mLs by mouth 4 (four) times daily as needed for cough. 180 mL 0   rivaroxaban (XARELTO) 20 MG TABS tablet TAKE 1 TABLET(20 MG) BY MOUTH DAILY WITH SUPPER 90 tablet 1   sildenafil (VIAGRA) 50 MG tablet Take 1 tablet (50 mg total) by mouth daily as needed for erectile dysfunction. 10 tablet 0   Vitamin D, Ergocalciferol, (DRISDOL) 1.25 MG (50000 UNIT) CAPS capsule Take 1 capsule (50,000 Units total) by mouth every 7 (seven) days. 8 capsule 0   Vitamin D, Ergocalciferol, (DRISDOL) 1.25 MG (50000 UNIT) CAPS capsule Take 1 capsule (50,000 Units total) by mouth every 7 (seven) days. 12 capsule 0   fenofibrate (TRICOR) 145 MG tablet Take 1 tablet (145 mg total) by mouth daily. 90 tablet 3   No current facility-administered medications for this visit.     Past Medical History:  Diagnosis Date   DDD (degenerative disc disease), lumbar    L3/L4   Diverticulosis    GERD (gastroesophageal reflux disease)    Hyperlipidemia    Low sperm motility    Pre-diabetes    Sleep apnea    uses cpap    SVT (supraventricular tachycardia) (HCC)     ROS:   All systems reviewed and negative except as noted in the HPI.   Past Surgical History:  Procedure Laterality Date   AMPUTATION Left 07/15/2018   Procedure: . REPAIR OF LEFT INDEX AND LONG FINGER BY FUSION /  REPAIR OF RING FINGER;  Surgeon: Milly Jakob, MD;  Location: [facility];  Service: Orthopedics;  Laterality: Left;   BACK SURGERY     CARDIAC CATHETERIZATION N/A 12/22/2015   Procedure: Left Heart Cath and Coronary Angiography;  Surgeon: Peter M Martinique, MD;  Location: [facility];  Service: Cardiovascular;  Laterality: N/A;   CARDIAC ELECTROPHYSIOLOGY STUDY AND ABLATION     ELECTROPHYSIOLOGIC STUDY N/A 02/19/2016   Procedure: SVT Ablation;  Surgeon:  Evans Lance, MD;  Location: [facility];  Service: Cardiovascular;  Laterality: N/A;   septal reconstruction  2017   TURBINATE REDUCTION Bilateral 07/2016   WISDOM TOOTH EXTRACTION       Family History  Problem Relation Age of Onset   Hypertension Father    Food Allergy Son    Colon cancer Neg Hx    Stomach cancer Neg Hx    Allergic rhinitis Neg Hx    Angioedema Neg Hx    Asthma Neg Hx    Eczema Neg Hx    Immunodeficiency  Neg Hx    Urticaria Neg Hx      Social History   Socioeconomic History   Marital status: Married    Spouse name: Not on file   Number of children: Not on file   Years of education: Not on file   Highest education level: Not on file  Occupational History   Not on file  Tobacco Use   Smoking status: Former    Packs/day: 1.00    Years: 10.00    Pack years: 10.00    Types: Cigarettes    Quit date: 85    Years since quitting: 31.1   Smokeless tobacco: Never   Tobacco comments:    Uses Hookah Pipe occasionally  Vaping Use   Vaping Use: Never used  Substance and Sexual Activity   Alcohol use: No   Drug use: No   Sexual activity: Yes  Other Topics Concern   Not on file  Social History Narrative   Marital Status: Married Clinical biochemist)   Children:  4 (3 Sons/1 Daughter)   Pets:  None    Living Situation: Lives with spouse and 4 children   Origin:  He was born in [place].   Occupation: Catering manager)   Education: [employer])   Tobacco Use/Exposure:  Smokes "special" tobacco from his home country ([place]     Alcohol Use:  None   Drug Use:  None   Diet:  Regular   Exercise:  None   Hobbies:  Cards   Social Determinants of Health   Financial Resource Strain: Not on file  Food Insecurity: Not on file  Transportation Needs: Not on file  Physical Activity: Not on file  Stress: Not on file  Social Connections: Not on file  Intimate Partner Violence: Not on file     BP 110/64    Pulse (!) 54     Ht 5\' 9"  (1.753 m)    Wt 256 lb (116.1 kg)    SpO2 97%    BMI 37.80 kg/m   Physical Exam:  Well appearing NAD HEENT: Unremarkable Neck:  No JVD, no thyromegally Lymphatics:  No adenopathy Back:  No CVA tenderness Lungs:  Clear with no wheezes HEART:  Regular rate rhythm, soft systolic murmurs, no rubs, no clicks Abd:  soft, positive bowel sounds, no organomegally, no rebound, no guarding Ext:  2 plus pulses, no edema, no cyanosis, no clubbing Skin:  No rashes no nodules Neuro:  CN II through XII intact, motor grossly intact  EKG - sinus bradycardia  Assess/Plan:  Dyspnea - this is his biggest complaint. I will ask him to undergo 2D echo to evaluate his LV and RV function. PAF - he is maintaining NSR. His monitor was reassuring. SVT - he is s/p ablation. No recurrent SVT. Obesity - I encouraged him to work on weight loss.  Carleene Overlie Kamalani Mastro,MD

## 2021-10-29 ENCOUNTER — Ambulatory Visit (HOSPITAL_COMMUNITY): Payer: Medicare Other | Attending: Internal Medicine

## 2021-10-29 ENCOUNTER — Other Ambulatory Visit: Payer: Self-pay

## 2021-10-29 DIAGNOSIS — R0600 Dyspnea, unspecified: Secondary | ICD-10-CM | POA: Diagnosis present

## 2021-10-29 DIAGNOSIS — R0609 Other forms of dyspnea: Secondary | ICD-10-CM

## 2021-10-29 LAB — ECHOCARDIOGRAM COMPLETE
Area-P 1/2: 3.65 cm2
S' Lateral: 2.55 cm

## 2021-10-31 ENCOUNTER — Encounter: Payer: Self-pay | Admitting: Internal Medicine

## 2021-11-02 NOTE — Telephone Encounter (Signed)
Left a message for the pt but also sent him a My Chart message.  ?

## 2021-11-16 ENCOUNTER — Telehealth: Payer: Self-pay | Admitting: Medical

## 2021-11-16 DIAGNOSIS — I10 Essential (primary) hypertension: Secondary | ICD-10-CM

## 2021-11-16 DIAGNOSIS — E559 Vitamin D deficiency, unspecified: Secondary | ICD-10-CM

## 2021-11-16 DIAGNOSIS — R739 Hyperglycemia, unspecified: Secondary | ICD-10-CM

## 2021-11-16 DIAGNOSIS — E785 Hyperlipidemia, unspecified: Secondary | ICD-10-CM

## 2021-11-16 MED ORDER — VITAMIN D (ERGOCALCIFEROL) 1.25 MG (50000 UNIT) PO CAPS: ORAL_CAPSULE | ORAL | 0 refills | Status: DC

## 2021-11-16 NOTE — Telephone Encounter (Signed)
Chart opened to rx med, order lab, review chart, respond to my chart message or send message to staff member  

## 2021-12-21 ENCOUNTER — Other Ambulatory Visit: Payer: Self-pay | Admitting: Medical

## 2021-12-23 ENCOUNTER — Ambulatory Visit (INDEPENDENT_AMBULATORY_CARE_PROVIDER_SITE_OTHER): Payer: Medicare HMO

## 2021-12-23 ENCOUNTER — Telehealth: Payer: Self-pay

## 2021-12-23 VITALS — Ht 69.0 in | Wt 260.0 lb

## 2021-12-23 DIAGNOSIS — Z0000 Encounter for general adult medical examination without abnormal findings: Secondary | ICD-10-CM | POA: Diagnosis not present

## 2021-12-23 NOTE — Patient Instructions (Signed)
Richard Walker , ?Thank you for taking time to come for your Medicare Wellness Visit. I appreciate your ongoing commitment to your health goals. Please review the following plan we discussed and let me know if I can assist you in the future.  ? ?Screening recommendations/referrals: ?Colonoscopy: completed 12/04/2012, due 12/05/2022 ?Recommended yearly ophthalmology/optometry visit for glaucoma screening and checkup ?Recommended yearly dental visit for hygiene and checkup ? ?Vaccinations: ?Influenza vaccine: due 03/23/2022 ?Pneumococcal vaccine: due ?Tdap vaccine: completed 01/21/2021, due 01/22/2031 ?Shingles vaccine: completed   ?Covid-19:  11/04/2020, 10/02/2020 ? ?Advanced directives: Advance directive discussed with you today.  ? ?Conditions/risks identified: none ? ?Next appointment: Follow up in one year for your annual wellness visit.  ? ?Preventive Care 66 Years and Older, Male ?Preventive care refers to lifestyle choices and visits with your health care provider that can promote health and wellness. ?What does preventive care include? ?A yearly physical exam. This is also called an annual well check. ?Dental exams once or twice a year. ?Routine eye exams. Ask your health care provider how often you should have your eyes checked. ?Personal lifestyle choices, including: ?Daily care of your teeth and gums. ?Regular physical activity. ?Eating a healthy diet. ?Avoiding tobacco and drug use. ?Limiting alcohol use. ?Practicing safe sex. ?Taking low doses of aspirin every day. ?Taking vitamin and mineral supplements as recommended by your health care provider. ?What happens during an annual well check? ?The services and screenings done by your health care provider during your annual well check will depend on your age, overall health, lifestyle risk factors, and family history of disease. ?Counseling  ?Your health care provider may ask you questions about your: ?Alcohol use. ?Tobacco use. ?Drug use. ?Emotional well-being. ?Home  and relationship well-being. ?Sexual activity. ?Eating habits. ?History of falls. ?Memory and ability to understand (cognition). ?Work and work Statistician. ?Screening  ?You may have the following tests or measurements: ?Height, weight, and BMI. ?Blood pressure. ?Lipid and cholesterol levels. These may be checked every 5 years, or more frequently if you are over 84 years old. ?Skin check. ?Lung cancer screening. You may have this screening every year starting at age 39 if you have a 30-pack-year history of smoking and currently smoke or have quit within the past 15 years. ?Fecal occult blood test (FOBT) of the stool. You may have this test every year starting at age 57. ?Flexible sigmoidoscopy or colonoscopy. You may have a sigmoidoscopy every 5 years or a colonoscopy every 10 years starting at age 90. ?Prostate cancer screening. Recommendations will vary depending on your family history and other risks. ?Hepatitis C blood test. ?Hepatitis B blood test. ?Sexually transmitted disease (STD) testing. ?Diabetes screening. This is done by checking your blood sugar (glucose) after you have not eaten for a while (fasting). You may have this done every 1-3 years. ?Abdominal aortic aneurysm (AAA) screening. You may need this if you are a current or former smoker. ?Osteoporosis. You may be screened starting at age 74 if you are at high risk. ?Talk with your health care provider about your test results, treatment options, and if necessary, the need for more tests. ?Vaccines  ?Your health care provider may recommend certain vaccines, such as: ?Influenza vaccine. This is recommended every year. ?Tetanus, diphtheria, and acellular pertussis (Tdap, Td) vaccine. You may need a Td booster every 10 years. ?Zoster vaccine. You may need this after age 10. ?Pneumococcal 13-valent conjugate (PCV13) vaccine. One dose is recommended after age 50. ?Pneumococcal polysaccharide (PPSV23) vaccine. One dose is recommended  after age 110. ?Talk to  your health care provider about which screenings and vaccines you need and how often you need them. ?This information is not intended to replace advice given to you by your health care provider. Make sure you discuss any questions you have with your health care provider. ?Document Released: 09/05/2015 Document Revised: 04/28/2016 Document Reviewed: 06/10/2015 ?Elsevier Interactive Patient Education ? 2017 [place]. ? ?Fall Prevention in the Home ?Falls can cause injuries. They can happen to people of all ages. There are many things you can do to make your home safe and to help prevent falls. ?What can I do on the outside of my home? ?Regularly fix the edges of walkways and driveways and fix any cracks. ?Remove anything that might make you trip as you walk through a door, such as a raised step or threshold. ?Trim any bushes or trees on the path to your home. ?Use bright outdoor lighting. ?Clear any walking paths of anything that might make someone trip, such as rocks or tools. ?Regularly check to see if handrails are loose or broken. Make sure that both sides of any steps have handrails. ?Any raised decks and porches should have guardrails on the edges. ?Have any leaves, snow, or ice cleared regularly. ?Use sand or salt on walking paths during winter. ?Clean up any spills in your garage right away. This includes oil or grease spills. ?What can I do in the bathroom? ?Use night lights. ?Install grab bars by the toilet and in the tub and shower. Do not use towel bars as grab bars. ?Use non-skid mats or decals in the tub or shower. ?If you need to sit down in the shower, use a plastic, non-slip stool. ?Keep the floor dry. Clean up any water that spills on the floor as soon as it happens. ?Remove soap buildup in the tub or shower regularly. ?Attach bath mats securely with double-sided non-slip rug tape. ?Do not have throw rugs and other things on the floor that can make you trip. ?What can I do in the bedroom? ?Use  night lights. ?Make sure that you have a light by your bed that is easy to reach. ?Do not use any sheets or blankets that are too big for your bed. They should not hang down onto the floor. ?Have a firm chair that has side arms. You can use this for support while you get dressed. ?Do not have throw rugs and other things on the floor that can make you trip. ?What can I do in the kitchen? ?Clean up any spills right away. ?Avoid walking on wet floors. ?Keep items that you use a lot in easy-to-reach places. ?If you need to reach something above you, use a strong step stool that has a grab bar. ?Keep electrical cords out of the way. ?Do not use floor polish or wax that makes floors slippery. If you must use wax, use non-skid floor wax. ?Do not have throw rugs and other things on the floor that can make you trip. ?What can I do with my stairs? ?Do not leave any items on the stairs. ?Make sure that there are handrails on both sides of the stairs and use them. Fix handrails that are broken or loose. Make sure that handrails are as long as the stairways. ?Check any carpeting to make sure that it is firmly attached to the stairs. Fix any carpet that is loose or worn. ?Avoid having throw rugs at the top or bottom of the stairs. If you  do have throw rugs, attach them to the floor with carpet tape. ?Make sure that you have a light switch at the top of the stairs and the bottom of the stairs. If you do not have them, ask someone to add them for you. ?What else can I do to help prevent falls? ?Wear shoes that: ?Do not have high heels. ?Have rubber bottoms. ?Are comfortable and fit you well. ?Are closed at the toe. Do not wear sandals. ?If you use a stepladder: ?Make sure that it is fully opened. Do not climb a closed stepladder. ?Make sure that both sides of the stepladder are locked into place. ?Ask someone to hold it for you, if possible. ?Clearly mark and make sure that you can see: ?Any grab bars or handrails. ?First and last  steps. ?Where the edge of each step is. ?Use tools that help you move around (mobility aids) if they are needed. These include: ?Canes. ?Walkers. ?Scooters. ?Crutches. ?Turn on the lights when you go int

## 2021-12-23 NOTE — Addendum Note (Signed)
Addended by: Elisha Ponder E on: 12/23/2021 12:40 PM ? ? Modules accepted: Orders ? ?

## 2021-12-23 NOTE — Progress Notes (Addendum)
?I connected with [name] today by telephone and verified that I am speaking with the correct person using two identifiers. ?Location patient: home ?Location provider: work ?Persons participating in the virtual visit: Richard Walker, Vinicio Lynk LPN. ?  ?I discussed the limitations, risks, security and privacy concerns of performing an evaluation and management service by telephone and the availability of in person appointments. I also discussed with the patient that there may be a patient responsible charge related to this service. The patient expressed understanding and verbally consented to this telephonic visit.  ?  ?Interactive audio and video telecommunications were attempted between this provider and patient, however failed, due to patient having technical difficulties OR patient did not have access to video capability.  We continued and completed visit with audio only. ? ?  ? ?Vital signs may be patient reported or missing. ? ?Subjective:  ? Richard Walker is a 66 y.o. male who presents for an Initial Medicare Annual Wellness Visit. ? ?Review of Systems    ? ?Cardiac Risk Factors include: advanced age (>2155men, 50>65 women);hypertension;male gender;obesity (BMI >30kg/m2) ? ?   ?Objective:  ?  ?Today's Vitals  ? 12/23/21 1202 12/23/21 1203  ?Weight: 260 lb (117.9 kg)   ?Height: 5\' 9"  (1.753 m)   ?PainSc:  8   ? ?Body mass index is 38.4 kg/m?. ? ? ?  12/23/2021  ? 12:20 PM 01/21/2021  ?  1:32 PM 12/04/2020  ?  9:35 PM 11/05/2020  ?  4:20 PM 10/07/2020  ? 11:31 AM 07/26/2018  ?  9:35 AM 07/15/2018  ?  1:54 PM  ?Advanced Directives  ?Does Patient Have a Medical Advance Directive? No No No No No No No  ?Would patient like information on creating a medical advance directive?   No - Patient declined No - Patient declined No - Patient declined    ? ? ?Current Medications (verified) ?Outpatient Encounter Medications as of 12/23/2021  ?Medication Sig  ? albuterol (PROAIR HFA) 108 (90 Base) MCG/ACT inhaler Inhale 1-2 puffs into the  lungs every 6 (six) hours as needed for wheezing or shortness of breath.  ? azelastine (ASTELIN) 0.1 % nasal spray 1-2 sprays per nostril 2 times daily as needed.  ? budesonide-formoterol (SYMBICORT) 160-4.5 MCG/ACT inhaler Inhale 2 puffs into the lungs in the morning and at bedtime.  ? diclofenac (VOLTAREN) 75 MG EC tablet Take 1 tablet (75 mg total) by mouth 2 (two) times daily.  ? ezetimibe (ZETIA) 10 MG tablet Take 1 tablet (10 mg total) by mouth daily.  ? famotidine-calcium carbonate-magnesium hydroxide (PEPCID COMPLETE) 10-800-165 MG chewable tablet Chew by mouth.  ? ibuprofen (ADVIL) 600 MG tablet Take 1 tablet (600 mg total) by mouth every 8 (eight) hours as needed for up to 30 doses for fever, headache, mild pain or moderate pain (Inflammation). Take 1 tablet 3 times daily as needed for inflammation of upper airways and/or pain.  ? Magnesium 250 MG TABS Take 1 tablet by mouth once a week.  ? meclizine (ANTIVERT) 25 MG tablet Take 1 tablet (25 mg total) by mouth 3 (three) times daily as needed for dizziness.  ? meloxicam (MOBIC) 15 MG tablet Take 1 tablet (15 mg total) by mouth daily.  ? metFORMIN (GLUCOPHAGE) 500 MG tablet TAKE 1 TABLET(500 MG) BY MOUTH TWICE DAILY WITH A MEAL  ? metoprolol tartrate (LOPRESSOR) 25 MG tablet Take one tablet by mouth twice a day.  May take one additional tablet by mouth as needed for breakthrough palpitations.  ?  montelukast (SINGULAIR) 10 MG tablet TAKE 1 TABLET BY MOUTH 1 TIME AT NIGHT FOR COUGHING OR WHEEZING  ? pantoprazole (PROTONIX) 40 MG tablet TAKE 1 TABLET(40 MG) BY MOUTH TWICE DAILY  ? PRESCRIPTION MEDICATION Inhale into the lungs at bedtime. CPAP  ? promethazine-dextromethorphan (PROMETHAZINE-DM) 6.25-15 MG/5ML syrup Take 5 mLs by mouth 4 (four) times daily as needed for cough.  ? rivaroxaban (XARELTO) 20 MG TABS tablet TAKE 1 TABLET(20 MG) BY MOUTH DAILY WITH SUPPER  ? sildenafil (VIAGRA) 50 MG tablet Take 1 tablet (50 mg total) by mouth daily as needed for  erectile dysfunction.  ? Vitamin D, Ergocalciferol, (DRISDOL) 1.25 MG (50000 UNIT) CAPS capsule Take 1 capsule (50,000 Units total) by mouth every 7 (seven) days.  ? acetaminophen (TYLENOL) 325 MG tablet Take 2 tablets (650 mg total) by mouth every 6 (six) hours. (Patient not taking: Reported on 12/23/2021)  ? clotrimazole-betamethasone (LOTRISONE) cream Apply 1 application topically daily. (Patient not taking: Reported on 12/23/2021)  ? clotrimazole-betamethasone (LOTRISONE) cream Apply 1 application topically daily. (Patient not taking: Reported on 12/23/2021)  ? cyclobenzaprine (FLEXERIL) 10 MG tablet Take 1 tablet (10 mg total) by mouth 3 (three) times daily as needed. (Patient not taking: Reported on 12/23/2021)  ? fenofibrate (TRICOR) 145 MG tablet Take 1 tablet (145 mg total) by mouth daily.  ? flecainide (TAMBOCOR) 150 MG tablet TAKE 1 TABLET(150 MG) BY MOUTH TWICE DAILY  ? fluconazole (DIFLUCAN) 150 MG tablet Take 1 tablet (150 mg total) by mouth daily.  ? fluticasone (FLONASE) 50 MCG/ACT nasal spray Place 1 spray into both nostrils daily.  ? fluticasone (FLONASE) 50 MCG/ACT nasal spray SHAKE LIQUID AND USE 2 SPRAYS IN EACH NOSTRIL DAILY  ? gabapentin (NEURONTIN) 100 MG capsule Take 1 capsule (100 mg total) by mouth 3 (three) times daily.  ? guaifenesin (HUMIBID E) 400 MG TABS tablet Take 1 tablet 3 times daily as needed for chest congestion and cough  ? HYDROcodone bit-homatropine (HYCODAN) 5-1.5 MG/5ML syrup Take 5 mLs by mouth every 8 (eight) hours as needed for cough.  ? levocetirizine (XYZAL) 5 MG tablet TAKE 1 TABLET(5 MG) BY MOUTH EVERY EVENING (Patient not taking: Reported on 12/23/2021)  ? Vitamin D, Ergocalciferol, (DRISDOL) 1.25 MG (50000 UNIT) CAPS capsule Take 1 capsule (50,000 Units total) by mouth every 7 (seven) days.  ? Vitamin D, Ergocalciferol, (DRISDOL) 1.25 MG (50000 UNIT) CAPS capsule 1 tab po weekly.  ? ?No facility-administered encounter medications on file as of 12/23/2021.  ? ? ?Allergies  (verified) ?Dust mite extract, Pork-derived products, Statins, and Tree extract  ? ?History: ?Past Medical History:  ?Diagnosis Date  ? DDD (degenerative disc disease), lumbar   ? L3/L4  ? Diverticulosis   ? GERD (gastroesophageal reflux disease)   ? Hyperlipidemia   ? Low sperm motility   ? Pre-diabetes   ? Sleep apnea   ? uses cpap   ? SVT (supraventricular tachycardia) (HCC)   ? ?Past Surgical History:  ?Procedure Laterality Date  ? 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 Procedure: . REPAIR OF LEFT INDEX AND LONG FINGER BY FUSION /  REPAIR OF RING FINGER;  Surgeon: Mack Hook, MD;  Location: [facility] OR;  Service: Orthopedics;  Laterality: Left;  ? BACK SURGERY    ? CARDIAC CATHETERIZATION N/A 12/22/2015  ? Procedure: Left Heart Cath and Coronary Angiography;  Surgeon: Peter M Swaziland, MD;  Location: [facility] INVASIVE CV LAB;  Service: Cardiovascular;  Laterality: N/A;  ? CARDIAC ELECTROPHYSIOLOGY STUDY AND ABLATION    ?  ELECTROPHYSIOLOGIC STUDY N/A 02/19/2016  ? Procedure: SVT Ablation;  Surgeon: Marinus Maw, MD;  Location: [facility] INVASIVE CV LAB;  Service: Cardiovascular;  Laterality: N/A;  ? septal reconstruction  2017  ? TURBINATE REDUCTION Bilateral 07/2016  ? WISDOM TOOTH EXTRACTION    ? ?Family History  ?Problem Relation Age of Onset  ? Hypertension Father   ? Food Allergy Son   ? Colon cancer Neg Hx   ? Stomach cancer Neg Hx   ? Allergic rhinitis Neg Hx   ? Angioedema Neg Hx   ? Asthma Neg Hx   ? Eczema Neg Hx   ? Immunodeficiency Neg Hx   ? Urticaria Neg Hx   ? ?Social History  ? ?Socioeconomic History  ? Marital status: Married  ?  Spouse name: Not on file  ? Number of children: Not on file  ? Years of education: Not on file  ? Highest education level: Not on file  ?Occupational History  ? Not on file  ?Tobacco Use  ? Smoking status: Former  ?  Packs/day: 1.00  ?  Years: 10.00  ?  Pack years: 10.00  ?  Types: Cigarettes  ?  Quit date: 21  ?  Years since quitting: 31.3  ? Smokeless tobacco: Never  ? Tobacco comments:  ?   Uses [employer] occasionally  ?Vaping Use  ? Vaping Use: Never used  ?Substance and Sexual Activity  ? Alcohol use: No  ? Drug use: No  ? Sexual activity: Yes  ?Other Topics Concern  ? Not on file  ?Social His

## 2021-12-23 NOTE — Telephone Encounter (Signed)
? ?  Telephone encounter was:  Unsuccessful.  12/23/2021 ?Name: KEIN VANRIPER MRN: [id number] DOB: 02-21-1956 ? ?Unsuccessful outbound call made today to assist with:  Financial Difficulties related to financial strain ? ?Outreach Attempt:  1st Attempt ? ?A HIPAA compliant voice message was left requesting a return call.  Instructed patient to call back at left a message to call back at earliest convenience. ?. ? ? ? ?Lenard Forth ?Care Guide, Embedded Care Coordination ?[place], Care Management  ?[phone] ?[address] [place], [place], [place] [address] ?Phone: [phone] ?Email: [email or web address] ? ?  ?

## 2021-12-24 ENCOUNTER — Other Ambulatory Visit (INDEPENDENT_AMBULATORY_CARE_PROVIDER_SITE_OTHER): Payer: Medicare HMO

## 2021-12-24 DIAGNOSIS — E559 Vitamin D deficiency, unspecified: Secondary | ICD-10-CM | POA: Diagnosis not present

## 2021-12-24 DIAGNOSIS — R739 Hyperglycemia, unspecified: Secondary | ICD-10-CM | POA: Diagnosis not present

## 2021-12-24 DIAGNOSIS — E785 Hyperlipidemia, unspecified: Secondary | ICD-10-CM | POA: Diagnosis not present

## 2021-12-24 LAB — COMPREHENSIVE METABOLIC PANEL
ALT: 15 U/L (ref 0–53)
AST: 16 U/L (ref 0–37)
Albumin: 4.3 g/dL (ref 3.5–5.2)
Alkaline Phosphatase: 62 U/L (ref 39–117)
BUN: 20 mg/dL (ref 6–23)
CO2: 29 mEq/L (ref 19–32)
Calcium: 9.4 mg/dL (ref 8.4–10.5)
Chloride: 105 mEq/L (ref 96–112)
Creatinine, Ser: 1.12 mg/dL (ref 0.40–1.50)
GFR: 68.93 mL/min (ref >60.00)
Glucose, Bld: 101 mg/dL — ABNORMAL HIGH (ref 70–99)
Potassium: 4.5 mEq/L (ref 3.5–5.1)
Sodium: 140 mEq/L (ref 135–145)
Total Bilirubin: 0.5 mg/dL (ref 0.2–1.2)
Total Protein: 6.9 g/dL (ref 6.0–8.3)

## 2021-12-24 LAB — LIPID PANEL
Cholesterol: 226 mg/dL — ABNORMAL HIGH (ref 0–200)
HDL: 49.6 mg/dL (ref >39.00)
LDL Cholesterol: 154 mg/dL — ABNORMAL HIGH (ref 0–99)
NonHDL: 176.28
Total CHOL/HDL Ratio: 5
Triglycerides: 113 mg/dL (ref 0.0–149.0)
VLDL: 22.6 mg/dL (ref 0.0–40.0)

## 2021-12-24 LAB — HEMOGLOBIN A1C: Hgb A1c MFr Bld: 5.6 % (ref 4.6–6.5)

## 2021-12-25 ENCOUNTER — Telehealth: Payer: Self-pay

## 2021-12-25 NOTE — Telephone Encounter (Signed)
? ?  Telephone encounter was:  Successful.  ?12/25/2021 ?Name: Richard Walker MRN: [id number] DOB: 1956/05/26 ? ?Richard Walker is a 66 y.o. year old male who is a primary care patient of Saguier, Kateri Mc . The community resource team was consulted for assistance with financial stain ? ?Care guide performed the following interventions: Patient provided with information about care guide support team and interviewed to confirm resource needs.Patient stated he was having financial strain and need help with food and paying bills. He requested I email resources to him. He did get email while I was on the phone with him ? ?Follow Up Plan:  No further follow up planned at this time. The patient has been provided with needed resources. ? ? ?Lenard Forth ?Care Guide, Embedded Care Coordination ?[place], Care Management  ?[phone] ?[address] [place], [place], [place] [address] ?Phone: [phone] ?Email: [email or web address] ? ?  ?

## 2021-12-30 LAB — VITAMIN D 1,25 DIHYDROXY
Vitamin D 1, 25 (OH)2 Total: 73 pg/mL — ABNORMAL HIGH (ref 18–72)
Vitamin D2 1, 25 (OH)2: 34 pg/mL
Vitamin D3 1, 25 (OH)2: 39 pg/mL

## 2022-01-02 ENCOUNTER — Other Ambulatory Visit: Payer: Self-pay | Admitting: Medical

## 2022-01-06 ENCOUNTER — Ambulatory Visit (INDEPENDENT_AMBULATORY_CARE_PROVIDER_SITE_OTHER): Payer: Medicare HMO | Admitting: Medical

## 2022-01-06 ENCOUNTER — Telehealth: Payer: Self-pay | Admitting: Medical

## 2022-01-06 ENCOUNTER — Encounter: Payer: Self-pay | Admitting: Medical

## 2022-01-06 VITALS — BP 130/70 | HR 64 | Resp 18 | Ht 69.0 in | Wt 253.0 lb

## 2022-01-06 DIAGNOSIS — E785 Hyperlipidemia, unspecified: Secondary | ICD-10-CM | POA: Diagnosis not present

## 2022-01-06 DIAGNOSIS — R0789 Other chest pain: Secondary | ICD-10-CM

## 2022-01-06 DIAGNOSIS — Z6835 Body mass index (BMI) 35.0-35.9, adult: Secondary | ICD-10-CM

## 2022-01-06 DIAGNOSIS — E559 Vitamin D deficiency, unspecified: Secondary | ICD-10-CM

## 2022-01-06 DIAGNOSIS — R739 Hyperglycemia, unspecified: Secondary | ICD-10-CM

## 2022-01-06 DIAGNOSIS — I10 Essential (primary) hypertension: Secondary | ICD-10-CM

## 2022-01-06 NOTE — Patient Instructions (Addendum)
Vit d mild high recently. On 50,000 units weekly and 1000 iu otc. Continue current 50,000 iu weekly and cut back on otc 1000 iu to every other day. ? ?Paroxysmal atrial fibrillation-rate controlled today.  Continue flecainide and Xarelto. ? ?Hypertension-blood pressure reasonably controlled.  Continue metoprolol 25 mg twice daily. ? ?High cholesterol-continue fenofibrate and Zetia. ? ?Cardiac risk factors and 2 episodes of atypical transient chest pain.  I will go ahead and refer you back to your cardiologist and point out CT chest findings that showed atherosclerosis of the vessels.  If you have any recurrent chest pain events have to advise that you be seen in the emergency department at that time pending appointment with cardiologist. ? ?EKG today shows normal sinus rhythm. ? ?Repeat labs for chronic medical conditions first week of August.   ? ?Pt desires weight loss so did place referral to medical weight loss management.  Counseled patient on weight loss.  I recommended trying weight watchers app presently. ? ?Follow up early September or sooner if needed. ? ? ? ? ?

## 2022-01-06 NOTE — Telephone Encounter (Signed)
Pt states edward informed him to schedule lab appointment a week before follow up. Pt scheduled no active labs ordered  ?

## 2022-01-06 NOTE — Progress Notes (Signed)
? ?Subjective:  ? ? Patient ID: Richard Walker, male    DOB: 09-Jun-1956, 66 y.o.   MRN: [id number] ? ?HPI ? ?Pt in for follow up. ? ?Pt wants to loose weight. Pt states when Ramadan ended he gained back 25 lbs after he lost 20 lbs. Various times in past failed wt loss efforts. ? ? ?Vit d mild high recently. On 50,000 units weekly and 1000 iu otc daily. ? ?Mild elevated sugar in past. On metformin 500 mg twice daily. ? ?Paraxysmal atrial fibriallation. On flecanide and xarelto ? ?Htn- pt on metoprolol 25 mg twice daily. ? ?High cholesterol- he is on fenofirate 145 mg daiy and zetia 10 mg daily. ? ?2 weeks ago had chest pain that was sharp lasted about 15-20 minutes. These went away. 2 days later occurred again lasted 20 minutes. Both times felt sob ? ?Aortic atherosclerosis, in addition to left main and 3 vessel ?coronary artery disease. Please note that although the presence of ?coronary artery calcium documents the presence of coronary artery ?disease, the severity of this disease and any potential stenosis ?cannot be assessed on this non-gated CT examination. Assessment for ?potential risk factor modification, dietary therapy or pharmacologic ?therapy may be warranted, if clinically indicated. ? ? ? ?Review of Systems  ?Constitutional:  Negative for chills, fatigue and fever.  ?Respiratory:  Negative for cough, chest tightness and wheezing.   ?Cardiovascular:  Negative for chest pain and palpitations.  ?Gastrointestinal:  Negative for abdominal pain and constipation.  ?Genitourinary:  Negative for enuresis, flank pain and frequency.  ?Musculoskeletal:  Negative for back pain.  ?Skin:  Negative for rash.  ? ?Past Medical History:  ?Diagnosis Date  ? DDD (degenerative disc disease), lumbar   ? L3/L4  ? Diverticulosis   ? GERD (gastroesophageal reflux disease)   ? Hyperlipidemia   ? Low sperm motility   ? Pre-diabetes   ? Sleep apnea   ? uses cpap   ? SVT (supraventricular tachycardia) (HCC)   ? ?  ?Social History   ? ?Socioeconomic History  ? Marital status: Married  ?  Spouse name: Not on file  ? Number of children: Not on file  ? Years of education: Not on file  ? Highest education level: Not on file  ?Occupational History  ? Not on file  ?Tobacco Use  ? Smoking status: Former  ?  Packs/day: 1.00  ?  Years: 10.00  ?  Pack years: 10.00  ?  Types: Cigarettes  ?  Quit date: 611992  ?  Years since quitting: 31.3  ? Smokeless tobacco: Never  ? Tobacco comments:  ?  Uses Hookah Pipe occasionally  ?Vaping Use  ? Vaping Use: Never used  ?Substance and Sexual Activity  ? Alcohol use: No  ? Drug use: No  ? Sexual activity: Yes  ?Other Topics Concern  ? Not on file  ?Social History Narrative  ? 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 Children:  4 (3 Sons/1 Daughter)  ? Pets:  None   ? Living Situation: Lives with spouse and 4 children  ? Origin:  He was born in [place].  ? 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 Tobacco Use/Exposure:  Smokes "special" tobacco from his home country ([place]    ? Alcohol Use:  None  ? Drug Use:  None  ? Diet:  Regular  ? Exercise:  None  ? Hobbies:  Cards  ? ?Social Determinants of Health  ? ?Physicist, medicalinancial Resource  Strain: High Risk  ? Difficulty of Paying Living Expenses: Very hard  ?Food Insecurity: Food Insecurity Present  ? Worried About Programme researcher, broadcasting/film/video in the Last Year: Often true  ? Ran Out of Food in the Last Year: Often true  ?Transportation Needs: No Transportation Needs  ? Lack of Transportation (Medical): No  ? Lack of Transportation (Non-Medical): No  ?Physical Activity: Inactive  ? Days of Exercise per Week: 0 days  ? Minutes of Exercise per Session: 0 min  ?Stress: No Stress Concern Present  ? Feeling of Stress : Not at all  ?Social Connections: Not on file  ?Intimate Partner Violence: Not on file  ? ? ?Past Surgical History:  ?Procedure Laterality Date  ? 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 Procedure: . REPAIR OF LEFT INDEX AND LONG FINGER BY  FUSION /  REPAIR OF RING FINGER;  Surgeon: Mack Hook, MD;  Location: [facility] OR;  Service: Orthopedics;  Laterality: Left;  ? BACK SURGERY    ? CARDIAC CATHETERIZATION N/A 12/22/2015  ? Procedure: Left Heart Cath and Coronary Angiography;  Surgeon: Peter M Swaziland, MD;  Location: [facility] INVASIVE CV LAB;  Service: Cardiovascular;  Laterality: N/A;  ? CARDIAC ELECTROPHYSIOLOGY STUDY AND ABLATION    ? ELECTROPHYSIOLOGIC STUDY N/A 02/19/2016  ? Procedure: SVT Ablation;  Surgeon: Marinus Maw, MD;  Location: [facility] INVASIVE CV LAB;  Service: Cardiovascular;  Laterality: N/A;  ? septal reconstruction  2017  ? TURBINATE REDUCTION Bilateral 07/2016  ? WISDOM TOOTH EXTRACTION    ? ? ?Family History  ?Problem Relation Age of Onset  ? Hypertension Father   ? Food Allergy Son   ? Colon cancer Neg Hx   ? Stomach cancer Neg Hx   ? Allergic rhinitis Neg Hx   ? Angioedema Neg Hx   ? Asthma Neg Hx   ? Eczema Neg Hx   ? Immunodeficiency Neg Hx   ? Urticaria Neg Hx   ? ? ?Allergies  ?Allergen Reactions  ? Dust Mite Extract   ? Pork-Derived Products Swelling  ? Statins   ?  Muscle aches  ? Tree Extract Other (See Comments)  ?  Intolerance ?  ? ? ?Current Outpatient Medications on File Prior to Visit  ?Medication Sig Dispense Refill  ? acetaminophen (TYLENOL) 325 MG tablet Take 2 tablets (650 mg total) by mouth every 6 (six) hours. (Patient not taking: Reported on 12/23/2021)    ? albuterol (PROAIR HFA) 108 (90 Base) MCG/ACT inhaler Inhale 1-2 puffs into the lungs every 6 (six) hours as needed for wheezing or shortness of breath. 24 g 3  ? azelastine (ASTELIN) 0.1 % nasal spray 1-2 sprays per nostril 2 times daily as needed. 90 mL 3  ? budesonide-formoterol (SYMBICORT) 160-4.5 MCG/ACT inhaler Inhale 2 puffs into the lungs in the morning and at bedtime. 3 each 3  ? clotrimazole-betamethasone (LOTRISONE) cream Apply 1 application topically daily. (Patient not taking: Reported on 12/23/2021) 30 g 0  ? clotrimazole-betamethasone (LOTRISONE) cream Apply  1 application topically daily. (Patient not taking: Reported on 12/23/2021) 30 g 0  ? cyclobenzaprine (FLEXERIL) 10 MG tablet Take 1 tablet (10 mg total) by mouth 3 (three) times daily as needed. (Patient not taking: Reported on 12/23/2021) 45 tablet 1  ? diclofenac (VOLTAREN) 75 MG EC tablet Take 1 tablet (75 mg total) by mouth 2 (two) times daily. 90 tablet 2  ? ezetimibe (ZETIA) 10 MG tablet Take 1 tablet (10 mg total) by mouth daily. 30 tablet 11  ?  famotidine-calcium carbonate-magnesium hydroxide (PEPCID COMPLETE) 10-800-165 MG chewable tablet Chew by mouth.    ? fenofibrate (TRICOR) 145 MG tablet Take 1 tablet (145 mg total) by mouth daily. 90 tablet 3  ? flecainide (TAMBOCOR) 150 MG tablet TAKE 1 TABLET(150 MG) BY MOUTH TWICE DAILY 180 tablet 3  ? fluconazole (DIFLUCAN) 150 MG tablet Take 1 tablet (150 mg total) by mouth daily. 1 tablet 0  ? fluticasone (FLONASE) 50 MCG/ACT nasal spray Place 1 spray into both nostrils daily. 48 g 3  ? fluticasone (FLONASE) 50 MCG/ACT nasal spray SHAKE LIQUID AND USE 2 SPRAYS IN EACH NOSTRIL DAILY 48 g 2  ? gabapentin (NEURONTIN) 100 MG capsule Take 1 capsule (100 mg total) by mouth 3 (three) times daily. 90 capsule 0  ? guaifenesin (HUMIBID E) 400 MG TABS tablet Take 1 tablet 3 times daily as needed for chest congestion and cough 21 tablet 0  ? HYDROcodone bit-homatropine (HYCODAN) 5-1.5 MG/5ML syrup Take 5 mLs by mouth every 8 (eight) hours as needed for cough. 120 mL 0  ? ibuprofen (ADVIL) 600 MG tablet Take 1 tablet (600 mg total) by mouth every 8 (eight) hours as needed for up to 30 doses for fever, headache, mild pain or moderate pain (Inflammation). Take 1 tablet 3 times daily as needed for inflammation of upper airways and/or pain. 30 tablet 0  ? levocetirizine (XYZAL) 5 MG tablet TAKE 1 TABLET(5 MG) BY MOUTH EVERY EVENING (Patient not taking: Reported on 12/23/2021) 90 tablet 2  ? Magnesium 250 MG TABS Take 1 tablet by mouth once a week.    ? meclizine (ANTIVERT) 25 MG  tablet Take 1 tablet (25 mg total) by mouth 3 (three) times daily as needed for dizziness. 15 tablet 0  ? meloxicam (MOBIC) 15 MG tablet Take 1 tablet (15 mg total) by mouth daily. 30 tablet 1  ? metFORMIN (GL

## 2022-01-07 NOTE — Telephone Encounter (Signed)
Pt scheduled on wed 8/9 @ 10

## 2022-01-26 NOTE — Progress Notes (Signed)
PCP:  Esperanza Richters, PA-C Primary Cardiologist: None Electrophysiologist: Lewayne Bunting, MD   Richard Walker is a 66 y.o. male seen today for Lewayne Bunting, MD for routine electrophysiology followup.  Since last being seen in our clinic the patient reports two episode of chest pain. Described as sharp / pins and needles type pain that occurred at rest both times. Lasted for 10-20 minutes. No relief or aggravation by movement or activity. He has some SOB with moderate exertion with more of a chronic component.   he denies palpitations, PND, orthopnea, nausea, vomiting, dizziness, syncope, edema, weight gain, or early satiety.  Past Medical History:  Diagnosis Date   DDD (degenerative disc disease), lumbar    L3/L4   Diverticulosis    GERD (gastroesophageal reflux disease)    Hyperlipidemia    Low sperm motility    Pre-diabetes    Sleep apnea    uses cpap    SVT (supraventricular tachycardia) [facility])    Past Surgical History:  Procedure Laterality Date   AMPUTATION Left 07/15/2018   Procedure: . REPAIR OF LEFT INDEX AND LONG FINGER BY FUSION /  REPAIR OF RING FINGER;  Surgeon: Mack Hook, MD;  Location: [facility] OR;  Service: Orthopedics;  Laterality: Left;   BACK SURGERY     CARDIAC CATHETERIZATION N/A 12/22/2015   Procedure: Left Heart Cath and Coronary Angiography;  Surgeon: Peter M Swaziland, MD;  Location[facility] INVASIVE CV LAB;  Service: Cardiovascular;  Laterality: N/A;   CARDIAC ELECTROPHYSIOLOGY STUDY AND ABLATION     ELECTROPHYSIOLOGIC STUDY N/A 02/19/2016   Procedure: SVT Ablation;  Surgeon: Marinus Maw, MD;  Location: [facility] INVASIVE CV LAB;  Service: Cardiovascular;  Laterality: N/A;   septal reconstruction  2017   TURBINATE REDUCTION Bilateral 07/2016   WISDOM TOOTH EXTRACTION      Current Outpatient Medications  Medication Sig Dispense Refill   acetaminophen (TYLENOL) 325 MG tablet Take 2 tablets (650 mg total) by mouth every 6 (six) hours.     albuterol (PROAIR HFA) 108 (90  Base) MCG/ACT inhaler Inhale 1-2 puffs into the lungs every 6 (six) hours as needed for wheezing or shortness of breath. 24 g 3   azelastine (ASTELIN) 0.1 % nasal spray 1-2 sprays per nostril 2 times daily as needed. 90 mL 3   budesonide-formoterol (SYMBICORT) 160-4.5 MCG/ACT inhaler Inhale 2 puffs into the lungs in the morning and at bedtime. 3 each 3   famotidine-calcium carbonate-magnesium hydroxide (PEPCID COMPLETE) 10-800-165 MG chewable tablet Chew by mouth.     flecainide (TAMBOCOR) 150 MG tablet TAKE 1 TABLET(150 MG) BY MOUTH TWICE DAILY 180 tablet 3   fluconazole (DIFLUCAN) 150 MG tablet Take 1 tablet (150 mg total) by mouth daily. 1 tablet 0   fluticasone (FLONASE) 50 MCG/ACT nasal spray Place 1 spray into both nostrils daily. 48 g 3   fluticasone (FLONASE) 50 MCG/ACT nasal spray SHAKE LIQUID AND USE 2 SPRAYS IN EACH NOSTRIL DAILY 48 g 2   gabapentin (NEURONTIN) 100 MG capsule Take 1 capsule (100 mg total) by mouth 3 (three) times daily. 90 capsule 0   guaifenesin (HUMIBID E) 400 MG TABS tablet Take 1 tablet 3 times daily as needed for chest congestion and cough 21 tablet 0   HYDROcodone bit-homatropine (HYCODAN) 5-1.5 MG/5ML syrup Take 5 mLs by mouth every 8 (eight) hours as needed for cough. 120 mL 0   ibuprofen (ADVIL) 600 MG tablet Take 1 tablet (600 mg total) by mouth every 8 (eight) hours as needed  for up to 30 doses for fever, headache, mild pain or moderate pain (Inflammation). Take 1 tablet 3 times daily as needed for inflammation of upper airways and/or pain. 30 tablet 0   levocetirizine (XYZAL) 5 MG tablet TAKE 1 TABLET(5 MG) BY MOUTH EVERY EVENING 90 tablet 2   Magnesium 250 MG TABS Take 1 tablet by mouth once a week.     meloxicam (MOBIC) 15 MG tablet Take 1 tablet (15 mg total) by mouth daily. 30 tablet 1   metFORMIN (GLUCOPHAGE) 500 MG tablet TAKE 1 TABLET(500 MG) BY MOUTH TWICE DAILY WITH A MEAL 60 tablet 3   metoprolol tartrate (LOPRESSOR) 25 MG tablet Take one tablet by  mouth twice a day.  May take one additional tablet by mouth as needed for breakthrough palpitations. 270 tablet 3   montelukast (SINGULAIR) 10 MG tablet TAKE 1 TABLET BY MOUTH 1 TIME AT NIGHT FOR COUGHING OR WHEEZING 90 tablet 3   pantoprazole (PROTONIX) 40 MG tablet TAKE 1 TABLET(40 MG) BY MOUTH TWICE DAILY 60 tablet 3   PRESCRIPTION MEDICATION Inhale into the lungs at bedtime. CPAP     promethazine-dextromethorphan (PROMETHAZINE-DM) 6.25-15 MG/5ML syrup Take 5 mLs by mouth 4 (four) times daily as needed for cough. 180 mL 0   rivaroxaban (XARELTO) 20 MG TABS tablet TAKE 1 TABLET(20 MG) BY MOUTH DAILY WITH SUPPER 90 tablet 1   sildenafil (VIAGRA) 50 MG tablet Take 1 tablet (50 mg total) by mouth daily as needed for erectile dysfunction. 10 tablet 0   Vitamin D, Ergocalciferol, (DRISDOL) 1.25 MG (50000 UNIT) CAPS capsule Take 1 capsule (50,000 Units total) by mouth every 7 (seven) days. 8 capsule 0   Vitamin D, Ergocalciferol, (DRISDOL) 1.25 MG (50000 UNIT) CAPS capsule Take 1 capsule (50,000 Units total) by mouth every 7 (seven) days. 12 capsule 0   Vitamin D, Ergocalciferol, (DRISDOL) 1.25 MG (50000 UNIT) CAPS capsule 1 tab po weekly. 12 capsule 0   fenofibrate (TRICOR) 145 MG tablet Take 1 tablet (145 mg total) by mouth daily. 90 tablet 3   No current facility-administered medications for this visit.    Allergies  Allergen Reactions   Dust Mite Extract    Pork-Derived Products Swelling   Statins     Muscle aches   Tree Extract Other (See Comments)    Intolerance     Social History   Socioeconomic History   Marital status: Married    Spouse name: Not on file   Number of children: Not on file   Years of education: Not on file   Highest education level: Not on file  Occupational History   Not on file  Tobacco Use   Smoking status: Former    Packs/day: 1.00    Years: 10.00    Total pack years: 10.00    Types: Cigarettes    Quit date: 73    Years since quitting: 31.4    Smokeless tobacco: Never   Tobacco comments:    Uses Hookah Pipe occasionally  Vaping Use   Vaping Use: Never used  Substance and Sexual Activity   Alcohol use: No   Drug use: No   Sexual activity: Yes  Other Topics Concern   Not on file  Social History Narrative   Marital Status: Married Engineer, drilling)   Children:  4 (3 Sons/1 Daughter)   Pets:  None    Living Situation: Lives with spouse and 4 children   Origin:  He was born in [place].   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     Alcohol Use:  None   Drug Use:  None   Diet:  Regular   Exercise:  None   Hobbies:  Cards   Social Determinants of Health   Financial Resource Strain: High Risk (12/25/2021)   Overall Financial Resource Strain (CARDIA)    Difficulty of Paying Living Expenses: Very hard  Food Insecurity: Food Insecurity Present (12/25/2021)   Hunger Vital Sign    Worried About Running Out of Food in the Last Year: Often true    Ran Out of Food in the Last Year: Often true  Transportation Needs: No Transportation Needs (12/23/2021)   PRAPARE - Administrator, Civil ServiceTransportation    Lack of Transportation (Medical): No    Lack of Transportation (Non-Medical): No  Physical Activity: Inactive (12/23/2021)   Exercise Vital Sign    Days of Exercise per Week: 0 days    Minutes of Exercise per Session: 0 min  Stress: No Stress Concern Present (12/23/2021)   [employer] - Occupational Stress Questionnaire    Feeling of Stress : Not at all  Social Connections: Not on file  Intimate Partner Violence: Not on file     Review of Systems: All other systems reviewed and are otherwise negative except as noted above.  Physical Exam: Vitals:   01/28/22 1013  BP: 130/72  Pulse: 72  SpO2: 96%  Weight: 255 lb (115.7 kg)  Height: 5\' 9"  (1.753 m)    GEN- The patient is well appearing, alert  and oriented x 3 today.   HEENT: normocephalic, atraumatic; sclera clear, conjunctiva pink; hearing intact; oropharynx clear; neck supple, no JVP Lymph- no cervical lymphadenopathy Lungs- Clear to ausculation bilaterally, normal work of breathing.  No wheezes, rales, rhonchi Heart- Regular rate and rhythm, no murmurs, rubs or gallops, PMI not laterally displaced GI- soft, non-tender, non-distended, bowel sounds present, no hepatosplenomegaly Extremities- no clubbing, cyanosis, or edema; DP/PT/radial pulses 2+ bilaterally MS- no significant deformity or atrophy Skin- warm and dry, no rash or lesion Psych- euthymic mood, full affect Neuro- strength and sensation are intact  EKG is not ordered. Personal review of EKG from  01/06/2022  shows NSR at 60 bpm  Additional studies reviewed include: Previous EP and PCP notes.   Assessment and Plan:  1. H/o SVT s/p ablation Without recurrence  2. PAF/flutter Maintaining NSR by symptoms and recent EKG Monitor with occasional PACs and PVCs but no sustained arryhtmias.  3. Dyspnea Echo 10/29/2021 LVEF 60-65%, Grade 2 DD Some concern for anginal equivalent. Given non-obstructive CAD on cath 2017, will perform Lexiscan Myoview.  If concerning for scar will need to stop flecainide and see Dr. Ladona Ridgelaylor to discuss other options. He has previously reviewed ablation consideration for his flutter/fib.   Follow up with Dr. Ladona Ridgelaylor in 6 months, sooner with issues or abnormal stress.    Graciella FreerMichael Andrew Solon Alban, PA-C  01/28/22 10:26 AM

## 2022-01-28 ENCOUNTER — Ambulatory Visit (INDEPENDENT_AMBULATORY_CARE_PROVIDER_SITE_OTHER): Payer: Medicare HMO | Admitting: Student

## 2022-01-28 ENCOUNTER — Encounter: Payer: Self-pay | Admitting: Student

## 2022-01-28 ENCOUNTER — Telehealth (HOSPITAL_COMMUNITY): Payer: Self-pay | Admitting: *Deleted

## 2022-01-28 ENCOUNTER — Encounter (HOSPITAL_COMMUNITY): Payer: Self-pay | Admitting: *Deleted

## 2022-01-28 VITALS — BP 130/72 | HR 72 | Ht 69.0 in | Wt 255.0 lb

## 2022-01-28 DIAGNOSIS — I10 Essential (primary) hypertension: Secondary | ICD-10-CM | POA: Diagnosis not present

## 2022-01-28 DIAGNOSIS — I4892 Unspecified atrial flutter: Secondary | ICD-10-CM | POA: Diagnosis not present

## 2022-01-28 DIAGNOSIS — R0600 Dyspnea, unspecified: Secondary | ICD-10-CM

## 2022-01-28 DIAGNOSIS — I480 Paroxysmal atrial fibrillation: Secondary | ICD-10-CM

## 2022-01-28 NOTE — Patient Instructions (Signed)
Medication Instructions:  Your physician recommends that you continue on your current medications as directed. Please refer to the Current Medication list given to you today.  *If you need a refill on your cardiac medications before your next appointment, please call your pharmacy*   Lab Work: None If you have labs (blood work) drawn today and your tests are completely normal, you will receive your results only by: MyChart Message (if you have MyChart) OR A paper copy in the mail If you have any lab test that is abnormal or we need to change your treatment, we will call you to review the results.   Testing/Procedures: Your physician has requested that you have a lexiscan myoview. For further information please visit [email or web address] Please follow instruction sheet, as given.   Follow-Up: At [facility], you and your health needs are our priority.  As part of our continuing mission to provide you with exceptional heart care, we have created designated Provider Care Teams.  These Care Teams include your primary Cardiologist (physician) and Advanced Practice Providers (APPs -  Physician Assistants and Nurse Practitioners) who all work together to provide you with the care you need, when you need it.  Your next appointment:   As scheduled  Other Instructions See letter for [facility] Instructions

## 2022-01-28 NOTE — Telephone Encounter (Signed)
Attempted to call patient regarding upcoming appointment. No Answer, Letter sent via mychart.  Richard Walker

## 2022-01-29 ENCOUNTER — Ambulatory Visit (INDEPENDENT_AMBULATORY_CARE_PROVIDER_SITE_OTHER): Payer: Medicare HMO | Admitting: Family Medicine

## 2022-01-29 ENCOUNTER — Encounter: Payer: Self-pay | Admitting: Family Medicine

## 2022-01-29 VITALS — BP 98/68 | HR 60 | Temp 99.1°F | Resp 18 | Ht 69.0 in | Wt 253.8 lb

## 2022-01-29 DIAGNOSIS — R051 Acute cough: Secondary | ICD-10-CM | POA: Diagnosis not present

## 2022-01-29 DIAGNOSIS — J40 Bronchitis, not specified as acute or chronic: Secondary | ICD-10-CM | POA: Diagnosis not present

## 2022-01-29 MED ORDER — PROMETHAZINE-DM 6.25-15 MG/5ML PO SYRP: 5.0000 mL | ORAL_SOLUTION | Freq: Four times a day (QID) | ORAL | 0 refills | Status: DC | PRN

## 2022-01-29 MED ORDER — AZITHROMYCIN 250 MG PO TABS: ORAL_TABLET | ORAL | 0 refills | Status: DC

## 2022-01-29 NOTE — Patient Instructions (Signed)
Acute Bronchitis, Adult ? ?Acute bronchitis is sudden inflammation of the main airways (bronchi) that come off the windpipe (trachea) in the lungs. The swelling causes the airways to get smaller and make more mucus than normal. This can make it hard to breathe and can cause coughing or noisy breathing (wheezing). ?Acute bronchitis may last several weeks. The cough may last longer. Allergies, asthma, and exposure to smoke may make the condition worse. ?What are the causes? ?This condition can be caused by germs and by substances that irritate the lungs, including: ?Cold and flu viruses. The most common cause of this condition is the virus that causes the common cold. ?Bacteria. This is less common. ?Breathing in substances that irritate the lungs, including: ?Smoke from cigarettes and other forms of tobacco. ?Dust and pollen. ?Fumes from household cleaning products, gases, or burned fuel. ?Indoor or outdoor air pollution. ?What increases the risk? ?The following factors may make you more likely to develop this condition: ?A weak body's defense system, also called the immune system. ?A condition that affects your lungs and breathing, such as asthma. ?What are the signs or symptoms? ?Common symptoms of this condition include: ?Coughing. This may bring up clear, yellow, or green mucus from your lungs (sputum). ?Wheezing. ?Runny or stuffy nose. ?Having too much mucus in your lungs (chest congestion). ?Shortness of breath. ?Aches and pains, including sore throat or chest. ?How is this diagnosed? ?This condition is usually diagnosed based on: ?Your symptoms and medical history. ?A physical exam. ?You may also have other tests, including tests to rule out other conditions, such as pneumonia. These tests include: ?A test of lung function. ?Test of a mucus sample to look for the presence of bacteria. ?Tests to check the oxygen level in your blood. ?Blood tests. ?Chest X-ray. ?How is this treated? ?Most cases of acute  bronchitis clear up over time without treatment. Your health care provider may recommend: ?Drinking more fluids to help thin your mucus so it is easier to cough up. ?Taking inhaled medicine (inhaler) to improve air flow in and out of your lungs. ?Using a vaporizer or a humidifier. These are machines that add water to the air to help you breathe better. ?Taking a medicine that thins mucus and clears congestion (expectorant). ?Taking a medicine that prevents or stops coughing (cough suppressant). ?It is notcommon to take an antibiotic medicine for this condition. ?Follow these instructions at home: ? ?Take over-the-counter and prescription medicines only as told by your health care provider. ?Use an inhaler, vaporizer, or humidifier as told by your health care provider. ?Take two teaspoons (10 mL) of honey at bedtime to lessen coughing at night. ?Drink enough fluid to keep your urine pale yellow. ?Do not use any products that contain nicotine or tobacco. These products include cigarettes, chewing tobacco, and vaping devices, such as e-cigarettes. If you need help quitting, ask your health care provider. ?Get plenty of rest. ?Return to your normal activities as told by your health care provider. Ask your health care provider what activities are safe for you. ?Keep all follow-up visits. This is important. ?How is this prevented? ?To lower your risk of getting this condition again: ?Wash your hands often with soap and water for at least 20 seconds. If soap and water are not available, use hand sanitizer. ?Avoid contact with people who have cold symptoms. ?Try not to touch your mouth, nose, or eyes with your hands. ?Avoid breathing in smoke or chemical fumes. Breathing smoke or chemical fumes will make your   condition worse. ?Get the flu shot every year. ?Contact a health care provider if: ?Your symptoms do not improve after 2 weeks. ?You have trouble coughing up the mucus. ?Your cough keeps you awake at night. ?You have a  fever. ?Get help right away if you: ?Cough up blood. ?Feel pain in your chest. ?Have severe shortness of breath. ?Faint or keep feeling like you are going to faint. ?Have a severe headache. ?Have a fever or chills that get worse. ?These symptoms may represent a serious problem that is an emergency. Do not wait to see if the symptoms will go away. Get medical help right away. Call your local emergency services (911 in the [place] Do not drive yourself to the hospital. ?Summary ?Acute bronchitis is inflammation of the main airways (bronchi) that come off the windpipe (trachea) in the lungs. The swelling causes the airways to get smaller and make more mucus than normal. ?Drinking more fluids can help thin your mucus so it is easier to cough up. ?Take over-the-counter and prescription medicines only as told by your health care provider. ?Do not use any products that contain nicotine or tobacco. These products include cigarettes, chewing tobacco, and vaping devices, such as e-cigarettes. If you need help quitting, ask your health care provider. ?Contact a health care provider if your symptoms do not improve after 2 weeks. ?This information is not intended to replace advice given to you by your health care provider. Make sure you discuss any questions you have with your health care provider. ?Document Revised: 12/10/2020 Document Reviewed: 12/10/2020 ?Elsevier Patient Education ? 2023 Elsevier Inc. ? ?

## 2022-01-29 NOTE — Progress Notes (Signed)
Established Patient Office Visit  Subjective   Patient ID: Richard Walker, male    DOB: [birth year]  Age: 66 y.o. MRN: [id number]  Chief Complaint  Patient presents with   Cough    Pt states sxs started about 2 days ago. Pt states cough is worse at night, having dry mouth, and headaches.     HPI Pt is here is c/o cough x 2 days + productive   + wheezing x2 days--- other people at home sick as well.  Pt states fever comes and goes   no otc meds--- pt stopped his inhalers because he was afraid it would affect his heart.    Patient Active Problem List   Diagnosis Date Noted   Bronchitis 01/29/2022   OSA on CPAP 02/19/2021   Bite, snake 01/28/2021   Neck pain 11/13/2020   Vertigo 11/13/2020   Hypertension 11/03/2020   Contusion of bone 10/23/2020   Primary osteoarthritis of left knee 10/23/2020   Concussion with no loss of consciousness 10/23/2020   Paroxysmal atrial fibrillation (HCC) 05/22/2019   Unspecified atrial flutter (HCC) 05/22/2019   Pain in right foot 03/30/2018   Perennial and seasonal allergic rhinitis 11/09/2017   History of food allergy 11/09/2017   Wheezing 11/09/2017   Dyspnea/wheezing 10/31/2017   Cough 10/31/2017   Bronchitis, mucopurulent recurrent (HCC) 09/07/2017   SVT (supraventricular tachycardia) (HCC) 04/23/2015   Abdominal pain, epigastric 06/03/2013   Unspecified vitamin D deficiency 04/09/2013   Esophageal reflux 04/09/2013   Hepatitis B carrier (HCC) 04/09/2013   Other and unspecified hyperlipidemia 04/09/2013   Unspecified sleep apnea 04/09/2013   Past Medical History:  Diagnosis Date   DDD (degenerative disc disease), lumbar    L3/L4   Diverticulosis    GERD (gastroesophageal reflux disease)    Hyperlipidemia    Low sperm motility    Pre-diabetes    Sleep apnea    uses cpap    SVT (supraventricular tachycardia) [facility])    Past Surgical History:  Procedure Laterality Date   AMPUTATION Left 07/15/2018   Procedure: . REPAIR OF LEFT  INDEX AND LONG FINGER BY FUSION /  REPAIR OF RING FINGER;  Surgeon: Mack Hook, MD;  Location: [facility] OR;  Service: Orthopedics;  Laterality: Left;   BACK SURGERY     CARDIAC CATHETERIZATION N/A 12/22/2015   Procedure: Left Heart Cath and Coronary Angiography;  Surgeon: Peter M Swaziland, MD;  Location: [facility] INVASIVE CV LAB;  Service: Cardiovascular;  Laterality: N/A;   CARDIAC ELECTROPHYSIOLOGY STUDY AND ABLATION     ELECTROPHYSIOLOGIC STUDY N/A 02/19/2016   Procedure: SVT Ablation;  Surgeon: Marinus Maw, MD;  Location: [facility] INVASIVE CV LAB;  Service: Cardiovascular;  Laterality: N/A;   septal reconstruction  2017   TURBINATE REDUCTION Bilateral 07/2016   WISDOM TOOTH EXTRACTION     Social History   Tobacco Use   Smoking status: Former    Packs/day: 1.00    Years: 10.00    Total pack years: 10.00    Types: Cigarettes    Quit date: 1992    Years since quitting: 31.4   Smokeless tobacco: Never   Tobacco comments:    Uses Hookah Pipe occasionally  Vaping Use   Vaping Use: Never used  Substance Use Topics   Alcohol use: No   Drug use: No   Social History   Socioeconomic History   Marital status: Married    Spouse name: Not on file   Number of children: Not on file  Years of education: Not on file   Highest education level: Not on file  Occupational History   Not on file  Tobacco Use   Smoking status: Former    Packs/day: 1.00    Years: 10.00    Total pack years: 10.00    Types: Cigarettes    Quit date: 91992    Years since quitting: 31.4   Smokeless tobacco: Never   Tobacco comments:    Uses Hookah Pipe occasionally  Vaping Use   Vaping Use: Never used  Substance and Sexual Activity   Alcohol use: No   Drug use: No   Sexual activity: Yes  Other Topics Concern   Not on file  Social History Narrative   Marital Status: Married Engineer, 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     Alcohol Use:  None   Drug Use:  None   Diet:  Regular   Exercise:  None   Hobbies:  Cards   Social Determinants of Health   Financial Resource Strain: High Risk (12/25/2021)   Overall Financial Resource Strain (CARDIA)    Difficulty of Paying Living Expenses: Very hard  Food Insecurity: Food Insecurity Present (12/25/2021)   Hunger Vital Sign    Worried About Running Out of Food in the Last Year: Often true    Ran Out of Food in the Last Year: Often true  Transportation Needs: No Transportation Needs (12/23/2021)   PRAPARE - Administrator, Civil ServiceTransportation    Lack of Transportation (Medical): No    Lack of Transportation (Non-Medical): No  Physical Activity: Inactive (12/23/2021)   Exercise Vital Sign    Days of Exercise per Week: 0 days    Minutes of Exercise per Session: 0 min  Stress: No Stress Concern Present (12/23/2021)   [employer] - Occupational Stress Questionnaire    Feeling of Stress : Not at all  Social Connections: Not on file  Intimate Partner Violence: Not on file   Family Status  Relation Name Status   Father  Deceased   Mother  Deceased   MGM  Deceased   MGF  Deceased   PGM  Deceased   PGF  Deceased   Daughter  Alive   Son  Alive   Neg Hx  (Not Specified)   Family History  Problem Relation Age of Onset   Hypertension Father    Food Allergy Son    Colon cancer Neg Hx    Stomach cancer Neg Hx    Allergic rhinitis Neg Hx    Angioedema Neg Hx    Asthma Neg Hx    Eczema Neg Hx    Immunodeficiency Neg Hx    Urticaria Neg Hx    Allergies  Allergen Reactions   Dust Mite Extract    Pork-Derived Products Swelling   Statins     Muscle aches   Tree Extract Other (See Comments)    Intolerance       Review of Systems  Constitutional:  Negative for fever  and malaise/fatigue.  HENT:  Negative for congestion.   Eyes:  Negative for blurred vision.  Respiratory:  Negative for shortness of breath.   Cardiovascular:  Negative for chest pain, palpitations  and leg swelling.  Gastrointestinal:  Negative for abdominal pain, blood in stool and nausea.  Genitourinary:  Negative for dysuria and frequency.  Musculoskeletal:  Negative for falls.  Skin:  Negative for rash.  Neurological:  Negative for dizziness, loss of consciousness and headaches.  Endo/Heme/Allergies:  Negative for environmental allergies.  Psychiatric/Behavioral:  Negative for depression. The patient is not nervous/anxious.       Objective:     BP 98/68 (BP Location: Left Arm, Patient Position: Sitting, Cuff Size: Large)   Pulse 60   Temp 99.1 F (37.3 C) (Oral)   Resp 18   Ht 5\' 9"  (1.753 m)   Wt 253 lb 12.8 oz (115.1 kg)   SpO2 98%   BMI 37.48 kg/m  BP Readings from Last 3 Encounters:  01/29/22 98/68  01/28/22 130/72  01/06/22 130/70   Wt Readings from Last 3 Encounters:  01/29/22 253 lb 12.8 oz (115.1 kg)  01/28/22 255 lb (115.7 kg)  01/06/22 253 lb (114.8 kg)      Physical Exam Vitals and nursing note reviewed.  Constitutional:      Appearance: He is well-developed.  HENT:     Head: Normocephalic and atraumatic.  Eyes:     Pupils: Pupils are equal, round, and reactive to light.  Neck:     Thyroid: No thyromegaly.  Cardiovascular:     Rate and Rhythm: Normal rate and regular rhythm.     Heart sounds: No murmur heard. Pulmonary:     Effort: Pulmonary effort is normal. No respiratory distress.     Breath sounds: Normal breath sounds. No wheezing or rales.  Chest:     Chest wall: No tenderness.  Musculoskeletal:        General: No tenderness.     Cervical back: Normal range of motion and neck supple.  Skin:    General: Skin is warm and dry.  Neurological:     Mental Status: He is alert and oriented to person, place, and time.  Psychiatric:         Behavior: Behavior normal.        Thought Content: Thought content normal.        Judgment: Judgment normal.      No results found for any visits on 01/29/22.    The 10-year ASCVD risk score (Arnett DK, et al., 2019) is: 10.3%    Assessment & Plan:   Problem List Items Addressed This Visit       Unprioritized   Cough   Relevant Medications   promethazine-dextromethorphan (PROMETHAZINE-DM) 6.25-15 MG/5ML syrup   Bronchitis - Primary    z pak and cough med per orders  F/u next week if no better       Relevant Medications   azithromycin (ZITHROMAX Z-PAK) 250 MG tablet    Return if symptoms worsen or fail to improve.    10-25-1990, DO

## 2022-01-29 NOTE — Assessment & Plan Note (Signed)
z pak and cough med per orders  F/u next week if no better

## 2022-02-01 ENCOUNTER — Ambulatory Visit (HOSPITAL_COMMUNITY): Payer: Medicare HMO | Attending: Student

## 2022-02-01 DIAGNOSIS — R0600 Dyspnea, unspecified: Secondary | ICD-10-CM | POA: Insufficient documentation

## 2022-02-01 LAB — MYOCARDIAL PERFUSION IMAGING
LV dias vol: 98 mL (ref 62–150)
LV sys vol: 43 mL
Nuc Stress EF: 56 %
Peak HR: 80 {beats}/min
Rest HR: 58 {beats}/min
Rest Nuclear Isotope Dose: 8.7 mCi
SDS: 2
SRS: 0
SSS: 2
ST Depression (mm): 0 mm
Stress Nuclear Isotope Dose: 29.5 mCi
TID: 1.12

## 2022-02-01 MED ORDER — REGADENOSON 0.4 MG/5ML IV SOLN
0.4000 mg | Freq: Once | INTRAVENOUS | Status: AC
  Administered 2022-02-01: 0.4 mg via INTRAVENOUS

## 2022-02-01 MED ORDER — TECHNETIUM TC 99M TETROFOSMIN IV KIT
29.5000 | PACK | Freq: Once | INTRAVENOUS | Status: AC | PRN
  Administered 2022-02-01: 29.5 via INTRAVENOUS

## 2022-02-01 MED ORDER — TECHNETIUM TC 99M TETROFOSMIN IV KIT
8.7000 | PACK | Freq: Once | INTRAVENOUS | Status: AC | PRN
  Administered 2022-02-01: 8.7 via INTRAVENOUS

## 2022-02-02 ENCOUNTER — Other Ambulatory Visit: Payer: Self-pay

## 2022-02-02 ENCOUNTER — Ambulatory Visit (INDEPENDENT_AMBULATORY_CARE_PROVIDER_SITE_OTHER): Payer: Medicare HMO | Admitting: Medical

## 2022-02-02 VITALS — BP 133/77 | HR 63 | Temp 97.0°F | Resp 16 | Ht 69.0 in | Wt 256.0 lb

## 2022-02-02 DIAGNOSIS — J40 Bronchitis, not specified as acute or chronic: Secondary | ICD-10-CM | POA: Diagnosis not present

## 2022-02-02 DIAGNOSIS — J0100 Acute maxillary sinusitis, unspecified: Secondary | ICD-10-CM | POA: Diagnosis not present

## 2022-02-02 DIAGNOSIS — R051 Acute cough: Secondary | ICD-10-CM | POA: Diagnosis not present

## 2022-02-02 MED ORDER — SILDENAFIL CITRATE 20 MG PO TABS: ORAL_TABLET | ORAL | 0 refills | Status: DC

## 2022-02-02 MED ORDER — BENZONATATE 100 MG PO CAPS: 100.0000 mg | ORAL_CAPSULE | Freq: Three times a day (TID) | ORAL | 0 refills | Status: DC | PRN

## 2022-02-02 MED ORDER — FLUTICASONE PROPIONATE 50 MCG/ACT NA SUSP: 2.0000 | Freq: Every day | NASAL | 1 refills | Status: DC

## 2022-02-02 MED ORDER — CEFTRIAXONE SODIUM 1 G IJ SOLR
1.0000 g | Freq: Once | INTRAMUSCULAR | Status: AC
  Administered 2022-02-02: 1 g via INTRAMUSCULAR

## 2022-02-02 NOTE — Addendum Note (Signed)
Addended by: Kathi Ludwig on: 02/02/2022 03:02 PM   Modules accepted: Orders

## 2022-02-02 NOTE — Addendum Note (Signed)
Addended by: Anabel Halon on: 02/02/2022 02:39 PM   Modules accepted: Orders

## 2022-02-02 NOTE — Patient Instructions (Addendum)
Bronchitis and sinus infection with residual cough. Continue azithromycin until finished. Will add rocephin 1 gram IM injection today.  For cough benzonatate for cough and flonase for nasal congestion.  I think your sedated feeling and fatigue was from phenergan.  Update me on Thursday afternoon. If still having significant sinus pressure or chest congestion may add on additional oral antibiotic.  For ED rx sildanefil 20 mg tab 2-5 1 hour prior to sex.  Follow up in 7-10 days or sooner if any signs/symptoms persist.

## 2022-02-02 NOTE — Progress Notes (Signed)
Subjective:    Patient ID: Richard Walker, male    DOB: 1956-02-07, 66 y.o.   MRN: [id number]  HPI  Pt in for follow up. Pt tx for bronchitis. Since last visit he continue to have productive cough and his sinuses are hurting.   Pt state cough improved with promethazine dm.  Also took bronchitis.  Tomorrow will be last day of antibiotic.  Pt also states needs pneumonia vaccine.  Pt has ED. He states viagra recently was to expensive.    Review of Systems  Constitutional:  Positive for fatigue. Negative for chills and fever.       When using phenergan  HENT:  Positive for congestion, sinus pressure and sinus pain.   Respiratory:  Positive for cough. Negative for shortness of breath, wheezing and stridor.   Cardiovascular:  Negative for chest pain and palpitations.  Gastrointestinal:  Negative for abdominal pain.  Genitourinary:  Negative for dysuria, flank pain and frequency.  Musculoskeletal:  Negative for back pain and joint swelling.  Neurological:  Negative for dizziness, seizures, syncope, speech difficulty, weakness, light-headedness and headaches.  Hematological:  Negative for adenopathy. Does not bruise/bleed easily.  Psychiatric/Behavioral:  Negative for behavioral problems and confusion.    Past Medical History:  Diagnosis Date   DDD (degenerative disc disease), lumbar    L3/L4   Diverticulosis    GERD (gastroesophageal reflux disease)    Hyperlipidemia    Low sperm motility    Pre-diabetes    Sleep apnea    uses cpap    SVT (supraventricular tachycardia) (HCC)      Social History   Socioeconomic History   Marital status: Married    Spouse name: Not on file   Number of children: Not on file   Years of education: Not on file   Highest education level: Not on file  Occupational History   Not on file  Tobacco Use   Smoking status: Former    Packs/day: 1.00    Years: 10.00    Total pack years: 10.00    Types: Cigarettes    Quit date: 951992    Years  since quitting: 31.4   Smokeless tobacco: Never   Tobacco comments:    Uses Hookah Pipe occasionally  Vaping Use   Vaping Use: Never used  Substance and Sexual Activity   Alcohol use: No   Drug use: No   Sexual activity: Yes  Other Topics Concern   Not on file  Social History Narrative   Marital Status: Married Engineer, 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     Alcohol Use:  None   Drug Use:  None   Diet:  Regular   Exercise:  None   Hobbies:  Cards   Social Determinants of Health   Financial Resource Strain: High Risk (12/25/2021)   Overall Financial Resource Strain (CARDIA)    Difficulty of Paying Living Expenses: Very hard  Food Insecurity: Food Insecurity Present (12/25/2021)   Hunger Vital Sign    Worried About Running Out of Food in the Last Year: Often true    Ran Out of Food in the Last Year: Often true  Transportation Needs: No Transportation Needs (12/23/2021)  PRAPARE - Hydrologist (Medical): No    Lack of Transportation (Non-Medical): No  Physical Activity: Inactive (12/23/2021)   Exercise Vital Sign    Days of Exercise per Week: 0 days    Minutes of Exercise per Session: 0 min  Stress: No Stress Concern Present (12/23/2021)   [place]    Feeling of Stress : Not at all  Social Connections: Not on file  Intimate Partner Violence: Not on file    Past Surgical History:  Procedure Laterality Date   AMPUTATION Left 07/15/2018   Procedure: . REPAIR OF LEFT INDEX AND LONG FINGER BY FUSION /  REPAIR OF RING FINGER;  Surgeon: Milly Jakob, MD;  Location: [facility];  Service: Orthopedics;  Laterality:  Left;   BACK SURGERY     CARDIAC CATHETERIZATION N/A 12/22/2015   Procedure: Left Heart Cath and Coronary Angiography;  Surgeon: Peter M Martinique, MD;  Location: [place] [place] CV LAB;  Service: Cardiovascular;  Laterality: N/A;   CARDIAC ELECTROPHYSIOLOGY STUDY AND ABLATION     ELECTROPHYSIOLOGIC STUDY N/A 02/19/2016   Procedure: SVT Ablation;  Surgeon: Evans Lance, MD;  Location: [place] CV LAB;  Service: Cardiovascular;  Laterality: N/A;   septal reconstruction  2017   TURBINATE REDUCTION Bilateral 07/2016   WISDOM TOOTH EXTRACTION      Family History  Problem Relation Age of Onset   Hypertension Father    Food Allergy Son    Colon cancer Neg Hx    Stomach cancer Neg Hx    Allergic rhinitis Neg Hx    Angioedema Neg Hx    Asthma Neg Hx    Eczema Neg Hx    Immunodeficiency Neg Hx    Urticaria Neg Hx     Allergies  Allergen Reactions   Dust Mite Extract    Pork-Derived Products Swelling   Statins     Muscle aches   Tree Extract Other (See Comments)    Intolerance     Current Outpatient Medications on File Prior to Visit  Medication Sig Dispense Refill   acetaminophen (TYLENOL) 325 MG tablet Take 2 tablets (650 mg total) by mouth every 6 (six) hours.     albuterol (PROAIR HFA) 108 (90 Base) MCG/ACT inhaler Inhale 1-2 puffs into the lungs every 6 (six) hours as needed for wheezing or shortness of breath. 24 g 3   azelastine (ASTELIN) 0.1 % nasal spray 1-2 sprays per nostril 2 times daily as needed. 90 mL 3   azithromycin (ZITHROMAX Z-PAK) 250 MG tablet As directed 6 each 0   budesonide-formoterol (SYMBICORT) 160-4.5 MCG/ACT inhaler Inhale 2 puffs into the lungs in the morning and at bedtime. 3 each 3   famotidine-calcium carbonate-magnesium hydroxide (PEPCID COMPLETE) 10-800-165 MG chewable tablet Chew by mouth.     flecainide (TAMBOCOR) 150 MG tablet TAKE 1 TABLET(150 MG) BY MOUTH TWICE DAILY 180 tablet 3   fluticasone (FLONASE) 50 MCG/ACT nasal spray Place 1 spray into  both nostrils daily. 48 g 3   fluticasone (FLONASE) 50 MCG/ACT nasal spray SHAKE LIQUID AND USE 2 SPRAYS IN EACH NOSTRIL DAILY 48 g 2   gabapentin (NEURONTIN) 100 MG capsule Take 1 capsule (100 mg total) by mouth 3 (three) times daily. 90 capsule 0   guaifenesin (HUMIBID E) 400 MG TABS tablet Take 1 tablet 3 times daily as needed for chest congestion and cough 21 tablet 0   ibuprofen (ADVIL) 600 MG tablet Take  1 tablet (600 mg total) by mouth every 8 (eight) hours as needed for up to 30 doses for fever, headache, mild pain or moderate pain (Inflammation). Take 1 tablet 3 times daily as needed for inflammation of upper airways and/or pain. 30 tablet 0   levocetirizine (XYZAL) 5 MG tablet TAKE 1 TABLET(5 MG) BY MOUTH EVERY EVENING 90 tablet 2   Magnesium 250 MG TABS Take 1 tablet by mouth once a week.     meloxicam (MOBIC) 15 MG tablet Take 1 tablet (15 mg total) by mouth daily. 30 tablet 1   metFORMIN (GLUCOPHAGE) 500 MG tablet TAKE 1 TABLET(500 MG) BY MOUTH TWICE DAILY WITH A MEAL 60 tablet 3   metoprolol tartrate (LOPRESSOR) 25 MG tablet Take one tablet by mouth twice a day.  May take one additional tablet by mouth as needed for breakthrough palpitations. 270 tablet 3   montelukast (SINGULAIR) 10 MG tablet TAKE 1 TABLET BY MOUTH 1 TIME AT NIGHT FOR COUGHING OR WHEEZING 90 tablet 3   pantoprazole (PROTONIX) 40 MG tablet TAKE 1 TABLET(40 MG) BY MOUTH TWICE DAILY 60 tablet 3   PRESCRIPTION MEDICATION Inhale into the lungs at bedtime. CPAP     promethazine-dextromethorphan (PROMETHAZINE-DM) 6.25-15 MG/5ML syrup Take 5 mLs by mouth 4 (four) times daily as needed for cough. 180 mL 0   rivaroxaban (XARELTO) 20 MG TABS tablet TAKE 1 TABLET(20 MG) BY MOUTH DAILY WITH SUPPER 90 tablet 1   Vitamin D, Ergocalciferol, (DRISDOL) 1.25 MG (50000 UNIT) CAPS capsule Take 1 capsule (50,000 Units total) by mouth every 7 (seven) days. 8 capsule 0   Vitamin D, Ergocalciferol, (DRISDOL) 1.25 MG (50000 UNIT) CAPS capsule  Take 1 capsule (50,000 Units total) by mouth every 7 (seven) days. 12 capsule 0   Vitamin D, Ergocalciferol, (DRISDOL) 1.25 MG (50000 UNIT) CAPS capsule 1 tab po weekly. 12 capsule 0   fenofibrate (TRICOR) 145 MG tablet Take 1 tablet (145 mg total) by mouth daily. 90 tablet 3   No current facility-administered medications on file prior to visit.    BP 133/77 (BP Location: Right Arm, Patient Position: Sitting, Cuff Size: Normal)   Pulse 63   Temp (!) 97 F (36.1 C) (Oral)   Resp 16   Ht 5\' 9"  (1.753 m)   Wt 256 lb (116.1 kg)   SpO2 99%   BMI 37.80 kg/m        Objective:   Physical Exam  General- No acute distress. Pleasant patient. Neck- Full range of motion, no jvd Lungs- Clear, even and unlabored. Heart- regular rate and rhythm. Neurologic- CNII- XII grossly intact.  Heent- frontal and maxillary sinus pressure       Assessment & Plan:   Patient Instructions  Bronchitis and sinus infection with residual cough. Continue azithromycin until finished. Will add rocephin 1 gram IM injection today.  For cough benzonatate for cough and flonase for nasal congestion.  I think your sedated feeling and fatigue was from phenergan.  Update me on Thursday afternoon. If still having significant sinus pressure or chest congestion may add on additional oral antibiotic.  For ED rx sildanefil 20 mg tab 2-5 1 hour prior to sex.  Follow up in 7-10 days or sooner if any signs/symptoms persist.    Mackie Pai, PA-C

## 2022-02-04 ENCOUNTER — Other Ambulatory Visit: Payer: Self-pay | Admitting: Medical
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
## 2022-03-31 ENCOUNTER — Other Ambulatory Visit (INDEPENDENT_AMBULATORY_CARE_PROVIDER_SITE_OTHER): Payer: Medicare HMO

## 2022-03-31 DIAGNOSIS — E559 Vitamin D deficiency, unspecified: Secondary | ICD-10-CM | POA: Diagnosis not present

## 2022-03-31 DIAGNOSIS — R739 Hyperglycemia, unspecified: Secondary | ICD-10-CM | POA: Diagnosis not present

## 2022-03-31 DIAGNOSIS — E785 Hyperlipidemia, unspecified: Secondary | ICD-10-CM | POA: Diagnosis not present

## 2022-03-31 LAB — COMPREHENSIVE METABOLIC PANEL
ALT: 16 U/L (ref 0–53)
AST: 13 U/L (ref 0–37)
Albumin: 4.2 g/dL (ref 3.5–5.2)
Alkaline Phosphatase: 56 U/L (ref 39–117)
BUN: 24 mg/dL — ABNORMAL HIGH (ref 6–23)
CO2: 25 mEq/L (ref 19–32)
Calcium: 9.1 mg/dL (ref 8.4–10.5)
Chloride: 104 mEq/L (ref 96–112)
Creatinine, Ser: 1.21 mg/dL (ref 0.40–1.50)
GFR: 62.71 mL/min (ref >60.00)
Glucose, Bld: 104 mg/dL — ABNORMAL HIGH (ref 70–99)
Potassium: 4 mEq/L (ref 3.5–5.1)
Sodium: 139 mEq/L (ref 135–145)
Total Bilirubin: 0.5 mg/dL (ref 0.2–1.2)
Total Protein: 6.8 g/dL (ref 6.0–8.3)

## 2022-03-31 LAB — LIPID PANEL
Cholesterol: 229 mg/dL — ABNORMAL HIGH (ref 0–200)
HDL: 42.2 mg/dL (ref >39.00)
LDL Cholesterol: 160 mg/dL — ABNORMAL HIGH (ref 0–99)
NonHDL: 187.14
Total CHOL/HDL Ratio: 5
Triglycerides: 135 mg/dL (ref 0.0–149.0)
VLDL: 27 mg/dL (ref 0.0–40.0)

## 2022-03-31 LAB — VITAMIN D 25 HYDROXY (VIT D DEFICIENCY, FRACTURES): VITD: 25.6 ng/mL — ABNORMAL LOW (ref 30.00–100.00)

## 2022-03-31 LAB — HEMOGLOBIN A1C: Hgb A1c MFr Bld: 5.9 % (ref 4.6–6.5)

## 2022-04-01 MED ORDER — VITAMIN D (ERGOCALCIFEROL) 1.25 MG (50000 UNIT) PO CAPS: 50000.0000 [IU] | ORAL_CAPSULE | ORAL | 0 refills | Status: DC

## 2022-04-01 NOTE — Addendum Note (Signed)
Addended by: Gwenevere Abbot on: 04/01/2022 06:24 AM   Modules accepted: Orders

## 2022-04-02 ENCOUNTER — Other Ambulatory Visit: Payer: Self-pay | Admitting: Internal Medicine

## 2022-04-02 ENCOUNTER — Other Ambulatory Visit: Payer: Self-pay | Admitting: Medical

## 2022-04-07 ENCOUNTER — Ambulatory Visit (INDEPENDENT_AMBULATORY_CARE_PROVIDER_SITE_OTHER): Payer: Medicare HMO | Admitting: Medical

## 2022-04-07 VITALS — BP 100/60 | HR 57 | Temp 98.0°F | Resp 18 | Ht 69.0 in | Wt 260.2 lb

## 2022-04-07 DIAGNOSIS — U071 COVID-19: Secondary | ICD-10-CM | POA: Diagnosis not present

## 2022-04-07 DIAGNOSIS — E559 Vitamin D deficiency, unspecified: Secondary | ICD-10-CM | POA: Diagnosis not present

## 2022-04-07 DIAGNOSIS — E785 Hyperlipidemia, unspecified: Secondary | ICD-10-CM

## 2022-04-07 DIAGNOSIS — J029 Acute pharyngitis, unspecified: Secondary | ICD-10-CM

## 2022-04-07 LAB — POC COVID19 BINAXNOW: SARS Coronavirus 2 Ag: POSITIVE — AB

## 2022-04-07 MED ORDER — EZETIMIBE 10 MG PO TABS: 10.0000 mg | ORAL_TABLET | Freq: Every day | ORAL | 3 refills | Status: DC

## 2022-04-07 MED ORDER — VITAMIN D (ERGOCALCIFEROL) 1.25 MG (50000 UNIT) PO CAPS: 50000.0000 [IU] | ORAL_CAPSULE | ORAL | 3 refills | Status: DC

## 2022-04-07 MED ORDER — BENZONATATE 100 MG PO CAPS
100.0000 mg | ORAL_CAPSULE | Freq: Three times a day (TID) | ORAL | 0 refills | Status: DC | PRN
Start: 2022-04-07 — End: 2023-11-03

## 2022-04-07 MED ORDER — MOLNUPIRAVIR EUA 200MG CAPSULE: 4.0000 | ORAL_CAPSULE | Freq: Two times a day (BID) | ORAL | 0 refills | Status: AC

## 2022-04-07 MED ORDER — FLUTICASONE PROPIONATE 50 MCG/ACT NA SUSP: 2.0000 | Freq: Every day | NASAL | 1 refills | Status: DC

## 2022-04-07 MED ORDER — CEPHALEXIN 500 MG PO CAPS: 500.0000 mg | ORAL_CAPSULE | Freq: Two times a day (BID) | ORAL | 0 refills | Status: DC

## 2022-04-07 NOTE — Patient Instructions (Addendum)
Covid infection. Second day of signs/symptoms.  Rx monlupiravir.(Discussed with pharmacy and flecanide/paxlovid contraindicated)  Flonase for nasal congestion, benzonatate for cough and making keflex available if secondary sinus infection or bronchitis.  Low vit d. Start ergocalciferol 50000 1 tab weekly. Vit otc 1000 iu every other day.  For high cholesterol start back on zetia and continue fenofibrate.  Check 02 sat daily if less than 96% let us know.  Follow up in 7-10 days or sooner if needed.

## 2022-04-07 NOTE — Progress Notes (Signed)
Subjective:    Patient ID: Richard Walker, male    DOB: 02-Dec-1955, 66 y.o.   MRN: [id number]  HPI  Pt in for lab follow up.  Pt has clear mucus, runny nose and mild st. He also has enlarged lymph nodes.  Symptoms for 2 days. Has mild cough.  Pt wonders if he has covid?  Subjective fever last night twice.  Mild chest congestion and sinus pressure.  Vit d deficiency- recent level was low. In past with vit D weekly version and 1000 iu every other day vit d level was normal in past.  High cholesterol- in past was on fenofibrate and zetia. Recent high cholesterol. Has not been on zetia recently.  Review of Systems  Constitutional:  Negative for chills and fatigue.  Respiratory:  Negative for cough, chest tightness, shortness of breath and wheezing.   Cardiovascular:  Negative for chest pain and palpitations.  Gastrointestinal:  Negative for abdominal pain.  Musculoskeletal:  Negative for back pain, joint swelling, neck pain and neck stiffness.  Skin:  Negative for rash.  Neurological:  Negative for dizziness, speech difficulty, light-headedness and headaches.  Hematological:  Negative for adenopathy. Does not bruise/bleed easily.  Psychiatric/Behavioral:  Negative for behavioral problems and confusion.     Past Medical History:  Diagnosis Date   DDD (degenerative disc disease), lumbar    L3/L4   Diverticulosis    GERD (gastroesophageal reflux disease)    Hyperlipidemia    Low sperm motility    Pre-diabetes    Sleep apnea    uses cpap    SVT (supraventricular tachycardia) (HCC)      Social History   Socioeconomic History   Marital status: Married    Spouse name: Not on file   Number of children: Not on file   Years of education: Not on file   Highest education level: Not on file  Occupational History   Not on file  Tobacco Use   Smoking status: Former    Packs/day: 1.00    Years: 10.00    Total pack years: 10.00    Types: Cigarettes    Quit date: 103     Years since quitting: 31.6   Smokeless tobacco: Never   Tobacco comments:    Uses Hookah Pipe occasionally  Vaping Use   Vaping Use: Never used  Substance and Sexual Activity   Alcohol use: No   Drug use: No   Sexual activity: Yes  Other Topics Concern   Not on file  Social History Narrative   Marital Status: Married Engineer, drilling)   Children:  4 (3 Sons/1 Daughter)   Pets:  None    Living Situation: Lives with spouse and 4 children   Origin:  He was born in [place].   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     Alcohol Use:  None   Drug Use:  None   Diet:  Regular   Exercise:  None   Hobbies:  Cards   Social Determinants of Health   Financial Resource Strain: High Risk (12/25/2021)   Overall Financial Resource Strain (CARDIA)    Difficulty of Paying Living Expenses: Very hard  Food Insecurity: Food Insecurity Present (12/25/2021)   Hunger Vital Sign    Worried About Running Out of Food in the Last Year: Often true    Ran Out of Food in the Last Year: Often true  Transportation  Needs: No Transportation Needs (12/23/2021)   PRAPARE - Hydrologist (Medical): No    Lack of Transportation (Non-Medical): No  Physical Activity: Inactive (12/23/2021)   Exercise Vital Sign    Days of Exercise per Week: 0 days    Minutes of Exercise per Session: 0 min  Stress: No Stress Concern Present (12/23/2021)   [place]    Feeling of Stress : Not at all  Social Connections: Not on file  Intimate Partner Violence: Not on file    Past Surgical History:  Procedure Laterality Date   AMPUTATION Left 07/15/2018   Procedure: . REPAIR OF LEFT INDEX AND LONG FINGER BY FUSION /  REPAIR OF RING FINGER;  Surgeon: Milly Jakob, MD;  Location: [facility];  Service: Orthopedics;   Laterality: Left;   BACK SURGERY     CARDIAC CATHETERIZATION N/A 12/22/2015   Procedure: Left Heart Cath and Coronary Angiography;  Surgeon: Peter M Martinique, MD;  Location: [facility];  Service: Cardiovascular;  Laterality: N/A;   CARDIAC ELECTROPHYSIOLOGY STUDY AND ABLATION     ELECTROPHYSIOLOGIC STUDY N/A 02/19/2016   Procedure: SVT Ablation;  Surgeon: Evans Lance, MD;  Location: [facility];  Service: Cardiovascular;  Laterality: N/A;   septal reconstruction  2017   TURBINATE REDUCTION Bilateral 07/2016   WISDOM TOOTH EXTRACTION      Family History  Problem Relation Age of Onset   Hypertension Father    Food Allergy Son    Colon cancer Neg Hx    Stomach cancer Neg Hx    Allergic rhinitis Neg Hx    Angioedema Neg Hx    Asthma Neg Hx    Eczema Neg Hx    Immunodeficiency Neg Hx    Urticaria Neg Hx     Allergies  Allergen Reactions   Dust Mite Extract    Pork-Derived Products Swelling   Statins     Muscle aches   Tree Extract Other (See Comments)    Intolerance     Current Outpatient Medications on File Prior to Visit  Medication Sig Dispense Refill   acetaminophen (TYLENOL) 325 MG tablet Take 2 tablets (650 mg total) by mouth every 6 (six) hours.     albuterol (PROAIR HFA) 108 (90 Base) MCG/ACT inhaler Inhale 1-2 puffs into the lungs every 6 (six) hours as needed for wheezing or shortness of breath. 24 g 3   azelastine (ASTELIN) 0.1 % nasal spray 1-2 sprays per nostril 2 times daily as needed. 90 mL 3   azithromycin (ZITHROMAX Z-PAK) 250 MG tablet As directed 6 each 0   benzonatate (TESSALON) 100 MG capsule Take 1 capsule (100 mg total) by mouth 3 (three) times daily as needed for cough. 30 capsule 0   budesonide-formoterol (SYMBICORT) 160-4.5 MCG/ACT inhaler Inhale 2 puffs into the lungs in the morning and at bedtime. 3 each 3   famotidine-calcium carbonate-magnesium hydroxide (PEPCID COMPLETE) 10-800-165 MG chewable tablet Chew by mouth.     fenofibrate  (TRICOR) 145 MG tablet Take 1 tablet (145 mg total) by mouth daily. 90 tablet 3   flecainide (TAMBOCOR) 150 MG tablet TAKE 1 TABLET(150 MG) BY MOUTH TWICE DAILY 180 tablet 3   fluticasone (FLONASE) 50 MCG/ACT nasal spray Place 1 spray into both nostrils daily. 48 g 3   fluticasone (FLONASE) 50 MCG/ACT nasal spray SHAKE LIQUID AND USE 2 SPRAYS IN EACH NOSTRIL DAILY 48 g 2   fluticasone (  FLONASE) 50 MCG/ACT nasal spray SHAKE LIQUID AND USE 2 SPRAYS IN EACH NOSTRIL DAILY 48 g 0   gabapentin (NEURONTIN) 100 MG capsule Take 1 capsule (100 mg total) by mouth 3 (three) times daily. 90 capsule 0   guaifenesin (HUMIBID E) 400 MG TABS tablet Take 1 tablet 3 times daily as needed for chest congestion and cough 21 tablet 0   ibuprofen (ADVIL) 600 MG tablet Take 1 tablet (600 mg total) by mouth every 8 (eight) hours as needed for up to 30 doses for fever, headache, mild pain or moderate pain (Inflammation). Take 1 tablet 3 times daily as needed for inflammation of upper airways and/or pain. 30 tablet 0   levocetirizine (XYZAL) 5 MG tablet TAKE 1 TABLET(5 MG) BY MOUTH EVERY EVENING 90 tablet 2   Magnesium 250 MG TABS Take 1 tablet by mouth once a week.     meloxicam (MOBIC) 15 MG tablet Take 1 tablet (15 mg total) by mouth daily. 30 tablet 1   metFORMIN (GLUCOPHAGE) 500 MG tablet TAKE 1 TABLET(500 MG) BY MOUTH TWICE DAILY WITH A MEAL 60 tablet 3   metoprolol tartrate (LOPRESSOR) 25 MG tablet Take one tablet by mouth twice a day.  May take one additional tablet by mouth as needed for breakthrough palpitations. 270 tablet 3   montelukast (SINGULAIR) 10 MG tablet TAKE 1 TABLET BY MOUTH 1 TIME AT NIGHT FOR COUGHING OR WHEEZING 90 tablet 3   pantoprazole (PROTONIX) 40 MG tablet TAKE 1 TABLET(40 MG) BY MOUTH TWICE DAILY 60 tablet 3   PRESCRIPTION MEDICATION Inhale into the lungs at bedtime. CPAP     promethazine-dextromethorphan (PROMETHAZINE-DM) 6.25-15 MG/5ML syrup Take 5 mLs by mouth 4 (four) times daily as needed  for cough. 180 mL 0   sildenafil (REVATIO) 20 MG tablet 2-5 tab one hour prior to sex. 40 tablet 0   Vitamin D, Ergocalciferol, (DRISDOL) 1.25 MG (50000 UNIT) CAPS capsule Take 1 capsule (50,000 Units total) by mouth every 7 (seven) days. 8 capsule 0   XARELTO 20 MG TABS tablet TAKE 1 TABLET(20 MG) BY MOUTH DAILY WITH SUPPER 90 tablet 1   No current facility-administered medications on file prior to visit.    BP 100/60   Pulse (!) 57   Temp 98 F (36.7 C)   Resp 18   Ht 5\' 9"  (1.753 m)   Wt 260 lb 3.2 oz (118 kg)   SpO2 96%   BMI 38.42 kg/m        Objective:   Physical Exam  General Mental Status- Alert. General Appearance- Not in acute distress.   Skin General: Color- Normal Color. Moisture- Normal Moisture.  Neck Carotid Arteries- Normal color. Moisture- Normal Moisture. No carotid bruits. No JVD.  Chest and Lung Exam Auscultation: Breath Sounds:-Normal.  Cardiovascular Auscultation:Rythm- Regular. Murmurs & Other Heart Sounds:Auscultation of the heart reveals- No Murmurs.  Abdomen Inspection:-Inspeection Normal. Palpation/Percussion:Note:No mass. Palpation and Percussion of the abdomen reveal- Non Tender, Non Distended + BS, no rebound or guarding.    Neurologic Cranial Nerve exam:- CN III-XII intact(No nystagmus), symmetric smile. Strength:- 5/5 equal and symmetric strength both upper and lower extremities.   Heent- nasal congestion but no sinus pressure on palpation.    Assessment & Plan:   Patient Instructions  Covid infection. Second day of signs/symptoms.  Rx monlupiravir.(Discussed with pharmacy and flecanide/paxlovid contraindicated)  Flonase for nasal congestion, benzonatate for cough and making keflex available if secondary sinus infection or bronchitis.  Low vit d. Start ergocalciferol 50000  1 tab weekly. Vit otc 1000 iu every other day.  For high cholesterol start back on zetia and continue fenofibrate.  Check 02 sat daily if less than  96% let us know.  Follow up in 7-10 days or sooner if needed.

## 2022-04-12 ENCOUNTER — Telehealth: Payer: Self-pay | Admitting: Medical

## 2022-04-12 NOTE — Telephone Encounter (Signed)
If pt is negative for covid how long should he wait to get the vaccine

## 2022-04-12 NOTE — Telephone Encounter (Signed)
Patient called to come in and be retested for covid. If he is negative, patient would also like to get pneumonia vaccine. Please call patient to advise. He wanted to get it done tomorrow if possible.

## 2022-04-13 NOTE — Telephone Encounter (Signed)
Pt is scheduled to come in for reswab and vaccine 8/30

## 2022-04-21 ENCOUNTER — Ambulatory Visit (INDEPENDENT_AMBULATORY_CARE_PROVIDER_SITE_OTHER): Payer: Medicare HMO | Admitting: Medical

## 2022-04-21 VITALS — BP 120/72 | HR 60 | Temp 98.6°F | Resp 18 | Ht 69.0 in | Wt 260.2 lb

## 2022-04-21 DIAGNOSIS — R5383 Other fatigue: Secondary | ICD-10-CM | POA: Diagnosis not present

## 2022-04-21 DIAGNOSIS — Z8616 Personal history of COVID-19: Secondary | ICD-10-CM | POA: Diagnosis not present

## 2022-04-21 LAB — CBC WITH DIFFERENTIAL/PLATELET
Basophils Absolute: 0 10*3/uL (ref 0.0–0.1)
Basophils Relative: 0.6 % (ref 0.0–3.0)
Eosinophils Absolute: 0.1 10*3/uL (ref 0.0–0.7)
Eosinophils Relative: 1.6 % (ref 0.0–5.0)
HCT: 46 % (ref 39.0–52.0)
Hemoglobin: 15.3 g/dL (ref 13.0–17.0)
Lymphocytes Relative: 24.7 % (ref 12.0–46.0)
Lymphs Abs: 1.6 10*3/uL (ref 0.7–4.0)
MCHC: 33.3 g/dL (ref 30.0–36.0)
MCV: 85.1 fl (ref 78.0–100.0)
Monocytes Absolute: 0.4 10*3/uL (ref 0.1–1.0)
Monocytes Relative: 6.7 % (ref 3.0–12.0)
Neutro Abs: 4.3 10*3/uL (ref 1.4–7.7)
Neutrophils Relative %: 66.4 % (ref 43.0–77.0)
Platelets: 198 10*3/uL (ref 150.0–400.0)
RBC: 5.4 Mil/uL (ref 4.22–5.81)
RDW: 13.8 % (ref 11.5–15.5)
WBC: 6.5 10*3/uL (ref 4.0–10.5)

## 2022-04-21 LAB — COMPREHENSIVE METABOLIC PANEL
ALT: 17 U/L (ref 0–53)
AST: 14 U/L (ref 0–37)
Albumin: 4.3 g/dL (ref 3.5–5.2)
Alkaline Phosphatase: 54 U/L (ref 39–117)
BUN: 17 mg/dL (ref 6–23)
CO2: 26 mEq/L (ref 19–32)
Calcium: 9.8 mg/dL (ref 8.4–10.5)
Chloride: 103 mEq/L (ref 96–112)
Creatinine, Ser: 1.06 mg/dL (ref 0.40–1.50)
GFR: 73.47 mL/min (ref >60.00)
Glucose, Bld: 90 mg/dL (ref 70–99)
Potassium: 4.3 mEq/L (ref 3.5–5.1)
Sodium: 138 mEq/L (ref 135–145)
Total Bilirubin: 0.6 mg/dL (ref 0.2–1.2)
Total Protein: 7.1 g/dL (ref 6.0–8.3)

## 2022-04-21 LAB — POC COVID19 BINAXNOW: SARS Coronavirus 2 Ag: NEGATIVE

## 2022-04-21 LAB — TSH: TSH: 0.95 u[IU]/mL (ref 0.35–5.50)

## 2022-04-21 LAB — T4, FREE: Free T4: 1.12 ng/dL (ref 0.60–1.60)

## 2022-04-21 LAB — VITAMIN B12: Vitamin B-12: 966 pg/mL — ABNORMAL HIGH (ref 211–911)

## 2022-04-21 NOTE — Patient Instructions (Addendum)
Covid dx 2 weeks ago. 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  Will also get cbc, cmp, tsh, t4 and b12 to look for other causes of fatigue.  Covid test came back negative.  Minimal intermittent dizziness occurring. Normal neurologic exam. If dizziness worsens let me know. Also faint intermittent left side headache. If either worsens would get ct head.   Follow up in 1 month or sooner if needed.

## 2022-04-21 NOTE — Progress Notes (Unsigned)
   Subjective:    Patient ID: Richard Walker, male    DOB: [birth year], 66 y.o.   MRN: [id number]  HPI Pt in for follow up.  Pt had covid on 04-07-2022.  He feels better overall but dose report still feeling fatigued. No sinus pressure or chest congestion. Some mild dizziness intermittent when lying on left side and changing positions.   Some mild left sided headache.   No reporting any chest pain   Pt states also he want to repeat covid test to make sure as he has family reunion coming up.  I had given molnupiravir for covid. Also he is on vit d and used benzonatate for cough. He stats also used keflex.       Review of Systems See hpi.    Objective:   Physical Exam  General Mental Status- Alert. General Appearance- Not in acute distress.   Skin General: Color- Normal Color. Moisture- Normal Moisture.  Neck Carotid Arteries- Normal color. Moisture- Normal Moisture. No carotid bruits. No JVD.  Chest and Lung Exam Auscultation: Breath Sounds:-Normal.  Cardiovascular Auscultation:Rythm- Regular. Murmurs & Other Heart Sounds:Auscultation of the heart reveals- No Murmurs.  Abdomen Inspection:-Inspeection Normal. Palpation/Percussion:Note:No mass. Palpation and Percussion of the abdomen reveal- Non Tender, Non Distended + BS, no rebound or guarding.   Neurologic Cranial Nerve exam:- CN III-XII intact(No nystagmus), symmetric smile. Strength:- 5/5 equal and symmetric strength both upper and lower extremities.   Heent- faint ethmoid sinus pressure. Otherwise not sinus pressure. Canals clear.nml tms. Normal posterior pharynx.      Assessment & Plan:   Patient Instructions  Covid dx 2 weeks ago. 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  Will also get cbc, cmp, tsh, t4 and b12 to look for other causes of fatigue.  Covid test came back negative.  Minimal intermittent dizziness occurring. Normal neurologic exam. If  dizziness worsens let me know. Also faint intermittent left side headache. If either worsens would get ct head.   Follow up in 1 month or sooner if needed.       Esperanza Richters, PA-C

## 2022-07-08 DIAGNOSIS — M1711 Unilateral primary osteoarthritis, right knee: Secondary | ICD-10-CM | POA: Diagnosis not present

## 2022-07-08 DIAGNOSIS — M1712 Unilateral primary osteoarthritis, left knee: Secondary | ICD-10-CM | POA: Diagnosis not present

## 2022-08-30 ENCOUNTER — Other Ambulatory Visit: Payer: Medicaid Other

## 2022-08-30 ENCOUNTER — Ambulatory Visit (INDEPENDENT_AMBULATORY_CARE_PROVIDER_SITE_OTHER): Payer: Medicaid Other | Admitting: Medical

## 2022-08-30 VITALS — BP 134/80 | HR 64 | Resp 18 | Ht 69.0 in | Wt 265.2 lb

## 2022-08-30 DIAGNOSIS — R5383 Other fatigue: Secondary | ICD-10-CM

## 2022-08-30 DIAGNOSIS — J029 Acute pharyngitis, unspecified: Secondary | ICD-10-CM

## 2022-08-30 DIAGNOSIS — E559 Vitamin D deficiency, unspecified: Secondary | ICD-10-CM | POA: Diagnosis not present

## 2022-08-30 DIAGNOSIS — E785 Hyperlipidemia, unspecified: Secondary | ICD-10-CM | POA: Diagnosis not present

## 2022-08-30 DIAGNOSIS — R739 Hyperglycemia, unspecified: Secondary | ICD-10-CM | POA: Diagnosis not present

## 2022-08-30 DIAGNOSIS — Z125 Encounter for screening for malignant neoplasm of prostate: Secondary | ICD-10-CM | POA: Diagnosis not present

## 2022-08-30 DIAGNOSIS — Z20822 Contact with and (suspected) exposure to covid-19: Secondary | ICD-10-CM

## 2022-08-30 LAB — POCT RAPID STREP A (OFFICE): Rapid Strep A Screen: NEGATIVE

## 2022-08-30 LAB — POC COVID19 BINAXNOW: SARS Coronavirus 2 Ag: NEGATIVE

## 2022-08-30 LAB — POCT INFLUENZA A/B
Influenza A, POC: NEGATIVE
Influenza B, POC: NEGATIVE

## 2022-08-30 MED ORDER — RIVAROXABAN 20 MG PO TABS: ORAL_TABLET | ORAL | 1 refills | Status: DC

## 2022-08-30 MED ORDER — PANTOPRAZOLE SODIUM 40 MG PO TBEC: 40.0000 mg | DELAYED_RELEASE_TABLET | Freq: Every day | ORAL | 11 refills | Status: DC

## 2022-08-30 MED ORDER — METFORMIN HCL 500 MG PO TABS: ORAL_TABLET | ORAL | 11 refills | Status: DC

## 2022-08-30 NOTE — Patient Instructions (Addendum)
Viral syndrome recent with mild st. Signs/symptoms have resolved. Flu test, covid and strep all negative.  Low vit D- placing future vit D level today.  High cholesterol- placing future lipid panel and cmp today.  For atrial fibrillation continue meds rx'd by your cardiologist. On review you have adequate xarelto.  Future screening psa placed.  Advised not to use nsaids since on xarelto(since on review you noted ortho rx'd ibuprofen).  Follow up date to be determined after lab review.

## 2022-08-30 NOTE — Progress Notes (Signed)
Subjective:    Patient ID: Richard Walker, male    DOB: [birth year], 67 y.o.   MRN: [id number]  HPI Pt in for follow up.  Pt states late last week when in [place] he started to feel bad. Pt wife recently had covid. He felt very fatigue for about 3 weeks. No fever, chills or body aches. He mild st but no cough. Also had a lot of flatulence. His symptoms are much improved. He had some slight mucus from his nose with occasional tinge of blood.   Pt did not test for covid yet. We tested today. Pt states he is feeling better compared to last week.   He needs refill of metformin.  Pt last labs show a1-c was 5.9.   Pt seeing orthopedist for knee pain. He states knee pain is worse recently and he has seen new orthopedist. Pt was given ibuprofen.    Pt is on xarelto. Hx of atrial fibrillation. Initially he states was out then he states has plenty of meds.   Hx of low vit D despite high dose supplamentation.    Review of Systems  Constitutional:  Negative for chills, fatigue and fever.  HENT:  Negative for dental problem.   Respiratory:  Negative for chest tightness, shortness of breath and wheezing.   Cardiovascular:  Negative for chest pain and palpitations.  Gastrointestinal:  Negative for abdominal pain, anal bleeding, constipation, diarrhea and rectal pain.  Genitourinary:  Negative for dysuria, flank pain and frequency.  Musculoskeletal:  Negative for back pain, joint swelling and myalgias.  Skin:  Negative for rash.  Neurological:  Negative for seizures, facial asymmetry and numbness.  Hematological:  Negative for adenopathy. Does not bruise/bleed easily.  Psychiatric/Behavioral:  Negative for behavioral problems and decreased concentration.     Past Medical History:  Diagnosis Date   DDD (degenerative disc disease), lumbar    L3/L4   Diverticulosis    GERD (gastroesophageal reflux disease)    Hyperlipidemia    Low sperm motility    Pre-diabetes    Sleep apnea    uses  cpap    SVT (supraventricular tachycardia) (HCC)      Social History   Socioeconomic History   Marital status: Married    Spouse name: Not on file   Number of children: Not on file   Years of education: Not on file   Highest education level: Not on file  Occupational History   Not on file  Tobacco Use   Smoking status: Former    Packs/day: 1.00    Years: 10.00    Total pack years: 10.00    Types: Cigarettes    Quit date: 77    Years since quitting: 32.0   Smokeless tobacco: Never   Tobacco comments:    Uses Hookah Pipe occasionally  Vaping Use   Vaping Use: Never used  Substance and Sexual Activity   Alcohol use: No   Drug use: No   Sexual activity: Yes  Other Topics Concern   Not on file  Social History Narrative   Marital Status: Married Engineer, drilling)   Children:  4 (3 Sons/1 Daughter)   Pets:  None    Living Situation: Lives with spouse and 4 children   Origin:  He was born in [place].   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     Alcohol Use:  None   Drug Use:  None   Diet:  Regular   Exercise:  None   Hobbies:  Cards   Social Determinants of Health   Financial Resource Strain: High Risk (12/25/2021)   Overall Financial Resource Strain (CARDIA)    Difficulty of Paying Living Expenses: Very hard  Food Insecurity: Food Insecurity Present (12/25/2021)   Hunger Vital Sign    Worried About Running Out of Food in the Last Year: Often true    Ran Out of Food in the Last Year: Often true  Transportation Needs: No Transportation Needs (12/23/2021)   PRAPARE - Hydrologist (Medical): No    Lack of Transportation (Non-Medical): No  Physical Activity: Inactive (12/23/2021)   Exercise Vital Sign    Days of Exercise per Week: 0 days    Minutes of Exercise per Session: 0 min  Stress: No Stress Concern Present  (12/23/2021)   [place]    Feeling of Stress : Not at all  Social Connections: Not on file  Intimate Partner Violence: Not on file    Past Surgical History:  Procedure Laterality Date   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 REPAIR OF LEFT INDEX AND LONG FINGER BY FUSION /  REPAIR OF RING FINGER;  Surgeon: Milly Jakob, MD;  Location: [facility];  Service: Orthopedics;  Laterality: Left;   BACK SURGERY     CARDIAC CATHETERIZATION N/A 12/22/2015   Procedure: Left Heart Cath and Coronary Angiography;  Surgeon: Peter M Martinique, MD;  Location: [facility];  Service: Cardiovascular;  Laterality: N/A;   CARDIAC ELECTROPHYSIOLOGY STUDY AND ABLATION     ELECTROPHYSIOLOGIC STUDY N/A 02/19/2016   Procedure: SVT Ablation;  Surgeon: Evans Lance, MD;  Location: [facility];  Service: Cardiovascular;  Laterality: N/A;   septal reconstruction  2017   TURBINATE REDUCTION Bilateral 07/2016   WISDOM TOOTH EXTRACTION      Family History  Problem Relation Age of Onset   Hypertension Father    Food Allergy Son    Colon cancer Neg Hx    Stomach cancer Neg Hx    Allergic rhinitis Neg Hx    Angioedema Neg Hx    Asthma Neg Hx    Eczema Neg Hx    Immunodeficiency Neg Hx    Urticaria Neg Hx     Allergies  Allergen Reactions   Dust Mite Extract    Pork-Derived Products Swelling   Statins     Muscle aches   Tree Extract Other (See Comments)    Intolerance     Current Outpatient Medications on File Prior to Visit  Medication Sig Dispense Refill   acetaminophen (TYLENOL) 325 MG tablet Take 2 tablets (650 mg total) by mouth every 6 (six) hours.     albuterol (PROAIR HFA) 108 (90 Base) MCG/ACT inhaler Inhale 1-2 puffs into the lungs every 6 (six) hours as needed for wheezing or shortness of breath. 24 g 3   azelastine (ASTELIN) 0.1 % nasal spray 1-2 sprays per nostril 2 times daily as needed. 90 mL 3   azithromycin  (ZITHROMAX Z-PAK) 250 MG tablet As directed 6 each 0   benzonatate (TESSALON) 100 MG capsule Take 1 capsule (100 mg total) by mouth 3 (three) times daily as needed for cough. 30 capsule 0   budesonide-formoterol (SYMBICORT) 160-4.5 MCG/ACT inhaler Inhale 2 puffs into the lungs in the morning and at bedtime. 3 each 3   cephALEXin (KEFLEX) 500 MG  capsule Take 1 capsule (500 mg total) by mouth 2 (two) times daily. 20 capsule 0   ezetimibe (ZETIA) 10 MG tablet Take 1 tablet (10 mg total) by mouth daily. 90 tablet 3   famotidine-calcium carbonate-magnesium hydroxide (PEPCID COMPLETE) 10-800-165 MG chewable tablet Chew by mouth.     flecainide (TAMBOCOR) 150 MG tablet TAKE 1 TABLET(150 MG) BY MOUTH TWICE DAILY 180 tablet 3   fluticasone (FLONASE) 50 MCG/ACT nasal spray Place 2 sprays into both nostrils daily. 16 g 1   gabapentin (NEURONTIN) 100 MG capsule Take 1 capsule (100 mg total) by mouth 3 (three) times daily. 90 capsule 0   guaifenesin (HUMIBID E) 400 MG TABS tablet Take 1 tablet 3 times daily as needed for chest congestion and cough 21 tablet 0   levocetirizine (XYZAL) 5 MG tablet TAKE 1 TABLET(5 MG) BY MOUTH EVERY EVENING 90 tablet 2   Magnesium 250 MG TABS Take 1 tablet by mouth once a week.     metoprolol tartrate (LOPRESSOR) 25 MG tablet Take one tablet by mouth twice a day.  May take one additional tablet by mouth as needed for breakthrough palpitations. 270 tablet 3   montelukast (SINGULAIR) 10 MG tablet TAKE 1 TABLET BY MOUTH 1 TIME AT NIGHT FOR COUGHING OR WHEEZING 90 tablet 3   PRESCRIPTION MEDICATION Inhale into the lungs at bedtime. CPAP     promethazine-dextromethorphan (PROMETHAZINE-DM) 6.25-15 MG/5ML syrup Take 5 mLs by mouth 4 (four) times daily as needed for cough. 180 mL 0   sildenafil (REVATIO) 20 MG tablet 2-5 tab one hour prior to sex. 40 tablet 0   Vitamin D, Ergocalciferol, (DRISDOL) 1.25 MG (50000 UNIT) CAPS capsule Take 1 capsule (50,000 Units total) by mouth every 7  (seven) days. 12 capsule 3   fenofibrate (TRICOR) 145 MG tablet Take 1 tablet (145 mg total) by mouth daily. 90 tablet 3   fluticasone (FLONASE) 50 MCG/ACT nasal spray Place 1 spray into both nostrils daily. 48 g 3   fluticasone (FLONASE) 50 MCG/ACT nasal spray SHAKE LIQUID AND USE 2 SPRAYS IN EACH NOSTRIL DAILY 48 g 2   fluticasone (FLONASE) 50 MCG/ACT nasal spray SHAKE LIQUID AND USE 2 SPRAYS IN EACH NOSTRIL DAILY 48 g 0   Vitamin D, Ergocalciferol, (DRISDOL) 1.25 MG (50000 UNIT) CAPS capsule Take 1 capsule (50,000 Units total) by mouth every 7 (seven) days. 8 capsule 0   No current facility-administered medications on file prior to visit.    BP 134/80   Pulse 64   Resp 18   Ht 5\' 9"  (1.753 m)   Wt 265 lb 3.2 oz (120.3 kg)   SpO2 98%   BMI 39.16 kg/m        Objective:   Physical Exam  General Mental Status- Alert. General Appearance- Not in acute distress.   Skin General: Color- Normal Color. Moisture- Normal Moisture.  Neck Carotid Arteries- Normal color. Moisture- Normal Moisture. No carotid bruits. No JVD.  Chest and Lung Exam Auscultation: Breath Sounds:-Normal.  Cardiovascular Auscultation:Rythm- Regular. Murmurs & Other Heart Sounds:Auscultation of the heart reveals- No Murmurs.  Abdomen Inspection:-Inspeection Normal. Palpation/Percussion:Note:No mass. Palpation and Percussion of the abdomen reveal- Non Tender, Non Distended + BS, no rebound or guarding.   Neurologic Cranial Nerve exam:- CN III-XII intact(No nystagmus), symmetric smile. Strength:- 5/5 equal and symmetric strength both upper and lower extremities.       Assessment & Plan:   Patient Instructions  Viral syndrome recent with mild st. Signs/symptoms have resolved. Flu test,  covid and strep all negative.  Low vit D- placing future vit D level today.  High cholesterol- placing future lipid panel and cmp today.  For atrial fibrillation continue meds rx'd by your cardiologist. On review  you have adequate xarelto.  Future screening psa placed.  Advised not to use nsaids since on xarelto(since on review you noted ortho rx'd ibuprofen).  Follow up date to be determined after lab review.        Richard Dredge Cortana Vanderford PA-C  Time spent with patient today was  40 minutes which consisted of chart review, discussing diagnosis, work up treatment and documentation.

## 2022-08-31 ENCOUNTER — Other Ambulatory Visit (INDEPENDENT_AMBULATORY_CARE_PROVIDER_SITE_OTHER): Payer: Medicare HMO

## 2022-08-31 DIAGNOSIS — E785 Hyperlipidemia, unspecified: Secondary | ICD-10-CM | POA: Diagnosis not present

## 2022-08-31 DIAGNOSIS — Z125 Encounter for screening for malignant neoplasm of prostate: Secondary | ICD-10-CM

## 2022-08-31 DIAGNOSIS — R739 Hyperglycemia, unspecified: Secondary | ICD-10-CM

## 2022-08-31 DIAGNOSIS — E559 Vitamin D deficiency, unspecified: Secondary | ICD-10-CM | POA: Diagnosis not present

## 2022-08-31 LAB — COMPREHENSIVE METABOLIC PANEL
ALT: 21 U/L (ref 0–53)
AST: 15 U/L (ref 0–37)
Albumin: 4.3 g/dL (ref 3.5–5.2)
Alkaline Phosphatase: 50 U/L (ref 39–117)
BUN: 20 mg/dL (ref 6–23)
CO2: 28 mEq/L (ref 19–32)
Calcium: 9.5 mg/dL (ref 8.4–10.5)
Chloride: 103 mEq/L (ref 96–112)
Creatinine, Ser: 1.19 mg/dL (ref 0.40–1.50)
GFR: 63.79 mL/min (ref >60.00)
Glucose, Bld: 105 mg/dL — ABNORMAL HIGH (ref 70–99)
Potassium: 4.2 mEq/L (ref 3.5–5.1)
Sodium: 140 mEq/L (ref 135–145)
Total Bilirubin: 0.5 mg/dL (ref 0.2–1.2)
Total Protein: 6.8 g/dL (ref 6.0–8.3)

## 2022-08-31 LAB — HEMOGLOBIN A1C: Hgb A1c MFr Bld: 5.8 % (ref 4.6–6.5)

## 2022-08-31 LAB — LIPID PANEL
Cholesterol: 221 mg/dL — ABNORMAL HIGH (ref 0–200)
HDL: 42.2 mg/dL (ref >39.00)
LDL Cholesterol: 144 mg/dL — ABNORMAL HIGH (ref 0–99)
NonHDL: 178.89
Total CHOL/HDL Ratio: 5
Triglycerides: 173 mg/dL — ABNORMAL HIGH (ref 0.0–149.0)
VLDL: 34.6 mg/dL (ref 0.0–40.0)

## 2022-08-31 LAB — VITAMIN D 25 HYDROXY (VIT D DEFICIENCY, FRACTURES): VITD: 28.99 ng/mL — ABNORMAL LOW (ref 30.00–100.00)

## 2022-08-31 LAB — PSA: PSA: 2.04 ng/mL (ref 0.10–4.00)

## 2022-09-02 ENCOUNTER — Encounter: Payer: Self-pay | Admitting: Medical

## 2022-09-10 ENCOUNTER — Encounter: Payer: Self-pay | Admitting: Internal Medicine

## 2022-09-13 ENCOUNTER — Ambulatory Visit (INDEPENDENT_AMBULATORY_CARE_PROVIDER_SITE_OTHER): Payer: Medicare HMO | Admitting: Medical

## 2022-09-13 VITALS — BP 134/80 | HR 58 | Temp 97.8°F | Resp 18 | Ht 69.0 in | Wt 265.0 lb

## 2022-09-13 DIAGNOSIS — Z23 Encounter for immunization: Secondary | ICD-10-CM | POA: Diagnosis not present

## 2022-09-13 DIAGNOSIS — B029 Zoster without complications: Secondary | ICD-10-CM

## 2022-09-13 MED ORDER — FAMCICLOVIR 500 MG PO TABS: 500.0000 mg | ORAL_TABLET | Freq: Three times a day (TID) | ORAL | 0 refills | Status: DC

## 2022-09-13 NOTE — Progress Notes (Signed)
   Subjective:    Patient ID: Richard Walker, male    DOB: 08-17-1956, 67 y.o.   MRN: [id number]  HPI  Pt state he has some mild left ear pressure on Thursday. Mild nasal congestion around that time. He uses some nasal saline and it seemed to help ear pain. No current ear pain.   Pt states at times will have dry throat in the morning.  Also left side of scalp pain that ran down to his left ear. Pain was in scalp. But patient felt pain may been sharp and burning pain. No nausea, no vomiting or vision changes. No motor or sensory function deficits.  Pt has had his shingles vaccine.  Pt getting flu vaccine today.     Review of Systems  Constitutional:  Negative for chills, fatigue and fever.       Objective:   Physical Exam   General Mental Status- Alert. General Appearance- Not in acute distress.   Skin Left side scalp. Small barely break in his scalp 1 mm. Faint tender in that area and below toward his left ear.  Heent- normal no sinus pressure. Canals clear and normal tms   Neck Carotid Arteries- Normal color. Moisture- Normal Moisture. No carotid bruits. No JVD.  Chest and Lung Exam Auscultation: Breath Sounds:-Normal.  Cardiovascular Auscultation:Rythm- Regular. Murmurs & Other Heart Sounds:Auscultation of the heart reveals- No Murmurs.  Abdomen Inspection:-Inspeection Normal. Palpation/Percussion:Note:No mass. Palpation and Percussion of the abdomen reveal- Non Tender, Non Distended + BS, no rebound or guarding.   Neurologic Cranial Nerve exam:- CN III-XII intact(No nystagmus), symmetric smile. Drift Test:- No drift. Romberg Exam:- Negative.  Strength:- 5/5 equal and symmetric strength both upper and lower extremities.      Assessment & Plan:   Patient Instructions  I think early atypical shingles based on describe sharp pain in scalp and small minimal tender area that may have been ruptured vesicle. The shingles vaccine that you have received may have  decreased severity of out break. Rx famvir antiviral for 10 days.  If severe pain returns left me know. If described as deep and ha would get ct head.  Ear exam normal today.  Pcv 20 vaccine and flu vaccine.  Will refer to optometrist for intermittent blurred vision for years.  Follow up 2 -3 weeks or sooner if needed   [employer], PA-C

## 2022-09-13 NOTE — Addendum Note (Signed)
Addended by: Jeronimo Greaves on: 09/13/2022 02:37 PM   Modules accepted: Orders

## 2022-09-13 NOTE — Patient Instructions (Addendum)
I think early atypical shingles based on describe sharp pain in scalp and small minimal tender area that may have been ruptured vesicle. The shingles vaccine that you have received may have decreased severity of out break. Rx famvir antiviral for 10 days.  If severe pain returns left me know. If described as deep and ha would get ct head.  Ear exam normal today.  Pcv 20 vaccine and flu vaccine.  Will refer to optometrist for intermittent blurred vision for years.  Follow up 2 -3 weeks or sooner if needed

## 2022-09-27 ENCOUNTER — Other Ambulatory Visit: Payer: Self-pay | Admitting: Medical

## 2022-09-27 MED ORDER — FENOFIBRATE 145 MG PO TABS: 145.0000 mg | ORAL_TABLET | Freq: Every day | ORAL | 3 refills | Status: DC

## 2022-10-04 ENCOUNTER — Other Ambulatory Visit (HOSPITAL_COMMUNITY): Payer: Self-pay

## 2022-10-04 ENCOUNTER — Ambulatory Visit (INDEPENDENT_AMBULATORY_CARE_PROVIDER_SITE_OTHER): Payer: Medicare HMO

## 2022-10-04 ENCOUNTER — Ambulatory Visit (INDEPENDENT_AMBULATORY_CARE_PROVIDER_SITE_OTHER): Payer: Medicare HMO | Admitting: Pulmonary Disease

## 2022-10-04 ENCOUNTER — Encounter (HOSPITAL_BASED_OUTPATIENT_CLINIC_OR_DEPARTMENT_OTHER): Payer: Self-pay | Admitting: Pulmonary Disease

## 2022-10-04 VITALS — BP 128/82 | HR 68 | Temp 98.7°F | Ht 69.0 in | Wt 265.8 lb

## 2022-10-04 DIAGNOSIS — R0609 Other forms of dyspnea: Secondary | ICD-10-CM

## 2022-10-04 DIAGNOSIS — R0600 Dyspnea, unspecified: Secondary | ICD-10-CM | POA: Diagnosis not present

## 2022-10-04 DIAGNOSIS — J4550 Severe persistent asthma, uncomplicated: Secondary | ICD-10-CM

## 2022-10-04 DIAGNOSIS — J418 Mixed simple and mucopurulent chronic bronchitis: Secondary | ICD-10-CM

## 2022-10-04 DIAGNOSIS — J9811 Atelectasis: Secondary | ICD-10-CM | POA: Diagnosis not present

## 2022-10-04 MED ORDER — BREZTRI AEROSPHERE 160-9-4.8 MCG/ACT IN AERO: 2.0000 | INHALATION_SPRAY | Freq: Two times a day (BID) | RESPIRATORY_TRACT | 0 refills | Status: DC

## 2022-10-04 MED ORDER — BREZTRI AEROSPHERE 160-9-4.8 MCG/ACT IN AERO: 2.0000 | INHALATION_SPRAY | Freq: Two times a day (BID) | RESPIRATORY_TRACT | 5 refills | Status: DC

## 2022-10-04 NOTE — Patient Instructions (Signed)
Breztri two puffs in the morning and two puffs in the evening, and rinse your mouth after each use.  Stop using symbicort once you start using breztri.  Albuterol two puffs every 6 hours as needed for cough, wheeze, chest congestion, or shortness of breath.  Chest xray today.  Will arrange for pulmonary function test.  Follow up in 6 to 8 weeks.

## 2022-10-04 NOTE — Progress Notes (Signed)
[facility]  Chief Complaint  Patient presents with   Follow-up    Breathing gotten worse    Constitutional:  BP 128/82 (BP Location: Right Arm, Cuff Size: Normal)   Pulse 68   Temp 98.7 F (37.1 C) (Oral)   Ht 5' 9"$  (1.753 m)   Wt 265 lb 12.8 oz (120.6 kg)   SpO2 97%   BMI 39.25 kg/m   Past Medical History:  SVT, Atrial fibrillation/flutter, OSA  Past Surgical History:  His  has a past surgical history that includes Back surgery; Wisdom tooth extraction; Cardiac catheterization (N/A, 12/22/2015); Cardiac catheterization (N/A, 02/19/2016); Cardiac electrophysiology study and ablation; septal reconstruction (2017); Turbinate reduction (Bilateral, 07/2016); and Amputation (Left, 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  Brief Summary:  Richard Walker is a 67 y.o. male former smoker with dyspnea and cough.       Subjective:   He has more trouble with his knee arthritis.  He hasn't been able to exercise.  Weight has gone up.  He is getting more short of breath with activity.  He has cough with chest congestion.  Has been using symbicort.  Needs to use albuterol frequently.  Uses CPAP every night.  Physical Exam:   Appearance - well kempt   ENMT - no sinus tenderness, no oral exudate, no LAN, Mallampati 3 airway, no stridor, TM clear b/l  Respiratory - equal breath sounds bilaterally, no wheezing or rales  CV - s1s2 regular rate and rhythm, no murmurs  Ext - no clubbing, no edema  Skin - no rashes  Psych - normal mood and affect     Pulmonary testing:  PFT 10/31/17 >> FEV1 2.29 64%), FEV1% 83, TLC 5.59 (79%), DLCO 77% IgE 05/28/20 >> 151 PFT 06/24/20 >> FEV1 2.86 (82%), FEV1% 83, TLC 3.98 (56%), DLCO 99%, +BD  Chest Imaging:  HRCT chest 10/21/17 >> atherosclerosis, scarring RML and lingula, hepatic steatosis HRCT chest 07/16/20 >> aortic atherosclerosis, calcified atherosclerotic plaque of Lt main/LAD/LCX/RCA, small thin walled cyst in lateral segment  of RML, scarring inferior segment of lingula, fatty liver, left renal cyst  Sleep Tests:  PSG 12/29/15 >> AHI 69.8, SpO2 low 89% ONO with CPAP 05/31/18 >> test time 8 hrs 54 min.  Baseline SpO2 95.9%, low SpO2 93%. HST 03/18/21 >> AHI 50.9, SpO2 low 88%  Cardiac Tests:  LHC 12/22/15 >> mild, non obstructive CAD, normal LV fx CPST 05/24/18 >> submaximal effort, mild chronotropic incompetence CPST 09/02/20 >> submaximal effort, deconditioning, mild restriction from body habitus, no clear cardiopulmonary limitation Echo 10/29/21 >> EF 60 to 65%, grade 2 DD, mild MR, aortic root 41 mm  Social History:  He  reports that he quit smoking about 32 years ago. His smoking use included cigarettes. He has a 10.00 pack-year smoking history. He has never used smokeless tobacco. He reports that he does not drink alcohol and does not use drugs.  Family History:  His family history includes Food Allergy in his son; Hypertension in his father.     Assessment/Plan:   Chronic bronchitis. Moderate persistent asthma with allergic profile. Perennial and seasonal allergic rhinitis. - previously seen by Dr. Verlin Fester with Asthma/Allergy (last in March 2019) - reaction to grass pollen, tree pollen, cockroach antigen, and dust mite antigen - has elevated IgE - will change from symbicort to breztri; sample provided - continue azelastine, fluticasone nasal spray, montelukast and xyzal -  f/u CXR, PFT - might need to consider a biologic agent  Allergic conjunctivitis. -  olopatadine was too expensive  Restrictive lung disease. - related to obesity and body habitus - check PFT  Obstructive sleep apnea. - managed by Dr. Brett Fairy with [facility]  Atrial fibrillation/flutter. - followed by Dr. Cristopher Peru with [facility]  Time Spent Involved in Patient Care on Day of Examination:  36 minutes  Follow up:   Patient Instructions  Breztri two puffs in the morning and two puffs in the evening,  and rinse your mouth after each use.  Stop using symbicort once you start using breztri.  Albuterol two puffs every 6 hours as needed for cough, wheeze, chest congestion, or shortness of breath.  Chest xray today.  Will arrange for pulmonary function test.  Follow up in 6 to 8 weeks.  Medication List:   Allergies as of 10/04/2022       Reactions   Dust Mite Extract    Pork-derived Products Swelling   Statins    Muscle aches   Tree Extract Other (See Comments)   Intolerance        Medication List        Accurate as of October 04, 2022  9:27 AM. If you have any questions, ask your nurse or doctor.          STOP taking these medications    azithromycin 250 MG tablet Commonly known as: Zithromax Z-Pak Stopped by: Chesley Mires, MD   budesonide-formoterol 160-4.5 MCG/ACT inhaler Commonly known as: Symbicort Stopped by: Chesley Mires, MD   cephALEXin 500 MG capsule Commonly known as: KEFLEX Stopped by: Chesley Mires, MD   promethazine-dextromethorphan 6.25-15 MG/5ML syrup Commonly known as: PROMETHAZINE-DM Stopped by: Chesley Mires, MD       TAKE these medications    acetaminophen 325 MG tablet Commonly known as: Tylenol Take 2 tablets (650 mg total) by mouth every 6 (six) hours.   albuterol 108 (90 Base) MCG/ACT inhaler Commonly known as: ProAir HFA Inhale 1-2 puffs into the lungs every 6 (six) hours as needed for wheezing or shortness of breath.   azelastine 0.1 % nasal spray Commonly known as: ASTELIN 1-2 sprays per nostril 2 times daily as needed.   benzonatate 100 MG capsule Commonly known as: TESSALON Take 1 capsule (100 mg total) by mouth 3 (three) times daily as needed for cough.   Breztri Aerosphere 160-9-4.8 MCG/ACT Aero Generic drug: Budeson-Glycopyrrol-Formoterol Inhale 2 puffs into the lungs in the morning and at bedtime. Started by: Chesley Mires, MD   ezetimibe 10 MG tablet Commonly known as: Zetia Take 1 tablet (10 mg total) by mouth  daily.   famciclovir 500 MG tablet Commonly known as: FAMVIR Take 1 tablet (500 mg total) by mouth 3 (three) times daily.   famotidine-calcium carbonate-magnesium hydroxide 10-800-165 MG chewable tablet Commonly known as: PEPCID COMPLETE Chew by mouth.   fenofibrate 145 MG tablet Commonly known as: TRICOR Take 1 tablet (145 mg total) by mouth daily.   flecainide 150 MG tablet Commonly known as: TAMBOCOR TAKE 1 TABLET(150 MG) BY MOUTH TWICE DAILY   fluticasone 50 MCG/ACT nasal spray Commonly known as: FLONASE Place 2 sprays into both nostrils daily. What changed: Another medication with the same name was removed. Continue taking this medication, and follow the directions you see here. Changed by: Chesley Mires, MD   gabapentin 100 MG capsule Commonly known as: NEURONTIN Take 1 capsule (100 mg total) by mouth 3 (three) times daily.   guaifenesin 400 MG Tabs tablet Commonly known as: HUMIBID E Take 1 tablet 3  times daily as needed for chest congestion and cough   levocetirizine 5 MG tablet Commonly known as: XYZAL TAKE 1 TABLET(5 MG) BY MOUTH EVERY EVENING   Magnesium 250 MG Tabs Take 1 tablet by mouth once a week.   metFORMIN 500 MG tablet Commonly known as: GLUCOPHAGE TAKE 1 TABLET(500 MG) BY MOUTH TWICE DAILY WITH A MEAL   metoprolol tartrate 25 MG tablet Commonly known as: LOPRESSOR Take one tablet by mouth twice a day.  May take one additional tablet by mouth as needed for breakthrough palpitations.   montelukast 10 MG tablet Commonly known as: SINGULAIR TAKE 1 TABLET BY MOUTH 1 TIME AT NIGHT FOR COUGHING OR WHEEZING   pantoprazole 40 MG tablet Commonly known as: PROTONIX Take 1 tablet (40 mg total) by mouth daily.   PRESCRIPTION MEDICATION Inhale into the lungs at bedtime. CPAP   rivaroxaban 20 MG Tabs tablet Commonly known as: Xarelto TAKE 1 TABLET(20 MG) BY MOUTH DAILY WITH SUPPER   sildenafil 20 MG tablet Commonly known as: REVATIO 2-5 tab one hour  prior to sex.   Vitamin D (Ergocalciferol) 1.25 MG (50000 UNIT) Caps capsule Commonly known as: DRISDOL Take 1 capsule (50,000 Units total) by mouth every 7 (seven) days. What changed: Another medication with the same name was removed. Continue taking this medication, and follow the directions you see here. Changed by: Chesley Mires, MD        Signature:  Chesley Mires, MD [place] Pager - [phone] 10/04/2022, 9:27 AM

## 2022-10-07 ENCOUNTER — Encounter (HOSPITAL_BASED_OUTPATIENT_CLINIC_OR_DEPARTMENT_OTHER): Payer: Self-pay | Admitting: Pulmonary Disease

## 2022-10-07 NOTE — Progress Notes (Signed)
PCP:  Mackie Pai, PA-C Primary Cardiologist: None Electrophysiologist: Cristopher Peru, MD   Richard Walker is a 67 y.o. male seen today for Cristopher Peru, MD for routine electrophysiology followup. Since last being seen in our clinic the patient reports doing well overall. He still has some SOB with inclines, but is not very active given his knee pain. he denies chest pain, palpitations, PND, orthopnea, nausea, vomiting, dizziness, syncope, edema, weight gain, or early satiety.   He uses 600 mg ibuprofen BID for knee pain. Occasionally wakes up with dry through/nose and dry blood. No frank epistaxis.  Past Medical History:  Diagnosis Date   DDD (degenerative disc disease), lumbar    L3/L4   Diverticulosis    GERD (gastroesophageal reflux disease)    Hyperlipidemia    Low sperm motility    Pre-diabetes    Sleep apnea    uses cpap    SVT (supraventricular tachycardia)     Current Outpatient Medications  Medication Instructions   acetaminophen (TYLENOL) 650 mg, Oral, Every 6 hours   albuterol (PROAIR HFA) 108 (90 Base) MCG/ACT inhaler 1-2 puffs, Inhalation, Every 6 hours PRN   azelastine (ASTELIN) 0.1 % nasal spray 1-2 sprays per nostril 2 times daily as needed.   benzonatate (TESSALON) 100 mg, Oral, 3 times daily PRN   Budeson-Glycopyrrol-Formoterol (BREZTRI AEROSPHERE) 160-9-4.8 MCG/ACT AERO 2 puffs, Inhalation, 2 times daily   Budeson-Glycopyrrol-Formoterol (BREZTRI AEROSPHERE) 160-9-4.8 MCG/ACT AERO 2 puffs, Inhalation, 2 times daily   ezetimibe (ZETIA) 10 mg, Oral, Daily   famciclovir (FAMVIR) 500 mg, Oral, 3 times daily   famotidine-calcium carbonate-magnesium hydroxide (PEPCID COMPLETE) 10-800-165 MG chewable tablet Oral   fenofibrate (TRICOR) 145 mg, Oral, Daily   flecainide (TAMBOCOR) 150 MG tablet TAKE 1 TABLET(150 MG) BY MOUTH TWICE DAILY   gabapentin (NEURONTIN) 100 mg, Oral, 3 times daily   guaifenesin (HUMIBID E) 400 MG TABS tablet Take 1 tablet 3 times daily as  needed for chest congestion and cough   levocetirizine (XYZAL) 5 MG tablet TAKE 1 TABLET(5 MG) BY MOUTH EVERY EVENING   Magnesium 250 MG TABS 1 tablet, Oral, Weekly   metFORMIN (GLUCOPHAGE) 500 MG tablet TAKE 1 TABLET(500 MG) BY MOUTH TWICE DAILY WITH A MEAL   metoprolol tartrate (LOPRESSOR) 25 MG tablet Take one tablet by mouth twice a day.  May take one additional tablet by mouth as needed for breakthrough palpitations.   montelukast (SINGULAIR) 10 MG tablet TAKE 1 TABLET BY MOUTH 1 TIME AT NIGHT FOR COUGHING OR WHEEZING   pantoprazole (PROTONIX) 40 mg, Oral, Daily   PRESCRIPTION MEDICATION Inhalation, Daily at bedtime, CPAP    rivaroxaban (XARELTO) 20 MG TABS tablet TAKE 1 TABLET(20 MG) BY MOUTH DAILY WITH SUPPER   sildenafil (REVATIO) 20 MG tablet 2-5 tab one hour prior to sex.    Physical Exam: Vitals:   10/11/22 1019  Weight: 267 lb (121.1 kg)  Height: 5' 9.5" (1.765 m)    GEN- NAD. A&O x 3. Normal affect. HEENT: normocephalic, atraumatic Lungs- CTAB, Normal effort Heart- Regular rate and rhythm, No M/G/R Extremities- No peripheral edema. no clubbing or cyanosis; Skin- warm and dry, no rash or lesion  EKG is ordered today. Personal review shows NSR at 60 bpm with stable intervals  Additional studies reviewed include: Previous EP notes.    Assessment and Plan:  1. H/o SVT s/p ablation Denies recurrence  2. PAF /flutter Maintaining NSR by symptoms and recent EKG States has only felt palpitations 3-4 times since last seen.  Continue Xarelto Cautioned against frequent NSAID use with Xarelto.  3. Dyspnea Echo 10/2021 LVEF 60-65%, grade 2 DD Normal myoview 01/2022 Re-assurance given  4. Obesity Body mass index is 38.86 kg/m.  He is OK to consider for Ozempic/Wegovy from our perspective.  Encouraged activity as tolerated, and that he consider knee replacement if his arthritis as progressed to that point.    Follow up with Dr. Lovena Le in 6 months  Shirley Friar, PA-C  10/11/22 10:21 AM

## 2022-10-11 ENCOUNTER — Encounter: Payer: Self-pay | Admitting: Student

## 2022-10-11 ENCOUNTER — Ambulatory Visit: Payer: Medicare HMO | Attending: Student | Admitting: Student

## 2022-10-11 ENCOUNTER — Other Ambulatory Visit: Payer: Self-pay

## 2022-10-11 VITALS — BP 134/80 | HR 60 | Ht 69.5 in | Wt 267.0 lb

## 2022-10-11 DIAGNOSIS — I480 Paroxysmal atrial fibrillation: Secondary | ICD-10-CM | POA: Insufficient documentation

## 2022-10-11 DIAGNOSIS — I4892 Unspecified atrial flutter: Secondary | ICD-10-CM | POA: Insufficient documentation

## 2022-10-11 DIAGNOSIS — R0600 Dyspnea, unspecified: Secondary | ICD-10-CM | POA: Insufficient documentation

## 2022-10-11 DIAGNOSIS — I471 Supraventricular tachycardia, unspecified: Secondary | ICD-10-CM | POA: Insufficient documentation

## 2022-10-11 MED ORDER — FLECAINIDE ACETATE 150 MG PO TABS: ORAL_TABLET | ORAL | 3 refills | Status: DC

## 2022-10-11 MED ORDER — METOPROLOL TARTRATE 25 MG PO TABS: ORAL_TABLET | ORAL | 3 refills | Status: DC

## 2022-10-11 NOTE — Patient Instructions (Signed)
Medication Instructions:  Your physician recommends that you continue on your current medications as directed. Please refer to the Current Medication list given to you today.  *If you need a refill on your cardiac medications before your next appointment, please call your pharmacy*   Lab Work: None If you have labs (blood work) drawn today and your tests are completely normal, you will receive your results only by: [place] (if you have MyChart) OR A paper copy in the mail If you have any lab test that is abnormal or we need to change your treatment, we will call you to review the results.   Follow-Up: At [facility], you and your health needs are our priority.  As part of our continuing mission to provide you with exceptional heart care, we have created designated Provider Care Teams.  These Care Teams include your primary Cardiologist (physician) and Advanced Practice Providers (APPs -  Physician Assistants and Nurse Practitioners) who all work together to provide you with the care you need, when you need it.  Your next appointment:   6 month(s)  Provider:   Cristopher Peru, MD

## 2022-10-12 DIAGNOSIS — M170 Bilateral primary osteoarthritis of knee: Secondary | ICD-10-CM | POA: Diagnosis not present

## 2022-10-12 DIAGNOSIS — M1712 Unilateral primary osteoarthritis, left knee: Secondary | ICD-10-CM | POA: Diagnosis not present

## 2022-10-12 DIAGNOSIS — M1711 Unilateral primary osteoarthritis, right knee: Secondary | ICD-10-CM | POA: Diagnosis not present

## 2022-10-12 NOTE — Telephone Encounter (Signed)
Pharmacy, please advise. Thanks.

## 2022-10-18 ENCOUNTER — Other Ambulatory Visit (HOSPITAL_COMMUNITY): Payer: Self-pay

## 2022-10-18 NOTE — Telephone Encounter (Signed)
Per test claim PA not needed at this time, medication was filled on 02/17.

## 2022-10-26 ENCOUNTER — Encounter: Payer: Self-pay | Admitting: Neurology

## 2022-10-26 ENCOUNTER — Ambulatory Visit (INDEPENDENT_AMBULATORY_CARE_PROVIDER_SITE_OTHER): Payer: Medicare HMO | Admitting: Neurology

## 2022-10-26 DIAGNOSIS — G4733 Obstructive sleep apnea (adult) (pediatric): Secondary | ICD-10-CM

## 2022-10-26 DIAGNOSIS — I480 Paroxysmal atrial fibrillation: Secondary | ICD-10-CM

## 2022-10-26 DIAGNOSIS — R0609 Other forms of dyspnea: Secondary | ICD-10-CM

## 2022-10-26 DIAGNOSIS — Z6832 Body mass index (BMI) 32.0-32.9, adult: Secondary | ICD-10-CM | POA: Insufficient documentation

## 2022-10-26 NOTE — Patient Instructions (Signed)
Sleep Apnea Sleep apnea is a condition in which breathing pauses or becomes shallow during sleep. People with sleep apnea usually snore loudly. They may have times when they gasp and stop breathing for 10 seconds or more during sleep. This may happen many times during the night. Sleep apnea disrupts your sleep and keeps your body from getting the rest that it needs. This condition can increase your risk of certain health problems, including: Heart attack. Stroke. Obesity. Type 2 diabetes. Heart failure. Irregular heartbeat. High blood pressure. The goal of treatment is to help you breathe normally again. What are the causes?  The most common cause of sleep apnea is a collapsed or blocked airway. There are three kinds of sleep apnea: Obstructive sleep apnea. This kind is caused by a blocked or collapsed airway. Central sleep apnea. This kind happens when the part of the brain that controls breathing does not send the correct signals to the muscles that control breathing. Mixed sleep apnea. This is a combination of obstructive and central sleep apnea. What increases the risk? You are more likely to develop this condition if you: Are overweight. Smoke. Have a smaller than normal airway. Are older. Are male. Drink alcohol. Take sedatives or tranquilizers. Have a family history of sleep apnea. Have a tongue or tonsils that are larger than normal. What are the signs or symptoms? Symptoms of this condition include: Trouble staying asleep. Loud snoring. Morning headaches. Waking up gasping. Dry mouth or sore throat in the morning. Daytime sleepiness and tiredness. If you have daytime fatigue because of sleep apnea, you may be more likely to have: Trouble concentrating. Forgetfulness. Irritability or mood swings. Personality changes. Feelings of depression. Sexual dysfunction. This may include loss of interest if you are male, or erectile dysfunction if you are male. How is this  diagnosed? This condition may be diagnosed with: A medical history. A physical exam. A series of tests that are done while you are sleeping (sleep study). These tests are usually done in a sleep lab, but they may also be done at home. How is this treated? Treatment for this condition aims to restore normal breathing and to ease symptoms during sleep. It may involve managing health issues that can affect breathing, such as high blood pressure or obesity. Treatment may include: Sleeping on your side. Using a decongestant if you have nasal congestion. Avoiding the use of depressants, including alcohol, sedatives, and narcotics. Losing weight if you are overweight. Making changes to your diet. Quitting smoking. Using a device to open your airway while you sleep, such as: An oral appliance. This is a custom-made mouthpiece that shifts your lower jaw forward. A continuous positive airway pressure (CPAP) device. This device blows air through a mask when you breathe out (exhale). A nasal expiratory positive airway pressure (EPAP) device. This device has valves that you put into each nostril. A bi-level positive airway pressure (BIPAP) device. This device blows air through a mask when you breathe in (inhale) and breathe out (exhale). Having surgery if other treatments do not work. During surgery, excess tissue is removed to create a wider airway. Follow these instructions at home: Lifestyle Make any lifestyle changes that your health care provider recommends. Eat a healthy, well-balanced diet. Take steps to lose weight if you are overweight. Avoid using depressants, including alcohol, sedatives, and narcotics. Do not use any products that contain nicotine or tobacco. These products include cigarettes, chewing tobacco, and vaping devices, such as e-cigarettes. If you need help quitting, ask  your health care provider. General instructions Take over-the-counter and prescription medicines only as told  by your health care provider. If you were given a device to open your airway while you sleep, use it only as told by your health care provider. If you are having surgery, make sure to tell your health care provider you have sleep apnea. You may need to bring your device with you. Keep all follow-up visits. This is important. Contact a health care provider if: The device that you received to open your airway during sleep is uncomfortable or does not seem to be working. Your symptoms do not improve. Your symptoms get worse. Get help right away if: You develop: Chest pain. Shortness of breath. Discomfort in your back, arms, or stomach. You have: Trouble speaking. Weakness on one side of your body. Drooping in your face. These symptoms may represent a serious problem that is an emergency. Do not wait to see if the symptoms will go away. Get medical help right away. Call your local emergency services (911 in the [place] Do not drive yourself to the hospital. Summary Sleep apnea is a condition in which breathing pauses or becomes shallow during sleep. The most common cause is a collapsed or blocked airway. The goal of treatment is to restore normal breathing and to ease symptoms during sleep. This information is not intended to replace advice given to you by your health care provider. Make sure you discuss any questions you have with your health care provider. Document Revised: 03/18/2021 Document Reviewed: 07/18/2020 Elsevier Patient Education  [place].

## 2022-10-26 NOTE — Progress Notes (Signed)
Provider:  Larey Seat, MD  Primary Care Physician:  Elise [address] [place] [id number]     Referring Provider: Mackie Pai, [address] [place],  [place] [address]          Chief Complaint according to patient   Patient presents with:     New Patient (Initial Visit)           HISTORY OF PRESENT ILLNESS:  Richard Walker is a 67 y.o. male patient who is here for revisit 10/26/2022. He has gained further weight , is in pain and has osteoarthritis in both knees limiting his exercise tolerance.  I have spoken to him in my last visit  [phone] about Ozempic.  The patient had reached an HbA1c of 6.2, before he was placed on metformin and reduced it to 5.7.  But his weight has further increased. He is still prediabetic.   It is his main risk factor for sleep apnea.   he does use his CPAP compliantly and he has an average residual AHI of 0.1/h.  So this patient truly has reduced his apnea significantly he uses an auto CPAP between 6 and 15 cmH2O starting with a ramp at 4 cm water pressure.  He has used the machine 30 out of 30 days 99% 8 hours and 20 minutes on average.  So he is a poster child for compliance.    He has also very little air leakage.  What I can say is that the patient is frustrated about his still present hypersomnia and sleepiness and to struggle with weight.  Chief concern according to patient :  I still use CPAP but I get frustrated         Review of Systems: Out of a complete 14 system review, the patient complains of only the following symptoms, and all other reviewed systems are negative.:  Fatigue, sleepiness , snoring, fragmented sleep, Insomnia with knee pain.   How likely are you to doze in the following situations: 0 = not likely, 1 = slight chance, 2 = moderate chance, 3 = high chance   Sitting and Reading? Watching Television? Sitting inactive in a public place (theater or  meeting)? As a passenger in a car for an hour without a break? Lying down in the afternoon when circumstances permit? Sitting and talking to someone? Sitting quietly after lunch without alcohol? In a car, while stopped for a few minutes in traffic?    He currently endorses the Epworth Sleepiness Scale at 13 points/ 24 points   FSS endorsed at 40/ 63 points.   Social History   Socioeconomic History   Marital status: Married    Spouse name: Not on file   Number of children: Not on file   Years of education: Not on file   Highest education level: Not on file  Occupational History   Not on file  Tobacco Use   Smoking status: Former    Packs/day: 1.00    Years: 10.00    Total pack years: 10.00    Types: Cigarettes    Quit date: 13    Years since quitting: 32.1   Smokeless tobacco: Never   Tobacco comments:    Uses Hookah Pipe occasionally  Vaping Use   Vaping Use: Never used  Substance and Sexual Activity   Alcohol use: No   Drug use: No   Sexual activity:  Yes  Other Topics Concern   Not on file  Social History Narrative   Marital Status: Married Clinical biochemist)   Children:  4 (3 Sons/1 Daughter)   Pets:  None    Living Situation: Lives with spouse and 4 children   Origin:  He was born in [place].   Occupation: Catering manager)   Education: [place])   Tobacco Use/Exposure:  Smokes "special" tobacco from his home country ([place]).     Alcohol Use:  None   Drug Use:  None   Diet:  Regular   Exercise:  None   Hobbies:  Cards   Social Determinants of Health   Financial Resource Strain: High Risk (12/25/2021)   Overall Financial Resource Strain (CARDIA)    Difficulty of Paying Living Expenses: Very hard  Food Insecurity: Food Insecurity Present (12/25/2021)   Hunger Vital Sign    Worried About Running Out of Food in the Last Year: Often true    Ran Out of Food in the Last Year: Often true  Transportation Needs: No Transportation  Needs (12/23/2021)   PRAPARE - Hydrologist (Medical): No    Lack of Transportation (Non-Medical): No  Physical Activity: Inactive (12/23/2021)   Exercise Vital Sign    Days of Exercise per Week: 0 days    Minutes of Exercise per Session: 0 min  Stress: No Stress Concern Present (12/23/2021)   [place]    Feeling of Stress : Not at all  Social Connections: Not on file    Family History  Problem Relation Age of Onset   Hypertension Father    Food Allergy Son    Colon cancer Neg Hx    Stomach cancer Neg Hx    Allergic rhinitis Neg Hx    Angioedema Neg Hx    Asthma Neg Hx    Eczema Neg Hx    Immunodeficiency Neg Hx    Urticaria Neg Hx     Past Medical History:  Diagnosis Date   DDD (degenerative disc disease), lumbar    L3/L4   Diverticulosis    GERD (gastroesophageal reflux disease)    Hyperlipidemia    Low sperm motility    Pre-diabetes    Sleep apnea    uses cpap    SVT (supraventricular tachycardia)     Past Surgical History:  Procedure Laterality Date   AMPUTATION Left 07/15/2018   Procedure: . REPAIR OF LEFT INDEX AND LONG FINGER BY FUSION /  REPAIR OF RING FINGER;  Surgeon: Milly Jakob, MD;  Location: [facility];  Service: Orthopedics;  Laterality: Left;   BACK SURGERY     CARDIAC CATHETERIZATION N/A 12/22/2015   Procedure: Left Heart Cath and Coronary Angiography;  Surgeon: Peter M Martinique, MD;  Location: [facility];  Service: Cardiovascular;  Laterality: N/A;   CARDIAC ELECTROPHYSIOLOGY STUDY AND ABLATION     ELECTROPHYSIOLOGIC STUDY N/A 02/19/2016   Procedure: SVT Ablation;  Surgeon: Evans Lance, MD;  Location: [facility];  Service: Cardiovascular;  Laterality: N/A;   septal reconstruction  2017   TURBINATE REDUCTION Bilateral 07/2016   WISDOM TOOTH EXTRACTION       Current Outpatient Medications on File Prior to Visit  Medication Sig Dispense Refill    acetaminophen (TYLENOL) 325 MG tablet Take 2 tablets (650 mg total) by mouth every 6 (six) hours.     albuterol (PROAIR HFA) 108 (90 Base) MCG/ACT inhaler Inhale  1-2 puffs into the lungs every 6 (six) hours as needed for wheezing or shortness of breath. 24 g 3   azelastine (ASTELIN) 0.1 % nasal spray 1-2 sprays per nostril 2 times daily as needed. 90 mL 3   benzonatate (TESSALON) 100 MG capsule Take 1 capsule (100 mg total) by mouth 3 (three) times daily as needed for cough. 30 capsule 0   Budeson-Glycopyrrol-Formoterol (BREZTRI AEROSPHERE) 160-9-4.8 MCG/ACT AERO Inhale 2 puffs into the lungs in the morning and at bedtime. 5.9 g 5   Budeson-Glycopyrrol-Formoterol (BREZTRI AEROSPHERE) 160-9-4.8 MCG/ACT AERO Inhale 2 puffs into the lungs in the morning and at bedtime. 5.9 g 0   ezetimibe (ZETIA) 10 MG tablet Take 1 tablet (10 mg total) by mouth daily. 90 tablet 3   famciclovir (FAMVIR) 500 MG tablet Take 1 tablet (500 mg total) by mouth 3 (three) times daily. 30 tablet 0   famotidine-calcium carbonate-magnesium hydroxide (PEPCID COMPLETE) 10-800-165 MG chewable tablet Chew by mouth.     fenofibrate (TRICOR) 145 MG tablet Take 1 tablet (145 mg total) by mouth daily. 90 tablet 3   flecainide (TAMBOCOR) 150 MG tablet TAKE 1 TABLET(150 MG) BY MOUTH TWICE DAILY 180 tablet 3   gabapentin (NEURONTIN) 100 MG capsule Take 1 capsule (100 mg total) by mouth 3 (three) times daily. 90 capsule 0   guaifenesin (HUMIBID E) 400 MG TABS tablet Take 1 tablet 3 times daily as needed for chest congestion and cough 21 tablet 0   ibuprofen (ADVIL) 600 MG tablet Take 600 mg by mouth in the morning and at bedtime.     levocetirizine (XYZAL) 5 MG tablet TAKE 1 TABLET(5 MG) BY MOUTH EVERY EVENING 90 tablet 2   Magnesium 250 MG TABS Take 1 tablet by mouth once a week.     metFORMIN (GLUCOPHAGE) 500 MG tablet TAKE 1 TABLET(500 MG) BY MOUTH TWICE DAILY WITH A MEAL 60 tablet 11   metoprolol tartrate (LOPRESSOR) 25 MG tablet Take  one tablet by mouth twice a day.  May take one additional tablet by mouth as needed for breakthrough palpitations. 270 tablet 3   montelukast (SINGULAIR) 10 MG tablet TAKE 1 TABLET BY MOUTH 1 TIME AT NIGHT FOR COUGHING OR WHEEZING 90 tablet 3   pantoprazole (PROTONIX) 40 MG tablet Take 1 tablet (40 mg total) by mouth daily. 30 tablet 11   PRESCRIPTION MEDICATION Inhale into the lungs at bedtime. CPAP     rivaroxaban (XARELTO) 20 MG TABS tablet TAKE 1 TABLET(20 MG) BY MOUTH DAILY WITH SUPPER 90 tablet 1   sildenafil (REVATIO) 20 MG tablet 2-5 tab one hour prior to sex. 40 tablet 0   No current facility-administered medications on file prior to visit.    Allergies  Allergen Reactions   Dust Mite Extract    Pork-Derived Products Swelling   Statins     Muscle aches   Tree Extract Other (See Comments)    Intolerance      DIAGNOSTIC DATA (LABS, IMAGING, TESTING) - I reviewed patient records, labs, notes, testing and imaging myself where available.  Lab Results  Component Value Date   WBC 6.5 04/21/2022   HGB 15.3 04/21/2022   HCT 46.0 04/21/2022   MCV 85.1 04/21/2022   PLT 198.0 04/21/2022      Component Value Date/Time   NA 140 08/31/2022 1027   K 4.2 08/31/2022 1027   CL 103 08/31/2022 1027   CO2 28 08/31/2022 1027   GLUCOSE 105 (H) 08/31/2022 1027   BUN 20  08/31/2022 1027   CREATININE 1.19 08/31/2022 1027   CREATININE 1.18 02/12/2016 1508   CALCIUM 9.5 08/31/2022 1027   PROT 6.8 08/31/2022 1027   ALBUMIN 4.3 08/31/2022 1027   AST 15 08/31/2022 1027   ALT 21 08/31/2022 1027   ALKPHOS 50 08/31/2022 1027   BILITOT 0.5 08/31/2022 1027   GFRNONAA 58 (L) 01/21/2021 1335   GFRNONAA >89 03/04/2014 1456   GFRAA >60 07/15/2018 1324   GFRAA >89 03/04/2014 1456   Lab Results  Component Value Date   CHOL 221 (H) 08/31/2022   HDL 42.20 08/31/2022   LDLCALC 144 (H) 08/31/2022   LDLDIRECT 142.0 08/22/2018   TRIG 173.0 (H) 08/31/2022   CHOLHDL 5 08/31/2022   Lab Results   Component Value Date   HGBA1C 5.8 08/31/2022   Lab Results  Component Value Date   VITAMINB12 966 (H) 04/21/2022   Lab Results  Component Value Date   TSH 0.95 04/21/2022    PHYSICAL EXAM:  There were no vitals filed for this visit. There is no height or weight on file to calculate BMI.   Wt Readings from Last 3 Encounters:  10/11/22 267 lb (121.1 kg)  10/04/22 265 lb 12.8 oz (120.6 kg)  09/13/22 265 lb (120.2 kg)     Ht Readings from Last 3 Encounters:  10/11/22 5' 9.5" (1.765 m)  10/04/22 '5\' 9"'$  (1.753 m)  09/13/22 '5\' 9"'$  (1.753 m)      General: The patient is awake, alert and appears not in acute distress. Head: Normocephalic, atraumatic. Neck is supple. Mallampati 3 plus ,  neck circumference:19 inches . Nasal airflow  patent.  facial hair. Retrognathia is seen.  Dental status:  Cardiovascular:  Regular rate and cardiac rhythm by pulse,  without distended neck veins. Respiratory: Lungs are clear to auscultation.  Skin:  Without evidence of ankle edema, or rash. Trunk: The patient's posture is erect.   NEUROLOGIC EXAM: The patient is awake and alert, oriented to place and time.   Memory subjective described as intact.  Attention span & concentration ability appears normal.  Speech is fluent,  without  dysarthria, dysphonia or aphasia.  Mood and affect are appropriate.   Cranial nerves: no loss of smell or taste reported  Pupils are equal and briskly reactive to light. Funduscopic exam deferred..  Extraocular movements in vertical and horizontal planes were intact and without nystagmus.  No Diplopia. Visual fields by finger perimetry are intact. Hearing was intact to soft voice and finger rubbing.    Facial sensation intact to fine touch.  Facial motor strength is symmetric and tongue and uvula move midline.  Neck ROM : rotation, tilt and flexion extension were normal for age and shoulder shrug was symmetrical.    Motor exam:  Symmetric bulk, tone and ROM.   Normal tone .  Sensory:  Fine touch and vibration were tested  and  normal.  Proprioception tested in the upper extremities was normal.  Coordination: Rapid alternating movements in the fingers/hands were of normal speed.   Gait and station: Patient could rise unassisted from a seated position, walked without assistive device.  Stance is of normal width/ base and the patient turned with 4 steps.  Toe and heel walk were deferred.  Deep tendon reflexes: in the upper and lower extremities are symmetric and intact.  Babinski response was deferred .    ASSESSMENT AND PLAN 67 y.o. year old male  here with:    1) OSA on CPA. Well controlled apnea and  high compliance.   2) risk factor is obesity affecting : BMI, Airway anatomy, and neck size.   3) I would strongly recommend any weight loss medication use to help him achieve pain reduction and OSA improvement.   I plan to follow up either personally or through our NP within 12 months.   I would like to thank Saguier, Percell Miller, PA-C for allowing me to meet with and to take care of this pleasant patient.   After spending a total time of  30  minutes face to face and additional time for physical and neurologic examination, review of laboratory studies,  personal review of imaging studies, reports and results of other testing and review of referral information / records as far as provided in visit,   Electronically signed by: Larey Seat, MD 10/26/2022 3:39 PM  [facility] and [facility] Board certified by The [employer] of Sleep Medicine and [place] of the [employer] of Sleep Medicine. Board certified In Neurology through the [place], Fellow of the [employer] of Neurology. Medical Director of [facility].

## 2022-11-25 ENCOUNTER — Ambulatory Visit (INDEPENDENT_AMBULATORY_CARE_PROVIDER_SITE_OTHER): Payer: Medicare HMO | Admitting: Medical

## 2022-11-25 ENCOUNTER — Encounter: Payer: Self-pay | Admitting: Medical

## 2022-11-25 VITALS — BP 130/80 | HR 55 | Temp 98.0°F | Resp 18 | Ht 69.5 in | Wt 264.5 lb

## 2022-11-25 DIAGNOSIS — R739 Hyperglycemia, unspecified: Secondary | ICD-10-CM | POA: Diagnosis not present

## 2022-11-25 DIAGNOSIS — Z6835 Body mass index (BMI) 35.0-35.9, adult: Secondary | ICD-10-CM | POA: Diagnosis not present

## 2022-11-25 DIAGNOSIS — I10 Essential (primary) hypertension: Secondary | ICD-10-CM | POA: Diagnosis not present

## 2022-11-25 DIAGNOSIS — M25562 Pain in left knee: Secondary | ICD-10-CM | POA: Diagnosis not present

## 2022-11-25 DIAGNOSIS — M25561 Pain in right knee: Secondary | ICD-10-CM

## 2022-11-25 DIAGNOSIS — I480 Paroxysmal atrial fibrillation: Secondary | ICD-10-CM

## 2022-11-25 DIAGNOSIS — E785 Hyperlipidemia, unspecified: Secondary | ICD-10-CM

## 2022-11-25 MED ORDER — LEVOCETIRIZINE DIHYDROCHLORIDE 5 MG PO TABS: 5.0000 mg | ORAL_TABLET | Freq: Every evening | ORAL | 3 refills | Status: DC

## 2022-11-25 NOTE — Patient Instructions (Addendum)
1. Hyperlipidemia, unspecified hyperlipidemia type Continue zetia and fenofibrate  2. Hypertension, unspecified type Bp controlled with metoprolol.  3. Class 2 severe obesity due to excess calories with serious comorbidity and body mass index (BMI) of 35.0 to 35.9 in adult Recommend diet and exercise if possible but realize not realistic due to knee pain. Consider wegovy depedning on A1c level.  4. PAF (paroxysmal atrial fibrillation) Continue current meds rx'd by cardiologist and [place].   5. Pain in both knees, unspecified chronicity  Continue with orthopedist  6. Elevated sugar. If A1c 6.5 or over rx ozempic. Rx advisement given.   Follow up date to be determined after lab review.

## 2022-11-25 NOTE — Progress Notes (Signed)
Subjective:    Patient ID: Richard Walker, male    DOB: 08/12/1956, 67 y.o.   MRN: [id number]  HPI  Pt in for follow up.    Pt has been trrying to loose weight. He is of opinion that various health problems would improve if he can loose weight.   Pt had knee pain. Pt states has pain in both knees. Pt states hurts for him to stand. No longer working. Pt taking at times taking ibuprofen for knee pain.   IMPRESSION: 1. Complex tearing of the medial meniscal body and posterior horn. 2. Grade 1 MCL sprain. 3. 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 4. Small knee joint effusion. 5. Mild distal popliteus and proximal patellar tendinosis.  Pt bmi is 43. Pt has been fasting through Ramadan and notes has not lost any weight. Pt has been on metformin for prediabetes and has not lost weight. Before ramadan stats was dieting and did not lose weight.   Htn of htn- pt is on meoprolol. When pt checks at [place] most of time is 120/80.  [name] PAF /flutter Maintaining NSR by symptoms and recent EKG States has only felt palpitations 3-4 times since last seen. Continue Xarelto Cautioned against frequent NSAID use with Xarelto.  Chronic bronchitis. Moderate persistent asthma with allergic profile. Perennial and seasonal allergic rhinitis. - previously seen by Dr. Verlin Fester with Asthma/Allergy (last in March 2019) - reaction to grass pollen, tree pollen, cockroach antigen, and dust mite antigen - has elevated IgE - will change from symbicort to breztri; sample provided - continue azelastine, fluticasone nasal spray, montelukast and xyzal -  f/u CXR, PFT - might need to consider a biologic agent    Review of Systems  Constitutional:  Negative for chills, fatigue and fever.  Respiratory:  Negative for cough, chest tightness and wheezing.   Cardiovascular:  Negative for chest pain and palpitations.  Gastrointestinal:  Negative for abdominal pain.  Genitourinary:   Negative for dysuria and frequency.  Musculoskeletal:        Bilateral knee pain  Neurological:  Negative for dizziness, weakness and headaches.  Hematological:  Negative for adenopathy. Does not bruise/bleed easily.  Psychiatric/Behavioral:  Negative for behavioral problems, decreased concentration, dysphoric mood, sleep disturbance and suicidal ideas.    Past Medical History:  Diagnosis Date   DDD (degenerative disc disease), lumbar    L3/L4   Diverticulosis    GERD (gastroesophageal reflux disease)    Hyperlipidemia    Low sperm motility    Pre-diabetes    Sleep apnea    uses cpap    SVT (supraventricular tachycardia)      Social History   Socioeconomic History   Marital status: Married    Spouse name: Not on file   Number of children: Not on file   Years of education: Not on file   Highest education level: Associate degree: occupational, Hotel manager, or vocational program  Occupational History   Not on file  Tobacco Use   Smoking status: Former    Packs/day: 1.00    Years: 10.00    Additional pack years: 0.00    Total pack years: 10.00    Types: Cigarettes    Quit date: 1992    Years since quitting: 32.2   Smokeless tobacco: Never   Tobacco comments:    Uses Hookah Pipe occasionally  Vaping Use   Vaping Use: Never used  Substance and Sexual Activity   Alcohol use: No   Drug use: No   Sexual  activity: Yes  Other Topics Concern   Not on file  Social History Narrative   Marital Status: Married Clinical biochemist)   Children:  4 (3 Sons/1 Daughter)   Pets:  None    Living Situation: Lives with spouse and 4 children   Origin:  He was born in [place].   Occupation: Catering manager)   Education: Land)   Tobacco Use/Exposure:  Smokes "special" tobacco from his home country ([place]     Alcohol Use:  None   Drug Use:  None   Diet:  Regular   Exercise:  None   Hobbies:  Cards   Social Determinants of Health   Financial  Resource Strain: High Risk (11/24/2022)   Overall Financial Resource Strain (CARDIA)    Difficulty of Paying Living Expenses: Very hard  Food Insecurity: Food Insecurity Present (11/24/2022)   Hunger Vital Sign    Worried About Running Out of Food in the Last Year: Often true    Ran Out of Food in the Last Year: Sometimes true  Transportation Needs: Patient Declined (11/24/2022)   PRAPARE - Transportation    Lack of Transportation (Medical): Patient declined    Lack of Transportation (Non-Medical): Patient declined  Physical Activity: Inactive (12/23/2021)   Exercise Vital Sign    Days of Exercise per Week: 0 days    Minutes of Exercise per Session: 0 min  Stress: No Stress Concern Present (12/23/2021)   [employer] [place] of Stress : Not at all  Social Connections: Socially Integrated (11/24/2022)   Social Connection and Isolation Panel [NHANES]    Frequency of Communication with Friends and Family: More than three times a week    Frequency of Social Gatherings with Friends and Family: Twice a week    Attends Religious Services: More than 4 times per year    Active Member of [employer] or Organizations: Yes    Attends Archivist Meetings: Patient declined    Marital Status: Married  Human resources officer Violence: Not on file    Past Surgical History:  Procedure Laterality Date   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 REPAIR OF LEFT INDEX AND LONG FINGER BY FUSION /  REPAIR OF RING FINGER;  Surgeon: Milly Jakob, MD;  Location: [facility];  Service: Orthopedics;  Laterality: Left;   BACK SURGERY     CARDIAC CATHETERIZATION N/A 12/22/2015   Procedure: Left Heart Cath and Coronary Angiography;  Surgeon: Peter M Martinique, MD;  Location: [facility];  Service: Cardiovascular;  Laterality: N/A;   CARDIAC ELECTROPHYSIOLOGY STUDY AND ABLATION     ELECTROPHYSIOLOGIC STUDY N/A 02/19/2016   Procedure: SVT Ablation;  Surgeon:  Evans Lance, MD;  Location: [facility];  Service: Cardiovascular;  Laterality: N/A;   septal reconstruction  2017   TURBINATE REDUCTION Bilateral 07/2016   WISDOM TOOTH EXTRACTION      Family History  Problem Relation Age of Onset   Hypertension Father    Food Allergy Son    Colon cancer Neg Hx    Stomach cancer Neg Hx    Allergic rhinitis Neg Hx    Angioedema Neg Hx    Asthma Neg Hx    Eczema Neg Hx    Immunodeficiency Neg Hx    Urticaria Neg Hx     Allergies  Allergen Reactions   Dust Mite Extract    Pork-Derived Products Swelling   Statins  Muscle aches   Tree Extract Other (See Comments)    Intolerance     Current Outpatient Medications on File Prior to Visit  Medication Sig Dispense Refill   acetaminophen (TYLENOL) 325 MG tablet Take 2 tablets (650 mg total) by mouth every 6 (six) hours.     albuterol (PROAIR HFA) 108 (90 Base) MCG/ACT inhaler Inhale 1-2 puffs into the lungs every 6 (six) hours as needed for wheezing or shortness of breath. 24 g 3   azelastine (ASTELIN) 0.1 % nasal spray 1-2 sprays per nostril 2 times daily as needed. 90 mL 3   benzonatate (TESSALON) 100 MG capsule Take 1 capsule (100 mg total) by mouth 3 (three) times daily as needed for cough. 30 capsule 0   Budeson-Glycopyrrol-Formoterol (BREZTRI AEROSPHERE) 160-9-4.8 MCG/ACT AERO Inhale 2 puffs into the lungs in the morning and at bedtime. 5.9 g 0   ezetimibe (ZETIA) 10 MG tablet Take 1 tablet (10 mg total) by mouth daily. 90 tablet 3   famotidine-calcium carbonate-magnesium hydroxide (PEPCID COMPLETE) 10-800-165 MG chewable tablet Chew by mouth.     fenofibrate (TRICOR) 145 MG tablet Take 1 tablet (145 mg total) by mouth daily. 90 tablet 3   flecainide (TAMBOCOR) 150 MG tablet TAKE 1 TABLET(150 MG) BY MOUTH TWICE DAILY 180 tablet 3   ibuprofen (ADVIL) 600 MG tablet Take 600 mg by mouth in the morning and at bedtime.     levocetirizine (XYZAL) 5 MG tablet TAKE 1 TABLET(5 MG) BY MOUTH  EVERY EVENING 90 tablet 2   Magnesium 250 MG TABS Take 1 tablet by mouth once a week.     metFORMIN (GLUCOPHAGE) 500 MG tablet TAKE 1 TABLET(500 MG) BY MOUTH TWICE DAILY WITH A MEAL 60 tablet 11   metoprolol tartrate (LOPRESSOR) 25 MG tablet Take one tablet by mouth twice a day.  May take one additional tablet by mouth as needed for breakthrough palpitations. 270 tablet 3   montelukast (SINGULAIR) 10 MG tablet TAKE 1 TABLET BY MOUTH 1 TIME AT NIGHT FOR COUGHING OR WHEEZING 90 tablet 3   pantoprazole (PROTONIX) 40 MG tablet Take 1 tablet (40 mg total) by mouth daily. 30 tablet 11   PRESCRIPTION MEDICATION Inhale into the lungs at bedtime. CPAP     sildenafil (REVATIO) 20 MG tablet 2-5 tab one hour prior to sex. 40 tablet 0   No current facility-administered medications on file prior to visit.    BP 130/80   Pulse (!) 55   Temp 98 F (36.7 C)   Resp 18   Ht 5' 9.5" (1.765 m)   Wt 264 lb 8 oz (120 kg)   SpO2 94%   BMI 38.50 kg/m        Objective:   Physical Exam  General Mental Status- Alert. General Appearance- Not in acute distress.   Skin General: Color- Normal Color. Moisture- Normal Moisture.  Neck Carotid Arteries- Normal color. Moisture- Normal Moisture. No carotid bruits. No JVD.  Chest and Lung Exam Auscultation: Breath Sounds:-Normal.  Cardiovascular Auscultation:Rythm- Regular. Murmurs & Other Heart Sounds:Auscultation of the heart reveals- No Murmurs.  Abdomen Inspection:-Inspeection Normal.(Protuberant obese abdomen) Palpation/Percussion:Note:No mass. Palpation and Percussion of the abdomen reveal- Non Tender, Non Distended + BS, no rebound or guarding.    Neurologic Cranial Nerve exam:- CN III-XII intact(No nystagmus), symmetric smile. Strength:- 5/5 equal and symmetric strength both upper and lower extremities.   Lower ext- bilateral crepitus on flexion and extension.     Assessment & Plan:   Patient Instructions  1. Hyperlipidemia, unspecified  hyperlipidemia type Continue zetia and fenofibrate  2. Hypertension, unspecified type Bp controlled with metoprolol.  3. Class 2 severe obesity due to excess calories with serious comorbidity and body mass index (BMI) of 35.0 to 35.9 in adult Recommend diet and exercise if possible but realize not realistic due to knee pain. Consider wegovy depedning on A1c level.  4. PAF (paroxysmal atrial fibrillation) Continue current meds rx'd by cardiologist and [place].   5. Pain in both knees, unspecified chronicity  Continue with orthopedist  6. Elevated sugar. If A1c 6.5 or over rx ozempic. Rx advisement given.   Follow up date to be determined after lab review.   Mackie Pai, PA-C

## 2022-12-06 ENCOUNTER — Encounter (HOSPITAL_BASED_OUTPATIENT_CLINIC_OR_DEPARTMENT_OTHER): Payer: Self-pay | Admitting: Pulmonary Disease

## 2022-12-06 ENCOUNTER — Encounter: Payer: Self-pay | Admitting: *Deleted

## 2022-12-06 ENCOUNTER — Ambulatory Visit (INDEPENDENT_AMBULATORY_CARE_PROVIDER_SITE_OTHER): Payer: Medicare HMO | Admitting: Pulmonary Disease

## 2022-12-06 VITALS — BP 122/70 | HR 56 | Temp 98.2°F | Ht 69.0 in | Wt 259.8 lb

## 2022-12-06 DIAGNOSIS — M25561 Pain in right knee: Secondary | ICD-10-CM

## 2022-12-06 DIAGNOSIS — R0609 Other forms of dyspnea: Secondary | ICD-10-CM | POA: Diagnosis not present

## 2022-12-06 DIAGNOSIS — G8929 Other chronic pain: Secondary | ICD-10-CM

## 2022-12-06 DIAGNOSIS — M545 Low back pain, unspecified: Secondary | ICD-10-CM

## 2022-12-06 DIAGNOSIS — J418 Mixed simple and mucopurulent chronic bronchitis: Secondary | ICD-10-CM | POA: Diagnosis not present

## 2022-12-06 DIAGNOSIS — J4550 Severe persistent asthma, uncomplicated: Secondary | ICD-10-CM | POA: Diagnosis not present

## 2022-12-06 DIAGNOSIS — M25562 Pain in left knee: Secondary | ICD-10-CM | POA: Diagnosis not present

## 2022-12-06 MED ORDER — BREZTRI AEROSPHERE 160-9-4.8 MCG/ACT IN AERO: 2.0000 | INHALATION_SPRAY | Freq: Two times a day (BID) | RESPIRATORY_TRACT | 5 refills | Status: DC

## 2022-12-06 NOTE — Progress Notes (Signed)
Pulmonary, Critical Care, and Sleep Medicine  Chief Complaint  Patient presents with   Follow-up    Follow up. Patient has no complaints.     Constitutional:  BP 122/70 (BP Location: Left Arm, Patient Position: Sitting, Cuff Size: Normal)   Pulse (!) 56   Temp 98.2 F (36.8 C) (Oral)   Ht 5\' 9"  (1.753 m)   Wt 259 lb 12.8 oz (117.8 kg)   SpO2 99%   BMI 38.37 kg/m   Past Medical History:  SVT, Atrial fibrillation/flutter, OSA  Past Surgical History:  His  has a past surgical history that includes Back surgery; Wisdom tooth extraction; Cardiac catheterization (N/A, 12/22/2015); Cardiac catheterization (N/A, 02/19/2016); Cardiac electrophysiology study and ablation; septal reconstruction (2017); Turbinate reduction (Bilateral, 07/2016); and Amputation (Left, 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  Brief Summary:  Richard Walker is a 67 y.o. male former smoker with dyspnea and cough.       Subjective:   Chest xray from 10/05/22 showed mild Lt base atelectasis, but otherwise was normal.  He was confused about his inhaler regimen.  Only using breztri once per day.  He didn't get PFT scheduled yet.    Has some cough.  He is having more trouble with his back and knee pain.  These limit his activity level.  He wants to exercise more to get his weight down.     Physical Exam:   Appearance - well kempt   ENMT - no sinus tenderness, no oral exudate, no LAN, Mallampati 3 airway, no stridor  Respiratory - equal breath sounds bilaterally, no wheezing or rales  CV - s1s2 regular rate and rhythm, no murmurs  Ext - no clubbing, no edema  Skin - no rashes  Psych - normal mood and affect      Pulmonary testing:  PFT 10/31/17 >> FEV1 2.29 64%), FEV1% 83, TLC 5.59 (79%), DLCO 77% IgE 05/28/20 >> 151 PFT 06/24/20 >> FEV1 2.86 (82%), FEV1% 83, TLC 3.98 (56%), DLCO 99%, +BD  Chest Imaging:  HRCT chest 10/21/17 >> atherosclerosis, scarring RML and lingula, hepatic steatosis HRCT chest 07/16/20 >>  aortic atherosclerosis, calcified atherosclerotic plaque of Lt main/LAD/LCX/RCA, small thin walled cyst in lateral segment of RML, scarring inferior segment of lingula, fatty liver, left renal cyst  Sleep Tests:  PSG 12/29/15 >> AHI 69.8, SpO2 low 89% ONO with CPAP 05/31/18 >> test time 8 hrs 54 min.  Baseline SpO2 95.9%, low SpO2 93%. HST 03/18/21 >> AHI 50.9, SpO2 low 88%  Cardiac Tests:  LHC 12/22/15 >> mild, non obstructive CAD, normal LV fx CPST 05/24/18 >> submaximal effort, mild chronotropic incompetence CPST 09/02/20 >> submaximal effort, deconditioning, mild restriction from body habitus, no clear cardiopulmonary limitation Echo 10/29/21 >> EF 60 to 65%, grade 2 DD, mild MR, aortic root 41 mm  Social History:  He  reports that he quit smoking about 32 years ago. His smoking use included cigarettes. He has a 10.00 pack-year smoking history. He has never used smokeless tobacco. He reports that he does not drink alcohol and does not use drugs.  Family History:  His family history includes Food Allergy in his son; Hypertension in his father.     Assessment/Plan:   Chronic bronchitis. Moderate persistent asthma with allergic profile. Perennial and seasonal allergic rhinitis. - previously seen by Dr. Nunzio Cobbs with Asthma/Allergy (last in March 2019) - reaction to grass pollen, tree pollen, cockroach antigen, and dust mite antigen - has elevated IgE - discussed roles of his inhalers - advised him  to use breztri two puffs bid - continue azelastine, xyzal, and singulair - prn albuterol - will make sure his PFT gets scheduled  Allergic conjunctivitis. - olopatadine was too expensive  Restrictive lung disease. - related to obesity and body habitus - PFT pending  Back pain, bilateral knee pain. - will arrange for referral to [facility]  Obstructive sleep apnea. - managed by Dr. Vickey Huger with [facility]  Atrial fibrillation/flutter. - followed by Dr. Lewayne Bunting with [facility]  Time Spent Involved in Patient Care on Day of Examination:  36 minutes  Follow up:   Patient Instructions  Breztri two puffs in the morning and two puffs in the evening, and rinse your mouth after each use.  Will arrange for referral to sports medicine to help with back and knee pain.  Will schedule follow up in 2 to 3 months after your pulmonary function test is done.  Medication List:   Allergies as of 12/06/2022       Reactions   Dust Mite Extract    Pork-derived Products Swelling   Statins    Muscle aches   Tree Extract Other (See Comments)   Intolerance        Medication List        Accurate as of December 06, 2022  9:44 AM. If you have any questions, ask your nurse or doctor.          acetaminophen 325 MG tablet Commonly known as: Tylenol Take 2 tablets (650 mg total) by mouth every 6 (six) hours.   albuterol 108 (90 Base) MCG/ACT inhaler Commonly known as: ProAir HFA Inhale 1-2 puffs into the lungs every 6 (six) hours as needed for wheezing or shortness of breath.   azelastine 0.1 % nasal spray Commonly known as: ASTELIN 1-2 sprays per nostril 2 times daily as needed.   benzonatate 100 MG capsule Commonly known as: TESSALON Take 1 capsule (100 mg total) by mouth 3 (three) times daily as needed for cough.   Breztri Aerosphere 160-9-4.8 MCG/ACT Aero Generic drug: Budeson-Glycopyrrol-Formoterol Inhale 2 puffs into the lungs in the morning and at bedtime.   ezetimibe 10 MG tablet Commonly known as: Zetia Take 1 tablet (10 mg total) by mouth daily.   famotidine-calcium carbonate-magnesium hydroxide 10-800-165 MG chewable tablet Commonly known as: PEPCID COMPLETE Chew by mouth.   fenofibrate 145 MG tablet Commonly known as: TRICOR Take 1 tablet (145 mg total) by mouth daily.   flecainide 150 MG tablet Commonly known as: TAMBOCOR TAKE 1 TABLET(150 MG) BY MOUTH TWICE DAILY   ibuprofen 600 MG tablet Commonly known as:  ADVIL Take 600 mg by mouth in the morning and at bedtime.   levocetirizine 5 MG tablet Commonly known as: XYZAL Take 1 tablet (5 mg total) by mouth every evening. What changed: Another medication with the same name was removed. Continue taking this medication, and follow the directions you see here. Changed by: Coralyn Helling, MD   Magnesium 250 MG Tabs Take 1 tablet by mouth once a week.   metFORMIN 500 MG tablet Commonly known as: GLUCOPHAGE TAKE 1 TABLET(500 MG) BY MOUTH TWICE DAILY WITH A MEAL   metoprolol tartrate 25 MG tablet Commonly known as: LOPRESSOR Take one tablet by mouth twice a day.  May take one additional tablet by mouth as needed for breakthrough palpitations.   montelukast 10 MG tablet Commonly known as: SINGULAIR TAKE 1 TABLET BY MOUTH 1 TIME AT NIGHT FOR COUGHING OR WHEEZING  pantoprazole 40 MG tablet Commonly known as: PROTONIX Take 1 tablet (40 mg total) by mouth daily.   PRESCRIPTION MEDICATION Inhale into the lungs at bedtime. CPAP   sildenafil 20 MG tablet Commonly known as: REVATIO 2-5 tab one hour prior to sex.        Signature:  Coralyn Helling, MD [facility] Pulmonary/Critical Care Pager - [phone] 12/06/2022, 9:44 AM

## 2022-12-06 NOTE — Patient Instructions (Signed)
Breztri two puffs in the morning and two puffs in the evening, and rinse your mouth after each use.  Will arrange for referral to sports medicine to help with back and knee pain.  Will schedule follow up in 2 to 3 months after your pulmonary function test is done.

## 2022-12-07 ENCOUNTER — Other Ambulatory Visit (INDEPENDENT_AMBULATORY_CARE_PROVIDER_SITE_OTHER): Payer: Medicare HMO

## 2022-12-07 DIAGNOSIS — R739 Hyperglycemia, unspecified: Secondary | ICD-10-CM

## 2022-12-07 LAB — COMPREHENSIVE METABOLIC PANEL
ALT: 17 U/L (ref 0–53)
AST: 17 U/L (ref 0–37)
Albumin: 4.1 g/dL (ref 3.5–5.2)
Alkaline Phosphatase: 61 U/L (ref 39–117)
BUN: 16 mg/dL (ref 6–23)
CO2: 29 mEq/L (ref 19–32)
Calcium: 9.4 mg/dL (ref 8.4–10.5)
Chloride: 105 mEq/L (ref 96–112)
Creatinine, Ser: 1.1 mg/dL (ref 0.40–1.50)
GFR: 69.97 mL/min (ref >60.00)
Glucose, Bld: 103 mg/dL — ABNORMAL HIGH (ref 70–99)
Potassium: 4.1 mEq/L (ref 3.5–5.1)
Sodium: 139 mEq/L (ref 135–145)
Total Bilirubin: 0.4 mg/dL (ref 0.2–1.2)
Total Protein: 6.4 g/dL (ref 6.0–8.3)

## 2022-12-07 LAB — HEMOGLOBIN A1C: Hgb A1c MFr Bld: 5.7 % (ref 4.6–6.5)

## 2022-12-07 MED ORDER — SEMAGLUTIDE-WEIGHT MANAGEMENT 0.25 MG/0.5ML ~~LOC~~ SOAJ: 0.2500 mg | SUBCUTANEOUS | 0 refills | Status: AC

## 2022-12-07 MED ORDER — SEMAGLUTIDE-WEIGHT MANAGEMENT 1 MG/0.5ML ~~LOC~~ SOAJ: 1.0000 mg | SUBCUTANEOUS | 0 refills | Status: AC

## 2022-12-07 MED ORDER — SEMAGLUTIDE-WEIGHT MANAGEMENT 0.5 MG/0.5ML ~~LOC~~ SOAJ: 0.5000 mg | SUBCUTANEOUS | 0 refills | Status: AC

## 2022-12-07 NOTE — Addendum Note (Signed)
Addended by: Gwenevere Abbot on: 12/07/2022 08:54 PM   Modules accepted: Orders

## 2022-12-08 ENCOUNTER — Ambulatory Visit (INDEPENDENT_AMBULATORY_CARE_PROVIDER_SITE_OTHER): Payer: Medicare HMO | Admitting: Family Medicine

## 2022-12-08 VITALS — BP 118/76 | HR 57 | Ht 69.0 in | Wt 261.0 lb

## 2022-12-08 DIAGNOSIS — M1712 Unilateral primary osteoarthritis, left knee: Secondary | ICD-10-CM

## 2022-12-08 DIAGNOSIS — M5136 Other intervertebral disc degeneration, lumbar region: Secondary | ICD-10-CM | POA: Diagnosis not present

## 2022-12-08 DIAGNOSIS — M51369 Other intervertebral disc degeneration, lumbar region without mention of lumbar back pain or lower extremity pain: Secondary | ICD-10-CM

## 2022-12-08 DIAGNOSIS — M549 Dorsalgia, unspecified: Secondary | ICD-10-CM | POA: Diagnosis not present

## 2022-12-08 MED ORDER — GABAPENTIN 100 MG PO CAPS: 100.0000 mg | ORAL_CAPSULE | Freq: Every day | ORAL | 0 refills | Status: DC
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
## 2022-12-08 NOTE — Patient Instructions (Addendum)
 Imaging [phone] Call Today  Referral to Dr. Earlene Plater Approval gel for knee pain Once you have injection, See you again in 6 weeks

## 2022-12-09 ENCOUNTER — Ambulatory Visit (INDEPENDENT_AMBULATORY_CARE_PROVIDER_SITE_OTHER): Payer: Medicare HMO | Admitting: Medical

## 2022-12-09 ENCOUNTER — Encounter: Payer: Self-pay | Admitting: Family Medicine

## 2022-12-09 ENCOUNTER — Encounter: Payer: Self-pay | Admitting: Medical

## 2022-12-09 ENCOUNTER — Telehealth: Payer: Self-pay | Admitting: *Deleted

## 2022-12-09 VITALS — BP 147/90 | HR 55 | Resp 18 | Ht 69.0 in | Wt 260.0 lb

## 2022-12-09 DIAGNOSIS — Z6835 Body mass index (BMI) 35.0-35.9, adult: Secondary | ICD-10-CM | POA: Diagnosis not present

## 2022-12-09 DIAGNOSIS — G8929 Other chronic pain: Secondary | ICD-10-CM | POA: Diagnosis not present

## 2022-12-09 DIAGNOSIS — M5136 Other intervertebral disc degeneration, lumbar region: Secondary | ICD-10-CM | POA: Insufficient documentation

## 2022-12-09 DIAGNOSIS — M25561 Pain in right knee: Secondary | ICD-10-CM

## 2022-12-09 DIAGNOSIS — M5440 Lumbago with sciatica, unspecified side: Secondary | ICD-10-CM

## 2022-12-09 DIAGNOSIS — M25562 Pain in left knee: Secondary | ICD-10-CM

## 2022-12-09 MED ORDER — HYDROCODONE-ACETAMINOPHEN 5-325 MG PO TABS: 1.0000 | ORAL_TABLET | Freq: Four times a day (QID) | ORAL | 0 refills | Status: DC | PRN

## 2022-12-09 MED ORDER — SILDENAFIL CITRATE 20 MG PO TABS: ORAL_TABLET | ORAL | 0 refills | Status: DC

## 2022-12-09 NOTE — Progress Notes (Signed)
Subjective:    Patient ID: Richard Walker, male    DOB: 05-25-1956, 67 y.o.   MRN: [id number]  HPI  Pt has low back pain. Pt has seen sports med MD. He saw Dr. Antoine Primas.  "MRI of the left knee was done May 2022.  Patient had a complex medial meniscal tear of the posterior horn as well as moderate patellofemoral arthritis.  Reviewing patient's chart and patient has done rehabilitation for his knee previously.   Previous imaging of patient's back includes in February 2022 a CT scan without contrast of the lumbar spine showing progressive degenerative disc disease at L5-S1 with questionable disc fragment and likely an S1 nerve root impingement.  Patient also has what appears to be a interbody fusion at L4-L5 and moderate to severe spinal stenosis at L3-L4."   I don't see the plan on note. Pt states he plans to get epidural back injection thru [facility]. Pt states severe pain throughout the day. Pain sitting 3/10. When changes position pain level is 10.    Pt has hx of gerd. Had egd in the past. Pt states symptoms conrolled with protonix.     Pt currently not wanting gel treatment for knee. He states Dr Jordan Likes across the hall mentioned was option.  Pt explains today needs pain medication for back pain until can get in with specialist to do epidural.    Review of Systems  Constitutional:  Negative for chills, fatigue and fever.  Respiratory:  Negative for cough, chest tightness, shortness of breath and wheezing.   Cardiovascular:  Negative for chest pain and palpitations.  Gastrointestinal:  Negative for abdominal pain, blood in stool and constipation.  Genitourinary:  Negative for dysuria and frequency.  Musculoskeletal:  Positive for back pain. Negative for neck pain.  Skin:  Negative for rash.  Neurological:  Negative for dizziness, weakness, light-headedness and headaches.  Hematological:  Negative for adenopathy. Does not bruise/bleed easily.  Psychiatric/Behavioral:   Negative for behavioral problems and confusion.     Past Medical History:  Diagnosis Date   DDD (degenerative disc disease), lumbar    L3/L4   Diverticulosis    GERD (gastroesophageal reflux disease)    Hyperlipidemia    Low sperm motility    Pre-diabetes    Sleep apnea    uses cpap    SVT (supraventricular tachycardia)      Social History   Socioeconomic History   Marital status: Married    Spouse name: Not on file   Number of children: Not on file   Years of education: Not on file   Highest education level: Associate degree: occupational, Scientist, product/process development, or vocational program  Occupational History   Not on file  Tobacco Use   Smoking status: Former    Packs/day: 1.00    Years: 10.00    Additional pack years: 0.00    Total pack years: 10.00    Types: Cigarettes    Quit date: 1992    Years since quitting: 32.3   Smokeless tobacco: Never   Tobacco comments:    Uses Hookah Pipe occasionally  Vaping Use   Vaping Use: Never used  Substance and Sexual Activity   Alcohol use: No   Drug use: No   Sexual activity: Yes  Other Topics Concern   Not on file  Social History Narrative   Marital Status: Married Engineer, drilling)   Children:  4 (3 Sons/1 Daughter)   Pets:  None    Living Situation: Lives with spouse and  4 children   Origin:  He was born in [place].   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     Alcohol Use:  None   Drug Use:  None   Diet:  Regular   Exercise:  None   Hobbies:  Cards   Social Determinants of Health   Financial Resource Strain: High Risk (11/24/2022)   Overall Financial Resource Strain (CARDIA)    Difficulty of Paying Living Expenses: Very hard  Food Insecurity: Food Insecurity Present (11/24/2022)   Hunger Vital Sign    Worried About Running Out of Food in the Last Year: Often true    Ran Out of Food in the Last Year:  Sometimes true  Transportation Needs: Patient Declined (11/24/2022)   PRAPARE - Transportation    Lack of Transportation (Medical): Patient declined    Lack of Transportation (Non-Medical): Patient declined  Physical Activity: Inactive (12/23/2021)   Exercise Vital Sign    Days of Exercise per Week: 0 days    Minutes of Exercise per Session: 0 min  Stress: No Stress Concern Present (12/23/2021)   [employer] of Occupational Health - Occupational Stress Questionnaire    Feeling of Stress : Not at all  Social Connections: Socially Integrated (11/24/2022)   Social Connection and Isolation Panel [NHANES]    Frequency of Communication with Friends and Family: More than three times a week    Frequency of Social Gatherings with Friends and Family: Twice a week    Attends Religious Services: More than 4 times per year    Active Member of [employer] or Organizations: Yes    Attends Banker Meetings: Patient declined    Marital Status: Married  Catering manager Violence: Not on file    Past Surgical History:  Procedure Laterality Date   AMPUTATION Left 07/15/2018   Procedure: . REPAIR OF LEFT INDEX AND LONG FINGER BY FUSION /  REPAIR OF RING FINGER;  Surgeon: Mack Hook, MD;  Location: [facility] OR;  Service: Orthopedics;  Laterality: Left;   BACK SURGERY     CARDIAC CATHETERIZATION N/A 12/22/2015   Procedure: Left Heart Cath and Coronary Angiography;  Surgeon: Peter M Swaziland, MD;  Location: [facility] INVASIVE CV LAB;  Service: Cardiovascular;  Laterality: N/A;   CARDIAC ELECTROPHYSIOLOGY STUDY AND ABLATION     ELECTROPHYSIOLOGIC STUDY N/A 02/19/2016   Procedure: SVT Ablation;  Surgeon: Marinus Maw, MD;  Location: [facility] INVASIVE CV LAB;  Service: Cardiovascular;  Laterality: N/A;   septal reconstruction  2017   TURBINATE REDUCTION Bilateral 07/2016   WISDOM TOOTH EXTRACTION      Family History  Problem Relation Age of Onset   Hypertension Father    Food Allergy Son    Colon cancer Neg Hx     Stomach cancer Neg Hx    Allergic rhinitis Neg Hx    Angioedema Neg Hx    Asthma Neg Hx    Eczema Neg Hx    Immunodeficiency Neg Hx    Urticaria Neg Hx     Allergies  Allergen Reactions   Dust Mite Extract    Pork-Derived Products Swelling   Statins     Muscle aches   Tree Extract Other (See Comments)    Intolerance     Current Outpatient Medications on File Prior to Visit  Medication Sig Dispense Refill   acetaminophen (TYLENOL) 325 MG tablet Take 2 tablets (650 mg total)  by mouth every 6 (six) hours.     albuterol (PROAIR HFA) 108 (90 Base) MCG/ACT inhaler Inhale 1-2 puffs into the lungs every 6 (six) hours as needed for wheezing or shortness of breath. 24 g 3   azelastine (ASTELIN) 0.1 % nasal spray 1-2 sprays per nostril 2 times daily as needed. 90 mL 3   benzonatate (TESSALON) 100 MG capsule Take 1 capsule (100 mg total) by mouth 3 (three) times daily as needed for cough. 30 capsule 0   Budeson-Glycopyrrol-Formoterol (BREZTRI AEROSPHERE) 160-9-4.8 MCG/ACT AERO Inhale 2 puffs into the lungs in the morning and at bedtime. 5.9 g 5   ezetimibe (ZETIA) 10 MG tablet Take 1 tablet (10 mg total) by mouth daily. 90 tablet 3   famotidine-calcium carbonate-magnesium hydroxide (PEPCID COMPLETE) 10-800-165 MG chewable tablet Chew by mouth.     fenofibrate (TRICOR) 145 MG tablet Take 1 tablet (145 mg total) by mouth daily. 90 tablet 3   flecainide (TAMBOCOR) 150 MG tablet TAKE 1 TABLET(150 MG) BY MOUTH TWICE DAILY 180 tablet 3   gabapentin (NEURONTIN) 100 MG capsule Take 1 capsule (100 mg total) by mouth at bedtime. 90 capsule 0   ibuprofen (ADVIL) 600 MG tablet Take 600 mg by mouth in the morning and at bedtime.     levocetirizine (XYZAL) 5 MG tablet Take 1 tablet (5 mg total) by mouth every evening. 30 tablet 3   Magnesium 250 MG TABS Take 1 tablet by mouth once a week.     metFORMIN (GLUCOPHAGE) 500 MG tablet TAKE 1 TABLET(500 MG) BY MOUTH TWICE DAILY WITH A MEAL 60 tablet 11    metoprolol tartrate (LOPRESSOR) 25 MG tablet Take one tablet by mouth twice a day.  May take one additional tablet by mouth as needed for breakthrough palpitations. 270 tablet 3   montelukast (SINGULAIR) 10 MG tablet TAKE 1 TABLET BY MOUTH 1 TIME AT NIGHT FOR COUGHING OR WHEEZING 90 tablet 3   pantoprazole (PROTONIX) 40 MG tablet Take 1 tablet (40 mg total) by mouth daily. 30 tablet 11   PRESCRIPTION MEDICATION Inhale into the lungs at bedtime. CPAP     Semaglutide-Weight Management 0.25 MG/0.5ML SOAJ Inject 0.25 mg into the skin once a week for 28 days. 2 mL 0   [START ON 01/05/2023] Semaglutide-Weight Management 0.5 MG/0.5ML SOAJ Inject 0.5 mg into the skin once a week for 28 days. 2 mL 0   [START ON 02/03/2023] Semaglutide-Weight Management 1 MG/0.5ML SOAJ Inject 1 mg into the skin once a week for 28 days. 2 mL 0   sildenafil (REVATIO) 20 MG tablet 2-5 tab one hour prior to sex. 40 tablet 0   No current facility-administered medications on file prior to visit.    BP (!) 147/90 Comment: better at home 117/79 yesterday.  Pulse (!) 55   Resp 18   Ht  (1.753 m)   Wt 260 lb (117.9 kg)   SpO2 97%   BMI 38.40 kg/m          Objective:   Physical Exam  General Mental Status- Alert. General Appearance- Not in acute distress.   Skin General: Color- Normal Color. Moisture- Normal Moisture.  Neck Carotid Arteries- Normal color. Moisture- Normal Moisture. No carotid bruits. No JVD.  Chest and Lung Exam Auscultation: Breath Sounds:-Normal.  Cardiovascular Auscultation:Rythm- Regular. Murmurs & Other Heart Sounds:Auscultation of the heart reveals- No Murmurs.  Abdomen Inspection:-Inspeection Normal. Palpation/Percussion:Note:No mass. Palpation and Percussion of the abdomen reveal- Non Tender, Non Distended + BS, no  rebound or guarding.  Neurologic Cranial Nerve exam:- CN III-XII intact(No nystagmus), symmetric smile. Strength:- 5/5 equal and symmetric strength both upper and  lower extremities.   Back- rt side parspinal lumbar tenderness today.  Lower ext- l5-s1 intact today. Pain on stright leg lift. 5/5  strenth lower ext symmetric.        Assessment & Plan:   Patient Instructions  1. Class 2 severe obesity due to excess calories with serious comorbidity and body mass index (BMI) of 35.0 to 35.9 in adult Rx wegovy sent to pharmacy.  2. Pain in both knees, unspecified chronicity Have seen specialist. Can put referral if you want to proceed with the gel tx specialist proposed.   3. Chronic bilateral low back pain with sciatica, sciatica laterality unspecified  Will rx norco 5/325 #20 1 tab every 6 hours prn severe pain. Rx pending appt with interventional radiologist. If delay in seeing specialist may need to on contract and give uds.  4. For gerd continue protonix.  Follow up 10-14 days or sooner if needed   Esperanza Richters, PA-C   Rx refill revatio. On review no nitro use. Pt states cardio rx'd in the past. I refilled as well.

## 2022-12-09 NOTE — Telephone Encounter (Signed)
   Pre-operative Risk Assessment    Patient Name: Richard Walker  DOB: 10/03/1955 MRN: [id number]      Request for Surgical Clearance    Procedure:   LUMBAR EPIDURAL  Date of Surgery:  Clearance TBD                                 Surgeon:  NOT LISTED Surgeon's Group or Practice Name:  [facility] Phone number:  [phone] Fax number:  [phone]   Type of Clearance Requested:   - Medical  - Pharmacy:  Hold Rivaroxaban (Xarelto) x 2 DOSES PRIOR  WRITTEN ON CLEARANCE FORM    Type of Anesthesia:  Local    Additional requests/questions:    [name]   12/09/2022, 4:31 PM

## 2022-12-09 NOTE — Assessment & Plan Note (Signed)
Referral to metabolic medicine to aid in weight loss.

## 2022-12-09 NOTE — Assessment & Plan Note (Signed)
Chronic problem with worsening symptoms.  Discussed with patient that he has failed the steroid injections it seems.  Will see if we can get approval for viscosupplementation.  I do think will be helpful.  Encourage patient to continue to work on weight loss.  Will refer patient to metabolic medicine to see if this will be helpful as well.  Patient wants to avoid any type of surgical intervention if possible.  Follow-up with me again in 6 to 8 weeks

## 2022-12-09 NOTE — Addendum Note (Signed)
Addended by: Maximino Sarin on: 12/09/2022 10:07 AM   Modules accepted: Orders

## 2022-12-09 NOTE — Patient Instructions (Addendum)
1. Class 2 severe obesity due to excess calories with serious comorbidity and body mass index (BMI) of 35.0 to 35.9 in adult Rx wegovy sent to pharmacy.  2. Pain in both knees, unspecified chronicity Have seen specialist. Can put referral if you want to proceed with the gel tx specialist proposed.   3. Chronic bilateral low back pain with sciatica, sciatica laterality unspecified  Will rx norco 5/325 #20 1 tab every 6 hours prn severe pain. Rx pending appt with interventional radiologist. If delay in seeing specialist may need to on contract and give uds.  4. For gerd continue protonix.  Follow up 10-14 days or sooner if needed

## 2022-12-09 NOTE — Assessment & Plan Note (Addendum)
Significant degenerative disc disease with patient CT scan of the lumbar spine showing progressive L5-S1 as well as adjacent segment disease to patient's interbody fusion at L4-L5 where patient is now having the L3-L4 narrowing.  At this point we discussed with patient about home exercises, icing regimen, which activities to do and which ones to avoid.  Patient would like to try an epidural.  And we will order that and see how he does respond.  Concern is also with patient's weight and be referred to help with weight loss.  Follow-up again in 6 weeks after injection, gabapentin was prescribed warned of potential side effects

## 2022-12-10 MED ORDER — RIVAROXABAN 20 MG PO TABS: 20.0000 mg | ORAL_TABLET | Freq: Every day | ORAL | 3 refills | Status: DC

## 2022-12-10 NOTE — Telephone Encounter (Signed)
Patient with diagnosis of A Fib on Xarelto for anticoagulation.    Procedure: LUMBAR EPIDURAL  Date of procedure: TBD   CHA2DS2-VASc Score = 2  This indicates a 2.2% annual risk of stroke. The patient's score is based upon: CHF History: 0 HTN History: 1 Diabetes History: 0 Stroke History: 0 Vascular Disease History: 0 Age Score: 1 Gender Score: 0    CrCl 111 mL/min Platelet count 198K   Per office protocol, patient can hold Xarelto for 3 days prior to procedure.    **This guidance is not considered finalized until pre-operative APP has relayed final recommendations.**

## 2022-12-10 NOTE — Telephone Encounter (Signed)
Called patient to verify that he continues to take Xarelto 20 mg daily. The Rx was removed from his medication list. He verifies that he has this Rx and takes it daily.

## 2022-12-10 NOTE — Telephone Encounter (Signed)
   Primary Cardiologist: None  Chart reviewed as part of pre-operative protocol coverage. Given past medical history and time since last visit, based on ACC/AHA guidelines, Richard Walker would be at acceptable risk for the planned procedure without further cardiovascular testing.   Patient was advised that if he develops new symptoms prior to surgery to contact our office to arrange a follow-up appointment. He verbalized understanding.  Per office protocol, patient can hold Xarelto for 3 days prior to procedure.   I will route this recommendation to the requesting party via Epic fax function and remove from pre-op pool.  Please call with questions.  Levi Aland, NP-C  12/10/2022, 10:09 AM [address], [address] Office [phone] Fax [phone]

## 2022-12-10 NOTE — Addendum Note (Signed)
Addended by: Levi Aland on: 12/10/2022 09:10 AM   Modules accepted: Orders

## 2022-12-13 ENCOUNTER — Telehealth: Payer: Self-pay | Admitting: Medical

## 2022-12-13 NOTE — Telephone Encounter (Signed)
Contacted Kimsey F Payette to schedule their annual wellness visit. Appointment made for 12/29/2022.  Verlee Rossetti; Care Guide Ambulatory Clinical Support [facility] Direct Dial: [phone]

## 2022-12-14 DIAGNOSIS — M5416 Radiculopathy, lumbar region: Secondary | ICD-10-CM | POA: Diagnosis not present

## 2022-12-14 DIAGNOSIS — M5136 Other intervertebral disc degeneration, lumbar region: Secondary | ICD-10-CM | POA: Diagnosis not present

## 2022-12-14 DIAGNOSIS — M1711 Unilateral primary osteoarthritis, right knee: Secondary | ICD-10-CM | POA: Diagnosis not present

## 2022-12-16 ENCOUNTER — Ambulatory Visit
Admission: RE | Admit: 2022-12-16 | Discharge: 2022-12-16 | Disposition: A | Discharge status: Home / Self Care | Payer: Medicare HMO | Source: Ambulatory Visit | Attending: Family Medicine | Admitting: Family Medicine

## 2022-12-16 DIAGNOSIS — M48061 Spinal stenosis, lumbar region without neurogenic claudication: Secondary | ICD-10-CM | POA: Diagnosis not present

## 2022-12-16 DIAGNOSIS — Z981 Arthrodesis status: Secondary | ICD-10-CM | POA: Diagnosis not present

## 2022-12-16 DIAGNOSIS — M549 Dorsalgia, unspecified: Secondary | ICD-10-CM

## 2022-12-16 DIAGNOSIS — M5416 Radiculopathy, lumbar region: Secondary | ICD-10-CM | POA: Diagnosis not present

## 2022-12-16 MED ORDER — IOPAMIDOL (ISOVUE-M 200) INJECTION 41%
1.0000 mL | Freq: Once | INTRAMUSCULAR | Status: AC
  Administered 2022-12-16: 1 mL via EPIDURAL

## 2022-12-16 MED ORDER — METHYLPREDNISOLONE ACETATE 40 MG/ML INJ SUSP (RADIOLOG
80.0000 mg | Freq: Once | INTRAMUSCULAR | Status: AC
  Administered 2022-12-16: 80 mg via EPIDURAL

## 2022-12-16 NOTE — Discharge Instructions (Addendum)
Post Procedure Spinal Discharge Instruction Sheet  You may resume a regular diet and any medications that you routinely take (including pain medications) unless otherwise noted by MD.  No driving day of procedure.  Light activity throughout the rest of the day.  Do not do any strenuous work, exercise, bending or lifting.  The day following the procedure, you can resume normal physical activity but you should refrain from exercising or physical therapy for at least three days thereafter.  You may apply ice to the injection site, 20 minutes on, 20 minutes off, as needed. Do not apply ice directly to skin.    Common Side Effects:  Headaches- take your usual medications as directed by your physician.  Increase your fluid intake.  Caffeinated beverages may be helpful.  Lie flat in bed until your headache resolves.  Restlessness or inability to sleep- you may have trouble sleeping for the next few days.  Ask your referring physician if you need any medication for sleep.  Facial flushing or redness- should subside within a few days.  Increased pain- a temporary increase in pain a day or two following your procedure is not unusual.  Take your pain medication as prescribed by your referring physician.  Leg cramps  Please contact our office at [phone] for the following symptoms: Fever greater than 100 degrees. Headaches unresolved with medication after 2-3 days. Increased swelling, pain, or redness at injection site.   Thank you for visiting [facility] today.   YOU MAY RESUME YOUR XARELTO 24HR POST PROCEDURE TODAY.

## 2022-12-20 ENCOUNTER — Encounter: Payer: Self-pay | Admitting: Medical

## 2022-12-20 NOTE — Telephone Encounter (Signed)
Error

## 2022-12-20 NOTE — Telephone Encounter (Signed)
Could you work on this for me , please

## 2022-12-20 NOTE — Telephone Encounter (Signed)
Pt has Medicare- they will not cover weight loss medications.

## 2022-12-29 ENCOUNTER — Ambulatory Visit (INDEPENDENT_AMBULATORY_CARE_PROVIDER_SITE_OTHER): Payer: Medicare HMO | Admitting: *Deleted

## 2022-12-29 DIAGNOSIS — Z0000 Encounter for general adult medical examination without abnormal findings: Secondary | ICD-10-CM | POA: Diagnosis not present

## 2022-12-29 NOTE — Progress Notes (Signed)
Subjective:   Richard Walker is a 67 y.o. male who presents for Medicare Annual/Subsequent preventive examination.  I connected with  Richard Walker on 12/29/22 by a audio enabled telemedicine application and verified that I am speaking with the correct person using two identifiers.  Patient Location: Home  Provider Location: Office/Clinic  I discussed the limitations of evaluation and management by telemedicine. The patient expressed understanding and agreed to proceed.   Review of Systems     Cardiac Risk Factors include: advanced age (>61men, >55 women);male gender;hypertension;obesity (BMI >30kg/m2)     Objective:    Today's Vitals   12/29/22 1503  PainSc: 7    There is no height or weight on file to calculate BMI.     12/29/2022    3:02 PM 12/23/2021   12:20 PM 01/21/2021    1:32 PM 12/04/2020    9:35 PM 11/05/2020    4:20 PM 10/07/2020   11:31 AM 07/26/2018    9:35 AM  Advanced Directives  Does Patient Have a Medical Advance Directive? No No No No No No No  Would patient like information on creating a medical advance directive? No - Patient declined   No - Patient declined No - Patient declined No - Patient declined     Current Medications (verified) Outpatient Encounter Medications as of 12/29/2022  Medication Sig   acetaminophen (TYLENOL) 325 MG tablet Take 2 tablets (650 mg total) by mouth every 6 (six) hours.   albuterol (PROAIR HFA) 108 (90 Base) MCG/ACT inhaler Inhale 1-2 puffs into the lungs every 6 (six) hours as needed for wheezing or shortness of breath.   azelastine (ASTELIN) 0.1 % nasal spray 1-2 sprays per nostril 2 times daily as needed.   benzonatate (TESSALON) 100 MG capsule Take 1 capsule (100 mg total) by mouth 3 (three) times daily as needed for cough.   Budeson-Glycopyrrol-Formoterol (BREZTRI AEROSPHERE) 160-9-4.8 MCG/ACT AERO Inhale 2 puffs into the lungs in the morning and at bedtime.   ezetimibe (ZETIA) 10 MG tablet Take 1 tablet (10 mg total) by mouth  daily.   famotidine-calcium carbonate-magnesium hydroxide (PEPCID COMPLETE) 10-800-165 MG chewable tablet Chew by mouth.   fenofibrate (TRICOR) 145 MG tablet Take 1 tablet (145 mg total) by mouth daily.   flecainide (TAMBOCOR) 150 MG tablet TAKE 1 TABLET(150 MG) BY MOUTH TWICE DAILY   gabapentin (NEURONTIN) 100 MG capsule Take 1 capsule (100 mg total) by mouth at bedtime.   HYDROcodone-acetaminophen (NORCO) 5-325 MG tablet Take 1 tablet by mouth every 6 (six) hours as needed for moderate pain.   ibuprofen (ADVIL) 600 MG tablet Take 600 mg by mouth in the morning and at bedtime.   levocetirizine (XYZAL) 5 MG tablet Take 1 tablet (5 mg total) by mouth every evening.   Magnesium 250 MG TABS Take 1 tablet by mouth once a week.   metFORMIN (GLUCOPHAGE) 500 MG tablet TAKE 1 TABLET(500 MG) BY MOUTH TWICE DAILY WITH A MEAL   metoprolol tartrate (LOPRESSOR) 25 MG tablet Take one tablet by mouth twice a day.  May take one additional tablet by mouth as needed for breakthrough palpitations.   montelukast (SINGULAIR) 10 MG tablet TAKE 1 TABLET BY MOUTH 1 TIME AT NIGHT FOR COUGHING OR WHEEZING   pantoprazole (PROTONIX) 40 MG tablet Take 1 tablet (40 mg total) by mouth daily.   PRESCRIPTION MEDICATION Inhale into the lungs at bedtime. CPAP   rivaroxaban (XARELTO) 20 MG TABS tablet Take 1 tablet (20 mg total) by mouth  daily with supper.   Semaglutide-Weight Management 0.25 MG/0.5ML SOAJ Inject 0.25 mg into the skin once a week for 28 days.   [START ON 01/05/2023] Semaglutide-Weight Management 0.5 MG/0.5ML SOAJ Inject 0.5 mg into the skin once a week for 28 days.   [START ON 02/03/2023] Semaglutide-Weight Management 1 MG/0.5ML SOAJ Inject 1 mg into the skin once a week for 28 days.   sildenafil (REVATIO) 20 MG tablet 2-5 tab one hour prior to sex.   No facility-administered encounter medications on file as of 12/29/2022.    Allergies (verified) Dust mite extract, Pork-derived products, Statins, and Tree extract    History: Past Medical History:  Diagnosis Date   DDD (degenerative disc disease), lumbar    L3/L4   Diverticulosis    GERD (gastroesophageal reflux disease)    Hyperlipidemia    Low sperm motility    Pre-diabetes    Sleep apnea    uses cpap    SVT (supraventricular tachycardia)    Past Surgical History:  Procedure Laterality Date   AMPUTATION Left 07/15/2018   Procedure: . REPAIR OF LEFT INDEX AND LONG FINGER BY FUSION /  REPAIR OF RING FINGER;  Surgeon: Mack Hook, MD;  Location: [facility] OR;  Service: Orthopedics;  Laterality: Left;   BACK SURGERY     CARDIAC CATHETERIZATION N/A 12/22/2015   Procedure: Left Heart Cath and Coronary Angiography;  Surgeon: Peter M Swaziland, MD;  Location: [facility] INVASIVE CV LAB;  Service: Cardiovascular;  Laterality: N/A;   CARDIAC ELECTROPHYSIOLOGY STUDY AND ABLATION     ELECTROPHYSIOLOGIC STUDY N/A 02/19/2016   Procedure: SVT Ablation;  Surgeon: Marinus Maw, MD;  Location: [facility] INVASIVE CV LAB;  Service: Cardiovascular;  Laterality: N/A;   septal reconstruction  2017   TURBINATE REDUCTION Bilateral 07/2016   WISDOM TOOTH EXTRACTION     Family History  Problem Relation Age of Onset   Hypertension Father    Food Allergy Son    Colon cancer Neg Hx    Stomach cancer Neg Hx    Allergic rhinitis Neg Hx    Angioedema Neg Hx    Asthma Neg Hx    Eczema Neg Hx    Immunodeficiency Neg Hx    Urticaria Neg Hx    Social History   Socioeconomic History   Marital status: Married    Spouse name: Not on file   Number of children: Not on file   Years of education: Not on file   Highest education level: Associate degree: occupational, Scientist, product/process development, or vocational program  Occupational History   Not on file  Tobacco Use   Smoking status: Former    Packs/day: 1.00    Years: 10.00    Additional pack years: 0.00    Total pack years: 10.00    Types: Cigarettes    Quit date: 1992    Years since quitting: 32.3   Smokeless tobacco: Never   Tobacco comments:     Uses Hookah Pipe occasionally  Vaping Use   Vaping Use: Never used  Substance and Sexual Activity   Alcohol use: No   Drug use: No   Sexual activity: Yes  Other Topics Concern   Not on file  Social History Narrative   Marital Status: Married Engineer, drilling)   Children:  4 (3 Sons/1 Daughter)   Pets:  None    Living Situation: Lives with spouse and 4 children   Origin:  He was born in [place].   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     Alcohol Use:  None   Drug Use:  None   Diet:  Regular   Exercise:  None   Hobbies:  Cards   Social Determinants of Health   Financial Resource Strain: High Risk (11/24/2022)   Overall Financial Resource Strain (CARDIA)    Difficulty of Paying Living Expenses: Very hard  Food Insecurity: Food Insecurity Present (11/24/2022)   Hunger Vital Sign    Worried About Running Out of Food in the Last Year: Often true    Ran Out of Food in the Last Year: Sometimes true  Transportation Needs: Patient Declined (11/24/2022)   PRAPARE - Administrator, Civil Service (Medical): Patient declined    Lack of Transportation (Non-Medical): Patient declined  Physical Activity: Inactive (12/23/2021)   Exercise Vital Sign    Days of Exercise per Week: 0 days    Minutes of Exercise per Session: 0 min  Stress: No Stress Concern Present (12/23/2021)   [employer] of Occupational Health - Occupational Stress Questionnaire    Feeling of Stress : Not at all  Social Connections: Socially Integrated (11/24/2022)   Social Connection and Isolation Panel [NHANES]    Frequency of Communication with Friends and Family: More than three times a week    Frequency of Social Gatherings with Friends and Family: Twice a week    Attends Religious Services: More than 4 times per year    Active Member of [employer] or Organizations: Yes    Attends  Banker Meetings: Patient declined    Marital Status: Married    Tobacco Counseling Counseling given: Not Answered Tobacco comments: Uses Hookah Pipe occasionally   Clinical Intake:  Pre-visit preparation completed: Yes  Pain : 0-10 Pain Score: 7  Pain Location: Back Pain Orientation: Lower     Nutritional Risks: None Diabetes: No  How often do you need to have someone help you when you read instructions, pamphlets, or other written materials from your doctor or pharmacy?: 1 - Never  Activities of Daily Living    12/29/2022    3:07 PM  In your present state of health, do you have any difficulty performing the following activities:  Hearing? 1  Comment some slight hearing loss  Vision? 0  Difficulty concentrating or making decisions? 0  Walking or climbing stairs? 1  Dressing or bathing? 0  Doing errands, shopping? 0  Preparing Food and eating ? N  Using the Toilet? N  In the past six months, have you accidently leaked urine? N  Do you have problems with loss of bowel control? N  Managing your Medications? N  Managing your Finances? N  Housekeeping or managing your Housekeeping? N    Patient Care Team: Saguier, Kateri Mc as PCP - General (Internal Medicine) Marinus Maw, MD as PCP - Electrophysiology (Cardiology) Keturah Barre, MD as Consulting Physician (Otolaryngology)  Indicate any recent Medical Services you may have received from other than [facility] providers in the past year (date may be approximate).     Assessment:   This is a routine wellness examination for Rett.  Hearing/Vision screen No results found.  Dietary issues and exercise activities discussed: Current Exercise Habits: The patient does not participate in regular exercise at present, Exercise limited by: orthopedic condition(s)   Goals Addressed   None    Depression Screen    12/29/2022    3:07 PM 02/02/2022    1:47  PM 12/23/2021   12:21 PM 04/15/2021    3:01 PM  12/24/2015   10:28 AM  PHQ 2/9 Scores  PHQ - 2 Score 0 0 1 0 0  Exception Documentation     Other- indicate reason in comment box  Not completed     Patient states life causes his depression at times    Fall Risk    12/29/2022    3:04 PM 02/02/2022    1:47 PM 12/23/2021   12:21 PM 12/24/2015   10:28 AM  Fall Risk   Falls in the past year? 0 0 0 Yes  Number falls in past yr: 0 0 0 1  Injury with Fall? 0 0 0 No  Risk for fall due to : No Fall Risks  Medication side effect   Follow up Falls evaluation completed  Falls evaluation completed;Education provided;Falls prevention discussed Falls prevention discussed    FALL RISK PREVENTION PERTAINING TO THE HOME:  Any stairs in or around the home? Yes  If so, are there any without handrails? No  Home free of loose throw rugs in walkways, pet beds, electrical cords, etc? Yes  Adequate lighting in your home to reduce risk of falls? Yes   ASSISTIVE DEVICES UTILIZED TO PREVENT FALLS:  Life alert? No  Use of a cane, walker or w/c? No  Grab bars in the bathroom? No  Shower chair or bench in shower? No  Elevated toilet seat or a handicapped toilet? No   TIMED UP AND GO:  Was the test performed?  No, audio visit .    Cognitive Function:        12/29/2022    3:12 PM 12/23/2021   12:25 PM  6CIT Screen  What Year? 0 points 0 points  What month? 0 points 0 points  What time? 0 points 0 points  Count back from 20 0 points 0 points  Months in reverse 0 points 0 points  Repeat phrase 2 points 0 points  Total Score 2 points 0 points    Immunizations Immunization History  Administered Date(s) Administered   Fluad Quad(high Dose 65+) 07/29/2021, 09/13/2022   Influenza Split 05/23/2017   Influenza,inj,Quad PF,6+ Mos 09/18/2018, 08/14/2020   Influenza,inj,Quad PF,6-35 Mos 09/18/2018, 08/14/2020   Influenza,inj,quad, With Preservative 05/23/2017   Influenza-Unspecified 05/23/2017, 09/19/2018   PFIZER(Purple Top)SARS-COV-2 Vaccination  10/02/2020, 11/04/2020   PNEUMOCOCCAL CONJUGATE-20 09/13/2022   PPD Test 02/26/2015   Pneumococcal Polysaccharide-23 10/24/2017   Td 09/15/2001   Td (Adult),5 Lf Tetanus Toxid, Preservative Free 09/15/2001   Tdap 06/16/2007, 10/24/2017, 01/21/2021   Zoster Recombinat (Shingrix) 10/15/2020, 03/02/2021   Zoster, Live 04/19/2012    TDAP status: Up to date  Flu Vaccine status: Up to date  Pneumococcal vaccine status: Up to date  Covid-19 vaccine status: Information provided on how to obtain vaccines.   Qualifies for Shingles Vaccine? Yes   Zostavax completed Yes   Shingrix Completed?: Yes  Screening Tests Health Maintenance  Topic Date Due   Hepatitis C Screening  Never done   COVID-19 Vaccine (3 - 2023-24 season) 04/23/2022   COLONOSCOPY (Pts 45-82yrs Insurance coverage will need to be confirmed)  12/05/2022   Medicare Annual Wellness (AWV)  12/24/2022   INFLUENZA VACCINE  03/24/2023   DTaP/Tdap/Td (6 - Td or Tdap) 01/22/2031   Pneumonia Vaccine 31+ Years old  Completed   Zoster Vaccines- Shingrix  Completed   HPV VACCINES  Aged Out    Health Maintenance  Health Maintenance Due  Topic Date Due  Hepatitis C Screening  Never done   COVID-19 Vaccine (3 - 2023-24 season) 04/23/2022   COLONOSCOPY (Pts 45-52yrs Insurance coverage will need to be confirmed)  12/05/2022   Medicare Annual Wellness (AWV)  12/24/2022    Colorectal cancer screening: Type of screening: Colonoscopy. Completed 12/04/12. Repeat every 10 years  Lung Cancer Screening: (Low Dose CT Chest recommended if Age 70-80 years, 30 pack-year currently smoking OR have quit w/in 15years.) does not qualify.   Additional Screening:  Hepatitis C Screening: does qualify; Completed N/a  Vision Screening: Recommended annual ophthalmology exams for early detection of glaucoma and other disorders of the eye. Is the patient up to date with their annual eye exam?  No  Who is the provider or what is the name of the  office in which the patient attends annual eye exams? [facility]. If pt is not established with a provider, would they like to be referred to a provider to establish care? No .   Dental Screening: Recommended annual dental exams for proper oral hygiene  Community Resource Referral / Chronic Care Management: CRR required this visit?  No   CCM required this visit?  No      Plan:     I have personally reviewed and noted the following in the patient's chart:   Medical and social history Use of alcohol, tobacco or illicit drugs  Current medications and supplements including opioid prescriptions. Patient is currently taking opioid prescriptions. Information provided to patient regarding non-opioid alternatives. Patient advised to discuss non-opioid treatment plan with their provider. Functional ability and status Nutritional status Physical activity Advanced directives List of other physicians Hospitalizations, surgeries, and ER visits in previous 12 months Vitals Screenings to include cognitive, depression, and falls Referrals and appointments  In addition, I have reviewed and discussed with patient certain preventive protocols, quality metrics, and best practice recommendations. A written personalized care plan for preventive services as well as general preventive health recommendations were provided to patient.   Due to this being a telephonic visit, the after visit summary with patients personalized plan was offered to patient via mail or my-chart. Patient would like to access on my-chart.   Donne Anon, [place]   12/29/2022   Nurse Notes: None

## 2022-12-29 NOTE — Patient Instructions (Signed)
Richard Walker , Thank you for taking time to come for your Medicare Wellness Visit. I appreciate your ongoing commitment to your health goals. Please review the following plan we discussed and let me know if I can assist you in the future.     This is a list of the screening recommended for you and due dates:  Health Maintenance  Topic Date Due   Hepatitis C Screening: USPSTF Recommendation to screen - Ages 64-67 yo.  Never done   COVID-19 Vaccine (3 - 2023-24 season) 04/23/2022   Colon Cancer Screening  12/05/2022   Flu Shot  03/24/2023   Medicare Annual Wellness Visit  12/29/2023   DTaP/Tdap/Td vaccine (6 - Td or Tdap) 01/22/2031   Pneumonia Vaccine  Completed   Zoster (Shingles) Vaccine  Completed   HPV Vaccine  Aged Out    Next appointment: Follow up in one year for your annual wellness visit.   Preventive Care 2 Years and Older, Male Preventive care refers to lifestyle choices and visits with your health care provider that can promote health and wellness. What does preventive care include? A yearly physical exam. This is also called an annual well check. Dental exams once or twice a year. Routine eye exams. Ask your health care provider how often you should have your eyes checked. Personal lifestyle choices, including: Daily care of your teeth and gums. Regular physical activity. Eating a healthy diet. Avoiding tobacco and drug use. Limiting alcohol use. Practicing safe sex. Taking low doses of aspirin every day. Taking vitamin and mineral supplements as recommended by your health care provider. What happens during an annual well check? The services and screenings done by your health care provider during your annual well check will depend on your age, overall health, lifestyle risk factors, and family history of disease. Counseling  Your health care provider may ask you questions about your: Alcohol use. Tobacco use. Drug use. Emotional well-being. Home and  relationship well-being. Sexual activity. Eating habits. History of falls. Memory and ability to understand (cognition). Work and work Astronomer. Screening  You may have the following tests or measurements: Height, weight, and BMI. Blood pressure. Lipid and cholesterol levels. These may be checked every 5 years, or more frequently if you are over 18 years old. Skin check. Lung cancer screening. You may have this screening every year starting at age 70 if you have a 30-pack-year history of smoking and currently smoke or have quit within the past 15 years. Fecal occult blood test (FOBT) of the stool. You may have this test every year starting at age 23. Flexible sigmoidoscopy or colonoscopy. You may have a sigmoidoscopy every 5 years or a colonoscopy every 10 years starting at age 64. Prostate cancer screening. Recommendations will vary depending on your family history and other risks. Hepatitis C blood test. Hepatitis B blood test. Sexually transmitted disease (STD) testing. Diabetes screening. This is done by checking your blood sugar (glucose) after you have not eaten for a while (fasting). You may have this done every 1-3 years. Abdominal aortic aneurysm (AAA) screening. You may need this if you are a current or former smoker. Osteoporosis. You may be screened starting at age 53 if you are at high risk. Talk with your health care provider about your test results, treatment options, and if necessary, the need for more tests. Vaccines  Your health care provider may recommend certain vaccines, such as: Influenza vaccine. This is recommended every year. Tetanus, diphtheria, and acellular pertussis (Tdap, Td) vaccine.  You may need a Td booster every 10 years. Zoster vaccine. You may need this after age 18. Pneumococcal 13-valent conjugate (PCV13) vaccine. One dose is recommended after age 66. Pneumococcal polysaccharide (PPSV23) vaccine. One dose is recommended after age 12. Talk to your  health care provider about which screenings and vaccines you need and how often you need them. This information is not intended to replace advice given to you by your health care provider. Make sure you discuss any questions you have with your health care provider. Document Released: 09/05/2015 Document Revised: 04/28/2016 Document Reviewed: 06/10/2015 Elsevier Interactive Patient Education  2017 [place] Prevention in the Home Falls can cause injuries. They can happen to people of all ages. There are many things you can do to make your home safe and to help prevent falls. What can I do on the outside of my home? Regularly fix the edges of walkways and driveways and fix any cracks. Remove anything that might make you trip as you walk through a door, such as a raised step or threshold. Trim any bushes or trees on the path to your home. Use bright outdoor lighting. Clear any walking paths of anything that might make someone trip, such as rocks or tools. Regularly check to see if handrails are loose or broken. Make sure that both sides of any steps have handrails. Any raised decks and porches should have guardrails on the edges. Have any leaves, snow, or ice cleared regularly. Use sand or salt on walking paths during winter. Clean up any spills in your garage right away. This includes oil or grease spills. What can I do in the bathroom? Use night lights. Install grab bars by the toilet and in the tub and shower. Do not use towel bars as grab bars. Use non-skid mats or decals in the tub or shower. If you need to sit down in the shower, use a plastic, non-slip stool. Keep the floor dry. Clean up any water that spills on the floor as soon as it happens. Remove soap buildup in the tub or shower regularly. Attach bath mats securely with double-sided non-slip rug tape. Do not have throw rugs and other things on the floor that can make you trip. What can I do in the bedroom? Use night  lights. Make sure that you have a light by your bed that is easy to reach. Do not use any sheets or blankets that are too big for your bed. They should not hang down onto the floor. Have a firm chair that has side arms. You can use this for support while you get dressed. Do not have throw rugs and other things on the floor that can make you trip. What can I do in the kitchen? Clean up any spills right away. Avoid walking on wet floors. Keep items that you use a lot in easy-to-reach places. If you need to reach something above you, use a strong step stool that has a grab bar. Keep electrical cords out of the way. Do not use floor polish or wax that makes floors slippery. If you must use wax, use non-skid floor wax. Do not have throw rugs and other things on the floor that can make you trip. What can I do with my stairs? Do not leave any items on the stairs. Make sure that there are handrails on both sides of the stairs and use them. Fix handrails that are broken or loose. Make sure that handrails are as long as  the stairways. Check any carpeting to make sure that it is firmly attached to the stairs. Fix any carpet that is loose or worn. Avoid having throw rugs at the top or bottom of the stairs. If you do have throw rugs, attach them to the floor with carpet tape. Make sure that you have a light switch at the top of the stairs and the bottom of the stairs. If you do not have them, ask someone to add them for you. What else can I do to help prevent falls? Wear shoes that: Do not have high heels. Have rubber bottoms. Are comfortable and fit you well. Are closed at the toe. Do not wear sandals. If you use a stepladder: Make sure that it is fully opened. Do not climb a closed stepladder. Make sure that both sides of the stepladder are locked into place. Ask someone to hold it for you, if possible. Clearly mark and make sure that you can see: Any grab bars or handrails. First and last  steps. Where the edge of each step is. Use tools that help you move around (mobility aids) if they are needed. These include: Canes. Walkers. Scooters. Crutches. Turn on the lights when you go into a dark area. Replace any light bulbs as soon as they burn out. Set up your furniture so you have a clear path. Avoid moving your furniture around. If any of your floors are uneven, fix them. If there are any pets around you, be aware of where they are. Review your medicines with your doctor. Some medicines can make you feel dizzy. This can increase your chance of falling. Ask your doctor what other things that you can do to help prevent falls. This information is not intended to replace advice given to you by your health care provider. Make sure you discuss any questions you have with your health care provider. Document Released: 06/05/2009 Document Revised: 01/15/2016 Document Reviewed: 09/13/2014 Elsevier Interactive Patient Education  2017 [employer].

## 2023-01-25 ENCOUNTER — Encounter (HOSPITAL_BASED_OUTPATIENT_CLINIC_OR_DEPARTMENT_OTHER): Payer: Medicaid Other

## 2023-01-27 NOTE — Progress Notes (Unsigned)
[facility] [address] [place] [address] Phone: [phone] Subjective:   Richard Walker, am serving as a scribe for Dr. Antoine Primas.  I'm seeing this patient by the request  of:  Saguier, Kateri Mc  CC: Low back pain follow-up  [facility]:[id number]  12/08/2022 Chronic problem with worsening symptoms.  Discussed with patient that he has failed the steroid injections it seems.  Will see if we can get approval for viscosupplementation.  I do think will be helpful.  Encourage patient to continue to work on weight loss.  Will refer patient to metabolic medicine to see if this will be helpful as well.  Patient wants to avoid any type of surgical intervention if possible.  Follow-up with me again in 6 to 8 weeks      Update 02/01/2023 Richard Walker is a 67 y.o. male coming in with complaint of R knee and LBP. Epidural 12/16/2022.  Found to have degenerative disc disease mostly at the L3-L4 area.  Patient states epidural helped a lot. Still some pain with certain movements. Both knees are getting worse. Left is worse than Right. Pain is going above and below knee. Feels a burning sensation at night in thigh sometimes. Has been taking gabapentin. Needs something stronger. Wears knee braces but feels like it cuts off circulation. Causes more pain.      Past Medical History:  Diagnosis Date   DDD (degenerative disc disease), lumbar    L3/L4   Diverticulosis    GERD (gastroesophageal reflux disease)    Hyperlipidemia    Low sperm motility    Pre-diabetes    Sleep apnea    uses cpap    SVT (supraventricular tachycardia)    Past Surgical History:  Procedure Laterality Date   AMPUTATION Left 07/15/2018   Procedure: . REPAIR OF LEFT INDEX AND LONG FINGER BY FUSION /  REPAIR OF RING FINGER;  Surgeon: Mack Hook, MD;  Location: [facility] OR;  Service: Orthopedics;  Laterality: Left;   BACK SURGERY     CARDIAC CATHETERIZATION N/A 12/22/2015   Procedure: Left  Heart Cath and Coronary Angiography;  Surgeon: Peter M Swaziland, MD;  Location: [facility] INVASIVE CV LAB;  Service: Cardiovascular;  Laterality: N/A;   CARDIAC ELECTROPHYSIOLOGY STUDY AND ABLATION     ELECTROPHYSIOLOGIC STUDY N/A 02/19/2016   Procedure: SVT Ablation;  Surgeon: Marinus Maw, MD;  Location: [facility] INVASIVE CV LAB;  Service: Cardiovascular;  Laterality: N/A;   septal reconstruction  2017   TURBINATE REDUCTION Bilateral 07/2016   WISDOM TOOTH EXTRACTION     Social History   Socioeconomic History   Marital status: Married    Spouse name: Not on file   Number of children: Not on file   Years of education: Not on file   Highest education level: Associate degree: occupational, Scientist, product/process development, or vocational program  Occupational History   Not on file  Tobacco Use   Smoking status: Former    Packs/day: 1.00    Years: 10.00    Additional pack years: 0.00    Total pack years: 10.00    Types: Cigarettes    Quit date: 1992    Years since quitting: 32.4   Smokeless tobacco: Never   Tobacco comments:    Uses Hookah Pipe occasionally  Vaping Use   Vaping Use: Never used  Substance and Sexual Activity   Alcohol use: No   Drug use: No   Sexual activity: Yes  Other Topics Concern  Not on file  Social History Narrative   Marital Status: Married Engineer, drilling)   Children:  4 (3 Sons/1 Daughter)   Pets:  None    Living Situation: Lives with spouse and 4 children   Origin:  He was born in [place].   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     Alcohol Use:  None   Drug Use:  None   Diet:  Regular   Exercise:  None   Hobbies:  Cards   Social Determinants of Health   Financial Resource Strain: High Risk (11/24/2022)   Overall Financial Resource Strain (CARDIA)    Difficulty of Paying Living Expenses: Very hard  Food Insecurity: Food Insecurity Present  (11/24/2022)   Hunger Vital Sign    Worried About Running Out of Food in the Last Year: Often true    Ran Out of Food in the Last Year: Sometimes true  Transportation Needs: Patient Declined (11/24/2022)   PRAPARE - Transportation    Lack of Transportation (Medical): Patient declined    Lack of Transportation (Non-Medical): Patient declined  Physical Activity: Inactive (12/23/2021)   Exercise Vital Sign    Days of Exercise per Week: 0 days    Minutes of Exercise per Session: 0 min  Stress: No Stress Concern Present (12/23/2021)   [employer] of Occupational Health - Occupational Stress Questionnaire    Feeling of Stress : Not at all  Social Connections: Socially Integrated (11/24/2022)   Social Connection and Isolation Panel [NHANES]    Frequency of Communication with Friends and Family: More than three times a week    Frequency of Social Gatherings with Friends and Family: Twice a week    Attends Religious Services: More than 4 times per year    Active Member of [employer] or Organizations: Yes    Attends Banker Meetings: Patient declined    Marital Status: Married   Allergies  Allergen Reactions   Dust Mite Extract    Pork-Derived Products Swelling   Statins     Muscle aches   Tree Extract Other (See Comments)    Intolerance    Family History  Problem Relation Age of Onset   Hypertension Father    Food Allergy Son    Colon cancer Neg Hx    Stomach cancer Neg Hx    Allergic rhinitis Neg Hx    Angioedema Neg Hx    Asthma Neg Hx    Eczema Neg Hx    Immunodeficiency Neg Hx    Urticaria Neg Hx     Current Outpatient Medications (Endocrine & Metabolic):    metFORMIN (GLUCOPHAGE) 500 MG tablet, TAKE 1 TABLET(500 MG) BY MOUTH TWICE DAILY WITH A MEAL  Current Outpatient Medications (Cardiovascular):    ezetimibe (ZETIA) 10 MG tablet, Take 1 tablet (10 mg total) by mouth daily.   fenofibrate (TRICOR) 145 MG tablet, Take 1 tablet (145 mg total) by mouth daily.    flecainide (TAMBOCOR) 150 MG tablet, TAKE 1 TABLET(150 MG) BY MOUTH TWICE DAILY   metoprolol tartrate (LOPRESSOR) 25 MG tablet, Take one tablet by mouth twice a day.  May take one additional tablet by mouth as needed for breakthrough palpitations.   sildenafil (REVATIO) 20 MG tablet, 2-5 tab one hour prior to sex.  Current Outpatient Medications (Respiratory):    albuterol (PROAIR HFA) 108 (90 Base) MCG/ACT inhaler, Inhale 1-2 puffs into the lungs every 6 (  six) hours as needed for wheezing or shortness of breath.   azelastine (ASTELIN) 0.1 % nasal spray, 1-2 sprays per nostril 2 times daily as needed.   benzonatate (TESSALON) 100 MG capsule, Take 1 capsule (100 mg total) by mouth 3 (three) times daily as needed for cough.   Budeson-Glycopyrrol-Formoterol (BREZTRI AEROSPHERE) 160-9-4.8 MCG/ACT AERO, Inhale 2 puffs into the lungs in the morning and at bedtime.   levocetirizine (XYZAL) 5 MG tablet, Take 1 tablet (5 mg total) by mouth every evening.   montelukast (SINGULAIR) 10 MG tablet, TAKE 1 TABLET BY MOUTH 1 TIME AT NIGHT FOR COUGHING OR WHEEZING  Current Outpatient Medications (Analgesics):    acetaminophen (TYLENOL) 325 MG tablet, Take 2 tablets (650 mg total) by mouth every 6 (six) hours.   HYDROcodone-acetaminophen (NORCO) 5-325 MG tablet, Take 1 tablet by mouth every 6 (six) hours as needed for moderate pain.   ibuprofen (ADVIL) 600 MG tablet, Take 600 mg by mouth in the morning and at bedtime.  Current Outpatient Medications (Hematological):    rivaroxaban (XARELTO) 20 MG TABS tablet, Take 1 tablet (20 mg total) by mouth daily with supper.  Current Outpatient Medications (Other):    famotidine-calcium carbonate-magnesium hydroxide (PEPCID COMPLETE) 10-800-165 MG chewable tablet, Chew by mouth.   gabapentin (NEURONTIN) 100 MG capsule, Take 1 capsule (100 mg total) by mouth at bedtime.   Magnesium 250 MG TABS, Take 1 tablet by mouth once a week.   pantoprazole (PROTONIX) 40 MG tablet,  Take 1 tablet (40 mg total) by mouth daily.   PRESCRIPTION MEDICATION, Inhale into the lungs at bedtime. CPAP   Semaglutide-Weight Management 0.5 MG/0.5ML SOAJ, Inject 0.5 mg into the skin once a week for 28 days.   [START ON 02/03/2023] Semaglutide-Weight Management 1 MG/0.5ML SOAJ, Inject 1 mg into the skin once a week for 28 days.   Reviewed prior external information including notes and imaging from  primary care provider As well as notes that were available from care everywhere and other healthcare systems.  Past medical history, social, surgical and family history all reviewed in electronic medical record.  No pertanent information unless stated regarding to the chief complaint.   Review of Systems:  No headache, visual changes, nausea, vomiting, diarrhea, constipation, dizziness, abdominal pain, skin rash, fevers, chills, night sweats, weight loss, swollen lymph nodes, body aches, chest pain, shortness of breath, mood changes. POSITIVE muscle aches, joint swelling  Objective  Blood pressure 126/82, pulse 62, height 5\' 9"  (1.753 m), weight 263 lb (119.3 kg), SpO2 98 %.   General: No apparent distress alert and oriented x3 mood and affect normal, dressed appropriately.  HEENT: Pupils equal, extraocular movements intact  Respiratory: Patient's speak in full sentences and does not appear short of breath  Cardiovascular: No lower extremity edema, non tender, no erythema   Low back exam shows does have some loss of doses.  Patient is wearing a back brace at the moment.  Left knee does have effusion noted.  Patient does have crepitus noted.  Instability noted with valgus and varus force.    Impression and Recommendations:     The above documentation has been reviewed and is accurate and complete Judi Saa, DO

## 2023-02-01 ENCOUNTER — Ambulatory Visit (INDEPENDENT_AMBULATORY_CARE_PROVIDER_SITE_OTHER): Payer: Medicare HMO | Admitting: Family Medicine

## 2023-02-01 ENCOUNTER — Ambulatory Visit (INDEPENDENT_AMBULATORY_CARE_PROVIDER_SITE_OTHER): Payer: Medicare HMO

## 2023-02-01 VITALS — BP 126/82 | HR 62 | Ht 69.0 in | Wt 263.0 lb

## 2023-02-01 DIAGNOSIS — M25561 Pain in right knee: Secondary | ICD-10-CM

## 2023-02-01 DIAGNOSIS — M1712 Unilateral primary osteoarthritis, left knee: Secondary | ICD-10-CM

## 2023-02-01 DIAGNOSIS — G8929 Other chronic pain: Secondary | ICD-10-CM

## 2023-02-01 DIAGNOSIS — M5136 Other intervertebral disc degeneration, lumbar region: Secondary | ICD-10-CM

## 2023-02-01 DIAGNOSIS — M170 Bilateral primary osteoarthritis of knee: Secondary | ICD-10-CM | POA: Diagnosis not present

## 2023-02-01 NOTE — Assessment & Plan Note (Signed)
Patient has made some improvement overall after the epidural.  With patient being on the blood thinner secondary to his atrial fibrillation it does decrease though the different medications he can take.  Patient states that he did do very well with hydrocodone and encouraged him to talk to pain management if this is something he would like to consider.  Follow-up again in 6 to 8 weeks

## 2023-02-01 NOTE — Assessment & Plan Note (Signed)
Encourage patient to continue to work on weight loss.  Would like the ideal goal of BMI under 35 at least.  Patient will start to work on this and will be referred to the medical weight management.

## 2023-02-01 NOTE — Assessment & Plan Note (Signed)
Patient has a primary arthritis and has failed steroid injections.  BMI is under 40 and could have a replacement but patient does do a lot of praying on his knees and has been told that he would not be able to do this if he got the replacement.  He wants to avoid it significantly.  Will try to see if we can get approval for viscosupplementation and then see how patient responds.  Attempted a medial unloader brace noted today to see if this will be helpful.  Follow-up with me again in 6 weeks otherwise.

## 2023-02-01 NOTE — Patient Instructions (Addendum)
Xrays today  Referrals ordered You have 14 days to return or exchange your brace Call [phone], then return the brace to our office  Gel approval See you again in 6 weeks

## 2023-02-21 ENCOUNTER — Other Ambulatory Visit: Payer: Self-pay | Admitting: Family Medicine

## 2023-02-21 ENCOUNTER — Other Ambulatory Visit: Payer: Self-pay | Admitting: Medical

## 2023-03-03 ENCOUNTER — Encounter (HOSPITAL_BASED_OUTPATIENT_CLINIC_OR_DEPARTMENT_OTHER): Payer: Medicaid Other

## 2023-03-10 ENCOUNTER — Ambulatory Visit: Payer: Medicare HMO | Admitting: Medical

## 2023-03-10 ENCOUNTER — Encounter: Payer: Self-pay | Admitting: Medical

## 2023-03-10 VITALS — BP 130/70 | HR 56 | Temp 98.0°F | Resp 18 | Ht 69.0 in | Wt 260.0 lb

## 2023-03-10 DIAGNOSIS — E785 Hyperlipidemia, unspecified: Secondary | ICD-10-CM | POA: Diagnosis not present

## 2023-03-10 DIAGNOSIS — I10 Essential (primary) hypertension: Secondary | ICD-10-CM

## 2023-03-10 DIAGNOSIS — M25562 Pain in left knee: Secondary | ICD-10-CM | POA: Diagnosis not present

## 2023-03-10 DIAGNOSIS — M25561 Pain in right knee: Secondary | ICD-10-CM

## 2023-03-10 DIAGNOSIS — R739 Hyperglycemia, unspecified: Secondary | ICD-10-CM

## 2023-03-10 DIAGNOSIS — R5383 Other fatigue: Secondary | ICD-10-CM | POA: Diagnosis not present

## 2023-03-10 DIAGNOSIS — Z6835 Body mass index (BMI) 35.0-35.9, adult: Secondary | ICD-10-CM | POA: Diagnosis not present

## 2023-03-10 DIAGNOSIS — E559 Vitamin D deficiency, unspecified: Secondary | ICD-10-CM

## 2023-03-10 LAB — VITAMIN D 25 HYDROXY (VIT D DEFICIENCY, FRACTURES): VITD: 24.68 ng/mL — ABNORMAL LOW (ref 30.00–100.00)

## 2023-03-10 LAB — COMPREHENSIVE METABOLIC PANEL
ALT: 16 U/L (ref 0–53)
AST: 14 U/L (ref 0–37)
Albumin: 4.1 g/dL (ref 3.5–5.2)
Alkaline Phosphatase: 69 U/L (ref 39–117)
BUN: 17 mg/dL (ref 6–23)
CO2: 30 mEq/L (ref 19–32)
Calcium: 9.7 mg/dL (ref 8.4–10.5)
Chloride: 101 mEq/L (ref 96–112)
Creatinine, Ser: 0.96 mg/dL (ref 0.40–1.50)
GFR: 82.24 mL/min (ref >60.00)
Glucose, Bld: 99 mg/dL (ref 70–99)
Potassium: 4.2 mEq/L (ref 3.5–5.1)
Sodium: 138 mEq/L (ref 135–145)
Total Bilirubin: 0.7 mg/dL (ref 0.2–1.2)
Total Protein: 6.7 g/dL (ref 6.0–8.3)

## 2023-03-10 LAB — HEMOGLOBIN A1C: Hgb A1c MFr Bld: 5.9 % (ref 4.6–6.5)

## 2023-03-10 LAB — CBC WITH DIFFERENTIAL/PLATELET
Basophils Absolute: 0 10*3/uL (ref 0.0–0.1)
Basophils Relative: 0.5 % (ref 0.0–3.0)
Eosinophils Absolute: 0.1 10*3/uL (ref 0.0–0.7)
Eosinophils Relative: 1.9 % (ref 0.0–5.0)
HCT: 48.6 % (ref 39.0–52.0)
Hemoglobin: 15.9 g/dL (ref 13.0–17.0)
Lymphocytes Relative: 25.9 % (ref 12.0–46.0)
Lymphs Abs: 2 10*3/uL (ref 0.7–4.0)
MCHC: 32.7 g/dL (ref 30.0–36.0)
MCV: 84.7 fl (ref 78.0–100.0)
Monocytes Absolute: 0.5 10*3/uL (ref 0.1–1.0)
Monocytes Relative: 6.6 % (ref 3.0–12.0)
Neutro Abs: 5 10*3/uL (ref 1.4–7.7)
Neutrophils Relative %: 65.1 % (ref 43.0–77.0)
Platelets: 205 10*3/uL (ref 150.0–400.0)
RBC: 5.74 Mil/uL (ref 4.22–5.81)
RDW: 14.4 % (ref 11.5–15.5)
WBC: 7.6 10*3/uL (ref 4.0–10.5)

## 2023-03-10 LAB — LIPID PANEL
Cholesterol: 242 mg/dL — ABNORMAL HIGH (ref 0–200)
HDL: 37.5 mg/dL — ABNORMAL LOW (ref >39.00)
NonHDL: 204.04
Total CHOL/HDL Ratio: 6
Triglycerides: 280 mg/dL — ABNORMAL HIGH (ref 0.0–149.0)
VLDL: 56 mg/dL — ABNORMAL HIGH (ref 0.0–40.0)

## 2023-03-10 LAB — TSH: TSH: 2.04 u[IU]/mL (ref 0.35–5.50)

## 2023-03-10 LAB — VITAMIN B12: Vitamin B-12: 606 pg/mL (ref 211–911)

## 2023-03-10 LAB — T4, FREE: Free T4: 0.91 ng/dL (ref 0.60–1.60)

## 2023-03-10 LAB — LDL CHOLESTEROL, DIRECT: Direct LDL: 150 mg/dL

## 2023-03-10 MED ORDER — ALBUTEROL SULFATE HFA 108 (90 BASE) MCG/ACT IN AERS: 1.0000 | INHALATION_SPRAY | Freq: Four times a day (QID) | RESPIRATORY_TRACT | 3 refills | Status: DC | PRN

## 2023-03-10 MED ORDER — VITAMIN D (ERGOCALCIFEROL) 1.25 MG (50000 UNIT) PO CAPS: 50000.0000 [IU] | ORAL_CAPSULE | ORAL | 3 refills | Status: DC

## 2023-03-10 NOTE — Patient Instructions (Addendum)
1. Class 2 severe obesity due to excess calories with serious comorbidity and body mass index (BMI) of 35.0 to 35.9 in adult [facility]) Unfortunately we will go the was not covered by your insurance.  Did talk to the pharmacist at the med center and he told me at today that he has not found a Medicare plan that does cover [phone].  Will try to get names of local weight loss clinic standpoint prescribe medication more economically.  2. Pain in both knees, unspecified chronicity Continue to follow up with specialist for injections.  3. Hypertension, unspecified type Blood pressure well-controlled today.  Continue current cardiac medications and follow-up with cardiologist. - Comp Met (CMET)  4. Hyperlipidemia, unspecified hyperlipidemia type Will check lipid panel today.  Continue fenofibrate. - Lipid panel  5. Vitamin D deficiency Vitamin D refilled today and will check vitamin D level. - VITAMIN D 25 Hydroxy (Vit-D Deficiency, Fractures)  6. Other fatigue For fatigue follow-up due to below labs.  Particularly if your B12 is low as that has been borderline low in the past. - CBC w/Diff - B12 - Vitamin B1 - TSH - T4, free  7. Elevated blood sugar - Hemoglobin A1c.  If all lab review you are not in the diabetic range then we will prescribe Ozempic.  Continue metformin presently.  Follow-up date to be determined after lab review.

## 2023-03-10 NOTE — Progress Notes (Signed)
   Subjective:    Patient ID: Richard Walker, male    DOB: 1955/11/16, 67 y.o.   MRN: [id number]  HPI  Pt in for follow up.  He is traveling to [place] in early August. Will be there for about 6 weeks.  Pt has back pain. Pt has been seen by sports medicine. Sports medicine has mentioned his weight is major significant factor. Pt states epidural injection did help.   Pt is obese. I had rx'd wegovy and insurance has declined. Pt expresses that he thinks he really needs to loose weight.  He feels his knees pain would get better with weight loss. He is about to get knee injections.    Low vit D.  Needs refill of vit D.  Elevated sugar- last A1c- 5.8.      Review of Systems See hpi.    Objective:   Physical Exam General Mental Status- Alert. General Appearance- Not in acute distress.   Skin General: Color- Normal Color. Moisture- Normal Moisture.  Neck Carotid Arteries- Normal color. Moisture- Normal Moisture. No carotid bruits. No JVD.  Chest and Lung Exam Auscultation: Breath Sounds:-Normal.  Cardiovascular Auscultation:Rythm- Regular. Murmurs & Other Heart Sounds:Auscultation of the heart reveals- No Murmurs.  Abdomen Inspection:-Inspeection Normal. Palpation/Percussion:Note:No mass. Palpation and Percussion of the abdomen reveal- Non Tender, Non Distended + BS, no rebound or guarding.   Neurologic Cranial Nerve exam:- CN III-XII intact(No nystagmus), symmetric smile. Drift Test:- No drift. Romberg Exam:- Negative.  Heal to Toe Gait exam:-Normal. Finger to Nose:- Normal/Intact Strength:- 5/5 equal and symmetric strength both upper and lower extremities.        Assessment & Plan:   Patient Instructions  1. Class 2 severe obesity due to excess calories with serious comorbidity and body mass index (BMI) of 35.0 to 35.9 in adult [facility]) Unfortunately we will go the was not covered by your insurance.  Did talk to the pharmacist at the med center and he told me  at today that he has not found a Medicare plan that does cover [phone].  Will try to get names of local weight loss clinic standpoint prescribe medication more economically.  2. Pain in both knees, unspecified chronicity Continue to follow up with specialist for injections.  3. Hypertension, unspecified type Blood pressure well-controlled today.  Continue current cardiac medications and follow-up with cardiologist. - Comp Met (CMET)  4. Hyperlipidemia, unspecified hyperlipidemia type Will check lipid panel today.  Continue fenofibrate. - Lipid panel  5. Vitamin D deficiency Vitamin D refilled today and will check vitamin D level. - VITAMIN D 25 Hydroxy (Vit-D Deficiency, Fractures)  6. Other fatigue For fatigue follow-up due to below labs.  Particularly if your B12 is low as that has been borderline low in the past. - CBC w/Diff - B12 - Vitamin B1 - TSH - T4, free  7. Elevated blood sugar - Hemoglobin A1c.  If all lab review you are not in the diabetic range then we will prescribe Ozempic.  Continue metformin presently.  Follow-up date to be determined after lab review.   Esperanza Richters, PA-C    Time spent with patient today was  40 minutes which consisted of chart revdiew, discussing diagnosis, work up treatment and documentation.

## 2023-03-11 MED ORDER — WEGOVY 0.25 MG/0.5ML ~~LOC~~ SOAJ: SUBCUTANEOUS | 0 refills | Status: DC

## 2023-03-11 NOTE — Addendum Note (Signed)
Addended by: Gwenevere Abbot on: 03/11/2023 03:56 PM   Modules accepted: Orders

## 2023-03-14 LAB — VITAMIN B1: Vitamin B1 (Thiamine): 14 nmol/L (ref 8–30)

## 2023-03-15 ENCOUNTER — Ambulatory Visit: Payer: Medicare HMO | Admitting: Student in an Organized Health Care Education/Training Program

## 2023-03-15 DIAGNOSIS — M5136 Other intervertebral disc degeneration, lumbar region: Secondary | ICD-10-CM | POA: Diagnosis not present

## 2023-03-15 DIAGNOSIS — M170 Bilateral primary osteoarthritis of knee: Secondary | ICD-10-CM | POA: Diagnosis not present

## 2023-03-15 DIAGNOSIS — M1711 Unilateral primary osteoarthritis, right knee: Secondary | ICD-10-CM | POA: Diagnosis not present

## 2023-03-15 DIAGNOSIS — M1712 Unilateral primary osteoarthritis, left knee: Secondary | ICD-10-CM | POA: Diagnosis not present

## 2023-03-16 NOTE — Progress Notes (Unsigned)
[facility] [address] [place] [address] Phone: [phone] Subjective:   Richard Walker, am serving as a scribe for Dr. Antoine Primas.  I'm seeing this patient by the request  of:  Saguier, Kateri Mc  CC: bilateral knee pain   XBM:[id number]  02/01/2023 Patient has made some improvement overall after the epidural.  With patient being on the blood thinner secondary to his atrial fibrillation it does decrease though the different medications he can take.  Patient states that he did do very well with hydrocodone and encouraged him to talk to pain management if this is something he would like to consider.  Follow-up again in 6 to 8 weeks     Patient has a primary arthritis and has failed steroid injections.  BMI is under 40 and could have a replacement but patient does do a lot of praying on his knees and has been told that he would not be able to do this if he got the replacement.  He wants to avoid it significantly.  Will try to see if we can get approval for viscosupplementation and then see how patient responds.  Attempted a medial unloader brace noted today to see if this will be helpful.  Follow-up with me again in 6 weeks otherwise.      Encourage patient to continue to work on weight loss. Would like the ideal goal of BMI under 35 at least. Patient will start to work on this and will be referred to the medical weight management.   Update 03/17/2023 Richard Walker is a 67 y.o. male coming in with complaint of B knee pain. Epidural in April 2024. Patient states that he would like pain medication for his trip. Both knees have been painful and changing his gait. Would like to find way to lose some weight.   Xray R knee 02/01/2023 IMPRESSION: Mild partial loss of joint space medially.   Xray L knee 02/01/2023 FINDINGS:  Mild partial loss of joint space medially. No other significant bony  or soft tissue abnormalities. ...   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 REPAIR OF LEFT INDEX AND LONG FINGER BY FUSION /  REPAIR OF RING FINGER;  Surgeon: Mack Hook, MD;  Location: [facility];  Service: Orthopedics;  Laterality: Left;   BACK SURGERY     CARDIAC CATHETERIZATION N/A 12/22/2015   Procedure: Left Heart Cath and Coronary Angiography;  Surgeon: Peter M [place], MD;  Location: [facility] INVASIVE CV LAB;  Service: Cardiovascular;  Laterality: N/A;   CARDIAC ELECTROPHYSIOLOGY STUDY AND ABLATION     ELECTROPHYSIOLOGIC STUDY N/A 02/19/2016   Procedure: SVT Ablation;  Surgeon: Marinus Maw, MD;  Location: [facility] INVASIVE CV LAB;  Service: Cardiovascular;  Laterality: N/A;   septal reconstruction  2017   TURBINATE REDUCTION Bilateral 07/2016   WISDOM TOOTH EXTRACTION     Social History   Socioeconomic History   Marital status: Married    Spouse name: Not on file   Number of children: Not on file   Years of education: Not on file  Highest education level: Associate degree: occupational, Scientist, product/process development, or vocational program  Occupational History   Not on file  Tobacco Use   Smoking status: Former    Current packs/day: 0.00    Average packs/day: 1 pack/day for 10.0 years (10.0 ttl pk-yrs)    Types: Cigarettes    Start date: 51    Quit date: 63    Years since quitting: 32.5   Smokeless tobacco: Never   Tobacco comments:    Uses Hookah Pipe occasionally  Vaping Use   Vaping status: Never Used  Substance and Sexual Activity   Alcohol use: No   Drug use: No   Sexual activity: Yes  Other Topics Concern   Not on file  Social History Narrative   Marital Status: Married Engineer, drilling)   Children:  4 (3  Sons/1 Daughter)   Pets:  None    Living Situation: Lives with spouse and 4 children   Origin:  He was born in [place].   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     Alcohol Use:  None   Drug Use:  None   Diet:  Regular   Exercise:  None   Hobbies:  Cards   Social Determinants of Health   Financial Resource Strain: High Risk (11/24/2022)   Overall Financial Resource Strain (CARDIA)    Difficulty of Paying Living Expenses: Very hard  Food Insecurity: Food Insecurity Present (11/24/2022)   Hunger Vital Sign    Worried About Running Out of Food in the Last Year: Often true    Ran Out of Food in the Last Year: Sometimes true  Transportation Needs: Patient Declined (11/24/2022)   PRAPARE - Transportation    Lack of Transportation (Medical): Patient declined    Lack of Transportation (Non-Medical): Patient declined  Physical Activity: Inactive (12/23/2021)   Exercise Vital Sign    Days of Exercise per Week: 0 days    Minutes of Exercise per Session: 0 min  Stress: No Stress Concern Present (12/23/2021)   [employer] of Occupational Health - Occupational Stress Questionnaire    Feeling of Stress : Not at all  Social Connections: Socially Integrated (11/24/2022)   Social Connection and Isolation Panel [NHANES]    Frequency of Communication with Friends and Family: More than three times a week    Frequency of Social Gatherings with Friends and Family: Twice a week    Attends Religious Services: More than 4 times per year    Active Member of [employer] or Organizations: Yes    Attends Banker Meetings: Patient declined    Marital Status: Married   Allergies  Allergen Reactions   Dust Mite Extract    Pork-Derived Products Swelling   Statins     Muscle aches   Tree Extract Other (See Comments)    Intolerance    Family History  Problem  Relation Age of Onset   Hypertension Father    Food Allergy Son    Colon cancer Neg Hx    Stomach cancer Neg Hx    Allergic rhinitis Neg Hx    Angioedema Neg Hx    Asthma Neg Hx    Eczema Neg Hx    Immunodeficiency Neg Hx    Urticaria Neg Hx     Current Outpatient Medications (Endocrine & Metabolic):    metFORMIN (GLUCOPHAGE) 500 MG tablet, TAKE 1 TABLET(500 MG) BY MOUTH TWICE DAILY WITH A MEAL  predniSONE (DELTASONE) 20 MG tablet, Take 1 tablet (20 mg total) by mouth daily with breakfast.  Current Outpatient Medications (Cardiovascular):    ezetimibe (ZETIA) 10 MG tablet, Take 1 tablet (10 mg total) by mouth daily.   fenofibrate (TRICOR) 145 MG tablet, Take 1 tablet (145 mg total) by mouth daily.   flecainide (TAMBOCOR) 150 MG tablet, TAKE 1 TABLET(150 MG) BY MOUTH TWICE DAILY   metoprolol tartrate (LOPRESSOR) 25 MG tablet, Take one tablet by mouth twice a day.  May take one additional tablet by mouth as needed for breakthrough palpitations.   sildenafil (REVATIO) 20 MG tablet, 2-5 tab one hour prior to sex.  Current Outpatient Medications (Respiratory):    albuterol (PROAIR HFA) 108 (90 Base) MCG/ACT inhaler, Inhale 1-2 puffs into the lungs every 6 (six) hours as needed for wheezing or shortness of breath.   azelastine (ASTELIN) 0.1 % nasal spray, 1-2 sprays per nostril 2 times daily as needed.   benzonatate (TESSALON) 100 MG capsule, Take 1 capsule (100 mg total) by mouth 3 (three) times daily as needed for cough.   Budeson-Glycopyrrol-Formoterol (BREZTRI AEROSPHERE) 160-9-4.8 MCG/ACT AERO, Inhale 2 puffs into the lungs in the morning and at bedtime.   levocetirizine (XYZAL) 5 MG tablet, TAKE 1 TABLET(5 MG) BY MOUTH EVERY EVENING   montelukast (SINGULAIR) 10 MG tablet, TAKE 1 TABLET BY MOUTH 1 TIME AT NIGHT FOR COUGHING OR WHEEZING  Current Outpatient Medications (Analgesics):    acetaminophen (TYLENOL) 325 MG tablet, Take 2 tablets (650 mg total) by mouth every 6 (six) hours.    HYDROcodone-acetaminophen (NORCO) 5-325 MG tablet, Take 1 tablet by mouth every 6 (six) hours as needed for moderate pain.   ibuprofen (ADVIL) 600 MG tablet, Take 600 mg by mouth in the morning and at bedtime.  Current Outpatient Medications (Hematological):    rivaroxaban (XARELTO) 20 MG TABS tablet, Take 1 tablet (20 mg total) by mouth daily with supper.  Current Outpatient Medications (Other):    famotidine-calcium carbonate-magnesium hydroxide (PEPCID COMPLETE) 10-800-165 MG chewable tablet, Chew by mouth.   gabapentin (NEURONTIN) 100 MG capsule, TAKE 1 CAPSULE(100 MG) BY MOUTH AT BEDTIME   Magnesium 250 MG TABS, Take 1 tablet by mouth once a week.   pantoprazole (PROTONIX) 40 MG tablet, Take 1 tablet (40 mg total) by mouth daily.   PRESCRIPTION MEDICATION, Inhale into the lungs at bedtime. CPAP   Semaglutide-Weight Management (WEGOVY) 0.25 MG/0.5ML SOAJ, 0.25 mg weekly injection   Vitamin D, Ergocalciferol, (DRISDOL) 1.25 MG (50000 UNIT) CAPS capsule, Take 1 capsule (50,000 Units total) by mouth every 7 (seven) days.   Reviewed prior external information including notes and imaging from  primary care provider As well as notes that were available from care everywhere and other healthcare systems.  Past medical history, social, surgical and family history all reviewed in electronic medical record.  No pertanent information unless stated regarding to the chief complaint.   Review of Systems:  No headache, visual changes, nausea, vomiting, diarrhea, constipation, dizziness, abdominal pain, skin rash, fevers, chills, night sweats, weight loss, swollen lymph nodes, body aches, joint swelling, chest pain, shortness of breath, mood changes. POSITIVE muscle aches  Objective  Blood pressure 118/82, pulse (!) 57, height 5\' 9"  (1.753 m), weight 260 lb (117.9 kg), SpO2 97%.   General: No apparent distress alert and oriented x3 mood and affect normal, dressed appropriately.  HEENT: Pupils equal,  extraocular movements intact  Respiratory: Patient's speak in full sentences and does not appear short of breath  Cardiovascular: No lower extremity edema, non tender, no erythema  Bilateral knees tender to palpation over the medial joint line left greater than right.  Crepitus noted. Low back does have loss of lordosis noted.  Tenderness to palpation in the paraspinal musculature.  After informed written and verbal consent, patient was seated on exam table. Right knee was prepped with alcohol swab and utilizing anterolateral approach, patient's right knee space was injected with 48 mg per 3 mL of Monovisc (sodium hyaluronate) in a prefilled syringe was injected easily into the knee through a 22-gauge needle..Patient tolerated the procedure well without immediate complications.  After informed written and verbal consent, patient was seated on exam table. Left knee was prepped with alcohol swab and utilizing anterolateral approach, patient's left knee space was injected with 48 mg per 3 mL of Monovisc (sodium hyaluronate) in a prefilled syringe was injected easily into the knee through a 22-gauge needle..Patient tolerated the procedure well without immediate complications.   Impression and Recommendations:     The above documentation has been reviewed and is accurate and complete Judi Saa, DO

## 2023-03-17 ENCOUNTER — Ambulatory Visit (INDEPENDENT_AMBULATORY_CARE_PROVIDER_SITE_OTHER): Payer: Medicare HMO | Admitting: Family Medicine

## 2023-03-17 ENCOUNTER — Encounter: Payer: Self-pay | Admitting: Family Medicine

## 2023-03-17 VITALS — BP 118/82 | HR 57 | Ht 69.0 in | Wt 260.0 lb

## 2023-03-17 DIAGNOSIS — M170 Bilateral primary osteoarthritis of knee: Secondary | ICD-10-CM | POA: Diagnosis not present

## 2023-03-17 MED ORDER — PREDNISONE 20 MG PO TABS: 20.0000 mg | ORAL_TABLET | Freq: Every day | ORAL | 0 refills | Status: DC

## 2023-03-17 MED ORDER — HYALURONAN 88 MG/4ML IX SOSY
176.0000 mg | PREFILLED_SYRINGE | Freq: Once | INTRA_ARTICULAR | Status: AC
Start: 2023-03-17 — End: 2023-03-17
  Administered 2023-03-17: 176 mg via INTRA_ARTICULAR

## 2023-03-17 NOTE — Patient Instructions (Signed)
Monovisc injection today in both knees Prednisone 20mg  for 7 days Travel safe See me in 2 months

## 2023-03-17 NOTE — Assessment & Plan Note (Signed)
Bilateral injections given today and tolerated the procedure well, discussed icing regimen and home exercises, discussed posture and ergonomics.  Follow-up with me again 8 weeks when patient returns to the country.  Will see how patient is doing at that time.  Prednisone given for any exacerbations

## 2023-03-23 ENCOUNTER — Telehealth: Payer: Self-pay

## 2023-03-23 NOTE — Telephone Encounter (Signed)
Medicare does not cover weight loss medications. PA for [facility] not completed.

## 2023-03-31 NOTE — Telephone Encounter (Signed)
Message forwarded to PA team

## 2023-04-01 NOTE — Telephone Encounter (Signed)
Pt has medicare which usually asks:   Does pt have evidence of any of the following: Myocardial infarction (MI), (2) Prior ischemic or hemorrhagic stroke, (3) Symptomatic peripheral arterial disease (PAD) as evidence by one of the following (a) Intermittent claudication with ankle-brachial index (ABI) less than 0.85 (at rest); (b) Peripheral arterial revascularization procedure; (c) Amputation due to atherosclerotic disease?   If not, pt will more than likely be denied through medicare for CV risks, as they have been very strict on this.

## 2023-04-12 ENCOUNTER — Ambulatory Visit: Payer: Medicaid Other | Admitting: Internal Medicine

## 2023-05-20 NOTE — Progress Notes (Unsigned)
[facility] [address] [place] [address] Phone: [phone] Subjective:   Richard Walker, am serving as a scribe for Dr. Antoine Primas.  I'm seeing this patient by the request  of:  Saguier, Kateri Mc  CC: Bilateral knee pain follow-up  [id number]  03/17/2023 Bilateral injections given today and tolerated the procedure well, discussed icing regimen and home exercises, discussed posture and ergonomics.  Follow-up with me again 8 weeks when [place] the country.  Will see how patient is doing at that time.  Prednisone given for any exacerbations     Updated 05/24/2023 Richard Walker is a 67 y.o. male coming in with complaint of B knee pain, Known arthritic changes, given viscosupplementation 2 months ago.  Patient states last time injections helped. Felt a difference.        Past Medical History:  Diagnosis Date   DDD (degenerative disc disease), lumbar    L3/L4   Diverticulosis    GERD (gastroesophageal reflux disease)    Hyperlipidemia    Low sperm motility    Pre-diabetes    Sleep apnea    uses cpap    SVT (supraventricular tachycardia)    Past Surgical History:  Procedure Laterality Date   AMPUTATION Left 07/15/2018   Procedure: . REPAIR OF LEFT INDEX AND LONG FINGER BY FUSION /  REPAIR OF RING FINGER;  Surgeon: Mack Hook, MD;  Location: [facility] OR;  Service: Orthopedics;  Laterality: Left;   BACK SURGERY     CARDIAC CATHETERIZATION N/A 12/22/2015   Procedure: Left Heart Cath and Coronary Angiography;  Surgeon: Peter M Swaziland, MD;  Location: [facility] INVASIVE CV LAB;  Service: Cardiovascular;  Laterality: N/A;   CARDIAC ELECTROPHYSIOLOGY STUDY AND ABLATION     ELECTROPHYSIOLOGIC STUDY N/A 02/19/2016   Procedure: SVT Ablation;  Surgeon: Marinus Maw, MD;  Location: [facility] INVASIVE CV LAB;  Service: Cardiovascular;  Laterality: N/A;   septal reconstruction  2017   TURBINATE REDUCTION Bilateral 07/2016   WISDOM TOOTH  EXTRACTION     Social History   Socioeconomic History   Marital status: Married    Spouse name: Not on file   Number of children: Not on file   Years of education: Not on file   Highest education level: Associate degree: occupational, Scientist, product/process development, or vocational program  Occupational History   Not on file  Tobacco Use   Smoking status: Former    Current packs/day: 0.00    Average packs/day: 1 pack/day for 10.0 years (10.0 ttl pk-yrs)    Types: Cigarettes    Start date: 19    Quit date: 19    Years since quitting: 32.7   Smokeless tobacco: Never   Tobacco comments:    Uses Hookah Pipe occasionally  Vaping Use   Vaping status: Never Used  Substance and Sexual Activity   Alcohol use: No   Drug use: No   Sexual activity: Yes  Other Topics Concern   Not on file  Social History Narrative   Marital Status: Married Engineer, drilling)   Children:  4 (3 Sons/1 Daughter)   Pets:  None    Living Situation: Lives with spouse and 4 children   Origin:  He was born in [place].   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     Alcohol Use:  None   Drug Use:  None   Diet:  Regular   Exercise:  None   Hobbies:  Cards   Social Determinants of Health   Financial Resource Strain: High Risk (11/24/2022)   Overall Financial Resource Strain (CARDIA)    Difficulty of Paying Living Expenses: Very hard  Food Insecurity: Food Insecurity Present (11/24/2022)   Hunger Vital Sign    Worried About Running Out of Food in the Last Year: Often true    Ran Out of Food in the Last Year: Sometimes true  Transportation Needs: Patient Declined (11/24/2022)   PRAPARE - Administrator, Civil Service (Medical): Patient declined    Lack of Transportation (Non-Medical): Patient declined  Physical Activity: Inactive (12/23/2021)   Exercise Vital Sign    Days of Exercise per Week:  0 days    Minutes of Exercise per Session: 0 min  Stress: No Stress Concern Present (12/23/2021)   [employer] of Occupational Health - Occupational Stress Questionnaire    Feeling of Stress : Not at all  Social Connections: Socially Integrated (11/24/2022)   Social Connection and Isolation Panel [NHANES]    Frequency of Communication with Friends and Family: More than three times a week    Frequency of Social Gatherings with Friends and Family: Twice a week    Attends Religious Services: More than 4 times per year    Active Member of [employer] or Organizations: Yes    Attends Banker Meetings: Patient declined    Marital Status: Married   Allergies  Allergen Reactions   Dust Mite Extract    Pork-Derived Products Swelling   Statins     Muscle aches   Tree Extract Other (See Comments)    Intolerance    Family History  Problem Relation Age of Onset   Hypertension Father    Food Allergy Son    Colon cancer Neg Hx    Stomach cancer Neg Hx    Allergic rhinitis Neg Hx    Angioedema Neg Hx    Asthma Neg Hx    Eczema Neg Hx    Immunodeficiency Neg Hx    Urticaria Neg Hx     Current Outpatient Medications (Endocrine & Metabolic):    metFORMIN (GLUCOPHAGE) 500 MG tablet, TAKE 1 TABLET(500 MG) BY MOUTH TWICE DAILY WITH A MEAL   predniSONE (DELTASONE) 20 MG tablet, Take 1 tablet (20 mg total) by mouth daily with breakfast.  Current Outpatient Medications (Cardiovascular):    ezetimibe (ZETIA) 10 MG tablet, Take 1 tablet (10 mg total) by mouth daily.   fenofibrate (TRICOR) 145 MG tablet, Take 1 tablet (145 mg total) by mouth daily.   flecainide (TAMBOCOR) 150 MG tablet, TAKE 1 TABLET(150 MG) BY MOUTH TWICE DAILY   metoprolol tartrate (LOPRESSOR) 25 MG tablet, Take one tablet by mouth twice a day.  May take one additional tablet by mouth as needed for breakthrough palpitations.   sildenafil (REVATIO) 20 MG tablet, 2-5 tab one hour prior to sex.  Current Outpatient  Medications (Respiratory):    albuterol (PROAIR HFA) 108 (90 Base) MCG/ACT inhaler, Inhale 1-2 puffs into the lungs every 6 (six) hours as needed for wheezing or shortness of breath.   azelastine (ASTELIN) 0.1 % nasal spray, 1-2 sprays per nostril 2 times daily as needed.   benzonatate (TESSALON) 100 MG capsule, Take 1 capsule (100 mg total) by mouth 3 (three) times daily as needed for cough.   Budeson-Glycopyrrol-Formoterol (BREZTRI AEROSPHERE) 160-9-4.8 MCG/ACT AERO, Inhale 2 puffs into the lungs in the morning and at bedtime.  levocetirizine (XYZAL) 5 MG tablet, TAKE 1 TABLET(5 MG) BY MOUTH EVERY EVENING   montelukast (SINGULAIR) 10 MG tablet, TAKE 1 TABLET BY MOUTH 1 TIME AT NIGHT FOR COUGHING OR WHEEZING  Current Outpatient Medications (Analgesics):    acetaminophen (TYLENOL) 325 MG tablet, Take 2 tablets (650 mg total) by mouth every 6 (six) hours.   HYDROcodone-acetaminophen (NORCO) 5-325 MG tablet, Take 1 tablet by mouth every 6 (six) hours as needed for moderate pain.   ibuprofen (ADVIL) 600 MG tablet, Take 600 mg by mouth in the morning and at bedtime.  Current Outpatient Medications (Hematological):    rivaroxaban (XARELTO) 20 MG TABS tablet, Take 1 tablet (20 mg total) by mouth daily with supper.  Current Outpatient Medications (Other):    famotidine-calcium carbonate-magnesium hydroxide (PEPCID COMPLETE) 10-800-165 MG chewable tablet, Chew by mouth.   gabapentin (NEURONTIN) 100 MG capsule, TAKE 1 CAPSULE(100 MG) BY MOUTH AT BEDTIME   Magnesium 250 MG TABS, Take 1 tablet by mouth once a week.   pantoprazole (PROTONIX) 40 MG tablet, Take 1 tablet (40 mg total) by mouth daily.   PRESCRIPTION MEDICATION, Inhale into the lungs at bedtime. CPAP   Semaglutide-Weight Management (WEGOVY) 0.25 MG/0.5ML SOAJ, 0.25 mg weekly injection   Vitamin D, Ergocalciferol, (DRISDOL) 1.25 MG (50000 UNIT) CAPS capsule, Take 1 capsule (50,000 Units total) by mouth every 7 (seven) days.   Reviewed  prior external information including notes and imaging from  primary care provider As well as notes that were available from care everywhere and other healthcare systems.  Past medical history, social, surgical and family history all reviewed in electronic medical record.  No pertanent information unless stated regarding to the chief complaint.   Review of Systems:  No headache, visual changes, nausea, vomiting, diarrhea, constipation, dizziness, abdominal pain, skin rash, fevers, chills, night sweats, weight loss, swollen lymph nodes, body aches, joint swelling, chest pain, shortness of breath, mood changes. POSITIVE muscle aches  Objective  Pulse 67, height 5\' 9"  (1.753 m), weight 255 lb (115.7 kg), SpO2 96%.   General: No apparent distress alert and oriented x3 mood and affect normal, dressed appropriately.  HEENT: Pupils equal, extraocular movements intact  Respiratory: Patient's speak in full sentences and does not appear short of breath  Cardiovascular: No lower extremity edema, non tender, no erythema  Bilateral knees to have crepitus noted.  Some tenderness to palpation noted.  Instability with valgus and varus force  After informed written and verbal consent, patient was seated on exam table. Right knee was prepped with alcohol swab and utilizing anterolateral approach, patient's right knee space was injected with 4:1  marcaine Toradol 30 mg/dL patient tolerated the procedure well without immediate complications.  After informed written and verbal consent, patient was seated on exam table. Left knee was prepped with alcohol swab and utilizing anterolateral approach, patient's left knee space was injected with 4:1  marcaine 0.5%: Toradol 30 mg/dL. Patient tolerated the procedure well without immediate complications.    Impression and Recommendations:     The above documentation has been reviewed and is accurate and complete Judi Saa, DO

## 2023-05-22 ENCOUNTER — Other Ambulatory Visit: Payer: Self-pay | Admitting: Family Medicine

## 2023-05-22 ENCOUNTER — Other Ambulatory Visit: Payer: Self-pay | Admitting: Medical

## 2023-05-24 ENCOUNTER — Encounter: Payer: Self-pay | Admitting: Family Medicine

## 2023-05-24 ENCOUNTER — Ambulatory Visit (INDEPENDENT_AMBULATORY_CARE_PROVIDER_SITE_OTHER): Payer: Medicare Other | Admitting: Family Medicine

## 2023-05-24 VITALS — HR 67 | Ht 69.0 in | Wt 255.0 lb

## 2023-05-24 DIAGNOSIS — M170 Bilateral primary osteoarthritis of knee: Secondary | ICD-10-CM

## 2023-05-24 MED ORDER — KETOROLAC TROMETHAMINE 30 MG/ML IJ SOLN
60.0000 mg | Freq: Once | INTRAMUSCULAR | Status: AC
Start: 2023-05-24 — End: 2023-05-24
  Administered 2023-05-24: 60 mg via INTRAMUSCULAR

## 2023-05-24 NOTE — Patient Instructions (Signed)
Great to see you  Toradol injection in the knees today  Can do the gel injections in 09/17/23 Order back injection and you can schedule when you would like  Ask about Wegovy to your PCP  See you next year or call me sooner

## 2023-05-24 NOTE — Assessment & Plan Note (Signed)
Chronic problems worsening symptoms, still trying to lose weight, we will consider the possibility of surgical intervention at some point.  BMI is under 40 at this time.  Patient is considering starting diet medication he was able to buy when he was in the [place].  Follow-up with me again 6 to 8 weeks can have another viscosupplementation in January.

## 2023-06-06 ENCOUNTER — Encounter: Payer: Self-pay | Admitting: Internal Medicine

## 2023-06-06 ENCOUNTER — Ambulatory Visit: Payer: Medicare Other | Attending: Internal Medicine | Admitting: Internal Medicine

## 2023-06-06 VITALS — BP 132/82 | HR 61 | Ht 69.0 in | Wt 254.0 lb

## 2023-06-06 DIAGNOSIS — I480 Paroxysmal atrial fibrillation: Secondary | ICD-10-CM | POA: Insufficient documentation

## 2023-06-06 MED ORDER — RIVAROXABAN 20 MG PO TABS: 20.0000 mg | ORAL_TABLET | Freq: Every day | ORAL | Status: DC

## 2023-06-06 MED ORDER — METOPROLOL TARTRATE 25 MG PO TABS: ORAL_TABLET | ORAL | 3 refills | Status: DC

## 2023-06-06 MED ORDER — FLECAINIDE ACETATE 150 MG PO TABS: ORAL_TABLET | ORAL | 3 refills | Status: DC

## 2023-06-06 NOTE — Patient Instructions (Signed)
Medication Instructions:  Your physician recommends that you continue on your current medications as directed. Please refer to the Current Medication list given to you today.  *If you need a refill on your cardiac medications before your next appointment, please call your pharmacy*  Lab Work: None ordered.  If you have labs (blood work) drawn today and your tests are completely normal, you will receive your results only by: MyChart Message (if you have MyChart) OR A paper copy in the mail If you have any lab test that is abnormal or we need to change your treatment, we will call you to review the results.  Testing/Procedures: None ordered.  Follow-Up: At [facility], you and your health needs are our priority.  As part of our continuing mission to provide you with exceptional heart care, we have created designated Provider Care Teams.  These Care Teams include your primary Cardiologist (physician) and Advanced Practice Providers (APPs -  Physician Assistants and Nurse Practitioners) who all work together to provide you with the care you need, when you need it.   Your next appointment:   1 year(s)  The format for your next appointment:   In Person  Provider:   Lewayne Bunting, MD{or one of the following Advanced Practice Providers on your designated Care Team:   Francis Dowse, [place] Casimiro Needle "Mardelle Matte" [place], [place] Earnest Rosier, NP    Important Information About Sugar

## 2023-06-06 NOTE — Progress Notes (Signed)
HPI [name] returns today for followup. He is a pleasant 67 yo man with SVT s/p ablation who developed palpitations and was found to have PAF. He has been maintained on fairly high dose flecainide. He wore a cardiac monitor several months ago and was found to have occaisional PAC's and PVC's but no sustained arrhythmias. His dyspnea is improved. He admits to being more sedentary since his right knee became a problem limiting his activity. He is considering ozempic. His weight continues higher. Allergies  Allergen Reactions   Dust Mite Extract    Pork-Derived Products Swelling   Statins     Muscle aches   Tree Extract Other (See Comments)    Intolerance      Current Outpatient Medications  Medication Sig Dispense Refill   acetaminophen (TYLENOL) 325 MG tablet Take 2 tablets (650 mg total) by mouth every 6 (six) hours.     albuterol (PROAIR HFA) 108 (90 Base) MCG/ACT inhaler Inhale 1-2 puffs into the lungs every 6 (six) hours as needed for wheezing or shortness of breath. 24 g 3   azelastine (ASTELIN) 0.1 % nasal spray 1-2 sprays per nostril 2 times daily as needed. 90 mL 3   benzonatate (TESSALON) 100 MG capsule Take 1 capsule (100 mg total) by mouth 3 (three) times daily as needed for cough. 30 capsule 0   Budeson-Glycopyrrol-Formoterol (BREZTRI AEROSPHERE) 160-9-4.8 MCG/ACT AERO Inhale 2 puffs into the lungs in the morning and at bedtime. 5.9 g 5   ezetimibe (ZETIA) 10 MG tablet Take 1 tablet (10 mg total) by mouth daily. 90 tablet 3   famotidine-calcium carbonate-magnesium hydroxide (PEPCID COMPLETE) 10-800-165 MG chewable tablet Chew by mouth.     fenofibrate (TRICOR) 145 MG tablet Take 1 tablet (145 mg total) by mouth daily. 90 tablet 3   gabapentin (NEURONTIN) 100 MG capsule TAKE 1 CAPSULE(100 MG) BY MOUTH AT BEDTIME 90 capsule 0   HYDROcodone-acetaminophen (NORCO) 5-325 MG tablet Take 1 tablet by mouth every 6 (six) hours as needed for moderate pain. 20 tablet 0    ibuprofen (ADVIL) 600 MG tablet Take 600 mg by mouth in the morning and at bedtime.     levocetirizine (XYZAL) 5 MG tablet TAKE 1 TABLET(5 MG) BY MOUTH EVERY EVENING 30 tablet 3   Magnesium 250 MG TABS Take 1 tablet by mouth once a week.     metFORMIN (GLUCOPHAGE) 500 MG tablet TAKE 1 TABLET(500 MG) BY MOUTH TWICE DAILY WITH A MEAL 60 tablet 11   montelukast (SINGULAIR) 10 MG tablet TAKE 1 TABLET BY MOUTH 1 TIME AT NIGHT FOR COUGHING OR WHEEZING 90 tablet 3   pantoprazole (PROTONIX) 40 MG tablet Take 1 tablet (40 mg total) by mouth daily. 30 tablet 11   predniSONE (DELTASONE) 20 MG tablet Take 1 tablet (20 mg total) by mouth daily with breakfast. 7 tablet 0   PRESCRIPTION MEDICATION Inhale into the lungs at bedtime. CPAP     Semaglutide-Weight Management (WEGOVY) 0.25 MG/0.5ML SOAJ 0.25 mg weekly injection 2 mL 0   sildenafil (REVATIO) 20 MG tablet 2-5 tab one hour prior to sex. 40 tablet 0   Vitamin D, Ergocalciferol, (DRISDOL) 1.25 MG (50000 UNIT) CAPS capsule Take 1 capsule (50,000 Units total) by mouth every 7 (seven) days. 8 capsule 3   flecainide (TAMBOCOR) 150 MG tablet TAKE 1 TABLET(150 MG) BY MOUTH TWICE DAILY 180 tablet 3   metoprolol tartrate (LOPRESSOR) 25 MG tablet Take one tablet by mouth twice a day.  May take one additional tablet by mouth as needed for breakthrough palpitations. 270 tablet 3   rivaroxaban (XARELTO) 20 MG TABS tablet Take 1 tablet (20 mg total) by mouth daily with supper.     No current facility-administered medications for this visit.     Past Medical History:  Diagnosis Date   DDD (degenerative disc disease), lumbar    L3/L4   Diverticulosis    GERD (gastroesophageal reflux disease)    Hyperlipidemia    Low sperm motility    Pre-diabetes    Sleep apnea    uses cpap    SVT (supraventricular tachycardia) (HCC)     ROS:   All systems reviewed and negative except as noted in the HPI.   Past Surgical History:  Procedure Laterality Date    AMPUTATION Left 07/15/2018   Procedure: . REPAIR OF LEFT INDEX AND LONG FINGER BY FUSION /  REPAIR OF RING FINGER;  Surgeon: Mack Hook, MD;  Location: [facility] OR;  Service: Orthopedics;  Laterality: Left;   BACK SURGERY     CARDIAC CATHETERIZATION N/A 12/22/2015   Procedure: Left Heart Cath and Coronary Angiography;  Surgeon: Peter M Swaziland, MD;  Location: [facility] INVASIVE CV LAB;  Service: Cardiovascular;  Laterality: N/A;   CARDIAC ELECTROPHYSIOLOGY STUDY AND ABLATION     ELECTROPHYSIOLOGIC STUDY N/A 02/19/2016   Procedure: SVT Ablation;  Surgeon: Marinus Maw, MD;  Location: [facility] INVASIVE CV LAB;  Service: Cardiovascular;  Laterality: N/A;   septal reconstruction  2017   TURBINATE REDUCTION Bilateral 07/2016   WISDOM TOOTH EXTRACTION       Family History  Problem Relation Age of Onset   Hypertension Father    Food Allergy Son    Colon cancer Neg Hx    Stomach cancer Neg Hx    Allergic rhinitis Neg Hx    Angioedema Neg Hx    Asthma Neg Hx    Eczema Neg Hx    Immunodeficiency Neg Hx    Urticaria Neg Hx      Social History   Socioeconomic History   Marital status: Married    Spouse name: Not on file   Number of children: Not on file   Years of education: Not on file   Highest education level: Associate degree: occupational, Scientist, product/process development, or vocational program  Occupational History   Not on file  Tobacco Use   Smoking status: Former    Current packs/day: 0.00    Average packs/day: 1 pack/day for 10.0 years (10.0 ttl pk-yrs)    Types: Cigarettes    Start date: 17    Quit date: 1992    Years since quitting: 32.8   Smokeless tobacco: Never   Tobacco comments:    Uses Hookah Pipe occasionally  Vaping Use   Vaping status: Never Used  Substance and Sexual Activity   Alcohol use: No   Drug use: No   Sexual activity: Yes  Other Topics Concern   Not on file  Social History Narrative   Marital Status: Married Engineer, drilling)   Children:  4 (3 Sons/1 Daughter)   Pets:  None     Living Situation: Lives with spouse and 4 children   Origin:  He was born in [place].   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     Alcohol Use:  None   Drug Use:  None   Diet:  Regular   Exercise:  None   Hobbies:  Cards   Social Determinants of Health   Financial Resource Strain: High Risk (11/24/2022)   Overall Financial Resource Strain (CARDIA)    Difficulty of Paying Living Expenses: Very hard  Food Insecurity: Food Insecurity Present (11/24/2022)   Hunger Vital Sign    Worried About Running Out of Food in the Last Year: Often true    Ran Out of Food in the Last Year: Sometimes true  Transportation Needs: Patient Declined (11/24/2022)   PRAPARE - Administrator, Civil Service (Medical): Patient declined    Lack of Transportation (Non-Medical): Patient declined  Physical Activity: Inactive (12/23/2021)   Exercise Vital Sign    Days of Exercise per Week: 0 days    Minutes of Exercise per Session: 0 min  Stress: No Stress Concern Present (12/23/2021)   [employer] of Occupational Health - Occupational Stress Questionnaire    Feeling of Stress : Not at all  Social Connections: Socially Integrated (11/24/2022)   Social Connection and Isolation Panel [NHANES]    Frequency of Communication with Friends and Family: More than three times a week    Frequency of Social Gatherings with Friends and Family: Twice a week    Attends Religious Services: More than 4 times per year    Active Member of [employer] or Organizations: Yes    Attends Banker Meetings: Patient declined    Marital Status: Married  Catering manager Violence: Not At Risk (12/29/2022)   Humiliation, Afraid, Rape, and Kick questionnaire    Fear of Current or Ex-Partner: No    Emotionally Abused: No    Physically Abused: No    Sexually Abused: No     BP 132/82   Pulse  61   Ht 5\' 9"  (1.753 m)   Wt 254 lb (115.2 kg)   SpO2 97%   BMI 37.51 kg/m   Physical Exam:  Well appearing NAD HEENT: Unremarkable Neck:  No JVD, no thyromegally Lymphatics:  No adenopathy Back:  No CVA tenderness Lungs:  Clear HEART:  Regular rate rhythm, no murmurs, no rubs, no clicks Abd:  soft, positive bowel sounds, no organomegally, no rebound, no guarding Ext:  2 plus pulses, no edema, no cyanosis, no clubbing Skin:  No rashes no nodules Neuro:  CN II through XII intact, motor grossly intact  EKG - nsr  Assess/Plan: PAF - he is maintaining NSR on flecainide. Continue. Obesity - I encourage him to take ozempic as he is able to afford. 3. SVT - he has had none since his ablation. 4. Dyspnea - appears improved though I suspect that it is related to a more sedentary lifestyle.   Sharlot Gowda Naveen Clardy,MD

## 2023-06-26 ENCOUNTER — Other Ambulatory Visit: Payer: Self-pay | Admitting: Medical

## 2023-09-07 ENCOUNTER — Telehealth: Payer: Self-pay | Admitting: Medical

## 2023-09-07 ENCOUNTER — Encounter: Payer: Self-pay | Admitting: Medical

## 2023-09-07 DIAGNOSIS — E785 Hyperlipidemia, unspecified: Secondary | ICD-10-CM

## 2023-09-07 DIAGNOSIS — I10 Essential (primary) hypertension: Secondary | ICD-10-CM

## 2023-09-07 DIAGNOSIS — R739 Hyperglycemia, unspecified: Secondary | ICD-10-CM

## 2023-09-07 DIAGNOSIS — E559 Vitamin D deficiency, unspecified: Secondary | ICD-10-CM

## 2023-09-07 NOTE — Telephone Encounter (Signed)
 Pt wants to get labs done before appt on 1/22 for a cpe. Please call pt and schedule lab appt when orders have been placed.

## 2023-09-07 NOTE — Telephone Encounter (Signed)
 Telephone note sent to PCP to place orders

## 2023-09-12 ENCOUNTER — Other Ambulatory Visit (INDEPENDENT_AMBULATORY_CARE_PROVIDER_SITE_OTHER): Payer: Medicare (Managed Care)

## 2023-09-12 ENCOUNTER — Encounter: Payer: Self-pay | Admitting: Medical

## 2023-09-12 DIAGNOSIS — I10 Essential (primary) hypertension: Secondary | ICD-10-CM

## 2023-09-12 DIAGNOSIS — E785 Hyperlipidemia, unspecified: Secondary | ICD-10-CM

## 2023-09-12 DIAGNOSIS — R739 Hyperglycemia, unspecified: Secondary | ICD-10-CM

## 2023-09-12 DIAGNOSIS — E559 Vitamin D deficiency, unspecified: Secondary | ICD-10-CM | POA: Diagnosis not present

## 2023-09-12 LAB — COMPREHENSIVE METABOLIC PANEL
ALT: 19 U/L (ref 0–53)
AST: 15 U/L (ref 0–37)
Albumin: 4.3 g/dL (ref 3.5–5.2)
Alkaline Phosphatase: 84 U/L (ref 39–117)
BUN: 17 mg/dL (ref 6–23)
CO2: 30 meq/L (ref 19–32)
Calcium: 9.7 mg/dL (ref 8.4–10.5)
Chloride: 101 meq/L (ref 96–112)
Creatinine, Ser: 1.06 mg/dL (ref 0.40–1.50)
GFR: 72.76 mL/min (ref >60.00)
Glucose, Bld: 106 mg/dL — ABNORMAL HIGH (ref 70–99)
Potassium: 4.7 meq/L (ref 3.5–5.1)
Sodium: 139 meq/L (ref 135–145)
Total Bilirubin: 0.8 mg/dL (ref 0.2–1.2)
Total Protein: 7 g/dL (ref 6.0–8.3)

## 2023-09-12 LAB — CBC WITH DIFFERENTIAL/PLATELET
Basophils Absolute: 0.1 10*3/uL (ref 0.0–0.1)
Basophils Relative: 0.7 % (ref 0.0–3.0)
Eosinophils Absolute: 0.1 10*3/uL (ref 0.0–0.7)
Eosinophils Relative: 1.5 % (ref 0.0–5.0)
HCT: 51.1 % (ref 39.0–52.0)
Hemoglobin: 16.8 g/dL (ref 13.0–17.0)
Lymphocytes Relative: 26.6 % (ref 12.0–46.0)
Lymphs Abs: 1.9 10*3/uL (ref 0.7–4.0)
MCHC: 32.8 g/dL (ref 30.0–36.0)
MCV: 84.2 fL (ref 78.0–100.0)
Monocytes Absolute: 0.4 10*3/uL (ref 0.1–1.0)
Monocytes Relative: 5.3 % (ref 3.0–12.0)
Neutro Abs: 4.8 10*3/uL (ref 1.4–7.7)
Neutrophils Relative %: 65.9 % (ref 43.0–77.0)
Platelets: 208 10*3/uL (ref 150.0–400.0)
RBC: 6.07 Mil/uL — ABNORMAL HIGH (ref 4.22–5.81)
RDW: 15.1 % (ref 11.5–15.5)
WBC: 7.3 10*3/uL (ref 4.0–10.5)

## 2023-09-12 LAB — LIPID PANEL
Cholesterol: 248 mg/dL — ABNORMAL HIGH (ref 0–200)
HDL: 41.5 mg/dL (ref >39.00)
LDL Cholesterol: 168 mg/dL — ABNORMAL HIGH (ref 0–99)
NonHDL: 206.57
Total CHOL/HDL Ratio: 6
Triglycerides: 195 mg/dL — ABNORMAL HIGH (ref 0.0–149.0)
VLDL: 39 mg/dL (ref 0.0–40.0)

## 2023-09-12 LAB — HEMOGLOBIN A1C: Hgb A1c MFr Bld: 6.1 % (ref 4.6–6.5)

## 2023-09-12 LAB — VITAMIN D 25 HYDROXY (VIT D DEFICIENCY, FRACTURES): VITD: 35.78 ng/mL (ref 30.00–100.00)

## 2023-09-12 NOTE — Telephone Encounter (Signed)
Pt notified via mychart

## 2023-09-12 NOTE — Telephone Encounter (Signed)
Pt called and lvm to return call and schedule lab visit

## 2023-09-14 ENCOUNTER — Encounter: Payer: Self-pay | Admitting: Medical

## 2023-09-14 ENCOUNTER — Ambulatory Visit (INDEPENDENT_AMBULATORY_CARE_PROVIDER_SITE_OTHER): Payer: Medicare (Managed Care) | Admitting: Medical

## 2023-09-14 VITALS — BP 138/90 | HR 56 | Resp 18 | Ht 69.0 in | Wt 267.0 lb

## 2023-09-14 DIAGNOSIS — E785 Hyperlipidemia, unspecified: Secondary | ICD-10-CM

## 2023-09-14 DIAGNOSIS — I10 Essential (primary) hypertension: Secondary | ICD-10-CM

## 2023-09-14 DIAGNOSIS — Z6835 Body mass index (BMI) 35.0-35.9, adult: Secondary | ICD-10-CM | POA: Diagnosis not present

## 2023-09-14 DIAGNOSIS — M2550 Pain in unspecified joint: Secondary | ICD-10-CM | POA: Diagnosis not present

## 2023-09-14 DIAGNOSIS — R972 Elevated prostate specific antigen [PSA]: Secondary | ICD-10-CM

## 2023-09-14 DIAGNOSIS — E66812 Obesity, class 2: Secondary | ICD-10-CM | POA: Diagnosis not present

## 2023-09-14 DIAGNOSIS — R351 Nocturia: Secondary | ICD-10-CM

## 2023-09-14 DIAGNOSIS — E559 Vitamin D deficiency, unspecified: Secondary | ICD-10-CM

## 2023-09-14 DIAGNOSIS — Z125 Encounter for screening for malignant neoplasm of prostate: Secondary | ICD-10-CM

## 2023-09-14 DIAGNOSIS — R739 Hyperglycemia, unspecified: Secondary | ICD-10-CM

## 2023-09-14 DIAGNOSIS — Z1211 Encounter for screening for malignant neoplasm of colon: Secondary | ICD-10-CM

## 2023-09-14 DIAGNOSIS — K219 Gastro-esophageal reflux disease without esophagitis: Secondary | ICD-10-CM

## 2023-09-14 MED ORDER — SILDENAFIL CITRATE 20 MG PO TABS: ORAL_TABLET | ORAL | 0 refills | Status: DC

## 2023-09-14 MED ORDER — LOSARTAN POTASSIUM 25 MG PO TABS: 25.0000 mg | ORAL_TABLET | Freq: Every day | ORAL | 3 refills | Status: DC

## 2023-09-14 MED ORDER — METFORMIN HCL 500 MG PO TABS
500.0000 mg | ORAL_TABLET | Freq: Two times a day (BID) | ORAL | 3 refills | Status: AC
  Filled 2024-05-31 – 2024-07-02 (×2): qty 180, 90d supply, fill #0

## 2023-09-14 MED ORDER — FENOFIBRATE 145 MG PO TABS: 145.0000 mg | ORAL_TABLET | Freq: Every day | ORAL | 3 refills | Status: DC

## 2023-09-14 MED ORDER — EZETIMIBE 10 MG PO TABS: 10.0000 mg | ORAL_TABLET | Freq: Every day | ORAL | 3 refills | Status: DC

## 2023-09-14 NOTE — Patient Instructions (Addendum)
Prediabetes A1c of 6.1, indicating an average blood glucose of 122. Discussed the importance of lifestyle modifications and continued monitoring. -Continue Metformin. -Check A1c in 3 months.  Hyperlipidemia Elevated total cholesterol, triglycerides, and LDL. Patient was not on medication at the time of the test. -Resume Zetia 10mg  and Fenofibrate 145mg . -Check lipid panel in 3 months.  Vitamin D Deficiency Despite weekly high-dose Vitamin D supplementation, levels remain on the lower end of the normal range. Patient has a family history of decreased bone density. -Increase Vitamin D supplementation to 4000 international units daily. -Check Vitamin D level in 3 months.  Hypertension Losartan 25mg  daily for better control -Check blood pressure daily, particularly in the morning. -Report blood pressure readings in 10 days.  Erectile Dysfunction Patient has been using Sildenafil 20mg . -Continue Sildenafil 20mg  as needed(per rx instruction)  Screening prostate cancer and hx of frequent urination. Patient reports urinating 2-3 times per night. -Order PSA today. -Consider referral to urology if PSA is elevated or symptoms worsen.  Ankle Pain(one event one week ago. Now resolved) Patient reports a previous episode of severe ankle pain. Previous x-ray showed a heel spur but no major arthritic changes. -Consider ordering uric acid level and repeat ankle x-ray if pain recurs.  General Health Maintenance -Referral to [facility] for weight management. -Check in with patient in early February regarding new insurance and potential prescription benefits. -referral for colonoscopy placed.  Follow up 3 months or sooner if needed.

## 2023-09-14 NOTE — Progress Notes (Signed)
Subjective:    Patient ID: Richard Walker, male    DOB: [birth year], 68 y.o.   MRN: [id number]  HPI  Pt in for follow up/check up. Last visit with me was below. (Past summer)  "1. Class 2 severe obesity due to excess calories with serious comorbidity and body mass index (BMI) of 35.0 to 35.9 in adult [facility]) Unfortunately we will go the was not covered by your insurance.  Did talk to the pharmacist at the med center and he told me at today that he has not found a Medicare plan that does cover [phone].  Will try to get names of local weight loss clinic standpoint prescribe medication more economically.   2. Pain in both knees, unspecified chronicity Continue to follow up with specialist for injections.   3. Hypertension, unspecified type Blood pressure well-controlled today.  Continue current cardiac medications and follow-up with cardiologist. - Comp Met (CMET)   4. Hyperlipidemia, unspecified hyperlipidemia type Will check lipid panel today.  Continue fenofibrate. - Lipid panel   5. Vitamin D deficiency Vitamin D refilled today and will check vitamin D level. - VITAMIN D 25 Hydroxy (Vit-D Deficiency, Fractures)   6. Other fatigue For fatigue follow-up due to below labs.  Particularly if your B12 is low as that has been borderline low in the past. - CBC w/Diff - B12 - Vitamin B1 - TSH - T4, free   7. Elevated blood sugar - Hemoglobin A1c.  If all lab review you are not in the diabetic range then we will prescribe Ozempic.  Continue metformin presently."  Discussed the use of AI scribe software for clinical note transcription with the patient, who gave verbal consent to proceed.  History of Present Illness   The patient, with a history of prediabetes, hypertension, obesity, sleep apnea, high cholesterol, and atrial fibrillation, presents with concerns about worsening lab results. He reports a recent increase in his HbA1c to 6.1, which he understands to be in the prediabetic range.  He expresses concern about the trajectory of his blood glucose levels and a desire to take action to prevent progression to diabetes.  The patient also reports a recent episode of severe rt  lateral ankle pain that woke him up at night about one week ago. The pain resolved on its own but was similar to a previous sensation described as a needle going into his bones. He has a family history of decreased bone density and is concerned about his own bone health. He is currently on a weekly high dose of vitamin D and has been advised to supplement this with a daily over-the-counter dose. Offered xray but he declined.  In addition to these concerns, the patient mentions an increase in nocturia, waking up to urinate two to three times a night. No fever, no chills no sweats. No hesitant urine flow or dribbling.   He also reports that his cholesterol levels have increased since he was last tested, during a period when he was not on medication. He has been prescribed losartan for his hypertension and is due to start taking it soon.  The patient is actively seeking ways to manage his weight and has an upcoming appointment with a health and wellness clinic. in past had rx'd wegovy but insurance declined despite obesity and other indcations documented on rx request.    He has previously been prescribed medication for erectile dysfunction . Used revatio in past and shopped for best price.    He is due to  have a PSA test and is aware that his levels have previously been within the normal range.         Review of Systems See hpi    Objective:   Physical Exam  General Mental Status- Alert. General Appearance- Not in acute distress.     Neck  No JVD.  Chest and Lung Exam Auscultation: Breath Sounds:-Normal.  Cardiovascular Auscultation:Rythm- Regular. Murmurs & Other Heart Sounds:Auscultation of the heart reveals- No Murmurs.   Neurologic Cranial Nerve exam:- CN III-XII intact(No  nystagmus), symmetric smile. Strength:- 5/5 equal and symmetric strength both upper and lower extremities.       Assessment & Plan:   Patient Instructions  Prediabetes A1c of 6.1, indicating an average blood glucose of 122. Discussed the importance of lifestyle modifications and continued monitoring. -Continue Metformin. -Check A1c in 3 months.  Hyperlipidemia Elevated total cholesterol, triglycerides, and LDL. Patient was not on medication at the time of the test. -Resume Zetia 10mg  and Fenofibrate 145mg . -Check lipid panel in 3 months.  Vitamin D Deficiency Despite weekly high-dose Vitamin D supplementation, levels remain on the lower end of the normal range. Patient has a family history of decreased bone density. -Increase Vitamin D supplementation to 4000 international units daily. -Check Vitamin D level in 3 months.  Hypertension Losartan 25mg  daily for better control -Check blood pressure daily, particularly in the morning. -Report blood pressure readings in 10 days.  Erectile Dysfunction Patient has been using Sildenafil 20mg . -Continue Sildenafil 20mg  as needed(per rx instruction)  Screening prostate cancer and hx of frequent urination. Patient reports urinating 2-3 times per night. -Order PSA today. -Consider referral to urology if PSA is elevated or symptoms worsen.  Ankle Pain(one event one week ago. Now resolved) Patient reports a previous episode of severe ankle pain. Previous x-ray showed a heel spur but no major arthritic changes. -Consider ordering uric acid level and repeat ankle x-ray if pain recurs.  General Health Maintenance -Referral to [facility] Wellness for weight management. -Check in with patient in early February regarding new insurance and potential prescription benefits.  Follow up 3 months or sooner if needed.   Esperanza Richters, PA-C   Time spent with patient today was 45  minutes which consisted of chart revdiew, discussing diagnosis,  work up treatment and documentation.

## 2023-09-15 LAB — PSA: PSA: 2.83 ng/mL (ref 0.10–4.00)

## 2023-09-17 ENCOUNTER — Encounter: Payer: Self-pay | Admitting: Medical

## 2023-09-17 NOTE — Addendum Note (Signed)
Addended by: Gwenevere Abbot on: 09/17/2023 06:43 AM   Modules accepted: Orders

## 2023-09-19 ENCOUNTER — Encounter (INDEPENDENT_AMBULATORY_CARE_PROVIDER_SITE_OTHER): Payer: Medicare (Managed Care) | Admitting: Family Medicine

## 2023-09-19 ENCOUNTER — Ambulatory Visit: Payer: Medicare Other | Admitting: Family Medicine

## 2023-09-20 ENCOUNTER — Encounter (HOSPITAL_BASED_OUTPATIENT_CLINIC_OR_DEPARTMENT_OTHER): Payer: Medicare (Managed Care)

## 2023-09-22 ENCOUNTER — Emergency Department (HOSPITAL_BASED_OUTPATIENT_CLINIC_OR_DEPARTMENT_OTHER)
Admission: EM | Admit: 2023-09-22 | Discharge: 2023-09-22 | Disposition: A | Discharge status: Home / Self Care | Payer: Medicare (Managed Care) | Attending: Emergency Medicine | Admitting: Emergency Medicine

## 2023-09-22 ENCOUNTER — Emergency Department (HOSPITAL_BASED_OUTPATIENT_CLINIC_OR_DEPARTMENT_OTHER): Payer: Medicare (Managed Care)

## 2023-09-22 ENCOUNTER — Encounter (HOSPITAL_BASED_OUTPATIENT_CLINIC_OR_DEPARTMENT_OTHER): Payer: Self-pay

## 2023-09-22 ENCOUNTER — Ambulatory Visit: Payer: Self-pay | Admitting: Medical

## 2023-09-22 ENCOUNTER — Other Ambulatory Visit: Payer: Self-pay

## 2023-09-22 DIAGNOSIS — K529 Noninfective gastroenteritis and colitis, unspecified: Secondary | ICD-10-CM | POA: Insufficient documentation

## 2023-09-22 DIAGNOSIS — R7303 Prediabetes: Secondary | ICD-10-CM | POA: Diagnosis not present

## 2023-09-22 DIAGNOSIS — Z79899 Other long term (current) drug therapy: Secondary | ICD-10-CM | POA: Insufficient documentation

## 2023-09-22 DIAGNOSIS — Z87891 Personal history of nicotine dependence: Secondary | ICD-10-CM | POA: Insufficient documentation

## 2023-09-22 DIAGNOSIS — Z7901 Long term (current) use of anticoagulants: Secondary | ICD-10-CM | POA: Insufficient documentation

## 2023-09-22 DIAGNOSIS — E785 Hyperlipidemia, unspecified: Secondary | ICD-10-CM | POA: Diagnosis not present

## 2023-09-22 DIAGNOSIS — R0602 Shortness of breath: Secondary | ICD-10-CM | POA: Diagnosis not present

## 2023-09-22 DIAGNOSIS — R059 Cough, unspecified: Secondary | ICD-10-CM | POA: Diagnosis not present

## 2023-09-22 DIAGNOSIS — Z20822 Contact with and (suspected) exposure to covid-19: Secondary | ICD-10-CM | POA: Insufficient documentation

## 2023-09-22 DIAGNOSIS — I4891 Unspecified atrial fibrillation: Secondary | ICD-10-CM | POA: Diagnosis not present

## 2023-09-22 DIAGNOSIS — R1084 Generalized abdominal pain: Secondary | ICD-10-CM | POA: Diagnosis present

## 2023-09-22 DIAGNOSIS — G4730 Sleep apnea, unspecified: Secondary | ICD-10-CM | POA: Diagnosis not present

## 2023-09-22 DIAGNOSIS — R519 Headache, unspecified: Secondary | ICD-10-CM | POA: Diagnosis not present

## 2023-09-22 LAB — CBC
HCT: 49.7 % (ref 39.0–52.0)
Hemoglobin: 16.4 g/dL (ref 13.0–17.0)
MCH: 27.8 pg (ref 26.0–34.0)
MCHC: 33 g/dL (ref 30.0–36.0)
MCV: 84.2 fL (ref 80.0–100.0)
Platelets: 202 10*3/uL (ref 150–400)
RBC: 5.9 MIL/uL — ABNORMAL HIGH (ref 4.22–5.81)
RDW: 14.2 % (ref 11.5–15.5)
WBC: 10.7 10*3/uL — ABNORMAL HIGH (ref 4.0–10.5)
nRBC: 0 % (ref 0.0–0.2)

## 2023-09-22 LAB — COMPREHENSIVE METABOLIC PANEL
ALT: 21 U/L (ref 0–44)
AST: 18 U/L (ref 15–41)
Albumin: 3.9 g/dL (ref 3.5–5.0)
Alkaline Phosphatase: 70 U/L (ref 38–126)
Anion gap: 8 (ref 5–15)
BUN: 25 mg/dL — ABNORMAL HIGH (ref 8–23)
CO2: 25 mmol/L (ref 22–32)
Calcium: 8.8 mg/dL — ABNORMAL LOW (ref 8.9–10.3)
Chloride: 103 mmol/L (ref 98–111)
Creatinine, Ser: 1.16 mg/dL (ref 0.61–1.24)
GFR, Estimated: >60 mL/min (ref >60)
Glucose, Bld: 138 mg/dL — ABNORMAL HIGH (ref 70–99)
Potassium: 4.5 mmol/L (ref 3.5–5.1)
Sodium: 136 mmol/L (ref 135–145)
Total Bilirubin: 0.9 mg/dL (ref 0.0–1.2)
Total Protein: 7.4 g/dL (ref 6.5–8.1)

## 2023-09-22 LAB — URINALYSIS, ROUTINE W REFLEX MICROSCOPIC
Bilirubin Urine: NEGATIVE
Glucose, UA: NEGATIVE mg/dL
Hgb urine dipstick: NEGATIVE
Ketones, ur: NEGATIVE mg/dL
Leukocytes,Ua: NEGATIVE
Nitrite: NEGATIVE
Protein, ur: NEGATIVE mg/dL
Specific Gravity, Urine: 1.015 (ref 1.005–1.030)
pH: 5.5 (ref 5.0–8.0)

## 2023-09-22 LAB — RESP PANEL BY RT-PCR (RSV, FLU A&B, COVID)  RVPGX2
Influenza A by PCR: NEGATIVE
Influenza B by PCR: NEGATIVE
Resp Syncytial Virus by PCR: NEGATIVE
SARS Coronavirus 2 by RT PCR: NEGATIVE

## 2023-09-22 LAB — LIPASE, BLOOD: Lipase: 40 U/L (ref 11–51)

## 2023-09-22 MED ORDER — IOHEXOL 300 MG/ML  SOLN
125.0000 mL | Freq: Once | INTRAMUSCULAR | Status: AC | PRN
  Administered 2023-09-22: 125 mL via INTRAVENOUS

## 2023-09-22 MED ORDER — ALBUTEROL SULFATE HFA 108 (90 BASE) MCG/ACT IN AERS: 2.0000 | INHALATION_SPRAY | RESPIRATORY_TRACT | Status: DC | PRN

## 2023-09-22 MED ORDER — ALBUTEROL SULFATE HFA 108 (90 BASE) MCG/ACT IN AERS
INHALATION_SPRAY | RESPIRATORY_TRACT | Status: AC
  Administered 2023-09-22: 2
  Filled 2023-09-22: qty 6.7

## 2023-09-22 MED ORDER — SODIUM CHLORIDE 0.9 % IV BOLUS
1000.0000 mL | Freq: Once | INTRAVENOUS | Status: AC
  Administered 2023-09-22: 1000 mL via INTRAVENOUS

## 2023-09-22 NOTE — ED Notes (Signed)
Pt. Reports he has had vomiting and diarrhea since he age lastnight.  Pt. In no distress but reports he is thirsty

## 2023-09-22 NOTE — ED Triage Notes (Signed)
Patent arrives with complaints of headache, vomiting/diarrhea, and abdominal pain x1 day. Patient was sent here for further evaluation and IV hydration.  Rates abdominal pain 8/10.

## 2023-09-22 NOTE — ED Notes (Signed)
Patient called to ask about an MDI that he takes PRN at home. Stated he felt like he needed it, spoke with MD and Albuterol to be given.

## 2023-09-22 NOTE — Telephone Encounter (Signed)
Chief Complaint: Vomiting, diarrhea, weakness Symptoms: vomiting, diarrhea, weakness, headache Frequency: since 12 pm yesterday Pertinent Negatives: Patient denies blood in emesis, coffee ground emesis Disposition: [x] ED /[] Urgent Care (no appt availability in office) / [] Appointment(In office/virtual)/ []  [place] [facility]/ [] Home Care/ [] Refused Recommended Disposition /[] [facility]/ []  Follow-up with PCP Additional Notes: Patient called in stating he has been having episodes of vomiting and diarrhea since yesterday at noon. Patient states he has had >6 episodes of both vomiting and diarrhea, stating he has only about 5-10 min breaks before a new episode occurs. Patient states he does not have any bloody or coffee ground emesis. Patient states he is weak, and his mouth is dry. Advised patient to be seen and evaluated in the ED for further evaluation and proper hydration. Patient concerned about going to ED stating he is uncomfortable sitting up, and needs to lay down to feel better. Advised patient he can verbalize this to triage nurse at ED, but highly recommended for evaluation at this time. Patient verbalized understanding and stated [facility] with his wife.   Copied from CRM [id number]. Topic: Clinical - Red Word Triage >> Sep 22, 2023 12:22 PM Geneva B wrote: [facility] that prompted transfer to Nurse Triage: patient is ver weak and barley can talk and very tired and throwing up Reason for Disposition  [1] SEVERE vomiting (e.g., 6 or more times/day) AND [2] present > 8 hours (Exception: Patient sounds well, is drinking liquids, does not sound dehydrated, and vomiting has lasted less than 24 hours.)  Answer Assessment - Initial Assessment Questions 1. VOMITING SEVERITY: "How many times have you vomited in the past 24 hours?"     - MILD:  1 - 2 times/day    - MODERATE: 3 - 5 times/day, decreased oral intake without significant weight loss  or symptoms of dehydration    - SEVERE: 6 or more times/day, vomits everything or nearly everything, with significant weight loss, symptoms of dehydration      6 more  2. ONSET: "When did the vomiting begin?"      Yesterday  3. FLUIDS: "What fluids or food have you vomited up today?" "Have you been able to keep any fluids down?"     Been able to drink water, and ginger ale  4. ABDOMEN PAIN: "Are your having any abdomen pain?" If Yes : "How bad is it and what does it feel like?" (e.g., crampy, dull, intermittent, constant)      Yes, cramping 5. DIARRHEA: "Is there any diarrhea?" If Yes, ask: "How many times today?"      Very frequently, 5-10 6. CONTACTS: "Is there anyone else in the family with the same symptoms?"      No 7. CAUSE: "What do you think is causing your vomiting?"     Unsure of virus and food poisoning 8. HYDRATION STATUS: "Any signs of dehydration?" (e.g., dry mouth [not only dry lips], too weak to stand) "When did you last urinate?"     Weak, dry lips and mouth 9. OTHER SYMPTOMS: "Do you have any other symptoms?" (e.g., fever, headache, vertigo, vomiting blood or coffee grounds, recent head injury)     Weakness, headache  Protocols used: Vomiting-A-AH

## 2023-09-22 NOTE — ED Provider Notes (Addendum)
[facility] Provider Note   CSN: [id number] Arrival date & time: 09/22/23  1300     History  Chief Complaint  Patient presents with   Abdominal Pain   Diarrhea   Emesis    Richard Walker is a 68 y.o. male.  Patient referred in for further evaluation.  Patient with generalized abdominal pain for 1 day associated with vomiting and diarrhea.  Both vomiting and diarrhea has tapered off here some today.  Started yesterday.  Also complaint of headache.  And patient states she started to develop a little bit of a cough.  Rates abdominal pain 8 out of 10.  Patient states no blood in the vomiting or diarrhea.  Past medical history of her hyperlipidemia and gastroesophageal reflux disease history of supraventricular tachycardia prediabetes sleep apnea.  Patient a cardiac catheterization in 2017 SVT ablation at that time.  Patient is a former smoker quit in 1992.  Patient's medications significant for albuterol inhaler flecainide after patient had ablation he did develop atrial fibrillation so he is on that to control that.  Patient on metformin patient on Lopressor.  Patient is on Xarelto.       Home Medications Prior to Admission medications   Medication Sig Start Date End Date Taking? Authorizing Provider  albuterol (PROAIR HFA) 108 (90 Base) MCG/ACT inhaler Inhale 1-2 puffs into the lungs every 6 (six) hours as needed for wheezing or shortness of breath. 03/10/23  Yes Saguier, Ramon Dredge, PA-C  azelastine (ASTELIN) 0.1 % nasal spray 1-2 sprays per nostril 2 times daily as needed. 04/06/21  Yes Coralyn Helling, MD  Budeson-Glycopyrrol-Formoterol (BREZTRI AEROSPHERE) 160-9-4.8 MCG/ACT AERO Inhale 2 puffs into the lungs in the morning and at bedtime. 12/06/22  Yes Coralyn Helling, MD  ezetimibe (ZETIA) 10 MG tablet Take 1 tablet (10 mg total) by mouth daily. 09/14/23  Yes Saguier, Ramon Dredge, PA-C  famotidine-calcium carbonate-magnesium hydroxide (PEPCID COMPLETE)  10-800-165 MG chewable tablet Chew by mouth.   Yes [provider]  fenofibrate (TRICOR) 145 MG tablet Take 1 tablet (145 mg total) by mouth daily. 09/14/23  Yes Saguier, Ramon Dredge, PA-C  flecainide (TAMBOCOR) 150 MG tablet TAKE 1 TABLET(150 MG) BY MOUTH TWICE DAILY 06/06/23  Yes Marinus Maw, MD  losartan (COZAAR) 25 MG tablet Take 1 tablet (25 mg total) by mouth daily. 09/14/23  Yes Saguier, Ramon Dredge, PA-C  metFORMIN (GLUCOPHAGE) 500 MG tablet TAKE 1 TABLET(500 MG) BY MOUTH TWICE DAILY WITH A MEAL 09/14/23  Yes Saguier, Ramon Dredge, PA-C  metoprolol tartrate (LOPRESSOR) 25 MG tablet Take one tablet by mouth twice a day.  May take one additional tablet by mouth as needed for breakthrough palpitations. 06/06/23  Yes Marinus Maw, MD  rivaroxaban (XARELTO) 20 MG TABS tablet TAKE 1 TABLET(20 MG) BY MOUTH DAILY WITH SUPPER 06/27/23  Yes Saguier, Ramon Dredge, PA-C  acetaminophen (TYLENOL) 325 MG tablet Take 2 tablets (650 mg total) by mouth every 6 (six) hours. 07/15/18   Mack Hook, MD  benzonatate (TESSALON) 100 MG capsule Take 1 capsule (100 mg total) by mouth 3 (three) times daily as needed for cough. 04/07/22   Saguier, Ramon Dredge, PA-C  gabapentin (NEURONTIN) 100 MG capsule TAKE 1 CAPSULE(100 MG) BY MOUTH AT BEDTIME 05/24/23   Judi Saa, DO  HYDROcodone-acetaminophen (NORCO) 5-325 MG tablet Take 1 tablet by mouth every 6 (six) hours as needed for moderate pain. 12/09/22   Saguier, Ramon Dredge, PA-C  ibuprofen (ADVIL) 600 MG tablet Take 600 mg by mouth in  the morning and at bedtime.    [provider]  levocetirizine (XYZAL) 5 MG tablet TAKE 1 TABLET(5 MG) BY MOUTH EVERY EVENING 05/23/23   Saguier, Ramon Dredge, PA-C  Magnesium 250 MG TABS Take 1 tablet by mouth once a week.    [provider]  montelukast (SINGULAIR) 10 MG tablet TAKE 1 TABLET BY MOUTH 1 TIME AT NIGHT FOR COUGHING OR WHEEZING 04/06/21   Coralyn Helling, MD  pantoprazole (PROTONIX) 40 MG tablet Take 1 tablet (40 mg total) by mouth  daily. 08/30/22   Saguier, Ramon Dredge, PA-C  predniSONE (DELTASONE) 20 MG tablet Take 1 tablet (20 mg total) by mouth daily with breakfast. 03/17/23   Judi Saa, DO  PRESCRIPTION MEDICATION Inhale into the lungs at bedtime. CPAP    [provider]  Semaglutide-Weight Management (WEGOVY) 0.25 MG/0.5ML SOAJ 0.25 mg weekly injection 03/11/23   Saguier, Ramon Dredge, PA-C  sildenafil (REVATIO) 20 MG tablet 2-5 tab one hour prior to sex. 09/14/23   Saguier, Ramon Dredge, PA-C  Vitamin D, Ergocalciferol, (DRISDOL) 1.25 MG (50000 UNIT) CAPS capsule Take 1 capsule (50,000 Units total) by mouth every 7 (seven) days. 03/10/23   Saguier, Ramon Dredge, PA-C      Allergies    Dust mite extract, Pork-derived products, Statins, and Tree extract    Review of Systems   Review of Systems  Constitutional:  Negative for chills and fever.  HENT:  Negative for ear pain and sore throat.   Eyes:  Negative for pain and visual disturbance.  Respiratory:  Positive for cough. Negative for shortness of breath.   Cardiovascular:  Negative for chest pain and palpitations.  Gastrointestinal:  Positive for abdominal pain, diarrhea, nausea and vomiting.  Genitourinary:  Negative for dysuria and hematuria.  Musculoskeletal:  Negative for arthralgias and back pain.  Skin:  Negative for color change and rash.  Neurological:  Negative for seizures and syncope.  All other systems reviewed and are negative.   Physical Exam Updated Vital Signs BP 131/65   Pulse 65   Temp 98.1 F (36.7 C)   Resp 20   SpO2 98%  Physical Exam Vitals and nursing note reviewed.  Constitutional:      General: He is not in acute distress.    Appearance: Normal appearance. He is well-developed.  HENT:     Head: Normocephalic and atraumatic.  Eyes:     Extraocular Movements: Extraocular movements intact.     Conjunctiva/sclera: Conjunctivae normal.     Pupils: Pupils are equal, round, and reactive to light.  Cardiovascular:     Rate and Rhythm:  Normal rate and regular rhythm.     Heart sounds: No murmur heard. Pulmonary:     Effort: Pulmonary effort is normal. No respiratory distress.     Breath sounds: Normal breath sounds. No wheezing, rhonchi or rales.  Abdominal:     General: There is no distension.     Palpations: Abdomen is soft.     Tenderness: There is abdominal tenderness. There is no guarding.     Comments: Some mild tenderness throughout no guarding.  Musculoskeletal:        General: No swelling.     Cervical back: Normal range of motion and neck supple.  Skin:    General: Skin is warm and dry.     Capillary Refill: Capillary refill takes less than 2 seconds.  Neurological:     General: No focal deficit present.     Mental Status: He is alert and oriented to person,  place, and time.  Psychiatric:        Mood and Affect: Mood normal.     ED Results / Procedures / Treatments   Labs (all labs ordered are listed, but only abnormal results are displayed) Labs Reviewed  COMPREHENSIVE METABOLIC PANEL - Abnormal; Notable for the following components:      Result Value   Glucose, Bld 138 (*)    BUN 25 (*)    Calcium 8.8 (*)    All other components within normal limits  CBC - Abnormal; Notable for the following components:   WBC 10.7 (*)    RBC 5.90 (*)    All other components within normal limits  RESP PANEL BY RT-PCR (RSV, FLU A&B, COVID)  RVPGX2  LIPASE, BLOOD  URINALYSIS, ROUTINE W REFLEX MICROSCOPIC    EKG EKG Interpretation Date/Time:  Thursday September 22 2023 14:11:17 EST Ventricular Rate:  69 PR Interval:  171 QRS Duration:  96 QT Interval:  396 QTC Calculation: 425 R Axis:   -1  Text Interpretation: Sinus rhythm Abnormal R-wave progression, early transition Confirmed by Eber Hong ([id number]) on 09/22/2023 4:41:25 PM  Radiology DG Chest Port 1 View Result Date: 09/22/2023 CLINICAL DATA:  Pain and headache EXAM: PORTABLE CHEST 1 VIEW COMPARISON:  Chest x-ray 10/04/2022 FINDINGS: The heart size  and mediastinal contours are within normal limits. Both lungs are clear. The visualized skeletal structures are unremarkable. IMPRESSION: No active disease. Electronically Signed   By: Darliss Cheney M.D.   On: 09/22/2023 19:36    Procedures Procedures    Medications Ordered in ED Medications  albuterol (VENTOLIN HFA) 108 (90 Base) MCG/ACT inhaler 2 puff (has no administration in time range)  albuterol (VENTOLIN HFA) 108 (90 Base) MCG/ACT inhaler (2 puffs  Given 09/22/23 1811)  sodium chloride 0.9 % bolus 1,000 mL (1,000 mLs Intravenous New Bag/Given 09/22/23 1837)  iohexol (OMNIPAQUE) 300 MG/ML solution 125 mL (125 mLs Intravenous Contrast Given 09/22/23 1850)    ED Course/ Medical Decision Making/ A&P                                 Medical Decision Making Amount and/or Complexity of Data Reviewed Labs: ordered. Radiology: ordered.  Risk Prescription drug management.   Patient's respiratory panel negative for flu COVID RSV.  Lipase normal at 40 complete metabolic panel glucose 138 liver function test normal renal function is normal.  CBC white count 10.7 hemoglobin 6.4 platelets 202.  CT abdomen with contrast is pending.  Chest x-ray no active disease.  EKG without any acute findings.  CT abdomen without any acute findings.  This is all highly suggestive of acute viral gastroenteritis.  Patient overall feeling better.  He does feel as if he is getting a little bit of a cough.  His respiratory panel was negative urinalysis was negative at.  Then chest x-ray no active disease.  Will treat him with Delsym for cough available outpatient.   Final Clinical Impression(s) / ED Diagnoses Final diagnoses:  Generalized abdominal pain  Gastroenteritis    Rx / DC Orders ED Discharge Orders     None         Vanetta Mulders, MD 09/22/23 Corky Crafts    Vanetta Mulders, MD 09/22/23 [phone]

## 2023-09-22 NOTE — Discharge Instructions (Signed)
Recommend Delsym for the cough.  Is a 12-hour cough suppressant.  It is available at all pharmacies.  Would advance your diet to a bland diet.  You may have some increase in some of the diarrhea secondary to that.  Tonight's workup CT scan without any acute findings.  Labs without any acute abnormalities.  And your respiratory panel was negative for COVID flu and RSV.  Follow-up with your doctor as needed.

## 2023-09-24 ENCOUNTER — Other Ambulatory Visit: Payer: Self-pay | Admitting: Medical

## 2023-09-26 NOTE — Progress Notes (Incomplete)
Chief Complaint: No chief complaint on file.   History of Present Illness:  Richard Walker is a 68 y.o. male who is seen in consultation from [place], [place], [place] for evaluation of for evaluation and management of lower urinary tract symptoms.   Past Medical History:  Past Medical History:  Diagnosis Date   DDD (degenerative disc disease), lumbar    L3/L4   Diverticulosis    GERD (gastroesophageal reflux disease)    Hyperlipidemia    Low sperm motility    Pre-diabetes    Sleep apnea    uses cpap    SVT (supraventricular tachycardia) [facility])     Past Surgical History:  Past Surgical History:  Procedure Laterality Date   AMPUTATION Left 07/15/2018   Procedure: . REPAIR OF LEFT INDEX AND LONG FINGER BY FUSION /  REPAIR OF RING FINGER;  Surgeon: Mack Hook, MD;  Location: [facility] OR;  Service: Orthopedics;  Laterality: Left;   BACK SURGERY     CARDIAC CATHETERIZATION N/A 12/22/2015   Procedure: Left Heart Cath and Coronary Angiography;  Surgeon: Peter M [place], MD;  Location: [facility] INVASIVE CV LAB;  Service: Cardiovascular;  Laterality: N/A;   CARDIAC ELECTROPHYSIOLOGY STUDY AND ABLATION     ELECTROPHYSIOLOGIC STUDY N/A 02/19/2016   Procedure: SVT Ablation;  Surgeon: Marinus Maw, MD;  Location: [facility] INVASIVE CV LAB;  Service: Cardiovascular;  Laterality: N/A;   septal reconstruction  2017   TURBINATE REDUCTION Bilateral 07/2016   WISDOM TOOTH EXTRACTION      Allergies:  Allergies  Allergen Reactions   Dust Mite Extract    Pork-Derived Products Swelling   Statins     Muscle aches   Tree Extract Other (See Comments)    Intolerance     Family History:  Family History  Problem Relation Age of Onset   Hypertension Father    Food Allergy Son    Colon cancer Neg Hx    Stomach cancer Neg Hx    Allergic rhinitis Neg Hx    Angioedema Neg Hx    Asthma Neg Hx    Eczema Neg Hx    Immunodeficiency Neg Hx    Urticaria Neg Hx     Social History:  Social History    Tobacco Use   Smoking status: Former    Current packs/day: 0.00    Average packs/day: 1 pack/day for 10.0 years (10.0 ttl pk-yrs)    Types: Cigarettes    Start date: 24    Quit date: 4    Years since quitting: 33.1   Smokeless tobacco: Never   Tobacco comments:    Uses Hookah Pipe occasionally  Vaping Use   Vaping status: Never Used  Substance Use Topics   Alcohol use: No   Drug use: No    Review of symptoms:  Constitutional:  Negative for unexplained weight loss, night sweats, fever, chills ENT:  Negative for nose bleeds, sinus pain, painful swallowing CV:  Negative for chest pain, shortness of breath, exercise intolerance, palpitations, loss of consciousness Resp:  Negative for cough, wheezing, shortness of breath GI:  Negative for nausea, vomiting, diarrhea, bloody stools GU:  Positives noted in HPI; otherwise negative for gross hematuria, dysuria, urinary incontinence Neuro:  Negative for seizures, poor balance, limb weakness, slurred speech Psych:  Negative for lack of energy, depression, anxiety Endocrine:  Negative for polydipsia, polyuria, symptoms of hypoglycemia (dizziness, hunger, sweating) Hematologic:  Negative for anemia, purpura, petechia, prolonged or excessive bleeding, use of anticoagulants  Allergic:  Negative for difficulty breathing or choking as a result of exposure to anything; no shellfish allergy; no allergic response (rash/itch) to materials, foods  Physical exam: There were no vitals taken for this visit. GENERAL APPEARANCE:  Well appearing, well developed, well nourished, NAD HEENT: Atraumatic, Normocephalic. NECK: Normal appearance LUNGS: Normal inspiratory and expiratory excursion HEART: Regular Rate ABDOMEN: ***. GU: Phallus normal, no lesions. Scrotal skin normal. Testicles/epididymal structures normal. Meatus normal. Normal anal sphincter tone, prostate ***mL, symmetric, non nodular, non tender. EXTREMITIES: Moves all extremities well.   Without clubbing, cyanosis, or edema. NEUROLOGIC:  Alert and oriented x 3, normal gait, CN II-XII grossly intact.  MENTAL STATUS:  Appropriate. SKIN:  Warm, dry and intact.    Results: No results found for this or any previous visit (from the past 24 hours).  I have reviewed referring/prior physicians notes  I have reviewed urinalysis  I have reviewed PSA results  I have reviewed prior imaging  I have reviewed urine culture results  Assessment: ***   Plan: ***

## 2023-10-04 ENCOUNTER — Other Ambulatory Visit (HOSPITAL_BASED_OUTPATIENT_CLINIC_OR_DEPARTMENT_OTHER): Payer: Self-pay

## 2023-10-04 ENCOUNTER — Ambulatory Visit: Payer: Medicare Other | Admitting: Family Medicine

## 2023-10-04 DIAGNOSIS — R0609 Other forms of dyspnea: Secondary | ICD-10-CM

## 2023-10-04 DIAGNOSIS — J4550 Severe persistent asthma, uncomplicated: Secondary | ICD-10-CM

## 2023-10-05 ENCOUNTER — Encounter (INDEPENDENT_AMBULATORY_CARE_PROVIDER_SITE_OTHER): Payer: Self-pay | Admitting: Adult Health

## 2023-10-05 ENCOUNTER — Ambulatory Visit (INDEPENDENT_AMBULATORY_CARE_PROVIDER_SITE_OTHER): Payer: Medicare (Managed Care) | Admitting: Adult Health

## 2023-10-05 VITALS — BP 134/86 | HR 61 | Temp 98.0°F | Ht 69.0 in | Wt 257.0 lb

## 2023-10-05 DIAGNOSIS — E785 Hyperlipidemia, unspecified: Secondary | ICD-10-CM

## 2023-10-05 DIAGNOSIS — E559 Vitamin D deficiency, unspecified: Secondary | ICD-10-CM

## 2023-10-05 DIAGNOSIS — Z0289 Encounter for other administrative examinations: Secondary | ICD-10-CM

## 2023-10-05 DIAGNOSIS — I10 Essential (primary) hypertension: Secondary | ICD-10-CM | POA: Diagnosis not present

## 2023-10-05 DIAGNOSIS — Z6838 Body mass index (BMI) 38.0-38.9, adult: Secondary | ICD-10-CM

## 2023-10-05 NOTE — Progress Notes (Signed)
Office: [phone]  /  Fax: [phone]   Initial Visit  Richard Walker was seen in clinic today to evaluate for obesity. He is interested in losing weight to improve overall health and reduce the risk of weight related complications. He presents today to review program treatment options, initial physical assessment, and evaluation.     He was referred by: Friend or Family  When asked what else they would like to accomplish? He states: Adopt healthier eating patterns, Improve energy levels and physical activity, Improve existing medical conditions, Reduce number of medications, and Improve quality of life  Weight history: He reports steady weight gain since he retired in 2018.  When working he would stand/walk 8-12 hours.  He has been quite sedentary since retirement.  When asked how has your weight affected you? He states: Contributed to medical problems, Contributed to orthopedic problems or mobility issues, Having fatigue, Having poor endurance, and Problems with eating patterns  Some associated conditions: Hypertension, Hyperlipidemia, OSA, and Vitamin D Deficiency  Contributing factors: Disruption of circadian rhythm / sleep disordered breathing, Consumption of processed foods, Use of obesogenic medications: Beta-blockers, Psychotropic medications, and Steroids, Reduced physical activity, and Eating patterns  Weight promoting medications identified: Beta-blockers and Steroids  Current nutrition plan: None  Current level of physical activity: None  Current or previous pharmacotherapy: Is interested in pharmacotherapy  Response to medication:  PCP attempted [facility] therapy- insurance denied.  He is very interested in GLP-1 or GIP/GLP-1 therapy.   Past medical history includes:   Past Medical History:  Diagnosis Date   DDD (degenerative disc disease), lumbar    L3/L4   Diverticulosis    GERD (gastroesophageal reflux disease)    Hyperlipidemia    Low sperm motility     Pre-diabetes    Sleep apnea    uses cpap    SVT (supraventricular tachycardia) (HCC)      Objective:   BP 134/86   Pulse 61   Temp 98 F (36.7 C)   Ht 5\' 9"  (1.753 m)   Wt 257 lb (116.6 kg)   SpO2 98%   BMI 37.95 kg/m  He was weighed on the bioimpedance scale: Body mass index is 37.95 kg/m.  Peak Weight:270 , Body Fat%:33.6, Visceral Fat Rating:22, Weight trend over the last 12 months: Increasing  General:  Alert, oriented and cooperative. Patient is in no acute distress.  Respiratory: Normal respiratory effort, no problems with respiration noted   Gait: able to ambulate independently  Mental Status: Normal mood and affect. Normal behavior. Normal judgment and thought content.   DIAGNOSTIC DATA REVIEWED:  BMET    Component Value Date/Time   NA 136 09/22/2023 1405   K 4.5 09/22/2023 1405   CL 103 09/22/2023 1405   CO2 25 09/22/2023 1405   GLUCOSE 138 (H) 09/22/2023 1405   BUN 25 (H) 09/22/2023 1405   CREATININE 1.16 09/22/2023 1405   CREATININE 1.18 02/12/2016 1508   CALCIUM 8.8 (L) 09/22/2023 1405   GFRNONAA >60 09/22/2023 1405   GFRNONAA >89 03/04/2014 1456   GFRAA >60 07/15/2018 1324   GFRAA >89 03/04/2014 1456   Lab Results  Component Value Date   HGBA1C 6.1 09/12/2023   HGBA1C 5.7 12/24/2015   No results found for: "INSULIN" CBC    Component Value Date/Time   WBC 10.7 (H) 09/22/2023 1405   RBC 5.90 (H) 09/22/2023 1405   HGB 16.4 09/22/2023 1405   HCT 49.7 09/22/2023 1405   PLT 202 09/22/2023 1405   MCV  84.2 09/22/2023 1405   MCH 27.8 09/22/2023 1405   MCHC 33.0 09/22/2023 1405   RDW 14.2 09/22/2023 1405   Iron/TIBC/Ferritin/ %Sat    Component Value Date/Time   IRON 69 06/28/2018 1611   Lipid Panel     Component Value Date/Time   CHOL 248 (H) 09/12/2023 1325   TRIG 195.0 (H) 09/12/2023 1325   HDL 41.50 09/12/2023 1325   CHOLHDL 6 09/12/2023 1325   VLDL 39.0 09/12/2023 1325   LDLCALC 168 (H) 09/12/2023 1325   LDLDIRECT 150.0 03/10/2023  0951   Hepatic Function Panel     Component Value Date/Time   PROT 7.4 09/22/2023 1405   ALBUMIN 3.9 09/22/2023 1405   AST 18 09/22/2023 1405   ALT 21 09/22/2023 1405   ALKPHOS 70 09/22/2023 1405   BILITOT 0.9 09/22/2023 1405   BILIDIR 0.1 01/06/2017 1600      Component Value Date/Time   TSH 2.04 03/10/2023 0951     Assessment and Plan:   Hyperlipidemia, unspecified hyperlipidemia type  Vitamin D deficiency  Hypertension, unspecified type  Morbid obesity (HCC), Starting BMI 38.0  ESTABLISH WITH HWW   Obesity Treatment / Action Plan:  Patient will work on garnering support from family and friends to begin weight loss journey. Will work on eliminating or reducing the presence of highly palatable, calorie dense foods in the home. Will complete provided nutritional and psychosocial assessment questionnaire before the next appointment. Will be scheduled for indirect calorimetry to determine resting energy expenditure in a fasting state.  This will allow [place] to create a reduced calorie, high-protein meal plan to promote loss of fat mass while preserving muscle mass. Counseled on the health benefits of losing 5%-15% of total body weight. Was counseled on nutritional approaches to weight loss and benefits of reducing processed foods and consuming plant-based foods and high quality protein as part of nutritional weight management. Was counseled on pharmacotherapy and role as an adjunct in weight management.   Obesity Education Performed Today:  He was weighed on the bioimpedance scale and results were discussed and documented in the synopsis.  We discussed obesity as a disease and the importance of a more detailed evaluation of all the factors contributing to the disease.  We discussed the importance of long term lifestyle changes which include nutrition, exercise and behavioral modifications as well as the importance of customizing this to his specific health and social  needs.  We discussed the benefits of reaching a healthier weight to alleviate the symptoms of existing conditions and reduce the risks of the biomechanical, metabolic and psychological effects of obesity.  Richard Walker appears to be in the action stage of change and states they are ready to start intensive lifestyle modifications and behavioral modifications.  30 minutes was spent today on this visit including the above counseling, pre-visit chart review, and post-visit documentation.  Reviewed by clinician on day of visit: allergies, medications, problem list, medical history, surgical history, family history, social history, and previous encounter notes pertinent to obesity diagnosis.  Konstantina Nachreiner d. Zaylia Riolo, NP-C

## 2023-10-06 ENCOUNTER — Ambulatory Visit (INDEPENDENT_AMBULATORY_CARE_PROVIDER_SITE_OTHER): Payer: Medicare (Managed Care) | Admitting: Pulmonary Disease

## 2023-10-06 DIAGNOSIS — J4550 Severe persistent asthma, uncomplicated: Secondary | ICD-10-CM | POA: Diagnosis not present

## 2023-10-06 DIAGNOSIS — R0609 Other forms of dyspnea: Secondary | ICD-10-CM

## 2023-10-06 LAB — PULMONARY FUNCTION TEST
DL/VA % pred: 119 %
DL/VA: 4.91 ml/min/mmHg/L
DLCO cor % pred: 100 %
DLCO cor: 25.28 ml/min/mmHg
DLCO unc % pred: 104 %
DLCO unc: 26.47 ml/min/mmHg
FEF 25-75 Post: 2.47 L/s
FEF 25-75 Pre: 2.68 L/s
FEF2575-%Change-Post: -7 %
FEF2575-%Pred-Post: 99 %
FEF2575-%Pred-Pre: 107 %
FEV1-%Change-Post: -2 %
FEV1-%Pred-Post: 80 %
FEV1-%Pred-Pre: 82 %
FEV1-Post: 2.57 L
FEV1-Pre: 2.63 L
FEV1FVC-%Change-Post: -2 %
FEV1FVC-%Pred-Pre: 110 %
FEV6-%Change-Post: 0 %
FEV6-%Pred-Post: 79 %
FEV6-%Pred-Pre: 79 %
FEV6-Post: 3.21 L
FEV6-Pre: 3.21 L
FEV6FVC-%Pred-Post: 105 %
FEV6FVC-%Pred-Pre: 105 %
FVC-%Change-Post: 0 %
FVC-%Pred-Post: 74 %
FVC-%Pred-Pre: 74 %
FVC-Post: 3.21 L
FVC-Pre: 3.21 L
Post FEV1/FVC ratio: 80 %
Post FEV6/FVC ratio: 100 %
Pre FEV1/FVC ratio: 82 %
Pre FEV6/FVC Ratio: 100 %
RV % pred: 94 %
RV: 2.17 L
TLC % pred: 93 %
TLC: 6.26 L

## 2023-10-06 NOTE — Progress Notes (Signed)
Full PFT Performed Today

## 2023-10-06 NOTE — Patient Instructions (Signed)
Full PFT Performed Today

## 2023-10-10 ENCOUNTER — Ambulatory Visit: Payer: Medicare (Managed Care) | Admitting: Urology

## 2023-10-10 DIAGNOSIS — R351 Nocturia: Secondary | ICD-10-CM

## 2023-10-10 DIAGNOSIS — N401 Enlarged prostate with lower urinary tract symptoms: Secondary | ICD-10-CM

## 2023-10-11 ENCOUNTER — Encounter (HOSPITAL_BASED_OUTPATIENT_CLINIC_OR_DEPARTMENT_OTHER): Payer: Self-pay | Admitting: Pulmonary Disease

## 2023-10-11 ENCOUNTER — Ambulatory Visit (HOSPITAL_BASED_OUTPATIENT_CLINIC_OR_DEPARTMENT_OTHER): Payer: Medicare (Managed Care) | Admitting: Pulmonary Disease

## 2023-10-11 VITALS — BP 122/80 | HR 64 | Ht 68.5 in | Wt 260.9 lb

## 2023-10-11 DIAGNOSIS — G4733 Obstructive sleep apnea (adult) (pediatric): Secondary | ICD-10-CM | POA: Diagnosis not present

## 2023-10-11 DIAGNOSIS — J4540 Moderate persistent asthma, uncomplicated: Secondary | ICD-10-CM

## 2023-10-11 DIAGNOSIS — J4550 Severe persistent asthma, uncomplicated: Secondary | ICD-10-CM

## 2023-10-11 MED ORDER — AEROCHAMBER MV MISC: 0 refills | Status: DC

## 2023-10-11 MED ORDER — ALBUTEROL SULFATE HFA 108 (90 BASE) MCG/ACT IN AERS: 1.0000 | INHALATION_SPRAY | Freq: Four times a day (QID) | RESPIRATORY_TRACT | 3 refills | Status: AC | PRN

## 2023-10-11 MED ORDER — BREZTRI AEROSPHERE 160-9-4.8 MCG/ACT IN AERO: 2.0000 | INHALATION_SPRAY | Freq: Two times a day (BID) | RESPIRATORY_TRACT | 11 refills | Status: DC

## 2023-10-11 NOTE — Patient Instructions (Signed)
Moderate persistent asthma Chronic bronchitis --CONTINUE Breztri TWO puffs in the morning and morning. Rinse mouth after use  >Will ORDER spacer --Reviewed pulmonary function tests. Normal --Followed by Dr. Nunzio Cobbs with Allergy  Severe OSA --Managed by Dr. Vickey Huger with [facility] --Encourage to discuss SOB related to uncontrolled OSA and consider PAP titration

## 2023-10-11 NOTE — Progress Notes (Signed)
 Subjective:   PATIENT ID: Richard Walker GENDER: male DOB: 1956-04-01, MRN: [id number]  Chief Complaint  Patient presents with   Follow-up    Asthma    Reason for Visit: New patient to me, former Dr. Craige Cotta  Richard Walker is a 68 year old male with asthma, OSA, SVT, atrial fibrillation who presents for follow-up for asthma.  Compliant with Breztri. Uses albuterol 2-3 times a day and sometimes more with activity. Unsure if he is using albuterol due to lung related symptoms.No cough or wheezing. Reports fatigue and shortness of breath with movement. Limited by knee pain as well. He knows he needs to lose weight. Planning to join programs to help lose weight. Previously active with sports but not since 2018.  I have personally reviewed patient's past medical/family/social history, allergies, current medications.  Past Medical History:  Diagnosis Date   DDD (degenerative disc disease), lumbar    L3/L4   Diverticulosis    GERD (gastroesophageal reflux disease)    Hyperlipidemia    Low sperm motility    Pre-diabetes    Sleep apnea    uses cpap    SVT (supraventricular tachycardia) (HCC)      Family History  Problem Relation Age of Onset   Hypertension Father    Food Allergy Son    Colon cancer Neg Hx    Stomach cancer Neg Hx    Allergic rhinitis Neg Hx    Angioedema Neg Hx    Asthma Neg Hx    Eczema Neg Hx    Immunodeficiency Neg Hx    Urticaria Neg Hx      Social History   Occupational History   Not on file  Tobacco Use   Smoking status: Former    Current packs/day: 0.00    Average packs/day: 1 pack/day for 10.0 years (10.0 ttl pk-yrs)    Types: Cigarettes    Start date: 34    Quit date: 13    Years since quitting: 33.1   Smokeless tobacco: Never   Tobacco comments:    Uses Hookah Pipe occasionally  Vaping Use   Vaping status: Never Used  Substance and Sexual Activity   Alcohol use: No   Drug use: No   Sexual activity: Yes    Allergies  Allergen  Reactions   Dust Mite Extract    Pork-Derived Products Swelling   Statins     Muscle aches   Tree Extract Other (See Comments)    Intolerance      Outpatient Medications Prior to Visit  Medication Sig Dispense Refill   acetaminophen (TYLENOL) 325 MG tablet Take 2 tablets (650 mg total) by mouth every 6 (six) hours.     azelastine (ASTELIN) 0.1 % nasal spray 1-2 sprays per nostril 2 times daily as needed. 90 mL 3   benzonatate (TESSALON) 100 MG capsule Take 1 capsule (100 mg total) by mouth 3 (three) times daily as needed for cough. 30 capsule 0   ezetimibe (ZETIA) 10 MG tablet Take 1 tablet (10 mg total) by mouth daily. 90 tablet 3   famotidine-calcium carbonate-magnesium hydroxide (PEPCID COMPLETE) 10-800-165 MG chewable tablet Chew by mouth.     fenofibrate (TRICOR) 145 MG tablet Take 1 tablet (145 mg total) by mouth daily. 90 tablet 3   flecainide (TAMBOCOR) 150 MG tablet TAKE 1 TABLET(150 MG) BY MOUTH TWICE DAILY 180 tablet 3   gabapentin (NEURONTIN) 100 MG capsule TAKE 1 CAPSULE(100 MG) BY MOUTH Richard Walker BEDTIME 90 capsule 0  HYDROcodone-acetaminophen (NORCO) 5-325 MG tablet Take 1 tablet by mouth every 6 (six) hours as needed for moderate pain. 20 tablet 0   ibuprofen (ADVIL) 600 MG tablet Take 600 mg by mouth in the morning and Richard Walker bedtime.     levocetirizine (XYZAL) 5 MG tablet TAKE 1 TABLET(5 MG) BY MOUTH EVERY EVENING 30 tablet 3   losartan (COZAAR) 25 MG tablet Take 1 tablet (25 mg total) by mouth daily. 90 tablet 3   Magnesium 250 MG TABS Take 1 tablet by mouth once a week.     metFORMIN (GLUCOPHAGE) 500 MG tablet TAKE 1 TABLET(500 MG) BY MOUTH TWICE DAILY WITH A MEAL 180 tablet 3   metoprolol tartrate (LOPRESSOR) 25 MG tablet Take one tablet by mouth twice a day.  May take one additional tablet by mouth as needed for breakthrough palpitations. 270 tablet 3   montelukast (SINGULAIR) 10 MG tablet TAKE 1 TABLET BY MOUTH 1 TIME Richard Walker NIGHT FOR COUGHING OR WHEEZING 90 tablet 3    pantoprazole (PROTONIX) 40 MG tablet Take 1 tablet (40 mg total) by mouth daily. 30 tablet 11   predniSONE (DELTASONE) 20 MG tablet Take 1 tablet (20 mg total) by mouth daily with breakfast. 7 tablet 0   PRESCRIPTION MEDICATION Inhale into the lungs Richard Walker bedtime. CPAP     Semaglutide-Weight Management (WEGOVY) 0.25 MG/0.5ML SOAJ 0.25 mg weekly injection 2 mL 0   sildenafil (REVATIO) 20 MG tablet 2-5 tab one hour prior to sex. 40 tablet 0   Vitamin D, Ergocalciferol, (DRISDOL) 1.25 MG (50000 UNIT) CAPS capsule Take 1 capsule (50,000 Units total) by mouth every 7 (seven) days. 8 capsule 3   XARELTO 20 MG TABS tablet TAKE 1 TABLET(20 MG) BY MOUTH DAILY WITH SUPPER 90 tablet 0   albuterol (PROAIR HFA) 108 (90 Base) MCG/ACT inhaler Inhale 1-2 puffs into the lungs every 6 (six) hours as needed for wheezing or shortness of breath. 24 g 3   Budeson-Glycopyrrol-Formoterol (BREZTRI AEROSPHERE) 160-9-4.8 MCG/ACT AERO Inhale 2 puffs into the lungs in the morning and Richard Walker bedtime. 5.9 g 5   No facility-administered medications prior to visit.    Review of Systems  Constitutional:  Positive for malaise/fatigue. Negative for chills, diaphoresis, fever and weight loss.  HENT:  Negative for congestion.   Respiratory:  Positive for shortness of breath. Negative for cough, hemoptysis, sputum production and wheezing.   Cardiovascular:  Negative for chest pain, palpitations and leg swelling.     Objective:   Vitals:   10/11/23 1124  BP: 122/80  Pulse: 64  SpO2: 97%  Weight: 260 lb 14.4 oz (118.3 kg)  Height: 5' 8.5" (1.74 m)   SpO2: 97 %  Physical Exam: General: Well-appearing, no acute distress HENT: Richard Walker, Richard Walker Eyes: EOMI, no scleral icterus Respiratory: Clear to auscultation bilaterally.  No crackles, wheezing or rales Cardiovascular: RRR, -M/R/G, no JVD Extremities:-Edema,-tenderness Neuro: AAO x4, CNII-XII grossly intact Psych: Normal mood, normal affect  Data Reviewed:  Imaging: CT Chest  07/16/20 - Overall normal parenchymal except for small thin walled cyst in RML and mild chronic scarring in lingula  PFT: 10/06/23 FVC 3.21 (74%) FEV1 2.57 (80%) Ratio 80  TLC 93% DLCO 104%. No significant BD response Interpretation: Normal PFTs. N   Labs: CBC    Component Value Date/Time   WBC 10.7 (H) 09/22/2023 1405   RBC 5.90 (H) 09/22/2023 1405   HGB 16.4 09/22/2023 1405   HCT 49.7 09/22/2023 1405   PLT 202 09/22/2023 1405   MCV  84.2 09/22/2023 1405   MCH 27.8 09/22/2023 1405   MCHC 33.0 09/22/2023 1405   RDW 14.2 09/22/2023 1405   LYMPHSABS 1.9 09/12/2023 1325   MONOABS 0.4 09/12/2023 1325   EOSABS 0.1 09/12/2023 1325   BASOSABS 0.1 09/12/2023 1325        Assessment & Plan:   Discussion: 68 year old male with asthma, OSA, SVT, atrial fibrillation who presents for follow-up for asthma. Well controlled. Discussed clinical course and management of asthma including bronchodilator regimen, preventive care and action plan for exacerbation.   Moderate persistent asthma Chronic bronchitis --CONTINUE Breztri TWO puffs in the morning and morning. Rinse mouth after use  >Will ORDER spacer --Reviewed pulmonary function tests. Normal --Followed by Dr. Nunzio Cobbs with Allergy  Severe OSA --Managed by Dr. Vickey Huger with [facility] --Encourage to discuss SOB related to uncontrolled OSA and consider PAP titration  Health Maintenance Immunization History  Administered Date(s) Administered   Fluad Quad(high Dose 65+) 07/29/2021, 09/13/2022   Influenza Split 05/23/2017   Influenza,inj,Quad PF,6+ Mos 09/18/2018, 08/14/2020   Influenza,inj,Quad PF,6-35 Mos 09/18/2018, 08/14/2020   Influenza,inj,quad, With Preservative 05/23/2017   Influenza-Unspecified 05/23/2017, 09/19/2018   PFIZER(Purple Top)SARS-COV-2 Vaccination 10/02/2020, 11/04/2020   PNEUMOCOCCAL CONJUGATE-20 09/13/2022   PPD Test 02/26/2015   Pneumococcal Polysaccharide-23 10/24/2017   Td 09/15/2001   Td  (Adult),5 Lf Tetanus Toxid, Preservative Free 09/15/2001   Tdap 06/16/2007, 10/24/2017, 01/21/2021   Zoster Recombinant(Shingrix) 10/15/2020, 03/02/2021   Zoster, Live 04/19/2012   CT Lung Screen - review Richard Walker next visit  No orders of the defined types were placed in this encounter.  Meds ordered this encounter  Medications   Budeson-Glycopyrrol-Formoterol (BREZTRI AEROSPHERE) 160-9-4.8 MCG/ACT AERO    Sig: Inhale 2 puffs into the lungs in the morning and Richard Walker bedtime.    Dispense:  10.7 g    Refill:  11    Manufacturer?:   [employer] [[id number]]   albuterol (PROAIR HFA) 108 (90 Base) MCG/ACT inhaler    Sig: Inhale 1-2 puffs into the lungs every 6 (six) hours as needed for wheezing or shortness of breath.    Dispense:  24 g    Refill:  3    Return in about 6 months (around 04/09/2024).  I have spent a total time of 34-minutes on the day of the appointment reviewing prior documentation, coordinating care and discussing medical diagnosis and plan with the patient/family. Imaging, labs and tests included in this note have been reviewed and interpreted independently by me.  Kerrick Miler Mechele Collin, MD [facility] 10/11/2023 11:50 AM

## 2023-10-18 ENCOUNTER — Other Ambulatory Visit: Payer: Self-pay | Admitting: Family Medicine

## 2023-10-21 ENCOUNTER — Telehealth: Payer: Self-pay | Admitting: Medical

## 2023-10-21 ENCOUNTER — Other Ambulatory Visit: Payer: Self-pay | Admitting: Medical

## 2023-10-21 MED ORDER — PANTOPRAZOLE SODIUM 40 MG PO TBEC
40.0000 mg | DELAYED_RELEASE_TABLET | Freq: Every day | ORAL | 2 refills | Status: DC
  Filled 2024-05-31 – 2024-07-02 (×2): qty 30, 30d supply, fill #0
  Filled 2024-08-14: qty 30, 30d supply, fill #1
  Filled 2024-08-29 (×2): qty 60, 60d supply, fill #1

## 2023-10-21 NOTE — Telephone Encounter (Signed)
 Rx sent

## 2023-10-21 NOTE — Telephone Encounter (Signed)
 Pt needs pantoprazole 40 mg called in to the [facility]  at [address] [place] [address]. Patient has bee out for a week.

## 2023-10-26 ENCOUNTER — Ambulatory Visit: Payer: Medicare HMO | Admitting: Adult Health

## 2023-10-27 ENCOUNTER — Encounter: Payer: Self-pay | Admitting: Neurology

## 2023-10-27 ENCOUNTER — Ambulatory Visit: Payer: Medicare (Managed Care) | Admitting: Neurology

## 2023-10-27 ENCOUNTER — Encounter: Payer: Self-pay | Admitting: *Deleted

## 2023-10-27 VITALS — BP 125/73 | HR 55 | Ht 69.0 in | Wt 266.0 lb

## 2023-10-27 DIAGNOSIS — G4733 Obstructive sleep apnea (adult) (pediatric): Secondary | ICD-10-CM | POA: Diagnosis not present

## 2023-10-27 DIAGNOSIS — R0609 Other forms of dyspnea: Secondary | ICD-10-CM

## 2023-10-27 DIAGNOSIS — I480 Paroxysmal atrial fibrillation: Secondary | ICD-10-CM | POA: Diagnosis not present

## 2023-10-27 DIAGNOSIS — R5383 Other fatigue: Secondary | ICD-10-CM

## 2023-10-27 NOTE — Progress Notes (Signed)
 Provider:  Melvyn Novas, MD  Primary Care Physician:  Marisue Brooklyn [address] [facility] [address] [place] [address]     Referring Provider: Esperanza Richters, Pa-c [address] [place],  [place] [address]          Chief Complaint according to patient   Patient presents with:                HISTORY OF PRESENT ILLNESS:  Richard Walker is a 68 y.o. male patient who is here for revisit 10/27/2023 for  CPAP follow up- increasing fatigue, SOB, eyes feel tired.  Could it be poorly controlled OSA ? Could it be cardiac ? He reports he is now CPAP dependent, he sleeps so poorly without CPAP. Has a 100$% compliance  report , residual AHI 0.1/h. Could he have hypoxia under well controlled apnea.  Ordering ONO  for the patient - he asked  for HP location.   Chief concern according to patient :  Fatigue is  severe: My sugar is up, my BUN was high, and my pulmonologist is concerned about apnea not being treated optimally.   Richard Walker is a 68 year old male with asthma, OSA, SVT, atrial fibrillation who presents for follow-up for asthma.   Compliant with Breztri. Uses albuterol 2-3 times a day and sometimes more with activity. Unsure if he is using albuterol due to lung related symptoms.No cough or wheezing. Reports fatigue and shortness of breath with movement. Limited by knee pain as well. He knows he needs to lose weight. Planning to join programs to help lose weight. Previously active with sports but not since 2018. PFT: 10/06/23 FVC 3.21 (74%) FEV1 2.57 (80%) Ratio 80    TLC 93% DLCO 104%. No significant BD response Interpretation: Normal PFTs.  Severe OSA --Managed by Dr. Vickey Huger with [facility] --Encourage to discuss SOB related to uncontrolled  (?) OSA and consider PAP titration:  Chi Mechele Collin, MD [facility]    08/01/2019  Richard Walker is a 68 year old male with a history of obstructive sleep apnea on CPAP.   He states that he has had a lot change in medical history, is experiencing exhaustion and fatigue all the time. He finds that he has to take a nap every day. He had Covid in August but was minor. He has concerns of dry mouth/waking up frequently during the night. biotene has helped some.  Richard Walker reports that before he contracted Covid he already felt an increasing amount of fatigue and exhaustion but it has worsened with the infection.  At this point the patient's Epworth Sleepiness Scale was endorsed at a high level= 18 out of 24 possible points and a narcoleptic level. The fatigue also affects him at 63 out of 63 possible points.  He reports frontal headaches, fragmented sleep, he is out of job ( at [employer] after 23 years) and can't even do activities around the house. Had SVT and Ablation- 2017- now  Atrial fib relapses, was not on anticoagulation and started.  Started metoprolol. Heart monitor found persistent a fib. He has a sore , dry throat and is always achy. Started fleccanide. Dr Alberteen Collins did CMET,TSH and Vit D after she found him Covid positive.  Those numbers looked (good reportedly).     Review of Systems: Out of a complete 14 system review, the patient complains of only the following symptoms, and all other  reviewed systems are negative.:   Social History   Socioeconomic History   Marital status: Married    Spouse name: Not on file   Number of children: Not on file   Years of education: Not on file   Highest education level: Associate degree: occupational, Scientist, product/process development, or vocational program  Occupational History   Not on file  Tobacco Use   Smoking status: Former    Current packs/day: 0.00    Average packs/day: 1 pack/day for 10.0 years (10.0 ttl pk-yrs)    Types: Cigarettes    Start date: 66    Quit date: 41    Years since quitting: 33.2   Smokeless tobacco: Never   Tobacco comments:    Uses Hookah Pipe occasionally  Vaping Use   Vaping status: Never Used   Substance and Sexual Activity   Alcohol use: No   Drug use: No   Sexual activity: Yes  Other Topics Concern   Not on file  Social History Narrative   Marital Status: Married (Feryal)Children:  4 (3 Sons/1 Daughter)Pets:  None Living Situation: Lives with spouse and 4 childrenOrigin:  He was born in [place].Occupation: Production designer, 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  Alcohol Use:  NoneDrug Use:  NoneDiet:  RegularExercise:  NoneHobbies:  Cards   Retired    Teacher, early years/pre Strain: High Risk (11/24/2022)   Overall Financial Resource Strain (CARDIA)    Difficulty of Paying Living Expenses: Very hard  Food Insecurity: Food Insecurity Present (11/24/2022)   Hunger Vital Sign    Worried About Running Out of Food in the Last Year: Often true    Ran Out of Food in the Last Year: Sometimes true  Transportation Needs: Patient Declined (11/24/2022)   PRAPARE - Administrator, Civil Service (Medical): Patient declined    Lack of Transportation (Non-Medical): Patient declined  Physical Activity: Inactive (12/23/2021)   Exercise Vital Sign    Days of Exercise per Week: 0 days    Minutes of Exercise per Session: 0 min  Stress: No Stress Concern Present (12/23/2021)   [employer] of Occupational Health - Occupational Stress Questionnaire    Feeling of Stress : Not at all  Social Connections: Socially Integrated (11/24/2022)   Social Connection and Isolation Panel [NHANES]    Frequency of Communication with Friends and Family: More than three times a week    Frequency of Social Gatherings with Friends and Family: Twice a week    Attends Religious Services: More than 4 times per year    Active Member of [employer] or Organizations: Yes    Attends Banker Meetings: Patient declined    Marital Status: Married    Family History  Problem Relation  Age of Onset   Hypertension Father    Food Allergy Son    Colon cancer Neg Hx    Stomach cancer Neg Hx    Allergic rhinitis Neg Hx    Angioedema Neg Hx    Asthma Neg Hx    Eczema Neg Hx    Immunodeficiency Neg Hx    Urticaria Neg Hx    Sleep apnea Neg Hx     Past Medical History:  Diagnosis Date   DDD (degenerative disc disease), lumbar    L3/L4   Diverticulosis    GERD (gastroesophageal reflux disease)    Hyperlipidemia    Low sperm motility    Pre-diabetes  Sleep apnea    uses cpap    SVT (supraventricular tachycardia) [facility])     Past Surgical History:  Procedure Laterality Date   AMPUTATION Left 07/15/2018   Procedure: . REPAIR OF LEFT INDEX AND LONG FINGER BY FUSION /  REPAIR OF RING FINGER;  Surgeon: Mack Hook, MD;  Location: [facility] OR;  Service: Orthopedics;  Laterality: Left;   BACK SURGERY     CARDIAC CATHETERIZATION N/A 12/22/2015   Procedure: Left Heart Cath and Coronary Angiography;  Surgeon: Peter M Swaziland, MD;  Location: [facility] INVASIVE CV LAB;  Service: Cardiovascular;  Laterality: N/A;   CARDIAC ELECTROPHYSIOLOGY STUDY AND ABLATION     ELECTROPHYSIOLOGIC STUDY N/A 02/19/2016   Procedure: SVT Ablation;  Surgeon: Marinus Maw, MD;  Location: [facility] INVASIVE CV LAB;  Service: Cardiovascular;  Laterality: N/A;   septal reconstruction  2017   TURBINATE REDUCTION Bilateral 07/2016   WISDOM TOOTH EXTRACTION       Current Outpatient Medications on File Prior to Visit  Medication Sig Dispense Refill   acetaminophen (TYLENOL) 325 MG tablet Take 2 tablets (650 mg total) by mouth every 6 (six) hours.     albuterol (PROAIR HFA) 108 (90 Base) MCG/ACT inhaler Inhale 1-2 puffs into the lungs every 6 (six) hours as needed for wheezing or shortness of breath. 24 g 3   azelastine (ASTELIN) 0.1 % nasal spray 1-2 sprays per nostril 2 times daily as needed. 90 mL 3   Budeson-Glycopyrrol-Formoterol (BREZTRI AEROSPHERE) 160-9-4.8 MCG/ACT AERO Inhale 2 puffs into the lungs in the  morning and at bedtime. 10.7 g 11   ezetimibe (ZETIA) 10 MG tablet Take 1 tablet (10 mg total) by mouth daily. 90 tablet 3   famotidine-calcium carbonate-magnesium hydroxide (PEPCID COMPLETE) 10-800-165 MG chewable tablet Chew by mouth.     fenofibrate (TRICOR) 145 MG tablet Take 1 tablet (145 mg total) by mouth daily. 90 tablet 3   gabapentin (NEURONTIN) 100 MG capsule TAKE 1 CAPSULE(100 MG) BY MOUTH AT BEDTIME 90 capsule 0   ibuprofen (ADVIL) 600 MG tablet Take 600 mg by mouth in the morning and at bedtime.     losartan (COZAAR) 25 MG tablet Take 1 tablet (25 mg total) by mouth daily. 90 tablet 3   Magnesium 250 MG TABS Take 1 tablet by mouth once a week.     metFORMIN (GLUCOPHAGE) 500 MG tablet TAKE 1 TABLET(500 MG) BY MOUTH TWICE DAILY WITH A MEAL 180 tablet 3   metoprolol tartrate (LOPRESSOR) 25 MG tablet Take one tablet by mouth twice a day.  May take one additional tablet by mouth as needed for breakthrough palpitations. 270 tablet 3   pantoprazole (PROTONIX) 40 MG tablet Take 1 tablet (40 mg total) by mouth daily. 30 tablet 11   PRESCRIPTION MEDICATION Inhale into the lungs at bedtime. CPAP     Semaglutide-Weight Management (WEGOVY) 0.25 MG/0.5ML SOAJ 0.25 mg weekly injection 2 mL 0   sildenafil (REVATIO) 20 MG tablet 2-5 tab one hour prior to sex. 40 tablet 0   Spacer/Aero-Holding Chambers (AEROCHAMBER MV) inhaler Use as instructed 1 each 0   Vitamin D, Ergocalciferol, (DRISDOL) 1.25 MG (50000 UNIT) CAPS capsule Take 1 capsule (50,000 Units total) by mouth every 7 (seven) days. 8 capsule 3   XARELTO 20 MG TABS tablet TAKE 1 TABLET(20 MG) BY MOUTH DAILY WITH SUPPER 90 tablet 0   benzonatate (TESSALON) 100 MG capsule Take 1 capsule (100 mg total) by mouth 3 (three) times daily as needed for cough. 30  capsule 0   flecainide (TAMBOCOR) 150 MG tablet TAKE 1 TABLET(150 MG) BY MOUTH TWICE DAILY 180 tablet 3   HYDROcodone-acetaminophen (NORCO) 5-325 MG tablet Take 1 tablet by mouth every 6 (six)  hours as needed for moderate pain. 20 tablet 0   levocetirizine (XYZAL) 5 MG tablet TAKE 1 TABLET(5 MG) BY MOUTH EVERY EVENING 30 tablet 3   montelukast (SINGULAIR) 10 MG tablet TAKE 1 TABLET BY MOUTH 1 TIME AT NIGHT FOR COUGHING OR WHEEZING 90 tablet 3   predniSONE (DELTASONE) 20 MG tablet Take 1 tablet (20 mg total) by mouth daily with breakfast. 7 tablet 0   No current facility-administered medications on file prior to visit.    Allergies  Allergen Reactions   Dust Mite Extract    Pork-Derived Products Swelling   Statins     Muscle aches   Tree Extract Other (See Comments)    Intolerance      DIAGNOSTIC DATA (LABS, IMAGING, TESTING) - I reviewed patient records, labs, notes, testing and imaging myself where available.  Lab Results  Component Value Date   WBC 10.7 (H) 09/22/2023   HGB 16.4 09/22/2023   HCT 49.7 09/22/2023   MCV 84.2 09/22/2023   PLT 202 09/22/2023      Component Value Date/Time   NA 136 09/22/2023 1405   K 4.5 09/22/2023 1405   CL 103 09/22/2023 1405   CO2 25 09/22/2023 1405   GLUCOSE 138 (H) 09/22/2023 1405   BUN 25 (H) 09/22/2023 1405   CREATININE 1.16 09/22/2023 1405   CREATININE 1.18 02/12/2016 1508   CALCIUM 8.8 (L) 09/22/2023 1405   PROT 7.4 09/22/2023 1405   ALBUMIN 3.9 09/22/2023 1405   AST 18 09/22/2023 1405   ALT 21 09/22/2023 1405   ALKPHOS 70 09/22/2023 1405   BILITOT 0.9 09/22/2023 1405   GFRNONAA >60 09/22/2023 1405   GFRNONAA >89 03/04/2014 1456   GFRAA >60 07/15/2018 1324   GFRAA >89 03/04/2014 1456   Lab Results  Component Value Date   CHOL 248 (H) 09/12/2023   HDL 41.50 09/12/2023   LDLCALC 168 (H) 09/12/2023   LDLDIRECT 150.0 03/10/2023   TRIG 195.0 (H) 09/12/2023   CHOLHDL 6 09/12/2023   Lab Results  Component Value Date   HGBA1C 6.1 09/12/2023   Lab Results  Component Value Date   VITAMINB12 606 03/10/2023   Lab Results  Component Value Date   TSH 2.04 03/10/2023    PHYSICAL EXAM:  Today's Vitals    10/27/23 1436  BP: 125/73  Pulse: (!) 55  Weight: 266 lb (120.7 kg)  Height: 5\' 9"  (1.753 m)   Body mass index is 39.28 kg/m.   Wt Readings from Last 3 Encounters:  10/27/23 266 lb (120.7 kg)  10/11/23 260 lb 14.4 oz (118.3 kg)  10/06/23 223 lb 3.2 oz (101.2 kg)     Ht Readings from Last 3 Encounters:  10/27/23 5\' 9"  (1.753 m)  10/11/23 5' 8.5" (1.74 m)  10/06/23 5' 8.5" (1.74 m)      General: The patient is awake, alert and appears not in acute distress. The patient has gained weight  Head: Normocephalic, atraumatic.  Neck is supple. Mallampati 3 plus ,  neck circumference:19.25  inches .  Nasal airflow  patent.   Overbite Harlow Asa is not  seen.  Dental status: irregular  Cardiovascular:  Regular rate and cardiac rhythm by pulse,  without distended neck veins. Respiratory: Lungs are clear to auscultation.  Skin:  Without evidence of ankle  edema, or rash. Trunk: The patient's posture is erect.   NEUROLOGIC EXAM: Alert oriented to time, place, history taking. Follows all commands speech and language fluent Cranial nerve :  Lost smell and taste for abut 7 days. Extraocular movements were full, visual field were full on confrontational test. Facial sensation and strength were normal. Uvula and tongue in midline. Neck ROM and shoulder shrug  were normal and symmetric. Motor: The motor testing reveals 5 /5 strength of all 4 extremities.  Symmetric motor tone is noted throughout. Neck circumference 18.5 inches, Mallampati 3+ .  Sensory: Sensory testing is intact to soft touch on all 4 extremities.Coordination:  good finger-nose- bilaterally.  Gait and station: Gait is affected by bilateral knee pain.  Babinski response was deferred  .    ASSESSMENT AND PLAN 68 y.o. year old male  here with:  Well controlled apnea on CPAP, 100% compliance and residual AHI of 0.1/h and  95% pressure at 8.1 cm water. I cannot deduce that his CPAP  is not working well, no reason to doubt the  residual AHI.     1)  increasing fatigue :  feeling exhausted, having SOB, and less exercise tolerance.   2) he has not received a replacement CPAP , and was willing to send his old one back:  his machine is working , I will again order a ONO on CPAP.  3)  I would like for him to see cardiology again and enroll in weight and wellness.  He is interested to enroll in a medical weight loss program.   I plan to follow up either personally or through our NP within 6 months.   I would like to thank Esperanza Richters, Pa-c [address] [place],  [place] [address] for allowing me to meet with and to take care of this pleasant patient.    After spending a total time of  35  minutes face to face and additional time for physical and neurologic examination, review of laboratory studies,  personal review of imaging studies, reports and results of other testing and review of referral information / records as far as provided in visit,   Electronically signed by: Melvyn Novas, MD 10/27/2023 3:23 PM  [facility] Board certified by The [employer] of Sleep Medicine and Diplomate of the [employer] of Sleep Medicine. Board certified In Neurology through the ABPN, Fellow of the [employer] of Neurology.

## 2023-11-01 ENCOUNTER — Encounter (HOSPITAL_BASED_OUTPATIENT_CLINIC_OR_DEPARTMENT_OTHER): Payer: Self-pay | Admitting: Pulmonary Disease

## 2023-11-02 ENCOUNTER — Telehealth: Payer: Self-pay | Admitting: Neurology

## 2023-11-02 NOTE — Telephone Encounter (Signed)
 CPAP [employer] medicare pending & Medicaid no auth req

## 2023-11-03 ENCOUNTER — Ambulatory Visit (INDEPENDENT_AMBULATORY_CARE_PROVIDER_SITE_OTHER): Payer: Medicare (Managed Care) | Admitting: Internal Medicine

## 2023-11-03 ENCOUNTER — Encounter (INDEPENDENT_AMBULATORY_CARE_PROVIDER_SITE_OTHER): Payer: Self-pay | Admitting: Internal Medicine

## 2023-11-03 VITALS — BP 111/62 | HR 77 | Temp 97.9°F | Ht 67.0 in | Wt 264.0 lb

## 2023-11-03 DIAGNOSIS — Z6841 Body Mass Index (BMI) 40.0 and over, adult: Secondary | ICD-10-CM

## 2023-11-03 DIAGNOSIS — R739 Hyperglycemia, unspecified: Secondary | ICD-10-CM

## 2023-11-03 DIAGNOSIS — I10 Essential (primary) hypertension: Secondary | ICD-10-CM

## 2023-11-03 DIAGNOSIS — G4733 Obstructive sleep apnea (adult) (pediatric): Secondary | ICD-10-CM

## 2023-11-03 DIAGNOSIS — R0602 Shortness of breath: Secondary | ICD-10-CM | POA: Diagnosis not present

## 2023-11-03 DIAGNOSIS — R5383 Other fatigue: Secondary | ICD-10-CM

## 2023-11-03 DIAGNOSIS — E66812 Obesity, class 2: Secondary | ICD-10-CM | POA: Insufficient documentation

## 2023-11-03 DIAGNOSIS — M170 Bilateral primary osteoarthritis of knee: Secondary | ICD-10-CM

## 2023-11-03 DIAGNOSIS — R7303 Prediabetes: Secondary | ICD-10-CM

## 2023-11-03 DIAGNOSIS — Z1331 Encounter for screening for depression: Secondary | ICD-10-CM

## 2023-11-03 DIAGNOSIS — E66813 Obesity, class 3: Secondary | ICD-10-CM

## 2023-11-03 NOTE — Assessment & Plan Note (Signed)
 We had some difficulties reviewing the principles of weight management as there was a lot of interest and discussion revolving around antiobesity medications.  He is highly interested in GLP-1 therapy so I had to explain indications, insurance requirements as well as cost of treatment.  Patient was educated on what the medications do and do not and that they are meant to be used long-term to be successful.  He has obtained Ozempic from his country and has about 3 months worth I explained to him that this will not work using sporadically.  He also had been on unknown injections and pills many years ago and did respond to treatment but experienced weight regain after discontinuation.  We reviewed the principles of a comprehensive weight management plan including emphasis on nutrition, behavioral and physical activity we also discussed the role of antiobesity medications as an adjunct.  I did not get to go over his meal plan in detail and I provided him with 2 options 1 was based on a standard American diet the other 1 on calorie target meal plan influenced by [place]

## 2023-11-03 NOTE — Assessment & Plan Note (Signed)
 Based on most recent hemoglobin A1c of 6.0. Based on recent labs he had a blood sugar of 138 not sure if this was fasting, his most recent A1c has been 6.0.  He is under the impression that he has " type 2 diabetes" but he does not have this diagnosis in his chart.  He will reach out to his PCP to clarify this..  We are checking fasting blood glucose and insulin levels today.

## 2023-11-03 NOTE — Assessment & Plan Note (Addendum)
 Affecting quality of life and mobility.  Worsened by biomechanical forces associated with this weight.  Losing 10 to 15% of body weight may improve bilateral knee pain.

## 2023-11-03 NOTE — Assessment & Plan Note (Signed)
 On CPAP with reported good compliance. Continue PAP therapy. Losing 15% or more of body weight may improve AHI.

## 2023-11-03 NOTE — Assessment & Plan Note (Signed)
 Blood pressure at goal for age and risk category.  On losartan and metoprolol without adverse effects.  Metoprolol may cause weight gain most recent renal parameters reviewed which showed normal electrolytes and kidney function.  Continue with weight loss therapy. Losing 10% may improve blood pressure control. Monitor for symptoms of orthostasis while losing weight. Continue current regimen and home monitoring for a goal blood pressure of 120/80.  Losartan and metoprolol

## 2023-11-03 NOTE — Progress Notes (Signed)
 [address] [place],  [place], [place] [address]  Office: [phone]  /  Fax: [phone]   Subjective   Initial Visit  Richard Walker (MR# [id number]) is an 68 y.o. male who presents for evaluation and treatment of obesity and related comorbidities. Current BMI is Body mass index is 41.35 kg/m. Dainel has been struggling with his weight for many years and has been unsuccessful in either losing weight, maintaining weight loss, or reaching his healthy weight goal.  He is highly interested in medical therapy.  Had been on some unknown injections and pills in the past provided at a weight loss clinic where he felt he had good results this was more than 10 years ago.  He is inquiring about GLP-1 therapy.  Has been trying to get on medication but does not have insurance coverage.  Yang is currently in the action stage of change and ready to dedicate time achieving and maintaining a healthier weight. Braison is interested in becoming our patient and working on intensive lifestyle modifications including (but not limited to) diet and exercise for weight loss.  When asked how their weight has affected their life and health, he states: Contributed to medical problems, Contributed to orthopedic problems or mobility issues, Having fatigue, and Having poor endurance  When asked what else they would like to accomplish? He states: Adopt healthier eating patterns, Improve energy levels and physical activity, Improve existing medical conditions, Reduce number of medications, and Improve quality of life  Weight history:  He starting to note weight gain during :  after retirement and knee problems .  Life events associated with weight gain include : job change.   Other contributing factors: Family history of obesity, Disruption of circadian rhythm / sleep disordered breathing, and Use of obesogenic medications: Beta-blockers and Steroids.  Their highest weight has been:  275 lbs.  Desired weight: 195  Previous  weight-loss programs :  Dr. Talitha Givens weight management "program using injections and pills" .  Their maximum weight loss was:  50 lbs.  Their greatest challenge with dieting: difficulty maintaining reduced calorie state.  Current or previous pharmacotherapy: Other: unknown injection, really interested in GLP-1 .  Response to medication: Lost weight initially but was unable to sustain weight loss  Nutritional History:  Current nutrition plan: None.  How many times do you eat outside the home: 1-2 per week  How often do they skip meals: skips breakfast  What beverages do they drink: water and unsweetened tea 1-2 per week.   Use of artificial sweetners : No  Food intolerances or dislikes: none.  Food triggers: Boredom and To help comfort self.  Food cravings:  [place] food  Do they struggle with excessive hunger or portion control : Yes   Current level of physical activity: Limited due to chronic pain or orthopedic problems  Past medical history includes:   Past Medical History:  Diagnosis Date   DDD (degenerative disc disease), lumbar    L3/L4   Diverticulosis    GERD (gastroesophageal reflux disease)    Hyperlipidemia    Low sperm motility    Pre-diabetes    Sleep apnea    uses cpap    SVT (supraventricular tachycardia) (HCC)      Objective   BP 111/62   Pulse 77   Temp 97.9 F (36.6 C)   Ht 5\' 7"  (1.702 m)   Wt 264 lb (119.7 kg)   SpO2 100%   BMI 41.35 kg/m  He was weighed on the  bioimpedance scale: Body mass index is 41.35 kg/m.    Anthropometrics:  Vitals Temp: 97.9 F (36.6 C) BP: 111/62 Pulse Rate: 77 SpO2: 100 %   Anthropometric Measurements Height: 5\' 7"  (1.702 m) Weight: 264 lb (119.7 kg) BMI (Calculated): 41.34 Starting Weight: 264 lb Peak Weight: 270 lb Waist Measurement : 52 inches   Body Composition  Body Fat %: 37.5 % Fat Mass (lbs): 99.2 lbs Muscle Mass (lbs): 157 lbs Total Body Water (lbs): 117.6 lbs Visceral Fat  Rating : 26   Other Clinical Data RMR: 2405 Fasting: yes Labs: yes Today's Visit #: 1 Starting Date: 11/03/23    Physical Exam:  General: He is overweight, cooperative, alert, well developed, and in no acute distress. PSYCH: Has normal mood, affect and thought process.   HEENT: EOMI, sclerae are anicteric. Lungs: Normal breathing effort, no conversational dyspnea. Extremities: No edema.  Neurologic: No gross sensory or motor deficits. No tremors or fasciculations noted.    Diagnostic Data Reviewed  EKG: September 21, 2018 25th showed normal sinus rhythm with a heart rate of 69 abnormal R wave progression  Indirect Calorimeter completed today shows a VO2 of 349 and a REE of 2405.  His calculated basal metabolic rate is 1478 thus his resting energy expenditure faster than calculated.  Depression Screen  Savino's PHQ-9 score was: 26.     12/29/2022    3:07 PM  Depression screen PHQ 2/9  Decreased Interest 0  Down, Depressed, Hopeless 0  PHQ - 2 Score 0    Screening for Sleep Related Breathing Disorders  Epworth Sleepiness Score is 20.   BMET    Component Value Date/Time   NA 136 09/22/2023 1405   K 4.5 09/22/2023 1405   CL 103 09/22/2023 1405   CO2 25 09/22/2023 1405   GLUCOSE 138 (H) 09/22/2023 1405   BUN 25 (H) 09/22/2023 1405   CREATININE 1.16 09/22/2023 1405   CREATININE 1.18 02/12/2016 1508   CALCIUM 8.8 (L) 09/22/2023 1405   GFRNONAA >60 09/22/2023 1405   GFRNONAA >89 03/04/2014 1456   GFRAA >60 07/15/2018 1324   GFRAA >89 03/04/2014 1456   Lab Results  Component Value Date   HGBA1C 6.1 09/12/2023   HGBA1C 5.7 12/24/2015   No results found for: "INSULIN" CBC    Component Value Date/Time   WBC 10.7 (H) 09/22/2023 1405   RBC 5.90 (H) 09/22/2023 1405   HGB 16.4 09/22/2023 1405   HCT 49.7 09/22/2023 1405   PLT 202 09/22/2023 1405   MCV 84.2 09/22/2023 1405   MCH 27.8 09/22/2023 1405   MCHC 33.0 09/22/2023 1405   RDW 14.2 09/22/2023 1405    Iron/TIBC/Ferritin/ %Sat    Component Value Date/Time   IRON 69 06/28/2018 1611   Lipid Panel     Component Value Date/Time   CHOL 248 (H) 09/12/2023 1325   TRIG 195.0 (H) 09/12/2023 1325   HDL 41.50 09/12/2023 1325   CHOLHDL 6 09/12/2023 1325   VLDL 39.0 09/12/2023 1325   LDLCALC 168 (H) 09/12/2023 1325   LDLDIRECT 150.0 03/10/2023 0951   Hepatic Function Panel     Component Value Date/Time   PROT 7.4 09/22/2023 1405   ALBUMIN 3.9 09/22/2023 1405   AST 18 09/22/2023 1405   ALT 21 09/22/2023 1405   ALKPHOS 70 09/22/2023 1405   BILITOT 0.9 09/22/2023 1405   BILIDIR 0.1 01/06/2017 1600      Component Value Date/Time   TSH 2.04 03/10/2023 0951     Assessment and  Plan   TREATMENT PLAN FOR OBESITY:  Recommended Dietary Goals  Miciah is currently in the action stage of change. As such, his goal is to implement medically supervised weight loss plan.  He has agreed to implement: the Category 3 plan - 1500 kcal per day, he was provided with a category 3 meal plan and I also generated a 1500-calorie high-protein 7-day plan with [place] food because of patient's cultural background.  Behavioral Intervention  We discussed the following Behavioral Modification Strategies today: increasing lean protein intake to established goals, decreasing simple carbohydrates , increasing vegetables, increasing lower glycemic fruits, increasing fiber rich foods, avoiding skipping meals, increasing water intake, work on meal planning and preparation, reading food labels , keeping healthy foods at home, identifying sources and decreasing liquid calories, decreasing eating out or consumption of processed foods, and making healthy choices when eating convenient foods, planning for success, and better snacking choices  Additional resources provided today: Handout on healthy eating and balanced plate, Handout on complex carbohydrates and lean sources of protein, and Category 3  packet  Recommended Physical Activity Goals  Kc has been advised to work up to 150 minutes of moderate intensity aerobic activity a week and strengthening exercises 2-3 times per week for cardiovascular health, weight loss maintenance and preservation of muscle mass.   He has agreed to :  Think about enjoyable ways to increase daily physical activity and overcoming barriers to exercise and Increase physical activity in their day and reduce sedentary time (increase NEAT).  Pharmacotherapy We will work on building a Therapist, art and behavioral strategies. We will discuss the role of pharmacotherapy as an adjunct at subsequent visits.   ASSOCIATED CONDITIONS ADDRESSED TODAY  Other Fatigue Donel does feel that his weight is causing his energy to be lower than it should be. Fatigue may be related to obesity, depression or many other causes. Labs will be ordered, and in the meanwhile, Dempsy will focus on self care including making healthy food choices, increasing physical activity and focusing on stress reduction.  Shortness of Breath Emerald notes increasing shortness of breath with exercising and seems to be worsening over time with weight gain. He notes getting out of breath sooner with activity than he used to. This has not gotten worse recently. Quinnlan denies shortness of breath at rest or orthopnea.Velma notes increasing shortness of breath with exercising and seems to be worsening over time with weight gain. He notes getting out of breath sooner with activity than he used to. This has not gotten worse recently. Liban denies shortness of breath at rest or orthopnea.  Primary hypertension Assessment & Plan: Blood pressure at goal for age and risk category.  On losartan and metoprolol without adverse effects.  Metoprolol may cause weight gain most recent renal parameters reviewed which showed normal electrolytes and kidney function.  Continue with weight loss therapy. Losing 10% may  improve blood pressure control. Monitor for symptoms of orthostasis while losing weight. Continue current regimen and home monitoring for a goal blood pressure of 120/80.  Losartan and metoprolol   OSA on CPAP Assessment & Plan: On CPAP with reported good compliance. Continue PAP therapy. Losing 15% or more of body weight may improve AHI.      Class 3 severe obesity with serious comorbidity and body mass index (BMI) of 40.0 to 44.9 in adult, unspecified obesity type [facility]) Assessment & Plan: We had some difficulties reviewing the principles of weight management as there was a lot of interest  and discussion revolving around antiobesity medications.  He is highly interested in GLP-1 therapy so I had to explain indications, insurance requirements as well as cost of treatment.  Patient was educated on what the medications do and do not and that they are meant to be used long-term to be successful.  He has obtained Ozempic from [place] country and has about 3 months worth I explained to him that this will not work using sporadically.  He also had been on unknown injections and pills many years ago and did respond to treatment but experienced weight regain after discontinuation.  We reviewed the principles of a comprehensive weight management plan including emphasis on nutrition, behavioral and physical activity we also discussed the role of antiobesity medications as an adjunct.  I did not get to go over his meal plan in detail and I provided him with 2 options 1 was based on a standard [place] diet the other 1 on calorie target meal plan influenced by [place] food   Primary osteoarthritis of both knees Assessment & Plan: Affecting quality of life and mobility.  Worsened by biomechanical forces associated with this weight.  Losing 10 to 15% of body weight may improve bilateral knee pain.   Elevated blood sugar Assessment & Plan: Based on recent labs he had a blood sugar of 138 not sure if  this was fasting, his open A1c has been 6.0.  He is under the impression that he has " type 2 diabetes" but he does not have this diagnosis in his chart.  He will reach out to his PCP to clarify this..  Were checking fasting blood glucose and insulin levels today.  Orders: -     CMP14+EGFR -     Insulin, random  Prediabetes Assessment & Plan: Based on most recent hemoglobin A1c of 6.0. Based on recent labs he had a blood sugar of 138 not sure if this was fasting, his most recent A1c has been 6.0.  He is under the impression that he has " type 2 diabetes" but he does not have this diagnosis in his chart.  He will reach out to his PCP to clarify this..  We are checking fasting blood glucose and insulin levels today.     Follow-up  He was informed of the importance of frequent follow-up visits to maximize his success with intensive lifestyle modifications for his multiple health conditions. He was informed we would discuss his lab results at his next visit unless there is a critical issue that needs to be addressed sooner. Besnik agreed to keep his next visit at the agreed upon time to discuss these results.  Attestation Statement  This is the patient's intake visit at [facility]. The patient's Health Questionnaire was reviewed at length. Included in the packet: current and past health history, medications, allergies, ROS, gynecologic history (women only), surgical history, family history, social history, weight history, weight loss surgery history (for those that have had weight loss surgery), nutritional evaluation, mood and food questionnaire, PHQ9, Epworth questionnaire, sleep habits questionnaire, patient life and health improvement goals questionnaire. These will all be scanned into the patient's chart under media.   During the visit, I independently reviewed the patient's EKG, previous labs, bioimpedance scale results, and indirect calorimetry results. I used this information  to medically tailor a meal plan for the patient that will help him to lose weight and will improve his obesity-related conditions. I performed a medically necessary appropriate examination and/or evaluation. I discussed  the assessment and treatment plan with the patient. The patient was provided an opportunity to ask questions and all were answered. The patient agreed with the plan and demonstrated an understanding of the instructions. Labs were ordered at this visit and will be reviewed at the next visit unless critical results need to be addressed immediately. Clinical information was updated and documented in the EMR.   In addition, they received basic education on identification of processed foods and reduction of these, different sources of lean proteins and complex carbohydrates and how to eat balanced by incorporation of whole foods.  Reviewed by clinician on day of visit: allergies, medications, problem list, medical history, surgical history, family history, social history, and previous encounter notes.  I have spent 60 minutes in the care of the patient today including: preparing to see patient (e.g. review and interpretation of tests, old notes ), obtaining and/or reviewing separately obtained history, performing a medically appropriate examination or evaluation, counseling and educating the patient, ordering medications, test or procedures, documenting clinical information in the electronic or other health care record, and independently interpreting results and communicating results to the patient, family, or caregiver      Worthy Rancher, MD

## 2023-11-03 NOTE — Assessment & Plan Note (Signed)
 Based on recent labs he had a blood sugar of 138 not sure if this was fasting, his open A1c has been 6.0.  He is under the impression that he has " type 2 diabetes" but he does not have this diagnosis in his chart.  He will reach out to his PCP to clarify this..  Were checking fasting blood glucose and insulin levels today.

## 2023-11-04 LAB — CMP14+EGFR
ALT: 28 IU/L (ref 0–44)
AST: 30 IU/L (ref 0–40)
Albumin: 4.3 g/dL (ref 3.9–4.9)
Alkaline Phosphatase: 79 IU/L (ref 44–121)
BUN/Creatinine Ratio: 15 (ref 10–24)
BUN: 19 mg/dL (ref 8–27)
Bilirubin Total: 0.3 mg/dL (ref 0.0–1.2)
CO2: 21 mmol/L (ref 20–29)
Calcium: 10 mg/dL (ref 8.6–10.2)
Chloride: 100 mmol/L (ref 96–106)
Creatinine, Ser: 1.25 mg/dL (ref 0.76–1.27)
Globulin, Total: 2.6 g/dL (ref 1.5–4.5)
Glucose: 97 mg/dL (ref 70–99)
Potassium: 5.3 mmol/L — ABNORMAL HIGH (ref 3.5–5.2)
Sodium: 138 mmol/L (ref 134–144)
Total Protein: 6.9 g/dL (ref 6.0–8.5)
eGFR: 63 mL/min/{1.73_m2} (ref >59)

## 2023-11-04 LAB — INSULIN, RANDOM: INSULIN: 25.3 u[IU]/mL — ABNORMAL HIGH (ref 2.6–24.9)

## 2023-11-10 ENCOUNTER — Ambulatory Visit (INDEPENDENT_AMBULATORY_CARE_PROVIDER_SITE_OTHER): Payer: Medicare (Managed Care) | Admitting: Medical

## 2023-11-10 VITALS — BP 124/76 | HR 56 | Resp 18 | Ht 67.0 in | Wt 263.0 lb

## 2023-11-10 DIAGNOSIS — K219 Gastro-esophageal reflux disease without esophagitis: Secondary | ICD-10-CM

## 2023-11-10 DIAGNOSIS — E785 Hyperlipidemia, unspecified: Secondary | ICD-10-CM | POA: Diagnosis not present

## 2023-11-10 DIAGNOSIS — R0789 Other chest pain: Secondary | ICD-10-CM | POA: Diagnosis not present

## 2023-11-10 DIAGNOSIS — R682 Dry mouth, unspecified: Secondary | ICD-10-CM | POA: Diagnosis not present

## 2023-11-10 DIAGNOSIS — R7303 Prediabetes: Secondary | ICD-10-CM

## 2023-11-10 DIAGNOSIS — R0609 Other forms of dyspnea: Secondary | ICD-10-CM

## 2023-11-10 MED ORDER — SEMAGLUTIDE-WEIGHT MANAGEMENT 0.25 MG/0.5ML ~~LOC~~ SOAJ: 0.2500 mg | SUBCUTANEOUS | 0 refills | Status: AC

## 2023-11-10 MED ORDER — SILDENAFIL CITRATE 100 MG PO TABS: 100.0000 mg | ORAL_TABLET | Freq: Every day | ORAL | 3 refills | Status: DC | PRN

## 2023-11-10 NOTE — Progress Notes (Signed)
 Subjective:    Patient ID: Richard Walker, male    DOB: 1955/11/12, 68 y.o.   MRN: [id number]  HPI  Fue Richard Walker is a 68 year old male with atrial fibrillation and prediabetes who presents with chest pain and dry mouth.  He experienced chest pain approximately one week ago, starting near the clavicle and moving to the chest, lasting 15 to 20 minutes while driving. He was fasting and did not take any medication at that time. He occasionally before most recent event one week ago  has tight and sharp chest pains lasting 30 seconds to a minute, but the recent episode was more prolonged and concerning. No current chest pain is reported. Since last event one week ago has not had any recurrent chest pain nor any associated cardiac like symptoms such as jaw pain, shoulder pain, arm pain or sweating.  He has a history of atrial fibrillation and takes medication for it, managing doses around his fasting schedule, with doses in the evening and morning after breaking his fast. He denies skipping any doses.  He has prediabetes and is on metformin to aid in weight loss. He is concerned about increasing blood sugar levels, noting a previous diagnosis of type 2 diabetes by another doctor, though current records indicate only prediabetes.  He uses a CPAP machine for sleep apnea, reporting improved sleep quality but experiencing dry mouth and increased mucus production, which he finds bothersome. He keeps water by his bed to manage dry mouth during the night.  He experiences shortness of breath with minimal activity, such as walking to his car, but denies any swelling in his legs.  He request refill of viagra for ED. No use of nitroglycerin on review.   Review of Systems  See hpi.      Objective:   Physical Exam  General Mental Status- Alert. General Appearance- Not in acute distress.   Skin General: Color- Normal Color. Moisture- Normal Moisture.  Neck Carotid Arteries- Normal color. Moisture-  Normal Moisture. No carotid bruits. No JVD.  Chest and Lung Exam Auscultation: Breath Sounds:-Normal.  Cardiovascular Auscultation:Rythm- Regular. Murmurs & Other Heart Sounds:Auscultation of the heart reveals- No Murmurs.  Abdomen Inspection:-Inspeection Normal. Palpation/Percussion:Note:No mass. Palpation and Percussion of the abdomen reveal- Non Tender, Non Distended + BS, no rebound or guarding.   Neurologic Cranial Nerve exam:- CN III-XII intact(No nystagmus), symmetric smile. Strength:- 5/5 equal and symmetric strength both upper and lower extremities.   Lower ext- calfs symmetric. No pedal edema. Negative homans signs.    Assessment & Plan:   Patient Instructions  Intermittent Chest Pain Differential includes angina or musculoskeletal pain. Atrial fibrillation noted, but EKG shows normal sinus rhythm without ischemic changes(some bradycardia). Advised emergency care for recurrent pain. - Perform EKG for comparison.  Sinus rythm. Bradycardia. no ischemic changes. - Refer to cardiologist for expedited appointment.  Dyspnea on exertion.  Order chest X-ray. - Order BNP test. - Order metabolic panel.   Knee Pain Severe bilateral knee pain, described as bone-on-bone. Weight loss necessary before surgical intervention. Informed weight loss could improve condition and health. - Encourage weight loss for pain alleviation and surgical preparation.  Obesity Obesity impacts knee pain management and potential surgery. Participating in weight loss program. - Continue weight loss management program.  Sleep Apnea Uses CPAP with dry mouth and mucus issues. Neurologist recommended repeat sleep study. Adjusts humidity settings but experiences water accumulation. Advised to drink water at night. - Repeat sleep study as recommended. -Get neurologist opininon  who rx'd cpap.  Prediabetes A1c 6.1 indicates prediabetes. On metformin for weight loss and blood sugar management. Confusion  about type 2 diabetes diagnosis; advised to obtain previous records. - Continue metformin. - Request previous medical records for diagnosis verification.  Erectile Dysfunction Prescribed sildenafil. Prefers higher dose for cost-effective tablet splitting. Informed about cost benefits. - Prescribe sildenafil 100 mg tablets with splitting instructions. No nitroglycerin use.  Follow-up Follow-up contingent on test results and cardiology referral. Advised to follow up with cardiologist due to recent chest pain. - Follow up with cardiologist. - Determine follow-up date based on lab and imaging review.    Esperanza Richters, PA-C   Time spent with patient today was 50  minutes which consisted of chart revdiew, discussing diagnosis, work up treatment and documentation.

## 2023-11-10 NOTE — Patient Instructions (Signed)
 Intermittent Chest Pain Differential includes angina or musculoskeletal pain. Atrial fibrillation noted, but EKG shows normal sinus rhythm without ischemic changes(some bradycardia). Advised emergency care for recurrent pain. - Perform EKG for comparison.  Sinus rythm. Bradycardia. no ischemic changes. - Refer to cardiologist for expedited appointment.  Dyspnea on exertion.  Order chest X-ray. - Order BNP test. - Order metabolic panel.   Knee Pain Severe bilateral knee pain, described as bone-on-bone. Weight loss necessary before surgical intervention. Informed weight loss could improve condition and health. - Encourage weight loss for pain alleviation and surgical preparation.  Obesity Obesity impacts knee pain management and potential surgery. Participating in weight loss program. - Continue weight loss management program.  Sleep Apnea Uses CPAP with dry mouth and mucus issues. Neurologist recommended repeat sleep study. Adjusts humidity settings but experiences water accumulation. Advised to drink water at night. - Repeat sleep study as recommended. -Get neurologist opininon who rx'd cpap.  Prediabetes A1c 6.1 indicates prediabetes. On metformin for weight loss and blood sugar management. Confusion about type 2 diabetes diagnosis; advised to obtain previous records. - Continue metformin. - Request previous medical records for diagnosis verification.  Erectile Dysfunction Prescribed sildenafil. Prefers higher dose for cost-effective tablet splitting. Informed about cost benefits. - Prescribe sildenafil 100 mg tablets with splitting instructions. No nitroglycerin use.  Follow-up Follow-up contingent on test results and cardiology referral. Advised to follow up with cardiologist due to recent chest pain. - Follow up with cardiologist. - Determine follow-up date based on lab and imaging review.

## 2023-11-11 ENCOUNTER — Ambulatory Visit (HOSPITAL_BASED_OUTPATIENT_CLINIC_OR_DEPARTMENT_OTHER)
Admission: RE | Admit: 2023-11-11 | Discharge: 2023-11-11 | Disposition: A | Discharge status: Home / Self Care | Payer: Medicare (Managed Care) | Source: Ambulatory Visit | Attending: Medical | Admitting: Medical

## 2023-11-11 ENCOUNTER — Encounter: Payer: Self-pay | Admitting: Medical

## 2023-11-11 DIAGNOSIS — R0609 Other forms of dyspnea: Secondary | ICD-10-CM | POA: Insufficient documentation

## 2023-11-11 LAB — COMPREHENSIVE METABOLIC PANEL
ALT: 21 U/L (ref 0–53)
AST: 18 U/L (ref 0–37)
Albumin: 4.6 g/dL (ref 3.5–5.2)
Alkaline Phosphatase: 58 U/L (ref 39–117)
BUN: 21 mg/dL (ref 6–23)
CO2: 29 meq/L (ref 19–32)
Calcium: 10.1 mg/dL (ref 8.4–10.5)
Chloride: 102 meq/L (ref 96–112)
Creatinine, Ser: 1.16 mg/dL (ref 0.40–1.50)
GFR: 65.22 mL/min (ref >60.00)
Glucose, Bld: 89 mg/dL (ref 70–99)
Potassium: 5 meq/L (ref 3.5–5.1)
Sodium: 139 meq/L (ref 135–145)
Total Bilirubin: 0.6 mg/dL (ref 0.2–1.2)
Total Protein: 7.6 g/dL (ref 6.0–8.3)

## 2023-11-11 LAB — BRAIN NATRIURETIC PEPTIDE: Pro B Natriuretic peptide (BNP): 124 pg/mL — ABNORMAL HIGH (ref 0.0–100.0)

## 2023-11-12 ENCOUNTER — Encounter: Payer: Self-pay | Admitting: Medical

## 2023-11-14 NOTE — Telephone Encounter (Signed)
 Still pending

## 2023-11-15 NOTE — Telephone Encounter (Signed)
 CPAP [employer] Berkley Harvey: [id number] (exp. 11/02/23 to 01/31/24) & medicaid no Berkley Harvey req

## 2023-11-15 NOTE — Telephone Encounter (Signed)
 Spoke with the patient.   CPAP [employer] Berkley Harvey: [id number] (exp. 11/02/23 to 01/31/24) & medicaid no auth req   Patient is scheduled at [facility] for 11/29/23 at 8 pm.  Mailed packet & sent mychart.

## 2023-11-17 ENCOUNTER — Ambulatory Visit (INDEPENDENT_AMBULATORY_CARE_PROVIDER_SITE_OTHER): Payer: Medicare (Managed Care) | Admitting: Internal Medicine

## 2023-11-17 ENCOUNTER — Encounter (INDEPENDENT_AMBULATORY_CARE_PROVIDER_SITE_OTHER): Payer: Self-pay | Admitting: Internal Medicine

## 2023-11-17 VITALS — BP 137/86 | HR 57 | Temp 98.0°F | Ht 68.0 in | Wt 256.0 lb

## 2023-11-17 DIAGNOSIS — R7303 Prediabetes: Secondary | ICD-10-CM

## 2023-11-17 DIAGNOSIS — Z6835 Body mass index (BMI) 35.0-35.9, adult: Secondary | ICD-10-CM

## 2023-11-17 DIAGNOSIS — E66812 Obesity, class 2: Secondary | ICD-10-CM

## 2023-11-17 DIAGNOSIS — E88819 Insulin resistance, unspecified: Secondary | ICD-10-CM | POA: Insufficient documentation

## 2023-11-17 DIAGNOSIS — R638 Other symptoms and signs concerning food and fluid intake: Secondary | ICD-10-CM | POA: Diagnosis not present

## 2023-11-17 DIAGNOSIS — G4733 Obstructive sleep apnea (adult) (pediatric): Secondary | ICD-10-CM | POA: Diagnosis not present

## 2023-11-17 MED ORDER — ZONISAMIDE 25 MG PO CAPS: 25.0000 mg | ORAL_CAPSULE | Freq: Every day | ORAL | 0 refills | Status: DC

## 2023-11-17 NOTE — Assessment & Plan Note (Signed)
 He has increased orexigenic signaling, impaired satiety and inhibitory control. This is secondary to an abnormal energy regulation system and pathological neurohormonal pathways characteristic of excess adiposity.  In addition to nutritional and behavioral strategies he benefits from pharmacotherapy.  In addition to nutritional behavioral strategies he will be started on zonisamide 25 mg in the evening.  He is interested in antiobesity medications so we reviewed available options as well as contraindications.  He does not have insurance approved indications for GLP-1.  He also has contraindications to sympathomimetics.  We spent a significant amount of time today reviewing these with patient.

## 2023-11-17 NOTE — Assessment & Plan Note (Signed)
 Based on most recent hemoglobin A1c of 6.0.  He is currently on metformin XR 500 mg twice a day and will continue medication.  He will continue to take B12 supplementation.  Patient was educated on the carb insulin model of obesity today and the importance of maintaining a diet with a low glycemic load.  A lot of time was spent today educating patient on the differences between simple and complex carbs.  He will continue metformin for pharmacoprophylaxis.

## 2023-11-17 NOTE — Assessment & Plan Note (Signed)
 His HOMA-IR is 5.49 which is elevated. Optimal level < 1.9.   This is complex condition associated with genetics, ectopic fat and lifestyle factors. Insulin resistance may result in increased fat storage, inhibition of the breakdown of fat, cause fluctuations in blood sugar leading to energy crashes and increased cravings for sugary or high carb foods and cause metabolic slowdown making it difficult to lose weight.  This may result in additional weight gain and lead to pre-diabetes and diabetes if untreated. In addition, hyperinsulinemia increases cardiovascular risk, chronic inflammatory response and may increase the risk of obesity related malignancies.  Lab Results  Component Value Date   HGBA1C 6.1 09/12/2023   Lab Results  Component Value Date   INSULIN 25.3 (H) 11/03/2023   Lab Results  Component Value Date   GLUCOSE 89 11/10/2023   GLUCOSE 121 (H) 11/02/2011    We reviewed treatment options which include losing 7 to 10% of body weight, increasing volume of physical activity and maintaining a diet low in saturated fats and with a low glycemic load.  Patient has also been educated on the carb insulin model of obesity.  Continue with metformin for pharmacoprophylaxis.

## 2023-11-17 NOTE — Progress Notes (Signed)
 Office: [phone]  /  Fax: [phone]  Weight Summary And Biometrics  Vitals Temp: 98 F (36.7 C) BP: 137/86 Pulse Rate: (!) 57 SpO2: 97 %   Anthropometric Measurements Height: 5\' 8"  (1.727 m) Weight: 256 lb (116.1 kg) BMI (Calculated): 38.93 Weight at Last Visit: 264 lb Weight Lost Since Last Visit: 8 lb Weight Gained Since Last Visit: 0 lb Starting Weight: 246 lb Total Weight Loss (lbs): 8 lb (3.629 kg) Peak Weight: 270 lb   Body Composition  Body Fat %: 35.7 % Fat Mass (lbs): 91.6 lbs Muscle Mass (lbs): 157 lbs Total Body Water (lbs): 117.4 lbs Visceral Fat Rating : 24    RMR: 2405  Today's Visit #: 2  Starting Date: 11/03/23   Subjective   Chief Complaint: Obesity  Interval History Discussed the use of AI scribe software for clinical note transcription with the patient, who gave verbal consent to proceed.  History of Present Illness Richard Walker is a 68 year old male with atrial fibrillation, hypertension, sleep apnea, and prediabetes who presents for medical weight management.  He is here for his second visit for medical weight management and is currently observing Ramadan. He was previously provided with an 1800 calorie meal plan inspired by [place]. He has difficulty tracking calories and is not using any apps for this purpose. He has lost eight pounds since his last visit, although he is uncertain about the accuracy of this weight loss. His body fat percentage has decreased from 37% to 35%, and he has maintained his muscle mass.  He has a history of atrial fibrillation and is advised to limit caffeine. He typically consumes one to two cups of coffee daily, sometimes including Turkish coffee. No recent episodes of atrial fibrillation are reported.  He has prediabetes with an A1c of 6.1 and has been taking metformin for almost two years. He is concerned about the implications of his prediabetes and the potential for developing type 2  diabetes. He understands that his current A1c level does not qualify him for certain medications under his insurance plan.  He reports low levels of vitamin D and vitamin B12, for which he takes an over-the-counter B12 supplement daily. He has a history of high insulin levels, which he understands contributes to his hunger and weight issues. He is working on reducing his carbohydrate consumption.  He has a history of sleep apnea and uses a CPAP machine. He also mentions a past experience with phentermine for appetite control, which he found effective but is aware it is not suitable due to his heart condition.  He is considering knee surgery and is aware that his BMI of 38 does not preclude him from surgery. He experiences difficulty with physical activity due to knee issues, which also affects his ability to pray traditionally.    Challenges affecting patient progress: strong hunger signals and/or impaired satiety / inhibitory control, difficulty implementing reduced calorie nutrition plan, and orthopedic problems, medical conditions or chronic pain affecting mobility.    Pharmacotherapy for weight management: He is currently taking no anti-obesity medication.   Assessment and Plan   Treatment Plan For Obesity:  Recommended Dietary Goals  Richard Walker is currently in the action stage of change. As such, his goal is to continue weight management plan. He has agreed to: portion control, balanced plate and making smarter food choices, such as increasing vegetables, protein intake and reducing simple carbohydrates and processed foods  and patient has been provided with an 1800-calorie meal  plan influenced by [employer] [facility]  We discussed the following behavioral modification strategies today: increasing lean protein intake to established goals, decreasing simple carbohydrates , increasing vegetables, increasing fiber rich foods, and increasing water intake  .  Additional education and resources provided today: Handout on hyperinsulinemia and health risks and Handout on the carbohydrate insulin model of obesity  Recommended Physical Activity Goals  Richard Walker has been advised to work up to 150 minutes of moderate intensity aerobic activity a week and strengthening exercises 2-3 times per week for cardiovascular health, weight loss maintenance and preservation of muscle mass.   He has agreed to :  Think about enjoyable ways to increase daily physical activity and overcoming barriers to exercise and Increase physical activity in their day and reduce sedentary time (increase NEAT).  Pharmacotherapy  We discussed various medication options to help Richard Walker with his weight loss efforts and we both agreed to : start anti-obesity medication.  In addition to reduced calorie nutrition plan (RCNP), behavioral strategies and physical activity, Richard Walker would benefit from pharmacotherapy to assist with hunger signals, satiety and cravings. This will reduce obesity-related health risks by inducing weight loss, and help reduce food consumption and adherence to [name] It may also improve QOL by improving self-confidence and reduce the  setbacks associated with metabolic adaptations.  After discussion of treatment options, mechanisms of action, benefits, side effects, contraindications and shared decision making he is agreeable to starting zonisamide 25 mg once in the evening. Patient also made aware that medication is indicated for long-term management of obesity and the risk of weight regain following discontinuation of treatment and hence the importance of adhering to medical weight loss plan.  We demonstrated use of device and patient using teach back method was able to demonstrate proper technique.  He does not have coverage for GLP-1, this was discussed today. He does not have a diagnosis of type 2 diabetes, this was also reviewed today. He has contraindications to  sympathomimetics that he has a tachyarrhythmia, this was also explained today  Associated Conditions Impacted by Obesity Treatment  Class 2 severe obesity due to excess calories with serious comorbidity and body mass index (BMI) of 35.0 to 35.9 in adult [facility]) Assessment & Plan: He has lost 8 pounds since the last visit, likely aided by observing Ramadan. Concerns about post-Ramadan weight regain were addressed. Emphasis was placed on a balanced diet with a focus on protein to suppress appetite and reduce carbohydrate intake to manage insulin levels. He is not currently tracking calories but was educated on the benefits of doing so. Zonisamide was discussed as an appetite suppressant, with informed consent provided regarding its off-label use for weight management, its safety profile, and potential side effects at higher doses. - Continue 1800 calorie meal plan inspired by [place] food - Educate on balanced diet with focus on protein and reduced carbohydrates - Prescribe zonisamide 25 mg to be taken after 5 PM to help with appetite control - Send prescription to [facility] in [place]  Orders: -     Zonisamide; Take 1 capsule (25 mg total) by mouth daily.  Dispense: 30 capsule; Refill: 0  OSA on CPAP Assessment & Plan: On CPAP with reported good compliance. Continue PAP therapy. Losing 15% or more of body weight may improve AHI.      Abnormal food appetite Assessment & Plan: He has increased orexigenic signaling, impaired satiety and inhibitory control. This is secondary to an abnormal energy regulation  system and pathological neurohormonal pathways characteristic of excess adiposity.  In addition to nutritional and behavioral strategies he benefits from pharmacotherapy.  In addition to nutritional behavioral strategies he will be started on zonisamide 25 mg in the evening.  He is interested in antiobesity medications so we reviewed available options as well as contraindications.  He  does not have insurance approved indications for GLP-1.  He also has contraindications to sympathomimetics.  We spent a significant amount of time today reviewing these with patient.    Prediabetes Assessment & Plan: Based on most recent hemoglobin A1c of 6.0.  He is currently on metformin XR 500 mg twice a day and will continue medication.  He will continue to take B12 supplementation.  Patient was educated on the carb insulin model of obesity today and the importance of maintaining a diet with a low glycemic load.  A lot of time was spent today educating patient on the differences between simple and complex carbs.  He will continue metformin for pharmacoprophylaxis.   Insulin resistance Assessment & Plan: His HOMA-IR is 5.49 which is elevated. Optimal level < 1.9.   This is complex condition associated with genetics, ectopic fat and lifestyle factors. Insulin resistance may result in increased fat storage, inhibition of the breakdown of fat, cause fluctuations in blood sugar leading to energy crashes and increased cravings for sugary or high carb foods and cause metabolic slowdown making it difficult to lose weight.  This may result in additional weight gain and lead to pre-diabetes and diabetes if untreated. In addition, hyperinsulinemia increases cardiovascular risk, chronic inflammatory response and may increase the risk of obesity related malignancies.  Lab Results  Component Value Date   HGBA1C 6.1 09/12/2023   Lab Results  Component Value Date   INSULIN 25.3 (H) 11/03/2023   Lab Results  Component Value Date   GLUCOSE 89 11/10/2023   GLUCOSE 121 (H) 11/02/2011    We reviewed treatment options which include losing 7 to 10% of body weight, increasing volume of physical activity and maintaining a diet low in saturated fats and with a low glycemic load.  Patient has also been educated on the carb insulin model of obesity.  Continue with metformin for pharmacoprophylaxis.             Objective   Physical Exam:  Blood pressure 137/86, pulse (!) 57, temperature 98 F (36.7 C), height 5\' 8"  (1.727 m), weight 256 lb (116.1 kg), SpO2 97%. Body mass index is 38.92 kg/m.  General: He is overweight, cooperative, alert, well developed, and in no acute distress. PSYCH: Has normal mood, affect and thought process.   HEENT: EOMI, sclerae are anicteric. Lungs: Normal breathing effort, no conversational dyspnea. Extremities: No edema.  Neurologic: No gross sensory or motor deficits. No tremors or fasciculations noted.    Diagnostic Data Reviewed:  BMET    Component Value Date/Time   NA 139 11/10/2023 1616   NA 138 11/03/2023 1117   K 5.0 11/10/2023 1616   CL 102 11/10/2023 1616   CO2 29 11/10/2023 1616   GLUCOSE 89 11/10/2023 1616   BUN 21 11/10/2023 1616   BUN 19 11/03/2023 1117   CREATININE 1.16 11/10/2023 1616   CREATININE 1.18 02/12/2016 1508   CALCIUM 10.1 11/10/2023 1616   GFRNONAA >60 09/22/2023 1405   GFRNONAA >89 03/04/2014 1456   GFRAA >60 07/15/2018 1324   GFRAA >89 03/04/2014 1456   Lab Results  Component Value Date   HGBA1C 6.1 09/12/2023   HGBA1C  5.7 12/24/2015   Lab Results  Component Value Date   INSULIN 25.3 (H) 11/03/2023   Lab Results  Component Value Date   TSH 2.04 03/10/2023   CBC    Component Value Date/Time   WBC 10.7 (H) 09/22/2023 1405   RBC 5.90 (H) 09/22/2023 1405   HGB 16.4 09/22/2023 1405   HCT 49.7 09/22/2023 1405   PLT 202 09/22/2023 1405   MCV 84.2 09/22/2023 1405   MCH 27.8 09/22/2023 1405   MCHC 33.0 09/22/2023 1405   RDW 14.2 09/22/2023 1405   Iron Studies    Component Value Date/Time   IRON 69 06/28/2018 1611   Lipid Panel     Component Value Date/Time   CHOL 248 (H) 09/12/2023 1325   TRIG 195.0 (H) 09/12/2023 1325   HDL 41.50 09/12/2023 1325   CHOLHDL 6 09/12/2023 1325   VLDL 39.0 09/12/2023 1325   LDLCALC 168 (H) 09/12/2023 1325   LDLDIRECT 150.0 03/10/2023 0951   Hepatic Function  Panel     Component Value Date/Time   PROT 7.6 11/10/2023 1616   PROT 6.9 11/03/2023 1117   ALBUMIN 4.6 11/10/2023 1616   ALBUMIN 4.3 11/03/2023 1117   AST 18 11/10/2023 1616   ALT 21 11/10/2023 1616   ALKPHOS 58 11/10/2023 1616   BILITOT 0.6 11/10/2023 1616   BILITOT 0.3 11/03/2023 1117   BILIDIR 0.1 01/06/2017 1600      Component Value Date/Time   TSH 2.04 03/10/2023 0951   Nutritional Lab Results  Component Value Date   VD25OH 35.78 09/12/2023   VD25OH 24.68 (L) 03/10/2023   VD25OH 28.99 (L) 08/31/2022    Medications: Outpatient Encounter Medications as of 11/17/2023  Medication Sig   acetaminophen (TYLENOL) 325 MG tablet Take 2 tablets (650 mg total) by mouth every 6 (six) hours.   albuterol (PROAIR HFA) 108 (90 Base) MCG/ACT inhaler Inhale 1-2 puffs into the lungs every 6 (six) hours as needed for wheezing or shortness of breath.   azelastine (ASTELIN) 0.1 % nasal spray 1-2 sprays per nostril 2 times daily as needed.   Budeson-Glycopyrrol-Formoterol (BREZTRI AEROSPHERE) 160-9-4.8 MCG/ACT AERO Inhale 2 puffs into the lungs in the morning and at bedtime.   ezetimibe (ZETIA) 10 MG tablet Take 1 tablet (10 mg total) by mouth daily.   famotidine-calcium carbonate-magnesium hydroxide (PEPCID COMPLETE) 10-800-165 MG chewable tablet Chew by mouth.   fenofibrate (TRICOR) 145 MG tablet Take 1 tablet (145 mg total) by mouth daily.   gabapentin (NEURONTIN) 100 MG capsule TAKE 1 CAPSULE(100 MG) BY MOUTH AT BEDTIME   ibuprofen (ADVIL) 600 MG tablet Take 600 mg by mouth in the morning and at bedtime.   losartan (COZAAR) 25 MG tablet Take 1 tablet (25 mg total) by mouth daily.   Magnesium 250 MG TABS Take 1 tablet by mouth once a week.   metFORMIN (GLUCOPHAGE) 500 MG tablet TAKE 1 TABLET(500 MG) BY MOUTH TWICE DAILY WITH A MEAL   metoprolol tartrate (LOPRESSOR) 25 MG tablet Take one tablet by mouth twice a day.  May take one additional tablet by mouth as needed for breakthrough  palpitations.   pantoprazole (PROTONIX) 40 MG tablet Take 1 tablet (40 mg total) by mouth daily.   PRESCRIPTION MEDICATION Inhale into the lungs at bedtime. CPAP   Semaglutide-Weight Management 0.25 MG/0.5ML SOAJ Inject 0.25 mg into the skin once a week for 28 days.   sildenafil (VIAGRA) 100 MG tablet Take 1 tablet (100 mg total) by mouth daily as needed for erectile dysfunction.  Spacer/Aero-Holding Chambers (AEROCHAMBER MV) inhaler Use as instructed   Vitamin D, Ergocalciferol, (DRISDOL) 1.25 MG (50000 UNIT) CAPS capsule Take 1 capsule (50,000 Units total) by mouth every 7 (seven) days.   XARELTO 20 MG TABS tablet TAKE 1 TABLET(20 MG) BY MOUTH DAILY WITH SUPPER   zonisamide (ZONEGRAN) 25 MG capsule Take 1 capsule (25 mg total) by mouth daily.   No facility-administered encounter medications on file as of 11/17/2023.     Follow-Up   Return in about 3 weeks (around 12/08/2023) for For Weight Mangement with Dr. Rikki Spearing.[name] He was informed of the importance of frequent follow up visits to maximize his success with intensive lifestyle modifications for his multiple health conditions.  Attestation Statement   Reviewed by clinician on day of visit: allergies, medications, problem list, medical history, surgical history, family history, social history, and previous encounter notes.   I have spent 40 minutes in the care of the patient today including: preparing to see patient (e.g. review and interpretation of tests, old notes ), obtaining and/or reviewing separately obtained history, performing a medically appropriate examination or evaluation, counseling and educating the patient, ordering medications, test or procedures, documenting clinical information in the electronic or other health care record, and independently interpreting results and communicating results to the patient, family, or caregiver   Worthy Rancher, MD

## 2023-11-17 NOTE — Assessment & Plan Note (Signed)
 He has lost 8 pounds since the last visit, likely aided by observing Ramadan. Concerns about post-Ramadan weight regain were addressed. Emphasis was placed on a balanced diet with a focus on protein to suppress appetite and reduce carbohydrate intake to manage insulin levels. He is not currently tracking calories but was educated on the benefits of doing so. Zonisamide was discussed as an appetite suppressant, with informed consent provided regarding its off-label use for weight management, its safety profile, and potential side effects at higher doses. - Continue 1800 calorie meal plan inspired by [place] food - Educate on balanced diet with focus on protein and reduced carbohydrates - Prescribe zonisamide 25 mg to be taken after 5 PM to help with appetite control - Send prescription to [facility] in [place]

## 2023-11-17 NOTE — Assessment & Plan Note (Signed)
 On CPAP with reported good compliance. Continue PAP therapy. Losing 15% or more of body weight may improve AHI.

## 2023-11-21 ENCOUNTER — Encounter: Payer: Self-pay | Admitting: Internal Medicine

## 2023-11-21 ENCOUNTER — Ambulatory Visit: Payer: Medicare (Managed Care) | Attending: Internal Medicine | Admitting: Internal Medicine

## 2023-11-21 VITALS — BP 144/80 | HR 69 | Ht 68.0 in | Wt 263.0 lb

## 2023-11-21 DIAGNOSIS — I480 Paroxysmal atrial fibrillation: Secondary | ICD-10-CM

## 2023-11-21 MED ORDER — RIVAROXABAN 20 MG PO TABS: 20.0000 mg | ORAL_TABLET | Freq: Every day | ORAL | 3 refills | Status: DC

## 2023-11-21 MED ORDER — METOPROLOL TARTRATE 25 MG PO TABS: ORAL_TABLET | ORAL | 3 refills | Status: DC

## 2023-11-21 MED ORDER — SILDENAFIL CITRATE 100 MG PO TABS: 100.0000 mg | ORAL_TABLET | Freq: Every day | ORAL | 3 refills | Status: AC | PRN

## 2023-11-21 NOTE — Patient Instructions (Signed)
 Medication Instructions:  Your physician recommends that you continue on your current medications as directed. Please refer to the Current Medication list given to you today.  *If you need a refill on your cardiac medications before your next appointment, please call your pharmacy*  Lab Work: None ordered.  You may go to any [facility] Location for your lab work:  [facility] - [id number] ([facility]) - [address] [phone] [address]  [place] - [address]   [place]  - [address]   [place] - [address] - [address] [employer]  - [address] [place] - [address] ([facility]   If you have labs (blood work) drawn today and your tests are completely normal, you will receive your results only by: [employer] (if you have MyChart)  If you have any lab test that is abnormal or we need to change your treatment, we will call you or send a MyChart message to review the results.  Testing/Procedures: None ordered.  Follow-Up: At [facility], you and your health needs are our priority.  As part of our continuing mission to provide you with exceptional heart care, we have created designated Provider Care Teams.  These Care Teams include your primary Cardiologist (physician) and Advanced Practice Providers (APPs -  Physician Assistants and Nurse Practitioners) who all work together to provide you with the care you need, when you need it.  We recommend signing up for the patient portal called "MyChart".  Sign up information is provided on this After Visit Summary.  MyChart is used to connect with patients for Virtual Visits (Telemedicine).  Patients are able to view lab/test results, encounter notes, upcoming appointments, etc.  Non-urgent messages can be sent to your provider as well.   To learn more about what you can do with MyChart, go to  [email or web address].    Your next appointment:   1 year(s)  The format for your next appointment:   In Person  Provider:   Lewayne Bunting, MD{or one of the following Advanced Practice Providers on your designated Care Team:   Francis Dowse, [place] Casimiro Needle "Mardelle Matte" Strum, [place] Earnest Rosier, NP  Note: Remote monitoring is used to monitor your Pacemaker/ ICD from home. This monitoring reduces the number of office visits required to check your device to one time per year. It allows [place] to keep an eye on the functioning of your device to ensure it is working properly.            Valet parking services will be available as well.

## 2023-11-21 NOTE — Progress Notes (Signed)
 HPI Mr. Tomerlin returns today for followup. He is a pleasant 68 yo man with SVT s/p ablation who developed palpitations and was found to have PAF. He has been maintained on fairly high dose flecainide which at times he has not taken. He wore a cardiac monitor several months ago and was found to have occaisional PAC's and PVC's but no sustained arrhythmias. His dyspnea is improved. He admits to being more sedentary since his left knee became a problem limiting his activity. He is considering ozempic but does not yet have actual DM so not a candidate for insurance to cover.  Allergies  Allergen Reactions   Dust Mite Extract    Pork-Derived Products Swelling   Statins     Muscle aches   Tree Extract Other (See Comments)    Intolerance      Current Outpatient Medications  Medication Sig Dispense Refill   acetaminophen (TYLENOL) 325 MG tablet Take 2 tablets (650 mg total) by mouth every 6 (six) hours.     albuterol (PROAIR HFA) 108 (90 Base) MCG/ACT inhaler Inhale 1-2 puffs into the lungs every 6 (six) hours as needed for wheezing or shortness of breath. 24 g 3   azelastine (ASTELIN) 0.1 % nasal spray 1-2 sprays per nostril 2 times daily as needed. 90 mL 3   Budeson-Glycopyrrol-Formoterol (BREZTRI AEROSPHERE) 160-9-4.8 MCG/ACT AERO Inhale 2 puffs into the lungs in the morning and at bedtime. 10.7 g 11   ezetimibe (ZETIA) 10 MG tablet Take 1 tablet (10 mg total) by mouth daily. 90 tablet 3   famotidine-calcium carbonate-magnesium hydroxide (PEPCID COMPLETE) 10-800-165 MG chewable tablet Chew by mouth.     fenofibrate (TRICOR) 145 MG tablet Take 1 tablet (145 mg total) by mouth daily. 90 tablet 3   gabapentin (NEURONTIN) 100 MG capsule TAKE 1 CAPSULE(100 MG) BY MOUTH AT BEDTIME 90 capsule 0   ibuprofen (ADVIL) 600 MG tablet Take 600 mg by mouth in the morning and at bedtime.     losartan (COZAAR) 25 MG tablet Take 1 tablet (25 mg total) by mouth daily. 90 tablet 3   Magnesium 250 MG TABS  Take 1 tablet by mouth once a week.     metFORMIN (GLUCOPHAGE) 500 MG tablet TAKE 1 TABLET(500 MG) BY MOUTH TWICE DAILY WITH A MEAL 180 tablet 3   pantoprazole (PROTONIX) 40 MG tablet Take 1 tablet (40 mg total) by mouth daily. 30 tablet 11   PRESCRIPTION MEDICATION Inhale into the lungs at bedtime. CPAP     Semaglutide-Weight Management 0.25 MG/0.5ML SOAJ Inject 0.25 mg into the skin once a week for 28 days. 2 mL 0   Spacer/Aero-Holding Chambers (AEROCHAMBER MV) inhaler Use as instructed 1 each 0   Vitamin D, Ergocalciferol, (DRISDOL) 1.25 MG (50000 UNIT) CAPS capsule Take 1 capsule (50,000 Units total) by mouth every 7 (seven) days. 8 capsule 3   zonisamide (ZONEGRAN) 25 MG capsule Take 1 capsule (25 mg total) by mouth daily. 30 capsule 0   metoprolol tartrate (LOPRESSOR) 25 MG tablet Take one tablet by mouth twice a day.  May take one additional tablet by mouth as needed for breakthrough palpitations. 270 tablet 3   rivaroxaban (XARELTO) 20 MG TABS tablet Take 1 tablet (20 mg total) by mouth daily with supper. 90 tablet 3   sildenafil (VIAGRA) 100 MG tablet Take 1 tablet (100 mg total) by mouth daily as needed for erectile dysfunction. 30 tablet 3   No current facility-administered medications for this visit.  Past Medical History:  Diagnosis Date   DDD (degenerative disc disease), lumbar    L3/L4   Diverticulosis    GERD (gastroesophageal reflux disease)    Hyperlipidemia    Low sperm motility    Pre-diabetes    Sleep apnea    uses cpap    SVT (supraventricular tachycardia) (HCC)     ROS:   All systems reviewed and negative except as noted in the HPI.   Past Surgical History:  Procedure Laterality Date   AMPUTATION Left 07/15/2018   Procedure: . REPAIR OF LEFT INDEX AND LONG FINGER BY FUSION /  REPAIR OF RING FINGER;  Surgeon: Mack Hook, MD;  Location: [facility] OR;  Service: Orthopedics;  Laterality: Left;   BACK SURGERY     BLALOCK PROCEDURE     Ablation   CARDIAC  CATHETERIZATION N/A 12/22/2015   Procedure: Left Heart Cath and Coronary Angiography;  Surgeon: Peter M Swaziland, MD;  Location: [facility] INVASIVE CV LAB;  Service: Cardiovascular;  Laterality: N/A;   CARDIAC ELECTROPHYSIOLOGY STUDY AND ABLATION     ELECTROPHYSIOLOGIC STUDY N/A 02/19/2016   Procedure: SVT Ablation;  Surgeon: Marinus Maw, MD;  Location: [facility] INVASIVE CV LAB;  Service: Cardiovascular;  Laterality: N/A;   HAND SURGERY     septal reconstruction  2017   TURBINATE REDUCTION Bilateral 07/2016   WISDOM TOOTH EXTRACTION       Family History  Problem Relation Age of Onset   Stroke Mother    Heart disease Mother    High Cholesterol Mother    Sleep apnea Father    Hypertension Father    Food Allergy Son    Colon cancer Neg Hx    Stomach cancer Neg Hx    Allergic rhinitis Neg Hx    Angioedema Neg Hx    Asthma Neg Hx    Eczema Neg Hx    Immunodeficiency Neg Hx    Urticaria Neg Hx      Social History   Socioeconomic History   Marital status: Married    Spouse name: Not on file   Number of children: 4   Years of education: Not on file   Highest education level: Associate degree: occupational, Scientist, product/process development, or vocational program  Occupational History   Occupation: retired  Tobacco Use   Smoking status: Former    Current packs/day: 0.00    Average packs/day: 1 pack/day for 10.0 years (10.0 ttl pk-yrs)    Types: Cigarettes    Start date: 21    Quit date: 1992    Years since quitting: 33.2   Smokeless tobacco: Never   Tobacco comments:    Uses Hookah Pipe occasionally  Vaping Use   Vaping status: Never Used  Substance and Sexual Activity   Alcohol use: No   Drug use: No   Sexual activity: Yes  Other Topics Concern   Not on file  Social History Narrative   Marital Status: Married (Feryal)Children:  4 (3 Sons/1 Daughter)Pets:  None Living Situation: Lives with spouse and 4 childrenOrigin:  He was born in [place].Occupation: Production designer, 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  Alcohol Use:  NoneDrug Use:  NoneDiet:  RegularExercise:  NoneHobbies:  Cards   Retired    Teacher, early years/pre Strain: High Risk (11/24/2022)   Overall Financial Resource Strain (CARDIA)    Difficulty of Paying Living Expenses: Very hard  Food Insecurity: Food Insecurity Present (11/24/2022)  Hunger Vital Sign    Worried About Running Out of Food in the Last Year: Often true    Ran Out of Food in the Last Year: Sometimes true  Transportation Needs: Patient Declined (11/24/2022)   PRAPARE - Transportation    Lack of Transportation (Medical): Patient declined    Lack of Transportation (Non-Medical): Patient declined  Physical Activity: Inactive (12/23/2021)   Exercise Vital Sign    Days of Exercise per Week: 0 days    Minutes of Exercise per Session: 0 min  Stress: No Stress Concern Present (12/23/2021)   [employer] of Occupational Health - Occupational Stress Questionnaire    Feeling of Stress : Not at all  Social Connections: Socially Integrated (11/24/2022)   Social Connection and Isolation Panel [NHANES]    Frequency of Communication with Friends and Family: More than three times a week    Frequency of Social Gatherings with Friends and Family: Twice a week    Attends Religious Services: More than 4 times per year    Active Member of [employer] or Organizations: Yes    Attends Banker Meetings: Patient declined    Marital Status: Married  Catering manager Violence: Not At Risk (12/29/2022)   Humiliation, Afraid, Rape, and Kick questionnaire    Fear of Current or Ex-Partner: No    Emotionally Abused: No    Physically Abused: No    Sexually Abused: No     BP (!) 144/80   Pulse 69   Ht 5\' 8"  (1.727 m)   Wt 263 lb (119.3 kg)   SpO2 98%   BMI 39.99 kg/m   Physical Exam:  Well appearing NAD HEENT: Unremarkable Neck:  No JVD,  no thyromegally Lymphatics:  No adenopathy Back:  No CVA tenderness Lungs:  Clear HEART:  Regular rate rhythm, no murmurs, no rubs, no clicks Abd:  soft, positive bowel sounds, no organomegally, no rebound, no guarding Ext:  2 plus pulses, no edema, no cyanosis, no clubbing Skin:  No rashes no nodules Neuro:  CN II through XII intact, motor grossly intact  Assess/Plan: PAF - he is maintaining NSR very nicely. We will follow. Continue current meds. We have refilled his scripts for flecainide, metoprol and an OAC. HTN - his bp is up. At home it is better. I encouraged him to lose weight. Obesity - he will continue his attempt at weight loss and followup in the obesity clinic. Chest pain - he has non-exertional chest pain. However he has many cardiac risk factors. We will have him undergo a perfusion stress test.  Sharlot Gowda Nitish Roes,MD

## 2023-11-23 ENCOUNTER — Encounter: Payer: Self-pay | Admitting: Family Medicine

## 2023-11-23 ENCOUNTER — Ambulatory Visit (INDEPENDENT_AMBULATORY_CARE_PROVIDER_SITE_OTHER): Payer: Medicare (Managed Care) | Admitting: Family Medicine

## 2023-11-23 VITALS — BP 112/82 | HR 64 | Temp 98.0°F | Ht 68.0 in | Wt 261.0 lb

## 2023-11-23 DIAGNOSIS — R051 Acute cough: Secondary | ICD-10-CM | POA: Diagnosis not present

## 2023-11-23 DIAGNOSIS — J069 Acute upper respiratory infection, unspecified: Secondary | ICD-10-CM | POA: Diagnosis not present

## 2023-11-23 DIAGNOSIS — R509 Fever, unspecified: Secondary | ICD-10-CM | POA: Diagnosis not present

## 2023-11-23 LAB — POCT INFLUENZA A/B
Influenza A, POC: NEGATIVE
Influenza B, POC: NEGATIVE

## 2023-11-23 LAB — POC COVID19 BINAXNOW: SARS Coronavirus 2 Ag: NEGATIVE

## 2023-11-23 MED ORDER — AZELASTINE HCL 0.1 % NA SOLN
1.0000 | Freq: Two times a day (BID) | NASAL | 3 refills | Status: DC | PRN
  Filled 2024-05-31: qty 90, 90d supply, fill #0
  Filled 2024-07-02: qty 60, 100d supply, fill #0

## 2023-11-23 MED ORDER — GUAIFENESIN ER 600 MG PO TB12: 1200.0000 mg | ORAL_TABLET | Freq: Two times a day (BID) | ORAL | 0 refills | Status: DC

## 2023-11-23 MED ORDER — BENZONATATE 200 MG PO CAPS: 200.0000 mg | ORAL_CAPSULE | Freq: Two times a day (BID) | ORAL | 0 refills | Status: DC | PRN

## 2023-11-23 NOTE — Progress Notes (Signed)
 Acute Office Visit  Subjective:     Patient ID: Richard Walker, male    DOB: Nov 08, 1955, 68 y.o.   MRN: [id number]  Chief Complaint  Patient presents with   Cough    HPI Patient is in today for URI symptoms.   Discussed the use of AI scribe software for clinical note transcription with the patient, who gave verbal consent to proceed.  History of Present Illness Richard Walker is a 68 year old male who presents with symptoms of a viral upper respiratory infection.  He has been feeling unwell since Monday afternoon after attending a festival on Sunday where he was exposed to many people. His symptoms include a severe headache, burning eyes, nasal congestion, and a sore throat with significant green mucus drainage. He experiences extreme fatigue, stating he 'can barely carry himself to the bathroom.'  He initially suspected COVID-19, but both COVID-19 and flu tests returned negative results in office today.  He experiences difficulty breathing due to nasal congestion and lung congestion. No chest pain or wheezing is reported.  He has a persistent cough, which is particularly bothersome and causing abdominal/chest muscle soreness.  He reports severe fatigue, which affects his ability to perform daily activities.  He has been using Advil and a medication similar to Mucinex both during the day and at night, with limited relief. Theraflu helps him sleep but does not significantly alleviate symptoms. He has not used his Astelin nasal spray for a long time.       ROS All review of systems negative except what is listed in the HPI      Objective:    BP 112/82   Pulse 64   Temp 98 F (36.7 C) (Oral)   Ht 5\' 8"  (1.727 m)   Wt 261 lb (118.4 kg)   SpO2 98%   BMI 39.68 kg/m    Physical Exam Vitals reviewed.  Constitutional:      General: He is not in acute distress.    Appearance: Normal appearance. He is obese.  HENT:     Head: Normocephalic and atraumatic.     Right  Ear: Tympanic membrane normal.     Left Ear: Tympanic membrane normal.     Nose: Congestion and rhinorrhea present.     Mouth/Throat:     Pharynx: Postnasal drip present. No posterior oropharyngeal erythema.  Cardiovascular:     Rate and Rhythm: Normal rate and regular rhythm.  Pulmonary:     Effort: Pulmonary effort is normal.     Breath sounds: Normal breath sounds. No wheezing, rhonchi or rales.     Comments: Dry cough Musculoskeletal:     Cervical back: Normal range of motion and neck supple.  Skin:    General: Skin is warm and dry.  Neurological:     Mental Status: He is alert and oriented to person, place, and time.  Psychiatric:        Mood and Affect: Mood normal.        Behavior: Behavior normal.        Thought Content: Thought content normal.        Judgment: Judgment normal.        Results for orders placed or performed in visit on 11/23/23  POC COVID-19 BinaxNow  Result Value Ref Range   SARS Coronavirus 2 Ag Negative Negative  POCT Influenza A/B  Result Value Ref Range   Influenza A, POC Negative Negative   Influenza B, POC Negative Negative  Assessment & Plan:   Problem List Items Addressed This Visit       Active Problems   Cough   Relevant Orders   POC COVID-19 BinaxNow (Completed)   POCT Influenza A/B (Completed)   Other Visit Diagnoses       Fever, unspecified fever cause    -  Primary   Relevant Orders   POC COVID-19 BinaxNow (Completed)   POCT Influenza A/B (Completed)     Viral URI with cough       Relevant Medications   benzonatate (TESSALON) 200 MG capsule   guaiFENesin (MUCINEX) 600 MG 12 hr tablet   azelastine (ASTELIN) 0.1 % nasal spray       Assessment & Plan Viral Upper Respiratory Infection Negative COVID-19 and influenza tests. Symptoms consistent with viral URI. - Continue Mucinex - Prescribe Astelin nasal spray recommend Flonase as well - Prescribe Tessalon - Alternate ibuprofen and Tylenol as needed -  Encourage increased fluid intake. - Advise follow-up if no improvement by next Monday or sooner if worsening    Meds ordered this encounter  Medications   benzonatate (TESSALON) 200 MG capsule    Sig: Take 1 capsule (200 mg total) by mouth 2 (two) times daily as needed for cough.    Dispense:  20 capsule    Refill:  0    Supervising Provider:   Danise Edge A [[id number]]   guaiFENesin (MUCINEX) 600 MG 12 hr tablet    Sig: Take 2 tablets (1,200 mg total) by mouth 2 (two) times daily.    Dispense:  30 tablet    Refill:  0    Supervising Provider:   Danise Edge A [[id number]]   azelastine (ASTELIN) 0.1 % nasal spray    Sig: 1-2 sprays per nostril 2 times daily as needed.    Dispense:  90 mL    Refill:  3    Supervising Provider:   Danise Edge A [[id number]]    Return if symptoms worsen or fail to improve.  Clayborne Dana, NP

## 2023-11-23 NOTE — Patient Instructions (Signed)
 Likely a viral upper respiratory infection  Continue supportive measures including rest, hydration, humidifier use, steam showers, warm compresses to sinuses, warm liquids with lemon and honey, and over-the-counter cough, cold, and analgesics as needed. If symptoms persist 8-10 days, become severe, or return after a few days of feeling better, then please follow-up for repeat evaluation to determine if antibiotics may be necessary.  Over the counter medications that may be helpful for symptoms:  Guaifenesin 1200 mg extended release tabs twice daily, with plenty of water For cough and congestion Brand name: Mucinex   Pseudoephedrine 30 mg, one or two tabs every 4 to 6 hours For sinus congestion Brand name: Sudafed You must get this from the pharmacy counter.  Oxymetazoline nasal spray each morning, one spray in each nostril, for NO MORE THAN 3 days  For nasal and sinus congestion Brand name: Afrin Saline nasal spray or Saline Nasal Irrigation (Netti Pot, etc) 3-5 times a day For nasal and sinus congestion Brand names: Ocean or AYR Fluticasone nasal spray OR Mometasone nasal spray OR Triamcinolone Acetonide nasal spray - follow directions on the packaging For nasal and sinus congestion Brand name: Flonase, Nasonex, Nasacort Warm salt water gargles  For sore throat Every few hours as needed Alternate ibuprofen 400-600 mg and acetaminophen 1000 mg every 6 hours For fever, body aches, headache Brand names: Motrin or Advil and Tylenol Dextromethorphan 12-hour cough version 30 mg every 12 hours  For cough Brand name: Delsym Stop all other cold medications for now (Nyquil, Dayquil, Tylenol Cold, Theraflu, etc) and other non-prescription cough/cold preparations. Many of these have the same ingredients listed above and could cause an overdose of medication.   Herbal treatments that have been shown to be helpful in some patients include: Vitamin C 1000 mg per day Zinc 100 mg per  day Quercetin 25-500 mg twice a day Melatonin 5-10mg  at bedtime Honey Green Tea  General Instructions Allow your body to rest Drink PLENTY of fluids Typically, we are the most contagious 1-2 days before symptoms start through the first 2-3 days of most severe symptoms. Per CDC guidelines, you can return to school/work when symptoms have started to improve and you have been fever-free for 24 hours. However, recommend you continue extra precautions for the following 5 days (frequent hand hygiene, masking, covering coughs/sneezes, minimize exposure to immunocompromised individuals, etc).  If you develop severe shortness of breath, uncontrolled fevers, coughing up blood, confusion, chest pain, or signs of dehydration (such as significantly decreased urine amounts or dizziness with standing) please go to the nearest ER.

## 2023-11-25 ENCOUNTER — Other Ambulatory Visit: Payer: Self-pay

## 2023-11-25 DIAGNOSIS — R0789 Other chest pain: Secondary | ICD-10-CM

## 2023-11-28 ENCOUNTER — Ambulatory Visit: Payer: Self-pay

## 2023-11-28 NOTE — Telephone Encounter (Addendum)
 Chief Complaint: Headache, cough/spit up of green phlegm Symptoms: see above Frequency: since last appointment last week Pertinent Negatives: Patient denies neck stiffness Disposition: [] ED /[] Urgent Care (no appt availability in office) / [x] Appointment(In office/virtual)/ []  [facility]/ [] Home Care/ [] Refused Recommended Disposition /[] [facility]/ []  Follow-up with PCP Additional Notes: Patient called in stating he was seen in office last week and his symptoms have not resolved. Patient was told to come back Monday if he did not get better. Patient states he is experiencing a cough and productive phlegm with green mucus, a headache and very slight dizziness. Patient also states his sputum has blood in it. Patient states he does not feel he needs to go to the hospital at this time but feels he needs follow up evaluation and possibly different medication. Patient states he has a pulse ox at home and it fluctuates from 92-99%. Patient appt made for tomorrow for re-evaluation and check up.    Copied from CRM [id number]. Topic: Clinical - Red Word Triage >> Nov 28, 2023  4:45 PM Taleah C wrote: Red Word that prompted transfer to Nurse Triage: covid sxs/ ad an appt last week but sxs persist, spitting out blood & blood out of nose. Reason for Disposition  [1] Sinus pain of forehead AND [2] yellow or green nasal discharge  Answer Assessment - Initial Assessment Questions 1. LOCATION: "Where does it hurt?"      Pain in front of head and left side  2. ONSET: "When did the headache start?" (Minutes, hours or days)      Since seen in office last  3. PATTERN: "Does the pain come and go, or has it been constant since it started?"     Comes and goes 4. SEVERITY: "How bad is the pain?" and "What does it keep you from doing?"  (e.g., Scale 1-10; mild, moderate, or severe)   - MILD (1-3): doesn't interfere with normal activities    - MODERATE (4-7): interferes with normal activities or  awakens from sleep    - SEVERE (8-10): excruciating pain, unable to do any normal activities        Differs 5. RECURRENT SYMPTOM: "Have you ever had headaches before?" If Yes, ask: "When was the last time?" and "What happened that time?"      N/a 6. CAUSE: "What do you think is causing the headache?"     Patient feels its likely due to sickness and congestion 7. MIGRAINE: "Have you been diagnosed with migraine headaches?" If Yes, ask: "Is this headache similar?"      N/a 8. HEAD INJURY: "Has there been any recent injury to the head?"      No  9. OTHER SYMPTOMS: "Do you have any other symptoms?" (fever, stiff neck, eye pain, sore throat, cold symptoms)  Protocols used: Headache-A-AH

## 2023-11-29 ENCOUNTER — Ambulatory Visit (INDEPENDENT_AMBULATORY_CARE_PROVIDER_SITE_OTHER): Payer: Medicare (Managed Care) | Admitting: Neurology

## 2023-11-29 ENCOUNTER — Ambulatory Visit (INDEPENDENT_AMBULATORY_CARE_PROVIDER_SITE_OTHER): Payer: Medicare (Managed Care) | Admitting: Medical

## 2023-11-29 VITALS — BP 118/74 | HR 62 | Temp 98.0°F | Resp 18 | Ht 68.0 in | Wt 260.0 lb

## 2023-11-29 DIAGNOSIS — G4733 Obstructive sleep apnea (adult) (pediatric): Secondary | ICD-10-CM

## 2023-11-29 DIAGNOSIS — J40 Bronchitis, not specified as acute or chronic: Secondary | ICD-10-CM | POA: Diagnosis not present

## 2023-11-29 DIAGNOSIS — J019 Acute sinusitis, unspecified: Secondary | ICD-10-CM | POA: Diagnosis not present

## 2023-11-29 DIAGNOSIS — R059 Cough, unspecified: Secondary | ICD-10-CM

## 2023-11-29 DIAGNOSIS — R0609 Other forms of dyspnea: Secondary | ICD-10-CM

## 2023-11-29 DIAGNOSIS — I480 Paroxysmal atrial fibrillation: Secondary | ICD-10-CM

## 2023-11-29 DIAGNOSIS — R5383 Other fatigue: Secondary | ICD-10-CM

## 2023-11-29 MED ORDER — HYDROCODONE BIT-HOMATROP MBR 5-1.5 MG/5ML PO SOLN: 5.0000 mL | Freq: Four times a day (QID) | ORAL | 0 refills | Status: DC | PRN

## 2023-11-29 MED ORDER — DOXYCYCLINE HYCLATE 100 MG PO TABS: 100.0000 mg | ORAL_TABLET | Freq: Two times a day (BID) | ORAL | 0 refills | Status: DC

## 2023-11-29 NOTE — Patient Instructions (Signed)
 Sinusitis. Symptoms suggest bacterial sinusitis with some blood tinged mucus on blowing nose. Doxycycline selected for treatment. - Prescribe doxycycline 100 mg twice daily for 10 days with food. - Continue Astelin nasal spray. - Recommend Flonase nasal spray if needed.  Bronchitis Persistent cough managed with Delsym. Hycodan offered for severe cases. - Continue Delsym. - Prescribe Hycodan liquid for severe cough, every 6 hours as needed.  Wheezing and Chest Congestion Mild symptoms managed with inhalers. Monitor for persistent wheezing. - Continue Symbicort, Breztri, and albuterol inhalers. - Consider oral steroids if wheezing persists.  Follow-up Monitor symptom progression. Further evaluation if no improvement. - Order chest x-ray if symptoms worsen or persist. - Schedule follow-up in 10-14 days if symptoms persist.

## 2023-11-29 NOTE — Progress Notes (Unsigned)
 Subjective:    Patient ID: Richard Walker, male    DOB: [birth year], 68 y.o.   MRN: [id number]  HPI Pt seen last week. Below AVS last week.   "History of Present Illness Richard Walker is a 68 year old male who presents with symptoms of a viral upper respiratory infection.   He has been feeling unwell since Monday afternoon after attending a festival on Sunday where he was exposed to many people. His symptoms include a severe headache, burning eyes, nasal congestion, and a sore throat with significant green mucus drainage. He experiences extreme fatigue.   He initially suspected COVID-19, but both COVID-19 and flu tests returned negative results in office today.   He experiences difficulty breathing due to nasal congestion and lung congestion. No chest pain or wheezing is reported.   He has a persistent cough, which is particularly bothersome and causing abdominal/chest muscle soreness. Some yellow mucus when blows nose tinged with blood and some productive couh.   He reports severe fatigue, which affects his ability to perform daily activities.   He has been using Advil and a medication similar to Mucinex both during the day and at night, with limited relief. Theraflu helps him sleep but does not significantly alleviate symptoms. He has not used his Astelin nasal spray for a long time. "    "A/P Assessment & Plan Viral Upper Respiratory Infection Negative COVID-19 and influenza tests. Symptoms consistent with viral URI. - Continue Mucinex - Prescribe Astelin nasal spray recommend Flonase as well - Prescribe Tessalon - Alternate ibuprofen and Tylenol as needed - Encourage increased fluid intake. - Advise follow-up if no improvement by next Monday or sooner if worsening"   He presents with persistent respiratory symptoms including mucus production and fatigue.  He has been experiencing persistent respiratory symptoms for approximately ten days, characterized by significant fatigue  and mucus production. Initially, fatigue was prominent but has slightly improved. Mucus production persists, with recent observation of blood mixed with mucus, particularly from the nasal passages. The mucus is described as thick and yellow, sometimes mixed with blood, and accumulates between his nose and throat, necessitating expectoration. Occasional wheezing was noted last week, along with mild chest congestion. No recent use of albuterol despite having it available.  He experiences sinus pressure without significant pain upon palpation. Additionally, he reports a burning sensation in his eyes and occasional blurry vision.  To manage his symptoms, he has been using a nasal spray and saline solution, which provide some relief. He also uses an over-the-counter cough suppressant, Delsym, which has improved his cough, although it is not completely resolved. He previously used a prescribed cough medication but found it too expensive. His inhaler regimen includes Symbicort and Breztri, with albuterol available if needed.  He request hycodan for severe cough at night.    Review of Systems  Constitutional:  Positive for fatigue. Negative for chills.  HENT:  Positive for congestion and sinus pressure. Negative for ear pain, sinus pain and sore throat.   Respiratory:  Positive for wheezing. Negative for cough.   Cardiovascular:  Negative for chest pain and palpitations.  Gastrointestinal:  Negative for abdominal pain and diarrhea.  Genitourinary:  Negative for dysuria, frequency and hematuria.  Musculoskeletal:  Negative for back pain.  Skin:  Negative for rash.  Neurological:  Negative for dizziness, weakness and headaches.  Hematological:  Negative for adenopathy. Does not bruise/bleed easily.  Psychiatric/Behavioral:  Negative for agitation and confusion.    Past Medical History:  Diagnosis Date   DDD (degenerative disc disease), lumbar    L3/L4   Diverticulosis    GERD (gastroesophageal  reflux disease)    Hyperlipidemia    Low sperm motility    Pre-diabetes    Sleep apnea    uses cpap    SVT (supraventricular tachycardia) (HCC)      Social History   Socioeconomic History   Marital status: Married    Spouse name: Not on file   Number of children: 4   Years of education: Not on file   Highest education level: Associate degree: occupational, Scientist, product/process development, or vocational program  Occupational History   Occupation: retired  Tobacco Use   Smoking status: Former    Current packs/day: 0.00    Average packs/day: 1 pack/day for 10.0 years (10.0 ttl pk-yrs)    Types: Cigarettes    Start date: 7    Quit date: 1992    Years since quitting: 33.2   Smokeless tobacco: Never   Tobacco comments:    Uses Hookah Pipe occasionally  Vaping Use   Vaping status: Never Used  Substance and Sexual Activity   Alcohol use: No   Drug use: No   Sexual activity: Yes  Other Topics Concern   Not on file  Social History Narrative   Marital Status: Married (Feryal)Children:  4 (3 Sons/1 Daughter)Pets:  None Living Situation: Lives with spouse and 4 childrenOrigin:  He was born in [place].Occupation: Production designer, 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  Alcohol Use:  NoneDrug Use:  NoneDiet:  RegularExercise:  NoneHobbies:  Cards   Retired    Teacher, early years/pre Strain: High Risk (11/24/2022)   Overall Financial Resource Strain (CARDIA)    Difficulty of Paying Living Expenses: Very hard  Food Insecurity: Food Insecurity Present (11/24/2022)   Hunger Vital Sign    Worried About Running Out of Food in the Last Year: Often true    Ran Out of Food in the Last Year: Sometimes true  Transportation Needs: Patient Declined (11/24/2022)   PRAPARE - Administrator, Civil Service (Medical): Patient declined    Lack of Transportation (Non-Medical): Patient  declined  Physical Activity: Inactive (12/23/2021)   Exercise Vital Sign    Days of Exercise per Week: 0 days    Minutes of Exercise per Session: 0 min  Stress: No Stress Concern Present (12/23/2021)   [employer] of Occupational Health - Occupational Stress Questionnaire    Feeling of Stress : Not at all  Social Connections: Socially Integrated (11/24/2022)   Social Connection and Isolation Panel [NHANES]    Frequency of Communication with Friends and Family: More than three times a week    Frequency of Social Gatherings with Friends and Family: Twice a week    Attends Religious Services: More than 4 times per year    Active Member of [employer] or Organizations: Yes    Attends Banker Meetings: Patient declined    Marital Status: Married  Catering manager Violence: Not At Risk (12/29/2022)   Humiliation, Afraid, Rape, and Kick questionnaire    Fear of Current or Ex-Partner: No    Emotionally Abused: No    Physically Abused: No    Sexually Abused: No    Past Surgical History:  Procedure Laterality Date   AMPUTATION Left 07/15/2018   Procedure: . REPAIR OF LEFT INDEX AND LONG FINGER BY FUSION /  REPAIR OF RING FINGER;  Surgeon: Mack Hook, MD;  Location: [facility];  Service: Orthopedics;  Laterality: Left;   BACK SURGERY     BLALOCK PROCEDURE     Ablation   CARDIAC CATHETERIZATION N/A 12/22/2015   Procedure: Left Heart Cath and Coronary Angiography;  Surgeon: Peter M Swaziland, MD;  Location: [facility] INVASIVE CV LAB;  Service: Cardiovascular;  Laterality: N/A;   CARDIAC ELECTROPHYSIOLOGY STUDY AND ABLATION     ELECTROPHYSIOLOGIC STUDY N/A 02/19/2016   Procedure: SVT Ablation;  Surgeon: Marinus Maw, MD;  Location: [facility] INVASIVE CV LAB;  Service: Cardiovascular;  Laterality: N/A;   HAND SURGERY     septal reconstruction  2017   TURBINATE REDUCTION Bilateral 07/2016   WISDOM TOOTH EXTRACTION      Family History  Problem Relation Age of Onset   Stroke Mother    Heart disease  Mother    High Cholesterol Mother    Sleep apnea Father    Hypertension Father    Food Allergy Son    Colon cancer Neg Hx    Stomach cancer Neg Hx    Allergic rhinitis Neg Hx    Angioedema Neg Hx    Asthma Neg Hx    Eczema Neg Hx    Immunodeficiency Neg Hx    Urticaria Neg Hx     Allergies  Allergen Reactions   Dust Mite Extract    Pork-Derived Products Swelling   Statins     Muscle aches   Tree Extract Other (See Comments)    Intolerance     Current Outpatient Medications on File Prior to Visit  Medication Sig Dispense Refill   acetaminophen (TYLENOL) 325 MG tablet Take 2 tablets (650 mg total) by mouth every 6 (six) hours.     albuterol (PROAIR HFA) 108 (90 Base) MCG/ACT inhaler Inhale 1-2 puffs into the lungs every 6 (six) hours as needed for wheezing or shortness of breath. 24 g 3   azelastine (ASTELIN) 0.1 % nasal spray 1-2 sprays per nostril 2 times daily as needed. 90 mL 3   benzonatate (TESSALON) 200 MG capsule Take 1 capsule (200 mg total) by mouth 2 (two) times daily as needed for cough. 20 capsule 0   Budeson-Glycopyrrol-Formoterol (BREZTRI AEROSPHERE) 160-9-4.8 MCG/ACT AERO Inhale 2 puffs into the lungs in the morning and at bedtime. 10.7 g 11   ezetimibe (ZETIA) 10 MG tablet Take 1 tablet (10 mg total) by mouth daily. 90 tablet 3   famotidine-calcium carbonate-magnesium hydroxide (PEPCID COMPLETE) 10-800-165 MG chewable tablet Chew by mouth.     fenofibrate (TRICOR) 145 MG tablet Take 1 tablet (145 mg total) by mouth daily. 90 tablet 3   gabapentin (NEURONTIN) 100 MG capsule TAKE 1 CAPSULE(100 MG) BY MOUTH AT BEDTIME 90 capsule 0   guaiFENesin (MUCINEX) 600 MG 12 hr tablet Take 2 tablets (1,200 mg total) by mouth 2 (two) times daily. 30 tablet 0   ibuprofen (ADVIL) 600 MG tablet Take 600 mg by mouth in the morning and at bedtime.     losartan (COZAAR) 25 MG tablet Take 1 tablet (25 mg total) by mouth daily. 90 tablet 3   Magnesium 250 MG TABS Take 1 tablet by  mouth once a week.     metFORMIN (GLUCOPHAGE) 500 MG tablet TAKE 1 TABLET(500 MG) BY MOUTH TWICE DAILY WITH A MEAL 180 tablet 3   metoprolol tartrate (LOPRESSOR) 25 MG tablet Take one tablet by mouth twice a day.  May take one additional tablet by mouth as needed for breakthrough palpitations.  270 tablet 3   pantoprazole (PROTONIX) 40 MG tablet Take 1 tablet (40 mg total) by mouth daily. 30 tablet 11   PRESCRIPTION MEDICATION Inhale into the lungs at bedtime. CPAP     rivaroxaban (XARELTO) 20 MG TABS tablet Take 1 tablet (20 mg total) by mouth daily with supper. 90 tablet 3   Semaglutide-Weight Management 0.25 MG/0.5ML SOAJ Inject 0.25 mg into the skin once a week for 28 days. 2 mL 0   sildenafil (VIAGRA) 100 MG tablet Take 1 tablet (100 mg total) by mouth daily as needed for erectile dysfunction. 30 tablet 3   Spacer/Aero-Holding Chambers (AEROCHAMBER MV) inhaler Use as instructed 1 each 0   Vitamin D, Ergocalciferol, (DRISDOL) 1.25 MG (50000 UNIT) CAPS capsule Take 1 capsule (50,000 Units total) by mouth every 7 (seven) days. 8 capsule 3   zonisamide (ZONEGRAN) 25 MG capsule Take 1 capsule (25 mg total) by mouth daily. 30 capsule 0   No current facility-administered medications on file prior to visit.    BP 118/74   Pulse 62   Temp 98 F (36.7 C)   Resp 18   Ht 5\' 8"  (1.727 m)   Wt 260 lb (117.9 kg)   SpO2 95%   BMI 39.53 kg/m        Objective:   Physical Exam  General Mental Status- Alert. General Appearance- Not in acute distress.   Skin General: Color- Normal Color. Moisture- Normal Moisture.  Neck Carotid Arteries- Normal color. Moisture- Normal Moisture. No carotid bruits. No JVD.  Chest and Lung Exam Auscultation: Breath Sounds:-Normal.  Cardiovascular Auscultation:Rythm- Regular. Murmurs & Other Heart Sounds:Auscultation of the heart reveals- No Murmurs.  Abdomen Inspection:-Inspeection Normal. Palpation/Percussion:Note:No mass. Palpation and Percussion of  the abdomen reveal- Non Tender, Non Distended + BS, no rebound or guarding.   Neurologic Cranial Nerve exam:- CN III-XII intact(No nystagmus), symmetric smile. Strength:- 5/5 equal and symmetric strength both upper and lower extremities.       Assessment & Plan:   Patient Instructions  Sinusitis. Symptoms suggest bacterial sinusitis with some blood tinged mucus on blowing nose. Doxycycline selected for treatment. - Prescribe doxycycline 100 mg twice daily for 10 days with food. - Continue Astelin nasal spray. - Recommend Flonase nasal spray if needed.  Bronchitis Persistent cough managed with Delsym. Hycodan offered for severe cases. - Continue Delsym. - Prescribe Hycodan liquid for severe cough, every 6 hours as needed.  Wheezing and Chest Congestion Mild symptoms managed with inhalers. Monitor for persistent wheezing. - Continue Symbicort, Breztri, and albuterol inhalers. - Consider oral steroids if wheezing persists.  Follow-up Monitor symptom progression. Further evaluation if no improvement. - Order chest x-ray if symptoms worsen or persist. - Schedule follow-up in 10-14 days if symptoms persist.   Esperanza Richters, PA-C

## 2023-12-06 ENCOUNTER — Encounter (INDEPENDENT_AMBULATORY_CARE_PROVIDER_SITE_OTHER): Payer: Self-pay | Admitting: Internal Medicine

## 2023-12-06 ENCOUNTER — Ambulatory Visit (INDEPENDENT_AMBULATORY_CARE_PROVIDER_SITE_OTHER): Payer: Medicare (Managed Care) | Admitting: Internal Medicine

## 2023-12-06 VITALS — BP 138/86 | HR 63 | Temp 97.8°F | Ht 68.0 in | Wt 259.0 lb

## 2023-12-06 DIAGNOSIS — E66812 Obesity, class 2: Secondary | ICD-10-CM | POA: Diagnosis not present

## 2023-12-06 DIAGNOSIS — R7303 Prediabetes: Secondary | ICD-10-CM | POA: Diagnosis not present

## 2023-12-06 DIAGNOSIS — Z6839 Body mass index (BMI) 39.0-39.9, adult: Secondary | ICD-10-CM | POA: Diagnosis not present

## 2023-12-06 DIAGNOSIS — R638 Other symptoms and signs concerning food and fluid intake: Secondary | ICD-10-CM

## 2023-12-06 DIAGNOSIS — G4733 Obstructive sleep apnea (adult) (pediatric): Secondary | ICD-10-CM

## 2023-12-06 DIAGNOSIS — E88819 Insulin resistance, unspecified: Secondary | ICD-10-CM

## 2023-12-06 MED ORDER — ZONISAMIDE 25 MG PO CAPS
50.0000 mg | ORAL_CAPSULE | Freq: Every day | ORAL | 0 refills | Status: DC
Start: 2023-12-06 — End: 2024-01-12

## 2023-12-06 NOTE — Assessment & Plan Note (Signed)
 His HOMA-IR is 5.49 which is elevated. Optimal level < 1.9.   This is complex condition associated with genetics, ectopic fat and lifestyle factors. Insulin resistance may result in increased fat storage, inhibition of the breakdown of fat, cause fluctuations in blood sugar leading to energy crashes and increased cravings for sugary or high carb foods and cause metabolic slowdown making it difficult to lose weight.  This may result in additional weight gain and lead to pre-diabetes and diabetes if untreated. In addition, hyperinsulinemia increases cardiovascular risk, chronic inflammatory response and may increase the risk of obesity related malignancies.  Lab Results  Component Value Date   HGBA1C 6.1 09/12/2023   Lab Results  Component Value Date   INSULIN 25.3 (H) 11/03/2023   Lab Results  Component Value Date   GLUCOSE 89 11/10/2023   GLUCOSE 121 (H) 11/02/2011    We reviewed treatment options which include losing 7 to 10% of body weight, increasing volume of physical activity and maintaining a diet low in saturated fats and with a low glycemic load.  Patient has also been educated on the carb insulin model of obesity.  Continue with metformin for pharmacoprophylaxis.

## 2023-12-06 NOTE — Assessment & Plan Note (Signed)
 He has increased orexigenic signaling, impaired satiety and inhibitory control. This is secondary to an abnormal energy regulation system and pathological neurohormonal pathways characteristic of excess adiposity.  In addition to nutritional and behavioral strategies he benefits from pharmacotherapy.  We again spent time today revisiting antiobesity medications.  He is really inclined to trying GLP-1 but may not have adequate coverage it is also cost prohibitive.  He would like to revisit this at the next visit

## 2023-12-06 NOTE — Assessment & Plan Note (Addendum)
 Based on most recent hemoglobin A1c of 6.0.  He is currently on metformin XR 500 mg twice a day and will continue medication.  He will continue to take B12 supplementation.  Patient was educated on the carb insulin model of obesity today and the importance of maintaining a diet with a low glycemic load.  A lot of time was spent today educating patient on the differences between simple and complex carbs and portion control using the balanced plate.  He will continue metformin for pharmacoprophylaxis.  The patient is open to referral to a dietitian he will be referred.

## 2023-12-06 NOTE — Assessment & Plan Note (Signed)
 Patient reports having a follow-up sleep study I cannot find results he is also not aware of the results.  He will follow-up with sleep specialist.  Losing 15 % of body weight may reduce AHI he may also be a candidate for Zepbound if Medicare still covering for indication.

## 2023-12-06 NOTE — Progress Notes (Signed)
 Office: [phone]  /  Fax: [phone]  Weight Summary And Biometrics  Vitals Temp: 97.8 F (36.6 C) BP: 138/86 Pulse Rate: 63 SpO2: 99 %   Anthropometric Measurements Height: 5\' 8"  (1.727 m) Weight: 259 lb (117.5 kg) BMI (Calculated): 39.39 Weight at Last Visit: 256 lb Weight Lost Since Last Visit: 0 lb Weight Gained Since Last Visit: 3 lb Starting Weight: 264 lb Total Weight Loss (lbs): 5 lb (2.268 kg) Peak Weight: 270 lb   Body Composition  Body Fat %: 35.8 % Fat Mass (lbs): 92.8 lbs Muscle Mass (lbs): 158 lbs Total Body Water (lbs): 117.4 lbs Visceral Fat Rating : 24    RMR: 2405  Today's Visit #: 3  Starting Date: 11/03/23   Subjective   Chief Complaint: Obesity  Interval History  Misplaced [place] inspired meal plan.   Discussed the use of AI scribe software for clinical note transcription with the patient, who gave verbal consent to proceed.  History of Present Illness   Richard Walker is a 68 year old male with prediabetes and sleep apnea who presents for medical weight management.  He has gained three pounds since his last visit. He is taking zonisamide in the evening at 5 PM without noticing any effect on his appetite and reports no side effects. He is also on metformin  He has a history of sleep apnea and uses a CPAP machine. He recently underwent a sleep study but has not yet received the results. The technician suggested increasing the CPAP pressure to 8.5 to reduce snoring, which may be contributing to his fatigue upon waking.  His dietary habits include consuming [place] foods, with a preference for home-cooked meals. A typical meal includes rice, meat, and vegetables, such as cabbage rolls with rice and meat. He is trying to stay under 1500 calories per day to lose weight.  He experiences difficulty with physical activity due to knee pain, described as 'bone on bone'. He is exploring chair exercises as a form of physical  activity. He has a strong appetite, even when eating properly, and is trying to limit sugary drinks, opting for water instead.  He has a history of insulin resistance and is managing his prediabetes through diet and medication. He is aware of the impact of carbohydrates on his blood sugar and is trying to incorporate more protein and fiber into his diet to help manage hunger and blood sugar levels.         Challenges affecting patient progress: strong hunger signals and/or impaired satiety / inhibitory control, having difficulty with meal prep and planning, having difficulty focusing on healthy eating, difficulty implementing reduced calorie nutrition plan, difficulty maintaining a reduced calorie state, low volume of physical activity at present , orthopedic problems, medical conditions or chronic pain affecting mobility, having difficulties with GLP-1 or AOM coverage, and patient highly interested in early pharmacotherapy .    Pharmacotherapy for weight management: He is currently taking Metformin (off label use for incretin effect and / or insulin resistance and / or diabetes prevention) without clinical response and without side effects. and Zonisamide (off label use, single agent) without clinical response and without side effects..   Assessment and Plan   Treatment Plan For Obesity:  Recommended Dietary Goals  [name] is currently in the action stage of change. As such, his goal is to continue weight management plan. He has agreed to: follow a tailored, multi-day, [place] inspired, low carbohydrate, high protein plan targeting 1500 calories and  90-120 grams of protein per day  Behavioral Health and Counseling  We discussed the following behavioral modification strategies today: increasing lean protein intake to established goals, decreasing simple carbohydrates , increasing vegetables, increasing lower glycemic fruits, increasing fiber rich foods, increasing water intake , work on  meal planning and preparation, and reading food labels .  Additional education and resources provided today: Provided with new meal plan and also category 3 packet  Recommended Physical Activity Goals  [name] has been advised to work up to 150 minutes of moderate intensity aerobic activity a week and strengthening exercises 2-3 times per week for cardiovascular health, weight loss maintenance and preservation of muscle mass.   He has agreed to :  We reviewed his physical limitations due to osteoarthritis of the knees and discussed availability of YouTube videos for low impact chair exercises.  We discussed that some is better than none  Pharmacotherapy  We discussed various medication options to help [name] with his weight loss efforts and we both agreed to : increase zonisamide to 50 mg in the evening if no clinical response after 4 weeks medication should be discontinued.  He is not a candidate for sympathomimetics because of tachyarrhythmias.  Patient is also under the impression that he will get coverage for GLP-1 but it was difficult to explain to him that Medicare does not cover these medications unless approved indications are met.  He had a sleep study done recently but results were not available he is inquiring about these.  Associated Conditions Impacted by Obesity Treatment  OSA on CPAP Assessment & Plan: Patient reports having a follow-up sleep study I cannot find results he is also not aware of the results.  He will follow-up with sleep specialist.  Losing 15 % of body weight may reduce AHI he may also be a candidate for Zepbound if Medicare still covering for indication.   Class 2 severe obesity due to excess calories with serious comorbidity and body mass index (BMI) of 35.0 to 35.9 in adult [facility]) Assessment & Plan: We spent a lot of time today educating patient on reading food labels and calorie counting.  I also provided him with another copy of a [place] inspired eating  plan.  We reviewed the difference between simple and processed carbs and he benefits from a low-carb meal plan.  Patient is also highly interested in starting antiobesity medications he feels that he cannot do it without the help we spent a lot of time educating him all of the medications doing do not and also some of the challenges with coverage and relative contraindications due to comorbidities.  We are increasing his zonisamide to 50 mg in the evening if no clinical effect medication will be discontinued after 4 weeks.  He will continue metformin for weight management and diabetes prevention  Orders: -     Zonisamide; Take 2 capsules (50 mg total) by mouth daily.  Dispense: 60 capsule; Refill: 0  Prediabetes Assessment & Plan: Based on most recent hemoglobin A1c of 6.0.  He is currently on metformin XR 500 mg twice a day and will continue medication.  He will continue to take B12 supplementation.  Patient was educated on the carb insulin model of obesity today and the importance of maintaining a diet with a low glycemic load.  A lot of time was spent today educating patient on the differences between simple and complex carbs and portion control using the balanced plate.  He will continue metformin for pharmacoprophylaxis.  The patient is open to referral to a dietitian he will be referred.   Abnormal food appetite Assessment & Plan: He has increased orexigenic signaling, impaired satiety and inhibitory control. This is secondary to an abnormal energy regulation system and pathological neurohormonal pathways characteristic of excess adiposity.  In addition to nutritional and behavioral strategies he benefits from pharmacotherapy.  We again spent time today revisiting antiobesity medications.  He is really inclined to trying GLP-1 but may not have adequate coverage it is also cost prohibitive.  He would like to revisit this at the next visit   Insulin resistance Assessment & Plan: His HOMA-IR is  5.49 which is elevated. Optimal level < 1.9.   This is complex condition associated with genetics, ectopic fat and lifestyle factors. Insulin resistance may result in increased fat storage, inhibition of the breakdown of fat, cause fluctuations in blood sugar leading to energy crashes and increased cravings for sugary or high carb foods and cause metabolic slowdown making it difficult to lose weight.  This may result in additional weight gain and lead to pre-diabetes and diabetes if untreated. In addition, hyperinsulinemia increases cardiovascular risk, chronic inflammatory response and may increase the risk of obesity related malignancies.  Lab Results  Component Value Date   HGBA1C 6.1 09/12/2023   Lab Results  Component Value Date   INSULIN 25.3 (H) 11/03/2023   Lab Results  Component Value Date   GLUCOSE 89 11/10/2023   GLUCOSE 121 (H) 11/02/2011    We reviewed treatment options which include losing 7 to 10% of body weight, increasing volume of physical activity and maintaining a diet low in saturated fats and with a low glycemic load.  Patient has also been educated on the carb insulin model of obesity.  Continue with metformin for pharmacoprophylaxis.         General Health Maintenance Educated on the importance of exercise in weight management and overall health. Due to knee issues, advised to consider chair exercises. Exercise emphasized to improve insulin resistance and overall well-being. Discussed role of muscle in burning calories and improving insulin resistance. - Recommend chair exercises as a form of physical activity. - Encourage regular physical activity to improve insulin resistance and overall health. - Unfortunately because of time restriction and patient educational needs we did not get to discuss cardiovascular risk for cancer risk today.  This will be done at a subsequent appointment time permitting  Recording duration: 28 minutes        Objective    Physical Exam:  Blood pressure 138/86, pulse 63, temperature 97.8 F (36.6 C), height 5\' 8"  (1.727 m), weight 259 lb (117.5 kg), SpO2 99%. Body mass index is 39.38 kg/m.  General: He is overweight, cooperative, alert, well developed, and in no acute distress. PSYCH: Has normal mood, affect and thought process.   HEENT: EOMI, sclerae are anicteric. Lungs: Normal breathing effort, no conversational dyspnea. Extremities: No edema.  Neurologic: No gross sensory or motor deficits. No tremors or fasciculations noted.    Diagnostic Data Reviewed:  BMET    Component Value Date/Time   NA 139 11/10/2023 1616   NA 138 11/03/2023 1117   K 5.0 11/10/2023 1616   CL 102 11/10/2023 1616   CO2 29 11/10/2023 1616   GLUCOSE 89 11/10/2023 1616   BUN 21 11/10/2023 1616   BUN 19 11/03/2023 1117   CREATININE 1.16 11/10/2023 1616   CREATININE 1.18 02/12/2016 1508   CALCIUM 10.1 11/10/2023 1616   GFRNONAA >60 09/22/2023 1405  GFRNONAA >89 03/04/2014 1456   GFRAA >60 07/15/2018 1324   GFRAA >89 03/04/2014 1456   Lab Results  Component Value Date   HGBA1C 6.1 09/12/2023   HGBA1C 5.7 12/24/2015   Lab Results  Component Value Date   INSULIN 25.3 (H) 11/03/2023   Lab Results  Component Value Date   TSH 2.04 03/10/2023   CBC    Component Value Date/Time   WBC 10.7 (H) 09/22/2023 1405   RBC 5.90 (H) 09/22/2023 1405   HGB 16.4 09/22/2023 1405   HCT 49.7 09/22/2023 1405   PLT 202 09/22/2023 1405   MCV 84.2 09/22/2023 1405   MCH 27.8 09/22/2023 1405   MCHC 33.0 09/22/2023 1405   RDW 14.2 09/22/2023 1405   Iron Studies    Component Value Date/Time   IRON 69 06/28/2018 1611   Lipid Panel     Component Value Date/Time   CHOL 248 (H) 09/12/2023 1325   TRIG 195.0 (H) 09/12/2023 1325   HDL 41.50 09/12/2023 1325   CHOLHDL 6 09/12/2023 1325   VLDL 39.0 09/12/2023 1325   LDLCALC 168 (H) 09/12/2023 1325   LDLDIRECT 150.0 03/10/2023 0951   Hepatic Function Panel     Component  Value Date/Time   PROT 7.6 11/10/2023 1616   PROT 6.9 11/03/2023 1117   ALBUMIN 4.6 11/10/2023 1616   ALBUMIN 4.3 11/03/2023 1117   AST 18 11/10/2023 1616   ALT 21 11/10/2023 1616   ALKPHOS 58 11/10/2023 1616   BILITOT 0.6 11/10/2023 1616   BILITOT 0.3 11/03/2023 1117   BILIDIR 0.1 01/06/2017 1600      Component Value Date/Time   TSH 2.04 03/10/2023 0951   Nutritional Lab Results  Component Value Date   VD25OH 35.78 09/12/2023   VD25OH 24.68 (L) 03/10/2023   VD25OH 28.99 (L) 08/31/2022    Medications: Outpatient Encounter Medications as of 12/06/2023  Medication Sig   acetaminophen (TYLENOL) 325 MG tablet Take 2 tablets (650 mg total) by mouth every 6 (six) hours.   albuterol (PROAIR HFA) 108 (90 Base) MCG/ACT inhaler Inhale 1-2 puffs into the lungs every 6 (six) hours as needed for wheezing or shortness of breath.   azelastine (ASTELIN) 0.1 % nasal spray 1-2 sprays per nostril 2 times daily as needed.   benzonatate (TESSALON) 200 MG capsule Take 1 capsule (200 mg total) by mouth 2 (two) times daily as needed for cough.   Budeson-Glycopyrrol-Formoterol (BREZTRI AEROSPHERE) 160-9-4.8 MCG/ACT AERO Inhale 2 puffs into the lungs in the morning and at bedtime.   doxycycline (VIBRA-TABS) 100 MG tablet Take 1 tablet (100 mg total) by mouth 2 (two) times daily.   ezetimibe (ZETIA) 10 MG tablet Take 1 tablet (10 mg total) by mouth daily.   famotidine-calcium carbonate-magnesium hydroxide (PEPCID COMPLETE) 10-800-165 MG chewable tablet Chew by mouth.   fenofibrate (TRICOR) 145 MG tablet Take 1 tablet (145 mg total) by mouth daily.   gabapentin (NEURONTIN) 100 MG capsule TAKE 1 CAPSULE(100 MG) BY MOUTH AT BEDTIME   guaiFENesin (MUCINEX) 600 MG 12 hr tablet Take 2 tablets (1,200 mg total) by mouth 2 (two) times daily.   HYDROcodone bit-homatropine (HYCODAN) 5-1.5 MG/5ML syrup Take 5 mLs by mouth every 6 (six) hours as needed for cough.   ibuprofen (ADVIL) 600 MG tablet Take 600 mg by mouth  in the morning and at bedtime.   losartan (COZAAR) 25 MG tablet Take 1 tablet (25 mg total) by mouth daily.   Magnesium 250 MG TABS Take 1 tablet by mouth once  a week.   metFORMIN (GLUCOPHAGE) 500 MG tablet TAKE 1 TABLET(500 MG) BY MOUTH TWICE DAILY WITH A MEAL   metoprolol tartrate (LOPRESSOR) 25 MG tablet Take one tablet by mouth twice a day.  May take one additional tablet by mouth as needed for breakthrough palpitations.   pantoprazole (PROTONIX) 40 MG tablet Take 1 tablet (40 mg total) by mouth daily.   PRESCRIPTION MEDICATION Inhale into the lungs at bedtime. CPAP   rivaroxaban (XARELTO) 20 MG TABS tablet Take 1 tablet (20 mg total) by mouth daily with supper.   Semaglutide-Weight Management 0.25 MG/0.5ML SOAJ Inject 0.25 mg into the skin once a week for 28 days.   sildenafil (VIAGRA) 100 MG tablet Take 1 tablet (100 mg total) by mouth daily as needed for erectile dysfunction.   Spacer/Aero-Holding Chambers (AEROCHAMBER MV) inhaler Use as instructed   Vitamin D, Ergocalciferol, (DRISDOL) 1.25 MG (50000 UNIT) CAPS capsule Take 1 capsule (50,000 Units total) by mouth every 7 (seven) days.   [DISCONTINUED] zonisamide (ZONEGRAN) 25 MG capsule Take 1 capsule (25 mg total) by mouth daily.   zonisamide (ZONEGRAN) 25 MG capsule Take 2 capsules (50 mg total) by mouth daily.   No facility-administered encounter medications on file as of 12/06/2023.     Follow-Up   Return in about 4 weeks (around 01/03/2024) for For Weight Mangement with Dr. Allie Area needs 40 minutes.Aaron Aas He was informed of the importance of frequent follow up visits to maximize his success with intensive lifestyle modifications for his multiple health conditions.  Attestation Statement   Reviewed by clinician on day of visit: allergies, medications, problem list, medical history, surgical history, family history, social history, and previous encounter notes.   I have spent 42 minutes in the care of the patient today including: 1  minutes before the visit reviewing the chart 28 minutes minutes face-to-face assessing and reviewing listed medical problems as outlined in obesity care plan, providing nutritional and behavioral counseling as outlined in obesity care plan, counseling regarding anti-obesity medication as outlined in obesity care plan, independently interpreting results and goals of care, see listed medical problems, and discussing biometric information and progress 13 minutes after the visit on documentation    Ladd Picker, MD

## 2023-12-06 NOTE — Assessment & Plan Note (Addendum)
 We spent a lot of time today educating patient on reading food labels and calorie counting.  I also provided him with another copy of a [place] inspired eating plan.  We reviewed the difference between simple and processed carbs and he benefits from a low-carb meal plan.  Patient is also highly interested in starting antiobesity medications he feels that he cannot do it without the help we spent a lot of time educating him all of the medications doing do not and also some of the challenges with coverage and relative contraindications due to comorbidities.  We are increasing his zonisamide to 50 mg in the evening if no clinical effect medication will be discontinued after 4 weeks.  He will continue metformin for weight management and diabetes prevention

## 2023-12-12 ENCOUNTER — Encounter: Payer: Self-pay | Admitting: Neurology

## 2023-12-12 NOTE — Procedures (Signed)
 [facility] PAP TITRATION INTERPRETATION REPORT   STUDY DATE: 11/29/2023      PATIENT NAME:  Richard Walker         DATE OF BIRTH:  1956-03-02  PATIENT ID:  [id number]    TYPE OF STUDY:  CPAP  READING PHYSICIAN: Neomia Banner, MD SCORING TECHNICIAN: Octavia Belton, RPSGT   HISTORY: This 68 year old Male patient has endorsed the Epworth Sleepiness Scale at 13 out of 24 (scores above or equal to 10 are suggestive of hypersomnolence). He is pre diabetic, has osteoarthritis with severe pain with ambulation and is a complaint CPAP user. Auto pap 6-15 cm water, ramp at 4 cm water. - yet he feels still sleepy.   We ordered a CPAP in lab titration to see if we can improve the mask fit, settings, sleep positional aspects.   DESCRIPTION: A [facility] sleep technologist was in attendance for the duration of the recording.  Data collection, scoring, video monitoring, and reporting were performed in compliance with the AASM Manual for the Scoring of Sleep and Associated Events;  ADDITIONAL INFORMATION:  Height: 69.0 in Weight: 266 lb (BMI 39) Neck Size: 19.0 in  MEDICATIONS: Tylenol , ProAir  HFA, Astelin , Tessalon , Breztri  Aerosphere, Zetia , Famvir , Pepcid, Tricor , Tambocor , Neurontin , Humibide, Advil , Xyzal , Magnesium , Glucophage , Lopressor , Singulair , Protonix , Xarelto , Revatio    SLEEP CONTINUITY AND SLEEP ARCHITECTURE:  Lights off was at 21:12: and lights on 05:05: (7.9 hours in bed). The patient received a Vitera Large FFM , starting CPAP at 7 cm water pressure.  Mild snoring was noted.   Total sleep time was only 283.5 minutes with a decreased sleep efficiency at 59.9%.  Sleep latency was increased at 90.0 minutes.   Of the total sleep time, the percentage of stage N1 sleep was 7.2%, stage N2 sleep was 69.7%, stage N3 sleep was 0.0%, and REM sleep was 23.1%. There were 5 Stage R periods observed on this study night, 18 awakenings (i.e. transitions to Stage W from any sleep  stage), and 54.0 total stage transitions.  Wake after sleep onset (WASO) time accounted for 99 minutes.  AROUSAL: There were 47 arousals in total, for an arousal index of 9.9 arousals/hour.  Of these, 4 were identified as respiratory-related arousals (0.8 /h), 3 were PLM-related arousals (0.6 /h), and 45 were non-specific arousals (9.5 /h)  RESPIRATORY MONITORING:  Based on CMS criteria (using a 4% oxygen desaturation rule for scoring hypopneas), there was 1 apnea (0 obstructive; 1 central; 0 mixed), and 2 hypopneas.   Apnea index was 0.2. Hypopnea index was 0.4.  The apnea-hypopnea index was 0.6 overall (0.0 supine, 0.9 non-supine; 0.9 REM, 0.0 supine REM). There were 0 respiratory effort-related arousals (RERAs).  The RERA index was 0.0 events/h. Total respiratory disturbance index (RDI) was 0.6 events/h. RDI results showed: supine RDI  0.0 /h; non-supine RDI 0.6 /h; REM RDI 0.9 /h, supine REM RDI 0.0 /h.   OXIMETRY: Respiratory events were associated with oxyhemoglobin desaturations to a nadir during sleep of 88 % from a mean of 94%.  Total sleep time spent at, or below 88% was 0.1 minutes, or 0.1% of total sleep time.  BODY POSITION: Duration of total sleep and percent of total sleep in their respective position is as follows: supine 00 minutes (0.0%), non-supine 283.5 minutes (100.0%); right 106 minutes (37.6%), left 177 minutes (62.4%), and prone 00 minutes (0.0%). Total supine REM sleep time was 00 minutes (0.0% of total REM sleep). LIMB MOVEMENTS: There were 14 periodic limb movements  of sleep (3.0/h), of which 3 (0.6/h) were associated with an arousal.  CARDIAC: The electrocardiogram documented cardiac arrhythmia, irregular beats, PACs, PVCs, but the majority of the recording was NSR.    The average heart rate during sleep was slow at 46 bpm.  The maximum heart rate during sleep was 60 bpm. The maximum heart rate during recording was 65 bpm.     RECOMMENDATIONS:   The patient responded  well to a VITERA large FFM. He slept poorly, mainly having trouble initiating sleep.  Under 9 cm water CPAP with 2 cm water EPR, there was  only an AHI of  less than 1/ hour present.   I recommend to use CPAP at a 9- 12  cm water pressure with 2 cm EPR , starting at 7 cm RAMP.  Additional use of oxygen will not be required.      Neomia Banner, MD            [facility] CPAP Summary General Information  Name: Richard Walker BMI: 39 Physician: ,   ID: [id number] Height: 69 in Technician: Octavia Belton  Sex: Male Weight: 266 lb Record: [id number]  Age: 19 [[birth year]] Date: 11/29/2023 Scorer: Octavia Belton   Recommended Settings IPAP: N/A cmH20 EPAP: N/A cmH2O AHI: N/A AHI (4%): N/A   Pressure IPAP/EPAP 00 07 08 09   O2 Vol 0.0 0.0 0.0 0.0  Time TRT 0.72m 150.54m 65.56m 258.3m   TST 0.69m 45.53m 18.63m 220.71m  Sleep Stage % Wake 0.0 25.0 72.3 14.5   % REM 0.0 0.0 0.0 29.7   % N1 0.0 8.9 16.7 6.1   % N2 0.0 91.1 83.3 64.2   % N3 0.0 0.0 0.0 0.0  Respiratory Total Events 0 6 1 2    Obs. Apn. 0 0 0 0   Mixed Apn. 0 0 0 0   Cen. Apn. 0 0 0 1   Hypopneas 0 6 1 1    AHI 0.00 8.00 3.33 0.54   Supine AHI 0.00 0.00 0.00 0.00   Prone AHI 0.00 0.00 0.00 0.00   Side AHI 0.00 8.00 3.33 0.54  Respiratory (4%) Hypopneas (4%) 0.00 1.00 1.00 0.00   AHI (4%) 0.00 1.33 3.33 0.27   Supine AHI (4%) 0.00 0.00 0.00 0.00   Prone AHI (4%) 0.00 0.00 0.00 0.00   Side AHI (4%) 0.00 1.33 3.33 0.27  Desat Profile <= 90% 0.71m 0.39m 3.38m 2.71m   <= 80% 0.31m 0.73m 3.23m 1.90m   <= 70% 0.47m 0.41m 3.15m 1.66m   <= 60% 0.17m 0.71m 3.71m 1.53m  Arousal Index Apnea 0.0 0.0 0.0 0.0   Hypopnea 0.0 2.7 3.3 0.3   LM 0.0 4.0 3.3 1.1   Spontaneous 0.0 16.0 13.3 7.9     General Information  Name: Richard Walker BMI: 39.28 Physician: Neomia Banner, MD  ID: [id number] Height: 69.0 in Technician: Octavia Belton, RPSGT  Sex: Male Weight: 266.0 lb Record: [id number]  Age:  28 [1956/06/25] Date: 11/29/2023     Medical & Medication History    Richard Walker is a 68 y.o. male patient who is here for revisit. He is in pain and has osteoarthritis in both knees limiting his exercise tolerance. The patient had reached an HbA1c of 6.2, before he was placed on metformin  and reduced it to 5.7. But his weight has further increased. He is still prediabetic. It is his main risk factor for sleep apnea. he does use his CPAP compliantly and he has  an average residual AHI of 0.1/h. So this patient truly has reduced his apnea significantly he uses an auto CPAP between 6 and 15 cmH2O starting with a ramp at 4 cm water pressure. He has used the machine 30 out of 30 days 99% 8 hours and 20 minutes on average. So he is a poster child for compliance. He has also very little air leakage. What I can say is that the patient is frustrated about his still present hypersomnia and sleepiness and to struggle with weight.  Tylenol , ProAir  HFA, Astelin , Tessalon , Breztri  Aerosphere, Zetia , Famvir , Pepcid, Tricor , Tambocor , Neurontin , Humibide, Advil , Xyzal , Magnesium , Glucophage , Lopressor , Singulair , Protonix , Xarelto , Revatio    Sleep Disorder      Comments   Patient arrived for a CPAP titration polysomnogram. Procedure explained and all questions answered. Patient is in good compliance using an auto CPAP between 6 and 15 cmH2O starting with a ramp at 4 cm water pressure. Patient was shown CPAP at 5cm with a large Vitera full face mask. CPAP was started at 7cm (per patient request for more pressure) using the large Vitera full face mask. Patient slept supine, right, and left. Mild snoring was heard. Respiratory events observed. CPAP was increased to 9cm in an effort to control respiratory events and abolish snoring. EPR at 2 was used for patient comfort. Cardiac arrhythmias observed. Patient has known cardiac issues, including paroxysmal atrial fibrillation, PAC's, and PVC's. No significant PLMS observed. One  restroom visit.    CPAP start time: 09:12:11 PM CPAP end time: 05:05:22 AM   Time Total Supine Side Prone Upright  Recording (TRT) 7h 53.3m 1h 12.77m 6h 41.34m 0h 0.20m 0h 0.106m  Sleep (TST) 4h 43.85m 0h 0.75m 4h 43.51m 0h 0.66m 0h 0.45m   Latency N1 N2 N3 REM Onset Per. Slp. Eff.  Actual 1h 30.38m 1h 30.63m 0h 0.30m 2h 11.66m 1h 30.48m 1h 30.12m 59.94%   Stg Dur Wake N1 N2 N3 REM  Total 189.5 20.5 197.5 0.0 65.5  Supine 72.0 0.0 0.0 0.0 0.0  Side 117.5 20.5 197.5 0.0 65.5  Prone 0.0 0.0 0.0 0.0 0.0  Upright 0.0 0.0 0.0 0.0 0.0   Stg % Wake N1 N2 N3 REM  Total 40.1 7.2 69.7 0.0 23.1  Supine 15.2 0.0 0.0 0.0 0.0  Side 24.8 7.2 69.7 0.0 23.1  Prone 0.0 0.0 0.0 0.0 0.0  Upright 0.0 0.0 0.0 0.0 0.0     Apnea Summary Sub Supine Side Prone Upright  Total 1 Total 1 0 1 0 0    REM 1 0 1 0 0    NREM 0 0 0 0 0  Obs 0 REM 0 0 0 0 0    NREM 0 0 0 0 0  Mix 0 REM 0 0 0 0 0    NREM 0 0 0 0 0  Cen 1 REM 1 0 1 0 0    NREM 0 0 0 0 0   Rera Summary Sub Supine Side Prone Upright  Total 0 Total 0 0 0 0 0    REM 0 0 0 0 0    NREM 0 0 0 0 0   Hypopnea Summary Sub Supine Side Prone Upright  Total 8 Total 8 0 8 0 0    REM 0 0 0 0 0    NREM 8 0 8 0 0   4% Hypopnea Summary Sub Supine Side Prone Upright  Total (4%) 2 Total 2 0 2 0 0    REM  0 0 0 0 0    NREM 2 0 2 0 0     AHI Total Obs Mix Cen  1.90 Apnea 0.21 0.00 0.00 0.21   Hypopnea 1.69 -- -- --  0.63 Hypopnea (4%) 0.42 -- -- --    Total Supine Side Prone Upright  Position AHI 1.90 0.00 1.90 0.00 0.00  REM AHI 0.92   NREM AHI 2.20   Position RDI 1.90 0.00 1.90 0.00 0.00  REM RDI 0.92   NREM RDI 2.20    4% Hypopnea Total Supine Side Prone Upright  Position AHI (4%) 0.63 0.00 0.63 0.00 0.00  REM AHI (4%) 0.92   NREM AHI (4%) 0.55   Position RDI (4%) 0.63 0.00 0.63 0.00 0.00  REM RDI (4%) 0.92   NREM RDI (4%) 0.55    Desaturation Information  <100% <90% <80% <70% <60% <50% <40%  Supine 2 2 0 0 0 0 0  Side 13 3 0 0 0 0 0  Prone 0 0 0  0 0 0 0  Upright 0 0 0 0 0 0 0  Total 15 5 0 0 0 0 0  Desaturation threshold setting: 4% Minimum desaturation setting: 10 seconds SaO2 nadir: 65% The longest event was a 32 sec obstructive Hypopnea with a minimum SaO2 of 92%. The lowest SaO2 was 89% associated with a 17 sec obstructive Hypopnea. EKG Rates EKG Avg Max Min  Awake 50 65 44  Asleep 46 60 41  EKG Events: N/A Awakening/Arousal Information # of Awakenings 18  Wake after sleep onset 99.34m  Wake after persistent sleep 99.16m   Arousal Assoc. Arousals Index  Apneas 0 0.0  Hypopneas 4 0.8  Leg Movements 8 1.7  Snore 0.0 0.0  PTT Arousals 0 0.0  Spontaneous 45 9.5  Total 57 12.1  Myoclonus Information PLMS LMs Index  Total LMs during PLMS 14 3.0  LMs w/ Microarousals 3 0.6   LM LMs Index  w/ Microarousal 5 1.1  w/ Awakening 2 0.4  w/ Resp Event 0 0.0  Spontaneous 20 4.2  Total 25 5.3

## 2023-12-12 NOTE — Addendum Note (Signed)
 Addended by: Neomia Banner on: 12/12/2023 07:01 PM   Modules accepted: Orders

## 2023-12-13 ENCOUNTER — Encounter: Payer: Self-pay | Admitting: Neurology

## 2023-12-30 ENCOUNTER — Encounter: Payer: Self-pay | Admitting: Orthopedic Surgery

## 2023-12-30 ENCOUNTER — Other Ambulatory Visit: Payer: Self-pay

## 2023-12-30 ENCOUNTER — Telehealth: Payer: Self-pay

## 2023-12-30 ENCOUNTER — Ambulatory Visit (INDEPENDENT_AMBULATORY_CARE_PROVIDER_SITE_OTHER): Payer: Medicare (Managed Care) | Admitting: Orthopedic Surgery

## 2023-12-30 VITALS — Ht 68.0 in | Wt 260.0 lb

## 2023-12-30 DIAGNOSIS — M25562 Pain in left knee: Secondary | ICD-10-CM | POA: Diagnosis not present

## 2023-12-30 DIAGNOSIS — M25561 Pain in right knee: Secondary | ICD-10-CM | POA: Diagnosis not present

## 2023-12-30 NOTE — Progress Notes (Unsigned)
 Office Visit Note   Patient: Richard Walker           Date of Birth: Nov 06, 1955           MRN: [id number] Visit Date: 12/30/2023 Requested by: Sylvia Everts, PA-C [id number] [place],  [place] [address] PCP: Francine Iron  Subjective: Chief Complaint  Patient presents with   Right Knee - Pain   Left Knee - Pain    HPI: Richard Walker is a 68 y.o. male who presents to the office reporting bilateral chronic knee pain.  Patient states he has seen multiple orthopedic surgeons but he is looking for help.  He states that he "needs to be able to move".  Denies any locking but does report stiffness and swelling in the knees.  He likes to walk.  Symptoms ongoing for 2 to 3 years.  Patient states that he gained weight after retirement.  Does not want to do any type of steroid injection because of concern over weight gain.  He is currently being seen in the weight and wellness clinic.  Was recommended for Synvisc injections but his insurance would not pay for Synvisc specifically.  He does report decreased standing endurance and has difficulty kneeling as well as getting up from kneeling which is likely the bigger problem.  He has an upstairs bedroom.  Does use sleeves which do not help much.  Does have wife at home.  Takes Xarelto  for atrial fibrillation.  Radiographs from 2024 are reviewed and they do show end-stage tricompartmental arthritis worse in the medial compartments of both knees.  Current BMI is 39.53..                ROS: All systems reviewed are negative as they relate to the chief complaint within the history of present illness.  Patient denies fevers or chills.  Assessment & Plan: Visit Diagnoses:  1. Pain in both knees, unspecified chronicity     Plan: Impression is bilateral knee arthritis primarily involving the medial compartment.  Does have good range of motion and no effusion in either knee.  BMI is close to disqualifying for elective knee replacement.   Plan at this time is to try gel injection.  Which ever gel injection his insurance approves we can use.  I think it would be worth a try before any type of knee replacement surgery.  He would need cardiac risk stratification prior to surgery.  The risk and benefits of surgery are discussed with the patient including not limited to infection nerve and vessel damage knee stiffness incomplete pain relief and in his case I think there is a chance in the likelihood that he would not be able to gain full flexion enough to participate in daily prayer in a manner which would be ideal for him.  We are going to try the gel injections first and see if that helps.  Follow-up in 3 weeks for those injections.  This patient is diagnosed with osteoarthritis of the knee(s).    Radiographs show evidence of joint space narrowing, osteophytes, subchondral sclerosis and/or subchondral cysts.  This patient has knee pain which interferes with functional and activities of daily living.    This patient has experienced inadequate response, adverse effects and/or intolerance with conservative treatments such as acetaminophen , NSAIDS, topical creams, physical therapy or regular exercise, knee bracing and/or weight loss.   This patient has experienced inadequate response or has a contraindication to intra articular steroid injections  for at least 3 months.   This patient is not scheduled to have a total knee replacement within 6 months of starting treatment with viscosupplementation.   Follow-Up Instructions: No follow-ups on file.   Orders:  No orders of the defined types were placed in this encounter.  No orders of the defined types were placed in this encounter.     Procedures: No procedures performed   Clinical Data: No additional findings.  Objective: Vital Signs: Ht 5\' 8"  (1.727 m)   Wt 260 lb (117.9 kg)   BMI 39.53 kg/m   Physical Exam:  Constitutional: Patient appears well-developed HEENT:  Head:  Normocephalic Eyes:EOM are normal Neck: Normal range of motion Cardiovascular: Normal rate Pulmonary/chest: Effort normal Neurologic: Patient is alert Skin: Skin is warm Psychiatric: Patient has normal mood and affect  Ortho Exam: Ortho exam demonstrates full range of motion and no effusion in both knees.  Pedal pulses palpable.  Ankle dorsiflexion intact bilaterally.  There is no groin pain with internal or external rotation of either leg.  Does have medial greater than lateral joint line tenderness bilaterally.  Collateral and cruciate ligaments are stable.  Specialty Comments:  No specialty comments available.  Imaging: No results found.   PMFS History: Patient Active Problem List   Diagnosis Date Noted   Abnormal food appetite 11/17/2023   Insulin  resistance 11/17/2023   Class 2 severe obesity due to excess calories with serious comorbidity and body mass index (BMI) of 35.0 to 35.9 in adult (HCC) 11/03/2023   Elevated blood sugar 11/03/2023   Prediabetes 11/03/2023   Degenerative disc disease, lumbar 12/09/2022   Morbid obesity (HCC) 10/26/2022   Bronchitis 01/29/2022   OSA on CPAP 02/19/2021   Bite, snake 01/28/2021   Neck pain 11/13/2020   Vertigo 11/13/2020   Hypertension 11/03/2020   Contusion of bone 10/23/2020   Degenerative arthritis of knee, bilateral 10/23/2020   Concussion with no loss of consciousness 10/23/2020   Paroxysmal atrial fibrillation (HCC) 05/22/2019   Unspecified atrial flutter (HCC) 05/22/2019   Pain in right foot 03/30/2018   Perennial and seasonal allergic rhinitis 11/09/2017   History of food allergy 11/09/2017   Wheezing 11/09/2017   Dyspnea/wheezing 10/31/2017   Cough 10/31/2017   Bronchitis, mucopurulent recurrent (HCC) 09/07/2017   SVT (supraventricular tachycardia) (HCC) 04/23/2015   Abdominal pain, epigastric 06/03/2013   Vitamin D  deficiency 04/09/2013   Esophageal reflux 04/09/2013   Hepatitis B carrier (HCC) 04/09/2013   Other  and unspecified hyperlipidemia 04/09/2013   Sleep apnea 04/09/2013   Past Medical History:  Diagnosis Date   DDD (degenerative disc disease), lumbar    L3/L4   Diverticulosis    GERD (gastroesophageal reflux disease)    Hyperlipidemia    Low sperm motility    Pre-diabetes    Sleep apnea    uses cpap    SVT (supraventricular tachycardia) (HCC)     Family History  Problem Relation Age of Onset   Stroke Mother    Heart disease Mother    High Cholesterol Mother    Sleep apnea Father    Hypertension Father    Food Allergy Son    Colon cancer Neg Hx    Stomach cancer Neg Hx    Allergic rhinitis Neg Hx    Angioedema Neg Hx    Asthma Neg Hx    Eczema Neg Hx    Immunodeficiency Neg Hx    Urticaria Neg Hx     Past Surgical History:  Procedure Laterality Date  AMPUTATION Left 07/15/2018   Procedure: . REPAIR OF LEFT INDEX AND LONG FINGER BY FUSION /  REPAIR OF RING FINGER;  Surgeon: Rober Chimera, MD;  Location: [facility] OR;  Service: Orthopedics;  Laterality: Left;   BACK SURGERY     BLALOCK PROCEDURE     Ablation   CARDIAC CATHETERIZATION N/A 12/22/2015   Procedure: Left Heart Cath and Coronary Angiography;  Surgeon: Peter M Swaziland, MD;  Location: [facility] INVASIVE CV LAB;  Service: Cardiovascular;  Laterality: N/A;   CARDIAC ELECTROPHYSIOLOGY STUDY AND ABLATION     ELECTROPHYSIOLOGIC STUDY N/A 02/19/2016   Procedure: SVT Ablation;  Surgeon: Tammie Fall, MD;  Location: [facility] INVASIVE CV LAB;  Service: Cardiovascular;  Laterality: N/A;   HAND SURGERY     septal reconstruction  2017   TURBINATE REDUCTION Bilateral 07/2016   WISDOM TOOTH EXTRACTION     Social History   Occupational History   Occupation: retired  Tobacco Use   Smoking status: Former    Current packs/day: 0.00    Average packs/day: 1 pack/day for 10.0 years (10.0 ttl pk-yrs)    Types: Cigarettes    Start date: 73    Quit date: 1992    Years since quitting: 33.3   Smokeless tobacco: Never   Tobacco comments:     Uses Hookah Pipe occasionally  Vaping Use   Vaping status: Never Used  Substance and Sexual Activity   Alcohol use: No   Drug use: No   Sexual activity: Yes

## 2023-12-30 NOTE — Telephone Encounter (Signed)
 Auth needed for bil knee gel

## 2024-01-01 ENCOUNTER — Other Ambulatory Visit (INDEPENDENT_AMBULATORY_CARE_PROVIDER_SITE_OTHER): Payer: Self-pay | Admitting: Internal Medicine

## 2024-01-01 ENCOUNTER — Encounter: Payer: Self-pay | Admitting: Orthopedic Surgery

## 2024-01-01 DIAGNOSIS — E66812 Obesity, class 2: Secondary | ICD-10-CM

## 2024-01-02 ENCOUNTER — Telehealth: Payer: Self-pay

## 2024-01-02 NOTE — Telephone Encounter (Signed)
-----   Message from Marykay Snipes sent at 01/01/2024 10:49 AM EDT ----- Richard Walker.  Please follow-up 3 weeks for injection thanks

## 2024-01-02 NOTE — Telephone Encounter (Signed)
 Needs appt

## 2024-01-04 NOTE — Telephone Encounter (Signed)
 Per appt records, patient has one scheduled for 05/29

## 2024-01-12 ENCOUNTER — Encounter (INDEPENDENT_AMBULATORY_CARE_PROVIDER_SITE_OTHER): Payer: Self-pay | Admitting: Internal Medicine

## 2024-01-12 ENCOUNTER — Ambulatory Visit (INDEPENDENT_AMBULATORY_CARE_PROVIDER_SITE_OTHER): Payer: Medicare (Managed Care) | Admitting: Internal Medicine

## 2024-01-12 ENCOUNTER — Other Ambulatory Visit (HOSPITAL_BASED_OUTPATIENT_CLINIC_OR_DEPARTMENT_OTHER): Payer: Self-pay

## 2024-01-12 VITALS — BP 103/66 | HR 70 | Temp 98.7°F | Ht 68.0 in | Wt 252.0 lb

## 2024-01-12 DIAGNOSIS — R7303 Prediabetes: Secondary | ICD-10-CM | POA: Diagnosis not present

## 2024-01-12 DIAGNOSIS — I10 Essential (primary) hypertension: Secondary | ICD-10-CM

## 2024-01-12 DIAGNOSIS — G4733 Obstructive sleep apnea (adult) (pediatric): Secondary | ICD-10-CM | POA: Diagnosis not present

## 2024-01-12 DIAGNOSIS — K760 Fatty (change of) liver, not elsewhere classified: Secondary | ICD-10-CM

## 2024-01-12 DIAGNOSIS — E88819 Insulin resistance, unspecified: Secondary | ICD-10-CM

## 2024-01-12 DIAGNOSIS — Z6835 Body mass index (BMI) 35.0-35.9, adult: Secondary | ICD-10-CM

## 2024-01-12 DIAGNOSIS — E66812 Obesity, class 2: Secondary | ICD-10-CM

## 2024-01-12 DIAGNOSIS — R638 Other symptoms and signs concerning food and fluid intake: Secondary | ICD-10-CM

## 2024-01-12 MED ORDER — ZONISAMIDE 25 MG PO CAPS: 50.0000 mg | ORAL_CAPSULE | Freq: Every day | ORAL | 0 refills | Status: DC

## 2024-01-12 MED ORDER — TIRZEPATIDE-WEIGHT MANAGEMENT 2.5 MG/0.5ML ~~LOC~~ SOAJ
2.5000 mg | SUBCUTANEOUS | 0 refills | Status: DC
  Filled 2024-01-12 – 2024-01-19 (×2): qty 2, 28d supply, fill #0

## 2024-01-12 NOTE — Progress Notes (Signed)
 Office: [phone]  /  Fax: [phone]  Weight Summary And Biometrics  Vitals Temp: 98.7 F (37.1 C) BP: 103/66 Pulse Rate: 70 SpO2: 97 %   Anthropometric Measurements Height: 5\' 8"  (1.727 m) Weight: 252 lb (114.3 kg) BMI (Calculated): 38.33 Weight at Last Visit: 259lb Weight Lost Since Last Visit: 7lb Weight Gained Since Last Visit: 0lb Starting Weight: 264lb Total Weight Loss (lbs): 12 lb (5.443 kg) Peak Weight: 270lb   Body Composition  Body Fat %: 27.4 % Fat Mass (lbs): 69 lbs Muscle Mass (lbs): 174 lbs Total Body Water (lbs): 118 lbs Visceral Fat Rating : 19    RMR: 2405  Today's Visit #: 4  Starting Date: 11/03/23   Subjective   Chief Complaint: Obesity  Interval History Discussed the use of AI scribe software for clinical note transcription with the patient, who gave verbal consent to proceed.  History of Present Illness   Richard Walker is a 68 year old male who is affected by obesity has a history of tachyarrhythmias, severe sleep apnea, hypertension, prediabetes with insulin  resistance, MASLD, advanced osteoarthritis of the left knee with impaired mobility and abnormal food appetite with impaired inhibitory control who presents for medical weight management.  Because of tachyarrhythmias he has contraindications to sympathomimetics.  He is currently on metformin  for diabetes prevention and weight management.  He has also been on zonisamide  25 mg twice a day and has been on medication since March of this year thus far he has lost 12 pounds but is having difficulties with impaired satiety, hunger and cravings.  At last office visit we provided him with a [place] inspired reduced calorie nutrition plan which she has found helpful.  He has reduced carbohydrates and has also been doing chair exercises.  He is quite limited in the upright position due to advanced osteoarthritis of the left knee and is currently undergoing treatment with injections and  is possibly considering joint replacement.  He has a history of severe sleep apnea with an AHI of 50 based on a sleep study from July 2022 with oxygen desaturations.  He is currently on PAP therapy and this has been recently adjusted but despite new pressures he is still symptomatic.  He reports problems with daytime fatigue, somnolence and nonrestorative sleep.  He inquires about GLP-1 therapy to assist with his weight loss.  He has prediabetes and high insulin  resistance, with a HOMA-IR score of 5.49. He is currently on metformin  for this condition.  He also has a diagnosis of MASLD, confirmed by a CT scan in January 2025.  Nutritionally, he has been following dietary recommendations, increasing his intake of vegetables and reducing bread and rice. He occasionally consumes yogurt, carrots, and cucumbers when hungry. He misses bread and rice but is trying to switch to brown rice or burlap. He engages in chair exercises and tries to stay active by helping with outdoor tasks.      Challenges affecting patient progress: strong hunger signals and/or impaired satiety / inhibitory control, orthopedic problems, medical conditions or chronic pain affecting mobility, and medical comorbidities.    Pharmacotherapy for weight management: He is currently taking Metformin  (off label use for incretin effect and / or insulin  resistance and / or diabetes prevention) with adequate clinical response  and without side effects. and Zonisamide  (off label use, single agent) with adequate clinical response  and without side effects..   Assessment and Plan   Treatment Plan For Obesity:  Recommended Dietary Goals  Bowen is  currently in the action stage of change. As such, his goal is to continue weight management plan. He has agreed to: continue current plan.  Following 7-day, reduced calorie [place] meal plan  Behavioral Health and Counseling  We discussed the following behavioral modification strategies  today: increasing lean protein intake to established goals, increasing water intake , and continue to work on maintaining a reduced calorie state, getting the recommended amount of protein, incorporating whole foods, making healthy choices, staying well hydrated and practicing mindfulness when eating..  Additional education and resources provided today: None  Recommended Physical Activity Goals  Eman has been advised to work up to 150 minutes of moderate intensity aerobic activity a week and strengthening exercises 2-3 times per week for cardiovascular health, weight loss maintenance and preservation of muscle mass.   He has agreed to :  continue to gradually increase the amount and intensity of exercise routine  Pharmacotherapy  We discussed various medication options to help Yordan with his weight loss efforts and we both agreed to : start anti-obesity medication.  In addition to reduced calorie nutrition plan (RCNP), behavioral strategies and physical activity, Melanie would benefit from pharmacotherapy to assist with hunger signals, satiety and cravings. This will reduce obesity-related health risks by inducing weight loss, and help reduce food consumption and adherence to [facility]) . It may also improve QOL by improving self-confidence and reduce the  setbacks associated with metabolic adaptations.  Has multiple high risk comorbidities associated to his obesity.  These include tachyarrhythmias, severe sleep apnea with an AHI of 50 PAP therapy, hypertension, prediabetes with insulin  resistance, MASLD, advanced osteoarthritis of the left knee with impaired mobility and abnormal food appetite with impaired inhibitory control.  Treatment with Zepbound would be of benefit as it would help patient lose weight but also reduce the risk of cardiovascular complications, delay disease progression and reduce the risk of complications associated with obesity related comorbidities.  After discussion of treatment  options, mechanisms of action, benefits, side effects, contraindications and shared decision making he is agreeable to starting Zepbound 2.5 mg once a week. Patient also made aware that medication is indicated for long-term management of obesity and the risk of weight regain following discontinuation of treatment and hence the importance of adhering to medical weight loss plan.  We demonstrated use of device and patient using teach back method was able to demonstrate proper technique.  Associated Conditions Impacted by Obesity Treatment  OSA on CPAP Assessment & Plan: Reviewed sleep study from July 2022 he had an AHI of 50 with oxygen desaturations he is currently on PAP therapy but still has an elevated Epworth around 13 despite adjustments in pressures.  I feel that patient will benefit from weight loss aided by GLP-1 to reduce AHI and symptoms related to sleep disordered breathing.  It would also help with the management of other obesity related comorbidities as outlined in obesity care plan.  Orders: -     Tirzepatide-Weight Management; Inject 2.5 mg into the skin once a week.  Dispense: 2 mL; Refill: 0  Prediabetes Assessment & Plan: Based on most recent hemoglobin A1c of 6.0.  He is currently on metformin  XR 500 mg twice a day and will continue medication.  He will continue to take B12 supplementation.  Patient was educated on the carb insulin  model of obesity today and the importance of maintaining a diet with a low glycemic load.  He has reduced simple carbs in his diet significantly and has been eating more  fruits and vegetables.  I congratulated him on his efforts.   Abnormal food appetite Assessment & Plan: He has increased orexigenic signaling, impaired satiety and inhibitory control. This is secondary to an abnormal energy regulation system and pathological neurohormonal pathways characteristic of excess adiposity.  In addition to nutritional and behavioral strategies he benefits from  pharmacotherapy.    He is currently on metformin  and has been on zonisamide  for 2 months but has not noticed an improvement in orixegenic signaling.  He would benefit from GLP-1 therapy.  He will be prescribed Zepbound 2.5 mg once a week   Class 2 severe obesity due to excess calories with serious comorbidity and body mass index (BMI) of 35.0 to 35.9 in adult [facility]) Assessment & Plan: Patient has lost 12 pounds since starting our program he is following a [place] reduced calorie nutrition plan.  He is also on metformin  and zonisamide  for pharmacotherapy but has abnormal orexigenic signaling characteristic of excessive adiposity and abnormal energy regulation system.  He benefits from pharmacotherapy in this case GLP-1 as he has multiple high risk obesity related comorbidities as outlined in note.  He will be prescribed Zepbound 2.5 mg once a week  Orders: -     Tirzepatide-Weight Management; Inject 2.5 mg into the skin once a week.  Dispense: 2 mL; Refill: 0 -     Zonisamide ; Take 2 capsules (50 mg total) by mouth daily.  Dispense: 60 capsule; Refill: 0  Insulin  resistance Assessment & Plan: His HOMA-IR is 5.49 which is elevated. Optimal level < 1.9.   This is complex condition associated with genetics, ectopic fat and lifestyle factors. Insulin  resistance may result in increased fat storage, inhibition of the breakdown of fat, cause fluctuations in blood sugar leading to energy crashes and increased cravings for sugary or high carb foods and cause metabolic slowdown making it difficult to lose weight.  This may result in additional weight gain and lead to pre-diabetes and diabetes if untreated. In addition, hyperinsulinemia increases cardiovascular risk, chronic inflammatory response and may increase the risk of obesity related malignancies.  Lab Results  Component Value Date   HGBA1C 6.1 09/12/2023   Lab Results  Component Value Date   INSULIN  25.3 (H) 11/03/2023   Lab Results   Component Value Date   GLUCOSE 89 11/10/2023   GLUCOSE 121 (H) 11/02/2011    We reviewed treatment options which include losing 7 to 10% of body weight, increasing volume of physical activity and maintaining a diet low in saturated fats and with a low glycemic load.  Patient has also been educated on the carb insulin  model of obesity.  Continue with metformin  for pharmacoprophylaxis.  Start Zepbound 2.5 mg once a week    Primary hypertension Assessment & Plan: Blood pressure at goal for age and risk category.  On losartan  and metoprolol  without adverse effects.  Metoprolol  may cause weight gain but needed for management of his tachyarrhythmia.  Continue with weight loss therapy. Losing 10% may improve blood pressure control. Monitor for symptoms of orthostasis while losing weight. Continue current regimen and home monitoring for a goal blood pressure of 120/80.  Losartan  and metoprolol    Metabolic dysfunction-associated steatotic liver disease (MASLD) Assessment & Plan: He has hepatic steatosis on CT scan completed in January of this year.  Fibrosis 4 Score = 1.3  He does not drink alcohol and is not on steatogenic medication.  Losing 15% of body weight may improve condition.  He is also working on reducing saturated  fats simple and processed sugars in his diet and is engaging in physical activity.  GLP-1 may also help reduce the risk of fibrosis.           Objective   Physical Exam:  Blood pressure 103/66, pulse 70, temperature 98.7 F (37.1 C), height 5\' 8"  (1.727 m), weight 252 lb (114.3 kg), SpO2 97%. Body mass index is 38.32 kg/m.  General: He is overweight, cooperative, alert, well developed, and in no acute distress. PSYCH: Has normal mood, affect and thought process.   HEENT: EOMI, sclerae are anicteric. Lungs: Normal breathing effort, no conversational dyspnea. Extremities: No edema.  Neurologic: No gross sensory or motor deficits. No tremors or  fasciculations noted.    Diagnostic Data Reviewed:  BMET    Component Value Date/Time   NA 139 11/10/2023 1616   NA 138 11/03/2023 1117   K 5.0 11/10/2023 1616   CL 102 11/10/2023 1616   CO2 29 11/10/2023 1616   GLUCOSE 89 11/10/2023 1616   BUN 21 11/10/2023 1616   BUN 19 11/03/2023 1117   CREATININE 1.16 11/10/2023 1616   CREATININE 1.18 02/12/2016 1508   CALCIUM  10.1 11/10/2023 1616   GFRNONAA >60 09/22/2023 1405   GFRNONAA >89 03/04/2014 1456   GFRAA >60 07/15/2018 1324   GFRAA >89 03/04/2014 1456   Lab Results  Component Value Date   HGBA1C 6.1 09/12/2023   HGBA1C 5.7 12/24/2015   Lab Results  Component Value Date   INSULIN  25.3 (H) 11/03/2023   Lab Results  Component Value Date   TSH 2.04 03/10/2023   CBC    Component Value Date/Time   WBC 10.7 (H) 09/22/2023 1405   RBC 5.90 (H) 09/22/2023 1405   HGB 16.4 09/22/2023 1405   HCT 49.7 09/22/2023 1405   PLT 202 09/22/2023 1405   MCV 84.2 09/22/2023 1405   MCH 27.8 09/22/2023 1405   MCHC 33.0 09/22/2023 1405   RDW 14.2 09/22/2023 1405   Iron Studies    Component Value Date/Time   IRON 69 06/28/2018 1611   Lipid Panel     Component Value Date/Time   CHOL 248 (H) 09/12/2023 1325   TRIG 195.0 (H) 09/12/2023 1325   HDL 41.50 09/12/2023 1325   CHOLHDL 6 09/12/2023 1325   VLDL 39.0 09/12/2023 1325   LDLCALC 168 (H) 09/12/2023 1325   LDLDIRECT 150.0 03/10/2023 0951   Hepatic Function Panel     Component Value Date/Time   PROT 7.6 11/10/2023 1616   PROT 6.9 11/03/2023 1117   ALBUMIN 4.6 11/10/2023 1616   ALBUMIN 4.3 11/03/2023 1117   AST 18 11/10/2023 1616   ALT 21 11/10/2023 1616   ALKPHOS 58 11/10/2023 1616   BILITOT 0.6 11/10/2023 1616   BILITOT 0.3 11/03/2023 1117   BILIDIR 0.1 01/06/2017 1600      Component Value Date/Time   TSH 2.04 03/10/2023 0951   Nutritional Lab Results  Component Value Date   VD25OH 35.78 09/12/2023   VD25OH 24.68 (L) 03/10/2023   VD25OH 28.99 (L) 08/31/2022     Medications: Outpatient Encounter Medications as of 01/12/2024  Medication Sig   acetaminophen  (TYLENOL ) 325 MG tablet Take 2 tablets (650 mg total) by mouth every 6 (six) hours.   albuterol  (PROAIR  HFA) 108 (90 Base) MCG/ACT inhaler Inhale 1-2 puffs into the lungs every 6 (six) hours as needed for wheezing or shortness of breath.   azelastine  (ASTELIN ) 0.1 % nasal spray 1-2 sprays per nostril 2 times daily as needed.   benzonatate  (  TESSALON ) 200 MG capsule Take 1 capsule (200 mg total) by mouth 2 (two) times daily as needed for cough.   Budeson-Glycopyrrol-Formoterol  (BREZTRI  AEROSPHERE) 160-9-4.8 MCG/ACT AERO Inhale 2 puffs into the lungs in the morning and at bedtime.   doxycycline  (VIBRA -TABS) 100 MG tablet Take 1 tablet (100 mg total) by mouth 2 (two) times daily.   ezetimibe  (ZETIA ) 10 MG tablet Take 1 tablet (10 mg total) by mouth daily.   famotidine-calcium  carbonate-magnesium  hydroxide (PEPCID COMPLETE) 10-800-165 MG chewable tablet Chew by mouth.   fenofibrate  (TRICOR ) 145 MG tablet Take 1 tablet (145 mg total) by mouth daily.   flecainide  (TAMBOCOR ) 150 MG tablet Take 150 mg by mouth 2 (two) times daily.   gabapentin  (NEURONTIN ) 100 MG capsule TAKE 1 CAPSULE(100 MG) BY MOUTH AT BEDTIME   guaiFENesin  (MUCINEX ) 600 MG 12 hr tablet Take 2 tablets (1,200 mg total) by mouth 2 (two) times daily.   HYDROcodone  bit-homatropine (HYCODAN) 5-1.5 MG/5ML syrup Take 5 mLs by mouth every 6 (six) hours as needed for cough.   ibuprofen  (ADVIL ) 600 MG tablet Take 600 mg by mouth in the morning and at bedtime.   losartan  (COZAAR ) 25 MG tablet Take 1 tablet (25 mg total) by mouth daily.   Magnesium  250 MG TABS Take 1 tablet by mouth once a week.   metFORMIN  (GLUCOPHAGE ) 500 MG tablet TAKE 1 TABLET(500 MG) BY MOUTH TWICE DAILY WITH A MEAL   metoprolol  tartrate (LOPRESSOR ) 25 MG tablet Take one tablet by mouth twice a day.  May take one additional tablet by mouth as needed for breakthrough  palpitations.   pantoprazole  (PROTONIX ) 40 MG tablet Take 1 tablet (40 mg total) by mouth daily.   PRESCRIPTION MEDICATION Inhale into the lungs at bedtime. CPAP   rivaroxaban  (XARELTO ) 20 MG TABS tablet Take 1 tablet (20 mg total) by mouth daily with supper.   sildenafil  (VIAGRA ) 100 MG tablet Take 1 tablet (100 mg total) by mouth daily as needed for erectile dysfunction.   Spacer/Aero-Holding Chambers (AEROCHAMBER MV) inhaler Use as instructed   tirzepatide (ZEPBOUND) 2.5 MG/0.5ML Pen Inject 2.5 mg into the skin once a week.   Vitamin D , Ergocalciferol , (DRISDOL ) 1.25 MG (50000 UNIT) CAPS capsule Take 1 capsule (50,000 Units total) by mouth every 7 (seven) days.   [DISCONTINUED] zonisamide  (ZONEGRAN ) 25 MG capsule Take 2 capsules (50 mg total) by mouth daily.   zonisamide  (ZONEGRAN ) 25 MG capsule Take 2 capsules (50 mg total) by mouth daily.   No facility-administered encounter medications on file as of 01/12/2024.     Follow-Up   Return in about 4 weeks (around 02/09/2024) for For Weight Mangement with Dr. Allie Area.Aaron Aas He was informed of the importance of frequent follow up visits to maximize his success with intensive lifestyle modifications for his multiple health conditions.  Attestation Statement   Reviewed by clinician on day of visit: allergies, medications, problem list, medical history, surgical history, family history, social history, and previous encounter notes.   I have spent 58 minutes in the care of the patient today including: 6 minutes before the visit reviewing and preparing the chart. 38 minutes face-to-face assessing and reviewing listed medical problems as outlined in obesity care plan, providing nutritional and behavioral counseling on topics outlined in the obesity care plan, counseling regarding anti-obesity medication as outlined in obesity care plan, independently interpreting test results and goals of care, as described in assessment and plan, reviewing and  discussing biometric information and progress, reviewing latest PCP notes and specialist consultations,  ordering medications - see orders, and reviewing sleep study, serologies and CT imaging 14 minutes after the visit updating chart and documentation of encounter.    Ladd Picker, MD

## 2024-01-13 ENCOUNTER — Telehealth: Payer: Self-pay | Admitting: Medical

## 2024-01-13 DIAGNOSIS — K760 Fatty (change of) liver, not elsewhere classified: Secondary | ICD-10-CM | POA: Insufficient documentation

## 2024-01-13 NOTE — Assessment & Plan Note (Signed)
 Patient has lost 12 pounds since starting our program he is following a [place] reduced calorie nutrition plan.  He is also on metformin  and zonisamide  for pharmacotherapy but has abnormal orexigenic signaling characteristic of excessive adiposity and abnormal energy regulation system.  He benefits from pharmacotherapy in this case GLP-1 as he has multiple high risk obesity related comorbidities as outlined in note.  He will be prescribed Zepbound 2.5 mg once a week

## 2024-01-13 NOTE — Assessment & Plan Note (Signed)
 He has hepatic steatosis on CT scan completed in January of this year.  Fibrosis 4 Score = 1.3  He does not drink alcohol and is not on steatogenic medication.  Losing 15% of body weight may improve condition.  He is also working on reducing saturated fats simple and processed sugars in his diet and is engaging in physical activity.  GLP-1 may also help reduce the risk of fibrosis.

## 2024-01-13 NOTE — Telephone Encounter (Signed)
 Pt has an appt 5/30 and would like to do his labs 5/27 is possible. States these are his standard 38m labs.

## 2024-01-13 NOTE — Assessment & Plan Note (Signed)
 He has increased orexigenic signaling, impaired satiety and inhibitory control. This is secondary to an abnormal energy regulation system and pathological neurohormonal pathways characteristic of excess adiposity.  In addition to nutritional and behavioral strategies he benefits from pharmacotherapy.    He is currently on metformin  and has been on zonisamide  for 2 months but has not noticed an improvement in orixegenic signaling.  He would benefit from GLP-1 therapy.  He will be prescribed Zepbound 2.5 mg once a week

## 2024-01-13 NOTE — Assessment & Plan Note (Signed)
 His HOMA-IR is 5.49 which is elevated. Optimal level < 1.9.   This is complex condition associated with genetics, ectopic fat and lifestyle factors. Insulin  resistance may result in increased fat storage, inhibition of the breakdown of fat, cause fluctuations in blood sugar leading to energy crashes and increased cravings for sugary or high carb foods and cause metabolic slowdown making it difficult to lose weight.  This may result in additional weight gain and lead to pre-diabetes and diabetes if untreated. In addition, hyperinsulinemia increases cardiovascular risk, chronic inflammatory response and may increase the risk of obesity related malignancies.  Lab Results  Component Value Date   HGBA1C 6.1 09/12/2023   Lab Results  Component Value Date   INSULIN  25.3 (H) 11/03/2023   Lab Results  Component Value Date   GLUCOSE 89 11/10/2023   GLUCOSE 121 (H) 11/02/2011    We reviewed treatment options which include losing 7 to 10% of body weight, increasing volume of physical activity and maintaining a diet low in saturated fats and with a low glycemic load.  Patient has also been educated on the carb insulin  model of obesity.  Continue with metformin  for pharmacoprophylaxis.  Start Zepbound 2.5 mg once a week

## 2024-01-13 NOTE — Assessment & Plan Note (Signed)
 Reviewed sleep study from July 2022 he had an AHI of 50 with oxygen desaturations he is currently on PAP therapy but still has an elevated Epworth around 13 despite adjustments in pressures.  I feel that patient will benefit from weight loss aided by GLP-1 to reduce AHI and symptoms related to sleep disordered breathing.  It would also help with the management of other obesity related comorbidities as outlined in obesity care plan.

## 2024-01-13 NOTE — Assessment & Plan Note (Signed)
 Based on most recent hemoglobin A1c of 6.0.  He is currently on metformin  XR 500 mg twice a day and will continue medication.  He will continue to take B12 supplementation.  Patient was educated on the carb insulin  model of obesity today and the importance of maintaining a diet with a low glycemic load.  He has reduced simple carbs in his diet significantly and has been eating more fruits and vegetables.  I congratulated him on his efforts.

## 2024-01-13 NOTE — Assessment & Plan Note (Signed)
 Blood pressure at goal for age and risk category.  On losartan  and metoprolol  without adverse effects.  Metoprolol  may cause weight gain but needed for management of his tachyarrhythmia.  Continue with weight loss therapy. Losing 10% may improve blood pressure control. Monitor for symptoms of orthostasis while losing weight. Continue current regimen and home monitoring for a goal blood pressure of 120/80.  Losartan  and metoprolol 

## 2024-01-16 ENCOUNTER — Telehealth: Payer: Self-pay | Admitting: Medical

## 2024-01-16 DIAGNOSIS — R600 Localized edema: Secondary | ICD-10-CM

## 2024-01-16 DIAGNOSIS — I10 Essential (primary) hypertension: Secondary | ICD-10-CM

## 2024-01-16 DIAGNOSIS — E785 Hyperlipidemia, unspecified: Secondary | ICD-10-CM

## 2024-01-16 DIAGNOSIS — R7303 Prediabetes: Secondary | ICD-10-CM

## 2024-01-16 NOTE — Telephone Encounter (Signed)
Future labs placed. 

## 2024-01-17 ENCOUNTER — Other Ambulatory Visit: Payer: Self-pay | Admitting: Family Medicine

## 2024-01-17 ENCOUNTER — Other Ambulatory Visit (HOSPITAL_BASED_OUTPATIENT_CLINIC_OR_DEPARTMENT_OTHER): Payer: Self-pay

## 2024-01-17 NOTE — Telephone Encounter (Signed)
 Appt made

## 2024-01-17 NOTE — Telephone Encounter (Signed)
 VOB has been submitted for Monovisc, bilateral knee

## 2024-01-18 ENCOUNTER — Other Ambulatory Visit (HOSPITAL_BASED_OUTPATIENT_CLINIC_OR_DEPARTMENT_OTHER): Payer: Self-pay

## 2024-01-18 ENCOUNTER — Telehealth (INDEPENDENT_AMBULATORY_CARE_PROVIDER_SITE_OTHER): Payer: Self-pay

## 2024-01-18 ENCOUNTER — Other Ambulatory Visit (INDEPENDENT_AMBULATORY_CARE_PROVIDER_SITE_OTHER): Payer: Medicare (Managed Care)

## 2024-01-18 ENCOUNTER — Telehealth: Payer: Self-pay

## 2024-01-18 ENCOUNTER — Other Ambulatory Visit: Payer: Self-pay

## 2024-01-18 ENCOUNTER — Ambulatory Visit (INDEPENDENT_AMBULATORY_CARE_PROVIDER_SITE_OTHER): Payer: Medicare (Managed Care)

## 2024-01-18 VITALS — BP 103/66 | Ht 68.0 in | Wt 260.0 lb

## 2024-01-18 DIAGNOSIS — Z1211 Encounter for screening for malignant neoplasm of colon: Secondary | ICD-10-CM | POA: Diagnosis not present

## 2024-01-18 DIAGNOSIS — I10 Essential (primary) hypertension: Secondary | ICD-10-CM

## 2024-01-18 DIAGNOSIS — Z5320 Procedure and treatment not carried out because of patient's decision for unspecified reasons: Secondary | ICD-10-CM

## 2024-01-18 DIAGNOSIS — R600 Localized edema: Secondary | ICD-10-CM | POA: Diagnosis not present

## 2024-01-18 DIAGNOSIS — Z2821 Immunization not carried out because of patient refusal: Secondary | ICD-10-CM

## 2024-01-18 DIAGNOSIS — R7303 Prediabetes: Secondary | ICD-10-CM | POA: Diagnosis not present

## 2024-01-18 DIAGNOSIS — E785 Hyperlipidemia, unspecified: Secondary | ICD-10-CM | POA: Diagnosis not present

## 2024-01-18 DIAGNOSIS — Z0000 Encounter for general adult medical examination without abnormal findings: Secondary | ICD-10-CM

## 2024-01-18 DIAGNOSIS — M25561 Pain in right knee: Secondary | ICD-10-CM

## 2024-01-18 LAB — COMPREHENSIVE METABOLIC PANEL WITH GFR
ALT: 23 U/L (ref 0–53)
AST: 16 U/L (ref 0–37)
Albumin: 4.3 g/dL (ref 3.5–5.2)
Alkaline Phosphatase: 83 U/L (ref 39–117)
BUN: 23 mg/dL (ref 6–23)
CO2: 26 meq/L (ref 19–32)
Calcium: 9.7 mg/dL (ref 8.4–10.5)
Chloride: 101 meq/L (ref 96–112)
Creatinine, Ser: 1.12 mg/dL (ref 0.40–1.50)
GFR: 67.94 mL/min (ref >60.00)
Glucose, Bld: 111 mg/dL — ABNORMAL HIGH (ref 70–99)
Potassium: 4.6 meq/L (ref 3.5–5.1)
Sodium: 138 meq/L (ref 135–145)
Total Bilirubin: 0.4 mg/dL (ref 0.2–1.2)
Total Protein: 6.9 g/dL (ref 6.0–8.3)

## 2024-01-18 LAB — LIPID PANEL
Cholesterol: 209 mg/dL — ABNORMAL HIGH (ref 0–200)
HDL: 42.4 mg/dL (ref >39.00)
LDL Cholesterol: 124 mg/dL — ABNORMAL HIGH (ref 0–99)
NonHDL: 166.81
Total CHOL/HDL Ratio: 5
Triglycerides: 212 mg/dL — ABNORMAL HIGH (ref 0.0–149.0)
VLDL: 42.4 mg/dL — ABNORMAL HIGH (ref 0.0–40.0)

## 2024-01-18 LAB — HEMOGLOBIN A1C: Hgb A1c MFr Bld: 5.9 % (ref 4.6–6.5)

## 2024-01-18 LAB — BRAIN NATRIURETIC PEPTIDE: Pro B Natriuretic peptide (BNP): 20 pg/mL (ref 0.0–100.0)

## 2024-01-18 NOTE — Telephone Encounter (Signed)
 Talked with patient and advised him that per his insurance, gel injection is not covered under his medical plan.  Patient stated that he will talk with Dr. Rozelle Corning at his appt.

## 2024-01-18 NOTE — Telephone Encounter (Signed)
 Richard Walker (Key: [id number]) Zepbound 2.5MG /0.5ML pen-injectors Form [employer] Electronic Prior Authorization Request Form [phone] NCPDP)

## 2024-01-18 NOTE — Patient Instructions (Signed)
 [name] , Thank you for taking time out of your busy schedule to complete your Annual Wellness Visit with me. I enjoyed our conversation and look forward to speaking with you again next year. I, as well as your care team,  appreciate your ongoing commitment to your health goals. Please review the following plan we discussed and let me know if I can assist you in the future. Your Game plan/ To Do List    Referrals: If you haven't heard from the office you've been referred to, please reach out to them at the phone provided.   Follow up Visits: Next Medicare AWV with our clinical staff: 01/23/2024   Have you seen your provider in the last 6 months (3 months if uncontrolled diabetes)? Yes Next Office Visit with your provider: 01/20/2024  Clinician Recommendations:  Aim for 30 minutes of exercise or brisk walking, 6-8 glasses of water, and 5 servings of fruits and vegetables each day.       This is a list of the screening recommended for you and due dates:  Health Maintenance  Topic Date Due   Hepatitis C Screening  Never done   Colon Cancer Screening  12/05/2022   COVID-19 Vaccine (3 - 2024-25 season) 04/24/2023   Flu Shot  03/23/2024   Medicare Annual Wellness Visit  01/17/2025   DTaP/Tdap/Td vaccine (6 - Td or Tdap) 01/22/2031   Pneumonia Vaccine  Completed   Zoster (Shingles) Vaccine  Completed   HPV Vaccine  Aged Out   Meningitis B Vaccine  Aged Out    Advanced directives: (Declined) Advance directive discussed with you today. Even though you declined this today, please call our office should you change your mind, and we can give you the proper paperwork for you to fill out. Advance Care Planning is important because it:  [x]  Makes sure you receive the medical care that is consistent with your values, goals, and preferences  [x]  It provides guidance to your family and loved ones and reduces their decisional burden about whether or not they are making the right decisions based on  your wishes.  Follow the link provided in your after visit summary or read over the paperwork we have mailed to you to help you started getting your Advance Directives in place. If you need assistance in completing these, please reach out to us  so that we can help you!  See attachments for Preventive Care and Fall Prevention Tips.

## 2024-01-18 NOTE — Telephone Encounter (Signed)
 Pt Zepbound  was Approved. This drug has been approved under the Member's Medicare Part D benefit. Approved quantity: 2 units per 28 day(s). You may fill up to a 90 day supply except for those on Specialty Tier 5, which can be filled up to a 30 day supply. Please call the pharmacy to process the prescription claim.. Authorization Expiration Date: August 22, 2098.

## 2024-01-18 NOTE — Progress Notes (Signed)
 Because this visit was a virtual/telehealth visit,  certain criteria was not obtained, such a blood pressure, CBG if applicable, and timed get up and go. Any medications not marked as "taking" were not mentioned during the medication reconciliation part of the visit. Any vitals not documented were not able to be obtained due to this being a telehealth visit or patient was unable to self-report a recent blood pressure reading due to a lack of equipment at home via telehealth. Vitals that have been documented are verbally provided by the patient.   This visit was performed by a medical professional under my direct supervision. I was immediately available for consultation/collaboration. I have reviewed and agree with the Annual Wellness Visit documentation.  Subjective:   Richard Walker is a 68 y.o. who presents for a Medicare Wellness preventive visit.  As a reminder, Annual Wellness Visits don't include a physical exam, and some assessments may be limited, especially if this visit is performed virtually. We may recommend an in-person follow-up visit with your provider if needed.  Visit Complete: Virtual I connected with  LOGUN COLAVITO on 01/18/24 by a audio enabled telemedicine application and verified that I am speaking with the correct person using two identifiers.  Patient Location: Home  Provider Location: Home Office  I discussed the limitations of evaluation and management by telemedicine. The patient expressed understanding and agreed to proceed.  Vital Signs: Because this visit was a virtual/telehealth visit, some criteria may be missing or patient reported. Any vitals not documented were not able to be obtained and vitals that have been documented are patient reported.  VideoDeclined- This patient declined Librarian, academic. Therefore the visit was completed with audio only.  Persons Participating in Visit: Patient.  AWV Questionnaire: No: Patient Medicare  AWV questionnaire was not completed prior to this visit.  Cardiac Risk Factors include: advanced age (>89men, >62 women);male gender;hypertension;obesity (BMI >30kg/m2);Other (see comment), Risk factor comments: afib     Objective:     Today's Vitals   01/18/24 1519  BP: 103/66  Weight: 260 lb (117.9 kg)  Height: 5\' 8"  (1.727 m)   Body mass index is 39.53 kg/m.     01/18/2024    3:25 PM 12/29/2022    3:02 PM 12/23/2021   12:20 PM 01/21/2021    1:32 PM 12/04/2020    9:35 PM 11/05/2020    4:20 PM 10/07/2020   11:31 AM  Advanced Directives  Does Patient Have a Medical Advance Directive? No No No No No No No  Would patient like information on creating a medical advance directive? No - Patient declined No - Patient declined   No - Patient declined No - Patient declined No - Patient declined    Current Medications (verified) Outpatient Encounter Medications as of 01/18/2024  Medication Sig   acetaminophen  (TYLENOL ) 325 MG tablet Take 2 tablets (650 mg total) by mouth every 6 (six) hours.   albuterol  (PROAIR  HFA) 108 (90 Base) MCG/ACT inhaler Inhale 1-2 puffs into the lungs every 6 (six) hours as needed for wheezing or shortness of breath.   azelastine  (ASTELIN ) 0.1 % nasal spray 1-2 sprays per nostril 2 times daily as needed.   benzonatate  (TESSALON ) 200 MG capsule Take 1 capsule (200 mg total) by mouth 2 (two) times daily as needed for cough.   Budeson-Glycopyrrol-Formoterol  (BREZTRI  AEROSPHERE) 160-9-4.8 MCG/ACT AERO Inhale 2 puffs into the lungs in the morning and at bedtime.   doxycycline  (VIBRA -TABS) 100 MG tablet Take 1 tablet (  100 mg total) by mouth 2 (two) times daily.   ezetimibe  (ZETIA ) 10 MG tablet Take 1 tablet (10 mg total) by mouth daily.   famotidine-calcium  carbonate-magnesium  hydroxide (PEPCID COMPLETE) 10-800-165 MG chewable tablet Chew by mouth.   fenofibrate  (TRICOR ) 145 MG tablet Take 1 tablet (145 mg total) by mouth daily.   flecainide  (TAMBOCOR ) 150 MG tablet Take 150  mg by mouth 2 (two) times daily.   guaiFENesin  (MUCINEX ) 600 MG 12 hr tablet Take 2 tablets (1,200 mg total) by mouth 2 (two) times daily.   ibuprofen  (ADVIL ) 600 MG tablet Take 600 mg by mouth in the morning and at bedtime.   losartan  (COZAAR ) 25 MG tablet Take 1 tablet (25 mg total) by mouth daily.   Magnesium  250 MG TABS Take 1 tablet by mouth once a week.   metFORMIN  (GLUCOPHAGE ) 500 MG tablet TAKE 1 TABLET(500 MG) BY MOUTH TWICE DAILY WITH A MEAL   metoprolol  tartrate (LOPRESSOR ) 25 MG tablet Take one tablet by mouth twice a day.  May take one additional tablet by mouth as needed for breakthrough palpitations.   pantoprazole  (PROTONIX ) 40 MG tablet Take 1 tablet (40 mg total) by mouth daily.   PRESCRIPTION MEDICATION Inhale into the lungs at bedtime. CPAP   rivaroxaban  (XARELTO ) 20 MG TABS tablet Take 1 tablet (20 mg total) by mouth daily with supper.   sildenafil  (VIAGRA ) 100 MG tablet Take 1 tablet (100 mg total) by mouth daily as needed for erectile dysfunction.   Spacer/Aero-Holding Chambers (AEROCHAMBER MV) inhaler Use as instructed   tirzepatide (ZEPBOUND) 2.5 MG/0.5ML Pen Inject 2.5 mg into the skin once a week.   Vitamin D , Ergocalciferol , (DRISDOL ) 1.25 MG (50000 UNIT) CAPS capsule Take 1 capsule (50,000 Units total) by mouth every 7 (seven) days.   zonisamide  (ZONEGRAN ) 25 MG capsule Take 2 capsules (50 mg total) by mouth daily.   gabapentin  (NEURONTIN ) 100 MG capsule TAKE 1 CAPSULE(100 MG) BY MOUTH AT BEDTIME   HYDROcodone  bit-homatropine (HYCODAN) 5-1.5 MG/5ML syrup Take 5 mLs by mouth every 6 (six) hours as needed for cough.   No facility-administered encounter medications on file as of 01/18/2024.    Allergies (verified) Dust mite extract, Pork-derived products, Statins, and Tree extract   History: Past Medical History:  Diagnosis Date   DDD (degenerative disc disease), lumbar    L3/L4   Diverticulosis    GERD (gastroesophageal reflux disease)    Hyperlipidemia     Low sperm motility    Pre-diabetes    Sleep apnea    uses cpap    SVT (supraventricular tachycardia) [facility])    Past Surgical History:  Procedure Laterality Date   AMPUTATION Left 07/15/2018   Procedure: . REPAIR OF LEFT INDEX AND LONG FINGER BY FUSION /  REPAIR OF RING FINGER;  Surgeon: Rober Chimera, MD;  Location: [facility] OR;  Service: Orthopedics;  Laterality: Left;   BACK SURGERY     BLALOCK PROCEDURE     Ablation   CARDIAC CATHETERIZATION N/A 12/22/2015   Procedure: Left Heart Cath and Coronary Angiography;  Surgeon: Peter M Swaziland, MD;  Location: [facility] INVASIVE CV LAB;  Service: Cardiovascular;  Laterality: N/A;   CARDIAC ELECTROPHYSIOLOGY STUDY AND ABLATION     ELECTROPHYSIOLOGIC STUDY N/A 02/19/2016   Procedure: SVT Ablation;  Surgeon: Tammie Fall, MD;  Location: [facility] INVASIVE CV LAB;  Service: Cardiovascular;  Laterality: N/A;   HAND SURGERY     septal reconstruction  2017   TURBINATE REDUCTION Bilateral 07/2016   WISDOM  TOOTH EXTRACTION     Family History  Problem Relation Age of Onset   Stroke Mother    Heart disease Mother    High Cholesterol Mother    Sleep apnea Father    Hypertension Father    Food Allergy Son    Colon cancer Neg Hx    Stomach cancer Neg Hx    Allergic rhinitis Neg Hx    Angioedema Neg Hx    Asthma Neg Hx    Eczema Neg Hx    Immunodeficiency Neg Hx    Urticaria Neg Hx    Social History   Socioeconomic History   Marital status: Married    Spouse name: Not on file   Number of children: 4   Years of education: Not on file   Highest education level: Bachelor's degree (e.g., BA, AB, BS)  Occupational History   Occupation: retired  Tobacco Use   Smoking status: Former    Current packs/day: 0.00    Average packs/day: 1 pack/day for 10.0 years (10.0 ttl pk-yrs)    Types: Cigarettes    Start date: 53    Quit date: 1992    Years since quitting: 33.4   Smokeless tobacco: Never   Tobacco comments:    Uses Hookah Pipe occasionally  Vaping  Use   Vaping status: Never Used  Substance and Sexual Activity   Alcohol use: No   Drug use: No   Sexual activity: Yes  Other Topics Concern   Not on file  Social History Narrative   Marital Status: Married (Feryal)Children:  4 (3 Sons/1 Daughter)Pets:  None Living Situation: Lives with spouse and 4 childrenOrigin:  He was born in [place].Occupation: Production designer, 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  Alcohol Use:  NoneDrug Use:  NoneDiet:  RegularExercise:  NoneHobbies:  Cards   Retired    Teacher, early years/pre Strain: High Risk (01/17/2024)   Overall Financial Resource Strain (CARDIA)    Difficulty of Paying Living Expenses: Very hard  Food Insecurity: Food Insecurity Present (01/17/2024)   Hunger Vital Sign    Worried About Running Out of Food in the Last Year: Often true    Ran Out of Food in the Last Year: Sometimes true  Transportation Needs: No Transportation Needs (01/17/2024)   PRAPARE - Administrator, Civil Service (Medical): No    Lack of Transportation (Non-Medical): No  Physical Activity: Inactive (01/17/2024)   Exercise Vital Sign    Days of Exercise per Week: 0 days    Minutes of Exercise per Session: 20 min  Stress: No Stress Concern Present (01/17/2024)   [employer] of Occupational Health - Occupational Stress Questionnaire    Feeling of Stress : Only a little  Social Connections: Socially Integrated (01/17/2024)   Social Connection and Isolation Panel [NHANES]    Frequency of Communication with Friends and Family: More than three times a week    Frequency of Social Gatherings with Friends and Family: More than three times a week    Attends Religious Services: More than 4 times per year    Active Member of [employer] or Organizations: Yes    Attends Engineer, structural: More than 4 times per year    Marital Status:  Married    Tobacco Counseling Counseling given: Not Answered Tobacco comments: Uses Hookah Pipe occasionally    Clinical Intake:  Pre-visit preparation completed: Yes  Pain : No/denies pain  BMI - recorded: 39.53 Nutritional Status: BMI > 30  Obese Nutritional Risks: None Diabetes: No  Lab Results  Component Value Date   HGBA1C 5.9 01/18/2024   HGBA1C 6.1 09/12/2023   HGBA1C 5.9 03/10/2023     How often do you need to have someone help you when you read instructions, pamphlets, or other written materials from your doctor or pharmacy?: 1 - Never  Interpreter Needed?: No  Information entered by :: Jomarion Mish,cma   Activities of Daily Living     01/18/2024    3:24 PM  In your present state of health, do you have any difficulty performing the following activities:  Hearing? 0  Vision? 0  Difficulty concentrating or making decisions? 0  Walking or climbing stairs? 1  Dressing or bathing? 0  Doing errands, shopping? 0  Preparing Food and eating ? N  Using the Toilet? N  In the past six months, have you accidently leaked urine? N  Do you have problems with loss of bowel control? N  Managing your Medications? N  Managing your Finances? N  Housekeeping or managing your Housekeeping? N    Patient Care Team: Saguier, Edward, PA-C as PCP - General (Internal Medicine) Tammie Fall, MD as PCP - Electrophysiology (Cardiology) Deena Farrier, MD as Consulting Physician (Otolaryngology)  Indicate any recent Medical Services you may have received from other than [facility] providers in the past year (date may be approximate).     Assessment:    This is a routine wellness examination for Martie.  Hearing/Vision screen Hearing Screening - Comments:: No difficulties hearing Vision Screening - Comments:: No difficulties   Goals Addressed   None    Depression Screen     01/18/2024    3:26 PM 12/29/2022    3:07 PM 02/02/2022    1:47 PM 12/23/2021   12:21 PM  04/15/2021    3:01 PM 12/24/2015   10:28 AM  PHQ 2/9 Scores  PHQ - 2 Score 3 0 0 1 0 0  PHQ- 9 Score 4       Exception Documentation      Other- indicate reason in comment box  Not completed      Patient states life causes his depression at times    Fall Risk     01/18/2024    3:25 PM 12/29/2022    3:04 PM 02/02/2022    1:47 PM 12/23/2021   12:21 PM 12/24/2015   10:28 AM  Fall Risk   Falls in the past year? 0 0 0 0 Yes  Number falls in past yr: 0 0 0 0 1  Injury with Fall? 0 0 0 0 No  Risk for fall due to : No Fall Risks No Fall Risks  Medication side effect   Follow up Falls evaluation completed Falls evaluation completed  Falls evaluation completed;Education provided;Falls prevention discussed Falls prevention discussed    MEDICARE RISK AT HOME:  Medicare Risk at Home Any stairs in or around the home?: Yes If so, are there any without handrails?: No Home free of loose throw rugs in walkways, pet beds, electrical cords, etc?: Yes Adequate lighting in your home to reduce risk of falls?: Yes Life alert?: No Use of a cane, walker or w/c?: No Grab bars in the bathroom?: No Shower chair or bench in shower?: No Elevated toilet seat or a handicapped toilet?: No  TIMED UP AND GO:  Was the test performed?  No  Cognitive Function: 6CIT completed  01/18/2024    3:22 PM 12/29/2022    3:12 PM 12/23/2021   12:25 PM  6CIT Screen  What Year? 0 points 0 points 0 points  What month? 0 points 0 points 0 points  What time? 0 points 0 points 0 points  Count back from 20 0 points 0 points 0 points  Months in reverse 0 points 0 points 0 points  Repeat phrase 0 points 2 points 0 points  Total Score 0 points 2 points 0 points    Immunizations Immunization History  Administered Date(s) Administered   Fluad Quad(high Dose 65+) 07/29/2021, 09/13/2022   Influenza Split 05/23/2017   Influenza,inj,Quad PF,6+ Mos 09/18/2018, 08/14/2020   Influenza,inj,Quad PF,6-35 Mos 09/18/2018, 08/14/2020    Influenza,inj,quad, With Preservative 05/23/2017   Influenza-Unspecified 05/23/2017, 09/19/2018   PFIZER(Purple Top)SARS-COV-2 Vaccination 10/02/2020, 11/04/2020   PNEUMOCOCCAL CONJUGATE-20 09/13/2022   PPD Test 02/26/2015   Pneumococcal Polysaccharide-23 10/24/2017   Td 09/15/2001   Td (Adult),5 Lf Tetanus Toxid, Preservative Free 09/15/2001   Tdap 06/16/2007, 10/24/2017, 01/21/2021   Zoster Recombinant(Shingrix) 10/15/2020, 03/02/2021   Zoster, Live 04/19/2012    Screening Tests Health Maintenance  Topic Date Due   Hepatitis C Screening  Never done   Colonoscopy  12/05/2022   COVID-19 Vaccine (3 - 2024-25 season) 04/24/2023   INFLUENZA VACCINE  03/23/2024   Medicare Annual Wellness (AWV)  01/17/2025   DTaP/Tdap/Td (6 - Td or Tdap) 01/22/2031   Pneumonia Vaccine 70+ Years old  Completed   Zoster Vaccines- Shingrix  Completed   HPV VACCINES  Aged Out   Meningococcal B Vaccine  Aged Out    Health Maintenance  Health Maintenance Due  Topic Date Due   Hepatitis C Screening  Never done   Colonoscopy  12/05/2022   COVID-19 Vaccine (3 - 2024-25 season) 04/24/2023   Health Maintenance Items Addressed: Referral for colonoscopy sent    Additional Screening:  Vision Screening: Recommended annual ophthalmology exams for early detection of glaucoma and other disorders of the eye.  Dental Screening: Recommended annual dental exams for proper oral hygiene  Community Resource Referral / Chronic Care Management: CRR required this visit?  No   CCM required this visit?  No   Plan:    I have personally reviewed and noted the following in the patient's chart:   Medical and social history Use of alcohol, tobacco or illicit drugs  Current medications and supplements including opioid prescriptions. Patient is not currently taking opioid prescriptions. Functional ability and status Nutritional status Physical activity Advanced directives List of other  physicians Hospitalizations, surgeries, and ER visits in previous 12 months Vitals Screenings to include cognitive, depression, and falls Referrals and appointments  In addition, I have reviewed and discussed with patient certain preventive protocols, quality metrics, and best practice recommendations. A written personalized care plan for preventive services as well as general preventive health recommendations were provided to patient.   Freeda Jerry, [place]   01/18/2024   After Visit Summary: (MyChart) Due to this being a telephonic visit, the after visit summary with patients personalized plan was offered to patient via MyChart   Notes: Nothing significant to report at this time.

## 2024-01-19 ENCOUNTER — Ambulatory Visit: Payer: Self-pay | Admitting: Medical

## 2024-01-19 ENCOUNTER — Other Ambulatory Visit (HOSPITAL_BASED_OUTPATIENT_CLINIC_OR_DEPARTMENT_OTHER): Payer: Self-pay

## 2024-01-19 ENCOUNTER — Encounter: Payer: Self-pay | Admitting: Orthopedic Surgery

## 2024-01-19 ENCOUNTER — Telehealth: Payer: Self-pay

## 2024-01-19 ENCOUNTER — Ambulatory Visit: Payer: Medicare (Managed Care) | Admitting: Orthopedic Surgery

## 2024-01-19 VITALS — Ht 67.75 in | Wt 259.6 lb

## 2024-01-19 DIAGNOSIS — M25562 Pain in left knee: Secondary | ICD-10-CM

## 2024-01-19 DIAGNOSIS — G8929 Other chronic pain: Secondary | ICD-10-CM | POA: Diagnosis not present

## 2024-01-19 DIAGNOSIS — M25561 Pain in right knee: Secondary | ICD-10-CM | POA: Diagnosis not present

## 2024-01-19 NOTE — Progress Notes (Addendum)
 Office Visit Note   Patient: Richard Walker           Date of Birth: July 03, 1956           MRN: [id number] Visit Date: 01/19/2024 Requested by: Dorina Loving, PA-C [id number] [place],  [place] [address] PCP: Dorina Loving RIGGERS  Subjective: Chief Complaint  Patient presents with   Right Knee - Follow-up   Left Knee - Follow-up    HPI: Richard Walker is a 67 y.o. male who presents to the office reporting bilateral knee pain left worse than right.  Patient does not qualify for gel injection.  His BMI is 39.76.  Has a family history of osteoporosis.  He describes night pain rest pain as well as pain which interferes with his activities of daily living.  Reports significantly decreased standing and walking endurance..                ROS: All systems reviewed are negative as they relate to the chief complaint within the history of present illness.  Patient denies fevers or chills.  Assessment & Plan: Visit Diagnoses:  1. Bilateral chronic knee pain     Plan: Impression is severe end-stage bilateral knee arthritis left worse than right.  Patient just does come under BMIRequirements for elective knee replacement.  The risk and benefits of left knee replacement are discussed again with the patient.  They include but are not limited to infection nerve or vessel damage incomplete pain relief as well as incomplete functional restoration.  The expected rehabilitation course is also described.  All questions answered.  No personal or family history of DVT or pulmonary embolism.  Issues with kneeling and a total knee replacement also discussed.  Kneeling not really advised on half-thickness patella in terms of weightbearing while kneeling. CPT code 72552 - left total knee replacement.  Follow-Up Instructions: No follow-ups on file.   Orders:  Orders Placed This Encounter  Procedures   DG BONE DENSITY (DXA)   Ambulatory referral to Physical Therapy   No orders of the defined  types were placed in this encounter.     Procedures: No procedures performed   Clinical Data: No additional findings.  Objective: Vital Signs: Ht 5' 7.75 (1.721 m)   Wt 259 lb 9.6 oz (117.8 kg)   BMI 39.76 kg/m   Physical Exam:  Constitutional: Patient appears well-developed HEENT:  Head: Normocephalic Eyes:EOM are normal Neck: Normal range of motion Cardiovascular: Normal rate Pulmonary/chest: Effort normal Neurologic: Patient is alert Skin: Skin is warm Psychiatric: Patient has normal mood and affect  Ortho Exam: Ortho exam demonstrates mild varus alignment bilateral knees.  Trace effusion present.  Pedal pulses palpable.  Ankle dorsiflexion intact.  No groin pain with internal or external rotation of the leg.  No masses lymphadenopathy or skin changes noted in that left knee region.  Range of motion is about 5-1 05.  That is in both knees.  End-stage arthritis is present in both knees radiographically as well.  Specialty Comments:  No specialty comments available.  Imaging: No results found.   PMFS History: Patient Active Problem List   Diagnosis Date Noted   Metabolic dysfunction-associated steatotic liver disease (MASLD) 01/13/2024   Abnormal food appetite 11/17/2023   Insulin  resistance 11/17/2023   Class 2 severe obesity due to excess calories with serious comorbidity and body mass index (BMI) of 35.0 to 35.9 in adult (HCC) 11/03/2023   Elevated blood sugar 11/03/2023  Prediabetes 11/03/2023   Degenerative disc disease, lumbar 12/09/2022   Morbid obesity (HCC) 10/26/2022   Bronchitis 01/29/2022   OSA on CPAP 02/19/2021   Bite, snake 01/28/2021   Neck pain 11/13/2020   Vertigo 11/13/2020   Hypertension 11/03/2020   Contusion of bone 10/23/2020   Degenerative arthritis of knee, bilateral 10/23/2020   Concussion with no loss of consciousness 10/23/2020   Paroxysmal atrial fibrillation (HCC) 05/22/2019   Unspecified atrial flutter (HCC) 05/22/2019    Pain in right foot 03/30/2018   Perennial and seasonal allergic rhinitis 11/09/2017   History of food allergy 11/09/2017   Wheezing 11/09/2017   Dyspnea/wheezing 10/31/2017   Cough 10/31/2017   Bronchitis, mucopurulent recurrent (HCC) 09/07/2017   SVT (supraventricular tachycardia) (HCC) 04/23/2015   Abdominal pain, epigastric 06/03/2013   Vitamin D  deficiency 04/09/2013   Esophageal reflux 04/09/2013   Hepatitis B carrier (HCC) 04/09/2013   Other and unspecified hyperlipidemia 04/09/2013   Sleep apnea 04/09/2013   Past Medical History:  Diagnosis Date   DDD (degenerative disc disease), lumbar    L3/L4   Diverticulosis    GERD (gastroesophageal reflux disease)    Hyperlipidemia    Low sperm motility    Pre-diabetes    Sleep apnea    uses cpap    SVT (supraventricular tachycardia) (HCC)     Family History  Problem Relation Age of Onset   Stroke Mother    Heart disease Mother    High Cholesterol Mother    Sleep apnea Father    Hypertension Father    Food Allergy Son    Colon cancer Neg Hx    Stomach cancer Neg Hx    Allergic rhinitis Neg Hx    Angioedema Neg Hx    Asthma Neg Hx    Eczema Neg Hx    Immunodeficiency Neg Hx    Urticaria Neg Hx     Past Surgical History:  Procedure Laterality Date   AMPUTATION Left 07/15/2018   Procedure: . REPAIR OF LEFT INDEX AND LONG FINGER BY FUSION /  REPAIR OF RING FINGER;  Surgeon: Sebastian Lenis, MD;  Location: [facility] OR;  Service: Orthopedics;  Laterality: Left;   BACK SURGERY     BLALOCK PROCEDURE     Ablation   CARDIAC CATHETERIZATION N/A 12/22/2015   Procedure: Left Heart Cath and Coronary Angiography;  Surgeon: Peter M Swaziland, MD;  Location: [facility] INVASIVE CV LAB;  Service: Cardiovascular;  Laterality: N/A;   CARDIAC ELECTROPHYSIOLOGY STUDY AND ABLATION     ELECTROPHYSIOLOGIC STUDY N/A 02/19/2016   Procedure: SVT Ablation;  Surgeon: Danelle LELON Birmingham, MD;  Location: [facility] INVASIVE CV LAB;  Service: Cardiovascular;  Laterality: N/A;    HAND SURGERY     septal reconstruction  2017   TURBINATE REDUCTION Bilateral 07/2016   WISDOM TOOTH EXTRACTION     Social History   Occupational History   Occupation: retired  Tobacco Use   Smoking status: Former    Current packs/day: 0.00    Average packs/day: 1 pack/day for 10.0 years (10.0 ttl pk-yrs)    Types: Cigarettes    Start date: 75    Quit date: 1992    Years since quitting: 33.4   Smokeless tobacco: Never   Tobacco comments:    Uses Hookah Pipe occasionally  Vaping Use   Vaping status: Never Used  Substance and Sexual Activity   Alcohol use: No   Drug use: No   Sexual activity: Yes

## 2024-01-19 NOTE — Telephone Encounter (Signed)
 Clearance form faxed to Dr.Saguier and Dr.Taylor for upcoming surgery with Dr.Dean.

## 2024-01-20 ENCOUNTER — Telehealth: Payer: Self-pay

## 2024-01-20 ENCOUNTER — Ambulatory Visit (INDEPENDENT_AMBULATORY_CARE_PROVIDER_SITE_OTHER): Payer: Medicare (Managed Care) | Admitting: Medical

## 2024-01-20 VITALS — BP 124/70 | HR 66 | Resp 18 | Ht 67.5 in | Wt 262.0 lb

## 2024-01-20 DIAGNOSIS — E559 Vitamin D deficiency, unspecified: Secondary | ICD-10-CM | POA: Diagnosis not present

## 2024-01-20 DIAGNOSIS — E538 Deficiency of other specified B group vitamins: Secondary | ICD-10-CM | POA: Diagnosis not present

## 2024-01-20 DIAGNOSIS — R7303 Prediabetes: Secondary | ICD-10-CM

## 2024-01-20 DIAGNOSIS — R5383 Other fatigue: Secondary | ICD-10-CM

## 2024-01-20 DIAGNOSIS — G4733 Obstructive sleep apnea (adult) (pediatric): Secondary | ICD-10-CM

## 2024-01-20 DIAGNOSIS — Z1211 Encounter for screening for malignant neoplasm of colon: Secondary | ICD-10-CM

## 2024-01-20 DIAGNOSIS — I10 Essential (primary) hypertension: Secondary | ICD-10-CM

## 2024-01-20 LAB — T4, FREE: Free T4: 0.97 ng/dL (ref 0.60–1.60)

## 2024-01-20 LAB — CBC WITH DIFFERENTIAL/PLATELET
Basophils Absolute: 0 10*3/uL (ref 0.0–0.1)
Basophils Relative: 0.5 % (ref 0.0–3.0)
Eosinophils Absolute: 0.1 10*3/uL (ref 0.0–0.7)
Eosinophils Relative: 2 % (ref 0.0–5.0)
HCT: 46.5 % (ref 39.0–52.0)
Hemoglobin: 15.5 g/dL (ref 13.0–17.0)
Lymphocytes Relative: 26.1 % (ref 12.0–46.0)
Lymphs Abs: 1.7 10*3/uL (ref 0.7–4.0)
MCHC: 33.4 g/dL (ref 30.0–36.0)
MCV: 84.7 fl (ref 78.0–100.0)
Monocytes Absolute: 0.5 10*3/uL (ref 0.1–1.0)
Monocytes Relative: 7.1 % (ref 3.0–12.0)
Neutro Abs: 4.1 10*3/uL (ref 1.4–7.7)
Neutrophils Relative %: 64.3 % (ref 43.0–77.0)
Platelets: 195 10*3/uL (ref 150.0–400.0)
RBC: 5.49 Mil/uL (ref 4.22–5.81)
RDW: 14 % (ref 11.5–15.5)
WBC: 6.4 10*3/uL (ref 4.0–10.5)

## 2024-01-20 LAB — VITAMIN B12: Vitamin B-12: 951 pg/mL — ABNORMAL HIGH (ref 211–911)

## 2024-01-20 LAB — TSH: TSH: 1.81 u[IU]/mL (ref 0.35–5.50)

## 2024-01-20 LAB — VITAMIN D 25 HYDROXY (VIT D DEFICIENCY, FRACTURES): VITD: 55.49 ng/mL (ref 30.00–100.00)

## 2024-01-20 NOTE — Patient Instructions (Addendum)
 Obesity Obesity with BMI near 40 affects surgical eligibility. Weight loss essential to maintain BMI below 40 for knee surgery insurance coverage. Zepbound prescribed to aid weight loss, with potential side effects including nausea, constipation, and rare pancreatitis risk. Weight loss may improve sleep apnea, hypertension, and joint pain. - Start Zepbound for weight loss. - Monitor for side effects such as nausea and constipation. - Report any symptoms of pancreatitis. - Maintain BMI below 40 for surgical eligibility.  Severe Sleep Apnea Severe sleep apnea previously diagnosed. Weight loss may improve symptoms. Zepbound prescribed for weight loss, which may benefit sleep apnea.  Hypertension Hypertension well-controlled. Advised to take losartan  daily and monitor blood pressure. Weight loss may reduce blood pressure, potentially allowing medication adjustment. - Take losartan  daily. - Monitor blood pressure regularly. - Report if blood pressure consistently below 110.  Hyperlipidemia Mild to moderate hyperlipidemia managed with Zetia  and fenofibrate  due to previous statin reactions. Weight loss may improve lipid profile.  Fatty Liver Fatty liver noted on previous imaging. Weight loss may improve liver condition.  Vitamin D  deficiency Vitamin D  deficiency. Vitamin D  levels not checked in recent labs. - Order vitamin D  level.  Back Pain Intermittent back pain medial scapula rt side area , possibly muscular, responds to topical treatment. Differential includes pleuritic pain, but no current pleurisy or gallbladder issues. No rash and no pain today.   Follow-up Follow-up needed for lab results and potential pre-operative clearance for knee surgery. Discussed bone density testing due to family history of soft bone disease. - Order B12 level. Cbc. - Order TSH and T4 levels. - Schedule follow-up appointment based on lab results or in late July for pre-operative clearance. - Consider bone  density testing due to family history.

## 2024-01-20 NOTE — Progress Notes (Signed)
 Subjective:    Patient ID: Richard Walker, male    DOB: Jul 01, 1956, 68 y.o.   MRN: [id number]  HPI  Discussed the use of AI scribe software for clinical note transcription with the patient, who gave verbal consent to proceed.  History of Present Illness   Richard Walker is a 68 year old male with severe sleep apnea who presents for weight management and pre-surgical evaluation.  He is experiencing ongoing issues with weight management, which is critical for his upcoming knee surgery scheduled around August. His body mass index is currently at 39, close to the threshold that could affect insurance coverage for the surgery. He has been prescribed Zepbound to aid in weight loss and plans to start the medication tomorrow.  He has a history of severe sleep apnea, diagnosed in 2022, which has been a factor in the approval of his weight loss medication by Medicare. He experiences fatigue and low energy levels, describing a feeling of wanting to fall asleep and 'lack of energy is killing me'. He has noted tired eyes and difficulty staying awake.  He has a history of elevated cholesterol and is currently on Zetia  and fenofibrate  due to reactions to statins. His lipid panel remains mildly to moderately high. Weight loss is anticipated to improve his lipid levels.  He reports intermittent back pain, which he describes as occurring about a month ago, lasting for a minute to a minute and a half, and sometimes triggered by movement or deep breathing. He found relief using Vicks vapor rub. No current back pain.  He has a history of low vitamin D  and B12 levels, which have been problematic in the past. He notes that his energy levels are affected, possibly related to these deficiencies.  He manages his blood pressure with losartan , which he takes as needed rather than daily. He monitors his blood pressure and takes medication when he notices it is high.        Review of Systems  Constitutional:  Negative  for chills, fatigue and fever.  HENT:  Negative for congestion and ear pain.   Respiratory:  Negative for cough, chest tightness, shortness of breath and wheezing.   Cardiovascular:  Negative for chest pain and palpitations.  Gastrointestinal:  Negative for abdominal distention, anal bleeding, constipation and diarrhea.  Genitourinary:  Negative for dysuria and hematuria.  Musculoskeletal:  Positive for back pain.       None presently. Lumbar pain resolved. Intermittent rt side upper back pain pain medial to scapuls but no plueuritic pain and no rash. That pain resolved with vics vapor rub.  Neurological:  Negative for dizziness, syncope, weakness and numbness.  Hematological:  Negative for adenopathy. Does not bruise/bleed easily.  Psychiatric/Behavioral:  Negative for behavioral problems and confusion.     Past Medical History:  Diagnosis Date   DDD (degenerative disc disease), lumbar    L3/L4   Diverticulosis    GERD (gastroesophageal reflux disease)    Hyperlipidemia    Low sperm motility    Pre-diabetes    Sleep apnea    uses cpap    SVT (supraventricular tachycardia) (HCC)      Social History   Socioeconomic History   Marital status: Married    Spouse name: Not on file   Number of children: 4   Years of education: Not on file   Highest education level: Bachelor's degree (e.g., BA, AB, BS)  Occupational History   Occupation: retired  Tobacco Use   Smoking status:  Former    Current packs/day: 0.00    Average packs/day: 1 pack/day for 10.0 years (10.0 ttl pk-yrs)    Types: Cigarettes    Start date: 65    Quit date: 6    Years since quitting: 33.4   Smokeless tobacco: Never   Tobacco comments:    Uses Hookah Pipe occasionally  Vaping Use   Vaping status: Never Used  Substance and Sexual Activity   Alcohol use: No   Drug use: No   Sexual activity: Yes  Other Topics Concern   Not on file  Social History Narrative   Marital Status: Married  (Feryal)Children:  4 (3 Sons/1 Daughter)Pets:  None Living Situation: Lives with spouse and 4 childrenOrigin:  He was born in [place].Occupation: Production designer, 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  Alcohol Use:  NoneDrug Use:  NoneDiet:  RegularExercise:  NoneHobbies:  Cards   Retired    Teacher, early years/pre Strain: High Risk (01/17/2024)   Overall Financial Resource Strain (CARDIA)    Difficulty of Paying Living Expenses: Very hard  Food Insecurity: Food Insecurity Present (01/17/2024)   Hunger Vital Sign    Worried About Running Out of Food in the Last Year: Often true    Ran Out of Food in the Last Year: Sometimes true  Transportation Needs: No Transportation Needs (01/17/2024)   PRAPARE - Administrator, Civil Service (Medical): No    Lack of Transportation (Non-Medical): No  Physical Activity: Inactive (01/17/2024)   Exercise Vital Sign    Days of Exercise per Week: 0 days    Minutes of Exercise per Session: 20 min  Stress: No Stress Concern Present (01/17/2024)   [employer] of Occupational Health - Occupational Stress Questionnaire    Feeling of Stress : Only a little  Social Connections: Socially Integrated (01/17/2024)   Social Connection and Isolation Panel [NHANES]    Frequency of Communication with Friends and Family: More than three times a week    Frequency of Social Gatherings with Friends and Family: More than three times a week    Attends Religious Services: More than 4 times per year    Active Member of [employer] or Organizations: Yes    Attends Banker Meetings: More than 4 times per year    Marital Status: Married  Catering manager Violence: Not At Risk (01/18/2024)   Humiliation, Afraid, Rape, and Kick questionnaire    Fear of Current or Ex-Partner: No    Emotionally Abused: No    Physically Abused: No    Sexually  Abused: No    Past Surgical History:  Procedure Laterality Date   AMPUTATION Left 07/15/2018   Procedure: . REPAIR OF LEFT INDEX AND LONG FINGER BY FUSION /  REPAIR OF RING FINGER;  Surgeon: Rober Chimera, MD;  Location: [facility] OR;  Service: Orthopedics;  Laterality: Left;   BACK SURGERY     BLALOCK PROCEDURE     Ablation   CARDIAC CATHETERIZATION N/A 12/22/2015   Procedure: Left Heart Cath and Coronary Angiography;  Surgeon: Peter M Swaziland, MD;  Location: [facility] INVASIVE CV LAB;  Service: Cardiovascular;  Laterality: N/A;   CARDIAC ELECTROPHYSIOLOGY STUDY AND ABLATION     ELECTROPHYSIOLOGIC STUDY N/A 02/19/2016   Procedure: SVT Ablation;  Surgeon: Tammie Fall, MD;  Location: [facility] INVASIVE CV LAB;  Service: Cardiovascular;  Laterality: N/A;   HAND SURGERY     septal reconstruction  2017   TURBINATE REDUCTION Bilateral 07/2016   WISDOM TOOTH EXTRACTION      Family History  Problem Relation Age of Onset   Stroke Mother    Heart disease Mother    High Cholesterol Mother    Sleep apnea Father    Hypertension Father    Food Allergy Son    Colon cancer Neg Hx    Stomach cancer Neg Hx    Allergic rhinitis Neg Hx    Angioedema Neg Hx    Asthma Neg Hx    Eczema Neg Hx    Immunodeficiency Neg Hx    Urticaria Neg Hx     Allergies  Allergen Reactions   Dust Mite Extract    Pork-Derived Products Swelling   Statins     Muscle aches   Tree Extract Other (See Comments)    Intolerance     Current Outpatient Medications on File Prior to Visit  Medication Sig Dispense Refill   acetaminophen  (TYLENOL ) 325 MG tablet Take 2 tablets (650 mg total) by mouth every 6 (six) hours.     albuterol  (PROAIR  HFA) 108 (90 Base) MCG/ACT inhaler Inhale 1-2 puffs into the lungs every 6 (six) hours as needed for wheezing or shortness of breath. 24 g 3   azelastine  (ASTELIN ) 0.1 % nasal spray 1-2 sprays per nostril 2 times daily as needed. 90 mL 3   benzonatate  (TESSALON ) 200 MG capsule Take 1 capsule  (200 mg total) by mouth 2 (two) times daily as needed for cough. 20 capsule 0   Budeson-Glycopyrrol-Formoterol  (BREZTRI  AEROSPHERE) 160-9-4.8 MCG/ACT AERO Inhale 2 puffs into the lungs in the morning and at bedtime. 10.7 g 11   doxycycline  (VIBRA -TABS) 100 MG tablet Take 1 tablet (100 mg total) by mouth 2 (two) times daily. 20 tablet 0   ezetimibe  (ZETIA ) 10 MG tablet Take 1 tablet (10 mg total) by mouth daily. 90 tablet 3   famotidine-calcium  carbonate-magnesium  hydroxide (PEPCID COMPLETE) 10-800-165 MG chewable tablet Chew by mouth.     fenofibrate  (TRICOR ) 145 MG tablet Take 1 tablet (145 mg total) by mouth daily. 90 tablet 3   flecainide  (TAMBOCOR ) 150 MG tablet Take 150 mg by mouth 2 (two) times daily.     guaiFENesin  (MUCINEX ) 600 MG 12 hr tablet Take 2 tablets (1,200 mg total) by mouth 2 (two) times daily. 30 tablet 0   ibuprofen  (ADVIL ) 600 MG tablet Take 600 mg by mouth in the morning and at bedtime.     losartan  (COZAAR ) 25 MG tablet Take 1 tablet (25 mg total) by mouth daily. 90 tablet 3   Magnesium  250 MG TABS Take 1 tablet by mouth once a week.     metFORMIN  (GLUCOPHAGE ) 500 MG tablet TAKE 1 TABLET(500 MG) BY MOUTH TWICE DAILY WITH A MEAL 180 tablet 3   metoprolol  tartrate (LOPRESSOR ) 25 MG tablet Take one tablet by mouth twice a day.  May take one additional tablet by mouth as needed for breakthrough palpitations. 270 tablet 3   pantoprazole  (PROTONIX ) 40 MG tablet Take 1 tablet (40 mg total) by mouth daily. 30 tablet 11   PRESCRIPTION MEDICATION Inhale into the lungs at bedtime. CPAP     rivaroxaban  (XARELTO ) 20 MG TABS tablet Take 1 tablet (20 mg total) by mouth daily with supper. 90 tablet 3   sildenafil  (VIAGRA ) 100 MG tablet Take 1 tablet (100 mg total) by mouth daily as needed for erectile dysfunction. 30 tablet 3   Spacer/Aero-Holding Chambers (AEROCHAMBER MV) inhaler Use as  instructed 1 each 0   tirzepatide (ZEPBOUND) 2.5 MG/0.5ML Pen Inject 2.5 mg into the skin once a week. 2  mL 0   Vitamin D , Ergocalciferol , (DRISDOL ) 1.25 MG (50000 UNIT) CAPS capsule Take 1 capsule (50,000 Units total) by mouth every 7 (seven) days. 8 capsule 3   zonisamide  (ZONEGRAN ) 25 MG capsule Take 2 capsules (50 mg total) by mouth daily. 60 capsule 0   No current facility-administered medications on file prior to visit.    BP 124/70   Pulse 66   Resp 18   Ht 5' 7.5" (1.715 m)   Wt 262 lb (118.8 kg)   SpO2 95%   BMI 40.43 kg/m        Objective:   Physical Exam  General Mental Status- Alert. General Appearance- Not in acute distress.   Skin General: Color- Normal Color. Moisture- Normal Moisture.  Neck No JVD.  Chest and Lung Exam Auscultation: Breath Sounds:-CTA  Cardiovascular Auscultation:Rythm- RRR Murmurs & Other Heart Sounds:Auscultation of the heart reveals- No Murmurs.  Abdomen Inspection:-Inspeection Normal. Palpation/Percussion:Note:No mass. Palpation and Percussion of the abdomen reveal- Non Tender, Non Distended + BS, no rebound or guarding.  Neurologic Cranial Nerve exam:- CN III-XII intact(No nystagmus), symmetric smile. Strength:- 5/5 equal and symmetric strength both upper and lower extremities.       Assessment & Plan:  Assessment and Plan    Obesity Obesity with BMI near 40 affects surgical eligibility. Weight loss essential to maintain BMI below 40 for knee surgery insurance coverage. Zepbound prescribed to aid weight loss, with potential side effects including nausea, constipation, and rare pancreatitis risk. Weight loss may improve sleep apnea, hypertension, and joint pain. - Start Zepbound for weight loss. - Monitor for side effects such as nausea and constipation. - Report any symptoms of pancreatitis. - Maintain BMI below 40 for surgical eligibility.  Severe Sleep Apnea Severe sleep apnea previously diagnosed. Weight loss may improve symptoms. Zepbound prescribed for weight loss, which may benefit sleep  apnea.  Hypertension Hypertension well-controlled. Advised to take losartan  daily and monitor blood pressure. Weight loss may reduce blood pressure, potentially allowing medication adjustment. - Take losartan  daily. - Monitor blood pressure regularly. - Report if blood pressure consistently below 110.  Hyperlipidemia Mild to moderate hyperlipidemia managed with Zetia  and fenofibrate  due to previous statin reactions. Weight loss may improve lipid profile.  Fatty Liver Fatty liver noted on previous imaging. Weight loss may improve liver condition.  Vitamin D  deficiency Vitamin D  deficiency. Vitamin D  levels not checked in recent labs. - Order vitamin D  level.  Back Pain Intermittent back pain medial scapula rt side area , possibly muscular, responds to topical treatment. Differential includes pleuritic pain, but no current pleurisy or gallbladder issues. No rash and no pain today.   Follow-up Follow-up needed for lab results and potential pre-operative clearance for knee surgery. Discussed bone density testing due to family history of soft bone disease. - Order B12 level. - Order TSH and T4 levels. - Schedule follow-up appointment based on lab results or in late July for pre-operative clearance. - Consider bone density testing due to family history.       Dell Hurtubise, PA-C   Time spent with patient today was 42  minutes which consisted of chart review, discussing diagnosis, work up treatment and documentation.

## 2024-01-20 NOTE — Telephone Encounter (Signed)
 Surgical clearance for knee surgery placed in red folder

## 2024-01-21 ENCOUNTER — Ambulatory Visit: Payer: Self-pay | Admitting: Medical

## 2024-01-25 ENCOUNTER — Ambulatory Visit (INDEPENDENT_AMBULATORY_CARE_PROVIDER_SITE_OTHER): Payer: Medicare (Managed Care) | Admitting: Internal Medicine

## 2024-01-30 ENCOUNTER — Telehealth: Payer: Self-pay

## 2024-01-30 NOTE — Telephone Encounter (Signed)
Yes and yes. thx

## 2024-01-30 NOTE — Telephone Encounter (Signed)
 Patient stopped by office and provided approval notice for prior approval of synvisc one in his knees. Dr Rozelle Corning are you ok with us  trying this? April will you see if you can submit for PA.  Also Dr Rozelle Corning he stated that you had mentioned something about a test for soft bone at his last appt. Do not see mentioned in your note. Assume he is referring to bone density test to rule out osteoporosis? Do you want me to get this ordered for him? Please advise.

## 2024-01-31 ENCOUNTER — Other Ambulatory Visit: Payer: Self-pay

## 2024-01-31 NOTE — Telephone Encounter (Signed)
 Order has been sent to [facility]

## 2024-02-01 NOTE — Telephone Encounter (Signed)
 Its in the scheduled orders workque.

## 2024-02-03 NOTE — Therapy (Signed)
 OUTPATIENT PHYSICAL THERAPY LOWER EXTREMITY EVALUATION   Patient Name: Richard Walker MRN: [id number] DOB:[birth year], 68 y.o., male Today's Date: 02/08/2024   END OF SESSION:  PT End of Session - 02/08/24 0930     Visit Number 1    Date for PT Re-Evaluation --   Will need post-op eval once surgery scheduled and orders received from MD   Authorization Type Medicare & [employer] - [employer]    PT Start Time 0930    PT Stop Time 1021    PT Time Calculation (min) 51 min    Activity Tolerance Patient tolerated treatment well    Behavior During Therapy WFL for tasks assessed/performed          Past Medical History:  Diagnosis Date   DDD (degenerative disc disease), lumbar    L3/L4   Diverticulosis    GERD (gastroesophageal reflux disease)    Hyperlipidemia    Low sperm motility    Pre-diabetes    Sleep apnea    uses cpap    SVT (supraventricular tachycardia) (HCC)    Past Surgical History:  Procedure Laterality Date   AMPUTATION Left 07/15/2018   Procedure: . REPAIR OF LEFT INDEX AND LONG FINGER BY FUSION /  REPAIR OF RING FINGER;  Surgeon: Rober Chimera, MD;  Location: [facility] OR;  Service: Orthopedics;  Laterality: Left;   BACK SURGERY     BLALOCK PROCEDURE     Ablation   CARDIAC CATHETERIZATION N/A 12/22/2015   Procedure: Left Heart Cath and Coronary Angiography;  Surgeon: Peter M Swaziland, MD;  Location: [facility] INVASIVE CV LAB;  Service: Cardiovascular;  Laterality: N/A;   CARDIAC ELECTROPHYSIOLOGY STUDY AND ABLATION     ELECTROPHYSIOLOGIC STUDY N/A 02/19/2016   Procedure: SVT Ablation;  Surgeon: Tammie Fall, MD;  Location: [facility] INVASIVE CV LAB;  Service: Cardiovascular;  Laterality: N/A;   HAND SURGERY     septal reconstruction  2017   TURBINATE REDUCTION Bilateral 07/2016   WISDOM TOOTH EXTRACTION     Patient Active Problem List   Diagnosis Date Noted   Metabolic dysfunction-associated steatotic liver disease (MASLD) 01/13/2024   Abnormal food  appetite 11/17/2023   Insulin  resistance 11/17/2023   Class 2 severe obesity due to excess calories with serious comorbidity and body mass index (BMI) of 35.0 to 35.9 in adult (HCC) 11/03/2023   Elevated blood sugar 11/03/2023   Prediabetes 11/03/2023   Degenerative disc disease, lumbar 12/09/2022   Morbid obesity (HCC) 10/26/2022   Bronchitis 01/29/2022   OSA on CPAP 02/19/2021   Bite, snake 01/28/2021   Neck pain 11/13/2020   Vertigo 11/13/2020   Hypertension 11/03/2020   Contusion of bone 10/23/2020   Degenerative arthritis of knee, bilateral 10/23/2020   Concussion with no loss of consciousness 10/23/2020   Paroxysmal atrial fibrillation (HCC) 05/22/2019   Unspecified atrial flutter (HCC) 05/22/2019   Pain in right foot 03/30/2018   Perennial and seasonal allergic rhinitis 11/09/2017   History of food allergy 11/09/2017   Wheezing 11/09/2017   Dyspnea/wheezing 10/31/2017   Cough 10/31/2017   Bronchitis, mucopurulent recurrent (HCC) 09/07/2017   SVT (supraventricular tachycardia) (HCC) 04/23/2015   Abdominal pain, epigastric 06/03/2013   Vitamin D  deficiency 04/09/2013   Esophageal reflux 04/09/2013   Hepatitis B carrier (HCC) 04/09/2013   Other and unspecified hyperlipidemia 04/09/2013   Sleep apnea 04/09/2013    PCP: Sylvia Everts, PA-C   REFERRING PROVIDER: [name] Mesi, MD   REFERRING DIAG: M25.561,M25.562,G89.29 (ICD-10-CM) - Bilateral chronic knee  pain   THERAPY DIAG:  Chronic pain of both knees  Muscle weakness (generalized)  RATIONALE FOR EVALUATION AND TREATMENT: Rehabilitation  ONSET DATE: Acute on chronic -  B TKA pending, L knee to be done first  NEXT MD VISIT: Surgery date TBD  SUBJECTIVE:                                                                                                                                                                                                         SUBJECTIVE STATEMENT: Pt here for pre-op TKA visit  however surgery date not yet scheduled.  Has previously completed extensive PT for B knee pain in 2022 (37 visits).  He currently notes limited standing and walking tolerance for only a few minutes at a time.  PAIN: Are you having pain? Yes: NPRS scale: 7-8/10 in sitting, 9-10/10 with standing and walking Pain location: B knees  Pain description: sharp, stabbing  Aggravating factors: standing and walking  Relieving factors: ibuprofen    PERTINENT HISTORY:  Lumbar DDD (L3-4), prediabetes, SVT, paroxysmal A-fib, HTN, HLD, GERD, OSA, morbid obesity  PRECAUTIONS: None  RED FLAGS: None  WEIGHT BEARING RESTRICTIONS: No  FALLS:  Has patient fallen in last 6 months? No  LIVING ENVIRONMENT: Lives with: lives with their family Lives in: House/apartment Stairs: Yes: Internal: 15 steps; on right going up, on left going up, and can reach both and External: 2 steps; none, bedroom on 2nd level Has following equipment at home: None  OCCUPATION: Retired  PLOF: Independent and Leisure: mostly sedentary   PATIENT GOALS: To be able to walk to lose more weight.   OBJECTIVE: (objective measures completed at initial evaluation unless otherwise dated)  DIAGNOSTIC FINDINGS:  02/01/23 - DG B knees Right -  FINDINGS: Mild partial loss of joint space medially. No other significant bony or soft tissue abnormalities.   IMPRESSION: Mild partial loss of joint space medially.  Left - FINDINGS: Medial compartment degenerative changes with small osteophytes. Lucencies along the posterior aspect of the patella on the lateral view suggests degenerative change. No definite effusion. No fractures. No other abnormalities.   IMPRESSION: Degenerative changes as above.  PATIENT SURVEYS:  LEFS: 15 / 80 = 18.8 %, severe functional limitation Extreme difficulty/unable (0), Quite a bit of difficulty (1), Moderate difficulty (2), Little difficulty (3), No difficulty (4) Survey date:  02/08/24 Pre-op TKA   Any of your usual work, housework or school activities 0  2. Usual hobbies, recreational or sporting activities 0  3. Getting into/out of the bath 2  4. Walking between  rooms 2  5. Putting on socks/shoes 3  6. Squatting  0  7. Lifting an object, like a bag of groceries from the floor 1  8. Performing light activities around your home 0  9. Performing heavy activities around your home 0  10. Getting into/out of a car 1  11. Walking 2 blocks 0  12. Walking 1 mile 0  13. Going up/down 10 stairs (1 flight) 1  14. Standing for 1 hour 0  15.  sitting for 1 hour 3  16. Running on even ground 0  17. Running on uneven ground 0  18. Making sharp turns while running fast 0  19. Hopping  0  20. Rolling over in bed 2  Score total:  15 / 80 = 18.8 %     COGNITION: Overall cognitive status: Within functional limits for tasks assessed    SENSATION: WFL  EDEMA:  N/A  POSTURE:  Wide BOS  PALPATION: NT  MUSCLE LENGTH: Hamstrings: Mod tight B ITB: Mild tight B Piriformis: Mod tight L>R Hip flexors: WFL Quads: WFL  LOWER EXTREMITY ROM:  AROM Seated / Supine Right eval Left eval  Knee flexion 130 / 137 132 / 140  Knee extension 9 / 2 5 / 0  (Blank rows = not tested)  LOWER EXTREMITY MMT:  MMT Right eval Left eval  Hip flexion 5 5  Hip extension 5 5  Hip abduction 5 5  Hip adduction 5 4+  Hip internal rotation 5 4  Hip external rotation 4+ 4-  Knee flexion 5 5  Knee extension 4+ 4+  Ankle dorsiflexion 5 5   Ankle plantarflexion    Ankle inversion    Ankle eversion     (Blank rows = not tested)   GAIT: Distance walked: Clinic distances Assistive device utilized: None Level of assistance: Complete Independence Gait pattern: step through pattern, decreased stride length, decreased gait speed and antalgic Comment: Provided education in proper gait pattern with use of RW for postop mobility, including stair negotiation   TODAY'S TREATMENT:   02/08/2024 -  Eval SELF CARE:  Reviewed eval findings and role of PT in addressing identified deficits as well as instruction in initial HEP (see below).    PATIENT EDUCATION:  Education details: PT eval findings, initial HEP, transfer safety, gait safety with RW including stair negotiation, and education on postop PT POC   Person educated: Patient Education method: Explanation, Demonstration, Verbal cues, and Handouts Education comprehension: verbalized understanding, returned demonstration, and verbal cues required  HOME EXERCISE PROGRAM: Access Code: [email or web address] URL: [email or web address] Date: 02/08/2024 Prepared by: Felecia Hopper  Exercises - Supine Hamstring Stretch with Strap  - 1-2 x daily - 7 x weekly - 3 reps - 30 sec hold - Supine Piriformis Stretch with Foot on Ground  - 1-2 x daily - 7 x weekly - 3 reps - 30 sec hold - Quad Setting and Stretching  - 1 x daily - 7 x weekly - 2 sets - 10 reps - 3-5 sec hold - Active Straight Leg Raise with Quad Set  - 1 x daily - 7 x weekly - 2 sets - 10 reps - 3-5 sec hold - Seated Long Arc Quad with Hip Adduction  - 1 x daily - 7 x weekly - 2 sets - 10 reps - 3-5 sec hold  Patient Education - TKR: Preparing Your Home for Knee Replacement Surgery - Pain Management   ASSESSMENT:  CLINICAL IMPRESSION: COLBE VIVIANO is a 68  y.o. male who was referred to physical therapy for evaluation and treatment for chronic B knee pain.  He previously completed an extensive PT episode in 2022 for B knee pain, however he reports B knee pain/osteoarthritis has progressed to the point where TKA has been recommended but not yet scheduled.  MD wanting him to have a one-time PT pre-op visit to prepare patient for surgery and provide instruction in HEP for quad strengthening.  Pain is worse with any prolonged standing or walking.  Patient has deficits in B knee extension ROM, proximal LE flexibility, and B LE strength which are interfering with ADLs and are impacting  quality of life.  On LEFS patient scored 15/80 demonstrating severe functional limitation.  Education provided on post-op PT POC including proper use of RW for gait and stair negotiation.  HEP provided to address flexibility, ROM and strength deficits identified today to help prepare him for best outcomes after surgery.  Trevious will benefit from further skilled PT post-op to address anticipated pain, range of motion and strength deficits to improve mobility and activity tolerance with decreased pain interference.  OBJECTIVE IMPAIRMENTS: Abnormal gait, decreased activity tolerance, decreased knowledge of condition, decreased mobility, difficulty walking, decreased ROM, decreased strength, impaired flexibility, and pain.   ACTIVITY LIMITATIONS: carrying, lifting, bending, standing, squatting, stairs, and locomotion level  PARTICIPATION LIMITATIONS: meal prep, cleaning, laundry, shopping, community activity, yard work, and prayer  PERSONAL FACTORS: Fitness, Past/current experiences, Time since onset of injury/illness/exacerbation, and 3+ comorbidities: Lumbar DDD (L3-4), prediabetes, SVT, paroxysmal A-fib, HTN, HLD, GERD, OSA, morbid obesity are also affecting patient's functional outcome.   REHAB POTENTIAL: Good  CLINICAL DECISION MAKING: Stable/uncomplicated  EVALUATION COMPLEXITY: Low   GOALS: Goals reviewed with patient? Yes  SHORT & LONG TERM GOALS: Target date: 02/08/2024   Patient will be independent with pre-op HEP. Baseline:  Goal status: MET  2.  Patient will verbalize/demonstrate understanding of proper use of RW including stair negotiation. Baseline:  Goal status: MET  PLAN:  PT FREQUENCY: one time visit  PT DURATION: 1 session  PLANNED INTERVENTIONS: 97110-Therapeutic exercises, 97530- Therapeutic activity, 97535- Self Care, and 16109- Gait training  PLAN FOR NEXT SESSION: Patient to return for post-op TKA eval as indicated per surgeon   Francisco Irving, PT 02/08/2024,  2:40 PM

## 2024-02-08 ENCOUNTER — Encounter: Payer: Self-pay | Admitting: Medical

## 2024-02-08 ENCOUNTER — Telehealth: Payer: Self-pay | Admitting: Medical

## 2024-02-08 ENCOUNTER — Encounter: Payer: Self-pay | Admitting: Physical Therapy

## 2024-02-08 ENCOUNTER — Other Ambulatory Visit: Payer: Self-pay

## 2024-02-08 ENCOUNTER — Ambulatory Visit: Payer: Medicare (Managed Care) | Attending: Orthopedic Surgery | Admitting: Physical Therapy

## 2024-02-08 DIAGNOSIS — M6281 Muscle weakness (generalized): Secondary | ICD-10-CM | POA: Diagnosis present

## 2024-02-08 DIAGNOSIS — M25562 Pain in left knee: Secondary | ICD-10-CM | POA: Insufficient documentation

## 2024-02-08 DIAGNOSIS — M25561 Pain in right knee: Secondary | ICD-10-CM | POA: Insufficient documentation

## 2024-02-08 DIAGNOSIS — G8929 Other chronic pain: Secondary | ICD-10-CM | POA: Insufficient documentation

## 2024-02-08 NOTE — Telephone Encounter (Signed)
 Please fax over ortho surgery risk assessment form.

## 2024-02-08 NOTE — Telephone Encounter (Signed)
 faxed

## 2024-02-09 ENCOUNTER — Encounter: Payer: Self-pay | Admitting: Gastroenterology

## 2024-02-14 ENCOUNTER — Encounter (INDEPENDENT_AMBULATORY_CARE_PROVIDER_SITE_OTHER): Payer: Self-pay | Admitting: Internal Medicine

## 2024-02-14 ENCOUNTER — Ambulatory Visit (INDEPENDENT_AMBULATORY_CARE_PROVIDER_SITE_OTHER)
Admission: RE | Admit: 2024-02-14 | Discharge: 2024-02-14 | Disposition: A | Discharge status: Home / Self Care | Payer: Medicare (Managed Care) | Source: Ambulatory Visit | Attending: Orthopedic Surgery | Admitting: Orthopedic Surgery

## 2024-02-14 ENCOUNTER — Other Ambulatory Visit (INDEPENDENT_AMBULATORY_CARE_PROVIDER_SITE_OTHER): Payer: Self-pay | Admitting: Internal Medicine

## 2024-02-14 ENCOUNTER — Other Ambulatory Visit (HOSPITAL_BASED_OUTPATIENT_CLINIC_OR_DEPARTMENT_OTHER): Payer: Self-pay

## 2024-02-14 ENCOUNTER — Ambulatory Visit (INDEPENDENT_AMBULATORY_CARE_PROVIDER_SITE_OTHER): Payer: Medicare (Managed Care) | Admitting: Internal Medicine

## 2024-02-14 ENCOUNTER — Telehealth (INDEPENDENT_AMBULATORY_CARE_PROVIDER_SITE_OTHER): Payer: Self-pay | Admitting: Internal Medicine

## 2024-02-14 VITALS — BP 138/84 | HR 62 | Temp 97.8°F | Ht 68.0 in | Wt 247.0 lb

## 2024-02-14 DIAGNOSIS — E66812 Obesity, class 2: Secondary | ICD-10-CM

## 2024-02-14 DIAGNOSIS — K760 Fatty (change of) liver, not elsewhere classified: Secondary | ICD-10-CM

## 2024-02-14 DIAGNOSIS — Z13820 Encounter for screening for osteoporosis: Secondary | ICD-10-CM

## 2024-02-14 DIAGNOSIS — K5900 Constipation, unspecified: Secondary | ICD-10-CM | POA: Insufficient documentation

## 2024-02-14 DIAGNOSIS — R7303 Prediabetes: Secondary | ICD-10-CM | POA: Diagnosis not present

## 2024-02-14 DIAGNOSIS — Z6835 Body mass index (BMI) 35.0-35.9, adult: Secondary | ICD-10-CM

## 2024-02-14 DIAGNOSIS — I10 Essential (primary) hypertension: Secondary | ICD-10-CM

## 2024-02-14 DIAGNOSIS — G8929 Other chronic pain: Secondary | ICD-10-CM

## 2024-02-14 DIAGNOSIS — G4733 Obstructive sleep apnea (adult) (pediatric): Secondary | ICD-10-CM

## 2024-02-14 MED ORDER — TIRZEPATIDE-WEIGHT MANAGEMENT 5 MG/0.5ML ~~LOC~~ SOAJ
5.0000 mg | SUBCUTANEOUS | 0 refills | Status: DC
  Filled 2024-02-14: qty 2, 28d supply, fill #0

## 2024-02-14 MED ORDER — ZONISAMIDE 25 MG PO CAPS
ORAL_CAPSULE | ORAL | 0 refills | Status: DC
  Filled 2024-02-14: qty 6, 9d supply, fill #0

## 2024-02-14 NOTE — Progress Notes (Signed)
 Office: [phone]  /  Fax: [phone]  Weight Summary and Body Composition Analysis (BIA)  Vitals Temp: 97.8 F (36.6 C) BP: 138/84 Pulse Rate: 62 SpO2: 97 %   Anthropometric Measurements Height: 5' 8 (1.727 m) Weight: 247 lb (112 kg) BMI (Calculated): 37.56 Weight at Last Visit: 252 lb Weight Lost Since Last Visit: 5 lb Weight Gained Since Last Visit: 0 lb Starting Weight: 264 lb Total Weight Loss (lbs): 17 lb (7.711 kg) Peak Weight: 270 lb   Body Composition  Body Fat %: 34.1 % Fat Mass (lbs): 84.4 lbs Muscle Mass (lbs): 155.4 lbs Total Body Water (lbs): 112.8 lbs Visceral Fat Rating : 22    RMR: 2405  Today's Visit #: 5  Starting Date: 11/03/23   Subjective   Chief Complaint: Obesity  Interval History  Discussed the use of AI scribe software for clinical note transcription with the patient, who gave verbal consent to proceed.  History of Present Illness   Richard Walker is a 68 year old male with obesity, atrial fibrillation, hypertension, and sleep apnea who presents for medical weight management.  He has a history of obesity, atrial fibrillation, hypertension, sleep apnea managed with CPAP, metabolic-associated steatotic liver disease (MASLD), prediabetes, and insulin  resistance. He has recently lost 17 pounds, with 5 pounds lost since his last visit. He is currently on Zepbound  2.5 mg weekly and zonisamide  50 mg in the evening. He follows a diet with reduced carbohydrates and increased whole foods, achieving about 50-60% adherence to dietary principles. He exercises 5 days a week for 10-20 minutes, primarily cardio, but faces challenges due to orthopedic issues. He reports adequate sleep and no high stress levels.  He experiences side effects from zonisamide , including muscle tightness in the back and shoulders.He also reports excessive burping, particularly at night, which he associates with constipation. He drinks 6-7 bottles of water daily and  acknowledges a potential shortfall in dietary fiber intake. He consumes fruits regularly but needs to increase vegetable intake.  He experiences significant constipation, which he believes contributes to his burping. He drinks adequate water but may lack sufficient dietary fiber. He consumes fruits but needs to increase his intake of vegetables and whole grains.  He has bilateral knee osteoarthritis and low back pain, which limit his physical activity. He is concerned about his ability to exercise post-surgery and is preparing for potential knee surgery. Maintaining a lower body mass index is crucial for surgical eligibility.  He is mindful of his protein intake to prevent muscle loss during weight reduction. He consumes eggs, yogurt, and fish, and is considering protein shakes to supplement his diet. He aims for 30 grams of protein per meal to support muscle mass retention.        Challenges affecting patient progress: strong hunger signals and/or impaired satiety / inhibitory control, difficulty implementing reduced calorie nutrition plan, orthopedic problems, medical conditions or chronic pain affecting mobility, and medical comorbidities.    Pharmacotherapy for weight management: He is currently taking Zepbound  with adequate clinical response  and experiencing the following side effects: Some constipation and sulfur burps. and Zonisamide  (off label use, single agent) with adequate clinical response  and experiencing the following side effects: Muscle spasms..   Assessment and Plan   Treatment Plan For Obesity:  Recommended Dietary Goals  Lathen is currently in the action stage of change. As such, his goal is to continue weight management plan. He has agreed to: continue current plan  Behavioral Health and Counseling  We  discussed the following behavioral modification strategies today: continue to work on maintaining a reduced calorie state, getting the recommended amount of protein,  incorporating whole foods, making healthy choices, staying well hydrated and practicing mindfulness when eating..  Additional education and resources provided today: None  Recommended Physical Activity Goals  Shandy has been advised to work up to 150 minutes of moderate intensity aerobic activity a week and strengthening exercises 2-3 times per week for cardiovascular health, weight loss maintenance and preservation of muscle mass.   He has agreed to :  Think about enjoyable ways to increase daily physical activity and overcoming barriers to exercise and Increase physical activity in their day and reduce sedentary time (increase NEAT).  Medical Interventions and Pharmacotherapy  We discussed various medication options to help Kinsey with his weight loss efforts and we both agreed to : Increase Zepbound  to 5 mg once a week for the management of obesity and related conditions.  Patient again counseled on nutritional strategies to prevent side effects associated with GLP-1 treatment including increased consumption of fiber, maintaining adequate hydration and also maintaining an adequate amount of protein for him minimally 90 g/day or 30 g per meal has been recommended.  Associated Conditions Impacted by Obesity Treatment  Prediabetes Assessment & Plan: Based on most recent hemoglobin A1c of 6.0.  He is currently on metformin  XR 500 mg twice a day and Zepbound  and will continue medication.  He will continue to take B12 supplementation.  Patient was educated on the carb insulin  model of obesity today and the importance of maintaining a diet with a low glycemic load.  He has reduced simple carbs in his diet significantly and has been eating more fruits and vegetables.  Orders: -     Tirzepatide -Weight Management; Inject 5 mg into the skin once a week.  Dispense: 2 mL; Refill: 0  OSA on CPAP Assessment & Plan: Reviewed sleep study from July 2022 he had an AHI of 50 with oxygen desaturations he is  currently on PAP therapy but still has an elevated Epworth around 13 despite adjustments in pressures.  I feel that patient will benefit from weight loss aided by GLP-1 to reduce AHI and symptoms related to sleep disordered breathing.  It would also help with the management of other obesity related comorbidities as outlined in obesity care plan.  We will increase Zepbound  to 5 mg once a week  Orders: -     Tirzepatide -Weight Management; Inject 5 mg into the skin once a week.  Dispense: 2 mL; Refill: 0  Class 2 severe obesity due to excess calories with serious comorbidity and body mass index (BMI) of 35.0 to 35.9 in adult [facility]) Assessment & Plan: Hridaan has lost 17 pounds since starting her program he is now on Zepbound  which will help with his obesity and related conditions.  He will be having joint surgery in the near future.  I have asked him to discuss preparation preferences with anesthesiologist as guidelines have been updated and no longer recommend holding GLP-1 for a week he may just do a clear diet the day before.Undergoing medical weight management for obesity, a chronic disease. Lost 17 pounds, approximately 10% of body weight, and body fat percentage decreased from 37% to 34%. Goal is to reduce body fat percentage to under 25%. Currently on Zepbound  2.5 mg weekly, increasing to 5 mg. Medication aids appetite control and weight loss but causes constipation and burping due to slowed gastric motility. Advised to increase fiber intake and  hydration to manage constipation. Discussed that gradual weight loss is healthier and more sustainable, aiming for 1-2 pounds per week. Potential to lose up to 20-22% of body weight with Zepbound . Emphasized importance of maintaining weight loss for 1-2 years to reset body weight set point. - Increase Zepbound  to 5 mg weekly. - Advise to increase fiber intake through fruits, vegetables, and whole grains. - Recommend Miralax 1-2 servings daily for constipation. -  Advise on hydration: 6-7 water bottles daily. - Discuss protein intake: 30 grams per meal, consider protein shakes if needed. - Discussed with anesthesiologist for the management of GLP-1 perioperative as above.  Orders: -     Tirzepatide -Weight Management; Inject 5 mg into the skin once a week.  Dispense: 2 mL; Refill: 0  Metabolic dysfunction-associated steatotic liver disease (MASLD) Assessment & Plan: He has hepatic steatosis on CT scan completed in January of this year.  Fibrosis 4 Score = 1.3  He does not drink alcohol and is not on steatogenic medication.  Losing 15% of body weight may improve condition.  He is also working on reducing saturated fats simple and processed sugars in his diet and is engaging in physical activity.  He is now on GLP-1 treatment.  Zepbound  will be increased to 5 mg once a week     Orders: -     Tirzepatide -Weight Management; Inject 5 mg into the skin once a week.  Dispense: 2 mL; Refill: 0  Primary hypertension Assessment & Plan: Blood pressure slightly above goal he is on metoprolol  and ARB.  Zepbound  may help improve blood pressure control medication will be increased to 5 mg once a week.  Goal blood pressure of less than 130/80   Constipation, unspecified constipation type Assessment & Plan: Worsened by GLP-1 treatment.  We again counseled patient on increasing fiber intake maintaining adequate hydration.  He may also use MiraLAX and Dulcolax as needed.       Bilateral Knee Osteoarthritis Has bilateral knee osteoarthritis, limiting exercise ability. Preparing for knee surgery, advised to maintain physical activity to improve surgical outcomes. Discussed importance of physical therapy post-surgery to prevent stiffness and improve recovery. - Encourage aerobic exercises like walking and chair exercises. - Advise on physical therapy to strengthen muscles pre-surgery.        Objective   Physical Exam:  Blood pressure 138/84, pulse  62, temperature 97.8 F (36.6 C), height 5' 8 (1.727 m), weight 247 lb (112 kg), SpO2 97%. Body mass index is 37.56 kg/m.  General: He is overweight, cooperative, alert, well developed, and in no acute distress. PSYCH: Has normal mood, affect and thought process.   HEENT: EOMI, sclerae are anicteric. Lungs: Normal breathing effort, no conversational dyspnea. Extremities: No edema.  Neurologic: No gross sensory or motor deficits. No tremors or fasciculations noted.    Diagnostic Data Reviewed:  BMET    Component Value Date/Time   NA 138 01/18/2024 0935   NA 138 11/03/2023 1117   K 4.6 01/18/2024 0935   CL 101 01/18/2024 0935   CO2 26 01/18/2024 0935   GLUCOSE 111 (H) 01/18/2024 0935   BUN 23 01/18/2024 0935   BUN 19 11/03/2023 1117   CREATININE 1.12 01/18/2024 0935   CREATININE 1.18 02/12/2016 1508   CALCIUM  9.7 01/18/2024 0935   GFRNONAA >60 09/22/2023 1405   GFRNONAA >89 03/04/2014 1456   GFRAA >60 07/15/2018 1324   GFRAA >89 03/04/2014 1456   Lab Results  Component Value Date   HGBA1C 5.9 01/18/2024   HGBA1C  5.7 12/24/2015   Lab Results  Component Value Date   INSULIN  25.3 (H) 11/03/2023   Lab Results  Component Value Date   TSH 1.81 01/20/2024   CBC    Component Value Date/Time   WBC 6.4 01/20/2024 1057   RBC 5.49 01/20/2024 1057   HGB 15.5 01/20/2024 1057   HCT 46.5 01/20/2024 1057   PLT 195.0 01/20/2024 1057   MCV 84.7 01/20/2024 1057   MCH 27.8 09/22/2023 1405   MCHC 33.4 01/20/2024 1057   RDW 14.0 01/20/2024 1057   Iron Studies    Component Value Date/Time   IRON 69 06/28/2018 1611   Lipid Panel     Component Value Date/Time   CHOL 209 (H) 01/18/2024 0935   TRIG 212.0 (H) 01/18/2024 0935   HDL 42.40 01/18/2024 0935   CHOLHDL 5 01/18/2024 0935   VLDL 42.4 (H) 01/18/2024 0935   LDLCALC 124 (H) 01/18/2024 0935   LDLDIRECT 150.0 03/10/2023 0951   Hepatic Function Panel     Component Value Date/Time   PROT 6.9 01/18/2024 0935   PROT  6.9 11/03/2023 1117   ALBUMIN 4.3 01/18/2024 0935   ALBUMIN 4.3 11/03/2023 1117   AST 16 01/18/2024 0935   ALT 23 01/18/2024 0935   ALKPHOS 83 01/18/2024 0935   BILITOT 0.4 01/18/2024 0935   BILITOT 0.3 11/03/2023 1117   BILIDIR 0.1 01/06/2017 1600      Component Value Date/Time   TSH 1.81 01/20/2024 1057   Nutritional Lab Results  Component Value Date   VD25OH 55.49 01/20/2024   VD25OH 35.78 09/12/2023   VD25OH 24.68 (L) 03/10/2023    Medications: Outpatient Encounter Medications as of 02/14/2024  Medication Sig Note   acetaminophen  (TYLENOL ) 325 MG tablet Take 2 tablets (650 mg total) by mouth every 6 (six) hours.    albuterol  (PROAIR  HFA) 108 (90 Base) MCG/ACT inhaler Inhale 1-2 puffs into the lungs every 6 (six) hours as needed for wheezing or shortness of breath.    azelastine  (ASTELIN ) 0.1 % nasal spray 1-2 sprays per nostril 2 times daily as needed.    benzonatate  (TESSALON ) 200 MG capsule Take 1 capsule (200 mg total) by mouth 2 (two) times daily as needed for cough.    Budeson-Glycopyrrol-Formoterol  (BREZTRI  AEROSPHERE) 160-9-4.8 MCG/ACT AERO Inhale 2 puffs into the lungs in the morning and at bedtime.    doxycycline  (VIBRA -TABS) 100 MG tablet Take 1 tablet (100 mg total) by mouth 2 (two) times daily.    ezetimibe  (ZETIA ) 10 MG tablet Take 1 tablet (10 mg total) by mouth daily.    famotidine-calcium  carbonate-magnesium  hydroxide (PEPCID COMPLETE) 10-800-165 MG chewable tablet Chew by mouth.    fenofibrate  (TRICOR ) 145 MG tablet Take 1 tablet (145 mg total) by mouth daily.    flecainide  (TAMBOCOR ) 150 MG tablet Take 150 mg by mouth 2 (two) times daily.    guaiFENesin  (MUCINEX ) 600 MG 12 hr tablet Take 2 tablets (1,200 mg total) by mouth 2 (two) times daily.    ibuprofen  (ADVIL ) 600 MG tablet Take 600 mg by mouth in the morning and at bedtime.    losartan  (COZAAR ) 25 MG tablet Take 1 tablet (25 mg total) by mouth daily.    Magnesium  250 MG TABS Take 1 tablet by mouth once  a week.    metFORMIN  (GLUCOPHAGE ) 500 MG tablet TAKE 1 TABLET(500 MG) BY MOUTH TWICE DAILY WITH A MEAL    metoprolol  tartrate (LOPRESSOR ) 25 MG tablet Take one tablet by mouth twice a day.  May  take one additional tablet by mouth as needed for breakthrough palpitations.    pantoprazole  (PROTONIX ) 40 MG tablet Take 1 tablet (40 mg total) by mouth daily.    PRESCRIPTION MEDICATION Inhale into the lungs at bedtime. CPAP    rivaroxaban  (XARELTO ) 20 MG TABS tablet Take 1 tablet (20 mg total) by mouth daily with supper.    sildenafil  (VIAGRA ) 100 MG tablet Take 1 tablet (100 mg total) by mouth daily as needed for erectile dysfunction.    Spacer/Aero-Holding Chambers (AEROCHAMBER MV) inhaler Use as instructed    tirzepatide  (ZEPBOUND ) 2.5 MG/0.5ML Pen Inject 2.5 mg into the skin once a week.    tirzepatide  (ZEPBOUND ) 5 MG/0.5ML Pen Inject 5 mg into the skin once a week.    Vitamin D , Ergocalciferol , (DRISDOL ) 1.25 MG (50000 UNIT) CAPS capsule Take 1 capsule (50,000 Units total) by mouth every 7 (seven) days.    [DISCONTINUED] zonisamide  (ZONEGRAN ) 25 MG capsule Take 2 capsules (50 mg total) by mouth daily. 02/14/2024: Muscle spasm   No facility-administered encounter medications on file as of 02/14/2024.     Follow-Up   Return in about 4 weeks (around 03/13/2024) for For Weight Mangement with Dr. Francyne.SABRA He was informed of the importance of frequent follow up visits to maximize his success with intensive lifestyle modifications for his multiple health conditions.  Attestation Statement   Reviewed by clinician on day of visit: allergies, medications, problem list, medical history, surgical history, family history, social history, and previous encounter notes.   I have spent 47 minutes in the care of the patient today including: 3 minutes before the visit reviewing and preparing the chart. 35 minutes face-to-face assessing and reviewing listed medical problems as outlined in obesity care plan,  providing nutritional and behavioral counseling on topics outlined in the obesity care plan, counseling regarding anti-obesity medication as outlined in obesity care plan, independently interpreting test results and goals of care, as described in assessment and plan, reviewing and discussing biometric information and progress, and ordering medications - see orders 9 minutes after the visit updating chart and documentation of encounter.    Lucas Francyne, MD

## 2024-02-14 NOTE — Telephone Encounter (Signed)
 Pt called back stating he needs a refill for Zonisamide  25mg . He forgot to tell the provider when he was here earlier today. Please follow up with patient.

## 2024-02-14 NOTE — Assessment & Plan Note (Signed)
 Blood pressure slightly above goal he is on metoprolol  and ARB.  Zepbound  may help improve blood pressure control medication will be increased to 5 mg once a week.  Goal blood pressure of less than 130/80

## 2024-02-14 NOTE — Assessment & Plan Note (Signed)
 Based on most recent hemoglobin A1c of 6.0.  He is currently on metformin  XR 500 mg twice a day and Zepbound  and will continue medication.  He will continue to take B12 supplementation.  Patient was educated on the carb insulin  model of obesity today and the importance of maintaining a diet with a low glycemic load.  He has reduced simple carbs in his diet significantly and has been eating more fruits and vegetables.

## 2024-02-14 NOTE — Telephone Encounter (Signed)
 Patient was seen in office today by Dr. Francyne, he states he forgot to ask for a refill when he was here, and so he called in to request a refill. However he could not remember the name of the medication.

## 2024-02-14 NOTE — Assessment & Plan Note (Signed)
 Worsened by GLP-1 treatment.  We again counseled patient on increasing fiber intake maintaining adequate hydration.  He may also use MiraLAX and Dulcolax as needed.

## 2024-02-14 NOTE — Telephone Encounter (Signed)
 Dr. Francyne sent in Zonisamide  for pt to taper down

## 2024-02-14 NOTE — Assessment & Plan Note (Signed)
 Richard Walker has lost 17 pounds since starting her program he is now on Zepbound  which will help with his obesity and related conditions.  He will be having joint surgery in the near future.  I have asked him to discuss preparation preferences with anesthesiologist as guidelines have been updated and no longer recommend holding GLP-1 for a week he may just do a clear diet the day before.Undergoing medical weight management for obesity, a chronic disease. Lost 17 pounds, approximately 10% of body weight, and body fat percentage decreased from 37% to 34%. Goal is to reduce body fat percentage to under 25%. Currently on Zepbound  2.5 mg weekly, increasing to 5 mg. Medication aids appetite control and weight loss but causes constipation and burping due to slowed gastric motility. Advised to increase fiber intake and hydration to manage constipation. Discussed that gradual weight loss is healthier and more sustainable, aiming for 1-2 pounds per week. Potential to lose up to 20-22% of body weight with Zepbound . Emphasized importance of maintaining weight loss for 1-2 years to reset body weight set point. - Increase Zepbound  to 5 mg weekly. - Advise to increase fiber intake through fruits, vegetables, and whole grains. - Recommend Miralax 1-2 servings daily for constipation. - Advise on hydration: 6-7 water bottles daily. - Discuss protein intake: 30 grams per meal, consider protein shakes if needed. - Discussed with anesthesiologist for the management of GLP-1 perioperative as above.

## 2024-02-14 NOTE — Assessment & Plan Note (Signed)
 He has hepatic steatosis on CT scan completed in January of this year.  Fibrosis 4 Score = 1.3  He does not drink alcohol and is not on steatogenic medication.  Losing 15% of body weight may improve condition.  He is also working on reducing saturated fats simple and processed sugars in his diet and is engaging in physical activity.  He is now on GLP-1 treatment.  Zepbound  will be increased to 5 mg once a week

## 2024-02-14 NOTE — Assessment & Plan Note (Signed)
 Reviewed sleep study from July 2022 he had an AHI of 50 with oxygen desaturations he is currently on PAP therapy but still has an elevated Epworth around 13 despite adjustments in pressures.  I feel that patient will benefit from weight loss aided by GLP-1 to reduce AHI and symptoms related to sleep disordered breathing.  It would also help with the management of other obesity related comorbidities as outlined in obesity care plan.  We will increase Zepbound  to 5 mg once a week

## 2024-02-16 ENCOUNTER — Telehealth: Payer: Self-pay

## 2024-02-16 DIAGNOSIS — G8929 Other chronic pain: Secondary | ICD-10-CM

## 2024-02-16 NOTE — Telephone Encounter (Signed)
Called and left a VM for patient to CB to schedule for gel injection with Dr. August Saucer or Franky Macho  See referrals tab

## 2024-02-16 NOTE — Telephone Encounter (Signed)
 Patient would like a call back concerning his Bone Density results and he would like to proceed with surgery for left knee.  CB#[phone].  Please advise.  Thank you

## 2024-02-17 ENCOUNTER — Telehealth: Payer: Self-pay | Admitting: *Deleted

## 2024-02-17 ENCOUNTER — Encounter (HOSPITAL_COMMUNITY): Payer: Self-pay

## 2024-02-17 NOTE — Telephone Encounter (Signed)
 noted

## 2024-02-17 NOTE — Telephone Encounter (Signed)
   Pre-operative Risk Assessment    Patient Name: Richard Walker  DOB: 01-Dec-1955 MRN: [id number]   Date of last office visit: 11/21/23 DR. TAYLOR Date of next office visit: 04/26/24 DR. TAYLOR   Request for Surgical Clearance    Procedure:  LEFT TOTAL KNEE ARTHROPLASTY  Date of Surgery:  Clearance TBD  VERNEDA)                              Surgeon:  DR. JUDITHANN GLENDIA HUTCHINSON Surgeon's Group or Practice Name:  [facility] Phone number:  [phone] Fax number:  [phone]   Type of Clearance Requested:   - Medical  - Pharmacy:  Hold Rivaroxaban  (Xarelto )     Type of Anesthesia:  Spinal   Additional requests/questions:    [name]   02/17/2024, 12:10 PM

## 2024-02-17 NOTE — Telephone Encounter (Signed)
 Pharmacy please advise on holding Xarelto  prior to  LEFT TOTAL KNEE ARTHROPLASTY scheduled for August 2025 (not specific on date). Thank you.

## 2024-02-17 NOTE — Telephone Encounter (Signed)
 I called and detailed the bone density results and a voicemail.  I also spoke with Richard Walker and she has surgery sheet for him already for left knee replacement

## 2024-02-21 ENCOUNTER — Encounter (HOSPITAL_COMMUNITY)
Admission: RE | Admit: 2024-02-21 | Discharge: 2024-02-21 | Disposition: A | Discharge status: Home / Self Care | Payer: Medicare (Managed Care) | Source: Ambulatory Visit | Attending: Internal Medicine | Admitting: Internal Medicine

## 2024-02-21 ENCOUNTER — Encounter: Payer: Self-pay | Admitting: Internal Medicine

## 2024-02-21 DIAGNOSIS — R0789 Other chest pain: Secondary | ICD-10-CM | POA: Diagnosis not present

## 2024-02-21 LAB — NM PET CT CARDIAC PERFUSION MULTI W/ABSOLUTE BLOODFLOW
LV dias vol: 90 mL (ref 62–150)
LV sys vol: 32 mL (ref 4.2–5.8)
MBFR: 1.97
Nuc Rest EF: 64 %
Nuc Stress EF: 59 %
Peak HR: 81 {beats}/min
Rest HR: 61 {beats}/min
Rest MBF: 0.67 ml/g/min
Rest Nuclear Isotope Dose: 28.2 mCi
SDS: 14
SRS: 0
SSS: 14
ST Depression (mm): 0 mm
Stress MBF: 1.32 ml/g/min
Stress Nuclear Isotope Dose: 28.3 mCi

## 2024-02-21 MED ORDER — REGADENOSON 0.4 MG/5ML IV SOLN
0.4000 mg | Freq: Once | INTRAVENOUS | Status: AC
  Administered 2024-02-21: 0.4 mg via INTRAVENOUS

## 2024-02-21 MED ORDER — CAFFEINE CITRATE BASE COMPONENT 10 MG/ML IV SOLN
INTRAVENOUS | Status: AC
  Filled 2024-02-21: qty 3

## 2024-02-21 MED ORDER — REGADENOSON 0.4 MG/5ML IV SOLN
INTRAVENOUS | Status: AC
Start: 2024-02-21 — End: 2024-02-21
  Filled 2024-02-21: qty 5

## 2024-02-21 MED ORDER — RUBIDIUM RB82 GENERATOR (RUBYFILL)
28.3000 | PACK | Freq: Once | INTRAVENOUS | Status: AC
  Administered 2024-02-21: 28.3 via INTRAVENOUS

## 2024-02-21 MED ORDER — RUBIDIUM RB82 GENERATOR (RUBYFILL)
28.2200 | PACK | Freq: Once | INTRAVENOUS | Status: AC
  Administered 2024-02-21: 28.22 via INTRAVENOUS

## 2024-02-21 NOTE — Telephone Encounter (Signed)
 Patient with diagnosis of atrial fibrillation on Xarelto  for anticoagulation.    Procedure:  LEFT TOTAL KNEE ARTHROPLASTY   Date of Surgery:  Clearance TBD  (AUGUST)   CHA2DS2-VASc Score = 2   This indicates a 2.2% annual risk of stroke. The patient's score is based upon: CHF History: 0 HTN History: 1 Diabetes History: 0 Stroke History: 0 Vascular Disease History: 0 Age Score: 1 Gender Score: 0   CrCl 101 Platelet count 195  Patient has not had an Afib/aflutter ablation within the last 3 months or DCCV within the last 30 days  Per office protocol, patient can hold Xarelto  for 2 days prior to procedure.   Patient will need bridging with Lovenox (enoxaparin) around procedure.  **This guidance is not considered finalized until pre-operative APP has relayed final recommendations.**

## 2024-02-21 NOTE — Progress Notes (Signed)
 Pt. Tolerated lexi scan well.

## 2024-02-21 NOTE — Telephone Encounter (Signed)
 Tried calling patient to schedule televisit for preop clearance patient answered but call got disconnected tried calling patient again wasn't able to get through and couldn't leave vm will try again at a later time

## 2024-02-21 NOTE — Telephone Encounter (Signed)
   Name: Richard Walker  DOB: 1955/10/01  MRN: [id number]  Primary Cardiologist: None   Preoperative team, please contact this patient and set up a phone call appointment for further preoperative risk assessment. Please obtain consent and complete medication review. Thank you for your help.  I confirm that guidance regarding antiplatelet and oral anticoagulation therapy has been completed and, if necessary, noted below.  Per office protocol, patient can hold Xarelto  for 2 days prior to procedure.   Patient will need bridging with Lovenox (enoxaparin) around procedure.  I also confirmed the patient resides in the state of [place] . As per [facility] Medical Board telemedicine laws, the patient must reside in the state in which the provider is licensed.   [name], [name], NP 02/21/2024, 7:49 AM [facility]

## 2024-02-22 ENCOUNTER — Telehealth: Payer: Self-pay | Admitting: *Deleted

## 2024-02-22 ENCOUNTER — Ambulatory Visit: Payer: Self-pay | Admitting: Internal Medicine

## 2024-02-22 NOTE — Telephone Encounter (Signed)
 Pt has been scheduled tele preop appt 03/12/24. Pt states trying to have surgery early 03/2024 or sooner if possible.  Med rec and consent are done.     Patient Consent for Virtual Visit        Richard Walker has provided verbal consent on 02/22/2024 for a virtual visit (video or telephone).   CONSENT FOR VIRTUAL VISIT FOR:  Richard Walker  By participating in this virtual visit I agree to the following:  I hereby voluntarily request, consent and authorize [facility] and its employed or contracted physicians, physician assistants, nurse practitioners or other licensed health care professionals (the Practitioner), to provide me with telemedicine health care services (the "Services) as deemed necessary by the treating Practitioner. I acknowledge and consent to receive the Services by the Practitioner via telemedicine. I understand that the telemedicine visit will involve communicating with the Practitioner through live audiovisual communication technology and the disclosure of certain medical information by electronic transmission. I acknowledge that I have been given the opportunity to request an in-person assessment or other available alternative prior to the telemedicine visit and am voluntarily participating in the telemedicine visit.  I understand that I have the right to withhold or withdraw my consent to the use of telemedicine in the course of my care at any time, without affecting my right to future care or treatment, and that the Practitioner or I may terminate the telemedicine visit at any time. I understand that I have the right to inspect all information obtained and/or recorded in the course of the telemedicine visit and may receive copies of available information for a reasonable fee.  I understand that some of the potential risks of receiving the Services via telemedicine include:  Delay or interruption in medical evaluation due to technological equipment failure or  disruption; Information transmitted may not be sufficient (e.g. poor resolution of images) to allow for appropriate medical decision making by the Practitioner; and/or  In rare instances, security protocols could fail, causing a breach of personal health information.  Furthermore, I acknowledge that it is my responsibility to provide information about my medical history, conditions and care that is complete and accurate to the best of my ability. I acknowledge that Practitioner's advice, recommendations, and/or decision may be based on factors not within their control, such as incomplete or inaccurate data provided by me or distortions of diagnostic images or specimens that may result from electronic transmissions. I understand that the practice of medicine is not an exact science and that Practitioner makes no warranties or guarantees regarding treatment outcomes. I acknowledge that a copy of this consent can be made available to me via my patient portal [facility] MyChart), or I can request a printed copy by calling the office of [facility].    I understand that my insurance will be billed for this visit.   I have read or had this consent read to me. I understand the contents of this consent, which adequately explains the benefits and risks of the Services being provided via telemedicine.  I have been provided ample opportunity to ask questions regarding this consent and the Services and have had my questions answered to my satisfaction. I give my informed consent for the services to be provided through the use of telemedicine in my medical care

## 2024-02-22 NOTE — Telephone Encounter (Signed)
 Pt has been scheduled tele preop appt 03/12/24. Pt states trying to have surgery early 03/2024 or sooner if possible.  Med rec and consent are done.

## 2024-02-23 ENCOUNTER — Telehealth: Payer: Self-pay

## 2024-02-23 ENCOUNTER — Ambulatory Visit: Payer: Medicare (Managed Care) | Admitting: Surgical

## 2024-02-23 NOTE — Telephone Encounter (Signed)
 Multiple attempts made at reaching out to patient about getting patients appointment rescheduled without success. I sent mychart message.

## 2024-02-29 ENCOUNTER — Encounter: Payer: Self-pay | Admitting: Orthopedic Surgery

## 2024-02-29 ENCOUNTER — Ambulatory Visit (INDEPENDENT_AMBULATORY_CARE_PROVIDER_SITE_OTHER): Payer: Medicare (Managed Care) | Admitting: Orthopedic Surgery

## 2024-02-29 DIAGNOSIS — M25562 Pain in left knee: Secondary | ICD-10-CM

## 2024-02-29 DIAGNOSIS — M170 Bilateral primary osteoarthritis of knee: Secondary | ICD-10-CM

## 2024-02-29 DIAGNOSIS — M1711 Unilateral primary osteoarthritis, right knee: Secondary | ICD-10-CM | POA: Diagnosis not present

## 2024-02-29 DIAGNOSIS — M25561 Pain in right knee: Secondary | ICD-10-CM

## 2024-02-29 DIAGNOSIS — G8929 Other chronic pain: Secondary | ICD-10-CM | POA: Diagnosis not present

## 2024-02-29 MED ORDER — LIDOCAINE HCL 1 % IJ SOLN
5.0000 mL | INTRAMUSCULAR | Status: AC | PRN
  Administered 2024-02-29: 5 mL

## 2024-02-29 NOTE — Progress Notes (Signed)
   Procedure Note  Patient: Richard Walker             Date of Birth: September 15, 1955           MRN: [id number]             Visit Date: 02/29/2024  Procedures: Visit Diagnoses:  1. Bilateral chronic knee pain   2. Primary osteoarthritis of both knees     Large Joint Inj: R knee on 02/29/2024 4:44 PM Indications: pain, joint swelling and diagnostic evaluation Details: 18 G 1.5 in needle, superolateral approach  Arthrogram: No  Medications: 5 mL lidocaine  1 % Outcome: tolerated well, no immediate complications Procedure, treatment alternatives, risks and benefits explained, specific risks discussed. Consent was given by the patient. Immediately prior to procedure a time out was called to verify the correct patient, procedure, equipment, support staff and site/side marked as required. Patient was prepped and draped in the usual sterile fashion.

## 2024-03-12 ENCOUNTER — Encounter: Payer: Self-pay | Admitting: Nurse Practitioner

## 2024-03-12 ENCOUNTER — Ambulatory Visit: Payer: Medicare (Managed Care) | Attending: Internal Medicine | Admitting: Nurse Practitioner

## 2024-03-12 DIAGNOSIS — Z01810 Encounter for preprocedural cardiovascular examination: Secondary | ICD-10-CM | POA: Diagnosis not present

## 2024-03-12 NOTE — Progress Notes (Addendum)
 Virtual Visit via Telephone Note   Because of Richard Walker co-morbid illnesses, he is at least at moderate risk for complications without adequate follow up.  This format is felt to be most appropriate for this patient at this time.  Due to technical limitations with video connection (technology), today's appointment will be conducted as an audio only telehealth visit, and Richard Walker verbally agreed to proceed in this manner.   All issues noted in this document were discussed and addressed.  No physical exam could be performed with this format.  Evaluation Performed:  Preoperative cardiovascular risk assessment _____________   Date:  03/12/2024   Patient ID:  Richard Walker, Richard Walker [birth year], MRN [id number] Patient Location:  Home Provider location:   Office  Primary Care Provider:  Dorina Dallas RIGGERS Primary Cardiologist:  None  Chief Complaint / Patient Profile   68 y.o. y/o male with a h/o SVT s/p ablation, PAF on flecainide  and anticoagulation, HTN, obesity, OSA on CPAP who is pending left total knee arthroplasty with Dr. Addie on date TBD and presents today for telephonic preoperative cardiovascular risk assessment.  History of Present Illness    Richard Walker is a 68 y.o. male who presents via audio/video conferencing for a telehealth visit today.  Pt was last seen in cardiology clinic on 11/21/23 by Dr. Waddell.  At that time Richard Walker reported chest pain, non-exertional, however due to concerning risk factors, perfusion stress testing was ordered. Cardiac PET CT.  The patient is now pending procedure as outlined above. Since his last visit, he denies chest pain, shortness of breath, palpitations, diaphoresis, weakness, presyncope, syncope, orthopnea, and PND. He has knee swelling but no additional edema. He is somewhat limited by knee pain and can achieve > 4 METS albeit walking at a slow pace and occasionally stopping because of knee pain. I reviewed findings of PET CT with Dr.  Waddell and he advised patient may proceed to surgery without further testing.    Past Medical History    Past Medical History:  Diagnosis Date   DDD (degenerative disc disease), lumbar    L3/L4   Diverticulosis    GERD (gastroesophageal reflux disease)    Hyperlipidemia    Low sperm motility    Pre-diabetes    Sleep apnea    uses cpap    SVT (supraventricular tachycardia) [facility])    Past Surgical History:  Procedure Laterality Date   AMPUTATION Left 07/15/2018   Procedure: . REPAIR OF LEFT INDEX AND LONG FINGER BY FUSION /  REPAIR OF RING FINGER;  Surgeon: Sebastian Lenis, MD;  Location: [facility] OR;  Service: Orthopedics;  Laterality: Left;   BACK SURGERY     BLALOCK PROCEDURE     Ablation   CARDIAC CATHETERIZATION N/A 12/22/2015   Procedure: Left Heart Cath and Coronary Angiography;  Surgeon: Peter M Swaziland, MD;  Location: [facility] INVASIVE CV LAB;  Service: Cardiovascular;  Laterality: N/A;   CARDIAC ELECTROPHYSIOLOGY STUDY AND ABLATION     ELECTROPHYSIOLOGIC STUDY N/A 02/19/2016   Procedure: SVT Ablation;  Surgeon: Danelle LELON Waddell, MD;  Location: [facility] INVASIVE CV LAB;  Service: Cardiovascular;  Laterality: N/A;   HAND SURGERY     septal reconstruction  2017   TURBINATE REDUCTION Bilateral 07/2016   WISDOM TOOTH EXTRACTION      Allergies  Allergies  Allergen Reactions   Dust Mite Extract    Pork-Derived Products Swelling   Statins     Muscle aches   Tree  Extract Other (See Comments)    Intolerance     Home Medications    Prior to Admission medications   Medication Sig Start Date End Date Taking? Authorizing Provider  acetaminophen  (TYLENOL ) 325 MG tablet Take 2 tablets (650 mg total) by mouth every 6 (six) hours. 07/15/18   Sebastian Lenis, MD  albuterol  (PROAIR  HFA) 108 (90 Base) MCG/ACT inhaler Inhale 1-2 puffs into the lungs every 6 (six) hours as needed for wheezing or shortness of breath. 10/11/23   Kassie Acquanetta Bradley, MD  azelastine  (ASTELIN ) 0.1 % nasal spray 1-2 sprays  per nostril 2 times daily as needed. 11/23/23   Almarie Waddell NOVAK, NP  benzonatate  (TESSALON ) 200 MG capsule Take 1 capsule (200 mg total) by mouth 2 (two) times daily as needed for cough. 11/23/23   Almarie Waddell NOVAK, NP  Budeson-Glycopyrrol-Formoterol  (BREZTRI  AEROSPHERE) 160-9-4.8 MCG/ACT AERO Inhale 2 puffs into the lungs in the morning and at bedtime. 10/11/23   Kassie Acquanetta Bradley, MD  doxycycline  (VIBRA -TABS) 100 MG tablet Take 1 tablet (100 mg total) by mouth 2 (two) times daily. 11/29/23   Saguier, [place], PA-C  ezetimibe  (ZETIA ) 10 MG tablet Take 1 tablet (10 mg total) by mouth daily. 09/14/23   Saguier, [place], PA-C  famotidine-calcium  carbonate-magnesium  hydroxide (PEPCID COMPLETE) 10-800-165 MG chewable tablet Chew by mouth.    [provider]  fenofibrate  (TRICOR ) 145 MG tablet Take 1 tablet (145 mg total) by mouth daily. 09/14/23   Saguier, [place], PA-C  flecainide  (TAMBOCOR ) 150 MG tablet Take 150 mg by mouth 2 (two) times daily. 01/06/24   [provider]  guaiFENesin  (MUCINEX ) 600 MG 12 hr tablet Take 2 tablets (1,200 mg total) by mouth 2 (two) times daily. 11/23/23   Almarie Waddell NOVAK, NP  ibuprofen  (ADVIL ) 600 MG tablet Take 600 mg by mouth in the morning and at bedtime.    [provider]  losartan  (COZAAR ) 25 MG tablet Take 1 tablet (25 mg total) by mouth daily. 09/14/23   Saguier, [place], PA-C  Magnesium  250 MG TABS Take 1 tablet by mouth once a week.    [provider]  metFORMIN  (GLUCOPHAGE ) 500 MG tablet TAKE 1 TABLET(500 MG) BY MOUTH TWICE DAILY WITH A MEAL 09/14/23   Saguier, [place], PA-C  metoprolol  tartrate (LOPRESSOR ) 25 MG tablet Take one tablet by mouth twice a day.  May take one additional tablet by mouth as needed for breakthrough palpitations. 11/21/23   Waddell Danelle ORN, MD  pantoprazole  (PROTONIX ) 40 MG tablet Take 1 tablet (40 mg total) by mouth daily. 10/21/23   Saguier, [place], PA-C  PRESCRIPTION MEDICATION Inhale into the lungs at bedtime. CPAP     [provider]  rivaroxaban  (XARELTO ) 20 MG TABS tablet Take 1 tablet (20 mg total) by mouth daily with supper. 11/21/23   Waddell Danelle ORN, MD  sildenafil  (VIAGRA ) 100 MG tablet Take 1 tablet (100 mg total) by mouth daily as needed for erectile dysfunction. 11/21/23   Waddell Danelle ORN, MD  Spacer/Aero-Holding Chambers (AEROCHAMBER MV) inhaler Use as instructed 10/11/23   Kassie Acquanetta Bradley, MD  tirzepatide  (ZEPBOUND ) 2.5 MG/0.5ML Pen Inject 2.5 mg into the skin once a week. 01/12/24   Francyne Romano, MD  tirzepatide  (ZEPBOUND ) 5 MG/0.5ML Pen Inject 5 mg into the skin once a week. Patient not taking: Reported on 02/22/2024 02/14/24   Francyne Romano, MD  Vitamin D , Ergocalciferol , (DRISDOL ) 1.25 MG (50000 UNIT) CAPS capsule Take 1 capsule (50,000 Units total) by mouth every 7 (seven) days.  03/10/23   Saguier, Dallas, PA-C  zonisamide  (ZONEGRAN ) 25 MG capsule Take 1 capsule (25 mg total) by mouth daily for 3 days, THEN 1 capsule (25 mg total) every other day for 6 days. 02/14/24 02/24/24  Francyne Romano, MD    Physical Exam    Vital Signs:  Richard Walker does not have vital signs available for review today.  Given telephonic nature of communication, physical exam is limited. AAOx3. NAD. Normal affect.  Speech and respirations are unlabored.  Accessory Clinical Findings    None  Assessment & Plan    1.  Preoperative Cardiovascular Risk Assessment: According to the Revised Cardiac Risk Index (RCRI), his Perioperative Risk of Major Cardiac Event is (%): 0.9. His Functional Capacity in METs is: 4.7 according to the Duke Activity Status Index (DASI). He underwent cardiac PET CT on 02/21/24 that was intermediate risk, reviewed with primary cardiologist Dr. Waddell who advised that he may proceed without further testing.   Waddell Danelle ORN, MD  Addie Cordella Hamilton, MD; Percy Rosaline HERO, NP Dr. Addie, I have been asked to give my input regarding Mr. Slayton who you plan knee surgery on in the  next few weeks. He had a negative heart cath in 2017 and a low risk stress test in 2023. He is an acceptable surgical risk and no additional testing is required. Danelle Waddell  The patient was advised that if he develops new symptoms prior to surgery to contact our office to arrange for a follow-up visit, and he verbalized understanding.  Per office protocol, patient can hold Xarelto  for 2 days prior to procedure.   Patient will need bridging with Lovenox (enoxaparin) around procedure.  A copy of this note will be routed to requesting surgeon.  Time:   Today, I have spent 10 minutes with the patient with telehealth technology discussing medical history, symptoms, and management plan.     Rosaline EMERSON Percy, NP-C  03/12/2024, 3:22 PM [address] [place], [place] [address] Office [phone] Fax [phone]

## 2024-03-13 ENCOUNTER — Ambulatory Visit (INDEPENDENT_AMBULATORY_CARE_PROVIDER_SITE_OTHER): Payer: Medicare (Managed Care) | Admitting: Internal Medicine

## 2024-03-13 ENCOUNTER — Other Ambulatory Visit (HOSPITAL_BASED_OUTPATIENT_CLINIC_OR_DEPARTMENT_OTHER): Payer: Self-pay

## 2024-03-13 ENCOUNTER — Encounter (INDEPENDENT_AMBULATORY_CARE_PROVIDER_SITE_OTHER): Payer: Self-pay | Admitting: Internal Medicine

## 2024-03-13 VITALS — BP 107/74 | HR 62 | Temp 97.9°F | Ht 68.0 in | Wt 242.0 lb

## 2024-03-13 DIAGNOSIS — K5903 Drug induced constipation: Secondary | ICD-10-CM | POA: Diagnosis not present

## 2024-03-13 DIAGNOSIS — K760 Fatty (change of) liver, not elsewhere classified: Secondary | ICD-10-CM

## 2024-03-13 DIAGNOSIS — E66812 Obesity, class 2: Secondary | ICD-10-CM

## 2024-03-13 DIAGNOSIS — R7303 Prediabetes: Secondary | ICD-10-CM

## 2024-03-13 DIAGNOSIS — G4733 Obstructive sleep apnea (adult) (pediatric): Secondary | ICD-10-CM | POA: Diagnosis not present

## 2024-03-13 DIAGNOSIS — Z6835 Body mass index (BMI) 35.0-35.9, adult: Secondary | ICD-10-CM | POA: Diagnosis not present

## 2024-03-13 MED ORDER — TIRZEPATIDE-WEIGHT MANAGEMENT 5 MG/0.5ML ~~LOC~~ SOAJ
5.0000 mg | SUBCUTANEOUS | 0 refills | Status: DC
Start: 2024-03-13 — End: 2024-04-11
  Filled 2024-03-13: qty 2, 28d supply, fill #0

## 2024-03-13 NOTE — Assessment & Plan Note (Signed)
 Reviewed sleep study from July 2022 he had an AHI of 50 with oxygen desaturations he is currently on PAP therapy but still has an elevated Epworth around 13 despite adjustments in pressures.  I feel that patient will benefit from weight loss aided by GLP-1 to reduce AHI and symptoms related to sleep disordered breathing.  Continue Zepbound  at current dose

## 2024-03-13 NOTE — Assessment & Plan Note (Signed)
 He has hepatic steatosis on CT scan completed in January of this year.  Fibrosis 4 Score = 1.3  He does not drink alcohol and is not on steatogenic medication.  Losing 15% of body weight may improve condition.  He is also working on reducing saturated fats simple and processed sugars in his diet and is engaging in physical activity.  Continue Zepbound  5 mg once a week

## 2024-03-13 NOTE — Assessment & Plan Note (Signed)
 Undergoing medical weight management with a 9% total body weight loss (22 pounds). Currently on Zepbound  5 mg weekly since Jan 12, 2024. Adheres to nutrition plan 50-60% of the time, consuming more whole foods and maintaining hydration.  Bioimpedance analysis shows 33% body fat and visceral fat rating of 21, with a goal of less than 10. Experiencing constipation, a side effect of Zepbound , which slows digestion and contributes to a feeling of fullness and reduced food intake. Increasing the dose could exacerbate constipation, so the current dose is maintained. - Continue Zepbound  5 mg once a week - Provide list of high fiber foods - Recommend Miralax 1-2 servings per day - Recommend Dulcolax (bisacodyl) 1-2 tablets per day - Consider glycerin or Dulcolax suppository if needed - Encourage increased intake of vegetables and leafy greens - Advise 90 ounces of water intake daily

## 2024-03-13 NOTE — Assessment & Plan Note (Signed)
 Based on most recent hemoglobin A1c of 6.0.  He is currently on metformin  XR 500 mg twice a day and Zepbound  and will continue medication.  He will continue to take B12 supplementation.

## 2024-03-13 NOTE — Progress Notes (Signed)
 Office: [phone]  /  Fax: [phone]  Weight Summary and Body Composition Analysis (BIA)  Vitals Temp: 97.9 F (36.6 C) BP: 107/74 Pulse Rate: 62 SpO2: 97 %   Anthropometric Measurements Height: 5' 8 (1.727 m) Weight: 242 lb (109.8 kg) BMI (Calculated): 36.8 Weight at Last Visit: 247 lb Weight Lost Since Last Visit: 5 lb Weight Gained Since Last Visit: 0 lb Starting Weight: 264 lb Total Weight Loss (lbs): 22 lb (9.979 kg) Peak Weight: 270 lb   Body Composition  Body Fat %: 33.2 % Fat Mass (lbs): 80.4 lbs Muscle Mass (lbs): 154 lbs Total Body Water (lbs): 111.2 lbs Visceral Fat Rating : 21    RMR: 2405  Today's Visit #: 6  Starting Date: 11/03/23   Subjective   Chief Complaint: Obesity  Interval History Discussed the use of AI scribe software for clinical note transcription with the patient, who gave verbal consent to proceed.  History of Present Illness   BECKHEM ISADORE is a 68 year old male with sleep apnea, hypertension, atrial fibrillation, MASLD, and prediabetes who presents for medical weight management.  He is on Zepbound  5 mg once a week since Jan 12, 2024, for weight management. Since the last visit, he has lost 5 pounds, totaling a 22-pound weight loss, which is 9% of his total body weight. He has difficulty understanding calorie targets due to language and cultural reasons but follows his nutrition plan 50-60% of the time. He does not track his intake but eats more whole foods, feels he is getting the recommended amount of protein, maintains adequate hydration, and acknowledges skipping meals. He is not exercising due to orthopedic problems.  He experiences constipation and dry mouth as side effects of Zepbound . Constipation is particularly bothersome, with bowel movements occurring every 2-3 days, requiring significant straining. He has tried dietary adjustments, including dried plums and pills, which help but do not fully resolve the issue. No  nausea or vomiting.  His body fat percentage has decreased from 37.5% to 33%, with a visceral fat rating of 21, down from 22.  He is currently taking fenofibrate  and losartan . He has increased his protein intake with shakes but feels guilty for not consuming as many salads as initially planned.       Challenges affecting patient progress: low volume of physical activity at present  and orthopedic problems, medical conditions or chronic pain affecting mobility.    Pharmacotherapy for weight management: He is currently taking Zepbound  with adequate clinical response  and without side effects..   Assessment and Plan   Treatment Plan For Obesity:  Recommended Dietary Goals  Orlie is currently in the action stage of change. As such, his goal is to continue weight management plan. He has agreed to: continue current plan  Behavioral Health and Counseling  We discussed the following behavioral modification strategies today: continue to work on maintaining a reduced calorie state, getting the recommended amount of protein, incorporating whole foods, making healthy choices, staying well hydrated and practicing mindfulness when eating..  Additional education and resources provided today: None  Recommended Physical Activity Goals  Mikai has been advised to work up to 150 minutes of moderate intensity aerobic activity a week and strengthening exercises 2-3 times per week for cardiovascular health, weight loss maintenance and preservation of muscle mass.   He has agreed to :  Think about enjoyable ways to increase daily physical activity and overcoming barriers to exercise and Increase physical activity in their day and reduce sedentary  time (increase NEAT).  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 Currently on Zepbound  5 mg weekly since Jan 12, 2024. Adheres to nutrition plan 50-60% of the time, consuming more whole foods and maintaining hydration.  Bioimpedance analysis shows 33% body fat and visceral fat rating of 21, with a goal of less than 10. Experiencing constipation, a side effect of Zepbound , which slows digestion and contributes to a feeling of fullness and reduced food intake. Increasing the dose could exacerbate constipation, so the current dose is maintained. - Continue Zepbound  5 mg once a week - Provide list of high fiber foods - Recommend Miralax 1-2 servings per day - Recommend Dulcolax (bisacodyl) 1-2 tablets per day - Consider glycerin or Dulcolax suppository if needed - Encourage increased intake of vegetables and leafy greens - Advise 90 ounces of water intake daily OSA on CPAP Reviewed sleep study from July 2022 he had an AHI of 50 with oxygen desaturations he is currently on PAP therapy but still has an elevated Epworth around 13 despite adjustments in pressures.  I feel that patient will benefit from weight loss aided by GLP-1 to reduce AHI and symptoms related to sleep disordered breathing.  Continue Zepbound  at current dose Prediabetes Based on most recent hemoglobin A1c of 6.0.  He is currently on metformin  XR 500 mg twice a day and Zepbound  and will continue medication.  He will continue to take B12 supplementation.  Metabolic dysfunction-associated steatotic liver disease (MASLD) He has hepatic steatosis on CT scan completed in January of this year.  Fibrosis 4 Score = 1.3  He does not drink alcohol and is  not on steatogenic medication.  Losing 15% of body weight may improve condition.  He is also working on reducing saturated fats simple and processed sugars in his diet and is engaging in physical activity.  Continue Zepbound  5 mg once a week  Drug-induced constipation Constipation likely due to Zepbound , causing bowel movements every 2-3 days with straining and hard stools. Dietary changes and over-the-counter remedies have had limited success. Advised on dietary fiber and hydration to alleviate symptoms. Miralax and Dulcolax recommended to manage symptoms, with caution to avoid diarrhea. - Recommend Miralax 1-2 servings per day - Recommend Dulcolax (bisacodyl) 1-2 tablets per day - Consider glycerin or Dulcolax suppository if needed - Encourage increased intake of vegetables and leafy greens - Advise 90 ounces of water intake daily -No further increases in Zepbound      - Inform surgical team of Zepbound  use and potential need to hold medication  General Health Maintenance Advised to maintain a healthy diet, inc rease physical activity as tolerated, and ensure adequate hydration. Emphasis on reducing visceral fat to improve overall health outcomes. Encouraged to increase fiber intake to aid in weight management and bowel regularity. - Advise on healthy diet with increased fiber intake - Ensure adequate hydration  Follow-up Scheduled for knee surgery on March 27, 2024, with a follow-up appointment in four weeks to assess progress and adjust treatment as  needed. Coordination with surgical team regarding medication management around surgery is necessary. - Schedule follow-up appointment in four weeks - Coordinate with surgical team regarding medication management around surgery        Objective   Physical Exam:  Blood pressure 107/74, pulse 62, temperature 97.9 F (36.6 C), height 5' 8 (1.727 m), weight 242 lb (109.8 kg), SpO2 97%. Body mass index is 36.8 kg/m.  General: He  is overweight, cooperative, alert, well developed, and in no acute distress. PSYCH: Has normal mood, affect and thought process.   HEENT: EOMI, sclerae are anicteric. Lungs: Normal breathing effort, no conversational dyspnea. Extremities: No edema.  Neurologic: No gross sensory or motor deficits. No tremors or fasciculations noted.    Diagnostic Data Reviewed:  BMET    Component Value Date/Time   NA 138 01/18/2024 0935   NA 138 11/03/2023 1117   K 4.6 01/18/2024 0935   CL 101 01/18/2024 0935   CO2 26 01/18/2024 0935   GLUCOSE 111 (H) 01/18/2024 0935   BUN 23 01/18/2024 0935   BUN 19 11/03/2023 1117   CREATININE 1.12 01/18/2024 0935   CREATININE 1.18 02/12/2016 1508   CALCIUM  9.7 01/18/2024 0935   GFRNONAA >60 09/22/2023 1405   GFRNONAA >89 03/04/2014 1456   GFRAA >60 07/15/2018 1324   GFRAA >89 03/04/2014 1456   Lab Results  Component Value Date   HGBA1C 5.9 01/18/2024   HGBA1C 5.7 12/24/2015   Lab Results  Component Value Date   INSULIN  25.3 (H) 11/03/2023   Lab Results  Component Value Date   TSH 1.81 01/20/2024   CBC    Component Value Date/Time   WBC 6.4 01/20/2024 1057   RBC 5.49 01/20/2024 1057   HGB 15.5 01/20/2024 1057   HCT 46.5 01/20/2024 1057   PLT 195.0 01/20/2024 1057   MCV 84.7 01/20/2024 1057   MCH 27.8 09/22/2023 1405   MCHC 33.4 01/20/2024 1057   RDW 14.0 01/20/2024 1057   Iron Studies    Component Value Date/Time   IRON 69 06/28/2018 1611   Lipid Panel     Component Value Date/Time   CHOL 209 (H) 01/18/2024 0935   TRIG 212.0 (H) 01/18/2024 0935   HDL 42.40 01/18/2024 0935   CHOLHDL 5 01/18/2024 0935   VLDL 42.4 (H) 01/18/2024 0935   LDLCALC 124 (H) 01/18/2024 0935   LDLDIRECT 150.0 03/10/2023 0951   Hepatic Function Panel     Component Value Date/Time   PROT 6.9 01/18/2024 0935   PROT 6.9 11/03/2023 1117   ALBUMIN 4.3 01/18/2024 0935   ALBUMIN 4.3 11/03/2023 1117   AST 16 01/18/2024 0935   ALT 23 01/18/2024 0935    ALKPHOS 83 01/18/2024 0935   BILITOT 0.4 01/18/2024 0935   BILITOT 0.3 11/03/2023 1117   BILIDIR 0.1 01/06/2017 1600      Component Value Date/Time   TSH 1.81 01/20/2024 1057   Nutritional Lab Results  Component Value Date   VD25OH 55.49 01/20/2024   VD25OH 35.78 09/12/2023   VD25OH 24.68 (L) 03/10/2023    Medications: Outpatient Encounter Medications as of 03/13/2024  Medication Sig   acetaminophen  (TYLENOL ) 325 MG tablet Take 2 tablets (650 mg total) by mouth every 6 (six) hours.   albuterol  (PROAIR  HFA) 108 (90 Base) MCG/ACT inhaler Inhale 1-2 puffs into the lungs every 6 (six) hours as needed for wheezing or shortness of breath.   azelastine  (ASTELIN ) 0.1 % nasal spray 1-2 sprays per nostril 2 times daily as needed.  benzonatate  (TESSALON ) 200 MG capsule Take 1 capsule (200 mg total) by mouth 2 (two) times daily as needed for cough.   Budeson-Glycopyrrol-Formoterol  (BREZTRI  AEROSPHERE) 160-9-4.8 MCG/ACT AERO Inhale 2 puffs into the lungs in the morning and at bedtime.   doxycycline  (VIBRA -TABS) 100 MG tablet Take 1 tablet (100 mg total) by mouth 2 (two) times daily.   ezetimibe  (ZETIA ) 10 MG tablet Take 1 tablet (10 mg total) by mouth daily.   famotidine-calcium  carbonate-magnesium  hydroxide (PEPCID COMPLETE) 10-800-165 MG chewable tablet Chew by mouth.   fenofibrate  (TRICOR ) 145 MG tablet Take 1 tablet (145 mg total) by mouth daily.   flecainide  (TAMBOCOR ) 150 MG tablet Take 150 mg by mouth 2 (two) times daily.   guaiFENesin  (MUCINEX ) 600 MG 12 hr tablet Take 2 tablets (1,200 mg total) by mouth 2 (two) times daily.   ibuprofen  (ADVIL ) 600 MG tablet Take 600 mg by mouth in the morning and at bedtime.   losartan  (COZAAR ) 25 MG tablet Take 1 tablet (25 mg total) by mouth daily.   Magnesium  250 MG TABS Take 1 tablet by mouth once a week.   metFORMIN  (GLUCOPHAGE ) 500 MG tablet TAKE 1 TABLET(500 MG) BY MOUTH TWICE DAILY WITH A MEAL   metoprolol  tartrate (LOPRESSOR ) 25 MG tablet Take  one tablet by mouth twice a day.  May take one additional tablet by mouth as needed for breakthrough palpitations.   pantoprazole  (PROTONIX ) 40 MG tablet Take 1 tablet (40 mg total) by mouth daily.   PRESCRIPTION MEDICATION Inhale into the lungs at bedtime. CPAP   rivaroxaban  (XARELTO ) 20 MG TABS tablet Take 1 tablet (20 mg total) by mouth daily with supper.   sildenafil  (VIAGRA ) 100 MG tablet Take 1 tablet (100 mg total) by mouth daily as needed for erectile dysfunction.   Spacer/Aero-Holding Chambers (AEROCHAMBER MV) inhaler Use as instructed   Vitamin D , Ergocalciferol , (DRISDOL ) 1.25 MG (50000 UNIT) CAPS capsule Take 1 capsule (50,000 Units total) by mouth every 7 (seven) days.   [DISCONTINUED] tirzepatide  (ZEPBOUND ) 2.5 MG/0.5ML Pen Inject 2.5 mg into the skin once a week.   [DISCONTINUED] tirzepatide  (ZEPBOUND ) 5 MG/0.5ML Pen Inject 5 mg into the skin once a week.   [DISCONTINUED] zonisamide  (ZONEGRAN ) 25 MG capsule Take 1 capsule (25 mg total) by mouth daily for 3 days, THEN 1 capsule (25 mg total) every other day for 6 days.   tirzepatide  (ZEPBOUND ) 5 MG/0.5ML Pen Inject 5 mg into the skin once a week.   No facility-administered encounter medications on file as of 03/13/2024.     Follow-Up   Return in about 4 weeks (around 04/10/2024) for For Weight Mangement with Dr. Francyne.SABRA He was informed of the importance of frequent follow up visits to maximize his success with intensive lifestyle modifications for his multiple health conditions.  Attestation Statement   Reviewed by clinician on day of visit: allergies, medications, problem list, medical history, surgical history, family history, social history, and previous encounter notes.     Lucas Francyne, MD

## 2024-03-13 NOTE — Assessment & Plan Note (Signed)
 Constipation likely due to Zepbound , causing bowel movements every 2-3 days with straining and hard stools. Dietary changes and over-the-counter remedies have had limited success. Advised on dietary fiber and hydration to alleviate symptoms. Miralax and Dulcolax recommended to manage symptoms, with caution to avoid diarrhea. - Recommend Miralax 1-2 servings per day - Recommend Dulcolax (bisacodyl) 1-2 tablets per day - Consider glycerin or Dulcolax suppository if needed - Encourage increased intake of vegetables and leafy greens - Advise 90 ounces of water intake daily -No further increases in Zepbound 

## 2024-03-16 ENCOUNTER — Ambulatory Visit: Payer: Medicare (Managed Care) | Admitting: Surgical

## 2024-03-19 ENCOUNTER — Encounter (HOSPITAL_COMMUNITY): Payer: Self-pay

## 2024-03-19 NOTE — Pre-Procedure Instructions (Signed)
 Surgical Instructions   Your procedure is scheduled on March 27, 2024. Report to [facility] Main Entrance A at 10:40 A.M., then check in with the Admitting office. Any questions or running late day of surgery: call [phone]  Questions prior to your surgery date: call [phone], Monday-Friday, 8am-4pm. If you experience any cold or flu symptoms such as cough, fever, chills, shortness of breath, etc. between now and your scheduled surgery, please notify us  at the above number.     Remember:  Do not eat after midnight the night before your surgery  You may drink clear liquids until 9:40 AM the morning of your surgery.   Clear liquids allowed are: Water, Non-Citrus Juices (without pulp), Carbonated Beverages, Clear Tea (no milk, honey, etc.), Black Coffee Only (NO MILK, CREAM OR POWDERED CREAMER of any kind), and Gatorade.  Patient Instructions  The night before surgery:  No food after midnight. ONLY clear liquids after midnight  The day of surgery (if you do NOT have diabetes):  Drink ONE (1) Pre-Surgery Clear Ensure by 9:40 AM the morning of surgery. Drink in one sitting. Do not sip.  This drink was given to you during your hospital  pre-op appointment visit.  Nothing else to drink after completing the  Pre-Surgery Clear Ensure.         If you have questions, please contact your surgeon's office.    Take these medicines the morning of surgery with A SIP OF WATER: Budeson-Glycopyrrol-Formoterol  (BREZTRI  AEROSPHERE) inhaler ezetimibe  (ZETIA )  fenofibrate  (TRICOR )  flecainide  (TAMBOCOR )  metoprolol  tartrate (LOPRESSOR )  pantoprazole  (PROTONIX )    May take these medicines IF NEEDED: albuterol  (PROAIR  HFA) inhaler - please bring inhaler with you morning of surgery azelastine  (ASTELIN ) nasal spray   Follow your surgeon's instructions on when to stop rivaroxaban  (XARELTO ).  If no instructions were given by your surgeon then you will need to call the office to get those  instructions.    DO NOT take your metFORMIN  (GLUCOPHAGE ) morning of surgery.  STOP taking tirzepatide  (ZEPBOUND ) one week prior to surgery. DO NOT take any doses after July 28th.   One week prior to surgery, STOP taking any Aspirin  (unless otherwise instructed by your surgeon) Aleve, Naproxen, Ibuprofen , Motrin , Advil , Goody's, BC's, all herbal medications, fish oil, and non-prescription vitamins.                     Do NOT Smoke (Tobacco/Vaping) for 24 hours prior to your procedure.  If you use a CPAP at night, you may bring your mask/headgear for your overnight stay.   You will be asked to remove any contacts, glasses, piercing's, hearing aid's, dentures/partials prior to surgery. Please bring cases for these items if needed.    Patients discharged the day of surgery will not be allowed to drive home, and someone needs to stay with them for 24 hours.  SURGICAL WAITING ROOM VISITATION Patients may have no more than 2 support people in the waiting area - these visitors may rotate.   Pre-op nurse will coordinate an appropriate time for 1 ADULT support person, who may not rotate, to accompany patient in pre-op.  Children under the age of 62 must have an adult with them who is not the patient and must remain in the main waiting area with an adult.  If the patient needs to stay at the hospital during part of their recovery, the visitor guidelines for inpatient rooms apply.  Please refer to the [facility] website for the visitor  guidelines for any additional information.   If you received a COVID test during your pre-op visit  it is requested that you wear a mask when out in public, stay away from anyone that may not be feeling well and notify your surgeon if you develop symptoms. If you have been in contact with anyone that has tested positive in the last 10 days please notify you surgeon.      Pre-operative 5 CHG Bathing Instructions   You can play a key role in reducing the risk of  infection after surgery. Your skin needs to be as free of germs as possible. You can reduce the number of germs on your skin by washing with CHG (chlorhexidine gluconate) soap before surgery. CHG is an antiseptic soap that kills germs and continues to kill germs even after washing.   DO NOT use if you have an allergy to chlorhexidine/CHG or antibacterial soaps. If your skin becomes reddened or irritated, stop using the CHG and notify one of our RNs at [phone].   Please shower with the CHG soap starting 4 days before surgery using the following schedule:     Please keep in mind the following:  DO NOT shave, including legs and underarms, starting the day of your first shower.   You may shave your face at any point before/day of surgery.  Place clean sheets on your bed the day you start using CHG soap. Use a clean washcloth (not used since being washed) for each shower. DO NOT sleep with pets once you start using the CHG.   CHG Shower Instructions:  Wash your face and private area with normal soap. If you choose to wash your hair, wash first with your normal shampoo.  After you use shampoo/soap, rinse your hair and body thoroughly to remove shampoo/soap residue.  Turn the water OFF and apply about 3 tablespoons (45 ml) of CHG soap to a CLEAN washcloth.  Apply CHG soap ONLY FROM YOUR NECK DOWN TO YOUR TOES (washing for 3-5 minutes)  DO NOT use CHG soap on face, private areas, open wounds, or sores.  Pay special attention to the area where your surgery is being performed.  If you are having back surgery, having someone wash your back for you may be helpful. Wait 2 minutes after CHG soap is applied, then you may rinse off the CHG soap.  Pat dry with a clean towel  Put on clean clothes/pajamas   If you choose to wear lotion, please use ONLY the CHG-compatible lotions that are listed below.  Additional instructions for the day of surgery: DO NOT APPLY any lotions, deodorants, cologne, or  perfumes.   Do not bring valuables to the hospital. [facility] is not responsible for any belongings/valuables. Do not wear nail polish, gel polish, artificial nails, or any other type of covering on natural nails (fingers and toes) Do not wear jewelry or makeup Put on clean/comfortable clothes.  Please brush your teeth.  Ask your nurse before applying any prescription medications to the skin.     CHG Compatible Lotions   Aveeno Moisturizing lotion  Cetaphil Moisturizing Cream  Cetaphil Moisturizing Lotion  Clairol Herbal Essence Moisturizing Lotion, Dry Skin  Clairol Herbal Essence Moisturizing Lotion, Extra Dry Skin  Clairol Herbal Essence Moisturizing Lotion, Normal Skin  Curel Age Defying Therapeutic Moisturizing Lotion with Alpha Hydroxy  Curel Extreme Care Body Lotion  Curel Soothing Hands Moisturizing Hand Lotion  Curel Therapeutic Moisturizing Cream, Fragrance-Free  Curel Therapeutic Moisturizing Lotion, Fragrance-Free  Curel Therapeutic Moisturizing Lotion, Original Formula  Eucerin Daily Replenishing Lotion  Eucerin Dry Skin Therapy Plus Alpha Hydroxy Crme  Eucerin Dry Skin Therapy Plus Alpha Hydroxy Lotion  Eucerin Original Crme  Eucerin Original Lotion  Eucerin Plus Crme Eucerin Plus Lotion  Eucerin TriLipid Replenishing Lotion  Keri Anti-Bacterial Hand Lotion  Keri Deep Conditioning Original Lotion Dry Skin Formula Softly Scented  Keri Deep Conditioning Original Lotion, Fragrance Free Sensitive Skin Formula  Keri Lotion Fast Absorbing Fragrance Free Sensitive Skin Formula  Keri Lotion Fast Absorbing Softly Scented Dry Skin Formula  Keri Original Lotion  Keri Skin Renewal Lotion Keri Silky Smooth Lotion  Keri Silky Smooth Sensitive Skin Lotion  Nivea Body Creamy Conditioning Oil  Nivea Body Extra Enriched Lotion  Nivea Body Original Lotion  Nivea Body Sheer Moisturizing Lotion Nivea Crme  Nivea Skin Firming Lotion  NutraDerm 30 Skin Lotion  NutraDerm  Skin Lotion  NutraDerm Therapeutic Skin Cream  NutraDerm Therapeutic Skin Lotion  ProShield Protective Hand Cream  Provon moisturizing lotion  Please read over the following fact sheets that you were given.

## 2024-03-20 ENCOUNTER — Other Ambulatory Visit: Payer: Self-pay

## 2024-03-20 ENCOUNTER — Encounter (HOSPITAL_COMMUNITY): Payer: Self-pay

## 2024-03-20 ENCOUNTER — Inpatient Hospital Stay (HOSPITAL_COMMUNITY)
Admission: RE | Admit: 2024-03-20 | Discharge: 2024-03-20 | Disposition: A | Discharge status: Home / Self Care | Payer: Medicare (Managed Care) | Source: Ambulatory Visit | Attending: Orthopedic Surgery

## 2024-03-20 VITALS — BP 130/89 | HR 65 | Temp 98.0°F | Resp 18 | Ht 68.0 in | Wt 243.0 lb

## 2024-03-20 DIAGNOSIS — I252 Old myocardial infarction: Secondary | ICD-10-CM | POA: Insufficient documentation

## 2024-03-20 DIAGNOSIS — Z7901 Long term (current) use of anticoagulants: Secondary | ICD-10-CM | POA: Diagnosis not present

## 2024-03-20 DIAGNOSIS — K5730 Diverticulosis of large intestine without perforation or abscess without bleeding: Secondary | ICD-10-CM | POA: Diagnosis not present

## 2024-03-20 DIAGNOSIS — R7303 Prediabetes: Secondary | ICD-10-CM | POA: Insufficient documentation

## 2024-03-20 DIAGNOSIS — Z01818 Encounter for other preprocedural examination: Secondary | ICD-10-CM | POA: Diagnosis present

## 2024-03-20 DIAGNOSIS — K219 Gastro-esophageal reflux disease without esophagitis: Secondary | ICD-10-CM | POA: Diagnosis not present

## 2024-03-20 DIAGNOSIS — E785 Hyperlipidemia, unspecified: Secondary | ICD-10-CM | POA: Diagnosis not present

## 2024-03-20 DIAGNOSIS — I2510 Atherosclerotic heart disease of native coronary artery without angina pectoris: Secondary | ICD-10-CM | POA: Diagnosis not present

## 2024-03-20 DIAGNOSIS — G4733 Obstructive sleep apnea (adult) (pediatric): Secondary | ICD-10-CM | POA: Diagnosis not present

## 2024-03-20 DIAGNOSIS — Z01812 Encounter for preprocedural laboratory examination: Secondary | ICD-10-CM | POA: Diagnosis not present

## 2024-03-20 DIAGNOSIS — I480 Paroxysmal atrial fibrillation: Secondary | ICD-10-CM | POA: Diagnosis not present

## 2024-03-20 DIAGNOSIS — Z87891 Personal history of nicotine dependence: Secondary | ICD-10-CM | POA: Diagnosis not present

## 2024-03-20 DIAGNOSIS — K7581 Nonalcoholic steatohepatitis (NASH): Secondary | ICD-10-CM | POA: Insufficient documentation

## 2024-03-20 DIAGNOSIS — Z9889 Other specified postprocedural states: Secondary | ICD-10-CM | POA: Diagnosis not present

## 2024-03-20 DIAGNOSIS — M1712 Unilateral primary osteoarthritis, left knee: Secondary | ICD-10-CM | POA: Diagnosis not present

## 2024-03-20 DIAGNOSIS — I471 Supraventricular tachycardia, unspecified: Secondary | ICD-10-CM | POA: Diagnosis not present

## 2024-03-20 DIAGNOSIS — J45909 Unspecified asthma, uncomplicated: Secondary | ICD-10-CM | POA: Diagnosis not present

## 2024-03-20 HISTORY — DX: Cardiac arrhythmia, unspecified: I49.9

## 2024-03-20 HISTORY — DX: Acute myocardial infarction, unspecified: I21.9

## 2024-03-20 HISTORY — DX: Unspecified osteoarthritis, unspecified site: M19.90

## 2024-03-20 LAB — URINALYSIS, W/ REFLEX TO CULTURE (INFECTION SUSPECTED)
Bilirubin Urine: NEGATIVE
Glucose, UA: NEGATIVE mg/dL
Ketones, ur: NEGATIVE mg/dL
Leukocytes,Ua: NEGATIVE
Nitrite: NEGATIVE
Protein, ur: NEGATIVE mg/dL
Specific Gravity, Urine: 1.016 (ref 1.005–1.030)
pH: 5 (ref 5.0–8.0)

## 2024-03-20 LAB — CBC
HCT: 48.7 % (ref 39.0–52.0)
Hemoglobin: 16.2 g/dL (ref 13.0–17.0)
MCH: 28.6 pg (ref 26.0–34.0)
MCHC: 33.3 g/dL (ref 30.0–36.0)
MCV: 85.9 fL (ref 80.0–100.0)
Platelets: 237 K/uL (ref 150–400)
RBC: 5.67 MIL/uL (ref 4.22–5.81)
RDW: 13.2 % (ref 11.5–15.5)
WBC: 6.7 K/uL (ref 4.0–10.5)
nRBC: 0 % (ref 0.0–0.2)

## 2024-03-20 LAB — BASIC METABOLIC PANEL WITH GFR
Anion gap: 9 (ref 5–15)
BUN: 15 mg/dL (ref 8–23)
CO2: 23 mmol/L (ref 22–32)
Calcium: 9.7 mg/dL (ref 8.9–10.3)
Chloride: 104 mmol/L (ref 98–111)
Creatinine, Ser: 1.06 mg/dL (ref 0.61–1.24)
GFR, Estimated: >60 mL/min (ref >60)
Glucose, Bld: 98 mg/dL (ref 70–99)
Potassium: 4.2 mmol/L (ref 3.5–5.1)
Sodium: 136 mmol/L (ref 135–145)

## 2024-03-20 LAB — SURGICAL PCR SCREEN
MRSA, PCR: NEGATIVE
Staphylococcus aureus: NEGATIVE

## 2024-03-20 LAB — GLUCOSE, CAPILLARY: Glucose-Capillary: 110 mg/dL — ABNORMAL HIGH (ref 70–99)

## 2024-03-20 NOTE — Progress Notes (Signed)
 Patient declined to sign refusal for blood, states I am ok to receive blood if I need it

## 2024-03-20 NOTE — Progress Notes (Addendum)
 Spoke with Dr. Adrian office regarding bridging of Lovenox, patient was unaware of this but did know to hold rivaroxaban  (XARELTO ) two days prior. They said they make team aware and follow up with patient

## 2024-03-20 NOTE — Telephone Encounter (Signed)
  Jennifer from [facility] following up the pt's levonox bridge since pt was not aware that he needs to do that for upcoming procedure

## 2024-03-20 NOTE — Telephone Encounter (Signed)
 Pharmacy team,  Do you know if patient has been set up for Lovenox bridge?   Thank you!  DW

## 2024-03-20 NOTE — Progress Notes (Signed)
 PCP -   Saguier, [place], PA-C  Cardiologist -  Waddell Danelle ORN, MD   PPM/ICD - denies Device Orders - n/a Rep Notified - n/a  Chest x-ray - 11-11-23 EKG - 11-10-23 Stress Test - 02-21-24 ECHO - 10-29-21 Cardiac Cath - 12-22-15  Sleep Study -  CPAP - uese nightly  Dm-per patient Pre DM metFORMIN  (GLUCOPHAGE ) Last dose 03-26-24 Zebound Last Dose 03-12-24  Blood Thinner Instructions:rivaroxaban  (XARELTO )  Aspirin  Instructions: denies  ERAS Protcol - clear liquids until 9:40 am.  PRE-SURGERY Ensure   COVID TEST-    Anesthesia review: yes, Hx of OSA, A fib,SVT  Patient denies shortness of breath, fever, cough and chest pain at PAT appointment   All instructions explained to the patient, with a verbal understanding of the material. Patient agrees to go over the instructions while at home for a better understanding. Patient also instructed to self quarantine after being tested for COVID-19. The opportunity to ask questions was provided.

## 2024-03-21 ENCOUNTER — Telehealth: Payer: Self-pay | Admitting: Orthopedic Surgery

## 2024-03-21 NOTE — Telephone Encounter (Signed)
 Pre-op team,   Please contact the requesting office and let them know patient will NOT need Lovenox bridging around procedure. It was previously stated in error that patient would need bridging.   Per Pharm D, patient has not had an Afib/aflutter ablation within the last 3 months or DCCV within the last 30 days. Patient may hold Xarelto  for 3 days prior to procedure.  Patient will not need bridging with Lovenox around procedure.    Thank you!  Barnie HERO. Annarose Ouellet, DNP, NP-C  03/21/2024, 9:37 AM [facility] [address] Office [phone] Fax [phone]

## 2024-03-21 NOTE — Progress Notes (Signed)
 Anesthesia Chart Review:  Case: [id number] Date/Time: 03/27/24 1227   Procedure: ARTHROPLASTY, KNEE, TOTAL (Left: Knee)   Anesthesia type: Spinal   Diagnosis: Primary osteoarthritis of left knee [M17.12]   Pre-op diagnosis: left knee osteoarthritis   [facility] / [facility]   Surgeons: Addie Cordella Hamilton, MD       DISCUSSION: Patient is a 68 year old male scheduled for the above procedure.  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  PCP preoperative input per Saguier, Edward, PA-C:  Patient last visit with me on 01/20/2024 was not preop evaluation.  He was clinically stable.  From a noncardiac standpoint I think low risk but taking into account cardiac risk may be higher risk.  Therefore would rely on cardiologist assessment.  Defer to cardiologist as well regarding Xarelto  instructions.  Preoperative cardiology input outlined by Percy Browning, NP following telephone evaluation on 03/12/24. She wrote: Preoperative Cardiovascular Risk Assessment: According to the Revised Cardiac Risk Index (RCRI), his Perioperative Risk of Major Cardiac Event is (%): 0.9. His Functional Capacity in METs is: 4.7 according to the Duke Activity Status Index (DASI). He underwent cardiac PET CT on 02/21/24 that was intermediate risk, reviewed with primary cardiologist Dr. Waddell who advised that he may proceed without further testing.    Waddell Danelle ORN, MD  Addie Cordella Hamilton, MD; Percy Browning HERO, NP Dr. Addie, I have been asked to give my input regarding Mr. Kracht who you plan knee surgery on in the next few weeks. He had a negative heart cath in 2017 and a low risk stress test in 2023. He  is an acceptable surgical risk and no additional testing is required. Danelle Waddell...  He had updated anticoagulation instructions on 03/21/24 (see telephone encounter by Loistine Sober, NP): Per Pharm D, patient has not had an Afib/aflutter ablation within the last 3 months or DCCV within the last 30 days. Patient may hold Xarelto  for 3 days prior to procedure.  Patient will not need bridging with Lovenox around procedure.   A1c 5.9% on 01/18/24.  Last Zepbound  03/12/2024.   VS: BP 130/89   Pulse 65   Temp 36.7 C   Resp 18   Ht 5' 8 (1.727 m)   Wt 110.2 kg   SpO2 99%   BMI 36.95 kg/m    PROVIDERS: Saguier, [place] RIGGERS is PCP. Last visit 01/20/24.  Waddell Danelle, MD is cardiologist Kassie Acquanetta Bradley, MD is pulmonologist. Last visit 10/11/23.  Dohmeier, Dedra, MD is neurologist ([facility]) [place], MD is allergist Francyne Romano, MD is [facility] provider, last visit 03/13/24.   LABS: Labs reviewed: Acceptable for surgery. (all labs ordered are listed, but only abnormal results are displayed)  Labs Reviewed  GLUCOSE, CAPILLARY - Abnormal; Notable for the following components:      Result Value   Glucose-Capillary 110 (*)    All other components within normal limits  URINALYSIS, W/ REFLEX TO CULTURE (INFECTION SUSPECTED) - Abnormal; Notable for the following components:   Hgb urine dipstick SMALL (*)    Bacteria, UA RARE (*)    All other components within normal limits  SURGICAL PCR SCREEN  CBC  BASIC METABOLIC PANEL WITH GFR    OTHER: CPAP Titration Study  11/29/23: RECOMMENDATIONS:   The patient responded well to a VITERA large FFM. He slept poorly, mainly having trouble initiating sleep.  Under 9 cm water CPAP with 2 cm water EPR, there was  only an AHI of  less than 1/ hour present.  -  I recommend to use CPAP at a 9- 12  cm water pressure with 2 cm EPR , starting at 7 cm RAMP.  Additional use of oxygen will not be required.   PFTs 10/06/23: FVC  3.21 (74%) FEV1 2.57 (80%) Ratio 80  TLC 93% DLCO 104%. No significant BD response. Interpretation: Normal PFTs.  Home Sleep Study 03/18/21: Calculated pAHI (per hour): Apnea hypopnea index overall was 50.9/h (which is indicative of a very severe sleep apne.  The majority of events led to an oxygen desaturation of 4% or more.  The REM sleep dependent apnea-hypopnea index was only 14.9/h which is usually a sign that the patient has more central than obstructive sleep apnea.  His non-REM sleep AHI was 55.2/h..                          Supine AHI: Apnea-hypopnea index in supine position was 66.2/h and in nonsupine positions 33.5/h.   Snoring level and decibels was 43 dB the RDI was also very high at 55.7/h...                                            The patient only experienced 0.2 minutes of total sleep time at or below 88% saturation... IMPRESSION:   This HST confirms the presence of severe sleep apnea but the non-REM sleep dependent form of sleep apnea raises the index of suspicion for central apnea to be present...   IMAGES: CT Chest (over read PET CT) 02/21/24: IMPRESSION: - No acute or clinically significant extracardiac findings in the chest. - Hepatic steatosis.   EKG: 11/10/23: Marked sinus Bradycardia at 49 bpm -RSR(V1) -nondiagnostic. BORDERLINE RHYTHM   CV: NM PET CT Cardiac Perfusion Study 02/21/24:   Findings are consistent with apical ischemia due to mid-dstal LAD artery stenosis. The study is intermediate risk.   LV perfusion is abnormal. There is evidence of ischemia. There is no evidence of infarction. Defect 1: There is a medium defect with moderate reduction in uptake present in the apical anterior, anteroseptal and apex location(s) that is reversible. There is normal wall motion in the defect area. Consistent with ischemia. The defect is consistent with abnormal perfusion in the distal  LAD territory.   Rest left ventricular function is normal. Rest EF: 64%. Stress left  ventricular function is normal. Stress EF: 59%. End diastolic cavity size is normal. End systolic cavity size is normal.   Myocardial blood flow was computed to be 0.42ml/g/min at rest and 1.32ml/g/min at stress. Global myocardial blood flow reserve was 1.97 and was mildly abnormal. In the LAD territory flow reserve is 1.84 and in the territory of the perfusion abnormality, flow reserve is 1.20.   Coronary calcium  was present on the attenuation correction CT images. Moderate coronary calcifications were present. Coronary calcifications were present in the left anterior descending artery and left circumflex artery distribution(s).   Electronically signed by Jerel Balding, MD   Echo 10/29/21: IMPRESSIONS   1. Left ventricular ejection fraction, by estimation, is 60 to 65%. The  left ventricle has normal function.  The left ventricle has no regional  wall motion abnormalities. Left ventricular diastolic parameters are  consistent with Grade II diastolic  dysfunction (pseudonormalization). The average left ventricular global  longitudinal strain is 21.6 %. The global longitudinal strain is normal.   2. Right ventricular systolic function is normal. The right ventricular  size is normal. There is normal pulmonary artery systolic pressure.   3. Left atrial size was mildly dilated.   4. The mitral valve is normal in structure. Mild mitral valve  regurgitation. No evidence of mitral stenosis.   5. The aortic valve is normal in structure. Aortic valve regurgitation is  not visualized. No aortic stenosis is present.   6. Aortic dilatation noted. There is mild dilatation of the ascending  aorta, measuring 41 mm.   7. The inferior vena cava is normal in size with greater than 50%  respiratory variability, suggesting right atrial pressure of 3 mmHg.    Long term monitor 06/24/21 - 06/27/21: 1. NSR with sinus brady and sinus tachycardia 2. Brief NS atrial tachycardia 3. Rare PAC's and PVC's 4. No  prolonged pauses 5. No atrial fib    Cardiac cath 12/22/15: Prox LAD to Mid LAD lesion, 20% stenosed. Ost Cx to Prox Cx lesion, 10% stenosed. The left ventricular systolic function is normal.  1. Mild nonobstructive CAD 2. Normal LV function Plan: medical management.   Past Medical History:  Diagnosis Date   Arthritis    DDD (degenerative disc disease), lumbar    L3/L4   Diverticulosis    Dysrhythmia    SVT s/p ablation   Dysrhythmia    PAF   GERD (gastroesophageal reflux disease)    Hyperlipidemia    Low sperm motility    Myocardial infarction (HCC)    Pre-diabetes    Sleep apnea    uses cpap    SVT (supraventricular tachycardia) [facility])     Past Surgical History:  Procedure Laterality Date   AMPUTATION Left 07/15/2018   Procedure: . REPAIR OF LEFT INDEX AND LONG FINGER BY FUSION /  REPAIR OF RING FINGER;  Surgeon: Sebastian Lenis, MD;  Location: [facility] OR;  Service: Orthopedics;  Laterality: Left;   BACK SURGERY     BLALOCK PROCEDURE     Ablation   CARDIAC CATHETERIZATION N/A 12/22/2015   Procedure: Left Heart Cath and Coronary Angiography;  Surgeon: Peter M Swaziland, MD;  Location[facility] INVASIVE CV LAB;  Service: Cardiovascular;  Laterality: N/A;   CARDIAC ELECTROPHYSIOLOGY STUDY AND ABLATION     ELECTROPHYSIOLOGIC STUDY N/A 02/19/2016   Procedure: SVT Ablation;  Surgeon: Danelle LELON Birmingham, MD;  Location: [facility] INVASIVE CV LAB;  Service: Cardiovascular;  Laterality: N/A;   HAND SURGERY     septal reconstruction  2017   TURBINATE REDUCTION Bilateral 07/2016   WISDOM TOOTH EXTRACTION      MEDICATIONS:  acetaminophen  (TYLENOL ) 325 MG tablet   albuterol  (PROAIR  HFA) 108 (90 Base) MCG/ACT inhaler   azelastine  (ASTELIN ) 0.1 % nasal spray   benzonatate  (TESSALON ) 200 MG capsule   Budeson-Glycopyrrol-Formoterol  (BREZTRI  AEROSPHERE) 160-9-4.8 MCG/ACT AERO   doxycycline  (VIBRA -TABS) 100 MG tablet   ezetimibe  (ZETIA ) 10 MG tablet   fenofibrate  (TRICOR ) 145 MG tablet   flecainide   (TAMBOCOR ) 150 MG tablet   guaiFENesin  (MUCINEX ) 600 MG 12 hr tablet   ibuprofen  (ADVIL ) 200 MG tablet   losartan  (COZAAR ) 25 MG tablet   Magnesium  250 MG TABS   metFORMIN  (GLUCOPHAGE ) 500 MG tablet   metoprolol  tartrate (LOPRESSOR ) 25 MG tablet  pantoprazole  (PROTONIX ) 40 MG tablet   PRESCRIPTION MEDICATION   rivaroxaban  (XARELTO ) 20 MG TABS tablet   sildenafil  (VIAGRA ) 100 MG tablet   Spacer/Aero-Holding Chambers (AEROCHAMBER MV) inhaler   tirzepatide  (ZEPBOUND ) 5 MG/0.5ML Pen   Vitamin D , Ergocalciferol , (DRISDOL ) 1.25 MG (50000 UNIT) CAPS capsule   No current facility-administered medications for this encounter.   He is not currently taking acetaminophen , Tessalon , doxycycline  (prescribed in April), Mucinex .   Isaiah Ruder, PA-C Surgical Short Stay/Anesthesiology [facility] Phone [phone] [facility] Phone [phone] 03/21/2024 6:27 PM

## 2024-03-21 NOTE — Telephone Encounter (Signed)
 Please disregard previous PharmD clearance.  Patient with diagnosis of atrial fibrillation on Xarelto  for anticoagulation.    Procedure:  LEFT TOTAL KNEE ARTHROPLASTY   Date of Surgery:  Clearance TBD  (AUGUST)   CHA2DS2-VASc Score = 2   This indicates a 2.2% annual risk of stroke. The patient's score is based upon: CHF History: 0 HTN History: 1 Diabetes History: 0 Stroke History: 0 Vascular Disease History: 0 Age Score: 1 Gender Score: 0   CrCl 101 Platelet count 195  Patient has not had an Afib/aflutter ablation within the last 3 months or DCCV within the last 30 days  Per office protocol, patient can hold Xarelto  for 3 days prior to procedure.  (Needs 3 day hold for spinal anesthesia) Patient will NOT need bridging with Lovenox (enoxaparin) around procedure.  **This guidance is not considered finalized until pre-operative APP has relayed final recommendations.**

## 2024-03-21 NOTE — Telephone Encounter (Signed)
 Called patient to let him know his Xarelto  will be held for this right total knee.  He needs a 3 days hold (no bridge). Last dose 8/1.  He stated he understands the instructions.  Patient states the situation for recovery has changed at home and his daughter is working.  Patient is requesting to stay in the hospital longer than 1 night. States he would like to speak with the doctor about this.   He is asking if he can stay 3 or 4 nights more because he is afraid something might happen to him if he goes home and is alone. Left total knee arthroplasty is scheduled 03-27-24.

## 2024-03-21 NOTE — Telephone Encounter (Signed)
 I will update the requesting office to see notes from Barnie Hila, NP in regard to pt does not need Lovenox bridging, see notes

## 2024-03-22 NOTE — Anesthesia Preprocedure Evaluation (Addendum)
 Anesthesia Evaluation  Patient identified by MRN, date of birth, ID band Patient awake    Reviewed: Allergy & Precautions, NPO status , Patient's Chart, lab work & pertinent test results, reviewed documented beta blocker date and time   Airway Mallampati: II  TM Distance: >3 FB Neck ROM: Full    Dental no notable dental hx. (+) Teeth Intact, Dental Advisory Given   Pulmonary asthma , sleep apnea and Continuous Positive Airway Pressure Ventilation , former smoker   Pulmonary exam normal breath sounds clear to auscultation       Cardiovascular hypertension, Pt. on medications and Pt. on home beta blockers + Past MI (NSTEMI 2017)  Normal cardiovascular exam+ dysrhythmias Atrial Fibrillation and Supra Ventricular Tachycardia  Rhythm:Regular Rate:Normal  NM PET CT Cardiac Perfusion Study 02/21/24:   Findings are consistent with apical ischemia due to mid-dstal LAD artery stenosis. The study is intermediate risk.   LV perfusion is abnormal. There is evidence of ischemia. There is no evidence of infarction. Defect 1: There is a medium defect with moderate reduction in uptake present in the apical anterior, anteroseptal and apex location(s) that is reversible. There is normal wall motion in the defect area. Consistent with ischemia. The defect is consistent with abnormal perfusion in the distal  LAD territory.   Rest left ventricular function is normal. Rest EF: 64%. Stress left ventricular function is normal. Stress EF: 59%. End diastolic cavity size is normal. End systolic cavity size is normal.   Myocardial blood flow was computed to be 0.59ml/g/min at rest and 1.32ml/g/min at stress. Global myocardial blood flow reserve was 1.97 and was mildly abnormal. In the LAD territory flow reserve is 1.84 and in the territory of the perfusion abnormality, flow reserve is 1.20.   Coronary calcium  was present on the attenuation correction CT images.  Moderate coronary calcifications were present. Coronary calcifications were present in the left anterior descending artery and left circumflex artery distribution(s).   Electronically signed by Jerel Balding, MD    Neuro/Psych    GI/Hepatic ,GERD  Medicated and Controlled,,  Endo/Other  diabetes, Type 2, Oral Hypoglycemic Agents    Renal/GU Lab Results      Component                Value               Date                                    K                        4.2                 03/20/2024                    CREATININE               1.06                03/20/2024                GFRNONAA                 >60                 03/20/2024  Musculoskeletal  (+) Arthritis ,    Abdominal   Peds  Hematology Pt on Xarelto  check last dose  Lab Results      Component                Value               Date                      WBC                      6.7                 03/20/2024                HGB                      16.2                03/20/2024                HCT                      48.7                03/20/2024                MCV                      85.9                03/20/2024                PLT                      237                 03/20/2024              Anesthesia Other Findings   Reproductive/Obstetrics                              Anesthesia Physical Anesthesia Plan  ASA: 3  Anesthesia Plan: Spinal   Post-op Pain Management: Ofirmev  IV (intra-op)* and Precedex   Induction:   PONV Risk Score and Plan: Treatment may vary due to age or medical condition, Ondansetron  and Propofol  infusion  Airway Management Planned: Natural Airway and Nasal Cannula  Additional Equipment: None  Intra-op Plan:   Post-operative Plan:   Informed Consent: I have reviewed the patients History and Physical, chart, labs and discussed the procedure including the risks, benefits and alternatives for the proposed anesthesia  with the patient or authorized representative who has indicated his/her understanding and acceptance.     Dental advisory given  Plan Discussed with: CRNA and Surgeon  Anesthesia Plan Comments: (PAT note written 03/22/2024 by Isaiah Ruder, PA-C  Spinal + L adductor   Check last xarelto  dose 03/23/2024  And hx L4-5 ack surgery  )         Anesthesia Quick Evaluation

## 2024-03-23 ENCOUNTER — Encounter: Payer: Self-pay | Admitting: Medical

## 2024-03-23 ENCOUNTER — Other Ambulatory Visit: Payer: Self-pay

## 2024-03-23 DIAGNOSIS — E559 Vitamin D deficiency, unspecified: Secondary | ICD-10-CM

## 2024-03-23 MED ORDER — VITAMIN D (ERGOCALCIFEROL) 1.25 MG (50000 UNIT) PO CAPS
50000.0000 [IU] | ORAL_CAPSULE | ORAL | 3 refills | Status: DC
  Filled 2024-05-31 – 2024-07-23 (×2): qty 12, 84d supply, fill #0

## 2024-03-23 NOTE — Telephone Encounter (Signed)
 Okay; thanks.

## 2024-03-26 MED ORDER — TRANEXAMIC ACID 1000 MG/10ML IV SOLN
2000.0000 mg | INTRAVENOUS | Status: DC
  Filled 2024-03-26: qty 20

## 2024-03-27 ENCOUNTER — Inpatient Hospital Stay (HOSPITAL_COMMUNITY)
Admission: RE | Admit: 2024-03-27 | Discharge: 2024-04-01 | DRG: 470 | Disposition: A | Discharge status: Home / Self Care | Payer: Medicare (Managed Care) | Attending: Orthopedic Surgery | Admitting: Orthopedic Surgery

## 2024-03-27 ENCOUNTER — Inpatient Hospital Stay (HOSPITAL_COMMUNITY)
Admission: RE | Disposition: A | Discharge status: Home / Self Care | Payer: Self-pay | Source: Home / Self Care | Attending: Orthopedic Surgery

## 2024-03-27 ENCOUNTER — Ambulatory Visit (HOSPITAL_COMMUNITY): Payer: Medicare (Managed Care) | Admitting: Anesthesiology

## 2024-03-27 ENCOUNTER — Other Ambulatory Visit: Payer: Self-pay

## 2024-03-27 ENCOUNTER — Ambulatory Visit (HOSPITAL_COMMUNITY): Payer: Medicare (Managed Care) | Admitting: Vascular Surgery

## 2024-03-27 ENCOUNTER — Encounter (HOSPITAL_COMMUNITY): Payer: Self-pay | Admitting: Orthopedic Surgery

## 2024-03-27 DIAGNOSIS — I10 Essential (primary) hypertension: Secondary | ICD-10-CM | POA: Diagnosis not present

## 2024-03-27 DIAGNOSIS — Z7984 Long term (current) use of oral hypoglycemic drugs: Secondary | ICD-10-CM

## 2024-03-27 DIAGNOSIS — Z87891 Personal history of nicotine dependence: Secondary | ICD-10-CM

## 2024-03-27 DIAGNOSIS — Z823 Family history of stroke: Secondary | ICD-10-CM

## 2024-03-27 DIAGNOSIS — E119 Type 2 diabetes mellitus without complications: Secondary | ICD-10-CM | POA: Diagnosis present

## 2024-03-27 DIAGNOSIS — J45909 Unspecified asthma, uncomplicated: Secondary | ICD-10-CM

## 2024-03-27 DIAGNOSIS — Z9109 Other allergy status, other than to drugs and biological substances: Secondary | ICD-10-CM

## 2024-03-27 DIAGNOSIS — Z91014 Allergy to mammalian meats: Secondary | ICD-10-CM

## 2024-03-27 DIAGNOSIS — M1712 Unilateral primary osteoarthritis, left knee: Principal | ICD-10-CM

## 2024-03-27 DIAGNOSIS — E785 Hyperlipidemia, unspecified: Secondary | ICD-10-CM | POA: Diagnosis present

## 2024-03-27 DIAGNOSIS — Z01818 Encounter for other preprocedural examination: Principal | ICD-10-CM

## 2024-03-27 DIAGNOSIS — Z981 Arthrodesis status: Secondary | ICD-10-CM

## 2024-03-27 DIAGNOSIS — Z96652 Presence of left artificial knee joint: Secondary | ICD-10-CM

## 2024-03-27 DIAGNOSIS — M51369 Other intervertebral disc degeneration, lumbar region without mention of lumbar back pain or lower extremity pain: Secondary | ICD-10-CM | POA: Diagnosis present

## 2024-03-27 DIAGNOSIS — Z8349 Family history of other endocrine, nutritional and metabolic diseases: Secondary | ICD-10-CM

## 2024-03-27 DIAGNOSIS — I252 Old myocardial infarction: Secondary | ICD-10-CM

## 2024-03-27 DIAGNOSIS — Z6836 Body mass index (BMI) 36.0-36.9, adult: Secondary | ICD-10-CM

## 2024-03-27 DIAGNOSIS — K219 Gastro-esophageal reflux disease without esophagitis: Secondary | ICD-10-CM | POA: Diagnosis present

## 2024-03-27 DIAGNOSIS — E66812 Obesity, class 2: Secondary | ICD-10-CM | POA: Diagnosis present

## 2024-03-27 DIAGNOSIS — Z888 Allergy status to other drugs, medicaments and biological substances status: Secondary | ICD-10-CM

## 2024-03-27 DIAGNOSIS — I480 Paroxysmal atrial fibrillation: Secondary | ICD-10-CM | POA: Diagnosis present

## 2024-03-27 DIAGNOSIS — Z8249 Family history of ischemic heart disease and other diseases of the circulatory system: Secondary | ICD-10-CM

## 2024-03-27 DIAGNOSIS — G4733 Obstructive sleep apnea (adult) (pediatric): Secondary | ICD-10-CM | POA: Diagnosis present

## 2024-03-27 HISTORY — PX: TOTAL KNEE ARTHROPLASTY: SHX125

## 2024-03-27 LAB — GLUCOSE, CAPILLARY
Glucose-Capillary: 100 mg/dL — ABNORMAL HIGH (ref 70–99)
Glucose-Capillary: 89 mg/dL (ref 70–99)
Glucose-Capillary: 98 mg/dL (ref 70–99)
Glucose-Capillary: 83 mg/dL (ref 70–99)

## 2024-03-27 SURGERY — ARTHROPLASTY, KNEE, TOTAL
Anesthesia: Spinal | Site: Knee | Laterality: Left

## 2024-03-27 MED ORDER — BUDESON-GLYCOPYRROL-FORMOTEROL 160-9-4.8 MCG/ACT IN AERO
2.0000 | INHALATION_SPRAY | Freq: Two times a day (BID) | RESPIRATORY_TRACT | Status: DC
  Administered 2024-03-27 – 2024-04-01 (×9): 2 via RESPIRATORY_TRACT
  Filled 2024-03-27: qty 5.9

## 2024-03-27 MED ORDER — OXYCODONE HCL 5 MG PO TABS: 5.0000 mg | ORAL_TABLET | Freq: Once | ORAL | Status: DC | PRN

## 2024-03-27 MED ORDER — CEFAZOLIN SODIUM-DEXTROSE 2-4 GM/100ML-% IV SOLN
INTRAVENOUS | Status: AC
  Filled 2024-03-27: qty 100

## 2024-03-27 MED ORDER — BUPIVACAINE LIPOSOME 1.3 % IJ SUSP
INTRAMUSCULAR | Status: AC
  Filled 2024-03-27: qty 20

## 2024-03-27 MED ORDER — PHENYLEPHRINE 80 MCG/ML (10ML) SYRINGE FOR IV PUSH (FOR BLOOD PRESSURE SUPPORT)
PREFILLED_SYRINGE | INTRAVENOUS | Status: AC
  Filled 2024-03-27: qty 10

## 2024-03-27 MED ORDER — IRRISEPT - 450ML BOTTLE WITH 0.05% CHG IN STERILE WATER, USP 99.95% OPTIME
TOPICAL | Status: DC | PRN
  Administered 2024-03-27: 450 mL

## 2024-03-27 MED ORDER — FENTANYL CITRATE PF 50 MCG/ML IJ SOSY
50.0000 ug | PREFILLED_SYRINGE | Freq: Once | INTRAMUSCULAR | Status: AC
  Administered 2024-03-27: 50 ug via INTRAVENOUS

## 2024-03-27 MED ORDER — VANCOMYCIN HCL 1000 MG IV SOLR
INTRAVENOUS | Status: DC | PRN
  Administered 2024-03-27 (×2): 1000 mg via TOPICAL

## 2024-03-27 MED ORDER — PANTOPRAZOLE SODIUM 40 MG PO TBEC
40.0000 mg | DELAYED_RELEASE_TABLET | Freq: Every day | ORAL | Status: DC
  Administered 2024-03-27 – 2024-04-01 (×6): 40 mg via ORAL
  Filled 2024-03-27 (×6): qty 1

## 2024-03-27 MED ORDER — FENTANYL CITRATE (PF) 250 MCG/5ML IJ SOLN
INTRAMUSCULAR | Status: AC
  Filled 2024-03-27: qty 5

## 2024-03-27 MED ORDER — EPHEDRINE SULFATE-NACL 50-0.9 MG/10ML-% IV SOSY
PREFILLED_SYRINGE | INTRAVENOUS | Status: DC | PRN
  Administered 2024-03-27 (×2): 5 mg via INTRAVENOUS
  Administered 2024-03-27: 10 mg via INTRAVENOUS
  Administered 2024-03-27: 5 mg via INTRAVENOUS

## 2024-03-27 MED ORDER — CHLORHEXIDINE GLUCONATE 0.12 % MT SOLN
OROMUCOSAL | Status: AC
  Administered 2024-03-27: 15 mL via OROMUCOSAL
  Filled 2024-03-27: qty 15

## 2024-03-27 MED ORDER — ACETAMINOPHEN 500 MG PO TABS
1000.0000 mg | ORAL_TABLET | Freq: Four times a day (QID) | ORAL | Status: AC
  Administered 2024-03-27 – 2024-03-28 (×2): 1000 mg via ORAL
  Filled 2024-03-27 (×3): qty 2

## 2024-03-27 MED ORDER — SODIUM CHLORIDE 0.9 % IR SOLN
Status: DC | PRN
  Administered 2024-03-27: 3000 mL

## 2024-03-27 MED ORDER — AMISULPRIDE (ANTIEMETIC) 5 MG/2ML IV SOLN: 10.0000 mg | Freq: Once | INTRAVENOUS | Status: DC | PRN

## 2024-03-27 MED ORDER — CLONIDINE HCL (ANALGESIA) 100 MCG/ML EP SOLN
EPIDURAL | Status: DC | PRN
  Administered 2024-03-27: 100 ug

## 2024-03-27 MED ORDER — ONDANSETRON HCL 4 MG/2ML IJ SOLN: 4.0000 mg | Freq: Once | INTRAMUSCULAR | Status: DC | PRN

## 2024-03-27 MED ORDER — MIDAZOLAM HCL 2 MG/2ML IJ SOLN
INTRAMUSCULAR | Status: AC
  Administered 2024-03-27: 2 mg via INTRAVENOUS
  Filled 2024-03-27: qty 2

## 2024-03-27 MED ORDER — ONDANSETRON HCL 4 MG/2ML IJ SOLN
INTRAMUSCULAR | Status: AC
  Filled 2024-03-27: qty 2

## 2024-03-27 MED ORDER — ROPIVACAINE HCL 5 MG/ML IJ SOLN
INTRAMUSCULAR | Status: DC | PRN
  Administered 2024-03-27: 30 mL via PERINEURAL

## 2024-03-27 MED ORDER — FENOFIBRATE 160 MG PO TABS
160.0000 mg | ORAL_TABLET | Freq: Every day | ORAL | Status: DC
  Administered 2024-03-27 – 2024-04-01 (×6): 160 mg via ORAL
  Filled 2024-03-27 (×7): qty 1

## 2024-03-27 MED ORDER — EPHEDRINE 5 MG/ML INJ
INTRAVENOUS | Status: AC
  Filled 2024-03-27: qty 5

## 2024-03-27 MED ORDER — MORPHINE SULFATE (PF) 4 MG/ML IV SOLN
INTRAVENOUS | Status: AC
  Filled 2024-03-27: qty 1

## 2024-03-27 MED ORDER — METFORMIN HCL 500 MG PO TABS
500.0000 mg | ORAL_TABLET | Freq: Two times a day (BID) | ORAL | Status: DC
  Administered 2024-03-27 – 2024-04-01 (×10): 500 mg via ORAL
  Filled 2024-03-27 (×10): qty 1

## 2024-03-27 MED ORDER — PHENYLEPHRINE 80 MCG/ML (10ML) SYRINGE FOR IV PUSH (FOR BLOOD PRESSURE SUPPORT): PREFILLED_SYRINGE | INTRAVENOUS | Status: DC | PRN

## 2024-03-27 MED ORDER — PHENYLEPHRINE HCL-NACL 20-0.9 MG/250ML-% IV SOLN
INTRAVENOUS | Status: DC | PRN
  Administered 2024-03-27: 40 ug/min via INTRAVENOUS

## 2024-03-27 MED ORDER — CHLORHEXIDINE GLUCONATE 0.12 % MT SOLN: 15.0000 mL | OROMUCOSAL | Status: AC

## 2024-03-27 MED ORDER — CEFAZOLIN SODIUM-DEXTROSE 2-4 GM/100ML-% IV SOLN
2.0000 g | Freq: Three times a day (TID) | INTRAVENOUS | Status: AC
  Administered 2024-03-28: 2 g via INTRAVENOUS
  Filled 2024-03-27: qty 100

## 2024-03-27 MED ORDER — METOPROLOL TARTRATE 25 MG PO TABS
25.0000 mg | ORAL_TABLET | Freq: Two times a day (BID) | ORAL | Status: DC
  Administered 2024-03-27 – 2024-04-01 (×10): 25 mg via ORAL
  Filled 2024-03-27 (×10): qty 1

## 2024-03-27 MED ORDER — HYDROMORPHONE HCL 1 MG/ML IJ SOLN
INTRAMUSCULAR | Status: AC
  Filled 2024-03-27: qty 1

## 2024-03-27 MED ORDER — CEFAZOLIN SODIUM-DEXTROSE 2-4 GM/100ML-% IV SOLN
2.0000 g | Freq: Three times a day (TID) | INTRAVENOUS | Status: AC
  Administered 2024-03-27: 2 g via INTRAVENOUS
  Filled 2024-03-27: qty 100

## 2024-03-27 MED ORDER — HYDROMORPHONE HCL 1 MG/ML IJ SOLN
0.5000 mg | INTRAMUSCULAR | Status: DC | PRN
  Administered 2024-03-27 – 2024-03-30 (×10): 0.5 mg via INTRAVENOUS
  Filled 2024-03-27 (×11): qty 0.5

## 2024-03-27 MED ORDER — MENTHOL 3 MG MT LOZG: 1.0000 | LOZENGE | OROMUCOSAL | Status: DC | PRN

## 2024-03-27 MED ORDER — MORPHINE SULFATE 4 MG/ML IJ SOLN
INTRAVENOUS | Status: DC | PRN
  Administered 2024-03-27: 13 mL via INTRAMUSCULAR

## 2024-03-27 MED ORDER — OXYCODONE HCL 5 MG/5ML PO SOLN: 5.0000 mg | Freq: Once | ORAL | Status: DC | PRN

## 2024-03-27 MED ORDER — LACTATED RINGERS IV SOLN: INTRAVENOUS | Status: DC

## 2024-03-27 MED ORDER — HYDROMORPHONE HCL 1 MG/ML IJ SOLN
0.2500 mg | INTRAMUSCULAR | Status: DC | PRN
  Administered 2024-03-27 (×2): 0.5 mg via INTRAVENOUS

## 2024-03-27 MED ORDER — TRANEXAMIC ACID-NACL 1000-0.7 MG/100ML-% IV SOLN
INTRAVENOUS | Status: AC
  Filled 2024-03-27: qty 100

## 2024-03-27 MED ORDER — BUPIVACAINE HCL (PF) 0.25 % IJ SOLN
INTRAMUSCULAR | Status: AC
Start: 2024-03-27 — End: 2024-03-27
  Filled 2024-03-27: qty 30

## 2024-03-27 MED ORDER — FENTANYL CITRATE (PF) 100 MCG/2ML IJ SOLN
INTRAMUSCULAR | Status: AC
  Filled 2024-03-27: qty 2

## 2024-03-27 MED ORDER — ACETAMINOPHEN 325 MG PO TABS
325.0000 mg | ORAL_TABLET | Freq: Four times a day (QID) | ORAL | Status: DC | PRN
  Administered 2024-03-29 – 2024-03-31 (×4): 650 mg via ORAL
  Filled 2024-03-27 (×4): qty 2

## 2024-03-27 MED ORDER — ONDANSETRON HCL 4 MG/2ML IJ SOLN
INTRAMUSCULAR | Status: DC | PRN
  Administered 2024-03-27: 4 mg via INTRAVENOUS

## 2024-03-27 MED ORDER — TRANEXAMIC ACID 1000 MG/10ML IV SOLN
INTRAVENOUS | Status: DC | PRN
  Administered 2024-03-27: 2000 mg via TOPICAL

## 2024-03-27 MED ORDER — SODIUM CHLORIDE (PF) 0.9 % IJ SOLN
INTRAMUSCULAR | Status: AC
  Filled 2024-03-27: qty 50

## 2024-03-27 MED ORDER — MIDAZOLAM HCL 2 MG/2ML IJ SOLN
INTRAMUSCULAR | Status: AC
Start: 2024-03-27 — End: 2024-03-27
  Filled 2024-03-27: qty 2

## 2024-03-27 MED ORDER — METOCLOPRAMIDE HCL 5 MG/ML IJ SOLN: 5.0000 mg | Freq: Three times a day (TID) | INTRAMUSCULAR | Status: DC | PRN

## 2024-03-27 MED ORDER — POVIDONE-IODINE 10 % EX SWAB: 2.0000 | Freq: Once | CUTANEOUS | Status: DC

## 2024-03-27 MED ORDER — CEFAZOLIN SODIUM-DEXTROSE 2-4 GM/100ML-% IV SOLN
2.0000 g | INTRAVENOUS | Status: AC
  Administered 2024-03-27: 2 g via INTRAVENOUS

## 2024-03-27 MED ORDER — 0.9 % SODIUM CHLORIDE (POUR BTL) OPTIME
TOPICAL | Status: DC | PRN
Start: 2024-03-27 — End: 2024-03-27
  Administered 2024-03-27 (×3): 1000 mL

## 2024-03-27 MED ORDER — POVIDONE-IODINE 7.5 % EX SOLN: Freq: Once | CUTANEOUS | Status: DC

## 2024-03-27 MED ORDER — BUPIVACAINE IN DEXTROSE 0.75-8.25 % IT SOLN
INTRATHECAL | Status: DC | PRN
  Administered 2024-03-27: 15 mg via INTRATHECAL

## 2024-03-27 MED ORDER — RIVAROXABAN 20 MG PO TABS
20.0000 mg | ORAL_TABLET | Freq: Every day | ORAL | Status: DC
  Administered 2024-03-28 – 2024-03-31 (×4): 20 mg via ORAL
  Filled 2024-03-27 (×4): qty 1

## 2024-03-27 MED ORDER — ASPIRIN 81 MG PO CHEW
81.0000 mg | CHEWABLE_TABLET | Freq: Two times a day (BID) | ORAL | Status: DC
  Administered 2024-03-27: 81 mg via ORAL
  Filled 2024-03-27: qty 1

## 2024-03-27 MED ORDER — STERILE WATER FOR IRRIGATION IR SOLN
Status: DC | PRN
  Administered 2024-03-27: 1000 mL

## 2024-03-27 MED ORDER — PHENOL 1.4 % MT LIQD: 1.0000 | OROMUCOSAL | Status: DC | PRN

## 2024-03-27 MED ORDER — SODIUM CHLORIDE (PF) 0.9 % IJ SOLN
INTRAMUSCULAR | Status: DC | PRN
  Administered 2024-03-27: 60 mL via INTRAMUSCULAR

## 2024-03-27 MED ORDER — VANCOMYCIN HCL 1000 MG IV SOLR
INTRAVENOUS | Status: AC
Start: 2024-03-27 — End: 2024-03-27
  Filled 2024-03-27: qty 20

## 2024-03-27 MED ORDER — ONDANSETRON HCL 4 MG PO TABS: 4.0000 mg | ORAL_TABLET | Freq: Four times a day (QID) | ORAL | Status: DC | PRN

## 2024-03-27 MED ORDER — OXYCODONE HCL 5 MG PO TABS
5.0000 mg | ORAL_TABLET | ORAL | Status: DC | PRN
  Administered 2024-03-27 – 2024-04-01 (×23): 10 mg via ORAL
  Filled 2024-03-27 (×23): qty 2

## 2024-03-27 MED ORDER — METOCLOPRAMIDE HCL 5 MG PO TABS: 5.0000 mg | ORAL_TABLET | Freq: Three times a day (TID) | ORAL | Status: DC | PRN

## 2024-03-27 MED ORDER — EZETIMIBE 10 MG PO TABS
10.0000 mg | ORAL_TABLET | Freq: Every day | ORAL | Status: DC
  Administered 2024-03-27 – 2024-04-01 (×6): 10 mg via ORAL
  Filled 2024-03-27 (×6): qty 1

## 2024-03-27 MED ORDER — LOSARTAN POTASSIUM 25 MG PO TABS
25.0000 mg | ORAL_TABLET | Freq: Every day | ORAL | Status: DC
  Administered 2024-03-27 – 2024-04-01 (×5): 25 mg via ORAL
  Filled 2024-03-27 (×6): qty 1

## 2024-03-27 MED ORDER — ACETAMINOPHEN 10 MG/ML IV SOLN
1000.0000 mg | Freq: Once | INTRAVENOUS | Status: DC | PRN
Start: 2024-03-27 — End: 2024-03-27
  Administered 2024-03-27: 1000 mg via INTRAVENOUS

## 2024-03-27 MED ORDER — METHOCARBAMOL 500 MG PO TABS
500.0000 mg | ORAL_TABLET | Freq: Four times a day (QID) | ORAL | Status: DC | PRN
  Administered 2024-03-27 – 2024-04-01 (×12): 500 mg via ORAL
  Filled 2024-03-27 (×12): qty 1

## 2024-03-27 MED ORDER — SODIUM CHLORIDE 0.9 % IV SOLN: INTRAVENOUS | Status: AC

## 2024-03-27 MED ORDER — CLONIDINE HCL (ANALGESIA) 100 MCG/ML EP SOLN
EPIDURAL | Status: AC
  Filled 2024-03-27: qty 10

## 2024-03-27 MED ORDER — MIDAZOLAM HCL 2 MG/2ML IJ SOLN: 2.0000 mg | Freq: Once | INTRAMUSCULAR | Status: AC

## 2024-03-27 MED ORDER — DOCUSATE SODIUM 100 MG PO CAPS
100.0000 mg | ORAL_CAPSULE | Freq: Two times a day (BID) | ORAL | Status: DC
  Administered 2024-03-27 – 2024-04-01 (×10): 100 mg via ORAL
  Filled 2024-03-27 (×10): qty 1

## 2024-03-27 MED ORDER — FLECAINIDE ACETATE 50 MG PO TABS
150.0000 mg | ORAL_TABLET | Freq: Two times a day (BID) | ORAL | Status: DC
  Administered 2024-03-27 – 2024-03-29 (×4): 150 mg via ORAL
  Filled 2024-03-27 (×6): qty 1

## 2024-03-27 MED ORDER — PROPOFOL 500 MG/50ML IV EMUL
INTRAVENOUS | Status: DC | PRN
  Administered 2024-03-27: 200 ug/kg/min via INTRAVENOUS
  Administered 2024-03-27: 125 ug/kg/min via INTRAVENOUS

## 2024-03-27 MED ORDER — ALBUTEROL SULFATE (2.5 MG/3ML) 0.083% IN NEBU
3.0000 mL | INHALATION_SOLUTION | Freq: Four times a day (QID) | RESPIRATORY_TRACT | Status: DC | PRN
  Administered 2024-03-28: 3 mL via RESPIRATORY_TRACT
  Filled 2024-03-27: qty 3

## 2024-03-27 MED ORDER — ACETAMINOPHEN 10 MG/ML IV SOLN
INTRAVENOUS | Status: AC
  Filled 2024-03-27: qty 100

## 2024-03-27 MED ORDER — FENTANYL CITRATE (PF) 250 MCG/5ML IJ SOLN
INTRAMUSCULAR | Status: DC | PRN
  Administered 2024-03-27 (×2): 25 ug via INTRAVENOUS
  Administered 2024-03-27: 50 ug via INTRAVENOUS

## 2024-03-27 SURGICAL SUPPLY — 69 items
BAG COUNTER SPONGE SURGICOUNT (BAG) ×1 IMPLANT
BAG DECANTER FOR FLEXI CONT (MISCELLANEOUS) ×1 IMPLANT
BANDAGE ESMARK 6X9 LF (GAUZE/BANDAGES/DRESSINGS) ×1 IMPLANT
BLADE SAG 18X100X1.27 (BLADE) ×1 IMPLANT
BLADE SAW SAG 90X13X1.27 (BLADE) ×1 IMPLANT
BNDG COHESIVE 6X5 TAN ST LF (GAUZE/BANDAGES/DRESSINGS) ×1 IMPLANT
BNDG ELASTIC 6X15 VLCR STRL LF (GAUZE/BANDAGES/DRESSINGS) ×1 IMPLANT
BOWL SMART MIX CTS (DISPOSABLE) IMPLANT
CLSR STERI-STRIP ANTIMIC 1/2X4 (GAUZE/BANDAGES/DRESSINGS) IMPLANT
CNTNR URN SCR LID CUP LEK RST (MISCELLANEOUS) ×1 IMPLANT
COMPONENT FEM KNEE STD PS 9 LT (Joint) IMPLANT
COMPONENT PATELLA 3 PEG 38 (Joint) IMPLANT
COMPONENT TIB KNEE PS 0D LT (Joint) IMPLANT
COVER SURGICAL LIGHT HANDLE (MISCELLANEOUS) ×1 IMPLANT
CUFF TOURN SGL QUICK 42 (TOURNIQUET CUFF) IMPLANT
CUFF TRNQT CYL 34X4.125X (TOURNIQUET CUFF) ×1 IMPLANT
DRAPE INCISE IOBAN 66X45 STRL (DRAPES) IMPLANT
DRAPE SURG ORHT 6 SPLT 77X108 (DRAPES) ×3 IMPLANT
DRAPE U-SHAPE 47X51 STRL (DRAPES) ×1 IMPLANT
DRSG AQUACEL AG ADV 3.5X14 (GAUZE/BANDAGES/DRESSINGS) IMPLANT
DURAPREP 26ML APPLICATOR (WOUND CARE) ×2 IMPLANT
ELECT CAUTERY BLADE 6.4 (BLADE) ×1 IMPLANT
ELECTRODE REM PT RTRN 9FT ADLT (ELECTROSURGICAL) ×1 IMPLANT
GAUZE SPONGE 4X4 12PLY STRL (GAUZE/BANDAGES/DRESSINGS) ×1 IMPLANT
GLOVE BIOGEL PI IND STRL 6.5 (GLOVE) ×1 IMPLANT
GLOVE BIOGEL PI IND STRL 7.0 (GLOVE) ×1 IMPLANT
GLOVE BIOGEL PI IND STRL 8 (GLOVE) ×1 IMPLANT
GLOVE ECLIPSE 7.0 STRL STRAW (GLOVE) ×1 IMPLANT
GLOVE ECLIPSE 8.0 STRL XLNG CF (GLOVE) ×1 IMPLANT
GLOVE SURG ENC MOIS LTX SZ6.5 (GLOVE) ×3 IMPLANT
GOWN STRL REUS W/ TWL LRG LVL3 (GOWN DISPOSABLE) ×3 IMPLANT
HOOD PEEL AWAY T7 (MISCELLANEOUS) ×3 IMPLANT
IMMOBILIZER KNEE 20 THIGH 36 (SOFTGOODS) IMPLANT
IMMOBILIZER KNEE 22 UNIV (SOFTGOODS) IMPLANT
IMMOBILIZER KNEE 24 THIGH 36 (SOFTGOODS) IMPLANT
KIT BASIN OR (CUSTOM PROCEDURE TRAY) ×1 IMPLANT
KIT TURNOVER KIT B (KITS) ×1 IMPLANT
LAVAGE JET IRRISEPT WOUND (IRRIGATION / IRRIGATOR) IMPLANT
MANIFOLD NEPTUNE II (INSTRUMENTS) ×1 IMPLANT
NDL 22X1.5 STRL (OR ONLY) (MISCELLANEOUS) ×2 IMPLANT
NDL SPNL 18GX3.5 QUINCKE PK (NEEDLE) ×1 IMPLANT
NEEDLE 22X1.5 STRL (OR ONLY) (MISCELLANEOUS) ×2 IMPLANT
NEEDLE SPNL 18GX3.5 QUINCKE PK (NEEDLE) ×1 IMPLANT
NS IRRIG 1000ML POUR BTL (IV SOLUTION) ×2 IMPLANT
PACK TOTAL JOINT (CUSTOM PROCEDURE TRAY) ×1 IMPLANT
PAD ARMBOARD POSITIONER FOAM (MISCELLANEOUS) ×2 IMPLANT
PAD CAST 4YDX4 CTTN HI CHSV (CAST SUPPLIES) ×1 IMPLANT
PADDING CAST COTTON 6X4 STRL (CAST SUPPLIES) ×1 IMPLANT
PIN DRILL HDLS TROCAR 75 4PK (PIN) IMPLANT
SCREW FEMALE HEX FIX 25X2.5 (ORTHOPEDIC DISPOSABLE SUPPLIES) IMPLANT
SET HNDPC FAN SPRY TIP SCT (DISPOSABLE) ×1 IMPLANT
SPIKE FLUID TRANSFER (MISCELLANEOUS) ×1 IMPLANT
STEM TIBIAL 10 8-11 EF POLY LT (Joint) IMPLANT
STRIP CLOSURE SKIN 1/2X4 (GAUZE/BANDAGES/DRESSINGS) ×2 IMPLANT
SUCTION TUBE FRAZIER 10FR DISP (SUCTIONS) ×1 IMPLANT
SUT MNCRL AB 3-0 PS2 18 (SUTURE) ×1 IMPLANT
SUT STRATAFIX SPIRAL PDS+ 0 30 (SUTURE) IMPLANT
SUT VIC AB 0 CT1 27XBRD ANBCTR (SUTURE) ×3 IMPLANT
SUT VIC AB 1 CT1 27XBRD ANBCTR (SUTURE) IMPLANT
SUT VIC AB 1 CT1 36 (SUTURE) ×5 IMPLANT
SUT VIC AB 2-0 CT2 27 (SUTURE) ×4 IMPLANT
SUT VICRYL 0 27 CT2 27 ABS (SUTURE) ×1 IMPLANT
SYR 30ML LL (SYRINGE) ×3 IMPLANT
SYR TB 1ML LUER SLIP (SYRINGE) ×1 IMPLANT
TOWEL GREEN STERILE (TOWEL DISPOSABLE) ×2 IMPLANT
TOWEL GREEN STERILE FF (TOWEL DISPOSABLE) ×2 IMPLANT
TRAY CATH INTERMITTENT SS 16FR (CATHETERS) IMPLANT
WATER STERILE IRR 1000ML POUR (IV SOLUTION) IMPLANT
YANKAUER SUCT BULB TIP NO VENT (SUCTIONS) ×1 IMPLANT

## 2024-03-27 NOTE — Progress Notes (Signed)
 Orthopedic Tech Progress Note Patient Details:  Richard Walker 10-26-1955 [id number]  CPM Left Knee Left Knee Flexion (Degrees): 10 Left Knee Extension (Degrees): 40     Richard Walker 03/27/2024, 7:35 PM

## 2024-03-27 NOTE — Progress Notes (Signed)
 Reached out to ortho tech for CPM/bone foam. Ortho tech said this will be placed when patient goes to room.

## 2024-03-27 NOTE — Brief Op Note (Signed)
   03/27/2024  2:45 PM  PATIENT:  Richard Walker  68 y.o. male  PRE-OPERATIVE DIAGNOSIS:  left knee osteoarthritis  POST-OPERATIVE DIAGNOSIS:  left knee osteoarthritis  PROCEDURE:  Procedure(s): ARTHROPLASTY, KNEE, TOTAL, LEFT  SURGEON:  Surgeon(s): Addie, Cordella Hamilton, MD  ASSISTANT: magnant pa  ANESTHESIA:   spinal  EBL: 75 ml    Total I/O In: 1000 [I.V.:900; IV Piggyback:100] Out: 500 [Urine:500]  BLOOD ADMINISTERED: none  DRAINS: none   LOCAL MEDICATIONS USED:  marcaine  morphine  clonidine  vanco  SPECIMEN:  No Specimen  COUNTS:  YES  TOURNIQUET:   Total Tourniquet Time Documented: Thigh (Left) - 74 minutes Total: Thigh (Left) - 74 minutes   DICTATION: .Other Dictation: Dictation Number [id number]  PLAN OF CARE: Admit for overnight observation  PATIENT DISPOSITION:  PACU - hemodynamically stable

## 2024-03-27 NOTE — Anesthesia Procedure Notes (Signed)
 Anesthesia Regional Block: Adductor canal block   Pre-Anesthetic Checklist: , timeout performed,  Correct Patient, Correct Site, Correct Laterality,  Correct Procedure, Correct Position, site marked,  Risks and benefits discussed,  Surgical consent,  Pre-op evaluation,  At surgeon's request and post-op pain management  Laterality: Lower and Left  Prep: chloraprep       Needles:  Injection technique: Single-shot  Needle Type: Echogenic Needle     Needle Length: 9cm  Needle Gauge: 22     Additional Needles:   Procedures:,,,, ultrasound used (permanent image in chart),,    Narrative:  Start time: 03/27/2024 10:26 AM End time: 03/27/2024 10:31 AM Injection made incrementally with aspirations every 5 mL.  Performed by: Personally  Anesthesiologist: Jefm Garnette LABOR, MD  Additional Notes: Block assessed prior to surgery. Pt tolerated procedure well.

## 2024-03-27 NOTE — Progress Notes (Signed)
 Patient took Metformin  at home prior to surgery this am. Anesthesia made aware.

## 2024-03-27 NOTE — H&P (Signed)
 TOTAL KNEE ADMISSION H&P  Patient is being admitted for left total knee arthroplasty.  Subjective:  Chief Complaint:left knee pain.  HPI: Richard Walker, 68 y.o. male, has a history of pain and functional disability in the left knee due to arthritis and has failed non-surgical conservative treatments for greater than 12 weeks to includeNSAID's and/or analgesics, corticosteriod injections, viscosupplementation injections, flexibility and strengthening excercises, and activity modification.  Onset of symptoms was gradual, starting >10 years ago with gradually worsening course since that time. The patient noted no past surgery on the left knee(s).  Patient currently rates pain in the left knee(s) at 9 out of 10 with activity. Patient has night pain, worsening of pain with activity and weight bearing, pain that interferes with activities of daily living, pain with passive range of motion, crepitus, and joint swelling.  Patient has evidence of subchondral sclerosis and joint space narrowing by imaging studies. This patient has had good but very short lasting improvement from injections.. There is no active infection.  No personal or family history of DVT or pulmonary embolism  Patient Active Problem List   Diagnosis Date Noted   Constipation 02/14/2024   Metabolic dysfunction-associated steatotic liver disease (MASLD) 01/13/2024   Abnormal food appetite 11/17/2023   Insulin  resistance 11/17/2023   Class 2 severe obesity due to excess calories with serious comorbidity and body mass index (BMI) of 35.0 to 35.9 in adult (HCC) 11/03/2023   Prediabetes 11/03/2023   Degenerative disc disease, lumbar 12/09/2022   Morbid obesity (HCC) 10/26/2022   Bronchitis 01/29/2022   OSA on CPAP 02/19/2021   Bite, snake 01/28/2021   Neck pain 11/13/2020   Vertigo 11/13/2020   Hypertension 11/03/2020   Contusion of bone 10/23/2020   Degenerative arthritis of knee, bilateral 10/23/2020   Concussion with no loss of  consciousness 10/23/2020   Paroxysmal atrial fibrillation (HCC) 05/22/2019   Unspecified atrial flutter (HCC) 05/22/2019   Pain in right foot 03/30/2018   Perennial and seasonal allergic rhinitis 11/09/2017   History of food allergy 11/09/2017   Bronchitis, mucopurulent recurrent (HCC) 09/07/2017   SVT (supraventricular tachycardia) (HCC) 04/23/2015   Abdominal pain, epigastric 06/03/2013   Vitamin D  deficiency 04/09/2013   Esophageal reflux 04/09/2013   Hepatitis B carrier (HCC) 04/09/2013   Other and unspecified hyperlipidemia 04/09/2013   Sleep apnea 04/09/2013   Past Medical History:  Diagnosis Date   Arthritis    DDD (degenerative disc disease), lumbar    L3/L4   Diverticulosis    Dysrhythmia    SVT s/p ablation   Dysrhythmia    PAF   GERD (gastroesophageal reflux disease)    Hyperlipidemia    Low sperm motility    Myocardial infarction (HCC)    Pre-diabetes    Sleep apnea    uses cpap    SVT (supraventricular tachycardia) (HCC)     Past Surgical History:  Procedure Laterality Date   AMPUTATION Left 07/15/2018   Procedure: . REPAIR OF LEFT INDEX AND LONG FINGER BY FUSION /  REPAIR OF RING FINGER;  Surgeon: Sebastian Lenis, MD;  Location: [facility] OR;  Service: Orthopedics;  Laterality: Left;   BACK SURGERY     BLALOCK PROCEDURE     Ablation   CARDIAC CATHETERIZATION N/A 12/22/2015   Procedure: Left Heart Cath and Coronary Angiography;  Surgeon: Peter M Swaziland, MD;  Location: [facility] INVASIVE CV LAB;  Service: Cardiovascular;  Laterality: N/A;   CARDIAC ELECTROPHYSIOLOGY STUDY AND ABLATION     ELECTROPHYSIOLOGIC STUDY N/A 02/19/2016  Procedure: SVT Ablation;  Surgeon: Danelle LELON Birmingham, MD;  Location: [facility] INVASIVE CV LAB;  Service: Cardiovascular;  Laterality: N/A;   HAND SURGERY     septal reconstruction  2017   TURBINATE REDUCTION Bilateral 07/2016   WISDOM TOOTH EXTRACTION      Current Facility-Administered Medications  Medication Dose Route Frequency Provider Last Rate  Last Admin   ceFAZolin  (ANCEF ) 2-4 GM/100ML-% IVPB            ceFAZolin  (ANCEF ) IVPB 2g/100 mL premix  2 g Intravenous On Call to OR Magnant, Carlin CROME, PA-C       fentaNYL  (SUBLIMAZE ) 100 MCG/2ML injection            lactated ringers  infusion   Intravenous Continuous Jefm Garnette LABOR, MD 10 mL/hr at 03/27/24 0911 New Bag at 03/27/24 0911   midazolam  (VERSED ) 2 MG/2ML injection            povidone-iodine  (BETADINE ) 7.5 % scrub   Topical Once Magnant, Charles L, PA-C       povidone-iodine  10 % swab 2 Application  2 Application Topical Once Magnant, Charles L, PA-C       povidone-iodine  10 % swab 2 Application  2 Application Topical Once Magnant, Charles L, PA-C       tranexamic acid  (CYKLOKAPRON ) 1000MG /139mL IVPB            tranexamic acid  (CYKLOKAPRON ) 2,000 mg in sodium chloride  0.9 % 50 mL Topical Application  2,000 mg Topical To OR Addie, Cordella Hamilton, MD       Allergies  Allergen Reactions   Dust Mite Extract    Pork-Derived Products Swelling   Statins     Muscle aches   Tree Extract Other (See Comments)    Intolerance Grass    Social History   Tobacco Use   Smoking status: Former    Current packs/day: 0.00    Average packs/day: 1 pack/day for 10.0 years (10.0 ttl pk-yrs)    Types: Cigarettes    Start date: 17    Quit date: 7    Years since quitting: 33.6   Smokeless tobacco: Never   Tobacco comments:    Uses Hookah Pipe occasionally  Substance Use Topics   Alcohol use: No    Family History  Problem Relation Age of Onset   Stroke Mother    Heart disease Mother    High Cholesterol Mother    Sleep apnea Father    Hypertension Father    Food Allergy Son    Colon cancer Neg Hx    Stomach cancer Neg Hx    Allergic rhinitis Neg Hx    Angioedema Neg Hx    Asthma Neg Hx    Eczema Neg Hx    Immunodeficiency Neg Hx    Urticaria Neg Hx      Review of Systems  Musculoskeletal:  Positive for arthralgias.  All other systems reviewed and are  negative.   Objective:  Physical Exam Vitals reviewed.  HENT:     Head: Normocephalic.     Nose: Nose normal.     Mouth/Throat:     Mouth: Mucous membranes are moist.  Eyes:     Pupils: Pupils are equal, round, and reactive to light.  Cardiovascular:     Rate and Rhythm: Normal rate.     Pulses: Normal pulses.  Pulmonary:     Effort: Pulmonary effort is normal.  Abdominal:     General: Abdomen is flat.  Musculoskeletal:  Cervical back: Normal range of motion.  Skin:    General: Skin is warm.     Capillary Refill: Capillary refill takes less than 2 seconds.  Neurological:     General: No focal deficit present.     Mental Status: He is alert.  Psychiatric:        Mood and Affect: Mood normal.   Ortho exam demonstrates mild varus alignment bilateral knees. Trace effusion present. Pedal pulses palpable. Ankle dorsiflexion intact. No groin pain with internal or external rotation of the leg. No masses lymphadenopathy or skin changes noted in that left knee region. Range of motion is about 5-1 05. That is in both knees. End-stage arthritis is present in both knees radiographically as well.   Vital signs in last 24 hours: Temp:  [97.9 F (36.6 C)] 97.9 F (36.6 C) (08/05 0842) Pulse Rate:  [53] 53 (08/05 0842) Resp:  [19] 19 (08/05 0842) BP: (117)/(74) 117/74 (08/05 0842) SpO2:  [97 %] 97 % (08/05 0842) Weight:  [109.8 kg] 109.8 kg (08/05 0842)  Labs:   Estimated body mass index is 36.8 kg/m as calculated from the following:   Height as of this encounter: 5' 8 (1.727 m).   Weight as of this encounter: 109.8 kg.   Imaging Review Plain radiographs demonstrate severe degenerative joint disease of the left knee(s). The overall alignment ismild varus. The bone quality appears to be good for age and reported activity level.      Assessment/Plan:  End stage arthritis, left knee   The patient history, physical examination, clinical judgment of the provider and  imaging studies are consistent with end stage degenerative joint disease of the left knee(s) and total knee arthroplasty is deemed medically necessary. The treatment options including medical management, injection therapy arthroscopy and arthroplasty were discussed at length. The risks and benefits of total knee arthroplasty were presented and reviewed. The risks due to aseptic loosening, infection, stiffness, patella tracking problems, thromboembolic complications and other imponderables were discussed. The patient acknowledged the explanation, agreed to proceed with the plan and consent was signed. Patient is being admitted for inpatient treatment for surgery, pain control, PT, OT, prophylactic antibiotics, VTE prophylaxis, progressive ambulation and ADL's and discharge planning. The patient is planning to be discharged home with home health services     Patient's anticipated LOS is less than 2 midnights, meeting these requirements: - Younger than 42 - Lives within 1 hour of care - Has a competent adult at home to recover with post-op recover - NO history of  - Chronic pain requiring opiods  - Diabetes  - Coronary Artery Disease  - Heart failure  - Heart attack  - Stroke  - DVT/VTE  - Cardiac arrhythmia  - Respiratory Failure/COPD  - Renal failure  - Anemia  - Advanced Liver disease

## 2024-03-27 NOTE — Anesthesia Procedure Notes (Signed)
 Spinal  Patient location during procedure: OR Start time: 03/27/2024 11:43 AM End time: 03/27/2024 11:48 AM Reason for block: surgical anesthesia Staffing Performed: anesthesiologist  Anesthesiologist: Jefm Garnette LABOR, MD Performed by: Jefm Garnette LABOR, MD Authorized by: Jefm Garnette LABOR, MD   Preanesthetic Checklist Completed: patient identified, IV checked, risks and benefits discussed, surgical consent, monitors and equipment checked, pre-op evaluation and timeout performed Spinal Block Patient position: sitting Prep: DuraPrep and site prepped and draped Patient monitoring: heart rate, cardiac monitor, continuous pulse ox and blood pressure Approach: midline Location: L3-4 Injection technique: single-shot Needle Needle type: Pencan  Needle gauge: 24 G Needle length: 10 cm Needle insertion depth: 7 cm Assessment Sensory level: T4 Events: CSF return Additional Notes  1 Attempt (s). Pt tolerated procedure well.

## 2024-03-27 NOTE — Anesthesia Postprocedure Evaluation (Signed)
 Anesthesia Post Note  Patient: Richard Walker  Procedure(s) Performed: ARTHROPLASTY, KNEE, TOTAL, LEFT (Left: Knee)     Patient location during evaluation: Nursing Unit Anesthesia Type: Spinal and Regional Level of consciousness: oriented and awake and alert Pain management: pain level controlled Vital Signs Assessment: post-procedure vital signs reviewed and stable Respiratory status: spontaneous breathing and respiratory function stable Cardiovascular status: blood pressure returned to baseline and stable Postop Assessment: no headache, no backache, no apparent nausea or vomiting and patient able to bend at knees Anesthetic complications: no   No notable events documented.  Last Vitals:  Vitals:   03/27/24 1530 03/27/24 1545  BP: 95/66 107/68  Pulse: (!) 57 (!) 54  Resp: 16 20  Temp:    SpO2: 98% 98%    Last Pain:  Vitals:   03/27/24 1624  TempSrc:   PainSc: 8                  Garnette DELENA Gab

## 2024-03-27 NOTE — Transfer of Care (Signed)
 Immediate Anesthesia Transfer of Care Note  Patient: Richard Walker  Procedure(s) Performed: ARTHROPLASTY, KNEE, TOTAL, LEFT (Left: Knee)  Patient Location: PACU  Anesthesia Type:MAC and Spinal  Level of Consciousness: awake, alert , and oriented  Airway & Oxygen Therapy: Patient Spontanous Breathing  Post-op Assessment: Report given to RN and Post -op Vital signs reviewed and stable  Post vital signs: Reviewed and stable  Last Vitals:  Vitals Value Taken Time  BP 99/66 03/27/24 14:45  Temp 36.6 C 03/27/24 14:42  Pulse 59 03/27/24 14:53  Resp 15 03/27/24 14:53  SpO2 95 % 03/27/24 14:53  Vitals shown include unfiled device data.  Last Pain:  Vitals:   03/27/24 1037  TempSrc:   PainSc: 0-No pain      Patients Stated Pain Goal: 0 (03/27/24 0859)  Complications: No notable events documented.

## 2024-03-28 ENCOUNTER — Encounter (HOSPITAL_COMMUNITY): Payer: Self-pay | Admitting: Orthopedic Surgery

## 2024-03-28 MED ORDER — HYDROMORPHONE HCL 1 MG/ML IJ SOLN
1.0000 mg | INTRAMUSCULAR | Status: AC | PRN
  Administered 2024-03-28 – 2024-03-29 (×3): 1 mg via INTRAVENOUS
  Filled 2024-03-28 (×3): qty 1

## 2024-03-28 NOTE — Care Management Obs Status (Signed)
 MEDICARE OBSERVATION STATUS NOTIFICATION   Patient Details  Name: Richard Walker MRN: [id number] Date of Birth: 09-25-1955   Medicare Observation Status Notification Given:  Yes    Jon Cruel 03/28/2024, 12:21 PM

## 2024-03-28 NOTE — Progress Notes (Signed)
 Orthopedic Tech Progress Note Patient Details:  Richard Walker [birth year] [id number]  Ortho Devices Type of Ortho Device: Bone foam zero knee Ortho Device/Splint Location: LLE Ortho Device/Splint Interventions: Application   Post Interventions Patient Tolerated: Well  Massie BRAVO Myking Sar 03/28/2024, 8:13 PM

## 2024-03-28 NOTE — Evaluation (Signed)
 Physical Therapy Evaluation Patient Details Name: Richard Walker MRN: [id number] DOB: 07-27-1956 Today's Date: 03/28/2024  History of Present Illness  Richard Walker is 68 y.o. male who presented for elective left total knee arthroplasty 03/27/24. PMHx: OA, DDD, GERD, HLD, dysrhythmia, MI, and SVT.  Clinical Impression  Pt admitted with above diagnosis. PTA, pt was independent with functional mobility, ADLs, and IADLs. He lives with his children in a three story house with 3 STE and 15 steps to his bedroom and full bathroom. Pt currently with functional limitations due to the deficits listed below (see PT Problem List). He required minA for bed mobility, CGA for transfer using RW, and CGA for gait using RW. Pt ambulated a short distance within his room demonstrating an antalgic gait pattern. He opted to maintain LLE NWB to TDWB d/t pain with heavy reliance on BUE support on RW. Educated pt on L TKA HEP and provided him with handout. Pt will benefit from acute skilled PT to increase his independence and safety with mobility to allow discharge.      If plan is discharge home, recommend the following: A little help with walking and/or transfers;A little help with bathing/dressing/bathroom;Assistance with cooking/housework;Assist for transportation;Help with stairs or ramp for entrance   Can travel by private vehicle        Equipment Recommendations BSC/3in1  Recommendations for Other Services       Functional Status Assessment Patient has had a recent decline in their functional status and demonstrates the ability to make significant improvements in function in a reasonable and predictable amount of time.     Precautions / Restrictions Precautions Precautions: Knee;Fall Precaution Booklet Issued: Yes (comment) Recall of Precautions/Restrictions: Intact Required Braces or Orthoses: Knee Immobilizer - Left Restrictions Weight Bearing Restrictions Per Provider Order: Yes LLE Weight Bearing Per  Provider Order: Weight bearing as tolerated      Mobility  Bed Mobility Overal bed mobility: Needs Assistance Bed Mobility: Supine to Sit     Supine to sit: Min assist, HOB elevated, Used rails     General bed mobility comments: Pt sat up on R side of bed with increased time. Assist to manage LLE. Pt elevated trunk and scooted fwd with use of bedrail.    Transfers Overall transfer level: Needs assistance Equipment used: Rolling walker (2 wheels) Transfers: Sit to/from Stand, Bed to chair/wheelchair/BSC Sit to Stand: Contact guard assist   Step pivot transfers: Contact guard assist       General transfer comment: Pt stood from lowest bed height. Cued proper hand placement using RW. Pt maintained LLE extended out in front of him. Educated on the use of momentum to power up. Transferred to recliner chair on right. Good eccentric control.    Ambulation/Gait Ambulation/Gait assistance: Contact guard assist Gait Distance (Feet): 6 Feet Assistive device: Rolling walker (2 wheels) Gait Pattern/deviations: Step-to pattern, Decreased step length - right, Decreased step length - left, Decreased stance time - left, Decreased dorsiflexion - left, Decreased weight shift to left, Antalgic Gait velocity: decreased     General Gait Details: Pt ambulated with short slow steps. Heavy reliance on BUE support on RW to offload LEs. Pt demonstrated NWB to TDWB on LLE d/t pain. He didn't allow flat foot contact with the ground. Good proximity to RW. No LOB.  Stairs            Wheelchair Mobility     Tilt Bed    Modified Rankin (Stroke Patients Only)  Balance Overall balance assessment: Needs assistance Sitting-balance support: No upper extremity supported, Feet supported Sitting balance-Leahy Scale: Good Sitting balance - Comments: Pt sat EOB with supervision.   Standing balance support: Bilateral upper extremity supported, During functional activity, Reliant on assistive  device for balance Standing balance-Leahy Scale: Poor Standing balance comment: Pt dependent on RW.                             Pertinent Vitals/Pain Pain Assessment Pain Assessment: 0-10 Pain Score: 8  Pain Location: L knee Pain Descriptors / Indicators: Grimacing, Guarding, Discomfort, Aching Pain Intervention(s): Monitored during session, Limited activity within patient's tolerance, Repositioned, Patient requesting pain meds-RN notified    Home Living Family/patient expects to be discharged to:: Private residence Living Arrangements: Children (Son 10; Daughter 22) Available Help at Discharge: Family;Available PRN/intermittently Type of Home: House Home Access: Stairs to enter Entrance Stairs-Rails: None Entrance Stairs-Number of Steps: 3 Alternate Level Stairs-Number of Steps: 15 Home Layout: Multi-level;Bed/bath upstairs;1/2 bath on main level Home Equipment: Rolling Walker (2 wheels)      Prior Function Prior Level of Function : Independent/Modified Independent;Driving             Mobility Comments: Ambulates without AD. Denies falls. ADLs Comments: Indep with ADLs/IADLs. Retired. Reports he drives if he has to, but usually has family for that.     Extremity/Trunk Assessment   Upper Extremity Assessment Upper Extremity Assessment: Overall WFL for tasks assessed    Lower Extremity Assessment Lower Extremity Assessment: LLE deficits/detail (RLE overall WFL for tasks assessed.) LLE Deficits / Details: Limited AROM d/t pain. Pt in knee immobilizer. Grossly 2+/5 strength. LLE: Unable to fully assess due to pain;Unable to fully assess due to immobilization LLE Sensation: WNL LLE Coordination: decreased gross motor    Cervical / Trunk Assessment Cervical / Trunk Assessment: Normal  Communication   Communication Communication: No apparent difficulties    Cognition Arousal: Alert Behavior During Therapy: WFL for tasks assessed/performed   PT -  Cognitive impairments: No apparent impairments                       PT - Cognition Comments: Pt A,Ox4 Following commands: Intact       Cueing Cueing Techniques: Verbal cues     General Comments General comments (skin integrity, edema, etc.): LLE edema    Exercises Other Exercises Other Exercises: Educated pt on L TKA HEP. Provided pt with handout. Demonstrated and described each exercise.   Assessment/Plan    PT Assessment Patient needs continued PT services  PT Problem List Decreased strength;Decreased range of motion;Decreased activity tolerance;Decreased balance;Decreased mobility;Decreased knowledge of use of DME;Pain       PT Treatment Interventions DME instruction;Gait training;Stair training;Functional mobility training;Therapeutic activities;Therapeutic exercise;Balance training;Neuromuscular re-education;Patient/family education    PT Goals (Current goals can be found in the Care Plan section)  Acute Rehab PT Goals Patient Stated Goal: Have less pain and Return Home PT Goal Formulation: With patient Time For Goal Achievement: 04/04/24 Potential to Achieve Goals: Good    Frequency 7X/week     Co-evaluation               AM-PAC PT 6 Clicks Mobility  Outcome Measure Help needed turning from your back to your side while in a flat bed without using bedrails?: A Little Help needed moving from lying on your back to sitting on the side of a flat bed without using  bedrails?: A Little Help needed moving to and from a bed to a chair (including a wheelchair)?: A Little Help needed standing up from a chair using your arms (e.g., wheelchair or bedside chair)?: A Little Help needed to walk in hospital room?: A Little Help needed climbing 3-5 steps with a railing? : A Lot 6 Click Score: 17    End of Session Equipment Utilized During Treatment: Gait belt;Left knee immobilizer Activity Tolerance: Patient tolerated treatment well;Patient limited by  pain Patient left: in chair;with call bell/phone within reach;with chair alarm set Nurse Communication: Mobility status;Patient requests pain meds PT Visit Diagnosis: Difficulty in walking, not elsewhere classified (R26.2);Pain;Unsteadiness on feet (R26.81);Other abnormalities of gait and mobility (R26.89) Pain - Right/Left: Left Pain - part of body: Knee    Time: 0805-0827 PT Time Calculation (min) (ACUTE ONLY): 22 min   Charges:   PT Evaluation $PT Eval Moderate Complexity: 1 Mod   PT General Charges $$ ACUTE PT VISIT: 1 Visit         Randall SAUNDERS, PT, DPT Acute Rehabilitation Services Office: [phone] Secure Chat Preferred  Delon CHRISTELLA Callander 03/28/2024, 9:09 AM

## 2024-03-28 NOTE — Progress Notes (Signed)
 RN called and informed ortho tech of need for Zero degree knee foam. Per ortho tech, will deliver to bedside shortly.

## 2024-03-28 NOTE — Progress Notes (Signed)
 Physical Therapy Treatment Patient Details Name: Richard Walker MRN: [id number] DOB: May 06, 1956 Today's Date: 03/28/2024   History of Present Illness Richard Walker is 68 y.o. male who presented for elective left total knee arthroplasty 03/27/24. PMHx: OA, DDD, GERD, HLD, dysrhythmia, MI, and SVT.    PT Comments  Pt greeted returning to bed after using the bathroom with NT. He declined ambulation d/t worsening pain and onset of nausea. RN notified of pt's request for pain medicine. Assisted pt with sit>supine managing LLE with minA. Educated pt/family on knee precautions, positioning considerations, L TKA HEP, ice application, and CPM use. Will continue to follow acutely and advance appropriately.      If plan is discharge home, recommend the following: A little help with walking and/or transfers;A little help with bathing/dressing/bathroom;Assistance with cooking/housework;Assist for transportation;Help with stairs or ramp for entrance   Can travel by private vehicle        Equipment Recommendations  BSC/3in1    Recommendations for Other Services       Precautions / Restrictions Precautions Precautions: Knee;Fall Recall of Precautions/Restrictions: Impaired Precaution/Restrictions Comments: Reviewed positionining considerations with emphasis on extension and no pillow under his knee or bend in the bed. Discussed zero degree knee boam foam, which wasn't present yet in room. RN aware. Pt greeted with KI off. Required Braces or Orthoses: Knee Immobilizer - Left Restrictions Weight Bearing Restrictions Per Provider Order: Yes LLE Weight Bearing Per Provider Order: Weight bearing as tolerated     Mobility  Bed Mobility Overal bed mobility: Needs Assistance Bed Mobility: Sit to Supine     Supine to sit: Min assist, HOB elevated, Used rails Sit to supine: Min assist   General bed mobility comments: Pt seated on R side of bed. Cued pt on proper sequencing. Assist to bring BLE back into  bed and reposition LLE into full extension.    Transfers Overall transfer level: Needs assistance Equipment used: Rolling walker (2 wheels) Transfers: Sit to/from Stand Sit to Stand: Contact guard assist   Step pivot transfers: Contact guard assist       General transfer comment: Entered room as pt was sitting down on bed after returning from the bathroom with NT.    Ambulation/Gait Ambulation/Gait assistance: Contact guard assist Gait Distance (Feet): 6 Feet Assistive device: Rolling walker (2 wheels) Gait Pattern/deviations: Step-to pattern, Decreased step length - right, Decreased step length - left, Decreased stance time - left, Decreased dorsiflexion - left, Decreased weight shift to left, Antalgic Gait velocity: decreased     General Gait Details: Pt declined ambulation d/t worsening pain and onset of nausea.   Stairs             Wheelchair Mobility     Tilt Bed    Modified Rankin (Stroke Patients Only)       Balance Overall balance assessment: Needs assistance Sitting-balance support: No upper extremity supported, Feet supported Sitting balance-Leahy Scale: Good Sitting balance - Comments: Pt sat EOB with supervision.   Standing balance support: Bilateral upper extremity supported, During functional activity, Reliant on assistive device for balance Standing balance-Leahy Scale: Poor Standing balance comment: Pt dependent on RW.                            Communication Communication Communication: No apparent difficulties  Cognition Arousal: Alert Behavior During Therapy: WFL for tasks assessed/performed   PT - Cognitive impairments: No apparent impairments  PT - Cognition Comments: Pt A,Ox4 Following commands: Intact      Cueing Cueing Techniques: Verbal cues  Exercises Other Exercises Other Exercises: Educated pt on L TKA HEP. Provided pt with handout. Demonstrated and described each exercise.     General Comments General comments (skin integrity, edema, etc.): Educated pt/family: knee positioning considerations, R TKA HEP,  and icing.      Pertinent Vitals/Pain Pain Assessment Pain Assessment: 0-10 Pain Score: 10-Worst pain ever Pain Location: L knee Pain Descriptors / Indicators: Grimacing, Guarding, Moaning, Discomfort Pain Intervention(s): Monitored during session, Limited activity within patient's tolerance, Ice applied, Repositioned, Patient requesting pain meds-RN notified    Home Living                          Prior Function            PT Goals (current goals can now be found in the care plan section) Acute Rehab PT Goals Patient Stated Goal: Pain to get better controlled so I can return home Progress towards PT goals: Not progressing toward goals - comment (limited by pain)    Frequency    7X/week      PT Plan      Co-evaluation              AM-PAC PT 6 Clicks Mobility   Outcome Measure  Help needed turning from your back to your side while in a flat bed without using bedrails?: A Little Help needed moving from lying on your back to sitting on the side of a flat bed without using bedrails?: A Little Help needed moving to and from a bed to a chair (including a wheelchair)?: A Little Help needed standing up from a chair using your arms (e.g., wheelchair or bedside chair)?: A Little Help needed to walk in hospital room?: A Lot Help needed climbing 3-5 steps with a railing? : A Lot 6 Click Score: 16    End of Session Equipment Utilized During Treatment: Gait belt Activity Tolerance: Patient limited by pain Patient left: in bed;with bed alarm set;with family/visitor present Nurse Communication: Mobility status;Patient requests pain meds (need for Zero degree knee bone foam) PT Visit Diagnosis: Difficulty in walking, not elsewhere classified (R26.2);Pain;Unsteadiness on feet (R26.81);Other abnormalities of gait and mobility  (R26.89) Pain - Right/Left: Left Pain - part of body: Knee     Time: 1435-1446 PT Time Calculation (min) (ACUTE ONLY): 11 min  Charges:    $Therapeutic Activity: 8-22 mins PT General Charges $$ ACUTE PT VISIT: 1 Visit                     Randall SAUNDERS, PT, DPT Acute Rehabilitation Services Office: [phone] Secure Chat Preferred  Richard Walker 03/28/2024, 3:24 PM

## 2024-03-28 NOTE — Op Note (Unsigned)
 NAMECLIFORD, Richard Walker MEDICAL RECORD NO: [id number] ACCOUNT NO: [id number] DATE OF BIRTH: [birth year] FACILITY: [facility] LOCATION: [facility] PHYSICIAN: Cordella RAMAN. Addie, MD  Operative Report   PREOPERATIVE DIAGNOSIS:  Left knee arthritis.  POSTOPERATIVE DIAGNOSIS:  Left knee arthritis.  PROCEDURE:  Left total knee replacement using Persona press-fit component size 9 standard femur, size F spiked keel tibia, 38 mm 3 peg patella with 10-mm medial congruent highly cross-linked polyethylene.  SURGEON:  Cordella RAMAN. Addie, MD.  ASSISTANT:  Herlene Calix, PA.  INDICATIONS:  The patient is a 68 year old with left knee pain and end-stage arthritis refractory to nonoperative management.  He presents now for operative management.  DESCRIPTION OF PROCEDURE:  After explanation of the risks and benefits of the procedure in detail, the patient was brought to the operating room where a spinal anesthetic was induced.  Preoperative antibiotics were administered and a timeout was called.  The left leg was prescrubbed with alcohol and Betadine , allowed to air dry and then prepped with DuraPrep solution and draped in a sterile manner.  Ioban was used to cover the operative field. After calling timeout, the patient's leg was elevated and  exsanguinated with the Esmarch wrap.  The tourniquet was inflated.  An anterior approach to the knee was made.  IrriSept solution was utilized.  A median parapatellar arthrotomy was made and marked with #1 Vicryl suture.  IrriSept solution was utilized  here as well.  The fat pad was partially removed.  Medial soft tissue dissection was performed proportional to the patient's preop varus deformity, which was moderate. Soft tissue was removed from the anterior distal femur.  Lateral patellofemoral  ligament was released. Patella everted, knee flexed. Osteophytes were removed from the distal femur and the medial tibia.  The ACL was released.  Anterior horn of the lateral meniscus was released.  A  Hohmann retractor was placed.  Intramedullary  alignment was used to make a cut off of the proximal tibia measuring about 2-3 mm off that most affected medial tibial plateau in order to get below the sclerotic bone. Bone quality was excellent. Intramedullary alignment was then used to cut the femur  in 5 degrees of valgus. A 10-mm cut was made.  This allowed a 10-mm spacer to fit nicely within the extension gap.  The femur was then sized to a size 9.  Anterior, posterior, and chamfer cuts were made in 3 degrees of external rotation, which gave both  symmetric flexion and extension gaps. Bone quality was excellent on the femur. Next, the tibial plateau was sized and a base plate was placed in line with the medial third of the tibial tubercle.  A femoral trial was placed.  A 10 spacer was placed,  which gave very good extension and flexion gaps with no lift-off. The patella was then cut down from 26 to 15.5 mm.  The patella trial button was placed and with all trial components in place, the patient had full extension, full flexion with no  lift-off, and excellent patellar tracking using no thumb technique. Final preparations were then performed on both the femur and the tibia.  The trial components were removed. Pulsatile irrigation x3 liters was utilized.  The capsule was anesthetized  using a solution of Marcaine , saline, and Exparel .  Next, we placed a tranexamic acid  sponge along with IrriSept into the knee for 3 minutes. This was removed.  A femoral bone plug was placed.  IrriSept solution was used to irrigate out the tibial canal,  that  was suctioned out and then vancomycin  powder was placed.  We then tapped in the press-fit component tibia followed by the femur and then placed in the 10-mm spacer with excellent press-fit obtained.  Patella was then placed with excellent  stability. Same stability parameters were maintained with good varus and valgus stability to 0, 30, and 90 degrees. Tourniquet was  released.  Bleeding points encountered controlled using electrocautery. The median parapatellar arthrotomy was closed using  #1 Vicryl suture.  Prior to final closure of the arthrotomy, we did irrigate the knee out again using IrriSept solution, suctioned that out, and then placed in some vancomycin  powder into the knee joint.  Then, the arthrotomy was closed.  Knee joint was  then injected with Marcaine , morphine , and clonidine . The incision was then closed using 0 Vicryl suture, 2-0 Vicryl suture, and 3-0 Monocryl with Steri-Strips, Aquacel dressing, Ace wrap, and knee immobilizer placed. The patient tolerated the procedure  well without immediate complications.  Luke's assistance was required at all times for retraction, opening and closing, and mobilization of tissue.  His assistance was a medical necessity.   NIK D: 03/27/2024 2:52:29 pm T: 03/28/2024 2:32:00 am  JOB: [id number]/ [id number]

## 2024-03-28 NOTE — Progress Notes (Signed)
  Subjective: Richard Walker is a 68 y.o. male s/p 1 TKA.  They are POD1.  Pt's pain is moderate but controlled.  Pt denies any complain of chest pain, shortness of breath, abdominal pain, calf pain.  Patient denies any fevers or chills. Was able to ambulate to bathroom  Objective: Vital signs in last 24 hours: Temp:  [97.5 F (36.4 C)-97.9 F (36.6 C)] 97.9 F (36.6 C) (08/06 0740) Pulse Rate:  [49-63] 62 (08/06 0740) Resp:  [11-23] 17 (08/06 0740) BP: (88-120)/(58-85) 105/63 (08/06 0740) SpO2:  [92 %-100 %] 98 % (08/06 0740) FiO2 (%):  [21 %] 21 % (08/05 2127) Weight:  [109.8 kg] 109.8 kg (08/05 0842)  Intake/Output from previous day: 08/05 0701 - 08/06 0700 In: 1100 [I.V.:1000; IV Piggyback:100] Out: 505 [Urine:500; Blood:5] Intake/Output this shift: No intake/output data recorded.  Exam:  No gross blood or drainage overlying the dressing 2+ DP pulse Able to dorsiflex and plantarflex the operative foot No calf tenderness.  Negative Homans' sign. Unable to perform straight leg raise   Labs: No results for input(s): HGB in the last 72 hours. No results for input(s): WBC, RBC, HCT, PLT in the last 72 hours. No results for input(s): NA, K, CL, CO2, BUN, CREATININE, GLUCOSE, CALCIUM  in the last 72 hours. No results for input(s): LABPT, INR in the last 72 hours.  Assessment/Plan: Pt is POD1 s/p TKA.    -Plan to discharge to home in coming days pending patient's pain and PT eval  -WBAT with a walker  -Follow-up with Dr. Addie in clinic 2 weeks postoperatively    [facility] 03/28/2024, 7:58 AM

## 2024-03-29 DIAGNOSIS — Z96652 Presence of left artificial knee joint: Secondary | ICD-10-CM

## 2024-03-29 DIAGNOSIS — Z823 Family history of stroke: Secondary | ICD-10-CM | POA: Diagnosis not present

## 2024-03-29 DIAGNOSIS — E785 Hyperlipidemia, unspecified: Secondary | ICD-10-CM | POA: Diagnosis present

## 2024-03-29 DIAGNOSIS — Z9109 Other allergy status, other than to drugs and biological substances: Secondary | ICD-10-CM | POA: Diagnosis not present

## 2024-03-29 DIAGNOSIS — Z981 Arthrodesis status: Secondary | ICD-10-CM | POA: Diagnosis not present

## 2024-03-29 DIAGNOSIS — G4733 Obstructive sleep apnea (adult) (pediatric): Secondary | ICD-10-CM | POA: Diagnosis present

## 2024-03-29 DIAGNOSIS — M1712 Unilateral primary osteoarthritis, left knee: Secondary | ICD-10-CM | POA: Diagnosis present

## 2024-03-29 DIAGNOSIS — Z6836 Body mass index (BMI) 36.0-36.9, adult: Secondary | ICD-10-CM | POA: Diagnosis not present

## 2024-03-29 DIAGNOSIS — Z91014 Allergy to mammalian meats: Secondary | ICD-10-CM | POA: Diagnosis not present

## 2024-03-29 DIAGNOSIS — Z888 Allergy status to other drugs, medicaments and biological substances status: Secondary | ICD-10-CM | POA: Diagnosis not present

## 2024-03-29 DIAGNOSIS — Z87891 Personal history of nicotine dependence: Secondary | ICD-10-CM | POA: Diagnosis not present

## 2024-03-29 DIAGNOSIS — I480 Paroxysmal atrial fibrillation: Secondary | ICD-10-CM | POA: Diagnosis present

## 2024-03-29 DIAGNOSIS — Z8249 Family history of ischemic heart disease and other diseases of the circulatory system: Secondary | ICD-10-CM | POA: Diagnosis not present

## 2024-03-29 DIAGNOSIS — K219 Gastro-esophageal reflux disease without esophagitis: Secondary | ICD-10-CM | POA: Diagnosis present

## 2024-03-29 DIAGNOSIS — I252 Old myocardial infarction: Secondary | ICD-10-CM | POA: Diagnosis not present

## 2024-03-29 DIAGNOSIS — Z7984 Long term (current) use of oral hypoglycemic drugs: Secondary | ICD-10-CM | POA: Diagnosis not present

## 2024-03-29 DIAGNOSIS — Z8349 Family history of other endocrine, nutritional and metabolic diseases: Secondary | ICD-10-CM | POA: Diagnosis not present

## 2024-03-29 DIAGNOSIS — E66812 Obesity, class 2: Secondary | ICD-10-CM | POA: Diagnosis present

## 2024-03-29 DIAGNOSIS — M51369 Other intervertebral disc degeneration, lumbar region without mention of lumbar back pain or lower extremity pain: Secondary | ICD-10-CM | POA: Diagnosis present

## 2024-03-29 DIAGNOSIS — E119 Type 2 diabetes mellitus without complications: Secondary | ICD-10-CM | POA: Diagnosis present

## 2024-03-29 MED ORDER — FLECAINIDE ACETATE 50 MG PO TABS
150.0000 mg | ORAL_TABLET | Freq: Two times a day (BID) | ORAL | Status: DC
  Administered 2024-03-29 – 2024-04-01 (×6): 150 mg via ORAL
  Filled 2024-03-29 (×6): qty 3

## 2024-03-29 NOTE — Progress Notes (Signed)
 Physical Therapy Treatment Patient Details Name: Richard Walker MRN: [id number] DOB: 19-May-1956 Today's Date: 03/29/2024   History of Present Illness Richard Walker is 68 y.o. male who presented for elective left total knee arthroplasty 03/27/24. PMHx: OA, DDD, GERD, HLD, dysrhythmia, MI, and SVT.    PT Comments  Pt resting in bed on arrival, pleasant and agreeable to session with encouragement and education on importance for frequent mobilization. Pt continues to be limited by LLE pain, limiting weight bearing and standing tolerance. Pt needing min A to complete bed mobility with assist to manage LLE throughout. Pt with recall for hand placement for transfers to stand and able to complete with increased time with CGA for safety. Pt demonstrating short in room ambulation with RW for support and CGA for safety. Initiated stair training with pt ascending/descending x2 steps with min A to steady and max verbal and tactile cues for sequencing. Provided handout for ascending/descending steps with RW and no rail backwards with pt requesting to not practice today, will plan to practice in next session for increased safety post acutely. Pt continues to benefit from skilled PT services to progress toward functional mobility goals.     If plan is discharge home, recommend the following: A little help with walking and/or transfers;A little help with bathing/dressing/bathroom;Assistance with cooking/housework;Assist for transportation;Help with stairs or ramp for entrance   Can travel by private vehicle        Equipment Recommendations  BSC/3in1    Recommendations for Other Services       Precautions / Restrictions Precautions Precautions: Knee;Fall Recall of Precautions/Restrictions: Impaired Precaution/Restrictions Comments: Pt greeted with knees of bed elevated. Reviewed the importance of maintaining full extension with FOB flat. Restrictions Weight Bearing Restrictions Per Provider Order: Yes LLE  Weight Bearing Per Provider Order: Weight bearing as tolerated     Mobility  Bed Mobility Overal bed mobility: Needs Assistance Bed Mobility: Supine to Sit     Supine to sit: Min assist, HOB elevated, Used rails     General bed mobility comments: Educated pt on use of gait belt to support LLE and aid in management. He required intermittent minA to support LLE. Cues for sequencing. Pt pulled on bed rail to elevate trunk and scoot hips fwd.    Transfers Overall transfer level: Needs assistance Equipment used: Rolling walker (2 wheels) Transfers: Sit to/from Stand Sit to Stand: From elevated surface, Min assist, Contact guard assist           General transfer comment: Pt stood from raised bed height. increased time needed to elevate trunk to upright standing,    Ambulation/Gait Ambulation/Gait assistance: Contact guard assist, +2 safety/equipment (chair follow) Gait Distance (Feet): 15 Feet Assistive device: Rolling walker (2 wheels) Gait Pattern/deviations: Step-to pattern, Decreased step length - right, Decreased step length - left, Decreased stance time - left, Decreased dorsiflexion - left, Decreased weight shift to left, Antalgic, Knee flexed in stance - left Gait velocity: decreased     General Gait Details: Pt ambulated with short, slow laborious steps. Heavy reliance on BUE support on RW. Intermittent standing rest breaks d/t fatigue. Pt self-limited WBing on LLE d/t pain. He made foot flat contact. Pt manuevered RW well within room with good proximity to AD. Limited hip/knee flex on LLE.   Stairs Stairs: Yes Stairs assistance: Min assist Stair Management: Two rails, Step to pattern, Forwards, Backwards Number of Stairs: 2 General stair comments: pt stepping up with RLE with step to pattern with heavy reliance  on UE on rails, pt backing down steps with tactile  cues needed for sequencing, reviewed ascendign staeps backwards with RW and second person support as pt  without raisl at home, also provided handout, will plan to pracice this tomorrow per pt tolerance   Wheelchair Mobility     Tilt Bed    Modified Rankin (Stroke Patients Only)       Balance Overall balance assessment: Needs assistance Sitting-balance support: No upper extremity supported, Feet supported Sitting balance-Leahy Scale: Good     Standing balance support: Bilateral upper extremity supported, During functional activity, Reliant on assistive device for balance Standing balance-Leahy Scale: Poor Standing balance comment: Pt dependent on RW.                            Communication Communication Communication: No apparent difficulties  Cognition Arousal: Alert Behavior During Therapy: WFL for tasks assessed/performed   PT - Cognitive impairments: No apparent impairments                         Following commands: Intact      Cueing Cueing Techniques: Verbal cues  Exercises      General Comments        Pertinent Vitals/Pain Pain Assessment Pain Assessment: Faces Faces Pain Scale: Hurts even more Pain Location: L knee Pain Descriptors / Indicators: Grimacing, Operative site guarding, Discomfort Pain Intervention(s): Premedicated before session, Monitored during session, Limited activity within patient's tolerance    Home Living                          Prior Function            PT Goals (current goals can now be found in the care plan section) Acute Rehab PT Goals Patient Stated Goal: Have less pain and tolerate moving better so I can safely return home. PT Goal Formulation: With patient Potential to Achieve Goals: Good Progress towards PT goals: Progressing toward goals (slowly)    Frequency    7X/week      PT Plan      Co-evaluation              AM-PAC PT 6 Clicks Mobility   Outcome Measure  Help needed turning from your back to your side while in a flat bed without using bedrails?: A  Little Help needed moving from lying on your back to sitting on the side of a flat bed without using bedrails?: A Little Help needed moving to and from a bed to a chair (including a wheelchair)?: A Little Help needed standing up from a chair using your arms (e.g., wheelchair or bedside chair)?: A Little Help needed to walk in hospital room?: Total (<20') Help needed climbing 3-5 steps with a railing? : Total 6 Click Score: 14    End of Session Equipment Utilized During Treatment: Gait belt Activity Tolerance: Patient tolerated treatment well;Patient limited by pain;Patient limited by fatigue Patient left: in chair;with call bell/phone within reach;with family/visitor present Nurse Communication: Mobility status;Patient requests pain meds PT Visit Diagnosis: Difficulty in walking, not elsewhere classified (R26.2);Pain;Unsteadiness on feet (R26.81);Other abnormalities of gait and mobility (R26.89) Pain - Right/Left: Left Pain - part of body: Knee     Time: 1345-1418 PT Time Calculation (min) (ACUTE ONLY): 33 min  Charges:    $Gait Training: 23-37 mins PT General Charges $$ ACUTE PT VISIT: 1 Visit  Therisa SAUNDERS. PTA Acute Rehabilitation Services Office: [phone]   Therisa CHRISTELLA Boor 03/29/2024, 3:58 PM

## 2024-03-29 NOTE — Progress Notes (Addendum)
  Subjective: Richard Walker is a 68 y.o. male s/p left TKA.  They are POD2.  Pt's pain is controlled.  Slightly worse than yesterday.  Pt denies any complain of chest pain, shortness of breath, abdominal pain, calf pain.  Patient denies any fevers or chills. Struggled to mobilize with PT yesterday, ambulating 6 feet.  No bowel movement yet.  Objective: Vital signs in last 24 hours: Temp:  [97.9 F (36.6 C)-98.5 F (36.9 C)] 98 F (36.7 C) (08/07 0342) Pulse Rate:  [62-97] 64 (08/07 0342) Resp:  [17-18] 17 (08/07 0342) BP: (103-125)/(60-75) 125/75 (08/07 0342) SpO2:  [95 %-100 %] 95 % (08/07 0342) FiO2 (%):  [21 %] 21 % (08/06 2326)  Intake/Output from previous day: 08/06 0701 - 08/07 0700 In: 720 [P.O.:720] Out: 800 [Urine:800] Intake/Output this shift: Total I/O In: -  Out: 500 [Urine:500]  Exam:  No gross blood or drainage overlying the dressing 2+ DP pulse Sensation intact distally in the operative foot Able to dorsiflex and plantarflex the operative foot No calf tenderness.  Negative Homans' sign. Unable to perform straight leg raise   Labs: No results for input(s): HGB in the last 72 hours. No results for input(s): WBC, RBC, HCT, PLT in the last 72 hours. No results for input(s): NA, K, CL, CO2, BUN, CREATININE, GLUCOSE, CALCIUM  in the last 72 hours. No results for input(s): LABPT, INR in the last 72 hours.  Assessment/Plan: Pt is POD2 s/p TKA.    -Plan to discharge to home in coming days pending patient's pain and PT eval  -WBAT with a walker  -Follow-up with Dr. Addie in clinic 2 weeks postoperatively    [facility] 03/29/2024, 6:54 AM

## 2024-03-29 NOTE — Progress Notes (Signed)
 Physical Therapy Treatment Patient Details Name: Richard Walker MRN: [id number] DOB: Jul 17, 1956 Today's Date: 03/29/2024   History of Present Illness Richard Walker is 68 y.o. male who presented for elective left total knee arthroplasty 03/27/24. PMHx: OA, DDD, GERD, HLD, dysrhythmia, MI, and SVT.    PT Comments  Pt greeted supine in bed, pleasant and agreeable to PT session. He advanced OOB mobility ambulating within the room to the bathroom and door using RW with CGA and a chair follow. Pt demonstrated a step-to antalgic gait pattern with heavy reliance on BUE support on RW. He self-limits WBing on LLE d/t pain. Pt alternates between no contact with the ground and foot flat on floor. He fatigues during gait requiring intermittent standing rest breaks frequently and cues for PLB. Reviewed knee precautions and emphasized the importance of engaging in L TKA HEP. Encouraged pt sit up in the recliner chair and continue to use CPM and zero degree bone foam. Pt needs to continue to advance gait distance and participate in stair training. He will continue to benefit from acute skilled PT to increase his independence and safety with mobility to allow discharge.    If plan is discharge home, recommend the following: A little help with walking and/or transfers;A little help with bathing/dressing/bathroom;Assistance with cooking/housework;Assist for transportation;Help with stairs or ramp for entrance   Can travel by private vehicle        Equipment Recommendations  BSC/3in1    Recommendations for Other Services       Precautions / Restrictions Precautions Precautions: Knee;Fall Recall of Precautions/Restrictions: Impaired Precaution/Restrictions Comments: Pt greeted with knees of bed elevated and left heel resting in bone foam. Reviewed the importance of maintaining full extension with FOB flat. Restrictions Weight Bearing Restrictions Per Provider Order: Yes LLE Weight Bearing Per Provider Order:  Weight bearing as tolerated     Mobility  Bed Mobility Overal bed mobility: Needs Assistance Bed Mobility: Supine to Sit     Supine to sit: Min assist, HOB elevated, Used rails     General bed mobility comments: Educated pt on use of gait belt to support LLE and aid in management. He required intermittent minA to support LLE. Cues for sequencing. Pt pulled on bed rail to elevate trunk and scoot hips fwd.    Transfers Overall transfer level: Needs assistance Equipment used: Rolling walker (2 wheels) Transfers: Sit to/from Stand Sit to Stand: From elevated surface, Min assist           General transfer comment: Pt stood from raised bed height. Cued proper hand placement using RW. Encouraged increased knee flex B. Pt initially resting with LLE in full extension and heel supported on floor, he advanced to foot flat but minimal knee flex. Instructed pt on use of momentum. Powered up with minA. Increased time to stabilize and bring feet underneath him once upright. Good eccentric control. Pt kicked out LLE prior to sitting to limit knee flex d/t pain.    Ambulation/Gait Ambulation/Gait assistance: Contact guard assist, +2 safety/equipment (chair follow) Gait Distance (Feet): 15 Feet Assistive device: Rolling walker (2 wheels) Gait Pattern/deviations: Step-to pattern, Decreased step length - right, Decreased step length - left, Decreased stance time - left, Decreased dorsiflexion - left, Decreased weight shift to left, Antalgic, Knee flexed in stance - left Gait velocity: decreased     General Gait Details: Pt ambulated with short, slow laborious steps. Heavy reliance on BUE support on RW. Intermittent standing rest breaks d/t fatigue. Pt self-limited WBing on  LLE d/t pain. He made foot flat contact. Pt manuevered RW well within room with good proximity to AD. Cues for sequencing and improve technique. Limited hip/knee flex on LLE.   Stairs             Wheelchair Mobility      Tilt Bed    Modified Rankin (Stroke Patients Only)       Balance Overall balance assessment: Needs assistance Sitting-balance support: No upper extremity supported, Feet supported Sitting balance-Leahy Scale: Good     Standing balance support: Bilateral upper extremity supported, During functional activity, Reliant on assistive device for balance Standing balance-Leahy Scale: Poor Standing balance comment: Pt dependent on RW.                            Communication Communication Communication: No apparent difficulties  Cognition Arousal: Alert Behavior During Therapy: WFL for tasks assessed/performed   PT - Cognitive impairments: No apparent impairments                         Following commands: Intact      Cueing Cueing Techniques: Verbal cues  Exercises      General Comments General comments (skin integrity, edema, etc.): Encouraged pt to complete L TKA HEP while supine in bed and seated in recliner chair. Instructed pt to sit up for ~2 hours then transfer back to bed and into CPM for ~1 hour and then get back to the recliner chair.      Pertinent Vitals/Pain Pain Assessment Pain Assessment: 0-10 Pain Score: 8  Pain Location: L knee Pain Descriptors / Indicators: Grimacing, Operative site guarding, Discomfort Pain Intervention(s): Monitored during session, Limited activity within patient's tolerance, Repositioned, Patient requesting pain meds-RN notified    Home Living                          Prior Function            PT Goals (current goals can now be found in the care plan section) Acute Rehab PT Goals Patient Stated Goal: Have less pain and tolerate moving better so I can safely return home. Progress towards PT goals: Progressing toward goals    Frequency    7X/week      PT Plan      Co-evaluation              AM-PAC PT 6 Clicks Mobility   Outcome Measure  Help needed turning from your back to  your side while in a flat bed without using bedrails?: A Little Help needed moving from lying on your back to sitting on the side of a flat bed without using bedrails?: A Little Help needed moving to and from a bed to a chair (including a wheelchair)?: A Little Help needed standing up from a chair using your arms (e.g., wheelchair or bedside chair)?: A Little Help needed to walk in hospital room?: Total Help needed climbing 3-5 steps with a railing? : Total 6 Click Score: 14    End of Session Equipment Utilized During Treatment: Gait belt Activity Tolerance: Patient tolerated treatment well;Patient limited by pain;Patient limited by fatigue Patient left: in chair;with call bell/phone within reach;with chair alarm set;with family/visitor present Nurse Communication: Mobility status;Patient requests pain meds PT Visit Diagnosis: Difficulty in walking, not elsewhere classified (R26.2);Pain;Unsteadiness on feet (R26.81);Other abnormalities of gait and mobility (R26.89) Pain - Right/Left:  Left Pain - part of body: Knee     Time: 9082-9052 PT Time Calculation (min) (ACUTE ONLY): 30 min  Charges:    $Gait Training: 8-22 mins $Therapeutic Activity: 8-22 mins PT General Charges $$ ACUTE PT VISIT: 1 Visit                     Randall SAUNDERS, PT, DPT Acute Rehabilitation Services Office: [phone] Secure Chat Preferred  Delon CHRISTELLA Callander 03/29/2024, 10:40 AM

## 2024-03-30 MED ORDER — MAGNESIUM CITRATE PO SOLN
1.0000 | Freq: Once | ORAL | Status: AC
  Administered 2024-03-30: 1 via ORAL
  Filled 2024-03-30: qty 296

## 2024-03-30 MED ORDER — MELATONIN 3 MG PO TABS
3.0000 mg | ORAL_TABLET | Freq: Every day | ORAL | Status: DC
  Administered 2024-03-30 – 2024-03-31 (×2): 3 mg via ORAL
  Filled 2024-03-30 (×2): qty 1

## 2024-03-30 MED ORDER — HYDROMORPHONE HCL 1 MG/ML IJ SOLN
0.5000 mg | Freq: Once | INTRAMUSCULAR | Status: AC
  Administered 2024-03-30: 0.5 mg via INTRAVENOUS
  Filled 2024-03-30: qty 0.5

## 2024-03-30 MED FILL — Clonidine HCl Inj (For Epidural Infusion) 100 MCG/ML: EPIDURAL | Qty: 1 | Status: AC

## 2024-03-30 NOTE — NC FL2 (Signed)
 [place]  MEDICAID FL2 LEVEL OF CARE FORM     IDENTIFICATION  Patient Name: Richard Walker Birthdate: 08-Jan-1956 Sex: male Admission Date (Current Location): 03/27/2024  [facility] and [place] Number:  Producer, television/film/video and Address:  [facility] [place], [address] [place], [place] [address]      Provider Number: [id number]  Attending Physician Name and Address:  Addie Cordella Hamilton, MD  Relative Name and Phone Number:       Current Level of Care: Hospital Recommended Level of Care: Skilled Nursing Facility Prior Approval Number:    Date Approved/Denied:   PASRR Number: [id number]  Discharge Plan: SNF    Current Diagnoses: Patient Active Problem List   Diagnosis Date Noted   S/P TKR (total knee replacement), left 03/29/2024   S/P total knee arthroplasty, left 03/27/2024   Constipation 02/14/2024   Metabolic dysfunction-associated steatotic liver disease (MASLD) 01/13/2024   Abnormal food appetite 11/17/2023   Insulin  resistance 11/17/2023   Class 2 severe obesity due to excess calories with serious comorbidity and body mass index (BMI) of 35.0 to 35.9 in adult (HCC) 11/03/2023   Prediabetes 11/03/2023   Degenerative disc disease, lumbar 12/09/2022   Morbid obesity (HCC) 10/26/2022   Bronchitis 01/29/2022   OSA on CPAP 02/19/2021   Bite, snake 01/28/2021   Neck pain 11/13/2020   Vertigo 11/13/2020   Hypertension 11/03/2020   Contusion of bone 10/23/2020   Degenerative arthritis of knee, bilateral 10/23/2020   Concussion with no loss of consciousness 10/23/2020   Paroxysmal atrial fibrillation (HCC) 05/22/2019   Unspecified atrial flutter (HCC) 05/22/2019   Pain in right foot 03/30/2018   Perennial and seasonal allergic rhinitis 11/09/2017   History of food allergy 11/09/2017   Bronchitis, mucopurulent recurrent (HCC) 09/07/2017   SVT (supraventricular tachycardia) (HCC) 04/23/2015   Abdominal pain, epigastric 06/03/2013   Vitamin D   deficiency 04/09/2013   Esophageal reflux 04/09/2013   Hepatitis B carrier (HCC) 04/09/2013   Other and unspecified hyperlipidemia 04/09/2013   Sleep apnea 04/09/2013    Orientation RESPIRATION BLADDER Height & Weight     Self, Time, Situation, Place  Normal Continent Weight: 242 lb (109.8 kg) Height:  5' 8 (172.7 cm)  BEHAVIORAL SYMPTOMS/MOOD NEUROLOGICAL BOWEL NUTRITION STATUS      Continent    AMBULATORY STATUS COMMUNICATION OF NEEDS Skin   Limited Assist Verbally Surgical wounds                       Personal Care Assistance Level of Assistance  Bathing, Feeding, Dressing Bathing Assistance: Limited assistance Feeding assistance: Limited assistance Dressing Assistance: Limited assistance     Functional Limitations Info  Sight, Hearing, Speech Sight Info: Adequate Hearing Info: Adequate Speech Info: Adequate    SPECIAL CARE FACTORS FREQUENCY  PT (By licensed PT), OT (By licensed OT)                    Contractures Contractures Info: Not present    Additional Factors Info  Code Status Code Status Info: FULL CODE             Current Medications (03/30/2024):  This is the current hospital active medication list Current Facility-Administered Medications  Medication Dose Route Frequency Provider Last Rate Last Admin   acetaminophen  (TYLENOL ) tablet 325-650 mg  325-650 mg Oral Q6H PRN Magnant, Charles L, PA-C   650 mg at 03/29/24 2302   albuterol  (PROVENTIL ) (2.5 MG/3ML) 0.083% nebulizer solution 3 mL  3 mL Inhalation Q6H PRN Magnant, Charles L, PA-C   3 mL at 03/28/24 2325   budesonide -glycopyrrolate -formoterol  (BREZTRI ) 160-9-4.8 MCG/ACT inhaler 2 puff  2 puff Inhalation BID AC Magnant, Charles L, PA-C   2 puff at 03/30/24 9177   docusate sodium  (COLACE) capsule 100 mg  100 mg Oral BID Magnant, Charles L, PA-C   100 mg at 03/30/24 9176   ezetimibe  (ZETIA ) tablet 10 mg  10 mg Oral Daily Magnant, Charles L, PA-C   10 mg at 03/30/24 9176   fenofibrate   tablet 160 mg  160 mg Oral Daily Magnant, Charles L, PA-C   160 mg at 03/30/24 9176   flecainide  (TAMBOCOR ) tablet 150 mg  150 mg Oral Q12H Dean, Gregory Scott, MD   150 mg at 03/30/24 9176   HYDROmorphone  (DILAUDID ) injection 0.5 mg  0.5 mg Intravenous Q4H PRN Magnant, Charles L, PA-C   0.5 mg at 03/30/24 0654   lactated ringers  infusion   Intravenous Continuous Jefm Garnette LABOR, MD 900 mL/hr at 03/27/24 1432 Restarted at 03/27/24 1437   losartan  (COZAAR ) tablet 25 mg  25 mg Oral Daily Magnant, Charles L, PA-C   25 mg at 03/30/24 9176   magnesium  citrate solution 1 Bottle  1 Bottle Oral Once Addie Cordella Hamilton, MD       menthol -cetylpyridinium (CEPACOL) lozenge 3 mg  1 lozenge Oral PRN Magnant, Charles L, PA-C       Or   phenol (CHLORASEPTIC) mouth spray 1 spray  1 spray Mouth/Throat PRN Magnant, Charles L, PA-C       metFORMIN  (GLUCOPHAGE ) tablet 500 mg  500 mg Oral BID WC Magnant, Charles L, PA-C   500 mg at 03/30/24 9176   methocarbamol  (ROBAXIN ) tablet 500 mg  500 mg Oral Q6H PRN Magnant, Charles L, PA-C   500 mg at 03/30/24 1035   metoCLOPramide  (REGLAN ) tablet 5-10 mg  5-10 mg Oral Q8H PRN Magnant, Charles L, PA-C       Or   metoCLOPramide  (REGLAN ) injection 5-10 mg  5-10 mg Intravenous Q8H PRN Magnant, Charles L, PA-C       metoprolol  tartrate (LOPRESSOR ) tablet 25 mg  25 mg Oral BID Magnant, Charles L, PA-C   25 mg at 03/30/24 9176   ondansetron  (ZOFRAN ) tablet 4 mg  4 mg Oral Q6H PRN Magnant, Charles L, PA-C       oxyCODONE  (Oxy IR/ROXICODONE ) immediate release tablet 5-10 mg  5-10 mg Oral Q4H PRN Magnant, Charles L, PA-C   10 mg at 03/30/24 1035   pantoprazole  (PROTONIX ) EC tablet 40 mg  40 mg Oral Daily Magnant, Charles L, PA-C   40 mg at 03/30/24 9176   rivaroxaban  (XARELTO ) tablet 20 mg  20 mg Oral Q supper Magnant, Charles L, PA-C   20 mg at 03/29/24 1735     Discharge Medications: Please see discharge summary for a list of discharge medications.  Relevant Imaging  Results:  Relevant Lab Results:   Additional Information SS# [id number]  Gwenn Julien Norris, [place]

## 2024-03-30 NOTE — Progress Notes (Signed)
  Subjective: Richard Walker is a 68 y.o. male s/p left TKA.  They are POD 3.  Pt's pain is controlled.  Pt denies any complain of chest pain, shortness of breath, abdominal pain, calf pain.  Patient denies any fevers or chills.  Ambulated maximum of about 25 feet with PT earlier.  Was able to do this twice and then another walk for 15 feet with close follow-up with a chair.  Physical therapy recommending skilled nursing facility but patient is refusing and wants to discharge home.  He does have son coming home from college that we will be able to assist him.  Objective: Vital signs in last 24 hours: Temp:  [97.5 F (36.4 C)-98.5 F (36.9 C)] 98.5 F (36.9 C) (08/08 0812) Pulse Rate:  [58-63] 62 (08/08 0812) Resp:  [16-18] 18 (08/08 0812) BP: (105-118)/(52-68) 117/67 (08/08 0812) SpO2:  [96 %-98 %] 98 % (08/08 0822) FiO2 (%):  [21 %] 21 % (08/07 2045)  Intake/Output from previous day: 08/07 0701 - 08/08 0700 In: 240 [P.O.:240] Out: -  Intake/Output this shift: Total I/O In: 120 [P.O.:120] Out: -   Exam:  No gross blood or drainage overlying the dressing 2+ DP pulse Sensation intact distally in the operative foot Able to dorsiflex and plantarflex the operative foot No calf tenderness.  Negative Homans' sign. UnAble to perform straight leg raise   Labs: No results for input(s): HGB in the last 72 hours. No results for input(s): WBC, RBC, HCT, PLT in the last 72 hours. No results for input(s): NA, K, CL, CO2, BUN, CREATININE, GLUCOSE, CALCIUM  in the last 72 hours. No results for input(s): LABPT, INR in the last 72 hours.  Assessment/Plan: Pt is POD 3 s/p TKA.    -Plan to discharge to home in coming days pending patient's pain and PT eval. Son comes home Sunday so that's likely day of discharge. Still requiring IV pain medication and mobility is limited  -WBAT with a walker  -Follow-up with Dr. Addie in clinic 2 weeks postoperatively    [facility] 03/30/2024, 12:00 PM

## 2024-03-30 NOTE — Discharge Instructions (Addendum)
     Discharge Instructions     Diagnosis and Procedures:    Surgeries Performed: Total knee arthroplasty  Discharge Plan:    Diet: Resume usual diet. Begin with light or bland foods.  Drink plenty of fluids.  Activity:  Weight bearing as tolerated. You are advised to go home directly from the hospital or surgical center. Restrict your activities.  GENERAL INSTRUCTIONS: 1.  Please apply ice to your wound to help with swelling and inflammation. This will improve your comfort and your overall recovery following surgery.     3. Patient to notify surgical team if experiences any of the following: Bowel/Bladder dysfunction, uncontrolled pain, nerve/muscle weakness, incision with increased drainage or redness, nausea/vomiting and Fever greater than 101.0 F.  Be alert for signs of infection including redness, streaking, odor, fever or chills. Be alert for excessive pain or bleeding and notify your surgeon immediately.  WOUND INSTRUCTIONS:   Leave your dressing, cast, or splint in place until your post operative visit.  Keep it clean and dry.  Always keep the incision clean and dry until the staples/sutures are removed. If there is no drainage from the incision you should keep it open to air. If there is drainage from the incision you must keep it covered at all times until the drainage stops  Do not soak in a bath tub, hot tub, pool, lake or other body of water  until 21 days after your surgery and your incision is completely dry and healed.  If you have removable sutures (or staples) they must be removed 10-14 days (unless otherwise instructed) from the day of your surgery.     1)  Elevate the extremity as much as possible.  2)  Keep the dressing clean and dry.  3)  Please call us  if the dressing becomes wet or dirty.  4)  If you are experiencing worsening pain or worsening swelling, please call.     MEDICATIONS: Resume all previous home medications at the previous prescribed dose  and frequency unless otherwise noted Start taking the  pain medications on an as-needed basis as prescribed  Please taper down pain medication over the next week following surgery.  Ideally you should not require a refill of any narcotic pain medication.  Take pain medication with food to minimize nausea. In addition to the prescribed pain medication, you may take over-the-counter pain relievers such as Tylenol .  Do NOT take additional tylenol  if your pain medication already has tylenol  in it.  Narcotic policy: Per [facility] clinic policy, our goal is ensure optimal postoperative pain control with a multimodal pain management strategy. For all [facility] patients, our goal is to wean post-operative narcotic medications by 6 weeks post-operatively, and many times sooner. If this is not possible due to utilization of pain medication prior to surgery, your [facility] doctor will support your acute post-operative pain control for the first 6 weeks postoperatively, with a plan to transition you back to your primary pain team following that. Maralee will work to ensure a Therapist, occupational.      Discharge Location: Home   Financial Counseling Patients can apply for the Financial Assistance Program by downloading an application at [email or web address], requesting by mail, [facility]  Health Patient [employer]ing, [address] [place], [place]. [address]), contacting customer service [phone]), or in person at any [facility]  facility.

## 2024-03-30 NOTE — Progress Notes (Signed)
   03/30/24 2215  BiPAP/CPAP/SIPAP  Mask Type Full face mask  Mask Size Large  Patient Home Machine Yes  Safety Check Completed by RT for Home Unit Yes, no issues noted  Patient Home Mask Yes  Patient Home Tubing Yes  Device Plugged into RED Power Outlet Yes   Patient had home CPAP machine brought in for use tonight. Ensured unit was plugged into red outlet, no safety issues noted, and ready for use. Patient stated he could get on by himself. Told patient to call if he needed anything.

## 2024-03-30 NOTE — Progress Notes (Signed)
 Physical Therapy Treatment Patient Details Name: Richard Walker MRN: [id number] DOB: Sep 25, 1955 Today's Date: 03/30/2024   History of Present Illness Richard Walker is 68 y.o. male who presented for elective left total knee arthroplasty 03/27/24. PMHx: OA, DDD, GERD, HLD, dysrhythmia, MI, and SVT.    PT Comments  Pt engaged in gait training using RW with CGA and a chair follow. He increased his gait distance and completed three bouts with a ~1-77min seated rest in between attempts. Pt ambulated a maximum of ~74ft. He demonstrated multiple gait abnormalities with limited ability to correct despite multi-modal cueing. Pt maneuvered RW well without LOB. Discussed the importance of pacing and having a designated seat to rest in when mobilizing within his home environment. Will continue to follow acutely and advance appropriately.     If plan is discharge home, recommend the following: A little help with walking and/or transfers;A little help with bathing/dressing/bathroom;Assistance with cooking/housework;Assist for transportation;Help with stairs or ramp for entrance   Can travel by private vehicle        Equipment Recommendations  BSC/3in1    Recommendations for Other Services       Precautions / Restrictions Precautions Precautions: Knee;Fall Recall of Precautions/Restrictions: Intact Restrictions Weight Bearing Restrictions Per Provider Order: Yes LLE Weight Bearing Per Provider Order: Weight bearing as tolerated     Mobility  Bed Mobility Overal bed mobility: Needs Assistance Bed Mobility: Supine to Sit     Supine to sit: Supervision, HOB elevated, Used rails Sit to supine: Min assist, Used rails, HOB elevated   General bed mobility comments: Pt sat up on R side of bed with increased time. He utilized gait belt around L foot to manage LLE. Returning to bed pt required minA to bring LLE. He was unable to lift fully using gait belt.    Transfers Overall transfer level: Needs  assistance Equipment used: Rolling walker (2 wheels) Transfers: Sit to/from Stand Sit to Stand: From elevated surface, Contact guard assist   Step pivot transfers: Contact guard assist       General transfer comment: Pt stood from raised bed height. Cues for proper hand placement using RW. Powered up without physical assist. CGA for safety/stability. Transferred to recliner chair on right. Good eccentric control, reaching back for arm rest.    Ambulation/Gait Ambulation/Gait assistance: Contact guard assist, +2 safety/equipment (chair follow) Gait Distance (Feet): 25 Feet (1x15, seated rest, 2x25, with seated rest in between bouts) Assistive device: Rolling walker (2 wheels) Gait Pattern/deviations: Step-to pattern, Decreased step length - right, Decreased step length - left, Decreased stance time - left, Decreased dorsiflexion - left, Decreased weight shift to left, Antalgic, Knee flexed in stance - left Gait velocity: decreased Gait velocity interpretation: <1.31 ft/sec, indicative of household ambulator   General Gait Details: Pt demonstrated increased weight acceptance on LLE. Still self-limiting and heavy reliance on BUE support on RW. He takes short steps with limited foot clearence. Pt lacked heel strike and full extension on LLE. He maintained L knee flex throughout gait. Good proximity to RW. He manuever AD well in room, bathroom, and hallway. No LOB.   Stairs             Wheelchair Mobility     Tilt Bed    Modified Rankin (Stroke Patients Only)       Balance Overall balance assessment: Needs assistance Sitting-balance support: No upper extremity supported, Feet supported Sitting balance-Leahy Scale: Good     Standing balance support: Bilateral upper extremity supported,  During functional activity, Reliant on assistive device for balance Standing balance-Leahy Scale: Poor Standing balance comment: Pt dependent on RW.                             Communication Communication Communication: No apparent difficulties  Cognition Arousal: Alert Behavior During Therapy: WFL for tasks assessed/performed   PT - Cognitive impairments: No apparent impairments                         Following commands: Intact      Cueing Cueing Techniques: Verbal cues, Gestural cues, Tactile cues, Visual cues  Exercises      General Comments General comments (skin integrity, edema, etc.): Encouraged pt to continue to increase the time he spends in the CPM machine. Instructed pt to sit up in recliner chair for all meals.      Pertinent Vitals/Pain Pain Assessment Pain Assessment: Faces Faces Pain Scale: Hurts even more Pain Location: L knee Pain Descriptors / Indicators: Operative site guarding, Grimacing, Discomfort, Burning Pain Intervention(s): Monitored during session, Limited activity within patient's tolerance, Repositioned    Home Living                          Prior Function            PT Goals (current goals can now be found in the care plan section) Acute Rehab PT Goals Patient Stated Goal: Continue to get better and return home safely Progress towards PT goals: Progressing toward goals    Frequency    7X/week      PT Plan      Co-evaluation              AM-PAC PT 6 Clicks Mobility   Outcome Measure  Help needed turning from your back to your side while in a flat bed without using bedrails?: A Little Help needed moving from lying on your back to sitting on the side of a flat bed without using bedrails?: A Little Help needed moving to and from a bed to a chair (including a wheelchair)?: A Little Help needed standing up from a chair using your arms (e.g., wheelchair or bedside chair)?: A Little Help needed to walk in hospital room?: Total Help needed climbing 3-5 steps with a railing? : Total 6 Click Score: 14    End of Session Equipment Utilized During Treatment: Gait belt Activity  Tolerance: Patient tolerated treatment well;Patient limited by fatigue;Patient limited by pain Patient left: in bed;with call bell/phone within reach;with bed alarm set Nurse Communication: Mobility status PT Visit Diagnosis: Difficulty in walking, not elsewhere classified (R26.2);Pain;Unsteadiness on feet (R26.81);Other abnormalities of gait and mobility (R26.89) Pain - Right/Left: Left Pain - part of body: Knee     Time: 9151-9079 PT Time Calculation (min) (ACUTE ONLY): 32 min  Charges:    $Gait Training: 23-37 mins PT General Charges $$ ACUTE PT VISIT: 1 Visit                     Randall SAUNDERS, PT, DPT Acute Rehabilitation Services Office: [phone] Secure Chat Preferred  Richard Walker 03/30/2024, 9:38 AM

## 2024-03-30 NOTE — TOC Initial Note (Signed)
 Transition of Care [facility]) - Initial/Assessment Note    Patient Details  Name: Richard Walker MRN: [id number] Date of Birth: 24-Oct-1955  Transition of [facility]) CM/SW Contact:    Luise JAYSON Pan, LCSWA Phone Number: 03/30/2024, 11:55 AM  Clinical Narrative:    CSW spoke with patient about PT recs for SNF. Daughter was at bedside.  Patient stated he would like to follow up with outpatient PT at discharge, specifically the [facility] in [place] as he has been there previously. Patient stated that his father was at a SNF previously and he did not want to go as his fathers experience was not the best.   CSW inquired if patient had transportation to get to outpt PT, patient stated yes. Patient stated [facility] is near his house. CSW asked patient about [facility] services. Patient stated he would rather do Outpatient PT at this time.   Patient inquired about financial resources to assist with surgery bill. CSW will add financial counseling resources to patients AVS.   CSW will continue to follow as needed.   Expected Discharge Plan: Home/Self Care (OP PT) Barriers to Discharge: Continued Medical Work up   Patient Goals and CMS Choice            Expected Discharge Plan and Services In-house Referral: Clinical Social Work     Living arrangements for the past 2 months: Single Family Home                                      Prior Living Arrangements/Services Living arrangements for the past 2 months: Single Family Home Lives with:: Adult Children Patient language and need for interpreter reviewed:: Yes Do you feel safe going back to the place where you live?: Yes      Need for Family Participation in Patient Care: Yes (Comment) Care giver support system in place?: Yes (comment)   Criminal Activity/Legal Involvement Pertinent to Current Situation/Hospitalization: No - Comment as needed  Activities of Daily Living   ADL Screening (condition at time of  admission) Independently performs ADLs?: Yes (appropriate for developmental age) Is the patient deaf or have difficulty hearing?: No Does the patient have difficulty seeing, even when wearing glasses/contacts?: No Does the patient have difficulty concentrating, remembering, or making decisions?: No  Permission Sought/Granted Permission sought to share information with : Facility Industrial/product designer granted to share information with : Yes, Verbal Permission Granted     Permission granted to share info w AGENCY: OP PT office        Emotional Assessment Appearance:: Appears stated age Attitude/Demeanor/Rapport: Engaged Affect (typically observed): Pleasant Orientation: : Oriented to Self, Oriented to Place, Oriented to  Time, Oriented to Situation Alcohol / Substance Use: Not Applicable Psych Involvement: No (comment)  Admission diagnosis:  Primary osteoarthritis of left knee [M17.12] S/P total knee arthroplasty, left [Z96.652] S/P TKR (total knee replacement), left [Z96.652] Patient Active Problem List   Diagnosis Date Noted   S/P TKR (total knee replacement), left 03/29/2024   S/P total knee arthroplasty, left 03/27/2024   Constipation 02/14/2024   Metabolic dysfunction-associated steatotic liver disease (MASLD) 01/13/2024   Abnormal food appetite 11/17/2023   Insulin  resistance 11/17/2023   Class 2 severe obesity due to excess calories with serious comorbidity and body mass index (BMI) of 35.0 to 35.9 in adult (HCC) 11/03/2023   Prediabetes 11/03/2023   Degenerative disc disease, lumbar 12/09/2022  Morbid obesity (HCC) 10/26/2022   Bronchitis 01/29/2022   OSA on CPAP 02/19/2021   Bite, snake 01/28/2021   Neck pain 11/13/2020   Vertigo 11/13/2020   Hypertension 11/03/2020   Contusion of bone 10/23/2020   Degenerative arthritis of knee, bilateral 10/23/2020   Concussion with no loss of consciousness 10/23/2020   Paroxysmal atrial fibrillation (HCC)  05/22/2019   Unspecified atrial flutter (HCC) 05/22/2019   Pain in right foot 03/30/2018   Perennial and seasonal allergic rhinitis 11/09/2017   History of food allergy 11/09/2017   Bronchitis, mucopurulent recurrent (HCC) 09/07/2017   SVT (supraventricular tachycardia) (HCC) 04/23/2015   Abdominal pain, epigastric 06/03/2013   Vitamin D  deficiency 04/09/2013   Esophageal reflux 04/09/2013   Hepatitis B carrier (HCC) 04/09/2013   Other and unspecified hyperlipidemia 04/09/2013   Sleep apnea 04/09/2013   PCP:  Dorina Loving, PA-C Pharmacy:   [id number] - [place], [place] - [address] [place] [address] Phone: [phone] Fax: [phone]  [facility] [place] [facility] [address] [place] [address] Phone: [phone] Fax: [phone]     Social Drivers of Health (SDOH) Social History: SDOH Screenings   Food Insecurity: No Food Insecurity (03/27/2024)  Recent Concern: Food Insecurity - Food Insecurity Present (01/17/2024)  Housing: Low Risk  (03/27/2024)  Recent Concern: Housing - High Risk (01/17/2024)  Transportation Needs: No Transportation Needs (03/27/2024)  Utilities: Not At Risk (03/27/2024)  Alcohol Screen: Low Risk  (01/17/2024)  Depression (PHQ2-9): Low Risk  (01/18/2024)  Financial Resource Strain: High Risk (01/17/2024)  Physical Activity: Inactive (01/17/2024)  Social Connections: Socially Integrated (03/27/2024)  Stress: No Stress Concern Present (01/17/2024)  Tobacco Use: Medium Risk (03/27/2024)  Health Literacy: Adequate Health Literacy (01/18/2024)   SDOH Interventions:     Readmission Risk Interventions     No data to display

## 2024-03-30 NOTE — Plan of Care (Signed)
  Problem: Activity: Goal: Risk for activity intolerance will decrease Outcome: Progressing   Problem: Coping: Goal: Level of anxiety will decrease Outcome: Progressing   Problem: Elimination: Goal: Will not experience complications related to bowel motility Outcome: Progressing   Problem: Pain Managment: Goal: General experience of comfort will improve and/or be controlled Outcome: Progressing

## 2024-03-30 NOTE — Progress Notes (Signed)
 Physical Therapy Treatment Patient Details Name: Richard Walker MRN: [id number] DOB: 04-Jul-1956 Today's Date: 03/30/2024   History of Present Illness Richard Walker is 68 y.o. male who presented for elective left total knee arthroplasty 03/27/24. PMHx: OA, DDD, GERD, HLD, dysrhythmia, MI, and SVT.    PT Comments  Pt engaged in stair training. Educated pt/daughter on how to ascended/descend steps in accordance with his home set-up. Discussed how his family should be positioned in order to support him. Pt verbalized he should lead with RLE while ascending and LLE while descending. He required minA for stability and support. Moderate cues throughout for correct sequencing and improved technique. Pt utilized the RW backwards/forwards twice to simulate entering/exiting his home. He practiced using handrail with HHA to simulate the stairs to his bed/bath; however, he only complete a total of 6 in a row and has 15 to his upstairs. Pt will require increased family support to safely complete functional mobility.    If plan is discharge home, recommend the following: A little help with walking and/or transfers;A little help with bathing/dressing/bathroom;Assistance with cooking/housework;Assist for transportation;Help with stairs or ramp for entrance   Can travel by private vehicle        Equipment Recommendations  BSC/3in1    Recommendations for Other Services       Precautions / Restrictions Precautions Precautions: Knee;Fall Recall of Precautions/Restrictions: Intact Restrictions Weight Bearing Restrictions Per Provider Order: Yes LLE Weight Bearing Per Provider Order: Weight bearing as tolerated     Mobility  Bed Mobility Overal bed mobility: Needs Assistance Bed Mobility: Supine to Sit     Supine to sit: HOB elevated, Used rails, Contact guard Sit to supine: Min assist, Used rails, HOB elevated   General bed mobility comments: Pt required slight assist under LLE in addition to managing it  with the gait belt.    Transfers Overall transfer level: Needs assistance Equipment used: Rolling walker (2 wheels) Transfers: Sit to/from Stand, Bed to chair/wheelchair/BSC Sit to Stand: Contact guard assist   Step pivot transfers: Contact guard assist       General transfer comment: Pt demonstrated proper hand placement using RW. He powered up without physical assist. CGA for safety. Transferred to recliner chair on right. Good eccentric control.    Ambulation/Gait Ambulation/Gait assistance: Contact guard assist Gait Distance (Feet): 10 Feet (x2) Assistive device: Rolling walker (2 wheels) Gait Pattern/deviations: Step-to pattern, Decreased step length - right, Decreased step length - left, Decreased stance time - left, Decreased dorsiflexion - left, Decreased weight shift to left, Antalgic, Knee flexed in stance - left Gait velocity: decreased Gait velocity interpretation: <1.31 ft/sec, indicative of household ambulator   General Gait Details: Pt utilizes a slow antalgic gait pattern with heavy reliance on RW. He manuevered around room well without LOB.   Stairs Stairs: Yes Stairs assistance: Min assist Stair Management: Step to pattern, Backwards, Forwards, With walker, One rail Right Number of Stairs: 2 (x5) General stair comments: Educated pt on how to complete stair using various technique in accordance with his home set-up. Demonstrated use of RW backwards/forwards, unilateral rail forwards/sideways, and unilateral rail plus HHA. Pt completed 2x2 using RW. Cues for sequencing. MinA for stability and to block front of RW. Pt completed 3x2 using R handrail and L HHA with minA for stability. No LOB. Pt able to verbalize lead with RLE when going up and LLE when going down.   Wheelchair Mobility     Tilt Bed    Modified Rankin (Stroke  Patients Only)       Balance Overall balance assessment: Needs assistance Sitting-balance support: No upper extremity supported, Feet  supported Sitting balance-Leahy Scale: Good     Standing balance support: Bilateral upper extremity supported, During functional activity, Reliant on assistive device for balance Standing balance-Leahy Scale: Poor Standing balance comment: Pt dependent on RW.                            Communication Communication Communication: No apparent difficulties  Cognition Arousal: Alert Behavior During Therapy: WFL for tasks assessed/performed   PT - Cognitive impairments: No apparent impairments                         Following commands: Intact      Cueing Cueing Techniques: Verbal cues, Gestural cues, Visual cues  Exercises      General Comments        Pertinent Vitals/Pain Pain Assessment Pain Assessment: Faces Faces Pain Scale: Hurts little more Pain Location: L knee Pain Descriptors / Indicators: Operative site guarding, Grimacing, Discomfort Pain Intervention(s): Monitored during session, Limited activity within patient's tolerance, Repositioned    Home Living                          Prior Function            PT Goals (current goals can now be found in the care plan section) Acute Rehab PT Goals Patient Stated Goal: Regain my independence. Progress towards PT goals: Progressing toward goals    Frequency    7X/week      PT Plan      Co-evaluation              AM-PAC PT 6 Clicks Mobility   Outcome Measure  Help needed turning from your back to your side while in a flat bed without using bedrails?: A Little Help needed moving from lying on your back to sitting on the side of a flat bed without using bedrails?: A Little Help needed moving to and from a bed to a chair (including a wheelchair)?: A Little Help needed standing up from a chair using your arms (e.g., wheelchair or bedside chair)?: A Little Help needed to walk in hospital room?: A Little Help needed climbing 3-5 steps with a railing? : A Little 6 Click  Score: 18    End of Session Equipment Utilized During Treatment: Gait belt Activity Tolerance: Patient tolerated treatment well Patient left: in chair;with call bell/phone within reach;with family/visitor present Nurse Communication: Mobility status PT Visit Diagnosis: Difficulty in walking, not elsewhere classified (R26.2);Pain;Unsteadiness on feet (R26.81);Other abnormalities of gait and mobility (R26.89) Pain - Right/Left: Left Pain - part of body: Knee     Time: 1153-1220 PT Time Calculation (min) (ACUTE ONLY): 27 min  Charges:    $Gait Training: 23-37 mins PT General Charges $$ ACUTE PT VISIT: 1 Visit                     Randall SAUNDERS, PT, DPT Acute Rehabilitation Services Office: [phone] Secure Chat Preferred  Richard Walker 03/30/2024, 1:22 PM

## 2024-03-30 NOTE — Plan of Care (Signed)
 Pt still needing PRN IV meds around the clock.  Pt's appetite is down.  Pt has not had a BM since Monday this week.  Pt attempted to drink Mag Citrate.  Problem: Education: Goal: Knowledge of General Education information will improve Description: Including pain rating scale, medication(s)/side effects and non-pharmacologic comfort measures Outcome: Progressing   Problem: Health Behavior/Discharge Planning: Goal: Ability to manage health-related needs will improve Outcome: Progressing   Problem: Clinical Measurements: Goal: Ability to maintain clinical measurements within normal limits will improve Outcome: Progressing Goal: Will remain free from infection Outcome: Progressing Goal: Diagnostic test results will improve Outcome: Progressing Goal: Respiratory complications will improve Outcome: Progressing Goal: Cardiovascular complication will be avoided Outcome: Progressing   Problem: Activity: Goal: Risk for activity intolerance will decrease Outcome: Progressing   Problem: Coping: Goal: Level of anxiety will decrease Outcome: Progressing   Problem: Education: Goal: Knowledge of the prescribed therapeutic regimen will improve Outcome: Progressing Goal: Individualized Educational Video(s) Outcome: Progressing   Problem: Clinical Measurements: Goal: Postoperative complications will be avoided or minimized Outcome: Progressing

## 2024-03-31 MED ORDER — METHOCARBAMOL 500 MG PO TABS: 500.0000 mg | ORAL_TABLET | Freq: Four times a day (QID) | ORAL | 0 refills | Status: DC | PRN

## 2024-03-31 MED ORDER — ACETAMINOPHEN 325 MG PO TABS: 325.0000 mg | ORAL_TABLET | Freq: Four times a day (QID) | ORAL | 0 refills | Status: DC | PRN

## 2024-03-31 MED ORDER — GABAPENTIN 100 MG PO CAPS: 100.0000 mg | ORAL_CAPSULE | Freq: Two times a day (BID) | ORAL | 0 refills | Status: DC

## 2024-03-31 MED ORDER — OXYCODONE HCL 5 MG PO TABS: 5.0000 mg | ORAL_TABLET | ORAL | 0 refills | Status: DC | PRN

## 2024-03-31 MED ORDER — DOCUSATE SODIUM 100 MG PO CAPS: 100.0000 mg | ORAL_CAPSULE | Freq: Two times a day (BID) | ORAL | 0 refills | Status: DC

## 2024-03-31 MED ORDER — SMOG ENEMA
200.0000 mL | Freq: Once | RECTAL | Status: DC
  Filled 2024-03-31: qty 960

## 2024-03-31 MED ORDER — GABAPENTIN 100 MG PO CAPS
100.0000 mg | ORAL_CAPSULE | Freq: Two times a day (BID) | ORAL | Status: DC
  Administered 2024-03-31 – 2024-04-01 (×3): 100 mg via ORAL
  Filled 2024-03-31 (×3): qty 1

## 2024-03-31 NOTE — Progress Notes (Signed)
 Physical Therapy Treatment Patient Details Name: Richard Walker MRN: [id number] DOB: 10-10-1955 Today's Date: 03/31/2024   History of Present Illness Richard Walker is 68 y.o. male who presented for elective left total knee arthroplasty 03/27/24. PMHx: OA, DDD, GERD, HLD, dysrhythmia, MI, and SVT.    PT Comments  Pt supine in bed on arrival this session.  Pt continues to improve.  He overall CGA this session.  Pt reviewed stair training and performed LE strengthening. ROM limited this session but likely holding back true range d/t pain.     If plan is discharge home, recommend the following: A little help with walking and/or transfers;A little help with bathing/dressing/bathroom;Assistance with cooking/housework;Assist for transportation;Help with stairs or ramp for entrance   Can travel by private vehicle        Equipment Recommendations       Recommendations for Other Services       Precautions / Restrictions Precautions Precautions: Knee;Fall Precaution Booklet Issued: Yes (comment) Recall of Precautions/Restrictions: Intact Precaution/Restrictions Comments: bed in flexed position educated in resting in supporting extension Restrictions Weight Bearing Restrictions Per Provider Order: Yes LLE Weight Bearing Per Provider Order: Weight bearing as tolerated     Mobility  Bed Mobility Overal bed mobility: Needs Assistance Bed Mobility: Supine to Sit     Supine to sit: Supervision     General bed mobility comments: Attempted to get out on L side.  He refused and got out on R side with gt belt as a leg lifter.    Transfers Overall transfer level: Needs assistance Equipment used: Rolling walker (2 wheels) Transfers: Sit to/from Stand, Bed to chair/wheelchair/BSC Sit to Stand: Contact guard assist   Step pivot transfers: Contact guard assist       General transfer comment: Cues for hand placement to and from seated surface.  Cues to advance LLE forward to reduce pain and  imrpove ease.    Ambulation/Gait Ambulation/Gait assistance: Contact guard assist Gait Distance (Feet): 40 Feet Assistive device: Rolling walker (2 wheels) Gait Pattern/deviations: Step-to pattern, Decreased step length - right, Decreased step length - left, Decreased stance time - left, Decreased dorsiflexion - left, Decreased weight shift to left, Antalgic, Knee flexed in stance - left Gait velocity: decreased     General Gait Details: PTA cued for step through pattern but he reports increased pain with step through pattern and defferred back to step to pattern.   Stairs Stairs: Yes Stairs assistance: Min assist Stair Management: Step to pattern, Forwards, Backwards, With walker Number of Stairs: 2 General stair comments: Cues for sequencing and RW placement this session.  pt tolerated well.   Wheelchair Mobility     Tilt Bed    Modified Rankin (Stroke Patients Only)       Balance Overall balance assessment: Needs assistance Sitting-balance support: No upper extremity supported, Feet supported Sitting balance-Leahy Scale: Good       Standing balance-Leahy Scale: Poor Standing balance comment: Pt dependent on RW.                            Communication Communication Communication: No apparent difficulties  Cognition Arousal: Alert Behavior During Therapy: WFL for tasks assessed/performed   PT - Cognitive impairments: No apparent impairments                       PT - Cognition Comments: Pt A,Ox4 Following commands: Intact      Cueing  Cueing Techniques: Verbal cues, Gestural cues, Visual cues  Exercises Total Joint Exercises Ankle Circles/Pumps: AROM, Both, 20 reps, Supine Quad Sets: AROM, Left, 10 reps, Supine Heel Slides: Left, 10 reps, Supine, AAROM Hip ABduction/ADduction: AAROM, Left, 10 reps, Supine Straight Leg Raises: AAROM, Left, 10 reps, Supine Goniometric ROM: 10-62 L knee AROM.    General Comments         Pertinent Vitals/Pain Pain Assessment Pain Assessment: Faces Faces Pain Scale: Hurts little more Pain Location: L knee Pain Descriptors / Indicators: Operative site guarding, Grimacing, Discomfort Pain Intervention(s): Monitored during session, Repositioned, RN gave pain meds during session, Ice applied    Home Living                          Prior Function            PT Goals (current goals can now be found in the care plan section) Acute Rehab PT Goals Potential to Achieve Goals: Good Progress towards PT goals: Progressing toward goals    Frequency    7X/week      PT Plan      Co-evaluation              AM-PAC PT 6 Clicks Mobility   Outcome Measure  Help needed turning from your back to your side while in a flat bed without using bedrails?: A Little Help needed moving from lying on your back to sitting on the side of a flat bed without using bedrails?: A Little Help needed moving to and from a bed to a chair (including a wheelchair)?: A Little Help needed standing up from a chair using your arms (e.g., wheelchair or bedside chair)?: A Little Help needed to walk in hospital room?: A Little Help needed climbing 3-5 steps with a railing? : A Little 6 Click Score: 18    End of Session Equipment Utilized During Treatment: Gait belt Activity Tolerance: Patient tolerated treatment well Patient left: in chair;with call bell/phone within reach;with family/visitor present Nurse Communication: Mobility status PT Visit Diagnosis: Difficulty in walking, not elsewhere classified (R26.2);Pain;Unsteadiness on feet (R26.81);Other abnormalities of gait and mobility (R26.89) Pain - Right/Left: Left Pain - part of body: Knee     Time: 8757-8688 PT Time Calculation (min) (ACUTE ONLY): 29 min  Charges:    $Gait Training: 8-22 mins $Therapeutic Exercise: 8-22 mins PT General Charges $$ ACUTE PT VISIT: 1 Visit                     Toya HAMS , PTA Acute  Rehabilitation Services Office [phone]    Hubert Raatz JINNY Gosling 03/31/2024, 1:21 PM

## 2024-03-31 NOTE — Progress Notes (Signed)
  Subjective: Patient stable.  Had pain this morning.  Is doing the CPM machine.  Was able to do steps yesterday in therapy.  Son is available tomorrow to assist with discharge and home care.   Objective: Vital signs in last 24 hours: Temp:  [97.6 F (36.4 C)-98.5 F (36.9 C)] 97.8 F (36.6 C) (08/09 0436) Pulse Rate:  [62-69] 64 (08/09 0436) Resp:  [16-20] 18 (08/09 0436) BP: (105-117)/(59-67) 105/63 (08/09 0436) SpO2:  [96 %-100 %] 100 % (08/09 0436)  Intake/Output from previous day: 08/08 0701 - 08/09 0700 In: 170 [P.O.:170] Out: -  Intake/Output this shift: No intake/output data recorded.  Exam:  Intact pulses distally Dorsiflexion/Plantar flexion intact  Labs: No results for input(s): HGB in the last 72 hours. No results for input(s): WBC, RBC, HCT, PLT in the last 72 hours. No results for input(s): NA, K, CL, CO2, BUN, CREATININE, GLUCOSE, CALCIUM  in the last 72 hours. No results for input(s): LABPT, INR in the last 72 hours.  Assessment/Plan: Plan at this time is discharge tomorrow.  We will try to add some accessory medication.  Dilaudid  IV discontinued last night.   KANDICE Hamilton Macrina Lehnert 03/31/2024, 7:33 AM

## 2024-03-31 NOTE — TOC Transition Note (Signed)
 Transition of Care [facility]) - Discharge Note   Patient Details  Name: Richard Walker MRN: [id number] Date of Birth: [birth year]  [facility]) CM/SW Contact:  Robynn Eileen Hoose, RN Phone Number: 03/31/2024, 8:23 AM   Clinical Narrative:   Patient is being discharged today. Outpatient PT referral # [id number], placed for [facility] per patient request. Contact information placed on AVS.    Final next level of care: Home/Self Care Barriers to Discharge: No Barriers Identified   Patient Goals and CMS Choice            Discharge Placement                       Discharge Plan and Services Additional resources added to the After Visit Summary for   In-house Referral: Clinical Social Work                        HH Arranged: Patient Refused HH          Social Drivers of Health (SDOH) Interventions SDOH Screenings   Food Insecurity: No Food Insecurity (03/27/2024)  Recent Concern: Food Insecurity - Food Insecurity Present (01/17/2024)  Housing: Low Risk  (03/27/2024)  Recent Concern: Housing - High Risk (01/17/2024)  Transportation Needs: No Transportation Needs (03/27/2024)  Utilities: Not At Risk (03/27/2024)  Alcohol Screen: Low Risk  (01/17/2024)  Depression (PHQ2-9): Low Risk  (01/18/2024)  Financial Resource Strain: High Risk (01/17/2024)  Physical Activity: Inactive (01/17/2024)  Social Connections: Socially Integrated (03/27/2024)  Stress: No Stress Concern Present (01/17/2024)  Tobacco Use: Medium Risk (03/27/2024)  Health Literacy: Adequate Health Literacy (01/18/2024)     Readmission Risk Interventions     No data to display

## 2024-03-31 NOTE — Plan of Care (Signed)
  Problem: Elimination: Goal: Will not experience complications related to bowel motility Outcome: Progressing   Problem: Pain Managment: Goal: General experience of comfort will improve and/or be controlled Outcome: Progressing   Problem: Activity: Goal: Ability to avoid complications of mobility impairment will improve Outcome: Progressing

## 2024-03-31 NOTE — Progress Notes (Signed)
 PT Cancellation Note  Patient Details Name: Richard Walker MRN: [id number] DOB: 08-25-1955   Cancelled Treatment:    Reason Eval/Treat Not Completed: (P) Other (comment) (Pt eating dinner, deferred PT at this time.  Filled ice man and verbally reviewed HEP with education on frequency.  Pt has ambulated several times today with his son.  Educated to ambulate again this pm and to perform LE exercises to L knee.)   [name] [name] 03/31/2024, 4:55 PM  Nevin Grizzle R. , PTA Acute Rehabilitation Services Office [phone]

## 2024-04-01 NOTE — TOC Transition Note (Addendum)
 Transition of Care [facility]) - Discharge Note   Patient Details  Name: Richard Walker MRN: [id number] Date of Birth: 09-Jun-1956  [facility]) CM/SW Contact:  Robynn Eileen Hoose, RN Phone Number: 04/01/2024, 8:42 AM   Clinical Narrative:   Patient is being discharged today. Secure message from floor nurse regarding bedside commode and shower chair. Attempt made to reach patient to discuss DME, no response. Bedside commode/3:1 ordered through Jermaine with [employer]. DME to be delivered to bedside before patient discharges home.  0900: Secure message from nurse, pt wants to get shower chair and acknowledges that he would have to pay for it. Shower chair ordered through Jermaine with [employer] to be delivered to bedside.    Final next level of care: Home/Self Care Barriers to Discharge: No Barriers Identified   Patient Goals and CMS Choice            Discharge Placement                       Discharge Plan and Services Additional resources added to the After Visit Summary for   In-house Referral: Clinical Social Work              DME Arranged: Bedside commode, 3-N-1 DME Agency: [employer] Date DME Agency Contacted: 04/01/24 Time DME Agency Contacted: [phone] Representative spoke with at DME Agency: [place] HH Arranged: Patient Refused HH          Social Drivers of Health (SDOH) Interventions SDOH Screenings   Food Insecurity: No Food Insecurity (03/27/2024)  Recent Concern: Food Insecurity - Food Insecurity Present (01/17/2024)  Housing: Low Risk  (03/27/2024)  Recent Concern: Housing - High Risk (01/17/2024)  Transportation Needs: No Transportation Needs (03/27/2024)  Utilities: Not At Risk (03/27/2024)  Alcohol Screen: Low Risk  (01/17/2024)  Depression (PHQ2-9): Low Risk  (01/18/2024)  Financial Resource Strain: High Risk (01/17/2024)  Physical Activity: Inactive (01/17/2024)  Social Connections: Socially Integrated (03/27/2024)  Stress: No Stress Concern  Present (01/17/2024)  Tobacco Use: Medium Risk (03/27/2024)  Health Literacy: Adequate Health Literacy (01/18/2024)     Readmission Risk Interventions     No data to display

## 2024-04-01 NOTE — Discharge Summary (Signed)
 Patient ID: THADD APUZZO MRN: [id number] DOB/AGE: 1956/03/09 68 y.o.  Admit date: 03/27/2024 Discharge date: 04/01/2024  Admission Diagnoses:  S/P total knee arthroplasty, left  Discharge Diagnoses:  Principal Problem:   S/P total knee arthroplasty, left Active Problems:   S/P TKR (total knee replacement), left   Past Medical History:  Diagnosis Date   Arthritis    DDD (degenerative disc disease), lumbar    L3/L4   Diverticulosis    Dysrhythmia    SVT s/p ablation   Dysrhythmia    PAF   GERD (gastroesophageal reflux disease)    Hyperlipidemia    Low sperm motility    Myocardial infarction (HCC)    Pre-diabetes    Sleep apnea    uses cpap    SVT (supraventricular tachycardia) (HCC)     Surgeries: Procedure(s): ARTHROPLASTY, KNEE, TOTAL, LEFT on 03/27/2024   Consultants (if any):   Discharged Condition: Improved  Hospital Course: VICTORIO CREEDEN is an 68 y.o. male who was admitted 03/27/2024 with a diagnosis of S/P total knee arthroplasty, left and went to the operating room on 03/27/2024 and underwent the above named procedures.    He was given perioperative antibiotics:  Anti-infectives (From admission, onward)    Start     Dose/Rate Route Frequency Ordered Stop   03/28/24 0600  ceFAZolin  (ANCEF ) IVPB 2g/100 mL premix        2 g 200 mL/hr over 30 Minutes Intravenous Every 8 hours 03/27/24 2125 03/28/24 0549   03/27/24 1445  ceFAZolin  (ANCEF ) IVPB 2g/100 mL premix        2 g 200 mL/hr over 30 Minutes Intravenous Every 8 hours 03/27/24 1440 03/28/24 0559   03/27/24 1340  vancomycin  (VANCOCIN ) powder  Status:  Discontinued          As needed 03/27/24 1341 03/27/24 1433   03/27/24 0845  ceFAZolin  (ANCEF ) IVPB 2g/100 mL premix        2 g 200 mL/hr over 30 Minutes Intravenous On call to O.R. 03/27/24 0834 03/27/24 1150   03/27/24 0837  ceFAZolin  (ANCEF ) 2-4 GM/100ML-% IVPB       Note to Pharmacy: Junette Massa A: cabinet override      03/27/24 0837 03/27/24 1244      .  He was given sequential compression devices, early ambulation, and appropriate chemoprophylaxis for DVT prophylaxis.  He benefited maximally from the hospital stay and there were no complications.    Recent vital signs:  Vitals:   04/01/24 0357 04/01/24 0728  BP: 108/60 116/70  Pulse: (!) 51 (!) 59  Resp: 18 16  Temp:  98.4 F (36.9 C)  SpO2: 98% 97%    Recent laboratory studies:  Lab Results  Component Value Date   HGB 16.2 03/20/2024   HGB 15.5 01/20/2024   HGB 16.4 09/22/2023   Lab Results  Component Value Date   WBC 6.7 03/20/2024   PLT 237 03/20/2024   Lab Results  Component Value Date   INR 1.0 01/23/2021   Lab Results  Component Value Date   NA 136 03/20/2024   K 4.2 03/20/2024   CL 104 03/20/2024   CO2 23 03/20/2024   BUN 15 03/20/2024   CREATININE 1.06 03/20/2024   GLUCOSE 98 03/20/2024    Discharge Medications:   Allergies as of 04/01/2024       Reactions   Dust Mite Extract    Pork-derived Products Swelling   Statins    Muscle aches   Tree Extract  Other (See Comments)   Intolerance Grass        Medication List     STOP taking these medications    benzonatate  200 MG capsule Commonly known as: TESSALON    doxycycline  100 MG tablet Commonly known as: VIBRA -TABS   guaiFENesin  600 MG 12 hr tablet Commonly known as: Mucinex    ibuprofen  200 MG tablet Commonly known as: ADVIL        TAKE these medications    acetaminophen  325 MG tablet Commonly known as: TYLENOL  Take 1-2 tablets (325-650 mg total) by mouth every 6 (six) hours as needed for mild pain (pain score 1-3) or fever (or temp > 100.5). What changed:  how much to take when to take this reasons to take this   AeroChamber MV inhaler Use as instructed   albuterol  108 (90 Base) MCG/ACT inhaler Commonly known as: ProAir  HFA Inhale 1-2 puffs into the lungs every 6 (six) hours as needed for wheezing or shortness of breath.   azelastine  0.1 % nasal  spray Commonly known as: ASTELIN  1-2 sprays per nostril 2 times daily as needed.   Breztri  Aerosphere 160-9-4.8 MCG/ACT Aero inhaler Generic drug: budesonide -glycopyrrolate -formoterol  Inhale 2 puffs into the lungs in the morning and at bedtime.   docusate sodium  100 MG capsule Commonly known as: COLACE Take 1 capsule (100 mg total) by mouth 2 (two) times daily.   ezetimibe  10 MG tablet Commonly known as: Zetia  Take 1 tablet (10 mg total) by mouth daily.   fenofibrate  145 MG tablet Commonly known as: TRICOR  Take 1 tablet (145 mg total) by mouth daily.   flecainide  150 MG tablet Commonly known as: TAMBOCOR  Take 150 mg by mouth 2 (two) times daily.   gabapentin  100 MG capsule Commonly known as: NEURONTIN  Take 1 capsule (100 mg total) by mouth 2 (two) times daily.   losartan  25 MG tablet Commonly known as: COZAAR  Take 1 tablet (25 mg total) by mouth daily.   Magnesium  250 MG Tabs Take 250 mg by mouth every 7 (seven) days.   metFORMIN  500 MG tablet Commonly known as: GLUCOPHAGE  TAKE 1 TABLET(500 MG) BY MOUTH TWICE DAILY WITH A MEAL   methocarbamol  500 MG tablet Commonly known as: ROBAXIN  Take 1 tablet (500 mg total) by mouth every 6 (six) hours as needed for muscle spasms.   metoprolol  tartrate 25 MG tablet Commonly known as: LOPRESSOR  Take one tablet by mouth twice a day.  May take one additional tablet by mouth as needed for breakthrough palpitations.   oxyCODONE  5 MG immediate release tablet Commonly known as: Oxy IR/ROXICODONE  Take 1-2 tablets (5-10 mg total) by mouth every 4 (four) hours as needed for moderate pain (pain score 4-6) (pain score 4-6).   pantoprazole  40 MG tablet Commonly known as: PROTONIX  Take 1 tablet (40 mg total) by mouth daily.   PRESCRIPTION MEDICATION Inhale into the lungs at bedtime. CPAP   rivaroxaban  20 MG Tabs tablet Commonly known as: Xarelto  Take 1 tablet (20 mg total) by mouth daily with supper.   sildenafil  100 MG  tablet Commonly known as: VIAGRA  Take 1 tablet (100 mg total) by mouth daily as needed for erectile dysfunction.   Vitamin D  (Ergocalciferol ) 1.25 MG (50000 UNIT) Caps capsule Commonly known as: DRISDOL  Take 1 capsule (50,000 Units total) by mouth every 7 (seven) days.   Zepbound  5 MG/0.5ML Pen Generic drug: tirzepatide  Inject 5 mg into the skin once a week.        Diagnostic Studies: No results found.  Disposition: Discharge  disposition: 01-Home or Self Care       Discharge Instructions     Call MD / Call 911   Complete by: As directed    If you experience chest pain or shortness of breath, CALL 911 and be transported to the hospital emergency room.  If you develope a fever above 101 F, pus (white drainage) or increased drainage or redness at the wound, or calf pain, call your surgeon's office.   Constipation Prevention   Complete by: As directed    Drink plenty of fluids.  Prune juice may be helpful.  You may use a stool softener, such as Colace (over the counter) 100 mg twice a day.  Use MiraLax (over the counter) for constipation as needed.   Diet - low sodium heart healthy   Complete by: As directed    Discharge instructions   Complete by: As directed    1 weightbearing as tolerated with walker 2 CPM machine 1 hour 3 times a day 3 physical therapy for range of motion strengthening 4 okay to shower dressing is waterproof  Per [facility] clinic policy, our goal is ensure optimal postoperative pain control with a multimodal pain management strategy. For all [facility] patients, our goal is to wean post-operative narcotic medications by 6 weeks post-operatively. If this is not possible due to utilization of pain medication prior to surgery, your [facility] doctor will support your acute post-operative pain control for the first 6 weeks postoperatively, with a plan to transition you back to your primary pain team following that. Maralee will work to ensure a Therapist, occupational.    Increase activity slowly as tolerated   Complete by: As directed    Post-operative opioid taper instructions:   Complete by: As directed    POST-OPERATIVE OPIOID TAPER INSTRUCTIONS: It is important to wean off of your opioid medication as soon as possible. If you do not need pain medication after your surgery it is ok to stop day one. Opioids include: Codeine, Hydrocodone (Norco, Vicodin), Oxycodone (Percocet, oxycontin ) and hydromorphone  amongst others.  Long term and even short term use of opiods can cause: Increased pain response Dependence Constipation Depression Respiratory depression And more.  Withdrawal symptoms can include Flu like symptoms Nausea, vomiting And more Techniques to manage these symptoms Hydrate well Eat regular healthy meals Stay active Use relaxation techniques(deep breathing, meditating, yoga) Do Not substitute Alcohol to help with tapering If you have been on opioids for less than two weeks and do not have pain than it is ok to stop all together.  Plan to wean off of opioids This plan should start within one week post op of your joint replacement. Maintain the same interval or time between taking each dose and first decrease the dose.  Cut the total daily intake of opioids by one tablet each day Next start to increase the time between doses. The last dose that should be eliminated is the evening dose.           Follow-up Information     [facility] Follow up.   Specialty: Rehabilitation Why: Physical therapy. Office will call to arrange follow up appointment after discharge. Contact information: [address]  [address] [place]  [address] [phone]        Addie Cordella Hamilton, MD Follow up.   Specialty: Orthopedic Surgery Contact information: [address] [place] [address] [phone]  SignedBETHA ELSPETH PARKER 04/01/2024,  8:17 AM

## 2024-04-01 NOTE — Progress Notes (Signed)
  Subjective: Doing well today, no issues overnight. Pain well controlled. Worked on CPM  Objective: Vital signs in last 24 hours: Temp:  [97.8 F (36.6 C)-98.8 F (37.1 C)] 98.4 F (36.9 C) (08/10 0728) Pulse Rate:  [51-65] 59 (08/10 0728) Resp:  [16-18] 16 (08/10 0728) BP: (104-141)/(60-78) 116/70 (08/10 0728) SpO2:  [95 %-99 %] 97 % (08/10 0728)  Intake/Output from previous day: No intake/output data recorded. Intake/Output this shift: No intake/output data recorded.  Exam:  Intact pulses distally Dorsiflexion/Plantar flexion intact  Labs: No results for input(s): HGB in the last 72 hours. No results for input(s): WBC, RBC, HCT, PLT in the last 72 hours. No results for input(s): NA, K, CL, CO2, BUN, CREATININE, GLUCOSE, CALCIUM  in the last 72 hours. No results for input(s): LABPT, INR in the last 72 hours.  Assessment/Plan: Plan DC this AM   Richard Walker 04/01/2024, 8:15 AM

## 2024-04-01 NOTE — Plan of Care (Signed)
   Problem: Activity: Goal: Risk for activity intolerance will decrease Outcome: Progressing   Problem: Coping: Goal: Level of anxiety will decrease Outcome: Progressing   Problem: Pain Managment: Goal: General experience of comfort will improve and/or be controlled Outcome: Progressing

## 2024-04-01 NOTE — Care Management (Cosign Needed Addendum)
    Durable Medical Equipment  (From admission, onward)           Start     Ordered   04/01/24 0835  For home use only DME 3 n 1  Once        04/01/24 0834   04/01/24 0834  For home use only DME Bedside commode  Once       Comments: Patient confined to a room with no bathroom  Question:  Patient needs a bedside commode to treat with the following condition  Answer:  Weakness   04/01/24 0834           Per PT recommendations      Durable Medical Equipment  (From admission, onward)           Start     Ordered   04/01/24 0859  For home use only DME Other see comment  Once       Comments: Shower Chair  Question:  Length of Need  Answer:  Lifetime   04/01/24 0858   04/01/24 0835  For home use only DME 3 n 1  Once        04/01/24 0834   04/01/24 0834  For home use only DME Bedside commode  Once       Comments: Patient confined to a room with no bathroom  Question:  Patient needs a bedside commode to treat with the following condition  Answer:  Weakness   04/01/24 0834           Per patient Request

## 2024-04-01 NOTE — Progress Notes (Signed)
 PT Cancellation Note  Patient Details Name: Richard Walker MRN: [id number] DOB: 10-23-1955   Cancelled Treatment:    Reason Eval/Treat Not Completed: Other (comment).  Pt is packed, dressed, and almost ready for d/c.  I discussed practicing anything he felt like he needed to practice before going home and he said he was fine.  Waiting on equipment to be delivered. PT to follow acutely until d/c confirmed.    Asberry HERO, PT, DPT  Acute Rehabilitation Secure chat is best for contact #[phone] office      Asberry KATHEE Friendly 04/01/2024, 9:33 AM

## 2024-04-02 ENCOUNTER — Encounter: Payer: Self-pay | Admitting: Gastroenterology

## 2024-04-03 ENCOUNTER — Ambulatory Visit (HOSPITAL_BASED_OUTPATIENT_CLINIC_OR_DEPARTMENT_OTHER): Payer: Medicare (Managed Care) | Admitting: Pulmonary Disease

## 2024-04-04 ENCOUNTER — Ambulatory Visit: Payer: Medicare (Managed Care) | Admitting: Rehabilitative and Restorative Service Providers"

## 2024-04-04 ENCOUNTER — Encounter: Payer: Self-pay | Admitting: Rehabilitative and Restorative Service Providers"

## 2024-04-04 ENCOUNTER — Ambulatory Visit: Payer: Medicare (Managed Care) | Admitting: Gastroenterology

## 2024-04-04 DIAGNOSIS — R600 Localized edema: Secondary | ICD-10-CM | POA: Diagnosis not present

## 2024-04-04 DIAGNOSIS — M25562 Pain in left knee: Secondary | ICD-10-CM

## 2024-04-04 DIAGNOSIS — R262 Difficulty in walking, not elsewhere classified: Secondary | ICD-10-CM | POA: Diagnosis not present

## 2024-04-04 DIAGNOSIS — M6281 Muscle weakness (generalized): Secondary | ICD-10-CM | POA: Diagnosis not present

## 2024-04-04 DIAGNOSIS — M25662 Stiffness of left knee, not elsewhere classified: Secondary | ICD-10-CM | POA: Diagnosis not present

## 2024-04-04 NOTE — Therapy (Signed)
 OUTPATIENT PHYSICAL THERAPY LOWER EXTREMITY EVALUATION   Patient Name: Richard Walker MRN: [id number] DOB:1956-06-08, 68 y.o., male Today's Date: 04/04/2024  END OF SESSION:  PT End of Session - 04/04/24 1632     Visit Number 1    Number of Visits 24    Date for PT Re-Evaluation 06/27/24    Authorization - Number of Visits 33    Progress Note Due on Visit 10    PT Start Time 1517    PT Stop Time 1602    PT Time Calculation (min) 45 min    Activity Tolerance Patient tolerated treatment well;No increased pain;Patient limited by pain    Behavior During Therapy [facility] for tasks assessed/performed          Past Medical History:  Diagnosis Date   Arthritis    DDD (degenerative disc disease), lumbar    L3/L4   Diverticulosis    Dysrhythmia    SVT s/p ablation   Dysrhythmia    PAF   GERD (gastroesophageal reflux disease)    Hyperlipidemia    Low sperm motility    Myocardial infarction (HCC)    Pre-diabetes    Sleep apnea    uses cpap    SVT (supraventricular tachycardia) [facility])    Past Surgical History:  Procedure Laterality Date   AMPUTATION Left 07/15/2018   Procedure: . REPAIR OF LEFT INDEX AND LONG FINGER BY FUSION /  REPAIR OF RING FINGER;  Surgeon: Sebastian Lenis, MD;  Location: [facility] OR;  Service: Orthopedics;  Laterality: Left;   BACK SURGERY     BLALOCK PROCEDURE     Ablation   CARDIAC CATHETERIZATION N/A 12/22/2015   Procedure: Left Heart Cath and Coronary Angiography;  Surgeon: Peter M Swaziland, MD;  Location: [facility] INVASIVE CV LAB;  Service: Cardiovascular;  Laterality: N/A;   CARDIAC ELECTROPHYSIOLOGY STUDY AND ABLATION     ELECTROPHYSIOLOGIC STUDY N/A 02/19/2016   Procedure: SVT Ablation;  Surgeon: Danelle LELON Birmingham, MD;  Location: [facility] INVASIVE CV LAB;  Service: Cardiovascular;  Laterality: N/A;   HAND SURGERY     septal reconstruction  2017   TOTAL KNEE ARTHROPLASTY Left 03/27/2024   Procedure: ARTHROPLASTY, KNEE, TOTAL, LEFT;  Surgeon: Addie Cordella Hamilton, MD;   Location: [facility] OR;  Service: Orthopedics;  Laterality: Left;   TURBINATE REDUCTION Bilateral 07/2016   WISDOM TOOTH EXTRACTION     Patient Active Problem List   Diagnosis Date Noted   S/P TKR (total knee replacement), left 03/29/2024   S/P total knee arthroplasty, left 03/27/2024   Constipation 02/14/2024   Metabolic dysfunction-associated steatotic liver disease (MASLD) 01/13/2024   Abnormal food appetite 11/17/2023   Insulin  resistance 11/17/2023   Class 2 severe obesity due to excess calories with serious comorbidity and body mass index (BMI) of 35.0 to 35.9 in adult [facility]) 11/03/2023   Prediabetes 11/03/2023   Degenerative disc disease, lumbar 12/09/2022   Morbid obesity (HCC) 10/26/2022   Bronchitis 01/29/2022   OSA on CPAP 02/19/2021   Bite, snake 01/28/2021   Neck pain 11/13/2020   Vertigo 11/13/2020   Hypertension 11/03/2020   Contusion of bone 10/23/2020   Degenerative arthritis of knee, bilateral 10/23/2020   Concussion with no loss of consciousness 10/23/2020   Paroxysmal atrial fibrillation (HCC) 05/22/2019   Unspecified atrial flutter (HCC) 05/22/2019   Pain in right foot 03/30/2018   Perennial and seasonal allergic rhinitis 11/09/2017   History of food allergy 11/09/2017   Bronchitis, mucopurulent recurrent (HCC) 09/07/2017   SVT (supraventricular tachycardia) (HCC)  04/23/2015   Abdominal pain, epigastric 06/03/2013   Vitamin D  deficiency 04/09/2013   Esophageal reflux 04/09/2013   Hepatitis B carrier (HCC) 04/09/2013   Other and unspecified hyperlipidemia 04/09/2013   Sleep apnea 04/09/2013    PCP: Dallas Maxwell, PA-C  REFERRING PROVIDER: Cordella Glendia Hutchinson, MD  REFERRING DIAG:  Diagnosis  R53.1 (ICD-10-CM) - Weakness    THERAPY DIAG:  Difficulty in walking, not elsewhere classified - Plan: PT plan of care cert/re-cert  Muscle weakness (generalized) - Plan: PT plan of care cert/re-cert  Localized edema - Plan: PT plan of care cert/re-cert  Knee  stiffness, left - Plan: PT plan of care cert/re-cert  Acute pain of left knee - Plan: PT plan of care cert/re-cert  Rationale for Evaluation and Treatment: Rehabilitation  ONSET DATE: 03/27/2024  SUBJECTIVE:   SUBJECTIVE STATEMENT: Richard Walker had his left knee replaced 03/27/2024.  He notes he is not getting much sleep, including less than 30 minutes of sleep last night.  He reports he is very pain limited with all ADLs at this time.  PERTINENT HISTORY: L3-4 lumbar DDD; pre-diabetes; previous back surgery; cardiac history PAIN:  Are you having pain? Yes: NPRS scale: 10/10 Pain location: Lt Knee Pain description: Ache, sore, stiff Aggravating factors: Constantly aggravated Relieving factors: Some relief with medication, but not much  PRECAUTIONS: Back  RED FLAGS: None   WEIGHT BEARING RESTRICTIONS: No  FALLS:  Has patient fallen in last 6 months? No  LIVING ENVIRONMENT: Lives with: lives with their family, lives with their son, and lives with their daughter Lives in: House/apartment Stairs: Avoids stairs, walker and help for stairs now Has following equipment at home: Vannie - 2 wheeled  OCCUPATION: Retired  PLOF: Independent  PATIENT GOALS: Yard work, be more active  NEXT MD VISIT: 04/11/2024  OBJECTIVE:  Note: Objective measures were completed at Evaluation unless otherwise noted.  DIAGNOSTIC FINDINGS: See chart  PATIENT SURVEYS:  PSFS: THE PATIENT SPECIFIC FUNCTIONAL SCALE  Place score of 0-10 (0 = unable to perform activity and 10 = able to perform activity at the same level as before injury or problem)  Activity Date: 04/04/2024    Sleep 0    2.  Sitting 2    3.  Moving around 2    4.  Showering 1    Total Score .125       Total Score = Sum of activity scores/number of activities  Minimally Detectable Change: 3 points (for single activity); 2 points (for average score)  [facility] (nd). The Patient Specific Functional Scale . Retrieved from  [email or web address]   COGNITION: Overall cognitive status: Within functional limits for tasks assessed     SENSATION: WFL  EDEMA:  Noted and not objectively assessed   LOWER EXTREMITY ROM:  Active ROM Left/Right 04/04/2024   Hip flexion    Hip extension    Hip abduction    Hip adduction    Hip internal rotation    Hip external rotation    Knee flexion 62/145   Knee extension -5/0   Ankle dorsiflexion    Ankle plantarflexion    Ankle inversion    Ankle eversion     (Blank rows = not tested)  LOWER EXTREMITY STRENGTH:  MMT Left/Right 04/04/2024   Hip flexion    Hip extension    Hip abduction    Hip adduction    Hip internal rotation    Hip external rotation    Knee flexion    Knee extension  2+/4+   Ankle dorsiflexion    Ankle plantarflexion    Ankle inversion    Ankle eversion     (Blank rows = not tested)  GAIT: Distance walked: 50 feet Assistive device utilized: Walker - 2 wheeled Level of assistance: Modified independence Comments: Richard Walker was assistive device free before surgery                                                                                                                                TREATMENT DATE: 04/04/2024 Quadriceps sets with heel prop 10 x 5 seconds Seated knee extension AROM 10 x 3 seconds Seated knee flexion AAROM (right pushes left into flexion) 5 x 10 seconds Tailgate knee flexion 1 minute  [id number]: Discussed the early focus on knee active range of motion (extension and flexion); edema control and quadriceps strength.  Discussed that this is a very uncomfortable process and that Richard Walker will need to push through the pain for an acceptable outcome.  Vaso Left knee medium pressure 34 degrees 10 minutes   PATIENT EDUCATION:  Education details: See above Person educated: Patient and Child(ren) Education method: Explanation, Demonstration, Tactile cues, Verbal cues, and  Handouts Education comprehension: verbalized understanding, returned demonstration, verbal cues required, tactile cues required, and needs further education  HOME EXERCISE PROGRAM: Access Code: [email or web address] URL: [email or web address] Date: 04/04/2024 Prepared by: Lamar Ivory  Exercises - Seated Knee Flexion AAROM  - 5 x daily - 7 x weekly - 1 sets - 10 reps - 10 seconds hold - Seated Long Arc Quad  - 5 x daily - 7 x weekly - 1 sets - 10 reps - 3 seconds hold - Supine Quadricep Sets  - 5 x daily - 7 x weekly - 2 sets - 10 reps - 5 second hold  ASSESSMENT:  CLINICAL IMPRESSION: Patient is a 68 y.o. male who was seen today for physical therapy evaluation and treatment for  Diagnosis  R53.1 (ICD-10-CM) - Weakness  .  Richard Walker had his left knee replaced 03/27/2024 with current AROM at 5 - 0 - 62 degrees.  He also has edema and quadriceps strength impairments in need of skilled care.  He is currently using a 2 wheeled walker and he was assistive device free pre-surgery.  Richard Walker is a good candidate for supervised physical therapy and should meet the below listed goals if given the recommended plan of care.  OBJECTIVE IMPAIRMENTS: Abnormal gait, decreased activity tolerance, decreased endurance, decreased knowledge of condition, difficulty walking, decreased ROM, decreased strength, increased edema, impaired perceived functional ability, and pain.   ACTIVITY LIMITATIONS: bending, sitting, standing, squatting, sleeping, stairs, transfers, bed mobility, dressing, and locomotion level  PARTICIPATION LIMITATIONS: driving, community activity, and yard work  PERSONAL FACTORS: L3-4 lumbar DDD; pre-diabetes; previous back surgery; cardiac history are also affecting patient's functional outcome.    REHAB POTENTIAL: Good  CLINICAL DECISION MAKING: Stable/uncomplicated  EVALUATION COMPLEXITY: Low   GOALS: Goals reviewed with patient? Yes  SHORT TERM GOALS: Target date: 05/16/2024 Richard Walker will be  independent with his day 1 home exercise program Baseline: Started 04/04/2024 Goal status: INITIAL  2.  Improve left knee active range of motion to 3 - 0 -100 degrees Baseline: 5 - 0 - 62 degrees Goal status: INITIAL  3.  Improve left quadriceps strength as assessed by MMT and functional testing Baseline: 2+/5 at evaluation Goal status: INITIAL  4.  Richard Walker will be independent with a single-point cane for ambulation outside the home Baseline: Wheeled walker full-time Goal status: INITIAL   LONG TERM GOALS: Target date: 06/27/2024  Improve patient specific functional score to at least 5 Baseline: 0.125 Goal status: INITIAL  2.  Richard Walker will report left knee pain consistently 0-3/10 on the numeric pain rating scale Baseline: 10/10 Goal status: INITIAL  3.  Improve left knee AROM to 2 - 0 - 110 degrees or better Baseline: 5 - 0 - 62 degrees Goal status: INITIAL  4.  Improve left quadriceps strength as assessed by MMT and Richard Walker's ability to ambulate without an assistive device 100% of the time Baseline: Wheeled walker full-time Goal status: INITIAL  5.  Richard Walker will be independent with his long-term maintenance home exercise program at discharge Baseline: Started 04/04/2024 Goal status: INITIAL    PLAN:  PT FREQUENCY: 2-3x/week  PT DURATION: 12 weeks  PLANNED INTERVENTIONS: 97750- Physical Performance Testing, 97110-Therapeutic exercises, 97530- Therapeutic activity, 97112- Neuromuscular re-education, 97535- Self Care, [id number]- Manual therapy, [phone]- Gait training, [phone]- Vasopneumatic device, Patient/Family education, Balance training, Stair training, Joint mobilization, and Cryotherapy  PLAN FOR NEXT SESSION: Typical post knee replacement rehabilitation with emphasis on edema control, quadriceps strength and knee flexion > extension active range of motion.   Myer LELON Ivory, PT, MPT 04/04/2024, 4:51 PM

## 2024-04-05 ENCOUNTER — Encounter: Payer: Self-pay | Admitting: Rehabilitative and Restorative Service Providers"

## 2024-04-05 ENCOUNTER — Ambulatory Visit (INDEPENDENT_AMBULATORY_CARE_PROVIDER_SITE_OTHER): Payer: Medicare (Managed Care) | Admitting: Rehabilitative and Restorative Service Providers"

## 2024-04-05 ENCOUNTER — Telehealth: Payer: Self-pay | Admitting: Orthopedic Surgery

## 2024-04-05 DIAGNOSIS — R262 Difficulty in walking, not elsewhere classified: Secondary | ICD-10-CM | POA: Diagnosis not present

## 2024-04-05 DIAGNOSIS — M6281 Muscle weakness (generalized): Secondary | ICD-10-CM | POA: Diagnosis not present

## 2024-04-05 DIAGNOSIS — R600 Localized edema: Secondary | ICD-10-CM

## 2024-04-05 DIAGNOSIS — M25662 Stiffness of left knee, not elsewhere classified: Secondary | ICD-10-CM | POA: Diagnosis not present

## 2024-04-05 DIAGNOSIS — M25562 Pain in left knee: Secondary | ICD-10-CM

## 2024-04-05 NOTE — Therapy (Signed)
 OUTPATIENT PHYSICAL THERAPY LOWER EXTREMITY TREATMENT   Patient Name: Richard Walker MRN: [id number] DOB:[birth year], 68 y.o., male Today's Date: 04/05/2024  END OF SESSION:  PT End of Session - 04/05/24 1601     Visit Number 2    Number of Visits 24    Date for PT Re-Evaluation 06/27/24    Authorization - Number of Visits 33    Progress Note Due on Visit 10    PT Start Time 1600    PT Stop Time 1649    PT Time Calculation (min) 49 min    Activity Tolerance Patient tolerated treatment well;No increased pain;Patient limited by pain    Behavior During Therapy Richard Walker for tasks assessed/performed           Past Medical History:  Diagnosis Date   Arthritis    DDD (degenerative disc disease), lumbar    L3/L4   Diverticulosis    Dysrhythmia    SVT s/p ablation   Dysrhythmia    PAF   GERD (gastroesophageal reflux disease)    Hyperlipidemia    Low sperm motility    Myocardial infarction (HCC)    Pre-diabetes    Sleep apnea    uses cpap    SVT (supraventricular tachycardia) Richard Walker)    Past Surgical History:  Procedure Laterality Date   AMPUTATION Left 07/15/2018   Procedure: . REPAIR OF LEFT INDEX AND LONG FINGER BY FUSION /  REPAIR OF RING FINGER;  Surgeon: Richard Lenis, MD;  Location: [facility];  Service: Orthopedics;  Laterality: Left;   BACK SURGERY     BLALOCK PROCEDURE     Ablation   CARDIAC CATHETERIZATION N/A 12/22/2015   Procedure: Left Heart Cath and Coronary Angiography;  Surgeon: Richard M [place], MD;  [facility];  Service: Cardiovascular;  Laterality: N/A;   CARDIAC ELECTROPHYSIOLOGY STUDY AND ABLATION     ELECTROPHYSIOLOGIC STUDY N/A 02/19/2016   Procedure: SVT Ablation;  Surgeon: Richard LELON [place], MD;  [facility]: [facility];  Service: Cardiovascular;  Laterality: N/A;   HAND SURGERY     septal reconstruction  2017   TOTAL KNEE ARTHROPLASTY Left 03/27/2024   Procedure: ARTHROPLASTY, KNEE, TOTAL, LEFT;  Surgeon: Richard Richard Hamilton, MD;   Location: [facility];  Service: Orthopedics;  Laterality: Left;   TURBINATE REDUCTION Bilateral 07/2016   WISDOM TOOTH EXTRACTION     Patient Active Problem List   Diagnosis Date Noted   S/P TKR (total knee replacement), left 03/29/2024   S/P total knee arthroplasty, left 03/27/2024   Constipation 02/14/2024   Metabolic dysfunction-associated steatotic liver disease (MASLD) 01/13/2024   Abnormal food appetite 11/17/2023   Insulin  resistance 11/17/2023   Class 2 severe obesity due to excess calories with serious comorbidity and body mass index (BMI) of 35.0 to 35.9 in adult [facility]) 11/03/2023   Prediabetes 11/03/2023   Degenerative disc disease, lumbar 12/09/2022   Morbid obesity (HCC) 10/26/2022   Bronchitis 01/29/2022   OSA on CPAP 02/19/2021   Bite, snake 01/28/2021   Neck pain 11/13/2020   Vertigo 11/13/2020   Hypertension 11/03/2020   Contusion of bone 10/23/2020   Degenerative arthritis of knee, bilateral 10/23/2020   Concussion with no loss of consciousness 10/23/2020   Paroxysmal atrial fibrillation (HCC) 05/22/2019   Unspecified atrial flutter (HCC) 05/22/2019   Pain in right foot 03/30/2018   Perennial and seasonal allergic rhinitis 11/09/2017   History of food allergy 11/09/2017   Bronchitis, mucopurulent recurrent (HCC) 09/07/2017   SVT (supraventricular tachycardia) (  HCC) 04/23/2015   Abdominal pain, epigastric 06/03/2013   Vitamin D  deficiency 04/09/2013   Esophageal reflux 04/09/2013   Hepatitis B carrier (HCC) 04/09/2013   Other and unspecified hyperlipidemia 04/09/2013   Sleep apnea 04/09/2013    PCP: Richard Maxwell, PA-C  REFERRING PROVIDER: Cordella Glendia Hutchinson, MD  REFERRING DIAG:  Diagnosis  R53.1 (ICD-10-CM) - Weakness    THERAPY DIAG:  Difficulty in walking, not elsewhere classified  Muscle weakness (generalized)  Localized edema  Knee stiffness, left  Acute pain of left knee  Rationale for Evaluation and Treatment: Rehabilitation  ONSET  DATE: 03/27/2024  SUBJECTIVE:   SUBJECTIVE STATEMENT: Richard Walker reports some HEP compliance.  Sleep, flexion AROM and edema control are his primary concerns.  Richard Walker had his left knee replaced 03/27/2024.  He notes he is not getting much sleep, including less than 30 minutes of sleep last night.  He reports he is very pain limited with all ADLs at this time.  PERTINENT HISTORY: L3-4 lumbar DDD; pre-diabetes; previous back surgery; cardiac history PAIN:  Are you having pain? Yes: NPRS scale: 10/10 Pain location: Lt Knee Pain description: Ache, sore, stiff Aggravating factors: Constantly aggravated Relieving factors: Some relief with medication, but not much  PRECAUTIONS: Back  RED FLAGS: None   WEIGHT BEARING RESTRICTIONS: No  FALLS:  Has patient fallen in last 6 months? No  LIVING ENVIRONMENT: Lives with: lives with their family, lives with their son, and lives with their daughter Lives in: House/apartment Stairs: Avoids stairs, walker and help for stairs now Has following equipment at home: Richard [facility] - 2 wheeled  OCCUPATION: Retired  PLOF: Independent  PATIENT GOALS: Yard work, be more active  NEXT MD VISIT: 04/11/2024  OBJECTIVE:  Note: Objective measures were completed at Evaluation unless otherwise noted.  DIAGNOSTIC FINDINGS: See chart  PATIENT SURVEYS:  PSFS: THE PATIENT SPECIFIC FUNCTIONAL SCALE  Place score of 0-10 (0 = unable to perform activity and 10 = able to perform activity at the same level as before injury or problem)  Activity Date: 04/04/2024    Sleep 0    2.  Sitting 2    3.  Moving around 2    4.  Showering 1    Total Score .125       Total Score = Sum of activity scores/number of activities  Minimally Detectable Change: 3 points (for single activity); 2 points (for average score)  [facility] (nd). The Patient Specific Functional Scale . Retrieved from [email or web address]    COGNITION: Overall cognitive status: Within functional limits for tasks assessed     SENSATION: WFL  EDEMA:  Noted and not objectively assessed   LOWER EXTREMITY ROM:  Active ROM Left/Right 04/04/2024 Left 04/05/2024  Hip flexion    Hip extension    Hip abduction    Hip adduction    Hip internal rotation    Hip external rotation    Knee flexion 62/145 82 with belt supine  Knee extension -5/0 -3 supine  Ankle dorsiflexion    Ankle plantarflexion    Ankle inversion    Ankle eversion     (Blank rows = not tested)  LOWER EXTREMITY STRENGTH:  MMT Left/Right 04/04/2024   Hip flexion    Hip extension    Hip abduction    Hip adduction    Hip internal rotation    Hip external rotation    Knee flexion    Knee extension 2+/4+   Ankle dorsiflexion    Ankle plantarflexion  Ankle inversion    Ankle eversion     (Blank rows = not tested)  GAIT: Distance walked: 50 feet Assistive device utilized: Walker - 2 wheeled Level of assistance: Modified independence Comments: Daylyn was assistive device free before surgery                                                                                                                                TREATMENT DATE:  04/05/2024 Recumbent bike Seat 7 AAROM with PT assist for flexion 8 minutes Quadriceps sets with heel prop 10 x 5 seconds Seated knee extension AROM 10 x 3 seconds Seated knee flexion AAROM (right pushes left into flexion) 5 x 10 seconds Tailgate knee flexion 1 minute Supine knee flexion with strap 5 x 10 seconds with a quad set between reps  Functional Activities: Double Leg Press 100# 12 x slow eccentrics, stretch into flexion and full extension Single Leg Press 25# 10 x slow eccentrics, stretch into flexion and full extension  Vaso Left knee medium pressure 34 degrees 10 minutes   04/04/2024 Quadriceps sets with heel prop 10 x 5 seconds Seated knee extension AROM 10 x 3 seconds Seated knee flexion AAROM (right  pushes left into flexion) 5 x 10 seconds Tailgate knee flexion 1 minute  [id number]: Discussed the early focus on knee active range of motion (extension and flexion); edema control and quadriceps strength.  Discussed that this is a very uncomfortable process and that Corie will need to push through the pain for an acceptable outcome.  Vaso Left knee medium pressure 34 degrees 10 minutes   PATIENT EDUCATION:  Education details: See above Person educated: Patient and Child(ren) Education method: Explanation, Demonstration, Tactile cues, Verbal cues, and Handouts Education comprehension: verbalized understanding, returned demonstration, verbal cues required, tactile cues required, and needs further education  HOME EXERCISE PROGRAM: Access Code: [email or web address] URL: [email or web address] Date: 04/04/2024 Prepared by: Lamar Ivory  Exercises - Seated Knee Flexion AAROM  - 5 x daily - 7 x weekly - 1 sets - 10 reps - 10 seconds hold - Seated Long Arc Quad  - 5 x daily - 7 x weekly - 1 sets - 10 reps - 3 seconds hold - Supine Quadricep Sets  - 5 x daily - 7 x weekly - 2 sets - 10 reps - 5 second hold  ASSESSMENT:  CLINICAL IMPRESSION: AAROM was 0 - 3 - 82 degrees today.  Darreld was encouraged to increase his HEP compliance to facilitate meeting long-term goals.  Good progress from day 1 yesterday, even with less than recommended HEP compliance.  Patient is a 68 y.o. male who was seen today for physical therapy evaluation and treatment for  Diagnosis  R53.1 (ICD-10-CM) - Weakness  .  Fenris had his left knee replaced 03/27/2024 with current AROM at 5 - 0 - 62 degrees.  He also has edema and quadriceps strength impairments in need of skilled care.  He is  currently using a 2 wheeled walker and he was assistive device free pre-surgery.  Osmany is a good candidate for supervised physical therapy and should meet the below listed goals if given the recommended plan of care.  OBJECTIVE IMPAIRMENTS:  Abnormal gait, decreased activity tolerance, decreased endurance, decreased knowledge of condition, difficulty walking, decreased ROM, decreased strength, increased edema, impaired perceived functional ability, and pain.   ACTIVITY LIMITATIONS: bending, sitting, standing, squatting, sleeping, stairs, transfers, bed mobility, dressing, and locomotion level  PARTICIPATION LIMITATIONS: driving, community activity, and yard work  PERSONAL FACTORS: L3-4 lumbar DDD; pre-diabetes; previous back surgery; cardiac history are also affecting patient's functional outcome.    REHAB POTENTIAL: Good  CLINICAL DECISION MAKING: Stable/uncomplicated  EVALUATION COMPLEXITY: Low   GOALS: Goals reviewed with patient? Yes  SHORT TERM GOALS: Target date: 05/16/2024 Dareld will be independent with his day 1 home exercise program Baseline: Started 04/04/2024 Goal status: On Going 04/05/2024  2.  Improve left knee active range of motion to 3 - 0 -100 degrees Baseline: 5 - 0 - 62 degrees Goal status: Partially Met 04/05/2024  3.  Improve left quadriceps strength as assessed by MMT and functional testing Baseline: 2+/5 at evaluation Goal status: INITIAL  4.  Raynard will be independent with a single-point cane for ambulation outside the home Baseline: Wheeled walker full-time Goal status: INITIAL   LONG TERM GOALS: Target date: 06/27/2024  Improve patient specific functional score to at least 5 Baseline: 0.125 Goal status: INITIAL  2.  Norlan will report left knee pain consistently 0-3/10 on the numeric pain rating scale Baseline: 10/10 Goal status: INITIAL  3.  Improve left knee AROM to 2 - 0 - 110 degrees or better Baseline: 5 - 0 - 62 degrees Goal status: INITIAL  4.  Improve left quadriceps strength as assessed by MMT and Granger's ability to ambulate without an assistive device 100% of the time Baseline: Wheeled walker full-time Goal status: INITIAL  5.  Granville will be independent with his long-term  maintenance home exercise program at discharge Baseline: Started 04/04/2024 Goal status: INITIAL    PLAN:  PT FREQUENCY: 2-3x/week  PT DURATION: 12 weeks  PLANNED INTERVENTIONS: 97750- Physical Performance Testing, 97110-Therapeutic exercises, 97530- Therapeutic activity, 97112- Neuromuscular re-education, 97535- Self Care, [id number]- Manual therapy, [phone]- Gait training, [phone]- Vasopneumatic device, Patient/Family education, Balance training, Stair training, Joint mobilization, and Cryotherapy  PLAN FOR NEXT SESSION: Typical post knee replacement rehabilitation with emphasis on edema control, quadriceps strength and knee flexion > extension active range of motion.   Myer LELON Ivory, PT, MPT 04/05/2024, 4:46 PM

## 2024-04-05 NOTE — Telephone Encounter (Signed)
 Pt had PT today and came downstarirs to ask for refill of oxycodone   5 mg. Please send to [facility] [address]. Pt phone number is [phone]

## 2024-04-06 ENCOUNTER — Telehealth: Payer: Self-pay | Admitting: Surgical

## 2024-04-06 ENCOUNTER — Other Ambulatory Visit: Payer: Self-pay | Admitting: Surgical

## 2024-04-06 MED ORDER — OXYCODONE HCL 5 MG PO TABS: 5.0000 mg | ORAL_TABLET | ORAL | 0 refills | Status: DC | PRN

## 2024-04-06 NOTE — Telephone Encounter (Signed)
 Lvm advising pt.

## 2024-04-06 NOTE — Telephone Encounter (Signed)
 Patient called. Says he went to the pharmacy to pick up his pain medicine  and was told that he can not get it until tomorrow. But he is in pain and needs it today. Would like Luke to call the pharmacy to give ok for today pick up.

## 2024-04-06 NOTE — Telephone Encounter (Signed)
 Sent in refill

## 2024-04-10 ENCOUNTER — Encounter: Payer: Self-pay | Admitting: Rehabilitative and Restorative Service Providers"

## 2024-04-10 ENCOUNTER — Ambulatory Visit (INDEPENDENT_AMBULATORY_CARE_PROVIDER_SITE_OTHER): Payer: Medicare (Managed Care) | Admitting: Rehabilitative and Restorative Service Providers"

## 2024-04-10 DIAGNOSIS — M25562 Pain in left knee: Secondary | ICD-10-CM

## 2024-04-10 DIAGNOSIS — R262 Difficulty in walking, not elsewhere classified: Secondary | ICD-10-CM | POA: Diagnosis not present

## 2024-04-10 DIAGNOSIS — R600 Localized edema: Secondary | ICD-10-CM

## 2024-04-10 DIAGNOSIS — M6281 Muscle weakness (generalized): Secondary | ICD-10-CM | POA: Diagnosis not present

## 2024-04-10 NOTE — Therapy (Signed)
 OUTPATIENT PHYSICAL THERAPY TREATMENT   Patient Name: Richard Walker MRN: [id number] DOB:March 09, 1956, 68 y.o., male Today's Date: 04/10/2024  END OF SESSION:  PT End of Session - 04/10/24 1054     Visit Number 3    Number of Visits 24    Date for PT Re-Evaluation 06/27/24    Authorization Type [employer] 20% coinsurance    Authorization - Number of Visits 33    Progress Note Due on Visit 10    PT Start Time 1055    PT Stop Time 1145    PT Time Calculation (min) 50 min    Activity Tolerance Patient tolerated treatment well    Behavior During Therapy WFL for tasks assessed/performed            Past Medical History:  Diagnosis Date   Arthritis    DDD (degenerative disc disease), lumbar    L3/L4   Diverticulosis    Dysrhythmia    SVT s/p ablation   Dysrhythmia    PAF   GERD (gastroesophageal reflux disease)    Hyperlipidemia    Low sperm motility    Myocardial infarction (HCC)    Pre-diabetes    Sleep apnea    uses cpap    SVT (supraventricular tachycardia) (HCC)    Past Surgical History:  Procedure Laterality Date   AMPUTATION Left 07/15/2018   Procedure: . REPAIR OF LEFT INDEX AND LONG FINGER BY FUSION /  REPAIR OF RING FINGER;  Surgeon: Sebastian Lenis, MD;  Location: [facility] OR;  Service: Orthopedics;  Laterality: Left;   BACK SURGERY     BLALOCK PROCEDURE     Ablation   CARDIAC CATHETERIZATION N/A 12/22/2015   Procedure: Left Heart Cath and Coronary Angiography;  Surgeon: Peter M Swaziland, MD;  Location: [facility] INVASIVE CV LAB;  Service: Cardiovascular;  Laterality: N/A;   CARDIAC ELECTROPHYSIOLOGY STUDY AND ABLATION     ELECTROPHYSIOLOGIC STUDY N/A 02/19/2016   Procedure: SVT Ablation;  Surgeon: Danelle LELON Birmingham, MD;  Location: [facility] INVASIVE CV LAB;  Service: Cardiovascular;  Laterality: N/A;   HAND SURGERY     septal reconstruction  2017   TOTAL KNEE ARTHROPLASTY Left 03/27/2024   Procedure: ARTHROPLASTY, KNEE, TOTAL, LEFT;  Surgeon: Addie Cordella Hamilton, MD;   Location: [facility] OR;  Service: Orthopedics;  Laterality: Left;   TURBINATE REDUCTION Bilateral 07/2016   WISDOM TOOTH EXTRACTION     Patient Active Problem List   Diagnosis Date Noted   S/P TKR (total knee replacement), left 03/29/2024   S/P total knee arthroplasty, left 03/27/2024   Constipation 02/14/2024   Metabolic dysfunction-associated steatotic liver disease (MASLD) 01/13/2024   Abnormal food appetite 11/17/2023   Insulin  resistance 11/17/2023   Class 2 severe obesity due to excess calories with serious comorbidity and body mass index (BMI) of 35.0 to 35.9 in adult [facility]) 11/03/2023   Prediabetes 11/03/2023   Degenerative disc disease, lumbar 12/09/2022   Morbid obesity (HCC) 10/26/2022   Bronchitis 01/29/2022   OSA on CPAP 02/19/2021   Bite, snake 01/28/2021   Neck pain 11/13/2020   Vertigo 11/13/2020   Hypertension 11/03/2020   Contusion of bone 10/23/2020   Degenerative arthritis of knee, bilateral 10/23/2020   Concussion with no loss of consciousness 10/23/2020   Paroxysmal atrial fibrillation (HCC) 05/22/2019   Unspecified atrial flutter (HCC) 05/22/2019   Pain in right foot 03/30/2018   Perennial and seasonal allergic rhinitis 11/09/2017   History of food allergy 11/09/2017   Bronchitis, mucopurulent recurrent (HCC) 09/07/2017  SVT (supraventricular tachycardia) (HCC) 04/23/2015   Abdominal pain, epigastric 06/03/2013   Vitamin D  deficiency 04/09/2013   Esophageal reflux 04/09/2013   Hepatitis B carrier (HCC) 04/09/2013   Other and unspecified hyperlipidemia 04/09/2013   Sleep apnea 04/09/2013    PCP: Dallas Maxwell, PA-C  REFERRING PROVIDER: Cordella Glendia Hutchinson, MD  REFERRING DIAG:  Diagnosis  R53.1 (ICD-10-CM) - Weakness    THERAPY DIAG:  Difficulty in walking, not elsewhere classified  Muscle weakness (generalized)  Localized edema  Acute pain of left knee  Rationale for Evaluation and Treatment: Rehabilitation  ONSET DATE:  03/27/2024  SUBJECTIVE:   SUBJECTIVE STATEMENT: Pt indicated having continued pain complaints, up to 10/10.  Reported pain with movement and at rest.    PERTINENT HISTORY: L3-4 lumbar DDD; pre-diabetes; previous back surgery; cardiac history  PAIN:  NPRS scale: 10/10 Pain location: Lt Knee Pain description: Ache, sore, stiff Aggravating factors: Constantly aggravated Relieving factors: Some relief with medication, but not much  PRECAUTIONS: Back  RED FLAGS: None   WEIGHT BEARING RESTRICTIONS: No  FALLS:  Has patient fallen in last 6 months? No  LIVING ENVIRONMENT: Lives with: lives with their family, lives with their son, and lives with their daughter Lives in: House/apartment Stairs: Avoids stairs, walker and help for stairs now Has following equipment at home: Vannie - 2 wheeled  OCCUPATION: Retired  PLOF: Independent  PATIENT GOALS: Yard work, be more active  NEXT MD VISIT: 04/11/2024  OBJECTIVE:  Note: Objective measures were completed at Evaluation unless otherwise noted.  DIAGNOSTIC FINDINGS: See chart  PATIENT SURVEYS:  PSFS: THE PATIENT SPECIFIC FUNCTIONAL SCALE  Place score of 0-10 (0 = unable to perform activity and 10 = able to perform activity at the same level as before injury or problem)  Activity Date: 04/04/2024    Sleep 0    2.  Sitting 2    3.  Moving around 2    4.  Showering 1    Total Score .125       Total Score = Sum of activity scores/number of activities  Minimally Detectable Change: 3 points (for single activity); 2 points (for average score)   COGNITION: 04/04/2024 Overall cognitive status: Within functional limits for tasks assessed     SENSATION: 04/04/2024 [facility]  EDEMA:  04/04/2024 Noted and not objectively assessed   LOWER EXTREMITY ROM:  Active ROM Left/Right 04/04/2024 Left 04/05/2024 Left 04/10/2024  Hip flexion     Hip extension     Hip abduction     Hip adduction     Hip internal rotation     Hip external  rotation     Knee flexion 62/145 82 with belt supine 100 Supine AROM heel slide  Knee extension -5/0 -3 supine -10 in seated LAQ AROM  Ankle dorsiflexion     Ankle plantarflexion     Ankle inversion     Ankle eversion      (Blank rows = not tested)  LOWER EXTREMITY STRENGTH:  MMT Right 04/04/2024 Left 04/04/2024  Hip flexion    Hip extension    Hip abduction    Hip adduction    Hip internal rotation    Hip external rotation    Knee flexion    Knee extension 4+ 2+  Ankle dorsiflexion    Ankle plantarflexion    Ankle inversion    Ankle eversion     (Blank rows = not tested)  GAIT: 04/04/2024 Distance walked: 50 feet Assistive device utilized: Environmental consultant - 2 wheeled  Level of assistance: Modified independence Comments: Jahquan was assistive device free before surgery                    TREATMENT       DATE: 04/10/2024 Therex: Nustep lvl 5 for ROM of knee, seat 7 UE assist 8 mins  Incline gastroc stretch 30 sec x 3 bilateral  Seated Lt leg LAQ with end range pauses each direction with contralateral leg movement opposite 2.5 lbs 2 x 15 Supine heel slide 5 sec hold Lt x 1  Supine quad set Lt leg 5 sec hold x 10 Education on TKE stretching at home to tolerance for extension gains.   Theractivity (to improve stairs, walking, squatting, transfers) Leg press double leg 100 lbs slow lowering focus in available range  x 15 Leg press single leg Lt 31 lbs slow lowering focus in available range x 15    Manual: Seated Lt knee flexion c distraction/IR mobilization c movement.  Contralateral leg movement opposite.    Vaso: 10 mins Lt knee in elevation 34 degrees high  compression.                                                                                                                                 TREATMENT       DATE: 04/05/2024 Recumbent bike Seat 7 AAROM with PT assist for flexion 8 minutes Quadriceps sets with heel prop 10 x 5 seconds Seated knee extension AROM 10 x 3  seconds Seated knee flexion AAROM (right pushes left into flexion) 5 x 10 seconds Tailgate knee flexion 1 minute Supine knee flexion with strap 5 x 10 seconds with a quad set between reps  Functional Activities: Double Leg Press 100# 12 x slow eccentrics, stretch into flexion and full extension Single Leg Press 25# 10 x slow eccentrics, stretch into flexion and full extension  Vaso Left knee medium pressure 34 degrees 10 minutes   TREATMENT       DATE:04/04/2024 Quadriceps sets with heel prop 10 x 5 seconds Seated knee extension AROM 10 x 3 seconds Seated knee flexion AAROM (right pushes left into flexion) 5 x 10 seconds Tailgate knee flexion 1 minute  [id number]: Discussed the early focus on knee active range of motion (extension and flexion); edema control and quadriceps strength.  Discussed that this is a very uncomfortable process and that Conny will need to push through the pain for an acceptable outcome.  Vaso Left knee medium pressure 34 degrees 10 minutes   PATIENT EDUCATION:  Eval:  Education details: See above Person educated: Patient and Child(ren) Education method: Explanation, Demonstration, Tactile cues, Verbal cues, and Handouts Education comprehension: verbalized understanding, returned demonstration, verbal cues required, tactile cues required, and needs further education  HOME EXERCISE PROGRAM: Access Code: [email or web address] URL: [email or web address] Date: 04/04/2024 Prepared by: Lamar Ivory  Exercises - Seated Knee Flexion AAROM  - 5 x daily - 7 x weekly -  1 sets - 10 reps - 10 seconds hold - Seated Long Arc Quad  - 5 x daily - 7 x weekly - 1 sets - 10 reps - 3 seconds hold - Supine Quadricep Sets  - 5 x daily - 7 x weekly - 2 sets - 10 reps - 5 second hold  ASSESSMENT:  CLINICAL IMPRESSION: Has attended 3 visits with improvement in range as noted.  Pain continued to be a large issue for patient in clinic and at home.  Extension discomfort and  impairment seemed to be more noted than flexion at this time.  Encouraged quad set and TKE stretching prolonged at home to help gain mobility.  Continued skilled PT services indicated at this time.   OBJECTIVE IMPAIRMENTS: Abnormal gait, decreased activity tolerance, decreased endurance, decreased knowledge of condition, difficulty walking, decreased ROM, decreased strength, increased edema, impaired perceived functional ability, and pain.   ACTIVITY LIMITATIONS: bending, sitting, standing, squatting, sleeping, stairs, transfers, bed mobility, dressing, and locomotion level  PARTICIPATION LIMITATIONS: driving, community activity, and yard work  PERSONAL FACTORS: L3-4 lumbar DDD; pre-diabetes; previous back surgery; cardiac history are also affecting patient's functional outcome.    REHAB POTENTIAL: Good  CLINICAL DECISION MAKING: Stable/uncomplicated  EVALUATION COMPLEXITY: Low   GOALS: Goals reviewed with patient? Yes  SHORT TERM GOALS: Target date: 05/16/2024 Waldon will be independent with his day 1 home exercise program Baseline: Started 04/04/2024 Goal status: On Going 04/05/2024  2.  Improve left knee active range of motion to 3 - 0 -100 degrees Baseline: 5 - 0 - 62 degrees Goal status: Partially Met 04/05/2024  3.  Improve left quadriceps strength as assessed by MMT and functional testing Baseline: 2+/5 at evaluation Goal status: INITIAL  4.  Terius will be independent with a single-point cane for ambulation outside the home Baseline: Wheeled walker full-time Goal status: INITIAL   LONG TERM GOALS: Target date: 06/27/2024  Improve patient specific functional score to at least 5 Baseline: 0.125 Goal status: INITIAL  2.  Leander will report left knee pain consistently 0-3/10 on the numeric pain rating scale Baseline: 10/10 Goal status: INITIAL  3.  Improve left knee AROM to 2 - 0 - 110 degrees or better Baseline: 5 - 0 - 62 degrees Goal status: INITIAL  4.  Improve left  quadriceps strength as assessed by MMT and Izack's ability to ambulate without an assistive device 100% of the time Baseline: Wheeled walker full-time Goal status: INITIAL  5.  Krystle will be independent with his long-term maintenance home exercise program at discharge Baseline: Started 04/04/2024 Goal status: INITIAL    PLAN:  PT FREQUENCY: 2-3x/week  PT DURATION: 12 weeks  PLANNED INTERVENTIONS: 97750- Physical Performance Testing, 97110-Therapeutic exercises, 97530- Therapeutic activity, 97112- Neuromuscular re-education, 97535- Self Care, 02859- Manual therapy, [phone]- Gait training, [phone]- Vasopneumatic device, Patient/Family education, Balance training, Stair training, Joint mobilization, and Cryotherapy  PLAN FOR NEXT SESSION: Extension gains, active and passively.  Continued quad strengthening /early balance for [facility] transitioning when able.    Ozell Silvan, PT, DPT, OCS, ATC 04/10/24  11:36 AM

## 2024-04-11 ENCOUNTER — Other Ambulatory Visit (HOSPITAL_BASED_OUTPATIENT_CLINIC_OR_DEPARTMENT_OTHER): Payer: Self-pay

## 2024-04-11 ENCOUNTER — Ambulatory Visit (INDEPENDENT_AMBULATORY_CARE_PROVIDER_SITE_OTHER): Payer: Medicare (Managed Care) | Admitting: Orthopedic Surgery

## 2024-04-11 ENCOUNTER — Other Ambulatory Visit (INDEPENDENT_AMBULATORY_CARE_PROVIDER_SITE_OTHER): Payer: Medicare (Managed Care)

## 2024-04-11 ENCOUNTER — Encounter (INDEPENDENT_AMBULATORY_CARE_PROVIDER_SITE_OTHER): Payer: Self-pay | Admitting: Internal Medicine

## 2024-04-11 ENCOUNTER — Ambulatory Visit (INDEPENDENT_AMBULATORY_CARE_PROVIDER_SITE_OTHER): Payer: Medicare (Managed Care) | Admitting: Internal Medicine

## 2024-04-11 VITALS — BP 127/74 | HR 68 | Ht 68.0 in | Wt 241.0 lb

## 2024-04-11 DIAGNOSIS — Z6835 Body mass index (BMI) 35.0-35.9, adult: Secondary | ICD-10-CM

## 2024-04-11 DIAGNOSIS — E66812 Obesity, class 2: Secondary | ICD-10-CM

## 2024-04-11 DIAGNOSIS — G4733 Obstructive sleep apnea (adult) (pediatric): Secondary | ICD-10-CM

## 2024-04-11 DIAGNOSIS — K760 Fatty (change of) liver, not elsewhere classified: Secondary | ICD-10-CM

## 2024-04-11 DIAGNOSIS — R7303 Prediabetes: Secondary | ICD-10-CM

## 2024-04-11 DIAGNOSIS — Z96652 Presence of left artificial knee joint: Secondary | ICD-10-CM

## 2024-04-11 MED ORDER — OXYCODONE HCL 10 MG PO TABS: ORAL_TABLET | ORAL | 0 refills | Status: DC

## 2024-04-11 MED ORDER — METHOCARBAMOL 500 MG PO TABS: 500.0000 mg | ORAL_TABLET | Freq: Three times a day (TID) | ORAL | 0 refills | Status: DC | PRN

## 2024-04-11 MED ORDER — DOCUSATE SODIUM 100 MG PO CAPS: 100.0000 mg | ORAL_CAPSULE | Freq: Two times a day (BID) | ORAL | 0 refills | Status: DC

## 2024-04-11 MED ORDER — IBUPROFEN 600 MG PO TABS: ORAL_TABLET | ORAL | 0 refills | Status: DC

## 2024-04-11 MED ORDER — GABAPENTIN 100 MG PO CAPS
100.0000 mg | ORAL_CAPSULE | Freq: Two times a day (BID) | ORAL | 0 refills | Status: DC
  Filled 2024-05-31 – 2024-07-02 (×2): qty 42, 21d supply, fill #0

## 2024-04-11 MED ORDER — ZEPBOUND 7.5 MG/0.5ML ~~LOC~~ SOAJ
7.5000 mg | SUBCUTANEOUS | 0 refills | Status: DC
  Filled 2024-04-11: qty 2, 28d supply, fill #0

## 2024-04-11 NOTE — Assessment & Plan Note (Signed)
 Reviewed sleep study from July 2022 he had an AHI of 50 with oxygen desaturations he is currently on PAP therapy but still has an elevated Epworth around 13 despite adjustments in pressures.  I feel that patient will benefit from weight loss aided by GLP-1 to reduce AHI and symptoms related to sleep disordered breathing.  Continue Zepbound  at current dose

## 2024-04-11 NOTE — Progress Notes (Signed)
 Post-Op Visit Note   Patient: Richard Walker           Date of Birth: 11-Apr-1956           MRN: [id number] Visit Date: 04/11/2024 PCP: Dorina Loving, PA-C   Assessment & Plan:  Chief Complaint:  Chief Complaint  Patient presents with   Left Knee - Routine Post Op    03/27/24 left TKA   Visit Diagnoses:  1. Status post total left knee replacement     Plan: Ibrahim is now 2 weeks out left total knee replacement.  Ambulating with a walker.  110 on the CPM machine.  3 hours a day.  Reports severe pain.  On exam range of motion 15-100.  Plan is to increase oxycodone  10 mg every 4 and then taper off.  4-week return with relief.  Gabapentin  added for pain relief.  Continue with Colace.  Ibuprofen  also added.  Muscle relaxer refilled.  No calf tenderness negative Homans today.  Incision intact.  Able to do straight leg raises.  No calf tenderness negative Homans.  Follow-Up Instructions: Return in about 4 weeks (around 05/09/2024).   Orders:  Orders Placed This Encounter  Procedures   XR Knee 1-2 Views Left   No orders of the defined types were placed in this encounter.   Imaging: No results found.  PMFS History: Patient Active Problem List   Diagnosis Date Noted   S/P TKR (total knee replacement), left 03/29/2024   S/P total knee arthroplasty, left 03/27/2024   Constipation 02/14/2024   Metabolic dysfunction-associated steatotic liver disease (MASLD) 01/13/2024   Abnormal food appetite 11/17/2023   Insulin  resistance 11/17/2023   Class 2 severe obesity due to excess calories with serious comorbidity and body mass index (BMI) of 35.0 to 35.9 in adult (HCC) 11/03/2023   Prediabetes 11/03/2023   Degenerative disc disease, lumbar 12/09/2022   Morbid obesity (HCC) 10/26/2022   Bronchitis 01/29/2022   OSA on CPAP 02/19/2021   Bite, snake 01/28/2021   Neck pain 11/13/2020   Vertigo 11/13/2020   Hypertension 11/03/2020   Contusion of bone 10/23/2020   Degenerative arthritis of  knee, bilateral 10/23/2020   Concussion with no loss of consciousness 10/23/2020   Paroxysmal atrial fibrillation (HCC) 05/22/2019   Unspecified atrial flutter (HCC) 05/22/2019   Pain in right foot 03/30/2018   Perennial and seasonal allergic rhinitis 11/09/2017   History of food allergy 11/09/2017   Bronchitis, mucopurulent recurrent (HCC) 09/07/2017   SVT (supraventricular tachycardia) (HCC) 04/23/2015   Abdominal pain, epigastric 06/03/2013   Vitamin D  deficiency 04/09/2013   Esophageal reflux 04/09/2013   Hepatitis B carrier (HCC) 04/09/2013   Other and unspecified hyperlipidemia 04/09/2013   Sleep apnea 04/09/2013   Past Medical History:  Diagnosis Date   Arthritis    DDD (degenerative disc disease), lumbar    L3/L4   Diverticulosis    Dysrhythmia    SVT s/p ablation   Dysrhythmia    PAF   GERD (gastroesophageal reflux disease)    Hyperlipidemia    Low sperm motility    Myocardial infarction (HCC)    Pre-diabetes    Sleep apnea    uses cpap    SVT (supraventricular tachycardia) (HCC)     Family History  Problem Relation Age of Onset   Stroke Mother    Heart disease Mother    High Cholesterol Mother    Sleep apnea Father    Hypertension Father    Food Allergy Son  Colon cancer Neg Hx    Stomach cancer Neg Hx    Allergic rhinitis Neg Hx    Angioedema Neg Hx    Asthma Neg Hx    Eczema Neg Hx    Immunodeficiency Neg Hx    Urticaria Neg Hx     Past Surgical History:  Procedure Laterality Date   AMPUTATION Left 07/15/2018   Procedure: . REPAIR OF LEFT INDEX AND LONG FINGER BY FUSION /  REPAIR OF RING FINGER;  Surgeon: Sebastian Lenis, MD;  Location: [facility] OR;  Service: Orthopedics;  Laterality: Left;   BACK SURGERY     BLALOCK PROCEDURE     Ablation   CARDIAC CATHETERIZATION N/A 12/22/2015   Procedure: Left Heart Cath and Coronary Angiography;  Surgeon: Peter M Swaziland, MD;  Location: [facility] INVASIVE CV LAB;  Service: Cardiovascular;  Laterality: N/A;   CARDIAC  ELECTROPHYSIOLOGY STUDY AND ABLATION     ELECTROPHYSIOLOGIC STUDY N/A 02/19/2016   Procedure: SVT Ablation;  Surgeon: Danelle LELON Birmingham, MD;  Location: [facility] INVASIVE CV LAB;  Service: Cardiovascular;  Laterality: N/A;   HAND SURGERY     septal reconstruction  2017   TOTAL KNEE ARTHROPLASTY Left 03/27/2024   Procedure: ARTHROPLASTY, KNEE, TOTAL, LEFT;  Surgeon: Addie Cordella Hamilton, MD;  Location: [facility] OR;  Service: Orthopedics;  Laterality: Left;   TURBINATE REDUCTION Bilateral 07/2016   WISDOM TOOTH EXTRACTION     Social History   Occupational History   Occupation: retired  Tobacco Use   Smoking status: Former    Current packs/day: 0.00    Average packs/day: 1 pack/day for 10.0 years (10.0 ttl pk-yrs)    Types: Cigarettes    Start date: 4    Quit date: 1992    Years since quitting: 33.6   Smokeless tobacco: Never   Tobacco comments:    Uses Hookah Pipe occasionally  Vaping Use   Vaping status: Never Used  Substance and Sexual Activity   Alcohol use: No   Drug use: No   Sexual activity: Yes

## 2024-04-11 NOTE — Progress Notes (Signed)
 Office: [phone]  /  Fax: [phone]  Weight Summary and Body Composition Analysis (BIA)  Vitals BP: 127/74 Pulse Rate: 68 SpO2: 97 %   Anthropometric Measurements Height: 5' 8 (1.727 m) Weight: 241 lb (109.3 kg) BMI (Calculated): 36.65 Weight at Last Visit: 242lb Weight Lost Since Last Visit: 1 lb Weight Gained Since Last Visit: 0 lb Starting Weight: 264 lb Total Weight Loss (lbs): 23 lb (10.4 kg) Peak Weight: 270 lb   Body Composition  Body Fat %: 36.4 % Fat Mass (lbs): 88 lbs Muscle Mass (lbs): 146 lbs Total Body Water  (lbs): 119 lbs Visceral Fat Rating : 23    RMR: 2405  Today's Visit #: 7  Starting Date: 11/03/23   Subjective   Chief Complaint: Obesity  Interval History Discussed the use of AI scribe software for clinical note transcription with the patient, who gave verbal consent to proceed.  History of Present Illness   Richard Walker is a 68 year old male who presents for medical weight management and post-surgical pain management.  He underwent surgery on August 5th and is experiencing significant post-operative pain. He reports that his surgeon was pleased with the progress of his recovery so far, but the patient himself continues to experience significant pain. He is currently taking oxycodone  for pain management but has not taken it since 2 AM due to needing to drive. He has difficulty sleeping due to pain and experiences burning pain radiating down to his feet.  Since his last visit, he has lost one pound, despite concerns about gaining weight due to reduced activity post-surgery. He has been on a medication regimen for weight management, having completed four doses of 5 mg, and is considering increasing the dose for better appetite control. No nausea or vomiting from the medication, but he has experienced constipation, which was alleviated with a stool softener.  His diet has been adjusted post-surgery, focusing on protein intake with  protein shakes, chicken, and vegetables, while keeping carbohydrates low. He has lost 25 pounds since March, which is approximately 10% of his body weight.  He reports difficulty with physical activity due to post-surgical recovery, particularly with straightening his leg, although he is doing well with bending exercises. He is not currently traveling and is focused on recovery before considering a second surgery, potentially in December.  Socially, he mentions dietary restrictions at home due to his wife's seafood allergy, which limits his consumption of seafood. He enjoys beans and lentils but notes that his wife does not cook them often, leading him to eat out for these foods.       Challenges affecting patient progress: low volume of physical activity at present , orthopedic problems, medical conditions or chronic pain affecting mobility, and medical comorbidities.    Pharmacotherapy for weight management: He is currently taking Zepbound  with adequate clinical response  and experiencing the following side effects: constipation.   Assessment and Plan   Treatment Plan For Obesity:  Recommended Dietary Goals  Richard Walker is currently in the action stage of change. As such, his goal is to continue weight management plan. He has agreed to: continue current plan  Behavioral Health and Counseling  We discussed the following behavioral modification strategies today: increasing lean protein intake to established goals, decreasing simple carbohydrates , increasing vegetables, increasing fiber rich foods, avoiding skipping meals, increasing water  intake , and work on meal planning and preparation.  Additional education and resources provided today: None  Recommended Physical Activity Goals  Richard Walker to work up to 150 minutes of moderate intensity aerobic activity a week and strengthening exercises 2-3 times per week for cardiovascular health, weight loss maintenance and preservation of  muscle mass.   He has agreed to :  Continue with rehabilitation exercises post knee surgery  Medical Interventions and Pharmacotherapy  We discussed various medication options to help Richard Walker with his weight loss efforts and we both agreed to : Increase Zepbound  to 7.5 mg once a week  Associated Conditions Impacted by Obesity Treatment  Assessment & Plan Class 2 severe obesity due to excess calories with serious comorbidity and body mass index (BMI) of 35.0 to 35.9 in adult [facility]) Obesity management is ongoing with a weight loss of 25 pounds since March, approximately 10% of body weight. Current medication is Zepbound , with a recent dose of 5 mg. No nausea or vomiting reported. Appetite control at current dose is insufficient, and he desires to increase the dose. Diet includes protein mix, meat, chicken, and low carbohydrates. Discussed importance of protein for healing post-surgery. - Increase Zepbound  to 7.5 mg - Encourage adherence to a high-protein, low-carbohydrate diet - Recommend incorporating beans, lentils, tofu, Greek yogurt, and cottage cheese into diet - Advise using protein powder with regular yogurt if Richard Walker yogurt is unavailable - Suggest using [name] for high-protein recipes OSA on CPAP Reviewed sleep study from July 2022 he had an AHI of 50 with oxygen desaturations he is currently on PAP therapy but still has an elevated Epworth around 13 despite adjustments in pressures.  I feel that patient will benefit from weight loss aided by GLP-1 to reduce AHI and symptoms related to sleep disordered breathing.  Continue Zepbound  at current dose Prediabetes Based on most recent hemoglobin A1c of 6.0.  He is currently on metformin  XR 500 mg twice a day and Zepbound  and will continue medication.  He will continue to take B12 supplementation.  Metabolic dysfunction-associated steatotic liver disease (MASLD) He has hepatic steatosis on CT scan completed in January of this  year.  Fibrosis 4 Score = 1.3  He does not drink alcohol and is not on steatogenic medication.  Losing 15% of body weight may improve condition.  He is also working on reducing saturated fats simple and processed sugars in his diet and is engaging in physical activity.  Increase Zepbound  to 7.5 mg once a week    Constipation    Objective   Physical Exam:  Blood pressure 127/74, pulse 68, height 5' 8 (1.727 m), weight 241 lb (109.3 kg), SpO2 97%. Body mass index is 36.64 kg/m.  General: He is overweight, cooperative, alert, well developed, and in no acute distress. PSYCH: Has normal mood, affect and thought process.   HEENT: EOMI, sclerae are anicteric. Lungs: Normal breathing effort, no conversational dyspnea. Extremities: No edema.  Neurologic: No gross sensory or motor deficits. No tremors or fasciculations noted.    Diagnostic Data Reviewed:  BMET    Component Value Date/Time   NA 136 03/20/2024 1450   NA 138 11/03/2023 1117   K 4.2 03/20/2024 1450   CL 104 03/20/2024 1450   CO2 23 03/20/2024 1450   GLUCOSE 98 03/20/2024 1450   BUN 15 03/20/2024 1450   BUN 19 11/03/2023 1117   CREATININE 1.06 03/20/2024 1450   CREATININE 1.18 02/12/2016 1508   CALCIUM  9.7 03/20/2024 1450   GFRNONAA >60 03/20/2024 1450   GFRNONAA >89 03/04/2014 1456   GFRAA >60 07/15/2018 1324   GFRAA >89 03/04/2014 1456  Lab Results  Component Value Date   HGBA1C 5.9 01/18/2024   HGBA1C 5.7 12/24/2015   Lab Results  Component Value Date   INSULIN  25.3 (H) 11/03/2023   Lab Results  Component Value Date   TSH 1.81 01/20/2024   CBC    Component Value Date/Time   WBC 6.7 03/20/2024 1450   RBC 5.67 03/20/2024 1450   HGB 16.2 03/20/2024 1450   HCT 48.7 03/20/2024 1450   PLT 237 03/20/2024 1450   MCV 85.9 03/20/2024 1450   MCH 28.6 03/20/2024 1450   MCHC 33.3 03/20/2024 1450   RDW 13.2 03/20/2024 1450   Iron Studies    Component Value Date/Time   IRON 69 06/28/2018 1611    Lipid Panel     Component Value Date/Time   CHOL 209 (H) 01/18/2024 0935   TRIG 212.0 (H) 01/18/2024 0935   HDL 42.40 01/18/2024 0935   CHOLHDL 5 01/18/2024 0935   VLDL 42.4 (H) 01/18/2024 0935   LDLCALC 124 (H) 01/18/2024 0935   LDLDIRECT 150.0 03/10/2023 0951   Hepatic Function Panel     Component Value Date/Time   PROT 6.9 01/18/2024 0935   PROT 6.9 11/03/2023 1117   ALBUMIN 4.3 01/18/2024 0935   ALBUMIN 4.3 11/03/2023 1117   AST 16 01/18/2024 0935   ALT 23 01/18/2024 0935   ALKPHOS 83 01/18/2024 0935   BILITOT 0.4 01/18/2024 0935   BILITOT 0.3 11/03/2023 1117   BILIDIR 0.1 01/06/2017 1600      Component Value Date/Time   TSH 1.81 01/20/2024 1057   Nutritional Lab Results  Component Value Date   VD25OH 55.49 01/20/2024   VD25OH 35.78 09/12/2023   VD25OH 24.68 (L) 03/10/2023    Medications: Outpatient Encounter Medications as of 04/11/2024  Medication Sig   acetaminophen  (TYLENOL ) 325 MG tablet Take 1-2 tablets (325-650 mg total) by mouth every 6 (six) hours as needed for mild pain (pain score 1-3) or fever (or temp > 100.5).   albuterol  (PROAIR  HFA) 108 (90 Base) MCG/ACT inhaler Inhale 1-2 puffs into the lungs every 6 (six) hours as needed for wheezing or shortness of breath.   azelastine  (ASTELIN ) 0.1 % nasal spray 1-2 sprays per nostril 2 times daily as needed.   Budeson-Glycopyrrol-Formoterol  (BREZTRI  AEROSPHERE) 160-9-4.8 MCG/ACT AERO Inhale 2 puffs into the lungs in the morning and at bedtime.   docusate sodium  (COLACE) 100 MG capsule Take 1 capsule (100 mg total) by mouth 2 (two) times daily.   ezetimibe  (ZETIA ) 10 MG tablet Take 1 tablet (10 mg total) by mouth daily.   fenofibrate  (TRICOR ) 145 MG tablet Take 1 tablet (145 mg total) by mouth daily.   flecainide  (TAMBOCOR ) 150 MG tablet Take 150 mg by mouth 2 (two) times daily.   gabapentin  (NEURONTIN ) 100 MG capsule Take 1 capsule (100 mg total) by mouth 2 (two) times daily.   losartan  (COZAAR ) 25 MG  tablet Take 1 tablet (25 mg total) by mouth daily.   Magnesium  250 MG TABS Take 250 mg by mouth every 7 (seven) days.   metFORMIN  (GLUCOPHAGE ) 500 MG tablet TAKE 1 TABLET(500 MG) BY MOUTH TWICE DAILY WITH A MEAL   methocarbamol  (ROBAXIN ) 500 MG tablet Take 1 tablet (500 mg total) by mouth every 6 (six) hours as needed for muscle spasms.   metoprolol  tartrate (LOPRESSOR ) 25 MG tablet Take one tablet by mouth twice a day.  May take one additional tablet by mouth as needed for breakthrough palpitations.   oxyCODONE  (OXY IR/ROXICODONE ) 5 MG immediate  release tablet Take 1 tablet (5 mg total) by mouth every 4 (four) hours as needed for moderate pain (pain score 4-6) (pain score 4-6).   pantoprazole  (PROTONIX ) 40 MG tablet Take 1 tablet (40 mg total) by mouth daily.   PRESCRIPTION MEDICATION Inhale into the lungs at bedtime. CPAP   rivaroxaban  (XARELTO ) 20 MG TABS tablet Take 1 tablet (20 mg total) by mouth daily with supper.   sildenafil  (VIAGRA ) 100 MG tablet Take 1 tablet (100 mg total) by mouth daily as needed for erectile dysfunction.   Spacer/Aero-Holding Chambers (AEROCHAMBER MV) inhaler Use as instructed   tirzepatide  (ZEPBOUND ) 7.5 MG/0.5ML Pen Inject 7.5 mg into the skin once a week.   Vitamin D , Ergocalciferol , (DRISDOL ) 1.25 MG (50000 UNIT) CAPS capsule Take 1 capsule (50,000 Units total) by mouth every 7 (seven) days.   [DISCONTINUED] tirzepatide  (ZEPBOUND ) 5 MG/0.5ML Pen Inject 5 mg into the skin once a week.   No facility-administered encounter medications on file as of 04/11/2024.     Follow-Up   Return in about 4 weeks (around 05/09/2024) for For Weight Mangement with Dr. Francyne.SABRA He was informed of the importance of frequent follow up visits to maximize his success with intensive lifestyle modifications for his multiple health conditions.  Attestation Statement   Reviewed by clinician on day of visit: allergies, medications, problem list, medical history, surgical history, family  history, social history, and previous encounter notes.     Lucas Francyne, MD

## 2024-04-11 NOTE — Assessment & Plan Note (Signed)
 Obesity management is ongoing with a weight loss of 25 pounds since March, approximately 10% of body weight. Current medication is Zepbound , with a recent dose of 5 mg. No nausea or vomiting reported. Appetite control at current dose is insufficient, and he desires to increase the dose. Diet includes protein mix, meat, chicken, and low carbohydrates. Discussed importance of protein for healing post-surgery. - Increase Zepbound  to 7.5 mg - Encourage adherence to a high-protein, low-carbohydrate diet - Recommend incorporating beans, lentils, tofu, Greek yogurt, and cottage cheese into diet - Advise using protein powder with regular yogurt if [name] yogurt is unavailable - Suggest using [name] for high-protein recipes

## 2024-04-11 NOTE — Assessment & Plan Note (Signed)
 Based on most recent hemoglobin A1c of 6.0.  He is currently on metformin  XR 500 mg twice a day and Zepbound  and will continue medication.  He will continue to take B12 supplementation.

## 2024-04-11 NOTE — Assessment & Plan Note (Signed)
 He has hepatic steatosis on CT scan completed in January of this year.  Fibrosis 4 Score = 1.3  He does not drink alcohol and is not on steatogenic medication.  Losing 15% of body weight may improve condition.  He is also working on reducing saturated fats simple and processed sugars in his diet and is engaging in physical activity.  Increase Zepbound  to 7.5 mg once a week

## 2024-04-12 ENCOUNTER — Encounter: Payer: Self-pay | Admitting: Orthopedic Surgery

## 2024-04-13 ENCOUNTER — Ambulatory Visit (INDEPENDENT_AMBULATORY_CARE_PROVIDER_SITE_OTHER): Payer: Medicare (Managed Care) | Admitting: Physical Therapy

## 2024-04-13 ENCOUNTER — Encounter: Payer: Self-pay | Admitting: Orthopedic Surgery

## 2024-04-13 ENCOUNTER — Encounter: Payer: Self-pay | Admitting: Physical Therapy

## 2024-04-13 ENCOUNTER — Other Ambulatory Visit (HOSPITAL_BASED_OUTPATIENT_CLINIC_OR_DEPARTMENT_OTHER): Payer: Self-pay

## 2024-04-13 DIAGNOSIS — M25562 Pain in left knee: Secondary | ICD-10-CM

## 2024-04-13 DIAGNOSIS — M6281 Muscle weakness (generalized): Secondary | ICD-10-CM | POA: Diagnosis not present

## 2024-04-13 DIAGNOSIS — R600 Localized edema: Secondary | ICD-10-CM | POA: Diagnosis not present

## 2024-04-13 DIAGNOSIS — R262 Difficulty in walking, not elsewhere classified: Secondary | ICD-10-CM | POA: Diagnosis not present

## 2024-04-13 NOTE — Therapy (Signed)
 OUTPATIENT PHYSICAL THERAPY TREATMENT   Patient Name: KAMAR CALLENDER MRN: [id number] DOB:June 06, 1956, 68 y.o., male Today's Date: 04/13/2024  END OF SESSION:  PT End of Session - 04/13/24 1016     Visit Number 4    Number of Visits 24    Date for PT Re-Evaluation 06/27/24    Authorization Type [employer] 20% coinsurance    Authorization - Number of Visits 33    Progress Note Due on Visit 10    PT Start Time 1013    PT Stop Time 1105    PT Time Calculation (min) 52 min    Activity Tolerance Patient tolerated treatment well    Behavior During Therapy WFL for tasks assessed/performed             Past Medical History:  Diagnosis Date   Arthritis    DDD (degenerative disc disease), lumbar    L3/L4   Diverticulosis    Dysrhythmia    SVT s/p ablation   Dysrhythmia    PAF   GERD (gastroesophageal reflux disease)    Hyperlipidemia    Low sperm motility    Myocardial infarction (HCC)    Pre-diabetes    Sleep apnea    uses cpap    SVT (supraventricular tachycardia) (HCC)    Past Surgical History:  Procedure Laterality Date   AMPUTATION Left 07/15/2018   Procedure: . REPAIR OF LEFT INDEX AND LONG FINGER BY FUSION /  REPAIR OF RING FINGER;  Surgeon: Sebastian Lenis, MD;  Location: [facility] OR;  Service: Orthopedics;  Laterality: Left;   BACK SURGERY     BLALOCK PROCEDURE     Ablation   CARDIAC CATHETERIZATION N/A 12/22/2015   Procedure: Left Heart Cath and Coronary Angiography;  Surgeon: Peter M Swaziland, MD;  Location: [facility] INVASIVE CV LAB;  Service: Cardiovascular;  Laterality: N/A;   CARDIAC ELECTROPHYSIOLOGY STUDY AND ABLATION     ELECTROPHYSIOLOGIC STUDY N/A 02/19/2016   Procedure: SVT Ablation;  Surgeon: Danelle LELON Birmingham, MD;  Location: [facility] INVASIVE CV LAB;  Service: Cardiovascular;  Laterality: N/A;   HAND SURGERY     septal reconstruction  2017   TOTAL KNEE ARTHROPLASTY Left 03/27/2024   Procedure: ARTHROPLASTY, KNEE, TOTAL, LEFT;  Surgeon: Addie Cordella Hamilton, MD;   Location: [facility] OR;  Service: Orthopedics;  Laterality: Left;   TURBINATE REDUCTION Bilateral 07/2016   WISDOM TOOTH EXTRACTION     Patient Active Problem List   Diagnosis Date Noted   S/P TKR (total knee replacement), left 03/29/2024   S/P total knee arthroplasty, left 03/27/2024   Constipation 02/14/2024   Metabolic dysfunction-associated steatotic liver disease (MASLD) 01/13/2024   Abnormal food appetite 11/17/2023   Insulin  resistance 11/17/2023   Class 2 severe obesity due to excess calories with serious comorbidity and body mass index (BMI) of 35.0 to 35.9 in adult [facility]) 11/03/2023   Prediabetes 11/03/2023   Degenerative disc disease, lumbar 12/09/2022   Morbid obesity (HCC) 10/26/2022   Bronchitis 01/29/2022   OSA on CPAP 02/19/2021   Bite, snake 01/28/2021   Neck pain 11/13/2020   Vertigo 11/13/2020   Hypertension 11/03/2020   Contusion of bone 10/23/2020   Degenerative arthritis of knee, bilateral 10/23/2020   Concussion with no loss of consciousness 10/23/2020   Paroxysmal atrial fibrillation (HCC) 05/22/2019   Unspecified atrial flutter (HCC) 05/22/2019   Pain in right foot 03/30/2018   Perennial and seasonal allergic rhinitis 11/09/2017   History of food allergy 11/09/2017   Bronchitis, mucopurulent recurrent (HCC) 09/07/2017  SVT (supraventricular tachycardia) (HCC) 04/23/2015   Abdominal pain, epigastric 06/03/2013   Vitamin D  deficiency 04/09/2013   Esophageal reflux 04/09/2013   Hepatitis B carrier (HCC) 04/09/2013   Other and unspecified hyperlipidemia 04/09/2013   Sleep apnea 04/09/2013    PCP: Dallas Maxwell, PA-C  REFERRING PROVIDER: Cordella Glendia Hutchinson, MD  REFERRING DIAG:  Diagnosis  R53.1 (ICD-10-CM) - Weakness    THERAPY DIAG:  Difficulty in walking, not elsewhere classified  Muscle weakness (generalized)  Localized edema  Acute pain of left knee  Rationale for Evaluation and Treatment: Rehabilitation  ONSET DATE:  03/27/2024  SUBJECTIVE:   SUBJECTIVE STATEMENT: Hasn't picked up his medication yet so his pain is still elevated.    PERTINENT HISTORY: L3-4 lumbar DDD; pre-diabetes; previous back surgery; cardiac history  PAIN:  NPRS scale: 10/10 Pain location: Lt Knee Pain description: Ache, sore, stiff Aggravating factors: Constantly aggravated Relieving factors: Some relief with medication, but not much  PRECAUTIONS: Back  RED FLAGS: None   WEIGHT BEARING RESTRICTIONS: No  FALLS:  Has patient fallen in last 6 months? No  LIVING ENVIRONMENT: Lives with: lives with their family, lives with their son, and lives with their daughter Lives in: House/apartment Stairs: Avoids stairs, walker and help for stairs now Has following equipment at home: Vannie - 2 wheeled  OCCUPATION: Retired  PLOF: Independent  PATIENT GOALS: Yard work, be more active    OBJECTIVE:  Note: Objective measures were completed at Evaluation unless otherwise noted.  DIAGNOSTIC FINDINGS: See chart  PATIENT SURVEYS:  PSFS: THE PATIENT SPECIFIC FUNCTIONAL SCALE  Place score of 0-10 (0 = unable to perform activity and 10 = able to perform activity at the same level as before injury or problem)  Activity Date: 04/04/2024    Sleep 0    2.  Sitting 2    3.  Moving around 2    4.  Showering 1    Total Score .125       Total Score = Sum of activity scores/number of activities  Minimally Detectable Change: 3 points (for single activity); 2 points (for average score)   COGNITION: 04/04/2024 Overall cognitive status: Within functional limits for tasks assessed     SENSATION: 04/04/2024 [facility]  EDEMA:  04/04/2024 Noted and not objectively assessed   LOWER EXTREMITY ROM:  Active ROM Left/Right 04/04/2024 Left 04/05/2024 Left 04/10/2024 04/13/24  Knee flexion 62/145 82 with belt supine 100 Supine AROM heel slide P: 100 (guarding)  Knee extension -5/0 -3 supine -10 in seated LAQ AROM A: -7 (seated LAQ)    (Blank rows = not tested)  LOWER EXTREMITY STRENGTH:  MMT Right 04/04/2024 Left 04/04/2024  Hip flexion    Hip extension    Hip abduction    Hip adduction    Hip internal rotation    Hip external rotation    Knee flexion    Knee extension 4+ 2+  Ankle dorsiflexion    Ankle plantarflexion    Ankle inversion    Ankle eversion     (Blank rows = not tested)  GAIT: 04/04/2024 Distance walked: 50 feet Assistive device utilized: Environmental consultant - 2 wheeled Level of assistance: Modified independence Comments: Zebadiah was assistive device free before surgery                    TREATMENT 04/13/24 TherAct (working ROM, strength to improve functional activities) NuStep L5 x 10 min Seated LAQ with 3 sec hold on Lt; 4#; 3x10  Leg Press bil 100#  3x10, then LLE only 37# 3x10  TherEx Lt knee hamstring stretch with overpressure at distal thigh 3x30 sec ROM measurements - see above for details  Gait Training Amb with SPC with quad tip minguard A progressing to supervision with mod cues for sequencing and step length throughout clinic between exercises  Manual Vaso x 10 min; high pressure in elevation to Lt knee; 34 deg  04/10/2024 Therex: Nustep lvl 5 for ROM of knee, seat 7 UE assist 8 mins  Incline gastroc stretch 30 sec x 3 bilateral  Seated Lt leg LAQ with end range pauses each direction with contralateral leg movement opposite 2.5 lbs 2 x 15 Supine heel slide 5 sec hold Lt x 1  Supine quad set Lt leg 5 sec hold x 10 Education on TKE stretching at home to tolerance for extension gains.   Theractivity (to improve stairs, walking, squatting, transfers) Leg press double leg 100 lbs slow lowering focus in available range  x 15 Leg press single leg Lt 31 lbs slow lowering focus in available range x 15    Manual: Seated Lt knee flexion c distraction/IR mobilization c movement.  Contralateral leg movement opposite.    Vaso: 10 mins Lt knee in elevation 34 degrees high  compression.                                                                                                                                  04/05/2024 Recumbent bike Seat 7 AAROM with PT assist for flexion 8 minutes Quadriceps sets with heel prop 10 x 5 seconds Seated knee extension AROM 10 x 3 seconds Seated knee flexion AAROM (right pushes left into flexion) 5 x 10 seconds Tailgate knee flexion 1 minute Supine knee flexion with strap 5 x 10 seconds with a quad set between reps  Functional Activities: Double Leg Press 100# 12 x slow eccentrics, stretch into flexion and full extension Single Leg Press 25# 10 x slow eccentrics, stretch into flexion and full extension  Vaso Left knee medium pressure 34 degrees 10 minutes   04/04/2024 Quadriceps sets with heel prop 10 x 5 seconds Seated knee extension AROM 10 x 3 seconds Seated knee flexion AAROM (right pushes left into flexion) 5 x 10 seconds Tailgate knee flexion 1 minute  [id number]: Discussed the early focus on knee active range of motion (extension and flexion); edema control and quadriceps strength.  Discussed that this is a very uncomfortable process and that Keni will need to push through the pain for an acceptable outcome.  Vaso Left knee medium pressure 34 degrees 10 minutes   PATIENT EDUCATION:  Eval:  Education details: See above Person educated: Patient and Child(ren) Education method: Explanation, Demonstration, Tactile cues, Verbal cues, and Handouts Education comprehension: verbalized understanding, returned demonstration, verbal cues required, tactile cues required, and needs further education  HOME EXERCISE PROGRAM: Access Code: [email or web address] URL: [email or web address] Date: 04/04/2024 Prepared by: Lamar Ivory  Exercises -  Seated Knee Flexion AAROM  - 5 x daily - 7 x weekly - 1 sets - 10 reps - 10 seconds hold - Seated Long Arc Quad  - 5 x daily - 7 x weekly - 1 sets - 10 reps - 3 seconds hold - Supine Quadricep Sets  - 5  x daily - 7 x weekly - 2 sets - 10 reps - 5 second hold  ASSESSMENT:  CLINICAL IMPRESSION: Pt tolerated session well today and is safe to amb household distances with [facility] and limited community distances.  Continue skilled PT at this time.  OBJECTIVE IMPAIRMENTS: Abnormal gait, decreased activity tolerance, decreased endurance, decreased knowledge of condition, difficulty walking, decreased ROM, decreased strength, increased edema, impaired perceived functional ability, and pain.   ACTIVITY LIMITATIONS: bending, sitting, standing, squatting, sleeping, stairs, transfers, bed mobility, dressing, and locomotion level  PARTICIPATION LIMITATIONS: driving, community activity, and yard work  PERSONAL FACTORS: L3-4 lumbar DDD; pre-diabetes; previous back surgery; cardiac history are also affecting patient's functional outcome.    REHAB POTENTIAL: Good  CLINICAL DECISION MAKING: Stable/uncomplicated  EVALUATION COMPLEXITY: Low   GOALS: Goals reviewed with patient? Yes  SHORT TERM GOALS: Target date: 05/16/2024 Leovanni will be independent with his day 1 home exercise program Baseline: Started 04/04/2024 Goal status: On Going 04/05/2024  2.  Improve left knee active range of motion to 3 - 0 -100 degrees Baseline: 5 - 0 - 62 degrees Goal status: Partially Met 04/05/2024  3.  Improve left quadriceps strength as assessed by MMT and functional testing Baseline: 2+/5 at evaluation Goal status: INITIAL  4.  Berwyn will be independent with a single-point cane for ambulation outside the home Baseline: Wheeled walker full-time Goal status: INITIAL   LONG TERM GOALS: Target date: 06/27/2024  Improve patient specific functional score to at least 5 Baseline: 0.125 Goal status: INITIAL  2.  Reilley will report left knee pain consistently 0-3/10 on the numeric pain rating scale Baseline: 10/10 Goal status: INITIAL  3.  Improve left knee AROM to 2 - 0 - 110 degrees or better Baseline: 5 - 0 - 62  degrees Goal status: INITIAL  4.  Improve left quadriceps strength as assessed by MMT and Radin's ability to ambulate without an assistive device 100% of the time Baseline: Wheeled walker full-time Goal status: INITIAL  5.  Marcelo will be independent with his long-term maintenance home exercise program at discharge Baseline: Started 04/04/2024 Goal status: INITIAL    PLAN:  PT FREQUENCY: 2-3x/week  PT DURATION: 12 weeks  PLANNED INTERVENTIONS: [id number]- Physical Performance Testing, 97110-Therapeutic exercises, 97530- Therapeutic activity, 97112- Neuromuscular re-education, 97535- Self Care, [id number]- Manual therapy, [phone]- Gait training, [phone]- Vasopneumatic device, Patient/Family education, Balance training, Stair training, Joint mobilization, and Cryotherapy  PLAN FOR NEXT SESSION: continue extension focus, active and passively.  Continued quad strengthening /early balance for [facility] transitioning when able.    NEXT MD VISIT: 05/14/2024   Corean JULIANNA Ku, PT, DPT 04/13/24 11:00 AM

## 2024-04-15 DIAGNOSIS — M1712 Unilateral primary osteoarthritis, left knee: Secondary | ICD-10-CM

## 2024-04-17 ENCOUNTER — Encounter: Payer: Self-pay | Admitting: Physical Therapy

## 2024-04-17 ENCOUNTER — Ambulatory Visit (INDEPENDENT_AMBULATORY_CARE_PROVIDER_SITE_OTHER): Payer: Medicare (Managed Care) | Admitting: Physical Therapy

## 2024-04-17 ENCOUNTER — Ambulatory Visit: Payer: Medicare (Managed Care) | Admitting: Physical Therapy

## 2024-04-17 DIAGNOSIS — M25662 Stiffness of left knee, not elsewhere classified: Secondary | ICD-10-CM

## 2024-04-17 DIAGNOSIS — R600 Localized edema: Secondary | ICD-10-CM

## 2024-04-17 DIAGNOSIS — R262 Difficulty in walking, not elsewhere classified: Secondary | ICD-10-CM | POA: Diagnosis not present

## 2024-04-17 DIAGNOSIS — M6281 Muscle weakness (generalized): Secondary | ICD-10-CM

## 2024-04-17 DIAGNOSIS — M25562 Pain in left knee: Secondary | ICD-10-CM

## 2024-04-17 NOTE — Therapy (Signed)
 OUTPATIENT PHYSICAL THERAPY TREATMENT   Patient Name: Richard Walker MRN: [id number] DOB:01/14/1956, 68 y.o., male Today's Date: 04/17/2024  END OF SESSION:  PT End of Session - 04/17/24 1257     Visit Number 5    Number of Visits 24    Date for PT Re-Evaluation 06/27/24    Authorization Type [employer] 20% coinsurance    Authorization - Number of Visits 33    Progress Note Due on Visit 10    PT Start Time 1257    PT Stop Time 1353    PT Time Calculation (min) 56 min    Activity Tolerance Patient tolerated treatment well    Behavior During Therapy WFL for tasks assessed/performed              Past Medical History:  Diagnosis Date   Arthritis    DDD (degenerative disc disease), lumbar    L3/L4   Diverticulosis    Dysrhythmia    SVT s/p ablation   Dysrhythmia    PAF   GERD (gastroesophageal reflux disease)    Hyperlipidemia    Low sperm motility    Myocardial infarction (HCC)    Pre-diabetes    Sleep apnea    uses cpap    SVT (supraventricular tachycardia) (HCC)    Past Surgical History:  Procedure Laterality Date   AMPUTATION Left 07/15/2018   Procedure: . REPAIR OF LEFT INDEX AND LONG FINGER BY FUSION /  REPAIR OF RING FINGER;  Surgeon: Sebastian Lenis, MD;  Location: [facility] OR;  Service: Orthopedics;  Laterality: Left;   BACK SURGERY     BLALOCK PROCEDURE     Ablation   CARDIAC CATHETERIZATION N/A 12/22/2015   Procedure: Left Heart Cath and Coronary Angiography;  Surgeon: Peter M Swaziland, MD;  Location: [facility] INVASIVE CV LAB;  Service: Cardiovascular;  Laterality: N/A;   CARDIAC ELECTROPHYSIOLOGY STUDY AND ABLATION     ELECTROPHYSIOLOGIC STUDY N/A 02/19/2016   Procedure: SVT Ablation;  Surgeon: Danelle LELON Birmingham, MD;  Location: [facility] INVASIVE CV LAB;  Service: Cardiovascular;  Laterality: N/A;   HAND SURGERY     septal reconstruction  2017   TOTAL KNEE ARTHROPLASTY Left 03/27/2024   Procedure: ARTHROPLASTY, KNEE, TOTAL, LEFT;  Surgeon: Addie Cordella Hamilton, MD;   Location: [facility] OR;  Service: Orthopedics;  Laterality: Left;   TURBINATE REDUCTION Bilateral 07/2016   WISDOM TOOTH EXTRACTION     Patient Active Problem List   Diagnosis Date Noted   Arthritis of left knee 04/15/2024   S/P TKR (total knee replacement), left 03/29/2024   S/P total knee arthroplasty, left 03/27/2024   Constipation 02/14/2024   Metabolic dysfunction-associated steatotic liver disease (MASLD) 01/13/2024   Abnormal food appetite 11/17/2023   Insulin  resistance 11/17/2023   Class 2 severe obesity due to excess calories with serious comorbidity and body mass index (BMI) of 35.0 to 35.9 in adult [facility]) 11/03/2023   Prediabetes 11/03/2023   Degenerative disc disease, lumbar 12/09/2022   Morbid obesity (HCC) 10/26/2022   Bronchitis 01/29/2022   OSA on CPAP 02/19/2021   Bite, snake 01/28/2021   Neck pain 11/13/2020   Vertigo 11/13/2020   Hypertension 11/03/2020   Contusion of bone 10/23/2020   Degenerative arthritis of knee, bilateral 10/23/2020   Concussion with no loss of consciousness 10/23/2020   Paroxysmal atrial fibrillation (HCC) 05/22/2019   Unspecified atrial flutter (HCC) 05/22/2019   Pain in right foot 03/30/2018   Perennial and seasonal allergic rhinitis 11/09/2017   History of food allergy 11/09/2017  Bronchitis, mucopurulent recurrent (HCC) 09/07/2017   SVT (supraventricular tachycardia) (HCC) 04/23/2015   Abdominal pain, epigastric 06/03/2013   Vitamin D  deficiency 04/09/2013   Esophageal reflux 04/09/2013   Hepatitis B carrier (HCC) 04/09/2013   Other and unspecified hyperlipidemia 04/09/2013   Sleep apnea 04/09/2013    PCP: Dallas Maxwell, PA-C  REFERRING PROVIDER: Cordella Glendia Hutchinson, MD  REFERRING DIAG:  Diagnosis  R53.1 (ICD-10-CM) - Weakness    THERAPY DIAG:  Difficulty in walking, not elsewhere classified  Muscle weakness (generalized)  Localized edema  Acute pain of left knee  Knee stiffness, left  Rationale for Evaluation and  Treatment: Rehabilitation  ONSET DATE: 03/27/2024  SUBJECTIVE:   SUBJECTIVE STATEMENT: He has been using cane in kitchen and living room but walker to get to bathroom & around bedroom.    PERTINENT HISTORY: L3-4 lumbar DDD; pre-diabetes; previous back surgery; cardiac history  PAIN:  NPRS scale: at rest 8.5-10/10,  standing 9-10/10, walking 8.5-10/10,  bending /exercising 10/10 Pain location: Lt Knee Pain description: Ache, sore, stiff Aggravating factors: Constantly aggravated Relieving factors: Some relief with medication, but not much  PRECAUTIONS: Back  RED FLAGS: None   WEIGHT BEARING RESTRICTIONS: No  FALLS:  Has patient fallen in last 6 months? No  LIVING ENVIRONMENT: Lives with: lives with their family, lives with their son, and lives with their daughter Lives in: House/apartment Stairs: Avoids stairs, walker and help for stairs now Has following equipment at home: Vannie - 2 wheeled  OCCUPATION: Retired  PLOF: Independent  PATIENT GOALS: Yard work, be more active   OBJECTIVE:  Note: Objective measures were completed at Evaluation unless otherwise noted.  DIAGNOSTIC FINDINGS: See chart  PATIENT SURVEYS:  PSFS: THE PATIENT SPECIFIC FUNCTIONAL SCALE  Place score of 0-10 (0 = unable to perform activity and 10 = able to perform activity at the same level as before injury or problem)  Activity Date: 04/04/2024    Sleep 0    2.  Sitting 2    3.  Moving around 2    4.  Showering 1    Total Score .125       Total Score = Sum of activity scores/number of activities  Minimally Detectable Change: 3 points (for single activity); 2 points (for average score)   COGNITION: 04/04/2024 Overall cognitive status: Within functional limits for tasks assessed     SENSATION: 04/04/2024 [facility]  EDEMA:  04/04/2024 Noted and not objectively assessed   LOWER EXTREMITY ROM:  Active ROM Left/Right 04/04/2024 Left  04/05/2024 Left 04/10/2024 04/13/24 04/17/24  Knee  flexion 62/145 82 with belt supine 100 Supine AROM heel slide P: 100 (guarding) Seated P: 112* Supine  A: 96*  Knee extension -5/0 -3 supine -10 in seated LAQ AROM A: -7 (seated LAQ) LAQ A: -5* Standing A: -3*   (Blank rows = not tested)  LOWER EXTREMITY STRENGTH:  MMT Right  04/04/2024 Left 04/04/2024  Hip flexion    Hip extension    Hip abduction    Hip adduction    Hip internal rotation    Hip external rotation    Knee flexion    Knee extension 4+ 2+  Ankle dorsiflexion    Ankle plantarflexion    Ankle inversion    Ankle eversion     (Blank rows = not tested)  GAIT: 04/04/2024 Distance walked: 50 feet Assistive device utilized: Environmental consultant - 2 wheeled Level of assistance: Modified independence Comments: Marquavis was assistive device free before surgery  TREATMENT 04/17/2024 TherAct (working ROM, strength to improve functional activities) Leg press BLEs 100# 10 reps 2 sets with PT cues on technique; LLE only 43# 10 reps. Seated LAQ with 3 sec hold on Lt; 4#; 3x10 cues for controlled eccentric motion. Seated hamstring curl green theraband Level 3 10 reps 3 sets with cues for controlled eccentric motion PT demo & verbal cues on weight shift on LLE upon arising 5 reps.  Pt return demo and PT noted less antalgic gait especially initial 5-10 steps.   Standing stepping over 6 hurdle with cane & table support during stepping over motion then static stance 5 sec alternating LEs for 5 reps ea LE. Standing TKE with green theraband level 3 10 reps 2 sets with cane support  TherEx SciFit bike seat 11 level 1 full revolutions with BLEs only 8 min.  Working on functional rhythmic flexion / extension.  Supine heel slide on ball with strap with flexion stretch 5 sec and knee ext / leg press 5 sec for 10 reps 2 sets.  Manual Therapy PROM with overpressure / distraction with tibial internal rotation for knee flexion seated.   Gait Training Amb with SPC with std tip with  cues on upright posture and pelvic weight shift over LLE in stance.   Manual Vaso x 10 min; high pressure in elevation to Lt knee; 34 deg    TREATMENT 04/13/24 TherAct (working ROM, strength to improve functional activities) NuStep L5 x 10 min Seated LAQ with 3 sec hold on Lt; 4#; 3x10  Leg Press bil 100# 3x10, then LLE only 37# 3x10  TherEx Lt knee hamstring stretch with overpressure at distal thigh 3x30 sec ROM measurements - see above for details  Gait Training Amb with SPC with quad tip minguard A progressing to supervision with mod cues for sequencing and step length throughout clinic between exercises  Manual Vaso x 10 min; high pressure in elevation to Lt knee; 34 deg    04/10/2024 Therex: Nustep lvl 5 for ROM of knee, seat 7 UE assist 8 mins  Incline gastroc stretch 30 sec x 3 bilateral  Seated Lt leg LAQ with end range pauses each direction with contralateral leg movement opposite 2.5 lbs 2 x 15 Supine heel slide 5 sec hold Lt x 1  Supine quad set Lt leg 5 sec hold x 10 Education on TKE stretching at home to tolerance for extension gains.   Theractivity (to improve stairs, walking, squatting, transfers) Leg press double leg 100 lbs slow lowering focus in available range  x 15 Leg press single leg Lt 31 lbs slow lowering focus in available range x 15    Manual: Seated Lt knee flexion c distraction/IR mobilization c movement.  Contralateral leg movement opposite.    Vaso: 10 mins Lt knee in elevation 34 degrees high  compression.        PATIENT EDUCATION:  Eval:  Education details: See above Person educated: Patient and Child(ren) Education method: Explanation, Demonstration, Tactile cues, Verbal cues, and Handouts Education comprehension: verbalized understanding, returned demonstration, verbal cues required, tactile cues required, and needs further education  HOME EXERCISE PROGRAM: Access Code: [email or web address] URL: [email or web address] Date:  04/04/2024 Prepared by: Lamar Ivory  Exercises - Seated Knee Flexion AAROM  - 5 x daily - 7 x weekly - 1 sets - 10 reps - 10 seconds hold - Seated Long Arc Quad  - 5 x daily - 7 x weekly - 1 sets - 10 reps - 3 seconds hold -  Supine Quadricep Sets  - 5 x daily - 7 x weekly - 2 sets - 10 reps - 5 second hold  ASSESSMENT:  CLINICAL IMPRESSION: Patient improved stance duration with PT instruction and therapeutic activities.  He is still rating pain high numbers but non-verbal does not indicate quite that high.  He is reporting that his CPM is set to 130* but does not have the PROM to support that much.  This could be part of high pain ratings.  Continue skilled PT at this time.  OBJECTIVE IMPAIRMENTS: Abnormal gait, decreased activity tolerance, decreased endurance, decreased knowledge of condition, difficulty walking, decreased ROM, decreased strength, increased edema, impaired perceived functional ability, and pain.   ACTIVITY LIMITATIONS: bending, sitting, standing, squatting, sleeping, stairs, transfers, bed mobility, dressing, and locomotion level  PARTICIPATION LIMITATIONS: driving, community activity, and yard work  PERSONAL FACTORS: L3-4 lumbar DDD; pre-diabetes; previous back surgery; cardiac history are also affecting patient's functional outcome.    REHAB POTENTIAL: Good  CLINICAL DECISION MAKING: Stable/uncomplicated  EVALUATION COMPLEXITY: Low   GOALS: Goals reviewed with patient? Yes  SHORT TERM GOALS: Target date: 05/16/2024 Iktan will be independent with his day 1 home exercise program Baseline: Started 04/04/2024 Goal status: Ongoing   04/17/2024  2.  Improve left knee active range of motion to 3 - 0 -100 degrees Baseline: 5 - 0 - 62 degrees Goal status: Ongoing   04/17/2024  3.  Improve left quadriceps strength as assessed by MMT and functional testing Baseline: 2+/5 at evaluation Goal status: Ongoing   04/17/2024  4.  Mandrell will be independent with a single-point  cane for ambulation outside the home Baseline: Wheeled walker full-time Goal status: Ongoing   04/17/2024   LONG TERM GOALS: Target date: 06/27/2024  Improve patient specific functional score to at least 5 Baseline: 0.125 Goal status: Ongoing   04/17/2024  2.  Jamarkus will report left knee pain consistently 0-3/10 on the numeric pain rating scale Baseline: 10/10 Goal status: Ongoing   04/17/2024  3.  Improve left knee AROM to 2 - 0 - 110 degrees or better Baseline: 5 - 0 - 62 degrees Goal status: Ongoing   04/17/2024  4.  Improve left quadriceps strength as assessed by MMT and Kylo's ability to ambulate without an assistive device 100% of the time Baseline: Wheeled walker full-time Goal status: Ongoing   04/17/2024  5.  Coben will be independent with his long-term maintenance home exercise program at discharge Baseline: Started 04/04/2024 Goal status: Ongoing   04/17/2024    PLAN:  PT FREQUENCY: 2-3x/week  PT DURATION: 12 weeks  PLANNED INTERVENTIONS: 97750- Physical Performance Testing, 97110-Therapeutic exercises, 97530- Therapeutic activity, 97112- Neuromuscular re-education, 97535- Self Care, [id number]- Manual therapy, [phone]- Gait training, [phone]- Vasopneumatic device, Patient/Family education, Balance training, Stair training, Joint mobilization, and Cryotherapy  PLAN FOR NEXT SESSION:    continue extension focus, active and passively.  Functional activities for balance & strength.  Vaso for edema.   NEXT MD VISIT: 05/14/2024   Grayce Spatz, PT, DPT 04/17/2024, 1:53 PM

## 2024-04-19 ENCOUNTER — Ambulatory Visit (INDEPENDENT_AMBULATORY_CARE_PROVIDER_SITE_OTHER): Payer: Medicare (Managed Care) | Admitting: Physical Therapy

## 2024-04-19 ENCOUNTER — Encounter: Payer: Self-pay | Admitting: Physical Therapy

## 2024-04-19 DIAGNOSIS — M6281 Muscle weakness (generalized): Secondary | ICD-10-CM

## 2024-04-19 DIAGNOSIS — M25562 Pain in left knee: Secondary | ICD-10-CM | POA: Diagnosis not present

## 2024-04-19 DIAGNOSIS — R262 Difficulty in walking, not elsewhere classified: Secondary | ICD-10-CM | POA: Diagnosis not present

## 2024-04-19 DIAGNOSIS — R600 Localized edema: Secondary | ICD-10-CM | POA: Diagnosis not present

## 2024-04-19 DIAGNOSIS — M25662 Stiffness of left knee, not elsewhere classified: Secondary | ICD-10-CM

## 2024-04-19 NOTE — Therapy (Signed)
 OUTPATIENT PHYSICAL THERAPY TREATMENT   Patient Name: Richard Walker MRN: [id number] DOB:May 15, 1956, 68 y.o., male Today's Date: 04/19/2024  END OF SESSION:  PT End of Session - 04/19/24 1153     Visit Number 6    Number of Visits 24    Date for PT Re-Evaluation 06/27/24    Authorization Type [employer] 20% coinsurance    Authorization - Number of Visits 33    Progress Note Due on Visit 10    PT Start Time 1145    PT Stop Time 1228    PT Time Calculation (min) 43 min    Activity Tolerance Patient tolerated treatment well;Patient limited by pain    Behavior During Therapy WFL for tasks assessed/performed               Past Medical History:  Diagnosis Date   Arthritis    DDD (degenerative disc disease), lumbar    L3/L4   Diverticulosis    Dysrhythmia    SVT s/p ablation   Dysrhythmia    PAF   GERD (gastroesophageal reflux disease)    Hyperlipidemia    Low sperm motility    Myocardial infarction (HCC)    Pre-diabetes    Sleep apnea    uses cpap    SVT (supraventricular tachycardia) (HCC)    Past Surgical History:  Procedure Laterality Date   AMPUTATION Left 07/15/2018   Procedure: . REPAIR OF LEFT INDEX AND LONG FINGER BY FUSION /  REPAIR OF RING FINGER;  Surgeon: Sebastian Lenis, MD;  Location: [facility] OR;  Service: Orthopedics;  Laterality: Left;   BACK SURGERY     BLALOCK PROCEDURE     Ablation   CARDIAC CATHETERIZATION N/A 12/22/2015   Procedure: Left Heart Cath and Coronary Angiography;  Surgeon: Peter M Swaziland, MD;  Location: [facility] INVASIVE CV LAB;  Service: Cardiovascular;  Laterality: N/A;   CARDIAC ELECTROPHYSIOLOGY STUDY AND ABLATION     ELECTROPHYSIOLOGIC STUDY N/A 02/19/2016   Procedure: SVT Ablation;  Surgeon: Danelle LELON Birmingham, MD;  Location: [facility] INVASIVE CV LAB;  Service: Cardiovascular;  Laterality: N/A;   HAND SURGERY     septal reconstruction  2017   TOTAL KNEE ARTHROPLASTY Left 03/27/2024   Procedure: ARTHROPLASTY, KNEE, TOTAL, LEFT;  Surgeon:  Addie Cordella Hamilton, MD;  Location: [facility] OR;  Service: Orthopedics;  Laterality: Left;   TURBINATE REDUCTION Bilateral 07/2016   WISDOM TOOTH EXTRACTION     Patient Active Problem List   Diagnosis Date Noted   Arthritis of left knee 04/15/2024   S/P TKR (total knee replacement), left 03/29/2024   S/P total knee arthroplasty, left 03/27/2024   Constipation 02/14/2024   Metabolic dysfunction-associated steatotic liver disease (MASLD) 01/13/2024   Abnormal food appetite 11/17/2023   Insulin  resistance 11/17/2023   Class 2 severe obesity due to excess calories with serious comorbidity and body mass index (BMI) of 35.0 to 35.9 in adult [facility]) 11/03/2023   Prediabetes 11/03/2023   Degenerative disc disease, lumbar 12/09/2022   Morbid obesity (HCC) 10/26/2022   Bronchitis 01/29/2022   OSA on CPAP 02/19/2021   Bite, snake 01/28/2021   Neck pain 11/13/2020   Vertigo 11/13/2020   Hypertension 11/03/2020   Contusion of bone 10/23/2020   Degenerative arthritis of knee, bilateral 10/23/2020   Concussion with no loss of consciousness 10/23/2020   Paroxysmal atrial fibrillation (HCC) 05/22/2019   Unspecified atrial flutter (HCC) 05/22/2019   Pain in right foot 03/30/2018   Perennial and seasonal allergic rhinitis 11/09/2017   History  of food allergy 11/09/2017   Bronchitis, mucopurulent recurrent (HCC) 09/07/2017   SVT (supraventricular tachycardia) (HCC) 04/23/2015   Abdominal pain, epigastric 06/03/2013   Vitamin D  deficiency 04/09/2013   Esophageal reflux 04/09/2013   Hepatitis B carrier (HCC) 04/09/2013   Other and unspecified hyperlipidemia 04/09/2013   Sleep apnea 04/09/2013    PCP: Dallas Maxwell, PA-C  REFERRING PROVIDER: Cordella Glendia Hutchinson, MD  REFERRING DIAG:  Diagnosis  R53.1 (ICD-10-CM) - Weakness    THERAPY DIAG:  Difficulty in walking, not elsewhere classified  Muscle weakness (generalized)  Localized edema  Acute pain of left knee  Knee stiffness,  left  Rationale for Evaluation and Treatment: Rehabilitation  ONSET DATE: 03/27/2024  SUBJECTIVE:   SUBJECTIVE STATEMENT: I think you took me off the walker too soon, it's painful. Pain is rated a 10/10  PERTINENT HISTORY: L3-4 lumbar DDD; pre-diabetes; previous back surgery; cardiac history  PAIN:  NPRS scale: currently 10/10 (pt in no acute distress) Pain location: Lt Knee Pain description: Ache, sore, stiff Aggravating factors: Constantly aggravated Relieving factors: Some relief with medication, but not much  PRECAUTIONS: Back  RED FLAGS: None   WEIGHT BEARING RESTRICTIONS: No  FALLS:  Has patient fallen in last 6 months? No  LIVING ENVIRONMENT: Lives with: lives with their family, lives with their son, and lives with their daughter Lives in: House/apartment Stairs: Avoids stairs, walker and help for stairs now Has following equipment at home: Vannie - 2 wheeled  OCCUPATION: Retired  PLOF: Independent  PATIENT GOALS: Yard work, be more active   OBJECTIVE:  Note: Objective measures were completed at Evaluation unless otherwise noted.  DIAGNOSTIC FINDINGS: See chart  PATIENT SURVEYS:  PSFS: THE PATIENT SPECIFIC FUNCTIONAL SCALE  Place score of 0-10 (0 = unable to perform activity and 10 = able to perform activity at the same level as before injury or problem)  Activity Date: 04/04/2024    Sleep 0    2.  Sitting 2    3.  Moving around 2    4.  Showering 1    Total Score .125       Total Score = Sum of activity scores/number of activities  Minimally Detectable Change: 3 points (for single activity); 2 points (for average score)   COGNITION: 04/04/2024 Overall cognitive status: Within functional limits for tasks assessed     SENSATION: 04/04/2024 [facility]  EDEMA:  04/04/2024 Noted and not objectively assessed   LOWER EXTREMITY ROM:  Active ROM Left/Right 04/04/2024 Left  04/05/2024 Left 04/10/2024 04/13/24 04/17/24 Left 04/19/24  Knee flexion  62/145 82 with belt supine 100 Supine AROM heel slide P: 100 (guarding) Seated P: 112* Supine  A: 96* A: 114  Knee extension -5/0 -3 supine -10 in seated LAQ AROM A: -7 (seated LAQ) LAQ A: -5* Standing A: -3* P: 0   (Blank rows = not tested)  LOWER EXTREMITY STRENGTH:  MMT Right  04/04/2024 Left 04/04/2024  Hip flexion    Hip extension    Hip abduction    Hip adduction    Hip internal rotation    Hip external rotation    Knee flexion    Knee extension 4+ 2+  Ankle dorsiflexion    Ankle plantarflexion    Ankle inversion    Ankle eversion     (Blank rows = not tested)  GAIT: 04/04/2024 Distance walked: 50 feet Assistive device utilized: Environmental consultant - 2 wheeled Level of assistance: Modified independence Comments: Amil was assistive device free before surgery  TREATMENT 04/19/24 TherEx NuStep L6 x 8 min; working ROM Seated LAQ 3# on Lt 3x10; 3 sec hold Supine SAQ 2x10; 3 sec hold ROM measurements - see above for details  Manual Seated Lt knee flexion with overpressure at end ranges with distraction; Supine knee extension with overpressure and gr 2 ext mobs  Modalities Vaso x 10 min; high pressure in elevation to Lt knee; 34 deg  04/17/2024 TherAct (working ROM, strength to improve functional activities) Leg press BLEs 100# 10 reps 2 sets with PT cues on technique; LLE only 43# 10 reps. Seated LAQ with 3 sec hold on Lt; 4#; 3x10 cues for controlled eccentric motion. Seated hamstring curl green theraband Level 3 10 reps 3 sets with cues for controlled eccentric motion PT demo & verbal cues on weight shift on LLE upon arising 5 reps.  Pt return demo and PT noted less antalgic gait especially initial 5-10 steps.   Standing stepping over 6 hurdle with cane & table support during stepping over motion then static stance 5 sec alternating LEs for 5 reps ea LE. Standing TKE with green theraband level 3 10 reps 2 sets with cane support  TherEx SciFit bike seat  11 level 1 full revolutions with BLEs only 8 min.  Working on functional rhythmic flexion / extension.  Supine heel slide on ball with strap with flexion stretch 5 sec and knee ext / leg press 5 sec for 10 reps 2 sets.  Manual Therapy PROM with overpressure / distraction with tibial internal rotation for knee flexion seated.   Gait Training Amb with SPC with std tip with cues on upright posture and pelvic weight shift over LLE in stance.   Manual Vaso x 10 min; high pressure in elevation to Lt knee; 34 deg    04/13/24 TherAct (working ROM, strength to improve functional activities) NuStep L5 x 10 min Seated LAQ with 3 sec hold on Lt; 4#; 3x10  Leg Press bil 100# 3x10, then LLE only 37# 3x10  TherEx Lt knee hamstring stretch with overpressure at distal thigh 3x30 sec ROM measurements - see above for details  Gait Training Amb with SPC with quad tip minguard A progressing to supervision with mod cues for sequencing and step length throughout clinic between exercises  Manual Vaso x 10 min; high pressure in elevation to Lt knee; 34 deg    04/10/2024 Therex: Nustep lvl 5 for ROM of knee, seat 7 UE assist 8 mins  Incline gastroc stretch 30 sec x 3 bilateral  Seated Lt leg LAQ with end range pauses each direction with contralateral leg movement opposite 2.5 lbs 2 x 15 Supine heel slide 5 sec hold Lt x 1  Supine quad set Lt leg 5 sec hold x 10 Education on TKE stretching at home to tolerance for extension gains.   Theractivity (to improve stairs, walking, squatting, transfers) Leg press double leg 100 lbs slow lowering focus in available range  x 15 Leg press single leg Lt 31 lbs slow lowering focus in available range x 15    Manual: Seated Lt knee flexion c distraction/IR mobilization c movement.  Contralateral leg movement opposite.    Vaso: 10 mins Lt knee in elevation 34 degrees high  compression.        PATIENT EDUCATION:  Eval:  Education details: See  above Person educated: Patient and Child(ren) Education method: Explanation, Demonstration, Tactile cues, Verbal cues, and Handouts Education comprehension: verbalized understanding, returned demonstration, verbal cues required, tactile cues required, and  needs further education  HOME EXERCISE PROGRAM: Access Code: [email or web address] URL: [email or web address] Date: 04/04/2024 Prepared by: Lamar Ivory  Exercises - Seated Knee Flexion AAROM  - 5 x daily - 7 x weekly - 1 sets - 10 reps - 10 seconds hold - Seated Long Arc Quad  - 5 x daily - 7 x weekly - 1 sets - 10 reps - 3 seconds hold - Supine Quadricep Sets  - 5 x daily - 7 x weekly - 2 sets - 10 reps - 5 second hold  ASSESSMENT:  CLINICAL IMPRESSION: Pt with increased pain today thinking it is from transitioning to [facility] too quickly.  Advised that he is safe with SPC but if needed he can still use the RW due to pain and discussed ways to slolwy transition - pt verbalized understanding.  Session limited today due to increased pain but despite increased pain excellent improvement in ROM noted today.  Continues to have some quad weakness. Will continue to benefit from PT to maximize function.   OBJECTIVE IMPAIRMENTS: Abnormal gait, decreased activity tolerance, decreased endurance, decreased knowledge of condition, difficulty walking, decreased ROM, decreased strength, increased edema, impaired perceived functional ability, and pain.   ACTIVITY LIMITATIONS: bending, sitting, standing, squatting, sleeping, stairs, transfers, bed mobility, dressing, and locomotion level  PARTICIPATION LIMITATIONS: driving, community activity, and yard work  PERSONAL FACTORS: L3-4 lumbar DDD; pre-diabetes; previous back surgery; cardiac history are also affecting patient's functional outcome.    REHAB POTENTIAL: Good  CLINICAL DECISION MAKING: Stable/uncomplicated  EVALUATION COMPLEXITY: Low   GOALS: Goals reviewed with patient? Yes  SHORT  TERM GOALS: Target date: 05/16/2024 Richard Walker will be independent with his day 1 home exercise program Baseline: Started 04/04/2024 Goal status: Ongoing   04/17/2024  2.  Improve left knee active range of motion to 3 - 0 -100 degrees Baseline: 5 - 0 - 62 degrees Goal status: Ongoing   04/17/2024  3.  Improve left quadriceps strength as assessed by MMT and functional testing Baseline: 2+/5 at evaluation Goal status: Ongoing   04/17/2024  4.  Richard Walker will be independent with a single-point cane for ambulation outside the home Baseline: Wheeled walker full-time Goal status: Ongoing   04/17/2024   LONG TERM GOALS: Target date: 06/27/2024  Improve patient specific functional score to at least 5 Baseline: 0.125 Goal status: Ongoing   04/17/2024  2.  Richard Walker will report left knee pain consistently 0-3/10 on the numeric pain rating scale Baseline: 10/10 Goal status: Ongoing   04/17/2024  3.  Improve left knee AROM to 2 - 0 - 110 degrees or better Baseline: 5 - 0 - 62 degrees Goal status: Ongoing   04/17/2024  4.  Improve left quadriceps strength as assessed by MMT and Richard Walker's ability to ambulate without an assistive device 100% of the time Baseline: Wheeled walker full-time Goal status: Ongoing   04/17/2024  5.  Richard Walker will be independent with his long-term maintenance home exercise program at discharge Baseline: Started 04/04/2024 Goal status: Ongoing   04/17/2024    PLAN:  PT FREQUENCY: 2-3x/week  PT DURATION: 12 weeks  PLANNED INTERVENTIONS: 97750- Physical Performance Testing, 97110-Therapeutic exercises, 97530- Therapeutic activity, 97112- Neuromuscular re-education, 97535- Self Care, [id number]- Manual therapy, [phone]- Gait training, [phone]- Vasopneumatic device, Patient/Family education, Balance training, Stair training, Joint mobilization, and Cryotherapy  PLAN FOR NEXT SESSION:  monitor pain,  continue extension focus, active and passively.  Functional activities for balance & strength.  Vaso for edema.    NEXT MD VISIT: 05/14/2024  Corean JULIANNA Ku, PT, DPT 04/19/2024, 12:23 PM

## 2024-04-20 ENCOUNTER — Encounter: Payer: Self-pay | Admitting: Rehabilitative and Restorative Service Providers"

## 2024-04-20 ENCOUNTER — Ambulatory Visit (INDEPENDENT_AMBULATORY_CARE_PROVIDER_SITE_OTHER): Payer: Medicare (Managed Care) | Admitting: Rehabilitative and Restorative Service Providers"

## 2024-04-20 DIAGNOSIS — M6281 Muscle weakness (generalized): Secondary | ICD-10-CM

## 2024-04-20 DIAGNOSIS — M25662 Stiffness of left knee, not elsewhere classified: Secondary | ICD-10-CM

## 2024-04-20 DIAGNOSIS — R262 Difficulty in walking, not elsewhere classified: Secondary | ICD-10-CM

## 2024-04-20 DIAGNOSIS — M25562 Pain in left knee: Secondary | ICD-10-CM

## 2024-04-20 DIAGNOSIS — R600 Localized edema: Secondary | ICD-10-CM | POA: Diagnosis not present

## 2024-04-20 NOTE — Therapy (Signed)
 OUTPATIENT PHYSICAL THERAPY TREATMENT   Patient Name: Richard Walker MRN: [id number] DOB:1955/11/06, 68 y.o., male Today's Date: 04/20/2024  END OF SESSION:  PT End of Session - 04/20/24 1032     Visit Number 7    Number of Visits 24    Date for PT Re-Evaluation 06/27/24    Authorization Type [employer] 20% coinsurance    Authorization - Number of Visits 33    Progress Note Due on Visit 10    PT Start Time 1012    PT Stop Time 1053    PT Time Calculation (min) 41 min    Activity Tolerance Patient limited by pain;Patient limited by fatigue    Behavior During Therapy [facility] for tasks assessed/performed                Past Medical History:  Diagnosis Date   Arthritis    DDD (degenerative disc disease), lumbar    L3/L4   Diverticulosis    Dysrhythmia    SVT s/p ablation   Dysrhythmia    PAF   GERD (gastroesophageal reflux disease)    Hyperlipidemia    Low sperm motility    Myocardial infarction (HCC)    Pre-diabetes    Sleep apnea    uses cpap    SVT (supraventricular tachycardia) [facility])    Past Surgical History:  Procedure Laterality Date   AMPUTATION Left 07/15/2018   Procedure: . REPAIR OF LEFT INDEX AND LONG FINGER BY FUSION /  REPAIR OF RING FINGER;  Surgeon: Sebastian Lenis, MD;  Location: [facility] OR;  Service: Orthopedics;  Laterality: Left;   BACK SURGERY     BLALOCK PROCEDURE     Ablation   CARDIAC CATHETERIZATION N/A 12/22/2015   Procedure: Left Heart Cath and Coronary Angiography;  Surgeon: Peter M Swaziland, MD;  Location: [facility] INVASIVE CV LAB;  Service: Cardiovascular;  Laterality: N/A;   CARDIAC ELECTROPHYSIOLOGY STUDY AND ABLATION     ELECTROPHYSIOLOGIC STUDY N/A 02/19/2016   Procedure: SVT Ablation;  Surgeon: Danelle LELON Birmingham, MD;  Location: [facility] INVASIVE CV LAB;  Service: Cardiovascular;  Laterality: N/A;   HAND SURGERY     septal reconstruction  2017   TOTAL KNEE ARTHROPLASTY Left 03/27/2024   Procedure: ARTHROPLASTY, KNEE, TOTAL, LEFT;  Surgeon:  Addie Cordella Hamilton, MD;  Location: [facility] OR;  Service: Orthopedics;  Laterality: Left;   TURBINATE REDUCTION Bilateral 07/2016   WISDOM TOOTH EXTRACTION     Patient Active Problem List   Diagnosis Date Noted   Arthritis of left knee 04/15/2024   S/P TKR (total knee replacement), left 03/29/2024   S/P total knee arthroplasty, left 03/27/2024   Constipation 02/14/2024   Metabolic dysfunction-associated steatotic liver disease (MASLD) 01/13/2024   Abnormal food appetite 11/17/2023   Insulin  resistance 11/17/2023   Class 2 severe obesity due to excess calories with serious comorbidity and body mass index (BMI) of 35.0 to 35.9 in adult [facility]) 11/03/2023   Prediabetes 11/03/2023   Degenerative disc disease, lumbar 12/09/2022   Morbid obesity (HCC) 10/26/2022   Bronchitis 01/29/2022   OSA on CPAP 02/19/2021   Bite, snake 01/28/2021   Neck pain 11/13/2020   Vertigo 11/13/2020   Hypertension 11/03/2020   Contusion of bone 10/23/2020   Degenerative arthritis of knee, bilateral 10/23/2020   Concussion with no loss of consciousness 10/23/2020   Paroxysmal atrial fibrillation (HCC) 05/22/2019   Unspecified atrial flutter (HCC) 05/22/2019   Pain in right foot 03/30/2018   Perennial and seasonal allergic rhinitis 11/09/2017  History of food allergy 11/09/2017   Bronchitis, mucopurulent recurrent (HCC) 09/07/2017   SVT (supraventricular tachycardia) (HCC) 04/23/2015   Abdominal pain, epigastric 06/03/2013   Vitamin D  deficiency 04/09/2013   Esophageal reflux 04/09/2013   Hepatitis B carrier (HCC) 04/09/2013   Other and unspecified hyperlipidemia 04/09/2013   Sleep apnea 04/09/2013    PCP: Dallas Maxwell, PA-C  REFERRING PROVIDER: Cordella Glendia Hutchinson, MD  REFERRING DIAG:  Diagnosis  R53.1 (ICD-10-CM) - Weakness    THERAPY DIAG:  Difficulty in walking, not elsewhere classified  Muscle weakness (generalized)  Localized edema  Acute pain of left knee  Knee stiffness,  left  Rationale for Evaluation and Treatment: Rehabilitation  ONSET DATE: 03/27/2024  SUBJECTIVE:   SUBJECTIVE STATEMENT: Pt indicated having a burning feeling in knee most of the time. Reported having trouble sleeping last night.  PERTINENT HISTORY: L3-4 lumbar DDD; pre-diabetes; previous back surgery; cardiac history  PAIN:  NPRS scale: almost 9/10 (no visible distress) Pain location: Lt Knee Pain description: Ache, sore, stiff Aggravating factors: Constantly aggravated Relieving factors: Some relief with medication, but not much  PRECAUTIONS: Back  RED FLAGS: None   WEIGHT BEARING RESTRICTIONS: No  FALLS:  Has patient fallen in last 6 months? No  LIVING ENVIRONMENT: Lives with: lives with their family, lives with their son, and lives with their daughter Lives in: House/apartment Stairs: Avoids stairs, walker and help for stairs now Has following equipment at home: Vannie - 2 wheeled  OCCUPATION: Retired  PLOF: Independent  PATIENT GOALS: Yard work, be more active   OBJECTIVE:  Note: Objective measures were completed at Evaluation unless otherwise noted.  DIAGNOSTIC FINDINGS: See chart  PATIENT SURVEYS:  PSFS: THE PATIENT SPECIFIC FUNCTIONAL SCALE  Place score of 0-10 (0 = unable to perform activity and 10 = able to perform activity at the same level as before injury or problem)  Activity Date: 04/04/2024    Sleep 0    2.  Sitting 2    3.  Moving around 2    4.  Showering 1    Total Score 1.25       Total Score = Sum of activity scores/number of activities  Minimally Detectable Change: 3 points (for single activity); 2 points (for average score)   COGNITION: 04/04/2024 Overall cognitive status: Within functional limits for tasks assessed     SENSATION: 04/04/2024 [facility]  EDEMA:  04/04/2024 Noted and not objectively assessed   LOWER EXTREMITY ROM:  Active ROM Left/Right 04/04/2024 Left  04/05/2024 Left 04/10/2024 04/13/24 04/17/24  Left 04/19/24  Knee flexion 62/145 82 with belt supine 100 Supine AROM heel slide P: 100 (guarding) Seated P: 112* Supine  A: 96* A: 114  Knee extension -5/0 -3 supine -10 in seated LAQ AROM A: -7 (seated LAQ) LAQ A: -5* Standing A: -3* P: 0   (Blank rows = not tested)  LOWER EXTREMITY STRENGTH:  MMT Right  04/04/2024 Left 04/04/2024  Hip flexion    Hip extension    Hip abduction    Hip adduction    Hip internal rotation    Hip external rotation    Knee flexion    Knee extension 4+ 2+  Ankle dorsiflexion    Ankle plantarflexion    Ankle inversion    Ankle eversion     (Blank rows = not tested)  GAIT: 04/04/2024 Distance walked: 50 feet Assistive device utilized: Environmental consultant - 2 wheeled Level of assistance: Modified independence Comments: Racer was assistive device free before surgery  TREATMENT       DATE:  04/20/2024 Therex:   Recumbent bike seat 7 partial revolutions to full revolutions 8 mins total  Incline gastroc stretch 30 sec x 3 bilateral  Seated quad set with SLR Lt 2 x 10  Seated Lt leg LAQ 2 x 15 4 lbs with pauses in each end range.    TherActivity (to improve stairs, transfers, ambulation) Leg press double leg 75 lbs x 15 with slow lowering focus Leg press single leg 31 lbs 2 x 15 Lt leg with slow lowering focus    Vasopneumatic Device Vaso x 10 min; high pressure in elevation to Lt knee; 34 deg     TREATMENT       DATE:  04/19/24 TherEx NuStep L6 x 8 min; working ROM Seated LAQ 3# on Lt 3x10; 3 sec hold Supine SAQ 2x10; 3 sec hold ROM measurements - see above for details  Manual Seated Lt knee flexion with overpressure at end ranges with distraction; Supine knee extension with overpressure and gr 2 ext mobs  Modalities Vaso x 10 min; high pressure in elevation to Lt knee; 34 deg  TREATMENT       DATE:  04/17/2024 TherAct (working ROM, strength to improve functional activities) Leg press BLEs 100# 10 reps 2 sets with PT cues on  technique; LLE only 43# 10 reps. Seated LAQ with 3 sec hold on Lt; 4#; 3x10 cues for controlled eccentric motion. Seated hamstring curl green theraband Level 3 10 reps 3 sets with cues for controlled eccentric motion PT demo & verbal cues on weight shift on LLE upon arising 5 reps.  Pt return demo and PT noted less antalgic gait especially initial 5-10 steps.   Standing stepping over 6 hurdle with cane & table support during stepping over motion then static stance 5 sec alternating LEs for 5 reps ea LE. Standing TKE with green theraband level 3 10 reps 2 sets with cane support  TherEx SciFit bike seat 11 level 1 full revolutions with BLEs only 8 min.  Working on functional rhythmic flexion / extension.  Supine heel slide on ball with strap with flexion stretch 5 sec and knee ext / leg press 5 sec for 10 reps 2 sets.  Manual Therapy PROM with overpressure / distraction with tibial internal rotation for knee flexion seated.   Gait Training Amb with SPC with std tip with cues on upright posture and pelvic weight shift over LLE in stance.   Manual Vaso x 10 min; high pressure in elevation to Lt knee; 34 deg    TREATMENT       DATE:  04/13/24 TherAct (working ROM, strength to improve functional activities) NuStep L5 x 10 min Seated LAQ with 3 sec hold on Lt; 4#; 3x10  Leg Press bil 100# 3x10, then LLE only 37# 3x10  TherEx Lt knee hamstring stretch with overpressure at distal thigh 3x30 sec ROM measurements - see above for details  Gait Training Amb with SPC with quad tip minguard A progressing to supervision with mod cues for sequencing and step length throughout clinic between exercises  Manual Vaso x 10 min; high pressure in elevation to Lt knee; 34 deg     PATIENT EDUCATION:  Eval:  Education details: See above Person educated: Patient and Child(ren) Education method: Explanation, Demonstration, Tactile cues, Verbal cues, and Handouts Education comprehension: verbalized  understanding, returned demonstration, verbal cues required, tactile cues required, and needs further education  HOME EXERCISE PROGRAM: Access Code: [email or web address]  URL: [email or web address] Date: 04/04/2024 Prepared by: Lamar Ivory  Exercises - Seated Knee Flexion AAROM  - 5 x daily - 7 x weekly - 1 sets - 10 reps - 10 seconds hold - Seated Long Arc Quad  - 5 x daily - 7 x weekly - 1 sets - 10 reps - 3 seconds hold - Supine Quadricep Sets  - 5 x daily - 7 x weekly - 2 sets - 10 reps - 5 second hold  ASSESSMENT:  CLINICAL IMPRESSION: Continued obvious antalgic gait noted with SPC.  Higher pain levels indicated consistently which has impacted as well as overall fatigue reporting. Symptoms are noted in mobility despite gains in mobility as noted previous.  Discussed cross friction massage options once incision healed . Continued skilled PT services with gains in extension, quad strength and balance to help progress towards goals.    Pt requested cessation of activity in visit due to overall fatigue and headache complaints.  Performed vaso with resting to recover some in clinic prior to leaving.     OBJECTIVE IMPAIRMENTS: Abnormal gait, decreased activity tolerance, decreased endurance, decreased knowledge of condition, difficulty walking, decreased ROM, decreased strength, increased edema, impaired perceived functional ability, and pain.   ACTIVITY LIMITATIONS: bending, sitting, standing, squatting, sleeping, stairs, transfers, bed mobility, dressing, and locomotion level  PARTICIPATION LIMITATIONS: driving, community activity, and yard work  PERSONAL FACTORS: L3-4 lumbar DDD; pre-diabetes; previous back surgery; cardiac history are also affecting patient's functional outcome.    REHAB POTENTIAL: Good  CLINICAL DECISION MAKING: Stable/uncomplicated  EVALUATION COMPLEXITY: Low   GOALS: Goals reviewed with patient? Yes  SHORT TERM GOALS: Target date: 05/16/2024 Richard Walker will  be independent with his day 1 home exercise program Baseline: Started 04/04/2024 Goal status: Ongoing   04/17/2024  2.  Improve left knee active range of motion to 3 - 0 -100 degrees Baseline: 5 - 0 - 62 degrees Goal status: Ongoing   04/17/2024  3.  Improve left quadriceps strength as assessed by MMT and functional testing Baseline: 2+/5 at evaluation Goal status: Ongoing   04/17/2024  4.  Richard Walker will be independent with a single-point cane for ambulation outside the home Baseline: Wheeled walker full-time Goal status: Ongoing   04/17/2024   LONG TERM GOALS: Target date: 06/27/2024  Improve patient specific functional score to at least 5 Baseline: 0.125 Goal status: Ongoing   04/17/2024  2.  Richard Walker will report left knee pain consistently 0-3/10 on the numeric pain rating scale Baseline: 10/10 Goal status: Ongoing   04/17/2024  3.  Improve left knee AROM to 2 - 0 - 110 degrees or better Baseline: 5 - 0 - 62 degrees Goal status: Ongoing   04/17/2024  4.  Improve left quadriceps strength as assessed by MMT and Richard Walker's ability to ambulate without an assistive device 100% of the time Baseline: Wheeled walker full-time Goal status: Ongoing   04/17/2024  5.  Richard Walker will be independent with his long-term maintenance home exercise program at discharge Baseline: Started 04/04/2024 Goal status: Ongoing   04/17/2024    PLAN:  PT FREQUENCY: 2-3x/week  PT DURATION: 12 weeks  PLANNED INTERVENTIONS: 97750- Physical Performance Testing, 97110-Therapeutic exercises, 97530- Therapeutic activity, 97112- Neuromuscular re-education, 97535- Self Care, [id number]- Manual therapy, [phone]- Gait training, [phone]- Vasopneumatic device, Patient/Family education, Balance training, Stair training, Joint mobilization, and Cryotherapy  PLAN FOR NEXT SESSION: Extension gains, strength/balance improvements.  Manage progressions with adjustments due to pain symptoms.   NEXT MD VISIT: 05/14/2024   Ozell Silvan, PT,  DPT, OCS,  ATC 04/20/24  10:47 AM

## 2024-04-25 ENCOUNTER — Encounter: Payer: Self-pay | Admitting: Rehabilitative and Restorative Service Providers"

## 2024-04-25 ENCOUNTER — Ambulatory Visit (INDEPENDENT_AMBULATORY_CARE_PROVIDER_SITE_OTHER): Payer: Medicare (Managed Care) | Admitting: Rehabilitative and Restorative Service Providers"

## 2024-04-25 DIAGNOSIS — R600 Localized edema: Secondary | ICD-10-CM | POA: Diagnosis not present

## 2024-04-25 DIAGNOSIS — R262 Difficulty in walking, not elsewhere classified: Secondary | ICD-10-CM

## 2024-04-25 DIAGNOSIS — M6281 Muscle weakness (generalized): Secondary | ICD-10-CM | POA: Diagnosis not present

## 2024-04-25 DIAGNOSIS — M25562 Pain in left knee: Secondary | ICD-10-CM

## 2024-04-25 DIAGNOSIS — M25662 Stiffness of left knee, not elsewhere classified: Secondary | ICD-10-CM

## 2024-04-25 NOTE — Therapy (Signed)
 OUTPATIENT PHYSICAL THERAPY TREATMENT   Patient Name: Richard Walker MRN: [id number] DOB:06-Mar-1956, 68 y.o., male Today's Date: 04/25/2024  END OF SESSION:  PT End of Session - 04/25/24 1351     Visit Number 8    Number of Visits 24    Date for PT Re-Evaluation 06/27/24    Authorization Type [employer] 20% coinsurance    Authorization - Number of Visits 33    Progress Note Due on Visit 10    PT Start Time 1349    PT Stop Time 1437    PT Time Calculation (min) 48 min    Activity Tolerance Patient tolerated treatment well;No increased pain    Behavior During Therapy WFL for tasks assessed/performed                 Past Medical History:  Diagnosis Date   Arthritis    DDD (degenerative disc disease), lumbar    L3/L4   Diverticulosis    Dysrhythmia    SVT s/p ablation   Dysrhythmia    PAF   GERD (gastroesophageal reflux disease)    Hyperlipidemia    Low sperm motility    Myocardial infarction (HCC)    Pre-diabetes    Sleep apnea    uses cpap    SVT (supraventricular tachycardia) [facility])    Past Surgical History:  Procedure Laterality Date   AMPUTATION Left 07/15/2018   Procedure: . REPAIR OF LEFT INDEX AND LONG FINGER BY FUSION /  REPAIR OF RING FINGER;  Surgeon: Sebastian Lenis, MD;  Location: [facility] OR;  Service: Orthopedics;  Laterality: Left;   BACK SURGERY     BLALOCK PROCEDURE     Ablation   CARDIAC CATHETERIZATION N/A 12/22/2015   Procedure: Left Heart Cath and Coronary Angiography;  Surgeon: Peter M Swaziland, MD;  Location: [facility] INVASIVE CV LAB;  Service: Cardiovascular;  Laterality: N/A;   CARDIAC ELECTROPHYSIOLOGY STUDY AND ABLATION     ELECTROPHYSIOLOGIC STUDY N/A 02/19/2016   Procedure: SVT Ablation;  Surgeon: Danelle LELON Birmingham, MD;  Location: [facility] INVAS[facility] CV LAB;  Service: Cardiovascular;  Laterality: N/A;   HAND SURGERY     septal reconstruction  2017   TOTAL KNEE ARTHROPLASTY Left 03/27/2024   Procedure: ARTHROPLASTY, KNEE, TOTAL, LEFT;  Surgeon:  Addie Cordella Hamilton, MD;  Location: [facility] OR;  Service: Orthopedics;  Laterality: Left;   TURBINATE REDUCTION Bilateral 07/2016   WISDOM TOOTH EXTRACTION     Patient Active Problem List   Diagnosis Date Noted   Arthritis of left knee 04/15/2024   S/P TKR (total knee replacement), left 03/29/2024   S/P total knee arthroplasty, left 03/27/2024   Constipation 02/14/2024   Metabolic dysfunction-associated steatotic liver disease (MASLD) 01/13/2024   Abnormal food appetite 11/17/2023   Insulin  resistance 11/17/2023   Class 2 severe obesity due to excess calories with serious comorbidity and body mass index (BMI) of 35.0 to 35.9 in adult [facility]) 11/03/2023   Prediabetes 11/03/2023   Degenerative disc disease, lumbar 12/09/2022   Morbid obesity (HCC) 10/26/2022   Bronchitis 01/29/2022   OSA on CPAP 02/19/2021   Bite, snake 01/28/2021   Neck pain 11/13/2020   Vertigo 11/13/2020   Hypertension 11/03/2020   Contusion of bone 10/23/2020   Degenerative arthritis of knee, bilateral 10/23/2020   Concussion with no loss of consciousness 10/23/2020   Paroxysmal atrial fibrillation (HCC) 05/22/2019   Unspecified atrial flutter (HCC) 05/22/2019   Pain in right foot 03/30/2018   Perennial and seasonal allergic rhinitis 11/09/2017  History of food allergy 11/09/2017   Bronchitis, mucopurulent recurrent (HCC) 09/07/2017   SVT (supraventricular tachycardia) (HCC) 04/23/2015   Abdominal pain, epigastric 06/03/2013   Vitamin D  deficiency 04/09/2013   Esophageal reflux 04/09/2013   Hepatitis B carrier (HCC) 04/09/2013   Other and unspecified hyperlipidemia 04/09/2013   Sleep apnea 04/09/2013    PCP: Dallas Maxwell, PA-C  REFERRING PROVIDER: Cordella Glendia Hutchinson, MD  REFERRING DIAG:  Diagnosis  R53.1 (ICD-10-CM) - Weakness    THERAPY DIAG:  Difficulty in walking, not elsewhere classified  Muscle weakness (generalized)  Localized edema  Acute pain of left knee  Knee stiffness,  left  Rationale for Evaluation and Treatment: Rehabilitation  ONSET DATE: 03/27/2024  SUBJECTIVE:   SUBJECTIVE STATEMENT: Delta is very happy with his early progress with his supervised physical therapy.  He is still having a hard time sleeping most nights and he is very motivated to get his other knee replaced this year as well.  PERTINENT HISTORY: L3-4 lumbar DDD; pre-diabetes; previous back surgery; cardiac history  PAIN:  NPRS scale: 0-5/10 this week Pain location: Lt Knee Pain description: Ache, sore, stiff Aggravating factors: Sleeping  Relieving factors: Pain medications and ice  PRECAUTIONS: Back  RED FLAGS: None   WEIGHT BEARING RESTRICTIONS: No  FALLS:  Has patient fallen in last 6 months? No  LIVING ENVIRONMENT: Lives with: lives with their family, lives with their son, and lives with their daughter Lives in: House/apartment Stairs: Avoids stairs, walker and help for stairs now Has following equipment at home: Vannie - 2 wheeled  OCCUPATION: Retired  PLOF: Independent  PATIENT GOALS: Yard work, be more active   OBJECTIVE:  Note: Objective measures were completed at Evaluation unless otherwise noted.  DIAGNOSTIC FINDINGS: See chart  PATIENT SURVEYS:  PSFS: THE PATIENT SPECIFIC FUNCTIONAL SCALE  Place score of 0-10 (0 = unable to perform activity and 10 = able to perform activity at the same level as before injury or problem)  Activity Date: 04/04/2024    Sleep 0    2.  Sitting 2    3.  Moving around 2    4.  Showering 1    Total Score 1.25       Total Score = Sum of activity scores/number of activities  Minimally Detectable Change: 3 points (for single activity); 2 points (for average score)   COGNITION: 04/04/2024 Overall cognitive status: Within functional limits for tasks assessed     SENSATION: 04/04/2024 [facility]  EDEMA:  04/04/2024 Noted and not objectively assessed   LOWER EXTREMITY ROM:  Active ROM Left/Right 04/04/2024 Left   04/05/2024 Left 04/10/2024 04/13/24 04/17/24 Left 04/19/24  Knee flexion 62/145 82 with belt supine 100 Supine AROM heel slide P: 100 (guarding) Seated P: 112* Supine  A: 96* A: 114  Knee extension -5/0 -3 supine -10 in seated LAQ AROM A: -7 (seated LAQ) LAQ A: -5* Standing A: -3* P: 0   (Blank rows = not tested)  LOWER EXTREMITY STRENGTH:  MMT Right  04/04/2024 Left 04/04/2024  Hip flexion    Hip extension    Hip abduction    Hip adduction    Hip internal rotation    Hip external rotation    Knee flexion    Knee extension 4+ 2+  Ankle dorsiflexion    Ankle plantarflexion    Ankle inversion    Ankle eversion     (Blank rows = not tested)  GAIT: 04/04/2024 Distance walked: 50 feet Assistive device utilized: Environmental consultant - 2  wheeled Level of assistance: Modified independence Comments: Emoni was assistive device free before surgery                    TREATMENT       DATE:   04/25/2024 Recumbent bike Seat 7 for 7 minutes Resistance Level 4-5 Seated straight leg raises 3 sets of 5 with 1#  Functional Activities: Double Leg Press 125# 12 x full extension and slow eccentrics into flexion Single Leg Press 50# 10 x left side only full extension and slow eccentrics into flexion  Neuromuscular re-education: Tandem balance 6 x 20 seconds Single leg stance 8 x 10 seconds  Vaso Left knee Medium pressure 34 degrees 10 minutes   04/20/2024 Therex:   Recumbent bike seat 7 partial revolutions to full revolutions 8 mins total  Incline gastroc stretch 30 sec x 3 bilateral  Seated quad set with SLR Lt 2 x 10  Seated Lt leg LAQ 2 x 15 4 lbs with pauses in each end range.    TherActivity (to improve stairs, transfers, ambulation) Leg press double leg 75 lbs x 15 with slow lowering focus Leg press single leg 31 lbs 2 x 15 Lt leg with slow lowering focus    Vasopneumatic Device Vaso x 10 min; high pressure in elevation to Lt knee; 34 deg   TREATMENT       DATE:   04/19/24 TherEx NuStep L6 x 8 min; working ROM Seated LAQ 3# on Lt 3x10; 3 sec hold Supine SAQ 2x10; 3 sec hold ROM measurements - see above for details  Manual Seated Lt knee flexion with overpressure at end ranges with distraction; Supine knee extension with overpressure and gr 2 ext mobs  Modalities Vaso x 10 min; high pressure in elevation to Lt knee; 34 deg   PATIENT EDUCATION:  Eval:  Education details: See above Person educated: Patient and Child(ren) Education method: Explanation, Demonstration, Tactile cues, Verbal cues, and Handouts Education comprehension: verbalized understanding, returned demonstration, verbal cues required, tactile cues required, and needs further education  HOME EXERCISE PROGRAM: Access Code: [email or web address] URL: [email or web address] Date: 04/25/2024 Prepared by: Lamar Ivory  Exercises - Seated Knee Flexion AAROM  - 5 x daily - 7 x weekly - 1 sets - 10 reps - 10 seconds hold - Seated Long Arc Quad  - 5 x daily - 7 x weekly - 1 sets - 10 reps - 3 seconds hold - Supine Quadricep Sets  - 5 x daily - 7 x weekly - 2 sets - 10 reps - 5 second hold - Small Range Straight Leg Raise  - 2 x daily - 7 x weekly - 3-5 sets - 5 reps - 3 seconds hold - Tandem Stance  - 1 x daily - 7 x weekly - 1 sets - 10 reps - 20 second hold - Single Leg Stance  - 1 x daily - 7 x weekly - 1 sets - 10 reps - 10 seconds hold  ASSESSMENT:  CLINICAL IMPRESSION: Keivon wanted to focus on strength and activities to make him more comfortable off the cane today.  He is highly motivated to get his right knee replaced this calendar year and objective assessments on previous visits show he is making excellent progress with his active range of motion.  Taavi will continue to benefit from quadriceps strengthening, active range of motion, edema control, functional and balance activities to meet all long-term goals established at evaluation.  OBJECTIVE IMPAIRMENTS: Abnormal gait,  decreased activity tolerance,  decreased endurance, decreased knowledge of condition, difficulty walking, decreased ROM, decreased strength, increased edema, impaired perceived functional ability, and pain.   ACTIVITY LIMITATIONS: bending, sitting, standing, squatting, sleeping, stairs, transfers, bed mobility, dressing, and locomotion level  PARTICIPATION LIMITATIONS: driving, community activity, and yard work  PERSONAL FACTORS: L3-4 lumbar DDD; pre-diabetes; previous back surgery; cardiac history are also affecting patient's functional outcome.    REHAB POTENTIAL: Good  CLINICAL DECISION MAKING: Stable/uncomplicated  EVALUATION COMPLEXITY: Low   GOALS: Goals reviewed with patient? Yes  SHORT TERM GOALS: Target date: 05/16/2024 Samul will be independent with his day 1 home exercise program Baseline: Started 04/04/2024 Goal status: Met 04/25/2024  2.  Improve left knee active range of motion to 3 - 0 -100 degrees Baseline: 5 - 0 - 62 degrees Goal status: Met 04/25/2024  3.  Improve left quadriceps strength as assessed by MMT and functional testing Baseline: 2+/5 at evaluation Goal status: Ongoing   04/17/2024  4.  Kemar will be independent with a single-point cane for ambulation outside the home Baseline: Wheeled walker full-time Goal status: Met 04/25/2024   LONG TERM GOALS: Target date: 06/27/2024  Improve patient specific functional score to at least 5 Baseline: 0.125 Goal status: Ongoing   04/17/2024  2.  Ascencion will report left knee pain consistently 0-3/10 on the numeric pain rating scale Baseline: 10/10 Goal status: Ongoing   04/17/2024  3.  Improve left knee AROM to 2 - 0 - 110 degrees or better Baseline: 5 - 0 - 62 degrees Goal status: Ongoing   04/17/2024  4.  Improve left quadriceps strength as assessed by MMT and Kyal's ability to ambulate without an assistive device 100% of the time Baseline: Wheeled walker full-time Goal status: Ongoing   04/17/2024  5.  Jeffrie will be  independent with his long-term maintenance home exercise program at discharge Baseline: Started 04/04/2024 Goal status: Ongoing   04/17/2024    PLAN:  PT FREQUENCY: 2-3x/week  PT DURATION: 12 weeks  PLANNED INTERVENTIONS: 97750- Physical Performance Testing, 97110-Therapeutic exercises, 97530- Therapeutic activity, 97112- Neuromuscular re-education, 97535- Self Care, 02859- Manual therapy, [phone]- Gait training, [phone]- Vasopneumatic device, Patient/Family education, Balance training, Stair training, Joint mobilization, and Cryotherapy  PLAN FOR NEXT SESSION: AROM, (quadriceps) strength/balance improvements.    NEXT MD VISIT: 05/14/2024   Myer LELON Ivory, PT, MPT 04/25/24  4:33 PM

## 2024-04-26 ENCOUNTER — Encounter: Payer: Self-pay | Admitting: Rehabilitative and Restorative Service Providers"

## 2024-04-26 ENCOUNTER — Encounter: Payer: Self-pay | Admitting: Internal Medicine

## 2024-04-26 ENCOUNTER — Other Ambulatory Visit: Payer: Self-pay

## 2024-04-26 ENCOUNTER — Ambulatory Visit: Payer: Medicare (Managed Care) | Admitting: Rehabilitative and Restorative Service Providers"

## 2024-04-26 ENCOUNTER — Ambulatory Visit: Payer: Medicare (Managed Care) | Attending: Internal Medicine | Admitting: Internal Medicine

## 2024-04-26 VITALS — BP 110/71 | HR 62 | Ht 68.0 in

## 2024-04-26 DIAGNOSIS — M25562 Pain in left knee: Secondary | ICD-10-CM | POA: Diagnosis not present

## 2024-04-26 DIAGNOSIS — I480 Paroxysmal atrial fibrillation: Secondary | ICD-10-CM

## 2024-04-26 DIAGNOSIS — R262 Difficulty in walking, not elsewhere classified: Secondary | ICD-10-CM | POA: Diagnosis not present

## 2024-04-26 DIAGNOSIS — R0600 Dyspnea, unspecified: Secondary | ICD-10-CM | POA: Diagnosis not present

## 2024-04-26 DIAGNOSIS — I471 Supraventricular tachycardia, unspecified: Secondary | ICD-10-CM | POA: Diagnosis not present

## 2024-04-26 DIAGNOSIS — M6281 Muscle weakness (generalized): Secondary | ICD-10-CM

## 2024-04-26 DIAGNOSIS — R600 Localized edema: Secondary | ICD-10-CM

## 2024-04-26 DIAGNOSIS — M25662 Stiffness of left knee, not elsewhere classified: Secondary | ICD-10-CM

## 2024-04-26 NOTE — Progress Notes (Signed)
 HPI [name] returns today for followup. He is a pleasant 68 yo man with SVT s/p ablation who developed palpitations and was found to have PAF. He has been maintained on fairly high dose flecainide  which at times he has not taken. He wore a cardiac monitor several months ago and was found to have occaisional PAC's and PVC's but no sustained arrhythmias. His dyspnea is improved. He has started seeing the weight loss clinic and has lost over 30 lbs. He has started Zepbound  as he has sleep apnea. He has had a PET scan which showed LAD ischemia. He has dyspnea with exertion which I think is his anginal equivalent. Allergies  Allergen Reactions   Dust Mite Extract    Pork-Derived Products Swelling   Statins     Muscle aches   Tree Extract Other (See Comments)    Intolerance Grass     Current Outpatient Medications  Medication Sig Dispense Refill   acetaminophen  (TYLENOL ) 325 MG tablet Take 1-2 tablets (325-650 mg total) by mouth every 6 (six) hours as needed for mild pain (pain score 1-3) or fever (or temp > 100.5). 60 tablet 0   albuterol  (PROAIR  HFA) 108 (90 Base) MCG/ACT inhaler Inhale 1-2 puffs into the lungs every 6 (six) hours as needed for wheezing or shortness of breath. 24 g 3   azelastine  (ASTELIN ) 0.1 % nasal spray 1-2 sprays per nostril 2 times daily as needed. 90 mL 3   Budeson-Glycopyrrol-Formoterol  (BREZTRI  AEROSPHERE) 160-9-4.8 MCG/ACT AERO Inhale 2 puffs into the lungs in the morning and at bedtime. 10.7 g 11   docusate sodium  (COLACE) 100 MG capsule Take 1 capsule (100 mg total) by mouth 2 (two) times daily. 30 capsule 0   ezetimibe  (ZETIA ) 10 MG tablet Take 1 tablet (10 mg total) by mouth daily. 90 tablet 3   fenofibrate  (TRICOR ) 145 MG tablet Take 1 tablet (145 mg total) by mouth daily. 90 tablet 3   flecainide  (TAMBOCOR ) 150 MG tablet Take 150 mg by mouth 2 (two) times daily.     gabapentin  (NEURONTIN ) 100 MG capsule Take 1 capsule (100 mg total) by mouth 2 (two)  times daily. 60 capsule 0   gabapentin  (NEURONTIN ) 100 MG capsule Take 1 capsule (100 mg total) by mouth 2 (two) times daily. 42 capsule 0   ibuprofen  (ADVIL ) 600 MG tablet 1 po bid prn 30 tablet 0   losartan  (COZAAR ) 25 MG tablet Take 1 tablet (25 mg total) by mouth daily. 90 tablet 3   Magnesium  250 MG TABS Take 250 mg by mouth every 7 (seven) days.     metFORMIN  (GLUCOPHAGE ) 500 MG tablet TAKE 1 TABLET(500 MG) BY MOUTH TWICE DAILY WITH A MEAL 180 tablet 3   methocarbamol  (ROBAXIN ) 500 MG tablet Take 1 tablet (500 mg total) by mouth every 8 (eight) hours as needed for muscle spasms. 30 tablet 0   metoprolol  tartrate (LOPRESSOR ) 25 MG tablet Take one tablet by mouth twice a day.  May take one additional tablet by mouth as needed for breakthrough palpitations. 270 tablet 3   oxyCODONE  (OXY IR/ROXICODONE ) 5 MG immediate release tablet Take 1 tablet (5 mg total) by mouth every 4 (four) hours as needed for moderate pain (pain score 4-6) (pain score 4-6). 30 tablet 0   Oxycodone  HCl 10 MG TABS 1 po q 4hrs prn pain 45 tablet 0   pantoprazole  (PROTONIX ) 40 MG tablet Take 1 tablet (40 mg total) by mouth daily. 30 tablet 11  PRESCRIPTION MEDICATION Inhale into the lungs at bedtime. CPAP     rivaroxaban  (XARELTO ) 20 MG TABS tablet Take 1 tablet (20 mg total) by mouth daily with supper. 90 tablet 3   sildenafil  (VIAGRA ) 100 MG tablet Take 1 tablet (100 mg total) by mouth daily as needed for erectile dysfunction. 30 tablet 3   Spacer/Aero-Holding Chambers (AEROCHAMBER MV) inhaler Use as instructed 1 each 0   tirzepatide  (ZEPBOUND ) 7.5 MG/0.5ML Pen Inject 7.5 mg into the skin once a week. 2 mL 0   Vitamin D , Ergocalciferol , (DRISDOL ) 1.25 MG (50000 UNIT) CAPS capsule Take 1 capsule (50,000 Units total) by mouth every 7 (seven) days. 8 capsule 3   No current facility-administered medications for this visit.     Past Medical History:  Diagnosis Date   Arthritis    DDD (degenerative disc disease), lumbar     L3/L4   Diverticulosis    Dysrhythmia    SVT s/p ablation   Dysrhythmia    PAF   GERD (gastroesophageal reflux disease)    Hyperlipidemia    Low sperm motility    Myocardial infarction (HCC)    Pre-diabetes    Sleep apnea    uses cpap    SVT (supraventricular tachycardia) (HCC)     ROS:   All systems reviewed and negative except as noted in the HPI.   Past Surgical History:  Procedure Laterality Date   AMPUTATION Left 07/15/2018   Procedure: . REPAIR OF LEFT INDEX AND LONG FINGER BY FUSION /  REPAIR OF RING FINGER;  Surgeon: Sebastian Lenis, MD;  Location: [facility] OR;  Service: Orthopedics;  Laterality: Left;   BACK SURGERY     BLALOCK PROCEDURE     Ablation   CARDIAC CATHETERIZATION N/A 12/22/2015   Procedure: Left Heart Cath and Coronary Angiography;  Surgeon: Peter M Swaziland, MD;  Location: [facility] INVASIVE CV LAB;  Service: Cardiovascular;  Laterality: N/A;   CARDIAC ELECTROPHYSIOLOGY STUDY AND ABLATION     ELECTROPHYSIOLOGIC STUDY N/A 02/19/2016   Procedure: SVT Ablation;  Surgeon: Richard LELON Birmingham, MD;  Location: [facility] INVASIVE CV LAB;  Service: Cardiovascular;  Laterality: N/A;   HAND SURGERY     septal reconstruction  2017   TOTAL KNEE ARTHROPLASTY Left 03/27/2024   Procedure: ARTHROPLASTY, KNEE, TOTAL, LEFT;  Surgeon: Addie Cordella Hamilton, MD;  Location: [facility] OR;  Service: Orthopedics;  Laterality: Left;   TURBINATE REDUCTION Bilateral 07/2016   WISDOM TOOTH EXTRACTION       Family History  Problem Relation Age of Onset   Stroke Mother    Heart disease Mother    High Cholesterol Mother    Sleep apnea Father    Hypertension Father    Food Allergy Son    Colon cancer Neg Hx    Stomach cancer Neg Hx    Allergic rhinitis Neg Hx    Angioedema Neg Hx    Asthma Neg Hx    Eczema Neg Hx    Immunodeficiency Neg Hx    Urticaria Neg Hx      Social History   Socioeconomic History   Marital status: Married    Spouse name: Not on file   Number of children: 4   Years of  education: Not on file   Highest education level: Bachelor's degree (e.g., BA, AB, BS)  Occupational History   Occupation: retired  Tobacco Use   Smoking status: Former    Current packs/day: 0.00    Average packs/day: 1 pack/day for 10.0 years (10.0 ttl  pk-yrs)    Types: Cigarettes    Start date: 62    Quit date: 66    Years since quitting: 33.6   Smokeless tobacco: Never   Tobacco comments:    Uses Hookah Pipe occasionally  Vaping Use   Vaping status: Never Used  Substance and Sexual Activity   Alcohol use: No   Drug use: No   Sexual activity: Yes  Other Topics Concern   Not on file  Social History Narrative   Marital Status: Married (Feryal)Children:  4 (3 Sons/1 Daughter)Pets:  None Living Situation: Lives with spouse and 4 childrenOrigin:  He was born in [place].Occupation: Production designer, 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  Alcohol Use:  NoneDrug Use:  NoneDiet:  RegularExercise:  NoneHobbies:  Cards   Retired    Teacher, early years/pre Strain: High Risk (01/17/2024)   Overall Financial Resource Strain (CARDIA)    Difficulty of Paying Living Expenses: Very hard  Food Insecurity: No Food Insecurity (03/27/2024)   Hunger Vital Sign    Worried About Running Out of Food in the Last Year: Never true    Ran Out of Food in the Last Year: Never true  Recent Concern: Food Insecurity - Food Insecurity Present (01/17/2024)   Hunger Vital Sign    Worried About Running Out of Food in the Last Year: Often true    Ran Out of Food in the Last Year: Sometimes true  Transportation Needs: No Transportation Needs (03/27/2024)   PRAPARE - Administrator, Civil Service (Medical): No    Lack of Transportation (Non-Medical): No  Physical Activity: Inactive (01/17/2024)   Exercise Vital Sign    Days of Exercise per Week: 0 days    Minutes of Exercise per  Session: 20 min  Stress: No Stress Concern Present (01/17/2024)   [employer] of Occupational Health - Occupational Stress Questionnaire    Feeling of Stress : Only a little  Social Connections: Socially Integrated (03/27/2024)   Social Connection and Isolation Panel    Frequency of Communication with Friends and Family: More than three times a week    Frequency of Social Gatherings with Friends and Family: More than three times a week    Attends Religious Services: More than 4 times per year    Active Member of [employer] or Organizations: Yes    Attends Engineer, structural: More than 4 times per year    Marital Status: Married  Catering manager Violence: Not At Risk (03/27/2024)   Humiliation, Afraid, Rape, and Kick questionnaire    Fear of Current or Ex-Partner: No    Emotionally Abused: No    Physically Abused: No    Sexually Abused: No     BP 110/71   Pulse 62   Ht 5' 8 (1.727 m)   SpO2 96%   BMI 36.64 kg/m   Physical Exam:  Well appearing NAD HEENT: Unremarkable Neck:  No JVD, no thyromegally Lymphatics:  No adenopathy Back:  No CVA tenderness Lungs:  Clear HEART:  Regular rate rhythm, no murmurs, no rubs, no clicks Abd:  soft, positive bowel sounds, no organomegally, no rebound, no guarding Ext:  2 plus pulses, no edema, no cyanosis, no clubbing Skin:  No rashes no nodules Neuro:  CN II through XII intact, motor grossly intact  Assess/Plan:  PAF - he is maintaining NSR very nicely. We will follow. Continue current meds. We have refilled  his scripts for flecainide , metoprol and an OAC. HTN - his bp is up. At home it is better. I encouraged him to lose weight. Obesity - he will continue his attempt at weight loss and followup in the obesity clinic. He is down 30 lbs.  Chest pain - he has non-exertional chest pain. However he has many cardiac risk factors. He has dyspnea with exertion which I think could be his anginal equivalent and his PET stress shows LAD  ischemia. He will undergo left heart cath. If he has obstructive CAD, he will need to stop the flecainide .  Richard Waddell ROLLA Richard Ashanna Heinsohn,MD

## 2024-04-26 NOTE — Therapy (Signed)
 OUTPATIENT PHYSICAL THERAPY TREATMENT   Patient Name: ODEN LINDAMAN MRN: [id number] DOB:03-22-1956, 68 y.o., male Today's Date: 04/26/2024  END OF SESSION:  PT End of Session - 04/26/24 1325     Visit Number 9    Number of Visits 24    Date for PT Re-Evaluation 06/27/24    Authorization Type [employer] 20% coinsurance    Authorization - Number of Visits 33    Progress Note Due on Visit 10    PT Start Time 1325    PT Stop Time 1425    PT Time Calculation (min) 60 min    Activity Tolerance Patient tolerated treatment well;No increased pain    Behavior During Therapy WFL for tasks assessed/performed           Past Medical History:  Diagnosis Date   Arthritis    DDD (degenerative disc disease), lumbar    L3/L4   Diverticulosis    Dysrhythmia    SVT s/p ablation   Dysrhythmia    PAF   GERD (gastroesophageal reflux disease)    Hyperlipidemia    Low sperm motility    Myocardial infarction (HCC)    Pre-diabetes    Sleep apnea    uses cpap    SVT (supraventricular tachycardia) [facility])    Past Surgical History:  Procedure Laterality Date   AMPUTATION Left 07/15/2018   Procedure: . REPAIR OF LEFT INDEX AND LONG FINGER BY FUSION /  REPAIR OF RING FINGER;  Surgeon: Sebastian Lenis, MD;  Location: [facility] OR;  Service: Orthopedics;  Laterality: Left;   BACK SURGERY     BLALOCK PROCEDURE     Ablation   CARDIAC CATHETERIZATION N/A 12/22/2015   Procedure: Left Heart Cath and Coronary Angiography;  Surgeon: Peter M Swaziland, MD;  Location: [facility] INVASIVE CV LAB;  Service: Cardiovascular;  Laterality: N/A;   CARDIAC ELECTROPHYSIOLOGY STUDY AND ABLATION     ELECTROPHYSIOLOGIC STUDY N/A 02/19/2016   Procedure: SVT Ablation;  Surgeon: Danelle LELON Birmingham, MD;  Location: [facility] INVASIVE CV LAB;  Service: Cardiovascular;  Laterality: N/A;   HAND SURGERY     septal reconstruction  2017   TOTAL KNEE ARTHROPLASTY Left 03/27/2024   Procedure: ARTHROPLASTY, KNEE, TOTAL, LEFT;  Surgeon: Addie Cordella Hamilton, MD;  Location: [facility] OR;  Service: Orthopedics;  Laterality: Left;   TURBINATE REDUCTION Bilateral 07/2016   WISDOM TOOTH EXTRACTION     Patient Active Problem List   Diagnosis Date Noted   Arthritis of left knee 04/15/2024   S/P TKR (total knee replacement), left 03/29/2024   S/P total knee arthroplasty, left 03/27/2024   Constipation 02/14/2024   Metabolic dysfunction-associated steatotic liver disease (MASLD) 01/13/2024   Abnormal food appetite 11/17/2023   Insulin  resistance 11/17/2023   Class 2 severe obesity due to excess calories with serious comorbidity and body mass index (BMI) of 35.0 to 35.9 in adult [facility]) 11/03/2023   Prediabetes 11/03/2023   Degenerative disc disease, lumbar 12/09/2022   Morbid obesity (HCC) 10/26/2022   Bronchitis 01/29/2022   OSA on CPAP 02/19/2021   Bite, snake 01/28/2021   Neck pain 11/13/2020   Vertigo 11/13/2020   Hypertension 11/03/2020   Contusion of bone 10/23/2020   Degenerative arthritis of knee, bilateral 10/23/2020   Concussion with no loss of consciousness 10/23/2020   Paroxysmal atrial fibrillation (HCC) 05/22/2019   Unspecified atrial flutter (HCC) 05/22/2019   Pain in right foot 03/30/2018   Perennial and seasonal allergic rhinitis 11/09/2017   History of food allergy 11/09/2017  Bronchitis, mucopurulent recurrent (HCC) 09/07/2017   SVT (supraventricular tachycardia) (HCC) 04/23/2015   Abdominal pain, epigastric 06/03/2013   Vitamin D  deficiency 04/09/2013   Esophageal reflux 04/09/2013   Hepatitis B carrier (HCC) 04/09/2013   Other and unspecified hyperlipidemia 04/09/2013   Sleep apnea 04/09/2013    PCP: Dallas Maxwell, PA-C  REFERRING PROVIDER: Cordella Glendia Hutchinson, MD  REFERRING DIAG:  Diagnosis  R53.1 (ICD-10-CM) - Weakness    THERAPY DIAG:  Difficulty in walking, not elsewhere classified  Muscle weakness (generalized)  Localized edema  Acute pain of left knee  Knee stiffness, left  Rationale for  Evaluation and Treatment: Rehabilitation  ONSET DATE: 03/27/2024  SUBJECTIVE:   SUBJECTIVE STATEMENT: Rawson notes he had a hard time sleeping last night.  He did ice and we discussed mixing things up today as this is his second consecutive day of supervised PT.  PERTINENT HISTORY: L3-4 lumbar DDD; pre-diabetes; previous back surgery; cardiac history  PAIN:  NPRS scale: 0-5/10 this week Pain location: Lt Knee Pain description: Ache, sore, stiff Aggravating factors: Sleeping  Relieving factors: Pain medications and ice  PRECAUTIONS: Back  RED FLAGS: None   WEIGHT BEARING RESTRICTIONS: No  FALLS:  Has patient fallen in last 6 months? No  LIVING ENVIRONMENT: Lives with: lives with their family, lives with their son, and lives with their daughter Lives in: House/apartment Stairs: Avoids stairs, walker and help for stairs now Has following equipment at home: Vannie - 2 wheeled  OCCUPATION: Retired  PLOF: Independent  PATIENT GOALS: Yard work, be more active   OBJECTIVE:  Note: Objective measures were completed at Evaluation unless otherwise noted.  DIAGNOSTIC FINDINGS: See chart  PATIENT SURVEYS:  PSFS: THE PATIENT SPECIFIC FUNCTIONAL SCALE  Place score of 0-10 (0 = unable to perform activity and 10 = able to perform activity at the same level as before injury or problem)  Activity Date: 04/04/2024 04/26/2024   Sleep 0 4   2.  Sitting 2 6   3.  Moving around 2 7   4.  Showering 1 10   Total Score 1.25  6.75     Total Score = Sum of activity scores/number of activities  Minimally Detectable Change: 3 points (for single activity); 2 points (for average score)   COGNITION: 04/04/2024 Overall cognitive status: Within functional limits for tasks assessed     SENSATION: 04/04/2024 [facility]  EDEMA:  04/04/2024 Noted and not objectively assessed   LOWER EXTREMITY ROM:  Active ROM Left/Right 04/04/2024 Left  04/05/2024 Left 04/10/2024 04/13/24 04/17/24 Left 04/19/24  Left 04/26/2024  Knee flexion 62/145 82 with belt supine 100 Supine AROM heel slide P: 100 (guarding) Seated P: 112* Supine  A: 96* A: 114 Active 125  Knee extension -5/0 -3 supine -10 in seated LAQ AROM A: -7 (seated LAQ) LAQ A: -5* Standing A: -3* P: 0 Active lacks 3 degrees   (Blank rows = not tested)  LOWER EXTREMITY STRENGTH:  MMT Right  04/04/2024 Left 04/04/2024  Hip flexion    Hip extension    Hip abduction    Hip adduction    Hip internal rotation    Hip external rotation    Knee flexion    Knee extension 4+ 2+  Ankle dorsiflexion    Ankle plantarflexion    Ankle inversion    Ankle eversion     (Blank rows = not tested)  GAIT: 04/04/2024 Distance walked: 50 feet Assistive device utilized: Environmental consultant - 2 wheeled Level of assistance: Modified independence Comments:  Zigmund was assistive device free before surgery                    TREATMENT       DATE:   04/26/2024 Recumbent bike Seat 7 for 5 minutes Level 4 Seated straight leg raises 3 sets of 5 with 0# Prone quadriceps stretch 5 x 20 seconds Prone knee extension stretch with 1# hanging from heel Seated knee flexion AAROM 10 x 10 seconds (right pushes left into flexion)  Functional Activities: Double Leg Press 125# 10 x full extension and slow eccentrics into flexion Single Leg Press 50# 10 x left side only full extension and slow eccentrics into flexion  Vaso Left knee High pressure 34 degrees 15 minutes   04/25/2024 Recumbent bike Seat 7 for 7 minutes Resistance Level 4-5 Seated straight leg raises 3 sets of 5 with 1#  Functional Activities: Double Leg Press 125# 12 x full extension and slow eccentrics into flexion Single Leg Press 50# 10 x left side only full extension and slow eccentrics into flexion  Neuromuscular re-education: Tandem balance 6 x 20 seconds Single leg stance 8 x 10 seconds  Vaso Left knee Medium pressure 34 degrees 10 minutes   04/20/2024 Therex:   Recumbent bike seat 7 partial  revolutions to full revolutions 8 mins total  Incline gastroc stretch 30 sec x 3 bilateral  Seated quad set with SLR Lt 2 x 10  Seated Lt leg LAQ 2 x 15 4 lbs with pauses in each end range.    TherActivity (to improve stairs, transfers, ambulation) Leg press double leg 75 lbs x 15 with slow lowering focus Leg press single leg 31 lbs 2 x 15 Lt leg with slow lowering focus    Vasopneumatic Device Vaso x 10 min; high pressure in elevation to Lt knee; 34 deg   PATIENT EDUCATION:  Eval:  Education details: See above Person educated: Patient and Child(ren) Education method: Explanation, Demonstration, Tactile cues, Verbal cues, and Handouts Education comprehension: verbalized understanding, returned demonstration, verbal cues required, tactile cues required, and needs further education  HOME EXERCISE PROGRAM: Access Code: [email or web address] URL: [email or web address] Date: 04/26/2024 Prepared by: Lamar Ivory  Exercises - Seated Knee Flexion AAROM  - 5 x daily - 7 x weekly - 1 sets - 10 reps - 10 seconds hold - Supine Quadricep Sets  - 5 x daily - 7 x weekly - 2 sets - 10 reps - 5 second hold - Small Range Straight Leg Raise  - 2 x daily - 7 x weekly - 3-5 sets - 5 reps - 3 seconds hold - Tandem Stance  - 1 x daily - 7 x weekly - 1 sets - 10 reps - 20 second hold - Single Leg Stance  - 1 x daily - 7 x weekly - 1 sets - 10 reps - 10 seconds hold - Prone Quadricep Stretch with Strap  - 2 x daily - 7 x weekly - 1 sets - 5-10 reps - 20 seconds hold - Prone Knee Extension with Ankle Weight  - 2 x daily - 7 x weekly - 1 sets - 1 reps - 3-5 minutes hold  ASSESSMENT:  CLINICAL IMPRESSION: Today had an emphasis on AROM, edema control and quadriceps strength.  Duward mentioned that he wants to be able to get in a prayer position that requires about 135 degrees of knee flexion.  Although it is rare to re-gain this much motion after surgery (after a TKA), his flexion AROM  is already 124  degrees and with continued work, he may be able to get in a more comfortable prayer position.  We will shift back to balance and gait activities tomorrow.  OBJECTIVE IMPAIRMENTS: Abnormal gait, decreased activity tolerance, decreased endurance, decreased knowledge of condition, difficulty walking, decreased ROM, decreased strength, increased edema, impaired perceived functional ability, and pain.   ACTIVITY LIMITATIONS: bending, sitting, standing, squatting, sleeping, stairs, transfers, bed mobility, dressing, and locomotion level  PARTICIPATION LIMITATIONS: driving, community activity, and yard work  PERSONAL FACTORS: L3-4 lumbar DDD; pre-diabetes; previous back surgery; cardiac history are also affecting patient's functional outcome.    REHAB POTENTIAL: Good  CLINICAL DECISION MAKING: Stable/uncomplicated  EVALUATION COMPLEXITY: Low   GOALS: Goals reviewed with patient? Yes  SHORT TERM GOALS: Target date: 05/16/2024 Shoichi will be independent with his day 1 home exercise program Baseline: Started 04/04/2024 Goal status: Met 04/25/2024  2.  Improve left knee active range of motion to 3 - 0 -100 degrees Baseline: 5 - 0 - 62 degrees Goal status: Met 04/25/2024  3.  Improve left quadriceps strength as assessed by MMT and functional testing Baseline: 2+/5 at evaluation Goal status: Ongoing   04/17/2024  4.  Marquie will be independent with a single-point cane for ambulation outside the home Baseline: Wheeled walker full-time Goal status: Met 04/25/2024   LONG TERM GOALS: Target date: 06/27/2024  Improve patient specific functional score to at least 5 Baseline: 0.125 Goal status: Met 04/26/2024  2.  Saeed will report left knee pain consistently 0-3/10 on the numeric pain rating scale Baseline: 10/10 Goal status: Ongoing   04/26/2024  3.  Improve left knee AROM to 2 - 0 - 110 degrees or better Baseline: 5 - 0 - 62 degrees Goal status: Partially Met 04/26/2024  4.  Improve left quadriceps  strength as assessed by MMT and Zeven's ability to ambulate without an assistive device 100% of the time Baseline: Wheeled walker full-time Goal status: Ongoing   04/26/2024  5.  Dmarius will be independent with his long-term maintenance home exercise program at discharge Baseline: Started 04/04/2024 Goal status: Ongoing   04/26/2024    PLAN:  PT FREQUENCY: 2-3x/week  PT DURATION: 12 weeks  PLANNED INTERVENTIONS: 97750- Physical Performance Testing, 97110-Therapeutic exercises, 97530- Therapeutic activity, 97112- Neuromuscular re-education, 97535- Self Care, [id number]- Manual therapy, [phone]- Gait training, [phone]- Vasopneumatic device, Patient/Family education, Balance training, Stair training, Joint mobilization, and Cryotherapy  PLAN FOR NEXT SESSION: AROM, (quadriceps) strength/balance improvements.  Balance and gait specific activities for transition from cane to no AD.  NEXT MD VISIT: 05/14/2024   Myer LELON Ivory, PT, MPT 04/26/24  2:16 PM

## 2024-04-26 NOTE — Patient Instructions (Addendum)
 Medication Instructions:  Your physician recommends that you continue on your current medications as directed. Please refer to the Current Medication list given to you today.  *If you need a refill on your cardiac medications before your next appointment, please call your pharmacy*  Lab Work: TODAY: CBC and BMET  Testing/Procedures: Cardiac Catheterization  Your physician has requested that you have a cardiac catheterization. Cardiac catheterization is used to diagnose and/or treat various heart conditions. Doctors may recommend this procedure for a number of different reasons. The most common reason is to evaluate chest pain. Chest pain can be a symptom of coronary artery disease (CAD), and cardiac catheterization can show whether plaque is narrowing or blocking your heart's arteries. This procedure is also used to evaluate the valves, as well as measure the blood flow and oxygen levels in different parts of your heart. For further information please visit [email or web address] Please follow instruction sheet, as given.   Follow-Up: At [facility], you and your health needs are our priority.  As part of our continuing mission to provide you with exceptional heart care, we have created designated Provider Care Teams.  These Care Teams include your primary Cardiologist (physician) and Advanced Practice Providers (APPs -  Physician Assistants and Nurse Practitioners) who all work together to provide you with the care you need, when you need it.   Your next appointment:   1 year(s)  The format for your next appointment:   In Person  Provider:

## 2024-04-26 NOTE — H&P (View-Only) (Signed)
 HPI [name] returns today for followup. He is a pleasant 68 yo man with SVT s/p ablation who developed palpitations and was found to have PAF. He has been maintained on fairly high dose flecainide  which at times he has not taken. He wore a cardiac monitor several months ago and was found to have occaisional PAC's and PVC's but no sustained arrhythmias. His dyspnea is improved. He has started seeing the weight loss clinic and has lost over 30 lbs. He has started Zepbound  as he has sleep apnea. He has had a PET scan which showed LAD ischemia. He has dyspnea with exertion which I think is his anginal equivalent. Allergies  Allergen Reactions   Dust Mite Extract    Pork-Derived Products Swelling   Statins     Muscle aches   Tree Extract Other (See Comments)    Intolerance Grass     Current Outpatient Medications  Medication Sig Dispense Refill   acetaminophen  (TYLENOL ) 325 MG tablet Take 1-2 tablets (325-650 mg total) by mouth every 6 (six) hours as needed for mild pain (pain score 1-3) or fever (or temp > 100.5). 60 tablet 0   albuterol  (PROAIR  HFA) 108 (90 Base) MCG/ACT inhaler Inhale 1-2 puffs into the lungs every 6 (six) hours as needed for wheezing or shortness of breath. 24 g 3   azelastine  (ASTELIN ) 0.1 % nasal spray 1-2 sprays per nostril 2 times daily as needed. 90 mL 3   Budeson-Glycopyrrol-Formoterol  (BREZTRI  AEROSPHERE) 160-9-4.8 MCG/ACT AERO Inhale 2 puffs into the lungs in the morning and at bedtime. 10.7 g 11   docusate sodium  (COLACE) 100 MG capsule Take 1 capsule (100 mg total) by mouth 2 (two) times daily. 30 capsule 0   ezetimibe  (ZETIA ) 10 MG tablet Take 1 tablet (10 mg total) by mouth daily. 90 tablet 3   fenofibrate  (TRICOR ) 145 MG tablet Take 1 tablet (145 mg total) by mouth daily. 90 tablet 3   flecainide  (TAMBOCOR ) 150 MG tablet Take 150 mg by mouth 2 (two) times daily.     gabapentin  (NEURONTIN ) 100 MG capsule Take 1 capsule (100 mg total) by mouth 2 (two)  times daily. 60 capsule 0   gabapentin  (NEURONTIN ) 100 MG capsule Take 1 capsule (100 mg total) by mouth 2 (two) times daily. 42 capsule 0   ibuprofen  (ADVIL ) 600 MG tablet 1 po bid prn 30 tablet 0   losartan  (COZAAR ) 25 MG tablet Take 1 tablet (25 mg total) by mouth daily. 90 tablet 3   Magnesium  250 MG TABS Take 250 mg by mouth every 7 (seven) days.     metFORMIN  (GLUCOPHAGE ) 500 MG tablet TAKE 1 TABLET(500 MG) BY MOUTH TWICE DAILY WITH A MEAL 180 tablet 3   methocarbamol  (ROBAXIN ) 500 MG tablet Take 1 tablet (500 mg total) by mouth every 8 (eight) hours as needed for muscle spasms. 30 tablet 0   metoprolol  tartrate (LOPRESSOR ) 25 MG tablet Take one tablet by mouth twice a day.  May take one additional tablet by mouth as needed for breakthrough palpitations. 270 tablet 3   oxyCODONE  (OXY IR/ROXICODONE ) 5 MG immediate release tablet Take 1 tablet (5 mg total) by mouth every 4 (four) hours as needed for moderate pain (pain score 4-6) (pain score 4-6). 30 tablet 0   Oxycodone  HCl 10 MG TABS 1 po q 4hrs prn pain 45 tablet 0   pantoprazole  (PROTONIX ) 40 MG tablet Take 1 tablet (40 mg total) by mouth daily. 30 tablet 11  PRESCRIPTION MEDICATION Inhale into the lungs at bedtime. CPAP     rivaroxaban  (XARELTO ) 20 MG TABS tablet Take 1 tablet (20 mg total) by mouth daily with supper. 90 tablet 3   sildenafil  (VIAGRA ) 100 MG tablet Take 1 tablet (100 mg total) by mouth daily as needed for erectile dysfunction. 30 tablet 3   Spacer/Aero-Holding Chambers (AEROCHAMBER MV) inhaler Use as instructed 1 each 0   tirzepatide  (ZEPBOUND ) 7.5 MG/0.5ML Pen Inject 7.5 mg into the skin once a week. 2 mL 0   Vitamin D , Ergocalciferol , (DRISDOL ) 1.25 MG (50000 UNIT) CAPS capsule Take 1 capsule (50,000 Units total) by mouth every 7 (seven) days. 8 capsule 3   No current facility-administered medications for this visit.     Past Medical History:  Diagnosis Date   Arthritis    DDD (degenerative disc disease), lumbar     L3/L4   Diverticulosis    Dysrhythmia    SVT s/p ablation   Dysrhythmia    PAF   GERD (gastroesophageal reflux disease)    Hyperlipidemia    Low sperm motility    Myocardial infarction (HCC)    Pre-diabetes    Sleep apnea    uses cpap    SVT (supraventricular tachycardia) (HCC)     ROS:   All systems reviewed and negative except as noted in the HPI.   Past Surgical History:  Procedure Laterality Date   AMPUTATION Left 07/15/2018   Procedure: . REPAIR OF LEFT INDEX AND LONG FINGER BY FUSION /  REPAIR OF RING FINGER;  Surgeon: Sebastian Lenis, MD;  Location: [facility] OR;  Service: Orthopedics;  Laterality: Left;   BACK SURGERY     BLALOCK PROCEDURE     Ablation   CARDIAC CATHETERIZATION N/A 12/22/2015   Procedure: Left Heart Cath and Coronary Angiography;  Surgeon: Peter M Swaziland, MD;  Location: [facility] INVASIVE CV LAB;  Service: Cardiovascular;  Laterality: N/A;   CARDIAC ELECTROPHYSIOLOGY STUDY AND ABLATION     ELECTROPHYSIOLOGIC STUDY N/A 02/19/2016   Procedure: SVT Ablation;  Surgeon: Richard LELON Birmingham, MD;  Location: [facility] INVASIVE CV LAB;  Service: Cardiovascular;  Laterality: N/A;   HAND SURGERY     septal reconstruction  2017   TOTAL KNEE ARTHROPLASTY Left 03/27/2024   Procedure: ARTHROPLASTY, KNEE, TOTAL, LEFT;  Surgeon: Addie Cordella Hamilton, MD;  Location: [facility] OR;  Service: Orthopedics;  Laterality: Left;   TURBINATE REDUCTION Bilateral 07/2016   WISDOM TOOTH EXTRACTION       Family History  Problem Relation Age of Onset   Stroke Mother    Heart disease Mother    High Cholesterol Mother    Sleep apnea Father    Hypertension Father    Food Allergy Son    Colon cancer Neg Hx    Stomach cancer Neg Hx    Allergic rhinitis Neg Hx    Angioedema Neg Hx    Asthma Neg Hx    Eczema Neg Hx    Immunodeficiency Neg Hx    Urticaria Neg Hx      Social History   Socioeconomic History   Marital status: Married    Spouse name: Not on file   Number of children: 4   Years of  education: Not on file   Highest education level: Bachelor's degree (e.g., BA, AB, BS)  Occupational History   Occupation: retired  Tobacco Use   Smoking status: Former    Current packs/day: 0.00    Average packs/day: 1 pack/day for 10.0 years (10.0 ttl  pk-yrs)    Types: Cigarettes    Start date: 62    Quit date: 66    Years since quitting: 33.6   Smokeless tobacco: Never   Tobacco comments:    Uses Hookah Pipe occasionally  Vaping Use   Vaping status: Never Used  Substance and Sexual Activity   Alcohol use: No   Drug use: No   Sexual activity: Yes  Other Topics Concern   Not on file  Social History Narrative   Marital Status: Married (Feryal)Children:  4 (3 Sons/1 Daughter)Pets:  None Living Situation: Lives with spouse and 4 childrenOrigin:  He was born in [place].Occupation: Production designer, 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  Alcohol Use:  NoneDrug Use:  NoneDiet:  RegularExercise:  NoneHobbies:  Cards   Retired    Teacher, early years/pre Strain: High Risk (01/17/2024)   Overall Financial Resource Strain (CARDIA)    Difficulty of Paying Living Expenses: Very hard  Food Insecurity: No Food Insecurity (03/27/2024)   Hunger Vital Sign    Worried About Running Out of Food in the Last Year: Never true    Ran Out of Food in the Last Year: Never true  Recent Concern: Food Insecurity - Food Insecurity Present (01/17/2024)   Hunger Vital Sign    Worried About Running Out of Food in the Last Year: Often true    Ran Out of Food in the Last Year: Sometimes true  Transportation Needs: No Transportation Needs (03/27/2024)   PRAPARE - Administrator, Civil Service (Medical): No    Lack of Transportation (Non-Medical): No  Physical Activity: Inactive (01/17/2024)   Exercise Vital Sign    Days of Exercise per Week: 0 days    Minutes of Exercise per  Session: 20 min  Stress: No Stress Concern Present (01/17/2024)   [employer] of Occupational Health - Occupational Stress Questionnaire    Feeling of Stress : Only a little  Social Connections: Socially Integrated (03/27/2024)   Social Connection and Isolation Panel    Frequency of Communication with Friends and Family: More than three times a week    Frequency of Social Gatherings with Friends and Family: More than three times a week    Attends Religious Services: More than 4 times per year    Active Member of [employer] or Organizations: Yes    Attends Engineer, structural: More than 4 times per year    Marital Status: Married  Catering manager Violence: Not At Risk (03/27/2024)   Humiliation, Afraid, Rape, and Kick questionnaire    Fear of Current or Ex-Partner: No    Emotionally Abused: No    Physically Abused: No    Sexually Abused: No     BP 110/71   Pulse 62   Ht 5' 8 (1.727 m)   SpO2 96%   BMI 36.64 kg/m   Physical Exam:  Well appearing NAD HEENT: Unremarkable Neck:  No JVD, no thyromegally Lymphatics:  No adenopathy Back:  No CVA tenderness Lungs:  Clear HEART:  Regular rate rhythm, no murmurs, no rubs, no clicks Abd:  soft, positive bowel sounds, no organomegally, no rebound, no guarding Ext:  2 plus pulses, no edema, no cyanosis, no clubbing Skin:  No rashes no nodules Neuro:  CN II through XII intact, motor grossly intact  Assess/Plan:  PAF - he is maintaining NSR very nicely. We will follow. Continue current meds. We have refilled  his scripts for flecainide , metoprol and an OAC. HTN - his bp is up. At home it is better. I encouraged him to lose weight. Obesity - he will continue his attempt at weight loss and followup in the obesity clinic. He is down 30 lbs.  Chest pain - he has non-exertional chest pain. However he has many cardiac risk factors. He has dyspnea with exertion which I think could be his anginal equivalent and his PET stress shows LAD  ischemia. He will undergo left heart cath. If he has obstructive CAD, he will need to stop the flecainide .  Richard Walker Richard Ashanna Heinsohn,MD

## 2024-04-27 ENCOUNTER — Encounter: Payer: Self-pay | Admitting: Rehabilitative and Restorative Service Providers"

## 2024-04-27 ENCOUNTER — Ambulatory Visit: Payer: Medicare (Managed Care) | Admitting: Rehabilitative and Restorative Service Providers"

## 2024-04-27 DIAGNOSIS — R262 Difficulty in walking, not elsewhere classified: Secondary | ICD-10-CM

## 2024-04-27 DIAGNOSIS — R600 Localized edema: Secondary | ICD-10-CM | POA: Diagnosis not present

## 2024-04-27 DIAGNOSIS — M6281 Muscle weakness (generalized): Secondary | ICD-10-CM | POA: Diagnosis not present

## 2024-04-27 DIAGNOSIS — M25562 Pain in left knee: Secondary | ICD-10-CM | POA: Diagnosis not present

## 2024-04-27 DIAGNOSIS — M25662 Stiffness of left knee, not elsewhere classified: Secondary | ICD-10-CM

## 2024-04-27 LAB — BASIC METABOLIC PANEL WITH GFR
BUN/Creatinine Ratio: 12 (ref 10–24)
BUN: 14 mg/dL (ref 8–27)
CO2: 24 mmol/L (ref 20–29)
Calcium: 10 mg/dL (ref 8.6–10.2)
Chloride: 99 mmol/L (ref 96–106)
Creatinine, Ser: 1.2 mg/dL (ref 0.76–1.27)
Glucose: 87 mg/dL (ref 70–99)
Potassium: 5 mmol/L (ref 3.5–5.2)
Sodium: 138 mmol/L (ref 134–144)
eGFR: 66 mL/min/1.73 (ref >59)

## 2024-04-27 LAB — CBC
Hematocrit: 45.2 % (ref 37.5–51.0)
Hemoglobin: 14.5 g/dL (ref 13.0–17.7)
MCH: 27.8 pg (ref 26.6–33.0)
MCHC: 32.1 g/dL (ref 31.5–35.7)
MCV: 87 fL (ref 79–97)
Platelets: 239 x10E3/uL (ref 150–450)
RBC: 5.21 x10E6/uL (ref 4.14–5.80)
RDW: 13.4 % (ref 11.6–15.4)
WBC: 6.3 x10E3/uL (ref 3.4–10.8)

## 2024-04-27 NOTE — Therapy (Signed)
 OUTPATIENT PHYSICAL THERAPY TREATMENT/PROGRESS NOTE  Progress Note Reporting Period 04/04/2024 to 04/27/2024  See note below for Objective Data and Assessment of Progress/Goals.    Patient Name: Richard Walker MRN: [id number] DOB:1956/01/29, 68 y.o., male Today's Date: 04/27/2024  END OF SESSION:  PT End of Session - 04/27/24 1351     Visit Number 10    Number of Visits 24    Date for PT Re-Evaluation 06/27/24    Authorization Type [employer] 20% coinsurance    Authorization - Number of Visits 33    Progress Note Due on Visit 10    PT Start Time 1347    PT Stop Time 1441    PT Time Calculation (min) 54 min    Activity Tolerance Patient tolerated treatment well;No increased pain    Behavior During Therapy WFL for tasks assessed/performed            Past Medical History:  Diagnosis Date   Arthritis    DDD (degenerative disc disease), lumbar    L3/L4   Diverticulosis    Dysrhythmia    SVT s/p ablation   Dysrhythmia    PAF   GERD (gastroesophageal reflux disease)    Hyperlipidemia    Low sperm motility    Myocardial infarction (HCC)    Pre-diabetes    Sleep apnea    uses cpap    SVT (supraventricular tachycardia) [facility])    Past Surgical History:  Procedure Laterality Date   AMPUTATION Left 07/15/2018   Procedure: . REPAIR OF LEFT INDEX AND LONG FINGER BY FUSION /  REPAIR OF RING FINGER;  Surgeon: Sebastian Lenis, MD;  Location: [facility] OR;  Service: Orthopedics;  Laterality: Left;   BACK SURGERY     BLALOCK PROCEDURE     Ablation   CARDIAC CATHETERIZATION N/A 12/22/2015   Procedure: Left Heart Cath and Coronary Angiography;  Surgeon: Peter M Swaziland, MD;  Location: [facility] INVASIVE CV LAB;  Service: Cardiovascular;  Laterality: N/A;   CARDIAC ELECTROPHYSIOLOGY STUDY AND ABLATION     ELECTROPHYSIOLOGIC STUDY N/A 02/19/2016   Procedure: SVT Ablation;  Surgeon: Danelle LELON Birmingham, MD;  Location: [facility] INVASIVE CV LAB;  Service: Cardiovascular;  Laterality: N/A;   HAND SURGERY      septal reconstruction  2017   TOTAL KNEE ARTHROPLASTY Left 03/27/2024   Procedure: ARTHROPLASTY, KNEE, TOTAL, LEFT;  Surgeon: Addie Cordella Hamilton, MD;  Location[facility] OR;  Service: Orthopedics;  Laterality: Left;   TURBINATE REDUCTION Bilateral 07/2016   WISDOM TOOTH EXTRACTION     Patient Active Problem List   Diagnosis Date Noted   Arthritis of left knee 04/15/2024   S/P TKR (total knee replacement), left 03/29/2024   S/P total knee arthroplasty, left 03/27/2024   Constipation 02/14/2024   Metabolic dysfunction-associated steatotic liver disease (MASLD) 01/13/2024   Abnormal food appetite 11/17/2023   Insulin  resistance 11/17/2023   Class 2 severe obesity due to excess calories with serious comorbidity and body mass index (BMI) of 35.0 to 35.9 in adult [facility]) 11/03/2023   Prediabetes 11/03/2023   Degenerative disc disease, lumbar 12/09/2022   Morbid obesity (HCC) 10/26/2022   Bronchitis 01/29/2022   OSA on CPAP 02/19/2021   Bite, snake 01/28/2021   Neck pain 11/13/2020   Vertigo 11/13/2020   Hypertension 11/03/2020   Contusion of bone 10/23/2020   Degenerative arthritis of knee, bilateral 10/23/2020   Concussion with no loss of consciousness 10/23/2020   Paroxysmal atrial fibrillation (HCC) 05/22/2019   Unspecified atrial flutter (HCC) 05/22/2019  Pain in right foot 03/30/2018   Perennial and seasonal allergic rhinitis 11/09/2017   History of food allergy 11/09/2017   Bronchitis, mucopurulent recurrent (HCC) 09/07/2017   SVT (supraventricular tachycardia) (HCC) 04/23/2015   Abdominal pain, epigastric 06/03/2013   Vitamin D  deficiency 04/09/2013   Esophageal reflux 04/09/2013   Hepatitis B carrier (HCC) 04/09/2013   Other and unspecified hyperlipidemia 04/09/2013   Sleep apnea 04/09/2013    PCP: Dallas Maxwell, PA-C  REFERRING PROVIDER: Cordella Glendia Hutchinson, MD  REFERRING DIAG:  Diagnosis  R53.1 (ICD-10-CM) - Weakness    THERAPY DIAG:  Difficulty in walking, not  elsewhere classified  Muscle weakness (generalized)  Localized edema  Acute pain of left knee  Knee stiffness, left  Rationale for Evaluation and Treatment: Rehabilitation  ONSET DATE: 03/27/2024  SUBJECTIVE:   SUBJECTIVE STATEMENT: Nichola notes he has a hard time sleeping.  Some nights are better than others.  He is doing a good job with his early home and clinic exercises.  PERTINENT HISTORY: L3-4 lumbar DDD; pre-diabetes; previous back surgery; cardiac history  PAIN:  NPRS scale: 0-5/10 this week Pain location: Lt Knee Pain description: Ache, sore, stiff Aggravating factors: Sleeping  Relieving factors: Pain medications and ice  PRECAUTIONS: Back  RED FLAGS: None   WEIGHT BEARING RESTRICTIONS: No  FALLS:  Has patient fallen in last 6 months? No  LIVING ENVIRONMENT: Lives with: lives with their family, lives with their son, and lives with their daughter Lives in: House/apartment Stairs: Avoids stairs, walker and help for stairs now Has following equipment at home: Vannie - 2 wheeled  OCCUPATION: Retired  PLOF: Independent  PATIENT GOALS: Yard work, be more active   OBJECTIVE:  Note: Objective measures were completed at Evaluation unless otherwise noted.  DIAGNOSTIC FINDINGS: See chart  PATIENT SURVEYS:  PSFS: THE PATIENT SPECIFIC FUNCTIONAL SCALE  Place score of 0-10 (0 = unable to perform activity and 10 = able to perform activity at the same level as before injury or problem)  Activity Date: 04/04/2024 04/26/2024   Sleep 0 4   2.  Sitting 2 6   3.  Moving around 2 7   4.  Showering 1 10   Total Score 1.25  6.75     Total Score = Sum of activity scores/number of activities  Minimally Detectable Change: 3 points (for single activity); 2 points (for average score)   COGNITION: 04/04/2024 Overall cognitive status: Within functional limits for tasks assessed     SENSATION: 04/04/2024 [facility]  EDEMA:  04/04/2024 Noted and not objectively  assessed   LOWER EXTREMITY ROM:  Active ROM Left/Right 04/04/2024 Left  04/05/2024 Left 04/10/2024 04/13/24 04/17/24 Left 04/19/24 Left 04/26/2024  Knee flexion 62/145 82 with belt supine 100 Supine AROM heel slide P: 100 (guarding) Seated P: 112* Supine  A: 96* A: 114 Active 125  Knee extension -5/0 -3 supine -10 in seated LAQ AROM A: -7 (seated LAQ) LAQ A: -5* Standing A: -3* P: 0 Active lacks 3 degrees   (Blank rows = not tested)  LOWER EXTREMITY STRENGTH:  MMT Right  04/04/2024 Left 04/04/2024 Left/Right 04/27/2024  Hip flexion     Hip extension     Hip abduction     Hip adduction     Hip internal rotation     Hip external rotation     Knee flexion     Knee extension 4+ 2+ 25.7/81.1 in pounds (236 body weight)  Ankle dorsiflexion     Ankle plantarflexion  Ankle inversion     Ankle eversion      (Blank rows = not tested)  GAIT: 04/04/2024 Distance walked: 50 feet Assistive device utilized: Environmental consultant - 2 wheeled Level of assistance: Modified independence Comments: Alvon was assistive device free before surgery                    TREATMENT       DATE:   04/27/2024 Recumbent bike Seat 7 for 6 minutes Level 6, Fartlek training (:50 at a speed above 8 MPH followed by :10 of sprinting above 12 MPH) Seated straight leg raises 3 sets of 5 with 0# (cues to maintain knee extension throughout) Prone quadriceps stretch 5 x 20 seconds Prone knee extension stretch with 1# hanging from heel 2 minutes Seated knee flexion AAROM 10 x 10 seconds (right pushes left into flexion)  Functional Activities: Mini sit to stand in parallel bars 5 x  Neuromuscular Re-education: Tandem balance 4 x 20 seconds Single Leg Stance 4 x 10 seconds each side  Vaso Left knee High pressure 34 degrees 15 minutes   04/26/2024 Recumbent bike Seat 7 for 5 minutes Level 4 Seated straight leg raises 3 sets of 5 with 0# Prone quadriceps stretch 5 x 20 seconds Prone knee extension stretch with 1# hanging from  heel Seated knee flexion AAROM 10 x 10 seconds (right pushes left into flexion)  Functional Activities: Double Leg Press 125# 10 x full extension and slow eccentrics into flexion Single Leg Press 50# 10 x left side only full extension and slow eccentrics into flexion  Vaso Left knee High pressure 34 degrees 15 minutes   04/25/2024 Recumbent bike Seat 7 for 7 minutes Resistance Level 4-5 Seated straight leg raises 3 sets of 5 with 1#  Functional Activities: Double Leg Press 125# 12 x full extension and slow eccentrics into flexion Single Leg Press 50# 10 x left side only full extension and slow eccentrics into flexion  Neuromuscular re-education: Tandem balance 6 x 20 seconds Single leg stance 8 x 10 seconds  Vaso Left knee Medium pressure 34 degrees 10 minutes   PATIENT EDUCATION:  Eval:  Education details: See above Person educated: Patient and Child(ren) Education method: Explanation, Demonstration, Tactile cues, Verbal cues, and Handouts Education comprehension: verbalized understanding, returned demonstration, verbal cues required, tactile cues required, and needs further education  HOME EXERCISE PROGRAM: Access Code: [email or web address] URL: [email or web address] Date: 04/26/2024 Prepared by: Lamar Ivory  Exercises - Seated Knee Flexion AAROM  - 5 x daily - 7 x weekly - 1 sets - 10 reps - 10 seconds hold - Supine Quadricep Sets  - 5 x daily - 7 x weekly - 2 sets - 10 reps - 5 second hold - Small Range Straight Leg Raise  - 2 x daily - 7 x weekly - 3-5 sets - 5 reps - 3 seconds hold - Tandem Stance  - 1 x daily - 7 x weekly - 1 sets - 10 reps - 20 second hold - Single Leg Stance  - 1 x daily - 7 x weekly - 1 sets - 10 reps - 10 seconds hold - Prone Quadricep Stretch with Strap  - 2 x daily - 7 x weekly - 1 sets - 5-10 reps - 20 seconds hold - Prone Knee Extension with Ankle Weight  - 2 x daily - 7 x weekly - 1 sets - 1 reps - 3-5 minutes  hold  ASSESSMENT:  CLINICAL IMPRESSION: AROM is 0 -  3 - 125 degrees, which is excellent.  Quadriceps strength is low and is being addressed with Erika's current home and clinic program.  He will benefit from the plan of care recommended at evaluation.  OBJECTIVE IMPAIRMENTS: Abnormal gait, decreased activity tolerance, decreased endurance, decreased knowledge of condition, difficulty walking, decreased ROM, decreased strength, increased edema, impaired perceived functional ability, and pain.   ACTIVITY LIMITATIONS: bending, sitting, standing, squatting, sleeping, stairs, transfers, bed mobility, dressing, and locomotion level  PARTICIPATION LIMITATIONS: driving, community activity, and yard work  PERSONAL FACTORS: L3-4 lumbar DDD; pre-diabetes; previous back surgery; cardiac history are also affecting patient's functional outcome.    REHAB POTENTIAL: Good  CLINICAL DECISION MAKING: Stable/uncomplicated  EVALUATION COMPLEXITY: Low   GOALS: Goals reviewed with patient? Yes  SHORT TERM GOALS: Target date: 05/16/2024 Shamari will be independent with his day 1 home exercise program Baseline: Started 04/04/2024 Goal status: Met 04/25/2024  2.  Improve left knee active range of motion to 3 - 0 -100 degrees Baseline: 5 - 0 - 62 degrees Goal status: Met 04/25/2024  3.  Improve left quadriceps strength as assessed by MMT and functional testing Baseline: 2+/5 at evaluation Goal status: Ongoing   04/27/2024  4.  Jachin will be independent with a single-point cane for ambulation outside the home Baseline: Wheeled walker full-time Goal status: Met 04/25/2024   LONG TERM GOALS: Target date: 06/27/2024  Improve patient specific functional score to at least 5 Baseline: 0.125 Goal status: Met 04/26/2024  2.  Porter will report left knee pain consistently 0-3/10 on the numeric pain rating scale Baseline: 10/10 Goal status: Ongoing   04/26/2024  3.  Improve left knee AROM to 2 - 0 - 110 degrees or  better Baseline: 5 - 0 - 62 degrees Goal status: Partially Met 04/26/2024  4.  Improve left quadriceps strength as assessed by MMT and Aras's ability to ambulate without an assistive device 100% of the time Baseline: Wheeled walker full-time Goal status: Ongoing   04/26/2024  5.  Haskel will be independent with his long-term maintenance home exercise program at discharge Baseline: Started 04/04/2024 Goal status: Ongoing   04/26/2024    PLAN:  PT FREQUENCY: 2-3x/week  PT DURATION: 12 weeks  PLANNED INTERVENTIONS: 97750- Physical Performance Testing, 97110-Therapeutic exercises, 97530- Therapeutic activity, 97112- Neuromuscular re-education, 97535- Self Care, [id number]- Manual therapy, [phone]- Gait training, [phone]- Vasopneumatic device, Patient/Family education, Balance training, Stair training, Joint mobilization, and Cryotherapy  PLAN FOR NEXT SESSION: AROM, (quadriceps) strength/balance improvements.  Balance and gait specific activities for transition from cane to no AD.  NEXT MD VISIT: 05/14/2024   Myer LELON Ivory, PT, MPT 04/27/24  2:30 PM

## 2024-04-30 ENCOUNTER — Telehealth: Payer: Self-pay | Admitting: Orthopedic Surgery

## 2024-04-30 ENCOUNTER — Encounter: Payer: Self-pay | Admitting: Rehabilitative and Restorative Service Providers"

## 2024-04-30 ENCOUNTER — Ambulatory Visit (INDEPENDENT_AMBULATORY_CARE_PROVIDER_SITE_OTHER): Payer: Medicare (Managed Care) | Admitting: Rehabilitative and Restorative Service Providers"

## 2024-04-30 DIAGNOSIS — R262 Difficulty in walking, not elsewhere classified: Secondary | ICD-10-CM

## 2024-04-30 DIAGNOSIS — R600 Localized edema: Secondary | ICD-10-CM

## 2024-04-30 DIAGNOSIS — M6281 Muscle weakness (generalized): Secondary | ICD-10-CM

## 2024-04-30 DIAGNOSIS — M25562 Pain in left knee: Secondary | ICD-10-CM

## 2024-04-30 DIAGNOSIS — M25662 Stiffness of left knee, not elsewhere classified: Secondary | ICD-10-CM

## 2024-04-30 NOTE — Telephone Encounter (Signed)
 Patient would like refills on his pain medication and muscle relaxer.

## 2024-04-30 NOTE — Therapy (Signed)
 OUTPATIENT PHYSICAL THERAPY TREATMENT   Patient Name: Richard Walker MRN: [id number] DOB:09-18-1955, 67 y.o., male Today's Date: 04/30/2024  END OF SESSION:  PT End of Session - 04/30/24 1521     Visit Number 11    Number of Visits 24    Date for PT Re-Evaluation 06/27/24    Authorization Type [employer] 20% coinsurance    Authorization - Number of Visits 33    Progress Note Due on Visit 20    PT Start Time 1512    PT Stop Time 1558    PT Time Calculation (min) 46 min    Activity Tolerance Patient tolerated treatment well    Behavior During Therapy WFL for tasks assessed/performed             Past Medical History:  Diagnosis Date   Arthritis    DDD (degenerative disc disease), lumbar    L3/L4   Diverticulosis    Dysrhythmia    SVT s/p ablation   Dysrhythmia    PAF   GERD (gastroesophageal reflux disease)    Hyperlipidemia    Low sperm motility    Myocardial infarction (HCC)    Pre-diabetes    Sleep apnea    uses cpap    SVT (supraventricular tachycardia) (HCC)    Past Surgical History:  Procedure Laterality Date   AMPUTATION Left 07/15/2018   Procedure: . REPAIR OF LEFT INDEX AND LONG FINGER BY FUSION /  REPAIR OF RING FINGER;  Surgeon: Sebastian Lenis, MD;  Location: [facility] OR;  Service: Orthopedics;  Laterality: Left;   BACK SURGERY     BLALOCK PROCEDURE     Ablation   CARDIAC CATHETERIZATION N/A 12/22/2015   Procedure: Left Heart Cath and Coronary Angiography;  Surgeon: Peter M Swaziland, MD;  Location: [facility] INVASIVE CV LAB;  Service: Cardiovascular;  Laterality: N/A;   CARDIAC ELECTROPHYSIOLOGY STUDY AND ABLATION     ELECTROPHYSIOLOGIC STUDY N/A 02/19/2016   Procedure: SVT Ablation;  Surgeon: Danelle LELON Birmingham, MD;  Location: [facility] INVASIVE CV LAB;  Service: Cardiovascular;  Laterality: N/A;   HAND SURGERY     septal reconstruction  2017   TOTAL KNEE ARTHROPLASTY Left 03/27/2024   Procedure: ARTHROPLASTY, KNEE, TOTAL, LEFT;  Surgeon: Addie Cordella Hamilton, MD;   Location: [facility] OR;  Service: Orthopedics;  Laterality: Left;   TURBINATE REDUCTION Bilateral 07/2016   WISDOM TOOTH EXTRACTION     Patient Active Problem List   Diagnosis Date Noted   Arthritis of left knee 04/15/2024   S/P TKR (total knee replacement), left 03/29/2024   S/P total knee arthroplasty, left 03/27/2024   Constipation 02/14/2024   Metabolic dysfunction-associated steatotic liver disease (MASLD) 01/13/2024   Abnormal food appetite 11/17/2023   Insulin  resistance 11/17/2023   Class 2 severe obesity due to excess calories with serious comorbidity and body mass index (BMI) of 35.0 to 35.9 in adult [facility]) 11/03/2023   Prediabetes 11/03/2023   Degenerative disc disease, lumbar 12/09/2022   Morbid obesity (HCC) 10/26/2022   Bronchitis 01/29/2022   OSA on CPAP 02/19/2021   Bite, snake 01/28/2021   Neck pain 11/13/2020   Vertigo 11/13/2020   Hypertension 11/03/2020   Contusion of bone 10/23/2020   Degenerative arthritis of knee, bilateral 10/23/2020   Concussion with no loss of consciousness 10/23/2020   Paroxysmal atrial fibrillation (HCC) 05/22/2019   Unspecified atrial flutter (HCC) 05/22/2019   Pain in right foot 03/30/2018   Perennial and seasonal allergic rhinitis 11/09/2017   History of food allergy 11/09/2017  Bronchitis, mucopurulent recurrent (HCC) 09/07/2017   SVT (supraventricular tachycardia) (HCC) 04/23/2015   Abdominal pain, epigastric 06/03/2013   Vitamin D  deficiency 04/09/2013   Esophageal reflux 04/09/2013   Hepatitis B carrier (HCC) 04/09/2013   Other and unspecified hyperlipidemia 04/09/2013   Sleep apnea 04/09/2013    PCP: Dallas Maxwell, PA-C  REFERRING PROVIDER: Cordella Glendia Hutchinson, MD  REFERRING DIAG:  Diagnosis  R53.1 (ICD-10-CM) - Weakness    THERAPY DIAG:  Difficulty in walking, not elsewhere classified  Muscle weakness (generalized)  Localized edema  Acute pain of left knee  Knee stiffness, left  Rationale for Evaluation and  Treatment: Rehabilitation  ONSET DATE: 03/27/2024  SUBJECTIVE:   SUBJECTIVE STATEMENT: Pt indicated being out of pain medicine and left message for MD about it.  Reported this week was a little better so far for pain than last week.  Using the walker to help walk more normally , used it over the weekend.   PERTINENT HISTORY: L3-4 lumbar DDD; pre-diabetes; previous back surgery; cardiac history  PAIN:  NPRS scale: upon arrival: 8.5 Pain location: Lt Knee Pain description: Ache, sore, stiff Aggravating factors: Sleeping  Relieving factors: Pain medications and ice  PRECAUTIONS: Back  RED FLAGS: None   WEIGHT BEARING RESTRICTIONS: No  FALLS:  Has patient fallen in last 6 months? No  LIVING ENVIRONMENT: Lives with: lives with their family, lives with their son, and lives with their daughter Lives in: House/apartment Stairs: Avoids stairs, walker and help for stairs now Has following equipment at home: Vannie - 2 wheeled  OCCUPATION: Retired  PLOF: Independent  PATIENT GOALS: Yard work, be more active   OBJECTIVE:  Note: Objective measures were completed at Evaluation unless otherwise noted.  DIAGNOSTIC FINDINGS: See chart  PATIENT SURVEYS:  PSFS: THE PATIENT SPECIFIC FUNCTIONAL SCALE  Place score of 0-10 (0 = unable to perform activity and 10 = able to perform activity at the same level as before injury or problem)  Activity Date: 04/04/2024 04/26/2024   Sleep 0 4   2.  Sitting 2 6   3.  Moving around 2 7   4.  Showering 1 10   Total Score 1.25  6.75     Total Score = Sum of activity scores/number of activities  Minimally Detectable Change: 3 points (for single activity); 2 points (for average score)   COGNITION: 04/04/2024 Overall cognitive status: Within functional limits for tasks assessed     SENSATION: 04/04/2024 [facility]  EDEMA:  04/04/2024 Noted and not objectively assessed   LOWER EXTREMITY ROM:  Active ROM Left/Right 04/04/2024 Left   04/05/2024 Left 04/10/2024 04/13/24 04/17/24 Left 04/19/24 Left 04/26/2024  Knee flexion 62/145 82 with belt supine 100 Supine AROM heel slide P: 100 (guarding) Seated P: 112* Supine  A: 96* A: 114 Active 125  Knee extension -5/0 -3 supine -10 in seated LAQ AROM A: -7 (seated LAQ) LAQ A: -5* Standing A: -3* P: 0 Active lacks 3 degrees   (Blank rows = not tested)  LOWER EXTREMITY STRENGTH:  MMT Right  04/04/2024 Left 04/04/2024 Right 04/27/2024 Left 04/30/2024  Hip flexion      Hip extension      Hip abduction      Hip adduction      Hip internal rotation      Hip external rotation      Knee flexion      Knee extension 4+ 2+ 81.1 in pounds (236 body weight) 25.7 lbs  Ankle dorsiflexion  Ankle plantarflexion      Ankle inversion      Ankle eversion       (Blank rows = not tested)  GAIT: 04/30/2024: Ambulation c FWW today with improve sequencing noted compared to [facility] in last visits.   04/04/2024 Distance walked: 50 feet Assistive device utilized: Environmental consultant - 2 wheeled Level of assistance: Modified independence Comments: Cornelio was assistive device free before surgery                    TREATMENT       DATE: 04/30/2024 Therex: Recumbent bike seat 7 lvl 5 10 mins with :10 second faster top of minute from 2 mins to 8.  Supine heel slide, quad set/SLR combo x 10 Lt leg  Supine TKE stretch start of vaso to tolerance approx 3 mins.  Education for continued use at home.   Neuro  Tandem stance on foam in // bars with occasional HHA 1 min x 2 bilateral  Step over and back 4 inch step with forward/back stepping with single hand on bar x 10 each LE    TherActivity Double Leg Press 125lbs  x 15 full extension and slow eccentrics into flexion Single Leg Press 50lbs  2 x 15 Lt full extension and slow eccentrics into flexion  Vaso: Lt knee 34 deg high compression 10 mins in elevation.  TKE stretch in supine to start     TREATMENT       DATE:  04/27/2024 Recumbent bike Seat 7 for 6 minutes  Level 6, [place]lek training (:50 at a speed above 8 MPH followed by :10 of sprinting above 12 MPH) Seated straight leg raises 3 sets of 5 with 0# (cues to maintain knee extension throughout) Prone quadriceps stretch 5 x 20 seconds Prone knee extension stretch with 1# hanging from heel 2 minutes Seated knee flexion AAROM 10 x 10 seconds (right pushes left into flexion)  Functional Activities: Mini sit to stand in parallel bars 5 x  Neuromuscular Re-education: Tandem balance 4 x 20 seconds Single Leg Stance 4 x 10 seconds each side  Vaso Left knee High pressure 34 degrees 15 minutes   TREATMENT       DATE:04/26/2024 Recumbent bike Seat 7 for 5 minutes Level 4 Seated straight leg raises 3 sets of 5 with 0# Prone quadriceps stretch 5 x 20 seconds Prone knee extension stretch with 1# hanging from heel Seated knee flexion AAROM 10 x 10 seconds (right pushes left into flexion)  Functional Activities: Double Leg Press 125# 10 x full extension and slow eccentrics into flexion Single Leg Press 50# 10 x left side only full extension and slow eccentrics into flexion  Vaso Left knee High pressure 34 degrees 15 minutes   TREATMENT       DATE:04/25/2024 Recumbent bike Seat 7 for 7 minutes Resistance Level 4-5 Seated straight leg raises 3 sets of 5 with 1#  Functional Activities: Double Leg Press 125# 12 x full extension and slow eccentrics into flexion Single Leg Press 50# 10 x left side only full extension and slow eccentrics into flexion  Neuromuscular re-education: Tandem balance 6 x 20 seconds Single leg stance 8 x 10 seconds  Vaso Left knee Medium pressure 34 degrees 10 minutes   PATIENT EDUCATION:  Eval:  Education details: See above Person educated: Patient and Child(ren) Education method: Explanation, Demonstration, Tactile cues, Verbal cues, and Handouts Education comprehension: verbalized understanding, returned demonstration, verbal cues required, tactile cues required, and  needs further  education  HOME EXERCISE PROGRAM: Access Code: [email or web address] URL: [email or web address] Date: 04/26/2024 Prepared by: Lamar Ivory  Exercises - Seated Knee Flexion AAROM  - 5 x daily - 7 x weekly - 1 sets - 10 reps - 10 seconds hold - Supine Quadricep Sets  - 5 x daily - 7 x weekly - 2 sets - 10 reps - 5 second hold - Small Range Straight Leg Raise  - 2 x daily - 7 x weekly - 3-5 sets - 5 reps - 3 seconds hold - Tandem Stance  - 1 x daily - 7 x weekly - 1 sets - 10 reps - 20 second hold - Single Leg Stance  - 1 x daily - 7 x weekly - 1 sets - 10 reps - 10 seconds hold - Prone Quadricep Stretch with Strap  - 2 x daily - 7 x weekly - 1 sets - 5-10 reps - 20 seconds hold - Prone Knee Extension with Ankle Weight  - 2 x daily - 7 x weekly - 1 sets - 1 reps - 3-5 minutes hold  ASSESSMENT:  CLINICAL IMPRESSION: Ambulation sequencing and affect were improved today with FWW ambulation.  Intervention did not seem to worse symptoms ind\iated upon arrival. Encouraged continued strengthening and mobility intervention to continue progression despite consistent resting symptom levels (eye on not worsening symptoms in activity).  Continued skilled PT services indicated at this time.     OBJECTIVE IMPAIRMENTS: Abnormal gait, decreased activity tolerance, decreased endurance, decreased knowledge of condition, difficulty walking, decreased ROM, decreased strength, increased edema, impaired perceived functional ability, and pain.   ACTIVITY LIMITATIONS: bending, sitting, standing, squatting, sleeping, stairs, transfers, bed mobility, dressing, and locomotion level  PARTICIPATION LIMITATIONS: driving, community activity, and yard work  PERSONAL FACTORS: L3-4 lumbar DDD; pre-diabetes; previous back surgery; cardiac history are also affecting patient's functional outcome.    REHAB POTENTIAL: Good  CLINICAL DECISION MAKING: Stable/uncomplicated  EVALUATION COMPLEXITY:  Low   GOALS: Goals reviewed with patient? Yes  SHORT TERM GOALS: Target date: 05/16/2024 Haze will be independent with his day 1 home exercise program Baseline: Started 04/04/2024 Goal status: Met 04/25/2024  2.  Improve left knee active range of motion to 3 - 0 -100 degrees Baseline: 5 - 0 - 62 degrees Goal status: Met 04/25/2024  3.  Improve left quadriceps strength as assessed by MMT and functional testing Baseline: 2+/5 at evaluation Goal status: Ongoing   04/27/2024  4.  Jackie will be independent with a single-point cane for ambulation outside the home Baseline: Wheeled walker full-time Goal status: Met 04/25/2024   LONG TERM GOALS: Target date: 06/27/2024  Improve patient specific functional score to at least 5 Baseline: 0.125 Goal status: Met 04/26/2024  2.  Terance will report left knee pain consistently 0-3/10 on the numeric pain rating scale Baseline: 10/10 Goal status: Ongoing   04/26/2024  3.  Improve left knee AROM to 2 - 0 - 110 degrees or better Baseline: 5 - 0 - 62 degrees Goal status: Partially Met 04/26/2024  4.  Improve left quadriceps strength as assessed by MMT and [place]'s ability to ambulate without an assistive device 100% of the time Baseline: Wheeled walker full-time Goal status: Ongoing   04/26/2024  5.  Montel will be independent with his long-term maintenance home exercise program at discharge Baseline: Started 04/04/2024 Goal status: Ongoing   04/26/2024    PLAN:  PT FREQUENCY: 2-3x/week  PT DURATION: 12 weeks  PLANNED INTERVENTIONS: 97750- Physical Performance Testing, 97110-Therapeutic exercises, 97530-  Therapeutic activity, [id number]- Neuromuscular re-education, [id number]- Self Care, [id number]- Manual therapy, [phone]- Gait training, [phone]- Vasopneumatic device, Patient/Family education, Balance training, Stair training, Joint mobilization, and Cryotherapy  PLAN FOR NEXT SESSION: Continued WB acceptance improvements, stability gains with quad strengthening.   NEXT MD VISIT:  05/14/2024  Ozell Silvan, PT, DPT, OCS, ATC 04/30/24  3:53 PM

## 2024-05-01 ENCOUNTER — Ambulatory Visit (INDEPENDENT_AMBULATORY_CARE_PROVIDER_SITE_OTHER): Payer: Medicare (Managed Care) | Admitting: Internal Medicine

## 2024-05-01 ENCOUNTER — Other Ambulatory Visit (HOSPITAL_COMMUNITY): Payer: Self-pay

## 2024-05-01 ENCOUNTER — Encounter (HOSPITAL_COMMUNITY)
Admission: RE | Disposition: A | Discharge status: Home / Self Care | Payer: Self-pay | Source: Home / Self Care | Attending: Cardiology

## 2024-05-01 ENCOUNTER — Ambulatory Visit (HOSPITAL_COMMUNITY)
Admission: RE | Admit: 2024-05-01 | Discharge: 2024-05-02 | Disposition: A | Discharge status: Home / Self Care | Payer: Medicare (Managed Care) | Attending: Cardiology | Admitting: Cardiology

## 2024-05-01 ENCOUNTER — Other Ambulatory Visit: Payer: Self-pay

## 2024-05-01 ENCOUNTER — Encounter (HOSPITAL_COMMUNITY): Payer: Self-pay | Admitting: Cardiology

## 2024-05-01 ENCOUNTER — Other Ambulatory Visit: Payer: Self-pay | Admitting: Surgical

## 2024-05-01 DIAGNOSIS — R9439 Abnormal result of other cardiovascular function study: Secondary | ICD-10-CM | POA: Diagnosis present

## 2024-05-01 DIAGNOSIS — I2510 Atherosclerotic heart disease of native coronary artery without angina pectoris: Secondary | ICD-10-CM

## 2024-05-01 DIAGNOSIS — Z79899 Other long term (current) drug therapy: Secondary | ICD-10-CM | POA: Diagnosis not present

## 2024-05-01 DIAGNOSIS — Z7901 Long term (current) use of anticoagulants: Secondary | ICD-10-CM | POA: Diagnosis not present

## 2024-05-01 DIAGNOSIS — Z6836 Body mass index (BMI) 36.0-36.9, adult: Secondary | ICD-10-CM | POA: Insufficient documentation

## 2024-05-01 DIAGNOSIS — Z7982 Long term (current) use of aspirin: Secondary | ICD-10-CM | POA: Diagnosis not present

## 2024-05-01 DIAGNOSIS — I10 Essential (primary) hypertension: Secondary | ICD-10-CM | POA: Diagnosis not present

## 2024-05-01 DIAGNOSIS — E669 Obesity, unspecified: Secondary | ICD-10-CM | POA: Diagnosis not present

## 2024-05-01 DIAGNOSIS — Z7902 Long term (current) use of antithrombotics/antiplatelets: Secondary | ICD-10-CM | POA: Diagnosis not present

## 2024-05-01 DIAGNOSIS — I25119 Atherosclerotic heart disease of native coronary artery with unspecified angina pectoris: Secondary | ICD-10-CM | POA: Insufficient documentation

## 2024-05-01 DIAGNOSIS — I2584 Coronary atherosclerosis due to calcified coronary lesion: Secondary | ICD-10-CM | POA: Insufficient documentation

## 2024-05-01 DIAGNOSIS — I480 Paroxysmal atrial fibrillation: Secondary | ICD-10-CM | POA: Insufficient documentation

## 2024-05-01 DIAGNOSIS — Z955 Presence of coronary angioplasty implant and graft: Secondary | ICD-10-CM

## 2024-05-01 HISTORY — PX: LEFT HEART CATH AND CORONARY ANGIOGRAPHY: CATH118249

## 2024-05-01 HISTORY — PX: CORONARY STENT INTERVENTION: CATH118234

## 2024-05-01 LAB — GLUCOSE, CAPILLARY: Glucose-Capillary: 106 mg/dL — ABNORMAL HIGH (ref 70–99)

## 2024-05-01 LAB — POCT ACTIVATED CLOTTING TIME
Activated Clotting Time: 233 s
Activated Clotting Time: 279 s

## 2024-05-01 SURGERY — LEFT HEART CATH AND CORONARY ANGIOGRAPHY
Anesthesia: LOCAL

## 2024-05-01 MED ORDER — RIVAROXABAN 20 MG PO TABS: 20.0000 mg | ORAL_TABLET | Freq: Every day | ORAL | Status: DC

## 2024-05-01 MED ORDER — MIDAZOLAM HCL 2 MG/2ML IJ SOLN
INTRAMUSCULAR | Status: AC
  Filled 2024-05-01: qty 2

## 2024-05-01 MED ORDER — SODIUM CHLORIDE 0.9% FLUSH: 3.0000 mL | INTRAVENOUS | Status: DC | PRN

## 2024-05-01 MED ORDER — ASPIRIN 81 MG PO CHEW
81.0000 mg | CHEWABLE_TABLET | ORAL | Status: AC
Start: 2024-05-01 — End: 2024-05-01
  Administered 2024-05-01: 81 mg via ORAL
  Filled 2024-05-01: qty 1

## 2024-05-01 MED ORDER — ROSUVASTATIN CALCIUM 10 MG PO TABS
10.0000 mg | ORAL_TABLET | Freq: Every day | ORAL | 2 refills | Status: DC
Start: 2024-05-01 — End: 2024-05-21
  Filled 2024-05-01: qty 30, 30d supply, fill #0

## 2024-05-01 MED ORDER — MIDAZOLAM HCL 2 MG/2ML IJ SOLN
INTRAMUSCULAR | Status: DC | PRN
  Administered 2024-05-01: 2 mg via INTRAVENOUS

## 2024-05-01 MED ORDER — CLOPIDOGREL BISULFATE 300 MG PO TABS
ORAL_TABLET | ORAL | Status: DC | PRN
  Administered 2024-05-01: 600 mg via ORAL

## 2024-05-01 MED ORDER — OXYCODONE HCL 5 MG PO TABS
5.0000 mg | ORAL_TABLET | ORAL | Status: DC | PRN
  Administered 2024-05-01: 10 mg via ORAL
  Filled 2024-05-01: qty 2

## 2024-05-01 MED ORDER — METHOCARBAMOL 500 MG PO TABS: 500.0000 mg | ORAL_TABLET | Freq: Three times a day (TID) | ORAL | 0 refills | Status: DC | PRN

## 2024-05-01 MED ORDER — FREE WATER: 500.0000 mL | Freq: Once | Status: DC

## 2024-05-01 MED ORDER — ACETAMINOPHEN 325 MG PO TABS: 650.0000 mg | ORAL_TABLET | ORAL | Status: DC | PRN

## 2024-05-01 MED ORDER — ASPIRIN 81 MG PO TBEC
81.0000 mg | DELAYED_RELEASE_TABLET | Freq: Every day | ORAL | 0 refills | Status: DC
  Filled 2024-05-01: qty 15, 15d supply, fill #0

## 2024-05-01 MED ORDER — NITROGLYCERIN 1 MG/10 ML FOR IR/CATH LAB
INTRA_ARTERIAL | Status: DC | PRN
  Administered 2024-05-01: 200 ug via INTRACORONARY

## 2024-05-01 MED ORDER — VERAPAMIL HCL 2.5 MG/ML IV SOLN
INTRAVENOUS | Status: DC | PRN
  Administered 2024-05-01: 10 mL via INTRA_ARTERIAL

## 2024-05-01 MED ORDER — NITROGLYCERIN 1 MG/10 ML FOR IR/CATH LAB
INTRA_ARTERIAL | Status: AC
  Filled 2024-05-01: qty 10

## 2024-05-01 MED ORDER — FENTANYL CITRATE (PF) 100 MCG/2ML IJ SOLN
INTRAMUSCULAR | Status: DC | PRN
  Administered 2024-05-01: 25 ug via INTRAVENOUS
  Administered 2024-05-01: 50 ug via INTRAVENOUS
  Administered 2024-05-01: 25 ug via INTRAVENOUS

## 2024-05-01 MED ORDER — ASPIRIN 81 MG PO CHEW: 81.0000 mg | CHEWABLE_TABLET | ORAL | Status: DC

## 2024-05-01 MED ORDER — HEPARIN SODIUM (PORCINE) 1000 UNIT/ML IJ SOLN
INTRAMUSCULAR | Status: AC
  Filled 2024-05-01: qty 10

## 2024-05-01 MED ORDER — CLOPIDOGREL BISULFATE 75 MG PO TABS
75.0000 mg | ORAL_TABLET | Freq: Every day | ORAL | 5 refills | Status: DC
  Filled 2024-05-01: qty 30, 30d supply, fill #0

## 2024-05-01 MED ORDER — CLOPIDOGREL BISULFATE 300 MG PO TABS
ORAL_TABLET | ORAL | Status: AC
  Filled 2024-05-01: qty 2

## 2024-05-01 MED ORDER — VERAPAMIL HCL 2.5 MG/ML IV SOLN
INTRAVENOUS | Status: AC
  Filled 2024-05-01: qty 2

## 2024-05-01 MED ORDER — ONDANSETRON HCL 4 MG/2ML IJ SOLN: 4.0000 mg | Freq: Four times a day (QID) | INTRAMUSCULAR | Status: DC | PRN

## 2024-05-01 MED ORDER — SODIUM CHLORIDE 0.9% FLUSH: 3.0000 mL | Freq: Two times a day (BID) | INTRAVENOUS | Status: DC

## 2024-05-01 MED ORDER — SODIUM CHLORIDE 0.9 % IV SOLN: 250.0000 mL | INTRAVENOUS | Status: DC | PRN

## 2024-05-01 MED ORDER — HEPARIN SODIUM (PORCINE) 1000 UNIT/ML IJ SOLN
INTRAMUSCULAR | Status: DC | PRN
  Administered 2024-05-01: 6000 [IU] via INTRAVENOUS
  Administered 2024-05-01: 2000 [IU] via INTRAVENOUS
  Administered 2024-05-01: 4000 [IU] via INTRAVENOUS
  Administered 2024-05-01: 1000 [IU] via INTRAVENOUS

## 2024-05-01 MED ORDER — NITROGLYCERIN 0.4 MG SL SUBL
0.4000 mg | SUBLINGUAL_TABLET | SUBLINGUAL | 0 refills | Status: DC | PRN
  Filled 2024-05-01: qty 25, 8d supply, fill #0

## 2024-05-01 MED ORDER — HEPARIN (PORCINE) IN NACL 1000-0.9 UT/500ML-% IV SOLN
INTRAVENOUS | Status: DC | PRN
  Administered 2024-05-01 (×3): 500 mL

## 2024-05-01 MED ORDER — LIDOCAINE HCL (PF) 1 % IJ SOLN
INTRAMUSCULAR | Status: DC | PRN
  Administered 2024-05-01: 2 mL via INTRADERMAL

## 2024-05-01 MED ORDER — OXYCODONE HCL 5 MG PO TABS: 5.0000 mg | ORAL_TABLET | ORAL | 0 refills | Status: DC | PRN

## 2024-05-01 MED ORDER — FAMOTIDINE IN NACL 20-0.9 MG/50ML-% IV SOLN
INTRAVENOUS | Status: DC | PRN
  Administered 2024-05-01: 20 mg via INTRAVENOUS

## 2024-05-01 MED ORDER — FENTANYL CITRATE (PF) 100 MCG/2ML IJ SOLN
INTRAMUSCULAR | Status: AC
  Filled 2024-05-01: qty 2

## 2024-05-01 MED ORDER — LIDOCAINE HCL (PF) 1 % IJ SOLN
INTRAMUSCULAR | Status: AC
  Filled 2024-05-01: qty 30

## 2024-05-01 MED ORDER — HEPARIN SODIUM (PORCINE) 1000 UNIT/ML IJ SOLN
INTRAMUSCULAR | Status: AC
Start: 2024-05-01 — End: 2024-05-01
  Filled 2024-05-01: qty 10

## 2024-05-01 MED ORDER — FAMOTIDINE IN NACL 20-0.9 MG/50ML-% IV SOLN
INTRAVENOUS | Status: AC
  Filled 2024-05-01: qty 50

## 2024-05-01 SURGICAL SUPPLY — 15 items
BALLOON EMERGE MR 2.5X15 (BALLOONS) IMPLANT
BALLOON SAPPHIRE NC24 3.5X10 (BALLOONS) IMPLANT
CATH INFINITI AMBI 5FR TG (CATHETERS) IMPLANT
CATH VISTA GUIDE 6FR XB3.5 EPK (CATHETERS) IMPLANT
DEVICE RAD COMP TR BAND LRG (VASCULAR PRODUCTS) IMPLANT
GLIDESHEATH SLEND SS 6F .021 (SHEATH) IMPLANT
GUIDEWIRE INQWIRE 1.5J.035X260 (WIRE) IMPLANT
KIT ENCORE 26 ADVANTAGE (KITS) IMPLANT
KIT HEMO VALVE WATCHDOG (MISCELLANEOUS) IMPLANT
PACK CARDIAC CATHETERIZATION (CUSTOM PROCEDURE TRAY) ×1 IMPLANT
SET ATX-X65L (MISCELLANEOUS) IMPLANT
STENT SYNERGY XD 2.75X20 (Permanent Stent) IMPLANT
STENT SYNERGY XD 3.0X28 (Permanent Stent) IMPLANT
WIRE MICROINTRODUCER 60CM (WIRE) IMPLANT
WIRE RUNTHROUGH .014X180CM (WIRE) IMPLANT

## 2024-05-01 NOTE — Telephone Encounter (Signed)
 Sent in refills. Decreased dosage on oxycodone  due to need to taper off

## 2024-05-01 NOTE — Discharge Instructions (Addendum)
 Drink plenty of fluid for 48 hours and keep wrist elevated at heart level for 24 hours  Radial Site Care   This sheet gives you information about how to care for yourself after your procedure. Your health care provider may also give you more specific instructions. If you have problems or questions, contact your health care provider. What can I expect after the procedure? After the procedure, it is common to have: Bruising and tenderness at the catheter insertion area. Follow these instructions at home: Medicines Take over-the-counter and prescription medicines only as told by your health care provider. Insertion site care Follow instructions from your health care provider about how to take care of your insertion site. Make sure you: Wash your hands with soap and water  before you change your bandage (dressing). If soap and water  are not available, use hand sanitizer. remove your dressing as told by your health care provider. In 24 hours Check your insertion site every day for signs of infection. Check for: Redness, swelling, or pain. Fluid or blood. Pus or a bad smell. Warmth. Do not take baths, swim, or use a hot tub until your health care provider approves. You may shower 24-48 hours after the procedure, or as directed by your health care provider. Remove the dressing and gently wash the site with plain soap and water . Pat the area dry with a clean towel. Do not rub the site. That could cause bleeding. Do not apply powder or lotion to the site. Activity   For 24 hours after the procedure, or as directed by your health care provider: Do not flex or bend the affected arm. Do not push or pull heavy objects with the affected arm. Do not drive yourself home from the hospital or clinic. You may drive 24 hours after the procedure unless your health care provider tells you not to. Do not operate machinery or power tools. Do not lift anything that is heavier than 10 lb (4.5 kg), or the  limit that you are told, until your health care provider says that it is safe. For 4 days Ask your health care provider when it is okay to: Return to work or school. Resume usual physical activities or sports. Resume sexual activity. General instructions If the catheter site starts to bleed, raise your arm and put firm pressure on the site. If the bleeding does not stop, get help right away. This is a medical emergency. If you went home on the same day as your procedure, a responsible adult should be with you for the first 24 hours after you arrive home. Keep all follow-up visits as told by your health care provider. This is important. Contact a health care provider if: You have a fever. You have redness, swelling, or yellow drainage around your insertion site. Get help right away if: You have unusual pain at the radial site. The catheter insertion area swells very fast. The insertion area is bleeding, and the bleeding does not stop when you hold steady pressure on the area. Your arm or hand becomes pale, cool, tingly, or numb. These symptoms may represent a serious problem that is an emergency. Do not wait to see if the symptoms will go away. Get medical help right away. Call your local emergency services (911 in the [place] Do not drive yourself to the hospital. Summary After the procedure, it is common to have bruising and tenderness at the site. Follow instructions from your health care provider about how to take care of your radial  site wound. Check the wound every day for signs of infection. Do not lift anything that is heavier than 10 lb (4.5 kg), or the limit that you are told, until your health care provider says that it is safe. This information is not intended to replace advice given to you by your health care provider. Make sure you discuss any questions you have with your health care provider. Document Revised: 09/14/2017 Document Reviewed: 09/14/2017 Elsevier Patient Education   2020 [employer].     Information about your medication: Plavix  (anti-platelet agent)  Generic Name (Brand): clopidogrel  (Plavix ), once daily medication  PURPOSE: You are taking this medication along with aspirin  to lower your chance of having a heart attack, stroke, or blood clots in your heart stent. These can be fatal. Plavix  and aspirin  help prevent platelets from sticking together and forming a clot that can block an artery or your stent.   Common SIDE EFFECTS you may experience include: bruising or bleeding more easily, shortness of breath  Do not stop taking PLAVIX  without talking to the doctor who prescribes it for you. People who are treated with a stent and stop taking Plavix  too soon, have a higher risk of getting a blood clot in the stent, having a heart attack, or dying. If you stop Plavix  because of bleeding, or for other reasons, your risk of a heart attack or stroke may increase.   Avoid taking NSAID agents or anti-inflammatory medications such as ibuprofen , naproxen given increased bleed risk with plavix  - can use acetaminophen  (Tylenol ) if needed for pain.  Avoid taking over the counter stomach medications omeprazole (Prilosec) or esomeprazole  (Nexium ) since these do interact and make plavix  less effective - ask your pharmacist or doctor for alterative agents if needed for heartburn or GERD.   Tell all of your doctors and dentists that you are taking Plavix . They should talk to the doctor who prescribed Plavix  for you before you have any surgery or invasive procedure.   Contact your health care provider if you experience: severe or uncontrollable bleeding, pink/red/brown urine, vomiting blood or vomit that looks like coffee grounds, red or black stools (looks like tar), coughing up blood or blood clots ----------------------------------------------------------------------------------------------------------------------   De-escalation of Triple Therapy  Post-PCI  Underwent cardiac catheterization with placement of drug-eluting stent on 05/01/2024. Plan for triple therapy with aspirin  81 mg and clopidogrel  (Plavix ) in addition to oral anticoagulation using rivaroxaban  (Xarelto ). Plan to discontinue aspirin  on 05/15/2024 (after 2 weeks from procedure).      Information about cardiac stent: https://www.heart.org/en/health-topics/heart-attack/treatment-of-a-heart-attack/stent#:~:text=Watch%40for%20complications%20after%20getting,as%20a%20cardiologist%20or%20pulmonologist.

## 2024-05-01 NOTE — Interval H&P Note (Signed)
 History and Physical Interval Note:  05/01/2024 8:28 AM  Richard Walker  has presented today for surgery, with the diagnosis of abnormal stress test.  The various methods of treatment have been discussed with the patient and family. After consideration of risks, benefits and other options for treatment, the patient has consented to  Procedure(s): LEFT HEART CATH AND CORONARY ANGIOGRAPHY (N/A) and possible coronary angioplasty as a surgical intervention.  The patient's history has been reviewed, patient examined, no change in status, stable for surgery.  I have reviewed the patient's chart and labs.  Questions were answered to the patient's satisfaction.   Cath Lab Visit (complete for each Cath Lab visit)  Clinical Evaluation Leading to the Procedure:   ACS: No.  Non-ACS:    Anginal Classification: CCS II  Anti-ischemic medical therapy: Minimal Therapy (1 class of medications)  Non-Invasive Test Results: Intermediate-risk stress test findings: cardiac mortality 1-3%/year  Prior CABG: No previous CABG    Richard Walker

## 2024-05-01 NOTE — Discharge Summary (Signed)
 Discharge Summary for Same Day PCI   Patient ID: NORTH ESTERLINE MRN: [id number]; DOB: 06/30/1956  Admit date: 05/01/2024 Discharge date: 05/01/2024  Primary Care Provider: Dorina Loving, PA-C  Primary Cardiologist: None  Primary Electrophysiologist:  Danelle Birmingham, MD   Discharge Diagnoses    Active Problems:   CAD (coronary artery disease)   Diagnostic Studies/Procedures    Cardiac Catheterization 05/01/2024:  Hemodynamic data: LVEDP 12 mmHg.  No pressure gradient across the aortic valve.   Angiographic data: LM: Very large caliber vessel, normal. LCx: Very large caliber vessel giving origin to a small OM1, large OM 2 with a secondary branch, inferior branch has a ostial 50% stenosis.  Mid Cx has mild disease after the origin of OM2 with a 30% stenosis.  A moderate-sized OM 3 is evident. LAD: It is a large-caliber vessel and mildly diffusely calcified.  After the origin of a moderate-sized D1, LAD is occluded with faint microchannel's evident filling the distal vessel, D2 is small to moderate size with ostial 50 to 60% stenosis.  The mid to distal LAD is severely diffusely diseased with a focal 80% after CTO followed by a mid to distal 90% stenosis.  There is contralateral collaterals from RCA to the mid to distal LAD. RCA: It is a large-caliber vessel with a proximal 30% stenosis.  Moderate-sized PDA and PL branches, PDA gives collaterals to the LAD.   Intervention data: Successful PTCA and stenting of the proximal and mid LAD with implantation of 3.0 x 28 and a 2.75 x 20 mm Synergy XD DES, proximal stent optimization with 3.5 x 10 mm emerge [place] balloon at 14 atmospheric pressure overall stenosis reduced from 99-100% mid LAD occlusion to 0% with TIMI 0 to TIMI-3 flow at the end of the procedure.  Balloon angioplasty only with a 2.5 x 15 mm emerge to the distal LAD 90% stenosis reduced to 0%.      Impression and recommendations: Patient will be on aspirin  81 mg daily for 2 weeks  followed by Plavix  for 6 months, continue Xarelto  indefinitely for PAF, flecainide  discontinued.   _____________   History of Present Illness     Richard Walker is a 68 y.o. male with PMH of SVT s/p ablation, paroxsymal atrial fibrillation on flecainide . Seen for dyspnea and underwent outpatient cardiac PET scan showing LAD ischemia. Seen back in the office on 9/4 with Dr. Birmingham. It was discussed to undergo outpatient cardiac catheterization.   Hospital Course     The patient underwent cardiac cath as noted above with successful PCI/DES x2 to mLAD. Plan for triple therapy with ASA/plavix /eliquis for at least 2 weeks then drop ASA with continuation of plavix /eliquis. The patient was seen by cardiac rehab while in short stay. There were no observed complications post cath. Radial cath site was re-evaluated prior to discharge and found to be stable without any complications. Instructions/precautions regarding cath site care were given prior to discharge.  Richard Walker was seen by Dr. Ladona and determined stable for discharge home. Follow up with our office has been arranged. Medications are listed below. Pertinent changes include plavix , ASA. ____________  Cath/PCI Registry Performance & Quality Measures: Aspirin  prescribed? - Yes ADP Receptor Inhibitor (Plavix /Clopidogrel , Brilinta/Ticagrelor or Effient/Prasugrel) prescribed (includes medically managed patients)? - Yes High Intensity Statin (Lipitor 40-80mg  or Crestor  20-40mg ) prescribed? - No - intolerant to atorvastatin previously, agreeable to crestor  10mg  daily For EF <40%, was ACEI/ARB prescribed? - Not Applicable (EF >/= 40%) For EF <40%, Aldosterone  Antagonist (Spironolactone or Eplerenone) prescribed? - Not Applicable (EF >/= 40%) Cardiac Rehab Phase II ordered (Included Medically managed Patients)? - Yes  _____________   Discharge Vitals Blood pressure 137/85, pulse 61, temperature 98 F (36.7 C), temperature source Oral, resp.  rate 18, height 5' 8 (1.727 m), weight 107 kg, SpO2 97%.  Filed Weights   05/01/24 0741  Weight: 107 kg    Last Labs & Radiologic Studies    CBC No results for input(s): WBC, NEUTROABS, HGB, HCT, MCV, PLT in the last 72 hours. Basic Metabolic Panel No results for input(s): NA, K, CL, CO2, GLUCOSE, BUN, CREATININE, CALCIUM , MG, PHOS in the last 72 hours. Liver Function Tests No results for input(s): AST, ALT, ALKPHOS, BILITOT, PROT, ALBUMIN in the last 72 hours. No results for input(s): LIPASE, AMYLASE in the last 72 hours. High Sensitivity Troponin:   No results for input(s): TROPONINIHS in the last 720 hours.  BNP Invalid input(s): POCBNP D-Dimer No results for input(s): DDIMER in the last 72 hours. Hemoglobin A1C No results for input(s): HGBA1C in the last 72 hours. Fasting Lipid Panel No results for input(s): CHOL, HDL, LDLCALC, TRIG, CHOLHDL, LDLDIRECT in the last 72 hours. Thyroid  Function Tests No results for input(s): TSH, T4TOTAL, T3FREE, THYROIDAB in the last 72 hours.  Invalid input(s): FREET3 _____________  CARDIAC CATHETERIZATION Result Date: 05/01/2024 Images from the original result were not included. Cardiac Catheterization 05/01/24: Hemodynamic data: LVEDP 12 mmHg.  No pressure gradient across the aortic valve. Angiographic data: LM: Very large caliber vessel, normal. LCx: Very large caliber vessel giving origin to a small OM1, large OM 2 with a secondary branch, inferior branch has a ostial 50% stenosis.  Mid Cx has mild disease after the origin of OM2 with a 30% stenosis.  A moderate-sized OM 3 is evident. LAD: It is a large-caliber vessel and mildly diffusely calcified.  After the origin of a moderate-sized D1, LAD is occluded with faint microchannel's evident filling the distal vessel, D2 is small to moderate size with ostial 50 to 60% stenosis.  The mid to distal LAD is severely diffusely  diseased with a focal 80% after CTO followed by a mid to distal 90% stenosis.  There is contralateral collaterals from RCA to the mid to distal LAD. RCA: It is a large-caliber vessel with a proximal 30% stenosis.  Moderate-sized PDA and PL branches, PDA gives collaterals to the LAD. Intervention data: Successful PTCA and stenting of the proximal and mid LAD with implantation of 3.0 x 28 and a 2.75 x 20 mm Synergy XD DES, proximal stent optimization with 3.5 x 10 mm emerge [place] balloon at 14 atmospheric pressure overall stenosis reduced from 99-100% mid LAD occlusion to 0% with TIMI 0 to TIMI-3 flow at the end of the procedure.  Balloon angioplasty only with a 2.5 x 15 mm emerge to the distal LAD 90% stenosis reduced to 0%. Impression and recommendations: Patient will be on aspirin  81 mg daily for 2 weeks followed by Plavix  for 6 months, continue Xarelto  indefinitely for PAF, flecainide  discontinued.   XR Knee 1-2 Views Left Result Date: 04/12/2024 AP lateral radiographs left knee reviewed.  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   Disposition   Pt is being discharged home today in good condition.  Follow-up Plans & Appointments     Discharge Instructions     Amb Referral to Cardiac Rehabilitation   Complete by: As directed    Diagnosis: Coronary Stents  After initial evaluation and assessments completed: Virtual Based Care may be provided alone or in conjunction with Phase 2 Cardiac Rehab based on patient barriers.: Yes   Intensive Cardiac Rehabilitation (ICR) [facility] location only OR Traditional Cardiac Rehabilitation (TCR) *If criteria for ICR are not met will enroll in TCR [facility] only): Yes        Discharge Medications   Allergies as of 05/01/2024       Reactions   Dust Mite Extract    Statins    Muscle aches   Tree Extract Other (See Comments)   Intolerance Grass        Medication List     STOP taking these medications     flecainide  150 MG tablet Commonly known as: TAMBOCOR    ibuprofen  200 MG tablet Commonly known as: ADVIL        TAKE these medications    albuterol  108 (90 Base) MCG/ACT inhaler Commonly known as: ProAir  HFA Inhale 1-2 puffs into the lungs every 6 (six) hours as needed for wheezing or shortness of breath.   aspirin  EC 81 MG tablet Take 1 tablet (81 mg total) by mouth daily. Swallow whole.   azelastine  0.1 % nasal spray Commonly known as: ASTELIN  1-2 sprays per nostril 2 times daily as needed.   Breztri  Aerosphere 160-9-4.8 MCG/ACT Aero inhaler Generic drug: budesonide -glycopyrrolate -formoterol  Inhale 2 puffs into the lungs in the morning and at bedtime.   cholecalciferol  25 MCG (1000 UNIT) tablet Commonly known as: VITAMIN D3 Take 4,000 Units by mouth daily.   clopidogrel  75 MG tablet Commonly known as: Plavix  Take 1 tablet (75 mg total) by mouth daily.   docusate sodium  100 MG capsule Commonly known as: Colace Take 1 capsule (100 mg total) by mouth 2 (two) times daily.   ezetimibe  10 MG tablet Commonly known as: Zetia  Take 1 tablet (10 mg total) by mouth daily.   fenofibrate  145 MG tablet Commonly known as: TRICOR  Take 1 tablet (145 mg total) by mouth daily.   gabapentin  100 MG capsule Commonly known as: NEURONTIN  Take 1 capsule (100 mg total) by mouth 2 (two) times daily.   losartan  25 MG tablet Commonly known as: COZAAR  Take 1 tablet (25 mg total) by mouth daily.   Magnesium  250 MG Tabs Take 250 mg by mouth once a week.   metFORMIN  500 MG tablet Commonly known as: GLUCOPHAGE  TAKE 1 TABLET(500 MG) BY MOUTH TWICE DAILY WITH A MEAL   methocarbamol  500 MG tablet Commonly known as: ROBAXIN  Take 1 tablet (500 mg total) by mouth every 8 (eight) hours as needed for muscle spasms.   metoprolol  tartrate 25 MG tablet Commonly known as: LOPRESSOR  Take one tablet by mouth twice a day.  May take one additional tablet by mouth as needed for breakthrough  palpitations.   nitroGLYCERIN  0.4 MG SL tablet Commonly known as: NITROSTAT  Place 1 tablet (0.4 mg total) under the tongue every 5 (five) minutes as needed for up to 25 days for chest pain. Do not use for 24 hours after use of Viagra  Notes to patient: DO NOT TAKE NITROGLYCERIN  WITHIN 24 OF HOURS OF VIAGRA    Oxycodone  HCl 10 MG Tabs 1 po q 4hrs prn pain   pantoprazole  40 MG tablet Commonly known as: PROTONIX  Take 1 tablet (40 mg total) by mouth daily.   PRESCRIPTION MEDICATION Inhale into the lungs at bedtime. CPAP   rivaroxaban  20 MG Tabs tablet Commonly known as: Xarelto  Take 1 tablet (20 mg total) by mouth daily with supper. Start  taking on: May 02, 2024   rosuvastatin  10 MG tablet Commonly known as: Crestor  Take 1 tablet (10 mg total) by mouth daily.   sildenafil  100 MG tablet Commonly known as: VIAGRA  Take 1 tablet (100 mg total) by mouth daily as needed for erectile dysfunction. Notes to patient: DO NOT TAKE NITROGLYCERIN  WITHIN 24 OF HOURS OF VIAGRA    Vitamin D  (Ergocalciferol ) 1.25 MG (50000 UNIT) Caps capsule Commonly known as: DRISDOL  Take 1 capsule (50,000 Units total) by mouth every 7 (seven) days.   Zepbound  7.5 MG/0.5ML Pen Generic drug: tirzepatide  Inject 7.5 mg into the skin once a week.           Allergies Allergies  Allergen Reactions   Dust Mite Extract    Statins     Muscle aches   Tree Extract Other (See Comments)    Intolerance Grass    Outstanding Labs/Studies   FLP/LFTs in 8 weeks   Duration of Discharge Encounter   Greater than 30 minutes including physician time.  Signed, Manuelita Rummer, NP 05/01/2024, 3:10 PM

## 2024-05-01 NOTE — Progress Notes (Signed)
 Pt was educated on stent card, stent location, Antiplatelet and ASA use, wt restrictions, no baths/daily wash-ups, s/s of infection, ex guidelines, s/s to stop exercising, NTG use and calling 911, heart healthy diet, risk factors and CRPII. Pt received materials on exercise, diet, and CRPII. Will refer to [facility].   Garen FORBES Candy MS, ACSM-CEP 05/01/2024 12:10 PM

## 2024-05-02 ENCOUNTER — Telehealth: Payer: Self-pay | Admitting: Internal Medicine

## 2024-05-02 ENCOUNTER — Encounter: Payer: Medicare (Managed Care) | Admitting: Rehabilitative and Restorative Service Providers"

## 2024-05-02 DIAGNOSIS — I25119 Atherosclerotic heart disease of native coronary artery with unspecified angina pectoris: Secondary | ICD-10-CM | POA: Diagnosis not present

## 2024-05-02 NOTE — Telephone Encounter (Signed)
 Called pt in regards to questions [facility] on 05/01/24.Pt expressed multiple concerns.   Reports feels lightheaded at times.  Has lost weight currently on Zepbound  and doesn't eat much.  Pt checked BP while on the phone after sitting for 3 min 125/75-70.  Advised pt to monitor BP at least once daily a couple hours after taking BP meds.  Keep a BP log for MD to review.  Also, advised pt could be r/t to stent opening up arteries.    Pt c/o arm pain.  Reports has not removed bandage.  Advised can remove bandage 24 hours after HC.  Pt reports thinks there maybe a blister under dressing.  Advised if there is a blister to leave it alone and let it dissolve on its own.   Pain to arm is 8-9:10 when picks something up goes down to 1-2 when puts object down.  Advised pt to ensure doesn't lift more than 5 lbs.  Keep arm elevated and take Tylenol  as needed.  Reports took tylenol  650 mg this AM.  Advised can take more tonight if needed.  Arm is warm to touch.   Reports nose felt wet inside when wiped noticed blood X1 this AM.  Not a lot of blood but wonders if he is taking to many medications.   Currently taking Xarelto , ASA 81 mg and clopidogrel .   Advised pt to continue to monitor, educated on how to treat bleeding if increases.  Advised will be on ASA for 2 weeks then will only take clopidogrel  and xarelto  per cath note.  Pt reports was told he would receive a Stent card at the hospital but was never given to him.    Pt is requesting to see Dr. Ladona or Dr. Waddell ASAP.  Advised pt concerns are not urgent at this time.  Will send to Dr. Ganji for recommendations.  I have spent 20 minutes on the phone with pt.

## 2024-05-02 NOTE — Telephone Encounter (Signed)
 Pt is requesting a callback regarding him having questions about procedure that was done and his arm being in a little pain. Also questions about the bandage. Please advise

## 2024-05-03 NOTE — Telephone Encounter (Signed)
 FYI

## 2024-05-03 NOTE — Telephone Encounter (Signed)
 I called and spoke to the patient.  I told him to hold the losartan  for the next 2 to 3 days.  He just felt weak and dizzy but his blood pressure is well-controlled around greater than 120 mmHg.  He needs follow-up office visit with one of us  post PCI, he would like that appointment sent by [employer].

## 2024-05-04 ENCOUNTER — Encounter: Payer: Self-pay | Admitting: Rehabilitative and Restorative Service Providers"

## 2024-05-04 ENCOUNTER — Telehealth (HOSPITAL_COMMUNITY): Payer: Self-pay

## 2024-05-04 ENCOUNTER — Ambulatory Visit (INDEPENDENT_AMBULATORY_CARE_PROVIDER_SITE_OTHER): Payer: Medicare (Managed Care) | Admitting: Rehabilitative and Restorative Service Providers"

## 2024-05-04 DIAGNOSIS — M6281 Muscle weakness (generalized): Secondary | ICD-10-CM

## 2024-05-04 DIAGNOSIS — R600 Localized edema: Secondary | ICD-10-CM | POA: Diagnosis not present

## 2024-05-04 DIAGNOSIS — R262 Difficulty in walking, not elsewhere classified: Secondary | ICD-10-CM

## 2024-05-04 DIAGNOSIS — M25562 Pain in left knee: Secondary | ICD-10-CM

## 2024-05-04 DIAGNOSIS — M25662 Stiffness of left knee, not elsewhere classified: Secondary | ICD-10-CM

## 2024-05-04 NOTE — Therapy (Signed)
 OUTPATIENT PHYSICAL THERAPY TREATMENT   Patient Name: AGUSTIN SWATEK MRN: [id number] DOB:Aug 22, 1956, 68 y.o., male Today's Date: 05/04/2024  END OF SESSION:  PT End of Session - 05/04/24 1046     Visit Number 12    Number of Visits 24    Date for PT Re-Evaluation 06/27/24    Authorization Type [employer] 20% coinsurance    Authorization - Number of Visits 33    Progress Note Due on Visit 20    PT Start Time 1056    PT Stop Time 1143    PT Time Calculation (min) 47 min    Activity Tolerance Patient tolerated treatment well    Behavior During Therapy WFL for tasks assessed/performed              Past Medical History:  Diagnosis Date   Arthritis    DDD (degenerative disc disease), lumbar    L3/L4   Diverticulosis    Dysrhythmia    SVT s/p ablation   Dysrhythmia    PAF   GERD (gastroesophageal reflux disease)    Hyperlipidemia    Low sperm motility    Myocardial infarction (HCC)    Pre-diabetes    Sleep apnea    uses cpap    SVT (supraventricular tachycardia) (HCC)    Past Surgical History:  Procedure Laterality Date   AMPUTATION Left 07/15/2018   Procedure: . REPAIR OF LEFT INDEX AND LONG FINGER BY FUSION /  REPAIR OF RING FINGER;  Surgeon: Sebastian Lenis, MD;  Location: [facility] OR;  Service: Orthopedics;  Laterality: Left;   BACK SURGERY     BLALOCK PROCEDURE     Ablation   CARDIAC CATHETERIZATION N/A 12/22/2015   Procedure: Left Heart Cath and Coronary Angiography;  Surgeon: Peter M Swaziland, MD;  Location: [facility] INVASIVE CV LAB;  Service: Cardiovascular;  Laterality: N/A;   CARDIAC ELECTROPHYSIOLOGY STUDY AND ABLATION     CORONARY STENT INTERVENTION N/A 05/01/2024   Procedure: CORONARY STENT INTERVENTION;  Surgeon: Ladona Heinz, MD;  Location: [facility] INVASIVE CV LAB;  Service: Cardiovascular;  Laterality: N/A;   ELECTROPHYSIOLOGIC STUDY N/A 02/19/2016   Procedure: SVT Ablation;  Surgeon: Danelle LELON Birmingham, MD;  Location: [facility] INVASIVE CV LAB;  Service: Cardiovascular;   Laterality: N/A;   HAND SURGERY     LEFT HEART CATH AND CORONARY ANGIOGRAPHY N/A 05/01/2024   Procedure: LEFT HEART CATH AND CORONARY ANGIOGRAPHY;  Surgeon: Ladona Heinz, MD;  Location: [facility] INVASIVE CV LAB;  Service: Cardiovascular;  Laterality: N/A;   septal reconstruction  2017   TOTAL KNEE ARTHROPLASTY Left 03/27/2024   Procedure: ARTHROPLASTY, KNEE, TOTAL, LEFT;  Surgeon: Addie Cordella Hamilton, MD;  Location: [facility] OR;  Service: Orthopedics;  Laterality: Left;   TURBINATE REDUCTION Bilateral 07/2016   WISDOM TOOTH EXTRACTION     Patient Active Problem List   Diagnosis Date Noted   CAD (coronary artery disease) 05/01/2024   Arthritis of left knee 04/15/2024   S/P TKR (total knee replacement), left 03/29/2024   S/P total knee arthroplasty, left 03/27/2024   Constipation 02/14/2024   Metabolic dysfunction-associated steatotic liver disease (MASLD) 01/13/2024   Abnormal food appetite 11/17/2023   Insulin  resistance 11/17/2023   Class 2 severe obesity due to excess calories with serious comorbidity and body mass index (BMI) of 35.0 to 35.9 in adult [facility]) 11/03/2023   Prediabetes 11/03/2023   Degenerative disc disease, lumbar 12/09/2022   Morbid obesity (HCC) 10/26/2022   Bronchitis 01/29/2022   OSA on CPAP 02/19/2021   Bite, snake  01/28/2021   Neck pain 11/13/2020   Vertigo 11/13/2020   Hypertension 11/03/2020   Contusion of bone 10/23/2020   Degenerative arthritis of knee, bilateral 10/23/2020   Concussion with no loss of consciousness 10/23/2020   Paroxysmal atrial fibrillation (HCC) 05/22/2019   Unspecified atrial flutter (HCC) 05/22/2019   Pain in right foot 03/30/2018   Perennial and seasonal allergic rhinitis 11/09/2017   History of food allergy 11/09/2017   Bronchitis, mucopurulent recurrent (HCC) 09/07/2017   SVT (supraventricular tachycardia) (HCC) 04/23/2015   Abdominal pain, epigastric 06/03/2013   Vitamin D  deficiency 04/09/2013   Esophageal reflux 04/09/2013   Hepatitis B  carrier (HCC) 04/09/2013   Other and unspecified hyperlipidemia 04/09/2013   Sleep apnea 04/09/2013    PCP: Dallas Maxwell, PA-C  REFERRING PROVIDER: Cordella Glendia Hutchinson, MD  REFERRING DIAG:  Diagnosis  R53.1 (ICD-10-CM) - Weakness    THERAPY DIAG:  Difficulty in walking, not elsewhere classified  Muscle weakness (generalized)  Localized edema  Acute pain of left knee  Knee stiffness, left  Rationale for Evaluation and Treatment: Rehabilitation  ONSET DATE: 03/27/2024  SUBJECTIVE:   SUBJECTIVE STATEMENT: Pt indicated having two stents put in around heart since last visit.  Pt indicated knee hasn't been doing too bad.    PERTINENT HISTORY: L3-4 lumbar DDD; pre-diabetes; previous back surgery; cardiac history  PAIN:  NPRS scale: No specific pain.  Pain location: Lt Knee Pain description: Ache, sore, stiff Aggravating factors: Sleeping  Relieving factors: Pain medications and ice  PRECAUTIONS: Back  RED FLAGS: None   WEIGHT BEARING RESTRICTIONS: No  FALLS:  Has patient fallen in last 6 months? No  LIVING ENVIRONMENT: Lives with: lives with their family, lives with their son, and lives with their daughter Lives in: House/apartment Stairs: Avoids stairs, walker and help for stairs now Has following equipment at home: Vannie - 2 wheeled  OCCUPATION: Retired  PLOF: Independent  PATIENT GOALS: Yard work, be more active   OBJECTIVE:  Note: Objective measures were completed at Evaluation unless otherwise noted.  DIAGNOSTIC FINDINGS: See chart  PATIENT SURVEYS:  PSFS: THE PATIENT SPECIFIC FUNCTIONAL SCALE  Place score of 0-10 (0 = unable to perform activity and 10 = able to perform activity at the same level as before injury or problem)  Activity Date: 04/04/2024 04/26/2024   Sleep 0 4   2.  Sitting 2 6   3.  Moving around 2 7   4.  Showering 1 10   Total Score 1.25  6.75     Total Score = Sum of activity scores/number of activities  Minimally  Detectable Change: 3 points (for single activity); 2 points (for average score)   COGNITION: 04/04/2024 Overall cognitive status: Within functional limits for tasks assessed     SENSATION: 04/04/2024 [facility]  EDEMA:  04/04/2024 Noted and not objectively assessed   LOWER EXTREMITY ROM:  Active ROM Left/Right 04/04/2024 Left  04/05/2024 Left 04/10/2024 04/13/24 04/17/24 Left 04/19/24 Left 04/26/2024  Knee flexion 62/145 82 with belt supine 100 Supine AROM heel slide P: 100 (guarding) Seated P: 112* Supine  A: 96* A: 114 Active 125  Knee extension -5/0 -3 supine -10 in seated LAQ AROM A: -7 (seated LAQ) LAQ A: -5* Standing A: -3* P: 0 Active lacks 3 degrees   (Blank rows = not tested)  LOWER EXTREMITY STRENGTH:  MMT Right  04/04/2024 Left 04/04/2024 Right 04/27/2024 Left 04/30/2024  Hip flexion      Hip extension      Hip abduction  Hip adduction      Hip internal rotation      Hip external rotation      Knee flexion      Knee extension 4+ 2+ 81.1 in pounds (236 body weight) 25.7 lbs  Ankle dorsiflexion      Ankle plantarflexion      Ankle inversion      Ankle eversion       (Blank rows = not tested)  GAIT: 05/04/2024: SPC use today in ambulation in clinic.   04/30/2024: Ambulation c FWW today with improve sequencing noted compared to [facility] in last visits.   04/04/2024 Distance walked: 50 feet Assistive device utilized: Environmental consultant - 2 wheeled Level of assistance: Modified independence Comments: Twain was assistive device free before surgery                    TREATMENT       DATE: 05/04/2024 Therex: Recumbent bike lvl 3 9 mins for ROM only, seat 7.  Slow RPM.  Seated Lt leg tailgate flexion swinging 1 min  Standing incline gastroc stretch 30 sec x 5  Supine Lt knee LAQ in 90 deg hip flexion x 15 Supine TKE stretch start of vaso to tolerance approx 3 mins.   Neuro  (balance control, muscle activation) Tandem stance on foam 1 min x 1 bilateral with occasional HHA Tandem  ambulation on foam in // bars with occasional HHA 6 ft x 5 each way with SBA Feet across airex foam bar for ankle strategy improvements 1 min  SLS with contralateral leg over and back 6 inch hurdle x 10 bilateral    Vaso: Lt knee 34 deg high compression 10 mins in elevation.  TKE stretch in supine to start      TREATMENT       DATE: 04/30/2024 Therex: Recumbent bike seat 7 lvl 5 10 mins with :10 second faster top of minute from 2 mins to 8.  Supine heel slide, quad set/SLR combo x 10 Lt leg  Supine TKE stretch start of vaso to tolerance approx 3 mins.  Education for continued use at home.   Neuro  Tandem stance on foam in // bars with occasional HHA 1 min x 2 bilateral  Step over and back 4 inch step with forward/back stepping with single hand on bar x 10 each LE    TherActivity Double Leg Press 125lbs  x 15 full extension and slow eccentrics into flexion Single Leg Press 50lbs  2 x 15 Lt full extension and slow eccentrics into flexion  Vaso: Lt knee 34 deg high compression 10 mins in elevation.  TKE stretch in supine to start     TREATMENT       DATE:  04/27/2024 Recumbent bike Seat 7 for 6 minutes Level 6, Fartlek training (:50 at a speed above 8 MPH followed by :10 of sprinting above 12 MPH) Seated straight leg raises 3 sets of 5 with 0# (cues to maintain knee extension throughout) Prone quadriceps stretch 5 x 20 seconds Prone knee extension stretch with 1# hanging from heel 2 minutes Seated knee flexion AAROM 10 x 10 seconds (right pushes left into flexion)  Functional Activities: Mini sit to stand in parallel bars 5 x  Neuromuscular Re-education: Tandem balance 4 x 20 seconds Single Leg Stance 4 x 10 seconds each side  Vaso Left knee High pressure 34 degrees 15 minutes   TREATMENT       DATE:04/26/2024 Recumbent bike Seat 7 for 5  minutes Level 4 Seated straight leg raises 3 sets of 5 with 0# Prone quadriceps stretch 5 x 20 seconds Prone knee extension stretch with 1#  hanging from heel Seated knee flexion AAROM 10 x 10 seconds (right pushes left into flexion)  Functional Activities: Double Leg Press 125# 10 x full extension and slow eccentrics into flexion Single Leg Press 50# 10 x left side only full extension and slow eccentrics into flexion  Vaso Left knee High pressure 34 degrees 15 minutes   TREATMENT       DATE:04/25/2024 Recumbent bike Seat 7 for 7 minutes Resistance Level 4-5 Seated straight leg raises 3 sets of 5 with 1#  Functional Activities: Double Leg Press 125# 12 x full extension and slow eccentrics into flexion Single Leg Press 50# 10 x left side only full extension and slow eccentrics into flexion  Neuromuscular re-education: Tandem balance 6 x 20 seconds Single leg stance 8 x 10 seconds  Vaso Left knee Medium pressure 34 degrees 10 minutes   PATIENT EDUCATION:  Eval:  Education details: See above Person educated: Patient and Child(ren) Education method: Explanation, Demonstration, Tactile cues, Verbal cues, and Handouts Education comprehension: verbalized understanding, returned demonstration, verbal cues required, tactile cues required, and needs further education  HOME EXERCISE PROGRAM: Access Code: [email or web address] URL: [email or web address] Date: 04/26/2024 Prepared by: Lamar Ivory  Exercises - Seated Knee Flexion AAROM  - 5 x daily - 7 x weekly - 1 sets - 10 reps - 10 seconds hold - Supine Quadricep Sets  - 5 x daily - 7 x weekly - 2 sets - 10 reps - 5 second hold - Small Range Straight Leg Raise  - 2 x daily - 7 x weekly - 3-5 sets - 5 reps - 3 seconds hold - Tandem Stance  - 1 x daily - 7 x weekly - 1 sets - 10 reps - 20 second hold - Single Leg Stance  - 1 x daily - 7 x weekly - 1 sets - 10 reps - 10 seconds hold - Prone Quadricep Stretch with Strap  - 2 x daily - 7 x weekly - 1 sets - 5-10 reps - 20 seconds hold - Prone Knee Extension with Ankle Weight  - 2 x daily - 7 x weekly - 1 sets - 1 reps - 3-5  minutes hold  ASSESSMENT:  CLINICAL IMPRESSION: Adjusted intervention today to avoid high heart demands for recovery from procedures this week.  Pt indicated they did not give him any specific restrictions following stents.     OBJECTIVE IMPAIRMENTS: Abnormal gait, decreased activity tolerance, decreased endurance, decreased knowledge of condition, difficulty walking, decreased ROM, decreased strength, increased edema, impaired perceived functional ability, and pain.   ACTIVITY LIMITATIONS: bending, sitting, standing, squatting, sleeping, stairs, transfers, bed mobility, dressing, and locomotion level  PARTICIPATION LIMITATIONS: driving, community activity, and yard work  PERSONAL FACTORS: L3-4 lumbar DDD; pre-diabetes; previous back surgery; cardiac history are also affecting patient's functional outcome.    REHAB POTENTIAL: Good  CLINICAL DECISION MAKING: Stable/uncomplicated  EVALUATION COMPLEXITY: Low   GOALS: Goals reviewed with patient? Yes  SHORT TERM GOALS: Target date: 05/16/2024 Ryelan will be independent with his day 1 home exercise program Baseline: Started 04/04/2024 Goal status: Met 04/25/2024  2.  Improve left knee active range of motion to 3 - 0 -100 degrees Baseline: 5 - 0 - 62 degrees Goal status: Met 04/25/2024  3.  Improve left quadriceps strength as assessed by MMT and functional testing Baseline: 2+/5 at  evaluation Goal status: Ongoing   04/27/2024  4.  Ryaan will be independent with a single-point cane for ambulation outside the home Baseline: Wheeled walker full-time Goal status: Met 04/25/2024   LONG TERM GOALS: Target date: 06/27/2024  Improve patient specific functional score to at least 5 Baseline: 0.125 Goal status: Met 04/26/2024  2.  Lestat will report left knee pain consistently 0-3/10 on the numeric pain rating scale Baseline: 10/10 Goal status: Ongoing   04/26/2024  3.  Improve left knee AROM to 2 - 0 - 110 degrees or better Baseline: 5 - 0 - 62  degrees Goal status: Partially Met 04/26/2024  4.  Improve left quadriceps strength as assessed by MMT and Arran's ability to ambulate without an assistive device 100% of the time Baseline: Wheeled walker full-time Goal status: Ongoing   04/26/2024  5.  Pier will be independent with his long-term maintenance home exercise program at discharge Baseline: Started 04/04/2024 Goal status: Ongoing   04/26/2024    PLAN:  PT FREQUENCY: 2-3x/week  PT DURATION: 12 weeks  PLANNED INTERVENTIONS: 97750- Physical Performance Testing, 97110-Therapeutic exercises, 97530- Therapeutic activity, 97112- Neuromuscular re-education, 97535- Self Care, [id number]- Manual therapy, [phone]- Gait training, [phone]- Vasopneumatic device, Patient/Family education, Balance training, Stair training, Joint mobilization, and Cryotherapy  PLAN FOR NEXT SESSION: Progressive strengthening return with mindful about heart.   NEXT MD VISIT: 05/14/2024  Ozell Silvan, PT, DPT, OCS, ATC 05/04/24  11:34 AM

## 2024-05-04 NOTE — Telephone Encounter (Signed)
 Faxed outside referral for Phase II Cardiac Rehab to [facility].

## 2024-05-08 ENCOUNTER — Encounter: Payer: Self-pay | Admitting: Rehabilitative and Restorative Service Providers"

## 2024-05-08 ENCOUNTER — Ambulatory Visit (INDEPENDENT_AMBULATORY_CARE_PROVIDER_SITE_OTHER): Payer: Medicare (Managed Care) | Admitting: Rehabilitative and Restorative Service Providers"

## 2024-05-08 DIAGNOSIS — R600 Localized edema: Secondary | ICD-10-CM

## 2024-05-08 DIAGNOSIS — M6281 Muscle weakness (generalized): Secondary | ICD-10-CM | POA: Diagnosis not present

## 2024-05-08 DIAGNOSIS — M25662 Stiffness of left knee, not elsewhere classified: Secondary | ICD-10-CM

## 2024-05-08 DIAGNOSIS — R262 Difficulty in walking, not elsewhere classified: Secondary | ICD-10-CM

## 2024-05-08 DIAGNOSIS — M25562 Pain in left knee: Secondary | ICD-10-CM | POA: Diagnosis not present

## 2024-05-08 NOTE — Therapy (Signed)
 OUTPATIENT PHYSICAL THERAPY TREATMENT   Patient Name: Richard Walker MRN: [id number] DOB:April 27, 1956, 68 y.o., male Today's Date: 05/08/2024  END OF SESSION:  PT End of Session - 05/08/24 1414     Visit Number 13    Number of Visits 24    Date for PT Re-Evaluation 06/27/24    Authorization Type [employer] 20% coinsurance    Authorization - Number of Visits 33    Progress Note Due on Visit 20    PT Start Time 1425    PT Stop Time 1511    PT Time Calculation (min) 46 min    Activity Tolerance Patient tolerated treatment well    Behavior During Therapy WFL for tasks assessed/performed               Past Medical History:  Diagnosis Date   Arthritis    DDD (degenerative disc disease), lumbar    L3/L4   Diverticulosis    Dysrhythmia    SVT s/p ablation   Dysrhythmia    PAF   GERD (gastroesophageal reflux disease)    Hyperlipidemia    Low sperm motility    Myocardial infarction (HCC)    Pre-diabetes    Sleep apnea    uses cpap    SVT (supraventricular tachycardia) (HCC)    Past Surgical History:  Procedure Laterality Date   AMPUTATION Left 07/15/2018   Procedure: . REPAIR OF LEFT INDEX AND LONG FINGER BY FUSION /  REPAIR OF RING FINGER;  Surgeon: Sebastian Lenis, MD;  Location[facility] OR;  Service: Orthopedics;  Laterality: Left;   BACK SURGERY     BLALOCK PROCEDURE     Ablation   CARDIAC CATHETERIZATION N/A 12/22/2015   Procedure: Left Heart Cath and Coronary Angiography;  Surgeon: Peter M Swaziland, MD;  Location: [facility] INVASIVE CV LAB;  Service: Cardiovascular;  Laterality: N/A;   CARDIAC ELECTROPHYSIOLOGY STUDY AND ABLATION     CORONARY STENT INTERVENTION N/A 05/01/2024   Procedure: CORONARY STENT INTERVENTION;  Surgeon: Ladona Heinz, MD;  Location: [facility] INVASIVE CV LAB;  Service: Cardiovascular;  Laterality: N/A;   ELECTROPHYSIOLOGIC STUDY N/A 02/19/2016   Procedure: SVT Ablation;  Surgeon: Danelle LELON Birmingham, MD;  Location: [facility] INVASIVE CV LAB;  Service: Cardiovascular;   Laterality: N/A;   HAND SURGERY     LEFT HEART CATH AND CORONARY ANGIOGRAPHY N/A 05/01/2024   Procedure: LEFT HEART CATH AND CORONARY ANGIOGRAPHY;  Surgeon: Ladona Heinz, MD;  Location: [facility] INVASIVE CV LAB;  Service: Cardiovascular;  Laterality: N/A;   septal reconstruction  2017   TOTAL KNEE ARTHROPLASTY Left 03/27/2024   Procedure: ARTHROPLASTY, KNEE, TOTAL, LEFT;  Surgeon: Addie Cordella Hamilton, MD;  Location: [facility] OR;  Service: Orthopedics;  Laterality: Left;   TURBINATE REDUCTION Bilateral 07/2016   WISDOM TOOTH EXTRACTION     Patient Active Problem List   Diagnosis Date Noted   CAD (coronary artery disease) 05/01/2024   Arthritis of left knee 04/15/2024   S/P TKR (total knee replacement), left 03/29/2024   S/P total knee arthroplasty, left 03/27/2024   Constipation 02/14/2024   Metabolic dysfunction-associated steatotic liver disease (MASLD) 01/13/2024   Abnormal food appetite 11/17/2023   Insulin  resistance 11/17/2023   Class 2 severe obesity due to excess calories with serious comorbidity and body mass index (BMI) of 35.0 to 35.9 in adult [facility]) 11/03/2023   Prediabetes 11/03/2023   Degenerative disc disease, lumbar 12/09/2022   Morbid obesity (HCC) 10/26/2022   Bronchitis 01/29/2022   OSA on CPAP 02/19/2021   Bite,  snake 01/28/2021   Neck pain 11/13/2020   Vertigo 11/13/2020   Hypertension 11/03/2020   Contusion of bone 10/23/2020   Degenerative arthritis of knee, bilateral 10/23/2020   Concussion with no loss of consciousness 10/23/2020   Paroxysmal atrial fibrillation (HCC) 05/22/2019   Unspecified atrial flutter (HCC) 05/22/2019   Pain in right foot 03/30/2018   Perennial and seasonal allergic rhinitis 11/09/2017   History of food allergy 11/09/2017   Bronchitis, mucopurulent recurrent (HCC) 09/07/2017   SVT (supraventricular tachycardia) (HCC) 04/23/2015   Abdominal pain, epigastric 06/03/2013   Vitamin D  deficiency 04/09/2013   Esophageal reflux 04/09/2013   Hepatitis B  carrier (HCC) 04/09/2013   Other and unspecified hyperlipidemia 04/09/2013   Sleep apnea 04/09/2013    PCP: Dallas Maxwell, PA-C  REFERRING PROVIDER: Cordella Glendia Hutchinson, MD  REFERRING DIAG:  Diagnosis  R53.1 (ICD-10-CM) - Weakness    THERAPY DIAG:  Difficulty in walking, not elsewhere classified  Muscle weakness (generalized)  Localized edema  Acute pain of left knee  Knee stiffness, left  Rationale for Evaluation and Treatment: Rehabilitation  ONSET DATE: 03/27/2024  SUBJECTIVE:   SUBJECTIVE STATEMENT: Pt indicated having two stents put in around heart since last visit.  Pt indicated knee hasn't been doing too bad.    PERTINENT HISTORY: L3-4 lumbar DDD; pre-diabetes; previous back surgery; cardiac history  PAIN:  NPRS scale: No specific pain.  Pain location: Lt Knee Pain description: Ache, sore, stiff Aggravating factors: Sleeping  Relieving factors: Pain medications and ice  PRECAUTIONS: Back  RED FLAGS: None   WEIGHT BEARING RESTRICTIONS: No  FALLS:  Has patient fallen in last 6 months? No  LIVING ENVIRONMENT: Lives with: lives with their family, lives with their son, and lives with their daughter Lives in: House/apartment Stairs: Avoids stairs, walker and help for stairs now Has following equipment at home: Vannie - 2 wheeled  OCCUPATION: Retired  PLOF: Independent  PATIENT GOALS: Yard work, be more active   OBJECTIVE:  Note: Objective measures were completed at Evaluation unless otherwise noted.  DIAGNOSTIC FINDINGS: See chart  PATIENT SURVEYS:  PSFS: THE PATIENT SPECIFIC FUNCTIONAL SCALE  Place score of 0-10 (0 = unable to perform activity and 10 = able to perform activity at the same level as before injury or problem)  Activity Date: 04/04/2024 04/26/2024   Sleep 0 4   2.  Sitting 2 6   3.  Moving around 2 7   4.  Showering 1 10   Total Score 1.25  6.75     Total Score = Sum of activity scores/number of activities  Minimally  Detectable Change: 3 points (for single activity); 2 points (for average score)   COGNITION: 04/04/2024 Overall cognitive status: Within functional limits for tasks assessed     SENSATION: 04/04/2024 [facility]  EDEMA:  04/04/2024 Noted and not objectively assessed   LOWER EXTREMITY ROM:  Active ROM Left/Right 04/04/2024 Left  04/05/2024 Left 04/10/2024 04/13/24 04/17/24 Left 04/19/24 Left 04/26/2024  Knee flexion 62/145 82 with belt supine 100 Supine AROM heel slide P: 100 (guarding) Seated P: 112* Supine  A: 96* A: 114 Active 125  Knee extension -5/0 -3 supine -10 in seated LAQ AROM A: -7 (seated LAQ) LAQ A: -5* Standing A: -3* P: 0 Active lacks 3 degrees   (Blank rows = not tested)  LOWER EXTREMITY STRENGTH:  MMT Right  04/04/2024 Left 04/04/2024 Right 04/27/2024 Left 04/30/2024 Left 05/08/2024  Hip flexion       Hip extension  Hip abduction       Hip adduction       Hip internal rotation       Hip external rotation       Knee flexion       Knee extension 4+ 2+ 81.1 in pounds (236 body weight) 25.7 lbs 31,.9, 28.4 4+/5  Ankle dorsiflexion       Ankle plantarflexion       Ankle inversion       Ankle eversion        (Blank rows = not tested)  GAIT: 05/04/2024: SPC use today in ambulation in clinic.   04/30/2024: Ambulation c FWW today with improve sequencing noted compared to [facility] in last visits.   04/04/2024 Distance walked: 50 feet Assistive device utilized: Environmental consultant - 2 wheeled Level of assistance: Modified independence Comments: [place] was assistive device free before surgery                    TREATMENT       DATE: 05/08/2024 Therex: Recumbent bike lvl 3 9 mins for ROM only, seat 6.   Standing incline gastroc stretch 30 sec x 3 Seated tailgate flexion 1 min Seated Lt knee 4 lbs 2 x 15 LAQ Seated quad set with SLR x 15 slow on Lt leg   Supine TKE stretch start of vaso to tolerance approx 3 mins.   TherActivity (to improve stairs, squatting, transfers Leg  press 75 lbs slow lowering double leg x 15  (kept weight lower due to recent stents procedure) Leg press single leg 37 lbs x 15  , 31 lbs x 15 (kept weight lower due to recent stents procedure)  Vaso: Lt knee 34 deg high compression 10 mins in elevation.  TKE stretch in supine to start    TREATMENT       DATE: 05/04/2024 Therex: Recumbent bike lvl 3 9 mins for ROM only, seat 7.  Slow RPM.  Seated Lt leg tailgate flexion swinging 1 min  Standing incline gastroc stretch 30 sec x 5  Supine Lt knee LAQ in 90 deg hip flexion x 15 Supine TKE stretch start of vaso to tolerance approx 3 mins.   Neuro  (balance control, muscle activation) Tandem stance on foam 1 min x 1 bilateral with occasional HHA Tandem ambulation on foam in // bars with occasional HHA 6 ft x 5 each way with SBA Feet across airex foam bar for ankle strategy improvements 1 min  SLS with contralateral leg over and back 6 inch hurdle x 10 bilateral    Vaso: Lt knee 34 deg high compression 10 mins in elevation.  TKE stretch in supine to start    TREATMENT       DATE: 04/30/2024 Therex: Recumbent bike seat 7 lvl 5 10 mins with :10 second faster top of minute from 2 mins to 8.  Supine heel slide, quad set/SLR combo x 10 Lt leg  Supine TKE stretch start of vaso to tolerance approx 3 mins.  Education for continued use at home.   Neuro  Tandem stance on foam in // bars with occasional HHA 1 min x 2 bilateral  Step over and back 4 inch step with forward/back stepping with single hand on bar x 10 each LE    TherActivity Double Leg Press 125lbs  x 15 full extension and slow eccentrics into flexion Single Leg Press 50lbs  2 x 15 Lt full extension and slow eccentrics into flexion  Vaso: Lt knee 34  deg high compression 10 mins in elevation.  TKE stretch in supine to start    PATIENT EDUCATION:  Eval:  Education details: See above Person educated: Patient and Child(ren) Education method: Explanation, Demonstration, Tactile  cues, Verbal cues, and Handouts Education comprehension: verbalized understanding, returned demonstration, verbal cues required, tactile cues required, and needs further education  HOME EXERCISE PROGRAM: Access Code: [email or web address] URL: [email or web address] Date: 04/26/2024 Prepared by: Lamar Ivory  Exercises - Seated Knee Flexion AAROM  - 5 x daily - 7 x weekly - 1 sets - 10 reps - 10 seconds hold - Supine Quadricep Sets  - 5 x daily - 7 x weekly - 2 sets - 10 reps - 5 second hold - Small Range Straight Leg Raise  - 2 x daily - 7 x weekly - 3-5 sets - 5 reps - 3 seconds hold - Tandem Stance  - 1 x daily - 7 x weekly - 1 sets - 10 reps - 20 second hold - Single Leg Stance  - 1 x daily - 7 x weekly - 1 sets - 10 reps - 10 seconds hold - Prone Quadricep Stretch with Strap  - 2 x daily - 7 x weekly - 1 sets - 5-10 reps - 20 seconds hold - Prone Knee Extension with Ankle Weight  - 2 x daily - 7 x weekly - 1 sets - 1 reps - 3-5 minutes hold  ASSESSMENT:  CLINICAL IMPRESSION: Strength testing improved some compared to last assessment but continued strengthening indicated. Seated SLR showed extension lag today.  Revewied use at home for improvements.  Continued skilled PT services indicated at this time.    OBJECTIVE IMPAIRMENTS: Abnormal gait, decreased activity tolerance, decreased endurance, decreased knowledge of condition, difficulty walking, decreased ROM, decreased strength, increased edema, impaired perceived functional ability, and pain.   ACTIVITY LIMITATIONS: bending, sitting, standing, squatting, sleeping, stairs, transfers, bed mobility, dressing, and locomotion level  PARTICIPATION LIMITATIONS: driving, community activity, and yard work  PERSONAL FACTORS: L3-4 lumbar DDD; pre-diabetes; previous back surgery; cardiac history are also affecting patient's functional outcome.    REHAB POTENTIAL: Good  CLINICAL DECISION MAKING: Stable/uncomplicated  EVALUATION  COMPLEXITY: Low   GOALS: Goals reviewed with patient? Yes  SHORT TERM GOALS: Target date: 05/16/2024 Arek will be independent with his day 1 home exercise program Baseline: Started 04/04/2024 Goal status: Met 04/25/2024  2.  Improve left knee active range of motion to 3 - 0 -100 degrees Baseline: 5 - 0 - 62 degrees Goal status: Met 04/25/2024  3.  Improve left quadriceps strength as assessed by MMT and functional testing Baseline: 2+/5 at evaluation Goal status: Ongoing   04/27/2024  4.  Arch will be independent with a single-point cane for ambulation outside the home Baseline: Wheeled walker full-time Goal status: Met 04/25/2024   LONG TERM GOALS: Target date: 06/27/2024  Improve patient specific functional score to at least 5 Baseline: 0.125 Goal status: Met 04/26/2024  2.  Radford will report left knee pain consistently 0-3/10 on the numeric pain rating scale Baseline: 10/10 Goal status: Ongoing   04/26/2024  3.  Improve left knee AROM to 2 - 0 - 110 degrees or better Baseline: 5 - 0 - 62 degrees Goal status: Partially Met 04/26/2024  4.  Improve left quadriceps strength as assessed by MMT and Rodriquez's ability to ambulate without an assistive device 100% of the time Baseline: Wheeled walker full-time Goal status: Ongoing   04/26/2024  5.  Burdett will be independent with his  long-term maintenance home exercise program at discharge Baseline: Started 04/04/2024 Goal status: Ongoing   04/26/2024    PLAN:  PT FREQUENCY: 2-3x/week  PT DURATION: 12 weeks  PLANNED INTERVENTIONS: 97750- Physical Performance Testing, 97110-Therapeutic exercises, 97530- Therapeutic activity, 97112- Neuromuscular re-education, 97535- Self Care, [id number]- Manual therapy, [phone]- Gait training, [phone]- Vasopneumatic device, Patient/Family education, Balance training, Stair training, Joint mobilization, and Cryotherapy  PLAN FOR NEXT SESSION: Continue to improve WB strength control.   NEXT MD VISIT: 05/14/2024  Ozell Silvan, PT, DPT, OCS, ATC 05/08/24  2:56 PM

## 2024-05-09 ENCOUNTER — Encounter (INDEPENDENT_AMBULATORY_CARE_PROVIDER_SITE_OTHER): Payer: Self-pay | Admitting: Internal Medicine

## 2024-05-09 ENCOUNTER — Ambulatory Visit (INDEPENDENT_AMBULATORY_CARE_PROVIDER_SITE_OTHER): Payer: Medicare (Managed Care) | Admitting: Internal Medicine

## 2024-05-09 VITALS — BP 124/80 | HR 65 | Temp 97.9°F | Ht 68.0 in | Wt 226.0 lb

## 2024-05-09 DIAGNOSIS — G4733 Obstructive sleep apnea (adult) (pediatric): Secondary | ICD-10-CM

## 2024-05-09 DIAGNOSIS — Z6835 Body mass index (BMI) 35.0-35.9, adult: Secondary | ICD-10-CM

## 2024-05-09 DIAGNOSIS — E66812 Obesity, class 2: Secondary | ICD-10-CM

## 2024-05-09 DIAGNOSIS — I10 Essential (primary) hypertension: Secondary | ICD-10-CM | POA: Diagnosis not present

## 2024-05-09 DIAGNOSIS — R7303 Prediabetes: Secondary | ICD-10-CM

## 2024-05-09 DIAGNOSIS — I2510 Atherosclerotic heart disease of native coronary artery without angina pectoris: Secondary | ICD-10-CM

## 2024-05-09 DIAGNOSIS — K760 Fatty (change of) liver, not elsewhere classified: Secondary | ICD-10-CM | POA: Diagnosis not present

## 2024-05-09 DIAGNOSIS — R638 Other symptoms and signs concerning food and fluid intake: Secondary | ICD-10-CM

## 2024-05-09 DIAGNOSIS — Z79891 Long term (current) use of opiate analgesic: Secondary | ICD-10-CM | POA: Insufficient documentation

## 2024-05-09 MED ORDER — ZEPBOUND 7.5 MG/0.5ML ~~LOC~~ SOAJ: 7.5000 mg | SUBCUTANEOUS | 0 refills | Status: DC

## 2024-05-09 NOTE — Assessment & Plan Note (Signed)
 He has hepatic steatosis on CT scan completed in January of this year.  Fibrosis 4 Score = 1.3  He does not drink alcohol and is not on steatogenic medication.  Losing 15% of body weight may improve condition.  He is also working on reducing saturated fats simple and processed sugars in his diet and is engaging in physical activity.  Continue current weight management strategy inclusive of GLP-1

## 2024-05-09 NOTE — Assessment & Plan Note (Signed)
 Counseled on the risk of long-term use of opiates this is likely contributing to his fatigue and loss of appetite.  He has limited pain options because of risk of bleeding advised to take some Tylenol  in place of oxycodone  as needed.  He will follow-up with orthopedic surgeon as scheduled

## 2024-05-09 NOTE — Assessment & Plan Note (Signed)
 Improved on GLP-1 monitor for muscle loss consider reduction in medication if unable to keep up with nutritional needs

## 2024-05-09 NOTE — Assessment & Plan Note (Signed)
 Blood pressure is at goal he is on metoprolol  and ARB.  Continue current regimen

## 2024-05-09 NOTE — Assessment & Plan Note (Signed)
 Obesity with rapid weight loss and sarcopenia He has experienced a rapid weight loss of 15 pounds since the last visit, with a loss of 2-3 pounds of muscle mass. He is following a [place] diet targeting approximately 1500 calories but adheres to it only 50% of the time. He is currently on Zepbound  7.5 mg weekly. The rapid weight loss is concerning for potential sarcopenia and inadequate caloric and protein intake, which could affect muscle mass and overall health. He reports decreased appetite, possibly due to the medication and pain management regimen. There is a risk of further muscle loss and weakness if the current rate of weight loss continues. - Maintain current dose of Zepbound  at 7.5 mg weekly - Encourage consumption of at least three meals per day to ensure adequate caloric and protein intake - Consider reducing Zepbound  dose if appetite does not improve and weight loss continues at the current rate - Reassess weight and muscle mass in four weeks

## 2024-05-09 NOTE — Assessment & Plan Note (Signed)
 I reviewed most recent heart catheterization.  He is now on aspirin  Plavix  and Xarelto  and therefore is at increased risk of bleeding.  Patient counseled on monitoring for bleeding avoiding trauma.  Continue dual antiplatelet therapy for specified duration by cardiologist he is also on Xarelto .  Continue ARB, beta-blocker fenofibrate  and rosuvastatin .  Blood pressure is adequately controlled

## 2024-05-09 NOTE — Progress Notes (Signed)
 Office: [phone]  /  Fax: [phone]  Weight Summary and Body Composition Analysis (BIA)  Vitals Temp: 97.9 F (36.6 C) BP: 124/80 Pulse Rate: 65 SpO2: 98 %   Anthropometric Measurements Height: 5' 8 (1.727 m) Weight: 226 lb (102.5 kg) BMI (Calculated): 34.37 Weight at Last Visit: 241 lb Weight Lost Since Last Visit: 15 lb Weight Gained Since Last Visit: 0 lb Starting Weight: 264 lb Total Weight Loss (lbs): 38 lb (17.2 kg) Peak Weight: 270 lb   Body Composition  Body Fat %: 33.3 % Fat Mass (lbs): 75.4 lbs Muscle Mass (lbs): 143.8 lbs Total Body Water  (lbs): 109.2 lbs Visceral Fat Rating : 20    RMR: 2405  Today's Visit #: 8  Starting Date: 11/03/23   Subjective   Chief Complaint: Obesity  Interval History Discussed the use of AI scribe software for clinical note transcription with the patient, who gave verbal consent to proceed.  History of Present Illness Richard Walker is a 68 year old male with atrial fibrillation, hypertension, coronary artery disease, and sleep apnea who presents for medical weight management.  He has lost fifteen pounds since his last visit and is following a [place] diet targeting approximately 1500 calories, adhering to his meal plan about 50% of the time. He often skips meals, sometimes consuming only two meals or a meal and a snack per day. He supplements with protein shakes but notes a decreased appetite. He is currently on Zepbound  7.5 mg once a week for weight management.  He has a history of atrial fibrillation, hypertension, coronary artery disease, sleep apnea managed with CPAP, MASLD, insulin  resistance, and prediabetes. In June, he underwent a stress test which was abnormal, leading to a cardiac catheterization where two stents were placed due to 100% blockage in two arteries and a third artery being halfway blocked. Post-procedure, he feels worse with decreased energy and increased sleepiness. He is on multiple  medications including aspirin , Xarelto , and Plavix  for his heart condition.  He is status post total knee replacement and reports ongoing knee pain, though flexibility has improved. He is undergoing physical therapy three times a week for 30-45 minutes and is assigned additional exercises at home. He takes oxycodone  for pain management, which he believes affects his appetite. He previously took gabapentin  but discontinued it due to muscle cramps.  He reports sleep disturbances, sleeping only one to two hours at a time, which he attributes to knee pain. He is concerned about the impact of his medications on his overall well-being, noting that he feels worse post-surgery and is taking more medications than before.     Challenges affecting patient progress: Having problems with fatigue and low levels of energy.    Pharmacotherapy for weight management: He is currently taking Zepbound  with adequate clinical response  and without side effects..   Assessment and Plan   Treatment Plan For Obesity:  Recommended Dietary Goals  Karma is currently in the action stage of change. As such, his goal is to continue weight management plan. He has agreed to: continue current plan and patient advised to increase meal intake and calories if he can keep up with his nutrition we may have to reduce GLP-1.  Behavioral Health and Counseling  We discussed the following behavioral modification strategies today: increasing lean protein intake to established goals, avoiding skipping meals, and work on meal planning and preparation.  Additional education and resources provided today: None  Recommended Physical Activity Goals  [name] has been advised to  work up to 150 minutes of moderate intensity aerobic activity a week and strengthening exercises 2-3 times per week for cardiovascular health, weight loss maintenance and preservation of muscle mass.  He has agreed to :  Continue to gradually increase the amount and  intensity of exercise routine  Medical Interventions and Pharmacotherapy  We discussed various medication options to help Ivis with his weight loss efforts and we both agreed to : He will continue on current dose of Zepbound  if he is unable to maintain adequate nutrition and continues to lose weight rapidly we will reduce medication to 5 mg at the next office visit  Associated Conditions Impacted by Obesity Treatment  Assessment & Plan OSA on CPAP Reviewed sleep study from July 2022 he had an AHI of 50 with oxygen desaturations he is currently on PAP therapy but still has an elevated Epworth around 13 despite adjustments in pressures.  I feel that patient will benefit from weight loss aided by GLP-1 to reduce AHI and symptoms related to sleep disordered breathing.  Continue Zepbound  at current dose Primary hypertension Blood pressure is at goal he is on metoprolol  and ARB.  Continue current regimen Metabolic dysfunction-associated steatotic liver disease (MASLD) He has hepatic steatosis on CT scan completed in January of this year.  Fibrosis 4 Score = 1.3  He does not drink alcohol and is not on steatogenic medication.  Losing 15% of body weight may improve condition.  He is also working on reducing saturated fats simple and processed sugars in his diet and is engaging in physical activity.  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 He is following a [place] diet  targeting approximately 1500 calories but adheres to it only 50% of the time. He is currently on Zepbound  7.5 mg weekly. The rapid weight loss is concerning for potential sarcopenia and inadequate caloric and protein intake, which could affect muscle mass and overall health. He reports decreased appetite, possibly due to the medication and pain management regimen. There is a risk of further muscle loss and weakness if the current rate of weight loss continues. - Maintain current dose of Zepbound  at 7.5 mg weekly - Encourage consumption of at least three meals per day to ensure adequate caloric and protein intake - Consider reducing Zepbound  dose if appetite does not improve and weight loss continues at the current rate - Reassess weight and muscle mass in four weeks Coronary artery disease involving native coronary artery of native heart, unspecified whether angina present I reviewed most recent heart catheterization.  He is now on aspirin  Plavix  and Xarelto  and therefore is at increased risk of bleeding.  Patient counseled on monitoring for bleeding avoiding trauma.  Continue dual antiplatelet therapy for specified duration by cardiologist he is also on Xarelto .  Continue ARB, beta-blocker fenofibrate  and rosuvastatin .  Blood pressure is adequately controlled Use of opiates for therapeutic purposes Counseled on the risk of long-term use of opiates this is likely contributing to his fatigue and loss of appetite.  He  has limited pain options because of risk of bleeding advised to take some Tylenol  in place of oxycodone  as needed.  He will follow-up with orthopedic surgeon as scheduled         Objective   Physical Exam:  Blood pressure 124/80, pulse 65, temperature 97.9 F (36.6 C), height 5' 8 (1.727 m), weight 226 lb (102.5 kg), SpO2 98%. Body mass index is 34.36 kg/m.  General: He is overweight, cooperative, alert, well developed, and in no acute distress. PSYCH: Has normal mood, affect  and thought process.   HEENT: EOMI, sclerae are anicteric. Lungs: Normal breathing effort, no conversational dyspnea. Extremities: No edema.  Neurologic: No gross sensory or motor deficits. No tremors or fasciculations noted.    Diagnostic Data Reviewed:  BMET    Component Value Date/Time   NA 138 04/26/2024 1232   K 5.0 04/26/2024 1232   CL 99 04/26/2024 1232   CO2 24 04/26/2024 1232   GLUCOSE 87 04/26/2024 1232   GLUCOSE 98 03/20/2024 1450   BUN 14 04/26/2024 1232   CREATININE 1.20 04/26/2024 1232   CREATININE 1.18 02/12/2016 1508   CALCIUM  10.0 04/26/2024 1232   GFRNONAA >60 03/20/2024 1450   GFRNONAA >89 03/04/2014 1456   GFRAA >60 07/15/2018 1324   GFRAA >89 03/04/2014 1456   Lab Results  Component Value Date   HGBA1C 5.9 01/18/2024   HGBA1C 5.7 12/24/2015   Lab Results  Component Value Date   INSULIN  25.3 (H) 11/03/2023   Lab Results  Component Value Date   TSH 1.81 01/20/2024   CBC    Component Value Date/Time   WBC 6.3 04/26/2024 1232   WBC 6.7 03/20/2024 1450   RBC 5.21 04/26/2024 1232   RBC 5.67 03/20/2024 1450   HGB 14.5 04/26/2024 1232   HCT 45.2 04/26/2024 1232   PLT 239 04/26/2024 1232   MCV 87 04/26/2024 1232   MCH 27.8 04/26/2024 1232   MCH 28.6 03/20/2024 1450   MCHC 32.1 04/26/2024 1232   MCHC 33.3 03/20/2024 1450   RDW 13.4 04/26/2024 1232   Iron Studies    Component Value Date/Time   IRON 69 06/28/2018 1611   Lipid Panel     Component Value Date/Time   CHOL 209 (H) 01/18/2024 0935   TRIG 212.0 (H) 01/18/2024 0935   HDL 42.40 01/18/2024 0935   CHOLHDL 5 01/18/2024 0935   VLDL 42.4 (H) 01/18/2024 0935   LDLCALC 124 (H) 01/18/2024 0935   LDLDIRECT 150.0 03/10/2023 0951   Hepatic Function Panel     Component Value Date/Time   PROT 6.9 01/18/2024 0935   PROT 6.9 11/03/2023 1117   ALBUMIN 4.3 01/18/2024 0935   ALBUMIN 4.3 11/03/2023 1117   AST 16 01/18/2024 0935   ALT 23 01/18/2024 0935   ALKPHOS 83 01/18/2024 0935    BILITOT 0.4 01/18/2024 0935   BILITOT 0.3 11/03/2023 1117   BILIDIR 0.1 01/06/2017 1600      Component Value Date/Time   TSH 1.81 01/20/2024 1057   Nutritional Lab Results  Component Value Date   VD25OH 55.49 01/20/2024   VD25OH 35.78 09/12/2023   VD25OH 24.68 (L) 03/10/2023    Medications: Outpatient Encounter Medications as of 05/09/2024  Medication Sig Note   albuterol  (PROAIR  HFA) 108 (90 Base) MCG/ACT inhaler Inhale 1-2 puffs into the lungs every 6 (six) hours as needed for wheezing or shortness of breath.    aspirin  EC 81 MG tablet Take 1 tablet (81 mg total) by mouth daily. Swallow whole.  azelastine  (ASTELIN ) 0.1 % nasal spray 1-2 sprays per nostril 2 times daily as needed.    Budeson-Glycopyrrol-Formoterol  (BREZTRI  AEROSPHERE) 160-9-4.8 MCG/ACT AERO Inhale 2 puffs into the lungs in the morning and at bedtime.    cholecalciferol  (VITAMIN D3) 25 MCG (1000 UNIT) tablet Take 4,000 Units by mouth daily.    clopidogrel  (PLAVIX ) 75 MG tablet Take 1 tablet (75 mg total) by mouth daily.    docusate sodium  (COLACE) 100 MG capsule Take 1 capsule (100 mg total) by mouth 2 (two) times daily. 04/30/2024: For after procedure   ezetimibe  (ZETIA ) 10 MG tablet Take 1 tablet (10 mg total) by mouth daily.    fenofibrate  (TRICOR ) 145 MG tablet Take 1 tablet (145 mg total) by mouth daily.    gabapentin  (NEURONTIN ) 100 MG capsule Take 1 capsule (100 mg total) by mouth 2 (two) times daily. 04/30/2024: For after procedure    losartan  (COZAAR ) 25 MG tablet Take 1 tablet (25 mg total) by mouth daily.    Magnesium  250 MG TABS Take 250 mg by mouth once a week.    metFORMIN  (GLUCOPHAGE ) 500 MG tablet TAKE 1 TABLET(500 MG) BY MOUTH TWICE DAILY WITH A MEAL    methocarbamol  (ROBAXIN ) 500 MG tablet Take 1 tablet (500 mg total) by mouth every 8 (eight) hours as needed for muscle spasms.    metoprolol  tartrate (LOPRESSOR ) 25 MG tablet Take one tablet by mouth twice a day.  May take one additional tablet by mouth  as needed for breakthrough palpitations.    nitroGLYCERIN  (NITROSTAT ) 0.4 MG SL tablet Place 1 tablet (0.4 mg total) under the tongue every 5 (five) minutes as needed for up to 25 days for chest pain. Do not use for 24 hours after use of Viagra     oxyCODONE  (ROXICODONE ) 5 MG immediate release tablet Take 1 tablet (5 mg total) by mouth every 4 (four) hours as needed for severe pain (pain score 7-10).    pantoprazole  (PROTONIX ) 40 MG tablet Take 1 tablet (40 mg total) by mouth daily.    PRESCRIPTION MEDICATION Inhale into the lungs at bedtime. CPAP    rivaroxaban  (XARELTO ) 20 MG TABS tablet Take 1 tablet (20 mg total) by mouth daily with supper.    rosuvastatin  (CRESTOR ) 10 MG tablet Take 1 tablet (10 mg total) by mouth daily.    sildenafil  (VIAGRA ) 100 MG tablet Take 1 tablet (100 mg total) by mouth daily as needed for erectile dysfunction.    Vitamin D , Ergocalciferol , (DRISDOL ) 1.25 MG (50000 UNIT) CAPS capsule Take 1 capsule (50,000 Units total) by mouth every 7 (seven) days.    [DISCONTINUED] tirzepatide  (ZEPBOUND ) 7.5 MG/0.5ML Pen Inject 7.5 mg into the skin once a week.    tirzepatide  (ZEPBOUND ) 7.5 MG/0.5ML Pen Inject 7.5 mg into the skin once a week.    No facility-administered encounter medications on file as of 05/09/2024.     Follow-Up   No follow-ups on file.SABRA He was informed of the importance of frequent follow up visits to maximize his success with intensive lifestyle modifications for his multiple health conditions.  Attestation Statement   Reviewed by clinician on day of visit: allergies, medications, problem list, medical history, surgical history, family history, social history, and previous encounter notes.     Lucas Parker, MD

## 2024-05-09 NOTE — Assessment & Plan Note (Signed)
 Reviewed sleep study from July 2022 he had an AHI of 50 with oxygen desaturations he is currently on PAP therapy but still has an elevated Epworth around 13 despite adjustments in pressures.  I feel that patient will benefit from weight loss aided by GLP-1 to reduce AHI and symptoms related to sleep disordered breathing.  Continue Zepbound  at current dose

## 2024-05-09 NOTE — Assessment & Plan Note (Signed)
 On Zepbound  for pharmacoprophylaxis continue medication

## 2024-05-10 ENCOUNTER — Ambulatory Visit (INDEPENDENT_AMBULATORY_CARE_PROVIDER_SITE_OTHER): Payer: Medicare (Managed Care) | Admitting: Rehabilitative and Restorative Service Providers"

## 2024-05-10 ENCOUNTER — Ambulatory Visit (INDEPENDENT_AMBULATORY_CARE_PROVIDER_SITE_OTHER): Payer: Medicare (Managed Care) | Admitting: Pulmonary Disease

## 2024-05-10 ENCOUNTER — Encounter (HOSPITAL_BASED_OUTPATIENT_CLINIC_OR_DEPARTMENT_OTHER): Payer: Self-pay | Admitting: Pulmonary Disease

## 2024-05-10 ENCOUNTER — Encounter: Payer: Self-pay | Admitting: Rehabilitative and Restorative Service Providers"

## 2024-05-10 VITALS — BP 101/68 | HR 66 | Ht 68.0 in | Wt 230.0 lb

## 2024-05-10 DIAGNOSIS — J42 Unspecified chronic bronchitis: Secondary | ICD-10-CM

## 2024-05-10 DIAGNOSIS — M25662 Stiffness of left knee, not elsewhere classified: Secondary | ICD-10-CM

## 2024-05-10 DIAGNOSIS — R600 Localized edema: Secondary | ICD-10-CM

## 2024-05-10 DIAGNOSIS — M6281 Muscle weakness (generalized): Secondary | ICD-10-CM | POA: Diagnosis not present

## 2024-05-10 DIAGNOSIS — G4733 Obstructive sleep apnea (adult) (pediatric): Secondary | ICD-10-CM

## 2024-05-10 DIAGNOSIS — J4540 Moderate persistent asthma, uncomplicated: Secondary | ICD-10-CM

## 2024-05-10 DIAGNOSIS — Z87891 Personal history of nicotine dependence: Secondary | ICD-10-CM

## 2024-05-10 DIAGNOSIS — R262 Difficulty in walking, not elsewhere classified: Secondary | ICD-10-CM | POA: Diagnosis not present

## 2024-05-10 DIAGNOSIS — M25562 Pain in left knee: Secondary | ICD-10-CM | POA: Diagnosis not present

## 2024-05-10 MED ORDER — BREZTRI AEROSPHERE 160-9-4.8 MCG/ACT IN AERO
2.0000 | INHALATION_SPRAY | Freq: Two times a day (BID) | RESPIRATORY_TRACT | 10 refills | Status: AC
  Filled 2024-05-31 – 2024-07-02 (×2): qty 10.7, 30d supply, fill #0
  Filled 2024-08-14 – 2024-08-29 (×2): qty 10.7, 30d supply, fill #1

## 2024-05-10 NOTE — Therapy (Signed)
 OUTPATIENT PHYSICAL THERAPY TREATMENT   Patient Name: Richard Walker MRN: [id number] DOB:Nov 01, 1955, 68 y.o., male Today's Date: 05/10/2024  END OF SESSION:  PT End of Session - 05/10/24 1520     Visit Number 14    Number of Visits 24    Date for Recertification  06/27/24    Authorization Type [employer] 20% coinsurance    Authorization - Number of Visits 33    Progress Note Due on Visit 20    PT Start Time 1510    PT Stop Time 1556    PT Time Calculation (min) 46 min    Activity Tolerance Patient tolerated treatment well    Behavior During Therapy WFL for tasks assessed/performed                Past Medical History:  Diagnosis Date   Arthritis    DDD (degenerative disc disease), lumbar    L3/L4   Diverticulosis    Dysrhythmia    SVT s/p ablation   Dysrhythmia    PAF   GERD (gastroesophageal reflux disease)    Hyperlipidemia    Low sperm motility    Myocardial infarction (HCC)    Pre-diabetes    Sleep apnea    uses cpap    SVT (supraventricular tachycardia) (HCC)    Past Surgical History:  Procedure Laterality Date   AMPUTATION Left 07/15/2018   Procedure: . REPAIR OF LEFT INDEX AND LONG FINGER BY FUSION /  REPAIR OF RING FINGER;  Surgeon: Sebastian Lenis, MD;  Location: [facility] OR;  Service: Orthopedics;  Laterality: Left;   BACK SURGERY     BLALOCK PROCEDURE     Ablation   CARDIAC CATHETERIZATION N/A 12/22/2015   Procedure: Left Heart Cath and Coronary Angiography;  Surgeon: Peter M Swaziland, MD;  Location: [facility] INVASIVE CV LAB;  Service: Cardiovascular;  Laterality: N/A;   CARDIAC ELECTROPHYSIOLOGY STUDY AND ABLATION     CORONARY STENT INTERVENTION N/A 05/01/2024   Procedure: CORONARY STENT INTERVENTION;  Surgeon: Ladona Heinz, MD;  Location: [facility] INVASIVE CV LAB;  Service: Cardiovascular;  Laterality: N/A;   ELECTROPHYSIOLOGIC STUDY N/A 02/19/2016   Procedure: SVT Ablation;  Surgeon: Danelle LELON Birmingham, MD;  Location: [facility] INVASIVE CV LAB;  Service:  Cardiovascular;  Laterality: N/A;   HAND SURGERY     LEFT HEART CATH AND CORONARY ANGIOGRAPHY N/A 05/01/2024   Procedure: LEFT HEART CATH AND CORONARY ANGIOGRAPHY;  Surgeon: Ladona Heinz, MD;  Location: [facility] INVASIVE CV LAB;  Service: Cardiovascular;  Laterality: N/A;   septal reconstruction  2017   TOTAL KNEE ARTHROPLASTY Left 03/27/2024   Procedure: ARTHROPLASTY, KNEE, TOTAL, LEFT;  Surgeon: Addie Cordella Hamilton, MD;  Location: [facility] OR;  Service: Orthopedics;  Laterality: Left;   TURBINATE REDUCTION Bilateral 07/2016   WISDOM TOOTH EXTRACTION     Patient Active Problem List   Diagnosis Date Noted   Use of opiates for therapeutic purposes 05/09/2024   CAD (coronary artery disease) 05/01/2024   Arthritis of left knee 04/15/2024   S/P TKR (total knee replacement), left 03/29/2024   S/P total knee arthroplasty, left 03/27/2024   Constipation 02/14/2024   Metabolic dysfunction-associated steatotic liver disease (MASLD) 01/13/2024   Abnormal food appetite 11/17/2023   Insulin  resistance 11/17/2023   Class 2 severe obesity due to excess calories with serious comorbidity and body mass index (BMI) of 35.0 to 35.9 in adult [facility]) 11/03/2023   Prediabetes 11/03/2023   Degenerative disc disease, lumbar 12/09/2022   Morbid obesity (HCC) 10/26/2022   Bronchitis  01/29/2022   OSA on CPAP 02/19/2021   Bite, snake 01/28/2021   Neck pain 11/13/2020   Vertigo 11/13/2020   Hypertension 11/03/2020   Contusion of bone 10/23/2020   Degenerative arthritis of knee, bilateral 10/23/2020   Concussion with no loss of consciousness 10/23/2020   Paroxysmal atrial fibrillation (HCC) 05/22/2019   Unspecified atrial flutter (HCC) 05/22/2019   Pain in right foot 03/30/2018   Perennial and seasonal allergic rhinitis 11/09/2017   History of food allergy 11/09/2017   Bronchitis, mucopurulent recurrent (HCC) 09/07/2017   SVT (supraventricular tachycardia) (HCC) 04/23/2015   Abdominal pain, epigastric 06/03/2013   Vitamin D   deficiency 04/09/2013   Esophageal reflux 04/09/2013   Hepatitis B carrier (HCC) 04/09/2013   Other and unspecified hyperlipidemia 04/09/2013   Sleep apnea 04/09/2013    PCP: Dallas Maxwell, PA-C  REFERRING PROVIDER: Cordella Glendia Hutchinson, MD  REFERRING DIAG:  Diagnosis  R53.1 (ICD-10-CM) - Weakness    THERAPY DIAG:  Difficulty in walking, not elsewhere classified  Muscle weakness (generalized)  Localized edema  Acute pain of left knee  Knee stiffness, left  Rationale for Evaluation and Treatment: Rehabilitation  ONSET DATE: 03/27/2024  SUBJECTIVE:   SUBJECTIVE STATEMENT: Pt indicated pain doing better than the last few weeks, up to 6-7/10 still though. Indicated he wanted to try to walk without cane (but antalgic gait noted)  PERTINENT HISTORY: L3-4 lumbar DDD; pre-diabetes; previous back surgery; cardiac history  PAIN:  NPRS scale: up to 6-7/10 Pain location: Lt Knee Pain description: Ache, sore, stiff Aggravating factors: Sleeping  Relieving factors: Pain medications and ice  PRECAUTIONS: Back  RED FLAGS: None   WEIGHT BEARING RESTRICTIONS: No  FALLS:  Has patient fallen in last 6 months? No  LIVING ENVIRONMENT: Lives with: lives with their family, lives with their son, and lives with their daughter Lives in: House/apartment Stairs: Avoids stairs, walker and help for stairs now Has following equipment at home: Vannie - 2 wheeled  OCCUPATION: Retired  PLOF: Independent  PATIENT GOALS: Yard work, be more active   OBJECTIVE:  Note: Objective measures were completed at Evaluation unless otherwise noted.  DIAGNOSTIC FINDINGS: See chart  PATIENT SURVEYS:  PSFS: THE PATIENT SPECIFIC FUNCTIONAL SCALE  Place score of 0-10 (0 = unable to perform activity and 10 = able to perform activity at the same level as before injury or problem)  Activity Date: 04/04/2024 04/26/2024   Sleep 0 4   2.  Sitting 2 6   3.  Moving around 2 7   4.  Showering 1 10    Total Score 1.25  6.75     Total Score = Sum of activity scores/number of activities  Minimally Detectable Change: 3 points (for single activity); 2 points (for average score)   COGNITION: 04/04/2024 Overall cognitive status: Within functional limits for tasks assessed     SENSATION: 04/04/2024 [facility]  EDEMA:  04/04/2024 Noted and not objectively assessed   LOWER EXTREMITY ROM:  Active ROM Left/Right 04/04/2024 Left  04/05/2024 Left 04/10/2024 04/13/24 04/17/24 Left 04/19/24 Left 04/26/2024  Knee flexion 62/145 82 with belt supine 100 Supine AROM heel slide P: 100 (guarding) Seated P: 112* Supine  A: 96* A: 114 Active 125  Knee extension -5/0 -3 supine -10 in seated LAQ AROM A: -7 (seated LAQ) LAQ A: -5* Standing A: -3* P: 0 Active lacks 3 degrees   (Blank rows = not tested)  LOWER EXTREMITY STRENGTH:  MMT Right  04/04/2024 Left 04/04/2024 Right 04/27/2024 Left 04/30/2024 Left 05/08/2024  Hip flexion       Hip extension       Hip abduction       Hip adduction       Hip internal rotation       Hip external rotation       Knee flexion       Knee extension 4+ 2+ 81.1 in pounds (236 body weight) 25.7 lbs 31,.9, 28.4 4+/5  Ankle dorsiflexion       Ankle plantarflexion       Ankle inversion       Ankle eversion        (Blank rows = not tested)  GAIT: 05/04/2024: SPC use today in ambulation in clinic.   04/30/2024: Ambulation c FWW today with improve sequencing noted compared to [facility] in last visits.   04/04/2024 Distance walked: 50 feet Assistive device utilized: Environmental consultant - 2 wheeled Level of assistance: Modified independence Comments: Sahas was assistive device free before surgery                    TREATMENT       DATE: 05/10/2024 Therex: Recumbent bike lvl 4 10 mins for ROM only, seat 7 then 6 once loosed up.  Incilne gastroc stretch 30 sec x 3  Seated LAQ Lt 5 lbs 3 x 15  Supine TKE stretch start of vaso to tolerance approx 4 mins.  TherActivity (to improve stairs,  squatting, transfers Lateral step down 4 inch step WB 2 x 10 , performed bilaterally  Sit to stand to sit 18 inch table height no UE assist x 10 with slow lowering focus.    Neuro Re-ed (balance improvement Lateral step over 9 inch hurdle x 10 bilateral with light hand assist on bar SLS with step over and back 9 inch hurdle x 12 bilateral occasional HHA    Gait Training Verbal cues and visual demonstration for SPC height adjustment to prevent weight shift/lean to Rt.  Trial in clinic to address.  Discussed importance of using cane when symptoms are noted in ambulation to avoid antalgic gait impact.  Performed in clinic household distances for review throughout visit.    TREATMENT       DATE: 05/08/2024 Therex: Recumbent bike lvl 3 9 mins for ROM only, seat 6.   Standing incline gastroc stretch 30 sec x 3 Seated tailgate flexion 1 min Seated Lt knee 4 lbs 2 x 15 LAQ Seated quad set with SLR x 15 slow on Lt leg   Supine TKE stretch start of vaso to tolerance approx 3 mins.   TherActivity (to improve stairs, squatting, transfers Leg press 75 lbs slow lowering double leg x 15  (kept weight lower due to recent stents procedure) Leg press single leg 37 lbs x 15  , 31 lbs x 15 (kept weight lower due to recent stents procedure)  Vaso: Lt knee 34 deg high compression 10 mins in elevation.  TKE stretch in supine to start    TREATMENT       DATE: 05/04/2024 Therex: Recumbent bike lvl 3 9 mins for ROM only, seat 7.  Slow RPM.  Seated Lt leg tailgate flexion swinging 1 min  Standing incline gastroc stretch 30 sec x 5  Supine Lt knee LAQ in 90 deg hip flexion x 15 Supine TKE stretch start of vaso to tolerance approx 3 mins.   Neuro  (balance control, muscle activation) Tandem stance on foam 1 min x 1 bilateral with occasional HHA Tandem ambulation on  foam in // bars with occasional HHA 6 ft x 5 each way with SBA Feet across airex foam bar for ankle strategy improvements 1 min  SLS with  contralateral leg over and back 6 inch hurdle x 10 bilateral    Vaso: Lt knee 34 deg high compression 10 mins in elevation.  TKE stretch in supine to start    TREATMENT       DATE: 04/30/2024 Therex: Recumbent bike seat 7 lvl 5 10 mins with :10 second faster top of minute from 2 mins to 8.  Supine heel slide, quad set/SLR combo x 10 Lt leg  Supine TKE stretch start of vaso to tolerance approx 3 mins.  Education for continued use at home.   Neuro  Tandem stance on foam in // bars with occasional HHA 1 min x 2 bilateral  Step over and back 4 inch step with forward/back stepping with single hand on bar x 10 each LE    TherActivity Double Leg Press 125lbs  x 15 full extension and slow eccentrics into flexion Single Leg Press 50lbs  2 x 15 Lt full extension and slow eccentrics into flexion  Vaso: Lt knee 34 deg high compression 10 mins in elevation.  TKE stretch in supine to start    PATIENT EDUCATION:  Eval:  Education details: See above Person educated: Patient and Child(ren) Education method: Explanation, Demonstration, Tactile cues, Verbal cues, and Handouts Education comprehension: verbalized understanding, returned demonstration, verbal cues required, tactile cues required, and needs further education  HOME EXERCISE PROGRAM: Access Code: [email or web address] URL: [email or web address] Date: 04/26/2024 Prepared by: Lamar Ivory  Exercises - Seated Knee Flexion AAROM  - 5 x daily - 7 x weekly - 1 sets - 10 reps - 10 seconds hold - Supine Quadricep Sets  - 5 x daily - 7 x weekly - 2 sets - 10 reps - 5 second hold - Small Range Straight Leg Raise  - 2 x daily - 7 x weekly - 3-5 sets - 5 reps - 3 seconds hold - Tandem Stance  - 1 x daily - 7 x weekly - 1 sets - 10 reps - 20 second hold - Single Leg Stance  - 1 x daily - 7 x weekly - 1 sets - 10 reps - 10 seconds hold - Prone Quadricep Stretch with Strap  - 2 x daily - 7 x weekly - 1 sets - 5-10 reps - 20 seconds hold - Prone  Knee Extension with Ankle Weight  - 2 x daily - 7 x weekly - 1 sets - 1 reps - 3-5 minutes hold  ASSESSMENT:  CLINICAL IMPRESSION: Encouraged SPC use to offload Lt leg and improve gait cycle to avoid bad habits.  Continued strengthening and extension gains to help continue to improve functional WB activity.    OBJECTIVE IMPAIRMENTS: Abnormal gait, decreased activity tolerance, decreased endurance, decreased knowledge of condition, difficulty walking, decreased ROM, decreased strength, increased edema, impaired perceived functional ability, and pain.   ACTIVITY LIMITATIONS: bending, sitting, standing, squatting, sleeping, stairs, transfers, bed mobility, dressing, and locomotion level  PARTICIPATION LIMITATIONS: driving, community activity, and yard work  PERSONAL FACTORS: L3-4 lumbar DDD; pre-diabetes; previous back surgery; cardiac history are also affecting patient's functional outcome.    REHAB POTENTIAL: Good  CLINICAL DECISION MAKING: Stable/uncomplicated  EVALUATION COMPLEXITY: Low   GOALS: Goals reviewed with patient? Yes  SHORT TERM GOALS: Target date: 05/16/2024 [place] will be independent with his day 1 home exercise program Baseline: Started 04/04/2024 Goal status:  Met 04/25/2024  2.  Improve left knee active range of motion to 3 - 0 -100 degrees Baseline: 5 - 0 - 62 degrees Goal status: Met 04/25/2024  3.  Improve left quadriceps strength as assessed by MMT and functional testing Baseline: 2+/5 at evaluation Goal status: Ongoing   04/27/2024  4.  Alyas will be independent with a single-point cane for ambulation outside the home Baseline: Wheeled walker full-time Goal status: Met 04/25/2024   LONG TERM GOALS: Target date: 06/27/2024  Improve patient specific functional score to at least 5 Baseline: 0.125 Goal status: Met 04/26/2024  2.  [place] will report left knee pain consistently 0-3/10 on the numeric pain rating scale Baseline: 10/10 Goal status: Ongoing   04/26/2024  3.   Improve left knee AROM to 2 - 0 - 110 degrees or better Baseline: 5 - 0 - 62 degrees Goal status: Partially Met 04/26/2024  4.  Improve left quadriceps strength as assessed by MMT and Mckennon's ability to ambulate without an assistive device 100% of the time Baseline: Wheeled walker full-time Goal status: Ongoing   04/26/2024  5.  [place] will be independent with his long-term maintenance home exercise program at discharge Baseline: Started 04/04/2024 Goal status: Ongoing   04/26/2024    PLAN:  PT FREQUENCY: 2-3x/week  PT DURATION: 12 weeks  PLANNED INTERVENTIONS: 97750- Physical Performance Testing, 97110-Therapeutic exercises, 97530- Therapeutic activity, 97112- Neuromuscular re-education, 97535- Self Care, [id number]- Manual therapy, [phone]- Gait training, [phone]- Vasopneumatic device, Patient/Family education, Balance training, Stair training, Joint mobilization, and Cryotherapy  PLAN FOR NEXT SESSION: WB strengthening as able, dynamic balance improvements.   NEXT MD VISIT: 05/14/2024  Ozell Silvan, PT, DPT, OCS, ATC 05/10/24  3:46 PM

## 2024-05-10 NOTE — Progress Notes (Unsigned)
 ////    Subjective:   PATIENT ID: Richard F Granholm GENDER: male DOB: 03-26-1956, MRN: [id number]  Chief Complaint  Patient presents with   Follow-up    Asthma     Reason for Visit: Follow-up  Richard Walker is a 68 year old male with asthma, OSA, SVT, atrial fibrillation who presents for follow-up for asthma.  Initial visit Compliant with Breztri . Uses albuterol  2-3 times a day and sometimes more with activity. Unsure if he is using albuterol  due to lung related symptoms.No cough or wheezing. Reports fatigue and shortness of breath with movement. Limited by knee pain as well. He knows he needs to lose weight. Planning to join programs to help lose weight. Previously active with sports but not since 2018.  05/10/24 Since our last visit he has had left knee surgery last month. He reports he has had worsening fatigue and work-up demonstrated Cardiac PET concerning for ischemia. He had LHC 05/01/24 with PTCA to mLAD and dLAD. He is waiting for PT for his knee but is planned for cardiac rehab. He has been compliant with Breztri  but has needed to use albuterol  more frequently due to his shortness of breath post-procedure. Has been compliant with CPAP.   Past Medical History:  Diagnosis Date   Arthritis    DDD (degenerative disc disease), lumbar    L3/L4   Diverticulosis    Dysrhythmia    SVT s/p ablation   Dysrhythmia    PAF   GERD (gastroesophageal reflux disease)    Hyperlipidemia    Low sperm motility    Myocardial infarction (HCC)    Pre-diabetes    Sleep apnea    uses cpap    SVT (supraventricular tachycardia) (HCC)      Family History  Problem Relation Age of Onset   Stroke Mother    Heart disease Mother    High Cholesterol Mother    Sleep apnea Father    Hypertension Father    Food Allergy Son    Colon cancer Neg Hx    Stomach cancer Neg Hx    Allergic rhinitis Neg Hx    Angioedema Neg Hx    Asthma Neg Hx    Eczema Neg Hx    Immunodeficiency Neg Hx    Urticaria  Neg Hx      Social History   Occupational History   Occupation: retired  Tobacco Use   Smoking status: Former    Current packs/day: 0.00    Average packs/day: 1 pack/day for 10.0 years (10.0 ttl pk-yrs)    Types: Cigarettes    Start date: 44    Quit date: 1992    Years since quitting: 33.7   Smokeless tobacco: Never   Tobacco comments:    Uses Hookah Pipe occasionally  Vaping Use   Vaping status: Never Used  Substance and Sexual Activity   Alcohol use: No   Drug use: No   Sexual activity: Yes    Allergies  Allergen Reactions   Dust Mite Extract    Statins     Muscle aches   Tree Extract Other (See Comments)    Intolerance Grass     Outpatient Medications Prior to Visit  Medication Sig Dispense Refill   albuterol  (PROAIR  HFA) 108 (90 Base) MCG/ACT inhaler Inhale 1-2 puffs into the lungs every 6 (six) hours as needed for wheezing or shortness of breath. 24 Walker 3   aspirin  EC 81 MG tablet Take 1 tablet (81 mg total) by mouth daily. Swallow  whole. 15 tablet 0   azelastine  (ASTELIN ) 0.1 % nasal spray 1-2 sprays per nostril 2 times daily as needed. 90 mL 3   cholecalciferol  (VITAMIN D3) 25 MCG (1000 UNIT) tablet Take 4,000 Units by mouth daily.     clopidogrel  (PLAVIX ) 75 MG tablet Take 1 tablet (75 mg total) by mouth daily. 30 tablet 5   docusate sodium  (COLACE) 100 MG capsule Take 1 capsule (100 mg total) by mouth 2 (two) times daily. 30 capsule 0   ezetimibe  (ZETIA ) 10 MG tablet Take 1 tablet (10 mg total) by mouth daily. 90 tablet 3   fenofibrate  (TRICOR ) 145 MG tablet Take 1 tablet (145 mg total) by mouth daily. 90 tablet 3   gabapentin  (NEURONTIN ) 100 MG capsule Take 1 capsule (100 mg total) by mouth 2 (two) times daily. 42 capsule 0   losartan  (COZAAR ) 25 MG tablet Take 1 tablet (25 mg total) by mouth daily. 90 tablet 3   Magnesium  250 MG TABS Take 250 mg by mouth once a week.     metFORMIN  (GLUCOPHAGE ) 500 MG tablet TAKE 1 TABLET(500 MG) BY MOUTH TWICE DAILY WITH A  MEAL 180 tablet 3   methocarbamol  (ROBAXIN ) 500 MG tablet Take 1 tablet (500 mg total) by mouth every 8 (eight) hours as needed for muscle spasms. 30 tablet 0   metoprolol  tartrate (LOPRESSOR ) 25 MG tablet Take one tablet by mouth twice a day.  May take one additional tablet by mouth as needed for breakthrough palpitations. 270 tablet 3   nitroGLYCERIN  (NITROSTAT ) 0.4 MG SL tablet Place 1 tablet (0.4 mg total) under the tongue every 5 (five) minutes as needed for up to 25 days for chest pain. Do not use for 24 hours after use of Viagra  25 tablet 0   oxyCODONE  (ROXICODONE ) 5 MG immediate release tablet Take 1 tablet (5 mg total) by mouth every 4 (four) hours as needed for severe pain (pain score 7-10). 30 tablet 0   pantoprazole  (PROTONIX ) 40 MG tablet Take 1 tablet (40 mg total) by mouth daily. 30 tablet 11   PRESCRIPTION MEDICATION Inhale into the lungs at bedtime. CPAP     rivaroxaban  (XARELTO ) 20 MG TABS tablet Take 1 tablet (20 mg total) by mouth daily with supper.     rosuvastatin  (CRESTOR ) 10 MG tablet Take 1 tablet (10 mg total) by mouth daily. 30 tablet 2   sildenafil  (VIAGRA ) 100 MG tablet Take 1 tablet (100 mg total) by mouth daily as needed for erectile dysfunction. 30 tablet 3   tirzepatide  (ZEPBOUND ) 7.5 MG/0.5ML Pen Inject 7.5 mg into the skin once a week. 2 mL 0   Vitamin D , Ergocalciferol , (DRISDOL ) 1.25 MG (50000 UNIT) CAPS capsule Take 1 capsule (50,000 Units total) by mouth every 7 (seven) days. 8 capsule 3   Budeson-Glycopyrrol-Formoterol  (BREZTRI  AEROSPHERE) 160-9-4.8 MCG/ACT AERO Inhale 2 puffs into the lungs in the morning and at bedtime. 10.7 Walker 11   No facility-administered medications prior to visit.    Review of Systems  Constitutional:  Positive for malaise/fatigue. Negative for chills, diaphoresis, fever and weight loss.  HENT:  Negative for congestion.   Respiratory:  Positive for shortness of breath. Negative for cough, hemoptysis, sputum production and wheezing.    Cardiovascular:  Negative for chest pain, palpitations and leg swelling.  Musculoskeletal:  Positive for joint pain.     Objective:   Vitals:   05/10/24 0946  BP: 101/68  Pulse: 66  SpO2: 98%  Weight: 230 lb (104.3 kg)  Height: 5' 8 (1.727 m)   SpO2: 98 %  Physical Exam: General: Well-appearing, no acute distress HENT: [place], AT Eyes: EOMI, no scleral icterus Respiratory: Clear to auscultation bilaterally.  No crackles, wheezing or rales Cardiovascular: RRR, -M/R/Walker, no JVD Extremities:-Edema,-tenderness Neuro: AAO x4, CNII-XII grossly intact Psych: Normal mood, normal affect  Data Reviewed:  Imaging: CT Chest 07/16/20 - Overall normal parenchymal except for small thin walled cyst in RML and mild chronic scarring in lingula  PFT: 10/06/23 FVC 3.21 (74%) FEV1 2.57 (80%) Ratio 80  TLC 93% DLCO 104%. No significant BD response Interpretation: Normal PFTs. N   Labs: CBC    Component Value Date/Time   WBC 6.3 04/26/2024 1232   WBC 6.7 03/20/2024 1450   RBC 5.21 04/26/2024 1232   RBC 5.67 03/20/2024 1450   HGB 14.5 04/26/2024 1232   HCT 45.2 04/26/2024 1232   PLT 239 04/26/2024 1232   MCV 87 04/26/2024 1232   MCH 27.8 04/26/2024 1232   MCH 28.6 03/20/2024 1450   MCHC 32.1 04/26/2024 1232   MCHC 33.3 03/20/2024 1450   RDW 13.4 04/26/2024 1232   LYMPHSABS 1.7 01/20/2024 1057   MONOABS 0.5 01/20/2024 1057   EOSABS 0.1 01/20/2024 1057   BASOSABS 0.0 01/20/2024 1057        Assessment & Plan:   Discussion: 68 year old male with asthma, OSA, SVT, atrial fibrillation who presents for follow-up for asthma. Worsening dyspnea but likely due to deconditioning. Optimized on high triple therapy. Encouraged cardiac rehab when able. Discussed clinical course and management of asthma including bronchodilator regimen, preventive care and action plan for exacerbation.   Moderate persistent asthma Chronic bronchitis --Pulmonary function tests. Normal --CONTINUE Breztri  TWO  puffs in the morning and morning. Rinse mouth after use --CONTINUE Albuterol  TWO puffs every 6 hours as needed --Followed by Dr. Green with Allergy  Severe OSA  --Sleep study 02/2021 AHI 50 --Managed by Dr. Chalice with [facility] --Encourage to discuss SOB related to uncontrolled OSA and consider PAP titration --Currently on Zepbound   Health Maintenance Immunization History  Administered Date(s) Administered   Fluad Quad(high Dose 65+) 07/29/2021, 09/13/2022   Influenza Split 05/23/2017   Influenza,inj,Quad PF,6+ Mos 09/18/2018, 08/14/2020   Influenza,inj,Quad PF,6-35 Mos 09/18/2018, 08/14/2020   Influenza,inj,quad, With Preservative 05/23/2017   Influenza-Unspecified 05/23/2017, 09/19/2018   PFIZER(Purple Top)SARS-COV-2 Vaccination 10/02/2020, 11/04/2020   PNEUMOCOCCAL CONJUGATE-20 09/13/2022   PPD Test 02/26/2015   Pneumococcal Polysaccharide-23 10/24/2017   Td 09/15/2001   Td (Adult),5 Lf Tetanus Toxid, Preservative Free 09/15/2001   Tdap 06/16/2007, 10/24/2017, 01/21/2021   Zoster Recombinant(Shingrix) 10/15/2020, 03/02/2021   Zoster, Live 04/19/2012   CT Lung Screen - review at next visit  No orders of the defined types were placed in this encounter.  Meds ordered this encounter  Medications   budesonide -glycopyrrolate -formoterol  (BREZTRI  AEROSPHERE) 160-9-4.8 MCG/ACT AERO inhaler    Sig: Inhale 2 puffs into the lungs in the morning and at bedtime.    Dispense:  10.7 Walker    Refill:  11    Manufacturer?:   [employer] [[id number]]    Return in about 6 months (around 11/07/2024).  I have spent a total time of 20-minutes on the day of the appointment including chart review, data review, collecting history, coordinating care and discussing medical diagnosis and plan with the patient/family. Past medical history, allergies, medications were reviewed. Pertinent imaging, labs and tests included in this note have been reviewed and interpreted independently by me.  Jerad Dunlap Slater Staff, MD [facility] 05/10/2024 10:25 AM

## 2024-05-11 ENCOUNTER — Encounter (HOSPITAL_BASED_OUTPATIENT_CLINIC_OR_DEPARTMENT_OTHER): Payer: Self-pay | Admitting: Pulmonary Disease

## 2024-05-14 ENCOUNTER — Ambulatory Visit (INDEPENDENT_AMBULATORY_CARE_PROVIDER_SITE_OTHER): Payer: Medicare (Managed Care) | Admitting: Surgical

## 2024-05-14 DIAGNOSIS — Z96652 Presence of left artificial knee joint: Secondary | ICD-10-CM

## 2024-05-14 MED ORDER — METHOCARBAMOL 500 MG PO TABS: 500.0000 mg | ORAL_TABLET | Freq: Three times a day (TID) | ORAL | 0 refills | Status: DC | PRN

## 2024-05-14 MED ORDER — DOCUSATE SODIUM 100 MG PO CAPS: 100.0000 mg | ORAL_CAPSULE | Freq: Two times a day (BID) | ORAL | 0 refills | Status: AC

## 2024-05-14 MED ORDER — OXYCODONE HCL 5 MG PO TABS: 5.0000 mg | ORAL_TABLET | Freq: Four times a day (QID) | ORAL | 0 refills | Status: DC | PRN

## 2024-05-14 NOTE — Progress Notes (Signed)
 Post-Op Visit Note   Patient: Richard Walker           Date of Birth: 1956/04/28           MRN: [id number] Visit Date: 05/14/2024 PCP: Dorina Loving, PA-C   Assessment & Plan:  Chief Complaint:  Chief Complaint  Patient presents with   Left Knee - Follow-up    03/27/2024 Left TKA   Visit Diagnoses:  1. Status post total left knee replacement     Plan: Patient is a 68 year old male who presents s/p left total knee arthroplasty on 8//25.  He feels PT is going well.  Ambulating with a cane.  Still has continued pain and is taking oxycodone  for pain control about every 4-6 hours still.  He is requesting 10 mg oxycodone .  Has burning sensation over the lateral aspect of the left knee intermittently.  No fevers or chills.  No drainage from the incision.  He is accompanied by his son who states he is improving steadily.  On exam, patient has 2 degrees extension and 125 degrees of knee flexion.  He has no significant effusion.  Incision is healing well without evidence of infection or dehiscence.  Able to perform straight leg raise without extensor lag.  Quad strength rated 5 -/5.  Ambulates with mild antalgia.  Plan at this time is refill oxycodone  on but now that he is approaching nearly 2 months out from surgery, we really need to wean off of this medication.  He understands that every time we refill this medication we will lower the frequency and ultimately he should be off after a few more weeks.  We will not refill 10 mg of oxycodone .  Additionally, we discussed techniques for alternative kneeling that he can do for his faith that will not involve putting actual pressure on the patella of the left knee.  We will have him continue with physical therapy and follow-up in 6 weeks for likely final check.  Call with any concerns in the meantime.  Follow-Up Instructions: No follow-ups on file.   Orders:  No orders of the defined types were placed in this encounter.  Meds ordered this  encounter  Medications   docusate sodium  (COLACE) 100 MG capsule    Sig: Take 1 capsule (100 mg total) by mouth 2 (two) times daily.    Dispense:  30 capsule    Refill:  0   oxyCODONE  (ROXICODONE ) 5 MG immediate release tablet    Sig: Take 1 tablet (5 mg total) by mouth every 6 (six) hours as needed for severe pain (pain score 7-10).    Dispense:  30 tablet    Refill:  0   methocarbamol  (ROBAXIN ) 500 MG tablet    Sig: Take 1 tablet (500 mg total) by mouth every 8 (eight) hours as needed for muscle spasms.    Dispense:  30 tablet    Refill:  0    Imaging: No results found.  PMFS History: Patient Active Problem List   Diagnosis Date Noted   Use of opiates for therapeutic purposes 05/09/2024   CAD (coronary artery disease) 05/01/2024   Arthritis of left knee 04/15/2024   S/P TKR (total knee replacement), left 03/29/2024   S/P total knee arthroplasty, left 03/27/2024   Constipation 02/14/2024   Metabolic dysfunction-associated steatotic liver disease (MASLD) 01/13/2024   Abnormal food appetite 11/17/2023   Insulin  resistance 11/17/2023   Class 2 severe obesity due to excess calories with serious comorbidity and body mass index (  BMI) of 35.0 to 35.9 in adult [facility]) 11/03/2023   Prediabetes 11/03/2023   Degenerative disc disease, lumbar 12/09/2022   Morbid obesity (HCC) 10/26/2022   Bronchitis 01/29/2022   OSA on CPAP 02/19/2021   Bite, snake 01/28/2021   Neck pain 11/13/2020   Vertigo 11/13/2020   Hypertension 11/03/2020   Contusion of bone 10/23/2020   Degenerative arthritis of knee, bilateral 10/23/2020   Concussion with no loss of consciousness 10/23/2020   Paroxysmal atrial fibrillation (HCC) 05/22/2019   Unspecified atrial flutter (HCC) 05/22/2019   Pain in right foot 03/30/2018   Perennial and seasonal allergic rhinitis 11/09/2017   History of food allergy 11/09/2017   Bronchitis, mucopurulent recurrent (HCC) 09/07/2017   SVT (supraventricular tachycardia) 04/23/2015    Abdominal pain, epigastric 06/03/2013   Vitamin D  deficiency 04/09/2013   Esophageal reflux 04/09/2013   Hepatitis B carrier (HCC) 04/09/2013   Other and unspecified hyperlipidemia 04/09/2013   Sleep apnea 04/09/2013   Past Medical History:  Diagnosis Date   Arthritis    DDD (degenerative disc disease), lumbar    L3/L4   Diverticulosis    Dysrhythmia    SVT s/p ablation   Dysrhythmia    PAF   GERD (gastroesophageal reflux disease)    Hyperlipidemia    Low sperm motility    Myocardial infarction (HCC)    Pre-diabetes    Sleep apnea    uses cpap    SVT (supraventricular tachycardia)     Family History  Problem Relation Age of Onset   Stroke Mother    Heart disease Mother    High Cholesterol Mother    Sleep apnea Father    Hypertension Father    Food Allergy Son    Colon cancer Neg Hx    Stomach cancer Neg Hx    Allergic rhinitis Neg Hx    Angioedema Neg Hx    Asthma Neg Hx    Eczema Neg Hx    Immunodeficiency Neg Hx    Urticaria Neg Hx     Past Surgical History:  Procedure Laterality Date   AMPUTATION Left 07/15/2018   Procedure: . REPAIR OF LEFT INDEX AND LONG FINGER BY FUSION /  REPAIR OF RING FINGER;  Surgeon: Sebastian Lenis, MD;  Location: [facility] OR;  Service: Orthopedics;  Laterality: Left;   BACK SURGERY     BLALOCK PROCEDURE     Ablation   CARDIAC CATHETERIZATION N/A 12/22/2015   Procedure: Left Heart Cath and Coronary Angiography;  Surgeon: Peter M Swaziland, MD;  Location: [facility] INVASIVE CV LAB;  Service: Cardiovascular;  Laterality: N/A;   CARDIAC ELECTROPHYSIOLOGY STUDY AND ABLATION     CORONARY STENT INTERVENTION N/A 05/01/2024   Procedure: CORONARY STENT INTERVENTION;  Surgeon: Ladona Heinz, MD;  Location: [facility] INVASIVE CV LAB;  Service: Cardiovascular;  Laterality: N/A;   ELECTROPHYSIOLOGIC STUDY N/A 02/19/2016   Procedure: SVT Ablation;  Surgeon: Danelle LELON Birmingham, MD;  Location: [facility] INVASIVE CV LAB;  Service: Cardiovascular;  Laterality: N/A;   HAND SURGERY      LEFT HEART CATH AND CORONARY ANGIOGRAPHY N/A 05/01/2024   Procedure: LEFT HEART CATH AND CORONARY ANGIOGRAPHY;  Surgeon: Ladona Heinz, MD;  Location: [facility] INVASIVE CV LAB;  Service: Cardiovascular;  Laterality: N/A;   septal reconstruction  2017   TOTAL KNEE ARTHROPLASTY Left 03/27/2024   Procedure: ARTHROPLASTY, KNEE, TOTAL, LEFT;  Surgeon: Addie Cordella Hamilton, MD;  Location: [facility] OR;  Service: Orthopedics;  Laterality: Left;   TURBINATE REDUCTION Bilateral 07/2016   WISDOM TOOTH EXTRACTION  Social History   Occupational History   Occupation: retired  Tobacco Use   Smoking status: Former    Current packs/day: 0.00    Average packs/day: 1 pack/day for 10.0 years (10.0 ttl pk-yrs)    Types: Cigarettes    Start date: 84    Quit date: 1992    Years since quitting: 33.7   Smokeless tobacco: Never   Tobacco comments:    Uses Hookah Pipe occasionally  Vaping Use   Vaping status: Never Used  Substance and Sexual Activity   Alcohol use: No   Drug use: No   Sexual activity: Yes

## 2024-05-15 NOTE — Progress Notes (Deleted)
 Cardiology Office Note:  .   Date:  05/15/2024  ID:  Richard Walker, DOB 09-19-1955, MRN [id number] PCP: Dorina Dallas RIGGERS  [facility] Providers Cardiologist:  None Electrophysiologist:  Danelle Birmingham, MD { Click to update primary MD,subspecialty MD or APP then REFRESH:1}  History of Present Illness: .   Richard Walker is a 68 y.o. male patient with history of SVT ablation on 02/19/2016, found to have PAF was on high dose flecainide  and being managed by Dr. Cathlyn Birmingham, hypertension, hypercholesterolemia, bronchial asthma, OSA on CPAP, prediabetes mellitus who was referred to me by Dr. Danelle Birmingham for evaluation of chest pain, abnormal stress test and underwent revascularization of the LAD CTO with implantation of DES Synergy stent and balloon angioplasty to mid to distal LAD on 05/01/2024 presents for follow-up.  Cardiac Studies relevent.    Cardiac Catheterization 05/01/24: Intervention data: Successful PTCA and stenting of the proximal and mid LAD with implantation of 3.0 x 28 and a 2.75 x 20 mm Synergy XD DES, proximal stent optimization with 3.5 x 10 mm emerge [place] balloon at 14 atmospheric pressure overall stenosis reduced from 99-100% mid LAD occlusion to 0% with TIMI 0 to TIMI-3 flow at the end of the procedure.  Balloon angioplasty only with a 2.5 x 15 mm emerge to the distal LAD 90% stenosis reduced to 0%.     ECHOCARDIOGRAM COMPLETE 10/29/2021  1. Left ventricular ejection fraction, by estimation, is 60 to 65%. The left ventricle has normal function. The left ventricle has no regional wall motion abnormalities. Left ventricular diastolic parameters are consistent with Grade II diastolic dysfunction (pseudonormalization). The average left ventricular global longitudinal strain is 21.6 %. The global longitudinal strain is normal. 2. Right ventricular systolic function is normal. The right ventricular size is normal. There is normal pulmonary artery systolic pressure. 3. Left  atrial size was mildly dilated.    Discussed the use of AI scribe software for clinical note transcription with the patient, who gave verbal consent to proceed.  History of Present Illness    Labs   Lab Results  Component Value Date   CHOL 209 (H) 01/18/2024   HDL 42.40 01/18/2024   LDLCALC 124 (H) 01/18/2024   LDLDIRECT 150.0 03/10/2023   TRIG 212.0 (H) 01/18/2024   CHOLHDL 5 01/18/2024   No results found for: LIPOA  Recent Labs    09/22/23 1405 11/03/23 1117 01/18/24 0935 03/20/24 1450 04/26/24 1232  NA 136   < > 138 136 138  K 4.5   < > 4.6 4.2 5.0  CL 103   < > 101 104 99  CO2 25   < > 26 23 24   GLUCOSE 138*   < > 111* 98 87  BUN 25*   < > 23 15 14   CREATININE 1.16   < > 1.12 1.06 1.20  CALCIUM  8.8*   < > 9.7 9.7 10.0  GFRNONAA >60  --   --  >60  --    < > = values in this interval not displayed.    Lab Results  Component Value Date   ALT 23 01/18/2024   AST 16 01/18/2024   ALKPHOS 83 01/18/2024   BILITOT 0.4 01/18/2024      Latest Ref Rng & Units 04/26/2024   12:32 PM 03/20/2024    2:50 PM 01/20/2024   10:57 AM  CBC  WBC 3.4 - 10.8 x10E3/uL 6.3  6.7  6.4   Hemoglobin 13.0 - 17.7 g/dL 14.5  16.2  15.5   Hematocrit 37.5 - 51.0 % 45.2  48.7  46.5   Platelets 150 - 450 x10E3/uL 239  237  195.0    Lab Results  Component Value Date   HGBA1C 5.9 01/18/2024    Lab Results  Component Value Date   TSH 1.81 01/20/2024    ROS  ***ROS Physical Exam:   VS:  There were no vitals taken for this visit.   Wt Readings from Last 3 Encounters:  05/10/24 230 lb (104.3 kg)  05/09/24 226 lb (102.5 kg)  05/01/24 236 lb (107 kg)    BP Readings from Last 3 Encounters:  05/10/24 101/68  05/09/24 124/80  05/01/24 131/84   ***Physical Exam EKG:         ASSESSMENT AND PLAN: .    No diagnosis found. Assessment & Plan    Follow up: ***  Signed,  [name], MD, [facility] 05/15/2024, 8:40 PM [facility] [address] [place], [place]  [address] Phone: [phone]. Fax:  [phone]

## 2024-05-16 ENCOUNTER — Ambulatory Visit: Payer: Medicare (Managed Care) | Admitting: Cardiology

## 2024-05-16 ENCOUNTER — Telehealth: Payer: Self-pay | Admitting: Cardiology

## 2024-05-16 ENCOUNTER — Other Ambulatory Visit (HOSPITAL_BASED_OUTPATIENT_CLINIC_OR_DEPARTMENT_OTHER): Payer: Self-pay

## 2024-05-16 DIAGNOSIS — E782 Mixed hyperlipidemia: Secondary | ICD-10-CM

## 2024-05-16 DIAGNOSIS — I480 Paroxysmal atrial fibrillation: Secondary | ICD-10-CM

## 2024-05-16 DIAGNOSIS — I25118 Atherosclerotic heart disease of native coronary artery with other forms of angina pectoris: Secondary | ICD-10-CM

## 2024-05-16 DIAGNOSIS — R5383 Other fatigue: Secondary | ICD-10-CM

## 2024-05-16 NOTE — Telephone Encounter (Signed)
 Pt is complaining about increased fatigue and nose bleeds since his PCI procedure. He had to reschedule post-op appt for 05/22/24, but he is concerned that he needs to be seen sooner. Requesting a callback to discuss his symptoms. Please advise.

## 2024-05-16 NOTE — Telephone Encounter (Signed)
 Yes CBC and stop Aspirin 

## 2024-05-16 NOTE — Telephone Encounter (Signed)
 Patient was supposed to be seen today. He got his appointment times mixed up.   He reports weakness in right hand post-cath. Some bruising persists.   He reports dried blood  in nose - no overt bleeding.  He is on DAPT + xarelto   He had fatigue with exertion prior to cath/PCI but now fatigue is more constant.   Advised will send to MD to review - ?CBC

## 2024-05-16 NOTE — Therapy (Incomplete)
 OUTPATIENT PHYSICAL THERAPY TREATMENT   Patient Name: Richard Walker MRN: [id number] DOB:09-Nov-1955, 68 y.o., male Today's Date: 05/16/2024  END OF SESSION:***          Past Medical History:  Diagnosis Date   Arthritis    DDD (degenerative disc disease), lumbar    L3/L4   Diverticulosis    Dysrhythmia    SVT s/p ablation   Dysrhythmia    PAF   GERD (gastroesophageal reflux disease)    Hyperlipidemia    Low sperm motility    Myocardial infarction [facility])    Pre-diabetes    Sleep apnea    uses cpap    SVT (supraventricular tachycardia)    Past Surgical History:  Procedure Laterality Date   AMPUTATION Left 07/15/2018   Procedure: . REPAIR OF LEFT INDEX AND LONG FINGER BY FUSION /  REPAIR OF RING FINGER;  Surgeon: Sebastian Lenis, MD;  Location: [facility] OR;  Service: Orthopedics;  Laterality: Left;   BACK SURGERY     BLALOCK PROCEDURE     Ablation   CARDIAC CATHETERIZATION N/A 12/22/2015   Procedure: Left Heart Cath and Coronary Angiography;  Surgeon: Peter M Swaziland, MD;  Location: [facility] INVASIVE CV LAB;  Service: Cardiovascular;  Laterality: N/A;   CARDIAC ELECTROPHYSIOLOGY STUDY AND ABLATION     CORONARY STENT INTERVENTION N/A 05/01/2024   Procedure: CORONARY STENT INTERVENTION;  Surgeon: Ladona Heinz, MD;  Location: [facility] INVASIVE CV LAB;  Service: Cardiovascular;  Laterality: N/A;   ELECTROPHYSIOLOGIC STUDY N/A 02/19/2016   Procedure: SVT Ablation;  Surgeon: Danelle LELON Birmingham, MD;  Location: [facility] INVASIVE CV LAB;  Service: Cardiovascular;  Laterality: N/A;   HAND SURGERY     LEFT HEART CATH AND CORONARY ANGIOGRAPHY N/A 05/01/2024   Procedure: LEFT HEART CATH AND CORONARY ANGIOGRAPHY;  Surgeon: Ladona Heinz, MD;  Location: [facility] INVASIVE CV LAB;  Service: Cardiovascular;  Laterality: N/A;   septal reconstruction  2017   TOTAL KNEE ARTHROPLASTY Left 03/27/2024   Procedure: ARTHROPLASTY, KNEE, TOTAL, LEFT;  Surgeon: Addie Cordella Hamilton, MD;  Location: [facility] OR;  Service: Orthopedics;  Laterality:  Left;   TURBINATE REDUCTION Bilateral 07/2016   WISDOM TOOTH EXTRACTION     Patient Active Problem List   Diagnosis Date Noted   Use of opiates for therapeutic purposes 05/09/2024   CAD (coronary artery disease) 05/01/2024   Arthritis of left knee 04/15/2024   S/P TKR (total knee replacement), left 03/29/2024   S/P total knee arthroplasty, left 03/27/2024   Constipation 02/14/2024   Metabolic dysfunction-associated steatotic liver disease (MASLD) 01/13/2024   Abnormal food appetite 11/17/2023   Insulin  resistance 11/17/2023   Class 2 severe obesity due to excess calories with serious comorbidity and body mass index (BMI) of 35.0 to 35.9 in adult 11/03/2023   Prediabetes 11/03/2023   Degenerative disc disease, lumbar 12/09/2022   Morbid obesity (HCC) 10/26/2022   Bronchitis 01/29/2022   OSA on CPAP 02/19/2021   Bite, snake 01/28/2021   Neck pain 11/13/2020   Vertigo 11/13/2020   Hypertension 11/03/2020   Contusion of bone 10/23/2020   Degenerative arthritis of knee, bilateral 10/23/2020   Concussion with no loss of consciousness 10/23/2020   Paroxysmal atrial fibrillation (HCC) 05/22/2019   Unspecified atrial flutter (HCC) 05/22/2019   Pain in right foot 03/30/2018   Perennial and seasonal allergic rhinitis 11/09/2017   History of food allergy 11/09/2017   Bronchitis, mucopurulent recurrent (HCC) 09/07/2017   SVT (supraventricular tachycardia) 04/23/2015   Abdominal pain, epigastric 06/03/2013   Vitamin D   deficiency 04/09/2013   Esophageal reflux 04/09/2013   Hepatitis B carrier (HCC) 04/09/2013   Other and unspecified hyperlipidemia 04/09/2013   Sleep apnea 04/09/2013    PCP: Dallas Maxwell, PA-C  REFERRING PROVIDER: Cordella Glendia Hutchinson, MD  REFERRING DIAG:  Diagnosis  R53.1 (ICD-10-CM) - Weakness    THERAPY DIAG:  No diagnosis found.  Rationale for Evaluation and Treatment: Rehabilitation  ONSET DATE: 03/27/2024  SUBJECTIVE:   SUBJECTIVE STATEMENT: ***Pt  indicated pain doing better than the last few weeks, up to 6-7/10 still though. Indicated he wanted to try to walk without cane (but antalgic gait noted)  PERTINENT HISTORY: L3-4 lumbar DDD; pre-diabetes; previous back surgery; cardiac history  PAIN:  ***NPRS scale: up to 6-7/10 Pain location: Lt Knee Pain description: Ache, sore, stiff Aggravating factors: Sleeping  Relieving factors: Pain medications and ice  PRECAUTIONS: Back  RED FLAGS: None   WEIGHT BEARING RESTRICTIONS: No  FALLS:  Has patient fallen in last 6 months? No  LIVING ENVIRONMENT: Lives with: lives with their family, lives with their son, and lives with their daughter Lives in: House/apartment Stairs: Avoids stairs, walker and help for stairs now Has following equipment at home: Vannie - 2 wheeled  OCCUPATION: Retired  PLOF: Independent  PATIENT GOALS: Yard work, be more active   OBJECTIVE:  Note: Objective measures were completed at Evaluation unless otherwise noted.  DIAGNOSTIC FINDINGS: See chart  PATIENT SURVEYS:  PSFS: THE PATIENT SPECIFIC FUNCTIONAL SCALE  Place score of 0-10 (0 = unable to perform activity and 10 = able to perform activity at the same level as before injury or problem)  Activity Date: 04/04/2024 04/26/2024   Sleep 0 4   2.  Sitting 2 6   3.  Moving around 2 7   4.  Showering 1 10   Total Score 1.25  6.75     Total Score = Sum of activity scores/number of activities  Minimally Detectable Change: 3 points (for single activity); 2 points (for average score)   COGNITION: 04/04/2024 Overall cognitive status: Within functional limits for tasks assessed     SENSATION: 04/04/2024 [facility]  EDEMA:  04/04/2024 Noted and not objectively assessed   LOWER EXTREMITY ROM:***  Active ROM Left/Right 04/04/2024 Left  04/05/2024 Left 04/10/2024 04/13/24 04/17/24 Left 04/19/24 Left 04/26/2024  Knee flexion 62/145 82 with belt supine 100 Supine AROM heel slide P: 100 (guarding)  Seated P: 112* Supine  A: 96* A: 114 Active 125  Knee extension -5/0 -3 supine -10 in seated LAQ AROM A: -7 (seated LAQ) LAQ A: -5* Standing A: -3* P: 0 Active lacks 3 degrees   (Blank rows = not tested)  LOWER EXTREMITY STRENGTH:  MMT Right  04/04/2024 Left 04/04/2024 Right 04/27/2024 Left 04/30/2024 Left 05/08/2024  Hip flexion       Hip extension       Hip abduction       Hip adduction       Hip internal rotation       Hip external rotation       Knee flexion       Knee extension 4+ 2+ 81.1 in pounds (236 body weight) 25.7 lbs 31,.9, 28.4 4+/5  Ankle dorsiflexion       Ankle plantarflexion       Ankle inversion       Ankle eversion        (Blank rows = not tested)  GAIT: 05/04/2024: SPC use today in ambulation in clinic.   04/30/2024: Ambulation c  FWW today with improve sequencing noted compared to [facility] in last visits.   04/04/2024 Distance walked: 50 feet Assistive device utilized: Walker - 2 wheeled Level of assistance: Modified independence Comments: Santos was assistive device free before surgery                    TREATMENT        DATE: 05/16/24***     DATE: 05/10/2024 Therex: Recumbent bike lvl 4 10 mins for ROM only, seat 7 then 6 once loosed up.  Incilne gastroc stretch 30 sec x 3  Seated LAQ Lt 5 lbs 3 x 15  Supine TKE stretch start of vaso to tolerance approx 4 mins.  TherActivity (to improve stairs, squatting, transfers Lateral step down 4 inch step WB 2 x 10 , performed bilaterally  Sit to stand to sit 18 inch table height no UE assist x 10 with slow lowering focus.    Neuro Re-ed (balance improvement Lateral step over 9 inch hurdle x 10 bilateral with light hand assist on bar SLS with step over and back 9 inch hurdle x 12 bilateral occasional HHA    Gait Training Verbal cues and visual demonstration for SPC height adjustment to prevent weight shift/lean to Rt.  Trial in clinic to address.  Discussed importance of using cane when symptoms are noted  in ambulation to avoid antalgic gait impact.  Performed in clinic household distances for review throughout visit.    TREATMENT       DATE: 05/08/2024 Therex: Recumbent bike lvl 3 9 mins for ROM only, seat 6.   Standing incline gastroc stretch 30 sec x 3 Seated tailgate flexion 1 min Seated Lt knee 4 lbs 2 x 15 LAQ Seated quad set with SLR x 15 slow on Lt leg   Supine TKE stretch start of vaso to tolerance approx 3 mins.   TherActivity (to improve stairs, squatting, transfers Leg press 75 lbs slow lowering double leg x 15  (kept weight lower due to recent stents procedure) Leg press single leg 37 lbs x 15  , 31 lbs x 15 (kept weight lower due to recent stents procedure)  Vaso: Lt knee 34 deg high compression 10 mins in elevation.  TKE stretch in supine to start    TREATMENT       DATE: 05/04/2024 Therex: Recumbent bike lvl 3 9 mins for ROM only, seat 7.  Slow RPM.  Seated Lt leg tailgate flexion swinging 1 min  Standing incline gastroc stretch 30 sec x 5  Supine Lt knee LAQ in 90 deg hip flexion x 15 Supine TKE stretch start of vaso to tolerance approx 3 mins.   Neuro  (balance control, muscle activation) Tandem stance on foam 1 min x 1 bilateral with occasional HHA Tandem ambulation on foam in // bars with occasional HHA 6 ft x 5 each way with SBA Feet across airex foam bar for ankle strategy improvements 1 min  SLS with contralateral leg over and back 6 inch hurdle x 10 bilateral    Vaso: Lt knee 34 deg high compression 10 mins in elevation.  TKE stretch in supine to start    TREATMENT       DATE: 04/30/2024 Therex: Recumbent bike seat 7 lvl 5 10 mins with :10 second faster top of minute from 2 mins to 8.  Supine heel slide, quad set/SLR combo x 10 Lt leg  Supine TKE stretch start of vaso to tolerance approx 3 mins.  Education for  continued use at home.   Neuro  Tandem stance on foam in // bars with occasional HHA 1 min x 2 bilateral  Step over and back 4 inch step  with forward/back stepping with single hand on bar x 10 each LE    TherActivity Double Leg Press 125lbs  x 15 full extension and slow eccentrics into flexion Single Leg Press 50lbs  2 x 15 Lt full extension and slow eccentrics into flexion  Vaso: Lt knee 34 deg high compression 10 mins in elevation.  TKE stretch in supine to start    PATIENT EDUCATION:  Eval:  Education details: See above Person educated: Patient and Child(ren) Education method: Explanation, Demonstration, Tactile cues, Verbal cues, and Handouts Education comprehension: verbalized understanding, returned demonstration, verbal cues required, tactile cues required, and needs further education  HOME EXERCISE PROGRAM: Access Code: [email or web address] URL: [email or web address] Date: 04/26/2024 Prepared by: Lamar Ivory  Exercises - Seated Knee Flexion AAROM  - 5 x daily - 7 x weekly - 1 sets - 10 reps - 10 seconds hold - Supine Quadricep Sets  - 5 x daily - 7 x weekly - 2 sets - 10 reps - 5 second hold - Small Range Straight Leg Raise  - 2 x daily - 7 x weekly - 3-5 sets - 5 reps - 3 seconds hold - Tandem Stance  - 1 x daily - 7 x weekly - 1 sets - 10 reps - 20 second hold - Single Leg Stance  - 1 x daily - 7 x weekly - 1 sets - 10 reps - 10 seconds hold - Prone Quadricep Stretch with Strap  - 2 x daily - 7 x weekly - 1 sets - 5-10 reps - 20 seconds hold - Prone Knee Extension with Ankle Weight  - 2 x daily - 7 x weekly - 1 sets - 1 reps - 3-5 minutes hold  ASSESSMENT:  CLINICAL IMPRESSION: ***Encouraged SPC use to offload Lt leg and improve gait cycle to avoid bad habits.  Continued strengthening and extension gains to help continue to improve functional WB activity.    OBJECTIVE IMPAIRMENTS: Abnormal gait, decreased activity tolerance, decreased endurance, decreased knowledge of condition, difficulty walking, decreased ROM, decreased strength, increased edema, impaired perceived functional ability, and pain.    ACTIVITY LIMITATIONS: bending, sitting, standing, squatting, sleeping, stairs, transfers, bed mobility, dressing, and locomotion level  PARTICIPATION LIMITATIONS: driving, community activity, and yard work  PERSONAL FACTORS: L3-4 lumbar DDD; pre-diabetes; previous back surgery; cardiac history are also affecting patient's functional outcome.    REHAB POTENTIAL: Good  CLINICAL DECISION MAKING: Stable/uncomplicated  EVALUATION COMPLEXITY: Low   GOALS: Goals reviewed with patient? Yes  SHORT TERM GOALS: Target date: 05/16/2024*** Lavaris will be independent with his day 1 home exercise program Baseline: Started 04/04/2024 Goal status: Met 04/25/2024  2.  Improve left knee active range of motion to 3 - 0 -100 degrees Baseline: 5 - 0 - 62 degrees Goal status: Met 04/25/2024  3.  Improve left quadriceps strength as assessed by MMT and functional testing Baseline: 2+/5 at evaluation Goal status: Ongoing   04/27/2024  4.  Braidyn will be independent with a single-point cane for ambulation outside the home Baseline: Wheeled walker full-time Goal status: Met 04/25/2024   LONG TERM GOALS: Target date: 06/27/2024  Improve patient specific functional score to at least 5 Baseline: 0.125 Goal status: Met 04/26/2024  2.  Emett will report left knee pain consistently 0-3/10 on the numeric pain rating scale Baseline: 10/10  Goal status: Ongoing   04/26/2024  3.  Improve left knee AROM to 2 - 0 - 110 degrees or better Baseline: 5 - 0 - 62 degrees Goal status: Partially Met 04/26/2024  4.  Improve left quadriceps strength as assessed by MMT and [place]'s ability to ambulate without an assistive device 100% of the time Baseline: Wheeled walker full-time Goal status: Ongoing   04/26/2024  5.  Melchor will be independent with his long-term maintenance home exercise program at discharge Baseline: Started 04/04/2024 Goal status: Ongoing   04/26/2024    PLAN:  PT FREQUENCY: 2-3x/week  PT DURATION: 12 weeks  PLANNED  INTERVENTIONS: 97750- Physical Performance Testing, 97110-Therapeutic exercises, 97530- Therapeutic activity, 97112- Neuromuscular re-education, 97535- Self Care, [id number]- Manual therapy, [phone]- Gait training, [phone]- Vasopneumatic device, Patient/Family education, Balance training, Stair training, Joint mobilization, and Cryotherapy  PLAN FOR NEXT SESSION: ***WB strengthening as able, dynamic balance improvements.   NEXT MD VISIT: 05/14/2024  Burnard Meth, PT 05/16/24  10:23 AM

## 2024-05-16 NOTE — Telephone Encounter (Signed)
 Patient called. He took last dose ASA today. He will go to [facility] for CBC today.

## 2024-05-17 ENCOUNTER — Encounter: Payer: Medicare (Managed Care) | Admitting: Rehabilitative and Restorative Service Providers"

## 2024-05-17 ENCOUNTER — Ambulatory Visit: Payer: Self-pay | Admitting: Cardiology

## 2024-05-17 ENCOUNTER — Ambulatory Visit: Payer: Medicare (Managed Care) | Admitting: Rehabilitative and Restorative Service Providers"

## 2024-05-17 ENCOUNTER — Encounter: Payer: Self-pay | Admitting: Rehabilitative and Restorative Service Providers"

## 2024-05-17 DIAGNOSIS — M6281 Muscle weakness (generalized): Secondary | ICD-10-CM | POA: Diagnosis not present

## 2024-05-17 DIAGNOSIS — R262 Difficulty in walking, not elsewhere classified: Secondary | ICD-10-CM | POA: Diagnosis not present

## 2024-05-17 DIAGNOSIS — R600 Localized edema: Secondary | ICD-10-CM

## 2024-05-17 DIAGNOSIS — M25662 Stiffness of left knee, not elsewhere classified: Secondary | ICD-10-CM

## 2024-05-17 DIAGNOSIS — M25562 Pain in left knee: Secondary | ICD-10-CM

## 2024-05-17 LAB — CBC
Hematocrit: 44.9 % (ref 37.5–51.0)
Hemoglobin: 14.5 g/dL (ref 13.0–17.7)
MCH: 28.2 pg (ref 26.6–33.0)
MCHC: 32.3 g/dL (ref 31.5–35.7)
MCV: 87 fL (ref 79–97)
Platelets: 244 x10E3/uL (ref 150–450)
RBC: 5.15 x10E6/uL (ref 4.14–5.80)
RDW: 13.2 % (ref 11.6–15.4)
WBC: 6.8 x10E3/uL (ref 3.4–10.8)

## 2024-05-17 NOTE — Therapy (Signed)
 OUTPATIENT PHYSICAL THERAPY TREATMENT   Patient Name: Richard Walker MRN: [id number] DOB:[birth year], 68 y.o., male Today's Date: 05/17/2024  END OF SESSION:  PT End of Session - 05/17/24 1235     Visit Number 15    Number of Visits 24    Date for Recertification  06/27/24    Authorization Type [employer] 20% coinsurance    Authorization - Number of Visits 33    Progress Note Due on Visit 20    PT Start Time 1147    PT Stop Time 1238    PT Time Calculation (min) 51 min    Activity Tolerance Patient tolerated treatment well;No increased pain    Behavior During Therapy WFL for tasks assessed/performed                 Past Medical History:  Diagnosis Date   Arthritis    DDD (degenerative disc disease), lumbar    L3/L4   Diverticulosis    Dysrhythmia    SVT s/p ablation   Dysrhythmia    PAF   GERD (gastroesophageal reflux disease)    Hyperlipidemia    Low sperm motility    Myocardial infarction (HCC)    Pre-diabetes    Sleep apnea    uses cpap    SVT (supraventricular tachycardia)    Past Surgical History:  Procedure Laterality Date   AMPUTATION Left 07/15/2018   Procedure: . REPAIR OF LEFT INDEX AND LONG FINGER BY FUSION /  REPAIR OF RING FINGER;  Surgeon: Sebastian Lenis, MD;  Location: [facility] OR;  Service: Orthopedics;  Laterality: Left;   BACK SURGERY     BLALOCK PROCEDURE     Ablation   CARDIAC CATHETERIZATION N/A 12/22/2015   Procedure: Left Heart Cath and Coronary Angiography;  Surgeon: Peter M Swaziland, MD;  Location: [facility] INVASIVE CV LAB;  Service: Cardiovascular;  Laterality: N/A;   CARDIAC ELECTROPHYSIOLOGY STUDY AND ABLATION     CORONARY STENT INTERVENTION N/A 05/01/2024   Procedure: CORONARY STENT INTERVENTION;  Surgeon: Ladona Heinz, MD;  Location: [facility] INVASIVE CV LAB;  Service: Cardiovascular;  Laterality: N/A;   ELECTROPHYSIOLOGIC STUDY N/A 02/19/2016   Procedure: SVT Ablation;  Surgeon: Danelle LELON Birmingham, MD;  Location[facility] INVASIVE CV LAB;  Service:  Cardiovascular;  Laterality: N/A;   HAND SURGERY     LEFT HEART CATH AND CORONARY ANGIOGRAPHY N/A 05/01/2024   Procedure: LEFT HEART CATH AND CORONARY ANGIOGRAPHY;  Surgeon: Ladona Heinz, MD;  Location: [facility] INVASIVE CV LAB;  Service: Cardiovascular;  Laterality: N/A;   septal reconstruction  2017   TOTAL KNEE ARTHROPLASTY Left 03/27/2024   Procedure: ARTHROPLASTY, KNEE, TOTAL, LEFT;  Surgeon: Addie Cordella Hamilton, MD;  Location: [facility] OR;  Service: Orthopedics;  Laterality: Left;   TURBINATE REDUCTION Bilateral 07/2016   WISDOM TOOTH EXTRACTION     Patient Active Problem List   Diagnosis Date Noted   Use of opiates for therapeutic purposes 05/09/2024   CAD (coronary artery disease) 05/01/2024   Arthritis of left knee 04/15/2024   S/P TKR (total knee replacement), left 03/29/2024   S/P total knee arthroplasty, left 03/27/2024   Constipation 02/14/2024   Metabolic dysfunction-associated steatotic liver disease (MASLD) 01/13/2024   Abnormal food appetite 11/17/2023   Insulin  resistance 11/17/2023   Class 2 severe obesity due to excess calories with serious comorbidity and body mass index (BMI) of 35.0 to 35.9 in adult 11/03/2023   Prediabetes 11/03/2023   Degenerative disc disease, lumbar 12/09/2022   Morbid obesity (HCC) 10/26/2022  Bronchitis 01/29/2022   OSA on CPAP 02/19/2021   Bite, snake 01/28/2021   Neck pain 11/13/2020   Vertigo 11/13/2020   Hypertension 11/03/2020   Contusion of bone 10/23/2020   Degenerative arthritis of knee, bilateral 10/23/2020   Concussion with no loss of consciousness 10/23/2020   Paroxysmal atrial fibrillation (HCC) 05/22/2019   Unspecified atrial flutter (HCC) 05/22/2019   Pain in right foot 03/30/2018   Perennial and seasonal allergic rhinitis 11/09/2017   History of food allergy 11/09/2017   Bronchitis, mucopurulent recurrent (HCC) 09/07/2017   SVT (supraventricular tachycardia) 04/23/2015   Abdominal pain, epigastric 06/03/2013   Vitamin D  deficiency  04/09/2013   Esophageal reflux 04/09/2013   Hepatitis B carrier (HCC) 04/09/2013   Other and unspecified hyperlipidemia 04/09/2013   Sleep apnea 04/09/2013    PCP: Dallas Maxwell, PA-C  REFERRING PROVIDER: Cordella Glendia Hutchinson, MD  REFERRING DIAG:  Diagnosis  R53.1 (ICD-10-CM) - Weakness    THERAPY DIAG:  Difficulty in walking, not elsewhere classified  Muscle weakness (generalized)  Localized edema  Acute pain of left knee  Knee stiffness, left  Rationale for Evaluation and Treatment: Rehabilitation  ONSET DATE: 03/27/2024  SUBJECTIVE:   SUBJECTIVE STATEMENT: Richard Walker is pleased with his progress thus far.  He still struggles with sleep due to left knee pain.  PERTINENT HISTORY: L3-4 lumbar DDD; pre-diabetes; previous back surgery; cardiac history  PAIN:  NPRS scale: up to 6-7/10 at night this week Pain location: Lt Knee Pain description: Ache, sore, stiff Aggravating factors: Sleeping  Relieving factors: Pain medications and ice  PRECAUTIONS: Back  RED FLAGS: None   WEIGHT BEARING RESTRICTIONS: No  FALLS:  Has patient fallen in last 6 months? No  LIVING ENVIRONMENT: Lives with: lives with their family, lives with their son, and lives with their daughter Lives in: House/apartment Stairs: Avoids stairs, walker and help for stairs now Has following equipment at home: Vannie - 2 wheeled  OCCUPATION: Retired  PLOF: Independent  PATIENT GOALS: Yard work, be more active   OBJECTIVE:  Note: Objective measures were completed at Evaluation unless otherwise noted.  DIAGNOSTIC FINDINGS: See chart  PATIENT SURVEYS:  PSFS: THE PATIENT SPECIFIC FUNCTIONAL SCALE  Place score of 0-10 (0 = unable to perform activity and 10 = able to perform activity at the same level as before injury or problem)  Activity Date: 04/04/2024 04/26/2024   Sleep 0 4   2.  Sitting 2 6   3.  Moving around 2 7   4.  Showering 1 10   Total Score 1.25  6.75     Total Score = Sum of  activity scores/number of activities  Minimally Detectable Change: 3 points (for single activity); 2 points (for average score)   COGNITION: 04/04/2024 Overall cognitive status: Within functional limits for tasks assessed     SENSATION: 04/04/2024 [facility]  EDEMA:  04/04/2024 Noted and not objectively assessed   LOWER EXTREMITY ROM:  Active ROM Left/Right 04/04/2024 Left  04/05/2024 Left 04/10/2024 04/13/24 04/17/24 Left 04/19/24 Left 04/26/2024  Knee flexion 62/145 82 with belt supine 100 Supine AROM heel slide P: 100 (guarding) Seated P: 112* Supine  A: 96* A: 114 Active 125  Knee extension -5/0 -3 supine -10 in seated LAQ AROM A: -7 (seated LAQ) LAQ A: -5* Standing A: -3* P: 0 Active lacks 3 degrees   (Blank rows = not tested)  LOWER EXTREMITY STRENGTH:  MMT Right  04/04/2024 Left 04/04/2024 Right 04/27/2024 Left 04/30/2024 Left 05/08/2024  Hip flexion  Hip extension       Hip abduction       Hip adduction       Hip internal rotation       Hip external rotation       Knee flexion       Knee extension 4+ 2+ 81.1 in pounds (236 body weight) 25.7 lbs 31,.9, 28.4 4+/5  Ankle dorsiflexion       Ankle plantarflexion       Ankle inversion       Ankle eversion        (Blank rows = not tested)  GAIT: 05/04/2024: SPC use today in ambulation in clinic.   04/30/2024: Ambulation c FWW today with improve sequencing noted compared to [facility] in last visits.   04/04/2024 Distance walked: 50 feet Assistive device utilized: Environmental consultant - 2 wheeled Level of assistance: Modified independence Comments: Richard Walker was assistive device free before surgery                   TREATMENT       DATE: 05/17/2024 Therex: Seated straight leg raises 3 sets of 5 for 3 seconds with 1# (quad set only on the left on the last set) Seated knee extension machine 90-40*, up bilateral, down left only with slow eccentric contraction 2 sets of 15  Neuromuscular re-education: Tandem balance 12 x 20 seconds (4 x head  still; 4 x head turning; 4 x eyes closed)  Functional Activities: Double Leg Press: 125# 15 x slow eccentric Single Leg Press: 62# 10 x each side slow eccentric  Vaso Left Knee High 10 minutes 34*   TREATMENT       DATE: 05/10/2024 Therex: Recumbent bike lvl 4 10 mins for ROM only, seat 7 then 6 once loosed up.  Incilne gastroc stretch 30 sec x 3  Seated LAQ Lt 5 lbs 3 x 15  Supine TKE stretch start of vaso to tolerance approx 4 mins.  TherActivity (to improve stairs, squatting, transfers Lateral step down 4 inch step WB 2 x 10 , performed bilaterally  Sit to stand to sit 18 inch table height no UE assist x 10 with slow lowering focus.    Neuro Re-ed (balance improvement Lateral step over 9 inch hurdle x 10 bilateral with light hand assist on bar SLS with step over and back 9 inch hurdle x 12 bilateral occasional HHA    Gait Training Verbal cues and visual demonstration for SPC height adjustment to prevent weight shift/lean to Rt.  Trial in clinic to address.  Discussed importance of using cane when symptoms are noted in ambulation to avoid antalgic gait impact.  Performed in clinic household distances for review throughout visit.    TREATMENT       DATE: 05/08/2024 Therex: Recumbent bike lvl 3 9 mins for ROM only, seat 6.   Standing incline gastroc stretch 30 sec x 3 Seated tailgate flexion 1 min Seated Lt knee 4 lbs 2 x 15 LAQ Seated quad set with SLR x 15 slow on Lt leg   Supine TKE stretch start of vaso to tolerance approx 3 mins.   TherActivity (to improve stairs, squatting, transfers Leg press 75 lbs slow lowering double leg x 15  (kept weight lower due to recent stents procedure) Leg press single leg 37 lbs x 15  , 31 lbs x 15 (kept weight lower due to recent stents procedure)  Vaso: Lt knee 34 deg high compression 10 mins in elevation.  TKE stretch in  supine to start    PATIENT EDUCATION:  Eval:  Education details: See above Person educated: Patient and  Child(ren) Education method: Explanation, Demonstration, Tactile cues, Verbal cues, and Handouts Education comprehension: verbalized understanding, returned demonstration, verbal cues required, tactile cues required, and needs further education  HOME EXERCISE PROGRAM: Access Code: [email or web address] URL: [email or web address] Date: 04/26/2024 Prepared by: Lamar Ivory  Exercises - Seated Knee Flexion AAROM  - 5 x daily - 7 x weekly - 1 sets - 10 reps - 10 seconds hold - Supine Quadricep Sets  - 5 x daily - 7 x weekly - 2 sets - 10 reps - 5 second hold - Small Range Straight Leg Raise  - 2 x daily - 7 x weekly - 3-5 sets - 5 reps - 3 seconds hold - Tandem Stance  - 1 x daily - 7 x weekly - 1 sets - 10 reps - 20 second hold - Single Leg Stance  - 1 x daily - 7 x weekly - 1 sets - 10 reps - 10 seconds hold - Prone Quadricep Stretch with Strap  - 2 x daily - 7 x weekly - 1 sets - 5-10 reps - 20 seconds hold - Prone Knee Extension with Ankle Weight  - 2 x daily - 7 x weekly - 1 sets - 1 reps - 3-5 minutes hold  ASSESSMENT:  CLINICAL IMPRESSION: Riel continues to make progress towards long-term goals.  I encouraged him to focus on the bike, seated straight leg raises and balance at home to avoid overuse, improve quadriceps strength and improve his ability to transition to no assistive device.   OBJECTIVE IMPAIRMENTS: Abnormal gait, decreased activity tolerance, decreased endurance, decreased knowledge of condition, difficulty walking, decreased ROM, decreased strength, increased edema, impaired perceived functional ability, and pain.   ACTIVITY LIMITATIONS: bending, sitting, standing, squatting, sleeping, stairs, transfers, bed mobility, dressing, and locomotion level  PARTICIPATION LIMITATIONS: driving, community activity, and yard work  PERSONAL FACTORS: L3-4 lumbar DDD; pre-diabetes; previous back surgery; cardiac history are also affecting patient's functional outcome.    REHAB  POTENTIAL: Good  CLINICAL DECISION MAKING: Stable/uncomplicated  EVALUATION COMPLEXITY: Low   GOALS: Goals reviewed with patient? Yes  SHORT TERM GOALS: Target date: 05/16/2024 Richard Walker will be independent with his day 1 home exercise program Baseline: Started 04/04/2024 Goal status: Met 04/25/2024  2.  Improve left knee active range of motion to 3 - 0 -100 degrees Baseline: 5 - 0 - 62 degrees Goal status: Met 04/25/2024  3.  Improve left quadriceps strength as assessed by MMT and functional testing Baseline: 2+/5 at evaluation Goal status: Ongoing   05/17/2024  4.  Richard Walker will be independent with a single-point cane for ambulation outside the home Baseline: Wheeled walker full-time Goal status: Met 04/25/2024   LONG TERM GOALS: Target date: 06/27/2024  Improve patient specific functional score to at least 5 Baseline: 0.125 Goal status: Met 04/26/2024  2.  Richard Walker will report left knee pain consistently 0-3/10 on the numeric pain rating scale Baseline: 10/10 Goal status: Ongoing   05/17/2024  3.  Improve left knee AROM to 2 - 0 - 110 degrees or better Baseline: 5 - 0 - 62 degrees Goal status: Partially Met 04/26/2024  4.  Improve left quadriceps strength as assessed by MMT and Richard Walker's ability to ambulate without an assistive device 100% of the time Baseline: Wheeled walker full-time Goal status: Ongoing   05/17/2024  5.  Richard Walker will be independent with his long-term maintenance home exercise program  at discharge Baseline: Started 04/04/2024 Goal status: Ongoing   05/17/2024    PLAN:  PT FREQUENCY: 2-3x/week  PT DURATION: 12 weeks  PLANNED INTERVENTIONS: 97750- Physical Performance Testing, 97110-Therapeutic exercises, 97530- Therapeutic activity, 97112- Neuromuscular re-education, 97535- Self Care, 02859- Manual therapy, [id number]- Gait training, [phone]- Vasopneumatic device, Patient/Family education, Balance training, Stair training, Joint mobilization, and Cryotherapy  PLAN FOR NEXT SESSION:  Quadriceps strength, transition to no assistive device, avoid overuse.  NEXT MD VISIT: 06/18/2024  Myer LELON Ivory PT, MPT 05/17/24  1:01 PM

## 2024-05-17 NOTE — Progress Notes (Signed)
 Normal CBC, his symptoms of fatigue could be related to him being in AF??? Flecainide  was stopped

## 2024-05-20 ENCOUNTER — Encounter: Payer: Self-pay | Admitting: Surgical

## 2024-05-20 NOTE — Progress Notes (Signed)
 " Cardiology Office Note    Patient Name: Richard Walker Date of Encounter: 05/20/2024  Primary Care Provider:  Dorina Loving, PA-C Primary Cardiologist:  None Primary Electrophysiologist: Danelle Birmingham, MD   Past Medical History    Past Medical History:  Diagnosis Date   Arthritis    DDD (degenerative disc disease), lumbar    L3/L4   Diverticulosis    Dysrhythmia    SVT s/p ablation   Dysrhythmia    PAF   GERD (gastroesophageal reflux disease)    Hyperlipidemia    Low sperm motility    Myocardial infarction (HCC)    Pre-diabetes    Sleep apnea    uses cpap    SVT (supraventricular tachycardia)     History of Present Illness  Richard Walker is a 68 y.o. male with a PMH of CAD s/p LHC 05/01/2024 with CTO of LAD and implant of DES x 1 with angioplasty to the mid to distal LAD, PAF (on Eliquis), and SVT s/p ablation 01/2016, HLD, asthma, OSA (on CPAP), prediabetes who presents today for post PCI follow-up.  Richard Walker has a history of SVT and underwent ablation on 01/2016 by Dr. Birmingham.  He was seen on 11/2015 with complaint of chest pain and completed LHC that showed proximal to mid LAD stenosis of 20%, ostial to proximal circumflex gnosis of 10% and otherwise nonobstructive CAD.  He was seen in follow-up on 09/2021 with complaint of shortness of breath and underwent 2D echo that showed EF of 60-65% with grade 2 DD and mild MVR.  He also completed a Lexiscan  Myoview  that was normal.  When he was seen in 11/21/2023 he had complaints of chest pain and underwent a PET stress test that showed LAD ischemia and he was referred to have an outpatient LHC.  He completed a knee replacement on 03/27/2024.  He underwent LHC on 05/01/24 and underwent successful PCI/DES to mid LAD x 2 and was placed on triple therapy with ASA, Plavix , and Eliquis.  He was advised to discontinue aspirin  after 2 weeks and continue Plavix  and Eliquis.  He contacted our office on 05/12/2024 with complaint of fatigue and  nosebleeds.  Richard Walker presents today for post PCI follow-up. He has experienced increased fatigue and shortness of breath following a recent heart catheterization. Prior to the procedure, he was able to walk longer distances without tiring quickly, but now he becomes exhausted after just two minutes on a stationary bike. He has a history of atrial fibrillation and was on Xarelto , Plavix , and aspirin . Aspirin  was stopped after two weeks.  He is currently in sinus rhythm today with no atrial fibrillation noted on EKG.  He is currently taking Xarelto  and Plavix . He also has a history of taking flecainide , which was stopped, following his heart cath.  He denies any tachycardia or palpitations since previous visit.   He experiences reflux symptoms, particularly at night when lying on his left side, describing it as a 'burning sensation' in his chest. He takes Protonix  40 mg, usually at night, and has been using it more frequently due to worsening symptoms. He also experiences a sensation of food being stuck in his throat.  The reflux symptoms started about two days ago and were particularly severe last night. He recently had knee surgery and is undergoing physical therapy. His knee is swollen, and he is less mobile, which he believes contributes to his fatigue. He is trying to increase his activity level but finds it challenging due  to his current symptoms. He mentions a past episode of a rash and swelling on his back and neck, which was treated with medication and resolved.  He has been holding losartan  since September 11.Patient denies chest pain, palpitations, dyspnea, PND, orthopnea, nausea, vomiting, dizziness, syncope, edema, weight gain, or early satiety.  Discussed the use of AI scribe software for clinical note transcription with the patient, who gave verbal consent to proceed.  History of Present Illness    Review of Systems  Please see the history of present illness.    All other systems  reviewed and are otherwise negative except as noted above.  Physical Exam    Wt Readings from Last 3 Encounters:  05/10/24 230 lb (104.3 kg)  05/09/24 226 lb (102.5 kg)  05/01/24 236 lb (107 kg)   CD:Uyzmz were no vitals filed for this visit.,There is no height or weight on file to calculate BMI. GEN: Well nourished, well developed in no acute distress Neck: No JVD; No carotid bruits Pulmonary: Clear to auscultation without rales, wheezing or rhonchi  Cardiovascular: Normal rate. Regular rhythm. Normal S1. Normal S2.  Right radial site clean dry and intact with no hematoma and minor ecchymosis that is resolving  Murmurs: There is no murmur.  ABDOMEN: Soft, non-tender, non-distended EXTREMITIES:  No edema; No deformity   EKG/LABS/ Recent Cardiac Studies   ECG personally reviewed by me today -sinus rhythm with rate of 67 bpm and no acute changes consistent with previous EKG.  Risk Assessment/Calculations:    CHA2DS2-VASc Score = 2   This indicates a 2.2% annual risk of stroke. The patient's score is based upon: CHF History: 0 HTN History: 1 Diabetes History: 0 Stroke History: 0 Vascular Disease History: 0 Age Score: 1 Gender Score: 0         Lab Results  Component Value Date   WBC 6.8 05/16/2024   HGB 14.5 05/16/2024   HCT 44.9 05/16/2024   MCV 87 05/16/2024   PLT 244 05/16/2024   Lab Results  Component Value Date   CREATININE 1.20 04/26/2024   BUN 14 04/26/2024   NA 138 04/26/2024   K 5.0 04/26/2024   CL 99 04/26/2024   CO2 24 04/26/2024   Lab Results  Component Value Date   CHOL 209 (H) 01/18/2024   HDL 42.40 01/18/2024   LDLCALC 124 (H) 01/18/2024   LDLDIRECT 150.0 03/10/2023   TRIG 212.0 (H) 01/18/2024   CHOLHDL 5 01/18/2024    Lab Results  Component Value Date   HGBA1C 5.9 01/18/2024   Assessment & Plan    Assessment & Plan  1.  Coronary artery disease: -s/p PET stress test completed on 02/21/2024 showing apical ischemia and LHC on 05/01/2024  PTCA DES x 2 to proximal and mid LAD -Patient was started on triple therapy with ASA discontinued after 2 weeks then 5 weeks and Xarelto  - Today patient reports reflux symptoms with increased fatigue since procedure. - He is currently going through physical therapy for recent knee replacement and fatigue may be related to deconditioning - He was recently started on prednisone  for rash which may be contributing to reflux. - Right radial site clean dry and intact with no evidence of hematoma - He reports ASA 81 mg discontinued 2 weeks after procedure - Continue Plavix  75 mg, Tricor  145 mg, Zetia  10 mg, Toprol -XL 50 mg daily, Nitrostat  0.4 mg, Crestor  10 mg daily  2.  Paroxysmal AF: - Flecainide  recently discontinued due to new LAD stent and patient  currently on rate control with metoprolol  25 mg twice daily which will be switched to Toprol -XL 50 mg at night due to fatigue - Today patient reports no palpitations or tachycardia since prior visit - Continue Xarelto  20 mg daily - Monitor for palpitations or fast heartbeat.  3.  History of SVT: -s/p SVT ablation by Dr. Waddell on 01/2016 and reports no recurrent tachycardia or palpitations - Continue Toprol -XL 50 mg once daily  4.  Fatigue: -Potential causes include medication side effects, recent surgery, and reduced activity. Metoprolol  may contribute to fatigue. - Switch metoprolol  to Toprol  XL once daily. - Monitor for improvement in fatigue. - Encourage gradual increase in physical activity as tolerated.  5.  Essential hypertension: - Patient's blood pressure today was stable at 98/60 -Patient advised to continue to hold losartan  25 mg daily.   - Switch metoprolol  to Toprol  XL once daily. - Provide prescription for an upper arm blood pressure cuff. - Monitor blood pressure at home.  6.Gastroesophageal reflux disease Recent reflux symptoms possibly exacerbated by medications and dietary habits. - Continue Protonix  40 mg daily - Use Tums  or Rolaids as needed for reflux symptoms. - Avoid foods high in oil or difficult to digest. - Monitor for improvement in symptoms.  Disposition: Follow-up with None or APP in 1-2 months    Signed, [name], [name], NP 05/20/2024, 11:44 AM [facility] "

## 2024-05-21 ENCOUNTER — Other Ambulatory Visit (HOSPITAL_BASED_OUTPATIENT_CLINIC_OR_DEPARTMENT_OTHER): Payer: Self-pay

## 2024-05-21 ENCOUNTER — Encounter: Payer: Self-pay | Admitting: Nurse Practitioner

## 2024-05-21 ENCOUNTER — Ambulatory Visit: Payer: Medicare (Managed Care) | Attending: Nurse Practitioner | Admitting: Nurse Practitioner

## 2024-05-21 VITALS — BP 98/60 | HR 67 | Wt 232.2 lb

## 2024-05-21 DIAGNOSIS — I471 Supraventricular tachycardia, unspecified: Secondary | ICD-10-CM | POA: Diagnosis not present

## 2024-05-21 DIAGNOSIS — I480 Paroxysmal atrial fibrillation: Secondary | ICD-10-CM

## 2024-05-21 DIAGNOSIS — K219 Gastro-esophageal reflux disease without esophagitis: Secondary | ICD-10-CM

## 2024-05-21 DIAGNOSIS — I2510 Atherosclerotic heart disease of native coronary artery without angina pectoris: Secondary | ICD-10-CM | POA: Diagnosis not present

## 2024-05-21 DIAGNOSIS — I10 Essential (primary) hypertension: Secondary | ICD-10-CM

## 2024-05-21 DIAGNOSIS — Z955 Presence of coronary angioplasty implant and graft: Secondary | ICD-10-CM | POA: Diagnosis not present

## 2024-05-21 MED ORDER — CLOPIDOGREL BISULFATE 75 MG PO TABS
75.0000 mg | ORAL_TABLET | Freq: Every day | ORAL | 3 refills | Status: DC
  Filled 2024-05-21 – 2024-05-31 (×2): qty 90, 90d supply, fill #0
  Filled 2024-08-14: qty 90, 90d supply, fill #1
  Filled ????-??-??: fill #1

## 2024-05-21 MED ORDER — OMRON 3 SERIES BP MONITOR DEVI
1.0000 | Freq: Every day | 0 refills | Status: DC
  Filled 2024-05-21: qty 1, 30d supply, fill #0

## 2024-05-21 MED ORDER — LOSARTAN POTASSIUM 25 MG PO TABS
25.0000 mg | ORAL_TABLET | Freq: Every day | ORAL | 3 refills | Status: DC
  Filled 2024-05-21: qty 90, 90d supply, fill #0

## 2024-05-21 MED ORDER — RIVAROXABAN 20 MG PO TABS: 20.0000 mg | ORAL_TABLET | Freq: Every day | ORAL | 3 refills | Status: DC

## 2024-05-21 MED ORDER — FENOFIBRATE 145 MG PO TABS
145.0000 mg | ORAL_TABLET | Freq: Every day | ORAL | 3 refills | Status: DC
  Filled 2024-05-21: qty 90, 90d supply, fill #0

## 2024-05-21 MED ORDER — BLOOD PRESSURE CUFF MISC: 0 refills | Status: DC

## 2024-05-21 MED ORDER — ROSUVASTATIN CALCIUM 10 MG PO TABS
10.0000 mg | ORAL_TABLET | Freq: Every day | ORAL | 3 refills | Status: DC
  Filled 2024-05-21 – 2024-05-31 (×2): qty 90, 90d supply, fill #0

## 2024-05-21 MED ORDER — RIVAROXABAN 20 MG PO TABS
20.0000 mg | ORAL_TABLET | Freq: Every day | ORAL | 3 refills | Status: DC
  Filled 2024-05-21: qty 30, 30d supply, fill #0
  Filled 2024-06-15: qty 30, 30d supply, fill #1
  Filled 2024-07-15: qty 30, 30d supply, fill #2
  Filled 2024-08-14: qty 30, 30d supply, fill #3

## 2024-05-21 MED ORDER — METOPROLOL TARTRATE 25 MG PO TABS
ORAL_TABLET | ORAL | 3 refills | Status: DC
  Filled 2024-05-21 – 2024-08-14 (×5): qty 270, 90d supply, fill #0
  Filled 2024-08-29: qty 270, fill #0

## 2024-05-21 MED ORDER — EZETIMIBE 10 MG PO TABS
10.0000 mg | ORAL_TABLET | Freq: Every day | ORAL | 3 refills | Status: DC
  Filled 2024-05-21 – 2024-07-17 (×4): qty 90, 90d supply, fill #0

## 2024-05-21 NOTE — Patient Instructions (Addendum)
 Thank you for choosing [facility]!     Medication Instructions:  CONTINUE TO HOLD LOSARTAN .  DISCONTINUE METOPROLOL  25MG .  START METOPROLOL  50MG . TAKE THIS TABLET ONCE A  DAY ONLY AT BEDTIME.  USE TUMS OR ROLAIDS FOR HEARTBURN.     *If you need a refill on your cardiac medications before your next appointment, please call your pharmacy*   Lab Work: No labs were ordered during today's visit.  If you have labs (blood work) drawn today and your tests are completely normal, you will receive your results only by: MyChart Message (if you have MyChart) OR A paper copy in the mail If you have any lab test that is abnormal or we need to change your treatment, we will call you to review the results.   Testing/Procedures: No procedures were ordered during today's visit.   Your next appointment:   1--2 month(s)   Provider:   DR. GANJI     Follow-Up: At [facility], you and your health needs are our priority.  As part of our continuing mission to provide you with exceptional heart care, we have created designated Provider Care Teams.  These Care Teams include your primary Cardiologist (physician) and Advanced Practice Providers (APPs -  Physician Assistants and Nurse Practitioners) who all work together to provide you with the care you need, when you need it. We recommend signing up for the patient portal called MyChart.  Sign up information is provided on this After Visit Summary.  MyChart is used to connect with patients for Virtual Visits (Telemedicine).  Patients are able to view lab/test results, encounter notes, upcoming appointments, etc.  Non-urgent messages can be sent to your provider as well.   To learn more about what you can do with MyChart, go to [email or web address].

## 2024-05-25 ENCOUNTER — Other Ambulatory Visit: Payer: Self-pay | Admitting: Surgical

## 2024-05-25 ENCOUNTER — Encounter: Payer: Self-pay | Admitting: Rehabilitative and Restorative Service Providers"

## 2024-05-25 ENCOUNTER — Ambulatory Visit (INDEPENDENT_AMBULATORY_CARE_PROVIDER_SITE_OTHER): Payer: Medicare (Managed Care) | Admitting: Gastroenterology

## 2024-05-25 ENCOUNTER — Other Ambulatory Visit (HOSPITAL_BASED_OUTPATIENT_CLINIC_OR_DEPARTMENT_OTHER): Payer: Self-pay

## 2024-05-25 ENCOUNTER — Ambulatory Visit (INDEPENDENT_AMBULATORY_CARE_PROVIDER_SITE_OTHER): Payer: Medicare (Managed Care) | Admitting: Rehabilitative and Restorative Service Providers"

## 2024-05-25 ENCOUNTER — Telehealth: Payer: Self-pay | Admitting: Orthopedic Surgery

## 2024-05-25 ENCOUNTER — Encounter: Payer: Self-pay | Admitting: Gastroenterology

## 2024-05-25 VITALS — BP 118/72 | HR 81 | Ht 68.0 in | Wt 225.0 lb

## 2024-05-25 DIAGNOSIS — K219 Gastro-esophageal reflux disease without esophagitis: Secondary | ICD-10-CM | POA: Diagnosis not present

## 2024-05-25 DIAGNOSIS — M25562 Pain in left knee: Secondary | ICD-10-CM

## 2024-05-25 DIAGNOSIS — R262 Difficulty in walking, not elsewhere classified: Secondary | ICD-10-CM

## 2024-05-25 DIAGNOSIS — R600 Localized edema: Secondary | ICD-10-CM

## 2024-05-25 DIAGNOSIS — M25662 Stiffness of left knee, not elsewhere classified: Secondary | ICD-10-CM

## 2024-05-25 DIAGNOSIS — M6281 Muscle weakness (generalized): Secondary | ICD-10-CM | POA: Diagnosis not present

## 2024-05-25 DIAGNOSIS — Z1211 Encounter for screening for malignant neoplasm of colon: Secondary | ICD-10-CM

## 2024-05-25 DIAGNOSIS — K5900 Constipation, unspecified: Secondary | ICD-10-CM

## 2024-05-25 MED ORDER — OXYCODONE HCL 5 MG PO TABS
5.0000 mg | ORAL_TABLET | Freq: Three times a day (TID) | ORAL | 0 refills | Status: DC | PRN
  Filled 2024-05-25: qty 30, 10d supply, fill #0

## 2024-05-25 NOTE — Patient Instructions (Signed)
 Follow up in December or sooner if needed.   Thank you for trusting me with your gastrointestinal care!   Camie Furbish, PA-C  _______________________________________________________  If your blood pressure at your visit was 140/90 or greater, please contact your primary care physician to follow up on this.  _______________________________________________________  If you are age 68 or older, your body mass index should be between 23-30. Your Body mass index is 34.21 kg/m. If this is out of the aforementioned range listed, please consider follow up with your Primary Care Provider.  If you are age 19 or younger, your body mass index should be between 19-25. Your Body mass index is 34.21 kg/m. If this is out of the aformentioned range listed, please consider follow up with your Primary Care Provider.   ________________________________________________________  The [facility] providers would like to encourage you to use MYCHART to communicate with providers for non-urgent requests or questions.  Due to long hold times on the telephone, sending your provider a message by [facility] may be a faster and more efficient way to get a response.  Please allow 48 business hours for a response.  Please remember that this is for non-urgent requests.  _______________________________________________________  [facility] is using a team-based approach to care.  Your team is made up of your doctor and two to three APPS. Our APPS (Nurse Practitioners and Physician Assistants) work with your physician to ensure care continuity for you. They are fully qualified to address your health concerns and develop a treatment plan. They communicate directly with your gastroenterologist to care for you. Seeing the Advanced Practice Practitioners on your physician's team can help you by facilitating care more promptly, often allowing for earlier appointments, access to diagnostic testing, procedures, and other specialty  referrals.

## 2024-05-25 NOTE — Telephone Encounter (Signed)
 Sent in q8h to wean down

## 2024-05-25 NOTE — Telephone Encounter (Signed)
 Patient came in and needs a refill on Oxycodone . CB#[phone]

## 2024-05-25 NOTE — Progress Notes (Signed)
 Richard Walker [id number] Oct 31, 1955   Chief Complaint: Discuss colonoscopy  Referring Provider: Dorina Loving, PA-C Primary GI MD: Dr. Legrand  HPI: Richard Walker is a 68 y.o. male with past medical history of arthritis, diverticulosis, SVT s/p ablation, paroxysmal A-fib, prior MI, GERD, HLD, OSA on CPAP who presents today to discuss colonoscopy.    Seen in office 05/20/2017 by Delon Failing, PA for GERD.  Symptoms ongoing for a year at that time and somewhat controlled on Nexium  but had had increasing symptoms including chest pressure with negative evaluation by cardiology and suggestive of reflux.  He was started on pantoprazole  40 mg twice daily and advised to continue Pepcid  twice a day as well.  Underwent EGD 06/03/2017 which was normal.  Advised to discontinue Pepcid  but continue pantoprazole .  Seen in follow-up 07/28/2017 by Dr. Legrand.  Advised to wean off PPI as it was not helping much and see if there is a change.  Advised to closely follow-up with primary care and cardiology.  No indication for further GI testing at that time.  Recently underwent coronary stent placement 05/01/2024.  Successful PCI/DES x 2 to mLAD.  Plan at that time was for triple therapy with aspirin /Plavix /Eliquis for at least 2 weeks then drop aspirin  with continuation of Plavix /Eliquis.  Contacted cardiology office 05/12/2024 with complaint of fatigue and nosebleeds.  Last visit with cardiology 05/21/2024.  Having increased fatigue and shortness of breath following recent heart catheterization, on Xarelto  and Plavix .   Last colonoscopy 2014 with 10-year recall, report unavailable but letter to patient indicates he did not have any polyps but did have diverticulosis.  On Protonix  40 mg daily for GERD.    Patient states he just finished aspirin  and now is continuing on Xarelto  and Plavix .  States that in April of this year he started feeling very fatigued.  Had a stress test in July, catheterization in  September.  Since catheterization he is actually feeling worse.  Having increased fatigue and shortness of breath.  Saw cardiology and had some changes made to his medications.  Has a follow-up with cardiology scheduled for December 3.  Had left knee replacement surgery in August and walks with a cane.  Has been on oxycodone  following surgery for pain.  Also started Zepbound  about 3 months ago which has been helping with weight loss.  Between Zepbound  and oxycodone  he has developed some constipation, which is improved with OTC stool softener.  Previously was having a bowel movement once a day, now having a bowel movement every 2 to 3 days.  Denies any blood in his stool or melena.  Has occasional abdominal pain since starting Zepbound  but no severe pain.  Denies any family history of colon cancer or colon polyps.  GERD controlled on pantoprazole  40 mg daily.  Previous GI Procedures/Imaging   EGD 06/03/2017 - Normal larynx.  - Normal esophagus.  - Normal stomach.  - Normal examined duodenum.  - No specimens collected.  Past Medical History:  Diagnosis Date   Arthritis    DDD (degenerative disc disease), lumbar    L3/L4   Diverticulosis    Dysrhythmia    SVT s/p ablation   Dysrhythmia    PAF   GERD (gastroesophageal reflux disease)    Hyperlipidemia    Low sperm motility    Myocardial infarction (HCC)    Pre-diabetes    Sleep apnea    uses cpap    SVT (supraventricular tachycardia)     Past Surgical History:  Procedure Laterality Date   AMPUTATION Left 07/15/2018   Procedure: . REPAIR OF LEFT INDEX AND LONG FINGER BY FUSION /  REPAIR OF RING FINGER;  Surgeon: Sebastian Lenis, MD;  Location[facility] OR;  Service: Orthopedics;  Laterality: Left;   BACK SURGERY     BLALOCK PROCEDURE     Ablation   CARDIAC CATHETERIZATION N/A 12/22/2015   Procedure: Left Heart Cath and Coronary Angiography;  Surgeon: Peter M Swaziland, MD;  Location: [facility] INVASIVE CV LAB;  Service: Cardiovascular;   Laterality: N/A;   CARDIAC ELECTROPHYSIOLOGY STUDY AND ABLATION     CORONARY STENT INTERVENTION N/A 05/01/2024   Procedure: CORONARY STENT INTERVENTION;  Surgeon: Ladona Mihran Lebarron, MD;  Location: [facility] INVASIVE CV LAB;  Service: Cardiovascular;  Laterality: N/A;   ELECTROPHYSIOLOGIC STUDY N/A 02/19/2016   Procedure: SVT Ablation;  Surgeon: Danelle LELON Birmingham, MD;  Location: [facility] INVASIVE CV LAB;  Service: Cardiovascular;  Laterality: N/A;   HAND SURGERY     LEFT HEART CATH AND CORONARY ANGIOGRAPHY N/A 05/01/2024   Procedure: LEFT HEART CATH AND CORONARY ANGIOGRAPHY;  Surgeon: Ladona Tammela Bales, MD;  Location: [facility] INVASIVE CV LAB;  Service: Cardiovascular;  Laterality: N/A;   septal reconstruction  2017   TOTAL KNEE ARTHROPLASTY Left 03/27/2024   Procedure: ARTHROPLASTY, KNEE, TOTAL, LEFT;  Surgeon: Addie Cordella Hamilton, MD;  Location: [facility] OR;  Service: Orthopedics;  Laterality: Left;   TURBINATE REDUCTION Bilateral 07/2016   WISDOM TOOTH EXTRACTION      Current Outpatient Medications  Medication Sig Dispense Refill   albuterol  (PROAIR  HFA) 108 (90 Base) MCG/ACT inhaler Inhale 1-2 puffs into the lungs every 6 (six) hours as needed for wheezing or shortness of breath. 24 g 3   aspirin  EC 81 MG tablet Take 1 tablet (81 mg total) by mouth daily. Swallow whole. 15 tablet 0   azelastine  (ASTELIN ) 0.1 % nasal spray 1-2 sprays per nostril 2 times daily as needed. 90 mL 3   Blood Pressure Monitoring (BLOOD PRESSURE CUFF) MISC MONITOR BLOOD PRESSURE ONCE DAILY GOAL OF 130/80 1 each 0   budesonide -glycopyrrolate -formoterol  (BREZTRI  AEROSPHERE) 160-9-4.8 MCG/ACT AERO inhaler Inhale 2 puffs into the lungs in the morning and at bedtime. 10.7 g 11   cholecalciferol  (VITAMIN D3) 25 MCG (1000 UNIT) tablet Take 4,000 Units by mouth daily.     clopidogrel  (PLAVIX ) 75 MG tablet Take 1 tablet (75 mg total) by mouth daily. 90 tablet 3   docusate sodium  (COLACE) 100 MG capsule Take 1 capsule (100 mg total) by mouth 2 (two) times daily. 30  capsule 0   ezetimibe  (ZETIA ) 10 MG tablet Take 1 tablet (10 mg total) by mouth daily. 90 tablet 3   fenofibrate  (TRICOR ) 145 MG tablet Take 1 tablet (145 mg total) by mouth daily. 90 tablet 3   gabapentin  (NEURONTIN ) 100 MG capsule Take 1 capsule (100 mg total) by mouth 2 (two) times daily. 42 capsule 0   hydrOXYzine (ATARAX) 25 MG tablet Take 25 mg by mouth 3 (three) times daily.     losartan  (COZAAR ) 25 MG tablet Take 1 tablet (25 mg total) by mouth daily. 90 tablet 3   Magnesium  250 MG TABS Take 250 mg by mouth once a week.     metFORMIN  (GLUCOPHAGE ) 500 MG tablet TAKE 1 TABLET(500 MG) BY MOUTH TWICE DAILY WITH A MEAL 180 tablet 3   methocarbamol  (ROBAXIN ) 500 MG tablet Take 1 tablet (500 mg total) by mouth every 8 (eight) hours as needed for muscle spasms. 30 tablet 0  metoprolol  tartrate (LOPRESSOR ) 25 MG tablet Take one tablet by mouth twice a day.  May take one additional tablet by mouth as needed for breakthrough palpitations. 270 tablet 3   nitroGLYCERIN  (NITROSTAT ) 0.4 MG SL tablet Place 1 tablet (0.4 mg total) under the tongue every 5 (five) minutes as needed for up to 25 days for chest pain. Do not use for 24 hours after use of Viagra  25 tablet 0   oxyCODONE  (ROXICODONE ) 5 MG immediate release tablet Take 1 tablet (5 mg total) by mouth every 6 (six) hours as needed for severe pain (pain score 7-10). 30 tablet 0   pantoprazole  (PROTONIX ) 40 MG tablet Take 1 tablet (40 mg total) by mouth daily. 30 tablet 11   PRESCRIPTION MEDICATION Inhale into the lungs at bedtime. CPAP     rivaroxaban  (XARELTO ) 20 MG TABS tablet Take 1 tablet (20 mg total) by mouth daily with supper. 30 tablet 3   rosuvastatin  (CRESTOR ) 10 MG tablet Take 1 tablet (10 mg total) by mouth daily. 90 tablet 3   sildenafil  (VIAGRA ) 100 MG tablet Take 1 tablet (100 mg total) by mouth daily as needed for erectile dysfunction. 30 tablet 3   tirzepatide  (ZEPBOUND ) 7.5 MG/0.5ML Pen Inject 7.5 mg into the skin once a week. 2 mL 0    Vitamin D , Ergocalciferol , (DRISDOL ) 1.25 MG (50000 UNIT) CAPS capsule Take 1 capsule (50,000 Units total) by mouth every 7 (seven) days. 8 capsule 3   No current facility-administered medications for this visit.    Allergies as of 05/25/2024 - Review Complete 05/25/2024  Allergen Reaction Noted   Dust mite extract  09/08/2021   Statins  01/08/2016   Tree extract Other (See Comments) 12/28/2017    Family History  Problem Relation Age of Onset   Stroke Mother    Heart disease Mother    High Cholesterol Mother    Sleep apnea Father    Hypertension Father    Food Allergy Son    Colon cancer Neg Hx    Stomach cancer Neg Hx    Allergic rhinitis Neg Hx    Angioedema Neg Hx    Asthma Neg Hx    Eczema Neg Hx    Immunodeficiency Neg Hx    Urticaria Neg Hx     Social History   Tobacco Use   Smoking status: Former    Current packs/day: 0.00    Average packs/day: 1 pack/day for 10.0 years (10.0 ttl pk-yrs)    Types: Cigarettes    Start date: 68    Quit date: 61    Years since quitting: 33.7   Smokeless tobacco: Never   Tobacco comments:    Uses [employer] occasionally  Vaping Use   Vaping status: Never Used  Substance Use Topics   Alcohol use: No   Drug use: No     Review of Systems:    Constitutional: No unintentional weight loss, fever, chills.  Positive fatigue Cardiovascular: No chest pain Respiratory: Shortness of breath, no cough Gastrointestinal: See HPI and otherwise negative   Physical Exam:  Vital signs: BP 118/72   Pulse 81   Ht 5' 8 (1.727 m)   Wt 225 lb (102.1 kg)   BMI 34.21 kg/m   Constitutional: Pleasant, obese male in NAD, alert and cooperative, ambulates with a cane s/p left knee replacement Head:  Normocephalic and atraumatic.  Eyes: No scleral icterus.  Respiratory: Respirations even and unlabored. Lungs clear to auscultation bilaterally.  No wheezes, crackles, or rhonchi.  Cardiovascular:  Regular rate and rhythm. No murmurs. No  peripheral edema. Gastrointestinal:  Soft, obese abdomen, nontender. No rebound or guarding. Normal bowel sounds. No appreciable masses or hepatomegaly. Rectal:  Not performed.  Neurologic:  Alert and oriented x4;  grossly normal neurologically.  Skin:   Dry and intact without significant lesions or rashes. Psychiatric: Oriented to person, place and time. Demonstrates good judgement and reason without abnormal affect or behaviors.   RELEVANT LABS AND IMAGING: CBC    Component Value Date/Time   WBC 6.8 05/16/2024 1515   WBC 6.7 03/20/2024 1450   RBC 5.15 05/16/2024 1515   RBC 5.67 03/20/2024 1450   HGB 14.5 05/16/2024 1515   HCT 44.9 05/16/2024 1515   PLT 244 05/16/2024 1515   MCV 87 05/16/2024 1515   MCH 28.2 05/16/2024 1515   MCH 28.6 03/20/2024 1450   MCHC 32.3 05/16/2024 1515   MCHC 33.3 03/20/2024 1450   RDW 13.2 05/16/2024 1515   LYMPHSABS 1.7 01/20/2024 1057   MONOABS 0.5 01/20/2024 1057   EOSABS 0.1 01/20/2024 1057   BASOSABS 0.0 01/20/2024 1057    CMP     Component Value Date/Time   NA 138 04/26/2024 1232   K 5.0 04/26/2024 1232   CL 99 04/26/2024 1232   CO2 24 04/26/2024 1232   GLUCOSE 87 04/26/2024 1232   GLUCOSE 98 03/20/2024 1450   BUN 14 04/26/2024 1232   CREATININE 1.20 04/26/2024 1232   CREATININE 1.18 02/12/2016 1508   CALCIUM  10.0 04/26/2024 1232   PROT 6.9 01/18/2024 0935   PROT 6.9 11/03/2023 1117   ALBUMIN 4.3 01/18/2024 0935   ALBUMIN 4.3 11/03/2023 1117   AST 16 01/18/2024 0935   ALT 23 01/18/2024 0935   ALKPHOS 83 01/18/2024 0935   BILITOT 0.4 01/18/2024 0935   BILITOT 0.3 11/03/2023 1117   GFRNONAA >60 03/20/2024 1450   GFRNONAA >89 03/04/2014 1456   GFRAA >60 07/15/2018 1324   GFRAA >89 03/04/2014 1456   Echocardiogram 10/29/2021 1. Left ventricular ejection fraction, by estimation, is 60 to 65% . The left ventricle has normal function. The left ventricle has no regional wall motion abnormalities. Left ventricular diastolic parameters  are consistent with Grade II diastolic dysfunction ( pseudonormalization) . The average left ventricular global longitudinal strain is 21. 6 % . The global longitudinal strain is normal.  2. Right ventricular systolic function is normal. The right ventricular size is normal. There is normal pulmonary artery systolic pressure.  3. Left atrial size was mildly dilated. 4. The mitral valve is normal in structure. Mild mitral valve regurgitation. No evidence of mitral stenosis.  5. The aortic valve is normal in structure. Aortic valve regurgitation is not visualized. No aortic stenosis is present.  6. Aortic dilatation noted. There is mild dilatation of the ascending aorta, measuring 41 mm.  7. The inferior vena cava is normal in size with greater than 50% respiratory variability, suggesting right atrial pressure of 3 mmHg.  Assessment/Plan:   Screening for colon cancer Constipation Patient seen today to discuss screening colonoscopy.  Last colonoscopy 2014 with 10-year recall, report unavailable but letter to patient indicates he did not have any polyps but did have diverticulosis.  He has recently been having some constipation since starting Zepbound  3 months ago as well as oxycodone  following knee replacement surgery in August.  Symptoms relieved with OTC stool softeners.  No rectal bleeding or melena, no significant abdominal pain. He recently underwent coronary stent placement 05/01/2021 and is on Xarelto  and  Plavix .  States that following catheterization he is actually having increased fatigue and shortness of breath.  Did have a follow-up with cardiology 05/21/2024, some changes were made to his medications, and he has follow-up with them in December.  Based on recent cardiac stenting and worsening fatigue and shortness of breath, will hold off on scheduling screening colonoscopy at this time.  Will plan to have him follow-up in December and reevaluate at that time.  - Continue stool softeners as  needed for constipation - Follow up in December to discuss scheduling colonoscopy  GERD Controlled on Protonix .  - Continue Protonix  40 mg daily   Camie Furbish, PA-C [facility] 05/25/2024, 2:46 PM  Patient Care Team: Saguier, Edward, PA-C as PCP - General (Internal Medicine) Waddell Danelle ORN, MD as PCP - Electrophysiology (Cardiology) Floy Lynwood PARAS, MD (Inactive) as Consulting Physician (Otolaryngology)

## 2024-05-25 NOTE — Therapy (Addendum)
 OUTPATIENT PHYSICAL THERAPY TREATMENT   Patient Name: Richard Walker MRN: [id number] DOB:Jan 29, 1956, 68 y.o., male Today's Date: 05/25/2024  END OF SESSION:  PT End of Session - 05/25/24 1023     Visit Number 16    Number of Visits 24    Date for Recertification  06/27/24    Authorization Type [employer] 20% coinsurance    Authorization - Number of Visits 33    Progress Note Due on Visit 20    PT Start Time 1015    PT Stop Time 1055    PT Time Calculation (min) 40 min    Activity Tolerance Patient tolerated treatment well    Behavior During Therapy WFL for tasks assessed/performed                  Past Medical History:  Diagnosis Date   Arthritis    DDD (degenerative disc disease), lumbar    L3/L4   Diverticulosis    Dysrhythmia    SVT s/p ablation   Dysrhythmia    PAF   GERD (gastroesophageal reflux disease)    Hyperlipidemia    Low sperm motility    Myocardial infarction (HCC)    Pre-diabetes    Sleep apnea    uses cpap    SVT (supraventricular tachycardia)    Past Surgical History:  Procedure Laterality Date   AMPUTATION Left 07/15/2018   Procedure: . REPAIR OF LEFT INDEX AND LONG FINGER BY FUSION /  REPAIR OF RING FINGER;  Surgeon: Sebastian Lenis, MD;  Location: [facility] OR;  Service: Orthopedics;  Laterality: Left;   BACK SURGERY     BLALOCK PROCEDURE     Ablation   CARDIAC CATHETERIZATION N/A 12/22/2015   Procedure: Left Heart Cath and Coronary Angiography;  Surgeon: Peter M Swaziland, MD;  Location: [facility] INVASIVE CV LAB;  Service: Cardiovascular;  Laterality: N/A;   CARDIAC ELECTROPHYSIOLOGY STUDY AND ABLATION     CORONARY STENT INTERVENTION N/A 05/01/2024   Procedure: CORONARY STENT INTERVENTION;  Surgeon: Ladona Heinz, MD;  Location: [facility] INVASIVE CV LAB;  Service: Cardiovascular;  Laterality: N/A;   ELECTROPHYSIOLOGIC STUDY N/A 02/19/2016   Procedure: SVT Ablation;  Surgeon: Danelle LELON Birmingham, MD;  Location: [facility] INVASIVE CV LAB;  Service: Cardiovascular;   Laterality: N/A;   HAND SURGERY     LEFT HEART CATH AND CORONARY ANGIOGRAPHY N/A 05/01/2024   Procedure: LEFT HEART CATH AND CORONARY ANGIOGRAPHY;  Surgeon: Ladona Heinz, MD;  Location: [facility] INVASIVE CV LAB;  Service: Cardiovascular;  Laterality: N/A;   septal reconstruction  2017   TOTAL KNEE ARTHROPLASTY Left 03/27/2024   Procedure: ARTHROPLASTY, KNEE, TOTAL, LEFT;  Surgeon: Addie Cordella Hamilton, MD;  Location: [facility] OR;  Service: Orthopedics;  Laterality: Left;   TURBINATE REDUCTION Bilateral 07/2016   WISDOM TOOTH EXTRACTION     Patient Active Problem List   Diagnosis Date Noted   Use of opiates for therapeutic purposes 05/09/2024   CAD (coronary artery disease) 05/01/2024   Arthritis of left knee 04/15/2024   S/P TKR (total knee replacement), left 03/29/2024   S/P total knee arthroplasty, left 03/27/2024   Constipation 02/14/2024   Metabolic dysfunction-associated steatotic liver disease (MASLD) 01/13/2024   Abnormal food appetite 11/17/2023   Insulin  resistance 11/17/2023   Class 2 severe obesity due to excess calories with serious comorbidity and body mass index (BMI) of 35.0 to 35.9 in adult 11/03/2023   Prediabetes 11/03/2023   Degenerative disc disease, lumbar 12/09/2022   Morbid obesity (HCC) 10/26/2022   Bronchitis  01/29/2022   OSA on CPAP 02/19/2021   Bite, snake 01/28/2021   Neck pain 11/13/2020   Vertigo 11/13/2020   Hypertension 11/03/2020   Contusion of bone 10/23/2020   Degenerative arthritis of knee, bilateral 10/23/2020   Concussion with no loss of consciousness 10/23/2020   Paroxysmal atrial fibrillation (HCC) 05/22/2019   Unspecified atrial flutter (HCC) 05/22/2019   Pain in right foot 03/30/2018   Perennial and seasonal allergic rhinitis 11/09/2017   History of food allergy 11/09/2017   Bronchitis, mucopurulent recurrent (HCC) 09/07/2017   SVT (supraventricular tachycardia) 04/23/2015   Abdominal pain, epigastric 06/03/2013   Vitamin D  deficiency 04/09/2013    Esophageal reflux 04/09/2013   Hepatitis B carrier (HCC) 04/09/2013   Other and unspecified hyperlipidemia 04/09/2013   Sleep apnea 04/09/2013    PCP: Dallas Maxwell, PA-C  REFERRING PROVIDER: Cordella Glendia Hutchinson, MD  REFERRING DIAG:  Diagnosis  R53.1 (ICD-10-CM) - Weakness    THERAPY DIAG:  Difficulty in walking, not elsewhere classified  Muscle weakness (generalized)  Localized edema  Acute pain of left knee  Knee stiffness, left  Rationale for Evaluation and Treatment: Rehabilitation  ONSET DATE: 03/27/2024  SUBJECTIVE:   SUBJECTIVE STATEMENT: Pt indicated feeling 6/10 or so normal life  Pt indicated a shooting pain from lateral mid thigh to knee up t0 8/10.  Reported massage helps that area.   PERTINENT HISTORY: L3-4 lumbar DDD; pre-diabetes; previous back surgery; cardiac history  PAIN:  NPRS scale: 6/10 today.  Pain location: Lt Knee Pain description: Ache, sore, stiff Aggravating factors: Sleeping  Relieving factors: Pain medications and ice  PRECAUTIONS: Back  RED FLAGS: None   WEIGHT BEARING RESTRICTIONS: No  FALLS:  Has patient fallen in last 6 months? No  LIVING ENVIRONMENT: Lives with: lives with their family, lives with their son, and lives with their daughter Lives in: House/apartment Stairs: Avoids stairs, walker and help for stairs now Has following equipment at home: Vannie - 2 wheeled  OCCUPATION: Retired  PLOF: Independent  PATIENT GOALS: Yard work, be more active   OBJECTIVE:  Note: Objective measures were completed at Evaluation unless otherwise noted.  DIAGNOSTIC FINDINGS: See chart  PATIENT SURVEYS:  PSFS: THE PATIENT SPECIFIC FUNCTIONAL SCALE  Place score of 0-10 (0 = unable to perform activity and 10 = able to perform activity at the same level as before injury or problem)  Activity Date: 04/04/2024 04/26/2024   Sleep 0 4   2.  Sitting 2 6   3.  Moving around 2 7   4.  Showering 1 10   Total Score 1.25  6.75      Total Score = Sum of activity scores/number of activities  Minimally Detectable Change: 3 points (for single activity); 2 points (for average score)   COGNITION: 04/04/2024 Overall cognitive status: Within functional limits for tasks assessed     SENSATION: 04/04/2024 [facility]  EDEMA:  04/04/2024 Noted and not objectively assessed   LOWER EXTREMITY ROM:  Active ROM Left/Right 04/04/2024 Left  04/05/2024 Left 04/10/2024 04/13/24 04/17/24 Left 04/19/24 Left 04/26/2024 Left 05/25/2024  Knee flexion 62/145 82 with belt supine 100 Supine AROM heel slide P: 100 (guarding) Seated P: 112* Supine  A: 96* A: 114 Active 125   Knee extension -5/0 -3 supine -10 in seated LAQ AROM A: -7 (seated LAQ) LAQ A: -5* Standing A: -3* P: 0 Active lacks 3 degrees -2 seated LAQ Lt    (Blank rows = not tested)  LOWER EXTREMITY STRENGTH:  MMT Right  04/04/2024 Left 04/04/2024 Right 04/27/2024 Left 04/30/2024 Left 05/08/2024 Left 05/25/2024  Hip flexion        Hip extension        Hip abduction        Hip adduction        Hip internal rotation        Hip external rotation        Knee flexion        Knee extension 4+ 2+ 81.1 in pounds (236 body weight) 25.7 lbs 31,.9, 28.4 4+/5 52, 48 lbs 5/5  Ankle dorsiflexion        Ankle plantarflexion        Ankle inversion        Ankle eversion         (Blank rows = not tested)  GAIT: 05/04/2024: SPC use today in ambulation in clinic.   04/30/2024: Ambulation c FWW today with improve sequencing noted compared to [facility] in last visits.   04/04/2024 Distance walked: 50 feet Assistive device utilized: Environmental consultant - 2 wheeled Level of assistance: Modified independence Comments: Toddrick was assistive device free before surgery                    TREATMENT       DATE: 05/25/2024 Therex: Recumbent bike seat 5 lvl 5 8 mins.  Knee extension machine double leg up, single leg lowering Lt 3 x 10 10 lbs (in available painfree range) Incline gastroc stretch 30 sec x 5  bilateral  Supine TKE stretch verbal review Seated tailgate flexion x 15 for mobility after extension stretch.  TherActivity (to improve stairs, squatting, transfers Leg press double leg 125 lbs slow lowering x 20 Leg press single leg Lt slow lowering 2 x 15 62 lbs    TREATMENT       DATE: 05/17/2024 Therex: Seated straight leg raises 3 sets of 5 for 3 seconds with 1# (quad set only on the left on the last set) Seated knee extension machine 90-40*, up bilateral, down left only with slow eccentric contraction 2 sets of 15  Neuromuscular re-education: Tandem balance 12 x 20 seconds (4 x head still; 4 x head turning; 4 x eyes closed)  Functional Activities: Double Leg Press: 125# 15 x slow eccentric Single Leg Press: 62# 10 x each side slow eccentric  Vaso Left Knee High 10 minutes 34*   TREATMENT       DATE: 05/10/2024 Therex: Recumbent bike lvl 4 10 mins for ROM only, seat 7 then 6 once loosed up.  Incilne gastroc stretch 30 sec x 3  Seated LAQ Lt 5 lbs 3 x 15  Supine TKE stretch start of vaso to tolerance approx 4 mins.  TherActivity (to improve stairs, squatting, transfers Lateral step down 4 inch step WB 2 x 10 , performed bilaterally  Sit to stand to sit 18 inch table height no UE assist x 10 with slow lowering focus.    Neuro Re-ed (balance improvement Lateral step over 9 inch hurdle x 10 bilateral with light hand assist on bar SLS with step over and back 9 inch hurdle x 12 bilateral occasional HHA    Gait Training Verbal cues and visual demonstration for SPC height adjustment to prevent weight shift/lean to Rt.  Trial in clinic to address.  Discussed importance of using cane when symptoms are noted in ambulation to avoid antalgic gait impact.  Performed in clinic household distances for review throughout visit.    PATIENT EDUCATION:  Eval:  Education details: See above Person educated: Patient and Child(ren) Education method: Explanation, Demonstration, Tactile  cues, Verbal cues, and Handouts Education comprehension: verbalized understanding, returned demonstration, verbal cues required, tactile cues required, and needs further education  HOME EXERCISE PROGRAM: Access Code: [email or web address] URL: [email or web address] Date: 04/26/2024 Prepared by: Lamar Ivory  Exercises - Seated Knee Flexion AAROM  - 5 x daily - 7 x weekly - 1 sets - 10 reps - 10 seconds hold - Supine Quadricep Sets  - 5 x daily - 7 x weekly - 2 sets - 10 reps - 5 second hold - Small Range Straight Leg Raise  - 2 x daily - 7 x weekly - 3-5 sets - 5 reps - 3 seconds hold - Tandem Stance  - 1 x daily - 7 x weekly - 1 sets - 10 reps - 20 second hold - Single Leg Stance  - 1 x daily - 7 x weekly - 1 sets - 10 reps - 10 seconds hold - Prone Quadricep Stretch with Strap  - 2 x daily - 7 x weekly - 1 sets - 5-10 reps - 20 seconds hold - Prone Knee Extension with Ankle Weight  - 2 x daily - 7 x weekly - 1 sets - 1 reps - 3-5 minutes hold  ASSESSMENT:  CLINICAL IMPRESSION: Continued focus on progressive strengthening to continue to help improve WB control and acceptance on Lt leg.  See objective data for measurement update.  Strength showed big gain.    OBJECTIVE IMPAIRMENTS: Abnormal gait, decreased activity tolerance, decreased endurance, decreased knowledge of condition, difficulty walking, decreased ROM, decreased strength, increased edema, impaired perceived functional ability, and pain.   ACTIVITY LIMITATIONS: bending, sitting, standing, squatting, sleeping, stairs, transfers, bed mobility, dressing, and locomotion level  PARTICIPATION LIMITATIONS: driving, community activity, and yard work  PERSONAL FACTORS: L3-4 lumbar DDD; pre-diabetes; previous back surgery; cardiac history are also affecting patient's functional outcome.    REHAB POTENTIAL: Good  CLINICAL DECISION MAKING: Stable/uncomplicated  EVALUATION COMPLEXITY: Low   GOALS: Goals reviewed with patient?  Yes  SHORT TERM GOALS: Target date: 05/16/2024 Tyreck will be independent with his day 1 home exercise program Baseline: Started 04/04/2024 Goal status: Met 04/25/2024  2.  Improve left knee active range of motion to 3 - 0 -100 degrees Baseline: 5 - 0 - 62 degrees Goal status: Met 04/25/2024  3.  Improve left quadriceps strength as assessed by MMT and functional testing Baseline: 2+/5 at evaluation Goal status: Ongoing   05/17/2024  4.  Stella will be independent with a single-point cane for ambulation outside the home Baseline: Wheeled walker full-time Goal status: Met 04/25/2024   LONG TERM GOALS: Target date: 06/27/2024  Improve patient specific functional score to at least 5 Baseline: 0.125 Goal status: Met 04/26/2024  2.  Harlow will report left knee pain consistently 0-3/10 on the numeric pain rating scale Baseline: 10/10 Goal status: Ongoing   05/17/2024  3.  Improve left knee AROM to 2 - 0 - 110 degrees or better Baseline: 5 - 0 - 62 degrees Goal status: Partially Met 04/26/2024  4.  Improve left quadriceps strength as assessed by MMT and Logon's ability to ambulate without an assistive device 100% of the time Baseline: Wheeled walker full-time Goal status: Ongoing   05/17/2024  5.  Santiel will be independent with his long-term maintenance home exercise program at discharge Baseline: Started 04/04/2024 Goal status: Ongoing   05/17/2024    PLAN:  PT FREQUENCY: 2-3x/week  PT DURATION:  12 weeks  PLANNED INTERVENTIONS: 97750- Physical Performance Testing, 97110-Therapeutic exercises, 97530- Therapeutic activity, [id number]- Neuromuscular re-education, 97535- Self Care, [id number]- Manual therapy, [phone]- Gait training, [phone]- Vasopneumatic device, Patient/Family education, Balance training, Stair training, Joint mobilization, and Cryotherapy  PLAN FOR NEXT SESSION: Continued focus on quad strengthening (OKC, CKC) with extension gains.   NEXT MD VISIT: 06/18/2024   Ozell Silvan, PT, DPT, OCS,  ATC 05/25/24  11:00 AM

## 2024-05-27 ENCOUNTER — Encounter: Payer: Self-pay | Admitting: Gastroenterology

## 2024-05-28 ENCOUNTER — Encounter: Payer: Self-pay | Admitting: Rehabilitative and Restorative Service Providers"

## 2024-05-28 ENCOUNTER — Ambulatory Visit: Payer: Medicare (Managed Care) | Admitting: Rehabilitative and Restorative Service Providers"

## 2024-05-28 DIAGNOSIS — M6281 Muscle weakness (generalized): Secondary | ICD-10-CM | POA: Diagnosis not present

## 2024-05-28 DIAGNOSIS — M25562 Pain in left knee: Secondary | ICD-10-CM | POA: Diagnosis not present

## 2024-05-28 DIAGNOSIS — M25662 Stiffness of left knee, not elsewhere classified: Secondary | ICD-10-CM

## 2024-05-28 DIAGNOSIS — R600 Localized edema: Secondary | ICD-10-CM

## 2024-05-28 DIAGNOSIS — R262 Difficulty in walking, not elsewhere classified: Secondary | ICD-10-CM

## 2024-05-28 NOTE — Progress Notes (Signed)
 ____________________________________________________________  Attending physician addendum:  Thank you for sending this case to me. I have reviewed the entire note and agree with the plan.  Agree he should not have routine endoscopic procedures for a year after his stent placement.  Please place a recall so we do not lose track of him.  Victory Brand, MD  ____________________________________________________________

## 2024-05-28 NOTE — Therapy (Signed)
 OUTPATIENT PHYSICAL THERAPY TREATMENT   Patient Name: Richard Walker MRN: [id number] DOB:Jan 10, 1956, 68 y.o., male Today's Date: 05/28/2024  END OF SESSION:  PT End of Session - 05/28/24 1146     Visit Number 17    Number of Visits 24    Date for Recertification  06/27/24    Authorization Type [employer] 20% coinsurance    Authorization - Number of Visits 33    Progress Note Due on Visit 20    PT Start Time 1142    PT Stop Time 1220    PT Time Calculation (min) 38 min    Activity Tolerance Patient tolerated treatment well    Behavior During Therapy WFL for tasks assessed/performed                   Past Medical History:  Diagnosis Date   Arthritis    DDD (degenerative disc disease), lumbar    L3/L4   Diverticulosis    Dysrhythmia    SVT s/p ablation   Dysrhythmia    PAF   GERD (gastroesophageal reflux disease)    Hyperlipidemia    Low sperm motility    Myocardial infarction (HCC)    Pre-diabetes    Sleep apnea    uses cpap    SVT (supraventricular tachycardia)    Past Surgical History:  Procedure Laterality Date   AMPUTATION Left 07/15/2018   Procedure: . REPAIR OF LEFT INDEX AND LONG FINGER BY FUSION /  REPAIR OF RING FINGER;  Surgeon: Sebastian Lenis, MD;  Location[facility] OR;  Service: Orthopedics;  Laterality: Left;   BACK SURGERY     BLALOCK PROCEDURE     Ablation   CARDIAC CATHETERIZATION N/A 12/22/2015   Procedure: Left Heart Cath and Coronary Angiography;  Surgeon: Peter M Swaziland, MD;  Location: [facility] INVASIVE CV LAB;  Service: Cardiovascular;  Laterality: N/A;   CARDIAC ELECTROPHYSIOLOGY STUDY AND ABLATION     CORONARY STENT INTERVENTION N/A 05/01/2024   Procedure: CORONARY STENT INTERVENTION;  Surgeon: Ladona Heinz, MD;  Location: [facility] INVASIVE CV LAB;  Service: Cardiovascular;  Laterality: N/A;   ELECTROPHYSIOLOGIC STUDY N/A 02/19/2016   Procedure: SVT Ablation;  Surgeon: Danelle LELON Birmingham, MD;  Location: [facility] INVASIVE CV LAB;  Service:  Cardiovascular;  Laterality: N/A;   HAND SURGERY     LEFT HEART CATH AND CORONARY ANGIOGRAPHY N/A 05/01/2024   Procedure: LEFT HEART CATH AND CORONARY ANGIOGRAPHY;  Surgeon: Ladona Heinz, MD;  Location: [facility] INVASIVE CV LAB;  Service: Cardiovascular;  Laterality: N/A;   septal reconstruction  2017   TOTAL KNEE ARTHROPLASTY Left 03/27/2024   Procedure: ARTHROPLASTY, KNEE, TOTAL, LEFT;  Surgeon: Addie Cordella Hamilton, MD;  Location: [facility] OR;  Service: Orthopedics;  Laterality: Left;   TURBINATE REDUCTION Bilateral 07/2016   WISDOM TOOTH EXTRACTION     Patient Active Problem List   Diagnosis Date Noted   Use of opiates for therapeutic purposes 05/09/2024   CAD (coronary artery disease) 05/01/2024   Arthritis of left knee 04/15/2024   S/P TKR (total knee replacement), left 03/29/2024   S/P total knee arthroplasty, left 03/27/2024   Constipation 02/14/2024   Metabolic dysfunction-associated steatotic liver disease (MASLD) 01/13/2024   Abnormal food appetite 11/17/2023   Insulin  resistance 11/17/2023   Class 2 severe obesity due to excess calories with serious comorbidity and body mass index (BMI) of 35.0 to 35.9 in adult 11/03/2023   Prediabetes 11/03/2023   Degenerative disc disease, lumbar 12/09/2022   Morbid obesity (HCC) 10/26/2022  Bronchitis 01/29/2022   OSA on CPAP 02/19/2021   Bite, snake 01/28/2021   Neck pain 11/13/2020   Vertigo 11/13/2020   Hypertension 11/03/2020   Contusion of bone 10/23/2020   Degenerative arthritis of knee, bilateral 10/23/2020   Concussion with no loss of consciousness 10/23/2020   Paroxysmal atrial fibrillation (HCC) 05/22/2019   Unspecified atrial flutter (HCC) 05/22/2019   Pain in right foot 03/30/2018   Perennial and seasonal allergic rhinitis 11/09/2017   History of food allergy 11/09/2017   Bronchitis, mucopurulent recurrent (HCC) 09/07/2017   SVT (supraventricular tachycardia) 04/23/2015   Abdominal pain, epigastric 06/03/2013   Vitamin D  deficiency  04/09/2013   Esophageal reflux 04/09/2013   Hepatitis B carrier (HCC) 04/09/2013   Other and unspecified hyperlipidemia 04/09/2013   Sleep apnea 04/09/2013    PCP: Dallas Maxwell, PA-C  REFERRING PROVIDER: Cordella Glendia Hutchinson, MD  REFERRING DIAG:  Diagnosis  R53.1 (ICD-10-CM) - Weakness    THERAPY DIAG:  Difficulty in walking, not elsewhere classified  Muscle weakness (generalized)  Localized edema  Acute pain of left knee  Knee stiffness, left  Rationale for Evaluation and Treatment: Rehabilitation  ONSET DATE: 03/27/2024  SUBJECTIVE:   SUBJECTIVE STATEMENT: Pt indicated feeling 6/10 or so normal life  Pt indicated a shooting pain from lateral mid thigh to knee up t0 8/10.  Reported massage helps that area.   PERTINENT HISTORY: L3-4 lumbar DDD; pre-diabetes; previous back surgery; cardiac history  PAIN:  NPRS scale: 5/10 Pain location: Lt Knee (anterior) Pain description: not as sharp as before but still noted.  Aggravating factors: Sleeping  Relieving factors: Pain medications and ice  PRECAUTIONS: Back  RED FLAGS: None   WEIGHT BEARING RESTRICTIONS: No  FALLS:  Has patient fallen in last 6 months? No  LIVING ENVIRONMENT: Lives with: lives with their family, lives with their son, and lives with their daughter Lives in: House/apartment Stairs: Avoids stairs, walker and help for stairs now Has following equipment at home: Vannie - 2 wheeled  OCCUPATION: Retired  PLOF: Independent  PATIENT GOALS: Yard work, be more active   OBJECTIVE:  Note: Objective measures were completed at Evaluation unless otherwise noted.  DIAGNOSTIC FINDINGS: See chart  PATIENT SURVEYS:  PSFS: THE PATIENT SPECIFIC FUNCTIONAL SCALE  Place score of 0-10 (0 = unable to perform activity and 10 = able to perform activity at the same level as before injury or problem)  Activity Date: 04/04/2024 04/26/2024 05/28/2024  Sleep 0 4   2.  Sitting 2 6   3.  Moving around 2 7    4.  Showering 1 10   Total Score 1.25  6.75     Total Score = Sum of activity scores/number of activities  Minimally Detectable Change: 3 points (for single activity); 2 points (for average score)   COGNITION: 04/04/2024 Overall cognitive status: Within functional limits for tasks assessed     SENSATION: 04/04/2024 [facility]  EDEMA:  04/04/2024 Noted and not objectively assessed   LOWER EXTREMITY ROM:  Active ROM Left/Right 04/04/2024 Left  04/05/2024 Left 04/10/2024 04/13/24 04/17/24 Left 04/19/24 Left 04/26/2024 Left 05/25/2024  Knee flexion 62/145 82 with belt supine 100 Supine AROM heel slide P: 100 (guarding) Seated P: 112* Supine  A: 96* A: 114 Active 125   Knee extension -5/0 -3 supine -10 in seated LAQ AROM A: -7 (seated LAQ) LAQ A: -5* Standing A: -3* P: 0 Active lacks 3 degrees -2 seated LAQ Lt    (Blank rows = not tested)  LOWER  EXTREMITY STRENGTH:  MMT Right  04/04/2024 Left 04/04/2024 Right 04/27/2024 Left 04/30/2024 Left 05/08/2024 Left 05/25/2024  Hip flexion        Hip extension        Hip abduction        Hip adduction        Hip internal rotation        Hip external rotation        Knee flexion        Knee extension 4+ 2+ 81.1 in pounds (236 body weight) 25.7 lbs 31,.9, 28.4 4+/5 52, 48 lbs 5/5  Ankle dorsiflexion        Ankle plantarflexion        Ankle inversion        Ankle eversion         (Blank rows = not tested)  GAIT: 05/04/2024: SPC use today in ambulation in clinic.   04/30/2024: Ambulation c FWW today with improve sequencing noted compared to [facility] in last visits.   04/04/2024 Distance walked: 50 feet Assistive device utilized: Environmental consultant - 2 wheeled Level of assistance: Modified independence Comments: Gasper was assistive device free before surgery                    TREATMENT       DATE: 05/28/2024 Therex: Recumbent bike seat 5 lvl 5 8 mins.  (:10 second faster interval top of each minute from 2 mins to 7 mins) Knee extension machine  double leg up, single leg lowering Lt 3 x 10 15 lbs (in available painfree range) Incline gastroc stretch 30 sec x 5 bilateral   Neuro Re - ed SLS on foam with contralateral leg tapping corners of mat x 8 each WB on Lt leg Tandem stance on foam 1 min x 1 bilateral with occasional HHA    TherActivity (to improve stairs, squatting, transfers Lateral step down WB on Lt 6 inch step 2 x 10  Leg press double leg 137 lbs slow lowering x 15 Leg press single leg Lt slow lowering 2 x 15 68 lbs    TREATMENT       DATE: 05/25/2024 Therex: Recumbent bike seat 5 lvl 5 8 mins.  Knee extension machine double leg up, single leg lowering Lt 3 x 10 10 lbs (in available painfree range) Incline gastroc stretch 30 sec x 5 bilateral  Supine TKE stretch verbal review Seated tailgate flexion x 15 for mobility after extension stretch.  TherActivity (to improve stairs, squatting, transfers Leg press double leg 125 lbs slow lowering x 20 Leg press single leg Lt slow lowering 2 x 15 62 lbs    TREATMENT       DATE: 05/17/2024 Therex: Seated straight leg raises 3 sets of 5 for 3 seconds with 1# (quad set only on the left on the last set) Seated knee extension machine 90-40*, up bilateral, down left only with slow eccentric contraction 2 sets of 15  Neuromuscular re-education: Tandem balance 12 x 20 seconds (4 x head still; 4 x head turning; 4 x eyes closed)  Functional Activities: Double Leg Press: 125# 15 x slow eccentric Single Leg Press: 62# 10 x each side slow eccentric  Vaso Left Knee High 10 minutes 34*   TREATMENT       DATE: 05/10/2024 Therex: Recumbent bike lvl 4 10 mins for ROM only, seat 7 then 6 once loosed up.  Incilne gastroc stretch 30 sec x 3  Seated LAQ Lt 5 lbs 3 x  15  Supine TKE stretch start of vaso to tolerance approx 4 mins.  TherActivity (to improve stairs, squatting, transfers Lateral step down 4 inch step WB 2 x 10 , performed bilaterally  Sit to stand to sit 18 inch table  height no UE assist x 10 with slow lowering focus.    Neuro Re-ed (balance improvement Lateral step over 9 inch hurdle x 10 bilateral with light hand assist on bar SLS with step over and back 9 inch hurdle x 12 bilateral occasional HHA    Gait Training Verbal cues and visual demonstration for SPC height adjustment to prevent weight shift/lean to Rt.  Trial in clinic to address.  Discussed importance of using cane when symptoms are noted in ambulation to avoid antalgic gait impact.  Performed in clinic household distances for review throughout visit.    PATIENT EDUCATION:  Eval:  Education details: See above Person educated: Patient and Child(ren) Education method: Explanation, Demonstration, Tactile cues, Verbal cues, and Handouts Education comprehension: verbalized understanding, returned demonstration, verbal cues required, tactile cues required, and needs further education  HOME EXERCISE PROGRAM: Access Code: [email or web address] URL: [email or web address] Date: 04/26/2024 Prepared by: Lamar Ivory  Exercises - Seated Knee Flexion AAROM  - 5 x daily - 7 x weekly - 1 sets - 10 reps - 10 seconds hold - Supine Quadricep Sets  - 5 x daily - 7 x weekly - 2 sets - 10 reps - 5 second hold - Small Range Straight Leg Raise  - 2 x daily - 7 x weekly - 3-5 sets - 5 reps - 3 seconds hold - Tandem Stance  - 1 x daily - 7 x weekly - 1 sets - 10 reps - 20 second hold - Single Leg Stance  - 1 x daily - 7 x weekly - 1 sets - 10 reps - 10 seconds hold - Prone Quadricep Stretch with Strap  - 2 x daily - 7 x weekly - 1 sets - 5-10 reps - 20 seconds hold - Prone Knee Extension with Ankle Weight  - 2 x daily - 7 x weekly - 1 sets - 1 reps - 3-5 minutes hold  ASSESSMENT:  CLINICAL IMPRESSION: Some improvement in quality and symptoms noted in lateral step down performance.  Continued to focus on WB strengthening and [place] for quad gains.  Still using SPC as needed in ambulation but less reliance  noted.    OBJECTIVE IMPAIRMENTS: Abnormal gait, decreased activity tolerance, decreased endurance, decreased knowledge of condition, difficulty walking, decreased ROM, decreased strength, increased edema, impaired perceived functional ability, and pain.   ACTIVITY LIMITATIONS: bending, sitting, standing, squatting, sleeping, stairs, transfers, bed mobility, dressing, and locomotion level  PARTICIPATION LIMITATIONS: driving, community activity, and yard work  PERSONAL FACTORS: L3-4 lumbar DDD; pre-diabetes; previous back surgery; cardiac history are also affecting patient's functional outcome.    REHAB POTENTIAL: Good  CLINICAL DECISION MAKING: Stable/uncomplicated  EVALUATION COMPLEXITY: Low   GOALS: Goals reviewed with patient? Yes  SHORT TERM GOALS: Target date: 05/16/2024 Navid will be independent with his day 1 home exercise program Baseline: Started 04/04/2024 Goal status: Met 04/25/2024  2.  Improve left knee active range of motion to 3 - 0 -100 degrees Baseline: 5 - 0 - 62 degrees Goal status: Met 04/25/2024  3.  Improve left quadriceps strength as assessed by MMT and functional testing Baseline: 2+/5 at evaluation Goal status: Ongoing   05/17/2024  4.  Silvestre will be independent with a single-point cane for ambulation outside the  home Baseline: Wheeled walker full-time Goal status: Met 04/25/2024   LONG TERM GOALS: Target date: 06/27/2024  Improve patient specific functional score to at least 5 Baseline: 0.125 Goal status: Met 04/26/2024  2.  Romuald will report left knee pain consistently 0-3/10 on the numeric pain rating scale Baseline: 10/10 Goal status: Ongoing   05/17/2024  3.  Improve left knee AROM to 2 - 0 - 110 degrees or better Baseline: 5 - 0 - 62 degrees Goal status: Partially Met 04/26/2024  4.  Improve left quadriceps strength as assessed by MMT and Sina's ability to ambulate without an assistive device 100% of the time Baseline: Wheeled walker full-time Goal  status: Ongoing   05/17/2024  5.  Edrian will be independent with his long-term maintenance home exercise program at discharge Baseline: Started 04/04/2024 Goal status: Ongoing   05/17/2024    PLAN:  PT FREQUENCY: 2-3x/week  PT DURATION: 12 weeks  PLANNED INTERVENTIONS: 97750- Physical Performance Testing, 97110-Therapeutic exercises, 97530- Therapeutic activity, 97112- Neuromuscular re-education, 97535- Self Care, [id number]- Manual therapy, [phone]- Gait training, [phone]- Vasopneumatic device, Patient/Family education, Balance training, Stair training, Joint mobilization, and Cryotherapy  PLAN FOR NEXT SESSION: Dynamic balance control to improve stability without cane, strengthening.   NEXT MD VISIT: 06/18/2024   Ozell Silvan, PT, DPT, OCS, ATC 05/28/24  12:22 PM

## 2024-05-29 ENCOUNTER — Telehealth: Payer: Self-pay | Admitting: Gastroenterology

## 2024-05-29 NOTE — Telephone Encounter (Signed)
 Please call patient and let him know that Dr. Legrand has advised he should not have routine endoscopic procedures for a year after his stent placement (done 05/01/2024).  Please place colonoscopy recall for 05/01/2025. If he would like to discuss this further, can keep appointment as scheduled in December with Dr. Legrand, otherwise can cancel appointment and he can follow-up as needed for any issues between now and colonoscopy next year.

## 2024-05-30 NOTE — Telephone Encounter (Signed)
 Called patient. No answer. Left message for return call. Recall placed in EPIC.

## 2024-05-30 NOTE — Telephone Encounter (Signed)
 Patient returned the call. Agrees to this plan. Agrees to the canceling of the December appointment.

## 2024-05-31 ENCOUNTER — Other Ambulatory Visit (HOSPITAL_BASED_OUTPATIENT_CLINIC_OR_DEPARTMENT_OTHER): Payer: Self-pay

## 2024-05-31 ENCOUNTER — Encounter: Payer: Self-pay | Admitting: Rehabilitative and Restorative Service Providers"

## 2024-05-31 ENCOUNTER — Ambulatory Visit (INDEPENDENT_AMBULATORY_CARE_PROVIDER_SITE_OTHER): Payer: Medicare (Managed Care) | Admitting: Rehabilitative and Restorative Service Providers"

## 2024-05-31 ENCOUNTER — Ambulatory Visit (INDEPENDENT_AMBULATORY_CARE_PROVIDER_SITE_OTHER): Payer: Medicare (Managed Care) | Admitting: Podiatry

## 2024-05-31 ENCOUNTER — Ambulatory Visit (INDEPENDENT_AMBULATORY_CARE_PROVIDER_SITE_OTHER): Payer: Medicare (Managed Care)

## 2024-05-31 DIAGNOSIS — M25562 Pain in left knee: Secondary | ICD-10-CM

## 2024-05-31 DIAGNOSIS — M2142 Flat foot [pes planus] (acquired), left foot: Secondary | ICD-10-CM

## 2024-05-31 DIAGNOSIS — M722 Plantar fascial fibromatosis: Secondary | ICD-10-CM | POA: Diagnosis not present

## 2024-05-31 DIAGNOSIS — M25662 Stiffness of left knee, not elsewhere classified: Secondary | ICD-10-CM

## 2024-05-31 DIAGNOSIS — R262 Difficulty in walking, not elsewhere classified: Secondary | ICD-10-CM | POA: Diagnosis not present

## 2024-05-31 DIAGNOSIS — R600 Localized edema: Secondary | ICD-10-CM | POA: Diagnosis not present

## 2024-05-31 DIAGNOSIS — M6281 Muscle weakness (generalized): Secondary | ICD-10-CM | POA: Diagnosis not present

## 2024-05-31 DIAGNOSIS — M2141 Flat foot [pes planus] (acquired), right foot: Secondary | ICD-10-CM

## 2024-05-31 MED ORDER — FENOFIBRATE 145 MG PO TABS
145.0000 mg | ORAL_TABLET | Freq: Every day | ORAL | 0 refills | Status: DC
  Filled 2024-05-31: qty 90, 90d supply, fill #0

## 2024-05-31 MED ORDER — LOSARTAN POTASSIUM 25 MG PO TABS
25.0000 mg | ORAL_TABLET | Freq: Every day | ORAL | 0 refills | Status: DC
  Filled 2024-08-04: qty 90, 90d supply, fill #0

## 2024-05-31 MED ORDER — FLECAINIDE ACETATE 150 MG PO TABS
150.0000 mg | ORAL_TABLET | Freq: Two times a day (BID) | ORAL | 0 refills | Status: DC
  Filled 2024-05-31: qty 60, 30d supply, fill #0

## 2024-05-31 MED ORDER — SEMAGLUTIDE-WEIGHT MANAGEMENT 0.25 MG/0.5ML ~~LOC~~ SOAJ
0.2500 mg | SUBCUTANEOUS | 0 refills | Status: DC
  Filled 2024-05-31: qty 2, 28d supply, fill #0

## 2024-05-31 NOTE — Patient Instructions (Addendum)
Plantar Fasciitis (Heel Spur Syndrome) with Rehab The plantar fascia is a fibrous, ligament-like, soft-tissue structure that spans the bottom of the foot. Plantar fasciitis is a condition that causes pain in the foot due to inflammation of the tissue. SYMPTOMS  Pain and tenderness on the underneath side of the foot. Pain that worsens with standing or walking. CAUSES  Plantar fasciitis is caused by irritation and injury to the plantar fascia on the underneath side of the foot. Common mechanisms of injury include: Direct trauma to bottom of the foot. Damage to a small nerve that runs under the foot where the main fascia attaches to the heel bone. Stress placed on the plantar fascia due to bone spurs. RISK INCREASES WITH:  Activities that place stress on the plantar fascia (running, jumping, pivoting, or cutting). Poor strength and flexibility. Improperly fitted shoes. Tight calf muscles. Flat feet. Failure to warm-up properly before activity. Obesity. PREVENTION Warm up and stretch properly before activity. Allow for adequate recovery between workouts. Maintain physical fitness: Strength, flexibility, and endurance. Cardiovascular fitness. Maintain a health body weight. Avoid stress on the plantar fascia. Wear properly fitted shoes, including arch supports for individuals who have flat feet.  PROGNOSIS  If treated properly, then the symptoms of plantar fasciitis usually resolve without surgery. However, occasionally surgery is necessary.  RELATED COMPLICATIONS  Recurrent symptoms that may result in a chronic condition. Problems of the lower back that are caused by compensating for the injury, such as limping. Pain or weakness of the foot during push-off following surgery. Chronic inflammation, scarring, and partial or complete fascia tear, occurring more often from repeated injections.  TREATMENT  Treatment initially involves the use of ice and medication to help reduce pain and  inflammation. The use of strengthening and stretching exercises may help reduce pain with activity, especially stretches of the Achilles tendon. These exercises may be performed at home or with a therapist. Your caregiver may recommend that you use heel cups of arch supports to help reduce stress on the plantar fascia. Occasionally, corticosteroid injections are given to reduce inflammation. If symptoms persist for greater than 6 months despite non-surgical (conservative), then surgery may be recommended.   MEDICATION  If pain medication is necessary, then nonsteroidal anti-inflammatory medications, such as aspirin and ibuprofen, or other minor pain relievers, such as acetaminophen, are often recommended. Do not take pain medication within 7 days before surgery. Prescription pain relievers may be given if deemed necessary by your caregiver. Use only as directed and only as much as you need. Corticosteroid injections may be given by your caregiver. These injections should be reserved for the most serious cases, because they may only be given a certain number of times.  HEAT AND COLD Cold treatment (icing) relieves pain and reduces inflammation. Cold treatment should be applied for 10 to 15 minutes every 2 to 3 hours for inflammation and pain and immediately after any activity that aggravates your symptoms. Use ice packs or massage the area with a piece of ice (ice massage). Heat treatment may be used prior to performing the stretching and strengthening activities prescribed by your caregiver, physical therapist, or athletic trainer. Use a heat pack or soak the injury in warm water.  SEEK IMMEDIATE MEDICAL CARE IF: Treatment seems to offer no benefit, or the condition worsens. Any medications produce adverse side effects.  EXERCISES- RANGE OF MOTION (ROM) AND STRETCHING EXERCISES - Plantar Fasciitis (Heel Spur Syndrome) These exercises may help you when beginning to rehabilitate your injury. Your    symptoms may resolve with or without further involvement from your physician, physical therapist or athletic trainer. While completing these exercises, remember:  Restoring tissue flexibility helps normal motion to return to the joints. This allows healthier, less painful movement and activity. An effective stretch should be held for at least 30 seconds. A stretch should never be painful. You should only feel a gentle lengthening or release in the stretched tissue.  RANGE OF MOTION - Toe Extension, Flexion Sit with your right / left leg crossed over your opposite knee. Grasp your toes and gently pull them back toward the top of your foot. You should feel a stretch on the bottom of your toes and/or foot. Hold this stretch for 10 seconds. Now, gently pull your toes toward the bottom of your foot. You should feel a stretch on the top of your toes and or foot. Hold this stretch for 10 seconds. Repeat  times. Complete this stretch 3 times per day.   RANGE OF MOTION - Ankle Dorsiflexion, Active Assisted Remove shoes and sit on a chair that is preferably not on a carpeted surface. Place right / left foot under knee. Extend your opposite leg for support. Keeping your heel down, slide your right / left foot back toward the chair until you feel a stretch at your ankle or calf. If you do not feel a stretch, slide your bottom forward to the edge of the chair, while still keeping your heel down. Hold this stretch for 10 seconds. Repeat 3 times. Complete this stretch 2 times per day.   STRETCH  Gastroc, Standing Place hands on wall. Extend right / left leg, keeping the front knee somewhat bent. Slightly point your toes inward on your back foot. Keeping your right / left heel on the floor and your knee straight, shift your weight toward the wall, not allowing your back to arch. You should feel a gentle stretch in the right / left calf. Hold this position for 10 seconds. Repeat 3 times. Complete this  stretch 2 times per day.  STRETCH  Soleus, Standing Place hands on wall. Extend right / left leg, keeping the other knee somewhat bent. Slightly point your toes inward on your back foot. Keep your right / left heel on the floor, bend your back knee, and slightly shift your weight over the back leg so that you feel a gentle stretch deep in your back calf. Hold this position for 10 seconds. Repeat 3 times. Complete this stretch 2 times per day.  STRETCH  Gastrocsoleus, Standing  Note: This exercise can place a lot of stress on your foot and ankle. Please complete this exercise only if specifically instructed by your caregiver.  Place the ball of your right / left foot on a step, keeping your other foot firmly on the same step. Hold on to the wall or a rail for balance. Slowly lift your other foot, allowing your body weight to press your heel down over the edge of the step. You should feel a stretch in your right / left calf. Hold this position for 10 seconds. Repeat this exercise with a slight bend in your right / left knee. Repeat 3 times. Complete this stretch 2 times per day.   STRENGTHENING EXERCISES - Plantar Fasciitis (Heel Spur Syndrome)  These exercises may help you when beginning to rehabilitate your injury. They may resolve your symptoms with or without further involvement from your physician, physical therapist or athletic trainer. While completing these exercises, remember:  Muscles can   gain both the endurance and the strength needed for everyday activities through controlled exercises. Complete these exercises as instructed by your physician, physical therapist or athletic trainer. Progress the resistance and repetitions only as guided.  STRENGTH - Towel Curls Sit in a chair positioned on a non-carpeted surface. Place your foot on a towel, keeping your heel on the floor. Pull the towel toward your heel by only curling your toes. Keep your heel on the floor. Repeat 3 times.  Complete this exercise 2 times per day.  STRENGTH - Ankle Inversion Secure one end of a rubber exercise band/tubing to a fixed object (table, pole). Loop the other end around your foot just before your toes. Place your fists between your knees. This will focus your strengthening at your ankle. Slowly, pull your big toe up and in, making sure the band/tubing is positioned to resist the entire motion. Hold this position for 10 seconds. Have your muscles resist the band/tubing as it slowly pulls your foot back to the starting position. Repeat 3 times. Complete this exercises 2 times per day.  Document Released: 08/09/2005 Document Revised: 11/01/2011 Document Reviewed: 11/21/2008 [employer] Patient Information 2014 [employer]   WEARING INSTRUCTIONS FOR ORTHOTICS  Don't expect to be comfortable wearing your orthotic devices for the first time.  Like eyeglasses, you may be aware of them as time passes, they will not be uncomfortable and you will enjoy wearing them.  FOLLOW THESE INSTRUCTIONS EXACTLY!  Wear your orthotic devices for:       Not more than 1 hour the first day.       Not more than 2 hours the second day.       Not more than 3 hours the third day and so on.        Or wear them for as long as they feel comfortable.       If you experience discomfort in your feet or legs take them out.  When feet & legs feel       better, put them back in.  You do need to be consistent and wear them a little        everyday. 2.   If at any time the orthotic devices become acutely uncomfortable before the       time for that particular day, STOP WEARING THEM. 3.   On the next day, do not increase the wearing time. 4.   Subsequently, increase the wearing time by 15-30 minutes only if comfortable to do       so. 5.   You will be seen by your doctor about 2-4 weeks after you receive your orthotic       devices, at which time you will probably be wearing your devices comfortably        for about  8 hours or more a day. 6.   Some patients occasionally report mild aches or discomfort in other parts of the of       body such as the knees, hips or back after 3 or 4 consecutive hours of wear.  If this       is the case with you, do not extend your wearing time.  Instead, cut it back an hour or       two.  In all likelihood, these symptoms will disappear in a short period of time as your       body posture realigns itself and functions more efficiently. 7.   It is possible that   your orthotic device may require some small changes or adjustment       to improve their function or make them more comfortable.   This is usually not done       before one to three months have elapsed.  These adjustments are made in        accordance with the changed position your feet are assuming as a result of       improved biomechanical function. 8.   In women's shoes, it's not unusual for your heel to slip out of the shoe, particularly if       they are step-in-shoes.  If this is the case, try other shoes or other styles.  Try to       purchase shoes which have deeper heal seats or higher heel counters. 9.   Squeaking of orthotics devices in the shoes is due to the movement of the devices       when they are functioning normally.  To eliminate squeaking, simply dust some       baby powder into your shoes before inserting the devices.  If this does not work,        apply soap or wax to the edges of the orthotic devices or put a tissue into the shoes. 10. It is important that you follow these directions explicitly.  Failure to do so will simply       prolong the adjustment period or create problems which are easily avoided.  It makes       no difference if you are wearing your orthotic devices for only a few hours after        several months, so long as you are wearing them comfortably for those hours. 11. If you have any questions or complaints, contact our office.  We have no way of       knowing about your  problems unless you tell us.  If we do not hear from you, we will       assume that you are proceeding well.  

## 2024-05-31 NOTE — Therapy (Signed)
 OUTPATIENT PHYSICAL THERAPY TREATMENT   Patient Name: Richard Walker MRN: [id number] DOB:17-Feb-1956, 68 y.o., male Today's Date: 05/31/2024  END OF SESSION:  PT End of Session - 05/31/24 1102     Visit Number 18    Number of Visits 24    Date for Recertification  06/27/24    Authorization Type [employer] 20% coinsurance    Authorization - Number of Visits 33    Progress Note Due on Visit 20    PT Start Time 1057    PT Stop Time 1136    PT Time Calculation (min) 39 min    Activity Tolerance Patient tolerated treatment well    Behavior During Therapy WFL for tasks assessed/performed                    Past Medical History:  Diagnosis Date   Arthritis    DDD (degenerative disc disease), lumbar    L3/L4   Diverticulosis    Dysrhythmia    SVT s/p ablation   Dysrhythmia    PAF   GERD (gastroesophageal reflux disease)    Hyperlipidemia    Low sperm motility    Myocardial infarction (HCC)    Pre-diabetes    Sleep apnea    uses cpap    SVT (supraventricular tachycardia)    Past Surgical History:  Procedure Laterality Date   AMPUTATION Left 07/15/2018   Procedure: . REPAIR OF LEFT INDEX AND LONG FINGER BY FUSION /  REPAIR OF RING FINGER;  Surgeon: Sebastian Lenis, MD;  Location: [facility] OR;  Service: Orthopedics;  Laterality: Left;   BACK SURGERY     BLALOCK PROCEDURE     Ablation   CARDIAC CATHETERIZATION N/A 12/22/2015   Procedure: Left Heart Cath and Coronary Angiography;  Surgeon: Peter M Swaziland, MD;  Location: [facility] INVASIVE CV LAB;  Service: Cardiovascular;  Laterality: N/A;   CARDIAC ELECTROPHYSIOLOGY STUDY AND ABLATION     CORONARY STENT INTERVENTION N/A 05/01/2024   Procedure: CORONARY STENT INTERVENTION;  Surgeon: Ladona Heinz, MD;  Location: [facility] INVASIVE CV LAB;  Service: Cardiovascular;  Laterality: N/A;   ELECTROPHYSIOLOGIC STUDY N/A 02/19/2016   Procedure: SVT Ablation;  Surgeon: Danelle LELON Birmingham, MD;  Location: [facility] INVASIVE CV LAB;  Service:  Cardiovascular;  Laterality: N/A;   HAND SURGERY     LEFT HEART CATH AND CORONARY ANGIOGRAPHY N/A 05/01/2024   Procedure: LEFT HEART CATH AND CORONARY ANGIOGRAPHY;  Surgeon: Ladona Heinz, MD;  Location: [facility] INVASIVE CV LAB;  Service: Cardiovascular;  Laterality: N/A;   septal reconstruction  2017   TOTAL KNEE ARTHROPLASTY Left 03/27/2024   Procedure: ARTHROPLASTY, KNEE, TOTAL, LEFT;  Surgeon: Addie Cordella Hamilton, MD;  Location: [facility] OR;  Service: Orthopedics;  Laterality: Left;   TURBINATE REDUCTION Bilateral 07/2016   WISDOM TOOTH EXTRACTION     Patient Active Problem List   Diagnosis Date Noted   Use of opiates for therapeutic purposes 05/09/2024   CAD (coronary artery disease) 05/01/2024   Arthritis of left knee 04/15/2024   S/P TKR (total knee replacement), left 03/29/2024   S/P total knee arthroplasty, left 03/27/2024   Constipation 02/14/2024   Metabolic dysfunction-associated steatotic liver disease (MASLD) 01/13/2024   Abnormal food appetite 11/17/2023   Insulin  resistance 11/17/2023   Class 2 severe obesity due to excess calories with serious comorbidity and body mass index (BMI) of 35.0 to 35.9 in adult 11/03/2023   Prediabetes 11/03/2023   Degenerative disc disease, lumbar 12/09/2022   Morbid obesity (HCC) 10/26/2022  Bronchitis 01/29/2022   OSA on CPAP 02/19/2021   Bite, snake 01/28/2021   Neck pain 11/13/2020   Vertigo 11/13/2020   Hypertension 11/03/2020   Contusion of bone 10/23/2020   Degenerative arthritis of knee, bilateral 10/23/2020   Concussion with no loss of consciousness 10/23/2020   Paroxysmal atrial fibrillation (HCC) 05/22/2019   Unspecified atrial flutter (HCC) 05/22/2019   Pain in right foot 03/30/2018   Perennial and seasonal allergic rhinitis 11/09/2017   History of food allergy 11/09/2017   Bronchitis, mucopurulent recurrent (HCC) 09/07/2017   SVT (supraventricular tachycardia) 04/23/2015   Abdominal pain, epigastric 06/03/2013   Vitamin D  deficiency  04/09/2013   Esophageal reflux 04/09/2013   Hepatitis B carrier (HCC) 04/09/2013   Other and unspecified hyperlipidemia 04/09/2013   Sleep apnea 04/09/2013    PCP: Dallas Maxwell, PA-C  REFERRING PROVIDER: Cordella Glendia Hutchinson, MD  REFERRING DIAG:  Diagnosis  R53.1 (ICD-10-CM) - Weakness    THERAPY DIAG:  Difficulty in walking, not elsewhere classified  Muscle weakness (generalized)  Localized edema  Acute pain of left knee  Knee stiffness, left  Rationale for Evaluation and Treatment: Rehabilitation  ONSET DATE: 03/27/2024  SUBJECTIVE:   SUBJECTIVE STATEMENT: Pt indicated nothing unusual for pain.  Still some complaints at times in anterior thigh to anterior knee.     PERTINENT HISTORY: L3-4 lumbar DDD; pre-diabetes; previous back surgery; cardiac history  PAIN:  NPRS scale: 4/10 Pain location: Lt Knee (anterior) Pain description: not as sharp as before but still noted.  Aggravating factors: Sleeping  Relieving factors: Pain medications and ice  PRECAUTIONS: Back  RED FLAGS: None   WEIGHT BEARING RESTRICTIONS: No  FALLS:  Has patient fallen in last 6 months? No  LIVING ENVIRONMENT: Lives with: lives with their family, lives with their son, and lives with their daughter Lives in: House/apartment Stairs: Avoids stairs, walker and help for stairs now Has following equipment at home: Richard Walker - 2 wheeled  OCCUPATION: Retired  PLOF: Independent  PATIENT GOALS: Yard work, be more active   OBJECTIVE:  Note: Objective measures were completed at Evaluation unless otherwise noted.  DIAGNOSTIC FINDINGS: See chart  PATIENT SURVEYS:  PSFS: THE PATIENT SPECIFIC FUNCTIONAL SCALE  Place score of 0-10 (0 = unable to perform activity and 10 = able to perform activity at the same level as before injury or problem)  Activity Date: 04/04/2024 04/26/2024 05/28/2024  Sleep 0 4   2.  Sitting 2 6   3.  Moving around 2 7   4.  Showering 1 10   Total Score 1.25  6.75      Total Score = Sum of activity scores/number of activities  Minimally Detectable Change: 3 points (for single activity); 2 points (for average score)   COGNITION: 04/04/2024 Overall cognitive status: Within functional limits for tasks assessed     SENSATION: 04/04/2024 [facility]  EDEMA:  04/04/2024 Noted and not objectively assessed   LOWER EXTREMITY ROM:  Active ROM Left/Right 04/04/2024 Left  04/05/2024 Left 04/10/2024 04/13/24 04/17/24 Left 04/19/24 Left 04/26/2024 Left 05/25/2024  Knee flexion 62/145 82 with belt supine 100 Supine AROM heel slide P: 100 (guarding) Seated P: 112* Supine  A: 96* A: 114 Active 125   Knee extension -5/0 -3 supine -10 in seated LAQ AROM A: -7 (seated LAQ) LAQ A: -5* Standing A: -3* P: 0 Active lacks 3 degrees -2 seated LAQ Lt    (Blank rows = not tested)  LOWER EXTREMITY STRENGTH:  MMT Right  04/04/2024 Left 04/04/2024  Right 04/27/2024 Left 04/30/2024 Left 05/08/2024 Left 05/25/2024  Hip flexion        Hip extension        Hip abduction        Hip adduction        Hip internal rotation        Hip external rotation        Knee flexion        Knee extension 4+ 2+ 81.1 in pounds (236 body weight) 25.7 lbs 31,.9, 28.4 4+/5 52, 48 lbs 5/5  Ankle dorsiflexion        Ankle plantarflexion        Ankle inversion        Ankle eversion         (Blank rows = not tested)  GAIT: 05/04/2024: SPC use today in ambulation in clinic.   04/30/2024: Ambulation c FWW today with improve sequencing noted compared to [facility] in last visits.   04/04/2024 Distance walked: 50 feet Assistive device utilized: Environmental consultant - 2 wheeled Level of assistance: Modified independence Comments: Richard Walker was assistive device free before surgery                    TREATMENT       DATE: 05/31/2024 Therex: Recumbent bike seat 5 lvl 5 8 mins.  (:15 second faster interval top of each minute from 2 mins to 7 mins) Incline gastroc stretch 30 sec x 5 bilateral  Knee extension machine double  leg up, single leg lowering Lt 3 x 15 15 lbs (in available painfree range) Knee flexion machine Lt leg 3 x 10 15 lbs   Discussed gym membership options with suggestion to check with insurance about financial coverage.    TherActivity (to improve stairs, squatting, transfers Leg press double leg 137 lbs slow lowering x 15 Leg press single leg Lt slow lowering 2 x 15 68 lbs  Flight of stairs down with reciprocal gait pattern with hand rail assist.  Focused on controlled lowering.   TREATMENT       DATE: 05/28/2024 Therex: Recumbent bike seat 5 lvl 5 8 mins.  (:10 second faster interval top of each minute from 2 mins to 7 mins) Knee extension machine double leg up, single leg lowering Lt 3 x 10 15 lbs (in available painfree range) Incline gastroc stretch 30 sec x 5 bilateral   Neuro Re - ed SLS on foam with contralateral leg tapping corners of mat x 8 each WB on Lt leg Tandem stance on foam 1 min x 1 bilateral with occasional HHA    TherActivity (to improve stairs, squatting, transfers Lateral step down WB on Lt 6 inch step 2 x 10  Leg press double leg 137 lbs slow lowering x 15 Leg press single leg Lt slow lowering 2 x 15 68 lbs    TREATMENT       DATE: 05/25/2024 Therex: Recumbent bike seat 5 lvl 5 8 mins.  Knee extension machine double leg up, single leg lowering Lt 3 x 10 10 lbs (in available painfree range) Incline gastroc stretch 30 sec x 5 bilateral  Supine TKE stretch verbal review Seated tailgate flexion x 15 for mobility after extension stretch.  TherActivity (to improve stairs, squatting, transfers Leg press double leg 125 lbs slow lowering x 20 Leg press single leg Lt slow lowering 2 x 15 62 lbs    TREATMENT       DATE: 05/17/2024 Therex: Seated straight leg raises 3 sets of  5 for 3 seconds with 1# (quad set only on the left on the last set) Seated knee extension machine 90-40*, up bilateral, down left only with slow eccentric contraction 2 sets of  15  Neuromuscular re-education: Tandem balance 12 x 20 seconds (4 x head still; 4 x head turning; 4 x eyes closed)  Functional Activities: Double Leg Press: 125# 15 x slow eccentric Single Leg Press: 62# 10 x each side slow eccentric  Vaso Left Knee High 10 minutes 34*   TREATMENT       DATE: 05/10/2024 Therex: Recumbent bike lvl 4 10 mins for ROM only, seat 7 then 6 once loosed up.  Incilne gastroc stretch 30 sec x 3  Seated LAQ Lt 5 lbs 3 x 15  Supine TKE stretch start of vaso to tolerance approx 4 mins.  TherActivity (to improve stairs, squatting, transfers Lateral step down 4 inch step WB 2 x 10 , performed bilaterally  Sit to stand to sit 18 inch table height no UE assist x 10 with slow lowering focus.    Neuro Re-ed (balance improvement Lateral step over 9 inch hurdle x 10 bilateral with light hand assist on bar SLS with step over and back 9 inch hurdle x 12 bilateral occasional HHA    Gait Training Verbal cues and visual demonstration for SPC height adjustment to prevent weight shift/lean to Rt.  Trial in clinic to address.  Discussed importance of using cane when symptoms are noted in ambulation to avoid antalgic gait impact.  Performed in clinic household distances for review throughout visit.    PATIENT EDUCATION:  Eval:  Education details: See above Person educated: Patient and Child(ren) Education method: Explanation, Demonstration, Tactile cues, Verbal cues, and Handouts Education comprehension: verbalized understanding, returned demonstration, verbal cues required, tactile cues required, and needs further education  HOME EXERCISE PROGRAM: Access Code: [email or web address] URL: [email or web address] Date: 04/26/2024 Prepared by: Richard Walker  Exercises - Seated Knee Flexion AAROM  - 5 x daily - 7 x weekly - 1 sets - 10 reps - 10 seconds hold - Supine Quadricep Sets  - 5 x daily - 7 x weekly - 2 sets - 10 reps - 5 second hold - Small Range Straight Leg  Raise  - 2 x daily - 7 x weekly - 3-5 sets - 5 reps - 3 seconds hold - Tandem Stance  - 1 x daily - 7 x weekly - 1 sets - 10 reps - 20 second hold - Single Leg Stance  - 1 x daily - 7 x weekly - 1 sets - 10 reps - 10 seconds hold - Prone Quadricep Stretch with Strap  - 2 x daily - 7 x weekly - 1 sets - 5-10 reps - 20 seconds hold - Prone Knee Extension with Ankle Weight  - 2 x daily - 7 x weekly - 1 sets - 1 reps - 3-5 minutes hold  ASSESSMENT:  CLINICAL IMPRESSION: Continued improvement in symptom reporting noted over last few weeks.  Strength gains most likely a large role in that improvement.  Discussed option for gym membership and encouraged contacting insurance to find out about whether cost would be covered.     OBJECTIVE IMPAIRMENTS: Abnormal gait, decreased activity tolerance, decreased endurance, decreased knowledge of condition, difficulty walking, decreased ROM, decreased strength, increased edema, impaired perceived functional ability, and pain.   ACTIVITY LIMITATIONS: bending, sitting, standing, squatting, sleeping, stairs, transfers, bed mobility, dressing, and locomotion level  PARTICIPATION LIMITATIONS: driving, community activity, and yard  work  PERSONAL FACTORS: L3-4 lumbar DDD; pre-diabetes; previous back surgery; cardiac history are also affecting patient's functional outcome.    REHAB POTENTIAL: Good  CLINICAL DECISION MAKING: Stable/uncomplicated  EVALUATION COMPLEXITY: Low   GOALS: Goals reviewed with patient? Yes  SHORT TERM GOALS: Target date: 05/16/2024 Ascencion will be independent with his day 1 home exercise program Baseline: Started 04/04/2024 Goal status: Met 04/25/2024  2.  Improve left knee active range of motion to 3 - 0 -100 degrees Baseline: 5 - 0 - 62 degrees Goal status: Met 04/25/2024  3.  Improve left quadriceps strength as assessed by MMT and functional testing Baseline: 2+/5 at evaluation Goal status: Ongoing   05/17/2024  4.  Richard Walker will be  independent with a single-point cane for ambulation outside the home Baseline: Wheeled walker full-time Goal status: Met 04/25/2024   LONG TERM GOALS: Target date: 06/27/2024  Improve patient specific functional score to at least 5  Goal status: Met 04/26/2024  2.  Richard Walker will report left knee pain consistently 0-3/10 on the numeric pain rating scale  Goal status: Ongoing   05/17/2024  3.  Improve left knee AROM to 2 - 0 - 110 degrees or better  Goal status: Partially Met 04/26/2024  4.  Improve left quadriceps strength as assessed by MMT and Richard Walker's ability to ambulate without an assistive device 100% of the time  Goal status: Ongoing   05/17/2024  5.  Richard Walker will be independent with his long-term maintenance home exercise program at discharge  Goal status: Ongoing   05/17/2024    PLAN:  PT FREQUENCY: 2-3x/week  PT DURATION: 12 weeks  PLANNED INTERVENTIONS: 97750- Physical Performance Testing, 97110-Therapeutic exercises, 97530- Therapeutic activity, 97112- Neuromuscular re-education, 97535- Self Care, 02859- Manual therapy, [phone]- Gait training, [phone]- Vasopneumatic device, Patient/Family education, Balance training, Stair training, Joint mobilization, and Cryotherapy  PLAN FOR NEXT SESSION: Check on any information about gym membershipo.  NEXT MD VISIT: 06/18/2024   Richard Walker, PT, DPT, OCS, ATC 05/31/24  11:37 AM

## 2024-06-02 ENCOUNTER — Other Ambulatory Visit (HOSPITAL_COMMUNITY): Payer: Self-pay

## 2024-06-02 NOTE — Progress Notes (Signed)
 Subjective:   Patient ID: Richard Walker, male   DOB: 68 y.o.   MRN: [id number]   HPI Chief Complaint  Patient presents with   Foot Pain    Patient stated that he has been having bilateral foot pain in bilateral arches and dorsal of bilateral feet, patient stated that it feels like needles when walking and they hurt more at night. Patient is not taking medication for pain, and stated that this has been going on for a year now    68 year old male is the office with concerns of bilateral foot pain.  He states that he has similar issues that side has around to treat and he had come to his sons appointment.  He states he has flatfeet and is interested in inserts.  States that at times he gets pain in the arch of the foot and at times will be a needle sensation but does not travel up the legs.  No recent injuries.  No swelling.  He is pre-diabetic, but not on medications.    Review of Systems  All other systems reviewed and are negative.  Past Medical History:  Diagnosis Date   Arthritis    DDD (degenerative disc disease), lumbar    L3/L4   Diverticulosis    Dysrhythmia    SVT s/p ablation   Dysrhythmia    PAF   GERD (gastroesophageal reflux disease)    Hyperlipidemia    Low sperm motility    Myocardial infarction (HCC)    Pre-diabetes    Sleep apnea    uses cpap    SVT (supraventricular tachycardia)     Past Surgical History:  Procedure Laterality Date   AMPUTATION Left 07/15/2018   Procedure: . REPAIR OF LEFT INDEX AND LONG FINGER BY FUSION /  REPAIR OF RING FINGER;  Surgeon: Sebastian Lenis, MD;  Location: [facility] OR;  Service: Orthopedics;  Laterality: Left;   BACK SURGERY     BLALOCK PROCEDURE     Ablation   CARDIAC CATHETERIZATION N/A 12/22/2015   Procedure: Left Heart Cath and Coronary Angiography;  Surgeon: Peter M Swaziland, MD;  Location: [facility] INVASIVE CV LAB;  Service: Cardiovascular;  Laterality: N/A;   CARDIAC ELECTROPHYSIOLOGY STUDY AND ABLATION     CORONARY STENT  INTERVENTION N/A 05/01/2024   Procedure: CORONARY STENT INTERVENTION;  Surgeon: Ladona Heinz, MD;  Location: [facility] INVASIVE CV LAB;  Service: Cardiovascular;  Laterality: N/A;   ELECTROPHYSIOLOGIC STUDY N/A 02/19/2016   Procedure: SVT Ablation;  Surgeon: Danelle LELON Birmingham, MD;  Location: [facility] INVASIVE CV LAB;  Service: Cardiovascular;  Laterality: N/A;   HAND SURGERY     LEFT HEART CATH AND CORONARY ANGIOGRAPHY N/A 05/01/2024   Procedure: LEFT HEART CATH AND CORONARY ANGIOGRAPHY;  Surgeon: Ladona Heinz, MD;  Location: [facility] INVASIVE CV LAB;  Service: Cardiovascular;  Laterality: N/A;   septal reconstruction  2017   TOTAL KNEE ARTHROPLASTY Left 03/27/2024   Procedure: ARTHROPLASTY, KNEE, TOTAL, LEFT;  Surgeon: Addie Cordella Hamilton, MD;  Location: [facility] OR;  Service: Orthopedics;  Laterality: Left;   TURBINATE REDUCTION Bilateral 07/2016   WISDOM TOOTH EXTRACTION       Current Outpatient Medications:    albuterol  (PROAIR  HFA) 108 (90 Base) MCG/ACT inhaler, Inhale 1-2 puffs into the lungs every 6 (six) hours as needed for wheezing or shortness of breath., Disp: 24 g, Rfl: 3   aspirin  EC 81 MG tablet, Take 1 tablet (81 mg total) by mouth daily. Swallow whole., Disp: 15 tablet, Rfl: 0   azelastine  (  ASTELIN ) 0.1 % nasal spray, 1-2 sprays per nostril 2 times daily as needed., Disp: 90 mL, Rfl: 3   Blood Pressure Monitoring (BLOOD PRESSURE CUFF) MISC, MONITOR BLOOD PRESSURE ONCE DAILY GOAL OF 130/80, Disp: 1 each, Rfl: 0   budesonide -glycopyrrolate -formoterol  (BREZTRI  AEROSPHERE) 160-9-4.8 MCG/ACT AERO inhaler, Inhale 2 puffs into the lungs in the morning and at bedtime., Disp: 10.7 g, Rfl: 10   cholecalciferol  (VITAMIN D3) 25 MCG (1000 UNIT) tablet, Take 4,000 Units by mouth daily., Disp: , Rfl:    clopidogrel  (PLAVIX ) 75 MG tablet, Take 1 tablet (75 mg total) by mouth daily., Disp: 90 tablet, Rfl: 3   docusate sodium  (COLACE) 100 MG capsule, Take 1 capsule (100 mg total) by mouth 2 (two) times daily., Disp: 30 capsule, Rfl:  0   ezetimibe  (ZETIA ) 10 MG tablet, Take 1 tablet (10 mg total) by mouth daily., Disp: 90 tablet, Rfl: 3   fenofibrate  (TRICOR ) 145 MG tablet, Take 1 tablet (145 mg total) by mouth daily., Disp: 90 tablet, Rfl: 3   fenofibrate  (TRICOR ) 145 MG tablet, Take 1 tablet (145 mg total) by mouth daily., Disp: 90 tablet, Rfl: 0   flecainide  (TAMBOCOR ) 150 MG tablet, Take 1 tablet (150 mg total) by mouth 2 (two) times daily., Disp: 60 tablet, Rfl: 0   gabapentin  (NEURONTIN ) 100 MG capsule, Take 1 capsule (100 mg total) by mouth 2 (two) times daily., Disp: 42 capsule, Rfl: 0   losartan  (COZAAR ) 25 MG tablet, Take 1 tablet (25 mg total) by mouth daily., Disp: 90 tablet, Rfl: 3   losartan  (COZAAR ) 25 MG tablet, Take 1 tablet (25 mg total) by mouth daily., Disp: 90 tablet, Rfl: 0   Magnesium  250 MG TABS, Take 250 mg by mouth once a week., Disp: , Rfl:    metFORMIN  (GLUCOPHAGE ) 500 MG tablet, TAKE 1 TABLET(500 MG) BY MOUTH TWICE DAILY WITH A MEAL, Disp: 180 tablet, Rfl: 3   methocarbamol  (ROBAXIN ) 500 MG tablet, Take 1 tablet (500 mg total) by mouth every 8 (eight) hours as needed for muscle spasms., Disp: 30 tablet, Rfl: 0   metoprolol  tartrate (LOPRESSOR ) 25 MG tablet, Take one tablet by mouth twice a day.  May take one additional tablet by mouth as needed for breakthrough palpitations., Disp: 270 tablet, Rfl: 3   nitroGLYCERIN  (NITROSTAT ) 0.4 MG SL tablet, Place 1 tablet (0.4 mg total) under the tongue every 5 (five) minutes as needed for up to 25 days for chest pain. Do not use for 24 hours after use of Viagra , Disp: 25 tablet, Rfl: 0   oxyCODONE  (ROXICODONE ) 5 MG immediate release tablet, Take 1 tablet (5 mg total) by mouth every 8 (eight) hours as needed for severe pain (pain score 7-10)., Disp: 30 tablet, Rfl: 0   pantoprazole  (PROTONIX ) 40 MG tablet, Take 1 tablet (40 mg total) by mouth daily., Disp: 30 tablet, Rfl: 2   PRESCRIPTION MEDICATION, Inhale into the lungs at bedtime. CPAP, Disp: , Rfl:     rivaroxaban  (XARELTO ) 20 MG TABS tablet, Take 1 tablet (20 mg total) by mouth daily with supper., Disp: 30 tablet, Rfl: 3   rosuvastatin  (CRESTOR ) 10 MG tablet, Take 1 tablet (10 mg total) by mouth daily., Disp: 90 tablet, Rfl: 3   semaglutide -weight management (WEGOVY ) 0.25 MG/0.5ML SOAJ SQ injection, Inject 0.25 mg into the skin once a week., Disp: 2 mL, Rfl: 0   sildenafil  (VIAGRA ) 100 MG tablet, Take 1 tablet (100 mg total) by mouth daily as needed for erectile dysfunction., Disp: 30  tablet, Rfl: 3   tirzepatide  (ZEPBOUND ) 7.5 MG/0.5ML Pen, Inject 7.5 mg into the skin once a week., Disp: 2 mL, Rfl: 0   Vitamin D , Ergocalciferol , (DRISDOL ) 1.25 MG (50000 UNIT) CAPS capsule, Take 1 capsule (50,000 Units total) by mouth every 7 (seven) days., Disp: 12 capsule, Rfl: 3  Allergies  Allergen Reactions   Dust Mite Extract    Statins     Muscle aches   Tree Extract Other (See Comments)    Intolerance Grass          Objective:  Physical Exam  General: AAO x3, NAD  Dermatological: Skin is warm, dry and supple bilateral.  There are no open sores, no preulcerative lesions, no rash or signs of infection present.  Vascular: Dorsalis Pedis artery and Posterior Tibial artery pedal pulses are 2/4 bilateral with immedate capillary fill time.  There is no pain with calf compression, swelling, warmth, erythema.   Neruologic: Grossly intact via light touch bilateral.   Musculoskeletal: Decreased medial arch height upon weightbearing.  Unable to appreciate any area of pinpoint tenderness.  Flexor, extensor tendons appear to be intact.  He has a negative Tinel's sign insertion of plantar fascia at the heel as well as the arch of the foot.  No pain to the Achilles tendon.  Decreased range of motion of the first MTPJ.  MMT 5/5.  Gait: Unassisted, Nonantalgic.       Assessment:   Pes planovalgus with plantar fasciitis     Plan:  -Treatment options discussed including all alternatives, risks, and  complications -Etiology of symptoms were discussed - X-rays were obtained reviewed.  Multiple views obtained.  There is no evidence of acute fracture.  Changes present of the first MPJ.  Calcaneal spurs present. -Will check insurance for coverage of inserts but discussed likely not covered.  Dispensed power step inserts.  Discussed supportive shoe gear and changes in shoes -Stretching/icing daily - He does have some nerve symptoms but pain is more localized.  If symptoms persist consider nerve testing.  Return if symptoms worsen or fail to improve.  Donnice JONELLE Fees DPM

## 2024-06-05 ENCOUNTER — Ambulatory Visit: Payer: Medicare (Managed Care) | Admitting: Rehabilitative and Restorative Service Providers"

## 2024-06-05 ENCOUNTER — Encounter: Payer: Self-pay | Admitting: Rehabilitative and Restorative Service Providers"

## 2024-06-05 DIAGNOSIS — M25562 Pain in left knee: Secondary | ICD-10-CM | POA: Diagnosis not present

## 2024-06-05 DIAGNOSIS — R600 Localized edema: Secondary | ICD-10-CM | POA: Diagnosis not present

## 2024-06-05 DIAGNOSIS — M6281 Muscle weakness (generalized): Secondary | ICD-10-CM

## 2024-06-05 DIAGNOSIS — R262 Difficulty in walking, not elsewhere classified: Secondary | ICD-10-CM | POA: Diagnosis not present

## 2024-06-05 DIAGNOSIS — M25662 Stiffness of left knee, not elsewhere classified: Secondary | ICD-10-CM

## 2024-06-05 NOTE — Therapy (Signed)
 OUTPATIENT PHYSICAL THERAPY TREATMENT   Patient Name: Richard Walker MRN: [id number] DOB:[birth year], 68 y.o., male Today's Date: 06/05/2024  END OF SESSION:  PT End of Session - 06/05/24 1102     Visit Number 19    Number of Visits 24    Date for Recertification  06/27/24    Authorization Type [employer] 20% coinsurance    Authorization - Number of Visits 33    Progress Note Due on Visit 20    PT Start Time 1059    PT Stop Time 1138    PT Time Calculation (min) 39 min    Activity Tolerance Patient tolerated treatment well    Behavior During Therapy WFL for tasks assessed/performed              Past Medical History:  Diagnosis Date   Arthritis    DDD (degenerative disc disease), lumbar    L3/L4   Diverticulosis    Dysrhythmia    SVT s/p ablation   Dysrhythmia    PAF   GERD (gastroesophageal reflux disease)    Hyperlipidemia    Low sperm motility    Myocardial infarction (HCC)    Pre-diabetes    Sleep apnea    uses cpap    SVT (supraventricular tachycardia)    Past Surgical History:  Procedure Laterality Date   AMPUTATION Left 07/15/2018   Procedure: . REPAIR OF LEFT INDEX AND LONG FINGER BY FUSION /  REPAIR OF RING FINGER;  Surgeon: Sebastian Lenis, MD;  Location: [facility] OR;  Service: Orthopedics;  Laterality: Left;   BACK SURGERY     BLALOCK PROCEDURE     Ablation   CARDIAC CATHETERIZATION N/A 12/22/2015   Procedure: Left Heart Cath and Coronary Angiography;  Surgeon: Peter M Swaziland, MD;  Location: [facility] INVASIVE CV LAB;  Service: Cardiovascular;  Laterality: N/A;   CARDIAC ELECTROPHYSIOLOGY STUDY AND ABLATION     CORONARY STENT INTERVENTION N/A 05/01/2024   Procedure: CORONARY STENT INTERVENTION;  Surgeon: Ladona Heinz, MD;  Location: [facility] INVASIVE CV LAB;  Service: Cardiovascular;  Laterality: N/A;   ELECTROPHYSIOLOGIC STUDY N/A 02/19/2016   Procedure: SVT Ablation;  Surgeon: Danelle LELON Birmingham, MD;  Location: [facility] INVASIVE CV LAB;  Service: Cardiovascular;   Laterality: N/A;   HAND SURGERY     LEFT HEART CATH AND CORONARY ANGIOGRAPHY N/A 05/01/2024   Procedure: LEFT HEART CATH AND CORONARY ANGIOGRAPHY;  Surgeon: Ladona Heinz, MD;  Location: [facility] INVASIVE CV LAB;  Service: Cardiovascular;  Laterality: N/A;   septal reconstruction  2017   TOTAL KNEE ARTHROPLASTY Left 03/27/2024   Procedure: ARTHROPLASTY, KNEE, TOTAL, LEFT;  Surgeon: Addie Cordella Hamilton, MD;  Location: [facility] OR;  Service: Orthopedics;  Laterality: Left;   TURBINATE REDUCTION Bilateral 07/2016   WISDOM TOOTH EXTRACTION     Patient Active Problem List   Diagnosis Date Noted   Use of opiates for therapeutic purposes 05/09/2024   CAD (coronary artery disease) 05/01/2024   Arthritis of left knee 04/15/2024   S/P TKR (total knee replacement), left 03/29/2024   S/P total knee arthroplasty, left 03/27/2024   Constipation 02/14/2024   Metabolic dysfunction-associated steatotic liver disease (MASLD) 01/13/2024   Abnormal food appetite 11/17/2023   Insulin  resistance 11/17/2023   Class 2 severe obesity due to excess calories with serious comorbidity and body mass index (BMI) of 35.0 to 35.9 in adult 11/03/2023   Prediabetes 11/03/2023   Degenerative disc disease, lumbar 12/09/2022   Morbid obesity (HCC) 10/26/2022   Bronchitis 01/29/2022   OSA  on CPAP 02/19/2021   Bite, snake 01/28/2021   Neck pain 11/13/2020   Vertigo 11/13/2020   Hypertension 11/03/2020   Contusion of bone 10/23/2020   Degenerative arthritis of knee, bilateral 10/23/2020   Concussion with no loss of consciousness 10/23/2020   Paroxysmal atrial fibrillation (HCC) 05/22/2019   Unspecified atrial flutter (HCC) 05/22/2019   Pain in right foot 03/30/2018   Perennial and seasonal allergic rhinitis 11/09/2017   History of food allergy 11/09/2017   Bronchitis, mucopurulent recurrent (HCC) 09/07/2017   SVT (supraventricular tachycardia) 04/23/2015   Abdominal pain, epigastric 06/03/2013   Vitamin D  deficiency 04/09/2013    Esophageal reflux 04/09/2013   Hepatitis B carrier (HCC) 04/09/2013   Other and unspecified hyperlipidemia 04/09/2013   Sleep apnea 04/09/2013    PCP: Dallas Maxwell, PA-C  REFERRING PROVIDER: Cordella Glendia Hutchinson, MD  REFERRING DIAG:  Diagnosis  R53.1 (ICD-10-CM) - Weakness    THERAPY DIAG:  Difficulty in walking, not elsewhere classified  Muscle weakness (generalized)  Localized edema  Acute pain of left knee  Knee stiffness, left  Rationale for Evaluation and Treatment: Rehabilitation  ONSET DATE: 03/27/2024  SUBJECTIVE:   SUBJECTIVE STATEMENT: Pt indicated feeling tired yesterday and had burning in knee more yesterday.  Unsure why.  Pt indicated 5/10 today. Pt indicated the other days before we not as bad.  Reported some sharp pains in thigh at times.   PERTINENT HISTORY: L3-4 lumbar DDD; pre-diabetes; previous back surgery; cardiac history  PAIN:  NPRS scale: 5/10  Pain location: Lt Knee (anterior), thigh  Pain description: not as sharp as before but still noted.  Aggravating factors: Sleeping  Relieving factors: Pain medications and ice  PRECAUTIONS: Back  RED FLAGS: None   WEIGHT BEARING RESTRICTIONS: No  FALLS:  Has patient fallen in last 6 months? No  LIVING ENVIRONMENT: Lives with: lives with their family, lives with their son, and lives with their daughter Lives in: House/apartment Stairs: Avoids stairs, walker and help for stairs now Has following equipment at home: Vannie - 2 wheeled  OCCUPATION: Retired  PLOF: Independent  PATIENT GOALS: Yard work, be more active   OBJECTIVE:  Note: Objective measures were completed at Evaluation unless otherwise noted.  DIAGNOSTIC FINDINGS: See chart  PATIENT SURVEYS:  PSFS: THE PATIENT SPECIFIC FUNCTIONAL SCALE  Place score of 0-10 (0 = unable to perform activity and 10 = able to perform activity at the same level as before injury or problem)  Activity Date: 04/04/2024 04/26/2024 06/05/2024   Sleep 0 4 7  2.  Sitting 2 6 6   3.  Moving around (walking) 2 7 6   4.  Showering 1 10 10   Total Score 1.25  6.75 7.25    Total Score = Sum of activity scores/number of activities  Minimally Detectable Change: 3 points (for single activity); 2 points (for average score)   COGNITION: 04/04/2024 Overall cognitive status: Within functional limits for tasks assessed     SENSATION: 04/04/2024 [facility]  EDEMA:  04/04/2024 Noted and not objectively assessed   LOWER EXTREMITY ROM:  Active ROM Left/Right 04/04/2024 Left  04/05/2024 Left 04/10/2024 04/13/24 04/17/24 Left 04/19/24 Left 04/26/2024 Left 05/25/2024  Knee flexion 62/145 82 with belt supine 100 Supine AROM heel slide P: 100 (guarding) Seated P: 112* Supine  A: 96* A: 114 Active 125   Knee extension -5/0 -3 supine -10 in seated LAQ AROM A: -7 (seated LAQ) LAQ A: -5* Standing A: -3* P: 0 Active lacks 3 degrees -2 seated LAQ Lt    (  Blank rows = not tested)  LOWER EXTREMITY STRENGTH:  MMT Right  04/04/2024 Left 04/04/2024 Right 04/27/2024 Left 04/30/2024 Left 05/08/2024 Left 05/25/2024  Hip flexion        Hip extension        Hip abduction        Hip adduction        Hip internal rotation        Hip external rotation        Knee flexion        Knee extension 4+ 2+ 81.1 in pounds (236 body weight) 25.7 lbs 31,.9, 28.4 4+/5 52, 48 lbs 5/5  Ankle dorsiflexion        Ankle plantarflexion        Ankle inversion        Ankle eversion         (Blank rows = not tested)  GAIT: 06/05/2024: Independent ambulation in clinic.    05/04/2024: SPC use today in ambulation in clinic.   04/30/2024: Ambulation c FWW today with improve sequencing noted compared to [facility] in last visits.   04/04/2024 Distance walked: 50 feet Assistive device utilized: Environmental consultant - 2 wheeled Level of assistance: Modified independence Comments: Richard Walker was assistive device free before surgery                    TREATMENT       DATE:  06/05/2024 Therex: Recumbent bike seat 5 lvl 5 10 mins.  (:15 second faster interval top of each minute from 2 mins to 7 mins) Incline gastroc stretch 30 sec x 5 bilateral  Knee extension machine double leg up, single leg lowering Lt 3 x 15 15 lbs (in available painfree range) Knee flexion machine Lt leg 3 x 10 15 lbs  Review of mobility intervention in HEP including seated LAQ, supine 90 deg hip flexion LAQ Lt leg x 10 each Supine bridge 2 x 10 2-3 sec hold   Neuro Re-ed (balance improvements, coordination ) SLS with corner touching contralateral leg x 8 each, performed bilaterally with SBA and occasional HHA on bar.    TherActivity (to improve stairs, squatting, transfers Step on over and down WB on Lt leg with light hand assist on bar 4 inch step x 10 Lateral step down 4 inch step x 10 WB on Lt leg   TREATMENT       DATE: 05/31/2024 Therex: Recumbent bike seat 5 lvl 5 8 mins.  (:15 second faster interval top of each minute from 2 mins to 7 mins) Incline gastroc stretch 30 sec x 5 bilateral  Knee extension machine double leg up, single leg lowering Lt 3 x 15 15 lbs (in available painfree range) Knee flexion machine Lt leg 3 x 10 15 lbs   Discussed gym membership options with suggestion to check with insurance about financial coverage.    TherActivity (to improve stairs, squatting, transfers Leg press double leg 137 lbs slow lowering x 15 Leg press single leg Lt slow lowering 2 x 15 68 lbs  Flight of stairs down with reciprocal gait pattern with hand rail assist.  Focused on controlled lowering.   TREATMENT       DATE: 05/28/2024 Therex: Recumbent bike seat 5 lvl 5 8 mins.  (:10 second faster interval top of each minute from 2 mins to 7 mins) Knee extension machine double leg up, single leg lowering Lt 3 x 10 15 lbs (in available painfree range) Incline gastroc stretch 30 sec x 5 bilateral  Neuro Re - ed SLS on foam with contralateral leg tapping corners of mat x 8 each WB on  Lt leg Tandem stance on foam 1 min x 1 bilateral with occasional HHA    TherActivity (to improve stairs, squatting, transfers Lateral step down WB on Lt 6 inch step 2 x 10  Leg press double leg 137 lbs slow lowering x 15 Leg press single leg Lt slow lowering 2 x 15 68 lbs   PATIENT EDUCATION:  Eval:  Education details: See above Person educated: Patient and Child(ren) Education method: Explanation, Demonstration, Tactile cues, Verbal cues, and Handouts Education comprehension: verbalized understanding, returned demonstration, verbal cues required, tactile cues required, and needs further education  HOME EXERCISE PROGRAM: Access Code: [email or web address] URL: [email or web address] Date: 04/26/2024 Prepared by: Lamar Ivory  Exercises - Seated Knee Flexion AAROM  - 5 x daily - 7 x weekly - 1 sets - 10 reps - 10 seconds hold - Supine Quadricep Sets  - 5 x daily - 7 x weekly - 2 sets - 10 reps - 5 second hold - Small Range Straight Leg Raise  - 2 x daily - 7 x weekly - 3-5 sets - 5 reps - 3 seconds hold - Tandem Stance  - 1 x daily - 7 x weekly - 1 sets - 10 reps - 20 second hold - Single Leg Stance  - 1 x daily - 7 x weekly - 1 sets - 10 reps - 10 seconds hold - Prone Quadricep Stretch with Strap  - 2 x daily - 7 x weekly - 1 sets - 5-10 reps - 20 seconds hold - Prone Knee Extension with Ankle Weight  - 2 x daily - 7 x weekly - 1 sets - 1 reps - 3-5 minutes hold  ASSESSMENT:  CLINICAL IMPRESSION: PSFS reassessed with mild improvement compared to last reassessment.  Continued difficulty mild to moderate with walking, standing activity.  Ambulation today in clinic independent with good overall presentation noted despite symptoms reported yesterday.  Continued to show gains in WB loading on steps.    OBJECTIVE IMPAIRMENTS: Abnormal gait, decreased activity tolerance, decreased endurance, decreased knowledge of condition, difficulty walking, decreased ROM, decreased strength,  increased edema, impaired perceived functional ability, and pain.   ACTIVITY LIMITATIONS: bending, sitting, standing, squatting, sleeping, stairs, transfers, bed mobility, dressing, and locomotion level  PARTICIPATION LIMITATIONS: driving, community activity, and yard work  PERSONAL FACTORS: L3-4 lumbar DDD; pre-diabetes; previous back surgery; cardiac history are also affecting patient's functional outcome.    REHAB POTENTIAL: Good  CLINICAL DECISION MAKING: Stable/uncomplicated  EVALUATION COMPLEXITY: Low   GOALS: Goals reviewed with patient? Yes  SHORT TERM GOALS: Target date: 05/16/2024 Richard Walker will be independent with his day 1 home exercise program Baseline: Started 04/04/2024 Goal status: Met 04/25/2024  2.  Improve left knee active range of motion to 3 - 0 -100 degrees Baseline: 5 - 0 - 62 degrees Goal status: Met 04/25/2024  3.  Improve left quadriceps strength as assessed by MMT and functional testing Baseline: 2+/5 at evaluation Goal status: Ongoing   05/17/2024  4.  Richard Walker will be independent with a single-point cane for ambulation outside the home Baseline: Wheeled walker full-time Goal status: Met 04/25/2024   LONG TERM GOALS: Target date: 06/27/2024  Improve patient specific functional score to at least 5  Goal status: Met 04/26/2024  2.  Richard Walker will report left knee pain consistently 0-3/10 on the numeric pain rating scale  Goal status: Ongoing   05/17/2024  3.  Improve left knee AROM to 2 - 0 - 110 degrees or better  Goal status: Partially Met 04/26/2024  4.  Improve left quadriceps strength as assessed by MMT and Richard Walker's ability to ambulate without an assistive device 100% of the time  Goal status: Ongoing   05/17/2024  5.  Richard Walker will be independent with his long-term maintenance home exercise program at discharge  Goal status: Ongoing   05/17/2024    PLAN:  PT FREQUENCY: 2-3x/week  PT DURATION: 12 weeks  PLANNED INTERVENTIONS: 97750- Physical Performance Testing,  97110-Therapeutic exercises, 97530- Therapeutic activity, 97112- Neuromuscular re-education, 97535- Self Care, [id number]- Manual therapy, [phone]- Gait training, [phone]- Vasopneumatic device, Patient/Family education, Balance training, Stair training, Joint mobilization, and Cryotherapy  PLAN FOR NEXT SESSION: Measurement day.   NEXT MD VISIT: 06/18/2024   Richard Walker Silvan, PT, DPT, OCS, ATC 06/05/24  11:39 AM

## 2024-06-06 ENCOUNTER — Other Ambulatory Visit (HOSPITAL_BASED_OUTPATIENT_CLINIC_OR_DEPARTMENT_OTHER): Payer: Self-pay

## 2024-06-06 ENCOUNTER — Encounter: Payer: Self-pay | Admitting: Medical

## 2024-06-06 ENCOUNTER — Encounter (INDEPENDENT_AMBULATORY_CARE_PROVIDER_SITE_OTHER): Payer: Self-pay | Admitting: Internal Medicine

## 2024-06-06 ENCOUNTER — Ambulatory Visit (INDEPENDENT_AMBULATORY_CARE_PROVIDER_SITE_OTHER): Payer: Medicare (Managed Care) | Admitting: Internal Medicine

## 2024-06-06 VITALS — BP 119/82 | HR 77 | Temp 97.8°F | Ht 68.0 in | Wt 219.0 lb

## 2024-06-06 DIAGNOSIS — R7303 Prediabetes: Secondary | ICD-10-CM

## 2024-06-06 DIAGNOSIS — G4733 Obstructive sleep apnea (adult) (pediatric): Secondary | ICD-10-CM | POA: Diagnosis not present

## 2024-06-06 DIAGNOSIS — Z6833 Body mass index (BMI) 33.0-33.9, adult: Secondary | ICD-10-CM

## 2024-06-06 DIAGNOSIS — Z6835 Body mass index (BMI) 35.0-35.9, adult: Secondary | ICD-10-CM

## 2024-06-06 DIAGNOSIS — K760 Fatty (change of) liver, not elsewhere classified: Secondary | ICD-10-CM | POA: Diagnosis not present

## 2024-06-06 DIAGNOSIS — I10 Essential (primary) hypertension: Secondary | ICD-10-CM | POA: Diagnosis not present

## 2024-06-06 DIAGNOSIS — E66812 Obesity, class 2: Secondary | ICD-10-CM

## 2024-06-06 MED ORDER — ZEPBOUND 7.5 MG/0.5ML ~~LOC~~ SOAJ
7.5000 mg | SUBCUTANEOUS | 0 refills | Status: DC
  Filled 2024-06-06: qty 2, 28d supply, fill #0

## 2024-06-06 NOTE — Assessment & Plan Note (Signed)
 On Zepbound  for pharmacoprophylaxis.  Has been educated on the carbohydrate insulin  model for obesity.  He will continue to work on reducing simple and added sugars in his diet.

## 2024-06-06 NOTE — Assessment & Plan Note (Signed)
 Significant weight loss of 45 pounds since March, equating to a 17% reduction in body weight. Current weight loss is considered fast. Emphasis on maintaining muscle mass during weight loss. - Continue current weight management program. - Encourage balanced diet with adequate protein and fiber. - Consider protein shakes if unable to meet daily protein requirements. - Encourage joining a fitness club with medically trained staff for exercise guidance. - Consider decreasing GLP-1 if persistent muscle loss  Loss of approximately 18 pounds of muscle mass, attributed to rapid weight loss and insufficient protein intake. Emphasized the importance of maintaining muscle mass to support mobility and overall health. - Increase protein intake to at least 90 grams per day. - Consider protein shakes to supplement protein intake. - Encourage resistance training and physical activity to preserve muscle mass.

## 2024-06-06 NOTE — Assessment & Plan Note (Signed)
 He has hepatic steatosis on CT scan completed in January of this year.  Fibrosis 4 Score = 1.3  He does not drink alcohol and is not on steatogenic medication.  Losing 15% of body weight may improve condition.  He is also working on reducing saturated fats simple and processed sugars in his diet and is engaging in physical activity.  Continue current weight management strategy inclusive of GLP-1

## 2024-06-06 NOTE — Assessment & Plan Note (Signed)
 Severe obstructive sleep apnea with current use of CPAP. Reports discomfort with CPAP use following significant weight loss. Acknowledges the importance of continued CPAP use due to severe apnea and its impact on heart rhythm. - Continue CPAP use nightly.

## 2024-06-06 NOTE — Progress Notes (Signed)
 Office: [phone]  /  Fax: [phone]  Weight Summary and Body Composition Analysis (BIA)  Vitals Temp: 97.8 F (36.6 C) BP: 119/82 Pulse Rate: 77 SpO2: 97 %   Anthropometric Measurements Height: 5' 8 (1.727 m) Weight: 219 lb (99.3 kg) BMI (Calculated): 33.31 Weight at Last Visit: 226 lb Weight Lost Since Last Visit: 7 lb Weight Gained Since Last Visit: 0 lb Starting Weight: 264 lb Total Weight Loss (lbs): 45 lb (20.4 kg) Peak Weight: 270 lb   Body Composition  Body Fat %: 32.8 % Fat Mass (lbs): 71.8 lbs Muscle Mass (lbs): 140 lbs Total Body Water  (lbs): 105 lbs Visceral Fat Rating : 20    RMR: 2405  Today's Visit #: 9  Starting Date: 11/03/23   Subjective   Chief Complaint: Obesity  Interval History Discussed the use of AI scribe software for clinical note transcription with the patient, who gave verbal consent to proceed.  History of Present Illness EON ZUNKER is a 68 year old male with obesity who presents for weight management   He has ongoing knee issues, describing the sensation as 'in and out.' He is attending physical therapy and is exploring fitness centers with swimming facilities. He is allowed to shower but not soak his knee in water .  He has lost 45 pounds since March due to dietary changes and increased physical activity. He is concerned about muscle mass loss, noting a decrease from 162 pounds to 140 pounds of muscle. Despite eating more, he often skips meals due to lack of hunger. He tries to cook at home, focusing on beans and Middle Eastern dishes, but sometimes eats fast food like [employer] and [employer]. He uses protein shakes to supplement his diet but acknowledges needing more protein to prevent muscle loss.  He is on Zepbound .  He has a history of heart surgery and is on blood thinners. He feels fatigued, attributing this to his heart medications. He has stopped consuming caffeine  and hookah since his surgeries. He uses a CPAP  machine for sleep apnea but feels uncomfortable with it after his weight loss.  He uses Miralax for constipation but not daily to avoid dependency, reporting bowel movements every three days.  He mentions a past A1C level of 5.9 and is due for a follow-up test. He plans to discuss his fatigue and energy levels with his cardiologist at an upcoming appointment.     Challenges affecting patient progress: having difficulty with meal prep and planning and medical comorbidities.    Pharmacotherapy for weight management: He is currently taking Zepbound  with adequate clinical response  and experiencing the following side effects: muscle loss.   Assessment and Plan   Treatment Plan For Obesity:  Recommended Dietary Goals  Porter is currently in the action stage of change. As such, his goal is to continue weight management plan. He has agreed to: continue current plan  Behavioral Health and Counseling  We discussed the following behavioral modification strategies today: increasing lean protein intake to established goals, decreasing simple carbohydrates , increasing vegetables, increasing fiber rich foods, avoiding skipping meals, increasing water  intake , and work on meal planning and preparation.  Additional education and resources provided today: None  Recommended Physical Activity Goals  Jagger has been advised to work up to 150 minutes of moderate intensity aerobic activity a week and strengthening exercises 2-3 times per week for cardiovascular health, weight loss maintenance and preservation of muscle mass.  He has agreed to :  Think about  enjoyable ways to increase daily physical activity and overcoming barriers to exercise, Increase physical activity in their day and reduce sedentary time (increase NEAT)., Increase volume of physical activity to a goal of 240 minutes a week, and Combine aerobic and strengthening exercises for efficiency and improved cardiometabolic health.  Medical  Interventions and Pharmacotherapy  We discussed various medication options to help Asante with his weight loss efforts and we both agreed to : Adequate clinical response to anti-obesity medication, continue current regimen and do not recommend further increases in GLP-1 due to decreases in muscle mass  Associated Conditions Impacted by Obesity Treatment  Assessment & Plan OSA on CPAP Severe obstructive sleep apnea with current use of CPAP. Reports discomfort with CPAP use following significant weight loss. Acknowledges the importance of continued CPAP use due to severe apnea and its impact on heart rhythm. - Continue CPAP use nightly. Primary hypertension Blood pressure is at goal he is on metoprolol  and ARB.  Continue current regimen Class 2 severe obesity due to excess calories with serious comorbidity and body mass index (BMI) of 35.0 to 35.9 in adult Significant weight loss of 45 pounds since March, equating to a 17% reduction in body weight. Current weight loss is considered fast. Emphasis on maintaining muscle mass during weight loss. - Continue current weight management program. - Encourage balanced diet with adequate protein and fiber. - Consider protein shakes if unable to meet daily protein requirements. - Encourage joining a fitness club with medically trained staff for exercise guidance. - Consider decreasing GLP-1 if persistent muscle loss  Loss of approximately 18 pounds of muscle mass, attributed to rapid weight loss and insufficient protein intake. Emphasized the importance of maintaining muscle mass to support mobility and overall health. - Increase protein intake to at least 90 grams per day. - Consider protein shakes to supplement protein intake. - Encourage resistance training and physical activity to preserve muscle mass. Prediabetes On Zepbound  for pharmacoprophylaxis.  Has been educated on the carbohydrate insulin  model for obesity.  He will continue to work on reducing  simple and added sugars in his diet. Metabolic dysfunction-associated steatotic liver disease (MASLD) He has hepatic steatosis on CT scan completed in January of this year.  Fibrosis 4 Score = 1.3  He does not drink alcohol and is not on steatogenic medication.  Losing 15% of body weight may improve condition.  He is also working on reducing saturated fats simple and processed sugars in his diet and is engaging in physical activity.  Continue current weight management strategy inclusive of GLP-1       Assessment and Plan      Objective   Physical Exam:  Blood pressure 119/82, pulse 77, temperature 97.8 F (36.6 C), height 5' 8 (1.727 m), weight 219 lb (99.3 kg), SpO2 97%. Body mass index is 33.3 kg/m.  General: He is overweight, cooperative, alert, well developed, and in no acute distress. PSYCH: Has normal mood, affect and thought process.   HEENT: EOMI, sclerae are anicteric. Lungs: Normal breathing effort, no conversational dyspnea. Extremities: No edema.  Neurologic: No gross sensory or motor deficits. No tremors or fasciculations noted.    Diagnostic Data Reviewed:  BMET    Component Value Date/Time   NA 138 04/26/2024 1232   K 5.0 04/26/2024 1232   CL 99 04/26/2024 1232   CO2 24 04/26/2024 1232   GLUCOSE 87 04/26/2024 1232   GLUCOSE 98 03/20/2024 1450   BUN 14 04/26/2024 1232   CREATININE 1.20 04/26/2024 1232  CREATININE 1.18 02/12/2016 1508   CALCIUM  10.0 04/26/2024 1232   GFRNONAA >60 03/20/2024 1450   GFRNONAA >89 03/04/2014 1456   GFRAA >60 07/15/2018 1324   GFRAA >89 03/04/2014 1456   Lab Results  Component Value Date   HGBA1C 5.9 01/18/2024   HGBA1C 5.7 12/24/2015   Lab Results  Component Value Date   INSULIN  25.3 (H) 11/03/2023   Lab Results  Component Value Date   TSH 1.81 01/20/2024   CBC    Component Value Date/Time   WBC 6.8 05/16/2024 1515   WBC 6.7 03/20/2024 1450   RBC 5.15 05/16/2024 1515   RBC 5.67 03/20/2024 1450    HGB 14.5 05/16/2024 1515   HCT 44.9 05/16/2024 1515   PLT 244 05/16/2024 1515   MCV 87 05/16/2024 1515   MCH 28.2 05/16/2024 1515   MCH 28.6 03/20/2024 1450   MCHC 32.3 05/16/2024 1515   MCHC 33.3 03/20/2024 1450   RDW 13.2 05/16/2024 1515   Iron Studies    Component Value Date/Time   IRON 69 06/28/2018 1611   Lipid Panel     Component Value Date/Time   CHOL 209 (H) 01/18/2024 0935   TRIG 212.0 (H) 01/18/2024 0935   HDL 42.40 01/18/2024 0935   CHOLHDL 5 01/18/2024 0935   VLDL 42.4 (H) 01/18/2024 0935   LDLCALC 124 (H) 01/18/2024 0935   LDLDIRECT 150.0 03/10/2023 0951   Hepatic Function Panel     Component Value Date/Time   PROT 6.9 01/18/2024 0935   PROT 6.9 11/03/2023 1117   ALBUMIN 4.3 01/18/2024 0935   ALBUMIN 4.3 11/03/2023 1117   AST 16 01/18/2024 0935   ALT 23 01/18/2024 0935   ALKPHOS 83 01/18/2024 0935   BILITOT 0.4 01/18/2024 0935   BILITOT 0.3 11/03/2023 1117   BILIDIR 0.1 01/06/2017 1600      Component Value Date/Time   TSH 1.81 01/20/2024 1057   Nutritional Lab Results  Component Value Date   VD25OH 55.49 01/20/2024   VD25OH 35.78 09/12/2023   VD25OH 24.68 (L) 03/10/2023    Medications: Outpatient Encounter Medications as of 06/06/2024  Medication Sig Note   albuterol  (PROAIR  HFA) 108 (90 Base) MCG/ACT inhaler Inhale 1-2 puffs into the lungs every 6 (six) hours as needed for wheezing or shortness of breath.    aspirin  EC 81 MG tablet Take 1 tablet (81 mg total) by mouth daily. Swallow whole.    azelastine  (ASTELIN ) 0.1 % nasal spray 1-2 sprays per nostril 2 times daily as needed.    Blood Pressure Monitoring (BLOOD PRESSURE CUFF) MISC MONITOR BLOOD PRESSURE ONCE DAILY GOAL OF 130/80    budesonide -glycopyrrolate -formoterol  (BREZTRI  AEROSPHERE) 160-9-4.8 MCG/ACT AERO inhaler Inhale 2 puffs into the lungs in the morning and at bedtime.    cholecalciferol  (VITAMIN D3) 25 MCG (1000 UNIT) tablet Take 4,000 Units by mouth daily.    clopidogrel   (PLAVIX ) 75 MG tablet Take 1 tablet (75 mg total) by mouth daily.    docusate sodium  (COLACE) 100 MG capsule Take 1 capsule (100 mg total) by mouth 2 (two) times daily.    ezetimibe  (ZETIA ) 10 MG tablet Take 1 tablet (10 mg total) by mouth daily.    fenofibrate  (TRICOR ) 145 MG tablet Take 1 tablet (145 mg total) by mouth daily.    fenofibrate  (TRICOR ) 145 MG tablet Take 1 tablet (145 mg total) by mouth daily.    flecainide  (TAMBOCOR ) 150 MG tablet Take 1 tablet (150 mg total) by mouth 2 (two) times daily.  gabapentin  (NEURONTIN ) 100 MG capsule Take 1 capsule (100 mg total) by mouth 2 (two) times daily. 04/30/2024: For after procedure    losartan  (COZAAR ) 25 MG tablet Take 1 tablet (25 mg total) by mouth daily.    losartan  (COZAAR ) 25 MG tablet Take 1 tablet (25 mg total) by mouth daily.    Magnesium  250 MG TABS Take 250 mg by mouth once a week.    metFORMIN  (GLUCOPHAGE ) 500 MG tablet TAKE 1 TABLET(500 MG) BY MOUTH TWICE DAILY WITH A MEAL    methocarbamol  (ROBAXIN ) 500 MG tablet Take 1 tablet (500 mg total) by mouth every 8 (eight) hours as needed for muscle spasms.    metoprolol  tartrate (LOPRESSOR ) 25 MG tablet Take one tablet by mouth twice a day.  May take one additional tablet by mouth as needed for breakthrough palpitations.    nitroGLYCERIN  (NITROSTAT ) 0.4 MG SL tablet Place 1 tablet (0.4 mg total) under the tongue every 5 (five) minutes as needed for up to 25 days for chest pain. Do not use for 24 hours after use of Viagra     oxyCODONE  (ROXICODONE ) 5 MG immediate release tablet Take 1 tablet (5 mg total) by mouth every 8 (eight) hours as needed for severe pain (pain score 7-10).    pantoprazole  (PROTONIX ) 40 MG tablet Take 1 tablet (40 mg total) by mouth daily.    PRESCRIPTION MEDICATION Inhale into the lungs at bedtime. CPAP    rivaroxaban  (XARELTO ) 20 MG TABS tablet Take 1 tablet (20 mg total) by mouth daily with supper.    rosuvastatin  (CRESTOR ) 10 MG tablet Take 1 tablet (10 mg total) by  mouth daily.    sildenafil  (VIAGRA ) 100 MG tablet Take 1 tablet (100 mg total) by mouth daily as needed for erectile dysfunction.    Vitamin D , Ergocalciferol , (DRISDOL ) 1.25 MG (50000 UNIT) CAPS capsule Take 1 capsule (50,000 Units total) by mouth every 7 (seven) days.    [DISCONTINUED] semaglutide -weight management (WEGOVY ) 0.25 MG/0.5ML SOAJ SQ injection Inject 0.25 mg into the skin once a week.    [DISCONTINUED] tirzepatide  (ZEPBOUND ) 7.5 MG/0.5ML Pen Inject 7.5 mg into the skin once a week.    tirzepatide  (ZEPBOUND ) 7.5 MG/0.5ML Pen Inject 7.5 mg into the skin once a week.    No facility-administered encounter medications on file as of 06/06/2024.     Follow-Up   Return in about 4 weeks (around 07/04/2024) for For Weight Mangement with Dr. Francyne.SABRA He was informed of the importance of frequent follow up visits to maximize his success with intensive lifestyle modifications for his multiple health conditions.  Attestation Statement   Reviewed by clinician on day of visit: allergies, medications, problem list, medical history, surgical history, family history, social history, and previous encounter notes.     Lucas Francyne, MD

## 2024-06-06 NOTE — Assessment & Plan Note (Signed)
 Blood pressure is at goal he is on metoprolol  and ARB.  Continue current regimen

## 2024-06-07 ENCOUNTER — Other Ambulatory Visit (HOSPITAL_BASED_OUTPATIENT_CLINIC_OR_DEPARTMENT_OTHER): Payer: Self-pay

## 2024-06-07 ENCOUNTER — Telehealth: Payer: Self-pay | Admitting: *Deleted

## 2024-06-07 ENCOUNTER — Other Ambulatory Visit: Payer: Self-pay

## 2024-06-07 ENCOUNTER — Emergency Department (HOSPITAL_BASED_OUTPATIENT_CLINIC_OR_DEPARTMENT_OTHER): Payer: Medicare (Managed Care)

## 2024-06-07 ENCOUNTER — Encounter (HOSPITAL_BASED_OUTPATIENT_CLINIC_OR_DEPARTMENT_OTHER): Payer: Self-pay | Admitting: Emergency Medicine

## 2024-06-07 ENCOUNTER — Ambulatory Visit: Payer: Medicare (Managed Care) | Admitting: Rehabilitative and Restorative Service Providers"

## 2024-06-07 ENCOUNTER — Other Ambulatory Visit: Payer: Self-pay | Admitting: Surgical

## 2024-06-07 ENCOUNTER — Encounter: Payer: Self-pay | Admitting: Rehabilitative and Restorative Service Providers"

## 2024-06-07 ENCOUNTER — Other Ambulatory Visit (HOSPITAL_COMMUNITY): Payer: Self-pay

## 2024-06-07 ENCOUNTER — Emergency Department (HOSPITAL_BASED_OUTPATIENT_CLINIC_OR_DEPARTMENT_OTHER)
Admission: EM | Admit: 2024-06-07 | Discharge: 2024-06-07 | Disposition: A | Discharge status: Home / Self Care | Payer: Medicare (Managed Care) | Attending: Emergency Medicine | Admitting: Emergency Medicine

## 2024-06-07 DIAGNOSIS — Z7982 Long term (current) use of aspirin: Secondary | ICD-10-CM | POA: Insufficient documentation

## 2024-06-07 DIAGNOSIS — Z7901 Long term (current) use of anticoagulants: Secondary | ICD-10-CM | POA: Diagnosis not present

## 2024-06-07 DIAGNOSIS — R1032 Left lower quadrant pain: Secondary | ICD-10-CM | POA: Diagnosis present

## 2024-06-07 DIAGNOSIS — R262 Difficulty in walking, not elsewhere classified: Secondary | ICD-10-CM | POA: Diagnosis not present

## 2024-06-07 DIAGNOSIS — Z87891 Personal history of nicotine dependence: Secondary | ICD-10-CM | POA: Insufficient documentation

## 2024-06-07 DIAGNOSIS — R600 Localized edema: Secondary | ICD-10-CM

## 2024-06-07 DIAGNOSIS — M25562 Pain in left knee: Secondary | ICD-10-CM

## 2024-06-07 DIAGNOSIS — Z96652 Presence of left artificial knee joint: Secondary | ICD-10-CM | POA: Insufficient documentation

## 2024-06-07 DIAGNOSIS — M6281 Muscle weakness (generalized): Secondary | ICD-10-CM

## 2024-06-07 DIAGNOSIS — M25662 Stiffness of left knee, not elsewhere classified: Secondary | ICD-10-CM

## 2024-06-07 LAB — URINALYSIS, ROUTINE W REFLEX MICROSCOPIC
Bilirubin Urine: NEGATIVE
Glucose, UA: NEGATIVE mg/dL
Ketones, ur: NEGATIVE mg/dL
Leukocytes,Ua: NEGATIVE
Nitrite: NEGATIVE
Protein, ur: NEGATIVE mg/dL
Specific Gravity, Urine: >=1.03 (ref 1.005–1.030)
pH: 6.5 (ref 5.0–8.0)

## 2024-06-07 LAB — CBC
HCT: 44.1 % (ref 39.0–52.0)
Hemoglobin: 14.4 g/dL (ref 13.0–17.0)
MCH: 27.4 pg (ref 26.0–34.0)
MCHC: 32.7 g/dL (ref 30.0–36.0)
MCV: 83.8 fL (ref 80.0–100.0)
Platelets: 204 K/uL (ref 150–400)
RBC: 5.26 MIL/uL (ref 4.22–5.81)
RDW: 13.2 % (ref 11.5–15.5)
WBC: 7.4 K/uL (ref 4.0–10.5)
nRBC: 0 % (ref 0.0–0.2)

## 2024-06-07 LAB — BASIC METABOLIC PANEL WITH GFR
Anion gap: 11 (ref 5–15)
BUN: 15 mg/dL (ref 8–23)
CO2: 25 mmol/L (ref 22–32)
Calcium: 10.3 mg/dL (ref 8.9–10.3)
Chloride: 102 mmol/L (ref 98–111)
Creatinine, Ser: 1.08 mg/dL (ref 0.61–1.24)
GFR, Estimated: >60 mL/min (ref >60)
Glucose, Bld: 98 mg/dL (ref 70–99)
Potassium: 4.5 mmol/L (ref 3.5–5.1)
Sodium: 139 mmol/L (ref 135–145)

## 2024-06-07 LAB — URINALYSIS, MICROSCOPIC (REFLEX)
Bacteria, UA: NONE SEEN
Squamous Epithelial / HPF: NONE SEEN /HPF (ref 0–5)

## 2024-06-07 MED ORDER — OXYCODONE HCL 5 MG PO TABS
5.0000 mg | ORAL_TABLET | Freq: Once | ORAL | Status: AC
  Administered 2024-06-07: 5 mg via ORAL
  Filled 2024-06-07: qty 1

## 2024-06-07 MED ORDER — OXYCODONE HCL 5 MG PO TABS
5.0000 mg | ORAL_TABLET | Freq: Three times a day (TID) | ORAL | 0 refills | Status: DC | PRN
  Filled 2024-06-07: qty 30, 10d supply, fill #0

## 2024-06-07 MED ORDER — ACETAMINOPHEN 325 MG PO TABS
650.0000 mg | ORAL_TABLET | Freq: Once | ORAL | Status: AC
  Administered 2024-06-07: 650 mg via ORAL
  Filled 2024-06-07: qty 2

## 2024-06-07 NOTE — Therapy (Signed)
 OUTPATIENT PHYSICAL THERAPY TREATMENT / PROGRESS NOTE   Patient Name: Richard Walker MRN: [id number] DOB:08/08/1956, 68 y.o., male Today's Date: 06/07/2024  Progress Note Reporting Period 04/27/2024 to 06/07/2024  See note below for Objective Data and Assessment of Progress/Goals.    END OF SESSION:  PT End of Session - 06/07/24 1103     Visit Number 20    Number of Visits 24    Date for Recertification  06/27/24    Authorization Type [employer] 20% coinsurance    Authorization - Number of Visits 33    Progress Note Due on Visit 24    PT Start Time 1100    PT Stop Time 1139    PT Time Calculation (min) 39 min    Activity Tolerance Patient tolerated treatment well    Behavior During Therapy WFL for tasks assessed/performed           Past Medical History:  Diagnosis Date   Arthritis    DDD (degenerative disc disease), lumbar    L3/L4   Diverticulosis    Dysrhythmia    SVT s/p ablation   Dysrhythmia    PAF   GERD (gastroesophageal reflux disease)    Hyperlipidemia    Low sperm motility    Myocardial infarction (HCC)    Pre-diabetes    Sleep apnea    uses cpap    SVT (supraventricular tachycardia)    Past Surgical History:  Procedure Laterality Date   AMPUTATION Left 07/15/2018   Procedure: . REPAIR OF LEFT INDEX AND LONG FINGER BY FUSION /  REPAIR OF RING FINGER;  Surgeon: Sebastian Lenis, MD;  Location: [facility] OR;  Service: Orthopedics;  Laterality: Left;   BACK SURGERY     BLALOCK PROCEDURE     Ablation   CARDIAC CATHETERIZATION N/A 12/22/2015   Procedure: Left Heart Cath and Coronary Angiography;  Surgeon: Peter M Swaziland, MD;  Location: [facility] INVASIVE CV LAB;  Service: Cardiovascular;  Laterality: N/A;   CARDIAC ELECTROPHYSIOLOGY STUDY AND ABLATION     CORONARY STENT INTERVENTION N/A 05/01/2024   Procedure: CORONARY STENT INTERVENTION;  Surgeon: Ladona Heinz, MD;  Location: [facility] INVASIVE CV LAB;  Service: Cardiovascular;  Laterality: N/A;   ELECTROPHYSIOLOGIC  STUDY N/A 02/19/2016   Procedure: SVT Ablation;  Surgeon: Danelle LELON Birmingham, MD;  Location: [facility] INVASIVE CV LAB;  Service: Cardiovascular;  Laterality: N/A;   HAND SURGERY     LEFT HEART CATH AND CORONARY ANGIOGRAPHY N/A 05/01/2024   Procedure: LEFT HEART CATH AND CORONARY ANGIOGRAPHY;  Surgeon: Ladona Heinz, MD;  Location: [facility] INVASIVE CV LAB;  Service: Cardiovascular;  Laterality: N/A;   septal reconstruction  2017   TOTAL KNEE ARTHROPLASTY Left 03/27/2024   Procedure: ARTHROPLASTY, KNEE, TOTAL, LEFT;  Surgeon: Addie Cordella Hamilton, MD;  Location: [facility] OR;  Service: Orthopedics;  Laterality: Left;   TURBINATE REDUCTION Bilateral 07/2016   WISDOM TOOTH EXTRACTION     Patient Active Problem List   Diagnosis Date Noted   Use of opiates for therapeutic purposes 05/09/2024   CAD (coronary artery disease) 05/01/2024   Arthritis of left knee 04/15/2024   S/P TKR (total knee replacement), left 03/29/2024   S/P total knee arthroplasty, left 03/27/2024   Constipation 02/14/2024   Metabolic dysfunction-associated steatotic liver disease (MASLD) 01/13/2024   Abnormal food appetite 11/17/2023   Insulin  resistance 11/17/2023   Class 2 severe obesity due to excess calories with serious comorbidity and body mass index (BMI) of 35.0 to 35.9 in adult 11/03/2023   Prediabetes  11/03/2023   Degenerative disc disease, lumbar 12/09/2022   Morbid obesity (HCC) 10/26/2022   Bronchitis 01/29/2022   OSA on CPAP 02/19/2021   Bite, snake 01/28/2021   Neck pain 11/13/2020   Vertigo 11/13/2020   Hypertension 11/03/2020   Contusion of bone 10/23/2020   Degenerative arthritis of knee, bilateral 10/23/2020   Concussion with no loss of consciousness 10/23/2020   Paroxysmal atrial fibrillation (HCC) 05/22/2019   Unspecified atrial flutter (HCC) 05/22/2019   Pain in right foot 03/30/2018   Perennial and seasonal allergic rhinitis 11/09/2017   History of food allergy 11/09/2017   Bronchitis, mucopurulent recurrent (HCC)  09/07/2017   SVT (supraventricular tachycardia) 04/23/2015   Abdominal pain, epigastric 06/03/2013   Vitamin D  deficiency 04/09/2013   Esophageal reflux 04/09/2013   Hepatitis B carrier (HCC) 04/09/2013   Other and unspecified hyperlipidemia 04/09/2013   Sleep apnea 04/09/2013    PCP: Dallas Maxwell, PA-C  REFERRING PROVIDER: Cordella Glendia Hutchinson, MD  REFERRING DIAG:  Diagnosis  R53.1 (ICD-10-CM) - Weakness    THERAPY DIAG:  Difficulty in walking, not elsewhere classified  Muscle weakness (generalized)  Localized edema  Acute pain of left knee  Knee stiffness, left  Rationale for Evaluation and Treatment: Rehabilitation  ONSET DATE: 03/27/2024  SUBJECTIVE:   SUBJECTIVE STATEMENT: Pt indicated overall progress at 50%.  Pt indicated functional has gotten better but symptoms are still noted in Lt leg.     PERTINENT HISTORY: L3-4 lumbar DDD; pre-diabetes; previous back surgery; cardiac history  PAIN:  NPRS scale: up to 5/10  Pain location: Lt Knee (anterior), thigh  Pain description: not as sharp as before but still noted.  Aggravating factors: Sleeping  Relieving factors: Pain medications and ice  PRECAUTIONS: Back  RED FLAGS: None   WEIGHT BEARING RESTRICTIONS: No  FALLS:  Has patient fallen in last 6 months? No  LIVING ENVIRONMENT: Lives with: lives with their family, lives with their son, and lives with their daughter Lives in: House/apartment Stairs: Avoids stairs, walker and help for stairs now Has following equipment at home: Vannie - 2 wheeled  OCCUPATION: Retired  PLOF: Independent  PATIENT GOALS: Yard work, be more active   OBJECTIVE:  Note: Objective measures were completed at Evaluation unless otherwise noted.  DIAGNOSTIC FINDINGS: See chart  PATIENT SURVEYS:  PSFS: THE PATIENT SPECIFIC FUNCTIONAL SCALE  Place score of 0-10 (0 = unable to perform activity and 10 = able to perform activity at the same level as before injury or  problem)  Activity Date: 04/04/2024 04/26/2024 06/05/2024  Sleep 0 4 7  2.  Sitting 2 6 6   3.  Moving around (walking) 2 7 6   4.  Showering 1 10 10   Total Score 1.25  6.75 7.25    Total Score = Sum of activity scores/number of activities  Minimally Detectable Change: 3 points (for single activity); 2 points (for average score)   COGNITION: 04/04/2024 Overall cognitive status: Within functional limits for tasks assessed     SENSATION: 04/04/2024 [facility]  EDEMA:  04/04/2024 Noted and not objectively assessed   LOWER EXTREMITY ROM:  Active ROM Left/Right 04/04/2024 Left  04/05/2024 Left 04/10/2024 04/13/24 04/17/24 Left 04/19/24 Left 04/26/2024 Left 05/25/2024 Left 06/07/2024  Knee flexion 62/145 82 with belt supine 100 Supine AROM heel slide P: 100 (guarding) Seated P: 112* Supine  A: 96* A: 114 Active 125  135 AROM heel slide  Knee extension -5/0 -3 supine -10 in seated LAQ AROM A: -7 (seated LAQ) LAQ A: -5* Standing  A: -3* P: 0 Active lacks 3 degrees -2 seated LAQ Lt  0 in supine quad set   (Blank rows = not tested)  LOWER EXTREMITY STRENGTH:  MMT Right  04/04/2024 Left 04/04/2024 Right 04/27/2024 Left 04/30/2024 Left 05/08/2024 Left 05/25/2024 Left 06/07/2024  Hip flexion         Hip extension         Hip abduction         Hip adduction         Hip internal rotation         Hip external rotation         Knee flexion         Knee extension 4+ 2+ 81.1 in pounds (236 body weight) 25.7 lbs 31,.9, 28.4 4+/5 52, 48 lbs 5/5 54.5, 54 lbs  Ankle dorsiflexion         Ankle plantarflexion         Ankle inversion         Ankle eversion          (Blank rows = not tested)  GAIT: 06/05/2024: Independent ambulation in clinic.    05/04/2024: SPC use today in ambulation in clinic.   04/30/2024: Ambulation c FWW today with improve sequencing noted compared to [facility] in last visits.   04/04/2024 Distance walked: 50 feet Assistive device utilized: Environmental consultant - 2 wheeled Level of  assistance: Modified independence Comments: Keyron was assistive device free before surgery                    TREATMENT       DATE: 06/07/2024 Therex: Recumbent bike seat 5 lvl 5 10 mins. Incline gastroc stretch 30 sec x 5 bilateral  Knee extension machine double leg up, single leg lowering Lt 2 x 15 20lbs (in available painfree range) Knee flexion machine Lt leg 3 x 10 20 lbs  Continued verbal review of gym based exercise options with machines, bikes etc.  Advice given on progressive resistance changes (increased weight, reps to challenge as able)  TherActivity (to improve stairs, squatting, transfers Step on over and down WB on Lt leg with light hand assist on bar 6 inch step 2 x 10 Sit to stand to sit 18 inch chair holding 10 lb kettle bell 2 x 10 with slow lowering focus.    TREATMENT       DATE: 06/05/2024 Therex: Recumbent bike seat 5 lvl 5 10 mins.  (:15 second faster interval top of each minute from 2 mins to 7 mins) Incline gastroc stretch 30 sec x 5 bilateral  Knee extension machine double leg up, single leg lowering Lt 3 x 15 15 lbs (in available painfree range) Knee flexion machine Lt leg 3 x 10 15 lbs  Review of mobility intervention in HEP including seated LAQ, supine 90 deg hip flexion LAQ Lt leg x 10 each Supine bridge 2 x 10 2-3 sec hold   Neuro Re-ed (balance improvements, coordination ) SLS with corner touching contralateral leg x 8 each, performed bilaterally with SBA and occasional HHA on bar.    TherActivity (to improve stairs, squatting, transfers Step on over and down WB on Lt leg with light hand assist on bar 4 inch step x 10 Lateral step down 4 inch step x 10 WB on Lt leg   TREATMENT       DATE: 05/31/2024 Therex: Recumbent bike seat 5 lvl 5 8 mins.  (:15 second faster interval top of each minute from  2 mins to 7 mins) Incline gastroc stretch 30 sec x 5 bilateral  Knee extension machine double leg up, single leg lowering Lt 3 x 15 15 lbs (in available  painfree range) Knee flexion machine Lt leg 3 x 10 15 lbs   Discussed gym membership options with suggestion to check with insurance about financial coverage.    TherActivity (to improve stairs, squatting, transfers Leg press double leg 137 lbs slow lowering x 15 Leg press single leg Lt slow lowering 2 x 15 68 lbs  Flight of stairs down with reciprocal gait pattern with hand rail assist.  Focused on controlled lowering.   PATIENT EDUCATION:  06/07/2024 :  Education details: HEP update Person educated: Patient Education method: Explanation, Demonstration, Actor cues, Verbal cues, and Handouts Education comprehension: verbalized understanding, returned demonstration, verbal cues required, and tactile cues required  HOME EXERCISE PROGRAM: Access Code: [email or web address] URL: [email or web address] Date: 06/07/2024 Prepared by: Ozell Silvan  Exercises - Seated Long Arc Quad  - 2-3 x daily - 7 x weekly - 1-2 sets - 10 reps - 2 hold - Tandem Stance  - 1 x daily - 7 x weekly - 1 sets - 10 reps - 20 second hold - Single Leg Stance  - 1 x daily - 7 x weekly - 1 sets - 10 reps - 10 seconds hold - Prone Quadricep Stretch with Strap  - 2 x daily - 7 x weekly - 1 sets - 5-10 reps - 20 seconds hold - Seated Quad Set (Mirrored)  - 3-5 x daily - 7 x weekly - 1 sets - 10 reps - 5 hold - Seated SLR (Mirrored)  - 1-2 x daily - 7 x weekly - 1-2 sets - 10-15 reps - 2 hold - Seated Knee Extension Stretch with Chair (Mirrored)  - 2-3 x daily - 7 x weekly - 1 sets - 1 reps - 3-5 mins hold - Sit to Stand  - 3 x daily - 7 x weekly - 1 sets - 10 reps  ASSESSMENT:  CLINICAL IMPRESSION: The patient has attended 20 visits over the course of treatment cycle.  Patient has reported overall improvement at 50%.  See objective data above for updated information regarding current presentation.  Continued discussion about gym inclusion in HEP going forward to replace skilled PT treatments upon discharge when  appropriate.  Will return to MD after today's visit.  Pt has demonstrated good mobility gains, strength gains to this point.  Anticipate continued discussion on transitioning to HEP/gym pending MD visit reportings.     OBJECTIVE IMPAIRMENTS: Abnormal gait, decreased activity tolerance, decreased endurance, decreased knowledge of condition, difficulty walking, decreased ROM, decreased strength, increased edema, impaired perceived functional ability, and pain.   ACTIVITY LIMITATIONS: bending, sitting, standing, squatting, sleeping, stairs, transfers, bed mobility, dressing, and locomotion level  PARTICIPATION LIMITATIONS: driving, community activity, and yard work  PERSONAL FACTORS: L3-4 lumbar DDD; pre-diabetes; previous back surgery; cardiac history are also affecting patient's functional outcome.    REHAB POTENTIAL: Good  CLINICAL DECISION MAKING: Stable/uncomplicated  EVALUATION COMPLEXITY: Low   GOALS: Goals reviewed with patient? Yes  SHORT TERM GOALS: Target date: 05/16/2024 Ramonte will be independent with his day 1 home exercise program Baseline: Started 04/04/2024 Goal status: Met 04/25/2024  2.  Improve left knee active range of motion to 3 - 0 -100 degrees Baseline: 5 - 0 - 62 degrees Goal status: Met 04/25/2024  3.  Improve left quadriceps strength as assessed by MMT and functional testing Baseline:  2+/5 at evaluation Goal status: Ongoing   05/17/2024  4.  Tim will be independent with a single-point cane for ambulation outside the home Baseline: Wheeled walker full-time Goal status: Met 04/25/2024   LONG TERM GOALS: Target date: 06/27/2024  Improve patient specific functional score to at least 5  Goal status: Met 04/26/2024  2.  Masato will report left knee pain consistently 0-3/10 on the numeric pain rating scale  Goal status: Ongoing   06/07/2024  3.  Improve left knee AROM to 2 - 0 - 110 degrees or better  Goal status: Met 06/07/2024  4.  Improve left quadriceps  strength as assessed by MMT and Tyjae's ability to ambulate without an assistive device 100% of the time  Goal status: Met 06/07/2024  5.  Ezreal will be independent with his long-term maintenance home exercise program at discharge  Goal status: Ongoing   101/01/2024    PLAN:  PT FREQUENCY: 2-3x/week  PT DURATION: 12 weeks  PLANNED INTERVENTIONS: 97750- Physical Performance Testing, 97110-Therapeutic exercises, 97530- Therapeutic activity, 97112- Neuromuscular re-education, 97535- Self Care, [id number]- Manual therapy, [phone]- Gait training, [phone]- Vasopneumatic device, Patient/Family education, Balance training, Stair training, Joint mobilization, and Cryotherapy  PLAN FOR NEXT SESSION: Check on MD report.   NEXT MD VISIT: 06/18/2024   Ozell Silvan, PT, DPT, OCS, ATC 06/07/24  11:43 AM

## 2024-06-07 NOTE — Telephone Encounter (Signed)
 Looks like Dr. Vernetta refilled this earlier today.  I think he should stick with 5 mg because we are trying to wean off of this medication completely relatively soon.

## 2024-06-07 NOTE — Telephone Encounter (Signed)
 Patient wants a refill on his Oxycodone  and wants to know if you can do 10 mg instead of 5. He wants it sent to [facility].

## 2024-06-07 NOTE — Discharge Instructions (Addendum)
 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  This typically does not require any type of intervention and is managed with pain control.  Continue Tylenol , do not exceed 4000 mg (4g) in a 24-hour time period.  Please follow-up with your primary care provider in regard to this.  Return to the emergency department if your symptoms worsen.

## 2024-06-07 NOTE — ED Provider Notes (Signed)
 [place] [facility] Provider Note   CSN: [id number] Arrival date & time: 06/07/24  1723     Patient presents with: Flank Pain   Richard Walker is a 68 y.o. male.   68 year old male presenting with left flank/left abdominal pain.  Patient noted dull left flank pain yesterday which he initially attributed to a pulled muscle, however pain has since spread to his left lower abdomen, pain is exacerbated by movement, describes the pain as sharp when it does occur.  Denies dysuria, hematuria, nausea/vomiting/diarrhea, chest pain, shortness of breath.  Patient underwent knee replacement surgery last month as well as heart catheterization with stent placement.  On oxycodone  for knee pain following surgery. On Xarelto .    Flank Pain       Prior to Admission medications   Medication Sig Start Date End Date Taking? Authorizing Provider  albuterol  (PROAIR  HFA) 108 (90 Base) MCG/ACT inhaler Inhale 1-2 puffs into the lungs every 6 (six) hours as needed for wheezing or shortness of breath. 10/11/23   Kassie Acquanetta Bradley, MD  aspirin  EC 81 MG tablet Take 1 tablet (81 mg total) by mouth daily. Swallow whole. 05/01/24   Ladona Heinz, MD  azelastine  (ASTELIN ) 0.1 % nasal spray 1-2 sprays per nostril 2 times daily as needed. 11/23/23   Almarie Waddell NOVAK, NP  Blood Pressure Monitoring (BLOOD PRESSURE CUFF) MISC MONITOR BLOOD PRESSURE ONCE DAILY GOAL OF 130/80 05/21/24   Wyn Jackee VEAR Mickey., NP  budesonide -glycopyrrolate -formoterol  (BREZTRI  AEROSPHERE) 160-9-4.8 MCG/ACT AERO inhaler Inhale 2 puffs into the lungs in the morning and at bedtime. 05/10/24   Kassie Acquanetta Bradley, MD  cholecalciferol  (VITAMIN D3) 25 MCG (1000 UNIT) tablet Take 4,000 Units by mouth daily.    [provider]  clopidogrel  (PLAVIX ) 75 MG tablet Take 1 tablet (75 mg total) by mouth daily. 05/21/24   Wyn Jackee VEAR Mickey., NP  docusate sodium  (COLACE) 100 MG capsule Take 1 capsule (100 mg total) by mouth 2  (two) times daily. 05/14/24   Magnant, Charles L, PA-C  ezetimibe  (ZETIA ) 10 MG tablet Take 1 tablet (10 mg total) by mouth daily. 05/21/24   Wyn Jackee VEAR Mickey., NP  fenofibrate  (TRICOR ) 145 MG tablet Take 1 tablet (145 mg total) by mouth daily. 05/21/24   Wyn Jackee VEAR Mickey., NP  fenofibrate  (TRICOR ) 145 MG tablet Take 1 tablet (145 mg total) by mouth daily. 09/14/23   Saguier, [place], PA-C  flecainide  (TAMBOCOR ) 150 MG tablet Take 1 tablet (150 mg total) by mouth 2 (two) times daily. 06/06/23     gabapentin  (NEURONTIN ) 100 MG capsule Take 1 capsule (100 mg total) by mouth 2 (two) times daily. 04/11/24   Addie Cordella Hamilton, MD  losartan  (COZAAR ) 25 MG tablet Take 1 tablet (25 mg total) by mouth daily. 05/21/24   Wyn Jackee VEAR Mickey., NP  losartan  (COZAAR ) 25 MG tablet Take 1 tablet (25 mg total) by mouth daily. 09/14/23   Saguier, [place], PA-C  Magnesium  250 MG TABS Take 250 mg by mouth once a week.    [provider]  metFORMIN  (GLUCOPHAGE ) 500 MG tablet TAKE 1 TABLET(500 MG) BY MOUTH TWICE DAILY WITH A MEAL 09/14/23   Saguier, [place], PA-C  methocarbamol  (ROBAXIN ) 500 MG tablet Take 1 tablet (500 mg total) by mouth every 8 (eight) hours as needed for muscle spasms. 05/14/24   Magnant, Carlin CROME, PA-C  metoprolol  tartrate (LOPRESSOR ) 25 MG tablet Take one tablet by mouth twice a day.  May take one additional tablet by mouth as needed for breakthrough palpitations. 05/21/24   Wyn Jackee VEAR Mickey., NP  nitroGLYCERIN  (NITROSTAT ) 0.4 MG SL tablet Place 1 tablet (0.4 mg total) under the tongue every 5 (five) minutes as needed for up to 25 days for chest pain. Do not use for 24 hours after use of Viagra  05/01/24 06/06/24  Ladona Heinz, MD  oxyCODONE  (ROXICODONE ) 5 MG immediate release tablet Take 1 tablet (5 mg total) by mouth every 8 (eight) hours as needed for severe pain (pain score 7-10). 06/07/24   Vernetta Lonni GRADE, MD  pantoprazole  (PROTONIX ) 40 MG tablet Take 1 tablet (40 mg total) by mouth daily.  10/21/23   Saguier, [place], PA-C  PRESCRIPTION MEDICATION Inhale into the lungs at bedtime. CPAP    [provider]  rivaroxaban  (XARELTO ) 20 MG TABS tablet Take 1 tablet (20 mg total) by mouth daily with supper. 05/21/24   Wyn Jackee VEAR Mickey., NP  rosuvastatin  (CRESTOR ) 10 MG tablet Take 1 tablet (10 mg total) by mouth daily. 05/21/24 05/21/25  Wyn Jackee VEAR Mickey., NP  sildenafil  (VIAGRA ) 100 MG tablet Take 1 tablet (100 mg total) by mouth daily as needed for erectile dysfunction. 11/21/23   Waddell Danelle ORN, MD  tirzepatide  (ZEPBOUND ) 7.5 MG/0.5ML Pen Inject 7.5 mg into the skin once a week. 06/06/24   Francyne Romano, MD  Vitamin D , Ergocalciferol , (DRISDOL ) 1.25 MG (50000 UNIT) CAPS capsule Take 1 capsule (50,000 Units total) by mouth every 7 (seven) days. 03/23/24   Saguier, [place], PA-C    Allergies: Dust mite extract, Statins, and Tree extract    Review of Systems  Genitourinary:  Positive for flank pain.    Updated Vital Signs  Vitals:   06/07/24 1732 06/07/24 1733 06/07/24 2207  BP:  (!) 146/85 (!) 160/100  Pulse:  74 72  Resp:  (!) 24 20  Temp:  97.6 F (36.4 C)   TempSrc:  Oral   SpO2:  99% 98%  Weight: 99.8 kg    Height: 5' 8 (1.727 m)       Physical Exam Vitals and nursing note reviewed.  HENT:     Head: Normocephalic.  Eyes:     Extraocular Movements: Extraocular movements intact.  Cardiovascular:     Rate and Rhythm: Normal rate and regular rhythm.  Pulmonary:     Effort: Pulmonary effort is normal.     Breath sounds: Normal breath sounds.  Abdominal:     Palpations: Abdomen is soft.     Tenderness: There is abdominal tenderness. There is guarding. There is no right CVA tenderness or left CVA tenderness.     Comments: Tenderness/guarding in LLQ  Musculoskeletal:     Comments: Moves all extremities spontaneously without difficulty  Skin:    General: Skin is warm and dry.  Neurological:     Mental Status: He is alert and oriented to person, place, and  time.     (all labs ordered are listed, but only abnormal results are displayed) Labs Reviewed  URINALYSIS, ROUTINE W REFLEX MICROSCOPIC - Abnormal; Notable for the following components:      Result Value   Hgb urine dipstick TRACE (*)    All other components within normal limits  BASIC METABOLIC PANEL WITH GFR  CBC  URINALYSIS, MICROSCOPIC (REFLEX)    EKG: None  Radiology: CT Renal Stone Study Result Date: 06/07/2024 EXAM: CT UROGRAM 06/07/2024 08:47:15 PM TECHNIQUE: CT of the abdomen and pelvis was performed without intravenous contrast as  per CT urogram protocol. Multiplanar reformatted images as well as MIP urogram images are provided for review. Automated exposure control, iterative reconstruction, and/or weight based adjustment of the mA/kV was utilized to reduce the radiation dose to as low as reasonably achievable. COMPARISON: CT abdomen and pelvis 1325. CLINICAL HISTORY: LLQ pain, L flank pain. FINDINGS: LOWER CHEST: No acute abnormality. LIVER: The liver is unremarkable. GALLBLADDER AND BILE DUCTS: Gallbladder is unremarkable. No biliary ductal dilatation. SPLEEN: No acute abnormality. PANCREAS: No acute abnormality. ADRENAL GLANDS: No acute abnormality. KIDNEYS, URETERS AND BLADDER: Bilateral renal cysts are again seen. The largest is on the left measuring 5.7 cm. No stones in the kidneys or ureters. No hydronephrosis. No perinephric or periureteral stranding. Urinary bladder is unremarkable. GI AND BOWEL: Oval fat attenuation area with surrounding inflammation is seen adjacent to the distal descending colon. There is sigmoid colon diverticulosis. The appendix appears normal. Stomach demonstrates no acute abnormality. There is no bowel obstruction. PERITONEUM AND RETROPERITONEUM: No ascites. No free air. VASCULATURE: Aorta is normal in caliber. There are atherosclerotic calcifications of the aorta. LYMPH NODES: No lymphadenopathy. REPRODUCTIVE ORGANS: No acute abnormality. BONES AND  SOFT TISSUES: Disc spacer is seen at L4-L5. There are degenerative changes at L2-L3. There are small fat-containing bilateral inguinal hernias. No acute osseous abnormality. No focal soft tissue abnormality. IMPRESSION: 1. Distal descending colon  epiploic appendagitis. 2. Sigmoid colon diverticulosis without evidence of diverticulitis. Electronically signed by: Greig Pique MD 06/07/2024 09:04 PM EDT RP Workstation: [id number]     Procedures   Medications Ordered in the ED  oxyCODONE  (Oxy IR/ROXICODONE ) immediate release tablet 5 mg (5 mg Oral Given 06/07/24 2031)  acetaminophen  (TYLENOL ) tablet 650 mg (650 mg Oral Given 06/07/24 2031)                                    Medical Decision Making This patient presents to the ED for concern of left lower quadrant tenderness/guarding, this involves an extensive number of treatment options, and is a complaint that carries with it a high risk of complications and morbidity.  The differential diagnosis includes nephrolithiasis, ureterolithiasis, diverticulitis, constipation, gastroenteritis   Co morbidities that complicate the patient evaluation  Recent left knee replacement, recent heart catheterization with stent placement   Additional history obtained:  Additional history obtained from record review External records from outside source obtained and reviewed including prior hospital discharge summary   Lab Tests:  I Ordered, and personally interpreted labs.  The pertinent results include: CBC within normal limits, no leukocytosis.  BMP within normal limits.  Urinalysis notable for trace RBCs, no nitrites/leukocytes/bacteria.   Imaging Studies ordered:  I ordered imaging studies including CT renal stone study  I independently visualized and interpreted imaging which showed 1. Distal descending colon  epiploic appendagitis. 2. Sigmoid colon diverticulosis without evidence of diverticulitis.  I agree with the radiologist  interpretation   Cardiac Monitoring: / EKG:  The patient was maintained on a cardiac monitor.  I personally viewed and interpreted the cardiac monitored which showed an underlying rhythm of: NSR   Problem List / ED Course / Critical interventions / Medication management  I ordered medication including oxycodone  and Tylenol  for knee pain Reevaluation of the patient after these medicines showed that the patient improved I have reviewed the patients home medicines and have made adjustments as needed   Social Determinants of Health:  Former tobacco use   Test /  Admission - Considered:  Physical exam notable as above.  I have a high suspicion for nephrolithiasis versus ureterolithiasis, given the fact that the patient's pain originated in his left posterior flank in the region of the kidney and has now migrated to his left lower quadrant, no CVA tenderness on exam.  CT imaging is notable as above, I suspect his symptoms today are secondary to epiploic appendagitis, I discussed this finding in depth with the patient and advised that typically no intervention is necessary and this is managed with pain control.  Patient is on oxycodone  for chronic knee pain following his surgery, I recommend that he increase his Tylenol  use but advised that he should not exceed 4000 mg or 4 g of Tylenol  in a 24-hour period.  Patient is scheduled to follow-up with his PCP next week, I recommend that he discuss epiploic appendagitis diagnosis with his PCP at that time, he voiced understanding and is in agreement this plan.  Return precautions discussed.  He is appropriate for discharge at this time.  Staffed with Dr. Zackowski  Amount and/or Complexity of Data Reviewed Labs: ordered. Radiology: ordered.  Risk OTC drugs. Prescription drug management.        Final diagnoses:  Left lower quadrant pain    ED Discharge Orders     None          Glendia Rocky SAILOR, [place] 06/07/24 2359    Geraldene Glendia, MD 06/10/24 1222

## 2024-06-07 NOTE — ED Triage Notes (Signed)
 Pt c/o L lower side pain since yesterday, waxes and wanes. Pain is worse with movement.   Also c/o full body chills. Denies urinary sx.   Took oxycodone  appx 45 min pta.

## 2024-06-08 ENCOUNTER — Other Ambulatory Visit (HOSPITAL_BASED_OUTPATIENT_CLINIC_OR_DEPARTMENT_OTHER): Payer: Self-pay

## 2024-06-12 ENCOUNTER — Ambulatory Visit (HOSPITAL_BASED_OUTPATIENT_CLINIC_OR_DEPARTMENT_OTHER)
Admission: RE | Admit: 2024-06-12 | Discharge: 2024-06-12 | Disposition: A | Discharge status: Home / Self Care | Payer: Medicare (Managed Care) | Source: Ambulatory Visit | Attending: Medical | Admitting: Medical

## 2024-06-12 ENCOUNTER — Ambulatory Visit (INDEPENDENT_AMBULATORY_CARE_PROVIDER_SITE_OTHER): Payer: Medicare (Managed Care) | Admitting: Medical

## 2024-06-12 ENCOUNTER — Ambulatory Visit: Payer: Self-pay | Admitting: Medical

## 2024-06-12 ENCOUNTER — Other Ambulatory Visit (HOSPITAL_BASED_OUTPATIENT_CLINIC_OR_DEPARTMENT_OTHER): Payer: Self-pay

## 2024-06-12 VITALS — BP 124/86 | HR 65 | Temp 97.6°F | Resp 16 | Ht 68.0 in | Wt 225.4 lb

## 2024-06-12 DIAGNOSIS — E785 Hyperlipidemia, unspecified: Secondary | ICD-10-CM | POA: Diagnosis not present

## 2024-06-12 DIAGNOSIS — R319 Hematuria, unspecified: Secondary | ICD-10-CM | POA: Diagnosis not present

## 2024-06-12 DIAGNOSIS — Z23 Encounter for immunization: Secondary | ICD-10-CM | POA: Diagnosis not present

## 2024-06-12 DIAGNOSIS — R1012 Left upper quadrant pain: Secondary | ICD-10-CM

## 2024-06-12 DIAGNOSIS — I10 Essential (primary) hypertension: Secondary | ICD-10-CM | POA: Diagnosis not present

## 2024-06-12 DIAGNOSIS — M25562 Pain in left knee: Secondary | ICD-10-CM

## 2024-06-12 DIAGNOSIS — G8929 Other chronic pain: Secondary | ICD-10-CM

## 2024-06-12 DIAGNOSIS — I2584 Coronary atherosclerosis due to calcified coronary lesion: Secondary | ICD-10-CM | POA: Diagnosis not present

## 2024-06-12 DIAGNOSIS — R739 Hyperglycemia, unspecified: Secondary | ICD-10-CM

## 2024-06-12 DIAGNOSIS — R0609 Other forms of dyspnea: Secondary | ICD-10-CM | POA: Diagnosis not present

## 2024-06-12 DIAGNOSIS — E669 Obesity, unspecified: Secondary | ICD-10-CM

## 2024-06-12 DIAGNOSIS — I2510 Atherosclerotic heart disease of native coronary artery without angina pectoris: Secondary | ICD-10-CM | POA: Diagnosis not present

## 2024-06-12 DIAGNOSIS — Z96652 Presence of left artificial knee joint: Secondary | ICD-10-CM

## 2024-06-12 LAB — COMPREHENSIVE METABOLIC PANEL WITH GFR
ALT: 11 U/L (ref 0–53)
AST: 13 U/L (ref 0–37)
Albumin: 4.6 g/dL (ref 3.5–5.2)
Alkaline Phosphatase: 56 U/L (ref 39–117)
BUN: 14 mg/dL (ref 6–23)
CO2: 29 meq/L (ref 19–32)
Calcium: 10 mg/dL (ref 8.4–10.5)
Chloride: 101 meq/L (ref 96–112)
Creatinine, Ser: 1.15 mg/dL (ref 0.40–1.50)
GFR: 65.63 mL/min (ref >60.00)
Glucose, Bld: 97 mg/dL (ref 70–99)
Potassium: 4.7 meq/L (ref 3.5–5.1)
Sodium: 138 meq/L (ref 135–145)
Total Bilirubin: 0.7 mg/dL (ref 0.2–1.2)
Total Protein: 7.1 g/dL (ref 6.0–8.3)

## 2024-06-12 LAB — POC URINALSYSI DIPSTICK (AUTOMATED)
Glucose, UA: NEGATIVE
Leukocytes, UA: NEGATIVE
Nitrite, UA: NEGATIVE
Protein, UA: NEGATIVE
Spec Grav, UA: <=1.005 — AB (ref 1.010–1.025)
Urobilinogen, UA: 0.2 U/dL
pH, UA: 7 (ref 5.0–8.0)

## 2024-06-12 LAB — HEMOGLOBIN A1C: Hgb A1c MFr Bld: 5.4 % (ref 4.6–6.5)

## 2024-06-12 LAB — LIPID PANEL
Cholesterol: 120 mg/dL (ref 0–200)
HDL: 39.5 mg/dL (ref >39.00)
LDL Cholesterol: 61 mg/dL (ref 0–99)
NonHDL: 80.13
Total CHOL/HDL Ratio: 3
Triglycerides: 98 mg/dL (ref 0.0–149.0)
VLDL: 19.6 mg/dL (ref 0.0–40.0)

## 2024-06-12 LAB — BRAIN NATRIURETIC PEPTIDE: Pro B Natriuretic peptide (BNP): 42 pg/mL (ref 0.0–100.0)

## 2024-06-12 MED ORDER — AZITHROMYCIN 250 MG PO TABS
ORAL_TABLET | ORAL | 0 refills | Status: AC
  Filled 2024-06-12: qty 6, 5d supply, fill #0

## 2024-06-12 MED ORDER — AMOXICILLIN-POT CLAVULANATE 875-125 MG PO TABS
1.0000 | ORAL_TABLET | Freq: Two times a day (BID) | ORAL | 0 refills | Status: DC
  Filled 2024-06-12: qty 20, 10d supply, fill #0

## 2024-06-12 NOTE — Progress Notes (Signed)
 Subjective:    Patient ID: Richard Walker, male    DOB: May 13, 1956, 68 y.o.   MRN: [id number]  HPI     Richard Walker is a 68 year old male with coronary artery disease who presents with ongoing fatigue and left knee pain.  He experiences ongoing fatigue and decreased energy levels, which he attributes to recent medical procedures and medication changes. He underwent a left knee replacement on August 5th and a cardiac catheterization on September 9th due to coronary artery blockages. During the catheterization, two stents were placed, and one artery was opened. Despite these interventions, he feels  no improvement in energy levels. He was previously on flecainide , which was stopped, and he believes this may have contributed to his decreased energy levels. He is currently on Plavix  75 mg daily, Xarelto  20 mg, and Crestor  10 mg, along with losartan  and metoprolol  for blood pressure management.  He describes persistent left knee pain following the knee replacement surgery. Initially, he was expected to stay in the hospital for one night but ended up staying for five days. He is currently undergoing physical therapy and reports gradual improvement in pain and mobility, although he feels behind schedule in his recovery.  He has experienced left flank and abdominal pain, which was evaluated in the emergency department on October 16th. A CT scan showed sigmoid colon diverticulosis, and the doctor informed him that the findings were consistent with epiploic appendagitis. The pain resolved within a day or two without intervention.  He has a history of obesity and sleep apnea, for which he is on Zepbound . He has lost 45 pounds since March, reducing his weight from 265 to 222 pounds. Despite the weight loss, he experiences shortness of breath with prolonged walking. He has a history of prediabetes, with his blood sugar levels previously in the prediabetic range.         Intervention data: Successful  PTCA and stenting of the proximal and mid LAD with implantation of 3.0 x 28 and a 2.75 x 20 mm Synergy XD DES, proximal stent optimization with 3.5 x 10 mm emerge Oliver balloon at 14 atmospheric pressure overall stenosis reduced from 99-100% mid LAD occlusion to 0% with TIMI 0 to TIMI-3 flow at the end of the procedure.  Balloon angioplasty only with a 2.5 x 15 mm emerge to the distal LAD 90% stenosis reduced to 0%.     Review of Systems  Constitutional:  Positive for fatigue. Negative for chills and fever.  HENT:  Negative for congestion and ear pain.   Respiratory:  Negative for cough, chest tightness, shortness of breath and wheezing.        Daily mild short of breath on exertion  Cardiovascular:  Negative for chest pain and palpitations.  Gastrointestinal:  Negative for abdominal pain, blood in stool and diarrhea.  Genitourinary:  Negative for dysuria, frequency and penile pain.  Musculoskeletal:  Negative for back pain.       Knee pain  Skin:  Negative for rash.  Neurological:  Negative for dizziness and numbness.  Psychiatric/Behavioral:  Positive for dysphoric mood. Negative for behavioral problems and suicidal ideas. The patient is nervous/anxious.       Past Medical History:  Diagnosis Date   Arthritis    DDD (degenerative disc disease), lumbar    L3/L4   Diverticulosis    Dysrhythmia    SVT s/p ablation   Dysrhythmia    PAF   GERD (gastroesophageal reflux disease)  Hyperlipidemia    Low sperm motility    Myocardial infarction (HCC)    Pre-diabetes    Sleep apnea    uses cpap    SVT (supraventricular tachycardia)      Social History   Socioeconomic History   Marital status: Married    Spouse name: Not on file   Number of children: 4   Years of education: Not on file   Highest education level: Associate degree: occupational, Scientist, product/process development, or vocational program  Occupational History   Occupation: retired  Tobacco Use   Smoking status: Former    Current  packs/day: 0.00    Average packs/day: 1 pack/day for 10.0 years (10.0 ttl pk-yrs)    Types: Cigarettes    Start date: 67    Quit date: 1992    Years since quitting: 33.8   Smokeless tobacco: Never   Tobacco comments:    Uses Hookah Pipe occasionally  Vaping Use   Vaping status: Never Used  Substance and Sexual Activity   Alcohol use: No   Drug use: No   Sexual activity: Yes  Other Topics Concern   Not on file  Social History Narrative   Marital Status: Married (Feryal)Children:  4 (3 Sons/1 Daughter)Pets:  None Living Situation: Lives with spouse and 4 childrenOrigin:  He was born in [place].Occupation: Production designer, 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  Alcohol Use:  NoneDrug Use:  NoneDiet:  RegularExercise:  NoneHobbies:  Cards   Retired    Teacher, early years/pre Strain: High Risk (06/11/2024)   Overall Financial Resource Strain (CARDIA)    Difficulty of Paying Living Expenses: Hard  Food Insecurity: Food Insecurity Present (06/11/2024)   Hunger Vital Sign    Worried About Running Out of Food in the Last Year: Sometimes true    Ran Out of Food in the Last Year: Sometimes true  Transportation Needs: No Transportation Needs (06/11/2024)   PRAPARE - Administrator, Civil Service (Medical): No    Lack of Transportation (Non-Medical): No  Physical Activity: Insufficiently Active (06/11/2024)   Exercise Vital Sign    Days of Exercise per Week: 2 days    Minutes of Exercise per Session: 30 min  Stress: Stress Concern Present (06/11/2024)   [employer] of Occupational Health - Occupational Stress Questionnaire    Feeling of Stress: To some extent  Social Connections: Socially Integrated (06/11/2024)   Social Connection and Isolation Panel    Frequency of Communication with Friends and Family: More than three times a week    Frequency  of Social Gatherings with Friends and Family: Once a week    Attends Religious Services: 1 to 4 times per year    Active Member of [employer] or Organizations: Yes    Attends Banker Meetings: 1 to 4 times per year    Marital Status: Married  Catering manager Violence: Not At Risk (03/27/2024)   Humiliation, Afraid, Rape, and Kick questionnaire    Fear of Current or Ex-Partner: No    Emotionally Abused: No    Physically Abused: No    Sexually Abused: No    Past Surgical History:  Procedure Laterality Date   AMPUTATION Left 07/15/2018   Procedure: . REPAIR OF LEFT INDEX AND LONG FINGER BY FUSION /  REPAIR OF RING FINGER;  Surgeon: Sebastian Lenis, MD;  Location: [facility] OR;  Service: Orthopedics;  Laterality: Left;   BACK SURGERY  BLALOCK PROCEDURE     Ablation   CARDIAC CATHETERIZATION N/A 12/22/2015   Procedure: Left Heart Cath and Coronary Angiography;  Surgeon: Peter M Swaziland, MD;  Location: [facility] INVASIVE CV LAB;  Service: Cardiovascular;  Laterality: N/A;   CARDIAC ELECTROPHYSIOLOGY STUDY AND ABLATION     CORONARY STENT INTERVENTION N/A 05/01/2024   Procedure: CORONARY STENT INTERVENTION;  Surgeon: Ladona Heinz, MD;  Location: [facility] INVASIVE CV LAB;  Service: Cardiovascular;  Laterality: N/A;   ELECTROPHYSIOLOGIC STUDY N/A 02/19/2016   Procedure: SVT Ablation;  Surgeon: Danelle LELON Birmingham, MD;  Location: [facility] INVASIVE CV LAB;  Service: Cardiovascular;  Laterality: N/A;   HAND SURGERY     LEFT HEART CATH AND CORONARY ANGIOGRAPHY N/A 05/01/2024   Procedure: LEFT HEART CATH AND CORONARY ANGIOGRAPHY;  Surgeon: Ladona Heinz, MD;  Location: [facility] INVASIVE CV LAB;  Service: Cardiovascular;  Laterality: N/A;   septal reconstruction  2017   TOTAL KNEE ARTHROPLASTY Left 03/27/2024   Procedure: ARTHROPLASTY, KNEE, TOTAL, LEFT;  Surgeon: Addie Cordella Hamilton, MD;  Location: [facility] OR;  Service: Orthopedics;  Laterality: Left;   TURBINATE REDUCTION Bilateral 07/2016   WISDOM TOOTH EXTRACTION      Family History   Problem Relation Age of Onset   Stroke Mother    Heart disease Mother    High Cholesterol Mother    Sleep apnea Father    Hypertension Father    Food Allergy Son    Colon cancer Neg Hx    Stomach cancer Neg Hx    Allergic rhinitis Neg Hx    Angioedema Neg Hx    Asthma Neg Hx    Eczema Neg Hx    Immunodeficiency Neg Hx    Urticaria Neg Hx     Allergies  Allergen Reactions   Dust Mite Extract    Statins     Muscle aches   Tree Extract Other (See Comments)    Intolerance Grass    Current Outpatient Medications on File Prior to Visit  Medication Sig Dispense Refill   albuterol  (PROAIR  HFA) 108 (90 Base) MCG/ACT inhaler Inhale 1-2 puffs into the lungs every 6 (six) hours as needed for wheezing or shortness of breath. 24 g 3   aspirin  EC 81 MG tablet Take 1 tablet (81 mg total) by mouth daily. Swallow whole. 15 tablet 0   azelastine  (ASTELIN ) 0.1 % nasal spray 1-2 sprays per nostril 2 times daily as needed. 90 mL 3   Blood Pressure Monitoring (BLOOD PRESSURE CUFF) MISC MONITOR BLOOD PRESSURE ONCE DAILY GOAL OF 130/80 1 each 0   budesonide -glycopyrrolate -formoterol  (BREZTRI  AEROSPHERE) 160-9-4.8 MCG/ACT AERO inhaler Inhale 2 puffs into the lungs in the morning and at bedtime. 10.7 g 10   cholecalciferol  (VITAMIN D3) 25 MCG (1000 UNIT) tablet Take 4,000 Units by mouth daily.     clopidogrel  (PLAVIX ) 75 MG tablet Take 1 tablet (75 mg total) by mouth daily. 90 tablet 3   docusate sodium  (COLACE) 100 MG capsule Take 1 capsule (100 mg total) by mouth 2 (two) times daily. 30 capsule 0   ezetimibe  (ZETIA ) 10 MG tablet Take 1 tablet (10 mg total) by mouth daily. 90 tablet 3   fenofibrate  (TRICOR ) 145 MG tablet Take 1 tablet (145 mg total) by mouth daily. 90 tablet 0   gabapentin  (NEURONTIN ) 100 MG capsule Take 1 capsule (100 mg total) by mouth 2 (two) times daily. 42 capsule 0   losartan  (COZAAR ) 25 MG tablet Take 1 tablet (25 mg total) by mouth daily. 90 tablet  0   Magnesium  250 MG TABS  Take 250 mg by mouth once a week.     metFORMIN  (GLUCOPHAGE ) 500 MG tablet TAKE 1 TABLET(500 MG) BY MOUTH TWICE DAILY WITH A MEAL 180 tablet 3   methocarbamol  (ROBAXIN ) 500 MG tablet Take 1 tablet (500 mg total) by mouth every 8 (eight) hours as needed for muscle spasms. 30 tablet 0   metoprolol  tartrate (LOPRESSOR ) 25 MG tablet Take one tablet by mouth twice a day.  May take one additional tablet by mouth as needed for breakthrough palpitations. 270 tablet 3   nitroGLYCERIN  (NITROSTAT ) 0.4 MG SL tablet Place 1 tablet (0.4 mg total) under the tongue every 5 (five) minutes as needed for up to 25 days for chest pain. Do not use for 24 hours after use of Viagra  25 tablet 0   oxyCODONE  (ROXICODONE ) 5 MG immediate release tablet Take 1 tablet (5 mg total) by mouth every 8 (eight) hours as needed for severe pain (pain score 7-10). 30 tablet 0   pantoprazole  (PROTONIX ) 40 MG tablet Take 1 tablet (40 mg total) by mouth daily. 30 tablet 2   PRESCRIPTION MEDICATION Inhale into the lungs at bedtime. CPAP     rivaroxaban  (XARELTO ) 20 MG TABS tablet Take 1 tablet (20 mg total) by mouth daily with supper. 30 tablet 3   rosuvastatin  (CRESTOR ) 10 MG tablet Take 1 tablet (10 mg total) by mouth daily. 90 tablet 3   sildenafil  (VIAGRA ) 100 MG tablet Take 1 tablet (100 mg total) by mouth daily as needed for erectile dysfunction. 30 tablet 3   tirzepatide  (ZEPBOUND ) 7.5 MG/0.5ML Pen Inject 7.5 mg into the skin once a week. 2 mL 0   Vitamin D , Ergocalciferol , (DRISDOL ) 1.25 MG (50000 UNIT) CAPS capsule Take 1 capsule (50,000 Units total) by mouth every 7 (seven) days. 12 capsule 3   No current facility-administered medications on file prior to visit.    BP 124/86   Pulse 65   Temp 97.6 F (36.4 C) (Oral)   Resp 16   Ht 5' 8 (1.727 m)   Wt 225 lb 6.4 oz (102.2 kg)   SpO2 97%   BMI 34.27 kg/m        Objective:   Physical Exam  General Mental Status- Alert. General Appearance- Not in acute distress.    Skin General: Color- Normal Color. Moisture- Normal Moisture.  Neck Carotid Arteries- Normal color. Moisture- Normal Moisture. No carotid bruits. No JVD.  Chest and Lung Exam Auscultation: Breath Sounds:-Normal.  Cardiovascular Auscultation:Rythm- RRR Murmurs & Other Heart Sounds:Auscultation of the heart reveals- No Murmurs.  Abdomen Inspection:-Inspeection CTA Palpation/Percussion:Note:No mass. Palpation and Percussion of the abdomen reveal- Non Tender, Non Distended + BS, no rebound or guarding.    Neurologic Cranial Nerve exam:- CN III-XII intact(No nystagmus), symmetric smile. Drift Test:- No drift. Romberg Exam:- Negative.  Heal to Toe Gait exam:-Normal. Finger to Nose:- Normal/Intact Strength:- 5/5 equal and symmetric strength both upper and lower extremities.   Lower ext- calfs symmetic, no pedal edema, negative homans signs.      Assessment & Plan:   Patient Instructions  Coronary artery disease with history of stents Post-stent placement, energy levels unchanged. Cardiologist stopped flecainide , possibly affecting energy. On Plavix , Xarelto , Crestor . Cardiac rehab scheduled. - Continue Plavix  75 mg daily. - Continue Xarelto  20 mg daily. - Continue Crestor  10 mg daily. - Attend cardiac rehab starting in November. - Discuss potential echocardiogram with cardiologist to assess ejection fraction.  Hypertension Blood pressure well controlled  on losartan  and metoprolol . - Continue losartan . - Continue metoprolol  25 mg twice daily.  Obesity and weight loss management Lost 45 pounds since March with Zepbound  and lifestyle changes. Plans to join fitness club for further weight loss and knee rehabilitation. - Continue Zepbound . - Join a fitness club for weight management and knee rehabilitation.  Sleep apnea Managed in conjunction with weight loss efforts. -continue cpap.  Left knee pain after knee replacement Post-operative pain improving with physical  therapy. Second knee replacement planned for December. - Continue physical therapy. - Plan for second knee replacement in December.  Type 2 diabetes mellitus, diet controlled Previously prediabetic, now managed with diet and weight loss. A1c not recently checked. - Order A1c to assess current glycemic control.  Epiploic appendagitis, resolved Resolved spontaneously. - Advise to avoid constipation and maintain hydration. -Epiploic appendagitis is a rare, benign, and self-limiting inflammatory condition caused by torsion or thrombosis of an epiploic appendage--small fat-filled pouches along the colon. It's often misdiagnosed as appendicitis or diverticulitis due to similar symptoms, but it typically requires conservative management rather than surgery or antibiotics  Diverticulosis of colon Noted on recent CT scan. No current diverticulitis. - if pain reoccurs may need to repeat ct scan.  Hematuria, intermittent Intermittent hematuria noted. Recent CT scan negative for kidney stones. Blood in urine noted during recent ED visit. - Order urinalysis to check for persistent hematuria.  Depression and anxiety symptoms Symptoms likely related to recent surgeries and health concerns. - Discuss potential need for medication if symptoms persist.  Hyperlipidemia -get cmp and lipid panel. -continue current cholesterol med.  Elevated sugar -repeat a1c and recommend low sugar diet. -Getting some benefit from zepbound .  flu vaccine today.  Follow up date to be determined after lab review                    Dallas Maxwell, PA-C   I personally spent a total of 43 minutes in the care of the patient today including getting/reviewing separately obtained history, performing a medically appropriate exam/evaluation, counseling and educating, placing orders, and documenting clinical information in the EHR.

## 2024-06-12 NOTE — Patient Instructions (Addendum)
 Coronary artery disease with history of stents Post-stent placement, energy levels unchanged. Cardiologist stopped flecainide , possibly affecting energy. On Plavix , Xarelto , Crestor . Cardiac rehab scheduled. - Continue Plavix  75 mg daily. - Continue Xarelto  20 mg daily. - Continue Crestor  10 mg daily. - Attend cardiac rehab starting in November. - Discuss potential echocardiogram with cardiologist to assess ejection fraction.  Hypertension Blood pressure well controlled on losartan  and metoprolol . - Continue losartan . - Continue metoprolol  25 mg twice daily.  Obesity and weight loss management Lost 45 pounds since March with Zepbound  and lifestyle changes. Plans to join fitness club for further weight loss and knee rehabilitation. - Continue Zepbound . - Join a fitness club for weight management and knee rehabilitation.  Sleep apnea Managed in conjunction with weight loss efforts. -continue cpap.  Left knee pain after knee replacement Post-operative pain improving with physical therapy. Second knee replacement planned for December. - Continue physical therapy. - Plan for second knee replacement in December.  Type 2 diabetes mellitus, diet controlled Previously prediabetic, now managed with diet and weight loss. A1c not recently checked. - Order A1c to assess current glycemic control.  Epiploic appendagitis, resolved Resolved spontaneously. - Advise to avoid constipation and maintain hydration. -Epiploic appendagitis is a rare, benign, and self-limiting inflammatory condition caused by torsion or thrombosis of an epiploic appendage--small fat-filled pouches along the colon. It's often misdiagnosed as appendicitis or diverticulitis due to similar symptoms, but it typically requires conservative management rather than surgery or antibiotics  Diverticulosis of colon Noted on recent CT scan. No current diverticulitis. - if pain reoccurs may need to repeat ct scan.  Hematuria,  intermittent Intermittent hematuria noted. Recent CT scan negative for kidney stones. Blood in urine noted during recent ED visit. - Order urinalysis to check for persistent hematuria.  Depression and anxiety symptoms Symptoms likely related to recent surgeries and health concerns. - Discuss potential need for medication if symptoms persist.  Hyperlipidemia -get cmp and lipid panel. -continue current cholesterol med.  Elevated sugar -repeat a1c and recommend low sugar diet. -Getting some benefit from zepbound .  Dyspnea on exertion -bnp and cxr -cxr showed broncopneumonia type findings. Rx augmenting ans zpack.  flu vaccine today.  Follow up date to be determined after lab review

## 2024-06-13 ENCOUNTER — Telehealth: Payer: Self-pay

## 2024-06-13 ENCOUNTER — Other Ambulatory Visit (HOSPITAL_BASED_OUTPATIENT_CLINIC_OR_DEPARTMENT_OTHER): Payer: Self-pay

## 2024-06-13 LAB — UNLABELED: Test Ordered On Req: 395

## 2024-06-13 NOTE — Progress Notes (Signed)
 Seen by patient Richard Walker on 06/12/2024  8:34 PM

## 2024-06-13 NOTE — Telephone Encounter (Signed)
 Copied from CRM [id number]. Topic: General - Other >> Jun 13, 2024  9:18 AM Robinson H wrote: Reason for CRM: Paulette from [facility] was reaching out with questions regarding an order and sample they received for patient, while on hold with [facility] Paulette disconnected call, originally stated she could only hold 2 minutes. No call back information

## 2024-06-14 LAB — PAT ID TIQ DOC: Test Affected: 395

## 2024-06-14 LAB — URINE CULTURE
MICRO NUMBER:: 17132778
Result:: NO GROWTH
SPECIMEN QUALITY:: ADEQUATE

## 2024-06-15 NOTE — Addendum Note (Signed)
 Addended by: DORINA DALLAS HERO on: 06/15/2024 12:52 PM   Modules accepted: Orders

## 2024-06-18 ENCOUNTER — Ambulatory Visit: Payer: Medicare (Managed Care) | Admitting: Orthopedic Surgery

## 2024-06-18 ENCOUNTER — Other Ambulatory Visit (HOSPITAL_BASED_OUTPATIENT_CLINIC_OR_DEPARTMENT_OTHER): Payer: Self-pay

## 2024-06-18 ENCOUNTER — Other Ambulatory Visit: Payer: Self-pay | Admitting: Orthopaedic Surgery

## 2024-06-18 DIAGNOSIS — M170 Bilateral primary osteoarthritis of knee: Secondary | ICD-10-CM

## 2024-06-18 MED ORDER — OXYCODONE HCL 5 MG PO TABS
5.0000 mg | ORAL_TABLET | Freq: Three times a day (TID) | ORAL | 0 refills | Status: DC | PRN
  Filled 2024-06-18: qty 30, 10d supply, fill #0

## 2024-06-19 NOTE — Progress Notes (Unsigned)
 Post-Op Visit Note   Patient: Richard Walker           Date of Birth: [birth year]           MRN: [id number] Visit Date: 06/18/2024 PCP: Dorina Loving, PA-C   Assessment & Plan:  Chief Complaint:  Chief Complaint  Patient presents with   Left Knee - Routine Post Op     03/27/2024 Left TKA     Visit Diagnoses:  1. Primary osteoarthritis of both knees     Plan: Demontay is now about 2-1/2 months out left total knee replacement.  Overall he is doing well.  Is going to physical therapy.  Has 1 more to 2 more visits left.  Having some mild left thigh pain but that is improving.  He did use the CPM machine.  On examination range of motion is 0-1 35 which is excellent.  No effusion.  He would like to have the right knee replaced sometime in early January.  Continue with weaning of the pain medication so that it will be effective when he has his other knee replaced.  Risk benefits are again discussed.  I think in general since he has an idea about the rehab from his left knee that he would do well with the right knee.  All questions answered.  Follow-Up Instructions: No follow-ups on file.   Orders:  No orders of the defined types were placed in this encounter.  No orders of the defined types were placed in this encounter.   Imaging: No results found.  PMFS History: Patient Active Problem List   Diagnosis Date Noted   Use of opiates for therapeutic purposes 05/09/2024   CAD (coronary artery disease) 05/01/2024   Arthritis of left knee 04/15/2024   S/P TKR (total knee replacement), left 03/29/2024   S/P total knee arthroplasty, left 03/27/2024   Constipation 02/14/2024   Metabolic dysfunction-associated steatotic liver disease (MASLD) 01/13/2024   Abnormal food appetite 11/17/2023   Insulin  resistance 11/17/2023   Class 2 severe obesity due to excess calories with serious comorbidity and body mass index (BMI) of 35.0 to 35.9 in adult 11/03/2023   Prediabetes 11/03/2023    Degenerative disc disease, lumbar 12/09/2022   Morbid obesity (HCC) 10/26/2022   Bronchitis 01/29/2022   OSA on CPAP 02/19/2021   Bite, snake 01/28/2021   Neck pain 11/13/2020   Vertigo 11/13/2020   Hypertension 11/03/2020   Contusion of bone 10/23/2020   Degenerative arthritis of knee, bilateral 10/23/2020   Concussion with no loss of consciousness 10/23/2020   Paroxysmal atrial fibrillation (HCC) 05/22/2019   Unspecified atrial flutter (HCC) 05/22/2019   Pain in right foot 03/30/2018   Perennial and seasonal allergic rhinitis 11/09/2017   History of food allergy 11/09/2017   Bronchitis, mucopurulent recurrent (HCC) 09/07/2017   SVT (supraventricular tachycardia) 04/23/2015   Abdominal pain, epigastric 06/03/2013   Vitamin D  deficiency 04/09/2013   Esophageal reflux 04/09/2013   Hepatitis B carrier (HCC) 04/09/2013   Other and unspecified hyperlipidemia 04/09/2013   Sleep apnea 04/09/2013   Past Medical History:  Diagnosis Date   Arthritis    DDD (degenerative disc disease), lumbar    L3/L4   Diverticulosis    Dysrhythmia    SVT s/p ablation   Dysrhythmia    PAF   GERD (gastroesophageal reflux disease)    Hyperlipidemia    Low sperm motility    Myocardial infarction (HCC)    Pre-diabetes    Sleep apnea    uses  cpap    SVT (supraventricular tachycardia)     Family History  Problem Relation Age of Onset   Stroke Mother    Heart disease Mother    High Cholesterol Mother    Sleep apnea Father    Hypertension Father    Food Allergy Son    Colon cancer Neg Hx    Stomach cancer Neg Hx    Allergic rhinitis Neg Hx    Angioedema Neg Hx    Asthma Neg Hx    Eczema Neg Hx    Immunodeficiency Neg Hx    Urticaria Neg Hx     Past Surgical History:  Procedure Laterality Date   AMPUTATION Left 07/15/2018   Procedure: . REPAIR OF LEFT INDEX AND LONG FINGER BY FUSION /  REPAIR OF RING FINGER;  Surgeon: Sebastian Lenis, MD;  Location: [facility] OR;  Service: Orthopedics;   Laterality: Left;   BACK SURGERY     BLALOCK PROCEDURE     Ablation   CARDIAC CATHETERIZATION N/A 12/22/2015   Procedure: Left Heart Cath and Coronary Angiography;  Surgeon: Peter M Jordan, MD;  Location: [facility] INVASIVE CV LAB;  Service: Cardiovascular;  Laterality: N/A;   CARDIAC ELECTROPHYSIOLOGY STUDY AND ABLATION     CORONARY STENT INTERVENTION N/A 05/01/2024   Procedure: CORONARY STENT INTERVENTION;  Surgeon: Ladona Heinz, MD;  Location: [facility] INVASIVE CV LAB;  Service: Cardiovascular;  Laterality: N/A;   ELECTROPHYSIOLOGIC STUDY N/A 02/19/2016   Procedure: SVT Ablation;  Surgeon: Danelle LELON Birmingham, MD;  Location: [facility] INVASIVE CV LAB;  Service: Cardiovascular;  Laterality: N/A;   HAND SURGERY     LEFT HEART CATH AND CORONARY ANGIOGRAPHY N/A 05/01/2024   Procedure: LEFT HEART CATH AND CORONARY ANGIOGRAPHY;  Surgeon: Ladona Heinz, MD;  Location: [facility] INVASIVE CV LAB;  Service: Cardiovascular;  Laterality: N/A;   septal reconstruction  2017   TOTAL KNEE ARTHROPLASTY Left 03/27/2024   Procedure: ARTHROPLASTY, KNEE, TOTAL, LEFT;  Surgeon: Addie Cordella Hamilton, MD;  Location: [facility] OR;  Service: Orthopedics;  Laterality: Left;   TURBINATE REDUCTION Bilateral 07/2016   WISDOM TOOTH EXTRACTION     Social History   Occupational History   Occupation: retired  Tobacco Use   Smoking status: Former    Current packs/day: 0.00    Average packs/day: 1 pack/day for 10.0 years (10.0 ttl pk-yrs)    Types: Cigarettes    Start date: 40    Quit date: 1992    Years since quitting: 33.8   Smokeless tobacco: Never   Tobacco comments:    Uses Hookah Pipe occasionally  Vaping Use   Vaping status: Never Used  Substance and Sexual Activity   Alcohol use: No   Drug use: No   Sexual activity: Yes

## 2024-06-21 ENCOUNTER — Encounter: Payer: Self-pay | Admitting: Rehabilitative and Restorative Service Providers"

## 2024-06-21 ENCOUNTER — Encounter: Payer: Self-pay | Admitting: Orthopedic Surgery

## 2024-06-21 ENCOUNTER — Ambulatory Visit (INDEPENDENT_AMBULATORY_CARE_PROVIDER_SITE_OTHER): Payer: Medicare (Managed Care) | Admitting: Rehabilitative and Restorative Service Providers"

## 2024-06-21 DIAGNOSIS — R600 Localized edema: Secondary | ICD-10-CM

## 2024-06-21 DIAGNOSIS — M6281 Muscle weakness (generalized): Secondary | ICD-10-CM | POA: Diagnosis not present

## 2024-06-21 DIAGNOSIS — M25662 Stiffness of left knee, not elsewhere classified: Secondary | ICD-10-CM

## 2024-06-21 DIAGNOSIS — M25562 Pain in left knee: Secondary | ICD-10-CM | POA: Diagnosis not present

## 2024-06-21 DIAGNOSIS — R262 Difficulty in walking, not elsewhere classified: Secondary | ICD-10-CM | POA: Diagnosis not present

## 2024-06-21 NOTE — Therapy (Signed)
 OUTPATIENT PHYSICAL THERAPY TREATMENT  / DISCHARGE   Patient Name: Richard Walker MRN: [id number] DOB:Mar 01, 1956, 68 y.o., male Today's Date: 06/21/2024  PHYSICAL THERAPY DISCHARGE SUMMARY  Visits from Start of Care: 21  Current functional level related to goals / functional outcomes: See note   Remaining deficits: See note   Education / Equipment: HEP  Patient goals were met. Patient is being discharged due to being pleased with the current functional level.    END OF SESSION:  PT End of Session - 06/21/24 1155     Visit Number 21    Number of Visits 24    Date for Recertification  06/27/24    Authorization Type [employer] 20% coinsurance    Authorization - Number of Visits 33    Progress Note Due on Visit 24    PT Start Time 1149    PT Stop Time 1227    PT Time Calculation (min) 38 min    Activity Tolerance Patient tolerated treatment well    Behavior During Therapy WFL for tasks assessed/performed            Past Medical History:  Diagnosis Date   Arthritis    DDD (degenerative disc disease), lumbar    L3/L4   Diverticulosis    Dysrhythmia    SVT s/p ablation   Dysrhythmia    PAF   GERD (gastroesophageal reflux disease)    Hyperlipidemia    Low sperm motility    Myocardial infarction (HCC)    Pre-diabetes    Sleep apnea    uses cpap    SVT (supraventricular tachycardia)    Past Surgical History:  Procedure Laterality Date   AMPUTATION Left 07/15/2018   Procedure: . REPAIR OF LEFT INDEX AND LONG FINGER BY FUSION /  REPAIR OF RING FINGER;  Surgeon: Sebastian Lenis, MD;  Location: [facility] OR;  Service: Orthopedics;  Laterality: Left;   BACK SURGERY     BLALOCK PROCEDURE     Ablation   CARDIAC CATHETERIZATION N/A 12/22/2015   Procedure: Left Heart Cath and Coronary Angiography;  Surgeon: Peter M Jordan, MD;  Location: [facility] INVASIVE CV LAB;  Service: Cardiovascular;  Laterality: N/A;   CARDIAC ELECTROPHYSIOLOGY STUDY AND ABLATION     CORONARY  STENT INTERVENTION N/A 05/01/2024   Procedure: CORONARY STENT INTERVENTION;  Surgeon: Ladona Heinz, MD;  Location: [facility] INVASIVE CV LAB;  Service: Cardiovascular;  Laterality: N/A;   ELECTROPHYSIOLOGIC STUDY N/A 02/19/2016   Procedure: SVT Ablation;  Surgeon: Danelle LELON Birmingham, MD;  Location: [facility] INVASIVE CV LAB;  Service: Cardiovascular;  Laterality: N/A;   HAND SURGERY     LEFT HEART CATH AND CORONARY ANGIOGRAPHY N/A 05/01/2024   Procedure: LEFT HEART CATH AND CORONARY ANGIOGRAPHY;  Surgeon: Ladona Heinz, MD;  Location: [facility] INVASIVE CV LAB;  Service: Cardiovascular;  Laterality: N/A;   septal reconstruction  2017   TOTAL KNEE ARTHROPLASTY Left 03/27/2024   Procedure: ARTHROPLASTY, KNEE, TOTAL, LEFT;  Surgeon: Addie Cordella Hamilton, MD;  Location: [facility] OR;  Service: Orthopedics;  Laterality: Left;   TURBINATE REDUCTION Bilateral 07/2016   WISDOM TOOTH EXTRACTION     Patient Active Problem List   Diagnosis Date Noted   Use of opiates for therapeutic purposes 05/09/2024   CAD (coronary artery disease) 05/01/2024   Arthritis of left knee 04/15/2024   S/P TKR (total knee replacement), left 03/29/2024   S/P total knee arthroplasty, left 03/27/2024   Constipation 02/14/2024   Metabolic dysfunction-associated steatotic liver disease (MASLD) 01/13/2024   Abnormal  food appetite 11/17/2023   Insulin  resistance 11/17/2023   Class 2 severe obesity due to excess calories with serious comorbidity and body mass index (BMI) of 35.0 to 35.9 in adult 11/03/2023   Prediabetes 11/03/2023   Degenerative disc disease, lumbar 12/09/2022   Morbid obesity (HCC) 10/26/2022   Bronchitis 01/29/2022   OSA on CPAP 02/19/2021   Bite, snake 01/28/2021   Neck pain 11/13/2020   Vertigo 11/13/2020   Hypertension 11/03/2020   Contusion of bone 10/23/2020   Degenerative arthritis of knee, bilateral 10/23/2020   Concussion with no loss of consciousness 10/23/2020   Paroxysmal atrial fibrillation (HCC) 05/22/2019   Unspecified atrial  flutter (HCC) 05/22/2019   Pain in right foot 03/30/2018   Perennial and seasonal allergic rhinitis 11/09/2017   History of food allergy 11/09/2017   Bronchitis, mucopurulent recurrent (HCC) 09/07/2017   SVT (supraventricular tachycardia) 04/23/2015   Abdominal pain, epigastric 06/03/2013   Vitamin D  deficiency 04/09/2013   Esophageal reflux 04/09/2013   Hepatitis B carrier (HCC) 04/09/2013   Other and unspecified hyperlipidemia 04/09/2013   Sleep apnea 04/09/2013    PCP: Dallas Maxwell, PA-C  REFERRING PROVIDER: Cordella Glendia Hutchinson, MD  REFERRING DIAG:  Diagnosis  R53.1 (ICD-10-CM) - Weakness    THERAPY DIAG:  Difficulty in walking, not elsewhere classified  Muscle weakness (generalized)  Localized edema  Acute pain of left knee  Knee stiffness, left  Rationale for Evaluation and Treatment: Rehabilitation  ONSET DATE: 03/27/2024  SUBJECTIVE:   SUBJECTIVE STATEMENT: Pt indicated overall progress at 50%.  Pt indicated functional has gotten better but symptoms are still noted in Lt leg.     PERTINENT HISTORY: L3-4 lumbar DDD; pre-diabetes; previous back surgery; cardiac history  PAIN:  NPRS scale: up to 5/10  Pain location: Lt Knee (anterior), thigh  Pain description: not as sharp as before but still noted.  Aggravating factors: Sleeping  Relieving factors: Pain medications and ice  PRECAUTIONS: Back  RED FLAGS: None   WEIGHT BEARING RESTRICTIONS: No  FALLS:  Has patient fallen in last 6 months? No  LIVING ENVIRONMENT: Lives with: lives with their family, lives with their son, and lives with their daughter Lives in: House/apartment Stairs: Avoids stairs, walker and help for stairs now Has following equipment at home: Vannie - 2 wheeled  OCCUPATION: Retired  PLOF: Independent  PATIENT GOALS: Yard work, be more active   OBJECTIVE:  Note: Objective measures were completed at Evaluation unless otherwise noted.  DIAGNOSTIC FINDINGS: See  chart  PATIENT SURVEYS:  PSFS: THE PATIENT SPECIFIC FUNCTIONAL SCALE  Place score of 0-10 (0 = unable to perform activity and 10 = able to perform activity at the same level as before injury or problem)  Activity Date: 04/04/2024 04/26/2024 06/05/2024 06/21/2024  Sleep 0 4 7 8   2.  Sitting 2 6 6 9   3.  Moving around (walking) 2 7 6 8   4.  Showering 1 10 10 10   Total Score 1.25  6.75 7.25 8.75 avg     Total Score = Sum of activity scores/number of activities  Minimally Detectable Change: 3 points (for single activity); 2 points (for average score)   COGNITION: 04/04/2024 Overall cognitive status: Within functional limits for tasks assessed     SENSATION: 04/04/2024 [facility]  EDEMA:  04/04/2024 Noted and not objectively assessed   LOWER EXTREMITY ROM:  Active ROM Left/Right 04/04/2024 Left  04/05/2024 Left 04/10/2024 04/13/24 04/17/24 Left 04/19/24 Left 04/26/2024 Left 05/25/2024 Left 06/07/2024  Knee flexion 62/145 82 with belt  supine 100 Supine AROM heel slide P: 100 (guarding) Seated P: 112* Supine  A: 96* A: 114 Active 125  135 AROM heel slide  Knee extension -5/0 -3 supine -10 in seated LAQ AROM A: -7 (seated LAQ) LAQ A: -5* Standing A: -3* P: 0 Active lacks 3 degrees -2 seated LAQ Lt  0 in supine quad set   (Blank rows = not tested)  LOWER EXTREMITY STRENGTH:  MMT Right  04/04/2024 Left 04/04/2024 Right 04/27/2024 Left 04/30/2024 Left 05/08/2024 Left 05/25/2024 Left 06/07/2024 Right 06/21/2024 Left 06/21/2024  Hip flexion           Hip extension           Hip abduction           Hip adduction           Hip internal rotation           Hip external rotation           Knee flexion           Knee extension 4+ 2+ 81.1 in pounds (236 body weight) 25.7 lbs 31,.9, 28.4 4+/5 52, 48 lbs 5/5 54.5, 54 lbs 5/5 97, 91.5 lbs 5/5 77, 74 lbs  Ankle dorsiflexion           Ankle plantarflexion           Ankle inversion           Ankle eversion            (Blank rows = not  tested)  GAIT: 06/05/2024: Independent ambulation in clinic.    05/04/2024: SPC use today in ambulation in clinic.   04/30/2024: Ambulation c FWW today with improve sequencing noted compared to [facility] in last visits.   04/04/2024 Distance walked: 50 feet Assistive device utilized: [employer] - 2 wheeled Level of assistance: Modified independence Comments: Wiliam was assistive device free before surgery   06/21/2024: 5 x sit to stand : 10.83 seconds without UE assist.                     TREATMENT       DATE: 06/21/2024 Therex: UBE LE only 10 mins for ROM, endurance.  Lvl 4.0 Knee extension machine double leg up, single leg lowering Lt 3x10  20lbs (in available painfree range) Knee flexion machine Lt leg 3 x 10 20 lbs    Continued review of aerobic exercise progression for general cardiovascular health.  Gym activity review for LE.  Verbal cues for HEP review and importance of continued work for continued gains.   TherActivity Flight of stairs up/down reciprocal gait pattern (hand touch for safety only, not WB).  Good control noted with WB on lt leg.  Sit to stand sit x 5 no UE assist.    Self Care Education on vitamin e cream/oil for incision healing.  Cross friction massage education for tissue mobility.    TREATMENT       DATE: 06/07/2024 Therex: Recumbent bike seat 5 lvl 5 10 mins. Incline gastroc stretch 30 sec x 5 bilateral  Knee extension machine double leg up, single leg lowering Lt 2 x 15 20lbs (in available painfree range) Knee flexion machine Lt leg 3 x 10 20 lbs  Continued verbal review of gym based exercise options with machines, bikes etc.  Advice given on progressive resistance changes (increased weight, reps to challenge as able)  TherActivity (to improve stairs, squatting, transfers Step on over and down WB on  Lt leg with light hand assist on bar 6 inch step 2 x 10 Sit to stand to sit 18 inch chair holding 10 lb kettle bell 2 x 10 with slow lowering focus.     TREATMENT       DATE: 06/05/2024 Therex: Recumbent bike seat 5 lvl 5 10 mins.  (:15 second faster interval top of each minute from 2 mins to 7 mins) Incline gastroc stretch 30 sec x 5 bilateral  Knee extension machine double leg up, single leg lowering Lt 3 x 15 15 lbs (in available painfree range) Knee flexion machine Lt leg 3 x 10 15 lbs  Review of mobility intervention in HEP including seated LAQ, supine 90 deg hip flexion LAQ Lt leg x 10 each Supine bridge 2 x 10 2-3 sec hold   Neuro Re-ed (balance improvements, coordination ) SLS with corner touching contralateral leg x 8 each, performed bilaterally with SBA and occasional HHA on bar.    TherActivity (to improve stairs, squatting, transfers Step on over and down WB on Lt leg with light hand assist on bar 4 inch step x 10 Lateral step down 4 inch step x 10 WB on Lt leg   TREATMENT       DATE: 05/31/2024 Therex: Recumbent bike seat 5 lvl 5 8 mins.  (:15 second faster interval top of each minute from 2 mins to 7 mins) Incline gastroc stretch 30 sec x 5 bilateral  Knee extension machine double leg up, single leg lowering Lt 3 x 15 15 lbs (in available painfree range) Knee flexion machine Lt leg 3 x 10 15 lbs   Discussed gym membership options with suggestion to check with insurance about financial coverage.    TherActivity (to improve stairs, squatting, transfers Leg press double leg 137 lbs slow lowering x 15 Leg press single leg Lt slow lowering 2 x 15 68 lbs  Flight of stairs down with reciprocal gait pattern with hand rail assist.  Focused on controlled lowering.   PATIENT EDUCATION:  06/07/2024 :  Education details: HEP update Person educated: Patient Education method: Explanation, Demonstration, Actor cues, Verbal cues, and Handouts Education comprehension: verbalized understanding, returned demonstration, verbal cues required, and tactile cues required  HOME EXERCISE PROGRAM: Access Code: [email or web address] URL:  [email or web address] Date: 06/07/2024 Prepared by: Ozell Silvan  Exercises - Seated Long Arc Quad  - 2-3 x daily - 7 x weekly - 1-2 sets - 10 reps - 2 hold - Tandem Stance  - 1 x daily - 7 x weekly - 1 sets - 10 reps - 20 second hold - Single Leg Stance  - 1 x daily - 7 x weekly - 1 sets - 10 reps - 10 seconds hold - Prone Quadricep Stretch with Strap  - 2 x daily - 7 x weekly - 1 sets - 5-10 reps - 20 seconds hold - Seated Quad Set (Mirrored)  - 3-5 x daily - 7 x weekly - 1 sets - 10 reps - 5 hold - Seated SLR (Mirrored)  - 1-2 x daily - 7 x weekly - 1-2 sets - 10-15 reps - 2 hold - Seated Knee Extension Stretch with Chair (Mirrored)  - 2-3 x daily - 7 x weekly - 1 sets - 1 reps - 3-5 mins hold - Sit to Stand  - 3 x daily - 7 x weekly - 1 sets - 10 reps  ASSESSMENT:  CLINICAL IMPRESSION: Pt has attended 21 visits overall during course of treatment.  Pt presented  well with independent ambulation.  See objective data for updated information.  Continued gains noted in strength and functional ability reporting.  At this time, recommend discharge to HEP and gym based activity.  Pt in agreement with plan.    OBJECTIVE IMPAIRMENTS: Abnormal gait, decreased activity tolerance, decreased endurance, decreased knowledge of condition, difficulty walking, decreased ROM, decreased strength, increased edema, impaired perceived functional ability, and pain.   ACTIVITY LIMITATIONS: bending, sitting, standing, squatting, sleeping, stairs, transfers, bed mobility, dressing, and locomotion level  PARTICIPATION LIMITATIONS: driving, community activity, and yard work  PERSONAL FACTORS: L3-4 lumbar DDD; pre-diabetes; previous back surgery; cardiac history are also affecting patient's functional outcome.    REHAB POTENTIAL: Good  CLINICAL DECISION MAKING: Stable/uncomplicated  EVALUATION COMPLEXITY: Low   GOALS: Goals reviewed with patient? Yes  SHORT TERM GOALS: Target date:  05/16/2024 Kin will be independent with his day 1 home exercise program Baseline: Started 04/04/2024 Goal status: Met 04/25/2024  2.  Improve left knee active range of motion to 3 - 0 -100 degrees Baseline: 5 - 0 - 62 degrees Goal status: Met 04/25/2024  3.  Improve left quadriceps strength as assessed by MMT and functional testing Baseline: 2+/5 at evaluation Goal status: Ongoing   05/17/2024  4.  Tylik will be independent with a single-point cane for ambulation outside the home Baseline: Wheeled walker full-time Goal status: Met 04/25/2024   LONG TERM GOALS: Target date: 06/27/2024  Improve patient specific functional score to at least 5  Goal status: Met 04/26/2024  2.  Jerrid will report left knee pain consistently 0-3/10 on the numeric pain rating scale  Goal status: Met 06/21/2024  3.  Improve left knee AROM to 2 - 0 - 110 degrees or better  Goal status: Met 06/07/2024  4.  Improve left quadriceps strength as assessed by MMT and Kamarii's ability to ambulate without an assistive device 100% of the time  Goal status: Met 06/07/2024  5.  Tong will be independent with his long-term maintenance home exercise program at discharge  Goal status: OMet 06/21/2024    PLAN:  PT FREQUENCY: 2-3x/week  PT DURATION: 12 weeks  PLANNED INTERVENTIONS: 97750- Physical Performance Testing, 97110-Therapeutic exercises, 97530- Therapeutic activity, 97112- Neuromuscular re-education, 97535- Self Care, 02859- Manual therapy, [phone]- Gait training, [phone]- Vasopneumatic device, Patient/Family education, Balance training, Stair training, Joint mobilization, and Cryotherapy  PLAN FOR NEXT SESSION: Discharge.      Ozell Silvan, PT, DPT, OCS, ATC 06/21/24  12:25 PM

## 2024-06-24 ENCOUNTER — Emergency Department (HOSPITAL_BASED_OUTPATIENT_CLINIC_OR_DEPARTMENT_OTHER): Payer: Medicare (Managed Care)

## 2024-06-24 ENCOUNTER — Observation Stay (HOSPITAL_BASED_OUTPATIENT_CLINIC_OR_DEPARTMENT_OTHER)
Admission: EM | Admit: 2024-06-24 | Discharge: 2024-06-25 | Disposition: A | Discharge status: Home / Self Care | Payer: Medicare (Managed Care) | Attending: Internal Medicine | Admitting: Internal Medicine

## 2024-06-24 ENCOUNTER — Emergency Department (HOSPITAL_COMMUNITY): Payer: Medicare (Managed Care)

## 2024-06-24 ENCOUNTER — Other Ambulatory Visit: Payer: Self-pay

## 2024-06-24 ENCOUNTER — Encounter (HOSPITAL_BASED_OUTPATIENT_CLINIC_OR_DEPARTMENT_OTHER): Payer: Self-pay

## 2024-06-24 DIAGNOSIS — I480 Paroxysmal atrial fibrillation: Secondary | ICD-10-CM | POA: Diagnosis present

## 2024-06-24 DIAGNOSIS — Z79899 Other long term (current) drug therapy: Secondary | ICD-10-CM | POA: Diagnosis not present

## 2024-06-24 DIAGNOSIS — J45909 Unspecified asthma, uncomplicated: Secondary | ICD-10-CM | POA: Insufficient documentation

## 2024-06-24 DIAGNOSIS — Z7901 Long term (current) use of anticoagulants: Secondary | ICD-10-CM | POA: Diagnosis not present

## 2024-06-24 DIAGNOSIS — Z87891 Personal history of nicotine dependence: Secondary | ICD-10-CM | POA: Diagnosis not present

## 2024-06-24 DIAGNOSIS — R070 Pain in throat: Secondary | ICD-10-CM | POA: Insufficient documentation

## 2024-06-24 DIAGNOSIS — J029 Acute pharyngitis, unspecified: Secondary | ICD-10-CM

## 2024-06-24 DIAGNOSIS — Z8679 Personal history of other diseases of the circulatory system: Secondary | ICD-10-CM | POA: Diagnosis not present

## 2024-06-24 DIAGNOSIS — R1310 Dysphagia, unspecified: Secondary | ICD-10-CM | POA: Diagnosis not present

## 2024-06-24 DIAGNOSIS — E66811 Obesity, class 1: Secondary | ICD-10-CM | POA: Insufficient documentation

## 2024-06-24 DIAGNOSIS — I471 Supraventricular tachycardia, unspecified: Secondary | ICD-10-CM | POA: Diagnosis present

## 2024-06-24 DIAGNOSIS — Z6833 Body mass index (BMI) 33.0-33.9, adult: Secondary | ICD-10-CM | POA: Diagnosis not present

## 2024-06-24 DIAGNOSIS — Z7984 Long term (current) use of oral hypoglycemic drugs: Secondary | ICD-10-CM | POA: Insufficient documentation

## 2024-06-24 DIAGNOSIS — I2510 Atherosclerotic heart disease of native coronary artery without angina pectoris: Secondary | ICD-10-CM | POA: Diagnosis not present

## 2024-06-24 DIAGNOSIS — Z8739 Personal history of other diseases of the musculoskeletal system and connective tissue: Secondary | ICD-10-CM | POA: Insufficient documentation

## 2024-06-24 DIAGNOSIS — R079 Chest pain, unspecified: Secondary | ICD-10-CM

## 2024-06-24 DIAGNOSIS — Z7902 Long term (current) use of antithrombotics/antiplatelets: Secondary | ICD-10-CM | POA: Diagnosis not present

## 2024-06-24 DIAGNOSIS — E119 Type 2 diabetes mellitus without complications: Secondary | ICD-10-CM | POA: Diagnosis not present

## 2024-06-24 DIAGNOSIS — M549 Dorsalgia, unspecified: Secondary | ICD-10-CM | POA: Insufficient documentation

## 2024-06-24 DIAGNOSIS — G4733 Obstructive sleep apnea (adult) (pediatric): Secondary | ICD-10-CM | POA: Insufficient documentation

## 2024-06-24 DIAGNOSIS — R0602 Shortness of breath: Secondary | ICD-10-CM | POA: Diagnosis present

## 2024-06-24 DIAGNOSIS — G4730 Sleep apnea, unspecified: Secondary | ICD-10-CM | POA: Diagnosis present

## 2024-06-24 DIAGNOSIS — Z96652 Presence of left artificial knee joint: Secondary | ICD-10-CM | POA: Insufficient documentation

## 2024-06-24 DIAGNOSIS — I10 Essential (primary) hypertension: Secondary | ICD-10-CM | POA: Diagnosis present

## 2024-06-24 DIAGNOSIS — I252 Old myocardial infarction: Secondary | ICD-10-CM | POA: Diagnosis not present

## 2024-06-24 DIAGNOSIS — Z9889 Other specified postprocedural states: Secondary | ICD-10-CM | POA: Diagnosis present

## 2024-06-24 LAB — CBC
HCT: 48 % (ref 39.0–52.0)
Hemoglobin: 15.7 g/dL (ref 13.0–17.0)
MCH: 27.3 pg (ref 26.0–34.0)
MCHC: 32.7 g/dL (ref 30.0–36.0)
MCV: 83.3 fL (ref 80.0–100.0)
Platelets: 258 K/uL (ref 150–400)
RBC: 5.76 MIL/uL (ref 4.22–5.81)
RDW: 13.8 % (ref 11.5–15.5)
WBC: 15.8 K/uL — ABNORMAL HIGH (ref 4.0–10.5)
nRBC: 0 % (ref 0.0–0.2)

## 2024-06-24 LAB — CK: Total CK: 25 U/L — ABNORMAL LOW (ref 49–397)

## 2024-06-24 LAB — RESP PANEL BY RT-PCR (RSV, FLU A&B, COVID)  RVPGX2
Influenza A by PCR: NEGATIVE
Influenza B by PCR: NEGATIVE
Resp Syncytial Virus by PCR: NEGATIVE
SARS Coronavirus 2 by RT PCR: NEGATIVE

## 2024-06-24 LAB — BASIC METABOLIC PANEL WITH GFR
Anion gap: 11 (ref 5–15)
BUN: 18 mg/dL (ref 8–23)
CO2: 25 mmol/L (ref 22–32)
Calcium: 9.9 mg/dL (ref 8.9–10.3)
Chloride: 100 mmol/L (ref 98–111)
Creatinine, Ser: 1.31 mg/dL — ABNORMAL HIGH (ref 0.61–1.24)
GFR, Estimated: 59 mL/min — ABNORMAL LOW (ref >60)
Glucose, Bld: 128 mg/dL — ABNORMAL HIGH (ref 70–99)
Potassium: 4.5 mmol/L (ref 3.5–5.1)
Sodium: 136 mmol/L (ref 135–145)

## 2024-06-24 LAB — GROUP A STREP BY PCR: Group A Strep by PCR: NOT DETECTED

## 2024-06-24 LAB — TROPONIN T, HIGH SENSITIVITY
Troponin T High Sensitivity: <15 ng/L (ref 0–19)
Troponin T High Sensitivity: <15 ng/L (ref 0–19)

## 2024-06-24 LAB — LACTIC ACID, PLASMA: Lactic Acid, Venous: 1.7 mmol/L (ref 0.5–1.9)

## 2024-06-24 MED ORDER — DEXAMETHASONE SOD PHOSPHATE PF 10 MG/ML IJ SOLN
10.0000 mg | Freq: Once | INTRAMUSCULAR | Status: AC
  Administered 2024-06-24: 10 mg via INTRAVENOUS

## 2024-06-24 MED ORDER — SODIUM CHLORIDE 0.9 % IV BOLUS
1000.0000 mL | Freq: Once | INTRAVENOUS | Status: AC
  Administered 2024-06-24: 1000 mL via INTRAVENOUS

## 2024-06-24 MED ORDER — INSULIN ASPART 100 UNIT/ML IJ SOLN: 0.0000 [IU] | INTRAMUSCULAR | Status: DC

## 2024-06-24 MED ORDER — LIDOCAINE VISCOUS HCL 2 % MT SOLN
15.0000 mL | Freq: Once | OROMUCOSAL | Status: AC
  Administered 2024-06-24: 15 mL via OROMUCOSAL
  Filled 2024-06-24: qty 15

## 2024-06-24 MED ORDER — ASPIRIN 300 MG RE SUPP
300.0000 mg | Freq: Once | RECTAL | Status: DC
  Filled 2024-06-24: qty 1

## 2024-06-24 MED ORDER — DIPHENHYDRAMINE HCL 50 MG/ML IJ SOLN
25.0000 mg | Freq: Once | INTRAMUSCULAR | Status: AC
  Administered 2024-06-24: 25 mg via INTRAVENOUS
  Filled 2024-06-24: qty 1

## 2024-06-24 MED ORDER — IOHEXOL 350 MG/ML SOLN
125.0000 mL | Freq: Once | INTRAVENOUS | Status: AC | PRN
  Administered 2024-06-24: 100 mL via INTRAVENOUS

## 2024-06-24 MED ORDER — PROCHLORPERAZINE EDISYLATE 10 MG/2ML IJ SOLN
10.0000 mg | Freq: Once | INTRAMUSCULAR | Status: AC
  Administered 2024-06-24: 10 mg via INTRAVENOUS
  Filled 2024-06-24: qty 2

## 2024-06-24 NOTE — ED Notes (Signed)
 CCMD called, pt on monitor

## 2024-06-24 NOTE — ED Notes (Signed)
 Hospitalist at bedside

## 2024-06-24 NOTE — ED Provider Notes (Signed)
  Physical Exam  BP 108/76 (BP Location: Right Arm)   Pulse 93   Temp 97.9 F (36.6 C) (Oral)   Resp 18   SpO2 98%   Physical Exam Vitals and nursing note reviewed.  HENT:     Head: Normocephalic and atraumatic.  Eyes:     Pupils: Pupils are equal, round, and reactive to light.  Cardiovascular:     Rate and Rhythm: Normal rate and regular rhythm.  Pulmonary:     Effort: Pulmonary effort is normal.     Breath sounds: Normal breath sounds.  Abdominal:     Palpations: Abdomen is soft.     Tenderness: There is no abdominal tenderness.  Skin:    General: Skin is warm and dry.  Neurological:     Mental Status: He is alert.  Psychiatric:        Mood and Affect: Mood normal.     Procedures  Procedures  ED Course / MDM   Clinical Course as of 06/24/24 2213  Sun Jun 24, 2024  2213 No evidence of stroke on MRI.  Discussed with GI Dr. Legrand who recommended trial of viscous lidocaine  to see if that help with the dysphagia.  Some of the discomfort was relief but still unable to keep water  down.  Discussed again with Dr. Legrand who recommends admission to medicine with plan for GI evaluation during admission tomorrow morning along with potential ENT evaluation.  Patient admitted to medicine service [MP]    Clinical Course User Index [MP] Pamella Ozell LABOR, DO   Medical Decision Making 68 year old male with history of hyperlipidemia CAD status post DES, A-fib on Xarelto  who presents as transfer from ED at [facility] for [facility].  Presented with complaints of chest pain neck pain along with headache dysphagia with odynophagia.  CTA chest abdomen pelvis along with CT of the neck were unremarkable.  He was unable to swallow and drink water  was transferred here for MRI to evaluate for potential acute CVA as cause of dysphagia.  Amount and/or Complexity of Data Reviewed Labs: ordered. Radiology: ordered.  Risk Prescription drug management. Decision regarding  hospitalization.          Pamella Ozell LABOR, DO 06/24/24 2213

## 2024-06-24 NOTE — ED Provider Notes (Signed)
 [place] [facility] Provider Note   CSN: [id number] Arrival date & time: 06/24/24  1028     Patient presents with: Chest Pain   Jenkins MISHAWN HEMANN is a 68 y.o. male.   HPI     68 year old male with history of hyperlipidemia, GERD, SVT, prediabetes, CAD with DES placed 9/9 mid LAD with Dr. Ladona, paroxysmal atrial fibrillation on xarelto , recent left flank pain with diagnosis of pneumonia in October who presents with multiple concerns including chest pain, neck pain, headache, dysphagia and odynophagia.      330AM woke up with chest pain, took nitroglycerin  and went back to bed Woke up and felt the same but chest pain worse, and having neck pain and pain to top of head When breathe feels like breathing from back, pain in back and chest August 5th had knee replacement, is on xarelto  for afib September 19th had stents from Dr. Ladona Headache increasing and saliva is painful to move, tongue hurts, throat hurts, hurts to swallow, hurts to move tongue Shar pain with a squeezing quality, when belching feels it in both neck and chest Does feel short of breath No numbness/weakness/change in vision. Hurts to talk but not word finding problems.    Past Medical History:  Diagnosis Date   Arthritis    DDD (degenerative disc disease), lumbar    L3/L4   Diverticulosis    Dysrhythmia    SVT s/p ablation   Dysrhythmia    PAF   GERD (gastroesophageal reflux disease)    Hyperlipidemia    Low sperm motility    Myocardial infarction (HCC)    Pre-diabetes    Sleep apnea    uses cpap    SVT (supraventricular tachycardia)      Prior to Admission medications   Medication Sig Start Date End Date Taking? Authorizing Provider  albuterol  (PROAIR  HFA) 108 (90 Base) MCG/ACT inhaler Inhale 1-2 puffs into the lungs every 6 (six) hours as needed for wheezing or shortness of breath. 10/11/23   Kassie Acquanetta Bradley, MD  amoxicillin -clavulanate (AUGMENTIN ) 875-125 MG  tablet Take 1 tablet by mouth 2 (two) times daily. 06/12/24   Saguier, [place], PA-C  aspirin  EC 81 MG tablet Take 1 tablet (81 mg total) by mouth daily. Swallow whole. 05/01/24   Ladona Heinz, MD  azelastine  (ASTELIN ) 0.1 % nasal spray 1-2 sprays per nostril 2 times daily as needed. 11/23/23   Almarie Waddell NOVAK, NP  Blood Pressure Monitoring (BLOOD PRESSURE CUFF) MISC MONITOR BLOOD PRESSURE ONCE DAILY GOAL OF 130/80 05/21/24   Wyn Jackee VEAR Mickey., NP  budesonide -glycopyrrolate -formoterol  (BREZTRI  AEROSPHERE) 160-9-4.8 MCG/ACT AERO inhaler Inhale 2 puffs into the lungs in the morning and at bedtime. 05/10/24   Kassie Acquanetta Bradley, MD  cholecalciferol  (VITAMIN D3) 25 MCG (1000 UNIT) tablet Take 4,000 Units by mouth daily.    [provider]  clopidogrel  (PLAVIX ) 75 MG tablet Take 1 tablet (75 mg total) by mouth daily. 05/21/24   Wyn Jackee VEAR Mickey., NP  docusate sodium  (COLACE) 100 MG capsule Take 1 capsule (100 mg total) by mouth 2 (two) times daily. 05/14/24   Magnant, Charles L, PA-C  ezetimibe  (ZETIA ) 10 MG tablet Take 1 tablet (10 mg total) by mouth daily. 05/21/24   Wyn Jackee VEAR Mickey., NP  fenofibrate  (TRICOR ) 145 MG tablet Take 1 tablet (145 mg total) by mouth daily. 09/14/23   Saguier, Dallas, PA-C  gabapentin  (NEURONTIN ) 100 MG capsule Take 1 capsule (100 mg total) by mouth  2 (two) times daily. 04/11/24   Addie Cordella Hamilton, MD  losartan  (COZAAR ) 25 MG tablet Take 1 tablet (25 mg total) by mouth daily. 09/14/23   Saguier, [place], PA-C  Magnesium  250 MG TABS Take 250 mg by mouth once a week.    [provider]  metFORMIN  (GLUCOPHAGE ) 500 MG tablet TAKE 1 TABLET(500 MG) BY MOUTH TWICE DAILY WITH A MEAL 09/14/23   Saguier, [place], PA-C  methocarbamol  (ROBAXIN ) 500 MG tablet Take 1 tablet (500 mg total) by mouth every 8 (eight) hours as needed for muscle spasms. 05/14/24   Magnant, Carlin CROME, PA-C  metoprolol  tartrate (LOPRESSOR ) 25 MG tablet Take one tablet by mouth twice a day.  May take one  additional tablet by mouth as needed for breakthrough palpitations. 05/21/24   Wyn Jackee VEAR Mickey., NP  nitroGLYCERIN  (NITROSTAT ) 0.4 MG SL tablet Place 1 tablet (0.4 mg total) under the tongue every 5 (five) minutes as needed for up to 25 days for chest pain. Do not use for 24 hours after use of Viagra  05/01/24 06/12/24  Ladona Heinz, MD  oxyCODONE  (ROXICODONE ) 5 MG immediate release tablet Take 1 tablet (5 mg total) by mouth every 8 (eight) hours as needed for severe pain (pain score 7-10). 06/18/24   Vernetta Lonni GRADE, MD  pantoprazole  (PROTONIX ) 40 MG tablet Take 1 tablet (40 mg total) by mouth daily. 10/21/23   Saguier, Dallas, PA-C  PRESCRIPTION MEDICATION Inhale into the lungs at bedtime. CPAP    [provider]  rivaroxaban  (XARELTO ) 20 MG TABS tablet Take 1 tablet (20 mg total) by mouth daily with supper. 05/21/24   Wyn Jackee VEAR Mickey., NP  rosuvastatin  (CRESTOR ) 10 MG tablet Take 1 tablet (10 mg total) by mouth daily. 05/21/24 05/21/25  Wyn Jackee VEAR Mickey., NP  sildenafil  (VIAGRA ) 100 MG tablet Take 1 tablet (100 mg total) by mouth daily as needed for erectile dysfunction. 11/21/23   Waddell Danelle ORN, MD  tirzepatide  (ZEPBOUND ) 7.5 MG/0.5ML Pen Inject 7.5 mg into the skin once a week. 06/06/24   Francyne Romano, MD  Vitamin D , Ergocalciferol , (DRISDOL ) 1.25 MG (50000 UNIT) CAPS capsule Take 1 capsule (50,000 Units total) by mouth every 7 (seven) days. 03/23/24   Saguier, Dallas, PA-C    Allergies: Dust mite extract, Statins, and Tree extract    Review of Systems  Updated Vital Signs BP 123/77 (BP Location: Left Arm)   Pulse 89   Temp 98.1 F (36.7 C) (Oral)   Resp 18   SpO2 97%   Physical Exam Vitals and nursing note reviewed.  Constitutional:      General: He is not in acute distress.    Appearance: He is well-developed. He is not diaphoretic.  HENT:     Head: Normocephalic and atraumatic.     Comments: Difficulty sticking out tongue (reports pain) but is midline Palate  does raise symmetrically but ?weakenss No swelling, no lesions, no erythema Eyes:     Conjunctiva/sclera: Conjunctivae normal.  Neck:     Comments: Tenderness throughout anterior neck Frequent spitting Cardiovascular:     Rate and Rhythm: Normal rate and regular rhythm.     Heart sounds: Normal heart sounds. No murmur heard.    No friction rub. No gallop.  Pulmonary:     Effort: Pulmonary effort is normal. No respiratory distress.     Breath sounds: Normal breath sounds. No wheezing or rales.  Abdominal:     General: There is no distension.     Palpations:  Abdomen is soft.     Tenderness: There is no abdominal tenderness. There is no guarding.  Musculoskeletal:     Cervical back: Normal range of motion.  Skin:    General: Skin is warm and dry.  Neurological:     Mental Status: He is alert and oriented to person, place, and time.     (all labs ordered are listed, but only abnormal results are displayed) Labs Reviewed  BASIC METABOLIC PANEL WITH GFR - Abnormal; Notable for the following components:      Result Value   Glucose, Bld 128 (*)    Creatinine, Ser 1.31 (*)    GFR, Estimated 59 (*)    All other components within normal limits  CBC - Abnormal; Notable for the following components:   WBC 15.8 (*)    All other components within normal limits  CK - Abnormal; Notable for the following components:   Total CK 25 (*)    All other components within normal limits  RESP PANEL BY RT-PCR (RSV, FLU A&B, COVID)  [id number]  GROUP A STREP BY PCR  LACTIC ACID, PLASMA  TROPONIN T, HIGH SENSITIVITY  TROPONIN T, HIGH SENSITIVITY    EKG: EKG Interpretation Date/Time:  Sunday June 24 2024 10:35:01 EST Ventricular Rate:  93 PR Interval:  144 QRS Duration:  90 QT Interval:  353 QTC Calculation: 439 R Axis:   -14  Text Interpretation: Sinus rhythm Abnormal R-wave progression, early transition LVH by voltage No significant change since last tracing Confirmed by Dreama Longs  ([id number]) on 06/24/2024 11:03:58 AM  Radiology: CT Angio Neck W and/or Wo Contrast Result Date: 06/24/2024 EXAM: CTA Neck 06/24/2024 02:42:23 PM TECHNIQUE: CT of the neck was performed with and without the administration of 100 mL of iohexol  (OMNIPAQUE ) 350 mg/mL injection. Multiplanar 2D and/or 3D reformatted images are provided for review. Automated exposure control, iterative reconstruction, and/or weight based adjustment of the mA/kV was utilized to reduce the radiation dose to as low as reasonably achievable. Stenosis of the internal carotid arteries measured using NASCET criteria. COMPARISON: None available CLINICAL HISTORY: FINDINGS: AORTIC ARCH AND ARCH VESSELS: Aortic arch and proximal arch vessels were not included. The included portions of the brachiocephalic and subclavian arteries are patent. CERVICAL CAROTID ARTERIES: Mixed plaque about the left greater than right carotid bifurcations. No dissection, arterial injury, or hemodynamically significant stenosis by NASCET criteria. CERVICAL VERTEBRAL ARTERIES: The vertebral arteries are patent with the right being slightly dominant. Suboptimal arterial opacification, venous contrast, and shoulder artifact limit detailed assessment of both vertebral arteries, particularly in the V1 segments, without evidence of a significant stenosis or dissection where adequately evaluated. LUNGS AND MEDIASTINUM: No apical lung mass or consolidation. SOFT TISSUES: No epiglottic swelling. No peritonsillar or retropharyngeal fluid collection. Widely patent airway. BONES: Focally advanced disc degeneration at C5-C6 with uncovertebral spurring resulting in moderate bilateral foraminal stenosis. No evidence of high grade spinal canal stenosis. IMPRESSION: 1. Suboptimal arterial assessment as described above. 2. Mild cervical carotid atherosclerosis without evidence of a significant stenosis or dissection. 3. No acute abnormality identified in the soft tissues of the neck.  Electronically signed by: Dasie Hamburg MD 06/24/2024 02:59 PM EST RP Workstation: [id number]   CT Angio Chest/Abd/Pel for Dissection W and/or Wo Contrast Result Date: 06/24/2024 EXAM: CTA CHEST, ABDOMEN AND PELVIS WITHOUT AND WITH CONTRAST 06/24/2024 02:42:23 PM TECHNIQUE: CTA of the chest was performed without and with the administration of intravenous contrast. CTA of the abdomen and pelvis was performed without and with  the administration of intravenous contrast. Multiplanar reformatted images are provided for review. MIP images are provided for review. Automated exposure control, iterative reconstruction, and/or weight based adjustment of the mA/kV was utilized to reduce the radiation dose to as low as reasonably achievable. 100 mL of iohexol  (OMNIPAQUE ) 350 MG/ML injection was administered. COMPARISON: CT abdomen and pelvis 09/21/1998 and 09/16/1998. CT of the chest 09/23/2007. CLINICAL HISTORY: Acute aortic syndrome (AAS) suspected. FINDINGS: VASCULATURE: AORTA: Mild calcified atherosclerotic disease throughout the aorta. Atherosclerotic calcifications of the aorta. No abdominal aortic aneurysm. No dissection. PULMONARY ARTERIES: No pulmonary embolism with the limits of this exam. GREAT VESSELS OF AORTIC ARCH: No acute finding. No dissection. No arterial occlusion or significant stenosis. CELIAC TRUNK: No acute finding. No occlusion or significant stenosis. Duodenal artery and Gastric artery arise directly from the aorta. SUPERIOR MESENTERIC ARTERY: No acute finding. No occlusion or significant stenosis. INFERIOR MESENTERIC ARTERY: No acute finding. No occlusion or significant stenosis. RENAL ARTERIES: Accessory renal arteries bilaterally. No occlusion or significant stenosis. ILIAC ARTERIES: No acute finding. No occlusion or significant stenosis. CHEST: MEDIASTINUM: Atherosclerotic calcifications of the coronary arteries. No mediastinal lymphadenopathy. The heart and pericardium demonstrate no acute  abnormality. LUNGS AND PLEURA: Atelectatic changes in the dependent portions of both lower lobes and within the lingula. No focal consolidation or pulmonary edema. No evidence of pleural effusion or pneumothorax. THORACIC BONES AND SOFT TISSUES: No acute bone or soft tissue abnormality. ABDOMEN AND PELVIS: LIVER: The liver is unremarkable. GALLBLADDER AND BILE DUCTS: Gallbladder is unremarkable. No biliary ductal dilatation. SPLEEN: The spleen is unremarkable. PANCREAS: The pancreas is unremarkable. ADRENAL GLANDS: Bilateral adrenal glands demonstrate no acute abnormality. KIDNEYS, URETERS AND BLADDER: Bilateral renal cysts appear stable measuring up to 5.4 cm. No stones in the kidneys or ureters. No hydronephrosis. No perinephric or periureteral stranding. Urinary bladder is unremarkable. GI AND BOWEL: Stomach and duodenal sweep demonstrate no acute abnormality. The appendix appears normal. There are scattered colonic diverticula. There is no bowel obstruction. No abnormal bowel wall thickening or distension. REPRODUCTIVE: Reproductive organs are unremarkable. PERITONEUM AND RETROPERITONEUM: No ascites or free air. LYMPH NODES: No lymphadenopathy. ABDOMINAL BONES AND SOFT TISSUES: Small fat-containing inguinal hernias. Disc spacer is seen at L4-L5. Severe degenerative changes at L2-L3. No acute abnormality of the bones. No acute soft tissue abnormality. IMPRESSION: 1. No evidence of acute aortic syndrome. 2. Mild calcified atherosclerotic atherosclerosis throughout the aorta. Electronically signed by: Greig Pique MD 06/24/2024 02:55 PM EST RP Workstation: [id number]   CT Head Wo Contrast Result Date: 06/24/2024 EXAM: CT HEAD WITHOUT CONTRAST 06/24/2024 02:42:23 PM TECHNIQUE: CT of the head was performed without the administration of intravenous contrast. Automated exposure control, iterative reconstruction, and/or weight based adjustment of the mA/kV was utilized to reduce the radiation dose to as low as  reasonably achievable. COMPARISON: Head CT 10/07/2020. CLINICAL HISTORY: Headache, new onset (Age >= 51y). FINDINGS: BRAIN AND VENTRICLES: There is no evidence of an acute infarct, intracranial hemorrhage, mass, midline shift, hydrocephalus, or extra-axial fluid collection. Mild cerebral atrophy is within normal limits for age. Cerebral white matter hypodensities are similar to the prior study and nonspecific but compatible with mild chronic small vessel ischemic disease. Calcified atherosclerosis at the skull base. ORBITS: No acute abnormality. SINUSES: No acute abnormality. SOFT TISSUES AND SKULL: No acute soft tissue abnormality. No skull fracture. IMPRESSION: 1. No acute intracranial abnormality. 2. Mild chronic small vessel ischemic disease. Electronically signed by: Dasie Hamburg MD 06/24/2024 02:52 PM EST RP Workstation: [id number]   DG  Chest 2 View Result Date: 06/24/2024 EXAM: 2 VIEW(S) XRAY OF THE CHEST 06/24/2024 11:03:40 AM COMPARISON: 06/12/2024 CLINICAL HISTORY: Chest pain FINDINGS: LUNGS AND PLEURA: Previously described opacity in the posterior lung bases is no longer visualized. No pleural effusion. No pneumothorax. HEART AND MEDIASTINUM: No acute abnormality of the cardiac and mediastinal silhouettes. BONES AND SOFT TISSUES: No acute osseous abnormality. IMPRESSION: 1. Previously described posterior basilar opacity has resolved. 2. No active disease. Electronically signed by: Norleen Kil MD 06/24/2024 11:10 AM EST RP Workstation: [id number]     Procedures   Medications Ordered in the ED  prochlorperazine (COMPAZINE) injection 10 mg (10 mg Intravenous Given 06/24/24 1323)  diphenhydrAMINE  (BENADRYL ) injection 25 mg (25 mg Intravenous Given 06/24/24 1323)  dexamethasone (DECADRON) injection 10 mg (10 mg Intravenous Given 06/24/24 1322)  iohexol  (OMNIPAQUE ) 350 MG/ML injection 125 mL (100 mLs Intravenous Contrast Given 06/24/24 1407)                                     68 year old male  with history of hyperlipidemia, GERD, SVT, prediabetes, CAD with DES placed 9/9 mid LAD with Dr. Ladona, paroxysmal atrial fibrillation on xarelto , recent left flank pain with diagnosis of pneumonia in October who presents with multiple concerns including chest pain, neck pain, headache, dysphagia and odynophagia.  Presents with several symptoms and wide differential including ACS, PE, aortic dissection, pericardial effusion, pericarditis, tamponade, RPA, epiglottitis, bacterial or viral pharyngitis, ICH, CVA.  Does not have findings on exam to suggest swelling, anaphylaxis, Ludwig's angina, intraoral lesions/medication reaction/SJS/parotitis.  History is not consistent with a food impaction.  Low suspicion for PE given he is on Xarelto . Not having other symptoms to suggest acute neuromuscular phenomenon/myasthenia/gbs but on differential.    EKG completed by me and shows no acute ST abnormalities, normal sinus rhythm.  Labs completed and personally about interpreted by me show no clinically significant electrolyte abnormalities, creatinine mildly elevated compared to prior.  Has leukocytosis of 15,000.  Troponin is within normal limits x2 and doubt ACS.  Negative COVID, flu, RSV and strep testing.  Chest x-ray completed and shows resolution of previously noted posterior basilar opacity, no active disease.  Given his headache on both Plavix  and Xarelto , CT head was ordered to evaluate for signs of intracranial hemorrhage and was negative.  Given his chest pain and back pain, CTA chest abdomen pelvis was ordered to evaluate for signs of aortic dissection or other acute intrathoracic or intra-abdominal abnormalities and showed no acute findings. CTA neck was obtained which was limited for arterial bolus timing, but does not show signs of epiglottic swelling, no peritonsillar retropharyngeal fluid collection, patent airway.  Discussed that his combination of symptoms including sore throat, odynophagia,  other body aches may indicate a viral syndrome, however he continues to be unable to swallow in the emergency department.  He attempts to drink water  and spits it out reporting he is worried that it will go down the wrong tube--it is painful but he also indicates he does not think he can swallow- ddx for dysphagia includes CVA -- and given new dysphagia and risk factors for CVA, will send to [facility] for MRI and reevaluation.  Had a few low BP recorded however when I entered room he was not wearing cuff and recheck was norma.      Final diagnoses:  Dysphagia, unspecified type  Chest pain, unspecified type  Acute back pain, unspecified  back location, unspecified back pain laterality  Sore throat    ED Discharge Orders     None          Dreama Longs, MD 06/24/24 1605

## 2024-06-24 NOTE — H&P (Incomplete)
 History and Physical    Richard Walker FMW:[id number] DOB: [birth year] DOA: 06/24/2024  Patient coming from: Home.  Chief Complaint: Difficulty swallowing.  HPI: Richard Walker is a 68 y.o. male with history of CAD status post PCI on May 01, 2024, diabetes mellitus type 2, sleep apnea, atrial fibrillation, SVT status post ablation presents to the ER with complaints of pain and difficulty swallowing since waking up this morning.  Patient initially was concerned that it may be his heart.  Denies any associated shortness of breath productive cough fever or chills.  He felt his tongue was getting numb.  When he tried to drink something it was painful and he had to throw up.  Due to persistent symptoms he presented to the ER.  ED Course: In the ER patient underwent extensive workup including CT angiogram of the neck with and without contrast, CT head, CT angiogram chest abdomen pelvis and MRI brain which all did not show anything acute.  On-call gastroenterologist Dr. Legrand from [facility] was consulted.  Patient was initially given a dose of Decadron and Benadryl  IV.  Patient admitted for further workup of odynophagia/dysphagia.  Labs showed WBC of 15.8 troponins were negative.  EKG shows normal sinus rhythm.  Review of Systems: As per HPI, rest all negative.   Past Medical History:  Diagnosis Date   Arthritis    DDD (degenerative disc disease), lumbar    L3/L4   Diverticulosis    Dysrhythmia    SVT s/p ablation   Dysrhythmia    PAF   GERD (gastroesophageal reflux disease)    Hyperlipidemia    Low sperm motility    Myocardial infarction (HCC)    Pre-diabetes    Sleep apnea    uses cpap    SVT (supraventricular tachycardia)     Past Surgical History:  Procedure Laterality Date   AMPUTATION Left 07/15/2018   Procedure: . REPAIR OF LEFT INDEX AND LONG FINGER BY FUSION /  REPAIR OF RING FINGER;  Surgeon: Sebastian Lenis, MD;  Location: [facility] OR;  Service: Orthopedics;  Laterality: Left;    BACK SURGERY     BLALOCK PROCEDURE     Ablation   CARDIAC CATHETERIZATION N/A 12/22/2015   Procedure: Left Heart Cath and Coronary Angiography;  Surgeon: Peter M Jordan, MD;  Location: [facility] INVASIVE CV LAB;  Service: Cardiovascular;  Laterality: N/A;   CARDIAC ELECTROPHYSIOLOGY STUDY AND ABLATION     CORONARY STENT INTERVENTION N/A 05/01/2024   Procedure: CORONARY STENT INTERVENTION;  Surgeon: Ladona Heinz, MD;  Location: [facility] INVASIVE CV LAB;  Service: Cardiovascular;  Laterality: N/A;   ELECTROPHYSIOLOGIC STUDY N/A 02/19/2016   Procedure: SVT Ablation;  Surgeon: Danelle LELON Birmingham, MD;  Location: [facility] INVASIVE CV LAB;  Service: Cardiovascular;  Laterality: N/A;   HAND SURGERY     LEFT HEART CATH AND CORONARY ANGIOGRAPHY N/A 05/01/2024   Procedure: LEFT HEART CATH AND CORONARY ANGIOGRAPHY;  Surgeon: Ladona Heinz, MD;  Location: [facility] INVASIVE CV LAB;  Service: Cardiovascular;  Laterality: N/A;   septal reconstruction  2017   TOTAL KNEE ARTHROPLASTY Left 03/27/2024   Procedure: ARTHROPLASTY, KNEE, TOTAL, LEFT;  Surgeon: Addie Cordella Hamilton, MD;  Location: [facility] OR;  Service: Orthopedics;  Laterality: Left;   TURBINATE REDUCTION Bilateral 07/2016   WISDOM TOOTH EXTRACTION       reports that he quit smoking about 33 years ago. His smoking use included cigarettes. He started smoking about 43 years ago. He has a 10 pack-year smoking history. He has never  used smokeless tobacco. He reports that he does not drink alcohol and does not use drugs.  Allergies  Allergen Reactions   Dust Mite Extract    Statins     Muscle aches   Tree Extract Other (See Comments)    Intolerance Grass    Family History  Problem Relation Age of Onset   Stroke Mother    Heart disease Mother    High Cholesterol Mother    Sleep apnea Father    Hypertension Father    Food Allergy Son    Colon cancer Neg Hx    Stomach cancer Neg Hx    Allergic rhinitis Neg Hx    Angioedema Neg Hx    Asthma Neg Hx    Eczema Neg Hx     Immunodeficiency Neg Hx    Urticaria Neg Hx     Prior to Admission medications   Medication Sig Start Date End Date Taking? Authorizing Provider  albuterol  (PROAIR  HFA) 108 (90 Base) MCG/ACT inhaler Inhale 1-2 puffs into the lungs every 6 (six) hours as needed for wheezing or shortness of breath. 10/11/23   Kassie Acquanetta Bradley, MD  amoxicillin -clavulanate (AUGMENTIN ) 875-125 MG tablet Take 1 tablet by mouth 2 (two) times daily. 06/12/24   Saguier, [place], PA-C  aspirin  EC 81 MG tablet Take 1 tablet (81 mg total) by mouth daily. Swallow whole. 05/01/24   Ladona Heinz, MD  azelastine  (ASTELIN ) 0.1 % nasal spray 1-2 sprays per nostril 2 times daily as needed. 11/23/23   Almarie Waddell NOVAK, NP  budesonide -glycopyrrolate -formoterol  (BREZTRI  AEROSPHERE) 160-9-4.8 MCG/ACT AERO inhaler Inhale 2 puffs into the lungs in the morning and at bedtime. 05/10/24   Kassie Acquanetta Bradley, MD  cholecalciferol  (VITAMIN D3) 25 MCG (1000 UNIT) tablet Take 4,000 Units by mouth daily.    [provider]  clopidogrel  (PLAVIX ) 75 MG tablet Take 1 tablet (75 mg total) by mouth daily. 05/21/24   Wyn Jackee VEAR Mickey., NP  docusate sodium  (COLACE) 100 MG capsule Take 1 capsule (100 mg total) by mouth 2 (two) times daily. 05/14/24   Magnant, Charles L, PA-C  ezetimibe  (ZETIA ) 10 MG tablet Take 1 tablet (10 mg total) by mouth daily. 05/21/24   Wyn Jackee VEAR Mickey., NP  fenofibrate  (TRICOR ) 145 MG tablet Take 1 tablet (145 mg total) by mouth daily. 09/14/23   Saguier, Dallas, PA-C  gabapentin  (NEURONTIN ) 100 MG capsule Take 1 capsule (100 mg total) by mouth 2 (two) times daily. 04/11/24   Addie Cordella Hamilton, MD  losartan  (COZAAR ) 25 MG tablet Take 1 tablet (25 mg total) by mouth daily. 09/14/23   Saguier, Dallas, PA-C  Magnesium  250 MG TABS Take 250 mg by mouth once a week.    [provider]  metFORMIN  (GLUCOPHAGE ) 500 MG tablet TAKE 1 TABLET(500 MG) BY MOUTH TWICE DAILY WITH A MEAL 09/14/23   Saguier, Dallas, PA-C  methocarbamol   (ROBAXIN ) 500 MG tablet Take 1 tablet (500 mg total) by mouth every 8 (eight) hours as needed for muscle spasms. 05/14/24   Magnant, Carlin CROME, PA-C  metoprolol  tartrate (LOPRESSOR ) 25 MG tablet Take one tablet by mouth twice a day.  May take one additional tablet by mouth as needed for breakthrough palpitations. 05/21/24   Wyn Jackee VEAR Mickey., NP  nitroGLYCERIN  (NITROSTAT ) 0.4 MG SL tablet Place 1 tablet (0.4 mg total) under the tongue every 5 (five) minutes as needed for up to 25 days for chest pain. Do not use for 24 hours after use of Viagra   05/01/24 06/12/24  Ladona Heinz, MD  oxyCODONE  (ROXICODONE ) 5 MG immediate release tablet Take 1 tablet (5 mg total) by mouth every 8 (eight) hours as needed for severe pain (pain score 7-10). 06/18/24   Vernetta Lonni GRADE, MD  pantoprazole  (PROTONIX ) 40 MG tablet Take 1 tablet (40 mg total) by mouth daily. 10/21/23   Saguier, Dallas, PA-C  rivaroxaban  (XARELTO ) 20 MG TABS tablet Take 1 tablet (20 mg total) by mouth daily with supper. 05/21/24   Wyn Jackee VEAR Mickey., NP  rosuvastatin  (CRESTOR ) 10 MG tablet Take 1 tablet (10 mg total) by mouth daily. 05/21/24 05/21/25  Wyn Jackee VEAR Mickey., NP  sildenafil  (VIAGRA ) 100 MG tablet Take 1 tablet (100 mg total) by mouth daily as needed for erectile dysfunction. 11/21/23   Waddell Danelle ORN, MD  tirzepatide  (ZEPBOUND ) 7.5 MG/0.5ML Pen Inject 7.5 mg into the skin once a week. 06/06/24   Francyne Romano, MD  Vitamin D , Ergocalciferol , (DRISDOL ) 1.25 MG (50000 UNIT) CAPS capsule Take 1 capsule (50,000 Units total) by mouth every 7 (seven) days. 03/23/24   Saguier, Dallas, PA-C    Physical Exam: Constitutional: Moderately built and nourished. Vitals:   06/24/24 1734 06/24/24 2130 06/24/24 2330 06/24/24 2338  BP: 108/76  137/81   Pulse: 93  84   Resp: 18  14   Temp: 98.1 F (36.7 C) 97.9 F (36.6 C)  98 F (36.7 C)  TempSrc: Oral Oral  Oral  SpO2: 98%  98%    Eyes: Anicteric no pallor. ENMT: No discharge from the ears  eyes nose or mouth. Neck: No mass felt.  No neck rigidity. Respiratory: No rhonchi or crepitations. Cardiovascular: S1-S2 heard. Abdomen: Soft nontender bowel sound present. Musculoskeletal: No edema. Skin: No rash. Neurologic: Alert awake oriented time place and person.  Moves all extremities. Psychiatric: Appears normal.  Normal affect.   Labs on Admission: I have personally reviewed following labs and imaging studies  CBC: Recent Labs  Lab 06/24/24 1052  WBC 15.8*  HGB 15.7  HCT 48.0  MCV 83.3  PLT 258   Basic Metabolic Panel: Recent Labs  Lab 06/24/24 1052  NA 136  K 4.5  CL 100  CO2 25  GLUCOSE 128*  BUN 18  CREATININE 1.31*  CALCIUM  9.9   GFR: Estimated Creatinine Clearance: 62.5 mL/min (A) (by C-G formula based on SCr of 1.31 mg/dL (H)). Liver Function Tests: No results for input(s): AST, ALT, ALKPHOS, BILITOT, PROT, ALBUMIN in the last 168 hours. No results for input(s): LIPASE, AMYLASE in the last 168 hours. No results for input(s): AMMONIA in the last 168 hours. Coagulation Profile: No results for input(s): INR, PROTIME in the last 168 hours. Cardiac Enzymes: Recent Labs  Lab 06/24/24 1052  CKTOTAL 25*   BNP (last 3 results) Recent Labs    11/10/23 1616 01/18/24 0935 06/12/24 1142  PROBNP 124.0* 20.0 42.0   HbA1C: No results for input(s): HGBA1C in the last 72 hours. CBG: No results for input(s): GLUCAP in the last 168 hours. Lipid Profile: No results for input(s): CHOL, HDL, LDLCALC, TRIG, CHOLHDL, LDLDIRECT in the last 72 hours. Thyroid  Function Tests: No results for input(s): TSH, T4TOTAL, FREET4, T3FREE, THYROIDAB in the last 72 hours. Anemia Panel: No results for input(s): VITAMINB12, FOLATE, FERRITIN, TIBC, IRON, RETICCTPCT in the last 72 hours. Urine analysis:    Component Value Date/Time   COLORURINE YELLOW 06/07/2024 1734   APPEARANCEUR CLEAR 06/07/2024 1734    LABSPEC >=1.030 06/07/2024 1734   PHURINE 6.5 06/07/2024  1734   GLUCOSEU NEGATIVE 06/07/2024 1734   GLUCOSEU NEGATIVE 10/24/2017 1023   HGBUR TRACE (A) 06/07/2024 1734   BILIRUBINUR small 06/12/2024 1132   KETONESUR NEGATIVE 06/07/2024 1734   PROTEINUR Negative 06/12/2024 1132   PROTEINUR NEGATIVE 06/07/2024 1734   UROBILINOGEN 0.2 06/12/2024 1132   UROBILINOGEN 0.2 10/24/2017 1023   NITRITE neg 06/12/2024 1132   NITRITE NEGATIVE 06/07/2024 1734   LEUKOCYTESUR Negative 06/12/2024 1132   LEUKOCYTESUR NEGATIVE 06/07/2024 1734   Sepsis Labs: @LABRCNTIP (procalcitonin:4,lacticidven:4) ) Recent Results (from the past 240 hours)  Resp panel by RT-PCR (RSV, Flu A&B, Covid) Anterior Nasal Swab     Status: None   Collection Time: 06/24/24 10:48 AM   Specimen: Anterior Nasal Swab  Result Value Ref Range Status   SARS Coronavirus 2 by RT PCR NEGATIVE NEGATIVE Final    Comment: (NOTE) SARS-CoV-2 target nucleic acids are NOT DETECTED.  The SARS-CoV-2 RNA is generally detectable in upper respiratory specimens during the acute phase of infection. The lowest concentration of SARS-CoV-2 viral copies this assay can detect is 138 copies/mL. A negative result does not preclude SARS-Cov-2 infection and should not be used as the sole basis for treatment or other patient management decisions. A negative result may occur with  improper specimen collection/handling, submission of specimen other than nasopharyngeal swab, presence of viral mutation(s) within the areas targeted by this assay, and inadequate number of viral copies(<138 copies/mL). A negative result must be combined with clinical observations, patient history, and epidemiological information. The expected result is Negative.  Fact Sheet for Patients:  bloggercourse.com  Fact Sheet for Healthcare Providers:  seriousbroker.it  This test is no t yet approved or cleared by the [place]  [place] FDA and  has been authorized for detection and/or diagnosis of SARS-CoV-2 by FDA under an Emergency Use Authorization (EUA). This EUA will remain  in effect (meaning this test can be used) for the duration of the COVID-19 declaration under Section 564(b)(1) of the Act, 21 U.S.C.section 360bbb-3(b)(1), unless the authorization is terminated  or revoked sooner.       Influenza A by PCR NEGATIVE NEGATIVE Final   Influenza B by PCR NEGATIVE NEGATIVE Final    Comment: (NOTE) The Xpert Xpress SARS-CoV-2/FLU/RSV plus assay is intended as an aid in the diagnosis of influenza from Nasopharyngeal swab specimens and should not be used as a sole basis for treatment. Nasal washings and aspirates are unacceptable for Xpert Xpress SARS-CoV-2/FLU/RSV testing.  Fact Sheet for Patients: bloggercourse.com  Fact Sheet for Healthcare Providers: seriousbroker.it  This test is not yet approved or cleared by the [place]  FDA and has been authorized for detection and/or diagnosis of SARS-CoV-2 by FDA under an Emergency Use Authorization (EUA). This EUA will remain in effect (meaning this test can be used) for the duration of the COVID-19 declaration under Section 564(b)(1) of the Act, 21 U.S.C. section 360bbb-3(b)(1), unless the authorization is terminated or revoked.     Resp Syncytial Virus by PCR NEGATIVE NEGATIVE Final    Comment: (NOTE) Fact Sheet for Patients: bloggercourse.com  Fact Sheet for Healthcare Providers: seriousbroker.it  This test is not yet approved or cleared by the [place]  FDA and has been authorized for detection and/or diagnosis of SARS-CoV-2 by FDA under an Emergency Use Authorization (EUA). This EUA will remain in effect (meaning this test can be used) for the duration of the COVID-19 declaration under Section 564(b)(1) of the Act, 21 U.S.C. section  360bbb-3(b)(1), unless the authorization is terminated or revoked.  Performed  at [facility], [address] [place], [place] [address]   Group A Strep by PCR     Status: None   Collection Time: 06/24/24 10:48 AM   Specimen: Throat; Sterile Swab  Result Value Ref Range Status   Group A Strep by PCR NOT DETECTED NOT DETECTED Final    Comment: Performed at [facility], [address] [place], [place] [address]     Radiological Exams on Admission: MR BRAIN WO CONTRAST Result Date: 06/24/2024 EXAM: MRI BRAIN WITHOUT CONTRAST 06/24/2024 06:34:55 PM TECHNIQUE: Multiplanar multisequence MRI of the head/brain was performed without the administration of intravenous contrast. COMPARISON: Head CT 06/24/2024. CLINICAL HISTORY: Palate weakness (CN 9). FINDINGS: BRAIN AND VENTRICLES: There is no evidence of an acute infarct, intracranial hemorrhage, mass, midline shift, hydrocephalus, or extra-axial fluid collection. Mild cerebral atrophy is within normal limits for age. T2 hyperintensities in the cerebral white matter bilaterally are nonspecific but compatible with mild chronic small vessel ischemic disease. Major intracranial vascular flow voids are preserved. ORBITS: No acute abnormality. SINUSES AND MASTOIDS: No acute abnormality. BONES AND SOFT TISSUES: Normal marrow signal. No acute soft tissue abnormality. IMPRESSION: 1. No acute intracranial abnormality. 2. Mild chronic small vessel ischemic disease. Electronically signed by: Dasie Hamburg MD 06/24/2024 06:49 PM EST RP Workstation: [id number]   CT Angio Neck W and/or Wo Contrast Result Date: 06/24/2024 EXAM: CTA Neck 06/24/2024 02:42:23 PM TECHNIQUE: CT of the neck was performed with and without the administration of 100 mL of iohexol  (OMNIPAQUE ) 350 mg/mL injection. Multiplanar 2D and/or 3D reformatted images are provided for review. Automated exposure control, iterative reconstruction, and/or weight based adjustment of the  mA/kV was utilized to reduce the radiation dose to as low as reasonably achievable. Stenosis of the internal carotid arteries measured using NASCET criteria. COMPARISON: None available CLINICAL HISTORY: FINDINGS: AORTIC ARCH AND ARCH VESSELS: Aortic arch and proximal arch vessels were not included. The included portions of the brachiocephalic and subclavian arteries are patent. CERVICAL CAROTID ARTERIES: Mixed plaque about the left greater than right carotid bifurcations. No dissection, arterial injury, or hemodynamically significant stenosis by NASCET criteria. CERVICAL VERTEBRAL ARTERIES: The vertebral arteries are patent with the right being slightly dominant. Suboptimal arterial opacification, venous contrast, and shoulder artifact limit detailed assessment of both vertebral arteries, particularly in the V1 segments, without evidence of a significant stenosis or dissection where adequately evaluated. LUNGS AND MEDIASTINUM: No apical lung mass or consolidation. SOFT TISSUES: No epiglottic swelling. No peritonsillar or retropharyngeal fluid collection. Widely patent airway. BONES: Focally advanced disc degeneration at C5-C6 with uncovertebral spurring resulting in moderate bilateral foraminal stenosis. No evidence of high grade spinal canal stenosis. IMPRESSION: 1. Suboptimal arterial assessment as described above. 2. Mild cervical carotid atherosclerosis without evidence of a significant stenosis or dissection. 3. No acute abnormality identified in the soft tissues of the neck. Electronically signed by: Dasie Hamburg MD 06/24/2024 02:59 PM EST RP Workstation: [id number]   CT Angio Chest/Abd/Pel for Dissection W and/or Wo Contrast Result Date: 06/24/2024 EXAM: CTA CHEST, ABDOMEN AND PELVIS WITHOUT AND WITH CONTRAST 06/24/2024 02:42:23 PM TECHNIQUE: CTA of the chest was performed without and with the administration of intravenous contrast. CTA of the abdomen and pelvis was performed without and with the  administration of intravenous contrast. Multiplanar reformatted images are provided for review. MIP images are provided for review. Automated exposure control, iterative reconstruction, and/or weight based adjustment of the mA/kV was utilized to reduce the radiation dose to as low  as reasonably achievable. 100 mL of iohexol  (OMNIPAQUE ) 350 MG/ML injection was administered. COMPARISON: CT abdomen and pelvis 09/21/1998 and 09/16/1998. CT of the chest 09/23/2007. CLINICAL HISTORY: Acute aortic syndrome (AAS) suspected. FINDINGS: VASCULATURE: AORTA: Mild calcified atherosclerotic disease throughout the aorta. Atherosclerotic calcifications of the aorta. No abdominal aortic aneurysm. No dissection. PULMONARY ARTERIES: No pulmonary embolism with the limits of this exam. GREAT VESSELS OF AORTIC ARCH: No acute finding. No dissection. No arterial occlusion or significant stenosis. CELIAC TRUNK: No acute finding. No occlusion or significant stenosis. Duodenal artery and Gastric artery arise directly from the aorta. SUPERIOR MESENTERIC ARTERY: No acute finding. No occlusion or significant stenosis. INFERIOR MESENTERIC ARTERY: No acute finding. No occlusion or significant stenosis. RENAL ARTERIES: Accessory renal arteries bilaterally. No occlusion or significant stenosis. ILIAC ARTERIES: No acute finding. No occlusion or significant stenosis. CHEST: MEDIASTINUM: Atherosclerotic calcifications of the coronary arteries. No mediastinal lymphadenopathy. The heart and pericardium demonstrate no acute abnormality. LUNGS AND PLEURA: Atelectatic changes in the dependent portions of both lower lobes and within the lingula. No focal consolidation or pulmonary edema. No evidence of pleural effusion or pneumothorax. THORACIC BONES AND SOFT TISSUES: No acute bone or soft tissue abnormality. ABDOMEN AND PELVIS: LIVER: The liver is unremarkable. GALLBLADDER AND BILE DUCTS: Gallbladder is unremarkable. No biliary ductal dilatation. SPLEEN: The  spleen is unremarkable. PANCREAS: The pancreas is unremarkable. ADRENAL GLANDS: Bilateral adrenal glands demonstrate no acute abnormality. KIDNEYS, URETERS AND BLADDER: Bilateral renal cysts appear stable measuring up to 5.4 cm. No stones in the kidneys or ureters. No hydronephrosis. No perinephric or periureteral stranding. Urinary bladder is unremarkable. GI AND BOWEL: Stomach and duodenal sweep demonstrate no acute abnormality. The appendix appears normal. There are scattered colonic diverticula. There is no bowel obstruction. No abnormal bowel wall thickening or distension. REPRODUCTIVE: Reproductive organs are unremarkable. PERITONEUM AND RETROPERITONEUM: No ascites or free air. LYMPH NODES: No lymphadenopathy. ABDOMINAL BONES AND SOFT TISSUES: Small fat-containing inguinal hernias. Disc spacer is seen at L4-L5. Severe degenerative changes at L2-L3. No acute abnormality of the bones. No acute soft tissue abnormality. IMPRESSION: 1. No evidence of acute aortic syndrome. 2. Mild calcified atherosclerotic atherosclerosis throughout the aorta. Electronically signed by: Greig Pique MD 06/24/2024 02:55 PM EST RP Workstation: [id number]   CT Head Wo Contrast Result Date: 06/24/2024 EXAM: CT HEAD WITHOUT CONTRAST 06/24/2024 02:42:23 PM TECHNIQUE: CT of the head was performed without the administration of intravenous contrast. Automated exposure control, iterative reconstruction, and/or weight based adjustment of the mA/kV was utilized to reduce the radiation dose to as low as reasonably achievable. COMPARISON: Head CT 10/07/2020. CLINICAL HISTORY: Headache, new onset (Age >= 51y). FINDINGS: BRAIN AND VENTRICLES: There is no evidence of an acute infarct, intracranial hemorrhage, mass, midline shift, hydrocephalus, or extra-axial fluid collection. Mild cerebral atrophy is within normal limits for age. Cerebral white matter hypodensities are similar to the prior study and nonspecific but compatible with mild chronic  small vessel ischemic disease. Calcified atherosclerosis at the skull base. ORBITS: No acute abnormality. SINUSES: No acute abnormality. SOFT TISSUES AND SKULL: No acute soft tissue abnormality. No skull fracture. IMPRESSION: 1. No acute intracranial abnormality. 2. Mild chronic small vessel ischemic disease. Electronically signed by: Dasie Hamburg MD 06/24/2024 02:52 PM EST RP Workstation: [id number]   DG Chest 2 View Result Date: 06/24/2024 EXAM: 2 VIEW(S) XRAY OF THE CHEST 06/24/2024 11:03:40 AM COMPARISON: 06/12/2024 CLINICAL HISTORY: Chest pain FINDINGS: LUNGS AND PLEURA: Previously described opacity in the posterior lung bases is no longer visualized. No pleural  effusion. No pneumothorax. HEART AND MEDIASTINUM: No acute abnormality of the cardiac and mediastinal silhouettes. BONES AND SOFT TISSUES: No acute osseous abnormality. IMPRESSION: 1. Previously described posterior basilar opacity has resolved. 2. No active disease. Electronically signed by: Norleen Kil MD 06/24/2024 11:10 AM EST RP Workstation: [id number]    EKG: Independently reviewed.  Normal sinus rhythm.  Assessment/Plan Principal Problem:   Odynophagia Active Problems:   Sleep apnea   SVT (supraventricular tachycardia)   Paroxysmal atrial fibrillation (HCC)   Hypertension   CAD (coronary artery disease)   Dysphagia   Diabetes mellitus type 2 in nonobese (HCC)    Odynophagia/dysphagia cause not clear.  [facility] Dr. Legrand has been consulted.  Will keep patient n.p.o. for now. CAD status post recent PCI on May 01, 2024.  I discussed with on-call cardiologist Dr. Shelda Bruckner who advised that since patient needs to be on antiplatelet agents and if not able to continue to take it orally then advised to start patient on cangrelor infusion.  And once patient can take orally start back Plavix  then discontinue cangrelor. Paroxysmal atrial fibrillation takes Xarelto  presently on heparin  infusion.  Per cardiology okay to  continue heparin  infusion and cangrelor infusion.  Will keep patient on scheduled dose of metoprolol  IV. History of SVT status post ablation. Sleep apnea on CPAP at bedtime. Diabetes mellitus type 2 takes Zepbound  and metformin  last hemoglobin A1c was 5.4 two  weeks ago.  Presently on sliding scale coverage. History of chronic knee pain on oxycodone  confirmed on PDMP website presently on morphine .   DVT prophylaxis: Heparin  infusion. Code Status: Full code. Family Communication: Discussed with patient. Disposition Plan: Monitored bed. Consults called: Discussed with cardiology Dr. Bruckner.Bridgette.  [facility] Dr. Legrand consulted. Admission status: Observation.

## 2024-06-24 NOTE — ED Notes (Signed)
 Pt taken to MRI

## 2024-06-24 NOTE — ED Notes (Signed)
 Called [facility] to transport the patient to [facility] for MRI--Dr. Pamella is accepting

## 2024-06-24 NOTE — ED Triage Notes (Signed)
 Pt reports chest pain & throat pain that started last night.  Also reports generalized weakness. Endorses right leg/calf pain.

## 2024-06-24 NOTE — ED Notes (Signed)
 MRI called.  Stated they will come to pick up the pt within the hour.

## 2024-06-25 ENCOUNTER — Encounter: Payer: Self-pay | Admitting: Radiology

## 2024-06-25 ENCOUNTER — Encounter (HOSPITAL_COMMUNITY)
Admission: EM | Disposition: A | Discharge status: Home / Self Care | Payer: Self-pay | Source: Home / Self Care | Attending: Emergency Medicine

## 2024-06-25 ENCOUNTER — Observation Stay (HOSPITAL_COMMUNITY): Payer: Medicare (Managed Care) | Admitting: Anesthesiology

## 2024-06-25 ENCOUNTER — Encounter (HOSPITAL_COMMUNITY): Payer: Self-pay | Admitting: Internal Medicine

## 2024-06-25 DIAGNOSIS — R1310 Dysphagia, unspecified: Secondary | ICD-10-CM

## 2024-06-25 DIAGNOSIS — R079 Chest pain, unspecified: Secondary | ICD-10-CM

## 2024-06-25 DIAGNOSIS — K219 Gastro-esophageal reflux disease without esophagitis: Secondary | ICD-10-CM | POA: Diagnosis not present

## 2024-06-25 DIAGNOSIS — I10 Essential (primary) hypertension: Secondary | ICD-10-CM | POA: Diagnosis not present

## 2024-06-25 DIAGNOSIS — Z7902 Long term (current) use of antithrombotics/antiplatelets: Secondary | ICD-10-CM | POA: Diagnosis not present

## 2024-06-25 DIAGNOSIS — Z87891 Personal history of nicotine dependence: Secondary | ICD-10-CM

## 2024-06-25 DIAGNOSIS — I480 Paroxysmal atrial fibrillation: Secondary | ICD-10-CM | POA: Diagnosis not present

## 2024-06-25 DIAGNOSIS — I2510 Atherosclerotic heart disease of native coronary artery without angina pectoris: Secondary | ICD-10-CM

## 2024-06-25 DIAGNOSIS — Z955 Presence of coronary angioplasty implant and graft: Secondary | ICD-10-CM

## 2024-06-25 HISTORY — PX: ESOPHAGOGASTRODUODENOSCOPY: SHX5428

## 2024-06-25 LAB — CBC
HCT: 41.8 % (ref 39.0–52.0)
Hemoglobin: 13.9 g/dL (ref 13.0–17.0)
MCH: 27.3 pg (ref 26.0–34.0)
MCHC: 33.3 g/dL (ref 30.0–36.0)
MCV: 82 fL (ref 80.0–100.0)
Platelets: 234 K/uL (ref 150–400)
RBC: 5.1 MIL/uL (ref 4.22–5.81)
RDW: 13.7 % (ref 11.5–15.5)
WBC: 12.6 K/uL — ABNORMAL HIGH (ref 4.0–10.5)
nRBC: 0 % (ref 0.0–0.2)

## 2024-06-25 LAB — COMPREHENSIVE METABOLIC PANEL WITH GFR
ALT: 12 U/L (ref 0–44)
AST: 13 U/L — ABNORMAL LOW (ref 15–41)
Albumin: 3.2 g/dL — ABNORMAL LOW (ref 3.5–5.0)
Alkaline Phosphatase: 46 U/L (ref 38–126)
Anion gap: 10 (ref 5–15)
BUN: 12 mg/dL (ref 8–23)
CO2: 23 mmol/L (ref 22–32)
Calcium: 9.2 mg/dL (ref 8.9–10.3)
Chloride: 102 mmol/L (ref 98–111)
Creatinine, Ser: 1.06 mg/dL (ref 0.61–1.24)
GFR, Estimated: >60 mL/min (ref >60)
Glucose, Bld: 137 mg/dL — ABNORMAL HIGH (ref 70–99)
Potassium: 4.2 mmol/L (ref 3.5–5.1)
Sodium: 135 mmol/L (ref 135–145)
Total Bilirubin: 0.7 mg/dL (ref 0.0–1.2)
Total Protein: 6.3 g/dL — ABNORMAL LOW (ref 6.5–8.1)

## 2024-06-25 LAB — GLUCOSE, CAPILLARY
Glucose-Capillary: 134 mg/dL — ABNORMAL HIGH (ref 70–99)
Glucose-Capillary: 126 mg/dL — ABNORMAL HIGH (ref 70–99)
Glucose-Capillary: 112 mg/dL — ABNORMAL HIGH (ref 70–99)

## 2024-06-25 LAB — CBG MONITORING, ED: Glucose-Capillary: 137 mg/dL — ABNORMAL HIGH (ref 70–99)

## 2024-06-25 LAB — HIV ANTIBODY (ROUTINE TESTING W REFLEX): HIV Screen 4th Generation wRfx: NONREACTIVE

## 2024-06-25 SURGERY — EGD (ESOPHAGOGASTRODUODENOSCOPY)
Anesthesia: General

## 2024-06-25 MED ORDER — METOPROLOL TARTRATE 5 MG/5ML IV SOLN
2.5000 mg | Freq: Three times a day (TID) | INTRAVENOUS | Status: DC
  Administered 2024-06-25: 2.5 mg via INTRAVENOUS
  Filled 2024-06-25: qty 5

## 2024-06-25 MED ORDER — PANTOPRAZOLE SODIUM 40 MG PO TBEC
40.0000 mg | DELAYED_RELEASE_TABLET | Freq: Every day | ORAL | Status: DC
  Administered 2024-06-25: 40 mg via ORAL
  Filled 2024-06-25: qty 1

## 2024-06-25 MED ORDER — ROSUVASTATIN CALCIUM 5 MG PO TABS
10.0000 mg | ORAL_TABLET | Freq: Every day | ORAL | Status: DC
  Administered 2024-06-25: 10 mg via ORAL
  Filled 2024-06-25: qty 2

## 2024-06-25 MED ORDER — METHOCARBAMOL 500 MG PO TABS
500.0000 mg | ORAL_TABLET | Freq: Three times a day (TID) | ORAL | Status: DC | PRN
  Administered 2024-06-25: 500 mg via ORAL
  Filled 2024-06-25: qty 1

## 2024-06-25 MED ORDER — OXYCODONE HCL 5 MG PO TABS
5.0000 mg | ORAL_TABLET | Freq: Four times a day (QID) | ORAL | Status: DC | PRN
  Administered 2024-06-25: 5 mg via ORAL
  Filled 2024-06-25: qty 1

## 2024-06-25 MED ORDER — FENOFIBRATE 54 MG PO TABS
54.0000 mg | ORAL_TABLET | Freq: Every day | ORAL | Status: DC
  Administered 2024-06-25: 54 mg via ORAL
  Filled 2024-06-25: qty 1

## 2024-06-25 MED ORDER — PROPOFOL 10 MG/ML IV BOLUS
INTRAVENOUS | Status: DC | PRN
  Administered 2024-06-25: 30 mg via INTRAVENOUS
  Administered 2024-06-25: 70 mg via INTRAVENOUS
  Administered 2024-06-25: 30 mg via INTRAVENOUS
  Administered 2024-06-25: 150 ug/kg/min via INTRAVENOUS

## 2024-06-25 MED ORDER — DOCUSATE SODIUM 100 MG PO CAPS: 100.0000 mg | ORAL_CAPSULE | Freq: Two times a day (BID) | ORAL | Status: DC

## 2024-06-25 MED ORDER — EZETIMIBE 10 MG PO TABS
10.0000 mg | ORAL_TABLET | Freq: Every day | ORAL | Status: DC
  Administered 2024-06-25: 10 mg via ORAL
  Filled 2024-06-25: qty 1

## 2024-06-25 MED ORDER — MORPHINE SULFATE (PF) 2 MG/ML IV SOLN
1.0000 mg | Freq: Four times a day (QID) | INTRAVENOUS | Status: DC | PRN
  Administered 2024-06-25: 1 mg via INTRAVENOUS
  Filled 2024-06-25: qty 1

## 2024-06-25 MED ORDER — HEPARIN (PORCINE) 25000 UT/250ML-% IV SOLN
1200.0000 [IU]/h | INTRAVENOUS | Status: AC
  Administered 2024-06-25: 1200 [IU]/h via INTRAVENOUS
  Filled 2024-06-25: qty 250

## 2024-06-25 MED ORDER — SODIUM CHLORIDE 0.9 % IV SOLN
0.7500 ug/kg/min | INTRAVENOUS | Status: DC
  Filled 2024-06-25: qty 50

## 2024-06-25 MED ORDER — CLOPIDOGREL BISULFATE 75 MG PO TABS: 75.0000 mg | ORAL_TABLET | Freq: Every day | ORAL | Status: DC

## 2024-06-25 MED ORDER — METOPROLOL TARTRATE 25 MG PO TABS
25.0000 mg | ORAL_TABLET | Freq: Two times a day (BID) | ORAL | Status: DC
  Administered 2024-06-25: 25 mg via ORAL
  Filled 2024-06-25: qty 1

## 2024-06-25 MED ORDER — LIDOCAINE 2% (20 MG/ML) 5 ML SYRINGE
INTRAMUSCULAR | Status: DC | PRN
  Administered 2024-06-25: 80 mg via INTRAVENOUS

## 2024-06-25 MED ORDER — HEPARIN (PORCINE) 25000 UT/250ML-% IV SOLN: 1200.0000 [IU]/h | INTRAVENOUS | Status: DC

## 2024-06-25 MED ORDER — SODIUM CHLORIDE 0.9 % IV SOLN
0.7500 ug/kg/min | INTRAVENOUS | Status: DC
  Administered 2024-06-25: 0.75 ug/kg/min via INTRAVENOUS
  Filled 2024-06-25: qty 50

## 2024-06-25 MED ORDER — BUDESON-GLYCOPYRROL-FORMOTEROL 160-9-4.8 MCG/ACT IN AERO
2.0000 | INHALATION_SPRAY | Freq: Every day | RESPIRATORY_TRACT | Status: DC
Start: 2024-06-26 — End: 2024-06-25
  Filled 2024-06-25: qty 5.9

## 2024-06-25 MED ORDER — RIVAROXABAN 20 MG PO TABS: 20.0000 mg | ORAL_TABLET | Freq: Every day | ORAL | Status: DC

## 2024-06-25 MED ORDER — PROPOFOL 500 MG/50ML IV EMUL
INTRAVENOUS | Status: DC | PRN
  Administered 2024-06-25: 150 ug/kg/min via INTRAVENOUS

## 2024-06-25 NOTE — Progress Notes (Signed)
 Reviewed AVS, patient expressed understanding of medications, MD follow up reviewed.   Removed IV, Site clean, dry and intact.  Patient states all belongings brought to the hospital at time of admission are accounted for and packed to take home.  [facility] contacted to transport patient to entrance C,  where family member was waiting in vehicle to transport home.

## 2024-06-25 NOTE — Anesthesia Preprocedure Evaluation (Addendum)
 Anesthesia Evaluation  Patient identified by MRN, date of birth, ID band Patient awake    Reviewed: Allergy & Precautions, NPO status , Patient's Chart, lab work & pertinent test results, reviewed documented beta blocker date and time   Airway Mallampati: II  TM Distance: >3 FB Neck ROM: Full    Dental no notable dental hx. (+) Teeth Intact, Dental Advisory Given   Pulmonary asthma , sleep apnea and Continuous Positive Airway Pressure Ventilation , former smoker   Pulmonary exam normal breath sounds clear to auscultation       Cardiovascular hypertension, Pt. on medications and Pt. on home beta blockers + CAD, + Past MI (NSTEMI 2017) and + Cardiac Stents  Normal cardiovascular exam+ dysrhythmias Atrial Fibrillation and Supra Ventricular Tachycardia  Rhythm:Regular Rate:Normal  NM PET CT Cardiac Perfusion Study 02/21/24:   Findings are consistent with apical ischemia due to mid-dstal LAD artery stenosis. The study is intermediate risk.   LV perfusion is abnormal. There is evidence of ischemia. There is no evidence of infarction. Defect 1: There is a medium defect with moderate reduction in uptake present in the apical anterior, anteroseptal and apex location(s) that is reversible. There is normal wall motion in the defect area. Consistent with ischemia. The defect is consistent with abnormal perfusion in the distal  LAD territory.   Rest left ventricular function is normal. Rest EF: 64%. Stress left ventricular function is normal. Stress EF: 59%. End diastolic cavity size is normal. End systolic cavity size is normal.   Myocardial blood flow was computed to be 0.59ml/g/min at rest and 1.32ml/g/min at stress. Global myocardial blood flow reserve was 1.97 and was mildly abnormal. In the LAD territory flow reserve is 1.84 and in the territory of the perfusion abnormality, flow reserve is 1.20.   Coronary calcium  was present on the attenuation  correction CT images. Moderate coronary calcifications were present. Coronary calcifications were present in the left anterior descending artery and left circumflex artery distribution(s).   Electronically signed by Jerel Balding, MD    Neuro/Psych    GI/Hepatic ,GERD  Medicated and Controlled,,  Endo/Other  diabetes, Type 2, Oral Hypoglycemic Agents    Renal/GU Lab Results      Component                Value               Date                                    K                        4.2                 03/20/2024                    CREATININE               1.06                03/20/2024                GFRNONAA                 >60                 03/20/2024  Musculoskeletal  (+) Arthritis ,    Abdominal  (+) + obese  Peds  Hematology Pt on Xarelto  check last dose  Lab Results      Component                Value               Date                      WBC                      6.7                 03/20/2024                HGB                      16.2                03/20/2024                HCT                      48.7                03/20/2024                MCV                      85.9                03/20/2024                PLT                      237                 03/20/2024              Anesthesia Other Findings   Reproductive/Obstetrics                              Anesthesia Physical Anesthesia Plan  ASA: 3  Anesthesia Plan: General   Post-op Pain Management: Minimal or no pain anticipated   Induction: Intravenous  PONV Risk Score and Plan: 1 and Treatment may vary due to age or medical condition and Propofol  infusion  Airway Management Planned: Nasal Cannula  Additional Equipment: None  Intra-op Plan:   Post-operative Plan:   Informed Consent: I have reviewed the patients History and Physical, chart, labs and discussed the procedure including the risks, benefits and alternatives for the  proposed anesthesia with the patient or authorized representative who has indicated his/her understanding and acceptance.     Dental advisory given  Plan Discussed with: CRNA and Surgeon  Anesthesia Plan Comments: (PAT note written 03/22/2024 by Allison Zelenak, PA-C  )         Anesthesia Quick Evaluation

## 2024-06-25 NOTE — ED Notes (Signed)
 Pharmacy tech at bedside

## 2024-06-25 NOTE — Consult Note (Addendum)
 Consultation  Referring Provider: TRH/Patel Primary Care Physician:  Dorina Loving, PA-C Primary Gastroenterologist:  Dr. Legrand  Reason for Consultation: Cute dysphagia and a dyne aphasia  HPI: Richard Walker is a 68 y.o. male with history of coronary artery disease status post very recent PCI on 05/01/2024, currently on Plavix  and aspirin , diabetes mellitus, sleep apnea, atrial fibrillation, SVT status post previous ablation.  Patient says that he felt fine on Saturday, then about 3:30 AM on Sunday morning/yesterday he was awakened with pain in his upper chest into his neck area.  He was initially concerned that it may be his heart and took a nitroglycerin  as he had been instructed.  He says he did not have immediate relief but it seemed to help a little bit, he apparently went back to sleep for a little while then was awakened again with persistent symptoms.  He says he tried to drink some water  at home and could not with choking, and pain in the back of his throat, and esophagus.  He also had a headache yesterday morning, no fever or chills, no respiratory symptoms.  He had also been having some back and knee pain had not slept well at all.  He presented to the emergency room, and had extensive workup including CT of the head which was negative for any acute CVA.  CT angio of the chest abdomen and pelvis unrevealing, CT of the head and neck negative.  He was transferred here for MRI of the brain also negative for any acute infarct. Did receive a dose of IV Benadryl  and Solu-Medrol  in the emergency room.  Initial labs with WBC of 15.8, hemoglobin 15.7/hematocrit 48.0/MCV 83.3/lactate within normal limits BUN 18/creatinine 1.31 LFTs within normal limits Chest x-ray negative Throat culture negative for strep Respiratory panel negative/COVID-negative Troponins negative  He has been started on IV heparin , and cangrelor was also started this morning.  Cardiology has seen and recommends just  leaving on cangrelor until he can resume Plavix .  Patient says he feels a little bit better this morning, and relates that the hospitalist had come around this morning and had him do a trial of water  which he says he was able to swallow, he is also able to swallow his saliva at this point.  He tried to eat a little bit of a cracker, which was painful so he spit it out.  He is not having any chest pain, no shortness of breath, no abdominal pain. He does have history of chronic GERD and stays on Protonix  at home 40 mg daily.  Not been on any new medications antibiotics etc.  EGD October 2018 per Dr. Nikki unremarkable.   Past Medical History:  Diagnosis Date   Arthritis    DDD (degenerative disc disease), lumbar    L3/L4   Diverticulosis    Dysrhythmia    SVT s/p ablation   Dysrhythmia    PAF   GERD (gastroesophageal reflux disease)    Hyperlipidemia    Low sperm motility    Myocardial infarction (HCC)    Pre-diabetes    Sleep apnea    uses cpap    SVT (supraventricular tachycardia)     Past Surgical History:  Procedure Laterality Date   AMPUTATION Left 07/15/2018   Procedure: . REPAIR OF LEFT INDEX AND LONG FINGER BY FUSION /  REPAIR OF RING FINGER;  Surgeon: Sebastian Lenis, MD;  Location: [facility] OR;  Service: Orthopedics;  Laterality: Left;   BACK SURGERY  BLALOCK PROCEDURE     Ablation   CARDIAC CATHETERIZATION N/A 12/22/2015   Procedure: Left Heart Cath and Coronary Angiography;  Surgeon: Peter M Jordan, MD;  Location: [facility] INVASIVE CV LAB;  Service: Cardiovascular;  Laterality: N/A;   CARDIAC ELECTROPHYSIOLOGY STUDY AND ABLATION     CORONARY STENT INTERVENTION N/A 05/01/2024   Procedure: CORONARY STENT INTERVENTION;  Surgeon: Ladona Heinz, MD;  Location: [facility] INVASIVE CV LAB;  Service: Cardiovascular;  Laterality: N/A;   ELECTROPHYSIOLOGIC STUDY N/A 02/19/2016   Procedure: SVT Ablation;  Surgeon: Danelle LELON Birmingham, MD;  Location: [facility] INVASIVE CV LAB;  Service: Cardiovascular;   Laterality: N/A;   HAND SURGERY     LEFT HEART CATH AND CORONARY ANGIOGRAPHY N/A 05/01/2024   Procedure: LEFT HEART CATH AND CORONARY ANGIOGRAPHY;  Surgeon: Ladona Heinz, MD;  Location: [facility] INVASIVE CV LAB;  Service: Cardiovascular;  Laterality: N/A;   septal reconstruction  2017   TOTAL KNEE ARTHROPLASTY Left 03/27/2024   Procedure: ARTHROPLASTY, KNEE, TOTAL, LEFT;  Surgeon: Addie Cordella Hamilton, MD;  Location: [facility] OR;  Service: Orthopedics;  Laterality: Left;   TURBINATE REDUCTION Bilateral 07/2016   WISDOM TOOTH EXTRACTION      Prior to Admission medications   Medication Sig Start Date End Date Taking? Authorizing Provider  albuterol  (PROAIR  HFA) 108 (90 Base) MCG/ACT inhaler Inhale 1-2 puffs into the lungs every 6 (six) hours as needed for wheezing or shortness of breath. 10/11/23  Yes Kassie Acquanetta Bradley, MD  amoxicillin -clavulanate (AUGMENTIN ) 875-125 MG tablet Take 1 tablet by mouth 2 (two) times daily. 06/12/24  Yes Saguier, [place], PA-C  azelastine  (ASTELIN ) 0.1 % nasal spray 1-2 sprays per nostril 2 times daily as needed. Patient taking differently: Place 1-2 sprays into both nostrils daily as needed for allergies or rhinitis. 1-2 sprays per nostril 2 times daily as needed. 11/23/23  Yes Almarie Birmingham NOVAK, NP  budesonide -glycopyrrolate -formoterol  (BREZTRI  AEROSPHERE) 160-9-4.8 MCG/ACT AERO inhaler Inhale 2 puffs into the lungs in the morning and at bedtime. 05/10/24  Yes Kassie Acquanetta Bradley, MD  cholecalciferol  (VITAMIN D3) 25 MCG (1000 UNIT) tablet Take 3,000-4,000 Units by mouth daily.   Yes [provider]  clopidogrel  (PLAVIX ) 75 MG tablet Take 1 tablet (75 mg total) by mouth daily. 05/21/24  Yes Wyn Jackee VEAR Mickey., NP  docusate sodium  (COLACE) 100 MG capsule Take 1 capsule (100 mg total) by mouth 2 (two) times daily. Patient taking differently: Take 100 mg by mouth 3 (three) times a week. 05/14/24  Yes Magnant, Charles L, PA-C  ezetimibe  (ZETIA ) 10 MG tablet Take 1 tablet (10 mg total) by  mouth daily. 05/21/24  Yes Wyn Jackee VEAR Mickey., NP  fenofibrate  (TRICOR ) 145 MG tablet Take 1 tablet (145 mg total) by mouth daily. 09/14/23  Yes Saguier, Dallas, PA-C  losartan  (COZAAR ) 25 MG tablet Take 1 tablet (25 mg total) by mouth daily. 09/14/23  Yes Saguier, Dallas, PA-C  Magnesium  250 MG TABS Take 250 mg by mouth once a week.   Yes [provider]  metFORMIN  (GLUCOPHAGE ) 500 MG tablet TAKE 1 TABLET(500 MG) BY MOUTH TWICE DAILY WITH A MEAL 09/14/23  Yes Saguier, Dallas, PA-C  methocarbamol  (ROBAXIN ) 500 MG tablet Take 1 tablet (500 mg total) by mouth every 8 (eight) hours as needed for muscle spasms. 05/14/24  Yes Magnant, Carlin CROME, PA-C  metoprolol  tartrate (LOPRESSOR ) 25 MG tablet Take one tablet by mouth twice a day.  May take one additional tablet by mouth as needed for breakthrough palpitations. 05/21/24  Yes  Wyn Jackee VEAR Mickey., NP  nitroGLYCERIN  (NITROSTAT ) 0.4 MG SL tablet Place 1 tablet (0.4 mg total) under the tongue every 5 (five) minutes as needed for up to 25 days for chest pain. Do not use for 24 hours after use of Viagra  05/01/24 08/22/24 Yes Ladona Heinz, MD  oxyCODONE  (ROXICODONE ) 5 MG immediate release tablet Take 1 tablet (5 mg total) by mouth every 8 (eight) hours as needed for severe pain (pain score 7-10). 06/18/24  Yes Vernetta Lonni GRADE, MD  pantoprazole  (PROTONIX ) 40 MG tablet Take 1 tablet (40 mg total) by mouth daily. 10/21/23  Yes Saguier, Dallas, PA-C  polyethylene glycol (MIRALAX / GLYCOLAX) 17 g packet Take 17 g by mouth daily as needed for mild constipation.   Yes [provider]  rivaroxaban  (XARELTO ) 20 MG TABS tablet Take 1 tablet (20 mg total) by mouth daily with supper. 05/21/24  Yes Wyn Jackee VEAR Mickey., NP  rosuvastatin  (CRESTOR ) 10 MG tablet Take 1 tablet (10 mg total) by mouth daily. 05/21/24 05/21/25 Yes Wyn Jackee VEAR Mickey., NP  sildenafil  (VIAGRA ) 100 MG tablet Take 1 tablet (100 mg total) by mouth daily as needed for erectile dysfunction. 11/21/23   Yes Waddell Danelle ORN, MD  tirzepatide  (ZEPBOUND ) 7.5 MG/0.5ML Pen Inject 7.5 mg into the skin once a week. 06/06/24  Yes Francyne Romano, MD  Vitamin D , Ergocalciferol , (DRISDOL ) 1.25 MG (50000 UNIT) CAPS capsule Take 1 capsule (50,000 Units total) by mouth every 7 (seven) days. 03/23/24  Yes Saguier, Dallas, PA-C  gabapentin  (NEURONTIN ) 100 MG capsule Take 1 capsule (100 mg total) by mouth 2 (two) times daily. Patient not taking: Reported on 06/25/2024 04/11/24   Addie Cordella Hamilton, MD    Current Facility-Administered Medications  Medication Dose Route Frequency Provider Last Rate Last Admin   cangrelor (KENGREAL) 50 mg in sodium chloride  0.9 % 250 mL (0.2 mg/mL) infusion  0.75 mcg/kg/min Intravenous Continuous Franky Redia SAILOR, MD       heparin  ADULT infusion 100 units/mL (25000 units/250mL)  1,200 Units/hr Intravenous Continuous Patel, Pranav M, MD 12 mL/hr at 06/25/24 0700 1,200 Units/hr at 06/25/24 0700   insulin  aspart (novoLOG) injection 0-6 Units  0-6 Units Subcutaneous Q4H Franky Redia SAILOR, MD       metoprolol  tartrate (LOPRESSOR ) injection 2.5 mg  2.5 mg Intravenous Q8H Franky Redia SAILOR, MD       morphine  (PF) 2 MG/ML injection 1 mg  1 mg Intravenous Q6H PRN Kakrakandy, Arshad N, MD   1 mg at 06/25/24 9352    Allergies as of 06/24/2024 - Review Complete 06/24/2024  Allergen Reaction Noted   Dust mite extract  09/08/2021   Statins  01/08/2016   Tree extract Other (See Comments) 12/28/2017    Family History  Problem Relation Age of Onset   Stroke Mother    Heart disease Mother    High Cholesterol Mother    Sleep apnea Father    Hypertension Father    Food Allergy Son    Colon cancer Neg Hx    Stomach cancer Neg Hx    Allergic rhinitis Neg Hx    Angioedema Neg Hx    Asthma Neg Hx    Eczema Neg Hx    Immunodeficiency Neg Hx    Urticaria Neg Hx     Social History   Socioeconomic History   Marital status: Married    Spouse name: Not on file   Number of  children: 4   Years of education: Not on file  Highest education level: Associate degree: occupational, scientist, product/process development, or vocational program  Occupational History   Occupation: retired  Tobacco Use   Smoking status: Former    Current packs/day: 0.00    Average packs/day: 1 pack/day for 10.0 years (10.0 ttl pk-yrs)    Types: Cigarettes    Start date: 89    Quit date: 1992    Years since quitting: 33.8   Smokeless tobacco: Never   Tobacco comments:    Uses Hookah Pipe occasionally  Vaping Use   Vaping status: Never Used  Substance and Sexual Activity   Alcohol use: No   Drug use: No   Sexual activity: Yes  Other Topics Concern   Not on file  Social History Narrative   Marital Status: Married (Feryal)Children:  4 (3 Sons/1 Daughter)Pets:  None Living Situation: Lives with spouse and 4 childrenOrigin:  He was born in [place].Occupation: Production Designer, 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  Alcohol Use:  NoneDrug Use:  NoneDiet:  RegularExercise:  NoneHobbies:  Cards   Retired    Teacher, Early Years/pre Strain: High Risk (06/11/2024)   Overall Financial Resource Strain (CARDIA)    Difficulty of Paying Living Expenses: Hard  Food Insecurity: Food Insecurity Present (06/25/2024)   Hunger Vital Sign    Worried About Running Out of Food in the Last Year: Sometimes true    Ran Out of Food in the Last Year: Sometimes true  Transportation Needs: No Transportation Needs (06/25/2024)   PRAPARE - Administrator, Civil Service (Medical): No    Lack of Transportation (Non-Medical): No  Physical Activity: Insufficiently Active (06/11/2024)   Exercise Vital Sign    Days of Exercise per Week: 2 days    Minutes of Exercise per Session: 30 min  Stress: Stress Concern Present (06/11/2024)   [employer] of Occupational Health - Occupational Stress  Questionnaire    Feeling of Stress: To some extent  Social Connections: Socially Integrated (06/11/2024)   Social Connection and Isolation Panel    Frequency of Communication with Friends and Family: More than three times a week    Frequency of Social Gatherings with Friends and Family: Once a week    Attends Religious Services: 1 to 4 times per year    Active Member of [employer] or Organizations: Yes    Attends Banker Meetings: 1 to 4 times per year    Marital Status: Married  Catering Manager Violence: Not At Risk (06/25/2024)   Humiliation, Afraid, Rape, and Kick questionnaire    Fear of Current or Ex-Partner: No    Emotionally Abused: No    Physically Abused: No    Sexually Abused: No    Review of Systems: Pertinent positive and negative review of systems were noted in the above HPI section.  All other review of systems was otherwise negative.   Physical Exam: Vital signs in last 24 hours: Temp:  [97.4 F (36.3 C)-98.1 F (36.7 C)] 97.6 F (36.4 C) (11/03 0755) Pulse Rate:  [67-96] 67 (11/03 0755) Resp:  [14-24] 18 (11/03 0755) BP: (85-137)/(55-89) 129/89 (11/03 0755) SpO2:  [95 %-100 %] 99 % (11/03 0755) Weight:  [99.7 kg] 99.7 kg (11/03 0210) Last BM Date : 06/24/24 General:   Alert,  Well-developed, well-nourished, older male pleasant and cooperative in NAD Head:  Normocephalic and atraumatic. Eyes:  Sclera clear, no icterus.   Conjunctiva pink. Ears:  Normal auditory acuity. Nose:  No deformity, discharge,  or lesions. Mouth:  No deformity or lesions.  Oropharynx benign comment did not have good visualization of the posterior pharynx Neck:  Supple; no masses or thyromegaly. Lungs:  Clear throughout to auscultation.   No wheezes, crackles, or rhonchi.  Heart:  Regular rate and rhythm; no murmurs, clicks, rubs,  or gallops. Abdomen:  Soft,nontender, BS active,nonpalp mass or hsm.   Rectal: Not done Msk:  Symmetrical without gross deformities. . Pulses:   Normal pulses noted. Extremities:  Without clubbing or edema. Neurologic:  Alert and  oriented x4;  grossly normal neurologically. Skin:  Intact without significant lesions or rashes.. Psych:  Alert and cooperative. Normal mood and affect.  Intake/Output from previous day: 11/02 0701 - 11/03 0700 In: 1043.4 [I.V.:43.4; IV Piggyback:1000] Out: -  Intake/Output this shift: No intake/output data recorded.  Lab Results: Recent Labs    06/24/24 1052 06/25/24 0307  WBC 15.8* 12.6*  HGB 15.7 13.9  HCT 48.0 41.8  PLT 258 234   BMET Recent Labs    06/24/24 1052 06/25/24 0307  NA 136 135  K 4.5 4.2  CL 100 102  CO2 25 23  GLUCOSE 128* 137*  BUN 18 12  CREATININE 1.31* 1.06  CALCIUM  9.9 9.2   LFT Recent Labs    06/25/24 0307  PROT 6.3*  ALBUMIN 3.2*  AST 13*  ALT 12  ALKPHOS 46  BILITOT 0.7   PT/INR No results for input(s): LABPROT, INR in the last 72 hours. Hepatitis Panel No results for input(s): HEPBSAG, HCVAB, HEPAIGM, HEPBIGM in the last 72 hours.   IMPRESSION:  #67 68 year old male with history of coronary artery disease status post very recent PCI on 05/01/2024, on Plavix  and aspirin  after DES who presented to the emergency room yesterday morning after he had acute onset in the middle of the night with pain in his upper chest/neck throat esophagus.  Yesterday he says he was not able to swallow saliva well and could not swallow water  which caused choking. He initially was concerned about his heart did take 1 nitroglycerin  at home with no immediate relief but may have had some mild improvement in symptoms.  Due to persistence of the symptoms he came to the ER. He has had extensive workup as outlined above with CT of the head and MRI of the brain both negative for acute infarct CT of the chest abdomen and pelvis unrevealing, CT of the neck and head unrevealing. Labs reassuring other than WBC of 15.8 however he had received a dose of Decadron in the  ER.  He does have history of chronic GERD and is maintained on chronic PPI therapy. Has not had any associated fever or respiratory symptoms, he did have a headache yesterday which is better today.  His symptoms have improved somewhat since yesterday able to swallow his saliva and able to swallow water  though he says is uncomfortable, he was unable to swallow a cracker this morning  Etiology of his acute symptoms is not entirely clear, but concerning for some sort of an acute esophagitis inflammatory versus infectious  #2 diabetes mellitus #3.  Sleep apnea #4.  Atrial fibrillation  Plan patient advised to stay n.p.o. this morning We will proceed with EGD with Dr. Albertus this afternoon.  Procedure was discussed in detail with the patient including indications risks and benefits and he is agreeable to proceed.  This will be diagnostic only as patient did take Plavix  yesterday Will hold his heparin  for 4 hours prior  to the procedure and hold cangrelor starting at 1 PM.  Both okay to resume postprocedure IV PPI twice daily  Further GI recommendations pending findings at EGD     Amy EsterwoodPA-C  06/25/2024, 9:28 AM  Addendum: I have taken a history, reviewed the chart and examined the patient. I performed a substantive portion of this encounter, including complete performance of at least one of the key components, in conjunction with the APP. I agree with the APP's note, impression and recommendations with additional input as follows.  68 year old male known to [facility], Dr. Legrand, with history of CAD with recent PCI on aspirin  and Plavix , diabetes, sleep apnea, prior ablation for atrial fibrillation. GI history pertinent for chronic GERD and he is on daily PPI at home  We were consulted as well described above for acute onset upper abdominal discomfort/pressure and trouble swallowing.  Tried nitroglycerin  for the first time in his life sublingual and may have gotten some relief for a  few minutes enough to go back to sleep.  When he woke up symptoms worsened.  Initial evaluation included imaging of the neck, negative infectious panel and negative troponin. He was started on IV heparin  and cangrelor in the setting of his recent coronary stent placement.  He had been on Plavix  at home.  He did have an EGD 7 years ago which was unremarkable.  Agree with plan for EGD now.  Cangrelor on hold with a short half-life, Plavix  on board.  Heparin  on hold. The nature of the procedure, as well as the risks, benefits, and alternatives were carefully and thoroughly reviewed with the patient. Ample time for discussion and questions allowed. The patient understood, was satisfied, and agreed to proceed.

## 2024-06-25 NOTE — Progress Notes (Signed)
   06/25/24 0351  BiPAP/CPAP/SIPAP  BiPAP/CPAP/SIPAP Pt Type Adult  BiPAP/CPAP/SIPAP Resmed  Mask Type Full face mask  Mask Size Large  EPAP 7 cmH2O  Patient Home Machine No  Patient Home Mask No  Patient Home Tubing No  Auto Titrate No

## 2024-06-25 NOTE — Care Management Obs Status (Signed)
 MEDICARE OBSERVATION STATUS NOTIFICATION   Patient Details  Name: Richard Walker MRN: [id number] Date of Birth: April 09, 1956   Medicare Observation Status Notification Given:  Yes  Verbally reviewed observation notice with Gordy Exon telephonically at [phone].  A copy will be delivered to the patients room.   Berdina Cheever 06/25/2024, 10:05 AM

## 2024-06-25 NOTE — Consult Note (Addendum)
 Cardiology Consultation   Patient ID: Richard Walker MRN: [id number]; DOB: 04-28-1956  Admit date: 06/24/2024 Date of Consult: 06/25/2024  PCP:  Dorina Dallas RIGGERS   [facility] Providers Cardiologist:  None  Electrophysiologist:  Danelle Birmingham, MD       Patient Profile: Richard Walker is a 68 y.o. male with a hx of CAD with recent PCI, paroxysmal atrial fibrillation, type II diabetes, SVT s/p ablation who is being seen 06/25/2024 for the evaluation of antiplatelet and anticoagulation recommendations at the request of Dr. Franky.  History of Present Illness: Richard Walker woke up the morning of 11/2 around 3:30 with chest pain and throat pain. He took a SLNG and went back to sleep. He later woke up with worse throat pain and difficulty swallowing, felt that his tongue was numb. Unable to drink water , regurgitated. Felt short of breath as well, had headache. Presented to the ER. He had labs, CXR, CT head, MRI head, CT angio chest/abd/pelvis, and CT angio neck. Imaging was unremarkable for acute process, notably no epiglottix swelling, no peritonsillar or retropharyngeal fluid collection, patent airway. He was given decadron and IV benadryl  and admitted to hospitalist. He was started on IV heparin  given atrial fibrillation.  Patient has not taken medications since 11/1. I was called early AM on the patient, discussed and gave preliminary recommendations. On discussion with Dr. Franky, do not want to place NG tube as it was felt to impede workup. Unclear how long he will be strict NPO or what further workup/procedures will need to be done.  On my interview this morning, patient reports that he has tolerated a little bit of water  but cannot swallow anything else. He has been seen by GI and is planned for endoscopy later today. Cangrelor was just about to be hung but was planned to be held for endoscopy, so not started.  Patient notes he has had more fatigue since his stent procedure  but had not had pain. He did feel better after receiving IV medications overnight in the ER.  Past Medical History:  Diagnosis Date   Arthritis    DDD (degenerative disc disease), lumbar    L3/L4   Diverticulosis    Dysrhythmia    SVT s/p ablation   Dysrhythmia    PAF   GERD (gastroesophageal reflux disease)    Hyperlipidemia    Low sperm motility    Myocardial infarction (HCC)    Pre-diabetes    Sleep apnea    uses cpap    SVT (supraventricular tachycardia)     Past Surgical History:  Procedure Laterality Date   AMPUTATION Left 07/15/2018   Procedure: . REPAIR OF LEFT INDEX AND LONG FINGER BY FUSION /  REPAIR OF RING FINGER;  Surgeon: Sebastian Lenis, MD;  Location: [facility] OR;  Service: Orthopedics;  Laterality: Left;   BACK SURGERY     BLALOCK PROCEDURE     Ablation   CARDIAC CATHETERIZATION N/A 12/22/2015   Procedure: Left Heart Cath and Coronary Angiography;  Surgeon: Peter M Jordan, MD;  Location: [facility] INVASIVE CV LAB;  Service: Cardiovascular;  Laterality: N/A;   CARDIAC ELECTROPHYSIOLOGY STUDY AND ABLATION     CORONARY STENT INTERVENTION N/A 05/01/2024   Procedure: CORONARY STENT INTERVENTION;  Surgeon: Ladona Heinz, MD;  Location: [facility] INVASIVE CV LAB;  Service: Cardiovascular;  Laterality: N/A;   ELECTROPHYSIOLOGIC STUDY N/A 02/19/2016   Procedure: SVT Ablation;  Surgeon: Danelle LELON Birmingham, MD;  Location: [facility] INVASIVE CV LAB;  Service: Cardiovascular;  Laterality:  N/A;   HAND SURGERY     LEFT HEART CATH AND CORONARY ANGIOGRAPHY N/A 05/01/2024   Procedure: LEFT HEART CATH AND CORONARY ANGIOGRAPHY;  Surgeon: Ladona Heinz, MD;  Location: [facility] INVASIVE CV LAB;  Service: Cardiovascular;  Laterality: N/A;   septal reconstruction  2017   TOTAL KNEE ARTHROPLASTY Left 03/27/2024   Procedure: ARTHROPLASTY, KNEE, TOTAL, LEFT;  Surgeon: Addie Cordella Hamilton, MD;  Location: [facility] OR;  Service: Orthopedics;  Laterality: Left;   TURBINATE REDUCTION Bilateral 07/2016   WISDOM TOOTH EXTRACTION       Home  Medications:  Prior to Admission medications   Medication Sig Start Date End Date Taking? Authorizing Provider  albuterol  (PROAIR  HFA) 108 (90 Base) MCG/ACT inhaler Inhale 1-2 puffs into the lungs every 6 (six) hours as needed for wheezing or shortness of breath. 10/11/23  Yes Kassie Acquanetta Bradley, MD  amoxicillin -clavulanate (AUGMENTIN ) 875-125 MG tablet Take 1 tablet by mouth 2 (two) times daily. 06/12/24  Yes Saguier, [place], PA-C  azelastine  (ASTELIN ) 0.1 % nasal spray 1-2 sprays per nostril 2 times daily as needed. Patient taking differently: Place 1-2 sprays into both nostrils daily as needed for allergies or rhinitis. 1-2 sprays per nostril 2 times daily as needed. 11/23/23  Yes Almarie Waddell NOVAK, NP  budesonide -glycopyrrolate -formoterol  (BREZTRI  AEROSPHERE) 160-9-4.8 MCG/ACT AERO inhaler Inhale 2 puffs into the lungs in the morning and at bedtime. 05/10/24  Yes Kassie Acquanetta Bradley, MD  cholecalciferol  (VITAMIN D3) 25 MCG (1000 UNIT) tablet Take 3,000-4,000 Units by mouth daily.   Yes [provider]  clopidogrel  (PLAVIX ) 75 MG tablet Take 1 tablet (75 mg total) by mouth daily. 05/21/24  Yes Wyn Jackee VEAR Mickey., NP  docusate sodium  (COLACE) 100 MG capsule Take 1 capsule (100 mg total) by mouth 2 (two) times daily. Patient taking differently: Take 100 mg by mouth 3 (three) times a week. 05/14/24  Yes Magnant, Charles L, PA-C  ezetimibe  (ZETIA ) 10 MG tablet Take 1 tablet (10 mg total) by mouth daily. 05/21/24  Yes Wyn Jackee VEAR Mickey., NP  fenofibrate  (TRICOR ) 145 MG tablet Take 1 tablet (145 mg total) by mouth daily. 09/14/23  Yes Saguier, [place], PA-C  losartan  (COZAAR ) 25 MG tablet Take 1 tablet (25 mg total) by mouth daily. 09/14/23  Yes Saguier, [place], PA-C  Magnesium  250 MG TABS Take 250 mg by mouth once a week.   Yes [provider]  metFORMIN  (GLUCOPHAGE ) 500 MG tablet TAKE 1 TABLET(500 MG) BY MOUTH TWICE DAILY WITH A MEAL 09/14/23  Yes Saguier, [place], PA-C  methocarbamol  (ROBAXIN ) 500  MG tablet Take 1 tablet (500 mg total) by mouth every 8 (eight) hours as needed for muscle spasms. 05/14/24  Yes Magnant, Carlin CROME, PA-C  metoprolol  tartrate (LOPRESSOR ) 25 MG tablet Take one tablet by mouth twice a day.  May take one additional tablet by mouth as needed for breakthrough palpitations. 05/21/24  Yes Wyn Jackee VEAR Mickey., NP  nitroGLYCERIN  (NITROSTAT ) 0.4 MG SL tablet Place 1 tablet (0.4 mg total) under the tongue every 5 (five) minutes as needed for up to 25 days for chest pain. Do not use for 24 hours after use of Viagra  05/01/24 08/22/24 Yes Ladona Heinz, MD  oxyCODONE  (ROXICODONE ) 5 MG immediate release tablet Take 1 tablet (5 mg total) by mouth every 8 (eight) hours as needed for severe pain (pain score 7-10). 06/18/24  Yes Vernetta Lonni GRADE, MD  pantoprazole  (PROTONIX ) 40 MG tablet Take 1 tablet (40 mg total) by mouth daily. 10/21/23  Yes Saguier, [place], PA-C  polyethylene glycol (MIRALAX / GLYCOLAX) 17 g packet Take 17 g by mouth daily as needed for mild constipation.   Yes [provider]  rivaroxaban  (XARELTO ) 20 MG TABS tablet Take 1 tablet (20 mg total) by mouth daily with supper. 05/21/24  Yes Wyn Jackee VEAR Mickey., NP  rosuvastatin  (CRESTOR ) 10 MG tablet Take 1 tablet (10 mg total) by mouth daily. 05/21/24 05/21/25 Yes Wyn Jackee VEAR Mickey., NP  sildenafil  (VIAGRA ) 100 MG tablet Take 1 tablet (100 mg total) by mouth daily as needed for erectile dysfunction. 11/21/23  Yes Waddell Danelle ORN, MD  tirzepatide  (ZEPBOUND ) 7.5 MG/0.5ML Pen Inject 7.5 mg into the skin once a week. 06/06/24  Yes Francyne Romano, MD  Vitamin D , Ergocalciferol , (DRISDOL ) 1.25 MG (50000 UNIT) CAPS capsule Take 1 capsule (50,000 Units total) by mouth every 7 (seven) days. 03/23/24  Yes Saguier, [place], PA-C  gabapentin  (NEURONTIN ) 100 MG capsule Take 1 capsule (100 mg total) by mouth 2 (two) times daily. Patient not taking: Reported on 06/25/2024 04/11/24   Addie Cordella Hamilton, MD    Scheduled Meds:   insulin  aspart  0-6 Units Subcutaneous Q4H   metoprolol  tartrate  2.5 mg Intravenous Q8H   Continuous Infusions:  cangrelor (KENGREAL) 50 mg in sodium chloride  0.9 % 250 mL (0.2 mg/mL) infusion     PRN Meds: morphine  injection  Allergies:    Allergies  Allergen Reactions   Dust Mite Extract    Statins     Muscle aches   Tree Extract Other (See Comments)    Intolerance Grass    Social History:   Social History   Socioeconomic History   Marital status: Married    Spouse name: Not on file   Number of children: 4   Years of education: Not on file   Highest education level: Associate degree: occupational, scientist, product/process development, or vocational program  Occupational History   Occupation: retired  Tobacco Use   Smoking status: Former    Current packs/day: 0.00    Average packs/day: 1 pack/day for 10.0 years (10.0 ttl pk-yrs)    Types: Cigarettes    Start date: 83    Quit date: 1992    Years since quitting: 33.8   Smokeless tobacco: Never   Tobacco comments:    Uses Hookah Pipe occasionally  Vaping Use   Vaping status: Never Used  Substance and Sexual Activity   Alcohol use: No   Drug use: No   Sexual activity: Yes  Other Topics Concern   Not on file  Social History Narrative   Marital Status: Married (Feryal)Children:  4 (3 Sons/1 Daughter)Pets:  None Living Situation: Lives with spouse and 4 childrenOrigin:  He was born in [place].Occupation: Production Designer, 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  Alcohol Use:  NoneDrug Use:  NoneDiet:  RegularExercise:  NoneHobbies:  Cards   Retired    Teacher, Early Years/pre Strain: High Risk (06/11/2024)   Overall Financial Resource Strain (CARDIA)    Difficulty of Paying Living Expenses: Hard  Food Insecurity: Food Insecurity Present (06/25/2024)   Hunger Vital Sign    Worried About Running Out of Food in the Last  Year: Sometimes true    Ran Out of Food in the Last Year: Sometimes true  Transportation Needs: No Transportation Needs (06/25/2024)   PRAPARE - Administrator, Civil Service (Medical): No    Lack of Transportation (Non-Medical):  No  Physical Activity: Insufficiently Active (06/11/2024)   Exercise Vital Sign    Days of Exercise per Week: 2 days    Minutes of Exercise per Session: 30 min  Stress: Stress Concern Present (06/11/2024)   [employer] of Occupational Health - Occupational Stress Questionnaire    Feeling of Stress: To some extent  Social Connections: Socially Integrated (06/11/2024)   Social Connection and Isolation Panel    Frequency of Communication with Friends and Family: More than three times a week    Frequency of Social Gatherings with Friends and Family: Once a week    Attends Religious Services: 1 to 4 times per year    Active Member of [employer] or Organizations: Yes    Attends Banker Meetings: 1 to 4 times per year    Marital Status: Married  Catering Manager Violence: Not At Risk (06/25/2024)   Humiliation, Afraid, Rape, and Kick questionnaire    Fear of Current or Ex-Partner: No    Emotionally Abused: No    Physically Abused: No    Sexually Abused: No    Family History:    Family History  Problem Relation Age of Onset   Stroke Mother    Heart disease Mother    High Cholesterol Mother    Sleep apnea Father    Hypertension Father    Food Allergy Son    Colon cancer Neg Hx    Stomach cancer Neg Hx    Allergic rhinitis Neg Hx    Angioedema Neg Hx    Asthma Neg Hx    Eczema Neg Hx    Immunodeficiency Neg Hx    Urticaria Neg Hx      ROS:  Please see the history of present illness.  Constitutional: Negative for chills, fever, night sweats, unintentional weight loss  HENT: Negative for ear pain and hearing loss.  Positive for throat pain/inability to swallow Eyes: Negative for loss of vision and eye pain.  Respiratory:  Negative for cough, sputum, wheezing.   Cardiovascular: See HPI. Gastrointestinal: Negative for abdominal pain, melena, and hematochezia.  Genitourinary: Negative for dysuria and hematuria.  Musculoskeletal: Negative for falls and myalgias.  Skin: Negative for itching and rash.  Neurological: Negative for focal weakness, focal sensory changes and loss of consciousness.  Endo/Heme/Allergies: Does not bruise/bleed easily.   All other ROS reviewed and negative.     Physical Exam/Data: Vitals:   06/25/24 0030 06/25/24 0049 06/25/24 0210 06/25/24 0755  BP:   132/81 129/89  Pulse: 75  76 67  Resp: 18  20 18   Temp:  98 F (36.7 C) (!) 97.4 F (36.3 C) 97.6 F (36.4 C)  TempSrc:  Oral Oral Oral  SpO2: 96%  99% 99%  Weight:   99.7 kg   Height:   5' 8 (1.727 m)     Intake/Output Summary (Last 24 hours) at 06/25/2024 0951 Last data filed at 06/25/2024 0700 Gross per 24 hour  Intake 1043.43 ml  Output --  Net 1043.43 ml      06/25/2024    2:10 AM 06/12/2024   10:15 AM 06/07/2024    5:32 PM  Last 3 Weights  Weight (lbs) 219 lb 11.2 oz 225 lb 6.4 oz 220 lb  Weight (kg) 99.655 kg 102.241 kg 99.791 kg     Body mass index is 33.41 kg/m.  General:  Well nourished, well developed, in no acute distress HEENT: normal Neck: no JVD Vascular: No carotid bruits; Distal pulses 2+ bilaterally Cardiac:  normal S1, S2; RRR; no murmur  Lungs:  clear to auscultation bilaterally, no wheezing, rhonchi or rales  Abd: soft, nontender, no hepatomegaly  Ext: no edema Musculoskeletal:  No deformities, BUE and BLE strength normal and equal Skin: warm and dry  Neuro:  CNs 2-12 intact, no focal abnormalities noted Psych:  Normal affect   EKG:  The EKG was personally reviewed and demonstrates:  NSR, borderline LVH Telemetry:  Telemetry was personally reviewed and demonstrates:  SR  Relevant CV Studies: None this admission Reviewed cath report from Dr. Ladona extensively  Laboratory Data: High  Sensitivity Troponin:  No results for input(s): TROPONINIHS in the last 720 hours.   Chemistry Recent Labs  Lab 06/24/24 1052 06/25/24 0307  NA 136 135  K 4.5 4.2  CL 100 102  CO2 25 23  GLUCOSE 128* 137*  BUN 18 12  CREATININE 1.31* 1.06  CALCIUM  9.9 9.2  GFRNONAA 59* >60  ANIONGAP 11 10    Recent Labs  Lab 06/25/24 0307  PROT 6.3*  ALBUMIN 3.2*  AST 13*  ALT 12  ALKPHOS 46  BILITOT 0.7   Lipids No results for input(s): CHOL, TRIG, HDL, LABVLDL, LDLCALC, CHOLHDL in the last 168 hours.  Hematology Recent Labs  Lab 06/24/24 1052 06/25/24 0307  WBC 15.8* 12.6*  RBC 5.76 5.10  HGB 15.7 13.9  HCT 48.0 41.8  MCV 83.3 82.0  MCH 27.3 27.3  MCHC 32.7 33.3  RDW 13.8 13.7  PLT 258 234   Thyroid  No results for input(s): TSH, FREET4 in the last 168 hours.  BNPNo results for input(s): BNP, PROBNP in the last 168 hours.  DDimer No results for input(s): DDIMER in the last 168 hours.  Radiology/Studies:  MR BRAIN WO CONTRAST Result Date: 06/24/2024 EXAM: MRI BRAIN WITHOUT CONTRAST 06/24/2024 06:34:55 PM TECHNIQUE: Multiplanar multisequence MRI of the head/brain was performed without the administration of intravenous contrast. COMPARISON: Head CT 06/24/2024. CLINICAL HISTORY: Palate weakness (CN 9). FINDINGS: BRAIN AND VENTRICLES: There is no evidence of an acute infarct, intracranial hemorrhage, mass, midline shift, hydrocephalus, or extra-axial fluid collection. Mild cerebral atrophy is within normal limits for age. T2 hyperintensities in the cerebral white matter bilaterally are nonspecific but compatible with mild chronic small vessel ischemic disease. Major intracranial vascular flow voids are preserved. ORBITS: No acute abnormality. SINUSES AND MASTOIDS: No acute abnormality. BONES AND SOFT TISSUES: Normal marrow signal. No acute soft tissue abnormality. IMPRESSION: 1. No acute intracranial abnormality. 2. Mild chronic small vessel ischemic disease.  Electronically signed by: Dasie Hamburg MD 06/24/2024 06:49 PM EST RP Workstation: [id number]   CT Angio Neck W and/or Wo Contrast Result Date: 06/24/2024 EXAM: CTA Neck 06/24/2024 02:42:23 PM TECHNIQUE: CT of the neck was performed with and without the administration of 100 mL of iohexol  (OMNIPAQUE ) 350 mg/mL injection. Multiplanar 2D and/or 3D reformatted images are provided for review. Automated exposure control, iterative reconstruction, and/or weight based adjustment of the mA/kV was utilized to reduce the radiation dose to as low as reasonably achievable. Stenosis of the internal carotid arteries measured using NASCET criteria. COMPARISON: None available CLINICAL HISTORY: FINDINGS: AORTIC ARCH AND ARCH VESSELS: Aortic arch and proximal arch vessels were not included. The included portions of the brachiocephalic and subclavian arteries are patent. CERVICAL CAROTID ARTERIES: Mixed plaque about the left greater than right carotid bifurcations. No dissection, arterial injury, or hemodynamically significant stenosis by NASCET criteria. CERVICAL VERTEBRAL ARTERIES: The vertebral arteries are patent with the right being slightly dominant. Suboptimal arterial opacification, venous  contrast, and shoulder artifact limit detailed assessment of both vertebral arteries, particularly in the V1 segments, without evidence of a significant stenosis or dissection where adequately evaluated. LUNGS AND MEDIASTINUM: No apical lung mass or consolidation. SOFT TISSUES: No epiglottic swelling. No peritonsillar or retropharyngeal fluid collection. Widely patent airway. BONES: Focally advanced disc degeneration at C5-C6 with uncovertebral spurring resulting in moderate bilateral foraminal stenosis. No evidence of high grade spinal canal stenosis. IMPRESSION: 1. Suboptimal arterial assessment as described above. 2. Mild cervical carotid atherosclerosis without evidence of a significant stenosis or dissection. 3. No acute abnormality  identified in the soft tissues of the neck. Electronically signed by: Dasie Hamburg MD 06/24/2024 02:59 PM EST RP Workstation: [id number]   CT Angio Chest/Abd/Pel for Dissection W and/or Wo Contrast Result Date: 06/24/2024 EXAM: CTA CHEST, ABDOMEN AND PELVIS WITHOUT AND WITH CONTRAST 06/24/2024 02:42:23 PM TECHNIQUE: CTA of the chest was performed without and with the administration of intravenous contrast. CTA of the abdomen and pelvis was performed without and with the administration of intravenous contrast. Multiplanar reformatted images are provided for review. MIP images are provided for review. Automated exposure control, iterative reconstruction, and/or weight based adjustment of the mA/kV was utilized to reduce the radiation dose to as low as reasonably achievable. 100 mL of iohexol  (OMNIPAQUE ) 350 MG/ML injection was administered. COMPARISON: CT abdomen and pelvis 09/21/1998 and 09/16/1998. CT of the chest 09/23/2007. CLINICAL HISTORY: Acute aortic syndrome (AAS) suspected. FINDINGS: VASCULATURE: AORTA: Mild calcified atherosclerotic disease throughout the aorta. Atherosclerotic calcifications of the aorta. No abdominal aortic aneurysm. No dissection. PULMONARY ARTERIES: No pulmonary embolism with the limits of this exam. GREAT VESSELS OF AORTIC ARCH: No acute finding. No dissection. No arterial occlusion or significant stenosis. CELIAC TRUNK: No acute finding. No occlusion or significant stenosis. Duodenal artery and Gastric artery arise directly from the aorta. SUPERIOR MESENTERIC ARTERY: No acute finding. No occlusion or significant stenosis. INFERIOR MESENTERIC ARTERY: No acute finding. No occlusion or significant stenosis. RENAL ARTERIES: Accessory renal arteries bilaterally. No occlusion or significant stenosis. ILIAC ARTERIES: No acute finding. No occlusion or significant stenosis. CHEST: MEDIASTINUM: Atherosclerotic calcifications of the coronary arteries. No mediastinal lymphadenopathy. The heart  and pericardium demonstrate no acute abnormality. LUNGS AND PLEURA: Atelectatic changes in the dependent portions of both lower lobes and within the lingula. No focal consolidation or pulmonary edema. No evidence of pleural effusion or pneumothorax. THORACIC BONES AND SOFT TISSUES: No acute bone or soft tissue abnormality. ABDOMEN AND PELVIS: LIVER: The liver is unremarkable. GALLBLADDER AND BILE DUCTS: Gallbladder is unremarkable. No biliary ductal dilatation. SPLEEN: The spleen is unremarkable. PANCREAS: The pancreas is unremarkable. ADRENAL GLANDS: Bilateral adrenal glands demonstrate no acute abnormality. KIDNEYS, URETERS AND BLADDER: Bilateral renal cysts appear stable measuring up to 5.4 cm. No stones in the kidneys or ureters. No hydronephrosis. No perinephric or periureteral stranding. Urinary bladder is unremarkable. GI AND BOWEL: Stomach and duodenal sweep demonstrate no acute abnormality. The appendix appears normal. There are scattered colonic diverticula. There is no bowel obstruction. No abnormal bowel wall thickening or distension. REPRODUCTIVE: Reproductive organs are unremarkable. PERITONEUM AND RETROPERITONEUM: No ascites or free air. LYMPH NODES: No lymphadenopathy. ABDOMINAL BONES AND SOFT TISSUES: Small fat-containing inguinal hernias. Disc spacer is seen at L4-L5. Severe degenerative changes at L2-L3. No acute abnormality of the bones. No acute soft tissue abnormality. IMPRESSION: 1. No evidence of acute aortic syndrome. 2. Mild calcified atherosclerotic atherosclerosis throughout the aorta. Electronically signed by: Greig Pique MD 06/24/2024 02:55 PM EST RP Workstation: [id number]  CT Head Wo Contrast Result Date: 06/24/2024 EXAM: CT HEAD WITHOUT CONTRAST 06/24/2024 02:42:23 PM TECHNIQUE: CT of the head was performed without the administration of intravenous contrast. Automated exposure control, iterative reconstruction, and/or weight based adjustment of the mA/kV was utilized to reduce  the radiation dose to as low as reasonably achievable. COMPARISON: Head CT 10/07/2020. CLINICAL HISTORY: Headache, new onset (Age >= 51y). FINDINGS: BRAIN AND VENTRICLES: There is no evidence of an acute infarct, intracranial hemorrhage, mass, midline shift, hydrocephalus, or extra-axial fluid collection. Mild cerebral atrophy is within normal limits for age. Cerebral white matter hypodensities are similar to the prior study and nonspecific but compatible with mild chronic small vessel ischemic disease. Calcified atherosclerosis at the skull base. ORBITS: No acute abnormality. SINUSES: No acute abnormality. SOFT TISSUES AND SKULL: No acute soft tissue abnormality. No skull fracture. IMPRESSION: 1. No acute intracranial abnormality. 2. Mild chronic small vessel ischemic disease. Electronically signed by: Dasie Hamburg MD 06/24/2024 02:52 PM EST RP Workstation: [id number]   DG Chest 2 View Result Date: 06/24/2024 EXAM: 2 VIEW(S) XRAY OF THE CHEST 06/24/2024 11:03:40 AM COMPARISON: 06/12/2024 CLINICAL HISTORY: Chest pain FINDINGS: LUNGS AND PLEURA: Previously described opacity in the posterior lung bases is no longer visualized. No pleural effusion. No pneumothorax. HEART AND MEDIASTINUM: No acute abnormality of the cardiac and mediastinal silhouettes. BONES AND SOFT TISSUES: No acute osseous abnormality. IMPRESSION: 1. Previously described posterior basilar opacity has resolved. 2. No active disease. Electronically signed by: Norleen Kil MD 06/24/2024 11:10 AM EST RP Workstation: [id number]     Assessment and Plan: Odynophagia -Discussed with Dr. Franky early AM. If strict NPO, with multiple stents and distal ballooning to the LAD about 6 weeks ago, he needs to be on antiplatelet. Options are rectal clopidogrel  or IV cangrelor. Unclear etiology of his inability to swallow, unclear how long this will be. Per discussion, not planned for NG tube as this will affect the workup for his symptoms -he was started  on heparin  overnight given history of afib on xarelto . Heparin  isn't sufficient by itself to prevent stent thrombosis (hence rectal plavix  or cangrelor above). If rectal plavix  used, can continue heparin , but if he will need to be on cangrelor for an indefinite period of time, would stop heparin  -as soon as he can take oral medications and no plans for surgery/procedure that would require antiplatelet/anticoagulation hold, restart clopidogrel  and xarelto  orally -he did have throat pain prior, but reports his symptoms were worse after taking SLNG and better after receiving IV meds in ER. Question if this was the steroid/benadryl . If no other etiology found, may have had some reaction to [facility]. Doesn't fully explain initial symptoms, however  UPDATED: on arrival to room, cangrelor was just about to be started, but there was a hold given plans for endoscopy this afternoon. As last dose of clopidogrel  was 11/1, reasonable to hold on cangrelor until after endoscopy. Ideally if able, would be able to restart oral clopidogrel  post procedure and avoid cangrelor entirely  CAD with recent DES x2 to LAD and distal ballooning 05/01/24 Mixed hyperlipidemia Chest/throat pain -noted that LAD was occluded with faint microchannel right at D1, then severely diffuse disease mid to distal and distal disease, with collaterals in place. Patient underwent stenting to proximal and mid LAD with 2 DES, and he had balloon angioplasty only to distal LAD. -given length of stent and amount of intervention, he should be on antiplatelet to prevent thrombosis. Ideally would like either only brief hold (24-28 hours) or rectal  clopidogrel  as above, but given unclear etiology of his symptoms and unknown need for intervention, cangrelor is alternative, see above -ezetimibe , fenofibrate , rosuvastatin  on hold, restart when able -hsTN normal, reassuring  Paroxysmal atrial fibrillation -CHA2DS2/VAS Stroke Risk Points=4  -currently in  sinus -placed on heparin  overnight, as noted above, would stop heparin  if cangrelor needs to be continued  Hypertension: losartan  on hold, receiving PRN IV  Risk Assessment/Risk Scores:  CHA2DS2-VASc Score = 4   This indicates a 4.8% annual risk of stroke. The patient's score is based upon: CHF History: 0 HTN History: 1 Diabetes History: 1 Stroke History: 0 Vascular Disease History: 1 Age Score: 1 Gender Score: 0   For questions or updates, please contact [facility] Please consult [email or web address] for contact info under   Signed, Shelda Bruckner, MD  06/25/2024 9:51 AM

## 2024-06-25 NOTE — TOC Initial Note (Signed)
 Transition of Care [facility]) - Initial/Assessment Note    Patient Details  Name: Richard Walker MRN: [id number] Date of Birth: 06-18-1956  Transition of Care [facility]) CM/SW Contact:    Lendia Dais, LCSWA Phone Number: 06/25/2024, 10:21 AM  Clinical Narrative: Pt is from home w/ son Robby who is the pt's primary contact. Pt is independent with ADL's, drives short distance, and has family drive long distance when necessary. Pt has DME of a cane, walker, and CPAP. Pt inquired about a glucometer, CSW notified RNCM.  Pt has seen PCP Dallas Maxwell in the last year and does not have a HCPOA. Pt declined receiving information for one at the moment. Pt reports no hx of MH/SU.  Pt reports that their sons Gordy or Robby can provide transportation home when medically stable to discharge.                  Expected Discharge Plan: Home/Self Care Barriers to Discharge: Continued Medical Work up   Patient Goals and CMS Choice Patient states their goals for this hospitalization and ongoing recovery are:: Eating healthier   Choice offered to / list presented to : NA      Expected Discharge Plan and Services In-house Referral: Clinical Social Work     Living arrangements for the past 2 months: Single Family Home                                      Prior Living Arrangements/Services Living arrangements for the past 2 months: Single Family Home Lives with:: Adult Children Patient language and need for interpreter reviewed:: Yes Do you feel safe going back to the place where you live?: Yes      Need for Family Participation in Patient Care: No (Comment) Care giver support system in place?: No (comment) Current home services: DME Criminal Activity/Legal Involvement Pertinent to Current Situation/Hospitalization: Yes - Comment as needed  Activities of Daily Living   ADL Screening (condition at time of admission) Independently performs ADLs?: Yes (appropriate for developmental  age) Is the patient deaf or have difficulty hearing?: No Does the patient have difficulty seeing, even when wearing glasses/contacts?: No Does the patient have difficulty concentrating, remembering, or making decisions?: No  Permission Sought/Granted Permission sought to share information with : Family Supports Permission granted to share information with : Yes, Verbal Permission Granted  Share Information with NAME: Kamarion Zagami     Permission granted to share info w Relationship: Son  Permission granted to share info w Contact Information: [phone]  Emotional Assessment Appearance:: Appears stated age Attitude/Demeanor/Rapport: Engaged Affect (typically observed): Appropriate, Pleasant Orientation: : Oriented to Situation, Oriented to Self, Oriented to Place, Oriented to  Time Alcohol / Substance Use: Not Applicable Psych Involvement: No (comment)  Admission diagnosis:  Sore throat [J02.9] Odynophagia [R13.10] Dysphagia [R13.10] Dysphagia, unspecified type [R13.10] Chest pain, unspecified type [R07.9] Acute back pain, unspecified back location, unspecified back pain laterality [M54.9] Patient Active Problem List   Diagnosis Date Noted   Dysphagia 06/24/2024   Odynophagia 06/24/2024   Diabetes mellitus type 2 in nonobese (HCC) 06/24/2024   Use of opiates for therapeutic purposes 05/09/2024   CAD (coronary artery disease) 05/01/2024   Arthritis of left knee 04/15/2024   S/P TKR (total knee replacement), left 03/29/2024   S/P total knee arthroplasty, left 03/27/2024   Constipation 02/14/2024   Metabolic dysfunction-associated steatotic liver disease (MASLD) 01/13/2024  Abnormal food appetite 11/17/2023   Insulin  resistance 11/17/2023   Class 2 severe obesity due to excess calories with serious comorbidity and body mass index (BMI) of 35.0 to 35.9 in adult 11/03/2023   Prediabetes 11/03/2023   Degenerative disc disease, lumbar 12/09/2022   Morbid obesity (HCC)  10/26/2022   Bronchitis 01/29/2022   OSA on CPAP 02/19/2021   Bite, snake 01/28/2021   Neck pain 11/13/2020   Vertigo 11/13/2020   Hypertension 11/03/2020   Contusion of bone 10/23/2020   Degenerative arthritis of knee, bilateral 10/23/2020   Concussion with no loss of consciousness 10/23/2020   Paroxysmal atrial fibrillation (HCC) 05/22/2019   Unspecified atrial flutter (HCC) 05/22/2019   Pain in right foot 03/30/2018   Perennial and seasonal allergic rhinitis 11/09/2017   History of food allergy 11/09/2017   Bronchitis, mucopurulent recurrent (HCC) 09/07/2017   SVT (supraventricular tachycardia) 04/23/2015   Abdominal pain, epigastric 06/03/2013   Vitamin D  deficiency 04/09/2013   Esophageal reflux 04/09/2013   Hepatitis B carrier (HCC) 04/09/2013   Other and unspecified hyperlipidemia 04/09/2013   Sleep apnea 04/09/2013   PCP:  Dorina Loving, PA-C Pharmacy:   [facility] [place] [facility] [address] [place] [address] Phone: [phone] Fax: [phone]  [id number] - [place], [place] - [address] AT [facility] OF [address] [place] [address] Phone: [phone] Fax: [phone]     Social Drivers of Health (SDOH) Social History: SDOH Screenings   Food Insecurity: Food Insecurity Present (06/25/2024)  Housing: High Risk (06/25/2024)  Transportation Needs: No Transportation Needs (06/25/2024)  Utilities: Not At Risk (06/25/2024)  Alcohol Screen: Low Risk  (01/17/2024)  Depression (PHQ2-9): Medium Risk (06/12/2024)  Financial Resource Strain: High Risk (06/11/2024)  Physical Activity: Insufficiently Active (06/11/2024)  Social Connections: Socially Integrated (06/11/2024)  Stress: Stress Concern Present (06/11/2024)  Tobacco Use: Medium Risk (06/24/2024)  Health Literacy: Adequate Health Literacy (01/18/2024)   SDOH Interventions:     Readmission Risk  Interventions     No data to display

## 2024-06-25 NOTE — ED Notes (Signed)
 Per MD, no nightly insulin  coverage unless CBG >200.

## 2024-06-25 NOTE — Progress Notes (Signed)
 Dr. Franky made aware that pt is now able to swallow his saliva and his throat pain is significantly decreased.  He asked that I give him a trial of water  and pt easily swallowed without issues. Glade Lee BSN RN CMSRN 06/25/2024, 7:10 AM

## 2024-06-25 NOTE — Progress Notes (Signed)
 PHARMACY - ANTICOAGULATION CONSULT NOTE  Pharmacy Consult for Heparin  (Holding Xarelto ) Indication: atrial fibrillation  Allergies  Allergen Reactions   Dust Mite Extract    Statins     Muscle aches   Tree Extract Other (See Comments)    Intolerance Grass    Patient Measurements: Height: 5' 8 (172.7 cm) Weight: 99.7 kg (219 lb 11.2 oz) IBW/kg (Calculated) : 68.4 HEPARIN  DW (KG): 89.7  Vital Signs: Temp: 97.4 F (36.3 C) (11/03 0210) Temp Source: Oral (11/03 0210) BP: 132/81 (11/03 0210) Pulse Rate: 76 (11/03 0210)  Labs: Recent Labs    06/24/24 1052  HGB 15.7  HCT 48.0  PLT 258  CREATININE 1.31*  CKTOTAL 25*    Estimated Creatinine Clearance: 61.8 mL/min (A) (by C-G formula based on SCr of 1.31 mg/dL (H)).   Medical History: Past Medical History:  Diagnosis Date   Arthritis    DDD (degenerative disc disease), lumbar    L3/L4   Diverticulosis    Dysrhythmia    SVT s/p ablation   Dysrhythmia    PAF   GERD (gastroesophageal reflux disease)    Hyperlipidemia    Low sperm motility    Myocardial infarction (HCC)    Pre-diabetes    Sleep apnea    uses cpap    SVT (supraventricular tachycardia)    Assessment: 68 y/o M presents to the ED with chest pain and sore throat. On Xarelto  PTA for afib, holding Xarelto  and starting heparin  for now. Last dose of Xarelto  was on 11/1, ok to start heparin  now. Anticipate using aPTT to dose. Labs above reviewed.   Goal of Therapy:  Heparin  level 0.3-0.7 units/ml aPTT 66-102 seconds Monitor platelets by anticoagulation protocol: Yes   Plan:  Start heparin  at 1200 units/hr Heparin  level and aPTT in 8 hours Daily CBC, heparin  level, and aPTT  Monitor for bleeding   Lynwood Mckusick, PharmD, BCPS Clinical Pharmacist Phone: [phone]

## 2024-06-25 NOTE — TOC Transition Note (Signed)
 Transition of Care [facility]) - Discharge Note   Patient Details  Name: Richard Walker MRN: [id number] Date of Birth: August 12, 1956  Transition of Care [facility]) CM/SW Contact:  Tom-Johnson, Mohan Erven Daphne, RN Phone Number: 06/25/2024, 3:12 PM   Clinical Narrative:     Patient is scheduled for discharge today.  Outpatient f/u, hospital f/u and discharge instructions on AVS. No ICM needs or recommendations noted. Family to transport at discharge.  No further ICM needs noted.       Final next level of care: Home/Self Care Barriers to Discharge: Barriers Resolved   Patient Goals and CMS Choice Patient states their goals for this hospitalization and ongoing recovery are:: Eating healthier   Choice offered to / list presented to : NA      Discharge Placement                Patient to be transferred to facility by: family      Discharge Plan and Services Additional resources added to the After Visit Summary for   In-house Referral: Clinical Social Work              DME Arranged: N/A DME Agency: NA         HH Agency: NA        Social Drivers of Health (SDOH) Interventions SDOH Screenings   Food Insecurity: Food Insecurity Present (06/25/2024)  Housing: High Risk (06/25/2024)  Transportation Needs: No Transportation Needs (06/25/2024)  Utilities: Not At Risk (06/25/2024)  Alcohol Screen: Low Risk  (01/17/2024)  Depression (PHQ2-9): Medium Risk (06/12/2024)  Financial Resource Strain: High Risk (06/11/2024)  Physical Activity: Insufficiently Active (06/11/2024)  Social Connections: Unknown (06/25/2024)  Stress: Stress Concern Present (06/11/2024)  Tobacco Use: Medium Risk (06/25/2024)  Health Literacy: Adequate Health Literacy (01/18/2024)     Readmission Risk Interventions     No data to display

## 2024-06-25 NOTE — Op Note (Signed)
 [facility] Patient Name: Richard Walker Procedure Date : 06/25/2024 MRN: [id number] Attending MD: Gordy CHRISTELLA Starch , MD, [id number] Date of Birth: 05-Nov-1955 CSN: [id number] Age: 68 Admit Type: Inpatient Procedure:                Upper GI endoscopy Indications:              Dysphagia, Odynophagia, Unexplained chest pain, hx                            of GERD on pantoprazole  40 mg daily Providers:                Gordy CHRISTELLA. Starch, MD, Willy Hummer, RN, Farris Southgate, Technician Referring MD:             [facility] Medicines:                Monitored Anesthesia Care Complications:            No immediate complications. Estimated Blood Loss:     Estimated blood loss: none. Procedure:                Pre-Anesthesia Assessment:                           - Prior to the procedure, a History and Physical                            was performed, and patient medications and                            allergies were reviewed. The patient's tolerance of                            previous anesthesia was also reviewed. The risks                            and benefits of the procedure and the sedation                            options and risks were discussed with the patient.                            All questions were answered, and informed consent                            was obtained. Prior Anticoagulants: The patient has                            taken Plavix  (clopidogrel ), last dose was 1 day                            prior to procedure. ASA Grade Assessment: III - A  patient with severe systemic disease. After                            reviewing the risks and benefits, the patient was                            deemed in satisfactory condition to undergo the                            procedure.                           After obtaining informed consent, the endoscope was                            passed  under direct vision. Throughout the                            procedure, the patient's blood pressure, pulse, and                            oxygen saturations were monitored continuously. The                            [id number]-[id number] ([id number]) Olympus endoscope was introduced                            through the mouth, and advanced to the second part                            of duodenum. The upper GI endoscopy was                            accomplished without difficulty. The patient                            tolerated the procedure well. Scope In: Scope Out: Findings:      The examined esophagus was normal.      The entire examined stomach was normal.      The examined duodenum was normal. Impression:               - Normal esophagus.                           - Normal stomach.                           - Normal examined duodenum.                           - No specimens collected. Moderate Sedation:      N/A Recommendation:           - Return patient to hospital ward for ongoing care.                           -  Advance diet as tolerated.                           - Continue present medications.                           - Okay to resume anticoagulations and antiplatelet                            medication per primary team and/or cardiology. Procedure Code(s):        --- Professional ---                           [phone], Esophagogastroduodenoscopy, flexible,                            transoral; diagnostic, including collection of                            specimen(s) by brushing or washing, when performed                            (separate procedure) Diagnosis Code(s):        --- Professional ---                           R13.10, Dysphagia, unspecified                           R07.9, Chest pain, unspecified CPT copyright 2022 American Medical Association. All rights reserved. The codes documented in this report are preliminary and upon coder review may  be revised to meet  current compliance requirements. Gordy CHRISTELLA Starch, MD 06/25/2024 12:41:59 PM This report has been signed electronically. Number of Addenda: 0

## 2024-06-25 NOTE — Transfer of Care (Signed)
 Immediate Anesthesia Transfer of Care Note  Patient: Richard Walker  Procedure(s) Performed: EGD (ESOPHAGOGASTRODUODENOSCOPY)  Patient Location: PACU and Endoscopy Unit  Anesthesia Type:MAC  Level of Consciousness: drowsy  Airway & Oxygen Therapy: Patient Spontanous Breathing and Patient connected to nasal cannula oxygen  Post-op Assessment: Report given to RN and Post -op Vital signs reviewed and stable  Post vital signs: Reviewed and stable  Last Vitals:  Vitals Value Taken Time  BP 105/79 06/25/24 12:45  Temp    Pulse 89 06/25/24 12:45  Resp 25 06/25/24 12:45  SpO2 96 % 06/25/24 12:45  Vitals shown include unfiled device data.  Last Pain:  Vitals:   06/25/24 1245  TempSrc:   PainSc: (P) Asleep      Patients Stated Pain Goal: 5 (06/25/24 0210)  Complications: No notable events documented.

## 2024-06-25 NOTE — ED Notes (Signed)
 Per previous nurse, pt failed second swallow screen. No PO meds to be given at this time. MD aware and at bedside

## 2024-06-25 NOTE — Progress Notes (Deleted)
 PHARMACY - ANTICOAGULATION CONSULT NOTE  Pharmacy Consult for Heparin  (Holding Xarelto ) Indication: atrial fibrillation  Allergies  Allergen Reactions   Dust Mite Extract    Statins     Muscle aches   Tree Extract Other (See Comments)    Intolerance Grass    Patient Measurements: Height: 5' 8 (172.7 cm) Weight: 99.7 kg (219 lb 11.2 oz) IBW/kg (Calculated) : 68.4 HEPARIN  DW (KG): 89.7  Vital Signs: Temp: 97.5 F (36.4 C) (11/03 1245) Temp Source: Temporal (11/03 1151) BP: 110/88 (11/03 1255) Pulse Rate: 90 (11/03 1255)  Labs: Recent Labs    06/24/24 1052 06/25/24 0307  HGB 15.7 13.9  HCT 48.0 41.8  PLT 258 234  CREATININE 1.31* 1.06  CKTOTAL 25*  --     Estimated Creatinine Clearance: 76.3 mL/min (by C-G formula based on SCr of 1.06 mg/dL).   Medical History: Past Medical History:  Diagnosis Date   Arthritis    DDD (degenerative disc disease), lumbar    L3/L4   Diverticulosis    Dysrhythmia    SVT s/p ablation   Dysrhythmia    PAF   GERD (gastroesophageal reflux disease)    Hyperlipidemia    Low sperm motility    Myocardial infarction (HCC)    Pre-diabetes    Sleep apnea    uses cpap    SVT (supraventricular tachycardia)    Assessment: 68 y/o M presents to the ED with chest pain and sore throat. On Xarelto  PTA for afib, holding Xarelto  and starting heparin  for now. Last dose of Xarelto  was on 11/1, ok to start heparin  now. Anticipate using aPTT to dose.  Resuming heparin  now post EGD  Goal of Therapy:  Heparin  level 0.3-0.7 units/ml aPTT 66-102 seconds Monitor platelets by anticoagulation protocol: Yes   Plan:  Heparin  at 1200 units / hr Heparin  level and aPTT in 8 hours Daily CBC, heparin  level, and aPTT  Monitor for bleeding   Thank you Olam Monte, PHarmD

## 2024-06-26 ENCOUNTER — Encounter (HOSPITAL_COMMUNITY): Payer: Self-pay | Admitting: Internal Medicine

## 2024-06-26 ENCOUNTER — Encounter: Payer: Medicare (Managed Care) | Admitting: Rehabilitative and Restorative Service Providers"

## 2024-06-26 ENCOUNTER — Telehealth: Payer: Self-pay

## 2024-06-26 NOTE — Anesthesia Postprocedure Evaluation (Signed)
 Anesthesia Post Note  Patient: Richard Walker  Procedure(s) Performed: EGD (ESOPHAGOGASTRODUODENOSCOPY)     Patient location during evaluation: PACU Anesthesia Type: General Level of consciousness: awake and alert Pain management: pain level controlled Vital Signs Assessment: post-procedure vital signs reviewed and stable Respiratory status: spontaneous breathing, nonlabored ventilation and respiratory function stable Cardiovascular status: blood pressure returned to baseline and stable Postop Assessment: no apparent nausea or vomiting Anesthetic complications: no   No notable events documented.  Last Vitals:  Vitals:   06/25/24 1255 06/25/24 1440  BP: 110/88 110/88  Pulse: 90 90  Resp: 19   Temp:    SpO2: 95%     Last Pain:  Vitals:   06/25/24 1500  TempSrc:   PainSc: 1                  Butler Levander Pinal

## 2024-06-26 NOTE — Discharge Summary (Signed)
 Physician Discharge Summary   Patient: Richard Walker MRN: [id number] DOB: [birth year]  Admit date:     06/24/2024  Discharge date: 06/25/2024  Discharge Physician: Yetta Blanch  PCP: Dorina Loving, PA-C  Recommendations at discharge: Follow-up with PCP as recommended.   Follow-up Information     Saguier, Edward, PA-C. Schedule an appointment as soon as possible for a visit in 1 week(s).   Specialties: Internal Medicine, Family Medicine Contact information: [address] [place] [address] [phone]                Hospital Course: : Richard Walker is a 68 y.o. male with history of CAD status post PCI on May 01, 2024, diabetes mellitus type 2, sleep apnea, atrial fibrillation, SVT status post ablation presents to the ER with complaints of pain and difficulty swallowing since waking up this morning.   Assessment and plan.  Odynophagia/dysphagia  Etiology not clear. Suspect that this occurred after patient took nitroglycerin . Patient actually reports that his tongue was swollen and he could not feel anything on his tongue and speech clearly. Suspect either localized reaction or a systemic reaction to nitroglycerin . For now would recommend to avoid nitroglycerin  going forward. GI was consulted. Underwent EGD. Completely normal. Patient was able to swallow diet.  CAD status post recent PCI  on May 01, 2024. Cardiology was consulted. Patient is back on his home medication. Discontinue nitroglycerin .  Paroxysmal atrial fibrillation  takes Xarelto , continue.  History of SVT status post ablation. Sleep apnea on CPAP at bedtime. Diabetes mellitus type 2 takes Zepbound  and metformin  last hemoglobin A1c was 5.4 two  weeks ago.  Resume home medication. History of chronic knee pain on oxycodone  confirmed on PDMP website presently on morphine . Obesity.Body mass index is 33.41 kg/m.  Placing the patient already has a work note.  Consultants:   Cardiology GI  Procedures performed:  EGD  DISCHARGE MEDICATION: Allergies as of 06/25/2024       Reactions   Nitroglycerin  Other (See Comments)   Tongue swelling and difficulty swallowing. Hospitalized and underwent EGD in 06/2024   Dust Mite Extract    Statins    Muscle aches   Tree Extract Other (See Comments)   Intolerance Grass        Medication List     STOP taking these medications    nitroGLYCERIN  0.4 MG SL tablet Commonly known as: NITROSTAT        TAKE these medications    albuterol  108 (90 Base) MCG/ACT inhaler Commonly known as: ProAir  HFA Inhale 1-2 puffs into the lungs every 6 (six) hours as needed for wheezing or shortness of breath.   amoxicillin -clavulanate 875-125 MG tablet Commonly known as: AUGMENTIN  Take 1 tablet by mouth 2 (two) times daily.   azelastine  0.1 % nasal spray Commonly known as: ASTELIN  1-2 sprays per nostril 2 times daily as needed. What changed:  how much to take how to take this when to take this reasons to take this   Breztri  Aerosphere 160-9-4.8 MCG/ACT Aero inhaler Generic drug: budesonide -glycopyrrolate -formoterol  Inhale 2 puffs into the lungs in the morning and at bedtime.   cholecalciferol  25 MCG (1000 UNIT) tablet Commonly known as: VITAMIN D3 Take 3,000-4,000 Units by mouth daily.   clopidogrel  75 MG tablet Commonly known as: Plavix  Take 1 tablet (75 mg total) by mouth daily.   docusate sodium  100 MG capsule Commonly known as: Colace Take 1 capsule (100 mg total) by mouth 2 (two) times daily. What  changed: when to take this   ezetimibe  10 MG tablet Commonly known as: Zetia  Take 1 tablet (10 mg total) by mouth daily.   fenofibrate  145 MG tablet Commonly known as: TRICOR  Take 1 tablet (145 mg total) by mouth daily.   gabapentin  100 MG capsule Commonly known as: NEURONTIN  Take 1 capsule (100 mg total) by mouth 2 (two) times daily.   losartan  25 MG tablet Commonly known as: COZAAR  Take 1 tablet  (25 mg total) by mouth daily.   Magnesium  250 MG Tabs Take 250 mg by mouth once a week.   metFORMIN  500 MG tablet Commonly known as: GLUCOPHAGE  TAKE 1 TABLET(500 MG) BY MOUTH TWICE DAILY WITH A MEAL   methocarbamol  500 MG tablet Commonly known as: ROBAXIN  Take 1 tablet (500 mg total) by mouth every 8 (eight) hours as needed for muscle spasms.   metoprolol  tartrate 25 MG tablet Commonly known as: LOPRESSOR  Take one tablet by mouth twice a day.  May take one additional tablet by mouth as needed for breakthrough palpitations.   oxyCODONE  5 MG immediate release tablet Commonly known as: Roxicodone  Take 1 tablet (5 mg total) by mouth every 8 (eight) hours as needed for severe pain (pain score 7-10).   pantoprazole  40 MG tablet Commonly known as: PROTONIX  Take 1 tablet (40 mg total) by mouth daily.   polyethylene glycol 17 g packet Commonly known as: MIRALAX / GLYCOLAX Take 17 g by mouth daily as needed for mild constipation.   rosuvastatin  10 MG tablet Commonly known as: Crestor  Take 1 tablet (10 mg total) by mouth daily.   sildenafil  100 MG tablet Commonly known as: VIAGRA  Take 1 tablet (100 mg total) by mouth daily as needed for erectile dysfunction.   Vitamin D  (Ergocalciferol ) 1.25 MG (50000 UNIT) Caps capsule Commonly known as: DRISDOL  Take 1 capsule (50,000 Units total) by mouth every 7 (seven) days.   Xarelto  20 MG Tabs tablet Generic drug: rivaroxaban  Take 1 tablet (20 mg total) by mouth daily with supper.   Zepbound  7.5 MG/0.5ML Pen Generic drug: tirzepatide  Inject 7.5 mg into the skin once a week.       Disposition: Home Diet recommendation: Cardiac diet  Discharge Exam: Vitals:   06/25/24 1245 06/25/24 1250 06/25/24 1255 06/25/24 1440  BP: 105/79 106/69 110/88 110/88  Pulse: 86 96 90 90  Resp: (!) 23 (!) 24 19   Temp: (!) 97.5 F (36.4 C)     TempSrc:      SpO2: 96% 95% 95%   Weight:      Height:       General: in Mild distress, No  Rash Cardiovascular: S1 and S2 Present, No Murmur Respiratory: Good respiratory effort, Bilateral Air entry present. No Crackles, No wheezes Abdomen: Bowel Sound present, No tenderness Extremities: No edema Neuro: Alert and oriented x3, no new focal deficit   Filed Weights   06/25/24 0210  Weight: 99.7 kg   Condition at discharge: stable  The results of significant diagnostics from this hospitalization (including imaging, microbiology, ancillary and laboratory) are listed below for reference.   Imaging Studies: MR BRAIN WO CONTRAST Result Date: 06/24/2024 EXAM: MRI BRAIN WITHOUT CONTRAST 06/24/2024 06:34:55 PM TECHNIQUE: Multiplanar multisequence MRI of the head/brain was performed without the administration of intravenous contrast. COMPARISON: Head CT 06/24/2024. CLINICAL HISTORY: Palate weakness (CN 9). FINDINGS: BRAIN AND VENTRICLES: There is no evidence of an acute infarct, intracranial hemorrhage, mass, midline shift, hydrocephalus, or extra-axial fluid collection. Mild cerebral atrophy is within normal limits  for age. T2 hyperintensities in the cerebral white matter bilaterally are nonspecific but compatible with mild chronic small vessel ischemic disease. Major intracranial vascular flow voids are preserved. ORBITS: No acute abnormality. SINUSES AND MASTOIDS: No acute abnormality. BONES AND SOFT TISSUES: Normal marrow signal. No acute soft tissue abnormality. IMPRESSION: 1. No acute intracranial abnormality. 2. Mild chronic small vessel ischemic disease. Electronically signed by: Dasie Hamburg MD 06/24/2024 06:49 PM EST RP Workstation: [id number]   CT Angio Neck W and/or Wo Contrast Result Date: 06/24/2024 EXAM: CTA Neck 06/24/2024 02:42:23 PM TECHNIQUE: CT of the neck was performed with and without the administration of 100 mL of iohexol  (OMNIPAQUE ) 350 mg/mL injection. Multiplanar 2D and/or 3D reformatted images are provided for review. Automated exposure control, iterative reconstruction,  and/or weight based adjustment of the mA/kV was utilized to reduce the radiation dose to as low as reasonably achievable. Stenosis of the internal carotid arteries measured using NASCET criteria. COMPARISON: None available CLINICAL HISTORY: FINDINGS: AORTIC ARCH AND ARCH VESSELS: Aortic arch and proximal arch vessels were not included. The included portions of the brachiocephalic and subclavian arteries are patent. CERVICAL CAROTID ARTERIES: Mixed plaque about the left greater than right carotid bifurcations. No dissection, arterial injury, or hemodynamically significant stenosis by NASCET criteria. CERVICAL VERTEBRAL ARTERIES: The vertebral arteries are patent with the right being slightly dominant. Suboptimal arterial opacification, venous contrast, and shoulder artifact limit detailed assessment of both vertebral arteries, particularly in the V1 segments, without evidence of a significant stenosis or dissection where adequately evaluated. LUNGS AND MEDIASTINUM: No apical lung mass or consolidation. SOFT TISSUES: No epiglottic swelling. No peritonsillar or retropharyngeal fluid collection. Widely patent airway. BONES: Focally advanced disc degeneration at C5-C6 with uncovertebral spurring resulting in moderate bilateral foraminal stenosis. No evidence of high grade spinal canal stenosis. IMPRESSION: 1. Suboptimal arterial assessment as described above. 2. Mild cervical carotid atherosclerosis without evidence of a significant stenosis or dissection. 3. No acute abnormality identified in the soft tissues of the neck. Electronically signed by: Dasie Hamburg MD 06/24/2024 02:59 PM EST RP Workstation: [id number]   CT Angio Chest/Abd/Pel for Dissection W and/or Wo Contrast Result Date: 06/24/2024 EXAM: CTA CHEST, ABDOMEN AND PELVIS WITHOUT AND WITH CONTRAST 06/24/2024 02:42:23 PM TECHNIQUE: CTA of the chest was performed without and with the administration of intravenous contrast. CTA of the abdomen and pelvis was  performed without and with the administration of intravenous contrast. Multiplanar reformatted images are provided for review. MIP images are provided for review. Automated exposure control, iterative reconstruction, and/or weight based adjustment of the mA/kV was utilized to reduce the radiation dose to as low as reasonably achievable. 100 mL of iohexol  (OMNIPAQUE ) 350 MG/ML injection was administered. COMPARISON: CT abdomen and pelvis 09/21/1998 and 09/16/1998. CT of the chest 09/23/2007. CLINICAL HISTORY: Acute aortic syndrome (AAS) suspected. FINDINGS: VASCULATURE: AORTA: Mild calcified atherosclerotic disease throughout the aorta. Atherosclerotic calcifications of the aorta. No abdominal aortic aneurysm. No dissection. PULMONARY ARTERIES: No pulmonary embolism with the limits of this exam. GREAT VESSELS OF AORTIC ARCH: No acute finding. No dissection. No arterial occlusion or significant stenosis. CELIAC TRUNK: No acute finding. No occlusion or significant stenosis. Duodenal artery and Gastric artery arise directly from the aorta. SUPERIOR MESENTERIC ARTERY: No acute finding. No occlusion or significant stenosis. INFERIOR MESENTERIC ARTERY: No acute finding. No occlusion or significant stenosis. RENAL ARTERIES: Accessory renal arteries bilaterally. No occlusion or significant stenosis. ILIAC ARTERIES: No acute finding. No occlusion or significant stenosis. CHEST: MEDIASTINUM: Atherosclerotic calcifications of the coronary arteries.  No mediastinal lymphadenopathy. The heart and pericardium demonstrate no acute abnormality. LUNGS AND PLEURA: Atelectatic changes in the dependent portions of both lower lobes and within the lingula. No focal consolidation or pulmonary edema. No evidence of pleural effusion or pneumothorax. THORACIC BONES AND SOFT TISSUES: No acute bone or soft tissue abnormality. ABDOMEN AND PELVIS: LIVER: The liver is unremarkable. GALLBLADDER AND BILE DUCTS: Gallbladder is unremarkable. No biliary  ductal dilatation. SPLEEN: The spleen is unremarkable. PANCREAS: The pancreas is unremarkable. ADRENAL GLANDS: Bilateral adrenal glands demonstrate no acute abnormality. KIDNEYS, URETERS AND BLADDER: Bilateral renal cysts appear stable measuring up to 5.4 cm. No stones in the kidneys or ureters. No hydronephrosis. No perinephric or periureteral stranding. Urinary bladder is unremarkable. GI AND BOWEL: Stomach and duodenal sweep demonstrate no acute abnormality. The appendix appears normal. There are scattered colonic diverticula. There is no bowel obstruction. No abnormal bowel wall thickening or distension. REPRODUCTIVE: Reproductive organs are unremarkable. PERITONEUM AND RETROPERITONEUM: No ascites or free air. LYMPH NODES: No lymphadenopathy. ABDOMINAL BONES AND SOFT TISSUES: Small fat-containing inguinal hernias. Disc spacer is seen at L4-L5. Severe degenerative changes at L2-L3. No acute abnormality of the bones. No acute soft tissue abnormality. IMPRESSION: 1. No evidence of acute aortic syndrome. 2. Mild calcified atherosclerotic atherosclerosis throughout the aorta. Electronically signed by: Greig Pique MD 06/24/2024 02:55 PM EST RP Workstation: [id number]   CT Head Wo Contrast Result Date: 06/24/2024 EXAM: CT HEAD WITHOUT CONTRAST 06/24/2024 02:42:23 PM TECHNIQUE: CT of the head was performed without the administration of intravenous contrast. Automated exposure control, iterative reconstruction, and/or weight based adjustment of the mA/kV was utilized to reduce the radiation dose to as low as reasonably achievable. COMPARISON: Head CT 10/07/2020. CLINICAL HISTORY: Headache, new onset (Age >= 51y). FINDINGS: BRAIN AND VENTRICLES: There is no evidence of an acute infarct, intracranial hemorrhage, mass, midline shift, hydrocephalus, or extra-axial fluid collection. Mild cerebral atrophy is within normal limits for age. Cerebral white matter hypodensities are similar to the prior study and nonspecific but  compatible with mild chronic small vessel ischemic disease. Calcified atherosclerosis at the skull base. ORBITS: No acute abnormality. SINUSES: No acute abnormality. SOFT TISSUES AND SKULL: No acute soft tissue abnormality. No skull fracture. IMPRESSION: 1. No acute intracranial abnormality. 2. Mild chronic small vessel ischemic disease. Electronically signed by: Dasie Hamburg MD 06/24/2024 02:52 PM EST RP Workstation: [id number]   DG Chest 2 View Result Date: 06/24/2024 EXAM: 2 VIEW(S) XRAY OF THE CHEST 06/24/2024 11:03:40 AM COMPARISON: 06/12/2024 CLINICAL HISTORY: Chest pain FINDINGS: LUNGS AND PLEURA: Previously described opacity in the posterior lung bases is no longer visualized. No pleural effusion. No pneumothorax. HEART AND MEDIASTINUM: No acute abnormality of the cardiac and mediastinal silhouettes. BONES AND SOFT TISSUES: No acute osseous abnormality. IMPRESSION: 1. Previously described posterior basilar opacity has resolved. 2. No active disease. Electronically signed by: Norleen Kil MD 06/24/2024 11:10 AM EST RP Workstation: [id number]   DG Chest 2 View Result Date: 06/12/2024 CLINICAL DATA:  intermittent dyspnea on exertion for months. EXAM: CHEST - 2 VIEW COMPARISON:  11/11/2023 FINDINGS: Patchy airspace opacity in the posterior lung base on the lateral radiograph. No pleural effusion or pneumothorax. No cardiomegaly. Tortuous aorta. No acute fracture or destructive lesions. Multilevel thoracic osteophytosis. IMPRESSION: Patchy airspace opacity in the posterior lung base on the lateral radiograph, worrisome for bronchopneumonia, in the correct clinical context. No parapneumonic effusion. A 2 view chest radiograph in 6-12 weeks is recommended to document resolution once treatment is complete. Electronically Signed   By:  Rogelia Myers M.D.   On: 06/12/2024 14:04   CT Renal Stone Study Result Date: 06/07/2024 EXAM: CT UROGRAM 06/07/2024 08:47:15 PM TECHNIQUE: CT of the abdomen and pelvis was  performed without intravenous contrast as per CT urogram protocol. Multiplanar reformatted images as well as MIP urogram images are provided for review. Automated exposure control, iterative reconstruction, and/or weight based adjustment of the mA/kV was utilized to reduce the radiation dose to as low as reasonably achievable. COMPARISON: CT abdomen and pelvis 1325. CLINICAL HISTORY: LLQ pain, L flank pain. FINDINGS: LOWER CHEST: No acute abnormality. LIVER: The liver is unremarkable. GALLBLADDER AND BILE DUCTS: Gallbladder is unremarkable. No biliary ductal dilatation. SPLEEN: No acute abnormality. PANCREAS: No acute abnormality. ADRENAL GLANDS: No acute abnormality. KIDNEYS, URETERS AND BLADDER: Bilateral renal cysts are again seen. The largest is on the left measuring 5.7 cm. No stones in the kidneys or ureters. No hydronephrosis. No perinephric or periureteral stranding. Urinary bladder is unremarkable. GI AND BOWEL: Oval fat attenuation area with surrounding inflammation is seen adjacent to the distal descending colon. There is sigmoid colon diverticulosis. The appendix appears normal. Stomach demonstrates no acute abnormality. There is no bowel obstruction. PERITONEUM AND RETROPERITONEUM: No ascites. No free air. VASCULATURE: Aorta is normal in caliber. There are atherosclerotic calcifications of the aorta. LYMPH NODES: No lymphadenopathy. REPRODUCTIVE ORGANS: No acute abnormality. BONES AND SOFT TISSUES: Disc spacer is seen at L4-L5. There are degenerative changes at L2-L3. There are small fat-containing bilateral inguinal hernias. No acute osseous abnormality. No focal soft tissue abnormality. IMPRESSION: 1. Distal descending colon  epiploic appendagitis. 2. Sigmoid colon diverticulosis without evidence of diverticulitis. Electronically signed by: Greig Pique MD 06/07/2024 09:04 PM EDT RP Workstation: [id number]   DG Foot Complete Left Result Date: 05/31/2024 Please see detailed radiograph report in  office note.  DG Foot Complete Right Result Date: 05/31/2024 Please see detailed radiograph report in office note.   Microbiology: Results for orders placed or performed during the hospital encounter of 06/24/24  Resp panel by RT-PCR (RSV, Flu A&B, Covid) Anterior Nasal Swab     Status: None   Collection Time: 06/24/24 10:48 AM   Specimen: Anterior Nasal Swab  Result Value Ref Range Status   SARS Coronavirus 2 by RT PCR NEGATIVE NEGATIVE Final    Comment: (NOTE) SARS-CoV-2 target nucleic acids are NOT DETECTED.  The SARS-CoV-2 RNA is generally detectable in upper respiratory specimens during the acute phase of infection. The lowest concentration of SARS-CoV-2 viral copies this assay can detect is 138 copies/mL. A negative result does not preclude SARS-Cov-2 infection and should not be used as the sole basis for treatment or other patient management decisions. A negative result may occur with  improper specimen collection/handling, submission of specimen other than nasopharyngeal swab, presence of viral mutation(s) within the areas targeted by this assay, and inadequate number of viral copies(<138 copies/mL). A negative result must be combined with clinical observations, patient history, and epidemiological information. The expected result is Negative.  Fact Sheet for Patients:  bloggercourse.com  Fact Sheet for Healthcare Providers:  seriousbroker.it  This test is no t yet approved or cleared by the [place]  FDA and  has been authorized for detection and/or diagnosis of SARS-CoV-2 by FDA under an Emergency Use Authorization (EUA). This EUA will remain  in effect (meaning this test can be used) for the duration of the COVID-19 declaration under Section 564(b)(1) of the Act, 21 U.S.C.section 360bbb-3(b)(1), unless the authorization is terminated  or revoked sooner.  Influenza A by PCR NEGATIVE NEGATIVE Final    Influenza B by PCR NEGATIVE NEGATIVE Final    Comment: (NOTE) The Xpert Xpress SARS-CoV-2/FLU/RSV plus assay is intended as an aid in the diagnosis of influenza from Nasopharyngeal swab specimens and should not be used as a sole basis for treatment. Nasal washings and aspirates are unacceptable for Xpert Xpress SARS-CoV-2/FLU/RSV testing.  Fact Sheet for Patients: bloggercourse.com  Fact Sheet for Healthcare Providers: seriousbroker.it  This test is not yet approved or cleared by the [place]  FDA and has been authorized for detection and/or diagnosis of SARS-CoV-2 by FDA under an Emergency Use Authorization (EUA). This EUA will remain in effect (meaning this test can be used) for the duration of the COVID-19 declaration under Section 564(b)(1) of the Act, 21 U.S.C. section 360bbb-3(b)(1), unless the authorization is terminated or revoked.     Resp Syncytial Virus by PCR NEGATIVE NEGATIVE Final    Comment: (NOTE) Fact Sheet for Patients: bloggercourse.com  Fact Sheet for Healthcare Providers: seriousbroker.it  This test is not yet approved or cleared by the [place]  FDA and has been authorized for detection and/or diagnosis of SARS-CoV-2 by FDA under an Emergency Use Authorization (EUA). This EUA will remain in effect (meaning this test can be used) for the duration of the COVID-19 declaration under Section 564(b)(1) of the Act, 21 U.S.C. section 360bbb-3(b)(1), unless the authorization is terminated or revoked.  Performed at [facility], [address] [place], [place] [address]   Group A Strep by PCR     Status: None   Collection Time: 06/24/24 10:48 AM   Specimen: Throat; Sterile Swab  Result Value Ref Range Status   Group A Strep by PCR NOT DETECTED NOT DETECTED Final    Comment: Performed at [facility], [address] [place], [place] [address]   Labs: CBC: Recent Labs  Lab 06/24/24 1052 06/25/24 0307  WBC 15.8* 12.6*  HGB 15.7 13.9  HCT 48.0 41.8  MCV 83.3 82.0  PLT 258 234   Basic Metabolic Panel: Recent Labs  Lab 06/24/24 1052 06/25/24 0307  NA 136 135  K 4.5 4.2  CL 100 102  CO2 25 23  GLUCOSE 128* 137*  BUN 18 12  CREATININE 1.31* 1.06  CALCIUM  9.9 9.2   Liver Function Tests: Recent Labs  Lab 06/25/24 0307  AST 13*  ALT 12  ALKPHOS 46  BILITOT 0.7  PROT 6.3*  ALBUMIN 3.2*   CBG: Recent Labs  Lab 06/25/24 0038 06/25/24 0340 06/25/24 0754 06/25/24 1109  GLUCAP 137* 134* 126* 112*    Discharge time spent: greater than 30 minutes.  Author: Yetta Blanch, MD  [facility] 06/25/2024

## 2024-06-26 NOTE — Transitions of Care (Post Inpatient/ED Visit) (Signed)
 06/26/2024  Name: Richard Walker MRN: [id number] DOB: 14-May-1956  Today's TOC FU Call Status: Today's TOC FU Call Status:: Successful TOC FU Call Completed TOC FU Call Complete Date: 06/26/24 Patient's Name and Date of Birth confirmed.  Transition Care Management Follow-up Telephone Call Date of Discharge: 06/25/24 Discharge Facility: [name]) Type of Discharge: Inpatient Admission Primary Inpatient Discharge Diagnosis:: pharyngitis How have you been since you were released from the hospital?: Better Any questions or concerns?: No  Items Reviewed: Did you receive and understand the discharge instructions provided?: Yes Medications obtained,verified, and reconciled?: Yes (Medications Reviewed) Any new allergies since your discharge?: No Dietary orders reviewed?: Yes Do you have support at home?: No  Medications Reviewed Today: Medications Reviewed Today     Reviewed by Emmitt Pan, LPN (Licensed Practical Nurse) on 06/26/24 at 1102  Med List Status: <None>   Medication Order Taking? Sig Documenting Provider Last Dose Status Informant  albuterol  (PROAIR  HFA) 108 (90 Base) MCG/ACT inhaler [id number] Yes Inhale 1-2 puffs into the lungs every 6 (six) hours as needed for wheezing or shortness of breath. Kassie Acquanetta Bradley, MD  Active Self, Pharmacy Records  amoxicillin -clavulanate (AUGMENTIN ) 875-125 MG tablet [id number] Yes Take 1 tablet by mouth 2 (two) times daily. Saguier, [place], PA-C  Active Self, Pharmacy Records  azelastine  (ASTELIN ) 0.1 % nasal spray [id number]  1-2 sprays per nostril 2 times daily as needed.  Patient not taking: Reported on 06/26/2024   Almarie Waddell NOVAK, NP  Active Self, Pharmacy Records  budesonide -glycopyrrolate -formoterol  (BREZTRI  AEROSPHERE) 160-9-4.8 MCG/ACT AERO inhaler [id number] Yes Inhale 2 puffs into the lungs in the morning and at bedtime. Kassie Acquanetta Bradley, MD  Active Self, Pharmacy Records  cholecalciferol  (VITAMIN D3) 25 MCG (1000 UNIT)  tablet [id number] Yes Take 3,000-4,000 Units by mouth daily. [provider]  Active Self, Pharmacy Records  clopidogrel  (PLAVIX ) 75 MG tablet [id number] Yes Take 1 tablet (75 mg total) by mouth daily. Wyn Jackee VEAR Mickey., NP  Active Self, Pharmacy Records  docusate sodium  (COLACE) 100 MG capsule [id number]  Take 1 capsule (100 mg total) by mouth 2 (two) times daily.  Patient not taking: Reported on 06/26/2024   Shirly Carlin CROME, PA-C  Active Self, Pharmacy Records  ezetimibe  (ZETIA ) 10 MG tablet [id number] Yes Take 1 tablet (10 mg total) by mouth daily. Wyn Jackee VEAR Mickey., NP  Active Self, Pharmacy Records  fenofibrate  (TRICOR ) 145 MG tablet [id number] Yes Take 1 tablet (145 mg total) by mouth daily. Saguier, [place], PA-C  Active Self, Pharmacy Records  gabapentin  (NEURONTIN ) 100 MG capsule [id number]  Take 1 capsule (100 mg total) by mouth 2 (two) times daily.  Patient not taking: Reported on 06/26/2024   Addie Cordella Valincia Touch, MD  Active Self, Pharmacy Records           Med Note LEOBARDO, NICOLE   Mon Jun 25, 2024  1:08 AM) Out of refills  losartan  (COZAAR ) 25 MG tablet [id number] Yes Take 1 tablet (25 mg total) by mouth daily. Saguier, Edward, PA-C  Active Self, Pharmacy Records  Magnesium  250 MG TABS [id number] Yes Take 250 mg by mouth once a week. [provider]  Active Self, Pharmacy Records  metFORMIN  (GLUCOPHAGE ) 500 MG tablet [id number] Yes TAKE 1 TABLET(500 MG) BY MOUTH TWICE DAILY WITH A MEAL Saguier, [place], PA-C  Active Self, Pharmacy Records  methocarbamol  (ROBAXIN ) 500 MG tablet [id number] Yes Take 1 tablet (500 mg total) by mouth every 8 (eight) hours as needed for muscle spasms. Magnant,  Carlin CROME, PA-C  Active Self, Pharmacy Records           Med Note LEOBARDO, NICOLE   Mon Jun 25, 2024 12:44 AM) Pt has at home when needed.   metoprolol  tartrate (LOPRESSOR ) 25 MG tablet [id number] Yes Take one tablet by mouth twice a day.  May take one additional tablet by mouth as needed for  breakthrough palpitations. Wyn Jackee VEAR Mickey., NP  Active Self, Pharmacy Records  oxyCODONE  (ROXICODONE ) 5 MG immediate release tablet [id number] Yes Take 1 tablet (5 mg total) by mouth every 8 (eight) hours as needed for severe pain (pain score 7-10). Vernetta Lonni GRADE, MD  Active Self, Pharmacy Records  pantoprazole  (PROTONIX ) 40 MG tablet [id number] Yes Take 1 tablet (40 mg total) by mouth daily. Saguier, [place], PA-C  Active Self, Pharmacy Records  polyethylene glycol (MIRALAX / GLYCOLAX) 17 g packet [id number] Yes Take 17 g by mouth daily as needed for mild constipation. [provider]  Active Self, Pharmacy Records  rivaroxaban  (XARELTO ) 20 MG TABS tablet [id number] Yes Take 1 tablet (20 mg total) by mouth daily with supper. Wyn Jackee VEAR Mickey., NP  Active Self, Pharmacy Records  rosuvastatin  (CRESTOR ) 10 MG tablet [id number] Yes Take 1 tablet (10 mg total) by mouth daily. Wyn Jackee VEAR Mickey., NP  Active Self, Pharmacy Records  sildenafil  (VIAGRA ) 100 MG tablet [id number] Yes Take 1 tablet (100 mg total) by mouth daily as needed for erectile dysfunction. Waddell Danelle ORN, MD  Active Self, Pharmacy Records  tirzepatide  (ZEPBOUND ) 7.5 MG/0.5ML Pen [id number] Yes Inject 7.5 mg into the skin once a week. Francyne Romano, MD  Active Self, Pharmacy Records           Med Note LEOBARDO, NICOLE   Mon Jun 25, 2024 12:40 AM) Injects on Monday  Vitamin D , Ergocalciferol , (DRISDOL ) 1.25 MG (50000 UNIT) CAPS capsule [id number] Yes Take 1 capsule (50,000 Units total) by mouth every 7 (seven) days. Saguier, [place] RIGGERS  Active Self, Pharmacy Records           Med Note LEOBARDO, NICOLE   Mon Jun 25, 2024 12:41 AM) Emily on Wednesday            Home Care and Equipment/Supplies: Were Home Health Services Ordered?: NA Any new equipment or medical supplies ordered?: NA  Functional Questionnaire: Do you need assistance with bathing/showering or dressing?: No Do you need assistance with meal preparation?:  No Do you need assistance with eating?: No Do you have difficulty maintaining continence: No Do you need assistance with getting out of bed/getting out of a chair/moving?: No Do you have difficulty managing or taking your medications?: No  Follow up appointments reviewed: PCP Follow-up appointment confirmed?: Yes Date of PCP follow-up appointment?: 06/27/24 Follow-up Provider: [facility] Follow-up appointment confirmed?: NA Do you need transportation to your follow-up appointment?: No Do you understand care options if your condition(s) worsen?: Yes-patient verbalized understanding    SIGNATURE Julian Lemmings, LPN [facility] Nurse Health Advisor Direct Dial [phone]

## 2024-06-27 ENCOUNTER — Ambulatory Visit (INDEPENDENT_AMBULATORY_CARE_PROVIDER_SITE_OTHER): Payer: Medicare (Managed Care) | Admitting: Medical

## 2024-06-27 ENCOUNTER — Encounter: Payer: Self-pay | Admitting: Medical

## 2024-06-27 ENCOUNTER — Other Ambulatory Visit (HOSPITAL_BASED_OUTPATIENT_CLINIC_OR_DEPARTMENT_OTHER): Payer: Self-pay

## 2024-06-27 ENCOUNTER — Other Ambulatory Visit: Payer: Self-pay | Admitting: Orthopaedic Surgery

## 2024-06-27 VITALS — BP 118/68 | HR 69 | Temp 97.8°F | Resp 18 | Ht 68.0 in | Wt 220.4 lb

## 2024-06-27 DIAGNOSIS — D72829 Elevated white blood cell count, unspecified: Secondary | ICD-10-CM

## 2024-06-27 DIAGNOSIS — E785 Hyperlipidemia, unspecified: Secondary | ICD-10-CM

## 2024-06-27 DIAGNOSIS — L905 Scar conditions and fibrosis of skin: Secondary | ICD-10-CM

## 2024-06-27 DIAGNOSIS — I2510 Atherosclerotic heart disease of native coronary artery without angina pectoris: Secondary | ICD-10-CM | POA: Diagnosis not present

## 2024-06-27 DIAGNOSIS — E559 Vitamin D deficiency, unspecified: Secondary | ICD-10-CM

## 2024-06-27 DIAGNOSIS — K219 Gastro-esophageal reflux disease without esophagitis: Secondary | ICD-10-CM

## 2024-06-27 DIAGNOSIS — R739 Hyperglycemia, unspecified: Secondary | ICD-10-CM

## 2024-06-27 DIAGNOSIS — I2584 Coronary atherosclerosis due to calcified coronary lesion: Secondary | ICD-10-CM | POA: Diagnosis not present

## 2024-06-27 DIAGNOSIS — T7840XA Allergy, unspecified, initial encounter: Secondary | ICD-10-CM

## 2024-06-27 DIAGNOSIS — Z7984 Long term (current) use of oral hypoglycemic drugs: Secondary | ICD-10-CM

## 2024-06-27 MED ORDER — FENOFIBRATE 145 MG PO TABS
145.0000 mg | ORAL_TABLET | Freq: Every day | ORAL | 3 refills | Status: DC
  Filled 2024-06-27 – 2024-08-29 (×2): qty 90, 90d supply, fill #0

## 2024-06-27 MED ORDER — VITAMIN D (ERGOCALCIFEROL) 1.25 MG (50000 UNIT) PO CAPS
50000.0000 [IU] | ORAL_CAPSULE | ORAL | 0 refills | Status: DC
  Filled 2024-06-27: qty 8, 56d supply, fill #0

## 2024-06-27 MED ORDER — FENOFIBRATE 145 MG PO TABS
145.0000 mg | ORAL_TABLET | Freq: Every day | ORAL | 0 refills | Status: DC
  Filled 2024-06-27: qty 90, 90d supply, fill #0

## 2024-06-27 MED ORDER — METHOCARBAMOL 500 MG PO TABS
500.0000 mg | ORAL_TABLET | Freq: Three times a day (TID) | ORAL | 0 refills | Status: DC | PRN
  Filled 2024-06-27: qty 30, 10d supply, fill #0

## 2024-06-27 NOTE — Patient Instructions (Addendum)
 Possible allergic reaction to nitroglycerin  Recent tongue swelling and dysphagia post-nitroglycerin  suggestive of allergy. Symptoms improved with IV Decadron and Benadryl . - Referred to allergist for evaluation of nitroglycerin  allergy. - Referred to cardiologist for further evaluation and management.  Atherosclerotic heart disease of native coronary artery - No myocardial infarction history.  -recent tongue swelling after he used nitroglycerin . - Referred to cardiologist for follow-up and management.  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 Using vitamin E for management. - Recommended Mederma or Scarzone for scar management.  Hyperlipidemia Cholesterol controlled with rosuvastatin  and Zetia . Fenofibrate  discontinued. - Continue rosuvastatin  10 mg once daily. - Continue Zetia  once daily. -refill of fenofibrate  today.  Vitamin D  deficiency Supplementation required. Previous prescription for 50,000 IU weekly. - Refilled vitamin D  50,000 IU weekly. -also on 4000 international units daily.  Elevated white blood cell count Slight elevation, likely stress-related. - Ordered repeat CBC to monitor.  Hyperglycemia Recent elevated glucose levels, possibly stress-related. A1c previously 6.2%. - Continue metformin  twice daily. - Monitor blood glucose levels. -CMP    Gastroesophageal reflux disease GERD managed with pantoprazole . Recent EGD unremarkable. - Continue pantoprazole  as prescribed.  Follow up date to be after lab review

## 2024-06-27 NOTE — Progress Notes (Signed)
 Subjective:    Patient ID: Richard Walker, male    DOB: 06-Jun-1956, 68 y.o.   MRN: [id number]  HPI   06/24/2024     Patient coming from: Home.   Chief Complaint: Difficulty swallowing.   HPI: Richard Walker is a 68 y.o. male with history of CAD status post PCI on May 01, 2024, diabetes mellitus type 2, sleep apnea, atrial fibrillation, SVT status post ablation presents to the ER with complaints of pain and difficulty swallowing since waking up this morning.  Patient initially was concerned that it may be his heart.  Denies any associated shortness of breath productive cough fever or chills.  He felt his tongue was getting numb.  When he tried to drink something it was painful and he had to throw up.  Due to persistent symptoms he presented to the ER.   ED Course: In the ER patient underwent extensive workup including CT angiogram of the neck with and without contrast, CT head, CT angiogram chest abdomen pelvis and MRI brain which all did not show anything acute.  On-call gastroenterologist Dr. Legrand from [facility] was consulted.  Patient was initially given a dose of Decadron and Benadryl  IV.  Patient admitted for further workup of odynophagia/dysphagia.  Labs showed WBC of 15.8 troponins were negative.  EKG shows normal sinus rhythm.   Review of Systems: As per HPI, rest all negative.    EKG: Independently reviewed.  Normal sinus rhythm.   Assessment/Plan Principal Problem:   Odynophagia Active Problems:   Sleep apnea   SVT (supraventricular tachycardia)   Paroxysmal atrial fibrillation (HCC)   Hypertension   CAD (coronary artery disease)   Dysphagia   Diabetes mellitus type 2 in nonobese (HCC)  Odynophagia/dysphagia cause not clear.  [facility] Dr. Legrand has been consulted.  Will keep patient n.p.o. for now. CAD status post recent PCI on May 01, 2024.  I discussed with on-call cardiologist Dr. Shelda Bruckner who advised that since patient needs to be on  antiplatelet agents and if not able to continue to take it orally then advised to start patient on cangrelor infusion.  And once patient can take orally start back Plavix  then discontinue cangrelor. Paroxysmal atrial fibrillation takes Xarelto  presently on heparin  infusion.  Per cardiology okay to continue heparin  infusion and cangrelor infusion.  Will keep patient on scheduled dose of metoprolol  IV. History of SVT status post ablation. Sleep apnea on CPAP at bedtime. Diabetes mellitus type 2 takes Zepbound  and metformin  last hemoglobin A1c was 5.4 two  weeks ago.  Presently on sliding scale coverage. History of chronic knee pain on oxycodone  confirmed on PDMP website presently on morphine .   Admit date:     06/24/2024  Discharge date: 06/25/2024  Discharge Physician: Yetta Blanch  PCP: Dorina Loving, PA-C   Recommendations at discharge: Follow-up with PCP as recommended.   Hospital Course: : Richard Walker is a 68 y.o. male with history of CAD status post PCI on May 01, 2024, diabetes mellitus type 2, sleep apnea, atrial fibrillation, SVT status post ablation presents to the ER with complaints of pain and difficulty swallowing since waking up this morning.    Assessment and plan.   Odynophagia/dysphagia  Etiology not clear. Suspect that this occurred after patient took nitroglycerin . Patient actually reports that his tongue was swollen and he could not feel anything on his tongue and speech clearly. Suspect either localized reaction or a systemic reaction to nitroglycerin . For now would recommend to avoid  nitroglycerin  going forward. GI was consulted. Underwent EGD. Completely normal. Patient was able to swallow diet.   CAD status post recent PCI  on May 01, 2024. Cardiology was consulted. Patient is back on his home medication. Discontinue nitroglycerin .   Paroxysmal atrial fibrillation  takes Xarelto , continue.   History of SVT status post ablation. Sleep apnea  on CPAP at bedtime. Diabetes mellitus type 2 takes Zepbound  and metformin  last hemoglobin A1c was 5.4 two  weeks ago.  Resume home medication. History of chronic knee pain on oxycodone  confirmed on PDMP website presently on morphine . Obesity.Body mass index is 33.41 kg/m.  Placing the patient already has a work note.   Consultants:  Cardiology GI   Richard Walker is a 68 year old male with coronary artery disease who presents with a suspected allergic reaction to nitroglycerin .  He experienced a suspected allergic reaction after taking nitroglycerin  for the first time due to chest discomfort. He awoke with worsening symptoms, including a swollen and thick tongue, difficulty swallowing, and a sensation of a bubble in his mouth. He was unable to swallow saliva and was spitting frequently. He was admitted to the hospital where he received IV medications, including steroids and Benadryl , which led to improvement in his symptoms within a couple of hours.  He underwent an MRI and CT scan, both of which were negative for stroke. An endoscopy was performed, which showed no abnormalities in the esophagus or stomach. Despite these findings, he continued to experience difficulty swallowing and a sensation of tongue swelling.  He has a history of coronary artery disease and had a stent placed previously. His troponin level was normal.  He is currently taking metformin  twice a day and zepbound  for wt loss. Rrosuvastatin 10 mg once a day, and Zetia  once a day for cholesterol management. He also takes pantoprazole  for acid reflux. He has no prior history of severe allergic reactions.  His family history includes concerns about bone density, as his father and grandfather had related issues due to low vit d. He has been actively losing weight and exercising.  Review of Systems See hpi    Objective:   Physical Exam  General Mental Status- Alert. General Appearance- Not in acute distress.    Skin General: Color- Normal Color. Moisture- Normal Moisture.  Neck  No JVD.  Chest and Lung Exam Auscultation: Breath Sounds:-Normal.  Cardiovascular Auscultation:Rythm- Regular. Murmurs & Other Heart Sounds:Auscultation of the heart reveals- No Murmurs.  Abdomen Inspection:-Inspeection Normal. Palpation/Percussion:Note:No mass. Palpation and Percussion of the abdomen reveal- Non Tender, Non Distended + BS, no rebound or guarding.    Neurologic Cranial Nerve exam:- CN III-XII intact(No nystagmus), symmetric smile. Strength:- 5/5 equal and symmetric strength both upper and lower extremities.   Heent- no sinus pressure. Posterior pharynx normal. Tongue is normal/not swollen.  Lower ext- calfs not swollen and symmetric. Negative homans signs. No pedal edema.     Assessment & Plan:   Patient Instructions  Possible allergic reaction to nitroglycerin  Recent tongue swelling and dysphagia post-nitroglycerin  suggestive of allergy. Symptoms improved with IV Decadron and Benadryl . - Referred to allergist for evaluation of nitroglycerin  allergy. - Referred to cardiologist for further evaluation and management.  Atherosclerotic heart disease of native coronary artery Chest pain episode post-nitroglycerin  use. No myocardial infarction history. Recent negative MRI for stroke. - Referred to cardiologist for follow-up and management.  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 Using vitamin E for management. - Recommended Mederma or  Scarzone for scar management.  Hyperlipidemia Cholesterol controlled with rosuvastatin  and Zetia . Fenofibrate  discontinued. - Continue rosuvastatin  10 mg once daily. - Continue Zetia  once daily.  Vitamin D  deficiency Supplementation required. Previous prescription for 50,000 IU weekly. - Refilled vitamin D  50,000 IU weekly.  Elevated white blood cell count Slight elevation, likely stress-related. -  Ordered repeat CBC to monitor.  Hyperglycemia Recent elevated glucose levels, possibly stress-related. A1c previously 6.2%. - Continue metformin  twice daily. - Monitor blood glucose levels.  Gastroesophageal reflux disease GERD managed with pantoprazole . Recent EGD unremarkable. - Continue pantoprazole  as prescribed.  Follow up date to be after lab review   [place] Maxwell, PA-C   I personally spent a total of 45 minutes in the care of the patient today including getting/reviewing separately obtained history, performing a medically appropriate exam/evaluation, counseling and educating, placing orders, referring and communicating with other health care professionals, and documenting clinical information in the EHR.

## 2024-06-28 ENCOUNTER — Encounter: Payer: Self-pay | Admitting: Medical

## 2024-06-28 LAB — COMPLETE METABOLIC PANEL WITHOUT GFR
AG Ratio: 1.6 (calc) (ref 1.0–2.5)
ALT: 12 U/L (ref 9–46)
AST: 14 U/L (ref 10–35)
Albumin: 4.1 g/dL (ref 3.6–5.1)
Alkaline phosphatase (APISO): 49 U/L (ref 35–144)
BUN: 23 mg/dL (ref 7–25)
CO2: 25 mmol/L (ref 20–32)
Calcium: 10.2 mg/dL (ref 8.6–10.3)
Chloride: 102 mmol/L (ref 98–110)
Creat: 1.02 mg/dL (ref 0.70–1.35)
Globulin: 2.6 g/dL (ref 1.9–3.7)
Glucose, Bld: 97 mg/dL (ref 65–99)
Potassium: 3.7 mmol/L (ref 3.5–5.3)
Sodium: 137 mmol/L (ref 135–146)
Total Bilirubin: 0.5 mg/dL (ref 0.2–1.2)
Total Protein: 6.7 g/dL (ref 6.1–8.1)

## 2024-06-28 LAB — CBC WITH DIFFERENTIAL/PLATELET
Basophils Absolute: 0.1 K/uL (ref 0.0–0.1)
Basophils Relative: 1 % (ref 0.0–3.0)
Eosinophils Absolute: 0 K/uL (ref 0.0–0.7)
Eosinophils Relative: 0.3 % (ref 0.0–5.0)
HCT: 42.4 % (ref 39.0–52.0)
Hemoglobin: 14.3 g/dL (ref 13.0–17.0)
Lymphocytes Relative: 19 % (ref 12.0–46.0)
Lymphs Abs: 1.7 K/uL (ref 0.7–4.0)
MCHC: 33.7 g/dL (ref 30.0–36.0)
MCV: 81.9 fl (ref 78.0–100.0)
Monocytes Absolute: 0.6 K/uL (ref 0.1–1.0)
Monocytes Relative: 7.1 % (ref 3.0–12.0)
Neutro Abs: 6.6 K/uL (ref 1.4–7.7)
Neutrophils Relative %: 72.6 % (ref 43.0–77.0)
Platelets: 294 K/uL (ref 150.0–400.0)
RBC: 5.17 Mil/uL (ref 4.22–5.81)
RDW: 14.5 % (ref 11.5–15.5)
WBC: 9 K/uL (ref 4.0–10.5)

## 2024-06-28 LAB — VITAMIN D 25 HYDROXY (VIT D DEFICIENCY, FRACTURES): VITD: 48.78 ng/mL (ref 30.00–100.00)

## 2024-06-29 ENCOUNTER — Ambulatory Visit: Payer: Self-pay | Admitting: Medical

## 2024-06-29 ENCOUNTER — Other Ambulatory Visit (HOSPITAL_BASED_OUTPATIENT_CLINIC_OR_DEPARTMENT_OTHER): Payer: Self-pay

## 2024-07-02 ENCOUNTER — Other Ambulatory Visit: Payer: Self-pay

## 2024-07-02 ENCOUNTER — Other Ambulatory Visit (HOSPITAL_BASED_OUTPATIENT_CLINIC_OR_DEPARTMENT_OTHER): Payer: Self-pay

## 2024-07-02 ENCOUNTER — Other Ambulatory Visit: Payer: Self-pay | Admitting: Orthopedic Surgery

## 2024-07-02 MED ORDER — HYDROCODONE-ACETAMINOPHEN 5-325 MG PO TABS
1.0000 | ORAL_TABLET | Freq: Two times a day (BID) | ORAL | 0 refills | Status: DC | PRN
  Filled 2024-07-02: qty 30, 15d supply, fill #0

## 2024-07-02 NOTE — Telephone Encounter (Signed)
 I refilled him but with Norco instead of oxycodone .  Time for tapering

## 2024-07-02 NOTE — Telephone Encounter (Signed)
 Thanks.  Sorry you got it

## 2024-07-04 ENCOUNTER — Encounter (INDEPENDENT_AMBULATORY_CARE_PROVIDER_SITE_OTHER): Payer: Self-pay | Admitting: Internal Medicine

## 2024-07-04 ENCOUNTER — Other Ambulatory Visit (HOSPITAL_BASED_OUTPATIENT_CLINIC_OR_DEPARTMENT_OTHER): Payer: Self-pay

## 2024-07-04 ENCOUNTER — Ambulatory Visit (INDEPENDENT_AMBULATORY_CARE_PROVIDER_SITE_OTHER): Payer: Medicare (Managed Care) | Admitting: Internal Medicine

## 2024-07-04 VITALS — BP 120/79 | HR 82 | Temp 98.0°F | Ht 68.0 in | Wt 217.0 lb

## 2024-07-04 DIAGNOSIS — R638 Other symptoms and signs concerning food and fluid intake: Secondary | ICD-10-CM

## 2024-07-04 DIAGNOSIS — R7303 Prediabetes: Secondary | ICD-10-CM

## 2024-07-04 DIAGNOSIS — K760 Fatty (change of) liver, not elsewhere classified: Secondary | ICD-10-CM

## 2024-07-04 DIAGNOSIS — E66811 Obesity, class 1: Secondary | ICD-10-CM

## 2024-07-04 DIAGNOSIS — G4733 Obstructive sleep apnea (adult) (pediatric): Secondary | ICD-10-CM

## 2024-07-04 DIAGNOSIS — I10 Essential (primary) hypertension: Secondary | ICD-10-CM

## 2024-07-04 DIAGNOSIS — Z6832 Body mass index (BMI) 32.0-32.9, adult: Secondary | ICD-10-CM

## 2024-07-04 MED ORDER — ZEPBOUND 7.5 MG/0.5ML ~~LOC~~ SOAJ
7.5000 mg | SUBCUTANEOUS | 0 refills | Status: DC
  Filled 2024-07-04: qty 2, 28d supply, fill #0

## 2024-07-04 NOTE — Assessment & Plan Note (Signed)
 Blood pressure is at goal he is on metoprolol  and ARB.  Continue current regimen

## 2024-07-04 NOTE — Assessment & Plan Note (Signed)
 On Zepbound  for pharmacoprophylaxis.  Has been educated on the carbohydrate insulin  model for obesity.  He will continue to work on reducing simple and added sugars in his diet.

## 2024-07-04 NOTE — Progress Notes (Signed)
 Office: [phone]  /  Fax: [phone]  Weight Summary and Body Composition Analysis (BIA)  Vitals Temp: 98 F (36.7 C) BP: 120/79 Pulse Rate: 82 SpO2: 96 %   Anthropometric Measurements Height: 5' 8 (1.727 m) Weight: 217 lb (98.4 kg) BMI (Calculated): 33 Weight at Last Visit: 219 lb Weight Lost Since Last Visit: 2 lb Weight Gained Since Last Visit: 0 lb Starting Weight: 264 lb Peak Weight: 270 lb   Body Composition  Body Fat %: 31.9 % Fat Mass (lbs): 69.4 lbs Muscle Mass (lbs): 140.6 lbs Total Body Water  (lbs): 104 lbs Visceral Fat Rating : 19    RMR: 2405  Today's Visit #: 10  Starting Date: 11/03/23   Subjective   Chief Complaint: Obesity  Interval History Discussed the use of AI scribe software for clinical note transcription with the patient, who gave verbal consent to proceed.  History of Present Illness Richard Walker is a 68 year old male with obesity who presents for weight management. He is following a 1500 calorie RCNP with fair adherence.   Discussed the use of AI scribe software for clinical note transcription with the patient, who gave verbal consent to proceed.  History of Present Illness Richard Walker is a 68 year old male with hypertension, sleep apnea, prediabetes, and MASLD who presents for medical weight management.  He is currently on Zepbound  7.5 mg once a week for weight management and cardiovascular risk reduction, as well as treatment of obstructive sleep apnea. Since his last visit, he has lost two pounds and is adhering to an 1800 calorie diet but is not engaging in regular exercise.  He was recently hospitalized due to chest pain radiating to his throat. During this episode, he experienced swelling and soreness in his mouth and difficulty swallowing, prompting a hospital visit. Multiple tests, including blood tests and x-rays, were conducted, and he was discharged after an endoscopy showed no abnormalities. He is now able to  eat and drink without issues.  He has a history of atrial fibrillation and is on Xarelto  and Plavix  following a stent placement. Although he has not had a heart attack, he has plaque buildup and is on rosuvastatin  for cholesterol management.  He has MASLD, identified via CT scan. His prediabetes has improved significantly since starting the weight management program, with a reported weight loss of approximately 50 pounds over nine months. His A1c was last recorded at 5.2, indicating improvement.  He reports a significant reduction in waist size from 46 to 36 inches and notes improvements in sleep apnea symptoms.       Challenges affecting patient progress: having difficulty with meal prep and planning and medical comorbidities.    Pharmacotherapy for weight management: He is currently taking Zepbound  with adequate clinical response  and experiencing the following side effects: muscle loss.   Assessment and Plan   Treatment Plan For Obesity:  Recommended Dietary Goals  Richard Walker is currently in the action stage of change. As such, his goal is to continue weight management plan. He has agreed to: continue current plan  Behavioral Health and Counseling  We discussed the following behavioral modification strategies today: increasing lean protein intake to established goals, decreasing simple carbohydrates , increasing vegetables, increasing fiber rich foods, avoiding skipping meals, increasing water  intake , and work on meal planning and preparation.  Additional education and resources provided today: None  Recommended Physical Activity Goals  Richard Walker has been advised to work up to 150 minutes of moderate intensity  aerobic activity a week and strengthening exercises 2-3 times per week for cardiovascular health, weight loss maintenance and preservation of muscle mass.  He has agreed to :  Think about enjoyable ways to increase daily physical activity and overcoming barriers to exercise, Increase  physical activity in their day and reduce sedentary time (increase NEAT)., Increase volume of physical activity to a goal of 240 minutes a week, and Combine aerobic and strengthening exercises for efficiency and improved cardiometabolic health.  Medical Interventions and Pharmacotherapy  We discussed various medication options to help Richard Walker with his weight loss efforts and we both agreed to : Adequate clinical response to anti-obesity medication, continue current regimen and do not recommend further increases in GLP-1 due to decreases in muscle mass  Associated Conditions Impacted by Obesity Treatment  Assessment & Plan Primary hypertension Blood pressure is at goal he is on metoprolol  and ARB.  Continue current regimen OSA on CPAP Severe obstructive sleep apnea with current use of CPAP. Reports discomfort with CPAP use following significant weight loss. Acknowledges the importance of continued CPAP use due to severe apnea and its impact on heart rhythm. - Continue CPAP use nightly. Prediabetes On Zepbound  for pharmacoprophylaxis.  Has been educated on the carbohydrate insulin  model for obesity.  He will continue to work on reducing simple and added sugars in his diet. Abnormal food appetite Improved on GLP-1 monitor for muscle loss consider reduction in medication if unable to keep up with nutritional needs Class 1 obesity with serious comorbidity and body mass index (BMI) of 32.0 to 32.9 in adult, unspecified obesity type Weight: decrease of 50 lb (18.7%) over 9 months, 3 weeks  Start: 09/14/2023 267 lb (121.1 kg)  End: 07/04/2024 217 lb (98.4 kg)  Obesity with associated cardiometabolic comorbidities (hypertension, prediabetes, MASLD, hyperlipidemia, obstructive sleep apnea) Obesity with associated cardiometabolic comorbidities, including hypertension, prediabetes, MASLD, hyperlipidemia, and obstructive sleep apnea. Currently on Zepbound  7.5 mg weekly for weight management and cardiovascular  risk reduction. Weight loss of 50 pounds over nine months, with a recent loss of 2 pounds since last visit. No muscle loss noted. Blood sugar levels improving, with recent A1c at 5.2. MASLD managed with weight loss and dietary modifications. Prediabetes improving with current regimen. Obstructive sleep apnea managed with CPAP and weight loss. Zepbound  is beneficial for long-term management of these conditions, reducing risk of heart attack, stroke, and aiding in diabetes prevention. - Continue Zepbound  7.5 mg weekly. - Maintain 1500 calorie target. - Encouraged balanced diet with lean protein, vegetables, and limited carbohydrates. - Will reassess in one month to evaluate muscle mass and consider increasing Zepbound  to 10 mg if stable. Metabolic dysfunction-associated steatotic liver disease (MASLD) He has hepatic steatosis on CT scan completed in January of this year.  Fibrosis 4 Score = 1.3  He does not drink alcohol and is not on steatogenic medication.  He has lost 18.7%  He is also working on reducing saturated fats simple and processed sugars in his diet and is engaging in physical activity.  Continue current weight management strategy inclusive of GLP-1      Assessment and Plan Assessment & Plan   Atrial fibrillation Managed with Xarelto  and Plavix . Weight loss and management of associated conditions are beneficial for reducing AFib risk. - Continue Xarelto  and Plavix  as prescribed.  Coronary artery disease with prior stent placement Coronary artery disease with prior stent placement. Managed with Plavix  and rosuvastatin . Weight loss and cholesterol management are beneficial for reducing cardiovascular risk. - Continue Plavix   and rosuvastatin  as prescribed.  Recording duration: 18 minutes   Assessment and Plan      Objective   Physical Exam:  Blood pressure 120/79, pulse 82, temperature 98 F (36.7 C), height 5' 8 (1.727 m), weight 217 lb (98.4 kg), SpO2 96%. Body  mass index is 32.99 kg/m.  General: He is overweight, cooperative, alert, well developed, and in no acute distress. PSYCH: Has normal mood, affect and thought process.   HEENT: EOMI, sclerae are anicteric. Lungs: Normal breathing effort, no conversational dyspnea. Extremities: No edema.  Neurologic: No gross sensory or motor deficits. No tremors or fasciculations noted.    Diagnostic Data Reviewed:  BMET    Component Value Date/Time   NA 137 06/27/2024 1534   NA 138 04/26/2024 1232   K 3.7 06/27/2024 1534   CL 102 06/27/2024 1534   CO2 25 06/27/2024 1534   GLUCOSE 97 06/27/2024 1534   BUN 23 06/27/2024 1534   BUN 14 04/26/2024 1232   CREATININE 1.02 06/27/2024 1534   CALCIUM  10.2 06/27/2024 1534   GFRNONAA >60 06/25/2024 0307   GFRNONAA >89 03/04/2014 1456   GFRAA >60 07/15/2018 1324   GFRAA >89 03/04/2014 1456   Lab Results  Component Value Date   HGBA1C 5.4 06/12/2024   HGBA1C 5.7 12/24/2015   Lab Results  Component Value Date   INSULIN  25.3 (H) 11/03/2023   Lab Results  Component Value Date   TSH 1.81 01/20/2024   CBC    Component Value Date/Time   WBC 9.0 06/27/2024 1534   RBC 5.17 06/27/2024 1534   HGB 14.3 06/27/2024 1534   HGB 14.5 05/16/2024 1515   HCT 42.4 06/27/2024 1534   HCT 44.9 05/16/2024 1515   PLT 294.0 06/27/2024 1534   PLT 244 05/16/2024 1515   MCV 81.9 06/27/2024 1534   MCV 87 05/16/2024 1515   MCH 27.3 06/25/2024 0307   MCHC 33.7 06/27/2024 1534   RDW 14.5 06/27/2024 1534   RDW 13.2 05/16/2024 1515   Iron Studies    Component Value Date/Time   IRON 69 06/28/2018 1611   Lipid Panel     Component Value Date/Time   CHOL 120 06/12/2024 1142   TRIG 98.0 06/12/2024 1142   HDL 39.50 06/12/2024 1142   CHOLHDL 3 06/12/2024 1142   VLDL 19.6 06/12/2024 1142   LDLCALC 61 06/12/2024 1142   LDLDIRECT 150.0 03/10/2023 0951   Hepatic Function Panel     Component Value Date/Time   PROT 6.7 06/27/2024 1534   PROT 6.9 11/03/2023 1117    ALBUMIN 3.2 (L) 06/25/2024 0307   ALBUMIN 4.3 11/03/2023 1117   AST 14 06/27/2024 1534   ALT 12 06/27/2024 1534   ALKPHOS 46 06/25/2024 0307   BILITOT 0.5 06/27/2024 1534   BILITOT 0.3 11/03/2023 1117   BILIDIR 0.1 01/06/2017 1600      Component Value Date/Time   TSH 1.81 01/20/2024 1057   Nutritional Lab Results  Component Value Date   VD25OH 48.78 06/27/2024   VD25OH 55.49 01/20/2024   VD25OH 35.78 09/12/2023    Medications: Outpatient Encounter Medications as of 07/04/2024  Medication Sig Note   albuterol  (PROAIR  HFA) 108 (90 Base) MCG/ACT inhaler Inhale 1-2 puffs into the lungs every 6 (six) hours as needed for wheezing or shortness of breath.    amoxicillin -clavulanate (AUGMENTIN ) 875-125 MG tablet Take 1 tablet by mouth 2 (two) times daily.    azelastine  (ASTELIN ) 0.1 % nasal spray Place 1-2 sprays into both nostrils 2 (two) times  daily as needed.    budesonide -glycopyrrolate -formoterol  (BREZTRI  AEROSPHERE) 160-9-4.8 MCG/ACT AERO inhaler Inhale 2 puffs into the lungs in the morning and at bedtime.    cholecalciferol  (VITAMIN D3) 25 MCG (1000 UNIT) tablet Take 3,000-4,000 Units by mouth daily.    clopidogrel  (PLAVIX ) 75 MG tablet Take 1 tablet (75 mg total) by mouth daily.    docusate sodium  (COLACE) 100 MG capsule Take 1 capsule (100 mg total) by mouth 2 (two) times daily. (Patient not taking: Reported on 07/04/2024)    ezetimibe  (ZETIA ) 10 MG tablet Take 1 tablet (10 mg total) by mouth daily.    fenofibrate  (TRICOR ) 145 MG tablet Take 1 tablet (145 mg total) by mouth daily.    gabapentin  (NEURONTIN ) 100 MG capsule Take 1 capsule (100 mg total) by mouth 2 (two) times daily. (Patient not taking: Reported on 07/04/2024) 06/25/2024: Out of refills   HYDROcodone -acetaminophen  (NORCO/VICODIN) 5-325 MG tablet Take 1 tablet by mouth every 12 (twelve) hours as needed for moderate pain (pain score 4-6).    losartan  (COZAAR ) 25 MG tablet Take 1 tablet (25 mg total) by mouth daily.     Magnesium  250 MG TABS Take 250 mg by mouth once a week.    metFORMIN  (GLUCOPHAGE ) 500 MG tablet Take 1 tablet (500 mg total) by mouth 2 (two) times daily with a meal.    methocarbamol  (ROBAXIN ) 500 MG tablet Take 1 tablet (500 mg total) by mouth every 8 (eight) hours as needed for muscle spasms.    metoprolol  tartrate (LOPRESSOR ) 25 MG tablet Take one tablet by mouth twice a day.  May take one additional tablet by mouth as needed for breakthrough palpitations.    oxyCODONE  (ROXICODONE ) 5 MG immediate release tablet Take 1 tablet (5 mg total) by mouth every 8 (eight) hours as needed for severe pain (pain score 7-10).    pantoprazole  (PROTONIX ) 40 MG tablet Take 1 tablet (40 mg total) by mouth daily.    polyethylene glycol (MIRALAX / GLYCOLAX) 17 g packet Take 17 g by mouth daily as needed for mild constipation.    rivaroxaban  (XARELTO ) 20 MG TABS tablet Take 1 tablet (20 mg total) by mouth daily with supper.    rosuvastatin  (CRESTOR ) 10 MG tablet Take 1 tablet (10 mg total) by mouth daily.    sildenafil  (VIAGRA ) 100 MG tablet Take 1 tablet (100 mg total) by mouth daily as needed for erectile dysfunction.    tirzepatide  (ZEPBOUND ) 7.5 MG/0.5ML Pen Inject 7.5 mg into the skin once a week.    Vitamin D , Ergocalciferol , (DRISDOL ) 1.25 MG (50000 UNIT) CAPS capsule Take 1 capsule (50,000 Units total) by mouth every 7 (seven) days. 06/25/2024: Takes on Wednesday   Vitamin D , Ergocalciferol , (DRISDOL ) 1.25 MG (50000 UNIT) CAPS capsule Take 1 capsule (50,000 Units total) by mouth every 7 (seven) days.    [DISCONTINUED] fenofibrate  (TRICOR ) 145 MG tablet Take 1 tablet (145 mg total) by mouth daily.    [DISCONTINUED] tirzepatide  (ZEPBOUND ) 7.5 MG/0.5ML Pen Inject 7.5 mg into the skin once a week. 06/25/2024: Injects on Monday   No facility-administered encounter medications on file as of 07/04/2024.     Follow-Up   Return in about 4 weeks (around 08/01/2024) for For Weight Mangement with Dr. [email or web address].[email or web address] He  was informed of the importance of frequent follow up visits to maximize his success with intensive lifestyle modifications for his multiple health conditions.  Attestation Statement   Reviewed by clinician on day of visit: allergies, medications, problem list, medical history, surgical history,  family history, social history, and previous encounter notes.     Lucas Parker, MD

## 2024-07-04 NOTE — Assessment & Plan Note (Addendum)
 Weight: decrease of 50 lb (18.7%) over 9 months, 3 weeks  Start: 09/14/2023 267 lb (121.1 kg)  End: 07/04/2024 217 lb (98.4 kg)  Obesity with associated cardiometabolic comorbidities (hypertension, prediabetes, MASLD, hyperlipidemia, obstructive sleep apnea) Obesity with associated cardiometabolic comorbidities, including hypertension, prediabetes, MASLD, hyperlipidemia, and obstructive sleep apnea. Currently on Zepbound  7.5 mg weekly for weight management and cardiovascular risk reduction. Weight loss of 50 pounds over nine months, with a recent loss of 2 pounds since last visit. No muscle loss noted. Blood sugar levels improving, with recent A1c at 5.2. MASLD managed with weight loss and dietary modifications. Prediabetes improving with current regimen. Obstructive sleep apnea managed with CPAP and weight loss. Zepbound  is beneficial for long-term management of these conditions, reducing risk of heart attack, stroke, and aiding in diabetes prevention. - Continue Zepbound  7.5 mg weekly. - Maintain 1500 calorie target. - Encouraged balanced diet with lean protein, vegetables, and limited carbohydrates. - Will reassess in one month to evaluate muscle mass and consider increasing Zepbound  to 10 mg if stable.

## 2024-07-04 NOTE — Assessment & Plan Note (Signed)
 Severe obstructive sleep apnea with current use of CPAP. Reports discomfort with CPAP use following significant weight loss. Acknowledges the importance of continued CPAP use due to severe apnea and its impact on heart rhythm. - Continue CPAP use nightly.

## 2024-07-04 NOTE — Assessment & Plan Note (Signed)
 He has hepatic steatosis on CT scan completed in January of this year.  Fibrosis 4 Score = 1.3  He does not drink alcohol and is not on steatogenic medication.  He has lost 18.7%  He is also working on reducing saturated fats simple and processed sugars in his diet and is engaging in physical activity.  Continue current weight management strategy inclusive of GLP-1

## 2024-07-04 NOTE — Assessment & Plan Note (Signed)
 Improved on GLP-1 monitor for muscle loss consider reduction in medication if unable to keep up with nutritional needs

## 2024-07-11 ENCOUNTER — Ambulatory Visit: Payer: Medicare (Managed Care) | Admitting: Medical

## 2024-07-11 ENCOUNTER — Ambulatory Visit (INDEPENDENT_AMBULATORY_CARE_PROVIDER_SITE_OTHER): Payer: Medicare (Managed Care) | Admitting: Medical

## 2024-07-11 ENCOUNTER — Other Ambulatory Visit (HOSPITAL_BASED_OUTPATIENT_CLINIC_OR_DEPARTMENT_OTHER): Payer: Self-pay

## 2024-07-11 VITALS — BP 110/62 | HR 66 | Temp 97.7°F | Resp 15 | Ht 68.0 in | Wt 220.2 lb

## 2024-07-11 DIAGNOSIS — R7303 Prediabetes: Secondary | ICD-10-CM

## 2024-07-11 DIAGNOSIS — I2510 Atherosclerotic heart disease of native coronary artery without angina pectoris: Secondary | ICD-10-CM

## 2024-07-11 DIAGNOSIS — H538 Other visual disturbances: Secondary | ICD-10-CM

## 2024-07-11 DIAGNOSIS — I10 Essential (primary) hypertension: Secondary | ICD-10-CM

## 2024-07-11 DIAGNOSIS — H547 Unspecified visual loss: Secondary | ICD-10-CM

## 2024-07-11 DIAGNOSIS — G4730 Sleep apnea, unspecified: Secondary | ICD-10-CM | POA: Diagnosis not present

## 2024-07-11 DIAGNOSIS — E669 Obesity, unspecified: Secondary | ICD-10-CM

## 2024-07-11 DIAGNOSIS — G8929 Other chronic pain: Secondary | ICD-10-CM

## 2024-07-11 DIAGNOSIS — M25562 Pain in left knee: Secondary | ICD-10-CM

## 2024-07-11 MED ORDER — HYDROCODONE-ACETAMINOPHEN 5-325 MG PO TABS
1.0000 | ORAL_TABLET | Freq: Four times a day (QID) | ORAL | 0 refills | Status: DC | PRN
  Filled 2024-07-11 (×2): qty 20, 5d supply, fill #0

## 2024-07-11 NOTE — Patient Instructions (Signed)
 Atherosclerotic heart disease, post-stent placement Post-stent placement, no current symptoms. Difficulty scheduling cardiology follow-up. - Referred to cardiologist for follow-up appointment ASAP. - Consider referral to Dr. Krasowski if no response from cardiologist.  Essential hypertension Blood pressure controlled at 110/62 mmHg. Occasional dizziness possibly due to low blood pressure. - Monitor blood pressure daily, especially if dizzy. - Send blood pressure readings update on Monday. - Adjust medication if readings consistently low.  Obesity and weight management BMI reduced to 33. On Zepbound  for weight management and sleep apnea. Discussed potential Zepbound  reduction if hypoglycemia occurs. - Continue Zepbound  at current dose. - Monitor blood sugar if dizziness or blurred vision. - Consider reducing Zepbound  if hypoglycemia confirmed. - Sleep apnea Using CPAP with improved symptoms. Reports dry mouth and excessive pressure. Considering re-evaluation due to weight loss. - Discuss with sleep specialist about potential re-evaluation and sleep study.  Prediabetes A1c at 5.4%. Reports low blood sugar episodes with dizziness and blurred vision. On metformin  and Zepbound . - Reduced metformin  to once daily. - Monitor blood sugar with glucometer. - Consider reducing Zepbound  if hypoglycemia persists. -decrease metformin  to once daily.  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 Discussed risks of chronic oxycodone  use and transition to Norco. - Prescribed Norco 5-325 mg, 20 tablets. - Monitor pain levels and adjust treatment as needed.  Intermittent blurred vision Intermittent blurred vision for two months. Vision changes in both eyes. Insurance requires eye pressure check. - Referred to optometrist for vision evaluation and eye pressure check.  Follow up in 4-6 weeks or sooner if needed

## 2024-07-11 NOTE — Progress Notes (Signed)
 Subjective:    Patient ID: Richard Walker, male    DOB: 01/24/1956, 68 y.o.   MRN: [id number]  HPI  Richard Walker is a 68 year old male with obesity and sleep apnea who presents with blurred vision and dizziness.  Blurred vision and dizziness have persisted for nearly two months, with vision described as blurred intermittently. No report of blind spots or light flashing. Vision clarity has declined since October. There is no chest pain or palpitations.  He has prediabetes with an improved  A1c of 5.4 as of October 21st, 2025. He takes metformin  and Zepbound  for obesity and sleep apnea. Recent blood sugar readings are 87 and 82, which he considers low, and he experiences dizziness and blurred vision when his sugar is low.  He has coronary artery disease with a stent placement and is currently in cardiac rehabilitation, which began two days ago. He exercises three times a week and has completed physical therapy for his knee. He feels out of energy on some days and is concerned about his cardiac health.  He uses a CPAP machine for sleep apnea, experiencing dry mouth and excessive pressure. He has lost weight and questions the necessity of the CPAP machine.  He experiences left knee pain post-surgery, which sometimes hinders his ability to exercise, particularly with gym equipment. He request refill of oxycodone  that ortho rx. I expressed different lower potent narcotic.    Review of Systems See hpi    Objective:   Physical Exam  General Mental Status- Alert. General Appearance- Not in acute distress.   Skin General: Color- Normal Color. Moisture- Normal Moisture.  Neck Carotid Arteries- Normal color. Moisture- Normal Moisture. No carotid bruits. No JVD.  Chest and Lung Exam Auscultation: Breath Sounds:-CTA  Cardiovascular Auscultation:Rythm- RRR Murmurs & Other Heart Sounds:Auscultation of the heart reveals- No Murmurs.  Abdomen Inspection:-Inspeection  Normal. Palpation/Percussion:Note:No mass. Palpation and Percussion of the abdomen reveal- Non Tender, Non Distended + BS, no rebound or guarding.   Neurologic Cranial Nerve exam:- CN III-XII intact(No nystagmus), symmetric smile. Strength:- 5/5 equal and symmetric strength both upper and lower extremities.   VA- rt eye 20/50  left eye 20/30. Both 20/25    Assessment & Plan:   Patient Instructions  Atherosclerotic heart disease, post-stent placement Post-stent placement, no current symptoms. Difficulty scheduling cardiology follow-up. - Referred to cardiologist for follow-up appointment ASAP. - Consider referral to Dr. Krasowski if no response from cardiologist.  Essential hypertension Blood pressure controlled at 110/62 mmHg. Occasional dizziness possibly due to low blood pressure. - Monitor blood pressure daily, especially if dizzy. - Send blood pressure readings update on Monday. - Adjust medication if readings consistently low.  Obesity and weight management BMI reduced to 33. On Zepbound  for weight management and sleep apnea. Discussed potential Zepbound  reduction if hypoglycemia occurs. - Continue Zepbound  at current dose. - Monitor blood sugar if dizziness or blurred vision. - Consider reducing Zepbound  if hypoglycemia confirmed. - Sleep apnea Using CPAP with improved symptoms. Reports dry mouth and excessive pressure. Considering re-evaluation due to weight loss. - Discuss with sleep specialist about potential re-evaluation and sleep study.  Prediabetes A1c at 5.4%. Reports low blood sugar episodes with dizziness and blurred vision. On metformin  and Zepbound . - Reduced metformin  to once daily. - Monitor blood sugar with glucometer. - Consider reducing Zepbound  if hypoglycemia persists. -decrease metformin  to once daily.  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 Discussed risks of chronic oxycodone   use and  transition to Norco. - Prescribed Norco 5-325 mg, 20 tablets. - Monitor pain levels and adjust treatment as needed.  Intermittent blurred vision Intermittent blurred vision for two months. Vision changes in both eyes. Insurance requires eye pressure check. - Referred to optometrist for vision evaluation and eye pressure check.  Follow up in 4-6 weeks or sooner if needed   I personally spent a total of 50 minutes in the care of the patient today including performing a medically appropriate exam/evaluation, placing orders, referring and communicating with other health care professionals, and documenting clinical information in the EHR.

## 2024-07-16 ENCOUNTER — Other Ambulatory Visit (HOSPITAL_BASED_OUTPATIENT_CLINIC_OR_DEPARTMENT_OTHER): Payer: Self-pay

## 2024-07-16 ENCOUNTER — Ambulatory Visit: Payer: Medicare (Managed Care) | Admitting: Urology

## 2024-07-16 ENCOUNTER — Other Ambulatory Visit: Payer: Self-pay

## 2024-07-17 ENCOUNTER — Other Ambulatory Visit (HOSPITAL_BASED_OUTPATIENT_CLINIC_OR_DEPARTMENT_OTHER): Payer: Self-pay

## 2024-07-17 ENCOUNTER — Other Ambulatory Visit: Payer: Self-pay | Admitting: Orthopedic Surgery

## 2024-07-17 MED ORDER — GABAPENTIN 100 MG PO CAPS
100.0000 mg | ORAL_CAPSULE | Freq: Two times a day (BID) | ORAL | 0 refills | Status: AC
  Filled 2024-07-17 – 2024-07-23 (×2): qty 42, 21d supply, fill #0

## 2024-07-18 ENCOUNTER — Other Ambulatory Visit (HOSPITAL_BASED_OUTPATIENT_CLINIC_OR_DEPARTMENT_OTHER): Payer: Self-pay

## 2024-07-18 ENCOUNTER — Ambulatory Visit: Payer: Medicare (Managed Care) | Admitting: Internal Medicine

## 2024-07-18 ENCOUNTER — Encounter: Payer: Self-pay | Admitting: Internal Medicine

## 2024-07-18 VITALS — BP 108/76 | HR 75 | Temp 98.1°F | Ht 68.0 in | Wt 221.0 lb

## 2024-07-18 DIAGNOSIS — J3089 Other allergic rhinitis: Secondary | ICD-10-CM | POA: Diagnosis not present

## 2024-07-18 DIAGNOSIS — J393 Upper respiratory tract hypersensitivity reaction, site unspecified: Secondary | ICD-10-CM

## 2024-07-18 MED ORDER — CETIRIZINE HCL 10 MG PO TABS
10.0000 mg | ORAL_TABLET | Freq: Every day | ORAL | 1 refills | Status: AC | PRN
  Filled 2024-07-18: qty 100, 100d supply, fill #0

## 2024-07-18 MED ORDER — AZELASTINE HCL 0.1 % NA SOLN
2.0000 | Freq: Two times a day (BID) | NASAL | 1 refills | Status: AC | PRN
  Filled 2024-07-18: qty 90, 150d supply, fill #0

## 2024-07-18 NOTE — Patient Instructions (Addendum)
 Other Allergic Rhinitis: - Use nasal saline rinses before nose sprays such as with Neilmed Sinus Rinse.  Use distilled water .   - Use Azelastine  2 sprays each nostril twice daily as needed for congestion, drainage, runny nose.  Aim upward and outward.  - Use Zyrtec  10mg  daily as needed for runny nose, sneezing, itchy watery eyes.    Reaction - Keep track of any future reactions.  Write anything you ate 6 hours prior.   - I am not convinced this was related to nitroglycerin .   Follow up: 12/9 at 10 15 for skin testing.   Hold all anti-histamines (Xyzal , Allegra, Zyrtec , Claritin , Benadryl , Pepcid ) 3 days prior to next visit.

## 2024-07-18 NOTE — Progress Notes (Signed)
 NEW PATIENT  Date of Service/Encounter:  07/18/24  Consult requested by: Dorina Loving, PA-C   Subjective:   Richard Walker (DOB: Feb 02, 1956) is a 68 y.o. male who presents to the clinic on 07/18/2024 with a chief complaint of Allergic Rhinitis  (Watery, itchy eyes.  Runny & stuffy nose.  ) and Allergic Reaction (Throat was swollen and could not swallow, ended up in the ER.  Could not drink or swallow anything.  Had stents put in his heart and was given a medication to put under the tongue, was told to stop taking it until he is seen by the heart doctor.) .    History obtained from: chart review and patient.   Reaction:  Took nitroglycerin  at 330 AM and woke up around 430-5 with uncomfortable swallowing, dry thickened tongue, trouble swallowing, choking.  No hives, vomiting, diarrhea, trouble breathing, wheezing, coughing.  Went to urgent care and was transferred to [facility] ER. Admitted and underwent EGD that was normal; tolerated diet and was discharged home.  Told to avoid nitroglycerin .   Rhinitis:  Started a few years ago.  Symptoms include: nasal congestion and rhinorrhea  Occurs seasonally-Spring/Fall Potential triggers: not sure  Treatments tried:  PRN Flonase  Does not like using steroid sprays   Previous allergy testing: yes; many years ago. History of sinus surgery: no Nonallergic triggers: none     Reviewed:  06/25/2024: admitted to hospital for odynophagia/dysphagia, avoid nitroglycerin , EGD normal.   EGD 06/2024: normal esophagus, stomach, duodenum.   Past Medical History: Past Medical History:  Diagnosis Date   Arthritis    DDD (degenerative disc disease), lumbar    L3/L4   Diverticulosis    Dysrhythmia    SVT s/p ablation   Dysrhythmia    PAF   GERD (gastroesophageal reflux disease)    Hyperlipidemia    Low sperm motility    Myocardial infarction (HCC)    Pre-diabetes    Sleep apnea    uses cpap    SVT (supraventricular tachycardia)     Past  Surgical History: Past Surgical History:  Procedure Laterality Date   AMPUTATION Left 07/15/2018   Procedure: . REPAIR OF LEFT INDEX AND LONG FINGER BY FUSION /  REPAIR OF RING FINGER;  Surgeon: Sebastian Lenis, MD;  [facility];  Service: Orthopedics;  Laterality: Left;   BACK SURGERY     BLALOCK PROCEDURE     Ablation   CARDIAC CATHETERIZATION N/A 12/22/2015   Procedure: Left Heart Cath and Coronary Angiography;  Surgeon: Peter M Jordan, MD;  Location: [facility] INVASIVE CV LAB;  Service: Cardiovascular;  Laterality: N/A;   CARDIAC ELECTROPHYSIOLOGY STUDY AND ABLATION     CORONARY STENT INTERVENTION N/A 05/01/2024   Procedure: CORONARY STENT INTERVENTION;  Surgeon: Ladona Heinz, MD;  Location: [facility] INVASIVE CV LAB;  Service: Cardiovascular;  Laterality: N/A;   ELECTROPHYSIOLOGIC STUDY N/A 02/19/2016   Procedure: SVT Ablation;  Surgeon: Danelle LELON Birmingham, MD;  Location: [facility] INVASIVE CV LAB;  Service: Cardiovascular;  Laterality: N/A;   ESOPHAGOGASTRODUODENOSCOPY N/A 06/25/2024   Procedure: EGD (ESOPHAGOGASTRODUODENOSCOPY);  Surgeon: Albertus Heinz HERO, MD;  Location: [facility] ENDOSCOPY;  Service: Gastroenterology;  Laterality: N/A;   HAND SURGERY     LEFT HEART CATH AND CORONARY ANGIOGRAPHY N/A 05/01/2024   Procedure: LEFT HEART CATH AND CORONARY ANGIOGRAPHY;  Surgeon: Ladona Heinz, MD;  Location: [facility] INVASIVE CV LAB;  Service: Cardiovascular;  Laterality: N/A;   septal reconstruction  2017   TOTAL KNEE ARTHROPLASTY Left 03/27/2024   Procedure: ARTHROPLASTY,  KNEE, TOTAL, LEFT;  Surgeon: Addie Cordella Hamilton, MD;  Location: [facility] OR;  Service: Orthopedics;  Laterality: Left;   TURBINATE REDUCTION Bilateral 07/2016   WISDOM TOOTH EXTRACTION      Family History: Family History  Problem Relation Age of Onset   Stroke Mother    Heart disease Mother    High Cholesterol Mother    Sleep apnea Father    Hypertension Father    Food Allergy Son    Colon cancer Neg Hx    Stomach cancer Neg Hx    Allergic rhinitis Neg Hx     Angioedema Neg Hx    Asthma Neg Hx    Eczema Neg Hx    Immunodeficiency Neg Hx    Urticaria Neg Hx     Social History:  Flooring in bedroom: carpet Pets: none Tobacco use/exposure: quit in 1992 Job: n/a  Medication List:  Allergies as of 07/18/2024       Reactions   Nitroglycerin  Other (See Comments)   Tongue swelling and difficulty swallowing. Hospitalized and underwent EGD in 06/2024   Dust Mite Extract    Statins    Muscle aches   Tree Extract Other (See Comments)   Intolerance Grass        Medication List        Accurate as of July 18, 2024 11:56 AM. If you have any questions, ask your nurse or doctor.          albuterol  108 (90 Base) MCG/ACT inhaler Commonly known as: ProAir  HFA Inhale 1-2 puffs into the lungs every 6 (six) hours as needed for wheezing or shortness of breath.   amoxicillin -clavulanate 875-125 MG tablet Commonly known as: AUGMENTIN  Take 1 tablet by mouth 2 (two) times daily.   azelastine  0.1 % nasal spray Commonly known as: ASTELIN  Place 2 sprays into both nostrils 2 (two) times daily as needed for allergies. What changed:  how much to take reasons to take this Changed by: Arleta SHAUNNA Blanch   Breztri  Aerosphere 160-9-4.8 MCG/ACT Aero inhaler Generic drug: budesonide -glycopyrrolate -formoterol  Inhale 2 puffs into the lungs in the morning and at bedtime.   cetirizine  10 MG tablet Commonly known as: ZyrTEC  Allergy Take 1 tablet (10 mg total) by mouth daily as needed for allergies. Started by: Arleta SHAUNNA Blanch   cholecalciferol  25 MCG (1000 UNIT) tablet Commonly known as: VITAMIN D3 Take 3,000-4,000 Units by mouth daily.   clopidogrel  75 MG tablet Commonly known as: Plavix  Take 1 tablet (75 mg total) by mouth daily.   docusate sodium  100 MG capsule Commonly known as: Colace Take 1 capsule (100 mg total) by mouth 2 (two) times daily.   ezetimibe  10 MG tablet Commonly known as: Zetia  Take 1 tablet (10 mg total) by mouth daily.    fenofibrate  145 MG tablet Commonly known as: Tricor  Take 1 tablet (145 mg total) by mouth daily.   gabapentin  100 MG capsule Commonly known as: NEURONTIN  Take 1 capsule (100 mg total) by mouth 2 (two) times daily.   HYDROcodone -acetaminophen  5-325 MG tablet Commonly known as: NORCO/VICODIN Take 1 tablet by mouth every 6 (six) hours as needed for moderate pain (pain score 4-6).   losartan  25 MG tablet Commonly known as: COZAAR  Take 1 tablet (25 mg total) by mouth daily.   Magnesium  250 MG Tabs Take 250 mg by mouth once a week.   metFORMIN  500 MG tablet Commonly known as: GLUCOPHAGE  Take 1 tablet (500 mg total) by mouth 2 (two) times daily with a meal.  methocarbamol  500 MG tablet Commonly known as: ROBAXIN  Take 1 tablet (500 mg total) by mouth every 8 (eight) hours as needed for muscle spasms.   metoprolol  tartrate 25 MG tablet Commonly known as: LOPRESSOR  Take one tablet by mouth twice a day.  May take one additional tablet by mouth as needed for breakthrough palpitations.   oxyCODONE  5 MG immediate release tablet Commonly known as: Roxicodone  Take 1 tablet (5 mg total) by mouth every 8 (eight) hours as needed for severe pain (pain score 7-10).   pantoprazole  40 MG tablet Commonly known as: PROTONIX  Take 1 tablet (40 mg total) by mouth daily.   polyethylene glycol 17 g packet Commonly known as: MIRALAX / GLYCOLAX Take 17 g by mouth daily as needed for mild constipation.   rosuvastatin  10 MG tablet Commonly known as: Crestor  Take 1 tablet (10 mg total) by mouth daily.   sildenafil  100 MG tablet Commonly known as: VIAGRA  Take 1 tablet (100 mg total) by mouth daily as needed for erectile dysfunction.   Vitamin D  (Ergocalciferol ) 1.25 MG (50000 UNIT) Caps capsule Commonly known as: DRISDOL  Take 1 capsule (50,000 Units total) by mouth every 7 (seven) days.   Vitamin D  (Ergocalciferol ) 1.25 MG (50000 UNIT) Caps capsule Commonly known as: DRISDOL  Take 1 capsule  (50,000 Units total) by mouth every 7 (seven) days.   Xarelto  20 MG Tabs tablet Generic drug: rivaroxaban  Take 1 tablet (20 mg total) by mouth daily with supper.   Zepbound  7.5 MG/0.5ML Pen Generic drug: tirzepatide  Inject 7.5 mg into the skin once a week.         REVIEW OF SYSTEMS: Pertinent positives and negatives discussed in HPI.   Objective:   Physical Exam: BP 108/76   Pulse 75   Temp 98.1 F (36.7 C)   Ht 5' 8 (1.727 m)   Wt 221 lb (100.2 kg)   SpO2 97%   BMI 33.60 kg/m  Body mass index is 33.6 kg/m. GEN: alert, well developed HEENT: clear conjunctiva, nose with + mild inferior turbinate hypertrophy, pink nasal mucosa, slight clear rhinorrhea, + cobblestoning HEART: regular rate and rhythm, no murmur LUNGS: clear to auscultation bilaterally, no coughing, unlabored respiration ABDOMEN: soft, non distended  SKIN: no rashes or lesions   Assessment:   1. Other allergic rhinitis   2. Upper respiratory tract hypersensitivity reaction     Plan/Recommendations:  Other Allergic Rhinitis: - Due to turbinate hypertrophy, seasonal symptoms and unresponsive to over the counter meds, will perform skin testing to identify aeroallergen triggers.   - Use nasal saline rinses before nose sprays such as with Neilmed Sinus Rinse.  Use distilled water .   - Wishes to avoid steroid sprays.  - Use Azelastine  2 sprays each nostril twice daily as needed for congestion, drainage, runny nose.  Aim upward and outward.  - Use Zyrtec  10mg  daily as needed for runny nose, sneezing, itchy watery eyes.    Reaction - Keep track of any future reactions.  Write anything you ate 6 hours prior.   - I am not convinced this was related to nitroglycerin  and not even sure if it was even allergic in nature.  - Sxs of trouble swallowing/choking/tongue feeling thickened.   Follow up: 12/9 at 10 15 for skin testing 1-68.  No IDs.  Hold all anti-histamines (Xyzal , Allegra, Zyrtec , Claritin ,  Benadryl , Pepcid ) 3 days prior to next visit.    [name], MD [facility] 

## 2024-07-20 ENCOUNTER — Other Ambulatory Visit (HOSPITAL_BASED_OUTPATIENT_CLINIC_OR_DEPARTMENT_OTHER): Payer: Self-pay

## 2024-07-21 NOTE — Progress Notes (Signed)
 Chief Complaint: Blood seen in urine  History of Present Illness:  68 yo male sent by Katherene Maxwell PA for microscopic hematuria.  Past Medical History:  Past Medical History:  Diagnosis Date   Arthritis    DDD (degenerative disc disease), lumbar    L3/L4   Diverticulosis    Dysrhythmia    SVT s/p ablation   Dysrhythmia    PAF   GERD (gastroesophageal reflux disease)    Hyperlipidemia    Low sperm motility    Myocardial infarction (HCC)    Pre-diabetes    Sleep apnea    uses cpap    SVT (supraventricular tachycardia)     Past Surgical History:  Past Surgical History:  Procedure Laterality Date   AMPUTATION Left 07/15/2018   Procedure: . REPAIR OF LEFT INDEX AND LONG FINGER BY FUSION /  REPAIR OF RING FINGER;  Surgeon: Sebastian Lenis, MD;  Location: [facility] OR;  Service: Orthopedics;  Laterality: Left;   BACK SURGERY     BLALOCK PROCEDURE     Ablation   CARDIAC CATHETERIZATION N/A 12/22/2015   Procedure: Left Heart Cath and Coronary Angiography;  Surgeon: Peter M Jordan, MD;  Location: [facility] INVASIVE CV LAB;  Service: Cardiovascular;  Laterality: N/A;   CARDIAC ELECTROPHYSIOLOGY STUDY AND ABLATION     CORONARY STENT INTERVENTION N/A 05/01/2024   Procedure: CORONARY STENT INTERVENTION;  Surgeon: Ladona Heinz, MD;  Location: [facility] INVASIVE CV LAB;  Service: Cardiovascular;  Laterality: N/A;   ELECTROPHYSIOLOGIC STUDY N/A 02/19/2016   Procedure: SVT Ablation;  Surgeon: Danelle LELON Birmingham, MD;  Location: [facility] INVASIVE CV LAB;  Service: Cardiovascular;  Laterality: N/A;   ESOPHAGOGASTRODUODENOSCOPY N/A 06/25/2024   Procedure: EGD (ESOPHAGOGASTRODUODENOSCOPY);  Surgeon: Albertus Heinz HERO, MD;  Location: [facility] ENDOSCOPY;  Service: Gastroenterology;  Laterality: N/A;   HAND SURGERY     LEFT HEART CATH AND CORONARY ANGIOGRAPHY N/A 05/01/2024   Procedure: LEFT HEART CATH AND CORONARY ANGIOGRAPHY;  Surgeon: Ladona Heinz, MD;  Location: [facility] INVASIVE CV LAB;  Service: Cardiovascular;  Laterality: N/A;   septal  reconstruction  2017   TOTAL KNEE ARTHROPLASTY Left 03/27/2024   Procedure: ARTHROPLASTY, KNEE, TOTAL, LEFT;  Surgeon: Addie Cordella Hamilton, MD;  Location: [facility] OR;  Service: Orthopedics;  Laterality: Left;   TURBINATE REDUCTION Bilateral 07/2016   WISDOM TOOTH EXTRACTION      Allergies:  Allergies  Allergen Reactions   Nitroglycerin  Other (See Comments)    Tongue swelling and difficulty swallowing. Hospitalized and underwent EGD in 06/2024   Dust Mite Extract    Statins     Muscle aches   Tree Extract Other (See Comments)    Intolerance Grass    Family History:  Family History  Problem Relation Age of Onset   Stroke Mother    Heart disease Mother    High Cholesterol Mother    Sleep apnea Father    Hypertension Father    Food Allergy Son    Colon cancer Neg Hx    Stomach cancer Neg Hx    Allergic rhinitis Neg Hx    Angioedema Neg Hx    Asthma Neg Hx    Eczema Neg Hx    Immunodeficiency Neg Hx    Urticaria Neg Hx     Social History:  Social History   Tobacco Use   Smoking status: Former    Current packs/day: 0.00    Average packs/day: 1 pack/day for 10.0 years (10.0 ttl pk-yrs)    Types: Cigarettes  Start date: 93    Quit date: 15    Years since quitting: 33.9    Passive exposure: Past   Smokeless tobacco: Never   Tobacco comments:    Uses [employer] occasionally  Vaping Use   Vaping status: Never Used  Substance Use Topics   Alcohol use: No   Drug use: No    Review of symptoms:  Constitutional:  Negative for unexplained weight loss, night sweats, fever, chills ENT:  Negative for nose bleeds, sinus pain, painful swallowing CV:  Negative for chest pain, shortness of breath, exercise intolerance, palpitations, loss of consciousness Resp:  Negative for cough, wheezing, shortness of breath GI:  Negative for nausea, vomiting, diarrhea, bloody stools GU:  Positives noted in HPI; otherwise negative for gross hematuria, dysuria, urinary incontinence Neuro:   Negative for seizures, poor balance, limb weakness, slurred speech Psych:  Negative for lack of energy, depression, anxiety Endocrine:  Negative for polydipsia, polyuria, symptoms of hypoglycemia (dizziness, hunger, sweating) Hematologic:  Negative for anemia, purpura, petechia, prolonged or excessive bleeding, use of anticoagulants  Allergic:  Negative for difficulty breathing or choking as a result of exposure to anything; no shellfish allergy; no allergic response (rash/itch) to materials, foods  Physical exam: There were no vitals taken for this visit. GENERAL APPEARANCE:  Well appearing, well developed, well nourished, NAD HEENT: Atraumatic, Normocephalic. NECK: Normal appearance LUNGS: Normal inspiratory and expiratory excursion HEART: Regular Rate ABDOMEN: ***. GU: Phallus normal, no lesions. Scrotal skin normal. Testicles/epididymal structures normal. Meatus normal. Normal anal sphincter tone, prostate ***mL, symmetric, non nodular, non tender. EXTREMITIES: Moves all extremities well.  Without clubbing, cyanosis, or edema. NEUROLOGIC:  Alert and oriented x 3, normal gait, CN II-XII grossly intact.  MENTAL STATUS:  Appropriate. SKIN:  Warm, dry and intact.    Results: No results found for this or any previous visit (from the past 24 hours).  I have reviewed referring/prior physicians notes  I have reviewed urinalysis  I have reviewed PSA results  I have reviewed prior imaging  I have reviewed urine culture results  Assessment: ***   Plan: ***

## 2024-07-23 ENCOUNTER — Other Ambulatory Visit (HOSPITAL_BASED_OUTPATIENT_CLINIC_OR_DEPARTMENT_OTHER): Payer: Self-pay

## 2024-07-23 ENCOUNTER — Ambulatory Visit: Payer: Medicare (Managed Care) | Admitting: Urology

## 2024-07-23 VITALS — BP 97/64 | HR 70 | Ht 68.0 in | Wt 222.0 lb

## 2024-07-23 DIAGNOSIS — Z87448 Personal history of other diseases of urinary system: Secondary | ICD-10-CM

## 2024-07-23 DIAGNOSIS — Z09 Encounter for follow-up examination after completed treatment for conditions other than malignant neoplasm: Secondary | ICD-10-CM

## 2024-07-23 DIAGNOSIS — R3129 Other microscopic hematuria: Secondary | ICD-10-CM

## 2024-07-23 LAB — URINALYSIS, ROUTINE W REFLEX MICROSCOPIC
Bilirubin, UA: NEGATIVE
Glucose, UA: NEGATIVE
Ketones, UA: NEGATIVE
Leukocytes,UA: NEGATIVE
Nitrite, UA: NEGATIVE
Protein,UA: NEGATIVE
Specific Gravity, UA: 1.02 (ref 1.005–1.030)
Urobilinogen, Ur: 0.2 mg/dL (ref 0.2–1.0)
pH, UA: 5.5 (ref 5.0–7.5)

## 2024-07-23 LAB — MICROSCOPIC EXAMINATION

## 2024-07-24 NOTE — Progress Notes (Unsigned)
 Cardiology Office Note:  .   Date:  07/25/2024  ID:  Richard Walker, Richard Walker 1956-04-22, MRN [id number] PCP: Dorina Dallas RIGGERS  [facility] Providers Cardiologist:  Shelda Bruckner, MD Electrophysiologist:  Danelle Birmingham, MD   History of Present Illness: .   Richard Walker is a 68 y.o. PMH of hypertension, hypercholesterolemia, PAF (on Xarelto ), and SVT s/p ablation 01/2016, reactive airway disease with bronchial asthma, prediabetes mellitus, obesity now resolved, OSA on CPAP and GERD presenting with chest pain and abnormal nuclear stress test on 02/21/2024 in the LAD distribution and underwent cardiac catheterization on 05/01/2024 with CTO of LAD and implant of DES x 1 with angioplasty to the mid to distal LAD.    He was seen post angioplasty follow-up on 05/21/2024 where losartan  was held and beta-blocker dose reduced to 2 dizziness, fatigue and low blood pressure.  He now presents for a 46-month office visit.  He has multiple questions, regarding fatigue, sleep apnea, coronary artery disease  He has not had any chest pain or dyspnea or PND or orthopnea.  He has been actively losing and continues to be in cardiac rehab and enjoys this.    Discussed the use of AI scribe software for clinical note transcription with the patient, who gave verbal consent to proceed.  History of Present Illness Richard Walker is a 68 year old male with coronary artery disease who presents for follow-up regarding his cardiovascular health and medication management.  He has coronary artery disease with two coronary stents. He continues to have low energy despite the procedure, which he has already discussed with his electrophysiologist and had expected to improve.  He has sleep apnea treated with CPAP for 15 years. After a recent sleep study for persistent low energy, his CPAP pressure was increased from 9 to 12. He has lost 55 pounds, which has changed his CPAP needs.  He had an angioedema episode with tongue  and throat swelling requiring an emergency room visit and IV treatment after taking a medication. He could not swallow at that time. He is scheduled for allergy testing and has had no recurrence.  He is on clopidogrel  (Plavix ) and rivaroxaban  (Xarelto ) for atrial fibrillation.  His blood pressure and HbA1c have improved with weight loss and increased physical activity. He exercises regularly in a new program at [facility]. He has had no recent episodes of heart racing or shortness of breath.  Cardiac Studies relevent.    CARDIAC CATHETERIZATION 05/01/2024  Stenting of the proximal and mid LAD with implantation of 3.0 x 28 and a 2.75 x 20 mm Synergy XD DES, proximal stent optimization with 3.5 x 10 mm emerge [place] balloon        Labs   Lab Results  Component Value Date   CHOL 120 06/12/2024   HDL 39.50 06/12/2024   LDLCALC 61 06/12/2024   LDLDIRECT 150.0 03/10/2023   TRIG 98.0 06/12/2024   CHOLHDL 3 06/12/2024   No results found for: LIPOA  Recent Labs    06/07/24 1737 06/12/24 1142 06/24/24 1052 06/25/24 0307 06/27/24 1534  NA 139   < > 136 135 137  K 4.5   < > 4.5 4.2 3.7  CL 102   < > 100 102 102  CO2 25   < > 25 23 25   GLUCOSE 98   < > 128* 137* 97  BUN 15   < > 18 12 23   CREATININE 1.08   < > 1.31* 1.06  1.02  CALCIUM  10.3   < > 9.9 9.2 10.2  GFRNONAA >60  --  59* >60  --    < > = values in this interval not displayed.    Lab Results  Component Value Date   ALT 12 06/27/2024   AST 14 06/27/2024   ALKPHOS 46 06/25/2024   BILITOT 0.5 06/27/2024      Latest Ref Rng & Units 06/27/2024    3:34 PM 06/25/2024    3:07 AM 06/24/2024   10:52 AM  CBC  WBC 4.0 - 10.5 K/uL 9.0  12.6  15.8   Hemoglobin 13.0 - 17.0 g/dL 85.6  86.0  84.2   Hematocrit 39.0 - 52.0 % 42.4  41.8  48.0   Platelets 150.0 - 400.0 K/uL 294.0  234  258    Lab Results  Component Value Date   HGBA1C 5.4 06/12/2024    Lab Results  Component Value Date   TSH 1.81 01/20/2024      ROS  Review of Systems  Cardiovascular:  Negative for chest pain, dyspnea on exertion and leg swelling.   Physical Exam:   VS:  BP 119/81   Pulse 76   Ht 5' 8 (1.727 m)   Wt 222 lb (100.7 kg)   SpO2 98%   BMI 33.75 kg/m    Wt Readings from Last 3 Encounters:  07/25/24 222 lb (100.7 kg)  07/23/24 222 lb (100.7 kg)  07/18/24 221 lb (100.2 kg)    BP Readings from Last 3 Encounters:  07/25/24 119/81  07/23/24 97/64  07/18/24 108/76   Physical Exam Neck:     Vascular: No carotid bruit or JVD.  Cardiovascular:     Rate and Rhythm: Normal rate and regular rhythm.     Pulses: Intact distal pulses.     Heart sounds: Normal heart sounds. No murmur heard.    No gallop.  Pulmonary:     Effort: Pulmonary effort is normal.     Breath sounds: Normal breath sounds.  Abdominal:     General: Bowel sounds are normal.     Palpations: Abdomen is soft.  Musculoskeletal:     Right lower leg: No edema.     Left lower leg: No edema.    EKG:         ASSESSMENT AND PLAN: .      ICD-10-CM   1. Coronary artery disease involving native coronary artery of native heart without angina pectoris  I25.10 rosuvastatin  (CRESTOR ) 20 MG tablet    Lipid panel    2. Paroxysmal atrial fibrillation (HCC)  I48.0 Lipid panel    3. Primary hypertension  I10 Lipid panel    4. Pure hypercholesterolemia  E78.00 rosuvastatin  (CRESTOR ) 20 MG tablet    Lipid panel     Assessment & Plan Coronary artery disease status post percutaneous coronary intervention to proximal to mid LAD with stent placement on 05/01/2024 Status post stent placement for coronary artery disease with two blocked arteries. No evidence of myocardial damage. Low risk of stent restenosis (<0.1-0.5%). Improved quality of life expected with continued exercise and weight loss. - Provided stent card for travel purposes - Continue current medications including Plavix  and Xarelto  - Plan for knee surgery in January, with temporary cessation  of blood thinners as per surgical protocol. - Eventual plan to discontinue Plavix  and continue Xarelto  alone after total of 6 months of therapy for coronary stent.  Paroxysmal atrial fibrillation Managed with Xarelto . No recent episodes of heart racing or  palpitations. - Continue Xarelto  20 mg daily - No bleeding reported.  Primary hypertension Blood pressure previously low, leading to reduction in losartan  and metoprolol  dose. Current management appears effective with improved blood pressure control. - Continue metoprolol  to tartrate 25 mg twice daily and losartan  25 mg daily  Hypercholesterolemia LDL cholesterol at 61 mg/dL, target is 55 mg/dL. No side effects from current Crestor  regimen. Weight loss and exercise contributing to improved lipid profile. - Increased Crestor  to 20 mg daily - Ordered lipid panel in 6 weeks to 2 months after dose increase  Angioedema, possible medication-related or nonspecific Recent episode of angioedema with throat swelling and difficulty swallowing, possibly related to medication or an allergen, has allergy testing pending and was admitted to the hospital 06/12/2024 with spontaneous resolution.  - Continue with allergist appointment for further evaluation - Losartan  can rarely cause angioedema however patient has not had any recurrence in spite of continuing the losartan . - Ensure allergist reviews medical records and notes - Initial episode started after use sublingual nitroglycerin  and there are reports of angioedema like reactions with throat swelling, tongue swelling and dyspnea with sublingual nitroglycerin  use, hence patient advised not to use it any further.   Follow up: 6 months for CAD and hypercholesterolemia and PAF.  Signed,  [name], MD, [facility] 07/25/2024, 6:22 PM [facility] [address] [place], [place] [address] Phone: [phone]. Fax:  [phone]

## 2024-07-25 ENCOUNTER — Other Ambulatory Visit (HOSPITAL_BASED_OUTPATIENT_CLINIC_OR_DEPARTMENT_OTHER): Payer: Self-pay

## 2024-07-25 ENCOUNTER — Other Ambulatory Visit: Payer: Self-pay

## 2024-07-25 ENCOUNTER — Encounter: Payer: Self-pay | Admitting: Cardiology

## 2024-07-25 ENCOUNTER — Ambulatory Visit: Payer: Medicare (Managed Care) | Attending: Cardiology | Admitting: Cardiology

## 2024-07-25 VITALS — BP 119/81 | HR 76 | Ht 68.0 in | Wt 222.0 lb

## 2024-07-25 DIAGNOSIS — E7800 Pure hypercholesterolemia, unspecified: Secondary | ICD-10-CM

## 2024-07-25 DIAGNOSIS — I10 Essential (primary) hypertension: Secondary | ICD-10-CM | POA: Diagnosis not present

## 2024-07-25 DIAGNOSIS — I2510 Atherosclerotic heart disease of native coronary artery without angina pectoris: Secondary | ICD-10-CM

## 2024-07-25 DIAGNOSIS — I480 Paroxysmal atrial fibrillation: Secondary | ICD-10-CM | POA: Diagnosis not present

## 2024-07-25 MED ORDER — ROSUVASTATIN CALCIUM 20 MG PO TABS
20.0000 mg | ORAL_TABLET | Freq: Every day | ORAL | 3 refills | Status: DC
  Filled 2024-07-25: qty 90, 90d supply, fill #0

## 2024-07-25 NOTE — Patient Instructions (Signed)
 Medication Instructions:  Your physician has recommended you make the following change in your medication:  1) INCREASE Crestor  (rosuvastatin ) to 20 mg once daily *If you need a refill on your cardiac medications before your next appointment, please call your pharmacy*  Lab Work: IN 6 WEEKS: Fasting lipids  You may go to any of these [facility] locations:   [facility] - [address] ([facility]) - [address] [phone] [address]. [address] - [address] (1st floor, next to pharmacy)   [facility]  - [address]    Follow-Up: At [facility], you and your health needs are our priority.  As part of our continuing mission to provide you with exceptional heart care, our providers are all part of one team.  This team includes your primary Cardiologist (physician) and Advanced Practice Providers or APPs (Physician Assistants and Nurse Practitioners) who all work together to provide you with the care you need, when you need it.  Your next appointment:   6 months  Provider:   Dr. Ganji

## 2024-07-27 ENCOUNTER — Ambulatory Visit: Payer: Medicare (Managed Care) | Admitting: Gastroenterology

## 2024-07-30 NOTE — Progress Notes (Signed)
 Cardiac rehab notes and rhythms reviewed.

## 2024-07-31 ENCOUNTER — Ambulatory Visit: Payer: Medicare (Managed Care) | Admitting: Internal Medicine

## 2024-07-31 DIAGNOSIS — J301 Allergic rhinitis due to pollen: Secondary | ICD-10-CM

## 2024-07-31 DIAGNOSIS — J393 Upper respiratory tract hypersensitivity reaction, site unspecified: Secondary | ICD-10-CM

## 2024-07-31 NOTE — Patient Instructions (Addendum)
 Allergic Rhinitis: - SPT 07/2024: trees, grasses, weeds  - Use nasal saline rinses before nose sprays such as with Neilmed Sinus Rinse.  Use distilled water .   - Wishes to avoid steroid sprays.  - Use Azelastine  2 sprays each nostril twice daily as needed for congestion, drainage, runny nose.  Aim upward and outward.  - Use Zyrtec  10mg  daily as needed for runny nose, sneezing, itchy watery eyes.     Reaction - Keep track of any future reactions.  Write anything you ate 6 hours prior.     ALLERGEN AVOIDANCE MEASURES  Pollen Avoidance Pollen levels are highest during the mid-day and afternoon.  Consider this when planning outdoor activities. Avoid being outside when the grass is being mowed, or wear a mask if the pollen-allergic person must be the one to mow the grass. Keep the windows closed to keep pollen outside of the home. Use an air conditioner to filter the air. Take a shower, wash hair, and change clothing after working or playing outdoors during pollen season.

## 2024-07-31 NOTE — Progress Notes (Signed)
 FOLLOW UP Date of Service/Encounter:  07/31/24   Subjective:  Richard Walker (DOB: 02/26/1956) is a 68 y.o. male who [facility] for follow up for skin testing.   History obtained from: chart review and patient.  Anti histamines held.   Past Medical History: Past Medical History:  Diagnosis Date   Arthritis    DDD (degenerative disc disease), lumbar    L3/L4   Diverticulosis    Dysrhythmia    SVT s/p ablation   Dysrhythmia    PAF   GERD (gastroesophageal reflux disease)    Hyperlipidemia    Low sperm motility    Myocardial infarction (HCC)    Pre-diabetes    Sleep apnea    uses cpap    SVT (supraventricular tachycardia)     Objective:  There were no vitals taken for this visit. There is no height or weight on file to calculate BMI. Physical Exam: GEN: alert, well developed HEENT: clear conjunctiva, MMM LUNGS: unlabored respiration  Skin Testing:  Skin prick testing was placed, which includes aeroallergens/foods, histamine control, and saline control.  Verbal consent was obtained prior to placing test.  Patient tolerated procedure well.  Allergy  testing results were read and interpreted by myself, documented by clinical staff. Adequate positive and negative control.  Positive results to:  Results discussed with patient/family.  Airborne Adult Perc - 07/31/24 1019     Time Antigen Placed 1019    Allergen Manufacturer Jestine    Location Back    Number of Test 55    2. Control-Histamine 3+    3. Bahia 3+    4. Bermuda 3+    5. Johnson 2+    6. [place]  Blue 2+    7. Meadow Fescue Negative    8. Perennial Rye Negative    9. Timothy 3+    10. Ragweed Mix 3+    11. Cocklebur 2+    12. Plantain,  English Negative    13. Baccharis 3+    14. Dog Fennel 3+    15. Russian Thistle Negative    16. Lamb's Quarters Negative    17. Sheep Sorrell Negative    18. Rough Pigweed 3+    19. Marsh Elder, Rough 3+    20.  Mugwort, Common Negative    21. Box, Elder 3+    22. Cedar, red Negative    23. Sweet Gum 2+    24. Pecan Pollen 3+    25. Pine Mix Negative    26. Walnut, Black Pollen 3+    27. Red Mulberry 2+    28. Ash Mix 3+    29. Birch Mix 3+    30. Beech American 3+    31. Cottonwood, Eastern 2+    32. Hickory, White 2+    33. Maple Mix Negative    34. Oak, Eastern Mix Negative    35. Sycamore Eastern 3+    36. Alternaria Alternata Negative    37. Cladosporium Herbarum Negative    38. Aspergillus Mix Negative    39. Penicillium Mix Negative    40. Bipolaris Sorokiniana (Helminthosporium) Negative    41. Drechslera Spicifera (Curvularia) Negative    42. Mucor Plumbeus Negative    43. Fusarium Moniliforme Negative    44. Aureobasidium Pullulans (pullulara) Negative    45. Rhizopus Oryzae Negative    46. Botrytis Cinera Negative    47. Epicoccum Nigrum Negative    48. Phoma Betae Negative  49. Dust Mite Mix Negative    50. Cat Hair 10,000 BAU/ml Negative    51.  Dog Epithelia Negative    52. Mixed Feathers Negative    53. Horse Epithelia Negative    54. Cockroach, German Negative    55. Tobacco Leaf Negative          13 Food Perc - 07/31/24 1020       Test Information   Time Antigen Placed 1020    Allergen Manufacturer Greer    Location Back    Number of allergen test 13      Food   1. Peanut Negative    2. Soybean Negative    3. Wheat Negative    4. Sesame Negative    5. Milk, Cow Negative    6. Casein Negative    7. Egg White, Chicken Negative    8. Shellfish Mix Negative    9. Fish Mix Negative    10. Cashew Negative    11. Walnut Food Negative    12. Almond Negative    13. Hazelnut Negative          Food Adult Perc - 07/31/24 1000     Time Antigen Placed 1020    Allergen Manufacturer Greer    Location Back    Number of allergen test 1    47. Onion Negative           Assessment:   1. Seasonal allergic rhinitis due to pollen   2. Upper  respiratory tract hypersensitivity reaction     Plan/Recommendations:  Allergic Rhinitis: - Due to turbinate hypertrophy, seasonal symptoms and unresponsive to over the counter meds, will perform skin testing to identify aeroallergen triggers.   - SPT 07/2024: trees, grasses, weeds  - Use nasal saline rinses before nose sprays such as with Neilmed Sinus Rinse.  Use distilled water .   - Wishes to avoid steroid sprays.  - Use Azelastine  2 sprays each nostril twice daily as needed for congestion, drainage, runny nose.  Aim upward and outward.  - Use Zyrtec  10mg  daily as needed for runny nose, sneezing, itchy watery eyes.   - Not an AIT candidate in setting of CAD with interventions and beta blocker use    Reaction - SPT 07/2024: negative to commonly allergenic foods and onions.  - Keep track of any future reactions.  Write anything you ate 6 hours prior.   - I am not convinced this was related to nitroglycerin  and not even sure if it was even allergic in nature.  - Sxs of trouble swallowing/choking/tongue feeling thickened.       Return in about 3 months (around 10/29/2024).  [name], MD [facility] 

## 2024-08-01 ENCOUNTER — Other Ambulatory Visit (HOSPITAL_BASED_OUTPATIENT_CLINIC_OR_DEPARTMENT_OTHER): Payer: Self-pay

## 2024-08-01 ENCOUNTER — Ambulatory Visit (INDEPENDENT_AMBULATORY_CARE_PROVIDER_SITE_OTHER): Payer: Medicare (Managed Care) | Admitting: Internal Medicine

## 2024-08-01 ENCOUNTER — Telehealth (HOSPITAL_BASED_OUTPATIENT_CLINIC_OR_DEPARTMENT_OTHER): Payer: Self-pay

## 2024-08-01 ENCOUNTER — Encounter (INDEPENDENT_AMBULATORY_CARE_PROVIDER_SITE_OTHER): Payer: Self-pay | Admitting: Internal Medicine

## 2024-08-01 ENCOUNTER — Telehealth: Payer: Self-pay | Admitting: Orthopedic Surgery

## 2024-08-01 DIAGNOSIS — Z6832 Body mass index (BMI) 32.0-32.9, adult: Secondary | ICD-10-CM

## 2024-08-01 DIAGNOSIS — E66811 Obesity, class 1: Secondary | ICD-10-CM | POA: Diagnosis not present

## 2024-08-01 DIAGNOSIS — G4733 Obstructive sleep apnea (adult) (pediatric): Secondary | ICD-10-CM

## 2024-08-01 DIAGNOSIS — K760 Fatty (change of) liver, not elsewhere classified: Secondary | ICD-10-CM

## 2024-08-01 DIAGNOSIS — R7303 Prediabetes: Secondary | ICD-10-CM | POA: Diagnosis not present

## 2024-08-01 MED ORDER — ZEPBOUND 10 MG/0.5ML ~~LOC~~ SOAJ
10.0000 mg | SUBCUTANEOUS | 0 refills | Status: DC
  Filled 2024-08-01: qty 2, 28d supply, fill #0

## 2024-08-01 NOTE — Assessment & Plan Note (Signed)
 Most recent A1c is  Lab Results  Component Value Date   HGBA1C 5.4 06/12/2024   HGBA1C 5.7 12/24/2015   Which has improved via nutritional and behavioral strategies.  He has also increased physical activity.  He is also on Zepbound  for pharmacoprophylaxis.  Continue current management strategy

## 2024-08-01 NOTE — Telephone Encounter (Signed)
 Called patient to discuss surgery date.  No answer.  Left message on voicemail providing name and direct number.

## 2024-08-01 NOTE — Assessment & Plan Note (Signed)
 He has hepatic steatosis on CT scan completed in January of this year.  Fibrosis 4 Score = 1.3  He does not drink alcohol and is not on steatogenic medication.  He has lost 19 %  He is also working on reducing saturated fats simple and processed sugars in his diet and is engaging in physical activity.  Continue current weight management strategy inclusive of GLP-1

## 2024-08-01 NOTE — Telephone Encounter (Signed)
° °  Pre-operative Risk Assessment    Patient Name: Richard Walker  DOB: 10/21/1955 MRN: [id number]   Date of last office visit: 07/25/2024 with Dr. Ladona Date of next office visit: None  Request for Surgical Clearance    Procedure:  Right total knee  Date of Surgery:  Clearance 08/30/24                                 Surgeon:  Does not specify Surgeon's Group or Practice Name:  [facility]   Phone number:  [phone] Fax number:  [phone]   Type of Clearance Requested:   - Medical  - Pharmacy:  Hold Rivaroxaban  (Xarelto ) -Hold for 3 days and hold Plavix  for 7 days   Type of Anesthesia:  Spinal   Additional requests/questions:  None  Bonney Patrcia Iverson LITTIE   08/01/2024, 4:49 PM

## 2024-08-01 NOTE — Assessment & Plan Note (Signed)
 Patient reports intermittent use of CPAP he has lost an amount of weight and does not feel he needs it anymore he reports sleeping well and is asymptomatic.  Consider repeating sleep study to see if he still needs PAP therapy considering comorbidities.

## 2024-08-01 NOTE — Assessment & Plan Note (Signed)
 Weight: decrease of 52 lb (19.5%) over 10 months, 2 weeks  Start: 09/14/2023 267 lb (121.1 kg)  End: 08/01/2024 215 lb (97.5 kg)  Management is ongoing with a focus on weight loss and maintenance. He has experienced a reduction in waist circumference from 52 inches to 45.5 inches, indicating progress. He is engaging in increased physical activity and dietary modifications, including reducing sugar intake and consuming lean meats. He is not consuming alcohol and is avoiding artificial sweeteners. He is also incorporating legumes into his diet. His blood pressure and blood sugar levels have improved with weight loss. - Increased medication dosage of Zepbound  to 10 mg for better hunger control. - Continue aerobic exercise and dietary modifications. - Avoid alcohol and artificial sweeteners. - Consume lean meats and legumes. - Scheduled follow-up appointment in one month.

## 2024-08-01 NOTE — Progress Notes (Signed)
 Office: [phone]  /  Fax: [phone]  Weight Summary and Body Composition Analysis (BIA)  Vitals Temp: 97.6 F (36.4 C) BP: 122/76 Pulse Rate: 86 SpO2: 99 %   Anthropometric Measurements Height: 5' 8 (1.727 m) Weight: 215 lb (97.5 kg) BMI (Calculated): 32.7 Weight at Last Visit: 217 lb Weight Lost Since Last Visit: 2 lb Weight Gained Since Last Visit: 0 lb Starting Weight: 264 lb Total Weight Loss (lbs): 49 lb (22.2 kg) Peak Weight: 270 lb   Body Composition  Body Fat %: 31.9 % Fat Mass (lbs): 68.8 lbs Muscle Mass (lbs): 139.6 lbs Total Body Water  (lbs): 105.8 lbs Visceral Fat Rating : 19    RMR: 2405  Today's Visit #: 11  Starting Date: 11/03/23   Subjective   Chief Complaint: Obesity  Interval History Discussed the use of AI scribe software for clinical note transcription with the patient, who gave verbal consent to proceed.  History of Present Illness Richard Walker is a 68 year old male who presents for weight management follow-up.  Since last office visit he has lost 2 pounds he is following a 1500-calorie target mostly to portion control and increasing whole foods.  He has been provided with a [place] Eastern meal plan.  He has recently increased his physical activity, which he believes has contributed to weight loss despite experiencing increased food cravings. He snacks more between meals but does not skip meals.  He is noticing a waning effect of Zepbound  currently at 7.5 mg once a week.  He has been at this dose for several months  He has a history of knee surgery and is currently undergoing physical therapy, noting significant improvement in his recovery. Initially, he regretted the surgery but now feels much better.  He has lost significant weight, reducing his waist size from 52 inches to 45.5 inches, and notes a decrease in his pant size by 10 inches. His goal weight is 165 pounds, and he is actively working towards it by maintaining a diet  low in sugars and artificial sweeteners, and high in lean meats and legumes. He avoids alcohol and prefers seafood, although his wife is allergic to it.  His family, particularly his wife and kids, enjoy sweets, but he tries to avoid them. He incorporates healthy oils like olive oil into his diet and discusses cultural influences on his diet, mentioning Middle Eastern and Turkish cuisines.  His blood pressure and blood sugar levels have improved with weight loss. He uses a CPAP machine regularly and is considering a follow-up to adjust his blood pressure medication.     Challenges affecting patient progress: none.    Pharmacotherapy for weight management: He is currently taking Zepbound  with waning clinical response and without side effects..   Assessment and Plan   Treatment Plan For Obesity:  Recommended Dietary Goals  Richard is currently in the action stage of change. As such, his goal is to continue weight management plan. He has agreed to: continue current plan  Behavioral Health and Counseling  We discussed the following behavioral modification strategies today: increasing lean protein intake to established goals, decreasing simple carbohydrates , increasing fiber rich foods, avoiding skipping meals, continue to work on maintaining a reduced calorie state, getting the recommended amount of protein, incorporating whole foods, making healthy choices, staying well hydrated and practicing mindfulness when eating., and increase protein intake, fibrous foods (25 grams per day for women, 30 grams for men) and water  to improve satiety and decrease hunger signals. SABRA  Additional education and resources provided today: Handout on traveling and holiday eating strategies  Recommended Physical Activity Goals  Azion has been advised to work up to 150 minutes of moderate intensity aerobic activity a week and strengthening exercises 2-3 times per week for cardiovascular health, weight loss maintenance  and preservation of muscle mass.  He has agreed to :  Increase volume of physical activity to a goal of 240 minutes a week and Combine aerobic and strengthening exercises for efficiency and improved cardiometabolic health.  Medical Interventions and Pharmacotherapy  We discussed various medication options to help Walker with his weight loss efforts and we both agreed to : Increase Zepbound  to 10 mg once a week  Associated Conditions Impacted by Obesity Treatment  Assessment & Plan Metabolic dysfunction-associated steatotic liver disease (MASLD) He has hepatic steatosis on CT scan completed in January of this year.  Fibrosis 4 Score = 1.3  He does not drink alcohol and is not on steatogenic medication.  He has lost 19 %  He is also working on reducing saturated fats simple and processed sugars in his diet and is engaging in physical activity.  Continue current weight management strategy inclusive of GLP-1  Class 1 obesity with serious comorbidity and body mass index (BMI) of 32.0 to 32.9 in adult, unspecified obesity type Weight: decrease of 52 lb (19.5%) over 10 months, 2 weeks  Start: 09/14/2023 267 lb (121.1 kg)  End: 08/01/2024 215 lb (97.5 kg)  Management is ongoing with a focus on weight loss and maintenance. He has experienced a reduction in waist circumference from 52 inches to 45.5 inches, indicating progress. He is engaging in increased physical activity and dietary modifications, including reducing sugar intake and consuming lean meats. He is not consuming alcohol and is avoiding artificial sweeteners. He is also incorporating legumes into his diet. His blood pressure and blood sugar levels have improved with weight loss. - Increased medication dosage of Zepbound  to 10 mg for better hunger control. - Continue aerobic exercise and dietary modifications. - Avoid alcohol and artificial sweeteners. - Consume lean meats and legumes. - Scheduled follow-up appointment in one  month. OSA on CPAP Patient reports intermittent use of CPAP he has lost an amount of weight and does not feel he needs it anymore he reports sleeping well and is asymptomatic.  Consider repeating sleep study to see if he still needs PAP therapy considering comorbidities. Prediabetes Most recent A1c is  Lab Results  Component Value Date   HGBA1C 5.4 06/12/2024   HGBA1C 5.7 12/24/2015   Which has improved via nutritional and behavioral strategies.  He has also increased physical activity.  He is also on Zepbound  for pharmacoprophylaxis.  Continue current management strategy          Objective   Physical Exam:  Blood pressure 122/76, pulse 86, temperature 97.6 F (36.4 C), height 5' 8 (1.727 m), weight 215 lb (97.5 kg), SpO2 99%. Body mass index is 32.69 kg/m.  General: He is overweight, cooperative, alert, well developed, and in no acute distress. PSYCH: Has normal mood, affect and thought process.   HEENT: EOMI, sclerae are anicteric. Lungs: Normal breathing effort, no conversational dyspnea. Extremities: No edema.  Neurologic: No gross sensory or motor deficits. No tremors or fasciculations noted.    Diagnostic Data Reviewed:  BMET    Component Value Date/Time   NA 137 06/27/2024 1534   NA 138 04/26/2024 1232   K 3.7 06/27/2024 1534   CL 102 06/27/2024 1534  CO2 25 06/27/2024 1534   GLUCOSE 97 06/27/2024 1534   BUN 23 06/27/2024 1534   BUN 14 04/26/2024 1232   CREATININE 1.02 06/27/2024 1534   CALCIUM  10.2 06/27/2024 1534   GFRNONAA >60 06/25/2024 0307   GFRNONAA >89 03/04/2014 1456   GFRAA >60 07/15/2018 1324   GFRAA >89 03/04/2014 1456   Lab Results  Component Value Date   HGBA1C 5.4 06/12/2024   HGBA1C 5.7 12/24/2015   Lab Results  Component Value Date   INSULIN  25.3 (H) 11/03/2023   Lab Results  Component Value Date   TSH 1.81 01/20/2024   CBC    Component Value Date/Time   WBC 9.0 06/27/2024 1534   RBC 5.17 06/27/2024 1534   HGB 14.3  06/27/2024 1534   HGB 14.5 05/16/2024 1515   HCT 42.4 06/27/2024 1534   HCT 44.9 05/16/2024 1515   PLT 294.0 06/27/2024 1534   PLT 244 05/16/2024 1515   MCV 81.9 06/27/2024 1534   MCV 87 05/16/2024 1515   MCH 27.3 06/25/2024 0307   MCHC 33.7 06/27/2024 1534   RDW 14.5 06/27/2024 1534   RDW 13.2 05/16/2024 1515   Iron Studies    Component Value Date/Time   IRON 69 06/28/2018 1611   Lipid Panel     Component Value Date/Time   CHOL 120 06/12/2024 1142   TRIG 98.0 06/12/2024 1142   HDL 39.50 06/12/2024 1142   CHOLHDL 3 06/12/2024 1142   VLDL 19.6 06/12/2024 1142   LDLCALC 61 06/12/2024 1142   LDLDIRECT 150.0 03/10/2023 0951   Hepatic Function Panel     Component Value Date/Time   PROT 6.7 06/27/2024 1534   PROT 6.9 11/03/2023 1117   ALBUMIN 3.2 (L) 06/25/2024 0307   ALBUMIN 4.3 11/03/2023 1117   AST 14 06/27/2024 1534   ALT 12 06/27/2024 1534   ALKPHOS 46 06/25/2024 0307   BILITOT 0.5 06/27/2024 1534   BILITOT 0.3 11/03/2023 1117   BILIDIR 0.1 01/06/2017 1600      Component Value Date/Time   TSH 1.81 01/20/2024 1057   Nutritional Lab Results  Component Value Date   VD25OH 48.78 06/27/2024   VD25OH 55.49 01/20/2024   VD25OH 35.78 09/12/2023    Medications: Outpatient Encounter Medications as of 08/01/2024  Medication Sig   albuterol  (PROAIR  HFA) 108 (90 Base) MCG/ACT inhaler Inhale 1-2 puffs into the lungs every 6 (six) hours as needed for wheezing or shortness of breath.   azelastine  (ASTELIN ) 0.1 % nasal spray Place 2 sprays into both nostrils 2 (two) times daily as needed for allergies.   budesonide -glycopyrrolate -formoterol  (BREZTRI  AEROSPHERE) 160-9-4.8 MCG/ACT AERO inhaler Inhale 2 puffs into the lungs in the morning and at bedtime.   cetirizine  (ZYRTEC  ALLERGY ) 10 MG tablet Take 1 tablet (10 mg total) by mouth daily as needed for allergies.   cholecalciferol  (VITAMIN D3) 25 MCG (1000 UNIT) tablet Take 3,000-4,000 Units by mouth daily.   clopidogrel   (PLAVIX ) 75 MG tablet Take 1 tablet (75 mg total) by mouth daily.   docusate sodium  (COLACE) 100 MG capsule Take 1 capsule (100 mg total) by mouth 2 (two) times daily.   ezetimibe  (ZETIA ) 10 MG tablet Take 1 tablet (10 mg total) by mouth daily.   fenofibrate  (TRICOR ) 145 MG tablet Take 1 tablet (145 mg total) by mouth daily.   gabapentin  (NEURONTIN ) 100 MG capsule Take 1 capsule (100 mg total) by mouth 2 (two) times daily.   HYDROcodone -acetaminophen  (NORCO/VICODIN) 5-325 MG tablet Take 1 tablet by mouth every 6 (  six) hours as needed for moderate pain (pain score 4-6).   losartan  (COZAAR ) 25 MG tablet Take 1 tablet (25 mg total) by mouth daily.   Magnesium  250 MG TABS Take 250 mg by mouth once a week.   metFORMIN  (GLUCOPHAGE ) 500 MG tablet Take 1 tablet (500 mg total) by mouth 2 (two) times daily with a meal. (Patient taking differently: Take 500 mg by mouth daily.)   methocarbamol  (ROBAXIN ) 500 MG tablet Take 1 tablet (500 mg total) by mouth every 8 (eight) hours as needed for muscle spasms.   metoprolol  tartrate (LOPRESSOR ) 25 MG tablet Take one tablet by mouth twice a day.  May take one additional tablet by mouth as needed for breakthrough palpitations.   oxyCODONE  (ROXICODONE ) 5 MG immediate release tablet Take 1 tablet (5 mg total) by mouth every 8 (eight) hours as needed for severe pain (pain score 7-10).   pantoprazole  (PROTONIX ) 40 MG tablet Take 1 tablet (40 mg total) by mouth daily.   polyethylene glycol (MIRALAX / GLYCOLAX) 17 g packet Take 17 g by mouth daily as needed for mild constipation.   rivaroxaban  (XARELTO ) 20 MG TABS tablet Take 1 tablet (20 mg total) by mouth daily with supper.   rosuvastatin  (CRESTOR ) 20 MG tablet Take 1 tablet (20 mg total) by mouth daily.   sildenafil  (VIAGRA ) 100 MG tablet Take 1 tablet (100 mg total) by mouth daily as needed for erectile dysfunction.   tirzepatide  (ZEPBOUND ) 10 MG/0.5ML Pen Inject 10 mg into the skin once a week.   Vitamin D ,  Ergocalciferol , (DRISDOL ) 1.25 MG (50000 UNIT) CAPS capsule Take 1 capsule (50,000 Units total) by mouth every 7 (seven) days.   [DISCONTINUED] tirzepatide  (ZEPBOUND ) 7.5 MG/0.5ML Pen Inject 7.5 mg into the skin once a week.   [DISCONTINUED] amoxicillin -clavulanate (AUGMENTIN ) 875-125 MG tablet Take 1 tablet by mouth 2 (two) times daily. (Patient not taking: Reported on 07/25/2024)   No facility-administered encounter medications on file as of 08/01/2024.     Follow-Up   Return in about 4 weeks (around 08/29/2024) for For Weight Mangement with Dr. Francyne.SABRA He was informed of the importance of frequent follow up visits to maximize his success with intensive lifestyle modifications for his multiple health conditions.  Attestation Statement   Reviewed by clinician on day of visit: allergies, medications, problem list, medical history, surgical history, family history, social history, and previous encounter notes.     Lucas Francyne, MD

## 2024-08-02 ENCOUNTER — Encounter: Payer: Self-pay | Admitting: Cardiology

## 2024-08-02 NOTE — Progress Notes (Signed)
 Patient is 3 months post elective angioplasty and stenting to his LAD, patient is aware of risks and benefits of discontinuing DAPT for 3 months however states that this is lifestyle limiting and he is aware of the risks and wants to proceed.  Surgical risk profile and clearance sent to the surgical team.

## 2024-08-03 ENCOUNTER — Ambulatory Visit: Payer: Medicare (Managed Care) | Admitting: Orthopedic Surgery

## 2024-08-03 ENCOUNTER — Other Ambulatory Visit: Payer: Medicare (Managed Care)

## 2024-08-03 DIAGNOSIS — M1711 Unilateral primary osteoarthritis, right knee: Secondary | ICD-10-CM

## 2024-08-04 ENCOUNTER — Encounter: Payer: Self-pay | Admitting: Orthopedic Surgery

## 2024-08-04 NOTE — Progress Notes (Signed)
 Office Visit Note   Patient: Richard Walker           Date of Birth: 12-Sep-1955           MRN: [id number] Visit Date: 08/03/2024 Requested by: Dorina Loving, PA-C [id number] [place],  [place] [address] PCP: Dorina Loving RIGGERS  Subjective: Chief Complaint  Patient presents with   Left Knee - Follow-up    Left TKA 04/16/24   Right Knee - Pain    HPI: TARIQ PERNELL is a 68 y.o. male who presents to the office reporting right knee pain and also follow-up for left total knee replacement performed 03/27/2024.  Patient is scheduled for total knee replacement 08/30/2024.  Currently he is on Plavix  and.  Xarelto  following cardiac stent placement in September.  He was seen by his cardiologist in December who stated that he was okay to proceed with his knee replacement in January.  Patient has lost about 50 pounds this year and feels much better.  Still is having some pain in that right knee.  He has done a very good job of rehabilitation on that left-hand side in terms of getting strength and range of motion.              ROS: All systems reviewed are negative as they relate to the chief complaint within the history of present illness.  Patient denies fevers or chills.  Assessment & Plan: Visit Diagnoses:  1. Arthritis of right knee     Plan: Impression is doing well following left knee replacement.  Having little bit of lateral pain which looks like it may be iliotibial band related pain.  Does not look like it is radicular in nature.  Also has good range of motion in that right knee but with some medial greater than lateral joint line tenderness as well as patellofemoral crepitus.  Plan at this time is right total knee replacement.  Risk and benefits are discussed with the patient including not limited to infection or vessel damage incomplete pain relief as well as incomplete restoration of function.  Patient understands risk benefits and wishes to proceed.  All questions answered.   I do want to get clarification from his cardiologist about perioperative anticoagulation management.  Follow-Up Instructions: No follow-ups on file.   Orders:  Orders Placed This Encounter  Procedures   XR KNEE 3 VIEW RIGHT   No orders of the defined types were placed in this encounter.     Procedures: No procedures performed   Clinical Data: No additional findings.  Objective: Vital Signs: There were no vitals taken for this visit.  Physical Exam:  Constitutional: Patient appears well-developed HEENT:  Head: Normocephalic Eyes:EOM are normal Neck: Normal range of motion Cardiovascular: Normal rate Pulmonary/chest: Effort normal Neurologic: Patient is alert Skin: Skin is warm Psychiatric: Patient has normal mood and affect  Ortho Exam: Ortho exam demonstrates range of motion on the right 5-1 25.  No effusion.  DP pulse 1+ out of 4.  Left knee also has excellent range of motion of 0-1 25 with no effusion.  No flexion instability.  Improving quad strength on the left.  Specialty Comments:  No specialty comments available.  Imaging: No results found.   PMFS History: Patient Active Problem List   Diagnosis Date Noted   Chest pain 06/25/2024   Antiplatelet or antithrombotic long-term use 06/25/2024   Dysphagia 06/24/2024   Odynophagia 06/24/2024   Diabetes mellitus type 2 in nonobese (  HCC) 06/24/2024   Use of opiates for therapeutic purposes 05/09/2024   CAD (coronary artery disease) 05/01/2024   Arthritis of left knee 04/15/2024   S/P TKR (total knee replacement), left 03/29/2024   S/P total knee arthroplasty, left 03/27/2024   Constipation 02/14/2024   Metabolic dysfunction-associated steatotic liver disease (MASLD) 01/13/2024   Abnormal food appetite 11/17/2023   Insulin  resistance 11/17/2023   Prediabetes 11/03/2023   Degenerative disc disease, lumbar 12/09/2022   Class 1 obesity with serious comorbidity and body mass index (BMI) of 32.0 to 32.9 in adult  10/26/2022   Bronchitis 01/29/2022   OSA on CPAP 02/19/2021   Bite, snake 01/28/2021   Neck pain 11/13/2020   Vertigo 11/13/2020   Hypertension 11/03/2020   Degenerative arthritis of knee, bilateral 10/23/2020   Paroxysmal atrial fibrillation (HCC) 05/22/2019   Perennial and seasonal allergic rhinitis 11/09/2017   History of food allergy  11/09/2017   Bronchitis, mucopurulent recurrent (HCC) 09/07/2017   History of radiofrequency ablation procedure for PSVT 01/24/2016   Vitamin D  deficiency 04/09/2013   Esophageal reflux 04/09/2013   Hepatitis B carrier (HCC) 04/09/2013   Pure hypercholesterolemia 04/09/2013   Sleep apnea 04/09/2013   Past Medical History:  Diagnosis Date   Arthritis    DDD (degenerative disc disease), lumbar    L3/L4   Diverticulosis    Dysrhythmia    SVT s/p ablation   Dysrhythmia    PAF   GERD (gastroesophageal reflux disease)    Hyperlipidemia    Low sperm motility    Myocardial infarction (HCC)    Pre-diabetes    Sleep apnea    uses cpap    SVT (supraventricular tachycardia)     Family History  Problem Relation Age of Onset   Stroke Mother    Heart disease Mother    High Cholesterol Mother    Sleep apnea Father    Hypertension Father    Food Allergy  Son    Colon cancer Neg Hx    Stomach cancer Neg Hx    Allergic rhinitis Neg Hx    Angioedema Neg Hx    Asthma Neg Hx    Eczema Neg Hx    Immunodeficiency Neg Hx    Urticaria Neg Hx     Past Surgical History:  Procedure Laterality Date   AMPUTATION Left 07/15/2018   Procedure: . REPAIR OF LEFT INDEX AND LONG FINGER BY FUSION /  REPAIR OF RING FINGER;  Surgeon: Sebastian Lenis, MD;  Location: [facility] OR;  Service: Orthopedics;  Laterality: Left;   BACK SURGERY     BLALOCK PROCEDURE     Ablation   CARDIAC CATHETERIZATION N/A 12/22/2015   Procedure: Left Heart Cath and Coronary Angiography;  Surgeon: Peter M Jordan, MD;  Location: [facility] INVASIVE CV LAB;  Service: Cardiovascular;  Laterality: N/A;    CARDIAC ELECTROPHYSIOLOGY STUDY AND ABLATION     CORONARY STENT INTERVENTION N/A 05/01/2024   Procedure: CORONARY STENT INTERVENTION;  Surgeon: Ladona Heinz, MD;  Location: [facility] INVASIVE CV LAB;  Service: Cardiovascular;  Laterality: N/A;   ELECTROPHYSIOLOGIC STUDY N/A 02/19/2016   Procedure: SVT Ablation;  Surgeon: Danelle LELON Birmingham, MD;  Location: [facility] INVASIVE CV LAB;  Service: Cardiovascular;  Laterality: N/A;   ESOPHAGOGASTRODUODENOSCOPY N/A 06/25/2024   Procedure: EGD (ESOPHAGOGASTRODUODENOSCOPY);  Surgeon: Albertus Heinz HERO, MD;  Location: [facility] ENDOSCOPY;  Service: Gastroenterology;  Laterality: N/A;   HAND SURGERY     LEFT HEART CATH AND CORONARY ANGIOGRAPHY N/A 05/01/2024   Procedure: LEFT HEART CATH AND CORONARY ANGIOGRAPHY;  Surgeon: Ladona Heinz, MD;  Location: [facility] INVASIVE CV LAB;  Service: Cardiovascular;  Laterality: N/A;   septal reconstruction  2017   TOTAL KNEE ARTHROPLASTY Left 03/27/2024   Procedure: ARTHROPLASTY, KNEE, TOTAL, LEFT;  Surgeon: Addie Cordella Hamilton, MD;  Location: [facility] OR;  Service: Orthopedics;  Laterality: Left;   TURBINATE REDUCTION Bilateral 07/2016   WISDOM TOOTH EXTRACTION     Social History   Occupational History   Occupation: retired  Tobacco Use   Smoking status: Former    Current packs/day: 0.00    Average packs/day: 1 pack/day for 10.0 years (10.0 ttl pk-yrs)    Types: Cigarettes    Start date: 47    Quit date: 1992    Years since quitting: 33.9    Passive exposure: Past   Smokeless tobacco: Never   Tobacco comments:    Uses Hookah Pipe occasionally  Vaping Use   Vaping status: Never Used  Substance and Sexual Activity   Alcohol use: No   Drug use: No   Sexual activity: Yes

## 2024-08-06 ENCOUNTER — Other Ambulatory Visit (HOSPITAL_BASED_OUTPATIENT_CLINIC_OR_DEPARTMENT_OTHER): Payer: Self-pay

## 2024-08-06 NOTE — Telephone Encounter (Signed)
 I already have been in communication with surgeon and letter sent out. Hold Xarelto  3 days and Plavix  7 days and start ASA 81 mg upon holding Plavix . I had earlier sent this message to the staff also

## 2024-08-06 NOTE — Telephone Encounter (Signed)
 Please also see the letter section for pre op, but messaged surgeon for 7 day hold as they requested and I am okay with that

## 2024-08-07 ENCOUNTER — Other Ambulatory Visit (HOSPITAL_BASED_OUTPATIENT_CLINIC_OR_DEPARTMENT_OTHER): Payer: Self-pay

## 2024-08-07 ENCOUNTER — Ambulatory Visit: Payer: Medicare (Managed Care) | Admitting: Medical

## 2024-08-07 VITALS — BP 122/80 | HR 70 | Temp 97.6°F | Resp 15 | Ht 68.0 in | Wt 222.0 lb

## 2024-08-07 DIAGNOSIS — E559 Vitamin D deficiency, unspecified: Secondary | ICD-10-CM

## 2024-08-07 DIAGNOSIS — E538 Deficiency of other specified B group vitamins: Secondary | ICD-10-CM

## 2024-08-07 DIAGNOSIS — R739 Hyperglycemia, unspecified: Secondary | ICD-10-CM

## 2024-08-07 DIAGNOSIS — H538 Other visual disturbances: Secondary | ICD-10-CM

## 2024-08-07 DIAGNOSIS — M25561 Pain in right knee: Secondary | ICD-10-CM

## 2024-08-07 DIAGNOSIS — G4730 Sleep apnea, unspecified: Secondary | ICD-10-CM

## 2024-08-07 DIAGNOSIS — G8929 Other chronic pain: Secondary | ICD-10-CM

## 2024-08-07 DIAGNOSIS — R319 Hematuria, unspecified: Secondary | ICD-10-CM

## 2024-08-07 DIAGNOSIS — I2510 Atherosclerotic heart disease of native coronary artery without angina pectoris: Secondary | ICD-10-CM

## 2024-08-07 MED ORDER — VITAMIN D (ERGOCALCIFEROL) 1.25 MG (50000 UNIT) PO CAPS
50000.0000 [IU] | ORAL_CAPSULE | ORAL | 3 refills | Status: AC
  Filled 2024-08-07 – 2024-08-29 (×2): qty 12, 84d supply, fill #0

## 2024-08-07 NOTE — Patient Instructions (Signed)
 Coronary artery disease Managed with Crestor , Xarelto , losartan , fenofibrate , Zetia , and Plavix . LDL goal is 55 mg/dL. Recent increase in Crestor  to 20 mg to achieve this goal. Cardiologist advised discontinuation of nitroglycerin  due to potential allergic reaction. - Continue Crestor  20 mg daily. - Continue Xarelto , losartan , fenofibrate , Zetia , and Plavix  as prescribed. - Discontinued nitroglycerin  as advised by cardiologist. - Advised to seek emergency care for significant chest pain.  Atrial fibrillation Managed with Xarelto . - Continue Xarelto  as prescribed.  Right knee osteoarthritis Significant improvement in symptoms due to weight loss. Surgery scheduled for January 8th, but considering cancellation due to improved symptoms and weight loss. Orthopedist agrees with potential delay. - Will consider canceling knee surgery if symptoms continue to improve.  Type 2 diabetes mellitus A1c due for re-evaluation after December 22nd. - Ordered A1c test after December 22nd.  Hematuria Intermittent hematuria noted, but urologist is not concerned.  Blurred vision Difficulty obtaining an earlier appointment. Appointment scheduled for May 27th. Insurance requires diabetic eye exam. - Referred to optometrist in [place] for earlier appointment. - Ensure diabetic eye exam is completed as required by insurance.  Vitamin D  deficiency Managed with prescription ergocalciferol  50,000 units weekly and over-the-counter vitamin D3. Levels previously improved with this regimen. - Continue ergocalciferol  50,000 units weekly. - Continue over-the-counter vitamin D3 supplementation. - Ordered vitamin D  level test.  Vitamin B12 deficiency Managed with over-the-counter supplementation. Previous level was high, but supplementation was reduced. - Ordered vitamin B12 level test.  sleep apnea -pt feels like his cpap device not working as used to because of fitting poorly and feels like excessive pressure.   - Placed to see neurologist sooner Dr. Dedra Dohmeir who is managing his cpap.  General health maintenance Colonoscopy deferred due to recent endoscopy and stent placement. Awaiting six-month interval before proceeding. - Deferred colonoscopy until six months post-endoscopy.  Follow up date to be determined after lab review

## 2024-08-07 NOTE — Progress Notes (Signed)
 Subjective:    Patient ID: Richard Walker, male    DOB: 09-04-1955, 68 y.o.   MRN: [id number]  HPI   Pt in for follow up.   Last office visit summary below.    Possible allergic reaction to nitroglycerin  Recent tongue swelling and dysphagia post-nitroglycerin  suggestive of allergy . Symptoms improved with IV Decadron  and Benadryl . - Referred to allergist for evaluation of nitroglycerin  allergy . - Referred to cardiologist for further evaluation and management.   Atherosclerotic heart disease of native coronary artery Chest pain episode post-nitroglycerin  use. No myocardial infarction history. Recent negative MRI for stroke. - Referred to cardiologist for follow-up and management.   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 Using vitamin E for management. - Recommended Mederma or Scarzone for scar management.    Hyperlipidemia Cholesterol controlled with rosuvastatin  and Zetia . Fenofibrate  discontinued. - Continue rosuvastatin  10 mg once daily. - Continue Zetia  once daily.   Vitamin D  deficiency Supplementation required. Previous prescription for 50,000 IU weekly. - Refilled vitamin D  50,000 IU weekly.   Elevated white blood cell count Slight elevation, likely stress-related. - Ordered repeat CBC to monitor.   Hyperglycemia Recent elevated glucose levels, possibly stress-related. A1c previously 6.2%. - Continue metformin  twice daily. - Monitor blood glucose levels.   Gastroesophageal reflux disease GERD managed with pantoprazole . Recent EGD unremarkable. - Continue pantoprazole  as prescribed.   Follow up date to be after lab review  Richard Walker is a 68 year old male with coronary artery disease and atrial fibrillation who presents for follow-up on multiple health issues.  He has coronary artery disease and atrial fibrillation treated with Xarelto , losartan , fenofibrate , Zetia , Plavix , and Crestor . His cardiologist  recently increased Crestor  from 10 mg to 20 mg to target LDL around 55 after a stent.  He has right knee osteoarthritis with improved pain and function after more than 50 pounds of weight loss and is considering canceling the previously planned knee surgery. He is taking Zepbound , recently increased from 7.5 mg to 10 mg, to support further weight loss, with a goal of losing an additional 50 pounds.  He reports several months of blurred vision and needs an earlier optometry appointment to meet insurance requirements for a diabetic eye exam.  He has intermittent hematuria that urology has not found concerning   He is managing diabetes and is due for an A1c test after August 13, 2024. He continues vitamin D  supplementation for low levels and uses over-the-counter B12, which he reduced after previously elevated B12 levels.    Review of Systems  Constitutional:  Negative for chills, fatigue and fever.  Respiratory:  Negative for chest tightness, shortness of breath and wheezing.   Cardiovascular:  Negative for chest pain and palpitations.  Gastrointestinal:  Negative for abdominal pain and constipation.  Genitourinary:  Negative for dysuria.  Musculoskeletal:        See hpi  Skin:  Negative for rash.  Neurological:  Negative for dizziness, weakness and light-headedness.  Hematological:  Negative for adenopathy.  Psychiatric/Behavioral:  Negative for behavioral problems and decreased concentration. The patient is not nervous/anxious.      Past Medical History:  Diagnosis Date   Arthritis    DDD (degenerative disc disease), lumbar    L3/L4   Diverticulosis    Dysrhythmia    SVT s/p ablation   Dysrhythmia    PAF   GERD (gastroesophageal reflux disease)    Hyperlipidemia    Low sperm motility  Myocardial infarction (HCC)    Pre-diabetes    Sleep apnea    uses cpap    SVT (supraventricular tachycardia)      Social History   Socioeconomic History   Marital status: Married     Spouse name: Writer   Number of children: 4   Years of education: Not on file   Highest education level: Associate degree: occupational, scientist, product/process development, or vocational program  Occupational History   Occupation: retired  Tobacco Use   Smoking status: Former    Current packs/day: 0.00    Average packs/day: 1 pack/day for 10.0 years (10.0 ttl pk-yrs)    Types: Cigarettes    Start date: 26    Quit date: 1992    Years since quitting: 33.9    Passive exposure: Past   Smokeless tobacco: Never   Tobacco comments:    Uses Hookah Pipe occasionally  Vaping Use   Vaping status: Never Used  Substance and Sexual Activity   Alcohol use: No   Drug use: No   Sexual activity: Yes  Other Topics Concern   Not on file  Social History Narrative   Marital Status: Married (Feryal)Children:  4 (3 Sons/1 Daughter)Pets:  None Living Situation: Lives with spouse and 4 childrenOrigin:  He was born in [place].Occupation: Production Designer, 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  Alcohol Use:  NoneDrug Use:  NoneDiet:  RegularExercise:  NoneHobbies:  Cards   Retired    Social Drivers of Health   Tobacco Use: Medium Risk (08/04/2024)   Patient History    Smoking Tobacco Use: Former    Smokeless Tobacco Use: Never    Passive Exposure: Past  Physicist, Medical Strain: High Risk (06/11/2024)   Overall Financial Resource Strain (CARDIA)    Difficulty of Paying Living Expenses: Hard  Food Insecurity: Food Insecurity Present (06/25/2024)   Epic    Worried About Programme Researcher, Broadcasting/film/video in the Last Year: Sometimes true    Ran Out of Food in the Last Year: Sometimes true  Transportation Needs: No Transportation Needs (06/25/2024)   Epic    Lack of Transportation (Medical): No    Lack of Transportation (Non-Medical): No  Physical Activity: Insufficiently Active (06/11/2024)   Exercise Vital Sign    Days of Exercise  per Week: 2 days    Minutes of Exercise per Session: 30 min  Stress: Stress Concern Present (06/11/2024)   [employer] of Occupational Health - Occupational Stress Questionnaire    Feeling of Stress: To some extent  Social Connections: Unknown (06/25/2024)   Social Connection and Isolation Panel    Frequency of Communication with Friends and Family: More than three times a week    Frequency of Social Gatherings with Friends and Family: Once a week    Attends Religious Services: 1 to 4 times per year    Active Member of [employer] or Organizations: Not on file    Attends Banker Meetings: Not on file    Marital Status: Married  Intimate Partner Violence: Not At Risk (06/25/2024)   Epic    Fear of Current or Ex-Partner: No    Emotionally Abused: No    Physically Abused: No    Sexually Abused: No  Depression (PHQ2-9): Medium Risk (06/12/2024)   Depression (PHQ2-9)    PHQ-2 Score: 9  Alcohol Screen: Low Risk (01/17/2024)   Alcohol Screen    Last Alcohol Screening Score (AUDIT): 0  Housing: High Risk (06/25/2024)  Epic    Unable to Pay for Housing in the Last Year: Yes    Number of Times Moved in the Last Year: 0    Homeless in the Last Year: No  Utilities: Not At Risk (06/25/2024)   Epic    Threatened with loss of utilities: No  Health Literacy: Adequate Health Literacy (01/18/2024)   B1300 Health Literacy    Frequency of need for help with medical instructions: Never    Past Surgical History:  Procedure Laterality Date   AMPUTATION Left 07/15/2018   Procedure: . REPAIR OF LEFT INDEX AND LONG FINGER BY FUSION /  REPAIR OF RING FINGER;  Surgeon: Sebastian Lenis, MD;  Location: [facility] OR;  Service: Orthopedics;  Laterality: Left;   BACK SURGERY     BLALOCK PROCEDURE     Ablation   CARDIAC CATHETERIZATION N/A 12/22/2015   Procedure: Left Heart Cath and Coronary Angiography;  Surgeon: Peter M Jordan, MD;  Location: [facility] INVASIVE CV LAB;  Service: Cardiovascular;  Laterality:  N/A;   CARDIAC ELECTROPHYSIOLOGY STUDY AND ABLATION     CORONARY STENT INTERVENTION N/A 05/01/2024   Procedure: CORONARY STENT INTERVENTION;  Surgeon: Ladona Heinz, MD;  Location: [facility] INVASIVE CV LAB;  Service: Cardiovascular;  Laterality: N/A;   ELECTROPHYSIOLOGIC STUDY N/A 02/19/2016   Procedure: SVT Ablation;  Surgeon: Danelle LELON Birmingham, MD;  Location: [facility] INVASIVE CV LAB;  Service: Cardiovascular;  Laterality: N/A;   ESOPHAGOGASTRODUODENOSCOPY N/A 06/25/2024   Procedure: EGD (ESOPHAGOGASTRODUODENOSCOPY);  Surgeon: Albertus Heinz HERO, MD;  Location: [facility] ENDOSCOPY;  Service: Gastroenterology;  Laterality: N/A;   HAND SURGERY     LEFT HEART CATH AND CORONARY ANGIOGRAPHY N/A 05/01/2024   Procedure: LEFT HEART CATH AND CORONARY ANGIOGRAPHY;  Surgeon: Ladona Heinz, MD;  Location: [facility] INVASIVE CV LAB;  Service: Cardiovascular;  Laterality: N/A;   septal reconstruction  2017   TOTAL KNEE ARTHROPLASTY Left 03/27/2024   Procedure: ARTHROPLASTY, KNEE, TOTAL, LEFT;  Surgeon: Addie Cordella Hamilton, MD;  Location: [facility] OR;  Service: Orthopedics;  Laterality: Left;   TURBINATE REDUCTION Bilateral 07/2016   WISDOM TOOTH EXTRACTION      Family History  Problem Relation Age of Onset   Stroke Mother    Heart disease Mother    High Cholesterol Mother    Sleep apnea Father    Hypertension Father    Food Allergy  Son    Colon cancer Neg Hx    Stomach cancer Neg Hx    Allergic rhinitis Neg Hx    Angioedema Neg Hx    Asthma Neg Hx    Eczema Neg Hx    Immunodeficiency Neg Hx    Urticaria Neg Hx     Allergies[1]  Medications Ordered Prior to Encounter[2]  BP 122/80   Pulse 70   Temp 97.6 F (36.4 C) (Oral)   Resp 15   Ht 5' 8 (1.727 m)   Wt 222 lb (100.7 kg)   SpO2 97%   BMI 33.75 kg/m          Objective:   Physical Exam  General Mental Status- Alert. General Appearance- Not in acute distress.   Skin General: Color- Normal Color. Moisture- Normal Moisture.  Neck  No JVD.  Chest and Lung  Exam Auscultation: Breath Sounds:-CTA  Cardiovascular Auscultation:Rythm- RRR Murmurs & Other Heart Sounds:Auscultation of the heart reveals- No Murmurs.  Abdomen Inspection:-Inspeection Normal. Palpation/Percussion:Note:No mass. Palpation and Percussion of the abdomen reveal- Non Tender, Non Distended + BS, no rebound or guarding.   Neurologic Cranial Nerve  exam:- CN III-XII intact(No nystagmus), symmetric smile. Strength:- 5/5 equal and symmetric strength both upper and lower extremities.       Assessment & Plan:  Coronary artery disease Managed with Crestor , Xarelto , losartan , fenofibrate , Zetia , and Plavix . LDL goal is 55 mg/dL. Recent increase in Crestor  to 20 mg to achieve this goal. Cardiologist advised discontinuation of nitroglycerin  due to potential allergic reaction. - Continue Crestor  20 mg daily. - Continue Xarelto , losartan , fenofibrate , Zetia , and Plavix  as prescribed. - Discontinued nitroglycerin  as advised by cardiologist. - Advised to seek emergency care for significant chest pain.  Atrial fibrillation Managed with Xarelto . - Continue Xarelto  as prescribed.  Right knee osteoarthritis Significant improvement in symptoms due to weight loss. Surgery scheduled for January 8th, but considering cancellation due to improved symptoms and weight loss. Orthopedist agrees with potential delay. - Will consider canceling knee surgery if symptoms continue to improve.  Type 2 diabetes mellitus A1c due for re-evaluation after December 22nd. - Ordered A1c test after December 22nd.  Hematuria Intermittent hematuria noted, but urologist is not concerned.  Blurred vision Difficulty obtaining an earlier appointment. Appointment scheduled for May 27th. Insurance requires diabetic eye exam. - Referred to optometrist in [place] for earlier appointment. - Ensure diabetic eye exam is completed as required by insurance.  Vitamin D  deficiency Managed with prescription  ergocalciferol  50,000 units weekly and over-the-counter vitamin D3. Levels previously improved with this regimen. - Continue ergocalciferol  50,000 units weekly. - Continue over-the-counter vitamin D3 supplementation. - Ordered vitamin D  level test.  Vitamin B12 deficiency Managed with over-the-counter supplementation. Previous level was high, but supplementation was reduced. - Ordered vitamin B12 level test.  sleep apnea -pt feels like his cpap device not working as used to because of fitting poorly and feels like excessive pressure.  - Placed to see neurologist sooner Dr. Dedra Dohmeir who is managing his cpap.  General health maintenance Colonoscopy deferred due to recent endoscopy and stent placement. Awaiting six-month interval before proceeding. - Deferred colonoscopy until six months post-endoscopy.  Follow up date to be determined after lab review  Dallas Maxwell, PA-C   I personally spent a total of 48 minutes in the care of the patient today including performing a medically appropriate exam/evaluation, counseling and educating, placing orders, and documenting clinical information in the EHR.     [1]  Allergies Allergen Reactions   Nitroglycerin  Other (See Comments)    Tongue swelling and difficulty swallowing. Hospitalized and underwent EGD in 06/2024   Dust Mite Extract    Statins     Muscle aches   Tree Extract Other (See Comments)    Intolerance Grass  [2]  Current Outpatient Medications on File Prior to Visit  Medication Sig Dispense Refill   albuterol  (PROAIR  HFA) 108 (90 Base) MCG/ACT inhaler Inhale 1-2 puffs into the lungs every 6 (six) hours as needed for wheezing or shortness of breath. 24 g 3   azelastine  (ASTELIN ) 0.1 % nasal spray Place 2 sprays into both nostrils 2 (two) times daily as needed for allergies. 90 mL 1   budesonide -glycopyrrolate -formoterol  (BREZTRI  AEROSPHERE) 160-9-4.8 MCG/ACT AERO inhaler Inhale 2 puffs into the lungs in the morning and  at bedtime. 10.7 g 10   cetirizine  (ZYRTEC  ALLERGY ) 10 MG tablet Take 1 tablet (10 mg total) by mouth daily as needed for allergies. 100 tablet 1   cholecalciferol  (VITAMIN D3) 25 MCG (1000 UNIT) tablet Take 3,000-4,000 Units by mouth daily.     clopidogrel  (PLAVIX ) 75 MG tablet Take 1 tablet (75  mg total) by mouth daily. 90 tablet 3   docusate sodium  (COLACE) 100 MG capsule Take 1 capsule (100 mg total) by mouth 2 (two) times daily. 30 capsule 0   ezetimibe  (ZETIA ) 10 MG tablet Take 1 tablet (10 mg total) by mouth daily. 90 tablet 3   fenofibrate  (TRICOR ) 145 MG tablet Take 1 tablet (145 mg total) by mouth daily. 90 tablet 3   gabapentin  (NEURONTIN ) 100 MG capsule Take 1 capsule (100 mg total) by mouth 2 (two) times daily. 42 capsule 0   HYDROcodone -acetaminophen  (NORCO/VICODIN) 5-325 MG tablet Take 1 tablet by mouth every 6 (six) hours as needed for moderate pain (pain score 4-6). 20 tablet 0   losartan  (COZAAR ) 25 MG tablet Take 1 tablet (25 mg total) by mouth daily. 90 tablet 0   Magnesium  250 MG TABS Take 250 mg by mouth once a week.     metFORMIN  (GLUCOPHAGE ) 500 MG tablet Take 1 tablet (500 mg total) by mouth 2 (two) times daily with a meal. (Patient taking differently: Take 500 mg by mouth daily.) 180 tablet 3   methocarbamol  (ROBAXIN ) 500 MG tablet Take 1 tablet (500 mg total) by mouth every 8 (eight) hours as needed for muscle spasms. 30 tablet 0   metoprolol  tartrate (LOPRESSOR ) 25 MG tablet Take one tablet by mouth twice a day.  May take one additional tablet by mouth as needed for breakthrough palpitations. 270 tablet 3   oxyCODONE  (ROXICODONE ) 5 MG immediate release tablet Take 1 tablet (5 mg total) by mouth every 8 (eight) hours as needed for severe pain (pain score 7-10). 30 tablet 0   pantoprazole  (PROTONIX ) 40 MG tablet Take 1 tablet (40 mg total) by mouth daily. 30 tablet 2   polyethylene glycol (MIRALAX / GLYCOLAX) 17 g packet Take 17 g by mouth daily as needed for mild  constipation.     rivaroxaban  (XARELTO ) 20 MG TABS tablet Take 1 tablet (20 mg total) by mouth daily with supper. 30 tablet 3   rosuvastatin  (CRESTOR ) 20 MG tablet Take 1 tablet (20 mg total) by mouth daily. 90 tablet 3   sildenafil  (VIAGRA ) 100 MG tablet Take 1 tablet (100 mg total) by mouth daily as needed for erectile dysfunction. 30 tablet 3   tirzepatide  (ZEPBOUND ) 10 MG/0.5ML Pen Inject 10 mg into the skin once a week. 2 mL 0   Vitamin D , Ergocalciferol , (DRISDOL ) 1.25 MG (50000 UNIT) CAPS capsule Take 1 capsule (50,000 Units total) by mouth every 7 (seven) days. 8 capsule 0   No current facility-administered medications on file prior to visit.

## 2024-08-08 NOTE — Telephone Encounter (Signed)
 Please note there has been communication regarding this patient under staff messages. I relayed updated recommendation from Dr. Ladona to pre-admissions testing provider Allison Zelenak. She reports the the anesthesiologists are still hesitant given DES in September and they are reaching out to Dr. Addie - if case is truly elective then they still prefer that he waits a full 6 months instead (March instead of January). Isaiah will reach out if any additional preop team input needed.

## 2024-08-08 NOTE — Progress Notes (Signed)
 [facility] - CARDIAC REHABILIATION  INDIVIDUALIZED TREATMENT PLAN  ITP Phase: 30 day 08/08/2024   Patient Name:  Richard Walker Diagnoses: 1. S/P coronary artery stent placement         EXERCISE PRESCRIPTION  EXERCISE GOALS: Other Goals: Patient will demonstrate an ability to take their own pulse, Patient will demonstrate an understanding of their exercise prescription, Patient will demonstrate knowledge of safe exercise parameters, Patient will increase the duration and/or intensity of exercise, Increase MET level as appropriate  EXERCISE INTERVENTIONS: Interventions: Educate patient on Dyspnea Scale, Educate patient on exercise prescription, THRR, RPE Scale, and MET level, Educate patient on normal/abnormal response to exercise, Educate patient on pulse-taking techniques, Orient patient to equipment safety guidelines, Participate in warm-up and cool-down  EXERCISE ASSESSMENT: AACVPR Risk: Moderate risk Special Precautions / Protocols Indicated: Continuous telemetry, Diabetic precautions, Orthopedic precautions Type of exercise test: Distance in Feet: 1619.5  AEROBIC CONDITIONING / MODES OF EXERCISE Frequency per day: 1x per day Frequency per week: 3x's weekly Aerobic minutes per session: 31 - 45 RPE/BORG: 4 - 7 THRR Low: 98 THRR High: 116 Determined Predicted HR: 6 MWT, Rest + 10-20 bpm, Rest + 20-30 bpm Rehab Progression: Decrease rest interval duration, Gradually increase frequency and duration Stationary Bike: Yes Stationary Bike Options: Upright Elliptical: Yes (seated) Level: 6 Mets: 4 Watts: 54 NuStep: Yes Level: 5 Mets: 1.9 Watts: 25 Rower: No Track: Yes Feet: 4620 Minutes Walked: 20 Treadmill: No UBE: Yes Level: 6 Mets: 1.9 Watts: 18  STRENGTH TRAINING / EXERCISE PLAN: Resistance Training: Yes Modes: Hand weights, resistance bands, and bodyweight Frequency: 2d/wk Intensity / RPE:  4-7 Repetitions: 10-15 Sets: 1 Limitations or Precautions: hx L. knee surgery 8/25'; Lumbar DDD w/ hx surg Progression: Continue gradaully increasing as tolerated  OTHER TRAINING / EXERCISE PLAN: Balance Training: Yes - Daily dynamic balance exercises in warm-up and cool-down phase of aerobic exercise session Home Exercise: Yes Other Exercise Comments: Patient is doing well at 30-day check in. He reports feeling good. He continues to exercise at the gym when not in class as he prepares for a possible upcoming knee surgery in January. He has already had 1 done recent past. He is doing well with weight loss with an ultimate goal of around 165lbs. He believes increase in exercise in contributing to weight reduction. No new issues reported. Patient has reached 4.0 METs. RPE has been b/t 3-7 out of 10. Exercise HR is within adequate range and he recovers well without issue.  EXERCISE EDUCATION CLASSES:     NUTRITIONAL ASSESSMENT NUTRITION GOALS: Patient Goal: Weight loss Other Goals: Patient will lose 1-2 lbs per week if BMI is greater than 25, Patient will improve nutrition assessment score, Patient will improve serum cholesterol status, Patient will decrease waist circumference Progress towards goal: In progress  NUTRITION INTERVENTIONS: Interventions: Educate the patient on healthy weight loss, Attend nutrition education classes, Monitor weight at each exercise session, Attend individual consultation with the RD, Encourage patient to bring water  bottle to each exercise session, Educate the patient on correlation between heart disease and cholesterol status  NUTRITION ASSESSMENT: Method / Tool: Dietary Risk Assessment Weight Management: Desires dietary guidance Other Nutrition Comments: Pt scored near target on diet survey. Pt is from [place]. 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 He fasts during Ramadan, sun rise to sunset. He eats dates to break his fast. HE also eats pomegranate  often. He worked at [employer] for 27+ years.  He is getting back to how he used to eat, food as it occurs naturally. Pt is participating in the weight management program at [facility]; he takes Zepbound  and working with their MD to adjust. Patient has a long term weight goal of 165lbs.  There were no vitals filed for this visit.  NUTRITION EDUCATION CLASSES:     PSYCHOSOCIAL ASSESSMENT PSYCHOSOCIAL GOALS: Progress towards goal: In progress  PSYCHOSOCIAL INTERVENTIONS: Interventions: Attend stress management class, Instruct in relaxation techniques and coping skills  PSYCHOSOCIAL ASSESSMENT: Quality of Life Score: [employer] Cardiac IV SF 12 Physical Score: 23 Self-Reported Stress: low Current State: No intervention needed Other Psychosocial Comments: Patient adjusting well to the program. He reports his stress as low and does not want a counselor referral. He is retired. He is married and they have five children. He is looking forward to having grandchildren in the future. He is very close to his family and enjoys doing things with them. He has joined a gym and enjoys working out twice a day. He continues to work on his goals to learn healthier lifestyle choices, continue to lose weight, and improve his heart health.  PSYCHOSOCIAL EDUCATION CLASSES:     OTHER CORE COMPONENTS BLOOD PRESSURE: Other Goals: Patient will maintain appropriate blood pressure readings of less than 140/90 BP Interventions: Monitor blood pressure at each session, Educate patient on the importance of taking prescribed blood pressure medications correctly, Educate patient on the importance of a regular exercise program, Educate patient on the importance of maintaining an appropriate diet and weight Progress towards goal: In progress  DIABETES: Other Goals: Patient will maintain an acceptable blood sugar range Interventions: Educate patient on compliance with home blood sugar monitoring as prescribed by  physician, Educate patient on signs and symptoms of hypo- and hyperglycemia, Instruct patient to monitor blood suGar before and after exercise HbA1c: 5.4 (06/12/24) Check BS at home?: Yes Progress towards goal: In progress  HEART FAILURE: EF%: 60-65% (10/29/21)  MEDICATION COMPREHENSION: Other Goals: Patient will demonstrate knowledge of importance of taking meds as evidenced by verbalizing understanding of class, dosage, frequency, and side effects Interventions: Educate the patient on prescription and importance of time of day taken, Educate the patient on the class of medication, action and side effects of medication, Review dosage and frequency of medication with the patient Medications Understood: Yes Progress towards goal: In progress  TOBACCO CESSATION:    Tobacco Use History[1]  OTHER CORE COMPONENTS COMMENTS: Other Comments: Patient adjusting well to the program with good attendance. His biggest improvements have been increasing his levels on the machines and increased strength. He has not had any CP. He has seen his PCP, his weight management doctor, urologist, allergist, orthopedic doctor, and his cardiologist. His Crestor  and Zepbound  were increased. He can use Zyrtec  as needed. He is planning to have knee surgery on January 8th. He monitors his BP and weight at home. His BPs are WNLs. He continues to work on his goals to learn healthier lifestyle choices, continue to lose weight, and improve his heart health.  OTHER CORE COMPONENTS EDUCATION CLASSES:     Signed: Electronically signed by: Randine LITTIE Infante, RN 08/08/2024 11:56 AM       [1] Social History Tobacco Use  Smoking Status Former   Current packs/day: 0.00   Types: Cigarettes   Quit date: 08/23/1990   Years since quitting: 33.9  Smokeless Tobacco Never  Tobacco Comments   He used to do [place] but stopped.

## 2024-08-09 NOTE — Progress Notes (Signed)
 Cardiac rehab notes and rhythms reviewed.

## 2024-08-10 ENCOUNTER — Encounter: Payer: Self-pay | Admitting: Orthopedic Surgery

## 2024-08-10 ENCOUNTER — Other Ambulatory Visit: Payer: Self-pay

## 2024-08-10 DIAGNOSIS — M1711 Unilateral primary osteoarthritis, right knee: Secondary | ICD-10-CM

## 2024-08-10 NOTE — Telephone Encounter (Signed)
 Gel shots typically are not approved if total knee replacement is imminent

## 2024-08-11 ENCOUNTER — Other Ambulatory Visit: Payer: Self-pay | Admitting: Medical

## 2024-08-14 ENCOUNTER — Other Ambulatory Visit (HOSPITAL_BASED_OUTPATIENT_CLINIC_OR_DEPARTMENT_OTHER): Payer: Self-pay

## 2024-08-14 ENCOUNTER — Encounter: Payer: Self-pay | Admitting: Cardiology

## 2024-08-14 MED ORDER — METOPROLOL SUCCINATE ER 50 MG PO TB24
50.0000 mg | ORAL_TABLET | Freq: Every day | ORAL | 3 refills | Status: AC
  Filled 2024-08-14: qty 90, 90d supply, fill #0

## 2024-08-14 MED ORDER — RIVAROXABAN 20 MG PO TABS
20.0000 mg | ORAL_TABLET | Freq: Every day | ORAL | 2 refills | Status: AC
  Filled 2024-08-14: qty 90, 90d supply, fill #0

## 2024-08-15 ENCOUNTER — Other Ambulatory Visit (HOSPITAL_BASED_OUTPATIENT_CLINIC_OR_DEPARTMENT_OTHER): Payer: Self-pay

## 2024-08-15 MED ORDER — CLOPIDOGREL BISULFATE 75 MG PO TABS
75.0000 mg | ORAL_TABLET | Freq: Every day | ORAL | 3 refills | Status: AC
  Filled 2024-08-29: qty 90, 90d supply, fill #0

## 2024-08-15 NOTE — Addendum Note (Signed)
 Addended by: Michiko Lineman L on: 08/15/2024 08:01 AM   Modules accepted: Orders

## 2024-08-20 NOTE — Progress Notes (Signed)
 SABRA

## 2024-08-21 ENCOUNTER — Emergency Department (HOSPITAL_BASED_OUTPATIENT_CLINIC_OR_DEPARTMENT_OTHER): Payer: Medicare (Managed Care)

## 2024-08-21 ENCOUNTER — Observation Stay (HOSPITAL_BASED_OUTPATIENT_CLINIC_OR_DEPARTMENT_OTHER)
Admission: EM | Admit: 2024-08-21 | Discharge: 2024-08-23 | Disposition: A | Discharge status: Home / Self Care | Payer: Medicare (Managed Care) | Attending: Internal Medicine | Admitting: Internal Medicine

## 2024-08-21 ENCOUNTER — Encounter (HOSPITAL_BASED_OUTPATIENT_CLINIC_OR_DEPARTMENT_OTHER): Payer: Self-pay | Admitting: Emergency Medicine

## 2024-08-21 ENCOUNTER — Other Ambulatory Visit: Payer: Self-pay

## 2024-08-21 DIAGNOSIS — Z96652 Presence of left artificial knee joint: Secondary | ICD-10-CM | POA: Diagnosis not present

## 2024-08-21 DIAGNOSIS — R29810 Facial weakness: Secondary | ICD-10-CM | POA: Diagnosis present

## 2024-08-21 DIAGNOSIS — I2510 Atherosclerotic heart disease of native coronary artery without angina pectoris: Secondary | ICD-10-CM | POA: Insufficient documentation

## 2024-08-21 DIAGNOSIS — H538 Other visual disturbances: Secondary | ICD-10-CM | POA: Diagnosis not present

## 2024-08-21 DIAGNOSIS — J42 Unspecified chronic bronchitis: Secondary | ICD-10-CM | POA: Diagnosis not present

## 2024-08-21 DIAGNOSIS — Z955 Presence of coronary angioplasty implant and graft: Secondary | ICD-10-CM | POA: Insufficient documentation

## 2024-08-21 DIAGNOSIS — Z89022 Acquired absence of left finger(s): Secondary | ICD-10-CM | POA: Diagnosis not present

## 2024-08-21 DIAGNOSIS — E7800 Pure hypercholesterolemia, unspecified: Secondary | ICD-10-CM | POA: Insufficient documentation

## 2024-08-21 DIAGNOSIS — K219 Gastro-esophageal reflux disease without esophagitis: Secondary | ICD-10-CM | POA: Insufficient documentation

## 2024-08-21 DIAGNOSIS — Z8673 Personal history of transient ischemic attack (TIA), and cerebral infarction without residual deficits: Secondary | ICD-10-CM | POA: Diagnosis not present

## 2024-08-21 DIAGNOSIS — Z7984 Long term (current) use of oral hypoglycemic drugs: Secondary | ICD-10-CM | POA: Diagnosis not present

## 2024-08-21 DIAGNOSIS — Z7902 Long term (current) use of antithrombotics/antiplatelets: Secondary | ICD-10-CM | POA: Diagnosis not present

## 2024-08-21 DIAGNOSIS — I10 Essential (primary) hypertension: Secondary | ICD-10-CM | POA: Diagnosis not present

## 2024-08-21 DIAGNOSIS — G4733 Obstructive sleep apnea (adult) (pediatric): Secondary | ICD-10-CM | POA: Diagnosis not present

## 2024-08-21 DIAGNOSIS — I480 Paroxysmal atrial fibrillation: Secondary | ICD-10-CM | POA: Insufficient documentation

## 2024-08-21 DIAGNOSIS — E119 Type 2 diabetes mellitus without complications: Secondary | ICD-10-CM | POA: Insufficient documentation

## 2024-08-21 DIAGNOSIS — Z7901 Long term (current) use of anticoagulants: Secondary | ICD-10-CM | POA: Diagnosis not present

## 2024-08-21 DIAGNOSIS — R200 Anesthesia of skin: Secondary | ICD-10-CM | POA: Diagnosis not present

## 2024-08-21 DIAGNOSIS — R29818 Other symptoms and signs involving the nervous system: Principal | ICD-10-CM | POA: Insufficient documentation

## 2024-08-21 DIAGNOSIS — E785 Hyperlipidemia, unspecified: Secondary | ICD-10-CM | POA: Insufficient documentation

## 2024-08-21 DIAGNOSIS — Z79899 Other long term (current) drug therapy: Secondary | ICD-10-CM | POA: Insufficient documentation

## 2024-08-21 LAB — CBC
HCT: 45.3 % (ref 39.0–52.0)
Hemoglobin: 15.1 g/dL (ref 13.0–17.0)
MCH: 27.4 pg (ref 26.0–34.0)
MCHC: 33.3 g/dL (ref 30.0–36.0)
MCV: 82.2 fL (ref 80.0–100.0)
Platelets: 243 K/uL (ref 150–400)
RBC: 5.51 MIL/uL (ref 4.22–5.81)
RDW: 14.3 % (ref 11.5–15.5)
WBC: 8.4 K/uL (ref 4.0–10.5)
nRBC: 0 % (ref 0.0–0.2)

## 2024-08-21 LAB — RESP PANEL BY RT-PCR (RSV, FLU A&B, COVID)  RVPGX2
Influenza A by PCR: NEGATIVE
Influenza B by PCR: NEGATIVE
Resp Syncytial Virus by PCR: NEGATIVE
SARS Coronavirus 2 by RT PCR: NEGATIVE

## 2024-08-21 LAB — DIFFERENTIAL
Abs Immature Granulocytes: 0.03 K/uL (ref 0.00–0.07)
Basophils Absolute: 0 K/uL (ref 0.0–0.1)
Basophils Relative: 0 %
Eosinophils Absolute: 0.1 K/uL (ref 0.0–0.5)
Eosinophils Relative: 1 %
Immature Granulocytes: 0 %
Lymphocytes Relative: 21 %
Lymphs Abs: 1.8 K/uL (ref 0.7–4.0)
Monocytes Absolute: 0.8 K/uL (ref 0.1–1.0)
Monocytes Relative: 9 %
Neutro Abs: 5.7 K/uL (ref 1.7–7.7)
Neutrophils Relative %: 69 %

## 2024-08-21 LAB — COMPREHENSIVE METABOLIC PANEL WITH GFR
ALT: 11 U/L (ref 0–44)
AST: 19 U/L (ref 15–41)
Albumin: 4.5 g/dL (ref 3.5–5.0)
Alkaline Phosphatase: 71 U/L (ref 38–126)
Anion gap: 12 (ref 5–15)
BUN: 24 mg/dL — ABNORMAL HIGH (ref 8–23)
CO2: 23 mmol/L (ref 22–32)
Calcium: 10.2 mg/dL (ref 8.9–10.3)
Chloride: 103 mmol/L (ref 98–111)
Creatinine, Ser: 1.22 mg/dL (ref 0.61–1.24)
GFR, Estimated: >60 mL/min
Glucose, Bld: 86 mg/dL (ref 70–99)
Potassium: 3.9 mmol/L (ref 3.5–5.1)
Sodium: 138 mmol/L (ref 135–145)
Total Bilirubin: 0.5 mg/dL (ref 0.0–1.2)
Total Protein: 7.2 g/dL (ref 6.5–8.1)

## 2024-08-21 LAB — PROTIME-INR
INR: 1.7 — ABNORMAL HIGH (ref 0.8–1.2)
Prothrombin Time: 20.8 s — ABNORMAL HIGH (ref 11.4–15.2)

## 2024-08-21 LAB — TROPONIN T, HIGH SENSITIVITY: Troponin T High Sensitivity: <15 ng/L (ref 0–19)

## 2024-08-21 LAB — CBG MONITORING, ED: Glucose-Capillary: 93 mg/dL (ref 70–99)

## 2024-08-21 LAB — APTT: aPTT: 42 s — ABNORMAL HIGH (ref 24–36)

## 2024-08-21 MED ORDER — IOHEXOL 350 MG/ML SOLN
75.0000 mL | Freq: Once | INTRAVENOUS | Status: AC | PRN
  Administered 2024-08-21: 75 mL via INTRAVENOUS

## 2024-08-21 NOTE — ED Notes (Signed)
 [facility] informed of consult for hospitalist.

## 2024-08-21 NOTE — ED Notes (Signed)
 Pt pleasant and making jokes in the bed. Pt notes he has not felt bad all day and still feels fine.  Pt eager to know results.  Pt notes he will give 6 hours and then he wants to go home back to what he was doing.  Pt is vitally stable.  Rails x2. On monitor, and son at bedside.

## 2024-08-21 NOTE — Consult Note (Signed)
 [facility]   Patient Name:   Richard Walker, Richard Walker Date of Birth:   [birth year] Identification Number:   MRN - [id number] Date of Service:   08/21/2024 21:25:30  Impression:      68 year old male with hypertension, hyperlipidemia, diabetes, coronary artery disease, atrial fibrillation on Xarelto , who was last normal at 9:30 PM last night. He has been having decreased energy with chest pressure and head pressure accompanied by blurred vision as well as transient left facial droop. He is not a thrombolytic candidate as he is on anticoagulation and he is outside the treatment window. Head CT negative for acute abnormalities. CTA head/neck negative for LVO.    PLAN  - continue Xarelto   - MRI brain w/o contrast  - TTE w/bubble  - check a1c and LDL  - neuro to follow   --  Our recommendations are outlined below.  Recommendations:       Stroke/Telemetry Floor       Neuro Checks       Bedside Swallow Eval       DVT Prophylaxis       IV Fluids, Normal Saline       Head of Bed 30 Degrees       Euglycemia and Avoid Hyperthermia (PRN Acetaminophen )    ------------------------------------------------------------------------------  Advanced Imaging: CTA Head and Neck Completed.  LVO:No  Patient is not a candidate for NIR   Metrics: Last Known Well: 08/20/2024 21:30:00 Arrival Time: 08/21/2024 21:16:00 Activation Time: 08/21/2024 21:25:29 Initial Response Time: 08/21/2024 21:28:37 Symptoms: left facial droop. Initial patient interaction: 08/21/2024 21:32:35 NIHSS Assessment Completed: 08/21/2024 21:42:41 Patient is not a candidate for Thrombolytic. Thrombolytic Medical Decision: 08/21/2024 21:33:42 Patient was not deemed candidate for Thrombolytic because of following reasons: LKW outside 4.5 hr window. . Use of NOAC in last 48 hrs. .  CT Head: CT head unremarkable for acute infarction or hemorrhage per Radiology: no acute  abnl  Primary Provider Notified of Diagnostic Impression and Management Plan on: 08/21/2024 22:00:10    ------------------------------------------------------------------------------  History of Present Illness: Patient is a 68 year old Male.  Patient was brought by private transportation with symptoms of left facial droop. 68 year old male with hypertension, hyperlipidemia, diabetes, coronary artery disease, atrial fibrillation on Xarelto , who was last normal at 9:30 PM last night. He awoke this morning and reported decreased energy throughout the day. He also reported pressure on both sides of his head and across his chest. He is complaining of diffuse blurred vision as well. At some point today his son noted that patient had a left facial droop which was also seen by staff in the ER. On my assessment, his face appears symmetric. Stroke Scale is 1 for subtle numbness to light touch of his left face and left leg. No other deficits were noted.   Past Medical History:      Hypertension      Diabetes Mellitus      Hyperlipidemia      Atrial Fibrillation      Coronary Artery Disease  Anticoagulant use:  Yes Xarelto  Antiplatelet use: Yes plavix  Reviewed EMR for current medications  Allergies:  Reviewed  Social History: Drug Use: No  Family History: There is no family history of premature cerebrovascular disease pertinent to this consultation  ROS : 14 Points Review of Systems was performed and was negative except mentioned in HPI.  Past Surgical History: There Is No Surgical History Contributory To Todays Visit    Examination: 1A:  Level of Consciousness - Alert; keenly responsive + 0 1B: Ask Month and Age - Both Questions Right + 0 1C: Blink Eyes & Squeeze Hands - Performs Both Tasks + 0 2: Test Horizontal Extraocular Movements - Normal + 0 3: Test Visual Fields - No Visual Loss + 0 4: Test Facial Palsy (Use Grimace if Obtunded) - Normal symmetry + 0 5A: Test Left Arm  Motor Drift - No Drift for 10 Seconds + 0 5B: Test Right Arm Motor Drift - No Drift for 10 Seconds + 0 6A: Test Left Leg Motor Drift - No Drift for 5 Seconds + 0 6B: Test Right Leg Motor Drift - No Drift for 5 Seconds + 0 7: Test Limb Ataxia (FNF/Heel-Shin) - No Ataxia + 0 8: Test Sensation - Mild-Moderate Loss: Less Sharp/More Dull + 1 9: Test Language/Aphasia - Normal; No aphasia + 0 10: Test Dysarthria - Normal + 0 11: Test Extinction/Inattention - No abnormality + 0  NIHSS Score: 1   Pre-Morbid Modified Rankin Scale: 0 Points = No symptoms at all  Spoke with : er provider I reviewed the available imaging via Rapid and initiated discussion with the primary provider  This consult was conducted in real time using interactive audio and immunologist. Patient was informed of the technology being used for this visit and agreed to proceed. Patient located in hospital and provider located at home/office setting.   Patient is being evaluated for possible acute neurologic impairment and high probability of imminent or life-threatening deterioration. I spent total of 20 minutes providing care to this patient, including time for face to face visit via telemedicine, review of medical records, imaging studies and discussion of findings with providers, the patient and/or family.    Dr Ana Cowing   [facility] For Inpatient follow-up with [facility] physician please call [facility] at [phone]. As we are not an outpatient service for any post hospital discharge needs please contact the hospital for assistance. If you have any questions for the [facility] physicians or need to reconsult for clinical or diagnostic changes please contact us  via [facility] at [phone].  Non-radiologist review of imaging performed to assist with emergent clinical decision-making. Remote physician workstations do not possess the same resolution, calibration, or diagnostic capabilities as hospital-based  radiology reading stations, and formal radiologist read is necessary.   Signature : Brietta Manso

## 2024-08-21 NOTE — ED Provider Notes (Signed)
 " [place] [facility] Provider Note   CSN: [id number] Arrival date & time: 08/21/24  2116     Patient presents with: Facial Droop   Richard Walker is a 68 y.o. male.  {Add pertinent medical, surgical, social history, OB history to HPI:32947} HPI       68 year old male with a history of hyperlipidemia, GERD, SVT, prediabetes, CAD with DES placed 9/9 to mid LAD with Dr. Ladona, paroxysmal atrial fibrillation on Xarelto  who presents with fatigue, headache, chest pressure and left facial droop.  Reports that he was in his normal state of health at 9:30 PM last night when he went to bed.  When he woke up this morning he had sensation of head pressure with blurred vision in his bilateral eyes, feeling of fatigue and chest pressure.  He reports he was laying down, and when he sat back up his son noticed that he had a facial droop.  He had not seen the droop but his son had noticed it.  When tested he did say he has left-sided numbness of his face arm and leg.   Past Medical History:  Diagnosis Date   Arthritis    DDD (degenerative disc disease), lumbar    L3/L4   Diverticulosis    Dysrhythmia    SVT s/p ablation   Dysrhythmia    PAF   GERD (gastroesophageal reflux disease)    Hyperlipidemia    Low sperm motility    Myocardial infarction (HCC)    Pre-diabetes    Sleep apnea    uses cpap    SVT (supraventricular tachycardia)      Prior to Admission medications  Medication Sig Start Date End Date Taking? Authorizing Provider  albuterol  (PROAIR  HFA) 108 (90 Base) MCG/ACT inhaler Inhale 1-2 puffs into the lungs every 6 (six) hours as needed for wheezing or shortness of breath. 10/11/23   Kassie Acquanetta Bradley, MD  azelastine  (ASTELIN ) 0.1 % nasal spray Place 2 sprays into both nostrils 2 (two) times daily as needed for allergies. 07/18/24   Tobie Arleta SQUIBB, MD  budesonide -glycopyrrolate -formoterol  (BREZTRI  AEROSPHERE) 160-9-4.8 MCG/ACT AERO inhaler  Inhale 2 puffs into the lungs in the morning and at bedtime. 05/10/24   Kassie Acquanetta Bradley, MD  cetirizine  (ZYRTEC  ALLERGY ) 10 MG tablet Take 1 tablet (10 mg total) by mouth daily as needed for allergies. 07/18/24   Tobie Arleta SQUIBB, MD  cholecalciferol  (VITAMIN D3) 25 MCG (1000 UNIT) tablet Take 3,000-4,000 Units by mouth daily.    [provider]  clopidogrel  (PLAVIX ) 75 MG tablet Take 1 tablet (75 mg total) by mouth daily. 08/15/24   Ladona Heinz, MD  docusate sodium  (COLACE) 100 MG capsule Take 1 capsule (100 mg total) by mouth 2 (two) times daily. 05/14/24   Magnant, Charles L, PA-C  ezetimibe  (ZETIA ) 10 MG tablet TAKE 1 TABLET(10 MG) BY MOUTH DAILY 08/13/24   Saguier, [place], PA-C  fenofibrate  (TRICOR ) 145 MG tablet Take 1 tablet (145 mg total) by mouth daily. 06/27/24   Saguier, [place], PA-C  gabapentin  (NEURONTIN ) 100 MG capsule Take 1 capsule (100 mg total) by mouth 2 (two) times daily. 07/17/24   Addie Cordella Hamilton, MD  HYDROcodone -acetaminophen  (NORCO/VICODIN) 5-325 MG tablet Take 1 tablet by mouth every 6 (six) hours as needed for moderate pain (pain score 4-6). 07/11/24   Saguier, [place], PA-C  losartan  (COZAAR ) 25 MG tablet Take 1 tablet (25 mg total) by mouth daily. 09/14/23   Saguier, [place], PA-C  Magnesium   250 MG TABS Take 250 mg by mouth once a week.    [provider]  metFORMIN  (GLUCOPHAGE ) 500 MG tablet Take 1 tablet (500 mg total) by mouth 2 (two) times daily with a meal. Patient taking differently: Take 500 mg by mouth daily. 09/14/23   Saguier, [place], PA-C  methocarbamol  (ROBAXIN ) 500 MG tablet Take 1 tablet (500 mg total) by mouth every 8 (eight) hours as needed for muscle spasms. 06/27/24   Saguier, [place], PA-C  metoprolol  succinate (TOPROL -XL) 50 MG 24 hr tablet Take 1 tablet (50 mg total) by mouth at bedtime. Take with or immediately following a meal. 08/14/24 11/12/24  Ladona Heinz, MD  oxyCODONE  (ROXICODONE ) 5 MG immediate release tablet Take 1 tablet (5 mg total)  by mouth every 8 (eight) hours as needed for severe pain (pain score 7-10). 06/18/24   Vernetta Lonni GRADE, MD  pantoprazole  (PROTONIX ) 40 MG tablet Take 1 tablet (40 mg total) by mouth daily. 10/21/23   Saguier, [place], PA-C  polyethylene glycol (MIRALAX / GLYCOLAX) 17 g packet Take 17 g by mouth daily as needed for mild constipation.    [provider]  rivaroxaban  (XARELTO ) 20 MG TABS tablet Take 1 tablet (20 mg total) by mouth daily with supper. 08/14/24   Ladona Heinz, MD  rosuvastatin  (CRESTOR ) 20 MG tablet Take 1 tablet (20 mg total) by mouth daily. 07/25/24 07/25/25  Ladona Heinz, MD  sildenafil  (VIAGRA ) 100 MG tablet Take 1 tablet (100 mg total) by mouth daily as needed for erectile dysfunction. 11/21/23   Waddell Danelle ORN, MD  tirzepatide  (ZEPBOUND ) 10 MG/0.5ML Pen Inject 10 mg into the skin once a week. 08/01/24   Francyne Romano, MD  Vitamin D , Ergocalciferol , (DRISDOL ) 1.25 MG (50000 UNIT) CAPS capsule Take 1 capsule (50,000 Units total) by mouth every 7 (seven) days. 06/27/24   Saguier, [place], PA-C  Vitamin D , Ergocalciferol , (DRISDOL ) 1.25 MG (50000 UNIT) CAPS capsule Take 1 capsule (50,000 Units total) by mouth every 7 (seven) days. 08/07/24   Saguier, [place], PA-C    Allergies: Nitroglycerin , Dust mite extract, Statins, and Tree extract    Review of Systems  Updated Vital Signs BP 106/75 (BP Location: Right Arm)   Pulse 92   Temp 98 F (36.7 C)   Resp 18   Wt 99.3 kg   SpO2 100%   BMI 33.30 kg/m   Physical Exam  (all labs ordered are listed, but only abnormal results are displayed) Labs Reviewed  PROTIME-INR - Abnormal; Notable for the following components:      Result Value   Prothrombin Time 20.8 (*)    INR 1.7 (*)    All other components within normal limits  APTT - Abnormal; Notable for the following components:   aPTT 42 (*)    All other components within normal limits  COMPREHENSIVE METABOLIC PANEL WITH GFR - Abnormal; Notable for the following  components:   BUN 24 (*)    All other components within normal limits  RESP PANEL BY RT-PCR (RSV, FLU A&B, COVID)  RVPGX2  CBC  DIFFERENTIAL  URINE DRUG SCREEN  CBG MONITORING, ED  TROPONIN T, HIGH SENSITIVITY    EKG: None  Radiology: CT ANGIO HEAD NECK W WO CM Result Date: 08/21/2024 EXAM: CTA HEAD AND NECK WITH AND WITHOUT 08/21/2024 09:57:06 PM TECHNIQUE: CTA of the head and neck was performed with and without the administration of intravenous contrast. Multiplanar 2D and/or 3D reformatted images are provided for review. Automated exposure control, iterative reconstruction, and/or  weight based adjustment of the mA/kV was utilized to reduce the radiation dose to as low as reasonably achievable. Stenosis of the internal carotid arteries measured using NASCET criteria. COMPARISON: None available CLINICAL HISTORY: Neuro deficit, acute, stroke suspected. FINDINGS: CTA NECK: AORTIC ARCH AND ARCH VESSELS: Aortic atherosclerosis. No dissection or arterial injury. No significant stenosis of the brachiocephalic or subclavian arteries. CERVICAL CAROTID ARTERIES: Atherosclerotic calcification of both carotid arteries bifurcations without hemodynamically significant stenosis of the internal carotid arteries by NASCET criteria. No dissection or arterial injury. CERVICAL VERTEBRAL ARTERIES: No dissection, arterial injury, or significant stenosis. LUNGS AND MEDIASTINUM: Unremarkable. SOFT TISSUES: No acute abnormality. BONES: No acute abnormality. CTA HEAD: ANTERIOR CIRCULATION: No significant stenosis of the internal carotid arteries. No significant stenosis of the anterior cerebral arteries. No significant stenosis of the middle cerebral arteries. No aneurysm. POSTERIOR CIRCULATION: No significant stenosis of the posterior cerebral arteries. No significant stenosis of the basilar artery. No significant stenosis of the vertebral arteries. No aneurysm. OTHER: No dural venous sinus thrombosis on this  non-dedicated study. IMPRESSION: 1. No large vessel occlusion, hemodynamically significant stenosis, or aneurysm in the head or neck. 2. Atherosclerotic calcification of both carotid artery bifurcations without hemodynamically significant stenosis of the internal carotid arteries. Electronically signed by: Franky Stanford MD 08/21/2024 10:14 PM EST RP Workstation: [id number]   CT HEAD CODE STROKE WO CONTRAST (LKW 0-4.5h, LVO 0-24h) Result Date: 08/21/2024 EXAM: CT HEAD WITHOUT CONTRAST 08/21/2024 09:44:38 PM TECHNIQUE: CT of the head was performed without the administration of intravenous contrast. Automated exposure control, iterative reconstruction, and/or weight based adjustment of the mA/kV was utilized to reduce the radiation dose to as low as reasonably achievable. COMPARISON: 06/24/2024, 08/16/2024. CLINICAL HISTORY: Neuro deficit, acute, stroke suspected. FINDINGS: BRAIN AND VENTRICLES: No acute hemorrhage. No evidence of acute infarct. No hydrocephalus. No extra-axial collection. No mass effect or midline shift. The [place] Stroke Program Early CT Score (ASPECTS) is 10, with a ganglionic score of 7 (caudate, internal capsule, lentiform nucleus, insula, M1-M3) and a supraganglionic score of 3 (M4-M6). A score of 10 indicates no early ischemic changes. ORBITS: No acute abnormality. SINUSES: No acute abnormality. SOFT TISSUES AND SKULL: No acute soft tissue abnormality. No skull fracture. IMPRESSION: 1. No acute intracranial abnormality. 2. ASPECTS 10. Findings reported to Dr. Dreama at 9:49 PM on 08/21/2024. Electronically signed by: Franky Stanford MD 08/21/2024 09:49 PM EST RP Workstation: [id number]    {Document cardiac monitor, telemetry assessment procedure when appropriate:32947} Procedures   Medications Ordered in the ED  iohexol  (OMNIPAQUE ) 350 MG/ML injection 75 mL (75 mLs Intravenous Contrast Given 08/21/24 2159)      {Click here for ABCD2, HEART and other calculators REFRESH Note  before signing:1}                              Medical Decision Making Amount and/or Complexity of Data Reviewed Labs: ordered. Radiology: ordered.  Risk Prescription drug management.   68 year old male with a history of hyperlipidemia, GERD, SVT, prediabetes, CAD with DES placed 9/9 to mid LAD with Dr. Ladona, paroxysmal atrial fibrillation on Xarelto  who presents with fatigue, headache, chest pressure and left facial droop.  A code stroke was activated in triage, he was noted to have a left facial droop sparing the forehead left-sided numbness.    {Document critical care time when appropriate  Document review of labs and clinical decision tools ie CHADS2VASC2, etc  Document your independent review of radiology images  and any outside records  Document your discussion with family members, caretakers and with consultants  Document social determinants of health affecting pt's care  Document your decision making why or why not admission, treatments were needed:32947:::1}   Final diagnoses:  None    ED Discharge Orders     None        "

## 2024-08-21 NOTE — ED Triage Notes (Signed)
 Pt with son- son reports L facial droop, noticed 1 hour ago, last seen appx 3 hours ago, son unsure as to if droop present at that time due to pt lying down.   Pt c/o fatigue, dizziness, chest pain, head pressure,  x 1 day.   AOx4 Cbg 93 in triage.   Elink activated 2125

## 2024-08-22 ENCOUNTER — Observation Stay (HOSPITAL_COMMUNITY): Payer: Medicare (Managed Care)

## 2024-08-22 DIAGNOSIS — R29818 Other symptoms and signs involving the nervous system: Principal | ICD-10-CM

## 2024-08-22 DIAGNOSIS — R200 Anesthesia of skin: Secondary | ICD-10-CM | POA: Diagnosis present

## 2024-08-22 DIAGNOSIS — Z743 Need for continuous supervision: Secondary | ICD-10-CM | POA: Diagnosis not present

## 2024-08-22 DIAGNOSIS — R404 Transient alteration of awareness: Secondary | ICD-10-CM | POA: Diagnosis not present

## 2024-08-22 DIAGNOSIS — R202 Paresthesia of skin: Secondary | ICD-10-CM | POA: Diagnosis present

## 2024-08-22 DIAGNOSIS — G4489 Other headache syndrome: Secondary | ICD-10-CM | POA: Diagnosis not present

## 2024-08-22 DIAGNOSIS — R29810 Facial weakness: Secondary | ICD-10-CM | POA: Diagnosis present

## 2024-08-22 LAB — URINE DRUG SCREEN
Amphetamines: NEGATIVE
Barbiturates: NEGATIVE
Benzodiazepines: NEGATIVE
Cocaine: NEGATIVE
Fentanyl: NEGATIVE
Methadone Scn, Ur: NEGATIVE
Opiates: NEGATIVE
Tetrahydrocannabinol: NEGATIVE

## 2024-08-22 LAB — HEMOGLOBIN A1C
Hgb A1c MFr Bld: 5.4 % (ref 4.8–5.6)
Mean Plasma Glucose: 108.28 mg/dL

## 2024-08-22 MED ORDER — METFORMIN HCL 500 MG PO TABS
500.0000 mg | ORAL_TABLET | Freq: Every day | ORAL | Status: DC
  Administered 2024-08-22: 500 mg via ORAL
  Filled 2024-08-22: qty 1

## 2024-08-22 MED ORDER — RIVAROXABAN 20 MG PO TABS
20.0000 mg | ORAL_TABLET | Freq: Every day | ORAL | Status: DC
  Administered 2024-08-22: 20 mg via ORAL
  Filled 2024-08-22: qty 1

## 2024-08-22 MED ORDER — LOSARTAN POTASSIUM 50 MG PO TABS
25.0000 mg | ORAL_TABLET | Freq: Every day | ORAL | Status: DC
  Administered 2024-08-22: 25 mg via ORAL
  Filled 2024-08-22: qty 1

## 2024-08-22 MED ORDER — PANTOPRAZOLE SODIUM 40 MG PO TBEC
40.0000 mg | DELAYED_RELEASE_TABLET | Freq: Every day | ORAL | Status: DC
  Administered 2024-08-22 – 2024-08-23 (×2): 40 mg via ORAL
  Filled 2024-08-22 (×2): qty 1

## 2024-08-22 MED ORDER — METOPROLOL SUCCINATE ER 50 MG PO TB24: 50.0000 mg | ORAL_TABLET | Freq: Every day | ORAL | Status: DC

## 2024-08-22 MED ORDER — GABAPENTIN 100 MG PO CAPS
100.0000 mg | ORAL_CAPSULE | Freq: Two times a day (BID) | ORAL | Status: DC
  Administered 2024-08-22 – 2024-08-23 (×4): 100 mg via ORAL
  Filled 2024-08-22 (×4): qty 1

## 2024-08-22 MED ORDER — ALBUTEROL SULFATE HFA 108 (90 BASE) MCG/ACT IN AERS: 1.0000 | INHALATION_SPRAY | Freq: Four times a day (QID) | RESPIRATORY_TRACT | Status: DC | PRN

## 2024-08-22 MED ORDER — ACETAMINOPHEN 160 MG/5ML PO SOLN: 650.0000 mg | ORAL | Status: DC | PRN

## 2024-08-22 MED ORDER — STROKE: EARLY STAGES OF RECOVERY BOOK
Freq: Once | Status: AC
  Filled 2024-08-22: qty 1

## 2024-08-22 MED ORDER — POLYETHYLENE GLYCOL 3350 17 G PO PACK: 17.0000 g | PACK | Freq: Every day | ORAL | Status: DC | PRN

## 2024-08-22 MED ORDER — EZETIMIBE 10 MG PO TABS
10.0000 mg | ORAL_TABLET | Freq: Every day | ORAL | Status: DC
  Administered 2024-08-23: 10 mg via ORAL
  Filled 2024-08-22: qty 1

## 2024-08-22 MED ORDER — DOCUSATE SODIUM 100 MG PO CAPS
100.0000 mg | ORAL_CAPSULE | Freq: Two times a day (BID) | ORAL | Status: DC
  Administered 2024-08-22 – 2024-08-23 (×3): 100 mg via ORAL
  Filled 2024-08-22 (×3): qty 1

## 2024-08-22 MED ORDER — AZELASTINE HCL 0.1 % NA SOLN: 2.0000 | Freq: Two times a day (BID) | NASAL | Status: DC | PRN

## 2024-08-22 MED ORDER — LORATADINE 10 MG PO TABS
10.0000 mg | ORAL_TABLET | Freq: Every day | ORAL | Status: DC
  Administered 2024-08-22 – 2024-08-23 (×2): 10 mg via ORAL
  Filled 2024-08-22 (×2): qty 1

## 2024-08-22 MED ORDER — BUDESON-GLYCOPYRROL-FORMOTEROL 160-9-4.8 MCG/ACT IN AERO
2.0000 | INHALATION_SPRAY | Freq: Two times a day (BID) | RESPIRATORY_TRACT | Status: DC
  Administered 2024-08-22 – 2024-08-23 (×2): 2 via RESPIRATORY_TRACT
  Filled 2024-08-22: qty 5.9

## 2024-08-22 MED ORDER — ONDANSETRON HCL 4 MG/2ML IJ SOLN: 4.0000 mg | Freq: Four times a day (QID) | INTRAMUSCULAR | Status: DC | PRN

## 2024-08-22 MED ORDER — ROSUVASTATIN CALCIUM 20 MG PO TABS
20.0000 mg | ORAL_TABLET | Freq: Every day | ORAL | Status: DC
  Administered 2024-08-23: 20 mg via ORAL
  Filled 2024-08-22: qty 1

## 2024-08-22 MED ORDER — ACETAMINOPHEN 650 MG RE SUPP: 650.0000 mg | RECTAL | Status: DC | PRN

## 2024-08-22 MED ORDER — CLOPIDOGREL BISULFATE 75 MG PO TABS
75.0000 mg | ORAL_TABLET | Freq: Every day | ORAL | Status: DC
  Administered 2024-08-22 – 2024-08-23 (×2): 75 mg via ORAL
  Filled 2024-08-22 (×2): qty 1

## 2024-08-22 MED ORDER — GADOBUTROL 1 MMOL/ML IV SOLN
10.0000 mL | Freq: Once | INTRAVENOUS | Status: AC | PRN
  Administered 2024-08-22: 10 mL via INTRAVENOUS

## 2024-08-22 MED ORDER — ALBUTEROL SULFATE (2.5 MG/3ML) 0.083% IN NEBU: 3.0000 mL | INHALATION_SOLUTION | Freq: Four times a day (QID) | RESPIRATORY_TRACT | Status: DC | PRN

## 2024-08-22 MED ORDER — ACETAMINOPHEN 325 MG PO TABS
650.0000 mg | ORAL_TABLET | ORAL | Status: DC | PRN
  Administered 2024-08-22: 650 mg via ORAL
  Filled 2024-08-22: qty 2

## 2024-08-22 MED ORDER — FENOFIBRATE 160 MG PO TABS
160.0000 mg | ORAL_TABLET | Freq: Every day | ORAL | Status: DC
  Administered 2024-08-23: 160 mg via ORAL
  Filled 2024-08-22: qty 1

## 2024-08-22 MED ORDER — LOSARTAN POTASSIUM 50 MG PO TABS
25.0000 mg | ORAL_TABLET | Freq: Every day | ORAL | Status: DC
  Administered 2024-08-23: 25 mg via ORAL
  Filled 2024-08-22: qty 1

## 2024-08-22 NOTE — ED Notes (Signed)
 Pt awoken for NIH assessment. Pt notes that he feels completely normal and to baseline.  Pt comfortable and pain free at this time.  VSS.

## 2024-08-22 NOTE — Progress Notes (Signed)
" °   08/22/24 2246  BiPAP/CPAP/SIPAP  $ Non-Invasive Home Ventilator  Initial  $ Face Mask Large  Yes  BiPAP/CPAP/SIPAP Pt Type Adult (Patient prefers self placement when ready)  BiPAP/CPAP/SIPAP Resmed  Mask Type Full face mask  Dentures removed? Not applicable  Mask Size Large  FiO2 (%) 21 %  Patient Home Machine No  Patient Home Mask No  Patient Home Tubing No  Auto Titrate Yes  Minimum cmH2O 6 cmH2O  Maximum cmH2O 12 cmH2O  Device Plugged into RED Power Outlet Yes  BiPAP/CPAP /SiPAP Vitals  Resp 15  MEWS Score/Color  MEWS Score 0  MEWS Score Color Green    "

## 2024-08-22 NOTE — ED Notes (Signed)
 Spoke with EDP Dr. Doretha. She advised that pt is able to eat. Pt requested to order food be delivered.

## 2024-08-22 NOTE — ED Notes (Signed)
 Hemoglobin A1c and LDL lab draws will be postponed till tansfer or morning, these are not stat labs and not ran on site here at this facility.

## 2024-08-22 NOTE — ED Notes (Signed)
[facility] at bedside to assume care of patient. 

## 2024-08-22 NOTE — ED Notes (Signed)
 Pt has no need to void but will collect us  a UDS sample when able to void. Pt is laying in bed with rails x2.  Call bell in reach of pt.

## 2024-08-22 NOTE — Plan of Care (Signed)

## 2024-08-22 NOTE — H&P (Signed)
 " History and Physical    Richard Walker FMW:[id number] DOB: Dec 20, 1955 DOA: 08/21/2024  PCP: Dorina Loving, PA-C  Patient coming from: Home  I have personally briefly reviewed patient's old medical records in [facility]  Chief Complaint: Left facial droop  HPI: Richard Walker is a 68 y.o. male with medical history significant for CAD s/p DES to LAD (05/01/2024), PAF on Xarelto , SVT s/p ablation, HTN, HLD, GERD, prediabetes, chronic bronchitis, OSA on CPAP who presented to the ED evening of 08/21/2024 for evaluation of left facial droop.  Patient reports that he has been on Zepbound  prescribed by his primary care physician.  He says the dose was increased from 7.5 mg to 10 mg this past Monday (08/20/2024).  He was advised on potential adverse effects.  Patient states that after increasing the dosing he had been feeling generally unwell and fatigued.  Yesterday night around 8 PM his son noticed that he had drooping of his left lower face.  He did not have any difficulty with speech or slurring his words.  Patient notes that he had associated headache and blurry vision.  He also recalls having some numbness to his left lower face when compared to the right.  His son took him immediately to the emergency department.  Patient states that his facial drooping was still present on arrival but symptoms resolved after a few hours.  He does note some continued mild blurring of his vision.  He has not had any weakness in his extremities.  He reports adherence to his Xarelto  and Plavix .  [facility] ED Course  Labs/Imaging on admission: I have personally reviewed following labs and imaging studies.  Initial vitals showed BP 106/75, pulse 92, RR 18, temp 98.0 F, SpO2 99% on room air.  Labs showed WBC 8.4, hemoglobin 15.1, platelets 243, sodium 138, potassium 3.9, bicarb 23, BUN 24, creatinine 1.22, serum glucose 86, LFTs within normal limits, troponin T <15.  SARS-CoV-2, influenza, RSV PCR  negative.  UDS negative.  Hemoglobin A1c 5.4%.  CT head without contrast negative for acute intracranial abnormality.  CTA head/neck negative for LVO, hemodynamically significant stenosis, or aneurysm of the head or neck.  Teleneurology (Dr. Ana Cowing) were consulted and recommended admission for CVA workup.  Patient was restarted on home medications including Xarelto , Plavix , losartan , Toprol -XL.  The hospitalist service was consulted for admission.  Patient arrived to the floor at [facility] evening of 08/22/2024.  Review of Systems: All systems reviewed and are negative except as documented in history of present illness above.   Past Medical History:  Diagnosis Date   Arthritis    DDD (degenerative disc disease), lumbar    L3/L4   Diverticulosis    Dysrhythmia    SVT s/p ablation   Dysrhythmia    PAF   GERD (gastroesophageal reflux disease)    Hyperlipidemia    Low sperm motility    Myocardial infarction (HCC)    Pre-diabetes    Sleep apnea    uses cpap    SVT (supraventricular tachycardia)     Past Surgical History:  Procedure Laterality Date   AMPUTATION Left 07/15/2018   Procedure: . REPAIR OF LEFT INDEX AND LONG FINGER BY FUSION /  REPAIR OF RING FINGER;  Surgeon: Sebastian Lenis, MD;  Location: [facility] OR;  Service: Orthopedics;  Laterality: Left;   BACK SURGERY     BLALOCK PROCEDURE     Ablation   CARDIAC CATHETERIZATION N/A 12/22/2015   Procedure: Left  Heart Cath and Coronary Angiography;  Surgeon: Peter M Jordan, MD;  Location: [facility] INVASIVE CV LAB;  Service: Cardiovascular;  Laterality: N/A;   CARDIAC ELECTROPHYSIOLOGY STUDY AND ABLATION     CORONARY STENT INTERVENTION N/A 05/01/2024   Procedure: CORONARY STENT INTERVENTION;  Surgeon: Ladona Heinz, MD;  Location: [facility] INVASIVE CV LAB;  Service: Cardiovascular;  Laterality: N/A;   ELECTROPHYSIOLOGIC STUDY N/A 02/19/2016   Procedure: SVT Ablation;  Surgeon: Danelle LELON Birmingham, MD;  Location: [facility] INVASIVE CV LAB;  Service:  Cardiovascular;  Laterality: N/A;   ESOPHAGOGASTRODUODENOSCOPY N/A 06/25/2024   Procedure: EGD (ESOPHAGOGASTRODUODENOSCOPY);  Surgeon: Albertus Heinz HERO, MD;  Location: [facility] ENDOSCOPY;  Service: Gastroenterology;  Laterality: N/A;   HAND SURGERY     LEFT HEART CATH AND CORONARY ANGIOGRAPHY N/A 05/01/2024   Procedure: LEFT HEART CATH AND CORONARY ANGIOGRAPHY;  Surgeon: Ladona Heinz, MD;  Location: [facility] INVASIVE CV LAB;  Service: Cardiovascular;  Laterality: N/A;   septal reconstruction  2017   TOTAL KNEE ARTHROPLASTY Left 03/27/2024   Procedure: ARTHROPLASTY, KNEE, TOTAL, LEFT;  Surgeon: Addie Cordella Hamilton, MD;  Location: [facility] OR;  Service: Orthopedics;  Laterality: Left;   TURBINATE REDUCTION Bilateral 07/2016   WISDOM TOOTH EXTRACTION      Social History: Social History[1] Allergies[2]  Family History  Problem Relation Age of Onset   Stroke Mother    Heart disease Mother    High Cholesterol Mother    Sleep apnea Father    Hypertension Father    Food Allergy  Son    Colon cancer Neg Hx    Stomach cancer Neg Hx    Allergic rhinitis Neg Hx    Angioedema Neg Hx    Asthma Neg Hx    Eczema Neg Hx    Immunodeficiency Neg Hx    Urticaria Neg Hx      Prior to Admission medications  Medication Sig Start Date End Date Taking? Authorizing Provider  albuterol  (PROAIR  HFA) 108 (90 Base) MCG/ACT inhaler Inhale 1-2 puffs into the lungs every 6 (six) hours as needed for wheezing or shortness of breath. 10/11/23  Yes Kassie Acquanetta Bradley, MD  azelastine  (ASTELIN ) 0.1 % nasal spray Place 2 sprays into both nostrils 2 (two) times daily as needed for allergies. 07/18/24  Yes Tobie Arleta SQUIBB, MD  budesonide -glycopyrrolate -formoterol  (BREZTRI  AEROSPHERE) 160-9-4.8 MCG/ACT AERO inhaler Inhale 2 puffs into the lungs in the morning and at bedtime. 05/10/24  Yes Kassie Acquanetta Bradley, MD  cetirizine  (ZYRTEC  ALLERGY ) 10 MG tablet Take 1 tablet (10 mg total) by mouth daily as needed for allergies. 07/18/24  Yes Jermario Kalmar, Arleta SQUIBB, MD   cholecalciferol  (VITAMIN D3) 25 MCG (1000 UNIT) tablet Take 3,000-4,000 Units by mouth daily.   Yes [provider]  clopidogrel  (PLAVIX ) 75 MG tablet Take 1 tablet (75 mg total) by mouth daily. 08/15/24  Yes Ladona Heinz, MD  docusate sodium  (COLACE) 100 MG capsule Take 1 capsule (100 mg total) by mouth 2 (two) times daily. 05/14/24  Yes Magnant, Charles L, PA-C  ezetimibe  (ZETIA ) 10 MG tablet TAKE 1 TABLET(10 MG) BY MOUTH DAILY 08/13/24  Yes Saguier, [place], PA-C  fenofibrate  (TRICOR ) 145 MG tablet Take 1 tablet (145 mg total) by mouth daily. 06/27/24  Yes Saguier, [place], PA-C  gabapentin  (NEURONTIN ) 100 MG capsule Take 1 capsule (100 mg total) by mouth 2 (two) times daily. 07/17/24  Yes Addie Cordella Hamilton, MD  losartan  (COZAAR ) 25 MG tablet Take 1 tablet (25 mg total) by mouth daily. 09/14/23  Yes Saguier, [place], PA-C  Magnesium  250 MG TABS Take 250 mg by mouth once a week.   Yes [provider]  metFORMIN  (GLUCOPHAGE ) 500 MG tablet Take 1 tablet (500 mg total) by mouth 2 (two) times daily with a meal. Patient taking differently: Take 500 mg by mouth daily. 09/14/23  Yes Saguier, [place], PA-C  metoprolol  succinate (TOPROL -XL) 50 MG 24 hr tablet Take 1 tablet (50 mg total) by mouth at bedtime. Take with or immediately following a meal. 08/14/24 11/12/24 Yes Ladona Heinz, MD  pantoprazole  (PROTONIX ) 40 MG tablet Take 1 tablet (40 mg total) by mouth daily. 10/21/23  Yes Saguier, [place], PA-C  polyethylene glycol (MIRALAX / GLYCOLAX) 17 g packet Take 17 g by mouth daily as needed for mild constipation.   Yes [provider]  rivaroxaban  (XARELTO ) 20 MG TABS tablet Take 1 tablet (20 mg total) by mouth daily with supper. 08/14/24  Yes Ladona Heinz, MD  rosuvastatin  (CRESTOR ) 20 MG tablet Take 1 tablet (20 mg total) by mouth daily. 07/25/24 07/25/25 Yes Ladona Heinz, MD  sildenafil  (VIAGRA ) 100 MG tablet Take 1 tablet (100 mg total) by mouth daily as needed for erectile dysfunction.  11/21/23  Yes Waddell Danelle ORN, MD  tirzepatide  (ZEPBOUND ) 10 MG/0.5ML Pen Inject 10 mg into the skin once a week. 08/01/24  Yes Francyne Romano, MD  Vitamin D , Ergocalciferol , (DRISDOL ) 1.25 MG (50000 UNIT) CAPS capsule Take 1 capsule (50,000 Units total) by mouth every 7 (seven) days. 08/07/24  Yes SaguierD[place], PA-C    Physical Exam: Vitals:   08/22/24 1415 08/22/24 1430 08/22/24 1600 08/22/24 1950  BP: 107/74 117/83 122/78 116/78  Pulse: 68 66 75 72  Resp: 20 (!) 23 18 18   Temp:    98 F (36.7 C)  TempSrc:   Oral   SpO2: 100% 97% 97% 94%  Weight:   99.3 kg   Height:   5' 5 (1.651 m)    Constitutional: Resting in bed, NAD, calm, comfortable Eyes: PERRL, EOMI, lids and conjunctivae normal ENMT: Mucous membranes are moist. Posterior pharynx clear of any exudate or lesions.Normal dentition.  Neck: normal, supple, no masses. Respiratory: clear to auscultation bilaterally, no wheezing, no crackles. Normal respiratory effort. No accessory muscle use.  Cardiovascular: Regular rate and rhythm, no murmurs / rubs / gallops. No extremity edema. 2+ pedal pulses. Abdomen: no tenderness, no masses palpated. Musculoskeletal: no clubbing / cyanosis. No joint deformity upper and lower extremities. Good ROM, no contractures. Normal muscle tone.  Skin: no rashes, lesions, ulcers. No induration Neurologic: Sensation intact. Strength 5/5 in all 4.  Psychiatric: Normal judgment and insight. Alert and oriented x 3. Normal mood.   EKG: Personally reviewed. Sinus rhythm, rate 89, no acute ischemic changes.  Assessment/Plan Principal Problem:   Transient neurologic deficit Active Problems:   Pure hypercholesterolemia   Paroxysmal atrial fibrillation (HCC)   Hypertension   OSA on CPAP   CAD (coronary artery disease)   Richard Walker is a 68 y.o. male with medical history significant for CAD s/p DES to LAD (05/01/2024), PAF on Xarelto , SVT s/p ablation, HTN, HLD, GERD, prediabetes, chronic  bronchitis, OSA on CPAP who is admitted for evaluation of transient left facial droop.   Assessment and Plan: Transient left facial droop: Patient presenting evening of 12/30 with left facial droop which resolved after several hours.  He had some associated numbness to his left lower face as well.  CT head was negative.  CTAopen head/neck negative for LVO or hemodynamically significant stenosis.  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 - Keep on telemetry - Continue Xarelto  and Plavix  - Per discussion with neurology, TTE not necessary since patient has been on Xarelto  - PT/OT/SLP eval - Holding home antihypertensives tonight and plan to resume tomorrow - Hemoglobin A1c 5.4%, LDL pending. - Continue rosuvastatin , if LDL >70 consider change to atorvastatin; continue Zetia   Paroxysmal atrial fibrillation: He is in sinus rhythm with controlled rate.  Continue Xarelto  and resume Toprol -XL tomorrow.  CAD s/p DES to LAD 05/01/2024: Stable, denies chest pain.  Continue Xarelto , Plavix , rosuvastatin , Zetia .  Hypertension: Continue losartan  and Toprol -XL.  Hyperlipidemia: Continue rosuvastatin  and Zetia .  Follow-up  Chronic bronchitis: Continue Breztri  and albuterol  as needed.  GERD: Continue oral Protonix .  OSA: Continue CPAP nightly.   DVT prophylaxis:  rivaroxaban  (XARELTO ) tablet 20 mg   Code Status: Full code, confirmed with patient on admission Family Communication: 3 sons at bedside  Disposition Plan: From home and likely discharge to home pending clinical progress Consults called: Neurology Severity of Illness: The appropriate patient status for this patient is OBSERVATION. Observation status is judged to be reasonable and necessary in order to provide the required intensity of service to ensure the patient's safety. The patient's presenting symptoms, physical exam findings, and initial radiographic and  laboratory data in the context of their medical condition is felt to place them at decreased risk for further clinical deterioration. Furthermore, it is anticipated that the patient will be medically stable for discharge from the hospital within 2 midnights of admission.   Jorie Blanch MD [facility]  If 7PM-7AM, please contact night-coverage [email or web address]  08/22/2024, 8:12 PM      [1]  Social History Tobacco Use   Smoking status: Former    Current packs/day: 0.00    Average packs/day: 1 pack/day for 10.0 years (10.0 ttl pk-yrs)    Types: Cigarettes    Start date: 44    Quit date: 28    Years since quitting: 34.0    Passive exposure: Past   Smokeless tobacco: Never   Tobacco comments:    Uses [employer] occasionally  Vaping Use   Vaping status: Never Used  Substance Use Topics   Alcohol use: No   Drug use: No  [2]  Allergies Allergen Reactions   Nitroglycerin  Other (See Comments)    Tongue swelling and difficulty swallowing. Hospitalized and underwent EGD in 06/2024   Dust Mite Extract    Statins     Muscle aches   Tree Extract Other (See Comments)    Intolerance Grass   "

## 2024-08-22 NOTE — ED Notes (Signed)
 Pt meds reconciled at this time.  RX was contacted in regards to missing meds.  Will continue to monitor.

## 2024-08-22 NOTE — ED Notes (Signed)
 Pt continues to rest comfortably in side in bed.  Pt given multiple pillows.  Pt is independent and uses call bell when needed, will continue to monitor.  VSS

## 2024-08-22 NOTE — ED Notes (Signed)
 Placed patient on 4L [place] to maintain oxygen saturation above 94%. Patient tolerating well.

## 2024-08-22 NOTE — ED Notes (Signed)
 Pt requested to come off the monitor for a few moments so he can pray.

## 2024-08-22 NOTE — Hospital Course (Signed)
 Richard Walker is a 68 y.o. male with medical history significant for CAD s/p DES to LAD (05/01/2024), PAF on Xarelto , SVT s/p ablation, HTN, HLD, GERD, prediabetes, chronic bronchitis, OSA on CPAP who is admitted for evaluation of transient left facial droop.

## 2024-08-22 NOTE — ED Notes (Addendum)
 ED TO INPATIENT HANDOFF REPORT  ED Nurse Name and Phone #: Zayon Trulson, MSN, RN, PCCN  S Name/Age/Gender Richard Walker 68 y.o. male Room/Bed: MH09/MH09  Code Status   Code Status: Prior  Home/SNF/Other Home Patient oriented to: self, place, time, and situation Is this baseline? Yes   Triage Complete: Triage complete  Chief Complaint Transient neurologic deficit [R29.818]  Triage Note Pt with son- son reports L facial droop, noticed 1 hour ago, last seen appx 3 hours ago, son unsure as to if droop present at that time due to pt lying down.   Pt c/o fatigue, dizziness, chest pain, head pressure,  x 1 day.   AOx4 Cbg 93 in triage.   Elink activated 2125     Allergies Allergies[1]  Level of Care/Admitting Diagnosis ED Disposition     ED Disposition  Admit   Condition  --   Comment  Hospital Area: [facility] [[id number]]  Level of Care: Telemetry [5]  Admit to tele based on following criteria: Other see comments  Comments: monitor rhythm  Interfacility transfer: Yes  May place patient in observation at [facility] if equivalent level of care is available:: No  Diagnosis: Transient neurologic deficit [351817]  Admitting Physician: CHARLTON EVALENE RAMAN [[id number]]  Attending Physician: CHARLTON EVALENE RAMAN [[id number]]          B Medical/Surgery History Past Medical History:  Diagnosis Date   Arthritis    DDD (degenerative disc disease), lumbar    L3/L4   Diverticulosis    Dysrhythmia    SVT s/p ablation   Dysrhythmia    PAF   GERD (gastroesophageal reflux disease)    Hyperlipidemia    Low sperm motility    Myocardial infarction (HCC)    Pre-diabetes    Sleep apnea    uses cpap    SVT (supraventricular tachycardia)    Past Surgical History:  Procedure Laterality Date   AMPUTATION Left 07/15/2018   Procedure: . REPAIR OF LEFT INDEX AND LONG FINGER BY FUSION /  REPAIR OF RING FINGER;  Surgeon: Sebastian Lenis, MD;  Location:  [facility] OR;  Service: Orthopedics;  Laterality: Left;   BACK SURGERY     BLALOCK PROCEDURE     Ablation   CARDIAC CATHETERIZATION N/A 12/22/2015   Procedure: Left Heart Cath and Coronary Angiography;  Surgeon: Peter M Jordan, MD;  Location: [facility] INVASIVE CV LAB;  Service: Cardiovascular;  Laterality: N/A;   CARDIAC ELECTROPHYSIOLOGY STUDY AND ABLATION     CORONARY STENT INTERVENTION N/A 05/01/2024   Procedure: CORONARY STENT INTERVENTION;  Surgeon: Ladona Heinz, MD;  Location: [facility] INVASIVE CV LAB;  Service: Cardiovascular;  Laterality: N/A;   ELECTROPHYSIOLOGIC STUDY N/A 02/19/2016   Procedure: SVT Ablation;  Surgeon: Danelle LELON Birmingham, MD;  Location: [facility] INVASIVE CV LAB;  Service: Cardiovascular;  Laterality: N/A;   ESOPHAGOGASTRODUODENOSCOPY N/A 06/25/2024   Procedure: EGD (ESOPHAGOGASTRODUODENOSCOPY);  Surgeon: Albertus Heinz HERO, MD;  Location: [facility] ENDOSCOPY;  Service: Gastroenterology;  Laterality: N/A;   HAND SURGERY     LEFT HEART CATH AND CORONARY ANGIOGRAPHY N/A 05/01/2024   Procedure: LEFT HEART CATH AND CORONARY ANGIOGRAPHY;  Surgeon: Ladona Heinz, MD;  Location: [facility] INVASIVE CV LAB;  Service: Cardiovascular;  Laterality: N/A;   septal reconstruction  2017   TOTAL KNEE ARTHROPLASTY Left 03/27/2024   Procedure: ARTHROPLASTY, KNEE, TOTAL, LEFT;  Surgeon: Addie Cordella [facility], MD;  Location: [facility] OR;  Service: Orthopedics;  Laterality: Left;   TURBINATE REDUCTION Bilateral 07/2016  WISDOM TOOTH EXTRACTION       A IV Location/Drains/Wounds Patient Lines/Drains/Airways Status     Active Line/Drains/Airways     Name Placement date Placement time Site Days   Peripheral IV 08/21/24 Right Antecubital 08/21/24  2300  Antecubital  1            Intake/Output Last 24 hours  Intake/Output Summary (Last 24 hours) at 08/22/2024 1524 Last data filed at 08/22/2024 9388 Gross per 24 hour  Intake --  Output 800 ml  Net -800 ml    Labs/Imaging Results for orders placed or performed during the hospital  encounter of 08/21/24 (from the past 48 hours)  CBG monitoring, ED     Status: None   Collection Time: 08/21/24  9:25 PM  Result Value Ref Range   Glucose-Capillary 93 70 - 99 mg/dL    Comment: Glucose reference range applies only to samples taken after fasting for at least 8 hours.  Protime-INR     Status: Abnormal   Collection Time: 08/21/24  9:32 PM  Result Value Ref Range   Prothrombin Time 20.8 (H) 11.4 - 15.2 seconds   INR 1.7 (H) 0.8 - 1.2    Comment: (NOTE) INR goal varies based on device and disease states. Performed at [facility], [address] [place], [place] [address]   APTT     Status: Abnormal   Collection Time: 08/21/24  9:32 PM  Result Value Ref Range   aPTT 42 (H) 24 - 36 seconds    Comment:        IF BASELINE aPTT IS ELEVATED, SUGGEST PATIENT RISK ASSESSMENT BE USED TO DETERMINE APPROPRIATE ANTICOAGULANT THERAPY. Performed at [facility], [address] [place], [place] [address]   CBC     Status: None   Collection Time: 08/21/24  9:32 PM  Result Value Ref Range   WBC 8.4 4.0 - 10.5 K/uL   RBC 5.51 4.22 - 5.81 MIL/uL   Hemoglobin 15.1 13.0 - 17.0 g/dL   HCT 54.6 60.9 - 47.9 %   MCV 82.2 80.0 - 100.0 fL   MCH 27.4 26.0 - 34.0 pg   MCHC 33.3 30.0 - 36.0 g/dL   RDW 85.6 88.4 - 84.4 %   Platelets 243 150 - 400 K/uL   nRBC 0.0 0.0 - 0.2 %    Comment: Performed at [facility], [address] [place], [place] [address]  Differential     Status: None   Collection Time: 08/21/24  9:32 PM  Result Value Ref Range   Neutrophils Relative % 69 %   Neutro Abs 5.7 1.7 - 7.7 K/uL   Lymphocytes Relative 21 %   Lymphs Abs 1.8 0.7 - 4.0 K/uL   Monocytes Relative 9 %   Monocytes Absolute 0.8 0.1 - 1.0 K/uL   Eosinophils Relative 1 %   Eosinophils Absolute 0.1 0.0 - 0.5 K/uL   Basophils Relative 0 %   Basophils Absolute 0.0 0.0 - 0.1 K/uL   Immature Granulocytes 0 %   Abs Immature Granulocytes 0.03 0.00 - 0.07 K/uL     Comment: Performed at [facility], [address] [place], [place] [address]  Comprehensive metabolic panel     Status: Abnormal   Collection Time: 08/21/24  9:32 PM  Result Value Ref Range   Sodium 138 135 - 145 mmol/L   Potassium 3.9 3.5 - 5.1 mmol/L   Chloride 103 98 - 111  mmol/L   CO2 23 22 - 32 mmol/L   Glucose, Bld 86 70 - 99 mg/dL    Comment: Glucose reference range applies only to samples taken after fasting for at least 8 hours.   BUN 24 (H) 8 - 23 mg/dL   Creatinine, Ser 8.77 0.61 - 1.24 mg/dL   Calcium  10.2 8.9 - 10.3 mg/dL   Total Protein 7.2 6.5 - 8.1 g/dL   Albumin 4.5 3.5 - 5.0 g/dL   AST 19 15 - 41 U/L   ALT 11 0 - 44 U/L   Alkaline Phosphatase 71 38 - 126 U/L   Total Bilirubin 0.5 0.0 - 1.2 mg/dL   GFR, Estimated >39 >39 mL/min    Comment: (NOTE) Calculated using the CKD-EPI Creatinine Equation (2021)    Anion gap 12 5 - 15    Comment: Performed at [facility], [address] [place], [place] [address]  Troponin T, High Sensitivity     Status: None   Collection Time: 08/21/24 10:43 PM  Result Value Ref Range   Troponin T High Sensitivity <15 0 - 19 ng/L    Comment: (NOTE) Biotin concentrations > 1000 ng/mL falsely decrease TnT results.  Serial cardiac troponin measurements are suggested.  Refer to the Links section for chest pain algorithms and additional  guidance. Performed at [facility], [address] [place], [place] [address]   Resp panel by RT-PCR (RSV, Flu A&B, Covid) Anterior Nasal Swab     Status: None   Collection Time: 08/21/24 10:52 PM   Specimen: Anterior Nasal Swab  Result Value Ref Range   SARS Coronavirus 2 by RT PCR NEGATIVE NEGATIVE    Comment: (NOTE) SARS-CoV-2 target nucleic acids are NOT DETECTED.  The SARS-CoV-2 RNA is generally detectable in upper respiratory specimens during the acute phase of infection. The lowest concentration of SARS-CoV-2 viral copies this assay can detect is 138  copies/mL. A negative result does not preclude SARS-Cov-2 infection and should not be used as the sole basis for treatment or other patient management decisions. A negative result may occur with  improper specimen collection/handling, submission of specimen other than nasopharyngeal swab, presence of viral mutation(s) within the areas targeted by this assay, and inadequate number of viral copies(<138 copies/mL). A negative result must be combined with clinical observations, patient history, and epidemiological information. The expected result is Negative.  Fact Sheet for Patients:  bloggercourse.com  Fact Sheet for Healthcare Providers:  seriousbroker.it  This test is no t yet approved or cleared by the [place]  FDA and  has been authorized for detection and/or diagnosis of SARS-CoV-2 by FDA under an Emergency Use Authorization (EUA). This EUA will remain  in effect (meaning this test can be used) for the duration of the COVID-19 declaration under Section 564(b)(1) of the Act, 21 U.S.C.section 360bbb-3(b)(1), unless the authorization is terminated  or revoked sooner.       Influenza A by PCR NEGATIVE NEGATIVE   Influenza B by PCR NEGATIVE NEGATIVE    Comment: (NOTE) The Xpert Xpress SARS-CoV-2/FLU/RSV plus assay is intended as an aid in the diagnosis of influenza from Nasopharyngeal swab specimens and should not be used as a sole basis for treatment. Nasal washings and aspirates are unacceptable for Xpert Xpress SARS-CoV-2/FLU/RSV testing.  Fact Sheet for Patients: bloggercourse.com  Fact Sheet for Healthcare Providers: seriousbroker.it  This test is not yet approved or cleared by the [place]  FDA and has been authorized for detection and/or  diagnosis of SARS-CoV-2 by FDA under an Emergency Use Authorization (EUA). This EUA will remain in effect (meaning this test  can be used) for the duration of the COVID-19 declaration under Section 564(b)(1) of the Act, 21 U.S.C. section 360bbb-3(b)(1), unless the authorization is terminated or revoked.     Resp Syncytial Virus by PCR NEGATIVE NEGATIVE    Comment: (NOTE) Fact Sheet for Patients: bloggercourse.com  Fact Sheet for Healthcare Providers: seriousbroker.it  This test is not yet approved or cleared by the [place]  FDA and has been authorized for detection and/or diagnosis of SARS-CoV-2 by FDA under an Emergency Use Authorization (EUA). This EUA will remain in effect (meaning this test can be used) for the duration of the COVID-19 declaration under Section 564(b)(1) of the Act, 21 U.S.C. section 360bbb-3(b)(1), unless the authorization is terminated or revoked.  Performed at [facility], [address] [place], [place] [address]   Rapid urine drug screen (hospital performed)     Status: None   Collection Time: 08/22/24  1:16 AM  Result Value Ref Range   Opiates NEGATIVE NEGATIVE   Cocaine NEGATIVE NEGATIVE   Benzodiazepines NEGATIVE NEGATIVE   Amphetamines NEGATIVE NEGATIVE   Tetrahydrocannabinol NEGATIVE NEGATIVE   Barbiturates NEGATIVE NEGATIVE   Methadone Scn, Ur NEGATIVE NEGATIVE   Fentanyl  NEGATIVE NEGATIVE    Comment: (NOTE) Drug screen is for Medical Purposes only. Positive results are preliminary only. If confirmation is needed, notify lab within 5 days.  Drug Class                 Cutoff (ng/mL) Amphetamine and metabolites 1000 Barbiturate and metabolites 200 Benzodiazepine              200 Opiates and metabolites     300 Cocaine and metabolites     300 THC                         50 Fentanyl                     5 Methadone                   300  Trazodone is metabolized in vivo to several metabolites,  including pharmacologically active m-CPP, which is excreted in the  urine.  Immunoassay screens for  amphetamines and MDMA have potential  cross-reactivity with these compounds and may provide false positive  result.  Performed at [facility], [address] [place], [place] [address]   Hemoglobin A1c     Status: None   Collection Time: 08/22/24  8:12 AM  Result Value Ref Range   Hgb A1c MFr Bld 5.4 4.8 - 5.6 %    Comment: (NOTE) Diagnosis of Diabetes The following HbA1c ranges recommended by the American Diabetes Association (ADA) may be used as an aid in the diagnosis of diabetes mellitus.  Hemoglobin             Suggested A1C NGSP%              Diagnosis  <5.7                   Non Diabetic  5.7-6.4                Pre-Diabetic  >6.4                   Diabetic  <7.0  Glycemic control for                       adults with diabetes.     Mean Plasma Glucose 108.28 mg/dL    Comment: Performed at [facility], [address], [place], [place] [address]   CT ANGIO HEAD NECK W WO CM Result Date: 08/21/2024 EXAM: CTA HEAD AND NECK WITH AND WITHOUT 08/21/2024 09:57:06 PM TECHNIQUE: CTA of the head and neck was performed with and without the administration of intravenous contrast. Multiplanar 2D and/or 3D reformatted images are provided for review. Automated exposure control, iterative reconstruction, and/or weight based adjustment of the mA/kV was utilized to reduce the radiation dose to as low as reasonably achievable. Stenosis of the internal carotid arteries measured using NASCET criteria. COMPARISON: None available CLINICAL HISTORY: Neuro deficit, acute, stroke suspected. FINDINGS: CTA NECK: AORTIC ARCH AND ARCH VESSELS: Aortic atherosclerosis. No dissection or arterial injury. No significant stenosis of the brachiocephalic or subclavian arteries. CERVICAL CAROTID ARTERIES: Atherosclerotic calcification of both carotid arteries bifurcations without hemodynamically significant stenosis of the internal carotid arteries by NASCET criteria. No  dissection or arterial injury. CERVICAL VERTEBRAL ARTERIES: No dissection, arterial injury, or significant stenosis. LUNGS AND MEDIASTINUM: Unremarkable. SOFT TISSUES: No acute abnormality. BONES: No acute abnormality. CTA HEAD: ANTERIOR CIRCULATION: No significant stenosis of the internal carotid arteries. No significant stenosis of the anterior cerebral arteries. No significant stenosis of the middle cerebral arteries. No aneurysm. POSTERIOR CIRCULATION: No significant stenosis of the posterior cerebral arteries. No significant stenosis of the basilar artery. No significant stenosis of the vertebral arteries. No aneurysm. OTHER: No dural venous sinus thrombosis on this non-dedicated study. IMPRESSION: 1. No large vessel occlusion, hemodynamically significant stenosis, or aneurysm in the head or neck. 2. Atherosclerotic calcification of both carotid artery bifurcations without hemodynamically significant stenosis of the internal carotid arteries. Electronically signed by: Franky Stanford MD 08/21/2024 10:14 PM EST RP Workstation: [id number]   CT HEAD CODE STROKE WO CONTRAST (LKW 0-4.5h, LVO 0-24h) Result Date: 08/21/2024 EXAM: CT HEAD WITHOUT CONTRAST 08/21/2024 09:44:38 PM TECHNIQUE: CT of the head was performed without the administration of intravenous contrast. Automated exposure control, iterative reconstruction, and/or weight based adjustment of the mA/kV was utilized to reduce the radiation dose to as low as reasonably achievable. COMPARISON: 06/24/2024, 08/16/2024. CLINICAL HISTORY: Neuro deficit, acute, stroke suspected. FINDINGS: BRAIN AND VENTRICLES: No acute hemorrhage. No evidence of acute infarct. No hydrocephalus. No extra-axial collection. No mass effect or midline shift. The [place] Stroke Program Early CT Score (ASPECTS) is 10, with a ganglionic score of 7 (caudate, internal capsule, lentiform nucleus, insula, M1-M3) and a supraganglionic score of 3 (M4-M6). A score of 10 indicates no early  ischemic changes. ORBITS: No acute abnormality. SINUSES: No acute abnormality. SOFT TISSUES AND SKULL: No acute soft tissue abnormality. No skull fracture. IMPRESSION: 1. No acute intracranial abnormality. 2. ASPECTS 10. Findings reported to Dr. Dreama at 9:49 PM on 08/21/2024. Electronically signed by: Franky Stanford MD 08/21/2024 09:49 PM EST RP Workstation: [id number]    Pending Labs Unresulted Labs (From admission, onward)     Start     Ordered   08/22/24 0012  LDL cholesterol, direct  Once,   URGENT        08/22/24 0013            Vitals/Pain Today's Vitals   08/22/24 0733 08/22/24 0817 08/22/24 0945 08/22/24 1320  BP:   108/81 103/72  Pulse:   68 69  Resp:   (!) 22 18  Temp:    (!) 97.1 F (36.2 C)  TempSrc:    Oral  SpO2:   100% 99%  Weight:      PainSc: Asleep 4   0-No pain    Isolation Precautions No active isolations  Medications Medications  budesonide -glycopyrrolate -formoterol  (BREZTRI ) 160-9-4.8 MCG/ACT inhaler 2 puff (2 puffs Inhalation Not Given 08/22/24 1052)  clopidogrel  (PLAVIX ) tablet 75 mg (75 mg Oral Given by Other 08/22/24 0110)  gabapentin  (NEURONTIN ) capsule 100 mg (100 mg Oral Given 08/22/24 1016)  rivaroxaban  (XARELTO ) tablet 20 mg (has no administration in time range)  albuterol  (VENTOLIN  HFA) 108 (90 Base) MCG/ACT inhaler 1-2 puff (has no administration in time range)  azelastine  (ASTELIN ) 0.1 % nasal spray 2 spray (has no administration in time range)  loratadine  (CLARITIN ) tablet 10 mg (10 mg Oral Given 08/22/24 1016)  docusate sodium  (COLACE) capsule 100 mg (100 mg Oral Given 08/22/24 1015)  ezetimibe  (ZETIA ) tablet 10 mg (10 mg Oral Not Given 08/22/24 1051)  fenofibrate  tablet 160 mg (160 mg Oral Not Given 08/22/24 1051)  losartan  (COZAAR ) tablet 25 mg (25 mg Oral Given 08/22/24 1016)  metFORMIN  (GLUCOPHAGE ) tablet 500 mg (500 mg Oral Given 08/22/24 1441)  metoprolol  succinate (TOPROL -XL) 24 hr tablet 50 mg (has no administration in  time range)  pantoprazole  (PROTONIX ) EC tablet 40 mg (40 mg Oral Given 08/22/24 1015)  polyethylene glycol (MIRALAX / GLYCOLAX) packet 17 g (0 g Oral Hold 08/22/24 0508)  rosuvastatin  (CRESTOR ) tablet 20 mg (20 mg Oral Not Given 08/22/24 1052)  iohexol  (OMNIPAQUE ) 350 MG/ML injection 75 mL (75 mLs Intravenous Contrast Given 08/21/24 2159)    Mobility walks     Focused Assessments Neuro Assessment Handoff:  Swallow screen pass? Yes  Cardiac Rhythm: Normal sinus rhythm NIH Stroke Scale  Dizziness Present: No Headache Present: No Interval: Other (Comment) Level of Consciousness (1a.)   : Alert, keenly responsive LOC Questions (1b. )   : Answers both questions correctly LOC Commands (1c. )   : Performs both tasks correctly Best Gaze (2. )  : Normal Visual (3. )  : No visual loss Facial Palsy (4. )    : Minor paralysis Motor Arm, Left (5a. )   : No drift Motor Arm, Right (5b. ) : No drift Motor Leg, Left (6a. )  : No drift Motor Leg, Right (6b. ) : No drift Limb Ataxia (7. ): Absent Sensory (8. )  : Normal, no sensory loss Best Language (9. )  : No aphasia Dysarthria (10. ): Normal Extinction/Inattention (11.)   : No Abnormality Complete NIHSS TOTAL: 1 Last date known well: 08/21/24   Neuro Assessment: Within Defined Limits Neuro Checks:   Initial (08/21/24 2133)  Has TPA been given? No If patient is a Neuro Trauma and patient is going to OR before floor call report to 4N Charge nurse: [phone] or [phone]   R Recommendations: See Admitting Provider Note  Report given to: Josette, RN  Additional Notes:      [1]  Allergies Allergen Reactions   Nitroglycerin  Other (See Comments)    Tongue swelling and difficulty swallowing. Hospitalized and underwent EGD in 06/2024   Dust Mite Extract    Statins     Muscle aches   Tree Extract Other (See Comments)    Intolerance Grass

## 2024-08-22 NOTE — Progress Notes (Signed)
 Plan of Care Note for accepted transfer   Patient: Richard Walker MRN: [id number]   DOA: 08/21/2024  Facility requesting transfer: [facility]   Requesting Provider: Dr. Dreama   Reason for transfer: Facial droop   Facility course: 68 yr old man with T2DM, CAD, OSA, SVT, and a fib on Xarelto  who presents with left facial droop.   There are no acute findings on head CT or CTA head & neck.   Teleneurology evaluated the patient and recommended CVA/TIA workup.   Plan of care: The patient is accepted for admission to Telemetry unit, at [facility].   Author: Evalene GORMAN Sprinkles, MD 08/22/2024  Check [email or web address] for on-call coverage.  Nursing staff, Please call [facility] Admits & Consults System-Wide number on Amion as soon as patient's arrival, so appropriate admitting provider can evaluate the pt.

## 2024-08-22 NOTE — ED Notes (Signed)
 Pt urinal emptied at this time from over night use.  Same charted.  Pt eager for update, he was provided with as much information as available and made aware of ordered MR of brain.

## 2024-08-23 ENCOUNTER — Observation Stay (HOSPITAL_COMMUNITY): Payer: Medicare (Managed Care)

## 2024-08-23 DIAGNOSIS — R29818 Other symptoms and signs involving the nervous system: Secondary | ICD-10-CM | POA: Diagnosis not present

## 2024-08-23 LAB — CBC
HCT: 40.9 % (ref 39.0–52.0)
Hemoglobin: 13.3 g/dL (ref 13.0–17.0)
MCH: 27.4 pg (ref 26.0–34.0)
MCHC: 32.5 g/dL (ref 30.0–36.0)
MCV: 84.3 fL (ref 80.0–100.0)
Platelets: 201 K/uL (ref 150–400)
RBC: 4.85 MIL/uL (ref 4.22–5.81)
RDW: 14.3 % (ref 11.5–15.5)
WBC: 5.6 K/uL (ref 4.0–10.5)
nRBC: 0 % (ref 0.0–0.2)

## 2024-08-23 LAB — BASIC METABOLIC PANEL WITH GFR
Anion gap: 10 (ref 5–15)
BUN: 14 mg/dL (ref 8–23)
CO2: 24 mmol/L (ref 22–32)
Calcium: 10.2 mg/dL (ref 8.9–10.3)
Chloride: 103 mmol/L (ref 98–111)
Creatinine, Ser: 1.13 mg/dL (ref 0.61–1.24)
GFR, Estimated: >60 mL/min
Glucose, Bld: 115 mg/dL — ABNORMAL HIGH (ref 70–99)
Potassium: 3.8 mmol/L (ref 3.5–5.1)
Sodium: 138 mmol/L (ref 135–145)

## 2024-08-23 LAB — LDL CHOLESTEROL, DIRECT: Direct LDL: 40 mg/dL (ref 0–99)

## 2024-08-23 MED ORDER — FENOFIBRATE 160 MG PO TABS
160.0000 mg | ORAL_TABLET | Freq: Every day | ORAL | 2 refills | Status: AC
  Filled 2024-08-23: qty 90, 90d supply, fill #0

## 2024-08-23 MED ORDER — ROSUVASTATIN CALCIUM 20 MG PO TABS
20.0000 mg | ORAL_TABLET | Freq: Every day | ORAL | 3 refills | Status: AC
  Filled 2024-08-23: qty 90, 90d supply, fill #0

## 2024-08-23 MED ORDER — ACETAMINOPHEN 325 MG PO TABS: 650.0000 mg | ORAL_TABLET | ORAL | Status: AC | PRN

## 2024-08-23 NOTE — Plan of Care (Signed)

## 2024-08-23 NOTE — Discharge Summary (Signed)
 Physician Discharge Summary  Richard Walker FMW:[id number] DOB: 31-May-1956 DOA: 08/21/2024  PCP: Dorina Loving, PA-C  Admit date: 08/21/2024 Discharge date: 08/23/2024  Admitted From: home Disposition: home Recommendations for Outpatient Follow-up:  Follow up with PCP in 1-2 weeks Please obtain BMP/CBC in one week  Home Health:none Equipment/Devices:none Discharge Condition:stable CODE STATUS:full Diet recommendation: Heart Healthy / Carb Modified / Regular / Dysphagia   Brief/Interim Summary:68 y.o. male with medical history significant for CAD s/p DES to LAD (05/01/2024), PAF on Xarelto , SVT s/p ablation, HTN, HLD, GERD, prediabetes, chronic bronchitis, OSA on CPAP who presented to the ED evening of 08/21/2024 for evaluation of left facial droop.   Patient reports that he has been on Zepbound  prescribed by his primary care physician.  He says the dose was increased from 7.5 mg to 10 mg this past Monday (08/20/2024).  He was advised on potential adverse effects.  Patient states that after increasing the dosing he had been feeling generally unwell and fatigued.  Yesterday night around 8 PM his son noticed that he had drooping of his left lower face.  He did not have any difficulty with speech or slurring his words.  Patient notes that he had associated headache and blurry vision.  He also recalls having some numbness to his left lower face when compared to the right.   His son took him immediately to the emergency department.  Patient states that his facial drooping was still present on arrival but symptoms resolved after a few hours.  He does note some continued mild blurring of his vision.  He has not had any weakness in his extremities.   He reports adherence to his Xarelto  and Plavix .   [facility] ED Course  Labs/Imaging on admission: I have personally reviewed following labs and imaging studies.   Initial vitals showed BP 106/75, pulse 92, RR 18, temp 98.0 F, SpO2 99% on  room air.   Labs showed WBC 8.4, hemoglobin 15.1, platelets 243, sodium 138, potassium 3.9, bicarb 23, BUN 24, creatinine 1.22, serum glucose 86, LFTs within normal limits, troponin T <15.   SARS-CoV-2, influenza, RSV PCR negative.  UDS negative.  Hemoglobin A1c 5.4%.   CT head without contrast negative for acute intracranial abnormality.   CTA head/neck negative for LVO, hemodynamically significant stenosis, or aneurysm of the head or neck.   Teleneurology (Dr. Ana Cowing) were consulted and recommended admission for CVA workup.  Patient was restarted on home medications including Xarelto , Plavix , losartan , Toprol -XL.  The hospitalist service was consulted for admission.  Patient arrived to the floor at [facility] evening of 08/22/2024.   Discharge Diagnoses:  Principal Problem:   Transient neurologic deficit Active Problems:   Pure hypercholesterolemia   Paroxysmal atrial fibrillation (HCC)   Hypertension   OSA on CPAP   CAD (coronary artery disease)    Transient left facial droop: Patient presenting evening of 12/30 with left facial droop which resolved after several hours.  He had some associated numbness to his left lower face as well.  CT head was negative.  CTAopen head/neck negative for LVO or hemodynamically significant stenosis.   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  He was seen by duly neurology Dr. Cowing recommended to continue Xarelto  and Plavix  along with the statin he was taking prior to admission.  There was a concern due to recent increased dose of tirzepatide  whether that had anything to do with his  presentation.  So at the time of discharge this medication has been on hold.  He will follow-up with his PCP and restart Zepbound  probably at a lower dose and to monitor closely. Hemoglobin A1c LDL    Paroxysmal atrial fibrillation: He is in sinus rhythm with controlled rate.  Continue Xarelto   and resumed Toprol -XL    CAD s/p DES to LAD 05/01/2024: Stable, denied chest pain.  Continue Xarelto , Plavix , rosuvastatin , Zetia .   Hypertension: Continue losartan  and Toprol -XL.   Hyperlipidemia: Continue rosuvastatin  and Zetia .    Chronic bronchitis: Continue Breztri  and albuterol  as needed.   GERD: Continue oral Protonix .   OSA: Continue CPAP    Estimated body mass index is 36.43 kg/m as calculated from the following:   Height as of this encounter: 5' 5 (1.651 m).   Weight as of this encounter: 99.3 kg.  Discharge Instructions  Discharge Instructions     Increase activity slowly   Complete by: As directed       Allergies as of 08/23/2024       Reactions   Nitroglycerin  Other (See Comments)   Tongue swelling and difficulty swallowing. Hospitalized and underwent EGD in 06/2024   Dust Mite Extract    Statins    Muscle aches   Tree Extract Other (See Comments)   Intolerance Grass        Medication List     STOP taking these medications    Zepbound  10 MG/0.5ML Pen Generic drug: tirzepatide        TAKE these medications    acetaminophen  325 MG tablet Commonly known as: TYLENOL  Take 2 tablets (650 mg total) by mouth every 4 (four) hours as needed for mild pain (pain score 1-3) or fever (or temp > 37.5 C (99.5 F)).   albuterol  108 (90 Base) MCG/ACT inhaler Commonly known as: ProAir  HFA Inhale 1-2 puffs into the lungs every 6 (six) hours as needed for wheezing or shortness of breath.   azelastine  0.1 % nasal spray Commonly known as: ASTELIN  Place 2 sprays into both nostrils 2 (two) times daily as needed for allergies.   Breztri  Aerosphere 160-9-4.8 MCG/ACT Aero inhaler Generic drug: budesonide -glycopyrrolate -formoterol  Inhale 2 puffs into the lungs in the morning and at bedtime.   cetirizine  10 MG tablet Commonly known as: ZyrTEC  Allergy  Take 1 tablet (10 mg total) by mouth daily as needed for allergies.   cholecalciferol  25 MCG (1000 UNIT)  tablet Commonly known as: VITAMIN D3 Take 3,000-4,000 Units by mouth daily.   clopidogrel  75 MG tablet Commonly known as: Plavix  Take 1 tablet (75 mg total) by mouth daily.   docusate sodium  100 MG capsule Commonly known as: Colace Take 1 capsule (100 mg total) by mouth 2 (two) times daily.   ezetimibe  10 MG tablet Commonly known as: ZETIA  TAKE 1 TABLET(10 MG) BY MOUTH DAILY   fenofibrate  160 MG tablet Take 1 tablet (160 mg total) by mouth daily. Start taking on: August 24, 2024 What changed:  medication strength how much to take   gabapentin  100 MG capsule Commonly known as: NEURONTIN  Take 1 capsule (100 mg total) by mouth 2 (two) times daily.   losartan  25 MG tablet Commonly known as: COZAAR  Take 1 tablet (25 mg total) by mouth daily.   Magnesium  250 MG Tabs Take 250 mg by mouth once a week.   metFORMIN  500 MG tablet Commonly known as: GLUCOPHAGE  Take 1 tablet (500 mg total) by mouth 2 (two) times daily with a meal. What changed: when to take  this   metoprolol  succinate 50 MG 24 hr tablet Commonly known as: TOPROL -XL Take 1 tablet (50 mg total) by mouth at bedtime. Take with or immediately following a meal.   pantoprazole  40 MG tablet Commonly known as: PROTONIX  Take 1 tablet (40 mg total) by mouth daily.   polyethylene glycol 17 g packet Commonly known as: MIRALAX / GLYCOLAX Take 17 g by mouth daily as needed for mild constipation.   rivaroxaban  20 MG Tabs tablet Commonly known as: Xarelto  Take 1 tablet (20 mg total) by mouth daily with supper.   rosuvastatin  20 MG tablet Commonly known as: Crestor  Take 1 tablet (20 mg total) by mouth daily.   sildenafil  100 MG tablet Commonly known as: VIAGRA  Take 1 tablet (100 mg total) by mouth daily as needed for erectile dysfunction.   Vitamin D  (Ergocalciferol ) 1.25 MG (50000 UNIT) Caps capsule Commonly known as: DRISDOL  Take 1 capsule (50,000 Units total) by mouth every 7 (seven) days.        Follow-up  Information     Saguier, [place], PA-C Follow up.   Specialties: Internal Medicine, Family Medicine Contact information: [address] [place] [address] [phone]                Allergies[1]  Consultations: neuro   Procedures/Studies: MR Brain W and Wo Contrast Result Date: 08/22/2024 EXAM: MRI BRAIN WITH AND WITHOUT CONTRAST 08/22/2024 05:38:50 PM TECHNIQUE: Multiplanar multisequence MRI of the head/brain was performed with and without the administration of 10 mL gadobutrol (GADAVIST) 1 MMOL/ML injection 10 mL GADOBUTROL 1 MMOL/ML IV SOLN intravenous contrast. COMPARISON: CT head and CTA head and neck 08/21/2024. CLINICAL HISTORY: Neuro deficit, acute, stroke suspected. FINDINGS: BRAIN AND VENTRICLES: No acute infarct. No acute intracranial hemorrhage. No mass effect or midline shift. No hydrocephalus. The sella is unremarkable. Normal flow voids. No mass or abnormal enhancement. Scattered T2 and FLAIR hyperintensity in the periventricular and subcortical white matter suggestive of mild chronic microvascular ischemic changes. There is mild generalized parenchymal volume loss. 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 ORBITS: No acute abnormality. SINUSES: No acute abnormality. BONES AND SOFT TISSUES: Normal bone marrow signal and enhancement. No acute soft tissue abnormality. IMPRESSION: 1. No acute intracranial abnormality. No abnormal enhancement. 2. 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 Mild chronic microvascular ischemic changes. Electronically signed by: Donnice Mania MD 08/22/2024 07:25 PM EST RP Workstation: [id number]   CT ANGIO HEAD NECK W WO CM Result Date: 08/21/2024 EXAM: CTA HEAD AND NECK WITH AND WITHOUT 08/21/2024 09:57:06 PM TECHNIQUE: CTA of the head and neck was performed with and without the administration of intravenous contrast. Multiplanar 2D and/or 3D reformatted images are provided for  review. Automated exposure control, iterative reconstruction, and/or weight based adjustment of the mA/kV was utilized to reduce the radiation dose to as low as reasonably achievable. Stenosis of the internal carotid arteries measured using NASCET criteria. COMPARISON: None available CLINICAL HISTORY: Neuro deficit, acute, stroke suspected. FINDINGS: CTA NECK: AORTIC ARCH AND ARCH VESSELS: Aortic atherosclerosis. No dissection or arterial injury. No significant stenosis of the brachiocephalic or subclavian arteries. CERVICAL CAROTID ARTERIES: Atherosclerotic calcification of both carotid arteries bifurcations without hemodynamically significant stenosis of the internal carotid arteries by NASCET criteria. No dissection or arterial injury. CERVICAL VERTEBRAL ARTERIES: No dissection, arterial injury, or significant stenosis. LUNGS AND MEDIASTINUM: Unremarkable. SOFT TISSUES: No acute abnormality. BONES: No acute abnormality. CTA HEAD: ANTERIOR CIRCULATION: No significant stenosis of the  internal carotid arteries. No significant stenosis of the anterior cerebral arteries. No significant stenosis of the middle cerebral arteries. No aneurysm. POSTERIOR CIRCULATION: No significant stenosis of the posterior cerebral arteries. No significant stenosis of the basilar artery. No significant stenosis of the vertebral arteries. No aneurysm. OTHER: No dural venous sinus thrombosis on this non-dedicated study. IMPRESSION: 1. No large vessel occlusion, hemodynamically significant stenosis, or aneurysm in the head or neck. 2. Atherosclerotic calcification of both carotid artery bifurcations without hemodynamically significant stenosis of the internal carotid arteries. Electronically signed by: Franky Stanford MD 08/21/2024 10:14 PM EST RP Workstation: [id number]   CT HEAD CODE STROKE WO CONTRAST (LKW 0-4.5h, LVO 0-24h) Result Date: 08/21/2024 EXAM: CT HEAD WITHOUT CONTRAST 08/21/2024 09:44:38 PM TECHNIQUE: CT of the head was  performed without the administration of intravenous contrast. Automated exposure control, iterative reconstruction, and/or weight based adjustment of the mA/kV was utilized to reduce the radiation dose to as low as reasonably achievable. COMPARISON: 06/24/2024, 08/16/2024. CLINICAL HISTORY: Neuro deficit, acute, stroke suspected. FINDINGS: BRAIN AND VENTRICLES: No acute hemorrhage. No evidence of acute infarct. No hydrocephalus. No extra-axial collection. No mass effect or midline shift. The [place] Stroke Program Early CT Score (ASPECTS) is 10, with a ganglionic score of 7 (caudate, internal capsule, lentiform nucleus, insula, M1-M3) and a supraganglionic score of 3 (M4-M6). A score of 10 indicates no early ischemic changes. ORBITS: No acute abnormality. SINUSES: No acute abnormality. SOFT TISSUES AND SKULL: No acute soft tissue abnormality. No skull fracture. IMPRESSION: 1. No acute intracranial abnormality. 2. ASPECTS 10. Findings reported to Dr. Dreama at 9:49 PM on 08/21/2024. Electronically signed by: Franky Stanford MD 08/21/2024 09:49 PM EST RP Workstation: [id number]   XR KNEE 3 VIEW RIGHT Result Date: 08/04/2024 AP lateral merchant radiograph right knee reviewed.  Slight varus alignment present.  No fracture or dislocation.  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  (Echo, Carotid, EGD, Colonoscopy, ERCP)    Subjective:  Awake alert denies any facial numbness weakness dysphagia dysarthria ambulated in the hallway anxious to go home Discharge Exam: Vitals:   08/23/24 0037 08/23/24 0449  BP: 113/73 125/75  Pulse: 64 63  Resp: 18 18  Temp: 97.8 F (36.6 C) 97.6 F (36.4 C)  SpO2: 95% 98%   Vitals:   08/22/24 2132 08/22/24 2246 08/23/24 0037 08/23/24 0449  BP:   113/73 125/75  Pulse:   64 63  Resp:  15 18 18   Temp:   97.8 F (36.6 C) 97.6 F (36.4 C)  TempSrc:      SpO2: 94%  95% 98%  Weight:      Height:         General: Pt is alert, awake, not in acute distress Cardiovascular: RRR, S1/S2 +, no rubs, no gallops Respiratory: CTA bilaterally, no wheezing, no rhonchi Abdominal: Soft, NT, ND, bowel sounds + Extremities: no edema, no cyanosis    The results of significant diagnostics from this hospitalization (including imaging, microbiology, ancillary and laboratory) are listed below for reference.     Microbiology: Recent Results (from the past 240 hours)  Resp panel by RT-PCR (RSV, Flu A&B, Covid) Anterior Nasal Swab     Status: None   Collection Time: 08/21/24 10:52 PM   Specimen: Anterior Nasal Swab  Result Value Ref Range Status   SARS Coronavirus 2 by RT PCR NEGATIVE NEGATIVE Final    Comment: (NOTE) SARS-CoV-2 target nucleic acids are NOT DETECTED.  The SARS-CoV-2 RNA is generally detectable in  upper respiratory specimens during the acute phase of infection. The lowest concentration of SARS-CoV-2 viral copies this assay can detect is 138 copies/mL. A negative result does not preclude SARS-Cov-2 infection and should not be used as the sole basis for treatment or other patient management decisions. A negative result may occur with  improper specimen collection/handling, submission of specimen other than nasopharyngeal swab, presence of viral mutation(s) within the areas targeted by this assay, and inadequate number of viral copies(<138 copies/mL). A negative result must be combined with clinical observations, patient history, and epidemiological information. The expected result is Negative.  Fact Sheet for Patients:  bloggercourse.com  Fact Sheet for Healthcare Providers:  seriousbroker.it  This test is no t yet approved or cleared by the [place]  FDA and  has been authorized for detection and/or diagnosis of SARS-CoV-2 by FDA under an Emergency Use Authorization (EUA). This EUA will remain  in effect (meaning this test  can be used) for the duration of the COVID-19 declaration under Section 564(b)(1) of the Act, 21 U.S.C.section 360bbb-3(b)(1), unless the authorization is terminated  or revoked sooner.       Influenza A by PCR NEGATIVE NEGATIVE Final   Influenza B by PCR NEGATIVE NEGATIVE Final    Comment: (NOTE) The Xpert Xpress SARS-CoV-2/FLU/RSV plus assay is intended as an aid in the diagnosis of influenza from Nasopharyngeal swab specimens and should not be used as a sole basis for treatment. Nasal washings and aspirates are unacceptable for Xpert Xpress SARS-CoV-2/FLU/RSV testing.  Fact Sheet for Patients: bloggercourse.com  Fact Sheet for Healthcare Providers: seriousbroker.it  This test is not yet approved or cleared by the [place]  FDA and has been authorized for detection and/or diagnosis of SARS-CoV-2 by FDA under an Emergency Use Authorization (EUA). This EUA will remain in effect (meaning this test can be used) for the duration of the COVID-19 declaration under Section 564(b)(1) of the Act, 21 U.S.C. section 360bbb-3(b)(1), unless the authorization is terminated or revoked.     Resp Syncytial Virus by PCR NEGATIVE NEGATIVE Final    Comment: (NOTE) Fact Sheet for Patients: bloggercourse.com  Fact Sheet for Healthcare Providers: seriousbroker.it  This test is not yet approved or cleared by the [place]  FDA and has been authorized for detection and/or diagnosis of SARS-CoV-2 by FDA under an Emergency Use Authorization (EUA). This EUA will remain in effect (meaning this test can be used) for the duration of the COVID-19 declaration under Section 564(b)(1) of the Act, 21 U.S.C. section 360bbb-3(b)(1), unless the authorization is terminated or revoked.  Performed at [facility], [address] [place], [place] [address]      Labs: BNP (last 3  results) No results for input(s): BNP in the last 8760 hours. Basic Metabolic Panel: Recent Labs  Lab 08/21/24 2132 08/23/24 0649  NA 138 138  K 3.9 3.8  CL 103 103  CO2 23 24  GLUCOSE 86 115*  BUN 24* 14  CREATININE 1.22 1.13  CALCIUM  10.2 10.2   Liver Function Tests: Recent Labs  Lab 08/21/24 2132  AST 19  ALT 11  ALKPHOS 71  BILITOT 0.5  PROT 7.2  ALBUMIN 4.5   No results for input(s): LIPASE, AMYLASE in the last 168 hours. No results for input(s): AMMONIA in the last 168 hours. CBC: Recent Labs  Lab 08/21/24 2132 08/23/24 0649  WBC 8.4 5.6  NEUTROABS 5.7  --   HGB 15.1 13.3  HCT 45.3 40.9  MCV 82.2 84.3  PLT  243 201   Cardiac Enzymes: No results for input(s): CKTOTAL, CKMB, CKMBINDEX, TROPONINI in the last 168 hours. BNP: Invalid input(s): POCBNP CBG: Recent Labs  Lab 08/21/24 2125  GLUCAP 93   D-Dimer No results for input(s): DDIMER in the last 72 hours. Hgb A1c Recent Labs    08/22/24 0812  HGBA1C 5.4   Lipid Profile Recent Labs    08/22/24 0812  LDLDIRECT 40   Thyroid  function studies No results for input(s): TSH, T4TOTAL, T3FREE, THYROIDAB in the last 72 hours.  Invalid input(s): FREET3 Anemia work up No results for input(s): VITAMINB12, FOLATE, FERRITIN, TIBC, IRON, RETICCTPCT in the last 72 hours. Urinalysis    Component Value Date/Time   COLORURINE YELLOW 06/07/2024 1734   APPEARANCEUR Clear 07/23/2024 0000   LABSPEC >=1.030 06/07/2024 1734   PHURINE 6.5 06/07/2024 1734   GLUCOSEU Negative 07/23/2024 0000   GLUCOSEU NEGATIVE 10/24/2017 1023   HGBUR TRACE (A) 06/07/2024 1734   BILIRUBINUR Negative 07/23/2024 0000   KETONESUR NEGATIVE 06/07/2024 1734   PROTEINUR Negative 07/23/2024 0000   PROTEINUR NEGATIVE 06/07/2024 1734   UROBILINOGEN 0.2 06/12/2024 1132   UROBILINOGEN 0.2 10/24/2017 1023   NITRITE Negative 07/23/2024 0000   NITRITE NEGATIVE 06/07/2024 1734   LEUKOCYTESUR  Negative 07/23/2024 0000   LEUKOCYTESUR NEGATIVE 06/07/2024 1734   Sepsis Labs Recent Labs  Lab 08/21/24 2132 08/23/24 0649  WBC 8.4 5.6   Microbiology Recent Results (from the past 240 hours)  Resp panel by RT-PCR (RSV, Flu A&B, Covid) Anterior Nasal Swab     Status: None   Collection Time: 08/21/24 10:52 PM   Specimen: Anterior Nasal Swab  Result Value Ref Range Status   SARS Coronavirus 2 by RT PCR NEGATIVE NEGATIVE Final    Comment: (NOTE) SARS-CoV-2 target nucleic acids are NOT DETECTED.  The SARS-CoV-2 RNA is generally detectable in upper respiratory specimens during the acute phase of infection. The lowest concentration of SARS-CoV-2 viral copies this assay can detect is 138 copies/mL. A negative result does not preclude SARS-Cov-2 infection and should not be used as the sole basis for treatment or other patient management decisions. A negative result may occur with  improper specimen collection/handling, submission of specimen other than nasopharyngeal swab, presence of viral mutation(s) within the areas targeted by this assay, and inadequate number of viral copies(<138 copies/mL). A negative result must be combined with clinical observations, patient history, and epidemiological information. The expected result is Negative.  Fact Sheet for Patients:  bloggercourse.com  Fact Sheet for Healthcare Providers:  seriousbroker.it  This test is no t yet approved or cleared by the [place]  FDA and  has been authorized for detection and/or diagnosis of SARS-CoV-2 by FDA under an Emergency Use Authorization (EUA). This EUA will remain  in effect (meaning this test can be used) for the duration of the COVID-19 declaration under Section 564(b)(1) of the Act, 21 U.S.C.section 360bbb-3(b)(1), unless the authorization is terminated  or revoked sooner.       Influenza A by PCR NEGATIVE NEGATIVE Final   Influenza B by PCR  NEGATIVE NEGATIVE Final    Comment: (NOTE) The Xpert Xpress SARS-CoV-2/FLU/RSV plus assay is intended as an aid in the diagnosis of influenza from Nasopharyngeal swab specimens and should not be used as a sole basis for treatment. Nasal washings and aspirates are unacceptable for Xpert Xpress SARS-CoV-2/FLU/RSV testing.  Fact Sheet for Patients: bloggercourse.com  Fact Sheet for Healthcare Providers: seriousbroker.it  This test is not yet approved or cleared by the [place]  [place] FDA and has been authorized for detection and/or diagnosis of SARS-CoV-2 by FDA under an Emergency Use Authorization (EUA). This EUA will remain in effect (meaning this test can be used) for the duration of the COVID-19 declaration under Section 564(b)(1) of the Act, 21 U.S.C. section 360bbb-3(b)(1), unless the authorization is terminated or revoked.     Resp Syncytial Virus by PCR NEGATIVE NEGATIVE Final    Comment: (NOTE) Fact Sheet for Patients: bloggercourse.com  Fact Sheet for Healthcare Providers: seriousbroker.it  This test is not yet approved or cleared by the [place]  FDA and has been authorized for detection and/or diagnosis of SARS-CoV-2 by FDA under an Emergency Use Authorization (EUA). This EUA will remain in effect (meaning this test can be used) for the duration of the COVID-19 declaration under Section 564(b)(1) of the Act, 21 U.S.C. section 360bbb-3(b)(1), unless the authorization is terminated or revoked.  Performed at [facility], [address] [place], [place] [address]      Time coordinating discharge:40 min  SIGNED:   Almarie KANDICE Hoots, MD  [facility] 08/23/2024, 3:05 PM     [1]  Allergies Allergen Reactions   Nitroglycerin  Other (See Comments)    Tongue swelling and difficulty swallowing. Hospitalized and underwent EGD in 06/2024   Dust  Mite Extract    Statins     Muscle aches   Tree Extract Other (See Comments)    Intolerance Grass

## 2024-08-24 ENCOUNTER — Other Ambulatory Visit (HOSPITAL_BASED_OUTPATIENT_CLINIC_OR_DEPARTMENT_OTHER): Payer: Self-pay

## 2024-08-24 ENCOUNTER — Other Ambulatory Visit: Payer: Self-pay

## 2024-08-27 ENCOUNTER — Telehealth: Payer: Self-pay

## 2024-08-27 NOTE — Transitions of Care (Post Inpatient/ED Visit) (Unsigned)
" ° °  08/27/2024  Name: TRUITT CRUEY MRN: [id number] DOB: [birth year]  Today's TOC FU Call Status: Today's TOC FU Call Status:: Unsuccessful Call (1st Attempt) Unsuccessful Call (1st Attempt) Date: 08/27/24  Attempted to reach the patient regarding the most recent Inpatient/ED visit.  Follow Up Plan: Additional outreach attempts will be made to reach the patient to complete the Transitions of Care (Post Inpatient/ED visit) call.   Signature  Charmaine Bloodgood, LPN [facility] Health Advisor [place] [facility] You Are. We Are. [facility] Direct Dial [phone]  "

## 2024-08-28 ENCOUNTER — Ambulatory Visit (INDEPENDENT_AMBULATORY_CARE_PROVIDER_SITE_OTHER): Payer: Medicare (Managed Care) | Admitting: Medical

## 2024-08-28 ENCOUNTER — Encounter: Payer: Self-pay | Admitting: Medical

## 2024-08-28 ENCOUNTER — Ambulatory Visit: Payer: Self-pay | Admitting: Medical

## 2024-08-28 VITALS — BP 118/72 | HR 64 | Temp 98.2°F | Resp 16 | Ht 65.0 in | Wt 222.2 lb

## 2024-08-28 DIAGNOSIS — E538 Deficiency of other specified B group vitamins: Secondary | ICD-10-CM

## 2024-08-28 DIAGNOSIS — G459 Transient cerebral ischemic attack, unspecified: Secondary | ICD-10-CM

## 2024-08-28 DIAGNOSIS — I10 Essential (primary) hypertension: Secondary | ICD-10-CM | POA: Diagnosis not present

## 2024-08-28 DIAGNOSIS — E559 Vitamin D deficiency, unspecified: Secondary | ICD-10-CM

## 2024-08-28 DIAGNOSIS — R0789 Other chest pain: Secondary | ICD-10-CM

## 2024-08-28 DIAGNOSIS — I480 Paroxysmal atrial fibrillation: Secondary | ICD-10-CM | POA: Diagnosis not present

## 2024-08-28 DIAGNOSIS — E785 Hyperlipidemia, unspecified: Secondary | ICD-10-CM

## 2024-08-28 LAB — TROPONIN I (HIGH SENSITIVITY): High Sens Troponin I: 4 ng/L (ref 2–17)

## 2024-08-28 NOTE — Progress Notes (Signed)
 "  Subjective:    Patient ID: Richard Walker, male    DOB: Nov 04, 1955, 69 y.o.   MRN: [id number]  HPI Pt in for follow from hospitalization   Admit date: 08/21/2024 Discharge date: 08/23/2024   Admitted From: home Disposition: home Recommendations for Outpatient Follow-up:  Follow up with PCP in 1-2 weeks Please obtain BMP/CBC in one week   Home Health:none Equipment/Devices:none Discharge Condition:stable CODE STATUS:full Diet recommendation: Heart Healthy / Carb Modified / Regular / Dysphagia    Brief/Interim Summary:68 y.o. male with medical history significant for CAD s/p DES to LAD (05/01/2024), PAF on Xarelto , SVT s/p ablation, HTN, HLD, GERD, prediabetes, chronic bronchitis, OSA on CPAP who presented to the ED evening of 08/21/2024 for evaluation of left facial droop.   Patient reports that he has been on Zepbound  prescribed by his primary care physician.  He says the dose was increased from 7.5 mg to 10 mg this past Monday (08/20/2024).  He was advised on potential adverse effects.  Patient states that after increasing the dosing he had been feeling generally unwell and fatigued.  Yesterday night around 8 PM his son noticed that he had drooping of his left lower face.  He did not have any difficulty with speech or slurring his words.  Patient notes that he had associated headache and blurry vision.  He also recalls having some numbness to his left lower face when compared to the right.   His son took him immediately to the emergency department.  Patient states that his facial drooping was still present on arrival but symptoms resolved after a few hours.  He does note some continued mild blurring of his vision.  He has not had any weakness in his extremities.   He reports adherence to his Xarelto  and Plavix .   [facility] ED Course  Labs/Imaging on admission: I have personally reviewed following labs and imaging studies.   Initial vitals showed BP 106/75, pulse 92, RR 18,  temp 98.0 F, SpO2 99% on room air.   Labs showed WBC 8.4, hemoglobin 15.1, platelets 243, sodium 138, potassium 3.9, bicarb 23, BUN 24, creatinine 1.22, serum glucose 86, LFTs within normal limits, troponin T <15.   SARS-CoV-2, influenza, RSV PCR negative.  UDS negative.  Hemoglobin A1c 5.4%.   CT head without contrast negative for acute intracranial abnormality.   CTA head/neck negative for LVO, hemodynamically significant stenosis, or aneurysm of the head or neck.   Teleneurology (Dr. Ana Cowing) were consulted and recommended admission for CVA workup.  Patient was restarted on home medications including Xarelto , Plavix , losartan , Toprol -XL.  The hospitalist service was consulted for admission.  Patient arrived to the floor at [facility] evening of 08/22/2024.     Discharge Diagnoses:  Principal Problem:   Transient neurologic deficit Active Problems:   Pure hypercholesterolemia   Paroxysmal atrial fibrillation (HCC)   Hypertension   OSA on CPAP   CAD (coronary artery disease)      Transient left facial droop: Patient presenting evening of 12/30 with left facial droop which resolved after several hours.  He had some associated numbness to his left lower face as well.  CT head was negative.  CTAopen head/neck negative for LVO or hemodynamically significant stenosis.   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  He was seen by duly neurology Dr. Cowing recommended to continue Xarelto  and Plavix  along with the statin he was taking prior  to admission.  There was a concern due to recent increased dose of tirzepatide  whether that had anything to do with his presentation.  So at the time of discharge this medication has been on hold.  He will follow-up with his PCP and restart Zepbound  probably at a lower dose and to monitor closely. Hemoglobin A1c LDL     Paroxysmal atrial fibrillation: He is in sinus rhythm with  controlled rate.  Continue Xarelto  and resumed Toprol -XL    CAD s/p DES to LAD 05/01/2024: Stable, denied chest pain.  Continue Xarelto , Plavix , rosuvastatin , Zetia .   Hypertension: Continue losartan  and Toprol -XL.   Hyperlipidemia: Continue rosuvastatin  and Zetia .    Chronic bronchitis: Continue Breztri  and albuterol  as needed.   GERD: Continue oral Protonix .   OSA: Continue CPAP      Estimated body mass index is 36.43 kg/m as calculated from the following:   Height as of this encounter: 5' 5 (1.651 m).   Weight as of this encounter: 99.3 kg.   Discharge Instructions   Discharge Instructions       Increase activity slowly   Complete by: As directed           Allergies as of 08/23/2024         Reactions    Nitroglycerin  Other (See Comments)    Tongue swelling and difficulty swallowing. Hospitalized and underwent EGD in 06/2024    Dust Mite Extract      Statins      Muscle aches    Tree Extract Other (See Comments)    Intolerance Grass            Medication List       STOP taking these medications     Zepbound  10 MG/0.5ML Pen Generic drug: tirzepatide            TAKE these medications     acetaminophen  325 MG tablet Commonly known as: TYLENOL  Take 2 tablets (650 mg total) by mouth every 4 (four) hours as needed for mild pain (pain score 1-3) or fever (or temp > 37.5 C (99.5 F)).    albuterol  108 (90 Base) MCG/ACT inhaler Commonly known as: ProAir  HFA Inhale 1-2 puffs into the lungs every 6 (six) hours as needed for wheezing or shortness of breath.    azelastine  0.1 % nasal spray Commonly known as: ASTELIN  Place 2 sprays into both nostrils 2 (two) times daily as needed for allergies.    Breztri  Aerosphere 160-9-4.8 MCG/ACT Aero inhaler Generic drug: budesonide -glycopyrrolate -formoterol  Inhale 2 puffs into the lungs in the morning and at bedtime.    cetirizine  10 MG tablet Commonly known as: ZyrTEC  Allergy  Take 1 tablet (10 mg total) by mouth  daily as needed for allergies.    cholecalciferol  25 MCG (1000 UNIT) tablet Commonly known as: VITAMIN D3 Take 3,000-4,000 Units by mouth daily.    clopidogrel  75 MG tablet Commonly known as: Plavix  Take 1 tablet (75 mg total) by mouth daily.    docusate sodium  100 MG capsule Commonly known as: Colace Take 1 capsule (100 mg total) by mouth 2 (two) times daily.    ezetimibe  10 MG tablet Commonly known as: ZETIA  TAKE 1 TABLET(10 MG) BY MOUTH DAILY    fenofibrate  160 MG tablet Take 1 tablet (160 mg total) by mouth daily. Start taking on: August 24, 2024 What changed:  medication strength how much to take    gabapentin  100 MG capsule Commonly known as: NEURONTIN  Take 1 capsule (100 mg total) by mouth 2 (  two) times daily.    losartan  25 MG tablet Commonly known as: COZAAR  Take 1 tablet (25 mg total) by mouth daily.    Magnesium  250 MG Tabs Take 250 mg by mouth once a week.    metFORMIN  500 MG tablet Commonly known as: GLUCOPHAGE  Take 1 tablet (500 mg total) by mouth 2 (two) times daily with a meal. What changed: when to take this    metoprolol  succinate 50 MG 24 hr tablet Commonly known as: TOPROL -XL Take 1 tablet (50 mg total) by mouth at bedtime. Take with or immediately following a meal.    pantoprazole  40 MG tablet Commonly known as: PROTONIX  Take 1 tablet (40 mg total) by mouth daily.    polyethylene glycol 17 g packet Commonly known as: MIRALAX  / GLYCOLAX  Take 17 g by mouth daily as needed for mild constipation.    rivaroxaban  20 MG Tabs tablet Commonly known as: Xarelto  Take 1 tablet (20 mg total) by mouth daily with supper.    rosuvastatin  20 MG tablet Commonly known as: Crestor  Take 1 tablet (20 mg total) by mouth daily.    sildenafil  100 MG tablet Commonly known as: VIAGRA  Take 1 tablet (100 mg total) by mouth daily as needed for erectile dysfunction.    Vitamin D  (Ergocalciferol ) 1.25 MG (50000 UNIT) Caps capsule Commonly known as: DRISDOL  Take  1 capsule (50,000 Units total) by mouth every 7 (seven) days.     Richard Walker is a 69 year old male with a history of TIA who presents with facial droop and fatigue.  He had a transient left lower facial droop first noted by his son around 6:30 PM after returning from the masjid. He had no speech difficulty. The droop resolved by the next day.  He felt markedly fatigued the day before and the day of the facial droop, severe enough that he lay down in the afternoon, which is unusual for him. He wonders if this is related to Zepbound .  He was admitted for TIA workup.. CT head without contrast and CT angiography were negative. MRI showed a small remote area of chronic ischemia.  He is taking Zetia , Crestor , Xarelto , Plavix , and Losartan  25 mg. His blood pressure is well controlled and his LDL decreased from 150 to 40.  He has coronary stents and follows with cardiology. A prior catheterization showed a 60% lesion treated with a stent. He has occasional sharp chest pain, often when lying on his left side, without shortness of breath, jaw pain, or sweating.  He has sleep apnea and uses CPAP but often removes it after a few hours due to sore throat and nasal discomfort.     Mentioned at very end of interview  Very atypical sharp pain surface of chest less than 5 minutes last night. Also some 2 nights ago as well.Pain more laying on left side. Positional. None presently. Also no associated cardiac type symptoms. No pressure, no shortness of breath. No jaw pain or sweating. Low level pain. Pt felt like pectoralis muscle.  Review of Systems  Constitutional:  Negative for chills and fatigue.  HENT:  Negative for congestion.   Respiratory:  Negative for apnea, cough and wheezing.   Cardiovascular:  Negative for chest pain and palpitations.       See hpi  Gastrointestinal:  Negative for abdominal pain.  Musculoskeletal:  Negative for back pain and neck pain.  Neurological:  Negative for  dizziness, speech difficulty, weakness and headaches.  Hematological:  Negative for adenopathy.  Psychiatric/Behavioral:  Negative for behavioral problems and decreased concentration. The patient is not nervous/anxious.     Past Medical History:  Diagnosis Date   Arthritis    DDD (degenerative disc disease), lumbar    L3/L4   Diverticulosis    Dysrhythmia    SVT s/p ablation   Dysrhythmia    PAF   GERD (gastroesophageal reflux disease)    Hyperlipidemia    Low sperm motility    Myocardial infarction (HCC)    Pre-diabetes    Sleep apnea    uses cpap    SVT (supraventricular tachycardia)      Social History   Socioeconomic History   Marital status: Married    Spouse name: Writer   Number of children: 4   Years of education: Not on file   Highest education level: Associate degree: occupational, scientist, product/process development, or vocational program  Occupational History   Occupation: retired  Tobacco Use   Smoking status: Former    Current packs/day: 0.00    Average packs/day: 1 pack/day for 10.0 years (10.0 ttl pk-yrs)    Types: Cigarettes    Start date: 90    Quit date: 1992    Years since quitting: 34.0    Passive exposure: Past   Smokeless tobacco: Never   Tobacco comments:    Uses [employer] Pipe occasionally  Vaping Use   Vaping status: Never Used  Substance and Sexual Activity   Alcohol use: No   Drug use: No   Sexual activity: Yes  Other Topics Concern   Not on file  Social History Narrative   Marital Status: Married (Feryal)Children:  4 (3 Sons/1 Daughter)Pets:  None Living Situation: Lives with spouse and 4 childrenOrigin:  He was born in [place].Occupation: Production Designer, 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  Alcohol Use:  NoneDrug Use:  NoneDiet:  RegularExercise:  NoneHobbies:  Cards   Retired    Social Drivers of Health   Tobacco Use: Medium Risk (08/28/2024)    Patient History    Smoking Tobacco Use: Former    Smokeless Tobacco Use: Never    Passive Exposure: Past  Physicist, Medical Strain: High Risk (06/11/2024)   Overall Financial Resource Strain (CARDIA)    Difficulty of Paying Living Expenses: Hard  Food Insecurity: Food Insecurity Present (08/22/2024)   Epic    Worried About Programme Researcher, Broadcasting/film/video in the Last Year: Sometimes true    Ran Out of Food in the Last Year: Sometimes true  Transportation Needs: No Transportation Needs (08/22/2024)   Epic    Lack of Transportation (Medical): No    Lack of Transportation (Non-Medical): No  Physical Activity: Insufficiently Active (06/11/2024)   Exercise Vital Sign    Days of Exercise per Week: 2 days    Minutes of Exercise per Session: 30 min  Stress: Stress Concern Present (06/11/2024)   [employer] of Occupational Health - Occupational Stress Questionnaire    Feeling of Stress: To some extent  Social Connections: Socially Integrated (08/22/2024)   Social Connection and Isolation Panel    Frequency of Communication with Friends and Family: More than three times a week    Frequency of Social Gatherings with Friends and Family: Once a week    Attends Religious Services: 1 to 4 times per year    Active Member of [employer] or Organizations: Yes    Attends Banker Meetings: 1 to 4 times per year    Marital Status: Married  Intimate Partner Violence: Not At Risk (08/22/2024)   Epic    Fear of Current or Ex-Partner: No    Emotionally Abused: No    Physically Abused: No    Sexually Abused: No  Depression (PHQ2-9): Medium Risk (06/12/2024)   Depression (PHQ2-9)    PHQ-2 Score: 9  Alcohol Screen: Low Risk (01/17/2024)   Alcohol Screen    Last Alcohol Screening Score (AUDIT): 0  Housing: High Risk (08/22/2024)   Epic    Unable to Pay for Housing in the Last Year: Yes    Number of Times Moved in the Last Year: 0    Homeless in the Last Year: No  Utilities: Not At Risk (08/22/2024)    Epic    Threatened with loss of utilities: No  Health Literacy: Adequate Health Literacy (01/18/2024)   B1300 Health Literacy    Frequency of need for help with medical instructions: Never    Past Surgical History:  Procedure Laterality Date   AMPUTATION Left 07/15/2018   Procedure: . REPAIR OF LEFT INDEX AND LONG FINGER BY FUSION /  REPAIR OF RING FINGER;  Surgeon: Sebastian Lenis, MD;  Location: [facility] OR;  Service: Orthopedics;  Laterality: Left;   BACK SURGERY     BLALOCK PROCEDURE     Ablation   CARDIAC CATHETERIZATION N/A 12/22/2015   Procedure: Left Heart Cath and Coronary Angiography;  Surgeon: Peter M Jordan, MD;  Location: [facility] INVASIVE CV LAB;  Service: Cardiovascular;  Laterality: N/A;   CARDIAC ELECTROPHYSIOLOGY STUDY AND ABLATION     CORONARY STENT INTERVENTION N/A 05/01/2024   Procedure: CORONARY STENT INTERVENTION;  Surgeon: Ladona Heinz, MD;  Location: [facility] INVASIVE CV LAB;  Service: Cardiovascular;  Laterality: N/A;   ELECTROPHYSIOLOGIC STUDY N/A 02/19/2016   Procedure: SVT Ablation;  Surgeon: Danelle LELON Birmingham, MD;  Location: [facility] INVASIVE CV LAB;  Service: Cardiovascular;  Laterality: N/A;   ESOPHAGOGASTRODUODENOSCOPY N/A 06/25/2024   Procedure: EGD (ESOPHAGOGASTRODUODENOSCOPY);  Surgeon: Albertus Heinz HERO, MD;  Location: [facility] ENDOSCOPY;  Service: Gastroenterology;  Laterality: N/A;   HAND SURGERY     LEFT HEART CATH AND CORONARY ANGIOGRAPHY N/A 05/01/2024   Procedure: LEFT HEART CATH AND CORONARY ANGIOGRAPHY;  Surgeon: Ladona Heinz, MD;  Location: [facility] INVASIVE CV LAB;  Service: Cardiovascular;  Laterality: N/A;   septal reconstruction  2017   TOTAL KNEE ARTHROPLASTY Left 03/27/2024   Procedure: ARTHROPLASTY, KNEE, TOTAL, LEFT;  Surgeon: Addie Cordella Hamilton, MD;  Location: [facility] OR;  Service: Orthopedics;  Laterality: Left;   TURBINATE REDUCTION Bilateral 07/2016   WISDOM TOOTH EXTRACTION      Family History  Problem Relation Age of Onset   Stroke Mother    Heart disease Mother    High  Cholesterol Mother    Sleep apnea Father    Hypertension Father    Food Allergy  Son    Colon cancer Neg Hx    Stomach cancer Neg Hx    Allergic rhinitis Neg Hx    Angioedema Neg Hx    Asthma Neg Hx    Eczema Neg Hx    Immunodeficiency Neg Hx    Urticaria Neg Hx     Allergies[1]  Medications Ordered Prior to Encounter[2]  BP 118/72 (BP Location: Left Arm, Patient Position: Sitting)   Pulse 64   Temp 98.2 F (36.8 C) (Oral)   Resp 16   Ht 5' 5 (1.651 m)   Wt 222 lb 3.2 oz (100.8 kg)   SpO2 98%   BMI 36.98 kg/m  Objective:   Physical Exam  General Mental Status- Alert. General Appearance- Not in acute distress.   Skin General: Color- Normal Color. Moisture- Normal Moisture.  Neck  No JVD.  Chest and Lung Exam Auscultation: Breath Sounds:-CTA  Cardiovascular Auscultation:Rythm- RRR Murmurs & Other Heart Sounds:Auscultation of the heart reveals- No Murmurs.  Abdomen Inspection:-Inspeection Normal. Palpation/Percussion:Note:No mass. Palpation and Percussion of the abdomen reveal- Non Tender, Non Distended + BS, no rebound or guarding.   Neurologic Cranial Nerve exam:- CN III-XII intact(No nystagmus), symmetric smile. Drift Test:- No drift. Finger to Nose:- Normal/Intact Strength:- 5/5 equal and symmetric strength both upper and lower extremities.    Lower ext- calfs symmetric, negative homans signs. No edema bilaterally.         Assessment & Plan:  [phone]    Patient Instructions  Transient ischemic attack Recent  likely TIA with left facial droop, resolved spontaneously. Imaging showed no acute findings, but chronic ischemic changes noted. Risk factors well controlled with current medications. - Continue Zetia , Crestor , Xarelto , and Plavix . - Referred to [facility] Neurology for further evaluation. - Discuss Zepbound  with wt loses MD during upcoming appointment. I have never seen this med associated with TIA.  Paroxysmal atrial  fibrillation Managed with current medications. No recent episodes reported. - Continue current management with Xarelto  and b blocker. metoprolol   Coronary artery disease status post stent placement Coronary artery disease with stent placement. Recent sharp chest pain, likely musculoskeletal, not cardiac in origin. No recent cardiac events reported. - Continue current cardiac medications. - Referred to Dr. Krasowski for cardiology follow-up. --as precaution decided to due 1 set stat troponin. Explained rare scenario and since no active pain for 14 hours will due just one set. For recurrent constant pain advise ED.  Hypertension Well controlled with losartan  25 mg. Blood pressure readings have improved. - Continue losartan  25 mg daily.  Hyperlipidemia Well controlled with current medications. Recent LDL level is 40, below target of 55. - Continue current lipid-lowering therapy.  Vitamin B12 deficiency Vitamin B12 deficiency. - Ordered B12 level.  Vitamin D  deficiency Vitamin D  deficiency. - Ordered vitamin D  level.  Atypical chest pain Intermittent sharp chest pain, likely musculoskeletal, not cardiac in origin. Pain is positional, occurring when lying on the left side. - Continue to monitor symptoms and report any changes. -as precaution decided to due 1 set stat troponin. Explained rare scenario and since no active pain for 14 hours will due just one set. For recurrent constant pain advise ED.  Follow up date to be determined after lab review        Referring To Provider Information [facility] [address] [place] [address] [phone]   Referring To Provider Information [facility] [address] [place] [address] [phone]         I personally spent a total of 45 minutes in the care of the patient today including getting/reviewing separately obtained history, performing a medically appropriate  exam/evaluation, counseling and educating, placing orders, referring and communicating with other health care professionals, and documenting clinical information in the EHR.       [1]  Allergies Allergen Reactions   Nitroglycerin  Other (See Comments)    Tongue swelling and difficulty swallowing. Hospitalized and underwent EGD in 06/2024   Dust Mite Extract    Statins     Muscle aches   Tree Extract Other (See Comments)    Intolerance Grass  [2]  Current Outpatient Medications on File Prior  to Visit  Medication Sig Dispense Refill   acetaminophen  (TYLENOL ) 325 MG tablet Take 2 tablets (650 mg total) by mouth every 4 (four) hours as needed for mild pain (pain score 1-3) or fever (or temp > 37.5 C (99.5 F)).     albuterol  (PROAIR  HFA) 108 (90 Base) MCG/ACT inhaler Inhale 1-2 puffs into the lungs every 6 (six) hours as needed for wheezing or shortness of breath. 24 g 3   azelastine  (ASTELIN ) 0.1 % nasal spray Place 2 sprays into both nostrils 2 (two) times daily as needed for allergies. 90 mL 1   budesonide -glycopyrrolate -formoterol  (BREZTRI  AEROSPHERE) 160-9-4.8 MCG/ACT AERO inhaler Inhale 2 puffs into the lungs in the morning and at bedtime. 10.7 g 10   cetirizine  (ZYRTEC  ALLERGY ) 10 MG tablet Take 1 tablet (10 mg total) by mouth daily as needed for allergies. 100 tablet 1   cholecalciferol  (VITAMIN D3) 25 MCG (1000 UNIT) tablet Take 3,000-4,000 Units by mouth daily.     clopidogrel  (PLAVIX ) 75 MG tablet Take 1 tablet (75 mg total) by mouth daily. 90 tablet 3   docusate sodium  (COLACE) 100 MG capsule Take 1 capsule (100 mg total) by mouth 2 (two) times daily. 30 capsule 0   ezetimibe  (ZETIA ) 10 MG tablet TAKE 1 TABLET(10 MG) BY MOUTH DAILY 90 tablet 3   fenofibrate  160 MG tablet Take 1 tablet (160 mg total) by mouth daily. 90 tablet 2   gabapentin  (NEURONTIN ) 100 MG capsule Take 1 capsule (100 mg total) by mouth 2 (two) times daily. 42 capsule 0   losartan  (COZAAR ) 25 MG tablet Take 1  tablet (25 mg total) by mouth daily. 90 tablet 0   Magnesium  250 MG TABS Take 250 mg by mouth once a week.     metFORMIN  (GLUCOPHAGE ) 500 MG tablet Take 1 tablet (500 mg total) by mouth 2 (two) times daily with a meal. (Patient taking differently: Take 500 mg by mouth daily.) 180 tablet 3   metoprolol  succinate (TOPROL -XL) 50 MG 24 hr tablet Take 1 tablet (50 mg total) by mouth at bedtime. Take with or immediately following a meal. 90 tablet 3   pantoprazole  (PROTONIX ) 40 MG tablet Take 1 tablet (40 mg total) by mouth daily. 30 tablet 2   polyethylene glycol (MIRALAX  / GLYCOLAX ) 17 g packet Take 17 g by mouth daily as needed for mild constipation.     rivaroxaban  (XARELTO ) 20 MG TABS tablet Take 1 tablet (20 mg total) by mouth daily with supper. 90 tablet 2   rosuvastatin  (CRESTOR ) 20 MG tablet Take 1 tablet (20 mg total) by mouth daily. 90 tablet 3   sildenafil  (VIAGRA ) 100 MG tablet Take 1 tablet (100 mg total) by mouth daily as needed for erectile dysfunction. 30 tablet 3   Vitamin D , Ergocalciferol , (DRISDOL ) 1.25 MG (50000 UNIT) CAPS capsule Take 1 capsule (50,000 Units total) by mouth every 7 (seven) days. 12 capsule 3   No current facility-administered medications on file prior to visit.   "

## 2024-08-28 NOTE — Patient Instructions (Addendum)
 Transient ischemic attack Recent  likely TIA with left facial droop, resolved spontaneously. Imaging showed no acute findings, but chronic ischemic changes noted. Risk factors well controlled with current medications. - Continue Zetia , Crestor , Xarelto , and Plavix . - Referred to [facility] for further evaluation. - Discuss Zepbound  with wt loses MD during upcoming appointment. I have never seen this med associated with TIA.  Paroxysmal atrial fibrillation Managed with current medications. No recent episodes reported. - Continue current management with Xarelto  and b blocker. metoprolol   Coronary artery disease status post stent placement Coronary artery disease with stent placement. Recent sharp chest pain, likely musculoskeletal, not cardiac in origin. No recent cardiac events reported. - Continue current cardiac medications. - Referred to Dr. Krasowski for cardiology follow-up. --as precaution decided to due 1 set stat troponin. Explained rare scenario and since no active pain for 14 hours will due just one set. For recurrent constant pain advise ED.  Hypertension Well controlled with losartan  25 mg. Blood pressure readings have improved. - Continue losartan  25 mg daily.  Hyperlipidemia Well controlled with current medications. Recent LDL level is 40, below target of 55. - Continue current lipid-lowering therapy.  Vitamin B12 deficiency Vitamin B12 deficiency. - Ordered B12 level.  Vitamin D  deficiency Vitamin D  deficiency. - Ordered vitamin D  level.  Atypical chest pain Intermittent sharp chest pain, likely musculoskeletal, not cardiac in origin. Pain is positional, occurring when lying on the left side. - Continue to monitor symptoms and report any changes. -as precaution decided to due 1 set stat troponin. Explained rare scenario and since no active pain for 14 hours will due just one set. For recurrent constant pain advise ED.  Follow up date to be determined after  lab review        Referring To Provider Information [facility] [address] [place] [address] [phone]   Referring To Provider Information [facility] LAURINE SAILOR [place] [address] [phone]

## 2024-08-29 ENCOUNTER — Other Ambulatory Visit (INDEPENDENT_AMBULATORY_CARE_PROVIDER_SITE_OTHER): Payer: Medicare (Managed Care)

## 2024-08-29 ENCOUNTER — Ambulatory Visit: Payer: Medicare (Managed Care) | Admitting: Orthopedic Surgery

## 2024-08-29 ENCOUNTER — Other Ambulatory Visit (HOSPITAL_BASED_OUTPATIENT_CLINIC_OR_DEPARTMENT_OTHER): Payer: Self-pay

## 2024-08-29 ENCOUNTER — Ambulatory Visit: Payer: Medicare (Managed Care) | Admitting: Medical

## 2024-08-29 ENCOUNTER — Ambulatory Visit (INDEPENDENT_AMBULATORY_CARE_PROVIDER_SITE_OTHER): Payer: Medicare (Managed Care) | Admitting: Internal Medicine

## 2024-08-29 ENCOUNTER — Other Ambulatory Visit: Payer: Self-pay | Admitting: Medical

## 2024-08-29 ENCOUNTER — Other Ambulatory Visit: Payer: Self-pay

## 2024-08-29 ENCOUNTER — Encounter (INDEPENDENT_AMBULATORY_CARE_PROVIDER_SITE_OTHER): Payer: Self-pay | Admitting: Internal Medicine

## 2024-08-29 VITALS — BP 103/68 | HR 72 | Temp 97.7°F | Ht 68.0 in | Wt 217.0 lb

## 2024-08-29 DIAGNOSIS — E538 Deficiency of other specified B group vitamins: Secondary | ICD-10-CM | POA: Diagnosis not present

## 2024-08-29 DIAGNOSIS — E785 Hyperlipidemia, unspecified: Secondary | ICD-10-CM | POA: Diagnosis not present

## 2024-08-29 DIAGNOSIS — G4733 Obstructive sleep apnea (adult) (pediatric): Secondary | ICD-10-CM

## 2024-08-29 DIAGNOSIS — G459 Transient cerebral ischemic attack, unspecified: Secondary | ICD-10-CM | POA: Diagnosis not present

## 2024-08-29 DIAGNOSIS — E66811 Obesity, class 1: Secondary | ICD-10-CM

## 2024-08-29 DIAGNOSIS — I10 Essential (primary) hypertension: Secondary | ICD-10-CM | POA: Diagnosis not present

## 2024-08-29 DIAGNOSIS — Z8673 Personal history of transient ischemic attack (TIA), and cerebral infarction without residual deficits: Secondary | ICD-10-CM

## 2024-08-29 DIAGNOSIS — K760 Fatty (change of) liver, not elsewhere classified: Secondary | ICD-10-CM | POA: Diagnosis not present

## 2024-08-29 DIAGNOSIS — E559 Vitamin D deficiency, unspecified: Secondary | ICD-10-CM | POA: Diagnosis not present

## 2024-08-29 DIAGNOSIS — R739 Hyperglycemia, unspecified: Secondary | ICD-10-CM

## 2024-08-29 DIAGNOSIS — Z6832 Body mass index (BMI) 32.0-32.9, adult: Secondary | ICD-10-CM | POA: Diagnosis not present

## 2024-08-29 LAB — CBC WITH DIFFERENTIAL/PLATELET
Basophils Absolute: 0 K/uL (ref 0.0–0.1)
Basophils Relative: 0.6 % (ref 0.0–3.0)
Eosinophils Absolute: 0.1 K/uL (ref 0.0–0.7)
Eosinophils Relative: 1.9 % (ref 0.0–5.0)
HCT: 43.7 % (ref 39.0–52.0)
Hemoglobin: 14.5 g/dL (ref 13.0–17.0)
Lymphocytes Relative: 27.4 % (ref 12.0–46.0)
Lymphs Abs: 1.6 K/uL (ref 0.7–4.0)
MCHC: 33.2 g/dL (ref 30.0–36.0)
MCV: 82.9 fl (ref 78.0–100.0)
Monocytes Absolute: 0.4 K/uL (ref 0.1–1.0)
Monocytes Relative: 7.8 % (ref 3.0–12.0)
Neutro Abs: 3.6 K/uL (ref 1.4–7.7)
Neutrophils Relative %: 62.3 % (ref 43.0–77.0)
Platelets: 202 K/uL (ref 150.0–400.0)
RBC: 5.27 Mil/uL (ref 4.22–5.81)
RDW: 15.3 % (ref 11.5–15.5)
WBC: 5.8 K/uL (ref 4.0–10.5)

## 2024-08-29 LAB — COMPREHENSIVE METABOLIC PANEL WITH GFR
ALT: 11 U/L (ref 3–53)
AST: 14 U/L (ref 5–37)
Albumin: 4.1 g/dL (ref 3.5–5.2)
Alkaline Phosphatase: 60 U/L (ref 39–117)
BUN: 24 mg/dL — ABNORMAL HIGH (ref 6–23)
CO2: 27 meq/L (ref 19–32)
Calcium: 9.5 mg/dL (ref 8.4–10.5)
Chloride: 104 meq/L (ref 96–112)
Creatinine, Ser: 0.96 mg/dL (ref 0.40–1.50)
GFR: 81.39 mL/min
Glucose, Bld: 89 mg/dL (ref 70–99)
Potassium: 4.1 meq/L (ref 3.5–5.1)
Sodium: 137 meq/L (ref 135–145)
Total Bilirubin: 0.5 mg/dL (ref 0.2–1.2)
Total Protein: 6.5 g/dL (ref 6.0–8.3)

## 2024-08-29 LAB — HEMOGLOBIN A1C: Hgb A1c MFr Bld: 5.4 % (ref 4.6–6.5)

## 2024-08-29 LAB — VITAMIN D 25 HYDROXY (VIT D DEFICIENCY, FRACTURES): VITD: 44.29 ng/mL (ref 30.00–100.00)

## 2024-08-29 LAB — VITAMIN B12: Vitamin B-12: 323 pg/mL (ref 211–911)

## 2024-08-29 MED ORDER — PANTOPRAZOLE SODIUM 40 MG PO TBEC
40.0000 mg | DELAYED_RELEASE_TABLET | Freq: Every day | ORAL | 0 refills | Status: DC
  Filled 2024-08-29: qty 90, 90d supply, fill #0

## 2024-08-29 MED ORDER — TIRZEPATIDE-WEIGHT MANAGEMENT 10 MG/0.5ML ~~LOC~~ SOAJ
10.0000 mg | SUBCUTANEOUS | 0 refills | Status: DC
  Filled 2024-08-29: qty 2, 28d supply, fill #0

## 2024-08-29 NOTE — Assessment & Plan Note (Signed)
 Weight: decrease of 50 lb (18.7%) over 11 months, 2 weeks  Start: 09/14/2023 267 lb (121.1 kg)  End: 08/29/2024 217 lb (98.4 kg)  Recent hospitalization for symptoms suggestive of a transient ischemic attack (TIA), likely related to cardiovascular risk factors rather than medication. Zepbound  was paused due to concerns about potential side effects, but no direct link to the TIA was established.  He does report some fatigue which I think is related to blood pressure fluctuations noted, possibly related to medication and weight loss. - Restart Zepbound  at 10 mg dose. - Monitor blood pressure and blood sugar levels regularly. - Hold losartan  to prevent hypotension.

## 2024-08-29 NOTE — Assessment & Plan Note (Signed)
 I reviewed details from hospitalization including imaging studies and blood work.  His symptoms have resolved.  His blood pressure is on the low normal side likely due to cardiac medications as well as significant weight loss.  I suggest that he hold losartan  and to start monitoring his blood pressure at home in the morning and also before bedtime.

## 2024-08-29 NOTE — Assessment & Plan Note (Signed)
 Blood pressure management complicated by recent weight loss and medication adjustments. Current blood pressure readings are lower than desired, possibly due to Zepbound  and losartan . He reports fatigue and low blood pressure readings, suggesting a need for medication adjustment. - Held losartan  to prevent further hypotension. - Monitor blood pressure regularly. - Adjust blood pressure medications as needed based on weight loss and blood pressure readings.

## 2024-08-29 NOTE — Addendum Note (Signed)
 Addended by: TRUDY CURVIN RAMAN on: 08/29/2024 09:18 AM   Modules accepted: Orders

## 2024-08-29 NOTE — Assessment & Plan Note (Signed)
 Patient reports intermittent use of CPAP he has lost an amount of weight and does not feel he needs it anymore he reports sleeping well and is asymptomatic.  Consider repeating sleep study to see if he still needs PAP therapy considering comorbidities.

## 2024-08-29 NOTE — Progress Notes (Signed)
 "  Office: [phone]  /  Fax: [phone]  Weight Summary and Body Composition Analysis (BIA)  Vitals Temp: 97.7 F (36.5 C) BP: 103/68 Pulse Rate: 72 SpO2: 98 %   Anthropometric Measurements Height: 5' 8 (1.727 m) Weight: 217 lb (98.4 kg) BMI (Calculated): 33 Weight at Last Visit: 215lb Weight Lost Since Last Visit: 0 lb Weight Gained Since Last Visit: 2 lb Starting Weight: 264 lb Total Weight Loss (lbs): 47 lb (21.3 kg) Peak Weight: 270 lb   Body Composition  Body Fat %: 31.6 % Fat Mass (lbs): 68.6 lbs Muscle Mass (lbs): 141.4 lbs Total Body Water  (lbs): 107.2 lbs Visceral Fat Rating : 19    RMR: 2405  Today's Visit #: 12  Starting Date: 11/02/23   Subjective   Chief Complaint: Obesity  Interval History  Discussed the use of AI scribe software for clinical note transcription with the patient, who gave verbal consent to proceed.  History of Present Illness Richard Walker is a 68 year old male who presents for medical weight management.  He was recently in the hospital for transient ischemic attack who presents with concerns about medication management and recent symptoms of fatigue and facial drooping.  He has been experiencing significant fatigue and facial drooping on the left side, which led to a hospital visit. During his hospitalization, he underwent extensive diagnostic testing, including X-rays, MRI, and CT angiogram.  He denies missing any doses of his blood thinners, Xarelto  and Plavix , which he takes daily. He also takes metoprolol  for atrial fibrillation, which has not been problematic for a long time. He recalls a previous episode two months ago involving slurred speech and difficulty swallowing, initially thought to be medication-related but later ruled out.  He has been on Zepbound , initially at a dose of 7.5 mg, which was increased to 10 mg. Since stopping the medication a week ago, he has noticed changes in his appetite and weight.   He  regularly monitors his blood pressure and blood sugar, noting that his blood pressure has been low, with readings around 102/65 mmHg. He is concerned about his blood pressure being too low and mentions taking losartan  for hypertension.     Challenges affecting patient progress: strong hunger signals and/or impaired satiety / inhibitory control and recent hospitalization.    Pharmacotherapy for weight management: He is currently taking : Zepbound  10 mg once a week which was recently held in the hospital.   Assessment and Plan   Treatment Plan For Obesity:  Recommended Dietary Goals  Gerald is currently in the action stage of change. As such, his goal is to continue weight management plan. He has agreed to: continue current plan  Behavioral Health and Counseling  We discussed the following behavioral modification strategies today: continue to work on maintaining a reduced calorie state, getting the recommended amount of protein, incorporating whole foods, making healthy choices, staying well hydrated and practicing mindfulness when eating. and increase protein intake, fibrous foods (25 grams per day for women, 30 grams for men) and water  to improve satiety and decrease hunger signals. .  Additional education and resources provided today: None  Recommended Physical Activity Goals  Quinterius has been advised to work up to 150 minutes of moderate intensity aerobic activity a week and strengthening exercises 2-3 times per week for cardiovascular health, weight loss maintenance and preservation of muscle mass.  He has agreed to :  Think about enjoyable ways to increase daily physical activity and overcoming barriers to exercise, Increase  physical activity in their day and reduce sedentary time (increase NEAT)., Increase volume of physical activity to a goal of 240 minutes a week, and Combine aerobic and strengthening exercises for efficiency and improved cardiometabolic health.  Medical Interventions  and Pharmacotherapy  We discussed various medication options to help Uvaldo with his weight loss efforts and we both agreed to : We discussed that his symptoms are consistent with a TIA blood sugars were normal upon arrival.  He has multiple cardiovascular risk and is on several medications to mitigate this.  He may continue treatment with GLP-1  Associated Conditions Impacted by Obesity Treatment  Assessment & Plan OSA on CPAP Patient reports intermittent use of CPAP he has lost an amount of weight and does not feel he needs it anymore he reports sleeping well and is asymptomatic.  Consider repeating sleep study to see if he still needs PAP therapy considering comorbidities. Class 1 obesity with serious comorbidity and body mass index (BMI) of 32.0 to 32.9 in adult, unspecified obesity type Weight: decrease of 50 lb (18.7%) over 11 months, 2 weeks  Start: 09/14/2023 267 lb (121.1 kg)  End: 08/29/2024 217 lb (98.4 kg)  Recent hospitalization for symptoms suggestive of a transient ischemic attack (TIA), likely related to cardiovascular risk factors rather than medication. Zepbound  was paused due to concerns about potential side effects, but no direct link to the TIA was established.  He does report some fatigue which I think is related to blood pressure fluctuations noted, possibly related to medication and weight loss. - Restart Zepbound  at 10 mg dose. - Monitor blood pressure and blood sugar levels regularly. - Hold losartan  to prevent hypotension.  Metabolic dysfunction-associated steatotic liver disease (MASLD) He has hepatic steatosis on CT scan completed in January of this year.  Fibrosis 4 Score = 1.3  He does not drink alcohol and is not on steatogenic medication.  He has lost 19 %  He is also working on reducing saturated fats simple and processed sugars in his diet and is engaging in physical activity.  Continue current weight management strategy inclusive of GLP-1  Hx of transient  ischemic attack (TIA) I reviewed details from hospitalization including imaging studies and blood work.  His symptoms have resolved.  His blood pressure is on the low normal side likely due to cardiac medications as well as significant weight loss.  I suggest that he hold losartan  and to start monitoring his blood pressure at home in the morning and also before bedtime. Primary hypertension Blood pressure management complicated by recent weight loss and medication adjustments. Current blood pressure readings are lower than desired, possibly due to Zepbound  and losartan . He reports fatigue and low blood pressure readings, suggesting a need for medication adjustment. - Held losartan  to prevent further hypotension. - Monitor blood pressure regularly. - Adjust blood pressure medications as needed based on weight loss and blood pressure readings.         Objective   Physical Exam:  Blood pressure 103/68, pulse 72, temperature 97.7 F (36.5 C), height 5' 8 (1.727 m), weight 217 lb (98.4 kg), SpO2 98%. Body mass index is 32.99 kg/m.  General: He is overweight, cooperative, alert, well developed, and in no acute distress. PSYCH: Has normal mood, affect and thought process.   HEENT: EOMI, sclerae are anicteric. Lungs: Normal breathing effort, no conversational dyspnea. Extremities: No edema.  Neurologic: No gross sensory or motor deficits. No tremors or fasciculations noted.    Diagnostic Data Reviewed:  BMET  Component Value Date/Time   NA 137 08/29/2024 0916   NA 138 04/26/2024 1232   K 4.1 08/29/2024 0916   CL 104 08/29/2024 0916   CO2 27 08/29/2024 0916   GLUCOSE 89 08/29/2024 0916   BUN 24 (H) 08/29/2024 0916   BUN 14 04/26/2024 1232   CREATININE 0.96 08/29/2024 0916   CREATININE 1.02 06/27/2024 1534   CALCIUM  9.5 08/29/2024 0916   GFRNONAA >60 08/23/2024 0649   GFRNONAA >89 03/04/2014 1456   GFRAA >60 07/15/2018 1324   GFRAA >89 03/04/2014 1456   Lab Results   Component Value Date   HGBA1C 5.4 08/29/2024   HGBA1C 5.7 12/24/2015   Lab Results  Component Value Date   INSULIN  25.3 (H) 11/03/2023   Lab Results  Component Value Date   TSH 1.81 01/20/2024   CBC    Component Value Date/Time   WBC 5.8 08/29/2024 0916   RBC 5.27 08/29/2024 0916   HGB 14.5 08/29/2024 0916   HGB 14.5 05/16/2024 1515   HCT 43.7 08/29/2024 0916   HCT 44.9 05/16/2024 1515   PLT 202.0 08/29/2024 0916   PLT 244 05/16/2024 1515   MCV 82.9 08/29/2024 0916   MCV 87 05/16/2024 1515   MCH 27.4 08/23/2024 0649   MCHC 33.2 08/29/2024 0916   RDW 15.3 08/29/2024 0916   RDW 13.2 05/16/2024 1515   Iron Studies    Component Value Date/Time   IRON 69 06/28/2018 1611   Lipid Panel     Component Value Date/Time   CHOL 120 06/12/2024 1142   TRIG 98.0 06/12/2024 1142   HDL 39.50 06/12/2024 1142   CHOLHDL 3 06/12/2024 1142   VLDL 19.6 06/12/2024 1142   LDLCALC 61 06/12/2024 1142   LDLDIRECT 40 08/22/2024 0812   Hepatic Function Panel     Component Value Date/Time   PROT 6.5 08/29/2024 0916   PROT 6.9 11/03/2023 1117   ALBUMIN 4.1 08/29/2024 0916   ALBUMIN 4.3 11/03/2023 1117   AST 14 08/29/2024 0916   ALT 11 08/29/2024 0916   ALKPHOS 60 08/29/2024 0916   BILITOT 0.5 08/29/2024 0916   BILITOT 0.3 11/03/2023 1117   BILIDIR 0.1 01/06/2017 1600      Component Value Date/Time   TSH 1.81 01/20/2024 1057   Nutritional Lab Results  Component Value Date   VD25OH 44.29 08/29/2024   VD25OH 48.78 06/27/2024   VD25OH 55.49 01/20/2024    Medications: Outpatient Encounter Medications as of 08/29/2024  Medication Sig   acetaminophen  (TYLENOL ) 325 MG tablet Take 2 tablets (650 mg total) by mouth every 4 (four) hours as needed for mild pain (pain score 1-3) or fever (or temp > 37.5 C (99.5 F)).   albuterol  (PROAIR  HFA) 108 (90 Base) MCG/ACT inhaler Inhale 1-2 puffs into the lungs every 6 (six) hours as needed for wheezing or shortness of breath.   azelastine   (ASTELIN ) 0.1 % nasal spray Place 2 sprays into both nostrils 2 (two) times daily as needed for allergies.   budesonide -glycopyrrolate -formoterol  (BREZTRI  AEROSPHERE) 160-9-4.8 MCG/ACT AERO inhaler Inhale 2 puffs into the lungs in the morning and at bedtime.   cetirizine  (ZYRTEC  ALLERGY ) 10 MG tablet Take 1 tablet (10 mg total) by mouth daily as needed for allergies.   cholecalciferol  (VITAMIN D3) 25 MCG (1000 UNIT) tablet Take 3,000-4,000 Units by mouth daily.   clopidogrel  (PLAVIX ) 75 MG tablet Take 1 tablet (75 mg total) by mouth daily.   docusate sodium  (COLACE) 100 MG capsule Take 1 capsule (100 mg total)  by mouth 2 (two) times daily.   ezetimibe  (ZETIA ) 10 MG tablet TAKE 1 TABLET(10 MG) BY MOUTH DAILY   fenofibrate  160 MG tablet Take 1 tablet (160 mg total) by mouth daily.   gabapentin  (NEURONTIN ) 100 MG capsule Take 1 capsule (100 mg total) by mouth 2 (two) times daily.   losartan  (COZAAR ) 25 MG tablet Take 1 tablet (25 mg total) by mouth daily.   Magnesium  250 MG TABS Take 250 mg by mouth once a week.   metFORMIN  (GLUCOPHAGE ) 500 MG tablet Take 1 tablet (500 mg total) by mouth 2 (two) times daily with a meal. (Patient taking differently: Take 500 mg by mouth daily.)   metoprolol  succinate (TOPROL -XL) 50 MG 24 hr tablet Take 1 tablet (50 mg total) by mouth at bedtime. Take with or immediately following a meal.   pantoprazole  (PROTONIX ) 40 MG tablet Take 1 tablet (40 mg total) by mouth daily.   polyethylene glycol (MIRALAX  / GLYCOLAX ) 17 g packet Take 17 g by mouth daily as needed for mild constipation.   rivaroxaban  (XARELTO ) 20 MG TABS tablet Take 1 tablet (20 mg total) by mouth daily with supper.   rosuvastatin  (CRESTOR ) 20 MG tablet Take 1 tablet (20 mg total) by mouth daily.   sildenafil  (VIAGRA ) 100 MG tablet Take 1 tablet (100 mg total) by mouth daily as needed for erectile dysfunction.   tirzepatide  (ZEPBOUND ) 10 MG/0.5ML Pen Inject 10 mg into the skin once a week.   Vitamin D ,  Ergocalciferol , (DRISDOL ) 1.25 MG (50000 UNIT) CAPS capsule Take 1 capsule (50,000 Units total) by mouth every 7 (seven) days.   [DISCONTINUED] pantoprazole  (PROTONIX ) 40 MG tablet Take 1 tablet (40 mg total) by mouth daily.   No facility-administered encounter medications on file as of 08/29/2024.     Follow-Up   Return in about 4 weeks (around 09/26/2024) for For Weight Mangement with Dr. Francyne.SABRA He was informed of the importance of frequent follow up visits to maximize his success with intensive lifestyle modifications for his multiple health conditions.  Attestation Statement   Reviewed by clinician on day of visit: allergies, medications, problem list, medical history, surgical history, family history, social history, and previous encounter notes.     Lucas Francyne, MD  "

## 2024-08-29 NOTE — Assessment & Plan Note (Signed)
 He has hepatic steatosis on CT scan completed in January of this year.  Fibrosis 4 Score = 1.3  He does not drink alcohol and is not on steatogenic medication.  He has lost 19 %  He is also working on reducing saturated fats simple and processed sugars in his diet and is engaging in physical activity.  Continue current weight management strategy inclusive of GLP-1

## 2024-08-30 ENCOUNTER — Ambulatory Visit: Admit: 2024-08-30 | Payer: Medicare (Managed Care) | Admitting: Orthopedic Surgery

## 2024-08-31 ENCOUNTER — Other Ambulatory Visit (HOSPITAL_BASED_OUTPATIENT_CLINIC_OR_DEPARTMENT_OTHER): Payer: Self-pay

## 2024-09-09 ENCOUNTER — Encounter: Payer: Self-pay | Admitting: Orthopedic Surgery

## 2024-09-12 ENCOUNTER — Telehealth: Payer: Self-pay | Admitting: Orthopedic Surgery

## 2024-09-12 NOTE — Telephone Encounter (Signed)
 Thomas from [employer] called wanting more information for the prior authorization. Call back number is [phone].

## 2024-09-12 NOTE — Telephone Encounter (Signed)
 Noted.

## 2024-09-12 NOTE — Telephone Encounter (Signed)
Additional information faxed to [facility].

## 2024-09-14 ENCOUNTER — Encounter: Payer: Medicare (Managed Care) | Admitting: Surgical

## 2024-09-25 ENCOUNTER — Encounter (INDEPENDENT_AMBULATORY_CARE_PROVIDER_SITE_OTHER): Payer: Self-pay | Admitting: Internal Medicine

## 2024-09-25 ENCOUNTER — Ambulatory Visit (INDEPENDENT_AMBULATORY_CARE_PROVIDER_SITE_OTHER): Payer: Medicare (Managed Care) | Admitting: Internal Medicine

## 2024-09-25 ENCOUNTER — Encounter (HOSPITAL_BASED_OUTPATIENT_CLINIC_OR_DEPARTMENT_OTHER): Payer: Self-pay

## 2024-09-25 ENCOUNTER — Other Ambulatory Visit (HOSPITAL_BASED_OUTPATIENT_CLINIC_OR_DEPARTMENT_OTHER): Payer: Self-pay

## 2024-09-25 ENCOUNTER — Other Ambulatory Visit: Payer: Self-pay

## 2024-09-25 ENCOUNTER — Other Ambulatory Visit: Payer: Self-pay | Admitting: Medical

## 2024-09-25 VITALS — BP 95/60 | HR 72 | Temp 97.5°F | Ht 68.0 in | Wt 217.0 lb

## 2024-09-25 DIAGNOSIS — K760 Fatty (change of) liver, not elsewhere classified: Secondary | ICD-10-CM | POA: Diagnosis not present

## 2024-09-25 DIAGNOSIS — Z8673 Personal history of transient ischemic attack (TIA), and cerebral infarction without residual deficits: Secondary | ICD-10-CM | POA: Diagnosis not present

## 2024-09-25 DIAGNOSIS — Z6832 Body mass index (BMI) 32.0-32.9, adult: Secondary | ICD-10-CM

## 2024-09-25 DIAGNOSIS — G4733 Obstructive sleep apnea (adult) (pediatric): Secondary | ICD-10-CM | POA: Diagnosis not present

## 2024-09-25 DIAGNOSIS — R7303 Prediabetes: Secondary | ICD-10-CM

## 2024-09-25 DIAGNOSIS — E66811 Obesity, class 1: Secondary | ICD-10-CM

## 2024-09-25 DIAGNOSIS — I10 Essential (primary) hypertension: Secondary | ICD-10-CM

## 2024-09-25 MED ORDER — ZONISAMIDE 25 MG PO CAPS
25.0000 mg | ORAL_CAPSULE | Freq: Every day | ORAL | 1 refills | Status: AC
  Filled 2024-09-25: qty 30, 30d supply, fill #0

## 2024-09-25 MED ORDER — PANTOPRAZOLE SODIUM 40 MG PO TBEC
40.0000 mg | DELAYED_RELEASE_TABLET | Freq: Every day | ORAL | 0 refills | Status: AC
  Filled 2024-09-25: qty 90, 90d supply, fill #0

## 2024-09-25 MED ORDER — TIRZEPATIDE-WEIGHT MANAGEMENT 10 MG/0.5ML ~~LOC~~ SOAJ
10.0000 mg | SUBCUTANEOUS | 0 refills | Status: AC
  Filled 2024-09-25: qty 2, 28d supply, fill #0

## 2024-09-25 NOTE — Assessment & Plan Note (Signed)
 MASLD related to obesity and insulin  resistance.  Management is focused on sustained weight loss and metabolic optimization, which are expected to improve hepatic steatosis and inflammation.  Continue current management inclusive of GLP-1; monitor liver enzymes and fibrosis risk over time.

## 2024-09-25 NOTE — Assessment & Plan Note (Signed)
 Blood pressure has decreased significantly, with readings now ranging from 102 to 115 mmHg. Previously on losartan , which has been discontinued. Discussed the potential impact of weight loss and medication adjustments on blood pressure. - Continue to monitor blood pressure regularly. - Hold off increasing tirzepatide  as it may lower blood pressure more.

## 2024-09-25 NOTE — Assessment & Plan Note (Signed)
 Lab Results  Component Value Date   HGBA1C 5.4 08/29/2024   HGBA1C 5.4 08/22/2024   HGBA1C 5.4 06/12/2024   Stable at this time. Glycemic control is being addressed through the weight management plan above inclusive of GLP-1, with expected improvement in insulin  resistance and metabolic parameters as weight loss progresses. Will continue to monitor A1c and glucose trends.

## 2024-09-25 NOTE — Assessment & Plan Note (Signed)
 No recurrent symptoms.  Blood pressure is adequately controlled.  Continue lipid-lowering therapy.  Continue Zepbound  for cardiovascular risk reduction

## 2024-09-27 ENCOUNTER — Other Ambulatory Visit (HOSPITAL_BASED_OUTPATIENT_CLINIC_OR_DEPARTMENT_OTHER): Payer: Self-pay

## 2024-09-27 ENCOUNTER — Ambulatory Visit: Payer: Self-pay | Admitting: Neurology

## 2024-09-27 ENCOUNTER — Encounter: Payer: Self-pay | Admitting: Neurology

## 2024-09-27 VITALS — BP 139/87 | HR 76 | Ht 68.0 in | Wt 221.0 lb

## 2024-09-27 DIAGNOSIS — G459 Transient cerebral ischemic attack, unspecified: Secondary | ICD-10-CM | POA: Insufficient documentation

## 2024-09-27 DIAGNOSIS — R634 Abnormal weight loss: Secondary | ICD-10-CM | POA: Insufficient documentation

## 2024-09-27 DIAGNOSIS — G4733 Obstructive sleep apnea (adult) (pediatric): Secondary | ICD-10-CM | POA: Diagnosis not present

## 2024-09-27 NOTE — Progress Notes (Signed)
 "        Provider:  Dedra Gores, MD  Primary Care Physician:  Saguier, Edward, PA-C [address] [place] [address]     Referring Provider: Dorina Loving, Pa-c [address] [place],  [place] [address]          Chief Complaint according to patient   Patient presents with:                HISTORY OF PRESENT ILLNESS:  Richard Walker is a 69 y.o. male patient who is here for revisit 09/27/2024 for  CPAP compliance. He has lost almost 50 pounds on Zepbound  and now reports aerophagia.  We may to recheck his need for CPAP and current level of apnea. He reports another problem:  a possible TIA in  Had total knee replacement  by Richard Addie, weight loss has given him less SOB, more ROM and Richard Francyne  helped him with nutriton and with zepbound . He is waiting for the second total knee replacement.  He is in cardiac rehab .  S/p Coronary artery stenting.  Richard Ladona is his cardiology ,  had paroxysmal atrial fib.  Had an ablation. He remains on plavix .  Primary HTN.  HBa1c is 5.4 from 6.4 !!   Chief concern according to patient :  look at my ED report , I had a TIA and was 3 days in the hospital.   12/ 30 / 2026 Chief Complaint: Left facial droop   HPI: Richard Walker is a 69 y.o. male with medical history significant for CAD s/p DES to LAD (05/01/2024), PAF on Xarelto , SVT s/p ablation, HTN, HLD, GERD, prediabetes, chronic bronchitis, OSA on CPAP who presented to the ED evening of 08/21/2024 for evaluation of left facial droop.   Patient reports that he has been on Zepbound  prescribed by his primary care physician.  He says the dose was increased from 7.5 mg to 10 mg this past Monday (08/20/2024).  He was advised on potential adverse effects.  Patient states that after increasing the dosing he had been feeling generally unwell and fatigued.  Yesterday night around 8 PM his son noticed that he had drooping of his left lower face.  He did not have any  difficulty with speech or slurring his words.  Patient notes that he had associated headache and blurry vision.  He also recalls having some numbness to his left lower face when compared to the right.   His son took him immediately to the emergency department.  Patient states that his facial drooping was still present on arrival but symptoms resolved after a few hours.  He does note some continued mild blurring of his vision.  He has not had any weakness in his extremities.   He reports adherence to his Xarelto  and Plavix . Patient presenting evening of 12/30 with left facial droop which resolved after several hours.  He had some associated numbness to his left lower face as well.  CT head was negative.  CTAopen head/neck negative for LVO or hemodynamically significant stenosis.  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 - Keep on telemetry - Continue Xarelto  and Plavix  - Per discussion with neurology, TTE not necessary since patient has been on Xarelto  - PT/OT/SLP eval - Holding home antihypertensives tonight and plan to resume tomorrow - Hemoglobin A1c 5.4%, LDL pending. - Continue rosuvastatin , if LDL >70 consider  change to atorvastatin; continue Zetia    Paroxysmal atrial fibrillation: He is in sinus rhythm with controlled rate.  Continue Xarelto  and resume Toprol -XL tomorrow.   CAD s/p DES to LAD 05/01/2024: Stable, denies chest pain.  Continue Xarelto , Plavix , rosuvastatin , Zetia .   Hypertension: Continue losartan  and Toprol -XL.   Hyperlipidemia: Continue rosuvastatin  and Zetia .  Follow-up   Chronic bronchitis: Continue Breztri  and albuterol  as needed.   GERD: Continue oral Protonix .   OSA: Continue CPAP nightly.  Richard Walker is a 69 year old male who presents for medical weight management.  He was recently in the hospital for transient ischemic attack who presents with concerns about medication  management and recent symptoms of fatigue and facial drooping.   He has been experiencing significant fatigue and facial drooping on the left side, which led to a hospital visit. During his hospitalization, he underwent extensive diagnostic testing, including X-rays, MRI, and CT angiogram.   He denies missing any doses of his blood thinners, Xarelto  and Plavix , which he takes daily. He also takes metoprolol  for atrial fibrillation, which has not been problematic for a long time. He recalls a previous episode two months ago involving slurred speech and difficulty swallowing, initially thought to be medication-related but later ruled out.   He has been on Zepbound , initially at a dose of 7.5 mg, which was increased to 10 mg. Since stopping the medication a week ago, he has noticed changes in his appetite and weight.    He regularly monitors his blood pressure and blood sugar, noting that his blood pressure has been low, with readings around 102/65 mmHg. He is concerned about his blood pressure being too low and mentions taking losartan  for hypertension.       Challenges affecting patient progress: strong hunger signals and/or impaired satiety / inhibitory control and recent hospitalization.    Pharmacotherapy for weight management: He is currently taking : Zepbound  10 mg once a week which was recently held in the hospital.    Assessment and Plan    Treatment Plan For Obesity:   Recommended Dietary Goals   Richard Walker is currently in the action stage of change. As such, his goal is to continue weight management plan. He has agreed to: continue current plan   Behavioral Health and Counseling   We discussed the following behavioral modification strategies today: continue to work on maintaining a reduced calorie state, getting the recommended amount of protein, incorporating whole foods, making healthy choices, staying well hydrated and practicing mindfulness when eating. and increase protein intake,  fibrous foods (25 grams per day for women, 30 grams for men) and water  to improve satiety and decrease hunger signals. .   Additional education and resources provided today   Recommended Physical Activity Goals   Richard Walker has been advised to work up to 150 minutes of moderate intensity aerobic activity a week and strengthening exercises 2-3 times per week for cardiovascular health, weight loss maintenance and preservation of muscle mass.   He has agreed to :  Think about enjoyable ways to increase daily physical activity and overcoming barriers to exercise, Increase physical activity in their day and reduce sedentary time (increase NEAT)., Increase volume of physical activity to a goal of 240 minutes a week, and Combine aerobic and strengthening exercises for efficiency and improved cardiometabolic health.   Medical Interventions and Pharmacotherapy   We discussed various medication options to help Pate with his weight loss efforts and we both agreed to : We discussed  that his symptoms are consistent with a TIA blood sugars were normal upon arrival.  He has multiple cardiovascular risk and is on several medications to mitigate this.  He may continue treatment with GLP-1   Associated Conditions Impacted by Obesity Treatment   Assessment & Plan OSA on CPAP Patient reports intermittent use of CPAP he has lost an amount of weight and does not feel he needs it anymore he reports sleeping well and is asymptomatic.  Consider repeating sleep study to see if he still needs PAP therapy considering comorbidities. Class 1 obesity with serious comorbidity and body mass index (BMI) of 32.0 to 32.9 in adult, unspecified obesity type Weight: decrease of 50 lb (18.7%) over 11 months, 2 weeks  Start: 09/14/2023 267 lb (121.1 kg)  End: 08/29/2024 217 lb (98.4 kg)  Recent hospitalization for symptoms suggestive of a transient ischemic attack (TIA), likely related to cardiovascular risk factors rather than medication.  Zepbound  was paused due to concerns about potential side effects, but no direct link to the TIA was established.  He does report some fatigue which I think is related to blood pressure fluctuations noted, possibly related to medication and weight loss. - Restart Zepbound  at 10 mg dose. - Monitor blood pressure and blood sugar levels regularly. - Hold losartan  to prevent hypotension.   Metabolic dysfunction-associated steatotic liver disease (MASLD) He has hepatic steatosis on CT scan completed in January of this year.   Fibrosis 4 Score = 1.3     Review of Systems: Out of a complete 14 system review, the patient complains of only the following symptoms, and all other reviewed systems are negative.:   SLEEPINESS ?  How likely are you to doze in the following situations: 0 = not likely, 1 = slight chance, 2 = moderate chance, 3 = high chance  Sitting and Reading? Watching Television? Sitting inactive in a public place (theater or meeting)? Lying down in the afternoon when circumstances permit? Sitting and talking to someone? Sitting quietly after lunch without alcohol? In a car, while stopped for a few minutes in traffic? As a passenger in a car for an hour without a break?  Total = 8 FSS 35/ 63        Social History   Socioeconomic History   Marital status: Married    Spouse name: Writer   Number of children: 4   Years of education: Not on file   Highest education level: Associate degree: occupational, scientist, product/process development, or vocational program  Occupational History   Occupation: retired  Tobacco Use   Smoking status: Former    Current packs/day: 0.00    Average packs/day: 1 pack/day for 10.0 years (10.0 ttl pk-yrs)    Types: Cigarettes    Start date: 61    Quit date: 1992    Years since quitting: 34.1    Passive exposure: Past   Smokeless tobacco: Never   Tobacco comments:    Uses Hookah Pipe occasionally  Vaping Use   Vaping status: Never Used  Substance and  Sexual Activity   Alcohol use: No   Drug use: No   Sexual activity: Yes  Other Topics Concern   Not on file  Social History Narrative   Marital Status: Married (Feryal)Children:  4 (3 Sons/1 Daughter)Pets:  None Living Situation: Lives with spouse and 4 childrenOrigin:  He was born in [place].Occupation: Production Designer, 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  Alcohol Use:  NoneDrug Use:  NoneDiet:  RegularExercise:  NoneHobbies:  Cards   Retired    Social Drivers of Health   Tobacco Use: Medium Risk (09/27/2024)   Patient History    Smoking Tobacco Use: Former    Smokeless Tobacco Use: Never    Passive Exposure: Past  Physicist, Medical Strain: High Risk (06/11/2024)   Overall Financial Resource Strain (CARDIA)    Difficulty of Paying Living Expenses: Hard  Food Insecurity: Food Insecurity Present (08/22/2024)   Epic    Worried About Programme Researcher, Broadcasting/film/video in the Last Year: Sometimes true    Ran Out of Food in the Last Year: Sometimes true  Transportation Needs: No Transportation Needs (08/22/2024)   Epic    Lack of Transportation (Medical): No    Lack of Transportation (Non-Medical): No  Physical Activity: Insufficiently Active (06/11/2024)   Exercise Vital Sign    Days of Exercise per Week: 2 days    Minutes of Exercise per Session: 30 min  Stress: Stress Concern Present (06/11/2024)   [employer] of Occupational Health - Occupational Stress Questionnaire    Feeling of Stress: To some extent  Social Connections: Socially Integrated (08/22/2024)   Social Connection and Isolation Panel    Frequency of Communication with Friends and Family: More than three times a week    Frequency of Social Gatherings with Friends and Family: Once a week    Attends Religious Services: 1 to 4 times per year    Active Member of Clubs or Organizations: Yes    Attends Banker  Meetings: 1 to 4 times per year    Marital Status: Married  Depression (PHQ2-9): Medium Risk (06/12/2024)   Depression (PHQ2-9)    PHQ-2 Score: 9  Alcohol Screen: Low Risk (01/17/2024)   Alcohol Screen    Last Alcohol Screening Score (AUDIT): 0  Housing: High Risk (08/22/2024)   Epic    Unable to Pay for Housing in the Last Year: Yes    Number of Times Moved in the Last Year: 0    Homeless in the Last Year: No  Utilities: Not At Risk (08/22/2024)   Epic    Threatened with loss of utilities: No  Health Literacy: Adequate Health Literacy (01/18/2024)   B1300 Health Literacy    Frequency of need for help with medical instructions: Never    Family History  Problem Relation Age of Onset   Stroke Mother    Heart disease Mother    High Cholesterol Mother    Sleep apnea Father    Hypertension Father    Food Allergy  Son    Colon cancer Neg Hx    Stomach cancer Neg Hx    Allergic rhinitis Neg Hx    Angioedema Neg Hx    Asthma Neg Hx    Eczema Neg Hx    Immunodeficiency Neg Hx    Urticaria Neg Hx     Past Medical History:  Diagnosis Date   Arthritis    DDD (degenerative disc disease), lumbar    L3/L4   Diverticulosis    Dysrhythmia    SVT s/p ablation   Dysrhythmia    PAF   GERD (gastroesophageal reflux disease)    Hyperlipidemia    Low sperm motility    Myocardial infarction (HCC)    Pre-diabetes    Sleep apnea    uses cpap    SVT (supraventricular tachycardia)     Past Surgical History:  Procedure Laterality Date   AMPUTATION Left 07/15/2018   Procedure: . REPAIR  OF LEFT INDEX AND LONG FINGER BY FUSION /  REPAIR OF RING FINGER;  Surgeon: Sebastian Lenis, MD;  [facility] OR;  Service: Orthopedics;  Laterality: Left;   BACK SURGERY     BLALOCK PROCEDURE     Ablation   CARDIAC CATHETERIZATION N/A 12/22/2015   Procedure: Left Heart Cath and Coronary Angiography;  Surgeon: Peter M Jordan, MD;  Location: [facility] INVASIVE CV LAB;  Service: Cardiovascular;  Laterality:  N/A;   CARDIAC ELECTROPHYSIOLOGY STUDY AND ABLATION     CORONARY STENT INTERVENTION N/A 05/01/2024   Procedure: CORONARY STENT INTERVENTION;  Surgeon: Ladona Heinz, MD;  Location: [facility] INVASIVE CV LAB;  Service: Cardiovascular;  Laterality: N/A;   ELECTROPHYSIOLOGIC STUDY N/A 02/19/2016   Procedure: SVT Ablation;  Surgeon: Danelle LELON Birmingham, MD;  Location[facility] INVASIVE CV LAB;  Service: Cardiovascular;  Laterality: N/A;   ESOPHAGOGASTRODUODENOSCOPY N/A 06/25/2024   Procedure: EGD (ESOPHAGOGASTRODUODENOSCOPY);  Surgeon: Albertus Heinz HERO, MD;  Location: [facility] ENDOSCOPY;  Service: Gastroenterology;  Laterality: N/A;   HAND SURGERY     LEFT HEART CATH AND CORONARY ANGIOGRAPHY N/A 05/01/2024   Procedure: LEFT HEART CATH AND CORONARY ANGIOGRAPHY;  Surgeon: Ladona Heinz, MD;  Location: [facility] INVASIVE CV LAB;  Service: Cardiovascular;  Laterality: N/A;   septal reconstruction  2017   TOTAL KNEE ARTHROPLASTY Left 03/27/2024   Procedure: ARTHROPLASTY, KNEE, TOTAL, LEFT;  Surgeon: Addie Cordella Hamilton, MD;  Location: [facility] OR;  Service: Orthopedics;  Laterality: Left;   TURBINATE REDUCTION Bilateral 07/2016   WISDOM TOOTH EXTRACTION       Medications Ordered Prior to Encounter[1]  Allergies[2]   DIAGNOSTIC DATA (LABS, IMAGING, TESTING) - I reviewed patient records, labs, notes, testing and imaging myself where available.  Lab Results  Component Value Date   WBC 5.8 08/29/2024   HGB 14.5 08/29/2024   HCT 43.7 08/29/2024   MCV 82.9 08/29/2024   PLT 202.0 08/29/2024      Component Value Date/Time   NA 137 08/29/2024 0916   NA 138 04/26/2024 1232   K 4.1 08/29/2024 0916   CL 104 08/29/2024 0916   CO2 27 08/29/2024 0916   GLUCOSE 89 08/29/2024 0916   BUN 24 (H) 08/29/2024 0916   BUN 14 04/26/2024 1232   CREATININE 0.96 08/29/2024 0916   CREATININE 1.02 06/27/2024 1534   CALCIUM  9.5 08/29/2024 0916   PROT 6.5 08/29/2024 0916   PROT 6.9 11/03/2023 1117   ALBUMIN 4.1 08/29/2024 0916   ALBUMIN 4.3 11/03/2023 1117    AST 14 08/29/2024 0916   ALT 11 08/29/2024 0916   ALKPHOS 60 08/29/2024 0916   BILITOT 0.5 08/29/2024 0916   BILITOT 0.3 11/03/2023 1117   GFRNONAA >60 08/23/2024 0649   GFRNONAA >89 03/04/2014 1456   GFRAA >60 07/15/2018 1324   GFRAA >89 03/04/2014 1456   Lab Results  Component Value Date   CHOL 120 06/12/2024   HDL 39.50 06/12/2024   LDLCALC 61 06/12/2024   LDLDIRECT 40 08/22/2024   TRIG 98.0 06/12/2024   CHOLHDL 3 06/12/2024   Lab Results  Component Value Date   HGBA1C 5.4 08/29/2024   Lab Results  Component Value Date   VITAMINB12 323 08/29/2024   Lab Results  Component Value Date   TSH 1.81 01/20/2024    PHYSICAL EXAM:  Vitals:   09/27/24 0835  BP: 139/87  Pulse: 76   No data found. Body mass index is 33.6 kg/m.   Wt Readings from Last 3 Encounters:  09/27/24 221 lb (100.2 kg)  09/25/24 217 lb (98.4 kg)  08/29/24 217 lb (98.4 kg)     Ht Readings from Last 3 Encounters:  09/27/24 5' 8 (1.727 m)  09/25/24 5' 8 (1.727 m)  08/29/24 5' 8 (1.727 m)      General: The patient is awake, alert and appears not in acute distress and groomed. Head: Normocephalic, atraumatic.  Neck is supple.  Facial hair . Nasal airflow  patent.   Overbite Richard Walker is not  seen.  Dental status: irregular  Cardiovascular:  Regular rate and cardiac rhythm by pulse,  without distended neck veins.  Respiratory: Lungs are clear to auscultation.  Skin:  Without evidence of ankle edema, or rash. Trunk: The patient's posture is erect.   NEUROLOGIC EXAM: Alert oriented to time, place, history taking. Follows all commands speech and language fluent Cranial nerve :  Lost smell and taste for abut 7 days. Extraocular movements were full, visual field were full on confrontational test. Facial sensation and strength were normal.    Neck circumference 18.00 inches ( lost 1 inch neck size. , Mallampati 3+ . Uvula and tongue in midline. Neck ROM and shoulder shrug  were normal  and symmetric. Motor: Symmetric motor tone is noted throughout.    Sensory: Sensory testing is intact to soft touch on all 4 extremities. Coordination:  good finger-nose- bilaterally.  Gait and station: Gait is no longer  affected by  knee pain.  No limp.  Babinski response was deferred  .  ASSESSMENT AND PLAN :   69 y.o. year old male  here with:    1) recent TIA, transient facial droop, completely resolved, will stay on statin, Plavix  and  CPAP.   2) OSA on CPAP - after 50 pounds weight loss, he may need a new setting, he is due for a new machine ,and likely needs much lower pressures now.   Plan : Ordered HST, repeat.  I like to follow up with in house study if here is no apnea.    I would like to thank Saguier, [place], PA-C for allowing me to meet with this pleasant patient.   Sleep Clinic Patients are generally offered input on sleep hygiene, life style changes and how to improve compliance with medical treatment where applicable. Review and reiteration of good sleep hygiene measures is offered to any sleep clinic patient, be it in the first consultation or with any follow up visits.    Any patient with sleepiness should be cautioned not to drive, work at heights, or operate dangerous or heavy equipment when feeling tired or sleepy.      The patient will be seen in follow-up in the sleep clinic at [facility] for discussion of test results, sleep related symptoms and treatment compliance review, further management strategies, etc.   The referring provider will be notified of the test results.   The patient's condition requires frequent monitoring and adjustments in the treatment plan, reflecting the ongoing complexity of care.  This provider is the continuing focal point for all needed services for this condition.  After spending a total time of  35  minutes face to face and time for  history taking, physical and neurologic examination, review of laboratory studies,  personal  review of imaging studies, reports and results of other testing and review of referral information / records as far as provided in visit,   Electronically signed by: Dedra Gores, MD 09/27/2024 8:41 AM  [facility] Board certified by [employer]  of Sleep Medicine and Diplomate of the American Academy of Sleep Medicine. Board certified In Neurology through the ABPN, Fellow of the [employer] of Neurology.      [1]  Current Outpatient Medications on File Prior to Visit  Medication Sig Dispense Refill   acetaminophen  (TYLENOL ) 325 MG tablet Take 2 tablets (650 mg total) by mouth every 4 (four) hours as needed for mild pain (pain score 1-3) or fever (or temp > 37.5 C (99.5 F)).     albuterol  (PROAIR  HFA) 108 (90 Base) MCG/ACT inhaler Inhale 1-2 puffs into the lungs every 6 (six) hours as needed for wheezing or shortness of breath. 24 g 3   azelastine  (ASTELIN ) 0.1 % nasal spray Place 2 sprays into both nostrils 2 (two) times daily as needed for allergies. 90 mL 1   budesonide -glycopyrrolate -formoterol  (BREZTRI  AEROSPHERE) 160-9-4.8 MCG/ACT AERO inhaler Inhale 2 puffs into the lungs in the morning and at bedtime. 10.7 g 10   cetirizine  (ZYRTEC  ALLERGY ) 10 MG tablet Take 1 tablet (10 mg total) by mouth daily as needed for allergies. 100 tablet 1   cholecalciferol  (VITAMIN D3) 25 MCG (1000 UNIT) tablet Take 3,000-4,000 Units by mouth daily.     clopidogrel  (PLAVIX ) 75 MG tablet Take 1 tablet (75 mg total) by mouth daily. 90 tablet 3   docusate sodium  (COLACE) 100 MG capsule Take 1 capsule (100 mg total) by mouth 2 (two) times daily. 30 capsule 0   ezetimibe  (ZETIA ) 10 MG tablet TAKE 1 TABLET(10 MG) BY MOUTH DAILY 90 tablet 3   fenofibrate  160 MG tablet Take 1 tablet (160 mg total) by mouth daily. 90 tablet 2   gabapentin  (NEURONTIN ) 100 MG capsule Take 1 capsule (100 mg total) by mouth 2 (two) times daily. 42 capsule 0   Magnesium  250 MG TABS Take  250 mg by mouth once a week.     metFORMIN  (GLUCOPHAGE ) 500 MG tablet Take 1 tablet (500 mg total) by mouth 2 (two) times daily with a meal. (Patient taking differently: Take 500 mg by mouth daily.) 180 tablet 3   metoprolol  succinate (TOPROL -XL) 50 MG 24 hr tablet Take 1 tablet (50 mg total) by mouth at bedtime. Take with or immediately following a meal. 90 tablet 3   pantoprazole  (PROTONIX ) 40 MG tablet Take 1 tablet (40 mg total) by mouth daily. 90 tablet 0   polyethylene glycol (MIRALAX  / GLYCOLAX ) 17 g packet Take 17 g by mouth daily as needed for mild constipation.     rivaroxaban  (XARELTO ) 20 MG TABS tablet Take 1 tablet (20 mg total) by mouth daily with supper. 90 tablet 2   rosuvastatin  (CRESTOR ) 20 MG tablet Take 1 tablet (20 mg total) by mouth daily. 90 tablet 3   sildenafil  (VIAGRA ) 100 MG tablet Take 1 tablet (100 mg total) by mouth daily as needed for erectile dysfunction. 30 tablet 3   tirzepatide  (ZEPBOUND ) 10 MG/0.5ML Pen Inject 10 mg into the skin once a week. 2 mL 0   Vitamin D , Ergocalciferol , (DRISDOL ) 1.25 MG (50000 UNIT) CAPS capsule Take 1 capsule (50,000 Units total) by mouth every 7 (seven) days. 12 capsule 3   zonisamide  (ZONEGRAN ) 25 MG capsule Take 1 capsule (25 mg total) by mouth daily. 30 capsule 1   No current facility-administered medications on file prior to visit.  [2]  Allergies Allergen Reactions   Nitroglycerin  Other (See Comments)    Tongue swelling and difficulty swallowing. Hospitalized and underwent EGD in 06/2024   Dust Mite Extract  Statins     Muscle aches   Tree Extract Other (See Comments)    Intolerance Grass   "

## 2024-09-27 NOTE — Patient Instructions (Addendum)
 ASSESSMENT AND PLAN :    69 y.o. year old male  here with:     1) recent TIA, transient facial droop, completely resolved, will stay on statin, Plavix  and  CPAP.    2) OSA on CPAP - after 50 pounds weight loss, he may need a new setting, he is due for a new machine ,and likely needs much lower pressures now.    Plan : Ordered HST, repeat.  I like to follow up with in house study if here is no apnea.    Rv in 3-4 months      I would like to thank Saguier, [place], PA-C for allowing me to meet with this pleasant patient.     Sleep Apnea: How to Manage Sleep apnea is a condition that affects your breathing while you're sleeping. Your tongue or the tissue in your throat may block the flow of air while you sleep. You may have shallow breathing or stop breathing for short periods of time. The breaks in breathing interrupt the deep sleep that you need to feel rested. Even if you don't wake up from the gaps in breathing, you may feel tired during the day. People with sleep apnea may snore loudly. You may have a headache in the morning and feel anxious or depressed. How can sleep apnea affect me? Sleep apnea increases your chances of being very tired during the day. This is called daytime fatigue. Sleep apnea can also increase your risk of: Heart attack. Stroke. Obesity. Type 2 diabetes. Heart failure. Irregular heartbeat. High blood pressure. If you are very tired during the day, you may be more likely to: Not do well in school or at work. Fall asleep while driving. Have trouble paying attention. Develop depression or anxiety. Have problems having sex. This is called sexual dysfunction. What actions can I take to manage sleep apnea? Sleep apnea treatment  If you were given a device to open your airway while you sleep, use it only as told by your health care provider. You may be given: An oral appliance. This is a mouthpiece that shifts your lower jaw forward. A continuous positive  airway pressure (CPAP) device. This blows air through a mask. A nasal expiratory positive airway pressure (EPAP) device. This has valves that you put into each nostril. A bi-level positive airway pressure (BIPAP) device. This blows air through a mask when you breathe in and breathe out. You may need surgery if other treatments don't work for you. Sleep habits Go to sleep and wake up at the same time every day. This helps set your internal clock for sleeping. If you stay up later than usual on weekends, try to get up in the morning within 2 hours of the time you usually wake up. Try to get at least 7-9 hours of sleep each night. Stop using a computer, tablet, and mobile phone a few hours before bedtime. Do not take long naps during the day. If you nap, limit it to 30 minutes. Have a relaxing bedtime routine. Reading or listening to music may relax you and help you sleep. Use your bedroom only for sleep. Keep your television and computer out of your bedroom. Keep your bedroom cool, dark, and quiet. Use a supportive mattress and pillows. Follow your provider's instructions for other changes to sleep habits. Nutrition Do not eat big meals in the evening. Do not have caffeine  in the later part of the day. The effects of caffeine  can last for more than 5 hours.  Follow your provider's instructions for any changes to what you eat and drink. Lifestyle Do not drink alcohol before bedtime. Alcohol can cause you to fall asleep at first, but then it can cause you to wake up in the middle of the night and have trouble getting back to sleep. Do not smoke, vape, or use nicotine or tobacco. Medicines Take over-the-counter and prescription medicines only as told by your provider. Do not use over-the-counter sleep medicine. You may become dependent on this medicine, and it can make sleep apnea worse. Do not take medicines, such as sedatives and narcotics, unless told to by your provider. Activity Exercise  on most days, but avoid exercising in the evening. Exercising near bedtime can interfere with sleeping. If possible, spend time outside every day. Natural light helps with your internal clock. General information Lose weight if you need to. Stay at a healthy weight. If you are having surgery, make sure to tell your provider that you have sleep apnea. You may need to bring your device with you. Keep all follow-up visits. Your provider will want to check on your condition. Where to find more information [employer]: [email or web address] This information is not intended to replace advice given to you by your health care provider. Make sure you discuss any questions you have with your health care provider. Document Revised: 06/19/2024 Document Reviewed: 12/01/2022 [employer] Patient Education  2025 [employer].

## 2024-10-05 ENCOUNTER — Ambulatory Visit: Payer: Medicare (Managed Care) | Admitting: Orthopedic Surgery

## 2024-10-23 ENCOUNTER — Ambulatory Visit (INDEPENDENT_AMBULATORY_CARE_PROVIDER_SITE_OTHER): Payer: Medicare (Managed Care) | Admitting: Internal Medicine

## 2024-10-29 ENCOUNTER — Ambulatory Visit: Payer: Medicare (Managed Care) | Admitting: Neurology

## 2024-10-30 ENCOUNTER — Ambulatory Visit: Payer: Medicare (Managed Care) | Admitting: Allergy and Immunology

## 2024-11-27 ENCOUNTER — Ambulatory Visit (INDEPENDENT_AMBULATORY_CARE_PROVIDER_SITE_OTHER): Payer: Medicare (Managed Care) | Admitting: Internal Medicine

## 2024-12-04 ENCOUNTER — Ambulatory Visit: Payer: Medicare (Managed Care) | Admitting: Cardiology

## 2025-01-22 ENCOUNTER — Ambulatory Visit: Payer: Medicare (Managed Care)
